# Patient Record
Sex: Female | Born: 1935 | Race: White | Hispanic: No | Smoking: Former smoker
Health system: Southern US, Community
[De-identification: ages and names within clinical notes are randomized; demographics above are authoritative.]

## PROBLEM LIST (undated history)

## (undated) DIAGNOSIS — I10 Essential (primary) hypertension: Secondary | ICD-10-CM

## (undated) DIAGNOSIS — R197 Diarrhea, unspecified: Secondary | ICD-10-CM

## (undated) DIAGNOSIS — M19011 Primary osteoarthritis, right shoulder: Secondary | ICD-10-CM

## (undated) DIAGNOSIS — E669 Obesity, unspecified: Secondary | ICD-10-CM

## (undated) DIAGNOSIS — M25569 Pain in unspecified knee: Secondary | ICD-10-CM

## (undated) DIAGNOSIS — L408 Other psoriasis: Secondary | ICD-10-CM

## (undated) DIAGNOSIS — D649 Anemia, unspecified: Secondary | ICD-10-CM

## (undated) DIAGNOSIS — M4120 Other idiopathic scoliosis, site unspecified: Secondary | ICD-10-CM

## (undated) DIAGNOSIS — IMO0001 Reserved for inherently not codable concepts without codable children: Secondary | ICD-10-CM

## (undated) DIAGNOSIS — M1990 Unspecified osteoarthritis, unspecified site: Secondary | ICD-10-CM

## (undated) DIAGNOSIS — E039 Hypothyroidism, unspecified: Secondary | ICD-10-CM

## (undated) DIAGNOSIS — D126 Benign neoplasm of colon, unspecified: Secondary | ICD-10-CM

## (undated) DIAGNOSIS — E785 Hyperlipidemia, unspecified: Secondary | ICD-10-CM

## (undated) DIAGNOSIS — R609 Edema, unspecified: Secondary | ICD-10-CM

## (undated) DIAGNOSIS — G5600 Carpal tunnel syndrome, unspecified upper limb: Secondary | ICD-10-CM

## (undated) DIAGNOSIS — M542 Cervicalgia: Secondary | ICD-10-CM

## (undated) DIAGNOSIS — L02519 Cutaneous abscess of unspecified hand: Secondary | ICD-10-CM

## (undated) DIAGNOSIS — H6120 Impacted cerumen, unspecified ear: Secondary | ICD-10-CM

## (undated) DIAGNOSIS — K219 Gastro-esophageal reflux disease without esophagitis: Secondary | ICD-10-CM

## (undated) DIAGNOSIS — R112 Nausea with vomiting, unspecified: Secondary | ICD-10-CM

## (undated) DIAGNOSIS — N3941 Urge incontinence: Secondary | ICD-10-CM

## (undated) DIAGNOSIS — E1165 Type 2 diabetes mellitus with hyperglycemia: Secondary | ICD-10-CM

## (undated) DIAGNOSIS — Z79899 Other long term (current) drug therapy: Secondary | ICD-10-CM

## (undated) DIAGNOSIS — I4891 Unspecified atrial fibrillation: Secondary | ICD-10-CM

## (undated) DIAGNOSIS — J4489 Other specified chronic obstructive pulmonary disease: Secondary | ICD-10-CM

## (undated) DIAGNOSIS — K649 Unspecified hemorrhoids: Secondary | ICD-10-CM

## (undated) DIAGNOSIS — L03019 Cellulitis of unspecified finger: Secondary | ICD-10-CM

## (undated) DIAGNOSIS — M545 Low back pain, unspecified: Secondary | ICD-10-CM

## (undated) DIAGNOSIS — T7840XA Allergy, unspecified, initial encounter: Secondary | ICD-10-CM

## (undated) DIAGNOSIS — Z9889 Other specified postprocedural states: Secondary | ICD-10-CM

## (undated) DIAGNOSIS — N6019 Diffuse cystic mastopathy of unspecified breast: Secondary | ICD-10-CM

## (undated) DIAGNOSIS — Z952 Presence of prosthetic heart valve: Secondary | ICD-10-CM

## (undated) DIAGNOSIS — R011 Cardiac murmur, unspecified: Secondary | ICD-10-CM

## (undated) DIAGNOSIS — I509 Heart failure, unspecified: Secondary | ICD-10-CM

## (undated) DIAGNOSIS — J449 Chronic obstructive pulmonary disease, unspecified: Secondary | ICD-10-CM

## (undated) DIAGNOSIS — J45909 Unspecified asthma, uncomplicated: Secondary | ICD-10-CM

## (undated) DIAGNOSIS — Z7901 Long term (current) use of anticoagulants: Secondary | ICD-10-CM

## (undated) HISTORY — DX: Hyperlipidemia, unspecified: E78.5

## (undated) HISTORY — DX: Pain in unspecified knee: M25.569

## (undated) HISTORY — DX: Carpal tunnel syndrome, unspecified upper limb: G56.00

## (undated) HISTORY — DX: Reserved for inherently not codable concepts without codable children: IMO0001

## (undated) HISTORY — DX: Obesity, unspecified: E66.9

## (undated) HISTORY — DX: Diarrhea, unspecified: R19.7

## (undated) HISTORY — PX: BREAST EXCISIONAL BIOPSY: SUR124

## (undated) HISTORY — DX: Hypothyroidism, unspecified: E03.9

## (undated) HISTORY — DX: Chronic obstructive pulmonary disease, unspecified: J44.9

## (undated) HISTORY — DX: Unspecified osteoarthritis, unspecified site: M19.90

## (undated) HISTORY — DX: Essential (primary) hypertension: I10

## (undated) HISTORY — DX: Benign neoplasm of colon, unspecified: D12.6

## (undated) HISTORY — PX: TRIGGER FINGER RELEASE: SHX641

## (undated) HISTORY — DX: Cervicalgia: M54.2

## (undated) HISTORY — PX: BREAST SURGERY: SHX581

## (undated) HISTORY — DX: Other psoriasis: L40.8

## (undated) HISTORY — DX: Type 2 diabetes mellitus with hyperglycemia: E11.65

## (undated) HISTORY — DX: Low back pain, unspecified: M54.50

## (undated) HISTORY — DX: Edema, unspecified: R60.9

## (undated) HISTORY — DX: Unspecified hemorrhoids: K64.9

## (undated) HISTORY — DX: Unspecified asthma, uncomplicated: J45.909

## (undated) HISTORY — DX: Cutaneous abscess of unspecified hand: L02.519

## (undated) HISTORY — DX: Anemia, unspecified: D64.9

## (undated) HISTORY — DX: Cellulitis of unspecified finger: L03.019

## (undated) HISTORY — DX: Unspecified atrial fibrillation: I48.91

## (undated) HISTORY — DX: Urge incontinence: N39.41

## (undated) HISTORY — DX: Low back pain: M54.5

## (undated) HISTORY — DX: Other specified chronic obstructive pulmonary disease: J44.89

## (undated) HISTORY — DX: Diffuse cystic mastopathy of unspecified breast: N60.19

## (undated) HISTORY — DX: Other long term (current) drug therapy: Z79.899

## (undated) HISTORY — PX: OTHER SURGICAL HISTORY: SHX169

## (undated) HISTORY — DX: Allergy, unspecified, initial encounter: T78.40XA

## (undated) HISTORY — PX: COLON SURGERY: SHX602

## (undated) HISTORY — DX: Other idiopathic scoliosis, site unspecified: M41.20

## (undated) HISTORY — DX: Impacted cerumen, unspecified ear: H61.20

## (undated) HISTORY — DX: Long term (current) use of anticoagulants: Z79.01

## (undated) HISTORY — PX: BREAST BIOPSY: SHX20

## (undated) HISTORY — DX: Cardiac murmur, unspecified: R01.1

---

## 1965-10-27 HISTORY — PX: OTHER SURGICAL HISTORY: SHX169

## 1985-10-27 HISTORY — PX: ABDOMINAL HYSTERECTOMY: SHX81

## 1985-11-27 HISTORY — PX: OTHER SURGICAL HISTORY: SHX169

## 1987-10-28 HISTORY — PX: FOOT SURGERY: SHX648

## 1990-10-27 HISTORY — PX: CHOLECYSTECTOMY: SHX55

## 1998-12-28 ENCOUNTER — Ambulatory Visit (HOSPITAL_BASED_OUTPATIENT_CLINIC_OR_DEPARTMENT_OTHER): Admission: RE | Admit: 1998-12-28 | Discharge: 1998-12-28 | Payer: Self-pay | Admitting: Otolaryngology

## 2000-05-01 ENCOUNTER — Other Ambulatory Visit: Admission: RE | Admit: 2000-05-01 | Discharge: 2000-05-01 | Payer: Self-pay | Admitting: Internal Medicine

## 2001-04-30 ENCOUNTER — Encounter: Admission: RE | Admit: 2001-04-30 | Discharge: 2001-04-30 | Payer: Self-pay | Admitting: Orthopedic Surgery

## 2001-04-30 ENCOUNTER — Encounter: Payer: Self-pay | Admitting: Orthopedic Surgery

## 2001-09-22 ENCOUNTER — Encounter: Payer: Self-pay | Admitting: Orthopedic Surgery

## 2001-09-28 ENCOUNTER — Observation Stay (HOSPITAL_COMMUNITY): Admission: RE | Admit: 2001-09-28 | Discharge: 2001-09-29 | Payer: Self-pay | Admitting: Orthopedic Surgery

## 2002-03-01 ENCOUNTER — Encounter: Admission: RE | Admit: 2002-03-01 | Discharge: 2002-03-01 | Payer: Self-pay | Admitting: Internal Medicine

## 2002-03-01 ENCOUNTER — Encounter: Payer: Self-pay | Admitting: Internal Medicine

## 2002-04-26 ENCOUNTER — Encounter: Payer: Self-pay | Admitting: Orthopedic Surgery

## 2002-04-26 ENCOUNTER — Encounter: Admission: RE | Admit: 2002-04-26 | Discharge: 2002-04-26 | Payer: Self-pay | Admitting: Orthopedic Surgery

## 2002-12-05 ENCOUNTER — Ambulatory Visit (HOSPITAL_COMMUNITY): Admission: RE | Admit: 2002-12-05 | Discharge: 2002-12-05 | Payer: Self-pay | Admitting: *Deleted

## 2002-12-05 ENCOUNTER — Encounter (INDEPENDENT_AMBULATORY_CARE_PROVIDER_SITE_OTHER): Payer: Self-pay | Admitting: Specialist

## 2002-12-11 ENCOUNTER — Inpatient Hospital Stay (HOSPITAL_COMMUNITY): Admission: EM | Admit: 2002-12-11 | Discharge: 2002-12-13 | Payer: Self-pay | Admitting: Emergency Medicine

## 2002-12-14 ENCOUNTER — Inpatient Hospital Stay (HOSPITAL_COMMUNITY): Admission: EM | Admit: 2002-12-14 | Discharge: 2002-12-16 | Payer: Self-pay | Admitting: *Deleted

## 2003-03-06 ENCOUNTER — Encounter: Admission: RE | Admit: 2003-03-06 | Discharge: 2003-03-06 | Payer: Self-pay | Admitting: Internal Medicine

## 2003-03-06 ENCOUNTER — Encounter: Payer: Self-pay | Admitting: Internal Medicine

## 2003-05-28 HISTORY — PX: NEUROPLASTY / TRANSPOSITION MEDIAN NERVE AT CARPAL TUNNEL BILATERAL: SUR894

## 2004-03-27 ENCOUNTER — Encounter: Admission: RE | Admit: 2004-03-27 | Discharge: 2004-03-27 | Payer: Self-pay | Admitting: Internal Medicine

## 2005-05-02 ENCOUNTER — Encounter: Admission: RE | Admit: 2005-05-02 | Discharge: 2005-05-02 | Payer: Self-pay | Admitting: Internal Medicine

## 2006-07-14 ENCOUNTER — Encounter: Admission: RE | Admit: 2006-07-14 | Discharge: 2006-07-14 | Payer: Self-pay | Admitting: Internal Medicine

## 2007-07-19 ENCOUNTER — Encounter: Admission: RE | Admit: 2007-07-19 | Discharge: 2007-07-19 | Payer: Self-pay | Admitting: Internal Medicine

## 2008-09-15 ENCOUNTER — Encounter: Admission: RE | Admit: 2008-09-15 | Discharge: 2008-09-15 | Payer: Self-pay | Admitting: Internal Medicine

## 2009-01-18 ENCOUNTER — Encounter: Admission: RE | Admit: 2009-01-18 | Discharge: 2009-01-18 | Payer: Self-pay | Admitting: Orthopedic Surgery

## 2009-10-09 ENCOUNTER — Encounter: Admission: RE | Admit: 2009-10-09 | Discharge: 2009-10-09 | Payer: Self-pay | Admitting: Internal Medicine

## 2009-10-17 ENCOUNTER — Encounter: Admission: RE | Admit: 2009-10-17 | Discharge: 2009-10-17 | Payer: Self-pay | Admitting: Internal Medicine

## 2009-10-27 HISTORY — PX: BREAST EXCISIONAL BIOPSY: SUR124

## 2009-11-12 ENCOUNTER — Encounter: Admission: RE | Admit: 2009-11-12 | Discharge: 2009-11-12 | Payer: Self-pay | Admitting: Surgery

## 2009-11-12 ENCOUNTER — Ambulatory Visit (HOSPITAL_COMMUNITY): Admission: RE | Admit: 2009-11-12 | Discharge: 2009-11-12 | Payer: Self-pay | Admitting: Surgery

## 2010-01-04 ENCOUNTER — Encounter: Admission: RE | Admit: 2010-01-04 | Discharge: 2010-01-04 | Payer: Self-pay | Admitting: Internal Medicine

## 2010-10-23 ENCOUNTER — Encounter
Admission: RE | Admit: 2010-10-23 | Discharge: 2010-10-23 | Payer: Self-pay | Source: Home / Self Care | Attending: Internal Medicine | Admitting: Internal Medicine

## 2010-11-17 ENCOUNTER — Encounter: Payer: Self-pay | Admitting: Internal Medicine

## 2010-11-27 ENCOUNTER — Encounter: Payer: Self-pay | Admitting: Internal Medicine

## 2011-01-12 LAB — CBC
HCT: 37.1 % (ref 36.0–46.0)
Hemoglobin: 12.6 g/dL (ref 12.0–15.0)
MCHC: 34.1 g/dL (ref 30.0–36.0)
MCV: 87.2 fL (ref 78.0–100.0)
Platelets: 300 10*3/uL (ref 150–400)
RBC: 4.25 MIL/uL (ref 3.87–5.11)
RDW: 14.9 % (ref 11.5–15.5)
WBC: 9.7 10*3/uL (ref 4.0–10.5)

## 2011-01-12 LAB — DIFFERENTIAL
Basophils Absolute: 0.1 10*3/uL (ref 0.0–0.1)
Basophils Relative: 1 % (ref 0–1)
Eosinophils Absolute: 0.2 10*3/uL (ref 0.0–0.7)
Eosinophils Relative: 2 % (ref 0–5)
Lymphocytes Relative: 31 % (ref 12–46)
Lymphs Abs: 3 10*3/uL (ref 0.7–4.0)
Monocytes Absolute: 0.8 10*3/uL (ref 0.1–1.0)
Monocytes Relative: 8 % (ref 3–12)
Neutro Abs: 5.7 10*3/uL (ref 1.7–7.7)
Neutrophils Relative %: 59 % (ref 43–77)

## 2011-01-12 LAB — COMPREHENSIVE METABOLIC PANEL
ALT: 49 U/L — ABNORMAL HIGH (ref 0–35)
AST: 44 U/L — ABNORMAL HIGH (ref 0–37)
Albumin: 4.4 g/dL (ref 3.5–5.2)
Alkaline Phosphatase: 66 U/L (ref 39–117)
BUN: 13 mg/dL (ref 6–23)
CO2: 29 mEq/L (ref 19–32)
Calcium: 10 mg/dL (ref 8.4–10.5)
Chloride: 98 mEq/L (ref 96–112)
Creatinine, Ser: 0.73 mg/dL (ref 0.4–1.2)
GFR calc Af Amer: >60 mL/min (ref >60)
GFR calc non Af Amer: >60 mL/min (ref >60)
Glucose, Bld: 129 mg/dL — ABNORMAL HIGH (ref 70–99)
Potassium: 4.3 mEq/L (ref 3.5–5.1)
Sodium: 141 mEq/L (ref 135–145)
Total Bilirubin: 0.5 mg/dL (ref 0.3–1.2)
Total Protein: 7.7 g/dL (ref 6.0–8.3)

## 2011-01-13 LAB — GLUCOSE, CAPILLARY
Glucose-Capillary: 150 mg/dL — ABNORMAL HIGH (ref 70–99)
Glucose-Capillary: 154 mg/dL — ABNORMAL HIGH (ref 70–99)

## 2011-03-14 NOTE — Op Note (Signed)
   NAME:  Patricia [name], Patricia [name]                          ACCOUNT NO.:  [id number]   MEDICAL RECORD NO.:  [id number]                   PATIENT TYPE:  AMB   LOCATION:  ENDO                                 FACILITY:  [facility]   PHYSICIAN:  Waverly Ferrari, M.D.                 DATE OF BIRTH:  [birth year]   DATE OF PROCEDURE:  12/05/2002  DATE OF DISCHARGE:                                 OPERATIVE REPORT   PROCEDURE:  Upper endoscopy.   INDICATION:  GERD.   ANESTHESIA:  Demerol 80 mg, Versed 7 mg.   DESCRIPTION OF PROCEDURE:  With the patient mildly sedated in the left  lateral decubitus position, the Olympus videoscopic endoscope was inserted  into the mouth and passed under direct vision through the esophagus which  appeared normal until it reached the distal esophagus where there were  changes of Barrett's photographed and biopsied.  When entering the stomach,  the fundus showed changes with multiple polyps that were photographed and  biopsied.  The body and antrum appeared normal, as did the duodenal bulb and  second portion of the duodenum.  From this point, the endoscope was slowly  withdrawn, taking circumferential views of the duodenal mucosa until the  endoscope was pulled into the stomach and placed in retroflexion, viewing  the stomach from below and this showed an incomplete wrap at the GE junction  around the endoscope that was photographed.  The endoscope was straightened  and withdrawn, taking circumferential views of the remaining gastric and  esophageal mucosa.  The patient's vital signs and pulse oximetry remained  stable, and the patient tolerated the procedure well without apparent  complications.   FINDINGS:  Changes with what appears to be Barrett's esophagus, distal  esophagus biopsied and multiple gastric fundic polyps, biopsied, awaiting  biopsy report.   PLAN:  The patient will call me for results and follow up with me as an  outpatient and proceed to colonoscopy as  planned.                                               Waverly Ferrari, M.D.    GMO/MEDQ  D:  12/05/2002  T:  12/05/2002  Job:  [id number]

## 2011-03-14 NOTE — Discharge Summary (Signed)
   NAME:  KEONNA, RAETHER                          ACCOUNT NO.:  [id number]   MEDICAL RECORD NO.:  [id number]                   PATIENT TYPE:  INP   LOCATION:  0372                                 FACILITY:  [facility]   PHYSICIAN:  Waverly Ferrari, M.D.                 DATE OF BIRTH:  1936-08-13   DATE OF ADMISSION:  12/14/2002  DATE OF DISCHARGE:  12/16/2002                                 DISCHARGE SUMMARY   ADMISSION DIAGNOSES:  Gastrointestinal bleeding.   HISTORY OF PRESENT ILLNESS:  The patient gave a history of having two bloody  stools in succession.  Came to the emergency room and was admitted.  Vital  signs were stable.  H&H really was unchanged from her previous admission  from discharge one day previously.  During the hospitalization she had a  normal bowel movement, no abdominal pain, and no further passage of blood  per rectum.  Vital signs and her H&H remained stable and therefore it was  elected to discharge her home with outpatient follow-up.  She was given  octreotide drip overnight from admission but that was discontinued the next  day and she remained stable despite being off that.  At the time of  discharge her vital signs, blood pressure most recently was 154/68, pulse  70, respiratory rate 18.  Her CBGs remained in the 125-180 range.  At the  time of discharge her most recent H&H showed a hemoglobin of 11, hematocrit  of 32.  She was discharged in stable condition with outpatient follow-up  suggested with me already scheduled for March 11.                                               Waverly Ferrari, M.D.    GMO/MEDQ  D:  12/16/2002  T:  12/16/2002  Job:  [id number]

## 2011-03-14 NOTE — Op Note (Signed)
   NAME:  Patricia [name], Patricia [name] NO.:  [id number]   MEDICAL RECORD NO.:  [id number]                   PATIENT TYPE:  AMB   LOCATION:  ENDO                                 FACILITY:  [facility]   PHYSICIAN:  Waverly Ferrari, M.D.                 DATE OF BIRTH:  October 25, 1936   DATE OF PROCEDURE:  DATE OF DISCHARGE:                                 OPERATIVE REPORT                                               Waverly Ferrari, M.D.    GMO/MEDQ  D:  12/05/2002  T:  12/05/2002  Job:  [id number]   cc:   Viviann Spare. Nyoka Cowden, M.D.  [address]  [place]   [address]  Fax: [phone]

## 2011-03-14 NOTE — Op Note (Signed)
   NAME:  Patricia [name], Patricia [name]                          ACCOUNT NO.:  [id number]   MEDICAL RECORD NO.:  [id number]                   PATIENT TYPE:  INP   LOCATION:  0372                                 FACILITY:  [facility]   PHYSICIAN:  Waverly Ferrari, M.D.                 DATE OF BIRTH:  Sep 27, 1936   DATE OF PROCEDURE:  01/24/2003  DATE OF DISCHARGE:  12/16/2002                                 OPERATIVE REPORT   PROCEDURE PERFORMED:  Colonoscopy.   ENDOSCOPIST:  Waverly Ferrari, M.D.   INDICATIONS FOR PROCEDURE:  Colon polyps.   ANESTHESIA:  Demerol 40 mg, Versed 4 mg.   DESCRIPTION OF PROCEDURE:  With the patient mildly sedated in the left  lateral decubitus position, the Olympus video colonoscope was inserted in  the rectum and passed under direct vision to the cecum, identified by the  ileocecal valve and appendiceal orifice, both of which were photographed.  From this point the colonoscope was slowly withdrawn taking circumferential  views of the entire colonic mucosa stopping in the ascending colon where a  small polyp was seen and removed using cautery technique, setting of 20/20  blended current.  Tissue was obtained for pathology and then the colonoscope  was slowly withdrawn taking circumferential views of the remaining colonic  mucosa stopping only in the rectum which appeared normal on direct and  retroflex view.  The endoscope was straightened and withdrawn.  The  patient's vital signs and pulse oximeter remained stable.  The patient  tolerated the procedure well without apparent complications.   FINDINGS:  Ascending colon polyp.   PLAN:  Await pathology report.  Patient will call me for results and follow  up with me as an outpatient.                                                  Waverly Ferrari, M.D.    GMO/MEDQ  D:  01/24/2003  T:  01/24/2003  Job:  [id number]

## 2011-03-14 NOTE — Discharge Summary (Signed)
   NAME:  Patricia [name], Patricia [name] NO.:  [id number]   MEDICAL RECORD NO.:  [id number]                   PATIENT TYPE:  INP   LOCATION:  4825                                 FACILITY:  [facility]   PHYSICIAN:  Waverly Ferrari, M.D.                 DATE OF BIRTH:  1936-10-21   DATE OF ADMISSION:  12/11/2002  DATE OF DISCHARGE:  12/13/2002                                 DISCHARGE SUMMARY   HISTORY:  The patient is a 75 year old lady admitted to the hospital on the  evening of Sunday, 12/11/02, and discharged on 12/13/02.  Chief complaint was  gastrointestinal bleeding.  She had undergone an endoscopy and a colonoscopy  which both showed polyps; the polyp in the colon was in the right side of  the colon.  Her bleeding was bright red in nature and required four units of  blood to be transfused.  She had continued on aspirin inadvertently after  her colonoscopy.  She was admitted to the hospital by Dr. Gatha Mayer,  she was stabilized, and she underwent endoscopy which was negative except  for the previously noted polyps in the fundus and colonoscopy by me,  yesterday 12/12/02, and colonoscopy it was noted that she had a ulcer at the  area of the polypectomy site, as to be expected, but a visible vessel was  noted, it was injected with epinephrine, even though it was not actively  bleeding at the time of the procedure.  The remainder of the colonoscopy was  unremarkable.  The patient remained stable subsequently with no further  bleeding.  She was tolerating a regular diet and therefore she was  discharged home to resume her previous medications which included Cozaar,  Lipitor, Cardizem and Advair Diskus, but we have asked her to hold her  aspirin for another two weeks.  Her polypectomies showed adenomatous polyps  and therefore I suggested a repeat examination in probably 2-3 years, and I  told her to notify her family members of this, the brothers and sisters  should  consider colonoscopy, and children should consider colonoscopy either  at age 1 or for screening of themselves.  The patient was discharged in  improved and stable condition for followup as an outpatient.                                               Waverly Ferrari, M.D.    GMO/MEDQ  D:  12/13/2002  T:  12/13/2002  Job:  [id number]   cc:   Viviann Spare. Nyoka Cowden, M.D.  [address]  [place]  [place] [address]  Fax: [phone]

## 2011-03-14 NOTE — Op Note (Signed)
   NAME:  Patricia [name], Patricia [name]                          ACCOUNT NO.:  [id number]   MEDICAL RECORD NO.:  [id number]                   PATIENT TYPE:  INP   LOCATION:  4008                                 FACILITY:  [facility]   PHYSICIAN:  Waverly Ferrari, M.D.                 DATE OF BIRTH:  06/21/1936   DATE OF PROCEDURE:  DATE OF DISCHARGE:                                 OPERATIVE REPORT   PROCEDURE PERFORMED:  Upper endoscopy.   ENDOSCOPIST:  Waverly Ferrari, M.D.   INDICATIONS FOR PROCEDURE:  GI bleeding.   ANESTHESIA:  Demerol 50 and Versed 6 mg.   DESCRIPTION OF PROCEDURE:  With the patient mildly sedated in the left  lateral decubitus position the Olympus videoscopic endoscope was inserted in  the mouth down to the stomach under direct vision through the esophagus,  which appeared normal into the stomach.  Fundus, body, antrum, duodenal  bulb, and second portion of the duodenum were all visualized.  From this  point the endoscope was slowly withdrawn taking circumferential views of the  duodenal mucosa.  The endoscope was then pulled back into the stomach and  placed in retroflex to view the stomach from below.  The endoscope was then  straightened and withdrawn taking circumferential views of the remaining  gastric and esophageal mucosa.   The patient's vital signs and pulse oximeter remained stable.  The patient  tolerated the procedure well without apparent complications.   FINDINGS:  Unremarkable endoscopic examination.   PLAN:  Proceed to colonoscopy.                                                 Waverly Ferrari, M.D.    GMO/MEDQ  D:  12/12/2002  T:  12/12/2002  Job:  [id number]

## 2011-03-14 NOTE — Op Note (Signed)
   NAME:  Patricia [name], Patricia [name]                          ACCOUNT NO.:  [id number]   MEDICAL RECORD NO.:  [id number]                   PATIENT TYPE:  INP   LOCATION:  7342                                 FACILITY:  [facility]   PHYSICIAN:  Waverly Ferrari, M.D.                 DATE OF BIRTH:  1936/07/16   DATE OF PROCEDURE:  12/12/2002  DATE OF DISCHARGE:                                 OPERATIVE REPORT   PROCEDURE:  Colonoscopy.   INDICATIONS FOR PROCEDURE:  GI bleed.   ANESTHESIA:  Demerol 50 mg, Versed 4 mg additionally.   PROCEDURE:  With the patient mildly sedated in the left lateral decubitus  position, the Olympus videoscopic colonoscope was inserted into the rectum  and passed under direct vision to the cecum identified by ileocecal valve  and appendiceal orifice, both of which were photographed.  We entered into  the terminal ileum which also appeared normal and was photographed.  From  this point, the colonoscope was then slowly withdrawn taking circumferential  views of the remaining colonic mucosa stopping only in the right colon where  a previous polypectomy was identified and there was an ulcer associated with  this as to be expected and with it, a visible vessel was noted.  This was  injected with an mL on either side of the vessel and then an mL on the  proximal and distal position of the ulcer with good blanching.  There was no  bleeding seen at this time.  Therefore, the endoscope was withdrawn taking  circumferential views of the remaining colonic mucosa until the endoscope  had been withdrawn.  The patient's vital signs and pulse oximetry remained  stable.  The patient tolerated the procedure well without apparent  complications.   FINDINGS:  Ulceration of right colon, as described.   PLAN:  Will increase patient's diet and follow up clinically.                                               Waverly Ferrari, M.D.    GMO/MEDQ  D:  12/12/2002  T:  12/12/2002  Job:   [id number]

## 2011-03-14 NOTE — Op Note (Signed)
[facility]  Patient:    Patricia [name], Patricia [name] Visit Number: [id number] MRN: [id number]          Service Type: SUR Location: 4W 4920 02 Attending Physician:  Tarri Glenn Page Dictated by:   Laurice Record. Aplington, M.D. Proc. Date: 09/28/01 Admit Date:  09/28/2001                             Operative Report  PREOPERATIVE DIAGNOSES: 1. Painful degenerative arthritis left acromioclavicular joint. 2. Chronic impingement syndrome with suspected partial rotator cuff tear, left    shoulder.  POSTOPERATIVE DIAGNOSES: 1. Glenohumeral arthritis. 2. Chronic impingement syndrome with partial rotator cuff tear. 3. Painful degenerative arthritis left acromioclavicular joint.  OPERATION: 1. Left shoulder arthroscopy with shaving of humeral head and glenoid. 2. Arthroscopic subacromial decompression. 3. Open resection distal clavicle, left shoulder.  SURGEON:  Laurice Record. Aplington, M.D.  ASSISTANT:  Alexzandrew L. Perkins, P.A.-C.  ANESTHESIA:  General.  PATHOLOGY AND JUSTIFICATION OF PROCEDURE:  Chronic pain in the left shoulder with tendinous at the [facility] joint and also the subacromial area with only partial relief from injection.  She, because of claustrophobia problems, did not want to have an MRI done, and we did get an arthrogram which demonstrated no full-thickness rotator cuff tear.  Because of the continued pain, she is here today for the above-mentioned surgery.  See operative description below for additional pathology and details.  DESCRIPTION OF PROCEDURE:  Satisfactory general anesthesia, preceded by intrascalene block by anesthesiologist.  She was placed on the Schlein frame in the beach chair position.  The left shoulder girdle was prepped with DuraPrep and draped in a sterile field.  The anatomy of the shoulder joint was marked out including the [facility] joint, a lateral portal, and the posterior soft spot.  The lateral portal, posterior soft spot, and  subacromial area, we infiltrated with 0.5% Marcaine with adrenalin, and I then, through a small stab wound in the posterior soft spot with a blunt trocar, was able to atraumatically enter the glenohumeral joint and inspect it.  The long head of the biceps tendon and underneath surface of the rotator cuff looked normal. Her major pathology was a good bit of wear on the humeral head with areas of articular cartilage that were partially detached as well as some gouged out areas of the glenoid.  Because of this, I elected to go ahead and debride up the joint and advanced the scope between the long head of the biceps and subscapularis tendons, used a switching stick, and a small anterior stab wound, and then placed a metal cannula over the switching stick and brought it in the joint anteriorly followed by the 4.2 shaver.  I then shaved down slightly the labrum but mainly the humeral head and smoothing it down as much as possible as well as the glenoid which did not require much in the way of shaving.  After this had been completed, I evacuated fluid from the glenohumeral joint and redirected the scope into the subacromial area, anterolateral portal, introduced the 4.2 shaver.  She had a good bit of bursitis present which I resected with the shaver and once I had adequate visualization, introduced the Arthrocare vaporizer and then began removing the soft tissue on the underneath surface of the acromion which was not particularly beat and around the anterior acromion.  Once I had adequate soft tissue resection, I introduced a 4.0 oval bur and then  began burring down the underneath surface of the acromion until we had wide decompression.  Also shaved down the rotator cuff with the 4.2 shaver.  When the decompression had been completed, I took pictures with the arm to the side and the arm abducted. I then evacuated fluid from this joint as well and made an open incision over the distal clavicle,  exposing it in subperiosteal fashion.  I measured out slightly less than 2 cm from the joint and then undermined the clavicle at this point and protected the underlying structures and with a microsaw, resected the bone at this point.  I then grasped the distal clavicle with towel clip and freed it up with cautery dissection.  Several small bleeders were then coagulated.  I also removed some small spicules of bone on the underneath surface of the parent clavicle followed by bone wax.  I then packed the interval of the bone resection with Gelfoam.  The fascia overlying the clavicle and resected area was then reapproximated with interrupted #1 Vicryl, subcutaneous tissue with 2-0 Vicryl, and Steri-Strips on the skin with 4-0 nylon in the three portals.  Dry sterile dressing and shoulder immobilizer were applied.  She tolerated the procedure well and was taken to the recovery room in satisfactory condition with no known complications. Dictated by:   Laurice Record. Aplington, M.D. Attending Physician:  Clare Gandy DD:  09/28/01 TD:  09/28/01 Job: [id number]

## 2011-03-14 NOTE — H&P (Signed)
NAME:  Patricia, [name] NO.:  [id number]   MEDICAL RECORD NO.:  [id number]                   PATIENT TYPE:  INP   LOCATION:  0102                                 FACILITY:  [facility]   PHYSICIAN:  Gatha Mayer, M.D. [facility]           DATE OF BIRTH:  11-12-1935   DATE OF ADMISSION:  DATE OF DISCHARGE:                                HISTORY & PHYSICAL   CHIEF COMPLAINT:  Rectal bleeding.   HISTORY OF PRESENT ILLNESS:  Patricia [name] is a very nice 75 year old white female  known to Dr. Jeanmarie Hubert and Dr. Lajoyce Corners with history of hypertension,  hyperlipidemia, and asthma who underwent upper endoscopy and colonoscopy  with Dr. Lajoyce Corners on December 05, 2002.  I believe this was done for heme-positive  stool.  She was found to have a single polyp in the right colon which was  removed and had multiple biopsies of small gastric polyps done as well as  additional esophageal biopsy for rule out Barrett's.  The patient did fine  with the procedure and had done well until last p.m.  She says she restarted  her baby aspirin one per day a couple of days ago and reports that she was  not told to stay off aspirin and NSAIDs.  She had onset about 4 a.m. with urgency for bowel movement which woke her  from sleep and then filled the commode with red blood and a few clots.  She  quickly had four to five further episodes at home, became lightheaded, no  syncope, fall, chest pain, shortness of breath, etc.  She called EMS and was  transported to the ER.  On arrival to her home, EMS recorded blood pressure  of 180/80.  She was orthostatic here in the ER and with initial hemoglobin  of 10.3.  She has had six to eight episodes since arrival in the ER and says  that the last episode was less blood and more clotted.  Currently she is  hemodynamically stable, still feeling a bit lightheaded.   CURRENT MEDICATIONS:  1. Baby aspirin one p.o. daily.  2. Cozaar daily.  3. Lipitor daily.  4. Cardizem  daily.  5. Advair Diskus b.i.d.  She is uncertain of the dose of this.   ALLERGIES:  No known drug allergies.   PAST MEDICAL HISTORY:  Pertinent for hypertension, hyperlipidemia, asthma,  breast biopsy x 2, hysterectomy, cholecystectomy, left shoulder repair, and  laminectomy.   FAMILY HISTORY:  A sister deceased with stomach cancer and a sister  deceased with liver cancer.  Family history also pertinent for diabetes and  CVA.   SOCIAL HISTORY:  The patient lives alone.  She is employed full time as a  Market researcher.  She does have one daughter who lives in [place].  No  tobacco or no EtOH.   REVIEW OF SYSTEMS:  CARDIOVASCULAR:  Denies any chest pain or anginal  symptoms.  PULMONARY:  Negative for cough, shortness of breath, sputum  production.  GU:  Negative for discharge, dysuria or frequency.  GI:  As  above.   PHYSICAL EXAMINATION:  GENERAL:  Well-developed white female in no acute  distress.  She is pale, alert and oriented x 3.  VITAL SIGNS:  Blood pressure 130/90, pulse 110.  HEENT:  Atraumatic, normocephalic.  EOMI.  PERRLA.  Sclerae anicteric.  Conjunctivae somewhat pale.  NECK:  Supple without nodes.  CARDIOVASCULAR:  Slightly tachy regular rhythm with S1 and S2.  PULMONARY:  Clear to A&P.  ABDOMEN:  Soft.  Bowel sounds active.  She is mildly tender in the right mid  quadrant.  There is no guarding or rebound.  No mass or hepatosplenomegaly.  RECTAL:  Not done at this time.  Done per ER doctor and grossly bloody.  EXTREMITIES:  No clubbing, cyanosis or edema.  NEUROLOGIC:  Grossly nonfocal.   LABORATORY DATA:  WBC 8.2, hemoglobin 10.4, hematocrit 30.4, MCV 85,  platelets 269.  Glucose 205, albumin 3.4.   IMPRESSION:  61. A 75 year old white female with acute post polypectomy bleed about six     days post colonoscopy and upper endoscopy with major bleed at this time,     orthostasis and anemia.  Suspect source is the right colon polyp site.     She did have  multiple gastric polyps biopsied and biopsy of the distal     esophagus also.  2. History of hypertension.  3. Hyperlipidemia.  4. Status post cholecystectomy, hysterectomy, laminectomy, and left shoulder     repair.  5. Asthma.  6. Rule out diabetes.   PLAN:  The patient is admitted to the service of Dr. Silvano Rusk to  stepdown or telemetry.  Volume replaced, transfused to keep hemoglobin 9,  bed rest, serial H&H.  She may require rapid purging colonoscopy later today  if she continues to actively hemorrhage.  Also note elevated glucose which  was fasting and will repeat fasting glucose in the a.m. and check hemoglobin  A1c.  For details, please see the orders.        Nicoletta Ba, P.A.-C. [facility]                Gatha Mayer, M.D. [facility]    AE/MEDQ  D:  12/11/2002  T:  12/11/2002  Job:  [id number]   cc:   Waverly Ferrari, M.D.  [address] [place]  [place] [address]  Fax: [place]. Nyoka Cowden, M.D.  [address]  [place]  [place] [address]  Fax: [phone]

## 2011-06-11 ENCOUNTER — Other Ambulatory Visit: Payer: Self-pay | Admitting: Internal Medicine

## 2011-06-11 DIAGNOSIS — Z780 Asymptomatic menopausal state: Secondary | ICD-10-CM

## 2011-06-20 ENCOUNTER — Ambulatory Visit
Admission: RE | Admit: 2011-06-20 | Discharge: 2011-06-20 | Disposition: A | Discharge status: Home / Self Care | Payer: Medicare Other | Source: Ambulatory Visit | Attending: Internal Medicine | Admitting: Internal Medicine

## 2011-06-20 DIAGNOSIS — Z780 Asymptomatic menopausal state: Secondary | ICD-10-CM

## 2011-10-08 ENCOUNTER — Other Ambulatory Visit: Payer: Self-pay | Admitting: Internal Medicine

## 2011-10-08 DIAGNOSIS — Z1231 Encounter for screening mammogram for malignant neoplasm of breast: Secondary | ICD-10-CM

## 2011-11-05 ENCOUNTER — Ambulatory Visit: Payer: Medicare Other

## 2011-12-01 DIAGNOSIS — E119 Type 2 diabetes mellitus without complications: Secondary | ICD-10-CM | POA: Diagnosis not present

## 2011-12-09 ENCOUNTER — Ambulatory Visit
Admission: RE | Admit: 2011-12-09 | Discharge: 2011-12-09 | Disposition: A | Discharge status: Home / Self Care | Payer: Medicare Other | Source: Ambulatory Visit | Attending: Internal Medicine | Admitting: Internal Medicine

## 2011-12-09 DIAGNOSIS — Z1231 Encounter for screening mammogram for malignant neoplasm of breast: Secondary | ICD-10-CM | POA: Diagnosis not present

## 2012-02-16 DIAGNOSIS — I10 Essential (primary) hypertension: Secondary | ICD-10-CM | POA: Diagnosis not present

## 2012-02-16 DIAGNOSIS — IMO0001 Reserved for inherently not codable concepts without codable children: Secondary | ICD-10-CM | POA: Diagnosis not present

## 2012-02-18 DIAGNOSIS — E669 Obesity, unspecified: Secondary | ICD-10-CM | POA: Diagnosis not present

## 2012-02-18 DIAGNOSIS — I10 Essential (primary) hypertension: Secondary | ICD-10-CM | POA: Diagnosis not present

## 2012-02-18 DIAGNOSIS — E119 Type 2 diabetes mellitus without complications: Secondary | ICD-10-CM | POA: Diagnosis not present

## 2012-02-18 DIAGNOSIS — E039 Hypothyroidism, unspecified: Secondary | ICD-10-CM | POA: Diagnosis not present

## 2012-05-19 DIAGNOSIS — E119 Type 2 diabetes mellitus without complications: Secondary | ICD-10-CM | POA: Diagnosis not present

## 2012-05-19 DIAGNOSIS — Z86010 Personal history of colonic polyps: Secondary | ICD-10-CM | POA: Diagnosis not present

## 2012-06-14 DIAGNOSIS — I359 Nonrheumatic aortic valve disorder, unspecified: Secondary | ICD-10-CM | POA: Diagnosis not present

## 2012-06-14 DIAGNOSIS — Z0389 Encounter for observation for other suspected diseases and conditions ruled out: Secondary | ICD-10-CM | POA: Diagnosis not present

## 2012-06-14 DIAGNOSIS — I10 Essential (primary) hypertension: Secondary | ICD-10-CM | POA: Diagnosis not present

## 2012-06-14 DIAGNOSIS — E785 Hyperlipidemia, unspecified: Secondary | ICD-10-CM | POA: Diagnosis not present

## 2012-06-14 DIAGNOSIS — J45909 Unspecified asthma, uncomplicated: Secondary | ICD-10-CM | POA: Diagnosis not present

## 2012-06-14 DIAGNOSIS — E119 Type 2 diabetes mellitus without complications: Secondary | ICD-10-CM | POA: Diagnosis not present

## 2012-08-23 DIAGNOSIS — D126 Benign neoplasm of colon, unspecified: Secondary | ICD-10-CM | POA: Diagnosis not present

## 2012-08-23 DIAGNOSIS — Z86010 Personal history of colonic polyps: Secondary | ICD-10-CM | POA: Diagnosis not present

## 2012-08-23 DIAGNOSIS — Z1211 Encounter for screening for malignant neoplasm of colon: Secondary | ICD-10-CM | POA: Diagnosis not present

## 2012-08-23 DIAGNOSIS — K5730 Diverticulosis of large intestine without perforation or abscess without bleeding: Secondary | ICD-10-CM | POA: Diagnosis not present

## 2012-08-23 LAB — HM COLONOSCOPY

## 2012-10-07 ENCOUNTER — Ambulatory Visit (INDEPENDENT_AMBULATORY_CARE_PROVIDER_SITE_OTHER): Payer: Medicare Other | Admitting: Family Medicine

## 2012-10-07 VITALS — BP 128/64 | HR 64 | Temp 98.1°F | Resp 16 | Ht 62.0 in | Wt 213.0 lb

## 2012-10-07 DIAGNOSIS — N390 Urinary tract infection, site not specified: Secondary | ICD-10-CM | POA: Diagnosis not present

## 2012-10-07 LAB — POCT URINALYSIS DIPSTICK
Glucose, UA: NEGATIVE
Ketones, UA: 15
Nitrite, UA: NEGATIVE
Protein, UA: >=300
Spec Grav, UA: >=1.03
Urobilinogen, UA: 0.2
pH, UA: 5.5

## 2012-10-07 LAB — POCT UA - MICROSCOPIC ONLY
Casts, Ur, LPF, POC: NEGATIVE
Crystals, Ur, HPF, POC: NEGATIVE
Yeast, UA: NEGATIVE

## 2012-10-07 MED ORDER — CEPHALEXIN 500 MG PO CAPS: 500.0000 mg | ORAL_CAPSULE | Freq: Three times a day (TID) | ORAL | Status: DC

## 2012-10-07 NOTE — Patient Instructions (Addendum)
Take the antibiotic 3x a day.  Drink plenty of fluids. If you are not better in the next 2 days please call or come back in- Sooner if worse.

## 2012-10-07 NOTE — Progress Notes (Signed)
[facility] [address] [place] [address] [phone]  Date:  10/07/2012   Name:  Patricia [name]   DOB:  [birth year]   MRN:  [id number]  PCP:  No primary provider on file.    Chief Complaint: Back Pain and Urinary Frequency   History of Present Illness:  Patricia [name] is a 76 y.o. very pleasant female patient who presents with the following:  History of DM which is controlled.  She has noted right flank tenderness for the last few days- worse yesterday.  She noted urinary frequency.  No dysuria.  Afraid she has a UTI No fever, chills, nausea or vomiting.   She is eating ok.   No hematuria noted.    There is no problem list on file for this patient.   Past Medical History  Diagnosis Date  . Allergy   . Arthritis   . Asthma   . Diabetes mellitus without complication   . Heart murmur   . Thyroid disease     Past Surgical History  Procedure Date  . Breast surgery     right breast 3 surgeries [phone]  . Cholecystectomy 1992  . Colon surgery     2004,2007,2013.  3 times colon surgieres  . Abdominal hysterectomy 1987    complete    History  Substance Use Topics  . Smoking status: Former Smoker    Quit date: 09/07/1989  . Smokeless tobacco: Not on file  . Alcohol Use: Not on file    History reviewed. No pertinent family history.  Allergies  Allergen Reactions  . Codeine Hives    Medication list has been reviewed and updated.  Current Outpatient Prescriptions on File Prior to Visit  Medication Sig Dispense Refill  . amLODipine (NORVASC) 10 MG tablet Take 10 mg by mouth daily.      . cetirizine (ZYRTEC) 10 MG tablet Take 10 mg by mouth daily.      Marland Kitchen diltiazem (TIAZAC) 360 MG 24 hr capsule Take 360 mg by mouth daily.      . enalapril (VASOTEC) 20 MG tablet Take 20 mg by mouth daily.      . fluticasone-salmeterol (ADVAIR HFA) 45-21 MCG/ACT inhaler Inhale 2 puffs into the lungs 2 (two) times daily.      . furosemide (LASIX)  40 MG tablet Take 40 mg by mouth daily.      Marland Kitchen levothyroxine (SYNTHROID, LEVOTHROID) 25 MCG tablet Take 0.5 mcg by mouth daily.      Marland Kitchen lovastatin (MEVACOR) 40 MG tablet Take 40 mg by mouth at bedtime.      . Potassium 95 MG TABS Take by mouth.      . sitaGLIPtin (JANUVIA) 100 MG tablet Take 100 mg by mouth daily.        Review of Systems:   Physical Examination: Filed Vitals:   10/07/12 1512  BP: 128/64  Pulse: 64  Temp: 98.1 F (36.7 C)  Resp: 16   Filed Vitals:   10/07/12 1512  Height: _0  (1.575 m)  Weight: 213 lb (96.616 kg)   Body mass index is 38.96 kg/(m^2). Ideal Body Weight: Weight in (lb) to have BMI = 25: 136.4   GEN: WDWN, NAD, Non-toxic, A & O x 3 HEENT: Atraumatic, Normocephalic. Neck supple. No masses, No LAD. Ears and Nose: No external deformity. CV: RRR, No M/G/R. No JVD. No thrill. No extra heart sounds. PULM: CTA B, no wheezes, crackles, rhonchi. No retractions. No resp. distress.  No accessory muscle use. ABD: S, NT, ND, +BS. No rebound. No HSM. No CVA tenderness.  No lesion or rash on the right flank EXTR: No c/c/e NEURO Normal gait.  PSYCH: Normally interactive. Conversant. Not depressed or anxious appearing.  Calm demeanor.   Results for orders placed in visit on 10/07/12  POCT UA - MICROSCOPIC ONLY      Component Value Range   WBC, Ur, HPF, POC TNTC     RBC, urine, microscopic TNTC     Bacteria, U Microscopic 4+     Mucus, UA trace     Epithelial cells, urine per micros 2-4     Crystals, Ur, HPF, POC negative     Casts, Ur, LPF, POC negative     Yeast, UA negative    POCT URINALYSIS DIPSTICK      Component Value Range   Color, UA dark yellow     Clarity, UA cloudy     Glucose, UA negative     Bilirubin, UA small     Ketones, UA 15     Spec Grav, UA >=1.030     Blood, UA moderate     pH, UA 5.5     Protein, UA >=300     Urobilinogen, UA 0.2     Nitrite, UA negative     Leukocytes, UA small (1+)       Assessment and Plan: 1. UTI  (lower urinary tract infection)  POCT UA - Microscopic Only, POCT urinalysis dipstick, Urine culture, cephALEXin (KEFLEX) 500 MG capsule   Treat with keflex for UTI, await UCX.  See patient instructions for more details.     Lamar Blinks, MD

## 2012-10-09 LAB — URINE CULTURE: Colony Count: >=100000

## 2012-11-04 DIAGNOSIS — Z23 Encounter for immunization: Secondary | ICD-10-CM | POA: Diagnosis not present

## 2012-11-22 ENCOUNTER — Other Ambulatory Visit: Payer: Self-pay | Admitting: Internal Medicine

## 2012-11-22 DIAGNOSIS — Z1231 Encounter for screening mammogram for malignant neoplasm of breast: Secondary | ICD-10-CM

## 2012-11-30 DIAGNOSIS — E119 Type 2 diabetes mellitus without complications: Secondary | ICD-10-CM | POA: Diagnosis not present

## 2012-11-30 DIAGNOSIS — E039 Hypothyroidism, unspecified: Secondary | ICD-10-CM | POA: Diagnosis not present

## 2012-11-30 DIAGNOSIS — I10 Essential (primary) hypertension: Secondary | ICD-10-CM | POA: Diagnosis not present

## 2012-11-30 DIAGNOSIS — IMO0001 Reserved for inherently not codable concepts without codable children: Secondary | ICD-10-CM | POA: Diagnosis not present

## 2012-11-30 DIAGNOSIS — I060 Rheumatic aortic stenosis: Secondary | ICD-10-CM | POA: Diagnosis not present

## 2012-12-02 DIAGNOSIS — E119 Type 2 diabetes mellitus without complications: Secondary | ICD-10-CM | POA: Diagnosis not present

## 2012-12-20 ENCOUNTER — Ambulatory Visit
Admission: RE | Admit: 2012-12-20 | Discharge: 2012-12-20 | Disposition: A | Discharge status: Home / Self Care | Payer: Medicare Other | Source: Ambulatory Visit | Attending: Internal Medicine | Admitting: Internal Medicine

## 2012-12-20 DIAGNOSIS — Z1231 Encounter for screening mammogram for malignant neoplasm of breast: Secondary | ICD-10-CM | POA: Diagnosis not present

## 2013-01-14 ENCOUNTER — Other Ambulatory Visit: Payer: Self-pay | Admitting: *Deleted

## 2013-01-14 DIAGNOSIS — I10 Essential (primary) hypertension: Secondary | ICD-10-CM

## 2013-01-14 DIAGNOSIS — IMO0001 Reserved for inherently not codable concepts without codable children: Secondary | ICD-10-CM

## 2013-01-14 DIAGNOSIS — E039 Hypothyroidism, unspecified: Secondary | ICD-10-CM

## 2013-02-17 ENCOUNTER — Other Ambulatory Visit: Payer: Self-pay

## 2013-02-17 DIAGNOSIS — Z85828 Personal history of other malignant neoplasm of skin: Secondary | ICD-10-CM | POA: Diagnosis not present

## 2013-02-17 DIAGNOSIS — L821 Other seborrheic keratosis: Secondary | ICD-10-CM | POA: Diagnosis not present

## 2013-02-17 DIAGNOSIS — L723 Sebaceous cyst: Secondary | ICD-10-CM | POA: Diagnosis not present

## 2013-02-17 DIAGNOSIS — D1801 Hemangioma of skin and subcutaneous tissue: Secondary | ICD-10-CM | POA: Diagnosis not present

## 2013-02-17 DIAGNOSIS — B079 Viral wart, unspecified: Secondary | ICD-10-CM | POA: Diagnosis not present

## 2013-02-19 ENCOUNTER — Ambulatory Visit: Payer: Medicare Other

## 2013-02-19 ENCOUNTER — Ambulatory Visit (INDEPENDENT_AMBULATORY_CARE_PROVIDER_SITE_OTHER): Payer: Medicare Other | Admitting: Family Medicine

## 2013-02-19 VITALS — BP 153/71 | HR 74 | Temp 98.0°F | Resp 18 | Ht 64.5 in | Wt 213.0 lb

## 2013-02-19 DIAGNOSIS — M25549 Pain in joints of unspecified hand: Secondary | ICD-10-CM

## 2013-02-19 DIAGNOSIS — M79642 Pain in left hand: Secondary | ICD-10-CM

## 2013-02-19 DIAGNOSIS — S6390XA Sprain of unspecified part of unspecified wrist and hand, initial encounter: Secondary | ICD-10-CM | POA: Diagnosis not present

## 2013-02-19 DIAGNOSIS — S63619A Unspecified sprain of unspecified finger, initial encounter: Secondary | ICD-10-CM

## 2013-02-19 MED ORDER — TRAMADOL HCL 50 MG PO TABS: 50.0000 mg | ORAL_TABLET | Freq: Three times a day (TID) | ORAL | Status: DC | PRN

## 2013-02-19 MED ORDER — MELOXICAM 15 MG PO TABS: 15.0000 mg | ORAL_TABLET | Freq: Every day | ORAL | Status: DC

## 2013-02-19 NOTE — Patient Instructions (Addendum)
Finger Sprain A sprain is an injury where a ligament is over-stretched or torn. A ligament holds joints together. Finger sprains are a common injury among athletes. Sprains are classified into 3 categories. Grade 1 sprains cause pain, but the ligament is not lengthened. Grade 2 sprains include a lengthened ligament, due to stretching or partial tearing. With grade 2 sprains, there is still function, although function may be decreased. Grade 3 sprains are marked by a complete tear of the ligament. The joint usually suffers a loss of function. Severe sprains sometimes require surgery.  SYMPTOMS   Severe pain, at the time of injury.  Often, a feeling of popping or tearing inside one or more fingers.  Tenderness, swelling, and later bruising in the finger.  Impaired ability to use the injured finger. CAUSES  Stress, often from an unnatural degree of movement, exceeds the strength of the ligament, and the ligament is either stretched or torn. RISK INCREASES WITH:  Previous finger sprain or injury.  Contact sports and sports involving catching and throwing (i.e. baseball, basketball, football).  Poor hand strength and flexibility.  Inadequate or poorly fitting protective equipment. PREVENTION Taping, protective strapping, bracing, or splints may help prevent injury. PROGNOSIS  For first time injuries, sufficient healing time before resuming activity should prevent recurring injury or permanent impairment. Due to poor blood supply, ligaments do not heal well, and require a longer healing time than other structures, such as bone. Average healing times are as follows:  Grade 1: 2-6 weeks.  Grade 2: 8-12 weeks.  Grade 3: 12-16 weeks. RELATED COMPLICATIONS   Longer healing time, if activity is resumed too soon.  Frequently recurring symptoms and repeated injury, resulting in a chronic problem.  Injury to other structures (bone, cartilage, or tendon).  Arthritis of the affected  joint.  Prolonged impairment (sometimes).  Finger stiffness. TREATMENT Treatment first consists of ice and medicine, to reduce pain and inflammation. Compression bandages and elevation may help reduce inflammation and discomfort. After swelling goes down, the joint should be restrained for a length of time prescribed by your caregiver. After restraint, stretching and strengthening exercises are needed. Exercises may be completed at home or with a therapist. Rarely, surgical treatment is needed. Taping may be advised, when returning to sports.  MEDICATION   If pain medicine is needed, nonsteroidal anti-inflammatory medicines (aspirin and ibuprofen), or other minor pain relievers (acetaminophen), are often advised.  Do not take pain medicine for 7 days before surgery.  Stronger pain relievers may be prescribed by your caregiver. Use only as directed and only as much as you need. HEAT AND COLD  Cold treatment (icing) relieves pain and reduces inflammation. Cold treatment should be applied for 10 to 15 minutes every 2 to 3 hours, and immediately after activity that aggravates your symptoms. Use ice packs or an ice massage.  Heat treatment may be used before performing stretching and strengthening activities prescribed by your caregiver, physical therapist, or athletic trainer. Use a heat pack or a warm water soak. SEEK MEDICAL CARE IF:   Pain, swelling, or bruising gets worse, despite treatment, or you experience persistent pain lasting more than 2 to 4 weeks.  You experience pain, numbness, discoloration, or coldness in the hand or fingers. Blue, gray, or dark color appears in the fingernails.  Any of the following occur after surgery: increased pain, swelling, redness, drainage of fluids, bleeding in the affected area, or signs of infection, including fever.  New, unexplained symptoms develop. (Drugs used in treatment may  produce side effects.) Document Released: 10/13/2005 Document  Revised: 01/05/2012 Document Reviewed: 01/25/2009 [facility] Patient Information 2013 [place].

## 2013-02-19 NOTE — Progress Notes (Signed)
Subjective:    Patient ID: Patricia [name], female    DOB: 1936/09/03, 77 y.o.   MRN: [id number] Chief Complaint  Patient presents with  . Hand Pain    left hand ring finger- 1 month- not getting better. She tripped and thought she jammed the knuckle.    HPI Patricia [name] is a pleasant 77 yo female who tripped a month ago and hit the back of her left hand on the foot board of her bed.  Thought she just jammed her finger as she had done in the past but instead of getting better it is getting worse, just throbs all the time.     Hard to sleep at night due to pain.  Has tried tylenol w/ minimal relief.  When she makes a fist, sometimes finger will get stuck in a bent pattern and she has to physically straighten it.  When she straightens her finger, it feels like the 4th finger PIP joint is catching and popping.  Past Medical History  Diagnosis Date  . Allergy   . Arthritis   . Asthma   . Diabetes mellitus without complication   . Heart murmur   . Thyroid disease    Current Outpatient Prescriptions on File Prior to Visit  Medication Sig Dispense Refill  . amLODipine (NORVASC) 10 MG tablet Take 10 mg by mouth daily.      Marland Kitchen aspirin 81 MG tablet Take 81 mg by mouth daily.      . cetirizine (ZYRTEC) 10 MG tablet Take 10 mg by mouth daily.      Marland Kitchen diltiazem (TIAZAC) 360 MG 24 hr capsule Take 360 mg by mouth daily.      . enalapril (VASOTEC) 20 MG tablet Take 20 mg by mouth daily.      . fluticasone-salmeterol (ADVAIR HFA) 45-21 MCG/ACT inhaler Inhale 2 puffs into the lungs 2 (two) times daily.      . furosemide (LASIX) 40 MG tablet Take 40 mg by mouth daily.      Marland Kitchen levothyroxine (SYNTHROID, LEVOTHROID) 25 MCG tablet Take 0.5 mcg by mouth daily.      Marland Kitchen lovastatin (MEVACOR) 40 MG tablet Take 40 mg by mouth at bedtime.      Marland Kitchen oxybutynin (DITROPAN-XL) 10 MG 24 hr tablet Take 10 mg by mouth daily.      . Potassium 95 MG TABS Take by mouth.      . sitaGLIPtin (JANUVIA) 100 MG tablet Take 100 mg by  mouth daily.      . cephALEXin (KEFLEX) 500 MG capsule Take 1 capsule (500 mg total) by mouth 3 (three) times daily.  30 capsule  0   No current facility-administered medications on file prior to visit.   Allergies  Allergen Reactions  . Codeine Hives     Review of Systems  Constitutional: Positive for activity change. Negative for fever, chills and unexpected weight change.  Musculoskeletal: Positive for arthralgias. Negative for myalgias, back pain, joint swelling and gait problem.  Skin: Negative for color change, pallor, rash and wound.  Neurological: Negative for weakness and numbness.  Psychiatric/Behavioral: Positive for sleep disturbance.      BP 153/71  Pulse 74  Temp(Src) 98 F (36.7 C) (Oral)  Resp 18  Ht 5' 4.5" (1.638 m)  Wt 213 lb (96.616 kg)  BMI 36.01 kg/m2  SpO2 94% Objective:   Physical Exam  Constitutional: She is oriented to person, place, and time. She appears well-developed and well-nourished. No distress.  HENT:  Head: Normocephalic and atraumatic.  Right Ear: External ear normal.  Eyes: Conjunctivae are normal. No scleral icterus.  Pulmonary/Chest: Effort normal.  Musculoskeletal:       Left wrist: Normal.       Left hand: She exhibits normal range of motion, no tenderness, no bony tenderness, normal capillary refill, no deformity and no swelling. Normal sensation noted. Normal strength noted.  Neurological: She is alert and oriented to person, place, and time.  Skin: Skin is warm and dry. She is not diaphoretic. No erythema.  Psychiatric: She has a normal mood and affect. Her behavior is normal.     [facility] reading (PRIMARY) by  Dr. Brigitte Pulse. No acute bony abnormality seen. Assessment & Plan:  Hand joint pain, left - Plan: DG Hand Complete Left, Ambulatory referral to Hand Surgery  Sprain of 4th finger of left hand, initial encounter - No bony abnormality but showing signs of flexor tendon dysfunction and injury.  Rec stretching and strengthening  exercises of the hand. Start meloxicam qam and tramadol qhs to help control pain so she can sleep. Pt is very concerned that it is actively worsening so will refer to hand surgery for further eval.  Meds ordered this encounter  Medications  . traMADol (ULTRAM) 50 MG tablet    Sig: Take 1 tablet (50 mg total) by mouth every 8 (eight) hours as needed for pain.    Dispense:  30 tablet    Refill:  0  . meloxicam (MOBIC) 15 MG tablet    Sig: Take 1 tablet (15 mg total) by mouth daily.    Dispense:  30 tablet    Refill:  0

## 2013-02-28 ENCOUNTER — Other Ambulatory Visit: Payer: Medicare Other

## 2013-02-28 DIAGNOSIS — I10 Essential (primary) hypertension: Secondary | ICD-10-CM | POA: Diagnosis not present

## 2013-02-28 DIAGNOSIS — IMO0001 Reserved for inherently not codable concepts without codable children: Secondary | ICD-10-CM | POA: Diagnosis not present

## 2013-02-28 DIAGNOSIS — E039 Hypothyroidism, unspecified: Secondary | ICD-10-CM | POA: Diagnosis not present

## 2013-03-01 LAB — COMPREHENSIVE METABOLIC PANEL
ALT: 19 IU/L (ref 0–32)
AST: 16 IU/L (ref 0–40)
Albumin/Globulin Ratio: 1.7 (ref 1.1–2.5)
Albumin: 4.5 g/dL (ref 3.5–4.8)
Alkaline Phosphatase: 71 IU/L (ref 39–117)
BUN/Creatinine Ratio: 22 (ref 11–26)
BUN: 19 mg/dL (ref 8–27)
CO2: 25 mmol/L (ref 19–28)
Calcium: 10.1 mg/dL (ref 8.6–10.2)
Chloride: 100 mmol/L (ref 97–108)
Creatinine, Ser: 0.86 mg/dL (ref 0.57–1.00)
GFR calc Af Amer: 77 mL/min/{1.73_m2} (ref >59)
GFR calc non Af Amer: 67 mL/min/{1.73_m2} (ref >59)
Globulin, Total: 2.6 g/dL (ref 1.5–4.5)
Glucose: 165 mg/dL — ABNORMAL HIGH (ref 65–99)
Potassium: 5 mmol/L (ref 3.5–5.2)
Sodium: 139 mmol/L (ref 134–144)
Total Bilirubin: 0.3 mg/dL (ref 0.0–1.2)
Total Protein: 7.1 g/dL (ref 6.0–8.5)

## 2013-03-01 LAB — MICROALBUMIN / CREATININE URINE RATIO
Creatinine, Ur: 72.7 mg/dL (ref 15.0–278.0)
MICROALB/CREAT RATIO: 140.2 mg/g creat — ABNORMAL HIGH (ref 0.0–30.0)
Microalbumin, Urine: 101.9 ug/mL — ABNORMAL HIGH (ref 0.0–17.0)

## 2013-03-01 LAB — LIPID PANEL
Chol/HDL Ratio: 3.2 ratio units (ref 0.0–4.4)
Cholesterol, Total: 183 mg/dL (ref 100–199)
HDL: 58 mg/dL (ref >39)
LDL Calculated: 95 mg/dL (ref 0–99)
Triglycerides: 149 mg/dL (ref 0–149)
VLDL Cholesterol Cal: 30 mg/dL (ref 5–40)

## 2013-03-01 LAB — HEMOGLOBIN A1C
Est. average glucose Bld gHb Est-mCnc: 197 mg/dL
Hgb A1c MFr Bld: 8.5 % — ABNORMAL HIGH (ref 4.8–5.6)

## 2013-03-02 ENCOUNTER — Encounter: Payer: Self-pay | Admitting: *Deleted

## 2013-03-02 ENCOUNTER — Ambulatory Visit (INDEPENDENT_AMBULATORY_CARE_PROVIDER_SITE_OTHER): Payer: Medicare Other | Admitting: Internal Medicine

## 2013-03-02 ENCOUNTER — Encounter: Payer: Self-pay | Admitting: Internal Medicine

## 2013-03-02 VITALS — BP 152/64 | HR 88 | Temp 98.0°F | Resp 18 | Ht 63.0 in | Wt <= 1120 oz

## 2013-03-02 DIAGNOSIS — E669 Obesity, unspecified: Secondary | ICD-10-CM | POA: Diagnosis not present

## 2013-03-02 DIAGNOSIS — E039 Hypothyroidism, unspecified: Secondary | ICD-10-CM

## 2013-03-02 DIAGNOSIS — E1159 Type 2 diabetes mellitus with other circulatory complications: Secondary | ICD-10-CM | POA: Insufficient documentation

## 2013-03-02 DIAGNOSIS — IMO0001 Reserved for inherently not codable concepts without codable children: Secondary | ICD-10-CM

## 2013-03-02 DIAGNOSIS — I10 Essential (primary) hypertension: Secondary | ICD-10-CM

## 2013-03-02 DIAGNOSIS — E119 Type 2 diabetes mellitus without complications: Secondary | ICD-10-CM | POA: Insufficient documentation

## 2013-03-02 DIAGNOSIS — H9193 Unspecified hearing loss, bilateral: Secondary | ICD-10-CM

## 2013-03-02 DIAGNOSIS — H6120 Impacted cerumen, unspecified ear: Secondary | ICD-10-CM

## 2013-03-02 DIAGNOSIS — H6121 Impacted cerumen, right ear: Secondary | ICD-10-CM

## 2013-03-02 DIAGNOSIS — H9190 Unspecified hearing loss, unspecified ear: Secondary | ICD-10-CM

## 2013-03-02 MED ORDER — GLIPIZIDE 10 MG PO TABS: ORAL_TABLET | ORAL | Status: DC

## 2013-03-02 NOTE — Progress Notes (Signed)
Date: 03/02/2013  MRN:  [id number] Name:  Patricia [name] Sex:  female Age:  77 y.o. DOB:1936/08/18   [facility] #: [id number]                      Level Of Care: Independent Provider: A. Darlin Stenseth  Emergency Contacts: Contact Information   Name Relation Home Work [place] Daughter [phone]        Code Status: full  Allergies: Allergies  Allergen Reactions  . Codeine Hives  . Vesicare (Solifenacin)      Chief Complaint  Patient presents with  . Annual Exam    right ear pain      HPI: Hurt left hand and hit the hand on a bedpost about 6 weeks ago, then felt something hurt bad after she opened a jar a few weeks later.[facility] [address] To see Dr. Gladstone Lighter tomorrow. Still in pain.  Right ear in pain. Started months ago. Comes and goes.  Leaves spontaneously. No pain today.   Sometimes loses her balance sometimes. Does not seem related to her ear problem.  Edema is unchanged. She is on amlodipine. Uses furosemide about 32 times weekly.  Past Medical History  Diagnosis Date  . Allergy   . Arthritis   . Asthma   . Diabetes mellitus without complication   . Heart murmur   . Thyroid disease   . History of aortic valve disease   . Diarrhea   . Urge incontinence   . Lumbago   . Edema   . Cervicalgia   . Chronic airway obstruction, not elsewhere classified   . Pain in joint, lower leg   . Routine gynecological examination   . Scoliosis (and kyphoscoliosis), idiopathic   . Type II or unspecified type diabetes mellitus without mention of complication, uncontrolled   . Encounter for long-term (current) use of other medications   . Unspecified essential hypertension   . Other and unspecified hyperlipidemia   . Benign neoplasm of colon   . Unspecified urinary incontinence   . Anemia, unspecified   . Carpal tunnel syndrome   . Unspecified hemorrhoids without mention of complication   . Diffuse cystic mastopathy   . Unspecified  hypothyroidism   . Obesity, unspecified   . Cellulitis and abscess of finger, unspecified   . Other psoriasis     Past Surgical History  Procedure Laterality Date  . Breast surgery      right breast 3 surgeries [phone]  . Cholecystectomy  1992    DR BOWMAN  . Colon surgery      2004,2007,2013.  3 times colon surgieres  . Abdominal hysterectomy  1987    complete  . Vaginal cyst removed  1967  . 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 Foot surgery  1989  . Excise le mandibular lymph node t      DR C. NEWMAN  . Left shoulder arthroscopy    . Neuroplasty / transposition median nerve at carpal tunnel bilateral  05/2003     Procedures: 03/01/1996 Mammogram: Normal All Mammograms are done at [facility] Normal  05/23/1998 X-Ray  Left ankle Normal 07/11/1998 Chest X-Ray  09/09/1998 Stress Test 2000 Bone Density Normal   11/01/1998 Stress Test  06/29/1999 Bone Density : normal 03/19/2001 X-Ray Left Shoulder Normal  12/12/2002 Colonoscopy Dr. Lajoyce Corners  ulcer right colon  12/12/2002 Endoscopy Dr. Lajoyce Corners Barrett's esophagus polyps and gastric fungus  12/14/2003 CT  oh the Chest: Focal pleural parenchymal scar in the peripheral right base  03/39/2006 Chest X-Ray Normal  05/02/2005 Mammogram Normal  07/14/2006 Mammogram Normal  09/15/2008-Mammogram:Normal 01/18/2009-Left Shoulder Arthrogram: Normal 10/09/2009-Mammogram: Possible mass, left breast. Additional evaluation is indicated. The patient will be contacted for additional studies and a supplementary report will follow. No specific mammographic evidence of malignancy right breast. 10/17/2009-Left Breast Biopsy: Ultrasound guided biopsy of left breast abnormality. No apparent complications. Findings consistent with radial scar. Focal sclerosing adenosis. Fibrocystic changes. No malignancy identified. 01/04/2010-Lumbar X-Ray: Moderate lumbar degenerative changes without acute bony findings. 10/23/2010-Left Breast  Ultrasound: Postsurgical changes, Left beast. No mammographic evidence of malignancy. Recommend routine screening mammography in 1 year. 08/21/12: Colonoscopy: Left-sided diverticulosis. Sessile polyp. Internal hemorrhoids.   Consultants: Dr. Claudean Kinds Ophthalmology  Dr. Modena Morrow General Surgery  Dr. Deon Pilling General Surgery  Dr. Dennard Schaumann, Dermatology  Dr. Glynn Octave Allergy  Dr. Linard Millers Cardiology  Dr. Radene Journey ENT  Dr. Shellia Carwin Orthopedics  Dr. Janus Molder  Podiatry  Dr. Lajoyce Corners GI  Dr. Carlean Purl GI    Current Outpatient Prescriptions  Medication Sig Dispense Refill  . amLODipine (NORVASC) 10 MG tablet Take 10 mg by mouth daily.      Marland Kitchen aspirin 81 MG tablet Take 81 mg by mouth daily.      . cetirizine (ZYRTEC) 10 MG tablet Take 10 mg by mouth daily.      Marland Kitchen diltiazem (TIAZAC) 360 MG 24 hr capsule Take 360 mg by mouth daily.      . enalapril (VASOTEC) 20 MG tablet Take 20 mg by mouth daily.      . fluticasone-salmeterol (ADVAIR HFA) 45-21 MCG/ACT inhaler Inhale 2 puffs into the lungs 2 (two) times daily.      . furosemide (LASIX) 40 MG tablet Take 40 mg by mouth daily.      Marland Kitchen levothyroxine (SYNTHROID, LEVOTHROID) 25 MCG tablet Take 0.5 mcg by mouth daily.      Marland Kitchen lovastatin (MEVACOR) 40 MG tablet Take 40 mg by mouth at bedtime.      . metFORMIN (GLUCOPHAGE) 500 MG tablet Take 500 mg by mouth 2 (two) times daily with a meal. Take one tablet before breakfast and one tablet before supper to control blood sugars      . Multiple Vitamin (MULTIVITAMIN) tablet Take 1 tablet by mouth daily. Take one tablet once a day      . oxybutynin (DITROPAN-XL) 10 MG 24 hr tablet Take 10 mg by mouth daily.      . Potassium 95 MG TABS Take by mouth.      . traMADol (ULTRAM) 50 MG tablet Take 1 tablet (50 mg total) by mouth every 8 (eight) hours as needed for pain.  30 tablet  0  . meloxicam (MOBIC) 15 MG tablet Take 1 tablet (15 mg total) by mouth daily.  30 tablet  0   No current  facility-administered medications for this visit.    Immunization History  Administered Date(s) Administered  . DTaP 08/03/2003  . Pneumococcal Conjugate 12/11/1993     Diet: No concentrated sweets  History  Substance Use Topics  . Smoking status: Former Smoker    Quit date: 09/07/1989  . Smokeless tobacco: Not on file  . Alcohol Use: No    Family History  Problem Relation Age of Onset  . Diabetes Father   . Stroke Father   . Heart disease Father   . Cancer Sister   . Heart disease Brother   . Asthma Sister   .  Arthritis Sister     Review of systems Constitutional: positive for Weight gain, negative for anorexia, fatigue and fevers Eyes: positive for contacts/glasses, negative for glaucoma and irritation Ears, nose, mouth, throat, and face: positive for hearing loss, negative for ear drainage, earaches and voice change Respiratory: negative Cardiovascular: negative Gastrointestinal: negative Genitourinary:negative Integument/breast: negative Hematologic/lymphatic: negative Musculoskeletal:positive for arthralgias and myalgias, negative for bone pain and neck pain Neurological: negative Behavioral/Psych: negative Endocrine: negative, History of diabetes Allergic/Immunologic: negative  Vital signs: BP 152/64  Pulse 88  Temp(Src) 98 F (36.7 C)  Resp 18  Ht _0  (1.6 m)  Wt 21 lb (9.526 kg)  BMI 3.72 kg/m2   General Appearance:    Alert, cooperative, no distress, appears stated age. Obese,  Head:    Normocephalic, without obvious abnormality, atraumatic  Eyes:    PERRL, conjunctiva/corneas clear, EOM's intact, fundi    benign, both eyes  Ears:    right external auditory canal occluded by wax. Manipulation was painful. Wax is really quite firm and seems attached to the wall of the ear canal.   Nose:   Nares normal, septum midline, mucosa normal, no drainage    or sinus tenderness  Throat:   Lips, mucosa, and tongue normal; teeth and gums normal  Neck:    Supple, symmetrical, trachea midline, no adenopathy;    thyroid:  no enlargement/tenderness/nodules; no carotid   bruit or JVD  Back:     Symmetric, no curvature, ROM normal, no CVA tenderness  Lungs:     Clear to auscultation bilaterally, respirations unlabored  Chest Wall:    No tenderness or deformity   Heart:    Regular rate and rhythm, S1 and S2 normal, no murmur, rub   or gallop  Breast Exam:    No tenderness, masses, or nipple abnormality  Abdomen:     Soft, non-tender, bowel sounds active all four quadrants,    no masses, no organomegaly  Genitalia:    Normal female without lesion, discharge or tenderness  Rectal:    Normal tone, normal prostate, no masses or tenderness;   guaiac negative stool  Extremities:   Extremities normal, atraumatic, no cyanosis or edema  Pulses:   2+ and symmetric all extremities  Skin:   Skin color, texture, turgor normal, no rashes or lesions  Lymph nodes:   Cervical, supraclavicular, and axillary nodes normal  Neurologic:   CNII-XII intact, normal strength, sensation and reflexes    throughout     Screening Score  MMS    PHQ2 0  PHQ9     Fall Risk    BIMS    Laboratory reports  04/15/2010  BMP: glucose 127, BUN 20, Creatinine 0.93 A1C: 7.3 Lipid: Cholesterol 225, Triglycerides 145, HDL 58, LDL 138 TSH: 3.640 12/16/2010  BMP: glucose 140, BUN 16, Creatinine 0.67 A1C: 7.0 Lipid: Cholesterol 195, Triglycerides 197, HDL 61, LDL 95 TSH: 3.610 06/09/2011 BMP: GLucose 137, BUN 16, Creatinine 0.71 HA1C: 7.1 Lipid: Cholesterol 199, Triglycerides 63, HDL 35, LDL 101 TSH: 4.870 06/11/11 EKG: rate 72. NSR. ST and T abn. Consider anterolateral ischemia or LV strain 02/16/2012  BMP: glucose 141, BUN 16, Creatinine 0.72 A1C: 7.3  02/28/13 CMP: glu 165, otherwise normal  A1c 8.5  Lipids: Tc 183,, trig 149, HDL 58, LDL 95  Urine MA: 101.9 03/02/13 EKG: rate 6. NSR. LVH with repolarization abnormality.   Annual summary: Hospitalizations:  None Infection History: Bronchitis that cleared in January 2014 Functional assessment: Independent in all ADLs Areas of  potential improvement: Obesity Rehabilitation Potential: None likely Prognosis for survival: Good   Plan: Unspecified essential hypertension -  Under good control. Continue medications as currently prescribed. Plan: EKG 12-Lead  Type II or unspecified type diabetes mellitus without mention of complication, uncontrolled - Patient needs to lose weight in order to gain better control of her diabetes.  Plan: Hemoglobin P6U, Basic metabolic panel, glipiZIDE (GLUCOTROL) 10 MG tablet  Unspecified hypothyroidism Under good control  Obesity, unspecified Counseled about weight loss and diet.  Cerumen impaction, right Attempted removal with forceps in the office, but this was too uncomfortable. I recommended softening the wax with olive oil 2-3 drops nightly for the next week and then return for wax removal.  Hearing decreased, bilateral Probably related to degeneration of eighth cranial nerves, but wax in the right ear may contribute to hearing loss.

## 2013-03-02 NOTE — Patient Instructions (Signed)
Staart glipizide to control diabetes. Continue Metformin. Concentrate on weight loss.

## 2013-03-03 ENCOUNTER — Encounter: Payer: Self-pay | Admitting: Internal Medicine

## 2013-03-03 DIAGNOSIS — H6120 Impacted cerumen, unspecified ear: Secondary | ICD-10-CM | POA: Insufficient documentation

## 2013-03-03 DIAGNOSIS — M6530 Trigger finger, unspecified finger: Secondary | ICD-10-CM | POA: Diagnosis not present

## 2013-03-03 DIAGNOSIS — H9190 Unspecified hearing loss, unspecified ear: Secondary | ICD-10-CM | POA: Insufficient documentation

## 2013-03-03 DIAGNOSIS — E039 Hypothyroidism, unspecified: Secondary | ICD-10-CM | POA: Insufficient documentation

## 2013-03-10 ENCOUNTER — Ambulatory Visit (INDEPENDENT_AMBULATORY_CARE_PROVIDER_SITE_OTHER): Payer: Medicare Other

## 2013-03-10 DIAGNOSIS — H938X9 Other specified disorders of ear, unspecified ear: Secondary | ICD-10-CM

## 2013-03-10 DIAGNOSIS — H6120 Impacted cerumen, unspecified ear: Secondary | ICD-10-CM

## 2013-03-10 NOTE — Progress Notes (Signed)
Patient ID: Patricia [name], female   DOB: May 30, 1936, 77 y.o.   MRN: [id number] Removed Wax from Left ear. Right ear Clear. Patient feeling good and very thankful. No redness or bleeding within the ear, all clear. Raphael Gibney, CMA

## 2013-05-23 ENCOUNTER — Other Ambulatory Visit: Payer: Self-pay | Admitting: Internal Medicine

## 2013-05-23 ENCOUNTER — Encounter: Payer: Self-pay | Admitting: Internal Medicine

## 2013-05-23 ENCOUNTER — Ambulatory Visit (INDEPENDENT_AMBULATORY_CARE_PROVIDER_SITE_OTHER): Payer: Medicare Other | Admitting: Internal Medicine

## 2013-05-23 ENCOUNTER — Ambulatory Visit
Admission: RE | Admit: 2013-05-23 | Discharge: 2013-05-23 | Disposition: A | Discharge status: Home / Self Care | Payer: Medicare Other | Source: Ambulatory Visit | Attending: Internal Medicine | Admitting: Internal Medicine

## 2013-05-23 VITALS — BP 144/86 | HR 78 | Temp 97.7°F | Resp 14 | Ht 63.0 in | Wt 215.8 lb

## 2013-05-23 DIAGNOSIS — M5137 Other intervertebral disc degeneration, lumbosacral region: Secondary | ICD-10-CM | POA: Diagnosis not present

## 2013-05-23 DIAGNOSIS — IMO0001 Reserved for inherently not codable concepts without codable children: Secondary | ICD-10-CM | POA: Diagnosis not present

## 2013-05-23 DIAGNOSIS — M5136 Other intervertebral disc degeneration, lumbar region: Secondary | ICD-10-CM

## 2013-05-23 DIAGNOSIS — M76899 Other specified enthesopathies of unspecified lower limb, excluding foot: Secondary | ICD-10-CM

## 2013-05-23 DIAGNOSIS — M7062 Trochanteric bursitis, left hip: Secondary | ICD-10-CM

## 2013-05-23 DIAGNOSIS — M169 Osteoarthritis of hip, unspecified: Secondary | ICD-10-CM | POA: Diagnosis not present

## 2013-05-23 DIAGNOSIS — M25559 Pain in unspecified hip: Secondary | ICD-10-CM | POA: Diagnosis not present

## 2013-05-23 MED ORDER — PREDNISONE 10 MG PO TABS: ORAL_TABLET | ORAL | Status: DC

## 2013-05-23 NOTE — Progress Notes (Signed)
Patient ID: Patricia [name], female   DOB: [birth year], 77 y.o.   MRN: [id number] Location:  [facility]   Allergies  Allergen Reactions  . Codeine Hives  . Vesicare (Solifenacin)     Chief Complaint  Patient presents with  . Leg Pain    Left Leg Pain    HPI: Patient is a 77 y.o. seen in the office today for acute visit with left leg pain.  Is intermittent--will go away for just a short time and come back.  Starts in hip or in groin and radiates all the way down leg to foot.  Sometimes hurts, sometimes burns.  Has thought about what she would have done Thursday to start it, but says she only went shopping.  Does have some chronic pain in both legs with cramps at night, and has had this same type of pain just for a short time and would resolve, but this one stays.  Meloxicam and tramadol are not helping.  Sometimes lying down it hurts and has to find right position.  Can find one, but takes a while.  Does have bad back, but it does not hurt right now.  No falls.    Review of Systems:  Review of Systems  Constitutional: Negative for fever and chills.  Musculoskeletal: Positive for back pain and joint pain. Negative for falls.       Left hip and shooting down left leg  Neurological: Positive for sensory change. Negative for dizziness.  Endo/Heme/Allergies:       Hyperglycemia     Past Medical History  Diagnosis Date  . Allergy   . Arthritis   . Asthma   . Diabetes mellitus without complication   . Heart murmur   . Thyroid disease   . History of aortic valve disease   . Diarrhea   . Urge incontinence   . Lumbago   . Edema   . Cervicalgia   . Chronic airway obstruction, not elsewhere classified   . Pain in joint, lower leg   . Routine gynecological examination   . Scoliosis (and kyphoscoliosis), idiopathic   . Type II or unspecified type diabetes mellitus without mention of complication, uncontrolled   . Encounter for long-term  (current) use of other medications   . Unspecified essential hypertension   . Other and unspecified hyperlipidemia   . Benign neoplasm of colon   . Unspecified urinary incontinence   . Anemia, unspecified   . Carpal tunnel syndrome   . Unspecified hemorrhoids without mention of complication   . Diffuse cystic mastopathy   . Unspecified hypothyroidism   . Obesity, unspecified   . Cellulitis and abscess of finger, unspecified   . Other psoriasis   . Type II or unspecified type diabetes mellitus without mention of complication, uncontrolled   . Cerumen impaction     Past Surgical History  Procedure Laterality Date  . Breast surgery      right breast 3 surgeries [phone]  . Cholecystectomy  1992    DR BOWMAN  . Colon surgery      2004,2007,2013.  3 times colon surgieres  . Abdominal hysterectomy  1987    complete  . Vaginal cyst removed  1967  . 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 Foot surgery  1989  . Excise le mandibular lymph node t      DR C. NEWMAN  . Left shoulder arthroscopy    . Neuroplasty / transposition median nerve at carpal  tunnel bilateral  05/2003    Social History:   reports that she quit smoking about 23 years ago. She does not have any smokeless tobacco history on file. She reports that she does not drink alcohol or use illicit drugs.  Family History  Problem Relation Age of Onset  . Diabetes Father   . Stroke Father   . Heart disease Father   . Cancer Sister   . Heart disease Brother   . Asthma Sister   . Arthritis Sister     Medications: Patient's Medications  New Prescriptions   PREDNISONE (DELTASONE) 10 MG TABLET    6 tabs today 7/28, 5 tabs 7/29, 4 tabs 7/30, 3 tabs 7/31, 2 tabs 8/1, 1 tab 05/28/13  Previous Medications   AMLODIPINE (NORVASC) 10 MG TABLET    Take 10 mg by mouth daily.   ASPIRIN 81 MG TABLET    Take 81 mg by mouth daily.   CETIRIZINE (ZYRTEC) 10 MG TABLET    Take 10 mg by mouth daily.   DILTIAZEM (TIAZAC) 360 MG 24 HR  CAPSULE    Take 360 mg by mouth daily.   ENALAPRIL (VASOTEC) 20 MG TABLET    Take 20 mg by mouth daily.   FLUTICASONE-SALMETEROL (ADVAIR HFA) 45-21 MCG/ACT INHALER    Inhale 2 puffs into the lungs 2 (two) times daily.   FUROSEMIDE (LASIX) 40 MG TABLET    Take 40 mg by mouth daily.   GLIPIZIDE (GLUCOTROL) 10 MG TABLET    One before breakfast and one before supper to control diabetes   LEVOTHYROXINE (SYNTHROID, LEVOTHROID) 25 MCG TABLET    Take 0.5 mcg by mouth daily.   LOVASTATIN (MEVACOR) 40 MG TABLET    Take 40 mg by mouth at bedtime.   MELOXICAM (MOBIC) 15 MG TABLET    Take 1 tablet (15 mg total) by mouth daily.   METFORMIN (GLUCOPHAGE) 500 MG TABLET    Take 500 mg by mouth 2 (two) times daily with a meal. Take one tablet before breakfast and one tablet before supper to control blood sugars   MULTIPLE VITAMIN (MULTIVITAMIN) TABLET    Take 1 tablet by mouth daily. Take one tablet once a day   OXYBUTYNIN (DITROPAN-XL) 10 MG 24 HR TABLET    Take 10 mg by mouth daily.   POTASSIUM 95 MG TABS    Take by mouth.   TRAMADOL (ULTRAM) 50 MG TABLET    Take 1 tablet (50 mg total) by mouth every 8 (eight) hours as needed for pain.  Modified Medications   No medications on file  Discontinued Medications   No medications on file     Physical Exam: Filed Vitals:   05/23/13 1356  BP: 144/86  Pulse: 78  Temp: 97.7 F (36.5 C)  TempSrc: Oral  Resp: 14  Height: _0  (1.6 m)  Weight: 215 lb 12.8 oz (97.886 kg)  Physical Exam  Constitutional: She is oriented to person, place, and time.  Cardiovascular: Normal rate, regular rhythm and normal heart sounds.   Pulmonary/Chest: Effort normal and breath sounds normal. No respiratory distress.  Musculoskeletal: She exhibits tenderness.  Over trochanteric bursa and in left groin  Neurological: She is alert and oriented to person, place, and time.  Skin: Skin is warm and dry.  Psychiatric: She has a normal mood and affect.   Labs reviewed: Basic  Metabolic Panel:  Recent Labs  02/28/13 0850  NA 139  K 5.0  CL 100  CO2 25  GLUCOSE  165*  BUN 19  CREATININE 0.86  CALCIUM 10.1   Liver Function Tests:  Recent Labs  02/28/13 0850  AST 16  ALT 19  ALKPHOS 71  BILITOT 0.3  PROT 7.1  Lipid Panel:  Recent Labs  02/28/13 0850  HDL 58  LDLCALC 95  TRIG 149  CHOLHDL 3.2   Lab Results  Component Value Date   HGBA1C 8.5* 02/28/2013   Assessment/Plan 1. Trochanteric bursitis of left hip - DG Hip Complete Left; Future and pelvis xrays 1-2 views - predniSONE (DELTASONE) 10 MG tablet; 6 tabs today 7/28, 5 tabs 7/29, 4 tabs 7/30, 3 tabs 7/31, 2 tabs 8/1, 1 tab 05/28/13  Dispense: 21 tablet; Refill: 0 -pain not improved with muscle relaxant or tramadol 2. Degenerative disc disease, lumbar - DG Hip Complete Left; Future and pelvis xrays 1-2 views;  Has preivously had lumbar spine films with degenerative disc disease - predniSONE (DELTASONE) 10 MG tablet; 6 tabs today 7/28, 5 tabs 7/29, 4 tabs 7/30, 3 tabs 7/31, 2 tabs 8/1, 1 tab 05/28/13  Dispense: 21 tablet; Refill: 0  3. Type II or unspecified type diabetes mellitus without mention of complication, uncontrolled -warned pt that glucose will be elevated with prednisone taper--already poorly controlled at this point--not helping her neuropathic pain  Labs/tests ordered:  xrays left hip and pelvis Next appt: as scheduled with Dr. Nyoka Cowden

## 2013-06-06 ENCOUNTER — Other Ambulatory Visit: Payer: Medicare Other

## 2013-06-06 DIAGNOSIS — IMO0001 Reserved for inherently not codable concepts without codable children: Secondary | ICD-10-CM | POA: Diagnosis not present

## 2013-06-07 LAB — BASIC METABOLIC PANEL
BUN/Creatinine Ratio: 20 (ref 11–26)
BUN: 18 mg/dL (ref 8–27)
CO2: 23 mmol/L (ref 18–29)
Calcium: 9.5 mg/dL (ref 8.6–10.2)
Chloride: 100 mmol/L (ref 97–108)
Creatinine, Ser: 0.92 mg/dL (ref 0.57–1.00)
GFR calc Af Amer: 71 mL/min/{1.73_m2} (ref >59)
GFR calc non Af Amer: 62 mL/min/{1.73_m2} (ref >59)
Glucose: 154 mg/dL — ABNORMAL HIGH (ref 65–99)
Potassium: 5.2 mmol/L (ref 3.5–5.2)
Sodium: 142 mmol/L (ref 134–144)

## 2013-06-07 LAB — HEMOGLOBIN A1C
Est. average glucose Bld gHb Est-mCnc: 177 mg/dL
Hgb A1c MFr Bld: 7.8 % — ABNORMAL HIGH (ref 4.8–5.6)

## 2013-06-08 ENCOUNTER — Ambulatory Visit (INDEPENDENT_AMBULATORY_CARE_PROVIDER_SITE_OTHER): Payer: Medicare Other | Admitting: Internal Medicine

## 2013-06-08 VITALS — BP 132/72 | HR 91 | Temp 98.1°F | Resp 16 | Ht 63.0 in | Wt 208.2 lb

## 2013-06-08 DIAGNOSIS — E039 Hypothyroidism, unspecified: Secondary | ICD-10-CM

## 2013-06-08 DIAGNOSIS — IMO0001 Reserved for inherently not codable concepts without codable children: Secondary | ICD-10-CM

## 2013-06-08 DIAGNOSIS — M545 Low back pain, unspecified: Secondary | ICD-10-CM

## 2013-06-08 DIAGNOSIS — E669 Obesity, unspecified: Secondary | ICD-10-CM

## 2013-06-08 DIAGNOSIS — I10 Essential (primary) hypertension: Secondary | ICD-10-CM

## 2013-06-08 DIAGNOSIS — E785 Hyperlipidemia, unspecified: Secondary | ICD-10-CM

## 2013-06-08 MED ORDER — MELOXICAM 15 MG PO TABS: ORAL_TABLET | ORAL | Status: DC

## 2013-06-08 MED ORDER — GLYBURIDE 5 MG PO TABS: ORAL_TABLET | ORAL | Status: DC

## 2013-06-08 NOTE — Patient Instructions (Signed)
Due to itching on glipizide, switch to glyburide. Use meloxicam as needed for back or other arthritis pains.

## 2013-06-08 NOTE — Progress Notes (Signed)
Subjective:    Patient ID: Patricia [name], female    DOB: [birth year], 77 y.o.   MRN: [id number]  HPI   Unspecified essential hypertension: controlled  Type II or unspecified type diabetes mellitus without mention of complication, uncontrolled : controlled. Patient says that when she uses glipizide, he frequently begins itching.  Unspecified hypothyroidism: needs recheck   Obesity, unspecified: gained 3# since last visit  Other and unspecified hyperlipidemia: controlled  Lumbago; improved. Back pains are better. She is not using Mobic. Would like exercise directions.   Current Outpatient Prescriptions on File Prior to Visit  Medication Sig Dispense Refill  . amLODipine (NORVASC) 10 MG tablet Take 10 mg by mouth daily.      Marland Kitchen aspirin 81 MG tablet Take 81 mg by mouth daily.      . cetirizine (ZYRTEC) 10 MG tablet Take 10 mg by mouth daily.      Marland Kitchen diltiazem (TIAZAC) 360 MG 24 hr capsule Take 360 mg by mouth daily.      . enalapril (VASOTEC) 20 MG tablet Take 20 mg by mouth daily.      . fluticasone-salmeterol (ADVAIR HFA) 45-21 MCG/ACT inhaler Inhale 2 puffs into the lungs 2 (two) times daily.      . furosemide (LASIX) 40 MG tablet Take 40 mg by mouth daily.      Marland Kitchen glipiZIDE (GLUCOTROL) 10 MG tablet One before breakfast and one before supper to control diabetes  180 tablet  3  . levothyroxine (SYNTHROID, LEVOTHROID) 25 MCG tablet Take 0.5 mcg by mouth daily.      Marland Kitchen lovastatin (MEVACOR) 40 MG tablet Take 40 mg by mouth at bedtime.      . meloxicam (MOBIC) 15 MG tablet Take 1 tablet (15 mg total) by mouth daily.  30 tablet  0  . metFORMIN (GLUCOPHAGE) 500 MG tablet Take 500 mg by mouth 2 (two) times daily with a meal. Take one tablet before breakfast and one tablet before supper to control blood sugars      . oxybutynin (DITROPAN-XL) 10 MG 24 hr tablet Take 10 mg by mouth daily.      . Potassium 95 MG TABS Take by mouth.       No current facility-administered medications on file prior  to visit.    Review of Systems  Constitutional:       Gaining weight  HENT: Negative.   Eyes: Negative.   Respiratory: Negative.   Cardiovascular: Negative.   Gastrointestinal: Negative.   Endocrine:       Diabetic  Genitourinary: Negative.   Musculoskeletal: Positive for back pain.  Skin: Negative.   Allergic/Immunologic: Negative.   Neurological: Negative.   Hematological: Negative.   Psychiatric/Behavioral: Negative.        Objective:BP 132/72  Pulse 91  Temp(Src) 98.1 F (36.7 C) (Oral)  Resp 16  Ht _0  (1.6 m)  Wt 208 lb 3.2 oz (94.439 kg)  BMI 36.89 kg/m2  SpO2 95%    Physical Exam  Constitutional: She is oriented to person, place, and time. No distress.  Obese  HENT:  Head: Normocephalic and atraumatic.  Right Ear: External ear normal.  Left Ear: External ear normal.  Nose: Nose normal.  Mouth/Throat: Oropharynx is clear and moist.  Eyes: Conjunctivae and EOM are normal.  Neck: Normal range of motion. Neck supple. No JVD present. No tracheal deviation present. No thyromegaly present.  Cardiovascular: Normal rate, regular rhythm, normal heart sounds and intact distal pulses.  Exam reveals no gallop  and no friction rub.   No murmur heard. Pulmonary/Chest: No respiratory distress. She has no wheezes. She exhibits no tenderness.  Abdominal: Bowel sounds are normal. She exhibits no distension and no mass. There is no tenderness.  Musculoskeletal: Normal range of motion. She exhibits edema. She exhibits no tenderness.  Lymphadenopathy:    She has no cervical adenopathy.  Neurological: She is alert and oriented to person, place, and time. She has normal reflexes. No cranial nerve deficit. Coordination normal.  Skin: No rash noted. No erythema. No pallor.  Psychiatric: She has a normal mood and affect. Judgment and thought content normal.     Appointment on 06/06/2013  Component Date Value Range Status  . Hemoglobin A1C 06/06/2013 7.8* 4.8 - 5.6 % Final    Comment:          Increased risk for diabetes: 5.7 - 6.4                                   Diabetes: >6.4                                   Glycemic control for adults with diabetes: <7.0  . Estimated average glucose 06/06/2013 177   Final  . Glucose 06/06/2013 154* 65 - 99 mg/dL Final  . BUN 06/06/2013 18  8 - 27 mg/dL Final  . Creatinine, Ser 06/06/2013 0.92  0.57 - 1.00 mg/dL Final  . GFR calc non Af Amer 06/06/2013 62  >59 mL/min/1.73 Final  . GFR calc Af Amer 06/06/2013 71  >59 mL/min/1.73 Final  . BUN/Creatinine Ratio 06/06/2013 20  11 - 26 Final  . Sodium 06/06/2013 142  134 - 144 mmol/L Final  . Potassium 06/06/2013 5.2  3.5 - 5.2 mmol/L Final  . Chloride 06/06/2013 100  97 - 108 mmol/L Final  . CO2 06/06/2013 23  18 - 29 mmol/L Final  . Calcium 06/06/2013 9.5  8.6 - 10.2 mg/dL Final         Assessment & Plan:  Unspecified essential hypertension: Controlled  Type II or unspecified type diabetes mellitus without mention of complication, uncontrolled: Improving. Further improvement will likely benefit from weight loss and dietary adherence. I have chosen not to increase medications this visit. She was changed from glipizide to the glyburide due to her belief that she had itching while taking glipizide.   - Plan: Basic metabolic panel, Microalbumin/Creatinine Ratio, Urine, glyBURIDE (DIABETA) 5 MG tablet  Unspecified hypothyroidism: Controlled   - Plan: TSH  Obesity, unspecified: Dietary compliance encouraged  Other and unspecified hyperlipidemia: Controlled   - Plan: Lipid panel  Lumbago: She would like to try an arthritis medicine on "bad" days when the pain is increased   - Plan: meloxicam (MOBIC) 15 MG tablet

## 2013-07-25 DIAGNOSIS — L570 Actinic keratosis: Secondary | ICD-10-CM | POA: Diagnosis not present

## 2013-07-25 DIAGNOSIS — T148 Other injury of unspecified body region: Secondary | ICD-10-CM | POA: Diagnosis not present

## 2013-07-25 DIAGNOSIS — Q828 Other specified congenital malformations of skin: Secondary | ICD-10-CM | POA: Diagnosis not present

## 2013-07-25 DIAGNOSIS — Z85828 Personal history of other malignant neoplasm of skin: Secondary | ICD-10-CM | POA: Diagnosis not present

## 2013-08-02 ENCOUNTER — Ambulatory Visit (INDEPENDENT_AMBULATORY_CARE_PROVIDER_SITE_OTHER): Payer: Medicare Other | Admitting: Family Medicine

## 2013-08-02 VITALS — BP 152/70 | HR 82 | Temp 97.8°F | Resp 17 | Ht 63.5 in | Wt 214.0 lb

## 2013-08-02 DIAGNOSIS — L02419 Cutaneous abscess of limb, unspecified: Secondary | ICD-10-CM

## 2013-08-02 DIAGNOSIS — M549 Dorsalgia, unspecified: Secondary | ICD-10-CM

## 2013-08-02 DIAGNOSIS — E119 Type 2 diabetes mellitus without complications: Secondary | ICD-10-CM | POA: Diagnosis not present

## 2013-08-02 DIAGNOSIS — L03115 Cellulitis of right lower limb: Secondary | ICD-10-CM

## 2013-08-02 LAB — BASIC METABOLIC PANEL
BUN: 17 mg/dL (ref 6–23)
CO2: 26 mEq/L (ref 19–32)
Calcium: 10 mg/dL (ref 8.4–10.5)
Chloride: 101 mEq/L (ref 96–112)
Creat: 0.88 mg/dL (ref 0.50–1.10)
Glucose, Bld: 133 mg/dL — ABNORMAL HIGH (ref 70–99)
Potassium: 4.7 mEq/L (ref 3.5–5.3)
Sodium: 136 mEq/L (ref 135–145)

## 2013-08-02 LAB — POCT GLYCOSYLATED HEMOGLOBIN (HGB A1C): Hemoglobin A1C: 7

## 2013-08-02 LAB — GLUCOSE, POCT (MANUAL RESULT ENTRY): POC Glucose: 161 mg/dl — AB (ref 70–99)

## 2013-08-02 MED ORDER — PREDNISONE 20 MG PO TABS: ORAL_TABLET | ORAL | Status: DC

## 2013-08-02 MED ORDER — METHOCARBAMOL 500 MG PO TABS: 500.0000 mg | ORAL_TABLET | Freq: Three times a day (TID) | ORAL | Status: DC

## 2013-08-02 MED ORDER — CEPHALEXIN 500 MG PO CAPS: 500.0000 mg | ORAL_CAPSULE | Freq: Two times a day (BID) | ORAL | Status: DC

## 2013-08-02 NOTE — Patient Instructions (Addendum)
Take the prednisone for nerve pain.  Remember it will make your blood sugar go up.  If you have any concern about your blood sugar please give me a call Do not take the meloxicam (for arthritis) while you are on the prednisone.    The robaxin is a muscle relaxer that you can use for pain.  It can make you sleepy so be cautious during use.  If it seems too strong you can try tylenol instead.    The keflex is for the infection in your ankle

## 2013-08-02 NOTE — Progress Notes (Addendum)
[facility] [address] [place]  [address] [phone]  Date:  08/02/2013   Name:  Patricia [name]   DOB:  1936-03-17   MRN:  [id number]  PCP:  Estill Dooms, MD    Chief Complaint: Back Pain and left leg pain   History of Present Illness:  Patricia [name] is a 77 y.o. very pleasant female patient who presents with the following:  She is here today with back and leg trouble.  She has had back pain and pain down her left leg for about one week.  She had a brief episode of this in August but got better after a couple of days This does seem similar to the pain she had then.    She has not tried any medications so far.  She has seen a chiropractor twice but this did not help much.  The left leg feels weak, and hurts with standing up.  She has noted numbness into her left foot.   No groin or genital numbness, no bowel or bladder incontinence.    She did have a couple of areas frozen by her derm a week ago, and is concerned that an area on her right ankle is infected.  She also had an area frozen on her left forearm.   Patient Active Problem List   Diagnosis Date Noted  . Unspecified hypothyroidism   . Obesity, unspecified   . Unspecified essential hypertension 03/02/2013  . Type II or unspecified type diabetes mellitus without mention of complication, uncontrolled 03/02/2013    Past Medical History  Diagnosis Date  . Allergy   . Arthritis   . Asthma   . Diabetes mellitus without complication   . Heart murmur   . Thyroid disease   . History of aortic valve disease   . Diarrhea   . Urge incontinence   . Lumbago   . Edema   . Cervicalgia   . Chronic airway obstruction, not elsewhere classified   . Pain in joint, lower leg   . Routine gynecological examination   . Scoliosis (and kyphoscoliosis), idiopathic   . Type II or unspecified type diabetes mellitus without mention of complication, uncontrolled   . Encounter for long-term (current) use of  other medications   . Unspecified essential hypertension   . Other and unspecified hyperlipidemia   . Benign neoplasm of colon   . Unspecified urinary incontinence   . Anemia, unspecified   . Carpal tunnel syndrome   . Unspecified hemorrhoids without mention of complication   . Diffuse cystic mastopathy   . Unspecified hypothyroidism   . Obesity, unspecified   . Cellulitis and abscess of finger, unspecified   . Other psoriasis   . Type II or unspecified type diabetes mellitus without mention of complication, uncontrolled   . Cerumen impaction     Past Surgical History  Procedure Laterality Date  . Breast surgery      right breast 3 surgeries [phone]  . Cholecystectomy  1992    DR BOWMAN  . Colon surgery      2004,2007,2013.  3 times colon surgieres  . Abdominal hysterectomy  1987    complete  . Vaginal cyst removed  1967  . 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 Foot surgery  1989  . Excise le mandibular lymph node t      DR C. NEWMAN  . Left shoulder arthroscopy    . Neuroplasty / transposition median nerve at carpal tunnel bilateral  05/2003    History  Substance Use Topics  . Smoking status: Former Smoker    Quit date: 09/07/1989  . Smokeless tobacco: Not on file  . Alcohol Use: No    Family History  Problem Relation Age of Onset  . Diabetes Father   . Stroke Father   . Heart disease Father   . Cancer Sister   . Heart disease Brother   . Asthma Sister   . Arthritis Sister     Allergies  Allergen Reactions  . Codeine Hives  . Vesicare [Solifenacin]     Medication list has been reviewed and updated.  Current Outpatient Prescriptions on File Prior to Visit  Medication Sig Dispense Refill  . amLODipine (NORVASC) 10 MG tablet Take 10 mg by mouth daily.      Marland Kitchen aspirin 81 MG tablet Take 81 mg by mouth daily.      . cetirizine (ZYRTEC) 10 MG tablet Take 10 mg by mouth daily.      Marland Kitchen DILTIAZEM HCL CD 360 MG 24 hr capsule       . enalapril (VASOTEC) 20 MG  tablet Take 20 mg by mouth daily.      . fluticasone-salmeterol (ADVAIR HFA) 45-21 MCG/ACT inhaler Inhale 2 puffs into the lungs 2 (two) times daily.      . furosemide (LASIX) 40 MG tablet Take 40 mg by mouth daily.      Marland Kitchen glyBURIDE (DIABETA) 5 MG tablet One before breakfast and one before supper to control diabetes  60 tablet  5  . levothyroxine (SYNTHROID, LEVOTHROID) 25 MCG tablet Take 0.5 mcg by mouth daily.      Marland Kitchen lovastatin (MEVACOR) 40 MG tablet Take 40 mg by mouth at bedtime.      . meloxicam (MOBIC) 15 MG tablet One daily to help arthritis pains  30 tablet  5  . metFORMIN (GLUCOPHAGE) 500 MG tablet Take 500 mg by mouth 2 (two) times daily with a meal. Take one tablet before breakfast and one tablet before supper to control blood sugars      . oxybutynin (DITROPAN-XL) 10 MG 24 hr tablet Take 10 mg by mouth daily.      . Potassium 95 MG TABS Take by mouth.       No current facility-administered medications on file prior to visit.    Review of Systems:  As per HPI- otherwise negative.   Physical Examination: Filed Vitals:   08/02/13 1029  BP: 152/70  Pulse: 82  Temp: 97.8 F (36.6 C)  Resp: 17   Filed Vitals:   08/02/13 1029  Height: 5' 3.5" (1.613 m)  Weight: 214 lb (97.07 kg)   Body mass index is 37.31 kg/(m^2). Ideal Body Weight: Weight in (lb) to have BMI = 25: 143.1  GEN: WDWN, NAD, Non-toxic, A & O x 3, obeses HEENT: Atraumatic, Normocephalic. Neck supple. No masses, No LAD. Ears and Nose: No external deformity. CV: RRR, No M/G/R. No JVD. No thrill. No extra heart sounds. PULM: CTA B, no wheezes, crackles, rhonchi. No retractions. No resp. distress. No accessory muscle use. ABD: S, NT, ND, +BS. No rebound. No HSM. EXTR: No c/c/e NEURO Normal gait.  PSYCH: Normally interactive. Conversant. Not depressed or anxious appearing.  Calm demeanor.  Back: flexion limited by pain.  She has a positive SLR on the left.  Normal LE strength, sensation and DTR bilaterally.    Performed foot exam; normal filament testing, skin and neurovascular integrity.   Right ankle: she has  an area s/p cryotherapy.  It is sore and slightly red around the edges.    Results for orders placed in visit on 08/02/13  POCT GLYCOSYLATED HEMOGLOBIN (HGB A1C)      Result Value Range   Hemoglobin A1C 7    GLUCOSE, POCT (MANUAL RESULT ENTRY)      Result Value Range   POC Glucose 161 (*) 70 - 99 mg/dl    Assessment and Plan: Back pain - Plan: predniSONE (DELTASONE) 20 MG tablet, methocarbamol (ROBAXIN) 500 MG tablet  Type II or unspecified type diabetes mellitus without mention of complication, not stated as uncontrolled - Plan: POCT glycosylated hemoglobin (Hb A1C), POCT glucose (manual entry), Basic metabolic panel, HM DIABETES FOOT EXAM  Cellulitis of leg, right - Plan: cephALEXin (KEFLEX) 500 MG capsule  Back pain with parasthesias.  Discussed use of prednisone in detail.  It will raise her glucose and she is aware of this.  She will keep a close eye on her glucose and let me know if any concerns.   Robaxin as needed, but cautioned regarding sedation with this medication.   Keflex for mild cellulitis of right ankle  Await BMP  Signed Lamar Blinks, MD  08/03/13; called to check on her and go over her labs.  She is feeling better- glucose has been less than 200 today.  Let her know that her BMP is ok

## 2013-08-09 ENCOUNTER — Other Ambulatory Visit: Payer: Self-pay | Admitting: *Deleted

## 2013-08-09 MED ORDER — LEVOTHYROXINE SODIUM 25 MCG PO TABS: 0.5000 ug | ORAL_TABLET | Freq: Every day | ORAL | Status: DC

## 2013-09-01 ENCOUNTER — Other Ambulatory Visit: Payer: Self-pay

## 2013-09-29 ENCOUNTER — Ambulatory Visit (INDEPENDENT_AMBULATORY_CARE_PROVIDER_SITE_OTHER): Payer: Medicare Other

## 2013-09-29 DIAGNOSIS — Z23 Encounter for immunization: Secondary | ICD-10-CM | POA: Diagnosis not present

## 2013-10-10 ENCOUNTER — Other Ambulatory Visit: Payer: Medicare Other

## 2013-10-12 ENCOUNTER — Ambulatory Visit: Payer: Medicare Other | Admitting: Internal Medicine

## 2013-10-31 ENCOUNTER — Encounter: Payer: Self-pay | Admitting: *Deleted

## 2013-10-31 ENCOUNTER — Other Ambulatory Visit: Payer: Medicare Other

## 2013-10-31 ENCOUNTER — Other Ambulatory Visit: Payer: Self-pay | Admitting: *Deleted

## 2013-10-31 DIAGNOSIS — E785 Hyperlipidemia, unspecified: Secondary | ICD-10-CM | POA: Diagnosis not present

## 2013-10-31 DIAGNOSIS — E039 Hypothyroidism, unspecified: Secondary | ICD-10-CM

## 2013-10-31 DIAGNOSIS — I10 Essential (primary) hypertension: Secondary | ICD-10-CM

## 2013-10-31 DIAGNOSIS — E1165 Type 2 diabetes mellitus with hyperglycemia: Secondary | ICD-10-CM

## 2013-10-31 DIAGNOSIS — IMO0001 Reserved for inherently not codable concepts without codable children: Secondary | ICD-10-CM

## 2013-11-01 LAB — LIPID PANEL
Chol/HDL Ratio: 3 ratio units (ref 0.0–4.4)
Cholesterol, Total: 178 mg/dL (ref 100–199)
HDL: 59 mg/dL (ref >39)
LDL Calculated: 84 mg/dL (ref 0–99)
Triglycerides: 174 mg/dL — ABNORMAL HIGH (ref 0–149)
VLDL Cholesterol Cal: 35 mg/dL (ref 5–40)

## 2013-11-01 LAB — TSH: TSH: 5.32 u[IU]/mL — ABNORMAL HIGH (ref 0.450–4.500)

## 2013-11-02 ENCOUNTER — Ambulatory Visit (INDEPENDENT_AMBULATORY_CARE_PROVIDER_SITE_OTHER): Payer: Medicare Other | Admitting: Internal Medicine

## 2013-11-02 ENCOUNTER — Encounter: Payer: Self-pay | Admitting: Internal Medicine

## 2013-11-02 VITALS — BP 144/72 | HR 62 | Temp 98.1°F | Resp 18 | Ht 63.5 in | Wt 210.8 lb

## 2013-11-02 DIAGNOSIS — M545 Low back pain, unspecified: Secondary | ICD-10-CM

## 2013-11-02 DIAGNOSIS — I10 Essential (primary) hypertension: Secondary | ICD-10-CM

## 2013-11-02 DIAGNOSIS — E669 Obesity, unspecified: Secondary | ICD-10-CM

## 2013-11-02 DIAGNOSIS — J4489 Other specified chronic obstructive pulmonary disease: Secondary | ICD-10-CM

## 2013-11-02 DIAGNOSIS — E1165 Type 2 diabetes mellitus with hyperglycemia: Secondary | ICD-10-CM

## 2013-11-02 DIAGNOSIS — IMO0001 Reserved for inherently not codable concepts without codable children: Secondary | ICD-10-CM

## 2013-11-02 DIAGNOSIS — R21 Rash and other nonspecific skin eruption: Secondary | ICD-10-CM

## 2013-11-02 DIAGNOSIS — J449 Chronic obstructive pulmonary disease, unspecified: Secondary | ICD-10-CM | POA: Insufficient documentation

## 2013-11-02 DIAGNOSIS — E785 Hyperlipidemia, unspecified: Secondary | ICD-10-CM

## 2013-11-02 DIAGNOSIS — E039 Hypothyroidism, unspecified: Secondary | ICD-10-CM | POA: Diagnosis not present

## 2013-11-02 DIAGNOSIS — R32 Unspecified urinary incontinence: Secondary | ICD-10-CM | POA: Insufficient documentation

## 2013-11-02 DIAGNOSIS — M6530 Trigger finger, unspecified finger: Secondary | ICD-10-CM

## 2013-11-02 DIAGNOSIS — R609 Edema, unspecified: Secondary | ICD-10-CM

## 2013-11-02 MED ORDER — DILTIAZEM HCL ER COATED BEADS 360 MG PO CP24: ORAL_CAPSULE | ORAL | Status: DC

## 2013-11-02 MED ORDER — FUROSEMIDE 40 MG PO TABS: ORAL_TABLET | ORAL | Status: DC

## 2013-11-02 MED ORDER — GLIMEPIRIDE 2 MG PO TABS: ORAL_TABLET | ORAL | Status: DC

## 2013-11-02 MED ORDER — OXYBUTYNIN CHLORIDE ER 10 MG PO TB24: ORAL_TABLET | ORAL | Status: DC

## 2013-11-02 MED ORDER — LEVOTHYROXINE SODIUM 25 MCG PO TABS: ORAL_TABLET | ORAL | Status: DC

## 2013-11-02 MED ORDER — FLUTICASONE-SALMETEROL 45-21 MCG/ACT IN AERO: INHALATION_SPRAY | RESPIRATORY_TRACT | Status: DC

## 2013-11-02 MED ORDER — MELOXICAM 15 MG PO TABS: ORAL_TABLET | ORAL | Status: DC

## 2013-11-02 MED ORDER — METFORMIN HCL 500 MG PO TABS: 500.0000 mg | ORAL_TABLET | Freq: Two times a day (BID) | ORAL | Status: DC

## 2013-11-02 MED ORDER — LISINOPRIL 20 MG PO TABS: ORAL_TABLET | ORAL | Status: DC

## 2013-11-02 MED ORDER — LOVASTATIN 40 MG PO TABS: ORAL_TABLET | ORAL | Status: DC

## 2013-11-02 MED ORDER — AMLODIPINE BESYLATE 10 MG PO TABS: ORAL_TABLET | ORAL | Status: DC

## 2013-11-02 NOTE — Progress Notes (Signed)
Patient ID: Patricia [name], female   DOB: January 03, 1936, 78 y.o.   MRN: [id number]    Location:     [facility]  Place of Service:   OFFICE    Allergies  Allergen Reactions  . Codeine Hives  . Vesicare [Solifenacin]     Chief Complaint  Patient presents with  . Follow-up    HPI:  Unspecified essential hypertension: controlled  Type II or unspecified type diabetes mellitus without mention of complication, uncontrolled : controlled. Patient says that when she uses glipizide, she frequently begins itching.   Unspecified hypothyroidism: mild elevation in TSH   Obesity, unspecified: gained 2# since last visit   Other and unspecified hyperlipidemia: controlled   Lumbago; improved. Back pains are better. She is not using Mobic.    Medications: Patient's Medications  New Prescriptions   No medications on file  Previous Medications   AMLODIPINE (NORVASC) 10 MG TABLET    Take 10 mg by mouth daily.   ASPIRIN 81 MG TABLET    Take 81 mg by mouth daily.   CEPHALEXIN (KEFLEX) 500 MG CAPSULE    Take 1 capsule (500 mg total) by mouth 2 (two) times daily.   CETIRIZINE (ZYRTEC) 10 MG TABLET    Take 10 mg by mouth daily.   DILTIAZEM HCL CD 360 MG 24 HR CAPSULE       ENALAPRIL (VASOTEC) 20 MG TABLET    Take 20 mg by mouth daily.   FLUTICASONE-SALMETEROL (ADVAIR HFA) 45-21 MCG/ACT INHALER    Inhale 2 puffs into the lungs 2 (two) times daily.   FUROSEMIDE (LASIX) 40 MG TABLET    Take 40 mg by mouth daily.   GLYBURIDE (DIABETA) 5 MG TABLET    One before breakfast and one before supper to control diabetes   LEVOTHYROXINE (SYNTHROID, LEVOTHROID) 25 MCG TABLET    Take 0.5 tablets (12.5 mcg total) by mouth daily.   LOVASTATIN (MEVACOR) 40 MG TABLET    Take 40 mg by mouth at bedtime.   MELOXICAM (MOBIC) 15 MG TABLET    One daily to help arthritis pains   METFORMIN (GLUCOPHAGE) 500 MG TABLET    Take 500 mg by mouth 2 (two) times daily with a meal. Take one tablet before breakfast and one tablet before  supper to control blood sugars   OXYBUTYNIN (DITROPAN-XL) 10 MG 24 HR TABLET    Take 10 mg by mouth daily.   POTASSIUM 95 MG TABS    Take by mouth.  Modified Medications   No medications on file  Discontinued Medications   METHOCARBAMOL (ROBAXIN) 500 MG TABLET    Take 1 tablet (500 mg total) by mouth 3 (three) times daily. Prn back pain   PREDNISONE (DELTASONE) 20 MG TABLET    Take 2 pills a day for 3 days, then 1 pill a day for 3 days     Review of Systems  Constitutional:       Gaining weight  HENT: Negative.   Eyes: Negative.   Respiratory: Negative.   Cardiovascular: Negative.   Gastrointestinal: Negative.   Endocrine:       Diabetic  Genitourinary: Negative.   Musculoskeletal: Positive for back pain.  Skin: Negative.   Allergic/Immunologic: Negative.   Neurological: Negative.   Hematological: Negative.   Psychiatric/Behavioral: Negative.     Filed Vitals:   11/02/13 1608  BP: 144/72  Pulse: 62  Temp: 98.1 F (36.7 C)  TempSrc: Oral  Resp: 18  Height: 5' 3.5" (1.613 m)  Weight:  210 lb 12.8 oz (95.618 kg)  SpO2: 93%   Physical Exam  Constitutional: She is oriented to person, place, and time. No distress.  Obese  HENT:  Head: Normocephalic and atraumatic.  Right Ear: External ear normal.  Left Ear: External ear normal.  Nose: Nose normal.  Mouth/Throat: Oropharynx is clear and moist.  Eyes: Conjunctivae and EOM are normal.  Neck: Normal range of motion. Neck supple. No JVD present. No tracheal deviation present. No thyromegaly present.  Cardiovascular: Normal rate, regular rhythm, normal heart sounds and intact distal pulses.  Exam reveals no gallop and no friction rub.   No murmur heard. Pulmonary/Chest: No respiratory distress. She has no wheezes. She exhibits no tenderness.  Abdominal: Bowel sounds are normal. She exhibits no distension and no mass. There is no tenderness.  Musculoskeletal: Normal range of motion. She exhibits edema. She exhibits no  tenderness.  Lymphadenopathy:    She has no cervical adenopathy.  Neurological: She is alert and oriented to person, place, and time. She has normal reflexes. No cranial nerve deficit. Coordination normal.  Skin: No rash noted. No erythema. No pallor.  Psychiatric: She has a normal mood and affect. Judgment and thought content normal.     Labs reviewed: Abstract on 10/31/2013  Component Date Value Range Status  . HM Colonoscopy 08/23/2012 [facility], poylps, repeat 3-5 yrs   Final  Appointment on 10/31/2013  Component Date Value Range Status  . TSH 10/31/2013 5.320* 0.450 - 4.500 uIU/mL Final  . Cholesterol, Total 10/31/2013 178  100 - 199 mg/dL Final  . Triglycerides 10/31/2013 174* 0 - 149 mg/dL Final  . HDL 10/31/2013 59  >39 mg/dL Final   Comment: According to ATP-III Guidelines, HDL-C >59 mg/dL is considered a                          negative risk factor for CHD.  Marland Kitchen VLDL Cholesterol Cal 10/31/2013 35  5 - 40 mg/dL Final  . LDL Calculated 10/31/2013 84  0 - 99 mg/dL Final  . Chol/HDL Ratio 10/31/2013 3.0  0.0 - 4.4 ratio units Final   Comment:                                   T. Chol/HDL Ratio                                                                      Men  Women                                                        1/2 Avg.Risk  3.4    3.3  Avg.Risk  5.0    4.4                                                         2X Avg.Risk  9.6    7.1                                                         3X Avg.Risk 23.4   11.0      Assessment/Plan 1. Lumbago improved - meloxicam (MOBIC) 15 MG tablet; One daily to help arthritis pains  Dispense: 90 tablet; Refill: 5  2. Type II or unspecified type diabetes mellitus without mention of complication, uncontrolled - metFORMIN (GLUCOPHAGE) 500 MG tablet; Take 1 tablet (500 mg total) by mouth 2 (two) times daily with a meal. Take one tablet before breakfast and one  tablet before supper to control blood sugars  Dispense: 180 tablet; Refill: 3 - Basic metabolic panel; Future - Hemoglobin A1c; Future - Microalbumin/Creatinine Ratio, Urine; Future - discontinued glyburide due to reports of itching and substituted glimepiride (AMARYL) 2 MG tablet; One at breakfast to control glucose  Dispense: 90 tablet; Refill: 4  3. Unspecified essential hypertension controlled - amLODipine (NORVASC) 10 MG tablet; One daily to control BP  Dispense: 90 tablet; Refill: 3 - lisinopril (PRINIVIL,ZESTRIL) 20 MG tablet; One daily to control BP  Dispense: 90 tablet; Refill: 3 - diltiazem (DILTIAZEM HCL CD) 360 MG 24 hr capsule; One daily to control BP  Dispense: 90 capsule; Refill: 4  4. Unspecified hypothyroidism Mild elevation in TSH. Will continue to monitor. No change in medication - levothyroxine (SYNTHROID, LEVOTHROID) 25 MCG tablet; 1 tablet daily for thyroid supplement  Dispense: 90 tablet; Refill: 3 - TSH; Future  5. Obesity, unspecified Encouraged dietary compliance  6. Chronic airway obstruction, not elsewhere classified Controlled on current medication - fluticasone-salmeterol (ADVAIR HFA) 45-21 MCG/ACT inhaler; 2 puffs inhaled twice daily to help breathing  Dispense: 3 Inhaler; Refill: 5  7. Edema controlled - furosemide (LASIX) 40 MG tablet; One daily to control edema  Dispense: 90 tablet; Refill: 3  8. Other and unspecified hyperlipidemia controlled - lovastatin (MEVACOR) 40 MG tablet; One at bedtime to control cholesterol  Dispense: 90 tablet; Refill: 3  9. Unspecified urinary incontinence controlled - oxybutynin (DITROPAN-XL) 10 MG 24 hr tablet; One daily to help bladder control  Dispense: 90 tablet; Refill: 3  10. Trigger finger, acquired - Ambulatory referral to Hand Surgery  11. Rash and nonspecific skin eruption - Ambulatory referral to Dermatology

## 2013-11-02 NOTE — Patient Instructions (Signed)
See dermatologist. Appointment will be scheduled.  See Dr. Sudie Bailey for your trigger finger.  See medication list for changes.

## 2013-11-14 ENCOUNTER — Other Ambulatory Visit: Payer: Self-pay

## 2013-11-14 DIAGNOSIS — R32 Unspecified urinary incontinence: Secondary | ICD-10-CM

## 2013-11-14 MED ORDER — OXYBUTYNIN CHLORIDE ER 10 MG PO TB24: ORAL_TABLET | ORAL | Status: DC

## 2013-11-15 ENCOUNTER — Other Ambulatory Visit: Payer: Self-pay | Admitting: *Deleted

## 2013-11-15 DIAGNOSIS — M545 Low back pain, unspecified: Secondary | ICD-10-CM

## 2013-11-15 DIAGNOSIS — E1165 Type 2 diabetes mellitus with hyperglycemia: Principal | ICD-10-CM

## 2013-11-15 DIAGNOSIS — IMO0001 Reserved for inherently not codable concepts without codable children: Secondary | ICD-10-CM

## 2013-11-15 MED ORDER — MELOXICAM 15 MG PO TABS: ORAL_TABLET | ORAL | Status: DC

## 2013-11-15 MED ORDER — METFORMIN HCL 500 MG PO TABS: ORAL_TABLET | ORAL | Status: DC

## 2013-11-15 MED ORDER — GLIMEPIRIDE 2 MG PO TABS: ORAL_TABLET | ORAL | Status: DC

## 2013-12-05 ENCOUNTER — Other Ambulatory Visit: Payer: Self-pay | Admitting: Dermatology

## 2013-12-05 DIAGNOSIS — L259 Unspecified contact dermatitis, unspecified cause: Secondary | ICD-10-CM | POA: Diagnosis not present

## 2013-12-05 DIAGNOSIS — L905 Scar conditions and fibrosis of skin: Secondary | ICD-10-CM | POA: Diagnosis not present

## 2013-12-06 DIAGNOSIS — E119 Type 2 diabetes mellitus without complications: Secondary | ICD-10-CM | POA: Diagnosis not present

## 2013-12-07 ENCOUNTER — Other Ambulatory Visit: Payer: Self-pay

## 2013-12-07 DIAGNOSIS — Z1231 Encounter for screening mammogram for malignant neoplasm of breast: Secondary | ICD-10-CM

## 2013-12-27 ENCOUNTER — Ambulatory Visit: Payer: Medicare Other

## 2013-12-29 DIAGNOSIS — M6530 Trigger finger, unspecified finger: Secondary | ICD-10-CM | POA: Diagnosis not present

## 2014-01-31 ENCOUNTER — Ambulatory Visit
Admission: RE | Admit: 2014-01-31 | Discharge: 2014-01-31 | Disposition: A | Discharge status: Home / Self Care | Payer: Medicare Other | Source: Ambulatory Visit

## 2014-01-31 DIAGNOSIS — Z1231 Encounter for screening mammogram for malignant neoplasm of breast: Secondary | ICD-10-CM | POA: Diagnosis not present

## 2014-02-05 ENCOUNTER — Encounter: Payer: Self-pay | Admitting: *Deleted

## 2014-02-06 DIAGNOSIS — L821 Other seborrheic keratosis: Secondary | ICD-10-CM | POA: Diagnosis not present

## 2014-02-06 DIAGNOSIS — D235 Other benign neoplasm of skin of trunk: Secondary | ICD-10-CM | POA: Diagnosis not present

## 2014-02-06 DIAGNOSIS — D1801 Hemangioma of skin and subcutaneous tissue: Secondary | ICD-10-CM | POA: Diagnosis not present

## 2014-02-06 DIAGNOSIS — L819 Disorder of pigmentation, unspecified: Secondary | ICD-10-CM | POA: Diagnosis not present

## 2014-03-06 ENCOUNTER — Other Ambulatory Visit: Payer: Medicare Other

## 2014-03-06 ENCOUNTER — Other Ambulatory Visit: Payer: Self-pay | Admitting: Internal Medicine

## 2014-03-06 DIAGNOSIS — E1139 Type 2 diabetes mellitus with other diabetic ophthalmic complication: Secondary | ICD-10-CM | POA: Diagnosis not present

## 2014-03-06 DIAGNOSIS — E039 Hypothyroidism, unspecified: Secondary | ICD-10-CM | POA: Diagnosis not present

## 2014-03-06 DIAGNOSIS — I10 Essential (primary) hypertension: Secondary | ICD-10-CM | POA: Diagnosis not present

## 2014-03-06 DIAGNOSIS — E1165 Type 2 diabetes mellitus with hyperglycemia: Secondary | ICD-10-CM | POA: Diagnosis not present

## 2014-03-08 ENCOUNTER — Ambulatory Visit (INDEPENDENT_AMBULATORY_CARE_PROVIDER_SITE_OTHER): Payer: Medicare Other | Admitting: Internal Medicine

## 2014-03-08 ENCOUNTER — Encounter: Payer: Self-pay | Admitting: Internal Medicine

## 2014-03-08 VITALS — BP 152/78 | HR 67 | Temp 98.3°F | Resp 18 | Ht 63.5 in | Wt 214.0 lb

## 2014-03-08 DIAGNOSIS — R21 Rash and other nonspecific skin eruption: Secondary | ICD-10-CM

## 2014-03-08 DIAGNOSIS — R32 Unspecified urinary incontinence: Secondary | ICD-10-CM

## 2014-03-08 DIAGNOSIS — IMO0001 Reserved for inherently not codable concepts without codable children: Secondary | ICD-10-CM

## 2014-03-08 DIAGNOSIS — R609 Edema, unspecified: Secondary | ICD-10-CM | POA: Diagnosis not present

## 2014-03-08 DIAGNOSIS — E1165 Type 2 diabetes mellitus with hyperglycemia: Secondary | ICD-10-CM

## 2014-03-08 DIAGNOSIS — E669 Obesity, unspecified: Secondary | ICD-10-CM | POA: Diagnosis not present

## 2014-03-08 DIAGNOSIS — M545 Low back pain, unspecified: Secondary | ICD-10-CM | POA: Diagnosis not present

## 2014-03-08 DIAGNOSIS — E039 Hypothyroidism, unspecified: Secondary | ICD-10-CM | POA: Diagnosis not present

## 2014-03-08 DIAGNOSIS — I10 Essential (primary) hypertension: Secondary | ICD-10-CM

## 2014-03-08 DIAGNOSIS — M6530 Trigger finger, unspecified finger: Secondary | ICD-10-CM

## 2014-03-08 DIAGNOSIS — E785 Hyperlipidemia, unspecified: Secondary | ICD-10-CM

## 2014-03-08 MED ORDER — GLUCOSE BLOOD VI STRP: ORAL_STRIP | Status: DC

## 2014-03-08 MED ORDER — OXYBUTYNIN CHLORIDE 5 MG PO TABS: ORAL_TABLET | ORAL | Status: DC

## 2014-03-08 MED ORDER — COMFORT LANCETS MISC: Status: DC

## 2014-03-08 NOTE — Progress Notes (Signed)
Patient ID: Patricia [name], female   DOB: 10-01-1936, 78 y.o.   MRN: [id number]    Location:  [facility]   Place of Service: OFFICE    Allergies  Allergen Reactions  . Codeine Hives  . Vesicare [Solifenacin]     Chief Complaint  Patient presents with  . Follow-up    HPI:  Right shoulder pain into the neck recently. Better today.  Dr. Allyson Sabal gave her a desoximetasone cream for her rash. It is better. He did a biopsy.  Unspecified essential hypertension: mildly elevated SBP. Asymptomatic.  Type II or unspecified type diabetes mellitus without mention of complication, uncontrolled: home glucose usually < 128m%.  Obesity, unspecified: no loss  Edema: has not used diuretic in 3 days. Does not like to use because of urgency and incontinence.  Her mail order pharmacy quit carrying oxybutynin 10 mg XL.    Medications: Patient's Medications  New Prescriptions   No medications on file  Previous Medications   AMLODIPINE (NORVASC) 10 MG TABLET    One daily to control BP   ASPIRIN 81 MG TABLET    Take 81 mg by mouth daily.   CETIRIZINE (ZYRTEC) 10 MG TABLET    Take 10 mg by mouth daily.   DESOXIMETASONE (TOPICORT) 0.25 % CREAM       DILTIAZEM (DILTIAZEM HCL CD) 360 MG 24 HR CAPSULE    One daily to control BP   FLUTICASONE-SALMETEROL (ADVAIR HFA) 45-21 MCG/ACT INHALER    2 puffs inhaled twice daily to help breathing   FUROSEMIDE (LASIX) 40 MG TABLET    One daily to control edema   GLIMEPIRIDE (AMARYL) 2 MG TABLET    One at breakfast to control glucose   LEVOTHYROXINE (SYNTHROID, LEVOTHROID) 25 MCG TABLET    1 tablet daily for thyroid supplement   LISINOPRIL (PRINIVIL,ZESTRIL) 20 MG TABLET    One daily to control BP   LOVASTATIN (MEVACOR) 40 MG TABLET    One at bedtime to control cholesterol   MELOXICAM (MOBIC) 15 MG TABLET    One daily to help arthritis pains   METFORMIN (GLUCOPHAGE) 500 MG TABLET    Take one tablet before breakfast and one tablet before supper to control blood  sugars   OXYBUTYNIN (DITROPAN-XL) 10 MG 24 HR TABLET    One daily to help bladder control   POTASSIUM 95 MG TABS    Take by mouth.  Modified Medications   No medications on file  Discontinued Medications   No medications on file     Review of Systems  Constitutional:       Gaining weight  HENT: Negative.   Eyes: Negative.   Respiratory: Negative.   Cardiovascular: Negative.   Gastrointestinal: Negative.   Endocrine:       Diabetic  Genitourinary: Negative.   Musculoskeletal: Positive for back pain.  Skin: Negative.   Allergic/Immunologic: Negative.   Neurological: Negative.   Hematological: Negative.   Psychiatric/Behavioral: Negative.     Filed Vitals:   03/08/14 1140  BP: 152/78  Pulse: 67  Temp: 98.3 F (36.8 C)  TempSrc: Oral  Resp: 18  Height: 5' 3.5" (1.613 m)  Weight: 214 lb (97.07 kg)  SpO2: 94%   Body mass index is 37.31 kg/(m^2).  Physical Exam  Constitutional: She is oriented to person, place, and time. No distress.  Obese  HENT:  Head: Normocephalic and atraumatic.  Right Ear: External ear normal.  Left Ear: External ear normal.  Nose: Nose normal.  Mouth/Throat: Oropharynx is  clear and moist.  Eyes: Conjunctivae and EOM are normal.  Neck: Normal range of motion. Neck supple. No JVD present. No tracheal deviation present. No thyromegaly present.  Cardiovascular: Normal rate, regular rhythm, normal heart sounds and intact distal pulses.  Exam reveals no gallop and no friction rub.   No murmur heard. Pulmonary/Chest: No respiratory distress. She has no wheezes. She exhibits no tenderness.  Abdominal: Bowel sounds are normal. She exhibits no distension and no mass. There is no tenderness.  Musculoskeletal: Normal range of motion. She exhibits edema. She exhibits no tenderness.  Lymphadenopathy:    She has no cervical adenopathy.  Neurological: She is alert and oriented to person, place, and time. She has normal reflexes. No cranial nerve deficit.  Coordination normal.  Skin: No rash noted. No erythema. No pallor.  Psychiatric: She has a normal mood and affect. Judgment and thought content normal.     Labs reviewed: No visits with results within 3 Month(s) from this visit. Latest known visit with results is:  Abstract on 10/31/2013  Component Date Value Ref Range Status  . HM Colonoscopy 08/23/2012 [facility], poylps, repeat 3-5 yrs   Final      Assessment/Plan  1. Unspecified essential hypertension continue current medicatoin  2. Type II or unspecified type diabetes mellitus without mention of complication, uncontrolled Emphasize weight loss and dietary compliance - COMFORT LANCETS MISC; Use daily to get blood for glucometer  Dispense: 100 each; Refill: 3 - Hemoglobin A1c; Future - CMP; Future - Microalbumin, urine; Future - glucose blood (BAYER CONTOUR TEST) test strip; Use daily too check glucose  Dispense: 100 each; Refill: 4  3. Obesity, unspecified Needs to lose weeight  4. Edema Use diuretic as needed  5. Rash and nonspecific skin eruption improved  6. Trigger finger, acquired Improved with injection by hand surgeon.  7. Unspecified hypothyroidism Mild elevation in the TSH - TSH; Future  8. Unspecified urinary incontinence - oxybutynin (DITROPAN) 5 MG tablet; One up to 3 times daily to help bladder control  Dispense: 90 tablet; Refill: 3  9. Other and unspecified hyperlipidemia - Lipid panel; Future

## 2014-03-08 NOTE — Patient Instructions (Signed)
Continue current medications.

## 2014-04-18 ENCOUNTER — Other Ambulatory Visit: Payer: Self-pay | Admitting: *Deleted

## 2014-04-18 ENCOUNTER — Telehealth: Payer: Self-pay | Admitting: *Deleted

## 2014-04-18 MED ORDER — PRAVASTATIN SODIUM 20 MG PO TABS: 20.0000 mg | ORAL_TABLET | Freq: Every day | ORAL | Status: DC

## 2014-04-18 NOTE — Telephone Encounter (Signed)
Patient returned call regarding her medication changes from the [employer] testing. She stated that she would prefer if medication came from her mail order pharmacy because it would be cheaper for her. Script was sent to [facility] by e-fax.

## 2014-04-18 NOTE — Telephone Encounter (Signed)
Called patient regarding pharmagenetic testing that she received with Dr. Nyoka Cowden.

## 2014-04-20 ENCOUNTER — Ambulatory Visit (INDEPENDENT_AMBULATORY_CARE_PROVIDER_SITE_OTHER): Payer: Medicare Other

## 2014-04-20 ENCOUNTER — Ambulatory Visit (INDEPENDENT_AMBULATORY_CARE_PROVIDER_SITE_OTHER): Payer: Medicare Other | Admitting: Family Medicine

## 2014-04-20 VITALS — BP 142/84 | HR 74 | Temp 98.2°F | Resp 17 | Ht 64.0 in | Wt 216.0 lb

## 2014-04-20 DIAGNOSIS — M25561 Pain in right knee: Secondary | ICD-10-CM

## 2014-04-20 DIAGNOSIS — M1710 Unilateral primary osteoarthritis, unspecified knee: Secondary | ICD-10-CM | POA: Diagnosis not present

## 2014-04-20 DIAGNOSIS — M25569 Pain in unspecified knee: Secondary | ICD-10-CM | POA: Diagnosis not present

## 2014-04-20 DIAGNOSIS — M1711 Unilateral primary osteoarthritis, right knee: Secondary | ICD-10-CM

## 2014-04-20 NOTE — Patient Instructions (Signed)
Pick up a cane to help with your balance, and use the knee brace to support your knee.  Apply ice to the knee a few times a day and continue to use your mobic.  If you are not doing better in the next few days please let me know- Sooner if worse.

## 2014-04-20 NOTE — Progress Notes (Signed)
[facility] [address] [place] [address] [id number]  Date:  04/20/2014   Name:  Patricia [name]   DOB:  1936/09/01   MRN:  [id number]  PCP:  Estill Dooms, MD    Chief Complaint: Knee Pain   History of Present Illness:  Patricia [name] is a 78 y.o. very pleasant female patient who presents with the following:  Most recent A1c that I can view was about one year ago, 7%.  She does still continue to see Dr. Nyoka Cowden for her health maintenance. She is here today with trouble with her right knee.  She has had some trouble with her knees for several years,  A couple of weeks ago she noted more pain in her knee, but this got much worse over the last 2 days.  No particular injury that she can recall. It hurts more along the medial joint line. She is not aware of any increased activity.    No prior history of knee surgery.   The knee is not clicking or popping, but does feel unstable.  It has not given way but feels unsteady.   No locking or getting stuck.   It is quite painful and hard for her to walk.    For otho she has seen Dr. Amedeo Plenty, does not have a non- hand doctor however  Patient Active Problem List   Diagnosis Date Noted  . Lumbago 11/02/2013  . Trigger finger, acquired 11/02/2013  . Edema 11/02/2013  . Other and unspecified hyperlipidemia 11/02/2013  . Unspecified urinary incontinence 11/02/2013  . Rash and nonspecific skin eruption 11/02/2013  . Chronic airway obstruction, not elsewhere classified   . Unspecified hypothyroidism   . Obesity, unspecified   . Unspecified essential hypertension 03/02/2013  . Type II or unspecified type diabetes mellitus without mention of complication, uncontrolled 03/02/2013    Past Medical History  Diagnosis Date  . Allergy   . Arthritis   . Asthma   . Diabetes mellitus without complication   . Heart murmur   . Thyroid disease   . History of aortic valve disease   . Diarrhea   . Urge incontinence   .  Lumbago   . Edema   . Cervicalgia   . Chronic airway obstruction, not elsewhere classified   . Pain in joint, lower leg   . Routine gynecological examination   . Scoliosis (and kyphoscoliosis), idiopathic   . Type II or unspecified type diabetes mellitus without mention of complication, uncontrolled   . Encounter for long-term (current) use of other medications   . Unspecified essential hypertension   . Other and unspecified hyperlipidemia   . Benign neoplasm of colon   . Unspecified urinary incontinence   . Anemia, unspecified   . Carpal tunnel syndrome   . Unspecified hemorrhoids without mention of complication   . Diffuse cystic mastopathy   . Unspecified hypothyroidism   . Obesity, unspecified   . Cellulitis and abscess of finger, unspecified   . Other psoriasis   . Type II or unspecified type diabetes mellitus without mention of complication, uncontrolled   . Cerumen impaction     Past Surgical History  Procedure Laterality Date  . Breast surgery      right breast 3 surgeries [phone]  . Cholecystectomy  1992    DR BOWMAN  . Colon surgery      2004,2007,2013.  3 times colon surgieres  . Abdominal hysterectomy  1987    complete  .  Vaginal cyst removed  1967  . 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 Foot surgery  1989  . Excise le mandibular lymph node t      DR C. NEWMAN  . Left shoulder arthroscopy    . Neuroplasty / transposition median nerve at carpal tunnel bilateral  05/2003    History  Substance Use Topics  . Smoking status: Former Smoker    Quit date: 09/07/1989  . Smokeless tobacco: Not on file  . Alcohol Use: No    Family History  Problem Relation Age of Onset  . Diabetes Father   . Stroke Father   . Heart disease Father   . Cancer Sister   . Heart disease Brother   . Asthma Sister   . Arthritis Sister     Allergies  Allergen Reactions  . Codeine Hives  . Vesicare [Solifenacin]     Medication list has been reviewed and  updated.  Current Outpatient Prescriptions on File Prior to Visit  Medication Sig Dispense Refill  . amLODipine (NORVASC) 10 MG tablet One daily to control BP  90 tablet  3  . aspirin 81 MG tablet Take 81 mg by mouth daily.      . cetirizine (ZYRTEC) 10 MG tablet Take 10 mg by mouth daily.      . COMFORT LANCETS MISC Use daily to get blood for glucometer  100 each  3  . desoximetasone (TOPICORT) 0.25 % cream       . diltiazem (DILTIAZEM HCL CD) 360 MG 24 hr capsule One daily to control BP  90 capsule  4  . fluticasone-salmeterol (ADVAIR HFA) 45-21 MCG/ACT inhaler 2 puffs inhaled twice daily to help breathing  3 Inhaler  5  . furosemide (LASIX) 40 MG tablet One daily to control edema  90 tablet  3  . glimepiride (AMARYL) 2 MG tablet One at breakfast to control glucose  90 tablet  3  . glucose blood (BAYER CONTOUR TEST) test strip Use daily too check glucose  100 each  4  . levothyroxine (SYNTHROID, LEVOTHROID) 25 MCG tablet 1 tablet daily for thyroid supplement  90 tablet  3  . lisinopril (PRINIVIL,ZESTRIL) 20 MG tablet One daily to control BP  90 tablet  3  . lovastatin (MEVACOR) 40 MG tablet One at bedtime to control cholesterol  90 tablet  3  . meloxicam (MOBIC) 15 MG tablet One daily to help arthritis pains  90 tablet  3  . metFORMIN (GLUCOPHAGE) 500 MG tablet Take one tablet before breakfast and one tablet before supper to control blood sugars  180 tablet  3  . oxybutynin (DITROPAN) 5 MG tablet One up to 3 times daily to help bladder control  90 tablet  3  . Potassium 95 MG TABS Take by mouth.      . pravastatin (PRAVACHOL) 20 MG tablet Take 1 tablet (20 mg total) by mouth daily.  90 tablet  3   No current facility-administered medications on file prior to visit.    Review of Systems:  As per HPI- otherwise negative.   Physical Examination: Filed Vitals:   04/20/14 1440  BP: 142/84  Pulse: 74  Temp: 98.2 F (36.8 C)  Resp: 17   Filed Vitals:   04/20/14 1440  Height: 5'  4" (1.626 m)  Weight: 216 lb (97.977 kg)   Body mass index is 37.06 kg/(m^2). Ideal Body Weight: Weight in (lb) to have BMI = 25: 145.3  GEN: WDWN, NAD, Non-toxic, A & O  x 3, obese, looks well HEENT: Atraumatic, Normocephalic. Neck supple. No masses, No LAD. Ears and Nose: No external deformity. CV: RRR, No M/G/R. No JVD. No thrill. No extra heart sounds. PULM: CTA B, no wheezes, crackles, rhonchi. No retractions. No resp. distress. No accessory muscle use. EXTR: No c/c/e NEURO Normal gait.  PSYCH: Normally interactive. Conversant. Not depressed or anxious appearing.  Calm demeanor.  Right knee: crepitus, tenderness more along the medial joint line.  No effusion, heat or redness.  Normal ROM, stable in all directions  [facility] reading (PRIMARY) by  Dr. Lorelei Pont. Right knee: degenerative change, more under the patella.   RIGHT KNEE - COMPLETE 4+ VIEW  COMPARISON: None.  FINDINGS: There is no evidence of fracture, dislocation, or joint effusion. There is no evidence of arthropathy or other focal bone abnormality. Soft tissues are unremarkable. Mild tricompartmental degenerative change noted. Trace suprapatellar fluid. The patella tracks appropriately at sunrise view.  IMPRESSION: No acute abnormality. Mild tricompartmental degenerative change with trace suprapatellar fluid.  Assessment and Plan: Pain in right knee - Plan: DG Knee Complete 4 Views Right  Primary osteoarthritis of right knee  Placed in a hinged knee brace, gave rx for quad cane. Ice and elevated knee.  Offered to have her see ortho- ?injection needed.  At this time she declines but will let me know if her sx do not remit.   Signed Lamar Blinks, MD

## 2014-04-21 ENCOUNTER — Telehealth: Payer: Self-pay

## 2014-04-21 DIAGNOSIS — M25561 Pain in right knee: Secondary | ICD-10-CM

## 2014-04-21 NOTE — Telephone Encounter (Signed)
Dr. Lorelei Pont,  Patient states her knee is no better and would like to get the cortisone injection.   [phone]

## 2014-04-24 NOTE — Telephone Encounter (Signed)
appt made at [facility] for her tomorrow 6/30 at 11am.  Pt informed.

## 2014-04-25 DIAGNOSIS — M1710 Unilateral primary osteoarthritis, unspecified knee: Secondary | ICD-10-CM | POA: Diagnosis not present

## 2014-04-26 ENCOUNTER — Other Ambulatory Visit (HOSPITAL_COMMUNITY): Payer: Self-pay | Admitting: Orthopedic Surgery

## 2014-04-26 DIAGNOSIS — M949 Disorder of cartilage, unspecified: Principal | ICD-10-CM

## 2014-04-26 DIAGNOSIS — M899 Disorder of bone, unspecified: Secondary | ICD-10-CM

## 2014-04-27 LAB — TSH: TSH: 5.44 u[IU]/mL — ABNORMAL HIGH (ref 0.450–4.500)

## 2014-04-27 LAB — BASIC METABOLIC PANEL
BUN/Creatinine Ratio: 27 — ABNORMAL HIGH (ref 11–26)
BUN: 21 mg/dL (ref 8–27)
CO2: 25 mmol/L (ref 18–29)
Calcium: 9.6 mg/dL (ref 8.7–10.3)
Chloride: 96 mmol/L — ABNORMAL LOW (ref 97–108)
Creatinine, Ser: 0.77 mg/dL (ref 0.57–1.00)
GFR calc Af Amer: 87 mL/min/{1.73_m2} (ref >59)
GFR calc non Af Amer: 76 mL/min/{1.73_m2} (ref >59)
Glucose: 139 mg/dL — ABNORMAL HIGH (ref 65–99)
Potassium: 5.2 mmol/L (ref 3.5–5.2)
Sodium: 139 mmol/L (ref 134–144)

## 2014-04-27 LAB — HGB A1C W/O EAG: Hgb A1c MFr Bld: 7.1 % — ABNORMAL HIGH (ref 4.8–5.6)

## 2014-05-04 ENCOUNTER — Ambulatory Visit (HOSPITAL_COMMUNITY): Payer: Medicare Other

## 2014-05-11 DIAGNOSIS — M6530 Trigger finger, unspecified finger: Secondary | ICD-10-CM | POA: Diagnosis not present

## 2014-05-23 DIAGNOSIS — M25649 Stiffness of unspecified hand, not elsewhere classified: Secondary | ICD-10-CM | POA: Diagnosis not present

## 2014-07-10 ENCOUNTER — Other Ambulatory Visit: Payer: Self-pay | Admitting: *Deleted

## 2014-07-10 DIAGNOSIS — IMO0001 Reserved for inherently not codable concepts without codable children: Secondary | ICD-10-CM

## 2014-07-10 DIAGNOSIS — E1165 Type 2 diabetes mellitus with hyperglycemia: Principal | ICD-10-CM

## 2014-07-10 NOTE — Telephone Encounter (Signed)
[facility] sent over a refill request for glipizide 38m One tablet by mouth before breakfast and one by mouth before supper to control diabetes. i do not see this medication in patient's chart. Called patient and she is unsure if taking so she will call back

## 2014-07-14 ENCOUNTER — Other Ambulatory Visit: Payer: Medicare Other

## 2014-07-14 DIAGNOSIS — E1165 Type 2 diabetes mellitus with hyperglycemia: Principal | ICD-10-CM

## 2014-07-14 DIAGNOSIS — E039 Hypothyroidism, unspecified: Secondary | ICD-10-CM

## 2014-07-14 DIAGNOSIS — IMO0001 Reserved for inherently not codable concepts without codable children: Secondary | ICD-10-CM | POA: Diagnosis not present

## 2014-07-14 DIAGNOSIS — E785 Hyperlipidemia, unspecified: Secondary | ICD-10-CM

## 2014-07-15 LAB — COMPREHENSIVE METABOLIC PANEL
ALT: 18 IU/L (ref 0–32)
AST: 17 IU/L (ref 0–40)
Albumin/Globulin Ratio: 1.7 (ref 1.1–2.5)
Albumin: 4.3 g/dL (ref 3.5–4.8)
Alkaline Phosphatase: 67 IU/L (ref 39–117)
BUN/Creatinine Ratio: 18 (ref 11–26)
BUN: 14 mg/dL (ref 8–27)
CO2: 24 mmol/L (ref 18–29)
Calcium: 9.3 mg/dL (ref 8.7–10.3)
Chloride: 99 mmol/L (ref 97–108)
Creatinine, Ser: 0.79 mg/dL (ref 0.57–1.00)
GFR calc Af Amer: 85 mL/min/{1.73_m2} (ref >59)
GFR calc non Af Amer: 73 mL/min/{1.73_m2} (ref >59)
Globulin, Total: 2.6 g/dL (ref 1.5–4.5)
Glucose: 131 mg/dL — ABNORMAL HIGH (ref 65–99)
Potassium: 4.8 mmol/L (ref 3.5–5.2)
Sodium: 139 mmol/L (ref 134–144)
Total Bilirubin: <0.2 mg/dL (ref 0.0–1.2)
Total Protein: 6.9 g/dL (ref 6.0–8.5)

## 2014-07-15 LAB — MICROALBUMIN, URINE: Microalbumin, Urine: 125.1 ug/mL — ABNORMAL HIGH (ref 0.0–17.0)

## 2014-07-15 LAB — HEMOGLOBIN A1C
Est. average glucose Bld gHb Est-mCnc: 154 mg/dL
Hgb A1c MFr Bld: 7 % — ABNORMAL HIGH (ref 4.8–5.6)

## 2014-07-15 LAB — LIPID PANEL
Chol/HDL Ratio: 3.1 ratio units (ref 0.0–4.4)
Cholesterol, Total: 180 mg/dL (ref 100–199)
HDL: 59 mg/dL (ref >39)
LDL Calculated: 90 mg/dL (ref 0–99)
Triglycerides: 156 mg/dL — ABNORMAL HIGH (ref 0–149)
VLDL Cholesterol Cal: 31 mg/dL (ref 5–40)

## 2014-07-15 LAB — TSH: TSH: 5.56 u[IU]/mL — ABNORMAL HIGH (ref 0.450–4.500)

## 2014-07-19 ENCOUNTER — Ambulatory Visit (INDEPENDENT_AMBULATORY_CARE_PROVIDER_SITE_OTHER): Payer: Medicare Other | Admitting: Internal Medicine

## 2014-07-19 ENCOUNTER — Encounter: Payer: Self-pay | Admitting: Internal Medicine

## 2014-07-19 VITALS — BP 150/60 | HR 82 | Temp 97.9°F | Resp 18 | Ht 64.0 in | Wt 215.0 lb

## 2014-07-19 DIAGNOSIS — IMO0001 Reserved for inherently not codable concepts without codable children: Secondary | ICD-10-CM

## 2014-07-19 DIAGNOSIS — Z23 Encounter for immunization: Secondary | ICD-10-CM

## 2014-07-19 DIAGNOSIS — E785 Hyperlipidemia, unspecified: Secondary | ICD-10-CM | POA: Diagnosis not present

## 2014-07-19 DIAGNOSIS — I10 Essential (primary) hypertension: Secondary | ICD-10-CM

## 2014-07-19 DIAGNOSIS — M25519 Pain in unspecified shoulder: Secondary | ICD-10-CM

## 2014-07-19 DIAGNOSIS — R609 Edema, unspecified: Secondary | ICD-10-CM

## 2014-07-19 DIAGNOSIS — M25569 Pain in unspecified knee: Secondary | ICD-10-CM

## 2014-07-19 DIAGNOSIS — E1165 Type 2 diabetes mellitus with hyperglycemia: Secondary | ICD-10-CM

## 2014-07-19 DIAGNOSIS — E039 Hypothyroidism, unspecified: Secondary | ICD-10-CM

## 2014-07-19 DIAGNOSIS — M25561 Pain in right knee: Secondary | ICD-10-CM

## 2014-07-19 DIAGNOSIS — M25511 Pain in right shoulder: Secondary | ICD-10-CM

## 2014-07-19 MED ORDER — FUROSEMIDE 40 MG PO TABS: ORAL_TABLET | ORAL | Status: DC

## 2014-07-19 MED ORDER — LEVOTHYROXINE SODIUM 50 MCG PO TABS: 50.0000 ug | ORAL_TABLET | Freq: Every day | ORAL | Status: DC

## 2014-07-19 NOTE — Progress Notes (Signed)
Patient ID: BENITA BOONSTRA, female   DOB: 05/15/1936, 78 y.o.   MRN: [id number]    Location: [facility]    Place of Service: OFFICE     Allergies  Allergen Reactions  . Codeine Hives  . Vesicare [Solifenacin]     Chief Complaint  Patient presents with  . Medical Management of Chronic Issues    rt leg swells in am, knee hurt, rt shoulder throbs at night,    HPI:  Edema - chronic and distressing to her. Most likely etiology is venous insufficiency  Need for prophylactic vaccination and inoculation against influenza  Type II or unspecified type diabetes mellitus without mention of complication, uncontrolled: continues to be overweight and admits to noncompliance with diet  Other and unspecified hyperlipidemia: controlled  Unspecified essential hypertension: mild elevation in SBP  Unspecified hypothyroidism - TSH is still a little high  Pain in joint, shoulder region, right: chronic discomfort that is getting worse. Painful to abduct and rotate.  Right knee pain: has seen Dr. Eddie Dibbles, but would like a 2nd opinion. He offered to inject with Hyalogen    Medications: Patient's Medications  New Prescriptions   No medications on file  Previous Medications   AMLODIPINE (NORVASC) 10 MG TABLET    One daily to control BP   ASPIRIN 81 MG TABLET    Take 81 mg by mouth daily.   CETIRIZINE (ZYRTEC) 10 MG TABLET    Take 10 mg by mouth daily.   COMFORT LANCETS MISC    Use daily to get blood for glucometer   DESOXIMETASONE (TOPICORT) 0.25 % CREAM       DILTIAZEM (DILTIAZEM HCL CD) 360 MG 24 HR CAPSULE    One daily to control BP   FLUTICASONE-SALMETEROL (ADVAIR HFA) 45-21 MCG/ACT INHALER    2 puffs inhaled twice daily to help breathing   FUROSEMIDE (LASIX) 40 MG TABLET    One daily to control edema   GLIMEPIRIDE (AMARYL) 2 MG TABLET    One at breakfast to control glucose   GLUCOSE BLOOD (BAYER CONTOUR TEST) TEST STRIP    Use daily too check glucose   LEVOTHYROXINE (SYNTHROID, LEVOTHROID) 25  MCG TABLET    1 tablet daily for thyroid supplement   LISINOPRIL (PRINIVIL,ZESTRIL) 20 MG TABLET    One daily to control BP   LOVASTATIN (MEVACOR) 40 MG TABLET    One at bedtime to control cholesterol   MELOXICAM (MOBIC) 15 MG TABLET    One daily to help arthritis pains   METFORMIN (GLUCOPHAGE) 500 MG TABLET    Take one tablet before breakfast and one tablet before supper to control blood sugars   OXYBUTYNIN (DITROPAN) 5 MG TABLET    One up to 3 times daily to help bladder control   POTASSIUM 95 MG TABS    Take by mouth.   PRAVASTATIN (PRAVACHOL) 20 MG TABLET    Take 1 tablet (20 mg total) by mouth daily.  Modified Medications   No medications on file  Discontinued Medications   No medications on file     Review of Systems  Constitutional:       Gaining weight  HENT: Negative.   Eyes: Negative.   Respiratory: Negative.   Cardiovascular: Negative.   Gastrointestinal: Negative.   Endocrine:       Diabetic  Genitourinary: Negative.   Musculoskeletal: Positive for arthralgias and back pain.       Pain in the right shoulder. History of surgery in the past by Dr. Shellia Carwin.  Has seen Dr. Eddie Dibbles about pain in the right knee. Got injection but it did not help. He talked about using a gel injection. Knee gives out sometimes. Using a cane at times.  Skin: Negative.   Allergic/Immunologic: Negative.   Neurological: Negative.   Hematological: Negative.   Psychiatric/Behavioral: Negative.     Filed Vitals:   07/19/14 1600  BP: 150/60  Pulse: 82  Temp: 97.9 F (36.6 C)  TempSrc: Oral  Resp: 18  Height: _0  (1.626 m)  Weight: 215 lb (97.523 kg)  SpO2: 92%   Body mass index is 36.89 kg/(m^2).  Physical Exam  Constitutional: She is oriented to person, place, and time. No distress.  Obese  HENT:  Head: Normocephalic and atraumatic.  Right Ear: External ear normal.  Left Ear: External ear normal.  Nose: Nose normal.  Mouth/Throat: Oropharynx is clear and moist.  Eyes:  Conjunctivae and EOM are normal.  Neck: Normal range of motion. Neck supple. No JVD present. No tracheal deviation present. No thyromegaly present.  Cardiovascular: Normal rate, regular rhythm, normal heart sounds and intact distal pulses.  Exam reveals no gallop and no friction rub.   No murmur heard. Pulmonary/Chest: No respiratory distress. She has no wheezes. She exhibits no tenderness.  Abdominal: Bowel sounds are normal. She exhibits no distension and no mass. There is no tenderness.  Musculoskeletal: Normal range of motion. She exhibits edema. She exhibits no tenderness.  Pain in the right shoulder with rotational movement   Lymphadenopathy:    She has no cervical adenopathy.  Neurological: She is alert and oriented to person, place, and time. She has normal reflexes. No cranial nerve deficit. Coordination normal.  Skin: No rash noted. No erythema. No pallor.  Psychiatric: She has a normal mood and affect. Judgment and thought content normal.     Labs reviewed: Appointment on 07/14/2014  Component Date Value Ref Range Status  . Hemoglobin A1C 07/14/2014 7.0* 4.8 - 5.6 % Final   Comment:          Increased risk for diabetes: 5.7 - 6.4                                   Diabetes: >6.4                                   Glycemic control for adults with diabetes: <7.0  . Estimated average glucose 07/14/2014 154   Final  . Glucose 07/14/2014 131* 65 - 99 mg/dL Final  . BUN 07/14/2014 14  8 - 27 mg/dL Final  . Creatinine, Ser 07/14/2014 0.79  0.57 - 1.00 mg/dL Final  . GFR calc non Af Amer 07/14/2014 73  >59 mL/min/1.73 Final  . GFR calc Af Amer 07/14/2014 85  >59 mL/min/1.73 Final  . BUN/Creatinine Ratio 07/14/2014 18  11 - 26 Final  . Sodium 07/14/2014 139  134 - 144 mmol/L Final  . Potassium 07/14/2014 4.8  3.5 - 5.2 mmol/L Final  . Chloride 07/14/2014 99  97 - 108 mmol/L Final  . CO2 07/14/2014 24  18 - 29 mmol/L Final  . Calcium 07/14/2014 9.3  8.7 - 10.3 mg/dL Final  . Total  Protein 07/14/2014 6.9  6.0 - 8.5 g/dL Final  . Albumin 07/14/2014 4.3  3.5 - 4.8 g/dL Final  . Globulin, Total 07/14/2014 2.6  1.5 -  4.5 g/dL Final  . Albumin/Globulin Ratio 07/14/2014 1.7  1.1 - 2.5 Final  . Total Bilirubin 07/14/2014 <0.2  0.0 - 1.2 mg/dL Final  . Alkaline Phosphatase 07/14/2014 67  39 - 117 IU/L Final  . AST 07/14/2014 17  0 - 40 IU/L Final  . ALT 07/14/2014 18  0 - 32 IU/L Final  . Cholesterol, Total 07/14/2014 180  100 - 199 mg/dL Final  . Triglycerides 07/14/2014 156* 0 - 149 mg/dL Final  . HDL 07/14/2014 59  >39 mg/dL Final   Comment: According to ATP-III Guidelines, HDL-C >59 mg/dL is considered a                          negative risk factor for CHD.  Marland Kitchen VLDL Cholesterol Cal 07/14/2014 31  5 - 40 mg/dL Final  . LDL Calculated 07/14/2014 90  0 - 99 mg/dL Final  . Chol/HDL Ratio 07/14/2014 3.1  0.0 - 4.4 ratio units Final   Comment:                                   T. Chol/HDL Ratio                                                                      Men  Women                                                        1/2 Avg.Risk  3.4    3.3                                                            Avg.Risk  5.0    4.4                                                         2X Avg.Risk  9.6    7.1                                                         3X Avg.Risk 23.4   11.0  . Microalbum.,U,Random 07/14/2014 125.1* 0.0 - 17.0 ug/mL Final  . TSH 07/14/2014 5.560* 0.450 - 4.500 uIU/mL Final     Assessment/Plan  1. Edema Discussed compression stockings, but she says she does not think she can use them due to back pain and difficulty of putting them on and removing them. - trial of furosemide (LASIX) 40  MG tablet; One daily to control edema  Dispense: 90 tablet; Refill: 3  2. Need for prophylactic vaccination and inoculation against influenza  3. Type II or unspecified type diabetes mellitus without mention of complication, uncontrolled Continue to work on  diet - Hemoglobin A1c; Future - Basic metabolic panel; Future  4. Other and unspecified hyperlipidemia controlled  5. Unspecified essential hypertension Adequately controlled  6. Unspecified hypothyroidism increase levothyroxine - levothyroxine (SYNTHROID, LEVOTHROID) 50 MCG tablet; Take 1 tablet (50 mcg total) by mouth daily.  Dispense: 90 tablet; Refill: 3 - TSH; Future  7. Pain in joint, shoulder region, right Offered ortho referral to Dr. Mardelle Matte. She wants to think about it.  8. Knee pain, right Offered ortho referral to Dr. Mardelle Matte. She wants to think about it.

## 2014-07-20 DIAGNOSIS — M25561 Pain in right knee: Secondary | ICD-10-CM | POA: Insufficient documentation

## 2014-07-20 DIAGNOSIS — M25562 Pain in left knee: Secondary | ICD-10-CM

## 2014-10-16 ENCOUNTER — Other Ambulatory Visit: Payer: Medicare Other

## 2014-10-16 DIAGNOSIS — E1165 Type 2 diabetes mellitus with hyperglycemia: Secondary | ICD-10-CM | POA: Diagnosis not present

## 2014-10-16 DIAGNOSIS — E039 Hypothyroidism, unspecified: Secondary | ICD-10-CM

## 2014-10-16 DIAGNOSIS — IMO0002 Reserved for concepts with insufficient information to code with codable children: Secondary | ICD-10-CM

## 2014-10-17 LAB — BASIC METABOLIC PANEL
BUN/Creatinine Ratio: 24 (ref 11–26)
BUN: 20 mg/dL (ref 8–27)
CO2: 23 mmol/L (ref 18–29)
Calcium: 9.5 mg/dL (ref 8.7–10.3)
Chloride: 102 mmol/L (ref 97–108)
Creatinine, Ser: 0.83 mg/dL (ref 0.57–1.00)
GFR calc Af Amer: 79 mL/min/{1.73_m2} (ref >59)
GFR calc non Af Amer: 69 mL/min/{1.73_m2} (ref >59)
Glucose: 117 mg/dL — ABNORMAL HIGH (ref 65–99)
Potassium: 5.2 mmol/L (ref 3.5–5.2)
Sodium: 140 mmol/L (ref 134–144)

## 2014-10-17 LAB — HEMOGLOBIN A1C
Est. average glucose Bld gHb Est-mCnc: 157 mg/dL
Hgb A1c MFr Bld: 7.1 % — ABNORMAL HIGH (ref 4.8–5.6)

## 2014-10-17 LAB — TSH: TSH: 4.51 u[IU]/mL — ABNORMAL HIGH (ref 0.450–4.500)

## 2014-10-18 ENCOUNTER — Encounter: Payer: Self-pay | Admitting: Internal Medicine

## 2014-10-18 ENCOUNTER — Ambulatory Visit (INDEPENDENT_AMBULATORY_CARE_PROVIDER_SITE_OTHER): Payer: Medicare Other | Admitting: Internal Medicine

## 2014-10-18 VITALS — BP 142/62 | HR 86 | Temp 97.5°F | Resp 18 | Ht 64.0 in | Wt 218.2 lb

## 2014-10-18 DIAGNOSIS — R609 Edema, unspecified: Secondary | ICD-10-CM

## 2014-10-18 DIAGNOSIS — M25511 Pain in right shoulder: Secondary | ICD-10-CM | POA: Diagnosis not present

## 2014-10-18 DIAGNOSIS — E039 Hypothyroidism, unspecified: Secondary | ICD-10-CM

## 2014-10-18 DIAGNOSIS — M545 Low back pain, unspecified: Secondary | ICD-10-CM

## 2014-10-18 DIAGNOSIS — M25562 Pain in left knee: Secondary | ICD-10-CM

## 2014-10-18 DIAGNOSIS — E119 Type 2 diabetes mellitus without complications: Secondary | ICD-10-CM

## 2014-10-18 DIAGNOSIS — M25561 Pain in right knee: Secondary | ICD-10-CM

## 2014-10-18 DIAGNOSIS — E785 Hyperlipidemia, unspecified: Secondary | ICD-10-CM

## 2014-10-18 DIAGNOSIS — I10 Essential (primary) hypertension: Secondary | ICD-10-CM

## 2014-10-18 DIAGNOSIS — E669 Obesity, unspecified: Secondary | ICD-10-CM | POA: Diagnosis not present

## 2014-10-18 MED ORDER — MELOXICAM 15 MG PO TABS
ORAL_TABLET | ORAL | Status: DC
Start: 2014-10-18 — End: 2015-09-26

## 2014-10-18 MED ORDER — DOXAZOSIN MESYLATE 4 MG PO TABS: ORAL_TABLET | ORAL | Status: DC

## 2014-10-18 MED ORDER — TRAMADOL HCL 50 MG PO TABS: ORAL_TABLET | ORAL | Status: DC

## 2014-10-18 NOTE — Progress Notes (Signed)
Patient ID: Patricia [name], female   DOB: 1936-08-30, 78 y.o.   MRN: [id number]    Facility  [facility]    Place of Service:   OFFICE   Allergies  Allergen Reactions  . Codeine Hives  . Vesicare [Solifenacin]     Chief Complaint  Patient presents with  . Medical Management of Chronic Issues    HPI:  Pain in the knees and shoulders. Wants ortho appt. Would like to see Dr. Mardelle Matte. Hurts at night. Less mobile. Has used Hydrocodone/ APAP. Got nauseous, dizzy, and groggy. Has used meloxicam.   Type 2 diabetes mellitus without complication: dietary noncompliance.  Essential hypertension: controlled. Diltiazem and amlodipine are likely contributing to edema.  Edema: 2+ bilateral  Midline low back pain without sciatica: unchanged  Obesity: gained 3# in last 3 months  Hypothyroidism, unspecified hypothyroidism type: TSH closer to normal    Medications: Patient's Medications  New Prescriptions   No medications on file  Previous Medications   AMLODIPINE (NORVASC) 10 MG TABLET    One daily to control BP   ASPIRIN 81 MG TABLET    Take 81 mg by mouth daily.   CETIRIZINE (ZYRTEC) 10 MG TABLET    Take 10 mg by mouth daily.   COMFORT LANCETS MISC    Use daily to get blood for glucometer   DESOXIMETASONE (TOPICORT) 0.25 % CREAM       DICLOFENAC (CATAFLAM) 50 MG TABLET    Take 50 mg by mouth 2 (two) times daily.   DILTIAZEM (DILTIAZEM HCL CD) 360 MG 24 HR CAPSULE    One daily to control BP   FLUTICASONE-SALMETEROL (ADVAIR HFA) 45-21 MCG/ACT INHALER    2 puffs inhaled twice daily to help breathing   FUROSEMIDE (LASIX) 40 MG TABLET    One daily to control edema   GLIMEPIRIDE (AMARYL) 2 MG TABLET    One at breakfast to control glucose   GLUCOSE BLOOD (BAYER CONTOUR TEST) TEST STRIP    Use daily too check glucose   LEVOTHYROXINE (SYNTHROID, LEVOTHROID) 50 MCG TABLET    Take 1 tablet (50 mcg total) by mouth daily.   LISINOPRIL (PRINIVIL,ZESTRIL) 20 MG TABLET    One daily to control BP   LOVASTATIN (MEVACOR) 40 MG TABLET    One at bedtime to control cholesterol   MELOXICAM (MOBIC) 15 MG TABLET    One daily to help arthritis pains   METFORMIN (GLUCOPHAGE) 500 MG TABLET    Take one tablet before breakfast and one tablet before supper to control blood sugars   OXYBUTYNIN (DITROPAN) 5 MG TABLET    One up to 3 times daily to help bladder control   POTASSIUM 95 MG TABS    Take by mouth.   PRAVASTATIN (PRAVACHOL) 20 MG TABLET    Take 1 tablet (20 mg total) by mouth daily.  Modified Medications   No medications on file  Discontinued Medications   No medications on file     Review of Systems  Constitutional:       Gaining weight  HENT: Negative.   Eyes: Negative.   Respiratory: Negative.   Cardiovascular: Negative.   Gastrointestinal: Negative.   Endocrine:       Diabetic  Genitourinary: Negative.   Musculoskeletal: Positive for back pain and arthralgias.       Pain in the right shoulder. History of surgery in the past by Dr. Shellia Carwin. Has seen Dr. Eddie Dibbles about pain in the right knee. Got injection but it did not help.  He talked about using a gel injection. Knee gives out sometimes. Using a cane at times.  Skin: Negative.   Allergic/Immunologic: Negative.   Neurological: Negative.   Hematological: Negative.   Psychiatric/Behavioral: Negative.     Filed Vitals:   10/18/14 1146  BP: 142/62  Pulse: 86  Temp: 97.5 F (36.4 C)  TempSrc: Oral  Resp: 18  Height: _0  (1.626 m)  Weight: 218 lb 3.2 oz (98.975 kg)  SpO2: 94%   Body mass index is 37.44 kg/(m^2).  Physical Exam  Constitutional: She is oriented to person, place, and time. No distress.  Obese  HENT:  Head: Normocephalic and atraumatic.  Right Ear: External ear normal.  Left Ear: External ear normal.  Nose: Nose normal.  Mouth/Throat: Oropharynx is clear and moist.  Eyes: Conjunctivae and EOM are normal.  Neck: Normal range of motion. Neck supple. No JVD present. No tracheal deviation present. No  thyromegaly present.  Cardiovascular: Normal rate, regular rhythm, normal heart sounds and intact distal pulses.  Exam reveals no gallop and no friction rub.   No murmur heard. Pulmonary/Chest: No respiratory distress. She has no wheezes. She exhibits no tenderness.  Abdominal: Bowel sounds are normal. She exhibits no distension and no mass. There is no tenderness.  Musculoskeletal: Normal range of motion. She exhibits edema. She exhibits no tenderness.  Pain in the both shoulders (R>L) with rotational movement. Pain in both knees (R>L).  Lymphadenopathy:    She has no cervical adenopathy.  Neurological: She is alert and oriented to person, place, and time. She has normal reflexes. No cranial nerve deficit. Coordination normal.  Skin: No rash noted. No erythema. No pallor.  Psychiatric: She has a normal mood and affect. Judgment and thought content normal.     Labs reviewed: Lab on 10/16/2014  Component Date Value Ref Range Status  . TSH 10/16/2014 4.510* 0.450 - 4.500 uIU/mL Final  . Hgb A1c MFr Bld 10/16/2014 7.1* 4.8 - 5.6 % Final   Comment:          Pre-diabetes: 5.7 - 6.4          Diabetes: >6.4          Glycemic control for adults with diabetes: <7.0   . Est. average glucose Bld gHb Est-m* 10/16/2014 157   Final  . Glucose 10/16/2014 117* 65 - 99 mg/dL Final  . BUN 10/16/2014 20  8 - 27 mg/dL Final  . Creatinine, Ser 10/16/2014 0.83  0.57 - 1.00 mg/dL Final  . GFR calc non Af Amer 10/16/2014 69  >59 mL/min/1.73 Final  . GFR calc Af Amer 10/16/2014 79  >59 mL/min/1.73 Final  . BUN/Creatinine Ratio 10/16/2014 24  11 - 26 Final  . Sodium 10/16/2014 140  134 - 144 mmol/L Final  . Potassium 10/16/2014 5.2  3.5 - 5.2 mmol/L Final  . Chloride 10/16/2014 102  97 - 108 mmol/L Final  . CO2 10/16/2014 23  18 - 29 mmol/L Final  . Calcium 10/16/2014 9.5  8.7 - 10.3 mg/dL Final     Assessment/Plan  1. Type 2 diabetes mellitus without complication Follow diet - Hemoglobin A1c;  Future - Basic metabolic panel; Future - Microalbumin, urine; Future  2. Essential hypertension Stopped amlodipine due to edema. Add Cardura. - Basic metabolic panel; Future - doxazosin (CARDURA) 4 MG tablet; One daily to help control BP  Dispense: 90 tablet; Refill: 3  3. Pain in joint, shoulder region, right - meloxicam (MOBIC) 15 MG tablet; One daily to  help arthritis pains  Dispense: 90 tablet; Refill: 3 - traMADol (ULTRAM) 50 MG tablet; One up to to 4 times daily for pain  Dispense: 120 tablet; Refill: 0 - Ambulatory referral to Orthopedic Surgery  4. Knee pain, bilateral - meloxicam (MOBIC) 15 MG tablet; One daily to help arthritis pains  Dispense: 90 tablet; Refill: 3 - traMADol (ULTRAM) 50 MG tablet; One up to to 4 times daily for pain  Dispense: 120 tablet; Refill: 0 - Ambulatory referral to Orthopedic Surgery  5. Edema Stop amlodipine. The diltiazem may be part of the problem, but will leave it alone for now.  6. Midline low back pain without sciatica Use tramadol and Mobic  7. Obesity Follow diet  8. Hypothyroidism, unspecified hypothyroidism type - TSH; Future  9. Hyperlipidemia - Lipid panel; Future

## 2014-10-25 DIAGNOSIS — M19011 Primary osteoarthritis, right shoulder: Secondary | ICD-10-CM | POA: Diagnosis not present

## 2014-11-27 ENCOUNTER — Other Ambulatory Visit: Payer: Self-pay | Admitting: *Deleted

## 2014-11-27 DIAGNOSIS — I10 Essential (primary) hypertension: Secondary | ICD-10-CM

## 2014-11-27 MED ORDER — DILTIAZEM HCL ER COATED BEADS 360 MG PO CP24: ORAL_CAPSULE | ORAL | Status: DC

## 2014-11-27 NOTE — Telephone Encounter (Signed)
[facility] 

## 2014-11-27 NOTE — Telephone Encounter (Signed)
[facility]

## 2014-12-06 DIAGNOSIS — M170 Bilateral primary osteoarthritis of knee: Secondary | ICD-10-CM | POA: Diagnosis not present

## 2014-12-14 DIAGNOSIS — H2513 Age-related nuclear cataract, bilateral: Secondary | ICD-10-CM | POA: Diagnosis not present

## 2014-12-14 DIAGNOSIS — E119 Type 2 diabetes mellitus without complications: Secondary | ICD-10-CM | POA: Diagnosis not present

## 2014-12-14 LAB — HM DIABETES EYE EXAM

## 2014-12-15 ENCOUNTER — Encounter: Payer: Self-pay | Admitting: *Deleted

## 2015-01-06 ENCOUNTER — Other Ambulatory Visit: Payer: Self-pay | Admitting: Internal Medicine

## 2015-01-08 ENCOUNTER — Other Ambulatory Visit: Payer: Self-pay | Admitting: *Deleted

## 2015-01-08 DIAGNOSIS — I10 Essential (primary) hypertension: Secondary | ICD-10-CM

## 2015-01-08 MED ORDER — LISINOPRIL 20 MG PO TABS
ORAL_TABLET | ORAL | Status: DC
Start: 2015-01-08 — End: 2015-11-25

## 2015-01-08 NOTE — Telephone Encounter (Signed)
[facility] 

## 2015-01-18 ENCOUNTER — Other Ambulatory Visit: Payer: Self-pay | Admitting: *Deleted

## 2015-01-18 DIAGNOSIS — J449 Chronic obstructive pulmonary disease, unspecified: Secondary | ICD-10-CM

## 2015-01-18 MED ORDER — FLUTICASONE-SALMETEROL 100-50 MCG/DOSE IN AEPB: INHALATION_SPRAY | RESPIRATORY_TRACT | Status: DC

## 2015-01-18 MED ORDER — FLUTICASONE-SALMETEROL 45-21 MCG/ACT IN AERO
INHALATION_SPRAY | RESPIRATORY_TRACT | Status: DC
Start: 2015-01-18 — End: 2015-01-18

## 2015-01-18 NOTE — Telephone Encounter (Signed)
Patient called and requested refill to be faxed to pharmacy.

## 2015-01-18 NOTE — Telephone Encounter (Signed)
Patient stated that she is on Advair 100/50 not the 45/21. Patient stated that she has always taken the 100/50. Corrected in chart and faxed to pharmacy.

## 2015-01-22 ENCOUNTER — Other Ambulatory Visit: Payer: Medicare Other

## 2015-01-22 DIAGNOSIS — E785 Hyperlipidemia, unspecified: Secondary | ICD-10-CM | POA: Diagnosis not present

## 2015-01-22 DIAGNOSIS — E039 Hypothyroidism, unspecified: Secondary | ICD-10-CM | POA: Diagnosis not present

## 2015-01-22 DIAGNOSIS — E119 Type 2 diabetes mellitus without complications: Secondary | ICD-10-CM

## 2015-01-22 DIAGNOSIS — I10 Essential (primary) hypertension: Secondary | ICD-10-CM | POA: Diagnosis not present

## 2015-01-23 LAB — BASIC METABOLIC PANEL
BUN/Creatinine Ratio: 23 (ref 11–26)
BUN: 20 mg/dL (ref 8–27)
CO2: 23 mmol/L (ref 18–29)
Calcium: 9.6 mg/dL (ref 8.7–10.3)
Chloride: 95 mmol/L — ABNORMAL LOW (ref 97–108)
Creatinine, Ser: 0.88 mg/dL (ref 0.57–1.00)
GFR calc Af Amer: 74 mL/min/{1.73_m2} (ref >59)
GFR calc non Af Amer: 64 mL/min/{1.73_m2} (ref >59)
Glucose: 132 mg/dL — ABNORMAL HIGH (ref 65–99)
Potassium: 5 mmol/L (ref 3.5–5.2)
Sodium: 137 mmol/L (ref 134–144)

## 2015-01-23 LAB — LIPID PANEL
Chol/HDL Ratio: 3.4 ratio units (ref 0.0–4.4)
Cholesterol, Total: 199 mg/dL (ref 100–199)
HDL: 59 mg/dL (ref >39)
LDL Calculated: 99 mg/dL (ref 0–99)
Triglycerides: 204 mg/dL — ABNORMAL HIGH (ref 0–149)
VLDL Cholesterol Cal: 41 mg/dL — ABNORMAL HIGH (ref 5–40)

## 2015-01-23 LAB — MICROALBUMIN, URINE: Microalbumin, Urine: 57.5 ug/mL — ABNORMAL HIGH (ref 0.0–17.0)

## 2015-01-23 LAB — HEMOGLOBIN A1C
Est. average glucose Bld gHb Est-mCnc: 154 mg/dL
Hgb A1c MFr Bld: 7 % — ABNORMAL HIGH (ref 4.8–5.6)

## 2015-01-23 LAB — TSH: TSH: 4.55 u[IU]/mL — ABNORMAL HIGH (ref 0.450–4.500)

## 2015-01-24 ENCOUNTER — Ambulatory Visit (INDEPENDENT_AMBULATORY_CARE_PROVIDER_SITE_OTHER): Payer: Medicare Other | Admitting: Internal Medicine

## 2015-01-24 ENCOUNTER — Encounter: Payer: Self-pay | Admitting: Internal Medicine

## 2015-01-24 VITALS — BP 138/86 | HR 72 | Temp 97.6°F | Ht 64.0 in | Wt 216.4 lb

## 2015-01-24 DIAGNOSIS — M25511 Pain in right shoulder: Secondary | ICD-10-CM

## 2015-01-24 DIAGNOSIS — E039 Hypothyroidism, unspecified: Secondary | ICD-10-CM

## 2015-01-24 DIAGNOSIS — E119 Type 2 diabetes mellitus without complications: Secondary | ICD-10-CM

## 2015-01-24 DIAGNOSIS — E669 Obesity, unspecified: Secondary | ICD-10-CM | POA: Diagnosis not present

## 2015-01-24 DIAGNOSIS — I10 Essential (primary) hypertension: Secondary | ICD-10-CM

## 2015-01-24 DIAGNOSIS — E785 Hyperlipidemia, unspecified: Secondary | ICD-10-CM

## 2015-01-24 DIAGNOSIS — M545 Low back pain, unspecified: Secondary | ICD-10-CM

## 2015-01-24 DIAGNOSIS — R609 Edema, unspecified: Secondary | ICD-10-CM

## 2015-01-24 MED ORDER — FLUTICASONE-SALMETEROL 100-50 MCG/DOSE IN AEPB: INHALATION_SPRAY | RESPIRATORY_TRACT | Status: DC

## 2015-01-24 MED ORDER — ALBUTEROL SULFATE HFA 108 (90 BASE) MCG/ACT IN AERS: 2.0000 | INHALATION_SPRAY | Freq: Four times a day (QID) | RESPIRATORY_TRACT | Status: DC | PRN

## 2015-01-24 NOTE — Progress Notes (Signed)
Patient ID: Patricia [name], female   DOB: [birth year], 79 y.o.   MRN: [id number]    Facility  [facility]    Place of Service:   OFFICE   Allergies  Allergen Reactions  . Codeine Hives  . Vesicare [Solifenacin]     Chief Complaint  Patient presents with  . Medical Management of Chronic Issues    3 month Follow up. Complains of trouble breathing due to allergies.    HPI:  Allergies are worse. Wheeze, rhinorrhea, junk in eyes, sneeze, cough.  Type 2 diabetes mellitus without complication - controlled   Edema: Resolved after discontinuing amlodipine.  Essential hypertension: Blood pressure remains under control even after discontinuing amlodipine. Remains on doxazosin, diltiazem, and lisinopril.  Hyperlipidemia - controlled on pravastatin  Hypothyroidism, unspecified hypothyroidism type - compensated on levothyroxine  Midline low back pain without sciatica: Chronic and stable  Obesity: Not losing weight. Noncompliant with diet.  Pain in joint, shoulder region, right: Saw Dr. Mardelle Matte. He recommended joint replacement. She also talked to him about her knees and he feels that she will ultimately come to knee replacement as well. She did not mention her chronic back pain.    Medications: Patient's Medications  New Prescriptions   No medications on file  Previous Medications   ASPIRIN 81 MG TABLET    Take 81 mg by mouth daily.   CETIRIZINE (ZYRTEC) 10 MG TABLET    Take 10 mg by mouth daily.   COMFORT LANCETS MISC    Use daily to get blood for glucometer   DESOXIMETASONE (TOPICORT) 0.25 % CREAM       DILTIAZEM (DILTIAZEM HCL CD) 360 MG 24 HR CAPSULE    Take one tablet by mouth once daily to control blood pressure   DOXAZOSIN (CARDURA) 4 MG TABLET    One daily to help control BP   FUROSEMIDE (LASIX) 40 MG TABLET    One daily to control edema   GLIMEPIRIDE (AMARYL) 2 MG TABLET    Take 1 tablet by mouth at  breakfast to control  glucose   GLUCOSE BLOOD (BAYER CONTOUR TEST) TEST STRIP     Use daily too check glucose   LEVOTHYROXINE (SYNTHROID, LEVOTHROID) 50 MCG TABLET    Take 1 tablet (50 mcg total) by mouth daily.   LISINOPRIL (PRINIVIL,ZESTRIL) 20 MG TABLET    Take one tablet by mouth once daily for blood pressure   LOVASTATIN (MEVACOR) 40 MG TABLET    One at bedtime to control cholesterol   MELOXICAM (MOBIC) 15 MG TABLET    One daily to help arthritis pains   METFORMIN (GLUCOPHAGE) 500 MG TABLET    Take one tablet before  breakfast and one tablet  before supper to control  blood sugars   OXYBUTYNIN (DITROPAN) 5 MG TABLET    One up to 3 times daily to help bladder control   POTASSIUM 95 MG TABS    Take by mouth.   TRAMADOL (ULTRAM) 50 MG TABLET    One up to to 4 times daily for pain  Modified Medications   Modified Medication Previous Medication   FLUTICASONE-SALMETEROL (ADVAIR DISKUS) 100-50 MCG/DOSE AEPB Fluticasone-Salmeterol (ADVAIR DISKUS) 100-50 MCG/DOSE AEPB      Inhale one puff twice daily for breathing    Inhale one puff twice daily for breathing  Discontinued Medications   AMLODIPINE (NORVASC) 10 MG TABLET    One daily to control BP   PRAVASTATIN (PRAVACHOL) 20 MG TABLET    Take 1 tablet (20 mg  total) by mouth daily.     Review of Systems  Constitutional:       Gaining weight  HENT: Negative.   Eyes: Negative.   Respiratory: Negative.   Cardiovascular: Negative.   Gastrointestinal: Negative.   Endocrine:       Diabetic  Genitourinary: Negative.   Musculoskeletal: Positive for back pain and arthralgias.       Pain in the right shoulder. History of surgery in the past by Dr. Shellia Carwin. Has seen Dr. Eddie Dibbles about pain in the right knee. Got injection but it did not help. He talked about using a gel injection. Knee gives out sometimes. Using a cane at times.  Skin: Negative.   Allergic/Immunologic: Negative.   Neurological: Negative.   Hematological: Negative.   Psychiatric/Behavioral: Negative.     Filed Vitals:   01/24/15 1208  BP: 138/86  Pulse:  72  Temp: 97.6 F (36.4 C)  TempSrc: Oral  Height: _0  (1.626 m)  Weight: 216 lb 6.4 oz (98.158 kg)  SpO2: 97%   Body mass index is 37.13 kg/(m^2).  Physical Exam  Constitutional: She is oriented to person, place, and time. No distress.  Obese  HENT:  Head: Normocephalic and atraumatic.  Right Ear: External ear normal.  Left Ear: External ear normal.  Nose: Nose normal.  Mouth/Throat: Oropharynx is clear and moist.  Eyes: Conjunctivae and EOM are normal.  Neck: Normal range of motion. Neck supple. No JVD present. No tracheal deviation present. No thyromegaly present.  Cardiovascular: Normal rate, regular rhythm, normal heart sounds and intact distal pulses.  Exam reveals no gallop and no friction rub.   No murmur heard. Pulmonary/Chest: No respiratory distress. She has no wheezes. She exhibits no tenderness.  Abdominal: Bowel sounds are normal. She exhibits no distension and no mass. There is no tenderness.  Musculoskeletal: Normal range of motion. She exhibits edema. She exhibits no tenderness.  Pain in the both shoulders (R>L) with rotational movement. Pain in both knees (R>L).  Lymphadenopathy:    She has no cervical adenopathy.  Neurological: She is alert and oriented to person, place, and time. She has normal reflexes. No cranial nerve deficit. Coordination normal.  Skin: No rash noted. No erythema. No pallor.  Psychiatric: She has a normal mood and affect. Judgment and thought content normal.     Labs reviewed: Appointment on 01/22/2015  Component Date Value Ref Range Status  . Hgb A1c MFr Bld 01/22/2015 7.0* 4.8 - 5.6 % Final   Comment:          Pre-diabetes: 5.7 - 6.4          Diabetes: >6.4          Glycemic control for adults with diabetes: <7.0   . Est. average glucose Bld gHb Est-m* 01/22/2015 154   Final  . Glucose 01/22/2015 132* 65 - 99 mg/dL Final  . BUN 01/22/2015 20  8 - 27 mg/dL Final  . Creatinine, Ser 01/22/2015 0.88  0.57 - 1.00 mg/dL Final  .  GFR calc non Af Amer 01/22/2015 64  >59 mL/min/1.73 Final  . GFR calc Af Amer 01/22/2015 74  >59 mL/min/1.73 Final  . BUN/Creatinine Ratio 01/22/2015 23  11 - 26 Final  . Sodium 01/22/2015 137  134 - 144 mmol/L Final  . Potassium 01/22/2015 5.0  3.5 - 5.2 mmol/L Final  . Chloride 01/22/2015 95* 97 - 108 mmol/L Final  . CO2 01/22/2015 23  18 - 29 mmol/L Final  . Calcium 01/22/2015 9.6  8.7 - 10.3 mg/dL Final  . Microalbum.,U,Random 01/22/2015 57.5* 0.0 - 17.0 ug/mL Final  . Cholesterol, Total 01/22/2015 199  100 - 199 mg/dL Final  . Triglycerides 01/22/2015 204* 0 - 149 mg/dL Final  . HDL 01/22/2015 59  >39 mg/dL Final   Comment: According to ATP-III Guidelines, HDL-C >59 mg/dL is considered a negative risk factor for CHD.   Marland Kitchen VLDL Cholesterol Cal 01/22/2015 41* 5 - 40 mg/dL Final  . LDL Calculated 01/22/2015 99  0 - 99 mg/dL Final  . Chol/HDL Ratio 01/22/2015 3.4  0.0 - 4.4 ratio units Final   Comment:                                   T. Chol/HDL Ratio                                             Men  Women                               1/2 Avg.Risk  3.4    3.3                                   Avg.Risk  5.0    4.4                                2X Avg.Risk  9.6    7.1                                3X Avg.Risk 23.4   11.0   . TSH 01/22/2015 4.550* 0.450 - 4.500 uIU/mL Final  Abstract on 12/15/2014  Component Date Value Ref Range Status  . HM Diabetic Eye Exam 12/14/2014 No Retinopathy  No Retinopathy Final     Assessment/Plan  1. Type 2 diabetes mellitus without complication Controlled - Hemoglobin A1c; Future - Basic metabolic panel; Future - Microalbumin, urine; Future  2. Edema Resolved  3. Essential hypertension Controlled  4. Hyperlipidemia Controlled - Lipid panel; Future  5. Hypothyroidism, unspecified hypothyroidism type Compensated - TSH; Future  6. Midline low back pain without sciatica Unchanged  7. Obesity No weight loss  8. Pain in joint,  shoulder region, right Patient is not eager to have surgery. She will continue to think about this problem.

## 2015-04-13 ENCOUNTER — Other Ambulatory Visit: Payer: Self-pay

## 2015-04-13 DIAGNOSIS — Z1231 Encounter for screening mammogram for malignant neoplasm of breast: Secondary | ICD-10-CM

## 2015-04-23 ENCOUNTER — Other Ambulatory Visit: Payer: Self-pay | Admitting: Internal Medicine

## 2015-05-06 ENCOUNTER — Other Ambulatory Visit: Payer: Self-pay | Admitting: Internal Medicine

## 2015-05-11 ENCOUNTER — Ambulatory Visit
Admission: RE | Admit: 2015-05-11 | Discharge: 2015-05-11 | Disposition: A | Discharge status: Home / Self Care | Payer: Medicare Other | Source: Ambulatory Visit

## 2015-05-11 DIAGNOSIS — Z1231 Encounter for screening mammogram for malignant neoplasm of breast: Secondary | ICD-10-CM

## 2015-05-21 ENCOUNTER — Other Ambulatory Visit: Payer: Medicare Other

## 2015-05-21 DIAGNOSIS — E785 Hyperlipidemia, unspecified: Secondary | ICD-10-CM | POA: Diagnosis not present

## 2015-05-21 DIAGNOSIS — E119 Type 2 diabetes mellitus without complications: Secondary | ICD-10-CM

## 2015-05-21 DIAGNOSIS — E039 Hypothyroidism, unspecified: Secondary | ICD-10-CM | POA: Diagnosis not present

## 2015-05-22 LAB — BASIC METABOLIC PANEL
BUN/Creatinine Ratio: 23 (ref 11–26)
BUN: 23 mg/dL (ref 8–27)
CO2: 23 mmol/L (ref 18–29)
Calcium: 9.6 mg/dL (ref 8.7–10.3)
Chloride: 97 mmol/L (ref 97–108)
Creatinine, Ser: 0.98 mg/dL (ref 0.57–1.00)
GFR calc Af Amer: 65 mL/min/{1.73_m2} (ref >59)
GFR calc non Af Amer: 56 mL/min/{1.73_m2} — ABNORMAL LOW (ref >59)
Glucose: 146 mg/dL — ABNORMAL HIGH (ref 65–99)
Potassium: 5.6 mmol/L — ABNORMAL HIGH (ref 3.5–5.2)
Sodium: 138 mmol/L (ref 134–144)

## 2015-05-22 LAB — TSH: TSH: 2.11 u[IU]/mL (ref 0.450–4.500)

## 2015-05-22 LAB — LIPID PANEL
Chol/HDL Ratio: 3.2 ratio units (ref 0.0–4.4)
Cholesterol, Total: 163 mg/dL (ref 100–199)
HDL: 51 mg/dL (ref >39)
LDL Calculated: 71 mg/dL (ref 0–99)
Triglycerides: 204 mg/dL — ABNORMAL HIGH (ref 0–149)
VLDL Cholesterol Cal: 41 mg/dL — ABNORMAL HIGH (ref 5–40)

## 2015-05-22 LAB — MICROALBUMIN, URINE: Microalbumin, Urine: 22.4 ug/mL

## 2015-05-22 LAB — HEMOGLOBIN A1C
Est. average glucose Bld gHb Est-mCnc: 166 mg/dL
Hgb A1c MFr Bld: 7.4 % — ABNORMAL HIGH (ref 4.8–5.6)

## 2015-05-23 ENCOUNTER — Ambulatory Visit (INDEPENDENT_AMBULATORY_CARE_PROVIDER_SITE_OTHER): Payer: Medicare Other | Admitting: Internal Medicine

## 2015-05-23 ENCOUNTER — Encounter: Payer: Self-pay | Admitting: Internal Medicine

## 2015-05-23 VITALS — BP 132/76 | HR 70 | Temp 98.0°F | Resp 20 | Ht 64.0 in | Wt 216.6 lb

## 2015-05-23 DIAGNOSIS — R609 Edema, unspecified: Secondary | ICD-10-CM | POA: Diagnosis not present

## 2015-05-23 DIAGNOSIS — I10 Essential (primary) hypertension: Secondary | ICD-10-CM

## 2015-05-23 DIAGNOSIS — E119 Type 2 diabetes mellitus without complications: Secondary | ICD-10-CM | POA: Diagnosis not present

## 2015-05-23 DIAGNOSIS — M542 Cervicalgia: Secondary | ICD-10-CM | POA: Diagnosis not present

## 2015-05-23 DIAGNOSIS — M25562 Pain in left knee: Secondary | ICD-10-CM | POA: Diagnosis not present

## 2015-05-23 DIAGNOSIS — M545 Low back pain, unspecified: Secondary | ICD-10-CM

## 2015-05-23 DIAGNOSIS — E039 Hypothyroidism, unspecified: Secondary | ICD-10-CM

## 2015-05-23 DIAGNOSIS — N393 Stress incontinence (female) (male): Secondary | ICD-10-CM | POA: Diagnosis not present

## 2015-05-23 DIAGNOSIS — E669 Obesity, unspecified: Secondary | ICD-10-CM

## 2015-05-23 DIAGNOSIS — M25561 Pain in right knee: Secondary | ICD-10-CM | POA: Diagnosis not present

## 2015-05-23 DIAGNOSIS — E785 Hyperlipidemia, unspecified: Secondary | ICD-10-CM | POA: Diagnosis not present

## 2015-05-23 HISTORY — DX: Cervicalgia: M54.2

## 2015-05-23 MED ORDER — MIRABEGRON ER 25 MG PO TB24: ORAL_TABLET | ORAL | Status: DC

## 2015-05-23 NOTE — Progress Notes (Signed)
Patient ID: Patricia [name], female   DOB: [birth year], 79 y.o.   MRN: [id number]    Facility  [facility]    Place of Service:   OFFICE    Allergies  Allergen Reactions  . Codeine Hives  . Vesicare [Solifenacin]     Chief Complaint  Patient presents with  . Medical Management of Chronic Issues    4 month follow-up    HPI:    Type 2 diabetes mellitus without complication: Rising hemoglobin A1c at 7.4. Fasting glucose 146. Not watching her diet. Gaining weight.  Essential hypertension: Controlled on current medication  Edema: Unchanged  Midline low back pain without sciatica: Chronic condition. Nondisabling. It does interfere with her activity levels Sundays.  Hyperlipidemia: Cholesterol and LDL under control on current medication. Triglycerides slightly high.  Obesity: Poor dietary compliance. Continues to gain weight.  Neck pain - Left sided neck pain for a couple of weeks, but has had it off and on for years.  Stress incontinence: Continues to be an issue. Denies dysuria.  Hypothyroidism, unspecified hypothyroidism type - controlled on current dose of supplement.  Knee pain, bilateral - discomfort with walking. Comfortable at rest. No joint effusion or swelling.    Medications: Patient's Medications  New Prescriptions   No medications on file  Previous Medications   ALBUTEROL (PROVENTIL HFA;VENTOLIN HFA) 108 (90 BASE) MCG/ACT INHALER    Inhale 2 puffs into the lungs every 6 (six) hours as needed for wheezing or shortness of breath.   ASPIRIN 81 MG TABLET    Take 81 mg by mouth daily.   CETIRIZINE (ZYRTEC) 10 MG TABLET    Take 10 mg by mouth daily.   COMFORT LANCETS MISC    Use daily to get blood for glucometer   DESOXIMETASONE (TOPICORT) 0.25 % CREAM       DILTIAZEM (DILTIAZEM HCL CD) 360 MG 24 HR CAPSULE    Take one tablet by mouth once daily to control blood pressure   DOXAZOSIN (CARDURA) 4 MG TABLET    One daily to help control BP   FLUTICASONE-SALMETEROL (ADVAIR  DISKUS) 100-50 MCG/DOSE AEPB    Inhale one puff twice daily for breathing   FUROSEMIDE (LASIX) 40 MG TABLET    One daily to control edema   GLIMEPIRIDE (AMARYL) 2 MG TABLET    Take 1 tablet by mouth at  breakfast to control  glucose   GLUCOSE BLOOD (BAYER CONTOUR TEST) TEST STRIP    Use daily too check glucose   LEVOTHYROXINE (SYNTHROID, LEVOTHROID) 50 MCG TABLET    Take 1 tablet (50 mcg total) by mouth daily.   LISINOPRIL (PRINIVIL,ZESTRIL) 20 MG TABLET    Take one tablet by mouth once daily for blood pressure   LOVASTATIN (MEVACOR) 40 MG TABLET    One at bedtime to control cholesterol   MELOXICAM (MOBIC) 15 MG TABLET    One daily to help arthritis pains   METFORMIN (GLUCOPHAGE) 500 MG TABLET    Take one tablet before  breakfast and one tablet  before supper to control  blood sugars   OXYBUTYNIN (DITROPAN) 5 MG TABLET    Take 1 tablet by mouth up  to 3 times daily to help  bladder control   POTASSIUM 95 MG TABS    Take by mouth.   PRAVASTATIN (PRAVACHOL) 20 MG TABLET    Take 1 tablet by mouth  daily   TRAMADOL (ULTRAM) 50 MG TABLET    One up to to 4 times daily for pain  Modified Medications   No medications on file  Discontinued Medications   No medications on file     Review of Systems  Constitutional:       Gaining weight  HENT: Negative.   Eyes: Negative.   Respiratory: Negative.   Cardiovascular: Negative.   Gastrointestinal: Negative.   Endocrine:       Diabetic. Hypothyroid.  Genitourinary: Negative.   Musculoskeletal: Positive for back pain and arthralgias.       Pain in the right shoulder. History of surgery in the past by Dr. Shellia Carwin. Has seen Dr. Eddie Dibbles about pain in the right knee. Got injection but it did not help. He talked about using a gel injection. Knee gives out sometimes. Using a cane at times. Recurrent problems with mild discomfort.  Skin: Negative.   Allergic/Immunologic: Negative.   Neurological: Negative.   Hematological: Negative.     Psychiatric/Behavioral: Negative.     Filed Vitals:   05/23/15 1243  BP: 132/76  Pulse: 70  Temp: 98 F (36.7 C)  TempSrc: Oral  Resp: 20  Height: _0  (1.626 m)  Weight: 216 lb 9.6 oz (98.249 kg)  SpO2: 94%   Body mass index is 37.16 kg/(m^2).  Physical Exam  Constitutional: She is oriented to person, place, and time. No distress.  Obese  HENT:  Head: Normocephalic and atraumatic.  Right Ear: External ear normal.  Left Ear: External ear normal.  Nose: Nose normal.  Mouth/Throat: Oropharynx is clear and moist.  Eyes: Conjunctivae and EOM are normal.  Neck: Normal range of motion. Neck supple. No JVD present. No tracheal deviation present. No thyromegaly present.  Cardiovascular: Normal rate, regular rhythm, normal heart sounds and intact distal pulses.  Exam reveals no gallop and no friction rub.   No murmur heard. Pulmonary/Chest: No respiratory distress. She has no wheezes. She exhibits no tenderness.  Abdominal: Bowel sounds are normal. She exhibits no distension and no mass. There is no tenderness.  Musculoskeletal: Normal range of motion. She exhibits edema. She exhibits no tenderness.  Pain in the both shoulders (R>L) with rotational movement. Pain in both knees (R>L).  Lymphadenopathy:    She has no cervical adenopathy.  Neurological: She is alert and oriented to person, place, and time. She has normal reflexes. No cranial nerve deficit. Coordination normal.  Skin: No rash noted. No erythema. No pallor.  Psychiatric: She has a normal mood and affect. Judgment and thought content normal.     Labs reviewed: Appointment on 05/21/2015  Component Date Value Ref Range Status  . Hgb A1c MFr Bld 05/21/2015 7.4* 4.8 - 5.6 % Final   Comment:          Pre-diabetes: 5.7 - 6.4          Diabetes: >6.4          Glycemic control for adults with diabetes: <7.0   . Est. average glucose Bld gHb Est-m* 05/21/2015 166   Final  . Glucose 05/21/2015 146* 65 - 99 mg/dL Final  .  BUN 05/21/2015 23  8 - 27 mg/dL Final  . Creatinine, Ser 05/21/2015 0.98  0.57 - 1.00 mg/dL Final  . GFR calc non Af Amer 05/21/2015 56* >59 mL/min/1.73 Final  . GFR calc Af Amer 05/21/2015 65  >59 mL/min/1.73 Final  . BUN/Creatinine Ratio 05/21/2015 23  11 - 26 Final  . Sodium 05/21/2015 138  134 - 144 mmol/L Final  . Potassium 05/21/2015 5.6* 3.5 - 5.2 mmol/L Final  . Chloride 05/21/2015 97  97 - 108 mmol/L Final  . CO2 05/21/2015 23  18 - 29 mmol/L Final  . Calcium 05/21/2015 9.6  8.7 - 10.3 mg/dL Final  . Cholesterol, Total 05/21/2015 163  100 - 199 mg/dL Final  . Triglycerides 05/21/2015 204* 0 - 149 mg/dL Final  . HDL 05/21/2015 51  >39 mg/dL Final   Comment: According to ATP-III Guidelines, HDL-C >59 mg/dL is considered a negative risk factor for CHD.   Marland Kitchen VLDL Cholesterol Cal 05/21/2015 41* 5 - 40 mg/dL Final  . LDL Calculated 05/21/2015 71  0 - 99 mg/dL Final  . Chol/HDL Ratio 05/21/2015 3.2  0.0 - 4.4 ratio units Final   Comment:                                   T. Chol/HDL Ratio                                             Men  Women                               1/2 Avg.Risk  3.4    3.3                                   Avg.Risk  5.0    4.4                                2X Avg.Risk  9.6    7.1                                3X Avg.Risk 23.4   11.0   . TSH 05/21/2015 2.110  0.450 - 4.500 uIU/mL Final  . Microalbum.,U,Random 05/21/2015 22.4  Not Estab. ug/mL Final     Assessment/Plan  1. Type 2 diabetes mellitus without complication Continue current medication. Dietary compliance stressed. - Comprehensive metabolic panel; Future - Hemoglobin A1c; Future  2. Essential hypertension Continue current medication - Comprehensive metabolic panel; Future  3. Edema Unchanged  4. Midline low back pain without sciatica Mild. Intermittent intensity. No change in orders.  5. Hyperlipidemia Continue current medication, pravastatin. - Lipid panel; Future  6.  Obesity Encourage dietary compliance  7. Neck pain Recommended liniments and massage  8. Stress incontinence New medication ordered - mirabegron ER (MYRBETRIQ) 25 MG TB24 tablet; One daily to help bladder control  Dispense: 30 tablet; Refill: 5  9. Hypothyroidism, unspecified hypothyroidism type Continue current levothyroxine dose - TSH; Future  10. Knee pain, bilateral Try Aspercreme or Myoflex. Try Aleve twice daily.

## 2015-06-21 DIAGNOSIS — M25561 Pain in right knee: Secondary | ICD-10-CM | POA: Diagnosis not present

## 2015-06-21 DIAGNOSIS — M25562 Pain in left knee: Secondary | ICD-10-CM | POA: Diagnosis not present

## 2015-06-21 DIAGNOSIS — M170 Bilateral primary osteoarthritis of knee: Secondary | ICD-10-CM | POA: Diagnosis not present

## 2015-06-26 DIAGNOSIS — M25561 Pain in right knee: Secondary | ICD-10-CM | POA: Diagnosis not present

## 2015-06-26 DIAGNOSIS — M1711 Unilateral primary osteoarthritis, right knee: Secondary | ICD-10-CM | POA: Diagnosis not present

## 2015-06-29 DIAGNOSIS — M25562 Pain in left knee: Secondary | ICD-10-CM | POA: Diagnosis not present

## 2015-06-29 DIAGNOSIS — M170 Bilateral primary osteoarthritis of knee: Secondary | ICD-10-CM | POA: Diagnosis not present

## 2015-06-29 DIAGNOSIS — M25561 Pain in right knee: Secondary | ICD-10-CM | POA: Diagnosis not present

## 2015-06-29 DIAGNOSIS — R2689 Other abnormalities of gait and mobility: Secondary | ICD-10-CM | POA: Diagnosis not present

## 2015-06-29 DIAGNOSIS — M1712 Unilateral primary osteoarthritis, left knee: Secondary | ICD-10-CM | POA: Diagnosis not present

## 2015-07-04 ENCOUNTER — Other Ambulatory Visit: Payer: Self-pay | Admitting: Internal Medicine

## 2015-07-04 DIAGNOSIS — R2689 Other abnormalities of gait and mobility: Secondary | ICD-10-CM | POA: Diagnosis not present

## 2015-07-04 DIAGNOSIS — M25561 Pain in right knee: Secondary | ICD-10-CM | POA: Diagnosis not present

## 2015-07-04 DIAGNOSIS — M170 Bilateral primary osteoarthritis of knee: Secondary | ICD-10-CM | POA: Diagnosis not present

## 2015-07-04 DIAGNOSIS — M1711 Unilateral primary osteoarthritis, right knee: Secondary | ICD-10-CM | POA: Diagnosis not present

## 2015-07-04 DIAGNOSIS — M25562 Pain in left knee: Secondary | ICD-10-CM | POA: Diagnosis not present

## 2015-07-06 DIAGNOSIS — M170 Bilateral primary osteoarthritis of knee: Secondary | ICD-10-CM | POA: Diagnosis not present

## 2015-07-06 DIAGNOSIS — M25562 Pain in left knee: Secondary | ICD-10-CM | POA: Diagnosis not present

## 2015-07-06 DIAGNOSIS — M1712 Unilateral primary osteoarthritis, left knee: Secondary | ICD-10-CM | POA: Diagnosis not present

## 2015-07-06 DIAGNOSIS — R2689 Other abnormalities of gait and mobility: Secondary | ICD-10-CM | POA: Diagnosis not present

## 2015-07-06 DIAGNOSIS — M25561 Pain in right knee: Secondary | ICD-10-CM | POA: Diagnosis not present

## 2015-07-10 DIAGNOSIS — L309 Dermatitis, unspecified: Secondary | ICD-10-CM | POA: Diagnosis not present

## 2015-07-10 DIAGNOSIS — D225 Melanocytic nevi of trunk: Secondary | ICD-10-CM | POA: Diagnosis not present

## 2015-07-10 DIAGNOSIS — D1801 Hemangioma of skin and subcutaneous tissue: Secondary | ICD-10-CM | POA: Diagnosis not present

## 2015-07-10 DIAGNOSIS — M25562 Pain in left knee: Secondary | ICD-10-CM | POA: Diagnosis not present

## 2015-07-10 DIAGNOSIS — M1711 Unilateral primary osteoarthritis, right knee: Secondary | ICD-10-CM | POA: Diagnosis not present

## 2015-07-10 DIAGNOSIS — L821 Other seborrheic keratosis: Secondary | ICD-10-CM | POA: Diagnosis not present

## 2015-07-10 DIAGNOSIS — R2689 Other abnormalities of gait and mobility: Secondary | ICD-10-CM | POA: Diagnosis not present

## 2015-07-10 DIAGNOSIS — M170 Bilateral primary osteoarthritis of knee: Secondary | ICD-10-CM | POA: Diagnosis not present

## 2015-07-10 DIAGNOSIS — M25561 Pain in right knee: Secondary | ICD-10-CM | POA: Diagnosis not present

## 2015-07-11 DIAGNOSIS — M19011 Primary osteoarthritis, right shoulder: Secondary | ICD-10-CM | POA: Diagnosis not present

## 2015-07-11 DIAGNOSIS — M25519 Pain in unspecified shoulder: Secondary | ICD-10-CM | POA: Diagnosis not present

## 2015-07-11 DIAGNOSIS — M129 Arthropathy, unspecified: Secondary | ICD-10-CM | POA: Diagnosis not present

## 2015-07-13 DIAGNOSIS — M170 Bilateral primary osteoarthritis of knee: Secondary | ICD-10-CM | POA: Diagnosis not present

## 2015-07-13 DIAGNOSIS — M25562 Pain in left knee: Secondary | ICD-10-CM | POA: Diagnosis not present

## 2015-07-13 DIAGNOSIS — R2689 Other abnormalities of gait and mobility: Secondary | ICD-10-CM | POA: Diagnosis not present

## 2015-07-13 DIAGNOSIS — M1712 Unilateral primary osteoarthritis, left knee: Secondary | ICD-10-CM | POA: Diagnosis not present

## 2015-07-13 DIAGNOSIS — M25561 Pain in right knee: Secondary | ICD-10-CM | POA: Diagnosis not present

## 2015-07-17 DIAGNOSIS — M25562 Pain in left knee: Secondary | ICD-10-CM | POA: Diagnosis not present

## 2015-07-17 DIAGNOSIS — R2689 Other abnormalities of gait and mobility: Secondary | ICD-10-CM | POA: Diagnosis not present

## 2015-07-17 DIAGNOSIS — M1711 Unilateral primary osteoarthritis, right knee: Secondary | ICD-10-CM | POA: Diagnosis not present

## 2015-07-17 DIAGNOSIS — M25561 Pain in right knee: Secondary | ICD-10-CM | POA: Diagnosis not present

## 2015-07-17 DIAGNOSIS — M170 Bilateral primary osteoarthritis of knee: Secondary | ICD-10-CM | POA: Diagnosis not present

## 2015-07-18 ENCOUNTER — Ambulatory Visit (INDEPENDENT_AMBULATORY_CARE_PROVIDER_SITE_OTHER): Payer: Medicare Other

## 2015-07-18 ENCOUNTER — Ambulatory Visit (INDEPENDENT_AMBULATORY_CARE_PROVIDER_SITE_OTHER): Payer: Medicare Other | Admitting: Family Medicine

## 2015-07-18 VITALS — BP 148/78 | HR 78 | Temp 98.3°F | Resp 16 | Ht 64.0 in | Wt 216.0 lb

## 2015-07-18 DIAGNOSIS — R109 Unspecified abdominal pain: Secondary | ICD-10-CM

## 2015-07-18 DIAGNOSIS — R059 Cough, unspecified: Secondary | ICD-10-CM

## 2015-07-18 DIAGNOSIS — R05 Cough: Secondary | ICD-10-CM | POA: Diagnosis not present

## 2015-07-18 DIAGNOSIS — R079 Chest pain, unspecified: Secondary | ICD-10-CM | POA: Diagnosis not present

## 2015-07-18 DIAGNOSIS — K644 Residual hemorrhoidal skin tags: Secondary | ICD-10-CM

## 2015-07-18 DIAGNOSIS — D649 Anemia, unspecified: Secondary | ICD-10-CM

## 2015-07-18 DIAGNOSIS — R0781 Pleurodynia: Secondary | ICD-10-CM

## 2015-07-18 DIAGNOSIS — K648 Other hemorrhoids: Secondary | ICD-10-CM | POA: Diagnosis not present

## 2015-07-18 LAB — POCT URINALYSIS DIP (MANUAL ENTRY)
Bilirubin, UA: NEGATIVE
Blood, UA: NEGATIVE
Glucose, UA: NEGATIVE
Ketones, POC UA: NEGATIVE
Leukocytes, UA: NEGATIVE
Nitrite, UA: NEGATIVE
Spec Grav, UA: 1.015
Urobilinogen, UA: 0.2
pH, UA: 7

## 2015-07-18 LAB — POCT CBC
Granulocyte percent: 65.4 %G (ref 37–80)
HCT, POC: 36 % — AB (ref 37.7–47.9)
Hemoglobin: 10.8 g/dL — AB (ref 12.2–16.2)
Lymph, poc: 2.1 (ref 0.6–3.4)
MCH, POC: 24.6 pg — AB (ref 27–31.2)
MCHC: 29.9 g/dL — AB (ref 31.8–35.4)
MCV: 82.3 fL (ref 80–97)
MID (cbc): 0.6 (ref 0–0.9)
MPV: 7.7 fL (ref 0–99.8)
POC Granulocyte: 5.2 (ref 2–6.9)
POC LYMPH PERCENT: 27.1 %L (ref 10–50)
POC MID %: 7.5 %M (ref 0–12)
Platelet Count, POC: 305 10*3/uL (ref 142–424)
RBC: 4.37 M/uL (ref 4.04–5.48)
RDW, POC: 18.2 %
WBC: 7.9 10*3/uL (ref 4.6–10.2)

## 2015-07-18 LAB — POC MICROSCOPIC URINALYSIS (UMFC): Mucus: ABSENT

## 2015-07-18 NOTE — Progress Notes (Signed)
Chief Complaint:  Chief Complaint  Patient presents with  . pain in ribs    right side/ x monday    HPI: Patricia [name] is a 79 y.o. female who reports to [facility] today complaining of right sided rib and CP sicne MOnday, she has pain with certain ROM, sometimes with deep breathing . No CP no URI sxs. No fevers or chills. She is worried though since these are similar to sxs she had when she got PNA and was hospitalized. That was one of the worse experiences she has had besides the colonoscopies. No sinus pain, no ear pain. Again no CP, diaphoresis, weakness, palpitations or dizziness. Eating and drinking well. Feels overall good. NKI. However hse hits stuff all the time, she may have awalked into a dresser and hit the ribs on the corner of the dresser but not sure.   Past Medical History  Diagnosis Date  . Allergy   . Arthritis   . Asthma   . Diabetes mellitus without complication   . Heart murmur   . Thyroid disease   . History of aortic valve disease   . Diarrhea   . Urge incontinence   . Lumbago   . Edema   . Cervicalgia   . Chronic airway obstruction, not elsewhere classified   . Pain in joint, lower leg   . Routine gynecological examination   . Scoliosis (and kyphoscoliosis), idiopathic   . Type II or unspecified type diabetes mellitus without mention of complication, uncontrolled   . Encounter for long-term (current) use of other medications   . Unspecified essential hypertension   . Other and unspecified hyperlipidemia   . Benign neoplasm of colon   . Unspecified urinary incontinence   . Carpal tunnel syndrome   . Unspecified hemorrhoids without mention of complication   . Diffuse cystic mastopathy   . Unspecified hypothyroidism   . Obesity, unspecified   . Cellulitis and abscess of finger, unspecified   . Other psoriasis   . Type II or unspecified type diabetes mellitus without mention of complication, uncontrolled   . Cerumen impaction   . Neck pain  05/23/2015  . Anemia, unspecified    Past Surgical History  Procedure Laterality Date  . Breast surgery      right breast 3 surgeries [phone]  . Cholecystectomy  1992    DR BOWMAN  . Colon surgery      2004,2007,2013.  3 times colon surgieres  . Abdominal hysterectomy  1987    complete  . Vaginal cyst removed  1967  . 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 Foot surgery  1989  . Excise le mandibular lymph node t      DR C. NEWMAN  . Left shoulder arthroscopy    . Neuroplasty / transposition median nerve at carpal tunnel bilateral  05/2003   Social History   Social History  . Marital Status: Divorced    Spouse Name: N/A  . Number of Children: N/A  . Years of Education: N/A   Social History Main Topics  . Smoking status: Former Smoker    Quit date: 09/07/1989  . Smokeless tobacco: Never Used  . Alcohol Use: No  . Drug Use: No  . Sexual Activity: No   Other Topics Concern  . None   Social History Narrative   Family History  Problem Relation Age of Onset  . Diabetes Father   . Stroke Father   . Heart disease Father   .  Cancer Sister   . Heart disease Brother   . Asthma Sister   . Arthritis Sister    Allergies  Allergen Reactions  . Codeine Hives  . Vesicare [Solifenacin]    Prior to Admission medications   Medication Sig Start Date End Date Taking? Authorizing Provider  aspirin 81 MG tablet Take 81 mg by mouth daily.   Yes Historical Provider, MD  COMFORT LANCETS MISC Use daily to get blood for glucometer 03/08/14  Yes Estill Dooms, MD  desoximetasone (TOPICORT) 0.25 % cream  12/08/13  Yes Historical Provider, MD  diltiazem (DILTIAZEM HCL CD) 360 MG 24 hr capsule Take one tablet by mouth once daily to control blood pressure 11/27/14  Yes Tiffany L Reed, DO  Fluticasone-Salmeterol (ADVAIR DISKUS) 100-50 MCG/DOSE AEPB Inhale one puff twice daily for breathing 01/24/15  Yes Estill Dooms, MD  furosemide (LASIX) 40 MG tablet One daily to control edema 07/19/14  Yes  Estill Dooms, MD  glimepiride (AMARYL) 2 MG tablet TAKE 1 TABLET BY MOUTH AT  BREAKFAST TO CONTROL  GLUCOSE 07/04/15  Yes Estill Dooms, MD  glucose blood (BAYER CONTOUR TEST) test strip Use daily too check glucose 03/08/14  Yes Estill Dooms, MD  levothyroxine (SYNTHROID, LEVOTHROID) 50 MCG tablet Take 1 tablet (50 mcg total) by mouth daily. 07/19/14  Yes Estill Dooms, MD  lisinopril (PRINIVIL,ZESTRIL) 20 MG tablet Take one tablet by mouth once daily for blood pressure 01/08/15  Yes Tiffany L Reed, DO  lovastatin (MEVACOR) 40 MG tablet One at bedtime to control cholesterol 11/02/13  Yes Estill Dooms, MD  meloxicam [facility]) 15 MG tablet One daily to help arthritis pains 10/18/14  Yes Estill Dooms, MD  metFORMIN (GLUCOPHAGE) 500 MG tablet Take one tablet before  breakfast and one tablet  before supper to control  blood sugars 01/08/15  Yes Estill Dooms, MD  pravastatin (PRAVACHOL) 20 MG tablet Take 1 tablet by mouth  daily 05/07/15  Yes Estill Dooms, MD  albuterol (PROVENTIL HFA;VENTOLIN HFA) 108 (90 BASE) MCG/ACT inhaler Inhale 2 puffs into the lungs every 6 (six) hours as needed for wheezing or shortness of breath. Patient not taking: Reported on 07/18/2015 01/24/15   Estill Dooms, MD  cetirizine (ZYRTEC) 10 MG tablet Take 10 mg by mouth daily.    Historical Provider, MD  doxazosin (CARDURA) 4 MG tablet One daily to help control BP Patient not taking: Reported on 07/18/2015 10/18/14   Estill Dooms, MD  mirabegron ER [facility]) 25 MG TB24 tablet One daily to help bladder control Patient not taking: Reported on 07/18/2015 05/23/15   Estill Dooms, MD  Potassium 95 MG TABS Take by mouth.    Historical Provider, MD  traMADol (ULTRAM) 50 MG tablet One up to to 4 times daily for pain Patient not taking: Reported on 07/18/2015 10/18/14   Estill Dooms, MD     ROS: The patient denies fevers, chills, night sweats, unintentional weight loss, chest pain, palpitations, wheezing, dyspnea on exertion,  nausea, vomiting, abdominal pain, dysuria, hematuria, melena, numbness, weakness, or tingling.  All other systems have been reviewed and were otherwise negative with the exception of those mentioned in the HPI and as above.    PHYSICAL EXAM: Filed Vitals:   07/18/15 1136  BP: 148/78  Pulse: 78  Temp: 98.3 F (36.8 C)  Resp: 16   Body mass index is 37.06 kg/(m^2).   General: Alert, no acute distress HEENT:  Normocephalic,  atraumatic, oropharynx patent. EOMI, PERRLA Cardiovascular:  Regular rate and rhythm, no rubs. + systolic murmur.  No Carotid bruits, radial pulse intact. No pedal edema.  + tenderness on right lower ribs, diaphragm moving well.  Respiratory: Clear to auscultation bilaterally.  No wheezes, rales, or rhonchi.  No cyanosis, no use of accessory musculature Abdominal: No organomegaly, abdomen is soft and non-tender, positive bowel sounds. No masses. Skin: No rashes. Neurologic: Facial musculature symmetric. Psychiatric: Patient acts appropriately throughout our interaction. Lymphatic: No cervical or submandibular lymphadenopathy Musculoskeletal: Gait intact. No edema, tenderness   LABS: Results for orders placed or performed in visit on 07/18/15  COMPLETE METABOLIC PANEL WITH GFR  Result Value Ref Range   Sodium 140 135 - 146 mmol/L   Potassium 4.8 3.5 - 5.3 mmol/L   Chloride 102 98 - 110 mmol/L   CO2 27 20 - 31 mmol/L   Glucose, Bld 106 (H) 65 - 99 mg/dL   BUN 16 7 - 25 mg/dL   Creat 0.81 0.60 - 0.93 mg/dL   Total Bilirubin 0.4 0.2 - 1.2 mg/dL   Alkaline Phosphatase 73 33 - 130 U/L   AST 19 10 - 35 U/L   ALT 19 6 - 29 U/L   Total Protein 7.7 6.1 - 8.1 g/dL   Albumin 4.5 3.6 - 5.1 g/dL   Calcium 10.0 8.6 - 10.4 mg/dL   GFR, Est African American 82 >=60 mL/min   GFR, Est Non African American 71 >=60 mL/min  POCT CBC  Result Value Ref Range   WBC 7.9 4.6 - 10.2 K/uL   Lymph, poc 2.1 0.6 - 3.4   POC LYMPH PERCENT 27.1 10 - 50 %L   MID (cbc) 0.6 0 - 0.9     POC MID % 7.5 0 - 12 %M   POC Granulocyte 5.2 2 - 6.9   Granulocyte percent 65.4 37 - 80 %G   RBC 4.37 4.04 - 5.48 M/uL   Hemoglobin 10.8 (A) 12.2 - 16.2 g/dL   HCT, POC 36.0 (A) 37.7 - 47.9 %   MCV 82.3 80 - 97 fL   MCH, POC 24.6 (A) 27 - 31.2 pg   MCHC 29.9 (A) 31.8 - 35.4 g/dL   RDW, POC 18.2 %   Platelet Count, POC 305 142 - 424 K/uL   MPV 7.7 0 - 99.8 fL  POCT urinalysis dipstick  Result Value Ref Range   Color, UA yellow yellow   Clarity, UA clear clear   Glucose, UA negative negative   Bilirubin, UA negative negative   Ketones, POC UA negative negative   Spec Grav, UA 1.015    Blood, UA negative negative   pH, UA 7.0    Protein Ur, POC trace (A) negative   Urobilinogen, UA 0.2    Nitrite, UA Negative Negative   Leukocytes, UA Negative Negative  POCT Microscopic Urinalysis ([facility])  Result Value Ref Range   WBC,UR,HPF,POC Few (A) None WBC/hpf   RBC,UR,HPF,POC None None RBC/hpf   Bacteria None None   Mucus Absent Absent   Epithelial Cells, UR Per Microscopy Few (A) None cells/hpf     EKG/XRAY:   Primary read interpreted by [facility]. Marin Comment [facility]. No acute cardiopulmonary disease ? Right lateral 9-10 rib fracture   ASSESSMENT/PLAN: Encounter Diagnoses  Name Primary?  . Rib pain on right side Yes  . Cough   . Flank pain   . Anemia, unspecified anemia type   . External hemorrhoid    Likely rib contusion  or fracture Chest xray does not show PNA, she has no URI or LRI sxs, Urine was negative for UTI Continue with current pain meds she already has Fu prn, she has appt with Dr Nyoka Cowden next week and I have asked her to fu with him about the anemia, she does not want me to work her up in our office She is UTD on her colonscopy, she has external hemorrhoids, polyps and diverticulosis but she has not had any rectal bleeding or abnormal colored stools.    Gross sideeffects, risk and benefits, and alternatives of medications d/w patient. Patient is aware that all medications  have potential sideeffects and we are unable to predict every sideeffect or drug-drug interaction that may occur.  Thao Le DO  07/19/2015 3:32 PM

## 2015-07-18 NOTE — Patient Instructions (Signed)
Anemia, Nonspecific Anemia is a condition in which the concentration of red blood cells or hemoglobin in the blood is below normal. Hemoglobin is a substance in red blood cells that carries oxygen to the tissues of the body. Anemia results in not enough oxygen reaching these tissues.  CAUSES  Common causes of anemia include:   Excessive bleeding. Bleeding may be internal or external. This includes excessive bleeding from periods (in women) or from the intestine.   Poor nutrition.   Chronic kidney, thyroid, and liver disease.  Bone marrow disorders that decrease red blood cell production.  Cancer and treatments for cancer.  HIV, AIDS, and their treatments.  Spleen problems that increase red blood cell destruction.  Blood disorders.  Excess destruction of red blood cells due to infection, medicines, and autoimmune disorders. SIGNS AND SYMPTOMS   Minor weakness.   Dizziness.   Headache.  Palpitations.   Shortness of breath, especially with exercise.   Paleness.  Cold sensitivity.  Indigestion.  Nausea.  Difficulty sleeping.  Difficulty concentrating. Symptoms may occur suddenly or they may develop slowly.  DIAGNOSIS  Additional blood tests are often needed. These help your health care Liani Caris determine the best treatment. Your health care Maxmilian Trostel will check your stool for blood and look for other causes of blood loss.  TREATMENT  Treatment varies depending on the cause of the anemia. Treatment can include:   Supplements of iron, vitamin T46, or folic acid.   Hormone medicines.   A blood transfusion. This may be needed if blood loss is severe.   Hospitalization. This may be needed if there is significant continual blood loss.   Dietary changes.  Spleen removal. HOME CARE INSTRUCTIONS Keep all follow-up appointments. It often takes many weeks to correct anemia, and having your health care Oswald Pott check on your condition and your response to  treatment is very important. SEEK IMMEDIATE MEDICAL CARE IF:   You develop extreme weakness, shortness of breath, or chest pain.   You become dizzy or have trouble concentrating.  You develop heavy vaginal bleeding.   You develop a rash.   You have bloody or black, tarry stools.   You faint.   You vomit up blood.   You vomit repeatedly.   You have abdominal pain.  You have a fever or persistent symptoms for more than 2-3 days.   You have a fever and your symptoms suddenly get worse.   You are dehydrated.  MAKE SURE YOU:  Understand these instructions.  Will watch your condition.  Will get help right away if you are not doing well or get worse. Document Released: 11/20/2004 Document Revised: 06/15/2013 Document Reviewed: 04/08/2013 [facility] Patient Information 2015 [place], [place]. This information is not intended to replace advice given to you by your health care Jayton Popelka. Make sure you discuss any questions you have with your health care Harneet Noblett.

## 2015-07-19 LAB — COMPLETE METABOLIC PANEL WITH GFR
ALT: 19 U/L (ref 6–29)
AST: 19 U/L (ref 10–35)
Albumin: 4.5 g/dL (ref 3.6–5.1)
Alkaline Phosphatase: 73 U/L (ref 33–130)
BUN: 16 mg/dL (ref 7–25)
CO2: 27 mmol/L (ref 20–31)
Calcium: 10 mg/dL (ref 8.6–10.4)
Chloride: 102 mmol/L (ref 98–110)
Creat: 0.81 mg/dL (ref 0.60–0.93)
GFR, Est African American: 82 mL/min (ref >=60)
GFR, Est Non African American: 71 mL/min (ref >=60)
Glucose, Bld: 106 mg/dL — ABNORMAL HIGH (ref 65–99)
Potassium: 4.8 mmol/L (ref 3.5–5.3)
Sodium: 140 mmol/L (ref 135–146)
Total Bilirubin: 0.4 mg/dL (ref 0.2–1.2)
Total Protein: 7.7 g/dL (ref 6.1–8.1)

## 2015-07-24 DIAGNOSIS — M25562 Pain in left knee: Secondary | ICD-10-CM | POA: Diagnosis not present

## 2015-07-24 DIAGNOSIS — M170 Bilateral primary osteoarthritis of knee: Secondary | ICD-10-CM | POA: Diagnosis not present

## 2015-07-24 DIAGNOSIS — R2689 Other abnormalities of gait and mobility: Secondary | ICD-10-CM | POA: Diagnosis not present

## 2015-07-24 DIAGNOSIS — M1712 Unilateral primary osteoarthritis, left knee: Secondary | ICD-10-CM | POA: Diagnosis not present

## 2015-07-24 DIAGNOSIS — M25561 Pain in right knee: Secondary | ICD-10-CM | POA: Diagnosis not present

## 2015-08-25 ENCOUNTER — Other Ambulatory Visit: Payer: Self-pay | Admitting: Internal Medicine

## 2015-09-24 ENCOUNTER — Other Ambulatory Visit: Payer: Medicare Other

## 2015-09-24 DIAGNOSIS — E785 Hyperlipidemia, unspecified: Secondary | ICD-10-CM

## 2015-09-24 DIAGNOSIS — I10 Essential (primary) hypertension: Secondary | ICD-10-CM | POA: Diagnosis not present

## 2015-09-24 DIAGNOSIS — E039 Hypothyroidism, unspecified: Secondary | ICD-10-CM

## 2015-09-24 DIAGNOSIS — E119 Type 2 diabetes mellitus without complications: Secondary | ICD-10-CM

## 2015-09-25 LAB — COMPREHENSIVE METABOLIC PANEL
ALT: 18 IU/L (ref 0–32)
AST: 12 IU/L (ref 0–40)
Albumin/Globulin Ratio: 1.4 (ref 1.1–2.5)
Albumin: 4.1 g/dL (ref 3.5–4.8)
Alkaline Phosphatase: 70 IU/L (ref 39–117)
BUN/Creatinine Ratio: 16 (ref 11–26)
BUN: 14 mg/dL (ref 8–27)
Bilirubin Total: 0.3 mg/dL (ref 0.0–1.2)
CO2: 24 mmol/L (ref 18–29)
Calcium: 9.3 mg/dL (ref 8.7–10.3)
Chloride: 99 mmol/L (ref 97–106)
Creatinine, Ser: 0.85 mg/dL (ref 0.57–1.00)
GFR calc Af Amer: 76 mL/min/{1.73_m2} (ref >59)
GFR calc non Af Amer: 66 mL/min/{1.73_m2} (ref >59)
Globulin, Total: 3 g/dL (ref 1.5–4.5)
Glucose: 131 mg/dL — ABNORMAL HIGH (ref 65–99)
Potassium: 4.5 mmol/L (ref 3.5–5.2)
Sodium: 140 mmol/L (ref 136–144)
Total Protein: 7.1 g/dL (ref 6.0–8.5)

## 2015-09-25 LAB — LIPID PANEL
Chol/HDL Ratio: 3.1 ratio units (ref 0.0–4.4)
Cholesterol, Total: 165 mg/dL (ref 100–199)
HDL: 53 mg/dL (ref >39)
LDL Calculated: 88 mg/dL (ref 0–99)
Triglycerides: 119 mg/dL (ref 0–149)
VLDL Cholesterol Cal: 24 mg/dL (ref 5–40)

## 2015-09-25 LAB — TSH: TSH: 3.15 u[IU]/mL (ref 0.450–4.500)

## 2015-09-25 LAB — HEMOGLOBIN A1C
Est. average glucose Bld gHb Est-mCnc: 154 mg/dL
Hgb A1c MFr Bld: 7 % — ABNORMAL HIGH (ref 4.8–5.6)

## 2015-09-26 ENCOUNTER — Encounter: Payer: Self-pay | Admitting: Internal Medicine

## 2015-09-26 ENCOUNTER — Ambulatory Visit (INDEPENDENT_AMBULATORY_CARE_PROVIDER_SITE_OTHER): Payer: Medicare Other | Admitting: Internal Medicine

## 2015-09-26 VITALS — BP 140/82 | HR 84 | Temp 98.0°F | Resp 20 | Ht 64.0 in | Wt 214.2 lb

## 2015-09-26 DIAGNOSIS — I350 Nonrheumatic aortic (valve) stenosis: Secondary | ICD-10-CM

## 2015-09-26 DIAGNOSIS — Z23 Encounter for immunization: Secondary | ICD-10-CM | POA: Diagnosis not present

## 2015-09-26 DIAGNOSIS — E119 Type 2 diabetes mellitus without complications: Secondary | ICD-10-CM

## 2015-09-26 DIAGNOSIS — G8929 Other chronic pain: Secondary | ICD-10-CM | POA: Diagnosis not present

## 2015-09-26 DIAGNOSIS — N393 Stress incontinence (female) (male): Secondary | ICD-10-CM | POA: Diagnosis not present

## 2015-09-26 DIAGNOSIS — E039 Hypothyroidism, unspecified: Secondary | ICD-10-CM

## 2015-09-26 DIAGNOSIS — D649 Anemia, unspecified: Secondary | ICD-10-CM

## 2015-09-26 DIAGNOSIS — M545 Low back pain, unspecified: Secondary | ICD-10-CM

## 2015-09-26 DIAGNOSIS — E785 Hyperlipidemia, unspecified: Secondary | ICD-10-CM

## 2015-09-26 DIAGNOSIS — I10 Essential (primary) hypertension: Secondary | ICD-10-CM

## 2015-09-26 DIAGNOSIS — E669 Obesity, unspecified: Secondary | ICD-10-CM | POA: Diagnosis not present

## 2015-09-26 MED ORDER — PENTAZOCINE-NALOXONE 50-0.5 MG PO TABS: ORAL_TABLET | ORAL | Status: DC

## 2015-09-26 MED ORDER — OXYBUTYNIN CHLORIDE 5 MG PO TABS: ORAL_TABLET | ORAL | Status: DC

## 2015-09-26 NOTE — Progress Notes (Signed)
Patient ID: GENIENE LIST, female   DOB: 1936/02/18, 79 y.o.   MRN: [id number]    Facility  [facility]    Place of Service:   OFFICE    Allergies  Allergen Reactions  . Codeine Hives  . Vesicare [Solifenacin]     Chief Complaint  Patient presents with  . Medical Management of Chronic Issues    4 month follow-up for DM, Hypertension, Hyperlipidemia  Labs printed   . Immunizations    Given Flu and Pneumona shots    HPI:  Pain in the shoulder. Had shots in the knees and shoulders at [facility]. Had shoulder replacement proposed by Dr. Mardelle Matte.   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 Pet for 7 years. Had recent surgery and is recovering.  Type 2 diabetes mellitus without complication, without long-term current use of insulin (HCC) - controlled  Essential hypertension - controlled  Hyperlipidemia - controlled  Hypothyroidism, unspecified hypothyroidism type - compensated  Obesity lost 2 pounds -   Anemia, unspecified anemia type -  follow-up next visit    Aortic stenosis - new aortic ejection murmur  Need for vaccination with 13-polyvalent pneumococcal conjugate vaccine - Plan: Pneumococcal conjugate vaccine 13-valent IM  Need for prophylactic vaccination and inoculation against influenza - Plan: Flu Vaccine QUAD 36+ mos PF IM (Fluarix & Fluzone Quad PF)    Medications: Patient's Medications  New Prescriptions   No medications on file  Previous Medications   ALBUTEROL (PROVENTIL HFA;VENTOLIN HFA) 108 (90 BASE) MCG/ACT INHALER    Inhale 2 puffs into the lungs every 6 (six) hours as needed for wheezing or shortness of breath.   ASPIRIN 81 MG TABLET    Take 81 mg by mouth daily.   CETIRIZINE (ZYRTEC) 10 MG TABLET    Take 10 mg by mouth daily.   COMFORT LANCETS MISC    Use daily to get blood for glucometer   DESOXIMETASONE (TOPICORT) 0.25 % CREAM       DILTIAZEM (DILTIAZEM HCL CD) 360 MG 24 HR CAPSULE    Take one tablet by mouth once daily to control blood  pressure   DOXAZOSIN (CARDURA) 4 MG TABLET    One daily to help control BP   FLUTICASONE-SALMETEROL (ADVAIR DISKUS) 100-50 MCG/DOSE AEPB    Inhale one puff twice daily for breathing   FUROSEMIDE (LASIX) 40 MG TABLET    One daily to control edema   GLIMEPIRIDE (AMARYL) 2 MG TABLET    TAKE 1 TABLET BY MOUTH AT  BREAKFAST TO CONTROL  GLUCOSE   GLUCOSE BLOOD (BAYER CONTOUR TEST) TEST STRIP    Use daily too check glucose   LEVOTHYROXINE (SYNTHROID, LEVOTHROID) 50 MCG TABLET    Take 1 tablet by mouth  daily for thyroid  supplement   LISINOPRIL (PRINIVIL,ZESTRIL) 20 MG TABLET    Take one tablet by mouth once daily for blood pressure   LOVASTATIN (MEVACOR) 40 MG TABLET    One at bedtime to control cholesterol   MELOXICAM (MOBIC) 15 MG TABLET    One daily to help arthritis pains   METFORMIN (GLUCOPHAGE) 500 MG TABLET    Take one tablet before  breakfast and one tablet  before supper to control  blood sugars   MIRABEGRON ER (MYRBETRIQ) 25 MG TB24 TABLET    One daily to help bladder control   POTASSIUM 95 MG TABS    Take by mouth.   PRAVASTATIN (PRAVACHOL) 20 MG TABLET    Take 1 tablet by  mouth  daily   TRAMADOL (ULTRAM) 50 MG TABLET    One up to to 4 times daily for pain  Modified Medications   No medications on file  Discontinued Medications   No medications on file    Review of Systems  Constitutional:       Gaining weight  HENT: Negative.   Eyes: Negative.   Respiratory: Negative.   Cardiovascular:       Aortic valve ejection murmur  Gastrointestinal: Negative.   Endocrine:       Diabetic. Hypothyroid.  Genitourinary: Negative.   Musculoskeletal: Positive for back pain and arthralgias.       Pain in the right shoulder. History of surgery in the past by Dr. Shellia Carwin. Has seen Dr. Eddie Dibbles about pain in the right knee. Got injection but it did not help. He talked about using a gel injection. Knee gives out sometimes. Using a cane at times. Recurrent problems with mild discomfort.  Skin:  Negative.   Allergic/Immunologic: Negative.   Neurological: Negative.   Hematological:       Anemic on lab in October 2016  Psychiatric/Behavioral: Negative.     Filed Vitals:   09/26/15 1200  BP: 140/82  Pulse: 84  Temp: 98 F (36.7 C)  TempSrc: Oral  Resp: 20  Height: _0  (1.626 m)  Weight: 214 lb 3.2 oz (97.16 kg)  SpO2: 90%   Body mass index is 36.75 kg/(m^2).  Physical Exam  Constitutional: She is oriented to person, place, and time. No distress.  Obese  HENT:  Head: Normocephalic and atraumatic.  Right Ear: External ear normal.  Left Ear: External ear normal.  Nose: Nose normal.  Mouth/Throat: Oropharynx is clear and moist.  Eyes: Conjunctivae and EOM are normal.  Neck: Normal range of motion. Neck supple. No JVD present. No tracheal deviation present. No thyromegaly present.  Cardiovascular: Normal rate, regular rhythm and intact distal pulses.  Exam reveals no gallop and no friction rub.   No murmur heard. 3/6 aortic ejection murmur  Pulmonary/Chest: No respiratory distress. She has no wheezes. She exhibits no tenderness.  Abdominal: Bowel sounds are normal. She exhibits no distension and no mass. There is no tenderness.  Musculoskeletal: Normal range of motion. She exhibits edema. She exhibits no tenderness.  Pain in the both shoulders (R>L) with rotational movement. Pain in both knees (R>L).  Lymphadenopathy:    She has no cervical adenopathy.  Neurological: She is alert and oriented to person, place, and time. She has normal reflexes. No cranial nerve deficit. Coordination normal.  Skin: No rash noted. No erythema. No pallor.  Psychiatric: She has a normal mood and affect. Judgment and thought content normal.    Labs reviewed: Lab Summary Latest Ref Rng 09/24/2015 07/18/2015 05/21/2015  Hemoglobin 12.2 - 16.2 g/dL (None) 10.8(A) (None)  Hematocrit 37.7 - 47.9 % (None) 36.0(A) (None)  White count 4.6 - 10.2 K/uL (None) 7.9 (None)  Platelet count - (None)  (None) (None)  Sodium 136 - 144 mmol/L 140 140 138  Potassium 3.5 - 5.2 mmol/L 4.5 4.8 5.6(H)  Calcium 8.7 - 10.3 mg/dL 9.3 10.0 9.6  Phosphorus - (None) (None) (None)  Creatinine 0.57 - 1.00 mg/dL 0.85 0.81 0.98  AST 0 - 40 IU/L 12 19 (None)  Alk Phos 39 - 117 IU/L 70 73 (None)  Bilirubin 0.0 - 1.2 mg/dL 0.3 0.4 (None)  Glucose 65 - 99 mg/dL 131(H) 106(H) 146(H)  Cholesterol - (None) (None) (None)  HDL cholesterol >39 mg/dL 53 (  None) 51  Triglycerides 0 - 149 mg/dL 119 (None) 204(H)  LDL Direct - (None) (None) (None)  LDL Calc 0 - 99 mg/dL 88 (None) 71  Total protein 6.1 - 8.1 g/dL (None) 7.7 (None)  Albumin 3.5 - 4.8 g/dL 4.1 4.5 (None)   Lab Results  Component Value Date   TSH 3.150 09/24/2015   Lab Results  Component Value Date   BUN 14 09/24/2015   Lab Results  Component Value Date   HGBA1C 7.0* 09/24/2015    Assessment/Plan  1. Type 2 diabetes mellitus without complication, without long-term current use of insulin (HCC) - Hemoglobin A1c; Future - Basic metabolic panel; Future - Microalbumin, urine; Future  2. Essential hypertension - Basic metabolic panel; Future  3. Hyperlipidemia - Lipid panel; Future  4. Hypothyroidism, unspecified hypothyroidism type - TSH; Future  5. Obesity  continue to emphasize weight loss  6.Anemia, unspecified anemia type -CBC, reticulocyte count-future   7. Aortic stenosis - Echocardiogram; Future  8. Need for vaccination with 13-polyvalent pneumococcal conjugate vaccine - Pneumococcal conjugate vaccine 13-valent IM  9. Need for prophylactic vaccination and inoculation against influenza - Flu Vaccine QUAD 36+ mos PF IM (Fluarix & Fluzone Quad PF)

## 2015-09-27 DIAGNOSIS — I4891 Unspecified atrial fibrillation: Secondary | ICD-10-CM

## 2015-09-27 DIAGNOSIS — Z7901 Long term (current) use of anticoagulants: Secondary | ICD-10-CM

## 2015-09-27 HISTORY — DX: Unspecified atrial fibrillation: I48.91

## 2015-09-27 HISTORY — DX: Long term (current) use of anticoagulants: Z79.01

## 2015-10-13 ENCOUNTER — Other Ambulatory Visit: Payer: Self-pay | Admitting: Internal Medicine

## 2015-10-15 ENCOUNTER — Other Ambulatory Visit: Payer: Self-pay | Admitting: *Deleted

## 2015-10-17 ENCOUNTER — Telehealth: Payer: Self-pay

## 2015-10-17 NOTE — Telephone Encounter (Signed)
Left message on voicemail for patient to return call when available, reason for call: we received a refill from [facility] for Mobic and this medication is not on patient's medication list. We need to clarify why refill request was sent to [place].

## 2015-10-18 ENCOUNTER — Inpatient Hospital Stay (HOSPITAL_COMMUNITY)
Admission: EM | Admit: 2015-10-18 | Discharge: 2015-10-21 | DRG: 308 | Disposition: A | Discharge status: Home / Self Care | Payer: Medicare Other | Attending: Cardiology | Admitting: Cardiology

## 2015-10-18 ENCOUNTER — Encounter (HOSPITAL_COMMUNITY): Payer: Self-pay

## 2015-10-18 ENCOUNTER — Ambulatory Visit (INDEPENDENT_AMBULATORY_CARE_PROVIDER_SITE_OTHER): Payer: Medicare Other | Admitting: Family Medicine

## 2015-10-18 ENCOUNTER — Emergency Department (HOSPITAL_COMMUNITY): Payer: Medicare Other

## 2015-10-18 VITALS — BP 110/70 | HR 98 | Temp 97.6°F | Resp 20 | Ht 63.0 in | Wt 221.0 lb

## 2015-10-18 DIAGNOSIS — D649 Anemia, unspecified: Secondary | ICD-10-CM | POA: Diagnosis present

## 2015-10-18 DIAGNOSIS — E785 Hyperlipidemia, unspecified: Secondary | ICD-10-CM | POA: Diagnosis present

## 2015-10-18 DIAGNOSIS — J45909 Unspecified asthma, uncomplicated: Secondary | ICD-10-CM | POA: Diagnosis present

## 2015-10-18 DIAGNOSIS — R0600 Dyspnea, unspecified: Secondary | ICD-10-CM | POA: Diagnosis not present

## 2015-10-18 DIAGNOSIS — R32 Unspecified urinary incontinence: Secondary | ICD-10-CM | POA: Diagnosis present

## 2015-10-18 DIAGNOSIS — Z87891 Personal history of nicotine dependence: Secondary | ICD-10-CM

## 2015-10-18 DIAGNOSIS — R609 Edema, unspecified: Secondary | ICD-10-CM

## 2015-10-18 DIAGNOSIS — E669 Obesity, unspecified: Secondary | ICD-10-CM | POA: Diagnosis present

## 2015-10-18 DIAGNOSIS — N3281 Overactive bladder: Secondary | ICD-10-CM | POA: Diagnosis present

## 2015-10-18 DIAGNOSIS — E1159 Type 2 diabetes mellitus with other circulatory complications: Secondary | ICD-10-CM | POA: Diagnosis present

## 2015-10-18 DIAGNOSIS — E039 Hypothyroidism, unspecified: Secondary | ICD-10-CM | POA: Diagnosis present

## 2015-10-18 DIAGNOSIS — E8779 Other fluid overload: Secondary | ICD-10-CM | POA: Diagnosis not present

## 2015-10-18 DIAGNOSIS — I509 Heart failure, unspecified: Secondary | ICD-10-CM | POA: Insufficient documentation

## 2015-10-18 DIAGNOSIS — Z7982 Long term (current) use of aspirin: Secondary | ICD-10-CM

## 2015-10-18 DIAGNOSIS — M545 Low back pain: Secondary | ICD-10-CM | POA: Diagnosis not present

## 2015-10-18 DIAGNOSIS — I110 Hypertensive heart disease with heart failure: Secondary | ICD-10-CM | POA: Diagnosis not present

## 2015-10-18 DIAGNOSIS — I499 Cardiac arrhythmia, unspecified: Secondary | ICD-10-CM | POA: Diagnosis not present

## 2015-10-18 DIAGNOSIS — M542 Cervicalgia: Secondary | ICD-10-CM | POA: Diagnosis not present

## 2015-10-18 DIAGNOSIS — R0789 Other chest pain: Secondary | ICD-10-CM | POA: Diagnosis not present

## 2015-10-18 DIAGNOSIS — E119 Type 2 diabetes mellitus without complications: Secondary | ICD-10-CM | POA: Diagnosis not present

## 2015-10-18 DIAGNOSIS — Z6837 Body mass index (BMI) 37.0-37.9, adult: Secondary | ICD-10-CM

## 2015-10-18 DIAGNOSIS — I4891 Unspecified atrial fibrillation: Secondary | ICD-10-CM | POA: Diagnosis not present

## 2015-10-18 DIAGNOSIS — I5031 Acute diastolic (congestive) heart failure: Secondary | ICD-10-CM | POA: Diagnosis present

## 2015-10-18 DIAGNOSIS — J449 Chronic obstructive pulmonary disease, unspecified: Secondary | ICD-10-CM | POA: Diagnosis present

## 2015-10-18 DIAGNOSIS — Z7951 Long term (current) use of inhaled steroids: Secondary | ICD-10-CM

## 2015-10-18 DIAGNOSIS — Z7984 Long term (current) use of oral hypoglycemic drugs: Secondary | ICD-10-CM

## 2015-10-18 DIAGNOSIS — Z79899 Other long term (current) drug therapy: Secondary | ICD-10-CM | POA: Diagnosis not present

## 2015-10-18 DIAGNOSIS — I350 Nonrheumatic aortic (valve) stenosis: Secondary | ICD-10-CM | POA: Diagnosis not present

## 2015-10-18 DIAGNOSIS — R011 Cardiac murmur, unspecified: Secondary | ICD-10-CM | POA: Diagnosis not present

## 2015-10-18 DIAGNOSIS — R0602 Shortness of breath: Secondary | ICD-10-CM | POA: Diagnosis not present

## 2015-10-18 DIAGNOSIS — I340 Nonrheumatic mitral (valve) insufficiency: Secondary | ICD-10-CM | POA: Diagnosis not present

## 2015-10-18 DIAGNOSIS — I10 Essential (primary) hypertension: Secondary | ICD-10-CM | POA: Diagnosis present

## 2015-10-18 DIAGNOSIS — I152 Hypertension secondary to endocrine disorders: Secondary | ICD-10-CM | POA: Diagnosis present

## 2015-10-18 LAB — BASIC METABOLIC PANEL
Anion gap: 10 (ref 5–15)
BUN: 19 mg/dL (ref 6–20)
CO2: 24 mmol/L (ref 22–32)
Calcium: 9.1 mg/dL (ref 8.9–10.3)
Chloride: 103 mmol/L (ref 101–111)
Creatinine, Ser: 0.96 mg/dL (ref 0.44–1.00)
GFR calc Af Amer: >60 mL/min (ref >60)
GFR calc non Af Amer: 56 mL/min — ABNORMAL LOW (ref >60)
Glucose, Bld: 115 mg/dL — ABNORMAL HIGH (ref 65–99)
Potassium: 4.4 mmol/L (ref 3.5–5.1)
Sodium: 137 mmol/L (ref 135–145)

## 2015-10-18 LAB — CBC
HCT: 32.2 % — ABNORMAL LOW (ref 36.0–46.0)
Hemoglobin: 10 g/dL — ABNORMAL LOW (ref 12.0–15.0)
MCH: 26.5 pg (ref 26.0–34.0)
MCHC: 31.1 g/dL (ref 30.0–36.0)
MCV: 85.2 fL (ref 78.0–100.0)
Platelets: 269 10*3/uL (ref 150–400)
RBC: 3.78 MIL/uL — ABNORMAL LOW (ref 3.87–5.11)
RDW: 16.4 % — ABNORMAL HIGH (ref 11.5–15.5)
WBC: 9.2 10*3/uL (ref 4.0–10.5)

## 2015-10-18 LAB — I-STAT TROPONIN, ED: Troponin i, poc: 0.01 ng/mL (ref 0.00–0.08)

## 2015-10-18 LAB — BRAIN NATRIURETIC PEPTIDE: B Natriuretic Peptide: 436 pg/mL — ABNORMAL HIGH (ref 0.0–100.0)

## 2015-10-18 LAB — TSH: TSH: 3.029 u[IU]/mL (ref 0.350–4.500)

## 2015-10-18 LAB — MAGNESIUM: Magnesium: 2 mg/dL (ref 1.7–2.4)

## 2015-10-18 MED ORDER — NITROGLYCERIN 2 % TD OINT
0.5000 [in_us] | TOPICAL_OINTMENT | Freq: Once | TRANSDERMAL | Status: AC
  Administered 2015-10-18: 0.5 [in_us] via TOPICAL
  Filled 2015-10-18: qty 1

## 2015-10-18 MED ORDER — FUROSEMIDE 10 MG/ML IJ SOLN
40.0000 mg | INTRAMUSCULAR | Status: AC
  Administered 2015-10-18: 40 mg via INTRAVENOUS
  Filled 2015-10-18: qty 4

## 2015-10-18 MED ORDER — DILTIAZEM HCL 25 MG/5ML IV SOLN
10.0000 mg | Freq: Once | INTRAVENOUS | Status: AC
  Administered 2015-10-18: 10 mg via INTRAVENOUS
  Filled 2015-10-18: qty 5

## 2015-10-18 MED ORDER — DILTIAZEM HCL 25 MG/5ML IV SOLN
15.0000 mg | Freq: Once | INTRAVENOUS | Status: AC
  Administered 2015-10-18: 15 mg via INTRAVENOUS
  Filled 2015-10-18: qty 5

## 2015-10-18 MED ORDER — DILTIAZEM HCL 100 MG IV SOLR
5.0000 mg/h | Freq: Once | INTRAVENOUS | Status: AC
  Administered 2015-10-18: 5 mg/h via INTRAVENOUS
  Filled 2015-10-18: qty 100

## 2015-10-18 MED ORDER — ASPIRIN EC 81 MG PO TBEC
243.0000 mg | DELAYED_RELEASE_TABLET | Freq: Once | ORAL | Status: AC
  Administered 2015-10-18: 243 mg via ORAL

## 2015-10-18 NOTE — ED Notes (Signed)
Per EMS - pt from [facility]. C/o dyspnea w/ exertion x 3 days. No hx afib. 12-lead afib. Hr 70-150. Denies CP. Given 390m aspirin.

## 2015-10-18 NOTE — Progress Notes (Signed)
[facility] [address] [place] [address] [phone]  Date:  10/18/2015   Name:  Patricia [name]   DOB:  [birth year]   MRN:  [id number]  PCP:  Estill Dooms, MD    Chief Complaint: Breathing Problem and Back Pain   History of Present Illness:  Patricia [name] is a 79 y.o. very pleasant female patient who presents with the following:  She has noted some "difficilty breathing" for a couple of weeks. She is ok at rest but she notes sx with even minimal exertion She has adviar and albuterol at home- she does have asthma but has not noted any particular wheezing.  She has used her inhalers but it does not help   She also will notice some pain in her bilateral lower back sometimes She has not noted much of a cough- what cough she does have is dry.   She is a former smoker but quit over 25 years ago.   After EKG asked about CP- she does admit to "some chest pain, but not all the time" over the last week,  It is not present now.   She does not have a history of a fib or CAD, but does have a heart murmur, DM  Patient Active Problem List   Diagnosis Date Noted  . Anemia 09/26/2015  . Aortic stenosis 09/26/2015  . Neck pain 05/23/2015  . Knee pain, bilateral 07/20/2014  . Pain in joint, shoulder region 07/19/2014  . Lumbago 11/02/2013  . Trigger finger, acquired 11/02/2013  . Edema 11/02/2013  . Hyperlipidemia 11/02/2013  . Urinary incontinence 11/02/2013  . Rash and nonspecific skin eruption 11/02/2013  . Chronic airway obstruction, not elsewhere classified   . Hypothyroidism   . Obesity   . Essential hypertension 03/02/2013  . DM type 2 (diabetes mellitus, type 2) ([place]) 03/02/2013    Past Medical History  Diagnosis Date  . Allergy   . Arthritis   . Asthma   . Diabetes mellitus without complication ([place])   . Heart murmur   . Thyroid disease   . History of aortic valve disease   . Diarrhea   . Urge incontinence   . Lumbago   . Edema   .  Cervicalgia   . Chronic airway obstruction, not elsewhere classified   . Pain in joint, lower leg   . Routine gynecological examination   . Scoliosis (and kyphoscoliosis), idiopathic   . Type II or unspecified type diabetes mellitus without mention of complication, uncontrolled   . Encounter for long-term (current) use of other medications   . Unspecified essential hypertension   . Other and unspecified hyperlipidemia   . Benign neoplasm of colon   . Unspecified urinary incontinence   . Carpal tunnel syndrome   . Unspecified hemorrhoids without mention of complication   . Diffuse cystic mastopathy   . Unspecified hypothyroidism   . Obesity, unspecified   . Cellulitis and abscess of finger, unspecified   . Other psoriasis   . Type II or unspecified type diabetes mellitus without mention of complication, uncontrolled   . Cerumen impaction   . Neck pain 05/23/2015  . Anemia, unspecified     Past Surgical History  Procedure Laterality Date  . Breast surgery      right breast 3 surgeries [phone]  . Cholecystectomy  1992    DR BOWMAN  . Colon surgery      2004,2007,2013.  3 times colon surgieres  . Abdominal  hysterectomy  1987    complete  . Vaginal cyst removed  1967  . 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 Foot surgery  1989  . Excise le mandibular lymph node t      DR C. NEWMAN  . Left shoulder arthroscopy    . Neuroplasty / transposition median nerve at carpal tunnel bilateral  05/2003    Social History  Substance Use Topics  . Smoking status: Former Smoker    Quit date: 09/07/1989  . Smokeless tobacco: Never Used  . Alcohol Use: No    Family History  Problem Relation Age of Onset  . Diabetes Father   . Stroke Father   . Heart disease Father   . Cancer Sister   . Heart disease Brother   . Asthma Sister   . Arthritis Sister     Allergies  Allergen Reactions  . Codeine Hives  . Vesicare [Solifenacin]     Medication list has been reviewed and  updated.  Current Outpatient Prescriptions on File Prior to Visit  Medication Sig Dispense Refill  . albuterol (PROVENTIL HFA;VENTOLIN HFA) 108 (90 BASE) MCG/ACT inhaler Inhale 2 puffs into the lungs every 6 (six) hours as needed for wheezing or shortness of breath. 1 Inhaler 0  . aspirin 81 MG tablet Take 81 mg by mouth daily.    . cetirizine (ZYRTEC) 10 MG tablet Take 10 mg by mouth daily.    . COMFORT LANCETS MISC Use daily to get blood for glucometer 100 each 3  . desoximetasone (TOPICORT) 0.25 % cream     . diltiazem (CARDIZEM CD) 360 MG 24 hr capsule Take 1 capsule by mouth  once a day to control blood pressure 90 capsule 2  . doxazosin (CARDURA) 4 MG tablet One daily to help control BP 90 tablet 3  . Fluticasone-Salmeterol (ADVAIR DISKUS) 100-50 MCG/DOSE AEPB Inhale one puff twice daily for breathing 60 each 3  . furosemide (LASIX) 40 MG tablet One daily to control edema 90 tablet 3  . glimepiride (AMARYL) 2 MG tablet TAKE 1 TABLET BY MOUTH AT  BREAKFAST TO CONTROL  GLUCOSE 90 tablet 1  . glucose blood (BAYER CONTOUR TEST) test strip Use daily too check glucose 100 each 4  . levothyroxine (SYNTHROID, LEVOTHROID) 50 MCG tablet Take 1 tablet by mouth  daily for thyroid  supplement 90 tablet 3  . lisinopril (PRINIVIL,ZESTRIL) 20 MG tablet Take one tablet by mouth once daily for blood pressure 90 tablet 3  . lovastatin (MEVACOR) 40 MG tablet One at bedtime to control cholesterol 90 tablet 3  . metFORMIN (GLUCOPHAGE) 500 MG tablet Take one tablet before  breakfast and one tablet  before supper to control  blood sugars 180 tablet 4  . mirabegron ER (MYRBETRIQ) 25 MG TB24 tablet One daily to help bladder control 30 tablet 5  . oxybutynin (DITROPAN) 5 MG tablet One twice daily to help control bladder 180 tablet 2  . pentazocine-naloxone (TALWIN NX) 50-0.5 MG tablet One every 4 hours if needed for pain 120 tablet 0  . Potassium 95 MG TABS Take by mouth.    . pravastatin (PRAVACHOL) 20 MG tablet  Take 1 tablet by mouth  daily 90 tablet 1  . traMADol (ULTRAM) 50 MG tablet One up to to 4 times daily for pain 120 tablet 0   No current facility-administered medications on file prior to visit.    Review of Systems:  As per HPI- otherwise negative.   Physical Examination: Filed Vitals:  10/18/15 1604  BP: 110/70  Pulse: 98  Temp: 97.6 F (36.4 C)  Resp: 20   Filed Vitals:   10/18/15 1604  Height: _0  (1.6 m)  Weight: 221 lb (100.245 kg)   Body mass index is 39.16 kg/(m^2). Ideal Body Weight: Weight in (lb) to have BMI = 25: 140.8  GEN: WDWN, NAD, Non-toxic, A & O x 3, obese, looks well HEENT: Atraumatic, Normocephalic. Neck supple. No masses, No LAD. Ears and Nose: No external deformity. CV: irregular rapid rhythm- ? A fib. No JVD. No thrill. No extra heart sounds. PULM: CTA B, no wheezes, crackles, rhonchi. No retractions. No resp. distress. No accessory muscle use. ABD: S, NT, ND EXTR: No c/c/e NEURO Normal gait.  PSYCH: Normally interactive. Conversant. Not depressed or anxious appearing.  Calm demeanor.   EKG:  Atrial fib with RVR , ST depression which may be due to strain  Pt took #1 aspirin 81 mg this am and we gave her #3 more in clinic.  Assessment and Plan: SOB (shortness of breath) - Plan: EKG 12-Lead, DG Chest 2 View  Irregular heart beat - Plan: EKG 12-Lead, DG Chest 2 View, aspirin EC tablet 243 mg  Atrial fibrillation, unspecified type (HCC)   Here today with SOB- turned out to be in atrial fibrillation with RVR.  She has also been bothered by some CP recently but not currently.  Called for EMS transport. Placed O2 at 2L, [place].  Attempted IV left AC but not successful.  EMS arrived to transport pt  Signed Lamar Blinks, MD

## 2015-10-18 NOTE — Addendum Note (Signed)
Addended by: Lamar Blinks C on: 10/18/2015 05:40 PM   Modules accepted: Orders

## 2015-10-18 NOTE — ED Notes (Signed)
Pt requesting to ambulate to restroom prior to assessment.

## 2015-10-18 NOTE — Telephone Encounter (Signed)
Left message on voicemail for patient to return call when available

## 2015-10-18 NOTE — ED Notes (Signed)
Nitro paste removed per Dr. Rex Kras

## 2015-10-18 NOTE — Progress Notes (Signed)
Attempted to get report from ed nurse Rod Holler who was busy at that time. Will call again

## 2015-10-18 NOTE — H&P (Addendum)
History & Physical    Patient ID: Patricia [name] MRN: [id number], DOB/AGE: Mar 19, 1936  Admit date: 10/18/2015 Primary Physician: Estill Dooms, MD  History of Present Illness    79 yo F w a h/o Asthma, DM, HTN, HLD, Hypothyroidism, Anemia, & Obesity presented to the ED by EMS with 3 days of exertional dyspnea, though when further questioned this has been presented to a milder degree for the preceding 3 weeks.  She initially attributed this to her asthma +/- a URI, though she denied coughing worse than her baseline or fevers.  She became concerned when her symptoms failed to improved with her rescue Albuterol.  She additionally endorsed mild chest discomfort but clarified that this was more related to her breathing hard than to intrinsic pain.  At baseline, she has occasional lightheadedness & dyspnea on exertion but nothing to the extent she has experienced more recently.  She denied symptoms at rest.  She also denied palpitations, nausea, diaphoresis, lower extremity swelling, abdominal swelling.  orthopnea, or paroxysmal nocturnal dyspnea.    Of note, the pt had seen her PCP Dr. Jeanmarie Hubert 09/26/15 when a new systolic murmur was heard, suspicious for AS.  Dr. Nyoka Cowden had then ordered an echocardiogram to be completed.  She has not yet had this completed as it had been her impression that the echocardiogram was more for the purpose of clearing her for shoulder surgery, which she is delaying as much as possible.  At baseline, she lives alone & cares for herself, including yard work [employer].  Otherwise, she denied cardiac history.  She had undergone stress testing per Dr. Daneen Schick, which was normal to her knowledge.    While she was in the ED, she was treated with Diltiazem 10 mg, 15 mg, & a gtt.  She was also given Furosemide 40 mg IV x 1 & NTG 0.5", which was later removed.    Relevant Data - Vitals:  T 97.5 HR 137, BP 122/71, O2 97% RA,  - EKG:  Afib with RVR, LVH with  repolarization abnormalities slightly more prominent than 2014 - Labs:  BNP 436, Troponin 0.01, Hgb 10.0, TSH 3.15  - CXR:  Cardiomegaly & pulmonary edema new since 07/18/15  Past Medical History   Past Medical History  Diagnosis Date  . Allergy   . Arthritis   . Asthma   . Diabetes mellitus without complication ([place])   . Heart murmur   . Thyroid disease   . History of aortic valve disease   . Diarrhea   . Urge incontinence   . Lumbago   . Edema   . Cervicalgia   . Chronic airway obstruction, not elsewhere classified   . Pain in joint, lower leg   . Routine gynecological examination   . Scoliosis (and kyphoscoliosis), idiopathic   . Type II or unspecified type diabetes mellitus without mention of complication, uncontrolled   . Encounter for long-term (current) use of other medications   . Unspecified essential hypertension   . Other and unspecified hyperlipidemia   . Benign neoplasm of colon   . Unspecified urinary incontinence   . Carpal tunnel syndrome   . Unspecified hemorrhoids without mention of complication   . Diffuse cystic mastopathy   . Unspecified hypothyroidism   . Obesity, unspecified   . Cellulitis and abscess of finger, unspecified   . Other psoriasis   . Type II or unspecified type diabetes mellitus without mention of complication, uncontrolled   . Cerumen  impaction   . Neck pain 05/23/2015  . Anemia, unspecified     Past Surgical History  Procedure Laterality Date  . Breast surgery      right breast 3 surgeries [phone]  . Cholecystectomy  1992    DR BOWMAN  . Colon surgery      2004,2007,2013.  3 times colon surgieres  . Abdominal hysterectomy  1987    complete  . Vaginal cyst removed  1967  . 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 Foot surgery  1989  . Excise le mandibular lymph node t      DR C. NEWMAN  . Left shoulder arthroscopy    . Neuroplasty / transposition median nerve at carpal tunnel bilateral  05/2003   Allergies  Allergies    Allergen Reactions  . Codeine Hives  . Vesicare [Solifenacin]     unknown   Home Medications    Prior to Admission medications   Medication Sig Start Date End Date Taking? Authorizing Provider  albuterol (PROVENTIL HFA;VENTOLIN HFA) 108 (90 BASE) MCG/ACT inhaler Inhale 2 puffs into the lungs every 6 (six) hours as needed for wheezing or shortness of breath. 01/24/15  Yes Estill Dooms, MD  aspirin 81 MG tablet Take 81 mg by mouth daily.   Yes Historical Provider, MD  cetirizine (ZYRTEC) 10 MG tablet Take 10 mg by mouth daily.   Yes Historical Provider, MD  diltiazem (CARDIZEM CD) 360 MG 24 hr capsule Take 1 capsule by mouth  once a day to control blood pressure 10/15/15  Yes Estill Dooms, MD  doxazosin (CARDURA) 4 MG tablet One daily to help control BP Patient taking differently: Take 4 mg by mouth daily. One daily to help control BP 10/18/14  Yes Estill Dooms, MD  Fluticasone-Salmeterol (ADVAIR DISKUS) 100-50 MCG/DOSE AEPB Inhale one puff twice daily for breathing Patient taking differently: Inhale 1 puff into the lungs 2 (two) times daily. Inhale one puff twice daily for breathing 01/24/15  Yes Estill Dooms, MD  furosemide (LASIX) 40 MG tablet One daily to control edema Patient taking differently: Take 40 mg by mouth daily as needed. One daily to control edema 07/19/14  Yes Estill Dooms, MD  glimepiride (AMARYL) 2 MG tablet TAKE 1 TABLET BY MOUTH AT  BREAKFAST TO CONTROL  GLUCOSE 07/04/15  Yes Estill Dooms, MD  levothyroxine (SYNTHROID, LEVOTHROID) 50 MCG tablet Take 1 tablet by mouth  daily for thyroid  supplement 08/27/15  Yes Estill Dooms, MD  lisinopril (PRINIVIL,ZESTRIL) 20 MG tablet Take one tablet by mouth once daily for blood pressure 01/08/15  Yes Tiffany L Reed, DO  metFORMIN (GLUCOPHAGE) 500 MG tablet Take 500 mg by mouth 2 (two) times daily with a meal.   Yes Historical Provider, MD  oxybutynin (DITROPAN) 5 MG tablet One twice daily to help control bladder Patient  taking differently: Take 5 mg by mouth daily. One twice daily to help control bladder 09/26/15  Yes Estill Dooms, MD  Potassium 95 MG TABS Take 1 tablet by mouth daily.    Yes Historical Provider, MD  pravastatin (PRAVACHOL) 20 MG tablet Take 1 tablet by mouth  daily 05/07/15  Yes Estill Dooms, MD  COMFORT LANCETS MISC Use daily to get blood for glucometer 03/08/14   Estill Dooms, MD  glucose blood (BAYER CONTOUR TEST) test strip Use daily too check glucose 03/08/14   Estill Dooms, MD  lovastatin (MEVACOR) 40 MG tablet One at bedtime to control  cholesterol Patient not taking: Reported on 10/18/2015 11/02/13   Estill Dooms, MD  metFORMIN (GLUCOPHAGE) 500 MG tablet Take one tablet before  breakfast and one tablet  before supper to control  blood sugars Patient not taking: Reported on 10/18/2015 01/08/15   Estill Dooms, MD  mirabegron ER [facility]) 25 MG TB24 tablet One daily to help bladder control Patient not taking: Reported on 10/18/2015 05/23/15   Estill Dooms, MD  pentazocine-naloxone (TALWIN NX) 50-0.5 MG tablet One every 4 hours if needed for pain Patient not taking: Reported on 10/18/2015 09/26/15   Estill Dooms, MD  traMADol Veatrice Bourbon) 50 MG tablet One up to to 4 times daily for pain Patient not taking: Reported on 10/18/2015 10/18/14   Estill Dooms, MD   Family History    Family History  Problem Relation Age of Onset  . Diabetes Father   . Stroke Father   . Heart disease Father   . Cancer Sister   . Heart disease Brother   . Asthma Sister   . Arthritis Sister    Social History    Social History   Social History  . Marital Status: Divorced    Spouse Name: N/A  . Number of Children: 1  . Years of Education: N/A   Occupational History  . Retired Market researcher for [employer] History Main Topics  . Smoking status: Former Smoker    Quit date: 09/07/1989  . Smokeless tobacco: Never Used  . Alcohol Use: No  . Drug Use: No  . Sexual Activity:  No   Other Topics Concern  . Not on file   Social History Narrative   Her daughter & 3 grandchildren live in the area.       Review of Systems    General:  No chills, fever, night sweats or weight changes.  Cardiovascular:  No chest pain, edema, orthopnea, palpitations, paroxysmal nocturnal dyspnea. Dermatological: No rash, lesions/masses Respiratory: Positive for dyspnea & baseline cough.   Urologic: No hematuria, dysuria Abdominal:   No nausea, vomiting, diarrhea, bright red blood per rectum, melena, or hematemesis Neurologic:  No visual changes, wkns, changes in mental status. All other systems reviewed and are otherwise negative except as noted above.  Physical Exam    Blood pressure 117/55, pulse 41, temperature 97.9 F (36.6 C), temperature source Oral, resp. rate 24, height _0  (1.6 m), weight 96.7 kg (213 lb 3 oz), SpO2 92 %.  General: Pleasant, NAD Psych: Normal affect. Neuro: Alert and oriented X 3. Moves all extremities spontaneously. HEENT: Normal  Neck:  JVD 3 cm above the clavicle while lying at 30 degrees.   Lungs:  Minimal rales bilateral lower lobes.   Heart:  Irregularly irregular.  3/6 SEM LUSB without radiation.   Abdomen: Soft, non-tender, non-distended, BS + x 4.  Extremities: Trace PE b/l LE. DP/PT/Radials 2+ and equal bilaterally.  Labs    Troponin [facility] Test)  Recent Labs  10/18/15 1815  TROPIPOC 0.01   No results for input(s): CKTOTAL, CKMB, TROPONINI in the last 72 hours. Lab Results  Component Value Date   WBC 9.2 10/18/2015   HGB 10.0* 10/18/2015   HCT 32.2* 10/18/2015   MCV 85.2 10/18/2015   PLT 269 10/18/2015    Recent Labs Lab 10/18/15 1800  NA 137  K 4.4  CL 103  CO2 24  BUN 19  CREATININE 0.96  CALCIUM 9.1  GLUCOSE 115*   Lab Results  Component Value Date   CHOL 165 09/24/2015   HDL 53 09/24/2015   LDLCALC 88 09/24/2015   TRIG 119 09/24/2015    Radiology Studies    Dg Chest Portable 1  View  10/18/2015  CLINICAL DATA:  Shortness of breath for 3 days. Atrial fibrillation and tachycardia. EXAM: PORTABLE CHEST 1 VIEW COMPARISON:  PA and lateral chest 07/18/2015. FINDINGS: There is cardiomegaly and pulmonary vascular congestion. No consolidative process, pneumothorax or effusion is identified. Marked degenerative change about the shoulders appears worse on the right. Postoperative change left shoulder noted IMPRESSION: Cardiomegaly and pulmonary vascular congestion. Electronically Signed   By: Inge Rise M.D.   On: 10/18/2015 18:24    ECG & Cardiac Imaging    - EKG:  Afib with RVR, LVH with repolarization abnormalities slightly more prominent than 2014  Assessment & Plan    79 yo F w a h/o DM, HTN, HLD, Hypothyroidism, Asthma, Anemia, & Obesity p/w dyspnea, found to have pulmonary edema in the setting of new Afib with RVR.    # Afib with RVR - CHA2DS2-VASc = 5.  TSH wnl with her current Levothyroxine.   - Therapeutic Lovenox, Diltiazem gtt (replacing her home dose). - She may need a TEE / DCCV, which was preliminary discussed with her.  She is NPO in case.    # Systolic ejection murmur - Based on her age, this is most likely due to AS. - Have ordered a TTE (in addition to the likely need for a TEE).  # Volume overload - This is more likely related to her acute AF +/- valvular disease.  At home, she stated that she only takes Furosemide every several weeks. - Monitor strict I/O & daily weights. - Will give additional Furosemide for goal I/O of -1 to 2 L.    # h/o DM - Transition from oral medication to SSI while in-house.    # h/o HTN - BP soft with Diltiazem gtt. - Hold home Lisinopril & Doxazosin for now.  - Will hold her home ASA while receiving AC.    # h/o HLD - As of 09/24/15, TC 165, TG 119, LDL 88, HDL 53.   - Continue home Pravastatin.    # h/o Hypothyroidism - Continue home Levothyroxine.    # h/o Overactive bladder  - Continue home Oxybutynin.     # PPX - PPI, Lovenox  # Full code confirmed with the pt  Signed, Alfonso Ramus, MD 10/19/2015, 1:58 AM

## 2015-10-18 NOTE — ED Provider Notes (Signed)
CSN: [id number]     Arrival date & time 10/18/15  1749 History   First MD Initiated Contact with Patient 10/18/15 1801     Chief Complaint  Patient presents with  . Atrial Fibrillation  . Shortness of Breath     (Consider location/radiation/quality/duration/timing/severity/associated sxs/prior Treatment) HPI Comments: 79 year old female with extensive past medical history including hypertension, type 2 diabetes mellitus, asthma, hyperlipidemia who presents with shortness of breath. Patient states that she has a history of asthma and has been short of breath recently but thought that it was related to asthma. 3 days ago, her shortness of breath became progressively worse and now she has severe shortness of breath with minimal exertion. She also reports central, nonradiating chest pain with exertion that is not present at rest. She was sent here from an urgent care after she was found to be in atrial fibrillation. She denies any history of A. fib. She denies any heart racing sensation or shortness of breath laying flat. She has not noticed any recent changes in her weight or lower extremity edema. She denies any cough/cold symptoms, vomiting, diarrhea, abdominal pain, or recent illness.  Patient is a 79 y.o. female presenting with atrial fibrillation and shortness of breath. The history is provided by the patient.  Atrial Fibrillation Associated symptoms include shortness of breath.  Shortness of Breath   Past Medical History  Diagnosis Date  . Allergy   . Arthritis   . Asthma   . Diabetes mellitus without complication ([place])   . Heart murmur   . Thyroid disease   . History of aortic valve disease   . Diarrhea   . Urge incontinence   . Lumbago   . Edema   . Cervicalgia   . Chronic airway obstruction, not elsewhere classified   . Pain in joint, lower leg   . Routine gynecological examination   . Scoliosis (and kyphoscoliosis), idiopathic   . Type II or unspecified type diabetes  mellitus without mention of complication, uncontrolled   . Encounter for long-term (current) use of other medications   . Unspecified essential hypertension   . Other and unspecified hyperlipidemia   . Benign neoplasm of colon   . Unspecified urinary incontinence   . Carpal tunnel syndrome   . Unspecified hemorrhoids without mention of complication   . Diffuse cystic mastopathy   . Unspecified hypothyroidism   . Obesity, unspecified   . Cellulitis and abscess of finger, unspecified   . Other psoriasis   . Type II or unspecified type diabetes mellitus without mention of complication, uncontrolled   . Cerumen impaction   . Neck pain 05/23/2015  . Anemia, unspecified    Past Surgical History  Procedure Laterality Date  . Breast surgery      right breast 3 surgeries [phone]  . Cholecystectomy  1992    DR BOWMAN  . Colon surgery      2004,2007,2013.  3 times colon surgieres  . Abdominal hysterectomy  1987    complete  . Vaginal cyst removed  1967  . 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 Foot surgery  1989  . Excise le mandibular lymph node t      DR C. NEWMAN  . Left shoulder arthroscopy    . Neuroplasty / transposition median nerve at carpal tunnel bilateral  05/2003   Family History  Problem Relation Age of Onset  . Diabetes Father   . Stroke Father   . Heart disease Father   . Cancer Sister   .  Heart disease Brother   . Asthma Sister   . Arthritis Sister    Social History  Substance Use Topics  . Smoking status: Former Smoker    Quit date: 09/07/1989  . Smokeless tobacco: Never Used  . Alcohol Use: No   OB History    No data available     Review of Systems  Respiratory: Positive for shortness of breath.    10 Systems reviewed and are negative for acute change except as noted in the HPI.    Allergies  Codeine and Vesicare  Home Medications   Prior to Admission medications   Medication Sig Start Date End Date Taking? Authorizing Provider  albuterol  (PROVENTIL HFA;VENTOLIN HFA) 108 (90 BASE) MCG/ACT inhaler Inhale 2 puffs into the lungs every 6 (six) hours as needed for wheezing or shortness of breath. 01/24/15  Yes Estill Dooms, MD  aspirin 81 MG tablet Take 81 mg by mouth daily.   Yes Historical Provider, MD  cetirizine (ZYRTEC) 10 MG tablet Take 10 mg by mouth daily.   Yes Historical Provider, MD  diltiazem (CARDIZEM CD) 360 MG 24 hr capsule Take 1 capsule by mouth  once a day to control blood pressure 10/15/15  Yes Estill Dooms, MD  doxazosin (CARDURA) 4 MG tablet One daily to help control BP Patient taking differently: Take 4 mg by mouth daily. One daily to help control BP 10/18/14  Yes Estill Dooms, MD  Fluticasone-Salmeterol (ADVAIR DISKUS) 100-50 MCG/DOSE AEPB Inhale one puff twice daily for breathing Patient taking differently: Inhale 1 puff into the lungs 2 (two) times daily. Inhale one puff twice daily for breathing 01/24/15  Yes Estill Dooms, MD  furosemide (LASIX) 40 MG tablet One daily to control edema Patient taking differently: Take 40 mg by mouth daily as needed. One daily to control edema 07/19/14  Yes Estill Dooms, MD  glimepiride (AMARYL) 2 MG tablet TAKE 1 TABLET BY MOUTH AT  BREAKFAST TO CONTROL  GLUCOSE 07/04/15  Yes Estill Dooms, MD  levothyroxine (SYNTHROID, LEVOTHROID) 50 MCG tablet Take 1 tablet by mouth  daily for thyroid  supplement 08/27/15  Yes Estill Dooms, MD  lisinopril (PRINIVIL,ZESTRIL) 20 MG tablet Take one tablet by mouth once daily for blood pressure 01/08/15  Yes Tiffany L Reed, DO  metFORMIN (GLUCOPHAGE) 500 MG tablet Take 500 mg by mouth 2 (two) times daily with a meal.   Yes Historical Provider, MD  oxybutynin (DITROPAN) 5 MG tablet One twice daily to help control bladder Patient taking differently: Take 5 mg by mouth daily. One twice daily to help control bladder 09/26/15  Yes Estill Dooms, MD  Potassium 95 MG TABS Take 1 tablet by mouth daily.    Yes Historical Provider, MD  pravastatin  (PRAVACHOL) 20 MG tablet Take 1 tablet by mouth  daily 05/07/15  Yes Estill Dooms, MD  COMFORT LANCETS MISC Use daily to get blood for glucometer 03/08/14   Estill Dooms, MD  glucose blood (BAYER CONTOUR TEST) test strip Use daily too check glucose 03/08/14   Estill Dooms, MD  lovastatin (MEVACOR) 40 MG tablet One at bedtime to control cholesterol Patient not taking: Reported on 10/18/2015 11/02/13   Estill Dooms, MD  metFORMIN (GLUCOPHAGE) 500 MG tablet Take one tablet before  breakfast and one tablet  before supper to control  blood sugars Patient not taking: Reported on 10/18/2015 01/08/15   Estill Dooms, MD  mirabegron ER [facility]) 25  MG TB24 tablet One daily to help bladder control Patient not taking: Reported on 10/18/2015 05/23/15   Estill Dooms, MD  pentazocine-naloxone (TALWIN NX) 50-0.5 MG tablet One every 4 hours if needed for pain Patient not taking: Reported on 10/18/2015 09/26/15   Estill Dooms, MD  traMADol Veatrice Bourbon) 50 MG tablet One up to to 4 times daily for pain Patient not taking: Reported on 10/18/2015 10/18/14   Estill Dooms, MD   BP 128/80 mmHg  Pulse 90  Temp(Src) 98.1 F (36.7 C) (Oral)  Resp 20  SpO2 95% Physical Exam  Constitutional: She is oriented to person, place, and time. She appears well-developed and well-nourished. No distress.  Mildly dyspneic  HENT:  Head: Normocephalic and atraumatic.  Moist mucous membranes  Eyes: Conjunctivae are normal. Pupils are equal, round, and reactive to light.  Neck: Neck supple.  Cardiovascular: Normal heart sounds.   No murmur heard. Tachycardic, irregularly irregular rhythm  Pulmonary/Chest: No respiratory distress.  Diminished BS b/l w/ mildly increased WOB, no wheezes  Abdominal: Soft. Bowel sounds are normal. She exhibits no distension. There is no tenderness.  Musculoskeletal:  1+ pitting edema b/l LE  Neurological: She is alert and oriented to person, place, and time.  Fluent speech  Skin: Skin is  warm and dry.  Psychiatric: She has a normal mood and affect. Judgment normal.  Nursing note and vitals reviewed.   ED Course  .Critical Care Performed by: Sharlett Iles Authorized by: Sharlett Iles Total critical care time: 45 minutes Critical care time was exclusive of separately billable procedures and treating other patients. Critical care was necessary to treat or prevent imminent or life-threatening deterioration of the following conditions: cardiac failure. Critical care was time spent personally by me on the following activities: development of treatment plan with patient or surrogate, discussions with consultants, evaluation of patient's response to treatment, examination of patient, obtaining history from patient or surrogate, ordering and performing treatments and interventions, ordering and review of laboratory studies, ordering and review of radiographic studies, re-evaluation of patient's condition and review of old charts.   (including critical care time) Labs Review Labs Reviewed  BASIC METABOLIC PANEL - Abnormal; Notable for the following:    Glucose, Bld 115 (*)    GFR calc non Af Amer 56 (*)    All other components within normal limits  CBC - Abnormal; Notable for the following:    RBC 3.78 (*)    Hemoglobin 10.0 (*)    HCT 32.2 (*)    RDW 16.4 (*)    All other components within normal limits  BRAIN NATRIURETIC PEPTIDE - Abnormal; Notable for the following:    B Natriuretic Peptide 436.0 (*)    All other components within normal limits  MAGNESIUM  TSH  I-STAT TROPOININ, ED    Imaging Review Dg Chest Portable 1 View  10/18/2015  CLINICAL DATA:  Shortness of breath for 3 days. Atrial fibrillation and tachycardia. EXAM: PORTABLE CHEST 1 VIEW COMPARISON:  PA and lateral chest 07/18/2015. FINDINGS: There is cardiomegaly and pulmonary vascular congestion. No consolidative process, pneumothorax or effusion is identified. Marked degenerative change  about the shoulders appears worse on the right. Postoperative change left shoulder noted IMPRESSION: Cardiomegaly and pulmonary vascular congestion. Electronically Signed   By: Inge Rise M.D.   On: 10/18/2015 18:24   I have personally reviewed and evaluated these images and lab results as part of my medical decision-making.   EKG Interpretation   Date/Time:  Thursday October 18 2015 18:06:23 EST Ventricular Rate:  112 PR Interval:    QRS Duration: 91 QT Interval:  296 QTC Calculation: 404 R Axis:   70 Text Interpretation:  Atrial fibrillation Repol abnrm suggests ischemia,  diffuse leads Atrial fibrillation new from previous T wave inversions in  lateral leads Confirmed by Castella Lerner MD, Wiktoria Hemrick [phone]) on 10/18/2015 6:24:29  PM     Medications  diltiazem (CARDIZEM) injection 10 mg (10 mg Intravenous Given 10/18/15 1823)  nitroGLYCERIN (NITROGLYN) 2 % ointment 0.5 inch (0.5 inches Topical Given 10/18/15 1833)  furosemide (LASIX) injection 40 mg (40 mg Intravenous Given 10/18/15 1832)  diltiazem (CARDIZEM) injection 15 mg (15 mg Intravenous Given 10/18/15 1948)  diltiazem (CARDIZEM) 100 mg in dextrose 5 % 100 mL (1 mg/mL) infusion (5 mg/hr Intravenous New Bag/Given 10/18/15 2107)    MDM   Final diagnoses:  Atrial fibrillation with rapid ventricular response (HCC)  Acute congestive heart failure, unspecified congestive heart failure type ([place])    Pt p/w 3d of progressively worsening shortness of breath associated with chest pain on exertion. She was noted to be in atrial fibrillation during transport and given 325 mg aspirin. On exam, she was mildly dyspneic with diminished breath sounds but no respiratory distress. She was tachycardic up to 150 on the monitor. EKG shows atrial fibrillation with T-wave inversions in lateral leads. Gave the patient 10 mg IV diltiazem and obtained above lab work as well as chest x-ray.  CXR shows cardiomegaly and vascular congestion. Pt has no  history of heart failure however CXR suggests CHF and BNP borderline at  436. Troponin negative and creatinine normal. Gave IV lasix and NTG. Later gave the patient 15 mg IV diltiazem which mildly improved her heart rate but patient continues to be in A. fib with RVR. Discontinue nitroglycerin and began diltiazem drip. I discussed with cardiology, Dr. Rosanna Randy, who has accepted the admission. On reassessment, the patient remains comfortable on room air with normal work of breathing. Patient admitted to stepdown unit for further care.   Sharlett Iles, MD 10/18/15 2113

## 2015-10-19 ENCOUNTER — Encounter (HOSPITAL_COMMUNITY): Payer: Self-pay | Admitting: Internal Medicine

## 2015-10-19 ENCOUNTER — Observation Stay (HOSPITAL_COMMUNITY): Payer: Medicare Other | Admitting: Certified Registered Nurse Anesthetist

## 2015-10-19 ENCOUNTER — Observation Stay (HOSPITAL_BASED_OUTPATIENT_CLINIC_OR_DEPARTMENT_OTHER): Payer: Medicare Other

## 2015-10-19 ENCOUNTER — Encounter (HOSPITAL_COMMUNITY)
Admission: EM | Disposition: A | Discharge status: Home / Self Care | Payer: Self-pay | Source: Home / Self Care | Attending: Cardiology

## 2015-10-19 DIAGNOSIS — I4891 Unspecified atrial fibrillation: Secondary | ICD-10-CM | POA: Diagnosis not present

## 2015-10-19 DIAGNOSIS — E785 Hyperlipidemia, unspecified: Secondary | ICD-10-CM

## 2015-10-19 DIAGNOSIS — I340 Nonrheumatic mitral (valve) insufficiency: Secondary | ICD-10-CM | POA: Diagnosis not present

## 2015-10-19 DIAGNOSIS — I509 Heart failure, unspecified: Secondary | ICD-10-CM

## 2015-10-19 DIAGNOSIS — R011 Cardiac murmur, unspecified: Secondary | ICD-10-CM

## 2015-10-19 DIAGNOSIS — E119 Type 2 diabetes mellitus without complications: Secondary | ICD-10-CM

## 2015-10-19 DIAGNOSIS — E8779 Other fluid overload: Secondary | ICD-10-CM

## 2015-10-19 DIAGNOSIS — I10 Essential (primary) hypertension: Secondary | ICD-10-CM

## 2015-10-19 HISTORY — PX: TEE WITHOUT CARDIOVERSION: SHX5443

## 2015-10-19 HISTORY — PX: CARDIOVERSION: SHX1299

## 2015-10-19 LAB — COMPREHENSIVE METABOLIC PANEL
ALT: 17 U/L (ref 14–54)
AST: 16 U/L (ref 15–41)
Albumin: 3.4 g/dL — ABNORMAL LOW (ref 3.5–5.0)
Alkaline Phosphatase: 57 U/L (ref 38–126)
Anion gap: 10 (ref 5–15)
BUN: 20 mg/dL (ref 6–20)
CO2: 28 mmol/L (ref 22–32)
Calcium: 9 mg/dL (ref 8.9–10.3)
Chloride: 102 mmol/L (ref 101–111)
Creatinine, Ser: 1.05 mg/dL — ABNORMAL HIGH (ref 0.44–1.00)
GFR calc Af Amer: 58 mL/min — ABNORMAL LOW (ref >60)
GFR calc non Af Amer: 50 mL/min — ABNORMAL LOW (ref >60)
Glucose, Bld: 149 mg/dL — ABNORMAL HIGH (ref 65–99)
Potassium: 3.9 mmol/L (ref 3.5–5.1)
Sodium: 140 mmol/L (ref 135–145)
Total Bilirubin: 0.7 mg/dL (ref 0.3–1.2)
Total Protein: 6.8 g/dL (ref 6.5–8.1)

## 2015-10-19 LAB — CBC
HCT: 29.6 % — ABNORMAL LOW (ref 36.0–46.0)
Hemoglobin: 9.6 g/dL — ABNORMAL LOW (ref 12.0–15.0)
MCH: 27.7 pg (ref 26.0–34.0)
MCHC: 32.4 g/dL (ref 30.0–36.0)
MCV: 85.3 fL (ref 78.0–100.0)
Platelets: 253 10*3/uL (ref 150–400)
RBC: 3.47 MIL/uL — ABNORMAL LOW (ref 3.87–5.11)
RDW: 16.5 % — ABNORMAL HIGH (ref 11.5–15.5)
WBC: 7.1 10*3/uL (ref 4.0–10.5)

## 2015-10-19 LAB — PROTIME-INR
INR: 1.14 (ref 0.00–1.49)
Prothrombin Time: 14.8 seconds (ref 11.6–15.2)

## 2015-10-19 LAB — GLUCOSE, CAPILLARY
Glucose-Capillary: 141 mg/dL — ABNORMAL HIGH (ref 65–99)
Glucose-Capillary: 124 mg/dL — ABNORMAL HIGH (ref 65–99)
Glucose-Capillary: 127 mg/dL — ABNORMAL HIGH (ref 65–99)
Glucose-Capillary: 128 mg/dL — ABNORMAL HIGH (ref 65–99)
Glucose-Capillary: 90 mg/dL (ref 65–99)

## 2015-10-19 LAB — MRSA PCR SCREENING: MRSA by PCR: NEGATIVE

## 2015-10-19 LAB — MAGNESIUM: Magnesium: 1.8 mg/dL (ref 1.7–2.4)

## 2015-10-19 SURGERY — ECHOCARDIOGRAM, TRANSESOPHAGEAL
Anesthesia: Monitor Anesthesia Care

## 2015-10-19 MED ORDER — DILTIAZEM HCL 100 MG IV SOLR
5.0000 mg/h | INTRAVENOUS | Status: DC
  Administered 2015-10-19 (×2): 15 mg/h via INTRAVENOUS
  Administered 2015-10-20: 5 mg/h via INTRAVENOUS
  Filled 2015-10-19 (×4): qty 100

## 2015-10-19 MED ORDER — MOMETASONE FURO-FORMOTEROL FUM 100-5 MCG/ACT IN AERO
2.0000 | INHALATION_SPRAY | Freq: Two times a day (BID) | RESPIRATORY_TRACT | Status: DC
  Administered 2015-10-19 – 2015-10-21 (×4): 2 via RESPIRATORY_TRACT
  Filled 2015-10-19: qty 8.8

## 2015-10-19 MED ORDER — ACETAMINOPHEN 325 MG PO TABS
650.0000 mg | ORAL_TABLET | ORAL | Status: DC | PRN
  Administered 2015-10-19: 650 mg via ORAL
  Filled 2015-10-19: qty 2

## 2015-10-19 MED ORDER — LACTATED RINGERS IV SOLN
INTRAVENOUS | Status: DC | PRN
  Administered 2015-10-19: 14:00:00 via INTRAVENOUS

## 2015-10-19 MED ORDER — DOXAZOSIN MESYLATE 4 MG PO TABS
4.0000 mg | ORAL_TABLET | Freq: Every day | ORAL | Status: DC
  Administered 2015-10-19 – 2015-10-21 (×3): 4 mg via ORAL
  Filled 2015-10-19 (×3): qty 1

## 2015-10-19 MED ORDER — PROPOFOL 10 MG/ML IV BOLUS
INTRAVENOUS | Status: DC | PRN
  Administered 2015-10-19: 40 mg via INTRAVENOUS
  Administered 2015-10-19 (×2): 20 mg via INTRAVENOUS

## 2015-10-19 MED ORDER — COUMADIN BOOK
Freq: Once | Status: DC
  Filled 2015-10-19: qty 1

## 2015-10-19 MED ORDER — LEVOTHYROXINE SODIUM 50 MCG PO TABS
50.0000 ug | ORAL_TABLET | Freq: Every day | ORAL | Status: DC
  Administered 2015-10-19 – 2015-10-21 (×3): 50 ug via ORAL
  Filled 2015-10-19 (×3): qty 1

## 2015-10-19 MED ORDER — FUROSEMIDE 10 MG/ML IJ SOLN
40.0000 mg | Freq: Two times a day (BID) | INTRAMUSCULAR | Status: DC
  Administered 2015-10-19 – 2015-10-20 (×3): 40 mg via INTRAVENOUS
  Filled 2015-10-19 (×3): qty 4

## 2015-10-19 MED ORDER — OFF THE BEAT BOOK
Freq: Once | Status: AC
  Administered 2015-10-19: 04:00:00
  Filled 2015-10-19: qty 1

## 2015-10-19 MED ORDER — GLIMEPIRIDE 2 MG PO TABS
2.0000 mg | ORAL_TABLET | Freq: Every day | ORAL | Status: DC
  Administered 2015-10-20 – 2015-10-21 (×2): 2 mg via ORAL
  Filled 2015-10-19 (×3): qty 1

## 2015-10-19 MED ORDER — ONDANSETRON HCL 4 MG/2ML IJ SOLN: 4.0000 mg | Freq: Once | INTRAMUSCULAR | Status: DC | PRN

## 2015-10-19 MED ORDER — ONDANSETRON HCL 4 MG/2ML IJ SOLN
INTRAMUSCULAR | Status: DC | PRN
  Administered 2015-10-19: 4 mg via INTRAVENOUS

## 2015-10-19 MED ORDER — PRAVASTATIN SODIUM 40 MG PO TABS
20.0000 mg | ORAL_TABLET | Freq: Every day | ORAL | Status: DC
  Administered 2015-10-19 – 2015-10-20 (×2): 20 mg via ORAL
  Filled 2015-10-19 (×2): qty 1

## 2015-10-19 MED ORDER — OXYBUTYNIN CHLORIDE 5 MG PO TABS
5.0000 mg | ORAL_TABLET | Freq: Every day | ORAL | Status: DC
  Administered 2015-10-19 – 2015-10-21 (×3): 5 mg via ORAL
  Filled 2015-10-19 (×3): qty 1

## 2015-10-19 MED ORDER — INSULIN ASPART 100 UNIT/ML ~~LOC~~ SOLN
0.0000 [IU] | SUBCUTANEOUS | Status: DC
  Administered 2015-10-19 (×4): 2 [IU] via SUBCUTANEOUS

## 2015-10-19 MED ORDER — SODIUM CHLORIDE 0.9 % IV SOLN
INTRAVENOUS | Status: DC
  Administered 2015-10-19: 11:00:00 via INTRAVENOUS

## 2015-10-19 MED ORDER — ENOXAPARIN SODIUM 100 MG/ML ~~LOC~~ SOLN: 1.0000 mg/kg | Freq: Two times a day (BID) | SUBCUTANEOUS | Status: DC

## 2015-10-19 MED ORDER — BUTAMBEN-TETRACAINE-BENZOCAINE 2-2-14 % EX AERO
INHALATION_SPRAY | CUTANEOUS | Status: DC | PRN
  Administered 2015-10-19: 2 via TOPICAL

## 2015-10-19 MED ORDER — ONDANSETRON HCL 4 MG/2ML IJ SOLN: 4.0000 mg | Freq: Four times a day (QID) | INTRAMUSCULAR | Status: DC | PRN

## 2015-10-19 MED ORDER — ENOXAPARIN SODIUM 100 MG/ML ~~LOC~~ SOLN
1.0000 mg/kg | SUBCUTANEOUS | Status: AC
  Administered 2015-10-19: 95 mg via SUBCUTANEOUS
  Filled 2015-10-19: qty 1

## 2015-10-19 MED ORDER — PROPOFOL 500 MG/50ML IV EMUL
INTRAVENOUS | Status: DC | PRN
  Administered 2015-10-19: 75 ug/kg/min via INTRAVENOUS

## 2015-10-19 MED ORDER — FENTANYL CITRATE (PF) 100 MCG/2ML IJ SOLN: 25.0000 ug | INTRAMUSCULAR | Status: DC | PRN

## 2015-10-19 MED ORDER — ENOXAPARIN SODIUM 100 MG/ML ~~LOC~~ SOLN
1.0000 mg/kg | Freq: Two times a day (BID) | SUBCUTANEOUS | Status: AC
  Administered 2015-10-19 – 2015-10-20 (×3): 95 mg via SUBCUTANEOUS
  Filled 2015-10-19 (×3): qty 1

## 2015-10-19 MED ORDER — INSULIN ASPART 100 UNIT/ML ~~LOC~~ SOLN
0.0000 [IU] | Freq: Three times a day (TID) | SUBCUTANEOUS | Status: DC
  Administered 2015-10-20: 2 [IU] via SUBCUTANEOUS

## 2015-10-19 NOTE — Anesthesia Postprocedure Evaluation (Signed)
Anesthesia Post Note  Patient: Patricia [name]  Procedure(s) Performed: Procedure(s) (LRB): Transesophageal Echocardiogram (TEE)  (N/A) CARDIOVERSION (N/A)  Patient location during evaluation: PACU Anesthesia Type: MAC Level of consciousness: awake and alert Pain management: pain level controlled Vital Signs Assessment: post-procedure vital signs reviewed and stable Respiratory status: spontaneous breathing, nonlabored ventilation, respiratory function stable and patient connected to nasal cannula oxygen Cardiovascular status: stable and blood pressure returned to baseline Anesthetic complications: no    Last Vitals:  Filed Vitals:   10/19/15 1447 10/19/15 1500  BP: 84/62 110/45  Pulse: 82 64  Temp: 36.5 C   Resp: 17 21    Last Pain:  Filed Vitals:   10/19/15 1500  PainSc: 0-No pain                 Catalina Gravel

## 2015-10-19 NOTE — Progress Notes (Signed)
Benefits check results: Eliquis copay is $168.79/90 day mail-order supply.  REQUIRES PRIOR AUTHORIZATION: [phone] 30 day free card was provided, but patient is interested in seeing if a less expensive medication is an option.  Note was left on the electronic chart for physician.   Lorne Skeens RN, MSN [phone]

## 2015-10-19 NOTE — Care Management Note (Signed)
Case Management Note  Patient Details  Name: DOMINO HOLTEN MRN: [id number] Date of Birth: [birth year]  Subjective/Objective:                    Action/Plan: Patient was admitted with exertional dyspnea. Lives at home alone. Will follow for discharge needs pending PT/OT evals and physician orders.  CM consult was received to check patient's copay for Eliquis.  Benefits check initiated.  Expected Discharge Date:  10/21/15               Expected Discharge Plan:     In-House Referral:     Discharge planning Services     Post Acute Care Choice:    Choice offered to:     DME Arranged:    DME Agency:     HH Arranged:    HH Agency:     Status of Service:  In process, will continue to follow  Medicare Important Message Given:    Date Medicare IM Given:    Medicare IM give by:    Date Additional Medicare IM Given:    Additional Medicare Important Message give by:     If discussed at [place] of Stay Meetings, dates discussed:    Additional Comments:  Rolm Baptise, RN 10/19/2015, 10:30 AM

## 2015-10-19 NOTE — Progress Notes (Signed)
See full TEE report in [facility]; patient sedated by anesthesia with diprovan 120 mg IV total; normal LV function; biatrial enlargement; no LAA thrombus; patient subsequently had DCCV with 120J and 150J unsuccessfully; third attempt with 200 J resulted in NSR; no immediate complications; continue anticoagulation for 4 weeks post procedure uninterrupted. Kirk Ruths

## 2015-10-19 NOTE — Progress Notes (Signed)
  Echocardiogram Echocardiogram Transesophageal has been performed.  Patricia [name] 10/19/2015, 2:55 PM

## 2015-10-19 NOTE — Progress Notes (Signed)
ANTICOAGULATION CONSULT NOTE - Initial Consult  Pharmacy Consult for Enoxaparin > apixaban  Indication: atrial fibrillation  Allergies  Allergen Reactions  . Codeine Hives  . Vesicare [Solifenacin]     unknown    Patient Measurements: Height: 5' 3" (160 cm) Weight: 213 lb 3 oz (96.7 kg) IBW/kg (Calculated) : 52.4   Vital Signs: Temp: 97.7 F (36.5 C) (12/23 0755) Temp Source: Oral (12/23 0755) BP: 120/71 mmHg (12/23 0755) Pulse Rate: 84 (12/23 0755)  Labs:  Recent Labs  10/18/15 1800 10/19/15 0334  HGB 10.0* 9.6*  HCT 32.2* 29.6*  PLT 269 253  LABPROT  --  14.8  INR  --  1.14  CREATININE 0.96 1.05*    Estimated Creatinine Clearance: 49.7 mL/min (by C-G formula based on Cr of 1.05).   Medical History: Past Medical History  Diagnosis Date  . Allergy   . Arthritis   . Asthma   . Diabetes mellitus without complication ([place])   . Heart murmur   . Thyroid disease   . History of aortic valve disease   . Diarrhea   . Urge incontinence   . Lumbago   . Edema   . Cervicalgia   . Chronic airway obstruction, not elsewhere classified   . Pain in joint, lower leg   . Routine gynecological examination   . Scoliosis (and kyphoscoliosis), idiopathic   . Type II or unspecified type diabetes mellitus without mention of complication, uncontrolled   . Encounter for long-term (current) use of other medications   . Unspecified essential hypertension   . Other and unspecified hyperlipidemia   . Benign neoplasm of colon   . Unspecified urinary incontinence   . Carpal tunnel syndrome   . Unspecified hemorrhoids without mention of complication   . Diffuse cystic mastopathy   . Unspecified hypothyroidism   . Obesity, unspecified   . Cellulitis and abscess of finger, unspecified   . Other psoriasis   . Type II or unspecified type diabetes mellitus without mention of complication, uncontrolled   . Cerumen impaction   . Neck pain 05/23/2015  . Anemia, unspecified       Assessment: 79yof 3 days of exertional dyspnea, though when further questioned this has been presented to a milder degree for the preceding 3 weeks. She initially attributed this to her asthma +/- a URI, though she denied coughing worse than her baseline or fevers. She became concerned when her symptoms failed to improved with her rescue Albuterol. Found to be in new Afib - RVR rate now improved 80s on Diltiazem drip.  Plan TEE/DCCV Enoxaparin 64m/kg = 969m ordered on admit now and q12 - dose ok fro renal fx Cr 1, CrCl 5027min. Recommended to start DOASt. [place] DCCV to decrease stroke risk  but time a factor so will start  Enoxaparin now and x48hr post DCCV (highest risk is 48hr post then diminishes) then change to oral anticoagulation after.    Goal of Therapy:  Anti-Xa level 0.6-1 units/ml 4hrs after LMWH dose given Monitor platelets by anticoagulation protocol: Yes   Plan:  Enoxaparin 1mg49m = 95mg41mnow Then q12 until 48hr post DCCV Then apixaban 5mg B5mMonitor s/s bleeding and renal fx  Roshard Rezabek CBonnita Nasuti.D. CPP, BCPS Clinical Pharmacist [phone] 8:38 AM

## 2015-10-19 NOTE — Anesthesia Procedure Notes (Signed)
Procedure Name: MAC Performed by: [place] Pink Pre-anesthesia Checklist: Patient identified, Emergency Drugs available, Suction available, Patient being monitored and Timeout performed Patient Re-evaluated:Patient Re-evaluated prior to inductionOxygen Delivery Method: Nasal cannula Placement Confirmation: positive ETCO2 Dental Injury: Teeth and Oropharynx as per pre-operative assessment

## 2015-10-19 NOTE — Progress Notes (Signed)
  Echocardiogram 2D Echocardiogram has been performed.  Patricia [name] M 10/19/2015, 11:47 AM

## 2015-10-19 NOTE — Transfer of Care (Signed)
Immediate Anesthesia Transfer of Care Note  Patient: Patricia [name]  Procedure(s) Performed: Procedure(s): Transesophageal Echocardiogram (TEE)  (N/A) CARDIOVERSION (N/A)  Patient Location: Endoscopy Unit  Anesthesia Type:MAC  Level of Consciousness: awake, alert  and oriented  Airway & Oxygen Therapy: Patient Spontanous Breathing  Post-op Assessment: Report given to RN and Post -op Vital signs reviewed and stable  Post vital signs: Reviewed and stable  Last Vitals:  Filed Vitals:   10/19/15 1447 10/19/15 1500  BP: 84/62 110/45  Pulse: 82 64  Temp: 36.5 C   Resp: 17 21    Complications: No apparent anesthesia complications

## 2015-10-19 NOTE — Anesthesia Preprocedure Evaluation (Addendum)
Anesthesia Evaluation  Patient identified by MRN, date of birth, ID band Patient awake    Reviewed: Allergy & Precautions, NPO status , Patient's Chart, lab work & pertinent test results  History of Anesthesia Complications Negative for: history of anesthetic complications  Airway Mallampati: III  TM Distance: <3 FB Neck ROM: Full    Dental  (+) Dental Advisory Given, Upper Dentures, Lower Dentures   Pulmonary asthma , former smoker,    Pulmonary exam normal breath sounds clear to auscultation       Cardiovascular hypertension, +CHF  Normal cardiovascular exam+ dysrhythmias Atrial Fibrillation + Valvular Problems/Murmurs AS  Rhythm:Regular Rate:Normal  TTE 10/19/2015: Study Conclusions  - Left ventricle: The cavity size was normal. There was mildconcentric hypertrophy. Systolic function was normal. Theestimated ejection fraction was in the range of 55% to 60%. Wallmotion was normal; there were no regional wall motionabnormalities. Doppler parameters are consistent with elevatedmean left atrial filling pressure. - Aortic valve: There was moderate stenosis. Valve area (VTI): 1.21cm^2. Valve area (Vmax): 1.18 cm^2. Valve area (Vmean): 1.19cm^2. - Mitral valve: Calcified annulus. Mildly thickened leaflets . There was mild to moderate regurgitation directed centrally. Valve area by continuity equation (using LVOT flow): 1.85 cm^2. - Left atrium: The atrium was severely dilated. - Right atrium: The atrium was mildly dilated. - Pulmonary arteries: Systolic pressure was mildly increased. PApeak pressure: 42 mm Hg (S).    Neuro/Psych negative neurological ROS  negative psych ROS   GI/Hepatic negative GI ROS, Neg liver ROS,   Endo/Other  diabetes, Type 2, Oral Hypoglycemic AgentsHypothyroidism Obesity   Renal/GU negative Renal ROS Bladder dysfunction      Musculoskeletal  (+) Arthritis ,   Abdominal   Peds  Hematology  (+) Blood dyscrasia, anemia ,   Anesthesia Other Findings Day of surgery medications reviewed with the patient.  Reproductive/Obstetrics                           Anesthesia Physical Anesthesia Plan  ASA: III  Anesthesia Plan: MAC   Post-op Pain Management:    Induction: Intravenous  Airway Management Planned: Nasal Cannula  Additional Equipment:   Intra-op Plan:   Post-operative Plan:   Informed Consent: I have reviewed the patients History and Physical, chart, labs and discussed the procedure including the risks, benefits and alternatives for the proposed anesthesia with the patient or authorized representative who has indicated his/her understanding and acceptance.   Dental advisory given  Plan Discussed with: CRNA and Anesthesiologist  Anesthesia Plan Comments: (Discussed risks/benefits/alternatives to MAC sedation including need for ventilatory support, hypotension, need for conversion to general anesthesia.  All patient questions answered.  Patient wished to proceed.)        Anesthesia Quick Evaluation

## 2015-10-19 NOTE — Progress Notes (Addendum)
TELEMETRY: Reviewed telemetry pt in Atrial fibrillation with controlled rate on IV diltiazem 15 mg/hr: Filed Vitals:   10/19/15 0341 10/19/15 0400 10/19/15 0500 10/19/15 0755  BP: 104/53 111/59 105/64 120/71  Pulse: 48 127 154 84  Temp: 97.8 F (36.6 C)   97.7 F (36.5 C)  TempSrc: Oral   Oral  Resp: _0 Height:      Weight:      SpO2: 93% 92% 93% 94%    Intake/Output Summary (Last 24 hours) at 10/19/15 0756 Last data filed at 10/19/15 0700  Gross per 24 hour  Intake 123.83 ml  Output   1640 ml  Net -1516.17 ml   Filed Weights   10/18/15 2350  Weight: 96.7 kg (213 lb 3 oz)    Subjective Feels better this am. Less SOB. No chest pain. No palpitations or dizziness. Thought she was just having bad asthma. Upset about being in hospital.   . doxazosin  4 mg Oral Daily  . enoxaparin (LOVENOX) injection  1 mg/kg Subcutaneous Q12H  . furosemide  40 mg Intravenous Q12H  . insulin aspart  0-24 Units Subcutaneous 6 times per day  . levothyroxine  50 mcg Oral QAC breakfast  . mometasone-formoterol  2 puff Inhalation BID  . oxybutynin  5 mg Oral Daily  . pravastatin  20 mg Oral q1800   . diltiazem (CARDIZEM) infusion 15 mg/hr (10/19/15 0403)    LABS: Basic Metabolic Panel:  Recent Labs  10/18/15 1800 10/19/15 0334  NA 137 140  K 4.4 3.9  CL 103 102  CO2 24 28  GLUCOSE 115* 149*  BUN 19 20  CREATININE 0.96 1.05*  CALCIUM 9.1 9.0  MG 2.0 1.8   Liver Function Tests:  Recent Labs  10/19/15 0334  AST 16  ALT 17  ALKPHOS 57  BILITOT 0.7  PROT 6.8  ALBUMIN 3.4*   No results for input(s): LIPASE, AMYLASE in the last 72 hours. CBC:  Recent Labs  10/18/15 1800 10/19/15 0334  WBC 9.2 7.1  HGB 10.0* 9.6*  HCT 32.2* 29.6*  MCV 85.2 85.3  PLT 269 253   Cardiac Enzymes: No results for input(s): CKTOTAL, CKMB, CKMBINDEX, TROPONINI in the last 72 hours. BNP: No results for input(s): PROBNP in the last 72 hours. D-Dimer: No results for input(s):  DDIMER in the last 72 hours. Hemoglobin A1C: No results for input(s): HGBA1C in the last 72 hours. Fasting Lipid Panel: No results for input(s): CHOL, HDL, LDLCALC, TRIG, CHOLHDL, LDLDIRECT in the last 72 hours. Thyroid Function Tests:  Recent Labs  10/18/15 2142  TSH 3.029     Radiology/Studies:  Dg Chest Portable 1 View  10/18/2015  CLINICAL DATA:  Shortness of breath for 3 days. Atrial fibrillation and tachycardia. EXAM: PORTABLE CHEST 1 VIEW COMPARISON:  PA and lateral chest 07/18/2015. FINDINGS: There is cardiomegaly and pulmonary vascular congestion. No consolidative process, pneumothorax or effusion is identified. Marked degenerative change about the shoulders appears worse on the right. Postoperative change left shoulder noted IMPRESSION: Cardiomegaly and pulmonary vascular congestion. Electronically Signed   By: Inge Rise M.D.   On: 10/18/2015 18:24   Ecg: atrial fibrillation. ST-T changes c/w inferolateral ischemia. ST-T changes were present in 2014. I have personally reviewed and interpreted this study.   PHYSICAL EXAM General: Well developed, obese, in no acute distress. Head: Normocephalic, atraumatic, sclera non-icteric, oropharynx is clear Neck: Negative for carotid bruits. JVD not elevated. No adenopathy Lungs: Minimal rales. No wheezing. Breathing  is unlabored. Heart: IRRR S1 S2 with gr 2/6 systolic murmur across precordium. Loudest RUSB. Abdomen: Soft, non-tender, non-distended with normoactive bowel sounds. No hepatomegaly. No rebound/guarding. No obvious abdominal masses. Msk:  Strength and tone appears normal for age. Extremities: No clubbing, cyanosis or edema.  Distal pedal pulses are 2+ and equal bilaterally. Neuro: Alert and oriented X 3. Moves all extremities spontaneously. Psych:  Responds to questions appropriately with a normal affect.  ASSESSMENT AND PLAN: 1. Atrial fibrillation with RVR. New onset. Duration is unclear. Associated with acute  CHF. Now rate controlled on IV cardizem. Anticoagulated with SQ Lovenox. [place] vasc score of 5. Will arrange TEE DCCV today. Will ask pharmacy to  see to consider oral anticoagulation. ? Transition to oral NOAC versus Coumadin with bridging lovenox.  Post cardioversion can switch Cardizem to oral dose- was on 360 mg CD at home.  2. Acute CHF. LV function is unknown. I/O negative 1.5 L since admission. Will continue IV lasix. Follow up Echo today. 3. Murmur. ? AS murmur. Echo pending.  4. DM type 2. Metformin on hold. SSI. 5. HTN controlled. Will resume lisinopril post CV. 6. Hyperlipidemia on statin.  7. Asthma 8. Obesity. 9. Hypothyroidism. On replacement.  Present on Admission:  . Atrial fibrillation with RVR ([place]) . Essential hypertension . Hypothyroidism . Hyperlipidemia . Murmur . Volume overload . Urinary incontinence . Atrial fibrillation ([place])  Signed, Elnathan Fulford [place], [place] 10/19/2015 7:56 AM

## 2015-10-20 DIAGNOSIS — I5031 Acute diastolic (congestive) heart failure: Secondary | ICD-10-CM

## 2015-10-20 DIAGNOSIS — I350 Nonrheumatic aortic (valve) stenosis: Secondary | ICD-10-CM

## 2015-10-20 LAB — BASIC METABOLIC PANEL
Anion gap: 10 (ref 5–15)
BUN: 23 mg/dL — ABNORMAL HIGH (ref 6–20)
CO2: 28 mmol/L (ref 22–32)
Calcium: 8.8 mg/dL — ABNORMAL LOW (ref 8.9–10.3)
Chloride: 99 mmol/L — ABNORMAL LOW (ref 101–111)
Creatinine, Ser: 1.23 mg/dL — ABNORMAL HIGH (ref 0.44–1.00)
GFR calc Af Amer: 48 mL/min — ABNORMAL LOW (ref >60)
GFR calc non Af Amer: 41 mL/min — ABNORMAL LOW (ref >60)
Glucose, Bld: 127 mg/dL — ABNORMAL HIGH (ref 65–99)
Potassium: 4.2 mmol/L (ref 3.5–5.1)
Sodium: 137 mmol/L (ref 135–145)

## 2015-10-20 LAB — GLUCOSE, CAPILLARY
Glucose-Capillary: 139 mg/dL — ABNORMAL HIGH (ref 65–99)
Glucose-Capillary: 127 mg/dL — ABNORMAL HIGH (ref 65–99)
Glucose-Capillary: 159 mg/dL — ABNORMAL HIGH (ref 65–99)
Glucose-Capillary: 119 mg/dL — ABNORMAL HIGH (ref 65–99)
Glucose-Capillary: 126 mg/dL — ABNORMAL HIGH (ref 65–99)

## 2015-10-20 MED ORDER — FUROSEMIDE 40 MG PO TABS
40.0000 mg | ORAL_TABLET | Freq: Every day | ORAL | Status: DC
  Administered 2015-10-21: 40 mg via ORAL
  Filled 2015-10-20: qty 1

## 2015-10-20 MED ORDER — POTASSIUM CHLORIDE CRYS ER 20 MEQ PO TBCR
40.0000 meq | EXTENDED_RELEASE_TABLET | Freq: Once | ORAL | Status: AC
  Administered 2015-10-20: 40 meq via ORAL
  Filled 2015-10-20: qty 2

## 2015-10-20 MED ORDER — RIVAROXABAN 20 MG PO TABS
20.0000 mg | ORAL_TABLET | Freq: Every day | ORAL | Status: DC
  Administered 2015-10-21: 20 mg via ORAL
  Filled 2015-10-20: qty 1

## 2015-10-20 MED ORDER — AMIODARONE HCL 200 MG PO TABS
400.0000 mg | ORAL_TABLET | Freq: Two times a day (BID) | ORAL | Status: DC
  Administered 2015-10-20 – 2015-10-21 (×3): 400 mg via ORAL
  Filled 2015-10-20 (×3): qty 2

## 2015-10-20 MED ORDER — MAGNESIUM SULFATE 2 GM/50ML IV SOLN
2.0000 g | Freq: Once | INTRAVENOUS | Status: AC
  Administered 2015-10-20: 2 g via INTRAVENOUS
  Filled 2015-10-20: qty 50

## 2015-10-20 NOTE — Progress Notes (Signed)
CARDIOLOGY PROGRESS NOTE  Subjective  S/p DC-CV yesterday. Required 3 shocks. Maintaining SR. TEE with moderate to severe AS. Denies dyspnea. Wants to go home.   Filed Vitals:   10/20/15 0419 10/20/15 0600 10/20/15 0637 10/20/15 0800  BP:  109/49  118/51  Pulse:  65  67  Temp: 97.8 F (36.6 C)   97.6 F (36.4 C)  TempSrc: Oral   Oral  Resp:  17  16  Height:      Weight:   96.208 kg (212 lb 1.6 oz)   SpO2:  95%  96%    Intake/Output Summary (Last 24 hours) at 10/20/15 1054 Last data filed at 10/20/15 0800  Gross per 24 hour  Intake 1006.25 ml  Output   1975 ml  Net -968.75 ml   Filed Weights   10/18/15 2350 10/20/15 0637  Weight: 96.7 kg (213 lb 3 oz) 96.208 kg (212 lb 1.6 oz)      . doxazosin  4 mg Oral Daily  . enoxaparin (LOVENOX) injection  1 mg/kg Subcutaneous Q12H  . furosemide  40 mg Intravenous Q12H  . glimepiride  2 mg Oral Q breakfast  . insulin aspart  0-24 Units Subcutaneous TID WC  . levothyroxine  50 mcg Oral QAC breakfast  . mometasone-formoterol  2 puff Inhalation BID  . oxybutynin  5 mg Oral Daily  . pravastatin  20 mg Oral q1800   . diltiazem (CARDIZEM) infusion 5 mg/hr (10/20/15 0800)    LABS: Basic Metabolic Panel:  Recent Labs  10/18/15 1800 10/19/15 0334  NA 137 140  K 4.4 3.9  CL 103 102  CO2 24 28  GLUCOSE 115* 149*  BUN 19 20  CREATININE 0.96 1.05*  CALCIUM 9.1 9.0  MG 2.0 1.8   Liver Function Tests:  Recent Labs  10/19/15 0334  AST 16  ALT 17  ALKPHOS 57  BILITOT 0.7  PROT 6.8  ALBUMIN 3.4*   No results for input(s): LIPASE, AMYLASE in the last 72 hours. CBC:  Recent Labs  10/18/15 1800 10/19/15 0334  WBC 9.2 7.1  HGB 10.0* 9.6*  HCT 32.2* 29.6*  MCV 85.2 85.3  PLT 269 253   Cardiac Enzymes: No results for input(s): CKTOTAL, CKMB, CKMBINDEX, TROPONINI in the last 72 hours. BNP: No results for input(s): PROBNP in the last 72 hours. D-Dimer: No results for input(s): DDIMER in the last 72  hours. Hemoglobin A1C: No results for input(s): HGBA1C in the last 72 hours. Fasting Lipid Panel: No results for input(s): CHOL, HDL, LDLCALC, TRIG, CHOLHDL, LDLDIRECT in the last 72 hours. Thyroid Function Tests:  Recent Labs  10/18/15 2142  TSH 3.029     Radiology/Studies:  Dg Chest Portable 1 View  10/18/2015  CLINICAL DATA:  Shortness of breath for 3 days. Atrial fibrillation and tachycardia. EXAM: PORTABLE CHEST 1 VIEW COMPARISON:  PA and lateral chest 07/18/2015. FINDINGS: There is cardiomegaly and pulmonary vascular congestion. No consolidative process, pneumothorax or effusion is identified. Marked degenerative change about the shoulders appears worse on the right. Postoperative change left shoulder noted IMPRESSION: Cardiomegaly and pulmonary vascular congestion. Electronically Signed   By: Inge Rise M.D.   On: 10/18/2015 18:24   TELEMETRY: Reviewed telemetry pt in NSR 70s  PHYSICAL EXAM General: Well developed, obese, in no acute distress. Head: Normocephalic, atraumatic, sclera non-icteric, oropharynx is clear Neck: + carotid bruits. JVD not elevated. No adenopathy Lungs: Clear No wheezing. Breathing is unlabored. Heart: RRR S1 S2  with 2/6 systolic murmur across precordium. Loudest RUSB. Abdomen: Obese. Soft, non-tender, non-distended with normoactive bowel sounds. No hepatomegaly. No rebound/guarding. No obvious abdominal masses. Msk:  Strength and tone appears normal for age. Extremities: No clubbing, cyanosis or edema.  Distal pedal pulses are 2+ and equal bilaterally. Neuro: Alert and oriented X 3. Moves all extremities spontaneously. Psych:  Responds to questions appropriately with a normal affect.  ASSESSMENT AND PLAN: 1. Atrial fibrillation with RVR.     -s/p DCCV on 12/23    -TEE reviewed personally. She has severe LAE with moderate to severe AS. I suspect likeli  2. Acute diastolic CHF. EF normal.  3. Moderate to severe AS 4. DM type 2. Metformin on  hold. SSI. 5. HTN controlled. Will resume lisinopril post CV. 6. Hyperlipidemia on statin.  7. Asthma 8. Obesity. 9. Hypothyroidism. On replacement.  Present on Admission:  . Atrial fibrillation with RVR ([place]) . Essential hypertension . Hypothyroidism . Hyperlipidemia . Murmur . Volume overload . Urinary incontinence . Atrial fibrillation ([place])  Hypomagnesemia  I reviewed TEE personally and discussed with Dr. Lovena Le (EP). She has moderate to severe AS with marked LAE. She required 3 shocks to cardiovert. I suspect whe will have a very high likelihood of having recurrent AF. We discussed options of starting Tikosyn or amiodarone versus a more watchful waiting approach. We have decided to try amiodarone. Will start 400 po bid. Continue Lovenox today. Switch to Apixaban 5 bid tomorrow am. Supp mag and K. Consider outpatient sleep study.   Can switch lasix to po. Was on lisinopril at home. Can resume as BP requires would prefer this over doxazosin.   Discussed need for possible AVR in future.   Hopefully can go home tomorrow.   Treasa School, MD 10/20/2015 10:54 AM

## 2015-10-20 NOTE — Progress Notes (Signed)
ANTICOAGULATION CONSULT NOTE - Follow Up Consult  Pharmacy Consult for Enoxaparin > rivaroxaban  Indication: atrial fibrillation  Allergies  Allergen Reactions  . Codeine Hives  . Vesicare [Solifenacin]     unknown    Patient Measurements: Height: 5' 3" (160 cm) Weight: 212 lb 1.6 oz (96.208 kg) IBW/kg (Calculated) : 52.4   Vital Signs: Temp: 97.6 F (36.4 C) (12/24 1204) Temp Source: Oral (12/24 1204) BP: 108/66 mmHg (12/24 1100) Pulse Rate: 67 (12/24 0800)  Labs:  Recent Labs  10/18/15 1800 10/19/15 0334  HGB 10.0* 9.6*  HCT 32.2* 29.6*  PLT 269 253  LABPROT  --  14.8  INR  --  1.14  CREATININE 0.96 1.05*    Estimated Creatinine Clearance: 49.5 mL/min (by C-G formula based on Cr of 1.05).   Medical History: Past Medical History  Diagnosis Date  . Allergy   . Arthritis   . Asthma   . Diabetes mellitus without complication ([place])   . Heart murmur   . Thyroid disease   . History of aortic valve disease   . Diarrhea   . Urge incontinence   . Lumbago   . Edema   . Cervicalgia   . Chronic airway obstruction, not elsewhere classified   . Pain in joint, lower leg   . Routine gynecological examination   . Scoliosis (and kyphoscoliosis), idiopathic   . Type II or unspecified type diabetes mellitus without mention of complication, uncontrolled   . Encounter for long-term (current) use of other medications   . Unspecified essential hypertension   . Other and unspecified hyperlipidemia   . Benign neoplasm of colon   . Unspecified urinary incontinence   . Carpal tunnel syndrome   . Unspecified hemorrhoids without mention of complication   . Diffuse cystic mastopathy   . Unspecified hypothyroidism   . Obesity, unspecified   . Cellulitis and abscess of finger, unspecified   . Other psoriasis   . Type II or unspecified type diabetes mellitus without mention of complication, uncontrolled   . Cerumen impaction   . Neck pain 05/23/2015  . Anemia, unspecified       Assessment: 79yof 3 days of exertional dyspnea, though when further questioned this has been presented to a milder degree for the preceding 3 weeks. She initially attributed this to her asthma +/- a URI, though she denied coughing worse than her baseline or fevers. She became concerned when her symptoms failed to improved with her rescue Albuterol. Found to be in new Afib - RVR rate now improved 80s on Diltiazem drip.  Plan TEE/DCCV Enoxaparin 65m/kg = 941m ordered on admit and q12 - dose ok for renal fx Cr 1, CrCl 5032min. Plan to continue enoxaparin  x48hr post DCCV (highest risk is 48hr post then diminishes) then change to oral anticoagulation after.   Rivaroxaban less expensive copay for insurance - will start 12/25 evening  Goal of Therapy:  Anti-Xa level 0.6-1 units/ml 4hrs after LMWH dose given Monitor platelets by anticoagulation protocol: Yes   Plan:  Enoxaparin 1mg78m = 95mg37mnow Then q12 until 48hr post DCCV Then rivaroxaban 20mg 29mening - start 12/25 pm Monitor s/s bleeding and renal fx  Marivel Mcclarty CBonnita Nasuti.D. CPP, BCPS Clinical Pharmacist [phone] 1:46 PM

## 2015-10-21 ENCOUNTER — Other Ambulatory Visit: Payer: Self-pay | Admitting: Physician Assistant

## 2015-10-21 DIAGNOSIS — I5031 Acute diastolic (congestive) heart failure: Secondary | ICD-10-CM

## 2015-10-21 LAB — GLUCOSE, CAPILLARY
Glucose-Capillary: 117 mg/dL — ABNORMAL HIGH (ref 65–99)
Glucose-Capillary: 98 mg/dL (ref 65–99)

## 2015-10-21 MED ORDER — RIVAROXABAN 20 MG PO TABS: 20.0000 mg | ORAL_TABLET | Freq: Every day | ORAL | Status: DC

## 2015-10-21 MED ORDER — FUROSEMIDE 40 MG PO TABS: 40.0000 mg | ORAL_TABLET | ORAL | Status: DC

## 2015-10-21 MED ORDER — DILTIAZEM HCL ER COATED BEADS 180 MG PO CP24
180.0000 mg | ORAL_CAPSULE | Freq: Every day | ORAL | Status: DC
  Administered 2015-10-21: 180 mg via ORAL
  Filled 2015-10-21: qty 1

## 2015-10-21 MED ORDER — AMIODARONE HCL 200 MG PO TABS: ORAL_TABLET | ORAL | Status: DC

## 2015-10-21 MED ORDER — DILTIAZEM HCL ER COATED BEADS 180 MG PO CP24: ORAL_CAPSULE | ORAL | Status: DC

## 2015-10-21 MED ORDER — DILTIAZEM HCL ER COATED BEADS 360 MG PO CP24: ORAL_CAPSULE | ORAL | Status: DC

## 2015-10-21 NOTE — Discharge Summary (Signed)
Discharge Summary   Patient ID: Patricia BARNER,  MRN: [id number], DOB/AGE: 02-Dec-1935 79 y.o.  Admit date: 10/18/2015 Discharge date: 10/21/2015  Primary Care Provider: Estill Dooms Primary Cardiologist: Dr. Martinique  Discharge Diagnoses Principal Problem:   Atrial fibrillation with rapid ventricular response ([place]) Active Problems:   Essential hypertension   DM type 2 (diabetes mellitus, type 2) (HCC)   Hypothyroidism   Hyperlipidemia   Urinary incontinence   Atrial fibrillation with RVR (HCC)   Murmur   Volume overload   Atrial fibrillation (HCC)   Acute congestive heart failure (HCC)   Allergies Allergies  Allergen Reactions  . Codeine Hives  . Vesicare [Solifenacin]     unknown    Procedures  Echocardiogram 10/19/2015 LV EF: 55% -  60%  ------------------------------------------------------------------- Indications:   Atrial fibrillation - 427.31.  ------------------------------------------------------------------- History:  PMH: Asthma. Dyspnea and murmur. Aortic valve disease. Risk factors: Hypertension. Diabetes mellitus. Dyslipidemia.  ------------------------------------------------------------------- Study Conclusions  - Left ventricle: The cavity size was normal. There was mild concentric hypertrophy. Systolic function was normal. The estimated ejection fraction was in the range of 55% to 60%. Wall motion was normal; there were no regional wall motion abnormalities. Doppler parameters are consistent with elevated mean left atrial filling pressure. - Aortic valve: There was moderate stenosis. Valve area (VTI): 1.21 cm^2. Valve area (Vmax): 1.18 cm^2. Valve area (Vmean): 1.19 cm^2. - Mitral valve: Calcified annulus. Mildly thickened leaflets . There was mild to moderate regurgitation directed centrally. Valve area by continuity equation (using LVOT flow): 1.85 cm^2. - Left atrium: The atrium was severely dilated. -  Right atrium: The atrium was mildly dilated. - Pulmonary arteries: Systolic pressure was mildly increased. PA peak pressure: 42 mm Hg (S).     TEE DCCV 10/19/2015 LV EF: 55% -  60%  ------------------------------------------------------------------- Indications:   Atrial fibrillation - 427.31.  ------------------------------------------------------------------- Study Conclusions  - Left ventricle: Systolic function was normal. The estimated ejection fraction was in the range of 55% to 60%. Wall motion was normal; there were no regional wall motion abnormalities. - Aortic valve: Cusp separation was reduced. - Descending aorta: The descending aorta had severe diffuse disease. - Mitral valve: No evidence of vegetation. There was mild regurgitation. - Left atrium: The atrium was severely dilated. There was mild spontaneous echo contrast (&quot;smoke&quot;) in the cavity. - Right atrium: The atrium was mildly dilated. - Atrial septum: There was a patent foramen ovale. - Tricuspid valve: No evidence of vegetation. - Pulmonic valve: No evidence of vegetation.  Impressions:  - Normal LV function; biatrial enlargement; no LAA thrombus; calcified aortic valve with reduced cusp excursion; mild MR and TR; patent foramen ovale noted.  See full TEE report in [employer]; patient sedated by anesthesia with diprovan 120 mg IV total; normal LV function; biatrial enlargement; no LAA thrombus; patient subsequently had DCCV with 120J and 150J unsuccessfully; third attempt with 200 J resulted in NSR; no immediate complications; continue anticoagulation for 4 weeks post procedure uninterrupted.     Hospital Course  79 yo female with PMH of HTN, HLD, DM, hypothyroidism, anemia and obesity came to [facility] on 10/18/2015 with 3 day onset of exertional dyspnea. On further questioning, this has been present to a moderate degree for the preceding 3 weeks. She initially  attributed to asthma with possible URI, however her symptoms did not improve with albuterol. She also has some mild chest discomfort. She was seen by her PCP in November who noted a new systolic murmur suspicious  for AS. Outpatient echo was ordered however was not completed. On arrival to [facility], she was noted to be in atrial fibrillation with RVR. Chest x-ray showed cardiomegaly and pulmonary edema which is new when compared to the previous chest x-ray. BNP was elevated at 436. TSH 3.15. Hemoglobin 10.0. She was given a dose of IV Lasix. She was admitted to cardiology service and started on IV diltiazem. 2-D echo was obtained on 10/19/2015 which showed EF 55-60%, moderate AS, mild-to-moderate MR, PA peak pressure 42. She underwent TEE cardioversion on 12/23 no left atrial thrombus was seen. She subsequently was shocked at 120 J and 150 J unsuccessfully, the third attempt with 200 J resulted in normal sinus rhythm. TEE showed moderate to severe AS with marked left atrial enlargement. Given the very high likelihood of having recurrent atrial fibrillation, it was decided to start on anti-rhythmic therapy with amiodarone at 400 mg twice a day. The co-pay for eliquis was $110 which was too expensive, therefore was switched to Xarelto. She may need AVR in the future.  She will continue 400 mg twice a day for 2 weeks, then switched to 200 mg twice a day for 4 weeks then 200 mg daily afterward. She will be on PO Lasix 40 mg on M/W/F and as needed. She will need a basic metabolic panel in one week to check renal function. After discussing with Dr. Haroldine Laws, since she is being loaded with amio, we will discharge her on 168m Cardizem CD to prevent bradycardia.  At some point and will need to consider whether or not to obtain outpatient sleep study.   Discharge Vitals Blood pressure 143/65, pulse 76, temperature 97.9 F (36.6 C), temperature source Oral, resp. rate 16, height _0  (1.6 m), weight 210 lb  11.2 oz (95.573 kg), SpO2 99 %.  Filed Weights   10/18/15 2350 10/20/15 0637 10/21/15 0628  Weight: 213 lb 3 oz (96.7 kg) 212 lb 1.6 oz (96.208 kg) 210 lb 11.2 oz (95.573 kg)    Labs  CBC  Recent Labs  10/18/15 1800 10/19/15 0334  WBC 9.2 7.1  HGB 10.0* 9.6*  HCT 32.2* 29.6*  MCV 85.2 85.3  PLT 269 2884  Basic Metabolic Panel  Recent Labs  10/18/15 1800 10/19/15 0334 10/20/15 1539  NA 137 140 137  K 4.4 3.9 4.2  CL 103 102 99*  CO2 _1 GLUCOSE 115* 149* 127*  BUN 19 20 23*  CREATININE 0.96 1.05* 1.23*  CALCIUM 9.1 9.0 8.8*  MG 2.0 1.8  --    Liver Function Tests  Recent Labs  10/19/15 0334  AST 16  ALT 17  ALKPHOS 57  BILITOT 0.7  PROT 6.8  ALBUMIN 3.4*   Thyroid Function Tests  Recent Labs  10/18/15 2142  TSH 3.029    Disposition  Pt is being discharged home today in good condition.  Follow-up Plans & Appointments      Follow-up Information    Follow up with [name] JMartinique MD.   Specialty:  Cardiology   Why:  Office will contact you to arrange followup, please give uKoreaa call if you do not hear from uKoreain 2 business days.    Contact information:   [phone]      [facility].   Specialty:  Cardiology   Why:  Obtain BMET on 10/26/2015 to check kidney function.  Contact information:   [address] [place] [phone]      Schedule an appointment as soon as possible for a visit with GREEN, Viviann Spare, MD.   Specialty:  Internal Medicine   Why:  Followup with your primary care physician as soon as possible.   Contact information:   [place] [address] [phone]       Discharge Medications    Medication List    STOP taking these medications        aspirin 81 MG tablet      TAKE these medications        albuterol 108 (90 BASE) MCG/ACT inhaler  Commonly known  as:  PROVENTIL HFA;VENTOLIN HFA  Inhale 2 puffs into the lungs every 6 (six) hours as needed for wheezing or shortness of breath.     amiodarone 200 MG tablet  Commonly known as:  PACERONE  Take 442m (2 tablets) twice a day for 2 weeks, then 2032m(1 tablet) twice a day for 4 weeks then 20079maily afterard     cetirizine 10 MG tablet  Commonly known as:  ZYRTEC  Take 10 mg by mouth daily.     COMFORT LANCETS Misc  Use daily to get blood for glucometer     diltiazem 180 MG 24 hr capsule  Commonly known as:  CARDIZEM CD  Take 1 capsule by mouth  once a day to control blood pressure     doxazosin 4 MG tablet  Commonly known as:  CARDURA  One daily to help control BP     Fluticasone-Salmeterol 100-50 MCG/DOSE Aepb  Commonly known as:  ADVAIR DISKUS  Inhale one puff twice daily for breathing     furosemide 40 MG tablet  Commonly known as:  LASIX  Take 1 tablet (40 mg total) by mouth See admin instructions. Take 1 tablet on Monday/Wednesday and Friday. May take additional dose as needed for increased swelling or weight gain >3 lbs in 24 hr     glimepiride 2 MG tablet  Commonly known as:  AMARYL  TAKE 1 TABLET BY MOUTH AT  BREAKFAST TO CONTROL  GLUCOSE     glucose blood test strip  Commonly known as:  BAYER CONTOUR TEST  Use daily too check glucose     levothyroxine 50 MCG tablet  Commonly known as:  SYNTHROID, LEVOTHROID  Take 1 tablet by mouth  daily for thyroid  supplement     lisinopril 20 MG tablet  Commonly known as:  PRINIVIL,ZESTRIL  Take one tablet by mouth once daily for blood pressure     lovastatin 40 MG tablet  Commonly known as:  MEVACOR  Take 40 mg by mouth at bedtime.     metFORMIN 500 MG tablet  Commonly known as:  GLUCOPHAGE  Take 500 mg by mouth 2 (two) times daily with a meal.     oxybutynin 5 MG tablet  Commonly known as:  DITROPAN  One twice daily to help control bladder     Potassium 95 MG Tabs  Take 1 tablet by mouth daily.      rivaroxaban 20 MG Tabs tablet  Commonly known as:  XARELTO  Take 1 tablet (20 mg total) by mouth daily with supper.        Outstanding Labs/Studies  BMET on 10/26/2015  Duration of Discharge Encounter   Greater than 30 minutes including physician time.  SigHilbert Corrigan-C Pager: [phone]/25/2016, 3:14 PM  Patient seen and examined with Almyra Deforest, PA-C. We discussed all aspects of the encounter. I agree with the assessment and plan as stated above.  She is maintaining NSR after DC-CV. Continue amio and Xarelto. F/u with Dr. Radford Pax.   Desmen Schoffstall,MD 4:14 PM

## 2015-10-21 NOTE — Progress Notes (Signed)
Discussed discharge instructions with the patient, daughter at bedside. Patient educated on follow up appointments, new medications, and care after discharge. Patient verbalized understanding of discharge instructions. Dose of Cardizem and Xarelto given prior to discharge. Patient given paper prescriptions. IV discontinued, patient taken off cardiac monitor. Patient discharged to home.    Elmarie Shiley R

## 2015-10-21 NOTE — Progress Notes (Signed)
CARDIOLOGY PROGRESS NOTE  Subjective  S/p DC-CV on 12/23. Required 3 shocks. Amio started yesterday to help maintain NSR given large LA.  Maintaining SR.   TEE with moderate to severe AS.   Switched from lovenox to Xarelto today (Dr. Martinique ordered Eliquis but copay was $110/month so switched to Xarelto).   Feels good. Wants to go home.   Filed Vitals:   10/21/15 0406 10/21/15 0628 10/21/15 0730 10/21/15 1142  BP:   149/71 143/65  Pulse:   81 76  Temp: 98.2 F (36.8 C)  97.9 F (36.6 C) 97.9 F (36.6 C)  TempSrc: Oral  Oral Oral  Resp:   16 16  Height:      Weight:  95.573 kg (210 lb 11.2 oz)    SpO2:   93% 99%    Intake/Output Summary (Last 24 hours) at 10/21/15 1200 Last data filed at 10/21/15 1100  Gross per 24 hour  Intake    240 ml  Output   2100 ml  Net  -1860 ml   Filed Weights   10/18/15 2350 10/20/15 0637 10/21/15 0628  Weight: 96.7 kg (213 lb 3 oz) 96.208 kg (212 lb 1.6 oz) 95.573 kg (210 lb 11.2 oz)      . amiodarone  400 mg Oral BID  . doxazosin  4 mg Oral Daily  . furosemide  40 mg Oral Daily  . glimepiride  2 mg Oral Q breakfast  . insulin aspart  0-24 Units Subcutaneous TID WC  . levothyroxine  50 mcg Oral QAC breakfast  . mometasone-formoterol  2 puff Inhalation BID  . oxybutynin  5 mg Oral Daily  . pravastatin  20 mg Oral q1800  . rivaroxaban  20 mg Oral Q supper      LABS: Basic Metabolic Panel:  Recent Labs  10/18/15 1800 10/19/15 0334 10/20/15 1539  NA 137 140 137  K 4.4 3.9 4.2  CL 103 102 99*  CO2 _0 GLUCOSE 115* 149* 127*  BUN 19 20 23*  CREATININE 0.96 1.05* 1.23*  CALCIUM 9.1 9.0 8.8*  MG 2.0 1.8  --    Liver Function Tests:  Recent Labs  10/19/15 0334  AST 16  ALT 17  ALKPHOS 57  BILITOT 0.7  PROT 6.8  ALBUMIN 3.4*   No results for input(s): LIPASE, AMYLASE in the last 72 hours. CBC:  Recent Labs  10/18/15 1800 10/19/15 0334  WBC 9.2 7.1  HGB 10.0* 9.6*  HCT 32.2* 29.6*  MCV 85.2  85.3  PLT 269 253   Cardiac Enzymes: No results for input(s): CKTOTAL, CKMB, CKMBINDEX, TROPONINI in the last 72 hours. BNP: No results for input(s): PROBNP in the last 72 hours. D-Dimer: No results for input(s): DDIMER in the last 72 hours. Hemoglobin A1C: No results for input(s): HGBA1C in the last 72 hours. Fasting Lipid Panel: No results for input(s): CHOL, HDL, LDLCALC, TRIG, CHOLHDL, LDLDIRECT in the last 72 hours. Thyroid Function Tests:  Recent Labs  10/18/15 2142  TSH 3.029     Radiology/Studies:  No results found. TELEMETRY: Reviewed telemetry pt in NSR 70s  PHYSICAL EXAM General: Well developed, obese, in no acute distress. Head: Normocephalic, atraumatic, sclera non-icteric, oropharynx is clear Neck: + carotid bruits. JVD not elevated. No adenopathy Lungs: Clear No wheezing. Breathing is unlabored. Heart: RRR S1 S2 with 2/6 systolic murmur across precordium. Loudest RUSB. Abdomen: Obese. Soft, non-tender, non-distended with normoactive bowel sounds. No hepatomegaly. No rebound/guarding.  No obvious abdominal masses. Msk:  Strength and tone appears normal for age. Extremities: No clubbing, cyanosis or edema.  Distal pedal pulses are 2+ and equal bilaterally. Neuro: Alert and oriented X 3. Moves all extremities spontaneously. Psych:  Responds to questions appropriately with a normal affect.  ASSESSMENT AND PLAN: 1. Atrial fibrillation with RVR.     -s/p DCCV on 12/23    -TEE reviewed personally. She has severe LAE with moderate to severe AS. I suspect likeli  2. Acute diastolic CHF. EF normal.  3. Moderate to severe AS 4. DM type 2. Metformin on hold. SSI. 5. HTN controlled. Will resume lisinopril post CV. 6. Hyperlipidemia on statin.  7. Asthma 8. Obesity. 9. Hypothyroidism. On replacement.  Present on Admission:  . Atrial fibrillation with RVR ([place]) . Essential hypertension . Hypothyroidism . Hyperlipidemia . Murmur . Volume overload . Urinary  incontinence . Atrial fibrillation ([place])  Hypomagnesemia  I reviewed TEE personally and discussed with Dr. Lovena Le (EP) yesterday. She has moderate to severe AS with marked LAE. She required 3 shocks to cardiovert. I suspect whe will have a very high likelihood of having recurrent AF. We discussed options of starting Tikosyn or amiodarone versus a more watchful waiting approach. We have decided to start amiodarone. Will start 400 po bid x 2 weeks then switch to 200 bid for 4 weeks. Then 200 daily. Now on Xarelto. Volume status stable on po lasix (stressed need to take it at home because she was often skipping it). Would use lasix 13m on M/W/F and as needed. Will need bmet 1 week. Consider outpatient sleep study.   Was on lisinopril at home. Would resume.   Discussed need for possible AVR in future.   Home today  Signed, BGlori Bickers MD 10/21/2015 12:00 PM

## 2015-10-21 NOTE — Discharge Instructions (Signed)
Information on my medicine - XARELTO (Rivaroxaban)  This medication education was reviewed with me or my healthcare representative as part of my discharge preparation.  Why was Xarelto prescribed for you? Xarelto was prescribed for you to reduce the risk of a blood clot forming that can cause a stroke if you have a medical condition called atrial fibrillation (a type of irregular heartbeat).  What do you need to know about xarelto ? Take your Xarelto ONCE DAILY at the same time every day with your evening meal. If you have difficulty swallowing the tablet whole, you may crush it and mix in applesauce just prior to taking your dose.  Take Xarelto exactly as prescribed by your doctor and DO NOT stop taking Xarelto without talking to the doctor who prescribed the medication.  Stopping without other stroke prevention medication to take the place of Xarelto may increase your risk of developing a clot that causes a stroke.  Refill your prescription before you run out.  After discharge, you should have regular check-up appointments with your healthcare provider that is prescribing your Xarelto.  In the future your dose may need to be changed if your kidney function or weight changes by a significant amount.  What do you do if you miss a dose? If you are taking Xarelto ONCE DAILY and you miss a dose, take it as soon as you remember on the same day then continue your regularly scheduled once daily regimen the next day. Do not take two doses of Xarelto at the same time or on the same day.   Important Safety Information A possible side effect of Xarelto is bleeding. You should call your healthcare provider right away if you experience any of the following: ? Bleeding from an injury or your nose that does not stop. ? Unusual colored urine (red or dark brown) or unusual colored stools (red or black). ? Unusual bruising for unknown reasons. ? A serious fall or if you hit your head (even if there  is no bleeding).  Some medicines may interact with Xarelto and might increase your risk of bleeding while on Xarelto. To help avoid this, consult your healthcare provider or pharmacist prior to using any new prescription or non-prescription medications, including herbals, vitamins, non-steroidal anti-inflammatory drugs (NSAIDs) and supplements.  This website has more information on Xarelto: [email or web address]-benson.[email or web address]   Atrial Fibrillation Atrial fibrillation is a type of heartbeat that is irregular or fast (rapid). If you have this condition, your heart keeps quivering in a weird (chaotic) way. This condition can make it so your heart cannot pump blood normally. Having this condition gives a person more risk for stroke, heart failure, and other heart problems. There are different types of atrial fibrillation. Talk with your doctor to learn about the type that you have. HOME CARE  Take over-the-counter and prescription medicines only as told by your doctor.  If your doctor prescribed a blood-thinning medicine, take it exactly as told. Taking too much of it can cause bleeding. If you do not take enough of it, you will not have the protection that you need against stroke and other problems.  Do not use any tobacco products. These include cigarettes, chewing tobacco, and e-cigarettes. If you need help quitting, ask your doctor.  If you have apnea (obstructive sleep apnea), manage it as told by your doctor.  Do not drink alcohol.  Do not drink beverages that have caffeine. These include coffee, soda, and tea.  Maintain a healthy weight. Do not use  diet pills unless your doctor says they are safe for you. Diet pills may make heart problems worse.  Follow diet instructions as told by your doctor.  Exercise regularly as told by your doctor.  Keep all follow-up visits as told by your doctor. This is important. GET HELP IF:  You notice a change in the speed, rhythm, or strength of your  heartbeat.  You are taking a blood-thinning medicine and you notice more bruising.  You get tired more easily when you move or exercise. GET HELP RIGHT AWAY IF:  You have pain in your chest or your belly (abdomen).  You have sweating or weakness.  You feel sick to your stomach (nauseous).  You notice blood in your throw up (vomit), poop (stool), or pee (urine).  You are short of breath.  You suddenly have swollen feet and ankles.  You feel dizzy.  Your suddenly get weak or numb in your face, arms, or legs, especially if it happens on one side of your body.  You have trouble talking, trouble understanding, or both.  Your face or your eyelid droops on one side. These symptoms may be an emergency. Do not wait to see if the symptoms will go away. Get medical help right away. Call your local emergency services (911 in the [place] Do not drive yourself to the hospital.   This information is not intended to replace advice given to you by your health care provider. Make sure you discuss any questions you have with your health care provider.   Document Released: 07/22/2008 Document Revised: 07/04/2015 Document Reviewed: 02/07/2015 Elsevier Interactive Patient Education [employer].

## 2015-10-22 ENCOUNTER — Encounter (HOSPITAL_COMMUNITY): Payer: Self-pay | Admitting: Cardiology

## 2015-10-26 ENCOUNTER — Other Ambulatory Visit: Payer: Self-pay | Admitting: Cardiology

## 2015-10-26 ENCOUNTER — Telehealth: Payer: Self-pay | Admitting: Cardiology

## 2015-10-26 DIAGNOSIS — I5031 Acute diastolic (congestive) heart failure: Secondary | ICD-10-CM

## 2015-10-26 LAB — BASIC METABOLIC PANEL
BUN: 23 mg/dL (ref 7–25)
CO2: 27 mmol/L (ref 20–31)
Calcium: 10.3 mg/dL (ref 8.6–10.4)
Chloride: 101 mmol/L (ref 98–110)
Creat: 1.06 mg/dL — ABNORMAL HIGH (ref 0.60–0.93)
Glucose, Bld: 119 mg/dL — ABNORMAL HIGH (ref 65–99)
Potassium: 4.4 mmol/L (ref 3.5–5.3)
Sodium: 134 mmol/L — ABNORMAL LOW (ref 135–146)

## 2015-10-26 NOTE — Telephone Encounter (Signed)
Pt in lab, told to come in and get blood work. Pt d/c from hosptial on 12/25 and needed BMET orders released.  Lab req. Faxed to lab as they unable to see No additional questions at this time.

## 2015-10-30 ENCOUNTER — Telehealth: Payer: Self-pay | Admitting: Cardiology

## 2015-10-30 NOTE — Telephone Encounter (Signed)
Returned call to patient.She stated she has a headache and wanted to know what pain med she can take.Advised ok to take tylenol as needed.

## 2015-10-30 NOTE — Telephone Encounter (Signed)
Pt wants to know what can she take for pain?

## 2015-11-04 ENCOUNTER — Other Ambulatory Visit: Payer: Self-pay | Admitting: Internal Medicine

## 2015-11-06 ENCOUNTER — Other Ambulatory Visit: Payer: Self-pay | Admitting: *Deleted

## 2015-11-06 ENCOUNTER — Telehealth: Payer: Self-pay | Admitting: *Deleted

## 2015-11-06 DIAGNOSIS — I10 Essential (primary) hypertension: Secondary | ICD-10-CM

## 2015-11-06 MED ORDER — DOXAZOSIN MESYLATE 4 MG PO TABS: ORAL_TABLET | ORAL | Status: DC

## 2015-11-06 NOTE — Telephone Encounter (Signed)
Cindy with [facility] called and left message on voicemail stating there was concerns with Cardura 44m and to call back [phone] speak with pharmacist.  SDamaris Schoonerwith POlin Hauserconfirmed refill for Cardura 474m

## 2015-11-06 NOTE — Telephone Encounter (Signed)
Patient requested to be sent to [facility].

## 2015-11-16 ENCOUNTER — Other Ambulatory Visit: Payer: Self-pay | Admitting: Internal Medicine

## 2015-11-19 ENCOUNTER — Other Ambulatory Visit: Payer: Self-pay

## 2015-11-19 ENCOUNTER — Telehealth: Payer: Self-pay | Admitting: Cardiology

## 2015-11-19 ENCOUNTER — Telehealth: Payer: Self-pay

## 2015-11-19 MED ORDER — RIVAROXABAN 20 MG PO TABS: 20.0000 mg | ORAL_TABLET | Freq: Every day | ORAL | Status: DC

## 2015-11-19 NOTE — Telephone Encounter (Signed)
Requested medication is not on med list. I called patient, patient stated that she is pravastatin (confirmed by checking pill bottles). Please advise if ok to add pravastatin back on medication list and ok to remove lovastatin.

## 2015-11-19 NOTE — Telephone Encounter (Signed)
Pt called in needing a prior authorization for her  Xarelto medication. This needs to be called in to the [facility] on [address] and [address].  Thanks

## 2015-11-19 NOTE — Telephone Encounter (Signed)
[facility] called obtained prior authorization for xarelto.Prescription sent pharmacy.

## 2015-11-19 NOTE — Telephone Encounter (Signed)
Prior auth for Xarelto 20mg sent to [facility]. 

## 2015-11-20 ENCOUNTER — Encounter: Payer: Self-pay | Admitting: Physician Assistant

## 2015-11-20 ENCOUNTER — Ambulatory Visit (INDEPENDENT_AMBULATORY_CARE_PROVIDER_SITE_OTHER): Payer: Medicare Other | Admitting: Physician Assistant

## 2015-11-20 ENCOUNTER — Telehealth: Payer: Self-pay

## 2015-11-20 VITALS — BP 124/52 | HR 70 | Ht 63.0 in | Wt 206.0 lb

## 2015-11-20 DIAGNOSIS — I350 Nonrheumatic aortic (valve) stenosis: Secondary | ICD-10-CM | POA: Diagnosis not present

## 2015-11-20 DIAGNOSIS — Z79899 Other long term (current) drug therapy: Secondary | ICD-10-CM | POA: Diagnosis not present

## 2015-11-20 DIAGNOSIS — Z7901 Long term (current) use of anticoagulants: Secondary | ICD-10-CM

## 2015-11-20 DIAGNOSIS — I10 Essential (primary) hypertension: Secondary | ICD-10-CM

## 2015-11-20 DIAGNOSIS — I4891 Unspecified atrial fibrillation: Secondary | ICD-10-CM | POA: Diagnosis not present

## 2015-11-20 LAB — BASIC METABOLIC PANEL
BUN: 45 mg/dL — ABNORMAL HIGH (ref 7–25)
CO2: 28 mmol/L (ref 20–31)
Calcium: 9.8 mg/dL (ref 8.6–10.4)
Chloride: 97 mmol/L — ABNORMAL LOW (ref 98–110)
Creat: 1.47 mg/dL — ABNORMAL HIGH (ref 0.60–0.93)
Glucose, Bld: 103 mg/dL — ABNORMAL HIGH (ref 65–99)
Potassium: 4.9 mmol/L (ref 3.5–5.3)
Sodium: 136 mmol/L (ref 135–146)

## 2015-11-20 MED ORDER — DILTIAZEM HCL ER COATED BEADS 180 MG PO CP24: ORAL_CAPSULE | ORAL | Status: DC

## 2015-11-20 MED ORDER — AMIODARONE HCL 200 MG PO TABS: ORAL_TABLET | ORAL | Status: DC

## 2015-11-20 NOTE — Patient Instructions (Signed)
Your physician recommends that you return for lab work in: today at [facility] on the first floor  Your physician recommends that you schedule a follow-up appointment in: 2 months with Dr. Martinique

## 2015-11-20 NOTE — Telephone Encounter (Signed)
It is okay to stop lovastatin and start pravastatin 20 mg daily as she is currently taking it.

## 2015-11-20 NOTE — Progress Notes (Signed)
Cardiology Office Note   Date:  11/20/2015   ID:  GENIYAH [name], DOB 1936-07-19, MRN [id number]  PCP:  Estill Dooms, MD  Cardiologist:  Dr Martinique  Sherrel Shafer, PA-C   Chief Complaint  Patient presents with  . 2-4 weeks post hospital    Patient has no complaints.    History of Present Illness: Patricia [name] is a 80 y.o. female with a history of HTN, HL, anemia, hypothyroid and obesity.  Admitted 12/22-12/25 with Atrial fib, RVR s/p TEE/DCCV on Xarelto and amiodarone. D-CHF and mod-severe AS by echo.   Patricia [name] presents for post hospital follow-up.  Since discharge from the hospital, she feels like she has been doing well. She had one episode right after she got out of the shower a few days ago where she was tired. This reminded her of her preadmission symptoms. She laid down and rested and the symptoms resolved. She has no palpitations and never had any awareness that she was in an abnormal rhythm.  Her dyspnea on exertion has improved, she's had no lower extremity edema, and no orthopnea or PND. She is walking up the steps to her house without any difficulty. She is a little short of breath at the top, but not very bad. These are 7-8 steps, not quite a full floor. She has been increasing her activity as tolerated. The major limitation to her exertion level has always been shortness of breath because of a history of asthma. She has not had any wheezing. She has been weighing herself daily and has not had to take extra Lasix until today.  The major concern today is her right shoulder. She has had problems with it for a while and at one point was told she would need surgery. She never did schedule the surgery but now the pain is worse and she is only able to sleep 2 hours at a time. She will wake up because of the pain and have to sit up until it eases so she can go back to sleep. She is thinking about contacting the surgeon and wants to know if she would be able to  schedule the surgery.   Past Medical History  Diagnosis Date  . Allergy   . Arthritis   . Asthma   . Diabetes mellitus without complication ([place])   . Severe aortic stenosis   . Diarrhea   . Urge incontinence   . Lumbago   . Edema   . Cervicalgia   . Chronic airway obstruction, not elsewhere classified   . Pain in joint, lower leg   . Routine gynecological examination   . Scoliosis (and kyphoscoliosis), idiopathic   . Encounter for long-term (current) use of other medications   . Unspecified essential hypertension   . Other and unspecified hyperlipidemia   . Benign neoplasm of colon   . Carpal tunnel syndrome   . Unspecified hemorrhoids without mention of complication   . Diffuse cystic mastopathy   . Unspecified hypothyroidism   . Obesity, unspecified   . Cellulitis and abscess of finger, unspecified   . Other psoriasis   . Type II or unspecified type diabetes mellitus without mention of complication, uncontrolled   . Cerumen impaction   . Neck pain 05/23/2015  . Anemia, unspecified   . Atrial fibrillation status post cardioversion [facility]) 09/2015    s/p TEE/DCCV>>SR on amio  . Chronic anticoagulation 09/2015    Xarelto for afib, CHADS2VASC=5    Past Surgical  History  Procedure Laterality Date  . Breast surgery      right breast 3 surgeries [phone]  . Cholecystectomy  1992    DR BOWMAN  . Colon surgery      2004,2007,2013.  3 times colon surgieres  . Abdominal hysterectomy  1987    complete  . Vaginal cyst removed  1967  . 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 Foot surgery  1989  . Excise le mandibular lymph node t      DR C. NEWMAN  . Left shoulder arthroscopy    . Neuroplasty / transposition median nerve at carpal tunnel bilateral  05/2003  . Tee without cardioversion N/A 10/19/2015    Procedure: Transesophageal Echocardiogram (TEE) ;  Surgeon: Lelon Perla, MD;  Location: [facility] ENDOSCOPY;  Service: Cardiovascular;  Laterality: N/A;  . Cardioversion N/A  10/19/2015    Procedure: CARDIOVERSION;  Surgeon: Lelon Perla, MD;  Location: [facility] ENDOSCOPY;  Service: Cardiovascular;  Laterality: N/A;    Current Outpatient Prescriptions  Medication Sig Dispense Refill  . ADVAIR DISKUS 100-50 MCG/DOSE AEPB Inhale 1 puff two times  daily for breathing 60 each 2  . albuterol (PROVENTIL HFA;VENTOLIN HFA) 108 (90 BASE) MCG/ACT inhaler Inhale 2 puffs into the lungs every 6 (six) hours as needed for wheezing or shortness of breath. 1 Inhaler 0  . amiodarone (PACERONE) 200 MG tablet Take 471m (2 tablets) twice a day for 2 weeks, then 2061m(1 tablet) twice a day for 4 weeks then 20073maily afterard 60 tablet 5  . cetirizine (ZYRTEC) 10 MG tablet Take 10 mg by mouth daily.    . COMFORT LANCETS MISC Use daily to get blood for glucometer 100 each 3  . diltiazem (CARDIZEM CD) 180 MG 24 hr capsule Take 1 capsule by mouth  once a day to control blood pressure 90 capsule 3  . doxazosin (CARDURA) 4 MG tablet Take one tablet by mouth once daily to help control blood pressure 90 tablet 3  . furosemide (LASIX) 40 MG tablet Take 1 tablet (40 mg total) by mouth See admin instructions. Take 1 tablet on Monday/Wednesday and Friday. May take additional dose as needed for increased swelling or weight gain >3 lbs in 24 hr 90 tablet 3  . glimepiride (AMARYL) 2 MG tablet TAKE 1 TABLET BY MOUTH AT  BREAKFAST TO CONTROL  GLUCOSE 90 tablet 1  . glucose blood (BAYER CONTOUR TEST) test strip Use daily too check glucose 100 each 4  . levothyroxine (SYNTHROID, LEVOTHROID) 50 MCG tablet Take 1 tablet by mouth  daily for thyroid  supplement 90 tablet 3  . lisinopril (PRINIVIL,ZESTRIL) 20 MG tablet Take one tablet by mouth once daily for blood pressure 90 tablet 3  . metFORMIN (GLUCOPHAGE) 500 MG tablet Take 500 mg by mouth 2 (two) times daily with a meal.    . oxybutynin (DITROPAN) 5 MG tablet One twice daily to help control bladder (Patient taking differently: Take 5 mg by mouth daily. One  twice daily to help control bladder) 180 tablet 2  . Potassium 95 MG TABS Take 1 tablet by mouth daily.     . pravastatin (PRAVACHOL) 20 MG tablet Take 1 tablet by mouth  daily 90 tablet 3  . rivaroxaban (XARELTO) 20 MG TABS tablet Take 1 tablet (20 mg total) by mouth daily with supper. 30 tablet 6   No current facility-administered medications for this visit.    Allergies:   Codeine and Vesicare    Social History:  The patient  reports that she quit smoking about 26 years ago. She has never used smokeless tobacco. She reports that she does not drink alcohol or use illicit drugs.   Family History:  The patient's family history includes Arthritis in her sister; Asthma in her sister; Cancer in her sister; Diabetes in her father; Heart disease in her brother and father; Stroke in her father.    ROS:  Please see the history of present illness. All other systems are reviewed and negative.    PHYSICAL EXAM: VS:  BP 124/52 mmHg  Pulse 70  Ht _0  (1.6 m)  Wt 206 lb (93.441 kg)  BMI 36.50 kg/m2 , BMI Body mass index is 36.5 kg/(m^2). GEN: Well nourished, well developed, female in no acute distress HEENT: normal for age  Neck: no JVD, radiation of murmur to both carotids, no masses Cardiac: RRR; 3/6 classic aortic stenosis murmur, no rubs, or gallops Respiratory:  clear to auscultation bilaterally, normal work of breathing GI: soft, nontender, nondistended, + BS MS: no deformity or atrophy; no edema; distal pulses are 2+ in all 4 extremities  Skin: warm and dry, no rash Neuro:  Strength and sensation are intact Psych: euthymic mood, full affect   EKG:  EKG is ordered today. The ekg ordered today demonstrates sinus rhythm, rate 70, no acute ischemic changes   ECHO: 10/19/2015 - Left ventricle: The cavity size was normal. There was mild concentric hypertrophy. Systolic function was normal. The estimated ejection fraction was in the range of 55% to 60%. Wall motion was normal;  there were no regional wall motion abnormalities. Doppler parameters are consistent with elevated mean left atrial filling pressure. - Aortic valve: There was moderate stenosis. Valve area (VTI): 1.21 cm^2. Valve area (Vmax): 1.18 cm^2. Valve area (Vmean): 1.19 cm^2. Mean gradient (S): 31 mm Hg. Peak gradient (S): 56 mm Hg. - Mitral valve: Calcified annulus. Mildly thickened leaflets . There was mild to moderate regurgitation directed centrally. Valve area by continuity equation (using LVOT flow): 1.85 cm^2. - Left atrium: The atrium was severely dilated. - Right atrium: The atrium was mildly dilated. - Pulmonary arteries: Systolic pressure was mildly increased. PA peak pressure: 42 mm Hg (S).  Recent Labs: 10/18/2015: B Natriuretic Peptide 436.0*; TSH 3.029 10/19/2015: ALT 17; Hemoglobin 9.6*; Magnesium 1.8; Platelets 253 10/26/2015: BUN 23; Creat 1.06*; Potassium 4.4; Sodium 134*    Lipid Panel    Component Value Date/Time   CHOL 165 09/24/2015 0916   TRIG 119 09/24/2015 0916   HDL 53 09/24/2015 0916   CHOLHDL 3.1 09/24/2015 0916   LDLCALC 88 09/24/2015 0916     Wt Readings from Last 3 Encounters:  11/20/15 206 lb (93.441 kg)  10/21/15 210 lb 11.2 oz (95.573 kg)  10/18/15 221 lb (100.245 kg)     Other studies Reviewed: Additional studies/ records that were reviewed today include: Hospital records and testing.  ASSESSMENT AND PLAN:  1.  Severe aortic stenosis: Gradients are severe but heart muscle function is normal. She is not having any symptoms attributable to the atrial fibrillation. No chest pain or shortness of breath, no syncope. This does make her at higher risk for the shoulder surgery and Dr. Martinique will need to weigh in on the possibility of further testing. Will ask Dr. Martinique if it is time for referral to the TAVR clinic.  2. Chronic anticoagulation: She is compliant with her Xarelto when not having any bleeding issues.  3. Chronic diastolic  CHF: Her weight is  good today and she is tolerating Lasix 3 times a week with extra when necessary. No changes today. We will check a BMET to make sure her electrolytes and renal function are stable.  4. Hypertension: Her blood pressure is currently well controlled, no changes  Current medicines are reviewed at length with the patient today.  The patient does not have concerns regarding medicines.  The following changes have been made:  no change  Labs/ tests ordered today include:   Orders Placed This Encounter  Procedures  . Basic metabolic panel  . EKG 12-Lead     Disposition:   FU with Dr. Martinique  Signed, Lenoard Aden  11/20/2015 4:21 PM    [facility] [address], [place], [place]  [address] Phone: [phone]; Fax: [phone]

## 2015-11-20 NOTE — Telephone Encounter (Signed)
Xarelto approved through 10/26/2016. PA [id number].

## 2015-11-20 NOTE — Telephone Encounter (Signed)
Medication list updated and Rx faxed to [facility]. Patient notified.

## 2015-11-25 ENCOUNTER — Other Ambulatory Visit: Payer: Self-pay | Admitting: Internal Medicine

## 2015-11-26 ENCOUNTER — Other Ambulatory Visit: Payer: Self-pay | Admitting: Orthopedic Surgery

## 2015-11-26 DIAGNOSIS — M25511 Pain in right shoulder: Secondary | ICD-10-CM

## 2015-11-26 DIAGNOSIS — M19011 Primary osteoarthritis, right shoulder: Secondary | ICD-10-CM | POA: Diagnosis not present

## 2015-11-27 ENCOUNTER — Ambulatory Visit (INDEPENDENT_AMBULATORY_CARE_PROVIDER_SITE_OTHER): Payer: Medicare Other | Admitting: Internal Medicine

## 2015-11-27 ENCOUNTER — Encounter: Payer: Self-pay | Admitting: Internal Medicine

## 2015-11-27 VITALS — BP 136/74 | HR 72 | Temp 97.6°F | Resp 20 | Ht 63.0 in | Wt 211.2 lb

## 2015-11-27 DIAGNOSIS — E119 Type 2 diabetes mellitus without complications: Secondary | ICD-10-CM | POA: Diagnosis not present

## 2015-11-27 DIAGNOSIS — I10 Essential (primary) hypertension: Secondary | ICD-10-CM | POA: Diagnosis not present

## 2015-11-27 DIAGNOSIS — I4892 Unspecified atrial flutter: Secondary | ICD-10-CM | POA: Insufficient documentation

## 2015-11-27 DIAGNOSIS — E039 Hypothyroidism, unspecified: Secondary | ICD-10-CM

## 2015-11-27 DIAGNOSIS — I350 Nonrheumatic aortic (valve) stenosis: Secondary | ICD-10-CM

## 2015-11-27 DIAGNOSIS — J329 Chronic sinusitis, unspecified: Secondary | ICD-10-CM | POA: Diagnosis not present

## 2015-11-27 DIAGNOSIS — R0982 Postnasal drip: Secondary | ICD-10-CM

## 2015-11-27 DIAGNOSIS — I483 Typical atrial flutter: Secondary | ICD-10-CM

## 2015-11-27 DIAGNOSIS — E785 Hyperlipidemia, unspecified: Secondary | ICD-10-CM

## 2015-11-27 DIAGNOSIS — M25511 Pain in right shoulder: Secondary | ICD-10-CM | POA: Diagnosis not present

## 2015-11-27 DIAGNOSIS — R609 Edema, unspecified: Secondary | ICD-10-CM

## 2015-11-27 MED ORDER — TRIAMCINOLONE ACETONIDE 55 MCG/ACT NA AERO: 2.0000 | INHALATION_SPRAY | Freq: Every day | NASAL | Status: DC

## 2015-11-27 MED ORDER — VITAMIN D 1000 UNITS PO TABS: ORAL_TABLET | ORAL | Status: DC

## 2015-11-27 NOTE — Progress Notes (Signed)
Patient ID: Patricia [name], female   DOB: [birth year], 80 y.o.   MRN: [id number]    Facility  [facility]    Place of Service:   OFFICE    Allergies  Allergen Reactions  . Codeine Hives  . Vesicare [Solifenacin]     unknown    Chief Complaint  Patient presents with  . Hospitalization Follow-up    HPI:  Hospitalized 10/18/2015 through 10/21/2015. Had atrial fibrillation with rapid ventricular response. 2-D echocardiogram on 10/19/2015 showed LVEF 55-60%. There was moderate stenosis aortic valve. She was started on amiodarone 400 mg twice daily for 4 weeks and then switch to 200 mg twice daily  and Xarelto. Cardizem CD 180 mg daily was also added.  Suggestion was made at the time of discharge she might need a sleep study.  Patient seen by cardiologist, Dr. Martinique on 11/20/2015. She stated that she had been doing well. Weight had fallen from 221 pounds on 10/18/2015 down to 206 pounds on 11/20/2015. She remains on amiodarone 200 mg daily.  Patient is now contemplating right total shoulder replacement. She has been seen by Dr. [name]. I believe her cardiologist should be involved in her surgical clearance.  Medications: Patient's Medications  New Prescriptions   No medications on file  Previous Medications   ADVAIR DISKUS 100-50 MCG/DOSE AEPB    Inhale 1 puff two times  daily for breathing   ALBUTEROL (PROVENTIL HFA;VENTOLIN HFA) 108 (90 BASE) MCG/ACT INHALER    Inhale 2 puffs into the lungs every 6 (six) hours as needed for wheezing or shortness of breath.   AMIODARONE (PACERONE) 200 MG TABLET    Take one tablet daily by mouth   CETIRIZINE (ZYRTEC) 10 MG TABLET    Take 10 mg by mouth daily.   COMFORT LANCETS MISC    Use daily to get blood for glucometer   DILTIAZEM (CARDIZEM CD) 180 MG 24 HR CAPSULE    Take 1 capsule by mouth  once a day to control blood pressure   DOXAZOSIN (CARDURA) 4 MG TABLET    Take one tablet by mouth once daily to help control blood pressure   FUROSEMIDE  (LASIX) 40 MG TABLET    Take 1 tablet (40 mg total) by mouth See admin instructions. Take 1 tablet on Monday/Wednesday and Friday. May take additional dose as needed for increased swelling or weight gain >3 lbs in 24 hr   GLIMEPIRIDE (AMARYL) 2 MG TABLET    Take 1 tablet by mouth at  breakfast to control  Glucose   GLUCOSE BLOOD (BAYER CONTOUR TEST) TEST STRIP    Use daily too check glucose   LEVOTHYROXINE (SYNTHROID, LEVOTHROID) 50 MCG TABLET    Take 1 tablet by mouth  daily for thyroid  supplement   LISINOPRIL (PRINIVIL,ZESTRIL) 20 MG TABLET    Take 1 tablet by mouth once daily for blood pressure   METFORMIN (GLUCOPHAGE) 500 MG TABLET    Take 500 mg by mouth 2 (two) times daily with a meal.   OXYBUTYNIN (DITROPAN) 5 MG TABLET    One twice daily to help control bladder   POTASSIUM 95 MG TABS    Take 1 tablet by mouth daily.    PRAVASTATIN (PRAVACHOL) 20 MG TABLET    Take 1 tablet by mouth  daily   RIVAROXABAN (XARELTO) 20 MG TABS TABLET    Take 1 tablet (20 mg total) by mouth daily with supper.  Modified Medications   No medications on file  Discontinued Medications  No medications on file    Review of Systems  Constitutional: Negative for fever, chills, diaphoresis, activity change, appetite change, fatigue and unexpected weight change.       Gaining weight  HENT: Positive for postnasal drip. Negative for congestion, ear discharge, ear pain, hearing loss, rhinorrhea, sore throat, tinnitus, trouble swallowing and voice change.   Eyes: Negative for pain, redness, itching and visual disturbance.  Respiratory: Negative for cough, choking, shortness of breath and wheezing.   Cardiovascular: Negative for chest pain, palpitations and leg swelling.       Aortic valve ejection murmur  Gastrointestinal: Negative for nausea, abdominal pain, diarrhea, constipation and abdominal distention.  Endocrine: Negative for cold intolerance, heat intolerance, polydipsia, polyphagia and polyuria.        Diabetic. Hypothyroid.  Genitourinary: Negative for dysuria, urgency, frequency, hematuria, flank pain, vaginal discharge, difficulty urinating and pelvic pain.  Musculoskeletal: Positive for back pain and arthralgias. Negative for myalgias, gait problem, neck pain and neck stiffness.       Pain in the right shoulder. History of surgery in the past by Dr. Shellia Carwin. Has seen Dr. Eddie Dibbles about pain in the right knee. Got injection but it did not help. He talked about using a gel injection. Knee gives out sometimes. Using a cane at times. Recurrent problems with mild discomfort.  Skin: Negative for color change, pallor and rash.  Allergic/Immunologic: Negative.   Neurological: Negative for dizziness, tremors, seizures, syncope, weakness, numbness and headaches.  Hematological: Negative for adenopathy. Does not bruise/bleed easily.       Anemic on lab in October 2016  Psychiatric/Behavioral: Negative.  Negative for suicidal ideas, hallucinations, behavioral problems, confusion, sleep disturbance, dysphoric mood and agitation. The patient is not nervous/anxious and is not hyperactive.     Filed Vitals:   11/27/15 1517  BP: 136/74  Pulse: 72  Temp: 97.6 F (36.4 C)  TempSrc: Oral  Resp: 20  Height: _0  (1.6 m)  Weight: 211 lb 3.2 oz (95.8 kg)  SpO2: 95%   Body mass index is 37.42 kg/(m^2). Filed Weights   11/27/15 1517  Weight: 211 lb 3.2 oz (95.8 kg)     Physical Exam  Constitutional: She is oriented to person, place, and time. No distress.  Obese  HENT:  Head: Normocephalic and atraumatic.  Right Ear: External ear normal.  Left Ear: External ear normal.  Nose: Nose normal.  Mouth/Throat: Oropharynx is clear and moist.  Eyes: Conjunctivae and EOM are normal.  Neck: Normal range of motion. Neck supple. No JVD present. No tracheal deviation present. No thyromegaly present.  Cardiovascular: Normal rate, regular rhythm and intact distal pulses.  Exam reveals no gallop and no  friction rub.   Murmur (2/6 SEM at the aortic area and LSB) heard. 3/6 aortic ejection murmur  Pulmonary/Chest: No respiratory distress. She has no wheezes. She exhibits no tenderness.  Abdominal: Bowel sounds are normal. She exhibits no distension and no mass. There is no tenderness.  Musculoskeletal: Normal range of motion. She exhibits edema. She exhibits no tenderness.  Pain in the both shoulders (R>L) with rotational movement. Pain in both knees (R>L).  Lymphadenopathy:    She has no cervical adenopathy.  Neurological: She is alert and oriented to person, place, and time. She has normal reflexes. No cranial nerve deficit. Coordination normal.  Skin: No rash noted. No erythema. No pallor.  Psychiatric: She has a normal mood and affect. Judgment and thought content normal.    Labs reviewed: Lab Summary Latest Ref  Rng 11/20/2015 10/26/2015 10/20/2015 10/19/2015 10/18/2015  Hemoglobin 12.0 - 15.0 g/dL (None) (None) (None) 9.6(L) 10.0(L)  Hematocrit 36.0 - 46.0 % (None) (None) (None) 29.6(L) 32.2(L)  White count 4.0 - 10.5 K/uL (None) (None) (None) 7.1 9.2  Platelet count 150 - 400 K/uL (None) (None) (None) 253 269  Sodium 135 - 146 mmol/L 136 134(L) 137 140 137  Potassium 3.5 - 5.3 mmol/L 4.9 4.4 4.2 3.9 4.4  Calcium 8.6 - 10.4 mg/dL 9.8 10.3 8.8(L) 9.0 9.1  Phosphorus - (None) (None) (None) (None) (None)  Creatinine 0.60 - 0.93 mg/dL 1.47(H) 1.06(H) 1.23(H) 1.05(H) 0.96  AST 15 - 41 U/L (None) (None) (None) 16 (None)  Alk Phos 38 - 126 U/L (None) (None) (None) 57 (None)  Bilirubin 0.3 - 1.2 mg/dL (None) (None) (None) 0.7 (None)  Glucose 65 - 99 mg/dL 103(H) 119(H) 127(H) 149(H) 115(H)  Cholesterol - (None) (None) (None) (None) (None)  HDL cholesterol - (None) (None) (None) (None) (None)  Triglycerides - (None) (None) (None) (None) (None)  LDL Direct - (None) (None) (None) (None) (None)  LDL Calc - (None) (None) (None) (None) (None)  Total protein 6.5 - 8.1 g/dL (None) (None) (None)  6.8 (None)  Albumin 3.5 - 5.0 g/dL (None) (None) (None) 3.4(L) (None)   Lab Results  Component Value Date   TSH 3.029 10/18/2015   TSH 3.150 09/24/2015   TSH 2.110 05/21/2015   Lab Results  Component Value Date   BUN 45* 11/20/2015   BUN 23 10/26/2015   BUN 23* 10/20/2015   Lab Results  Component Value Date   HGBA1C 7.0* 09/24/2015   HGBA1C 7.4* 05/21/2015   HGBA1C 7.0* 01/22/2015   Dg Chest Portable 1 View  10/18/2015  CLINICAL DATA:  Shortness of breath for 3 days. Atrial fibrillation and tachycardia. EXAM: PORTABLE CHEST 1 VIEW COMPARISON:  PA and lateral chest 07/18/2015. FINDINGS: There is cardiomegaly and pulmonary vascular congestion. No consolidative process, pneumothorax or effusion is identified. Marked degenerative change about the shoulders appears worse on the right. Postoperative change left shoulder noted IMPRESSION: Cardiomegaly and pulmonary vascular congestion. Electronically Signed   By: Inge Rise M.D.   On: 10/18/2015 18:24     Assessment/Plan  1. Typical atrial flutter (HCC) Resolved and stabilized on current mediccations  2. Type 2 diabetes mellitus without complication, without long-term current use of insulin (HCC) controlled  3. Essential hypertension controlled  4. Hyperlipidemia controlled  5. Hypothyroidism, unspecified hypothyroidism type Recheck at next visit  6. Edema, unspecified type resolved  7. Aortic stenosis Moderate to seveere on recent echo  8. Post-nasal drainage - triamcinolone (NASACORT AQ) 55 MCG/ACT AERO nasal inhaler; Place 2 sprays into the nose daily.  Dispense: 1 Inhaler; Refill: 12  9. Pain in joint of right shoulder Disabling. She is cleared medically for surgery, but her cardiologist will need to have input on final clearance.

## 2015-11-29 ENCOUNTER — Other Ambulatory Visit: Payer: Self-pay | Admitting: Physician Assistant

## 2015-11-29 ENCOUNTER — Ambulatory Visit
Admission: RE | Admit: 2015-11-29 | Discharge: 2015-11-29 | Disposition: A | Discharge status: Home / Self Care | Payer: Medicare Other | Source: Ambulatory Visit | Attending: Orthopedic Surgery | Admitting: Orthopedic Surgery

## 2015-11-29 DIAGNOSIS — M19011 Primary osteoarthritis, right shoulder: Secondary | ICD-10-CM | POA: Diagnosis not present

## 2015-11-29 DIAGNOSIS — M25511 Pain in right shoulder: Secondary | ICD-10-CM

## 2015-11-29 DIAGNOSIS — I350 Nonrheumatic aortic (valve) stenosis: Secondary | ICD-10-CM

## 2015-11-30 ENCOUNTER — Telehealth: Payer: Self-pay

## 2015-11-30 NOTE — Telephone Encounter (Signed)
Received surgical clearance from Dr.Landau's office.Dr.Jordan cleared patient for surgery.Advised to hold Xarelto 2 days prior to surgery.Note faxed to fax # [phone].Spoke to patient advised to hold Xarelto 2 days prior to surgery.

## 2015-12-03 NOTE — Telephone Encounter (Signed)
Patient never returned call

## 2015-12-04 ENCOUNTER — Other Ambulatory Visit: Payer: Self-pay | Admitting: Orthopedic Surgery

## 2015-12-07 ENCOUNTER — Encounter (HOSPITAL_COMMUNITY): Payer: Self-pay

## 2015-12-07 ENCOUNTER — Encounter (HOSPITAL_COMMUNITY)
Admission: RE | Admit: 2015-12-07 | Discharge: 2015-12-07 | Disposition: A | Discharge status: Home / Self Care | Payer: Medicare Other | Source: Ambulatory Visit | Attending: Orthopedic Surgery | Admitting: Orthopedic Surgery

## 2015-12-07 DIAGNOSIS — Z7902 Long term (current) use of antithrombotics/antiplatelets: Secondary | ICD-10-CM | POA: Diagnosis not present

## 2015-12-07 DIAGNOSIS — Z01818 Encounter for other preprocedural examination: Secondary | ICD-10-CM | POA: Insufficient documentation

## 2015-12-07 DIAGNOSIS — L409 Psoriasis, unspecified: Secondary | ICD-10-CM | POA: Insufficient documentation

## 2015-12-07 DIAGNOSIS — J45909 Unspecified asthma, uncomplicated: Secondary | ICD-10-CM | POA: Diagnosis not present

## 2015-12-07 DIAGNOSIS — I5032 Chronic diastolic (congestive) heart failure: Secondary | ICD-10-CM | POA: Diagnosis not present

## 2015-12-07 DIAGNOSIS — Z7984 Long term (current) use of oral hypoglycemic drugs: Secondary | ICD-10-CM | POA: Insufficient documentation

## 2015-12-07 DIAGNOSIS — E785 Hyperlipidemia, unspecified: Secondary | ICD-10-CM | POA: Diagnosis not present

## 2015-12-07 DIAGNOSIS — Z79899 Other long term (current) drug therapy: Secondary | ICD-10-CM | POA: Insufficient documentation

## 2015-12-07 DIAGNOSIS — E119 Type 2 diabetes mellitus without complications: Secondary | ICD-10-CM | POA: Diagnosis not present

## 2015-12-07 DIAGNOSIS — Z87891 Personal history of nicotine dependence: Secondary | ICD-10-CM | POA: Insufficient documentation

## 2015-12-07 DIAGNOSIS — M19011 Primary osteoarthritis, right shoulder: Secondary | ICD-10-CM | POA: Diagnosis not present

## 2015-12-07 DIAGNOSIS — Z01812 Encounter for preprocedural laboratory examination: Secondary | ICD-10-CM | POA: Insufficient documentation

## 2015-12-07 DIAGNOSIS — E039 Hypothyroidism, unspecified: Secondary | ICD-10-CM | POA: Diagnosis not present

## 2015-12-07 HISTORY — DX: Reserved for inherently not codable concepts without codable children: IMO0001

## 2015-12-07 HISTORY — DX: Other specified postprocedural states: Z98.890

## 2015-12-07 HISTORY — DX: Other specified postprocedural states: R11.2

## 2015-12-07 LAB — BASIC METABOLIC PANEL
Anion gap: 9 (ref 5–15)
BUN: 23 mg/dL — ABNORMAL HIGH (ref 6–20)
CO2: 29 mmol/L (ref 22–32)
Calcium: 9.8 mg/dL (ref 8.9–10.3)
Chloride: 101 mmol/L (ref 101–111)
Creatinine, Ser: 1.09 mg/dL — ABNORMAL HIGH (ref 0.44–1.00)
GFR calc Af Amer: 55 mL/min — ABNORMAL LOW (ref >60)
GFR calc non Af Amer: 48 mL/min — ABNORMAL LOW (ref >60)
Glucose, Bld: 105 mg/dL — ABNORMAL HIGH (ref 65–99)
Potassium: 4.8 mmol/L (ref 3.5–5.1)
Sodium: 139 mmol/L (ref 135–145)

## 2015-12-07 LAB — CBC
HCT: 34.6 % — ABNORMAL LOW (ref 36.0–46.0)
Hemoglobin: 10.9 g/dL — ABNORMAL LOW (ref 12.0–15.0)
MCH: 26.6 pg (ref 26.0–34.0)
MCHC: 31.5 g/dL (ref 30.0–36.0)
MCV: 84.4 fL (ref 78.0–100.0)
Platelets: 241 10*3/uL (ref 150–400)
RBC: 4.1 MIL/uL (ref 3.87–5.11)
RDW: 16.3 % — ABNORMAL HIGH (ref 11.5–15.5)
WBC: 5.7 10*3/uL (ref 4.0–10.5)

## 2015-12-07 LAB — SURGICAL PCR SCREEN
MRSA, PCR: NEGATIVE
Staphylococcus aureus: POSITIVE — AB

## 2015-12-07 LAB — GLUCOSE, CAPILLARY: Glucose-Capillary: 101 mg/dL — ABNORMAL HIGH (ref 65–99)

## 2015-12-07 NOTE — Progress Notes (Signed)
PCP:Dr.Arthur Nyoka Cowden Cardiologist: Dr.[name] Martinique  Fasting blood sugars 100   Pt. Stated she is to stop xarelto 2 days prior to surgery.

## 2015-12-07 NOTE — Progress Notes (Signed)
I called a prescription for Mupirocin ointment to [facility] , [address] and [place].

## 2015-12-07 NOTE — Pre-Procedure Instructions (Signed)
LAVAEH BAU  12/07/2015      [facility] [address] - [place], [place] [address] Phone: [phone] Fax: [phone]  [place] [address] [place] [address] [place] [address] Phone: [phone] Fax: [phone]    Your procedure is scheduled on Tues. Feb. 21  Report to [facility] Admitting at 8:45 A.M.  Call this number if you have problems the morning of surgery:  [phone]              Any questions prior to surgery call [phone] Monday-Friday 8am-4pm   Remember:  Do not eat food or drink liquids after midnight on Mon. Feb. 20  Take these medicines the morning of surgery with A SIP OF WATER : advair-bring to hospital, albuterol inhaler if needed-bring to hospital, amiodarone(pacerone),diltiazem(cardizem), doxazosis (cardura), levothyroxine(synthroid), oxybutynin(ditropan), nasacort              Stop NSAIDS: aleve, ibuprofen, motrin, advil, BC"S,Goody's. Stop vitamins/herbal medicines            Stop xarelto 2 days prior to surgery per Dr. Mardelle Matte and Dr. Martinique   How to Manage Your Diabetes Before Surgery   Why is it important to control my blood sugar before and after surgery?   Improving blood sugar levels before and after surgery helps healing and can limit problems.  A way of improving blood sugar control is eating a healthy diet by:  - Eating less sugar and carbohydrates  - Increasing activity/exercise  - Talk with your doctor about reaching your blood sugar goals  High blood sugars (greater than 180 mg/dL) can raise your risk of infections and slow down your recovery so you will need to focus on controlling your diabetes during the weeks before surgery.  Make sure that the doctor who takes care of your diabetes knows about your planned surgery including the date and location.  How  do I manage my blood sugars before surgery?   Check your blood sugar at least 4 times a day, 2 days before surgery to make sure that they are not too high or low.   Check your blood sugar the morning of your surgery when you wake up and every 2               hours until you get to the Short-Stay unit.  If your blood sugar is less than 70 mg/dL, you will need to treat for low blood sugar by:  Treat a low blood sugar (less than 70 mg/dL) with 1/2 cup of clear juice (cranberry or apple), 4 glucose tablets, OR glucose gel.  Recheck blood sugar in 15 minutes after treatment (to make sure it is greater than 70 mg/dL).  If blood sugar is not greater than 70 mg/dL on re-check, call [phone] for further instructions.   Report your blood sugar to the Short-Stay nurse when you get to Short-Stay.  References:  [facility], 2007 "How to Manage your Diabetes Before and After Surgery".  What do I do about my diabetes medications?   Do not take oral diabetes medicines (pills) the morning of surgery.     Do not wear jewelry, make-up or nail polish.  Do not wear lotions, powders, or perfumes.  You may not wear deodorant.  Do not shave 48 hours prior  to surgery.  Men may shave face and neck.  Do not bring valuables to the hospital.  [facility] is not responsible for any belongings or valuables.  Contacts, dentures or bridgework may not be worn into surgery.  Leave your suitcase in the car.  After surgery it may be brought to your room.  For patients admitted to the hospital, discharge time will be determined by your treatment team.  Patients discharged the day of surgery will not be allowed to drive home.   Name and phone number of your driver:   Special instructions: review handouts  Please read over the following fact sheets that you were given. Pain Booklet, Coughing and Deep Breathing, MRSA Information and Surgical Site Infection Prevention

## 2015-12-08 LAB — HEMOGLOBIN A1C
Hgb A1c MFr Bld: 7.2 % — ABNORMAL HIGH (ref 4.8–5.6)
Mean Plasma Glucose: 160 mg/dL

## 2015-12-11 NOTE — Progress Notes (Addendum)
Anesthesia Chart Review: Patient is a 80 year old female scheduled for right total shoulder arthroplasty on 12/18/15 by Dr. Mardelle Matte.  History includes former smoker, post-operative N/V, asthma, DM2, afib s/p cardioversion 09/2015 (admission for new afib with RVR 10/18/15), moderate-severe AS 09/2015 echo, chronic diastolic CHF, hypothyroidism, HLD, psoriasis, kyphoscoliosis, exertional dyspnea, cholecystectomy, hysterectomy.    Meds include Advair, albuterol, amiodarone, Zyrtec, diltiazem, doxazosin, Lasix, glimepiride, levothyroxine, lisinopril, metformin, oxybutynin, potassium, pravastatin, Xarelto, Nasacort. Dr. Martinique gave permission to hold Xarelto 2 days prior to surgery.  PCP is Dr. Jeanmarie Hubert who medically cleared patient for surgery. Cardiologist is Dr. [name] Martinique, last visit with Rosaria Ferries 11/20/15. Her assessment includes:  1. Severe aortic stenosis: Gradients are severe but heart muscle function is normal. She is not having any symptoms attributable to the atrial fibrillation. No chest pain or shortness of breath, no syncope. This does make her at higher risk for the shoulder surgery and Dr. Martinique will need to weigh in on the possibility of further testing. Will ask Dr. Martinique if it is time for referral to the TAVR clinic. 11/30/15 Pugh, LPN telephone note indicated Dr. Martinique cleared patient for surgery with permission to hold Xarelto 2 days prior to surgery.    11/20/15 EKG: SR, first degree AVB, non-specific ST abnormality.  10/19/15 Echo: Study Conclusions - Left ventricle: The cavity size was normal. There was mild concentric hypertrophy. Systolic function was normal. The estimated ejection fraction was in the range of 55% to 60%. Wall motion was normal; there were no regional wall motion abnormalities. Doppler parameters are consistent with elevated mean left atrial filling pressure. - Aortic valve: There was moderate stenosis. Valve area (VTI): 1.21 cm^2.  Valve area (Vmax): 1.18 cm^2. Valve area (Vmean): 1.19 cm^2. - Mitral valve: Calcified annulus. Mildly thickened leaflets . There was mild to moderate regurgitation directed centrally. Valve area by continuity equation (using LVOT flow): 1.85 cm^2. - Left atrium: The atrium was severely dilated. - Right atrium: The atrium was mildly dilated. - Pulmonary arteries: Systolic pressure was mildly increased. PA peak pressure: 42 mm Hg (S).  10/19/15 TEE: Impressions: - Normal LV function; biatrial enlargement; no LAA thrombus; calcified aortic valve with reduced cusp excursion; mild MR and TR; patent foramen ovale noted.  Reportedly, she had a normal stress test by Dr. Tamala Julian years ago.  10/18/15 1V CXR: IMPRESSION: Cardiomegaly and pulmonary vascular congestion.  Preoperative labs noted. H/H 10.9/34.6. Glucose 105. A1c 7.2. Cr 1.09.   Reviewed above with anesthesiologist Dr. Conrad Alberton. If peripheral nerve block desired, Xarelto will need to be held for 3 days. I'll follow-up with Dr. Doug Sou office to ensure no contraindication.   George Hugh [facility] Short Stay Center/Anesthesiology Phone [phone] 12/11/2015 5:10 PM  Addendum: I received permission for Dr. Martinique to have patient hold Xarelto for 3 days before surgery (last dose 12/14/15). Patient is aware. I also left a voice message for Sherri at Dr. Luanna Cole office. Patient did report issues with post-operative N/V in the past, but did well with "IV medication and patch" with her breast lumpectomy on 11/12/09. I'll request her [place] anesthesia record to have available for anesthesiologist review as needed.  George Hugh [facility] Short Stay Center/Anesthesiology Phone [phone] 12/12/2015 4:35 PM

## 2015-12-12 ENCOUNTER — Telehealth: Payer: Self-pay | Admitting: Cardiology

## 2015-12-12 NOTE — Telephone Encounter (Signed)
Received a call from Patricia Gianotti PA with [facility] calling to ask Dr.Jordan if ok for patient to hold xarelto 3 days instead of 2 days for a nerve block.Spoke to [place] he advised ok to hold xarelto for 3 days.

## 2015-12-17 MED ORDER — CEFAZOLIN SODIUM-DEXTROSE 2-3 GM-% IV SOLR
2.0000 g | INTRAVENOUS | Status: AC
  Administered 2015-12-18: 2 g via INTRAVENOUS
  Filled 2015-12-17: qty 50

## 2015-12-18 ENCOUNTER — Encounter (HOSPITAL_COMMUNITY): Payer: Self-pay | Admitting: Certified Registered"

## 2015-12-18 ENCOUNTER — Inpatient Hospital Stay (HOSPITAL_COMMUNITY): Payer: Medicare Other | Admitting: Certified Registered"

## 2015-12-18 ENCOUNTER — Inpatient Hospital Stay (HOSPITAL_COMMUNITY): Payer: Medicare Other | Admitting: Vascular Surgery

## 2015-12-18 ENCOUNTER — Encounter (HOSPITAL_COMMUNITY)
Admission: RE | Disposition: A | Discharge status: Home Health | Payer: Self-pay | Source: Ambulatory Visit | Attending: Orthopedic Surgery

## 2015-12-18 ENCOUNTER — Inpatient Hospital Stay (HOSPITAL_COMMUNITY): Payer: Medicare Other

## 2015-12-18 ENCOUNTER — Inpatient Hospital Stay (HOSPITAL_COMMUNITY)
Admission: RE | Admit: 2015-12-18 | Discharge: 2015-12-20 | DRG: 483 | Disposition: A | Discharge status: Home Health | Payer: Medicare Other | Source: Ambulatory Visit | Attending: Orthopedic Surgery | Admitting: Orthopedic Surgery

## 2015-12-18 DIAGNOSIS — I350 Nonrheumatic aortic (valve) stenosis: Secondary | ICD-10-CM

## 2015-12-18 DIAGNOSIS — Z7901 Long term (current) use of anticoagulants: Secondary | ICD-10-CM | POA: Diagnosis not present

## 2015-12-18 DIAGNOSIS — Z96611 Presence of right artificial shoulder joint: Secondary | ICD-10-CM | POA: Diagnosis not present

## 2015-12-18 DIAGNOSIS — Z87891 Personal history of nicotine dependence: Secondary | ICD-10-CM | POA: Diagnosis not present

## 2015-12-18 DIAGNOSIS — I4891 Unspecified atrial fibrillation: Secondary | ICD-10-CM | POA: Diagnosis present

## 2015-12-18 DIAGNOSIS — Z471 Aftercare following joint replacement surgery: Secondary | ICD-10-CM | POA: Diagnosis not present

## 2015-12-18 DIAGNOSIS — E119 Type 2 diabetes mellitus without complications: Secondary | ICD-10-CM | POA: Diagnosis present

## 2015-12-18 DIAGNOSIS — M19011 Primary osteoarthritis, right shoulder: Secondary | ICD-10-CM | POA: Diagnosis not present

## 2015-12-18 DIAGNOSIS — D649 Anemia, unspecified: Secondary | ICD-10-CM | POA: Diagnosis present

## 2015-12-18 DIAGNOSIS — J449 Chronic obstructive pulmonary disease, unspecified: Secondary | ICD-10-CM | POA: Diagnosis present

## 2015-12-18 DIAGNOSIS — M25511 Pain in right shoulder: Secondary | ICD-10-CM | POA: Diagnosis not present

## 2015-12-18 DIAGNOSIS — G8918 Other acute postprocedural pain: Secondary | ICD-10-CM | POA: Diagnosis not present

## 2015-12-18 HISTORY — PX: TOTAL SHOULDER ARTHROPLASTY: SHX126

## 2015-12-18 HISTORY — DX: Primary osteoarthritis, right shoulder: M19.011

## 2015-12-18 LAB — GLUCOSE, CAPILLARY
Glucose-Capillary: 170 mg/dL — ABNORMAL HIGH (ref 65–99)
Glucose-Capillary: 146 mg/dL — ABNORMAL HIGH (ref 65–99)
Glucose-Capillary: 119 mg/dL — ABNORMAL HIGH (ref 65–99)
Glucose-Capillary: 115 mg/dL — ABNORMAL HIGH (ref 65–99)

## 2015-12-18 SURGERY — ARTHROPLASTY, SHOULDER, TOTAL
Anesthesia: Regional | Site: Shoulder | Laterality: Right

## 2015-12-18 MED ORDER — GLYCOPYRROLATE 0.2 MG/ML IJ SOLN
INTRAMUSCULAR | Status: DC | PRN
  Administered 2015-12-18: 0.2 mg via INTRAVENOUS

## 2015-12-18 MED ORDER — LIDOCAINE HCL (CARDIAC) 20 MG/ML IV SOLN
INTRAVENOUS | Status: DC | PRN
  Administered 2015-12-18: 100 mg via INTRAVENOUS

## 2015-12-18 MED ORDER — ALUM & MAG HYDROXIDE-SIMETH 200-200-20 MG/5ML PO SUSP: 30.0000 mL | ORAL | Status: DC | PRN

## 2015-12-18 MED ORDER — DILTIAZEM HCL ER COATED BEADS 180 MG PO CP24
180.0000 mg | ORAL_CAPSULE | Freq: Every day | ORAL | Status: DC
  Administered 2015-12-19 – 2015-12-20 (×2): 180 mg via ORAL
  Filled 2015-12-18 (×2): qty 1

## 2015-12-18 MED ORDER — SUGAMMADEX SODIUM 200 MG/2ML IV SOLN
INTRAVENOUS | Status: DC | PRN
  Administered 2015-12-18: 200 mg via INTRAVENOUS

## 2015-12-18 MED ORDER — PHENYLEPHRINE HCL 10 MG/ML IJ SOLN
10.0000 mg | INTRAVENOUS | Status: DC | PRN
  Administered 2015-12-18: 30 ug/min via INTRAVENOUS

## 2015-12-18 MED ORDER — ROCURONIUM BROMIDE 100 MG/10ML IV SOLN: INTRAVENOUS | Status: DC | PRN

## 2015-12-18 MED ORDER — SUCCINYLCHOLINE CHLORIDE 20 MG/ML IJ SOLN
INTRAMUSCULAR | Status: DC | PRN
  Administered 2015-12-18: 120 mg via INTRAVENOUS

## 2015-12-18 MED ORDER — DOXAZOSIN MESYLATE 4 MG PO TABS
4.0000 mg | ORAL_TABLET | Freq: Every day | ORAL | Status: DC
  Administered 2015-12-19 – 2015-12-20 (×2): 4 mg via ORAL
  Filled 2015-12-18 (×2): qty 1

## 2015-12-18 MED ORDER — FENTANYL CITRATE (PF) 250 MCG/5ML IJ SOLN
INTRAMUSCULAR | Status: AC
  Filled 2015-12-18: qty 5

## 2015-12-18 MED ORDER — SODIUM CHLORIDE 0.9 % IR SOLN
Status: DC | PRN
  Administered 2015-12-18: 1000 mL

## 2015-12-18 MED ORDER — MIDAZOLAM HCL 2 MG/2ML IJ SOLN
INTRAMUSCULAR | Status: AC
  Administered 2015-12-18: 2 mg
  Administered 2015-12-18 (×2): 1 mg via INTRAVENOUS
  Filled 2015-12-18: qty 2

## 2015-12-18 MED ORDER — ONDANSETRON HCL 4 MG/2ML IJ SOLN
INTRAMUSCULAR | Status: AC
  Filled 2015-12-18: qty 2

## 2015-12-18 MED ORDER — MAGNESIUM CITRATE PO SOLN: 1.0000 | Freq: Once | ORAL | Status: DC | PRN

## 2015-12-18 MED ORDER — 0.9 % SODIUM CHLORIDE (POUR BTL) OPTIME
TOPICAL | Status: DC | PRN
  Administered 2015-12-18: 1000 mL

## 2015-12-18 MED ORDER — FUROSEMIDE 40 MG PO TABS
40.0000 mg | ORAL_TABLET | ORAL | Status: DC
  Administered 2015-12-19: 40 mg via ORAL
  Filled 2015-12-18: qty 1

## 2015-12-18 MED ORDER — RIVAROXABAN 20 MG PO TABS
20.0000 mg | ORAL_TABLET | Freq: Every day | ORAL | Status: DC
  Administered 2015-12-18 – 2015-12-19 (×2): 20 mg via ORAL
  Filled 2015-12-18 (×2): qty 1

## 2015-12-18 MED ORDER — LISINOPRIL 20 MG PO TABS
20.0000 mg | ORAL_TABLET | Freq: Every day | ORAL | Status: DC
  Administered 2015-12-18 – 2015-12-20 (×3): 20 mg via ORAL
  Filled 2015-12-18 (×3): qty 1

## 2015-12-18 MED ORDER — DIPHENHYDRAMINE HCL 12.5 MG/5ML PO ELIX: 12.5000 mg | ORAL_SOLUTION | ORAL | Status: DC | PRN

## 2015-12-18 MED ORDER — FENTANYL CITRATE (PF) 100 MCG/2ML IJ SOLN: 25.0000 ug | INTRAMUSCULAR | Status: DC | PRN

## 2015-12-18 MED ORDER — EPHEDRINE SULFATE 50 MG/ML IJ SOLN
INTRAMUSCULAR | Status: AC
  Filled 2015-12-18: qty 1

## 2015-12-18 MED ORDER — ONDANSETRON HCL 4 MG/2ML IJ SOLN
4.0000 mg | Freq: Four times a day (QID) | INTRAMUSCULAR | Status: DC | PRN
  Administered 2015-12-19 (×2): 4 mg via INTRAVENOUS
  Filled 2015-12-18 (×2): qty 2

## 2015-12-18 MED ORDER — GLIMEPIRIDE 2 MG PO TABS
2.0000 mg | ORAL_TABLET | Freq: Every day | ORAL | Status: DC
  Administered 2015-12-19 – 2015-12-20 (×2): 2 mg via ORAL
  Filled 2015-12-18 (×2): qty 1

## 2015-12-18 MED ORDER — METHOCARBAMOL 1000 MG/10ML IJ SOLN: 500.0000 mg | Freq: Four times a day (QID) | INTRAVENOUS | Status: DC | PRN

## 2015-12-18 MED ORDER — ROCURONIUM BROMIDE 100 MG/10ML IV SOLN
INTRAVENOUS | Status: DC | PRN
  Administered 2015-12-18: 10 mg via INTRAVENOUS
  Administered 2015-12-18 (×2): 5 mg via INTRAVENOUS
  Administered 2015-12-18: 40 mg via INTRAVENOUS
  Administered 2015-12-18: 10 mg via INTRAVENOUS

## 2015-12-18 MED ORDER — EPHEDRINE SULFATE 50 MG/ML IJ SOLN
INTRAMUSCULAR | Status: DC | PRN
  Administered 2015-12-18: 5 mg via INTRAVENOUS
  Administered 2015-12-18 (×3): 10 mg via INTRAVENOUS

## 2015-12-18 MED ORDER — SUCCINYLCHOLINE CHLORIDE 20 MG/ML IJ SOLN
INTRAMUSCULAR | Status: AC
  Filled 2015-12-18: qty 1

## 2015-12-18 MED ORDER — PROPOFOL 10 MG/ML IV BOLUS
INTRAVENOUS | Status: AC
  Filled 2015-12-18: qty 20

## 2015-12-18 MED ORDER — METFORMIN HCL 500 MG PO TABS
500.0000 mg | ORAL_TABLET | Freq: Two times a day (BID) | ORAL | Status: DC
  Administered 2015-12-18 – 2015-12-20 (×4): 500 mg via ORAL
  Filled 2015-12-18 (×4): qty 1

## 2015-12-18 MED ORDER — PROPOFOL 10 MG/ML IV BOLUS: INTRAVENOUS | Status: DC | PRN

## 2015-12-18 MED ORDER — AMIODARONE HCL 200 MG PO TABS
200.0000 mg | ORAL_TABLET | Freq: Every day | ORAL | Status: DC
  Administered 2015-12-19 – 2015-12-20 (×2): 200 mg via ORAL
  Filled 2015-12-18 (×2): qty 1

## 2015-12-18 MED ORDER — ONDANSETRON HCL 4 MG PO TABS: 4.0000 mg | ORAL_TABLET | Freq: Four times a day (QID) | ORAL | Status: DC | PRN

## 2015-12-18 MED ORDER — BUPIVACAINE HCL (PF) 0.25 % IJ SOLN
INTRAMUSCULAR | Status: AC
  Filled 2015-12-18: qty 30

## 2015-12-18 MED ORDER — FENTANYL CITRATE (PF) 100 MCG/2ML IJ SOLN
INTRAMUSCULAR | Status: AC
  Administered 2015-12-18: 50 ug
  Filled 2015-12-18: qty 2

## 2015-12-18 MED ORDER — ACETAMINOPHEN 325 MG PO TABS
650.0000 mg | ORAL_TABLET | Freq: Four times a day (QID) | ORAL | Status: DC | PRN
Start: 2015-12-18 — End: 2015-12-20

## 2015-12-18 MED ORDER — NEOSTIGMINE METHYLSULFATE 10 MG/10ML IV SOLN
INTRAVENOUS | Status: AC
  Filled 2015-12-18: qty 1

## 2015-12-18 MED ORDER — MIDAZOLAM HCL 2 MG/2ML IJ SOLN
INTRAMUSCULAR | Status: AC
  Filled 2015-12-18: qty 2

## 2015-12-18 MED ORDER — METOCLOPRAMIDE HCL 5 MG/ML IJ SOLN
5.0000 mg | Freq: Three times a day (TID) | INTRAMUSCULAR | Status: DC | PRN
  Administered 2015-12-19: 10 mg via INTRAVENOUS
  Filled 2015-12-18: qty 2

## 2015-12-18 MED ORDER — POLYETHYLENE GLYCOL 3350 17 G PO PACK: 17.0000 g | PACK | Freq: Every day | ORAL | Status: DC | PRN

## 2015-12-18 MED ORDER — DOCUSATE SODIUM 100 MG PO CAPS
100.0000 mg | ORAL_CAPSULE | Freq: Two times a day (BID) | ORAL | Status: DC
  Administered 2015-12-18 – 2015-12-20 (×5): 100 mg via ORAL
  Filled 2015-12-18 (×5): qty 1

## 2015-12-18 MED ORDER — BACLOFEN 10 MG PO TABS: 10.0000 mg | ORAL_TABLET | Freq: Three times a day (TID) | ORAL | Status: DC

## 2015-12-18 MED ORDER — CEFAZOLIN SODIUM 1-5 GM-% IV SOLN
1.0000 g | Freq: Four times a day (QID) | INTRAVENOUS | Status: AC
Start: 2015-12-18 — End: 2015-12-19
  Administered 2015-12-18 – 2015-12-19 (×3): 1 g via INTRAVENOUS
  Filled 2015-12-18 (×3): qty 50

## 2015-12-18 MED ORDER — ROCURONIUM BROMIDE 50 MG/5ML IV SOLN
INTRAVENOUS | Status: AC
  Filled 2015-12-18: qty 2

## 2015-12-18 MED ORDER — STERILE WATER FOR INJECTION IJ SOLN
INTRAMUSCULAR | Status: AC
  Filled 2015-12-18: qty 10

## 2015-12-18 MED ORDER — HYDROCODONE-ACETAMINOPHEN 10-325 MG PO TABS: 1.0000 | ORAL_TABLET | Freq: Four times a day (QID) | ORAL | Status: DC | PRN

## 2015-12-18 MED ORDER — HYDROCODONE-ACETAMINOPHEN 10-325 MG PO TABS
1.0000 | ORAL_TABLET | ORAL | Status: DC | PRN
  Administered 2015-12-18: 2 via ORAL
  Administered 2015-12-18: 1 via ORAL
  Administered 2015-12-19 – 2015-12-20 (×2): 2 via ORAL
  Filled 2015-12-18: qty 2
  Filled 2015-12-18: qty 1
  Filled 2015-12-18 (×2): qty 2

## 2015-12-18 MED ORDER — VITAMIN D3 25 MCG (1000 UNIT) PO TABS
1000.0000 [IU] | ORAL_TABLET | Freq: Every day | ORAL | Status: DC
  Administered 2015-12-18 – 2015-12-20 (×3): 1000 [IU] via ORAL
  Filled 2015-12-18 (×6): qty 1

## 2015-12-18 MED ORDER — SUGAMMADEX SODIUM 200 MG/2ML IV SOLN
INTRAVENOUS | Status: AC
  Filled 2015-12-18: qty 2

## 2015-12-18 MED ORDER — HYDROMORPHONE HCL 1 MG/ML IJ SOLN
0.5000 mg | INTRAMUSCULAR | Status: DC | PRN
  Administered 2015-12-19 (×3): 0.5 mg via INTRAVENOUS
  Filled 2015-12-18 (×3): qty 1

## 2015-12-18 MED ORDER — TRIAMCINOLONE ACETONIDE 55 MCG/ACT NA AERO: 2.0000 | INHALATION_SPRAY | Freq: Every day | NASAL | Status: DC | PRN

## 2015-12-18 MED ORDER — METOCLOPRAMIDE HCL 5 MG PO TABS: 5.0000 mg | ORAL_TABLET | Freq: Three times a day (TID) | ORAL | Status: DC | PRN

## 2015-12-18 MED ORDER — POTASSIUM CHLORIDE IN NACL 20-0.45 MEQ/L-% IV SOLN
INTRAVENOUS | Status: DC
  Administered 2015-12-18 – 2015-12-19 (×2): via INTRAVENOUS
  Filled 2015-12-18 (×6): qty 1000

## 2015-12-18 MED ORDER — LORATADINE 10 MG PO TABS
10.0000 mg | ORAL_TABLET | Freq: Every day | ORAL | Status: DC
  Administered 2015-12-18 – 2015-12-20 (×3): 10 mg via ORAL
  Filled 2015-12-18 (×3): qty 1

## 2015-12-18 MED ORDER — OXYBUTYNIN CHLORIDE 5 MG PO TABS
5.0000 mg | ORAL_TABLET | Freq: Two times a day (BID) | ORAL | Status: DC
  Administered 2015-12-18 – 2015-12-20 (×4): 5 mg via ORAL
  Filled 2015-12-18 (×5): qty 1

## 2015-12-18 MED ORDER — LACTATED RINGERS IV SOLN
INTRAVENOUS | Status: DC
  Administered 2015-12-18 (×2): via INTRAVENOUS

## 2015-12-18 MED ORDER — BISACODYL 10 MG RE SUPP: 10.0000 mg | Freq: Every day | RECTAL | Status: DC | PRN

## 2015-12-18 MED ORDER — LEVOTHYROXINE SODIUM 50 MCG PO TABS
50.0000 ug | ORAL_TABLET | Freq: Every day | ORAL | Status: DC
  Administered 2015-12-19 – 2015-12-20 (×2): 50 ug via ORAL
  Filled 2015-12-18 (×3): qty 1

## 2015-12-18 MED ORDER — MENTHOL 3 MG MT LOZG
1.0000 | LOZENGE | OROMUCOSAL | Status: DC | PRN
Start: 2015-12-18 — End: 2015-12-20

## 2015-12-18 MED ORDER — GLYCOPYRROLATE 0.2 MG/ML IJ SOLN
INTRAMUSCULAR | Status: AC
  Filled 2015-12-18: qty 4

## 2015-12-18 MED ORDER — ALBUTEROL SULFATE (2.5 MG/3ML) 0.083% IN NEBU: 3.0000 mL | INHALATION_SOLUTION | Freq: Four times a day (QID) | RESPIRATORY_TRACT | Status: DC | PRN

## 2015-12-18 MED ORDER — SENNA 8.6 MG PO TABS
1.0000 | ORAL_TABLET | Freq: Two times a day (BID) | ORAL | Status: DC
  Administered 2015-12-18 – 2015-12-20 (×5): 8.6 mg via ORAL
  Filled 2015-12-18 (×5): qty 1

## 2015-12-18 MED ORDER — SENNA-DOCUSATE SODIUM 8.6-50 MG PO TABS: 2.0000 | ORAL_TABLET | Freq: Every day | ORAL | Status: DC

## 2015-12-18 MED ORDER — MOMETASONE FURO-FORMOTEROL FUM 100-5 MCG/ACT IN AERO
2.0000 | INHALATION_SPRAY | Freq: Two times a day (BID) | RESPIRATORY_TRACT | Status: DC
  Administered 2015-12-19 – 2015-12-20 (×2): 2 via RESPIRATORY_TRACT
  Filled 2015-12-18: qty 8.8

## 2015-12-18 MED ORDER — INSULIN ASPART 100 UNIT/ML ~~LOC~~ SOLN
0.0000 [IU] | Freq: Three times a day (TID) | SUBCUTANEOUS | Status: DC
  Administered 2015-12-19 (×3): 3 [IU] via SUBCUTANEOUS
  Administered 2015-12-20 (×2): 2 [IU] via SUBCUTANEOUS

## 2015-12-18 MED ORDER — PROPOFOL 10 MG/ML IV BOLUS
INTRAVENOUS | Status: DC | PRN
  Administered 2015-12-18: 100 mg via INTRAVENOUS

## 2015-12-18 MED ORDER — POTASSIUM 95 MG PO TABS: 1.0000 | ORAL_TABLET | ORAL | Status: DC

## 2015-12-18 MED ORDER — ACETAMINOPHEN 650 MG RE SUPP: 650.0000 mg | Freq: Four times a day (QID) | RECTAL | Status: DC | PRN

## 2015-12-18 MED ORDER — METHOCARBAMOL 500 MG PO TABS
500.0000 mg | ORAL_TABLET | Freq: Four times a day (QID) | ORAL | Status: DC | PRN
  Administered 2015-12-18 – 2015-12-19 (×2): 500 mg via ORAL
  Filled 2015-12-18 (×2): qty 1

## 2015-12-18 MED ORDER — PHENOL 1.4 % MT LIQD: 1.0000 | OROMUCOSAL | Status: DC | PRN

## 2015-12-18 MED ORDER — PRAVASTATIN SODIUM 20 MG PO TABS
20.0000 mg | ORAL_TABLET | Freq: Every day | ORAL | Status: DC
  Administered 2015-12-18 – 2015-12-20 (×3): 20 mg via ORAL
  Filled 2015-12-18 (×3): qty 1

## 2015-12-18 MED ORDER — ONDANSETRON HCL 4 MG/2ML IJ SOLN
INTRAMUSCULAR | Status: DC | PRN
  Administered 2015-12-18: 4 mg via INTRAVENOUS

## 2015-12-18 MED ORDER — PHENYLEPHRINE 40 MCG/ML (10ML) SYRINGE FOR IV PUSH (FOR BLOOD PRESSURE SUPPORT)
PREFILLED_SYRINGE | INTRAVENOUS | Status: AC
  Filled 2015-12-18: qty 10

## 2015-12-18 SURGICAL SUPPLY — 63 items
BIT DRILL 5/64X5 DISP (BIT) ×2 IMPLANT
BLADE SAW SGTL MED 73X18.5 STR (BLADE) ×2 IMPLANT
BRUSH FEMORAL CANAL (MISCELLANEOUS) IMPLANT
CAPT SHLDR TOTAL 2 ×2 IMPLANT
CEMENT BONE DEPUY (Cement) ×2 IMPLANT
CLSR STERI-STRIP ANTIMIC 1/2X4 (GAUZE/BANDAGES/DRESSINGS) ×2 IMPLANT
COVER SURGICAL LIGHT HANDLE (MISCELLANEOUS) ×2 IMPLANT
COVER TABLE BACK 60X90 (DRAPES) IMPLANT
DRAPE ORTHO SPLIT 77X108 STRL (DRAPES) ×2
DRAPE PROXIMA HALF (DRAPES) ×2 IMPLANT
DRAPE SURG ORHT 6 SPLT 77X108 (DRAPES) ×2 IMPLANT
DRAPE U-SHAPE 47X51 STRL (DRAPES) ×2 IMPLANT
DRSG MEPILEX BORDER 4X8 (GAUZE/BANDAGES/DRESSINGS) ×2 IMPLANT
DURAPREP 26ML APPLICATOR (WOUND CARE) ×2 IMPLANT
ELECT REM PT RETURN 9FT ADLT (ELECTROSURGICAL) ×2
ELECTRODE REM PT RTRN 9FT ADLT (ELECTROSURGICAL) ×1 IMPLANT
EVACUATOR 1/8 PVC DRAIN (DRAIN) IMPLANT
FACESHIELD WRAPAROUND (MASK) ×2 IMPLANT
GLOVE BIOGEL PI IND STRL 8 (GLOVE) ×1 IMPLANT
GLOVE BIOGEL PI INDICATOR 8 (GLOVE) ×1
GLOVE BIOGEL PI ORTHO PRO SZ8 (GLOVE) ×1
GLOVE ORTHO TXT STRL SZ7.5 (GLOVE) ×2 IMPLANT
GLOVE PI ORTHO PRO STRL SZ8 (GLOVE) ×1 IMPLANT
GLOVE SURG ORTHO 8.0 STRL STRW (GLOVE) ×4 IMPLANT
GOWN STRL REUS W/ TWL LRG LVL3 (GOWN DISPOSABLE) ×1 IMPLANT
GOWN STRL REUS W/ TWL XL LVL3 (GOWN DISPOSABLE) ×1 IMPLANT
GOWN STRL REUS W/TWL 2XL LVL3 (GOWN DISPOSABLE) ×2 IMPLANT
GOWN STRL REUS W/TWL LRG LVL3 (GOWN DISPOSABLE) ×1
GOWN STRL REUS W/TWL XL LVL3 (GOWN DISPOSABLE) ×1
HANDPIECE INTERPULSE COAX TIP (DISPOSABLE) ×1
HOOD PEEL AWAY FACE SHEILD DIS (HOOD) ×2 IMPLANT
KIT BASIN OR (CUSTOM PROCEDURE TRAY) ×2 IMPLANT
KIT ROOM TURNOVER OR (KITS) ×2 IMPLANT
MANIFOLD NEPTUNE II (INSTRUMENTS) ×2 IMPLANT
NEEDLE 1/2 CIR CATGUT .05X1.09 (NEEDLE) IMPLANT
NEEDLE HYPO 25GX1X1/2 BEV (NEEDLE) IMPLANT
NS IRRIG 1000ML POUR BTL (IV SOLUTION) ×2 IMPLANT
PACK SHOULDER (CUSTOM PROCEDURE TRAY) ×2 IMPLANT
PAD ABD 8X10 STRL (GAUZE/BANDAGES/DRESSINGS) ×2 IMPLANT
PAD ARMBOARD 7.5X6 YLW CONV (MISCELLANEOUS) ×4 IMPLANT
SET HNDPC FAN SPRY TIP SCT (DISPOSABLE) ×1 IMPLANT
SLING ARM IMMOBILIZER LRG (SOFTGOODS) IMPLANT
SLING ARM IMMOBILIZER MED (SOFTGOODS) IMPLANT
SMARTMIX MINI TOWER (MISCELLANEOUS) ×2
SPONGE GAUZE 4X4 12PLY STER LF (GAUZE/BANDAGES/DRESSINGS) ×2 IMPLANT
SPONGE LAP 18X18 X RAY DECT (DISPOSABLE) ×2 IMPLANT
SUCTION FRAZIER HANDLE 10FR (MISCELLANEOUS) ×1
SUCTION TUBE FRAZIER 10FR DISP (MISCELLANEOUS) ×1 IMPLANT
SUPPORT WRAP ARM LG (MISCELLANEOUS) ×2 IMPLANT
SUT FIBERWIRE #2 38 REV NDL BL (SUTURE) ×6
SUT MAXBRAID (SUTURE) ×4 IMPLANT
SUT MNCRL AB 4-0 PS2 18 (SUTURE) IMPLANT
SUT VIC AB 0 CT1 27 (SUTURE) ×2
SUT VIC AB 0 CT1 27XBRD ANBCTR (SUTURE) ×1 IMPLANT
SUT VIC AB 2-0 CT1 27 (SUTURE) ×1
SUT VIC AB 2-0 CT1 TAPERPNT 27 (SUTURE) ×1 IMPLANT
SUT VIC AB 3-0 SH 8-18 (SUTURE) ×2 IMPLANT
SUTURE FIBERWR#2 38 REV NDL BL (SUTURE) ×3 IMPLANT
SYR CONTROL 10ML LL (SYRINGE) IMPLANT
TAPE CLOTH SURG 6X10 WHT LF (GAUZE/BANDAGES/DRESSINGS) ×2 IMPLANT
TOWEL OR 17X24 6PK STRL BLUE (TOWEL DISPOSABLE) ×2 IMPLANT
TOWEL OR 17X26 10 PK STRL BLUE (TOWEL DISPOSABLE) ×2 IMPLANT
TOWER SMARTMIX MINI (MISCELLANEOUS) ×1 IMPLANT

## 2015-12-18 NOTE — Anesthesia Procedure Notes (Signed)
Anesthesia Regional Block:  Supraclavicular block  Pre-Anesthetic Checklist: ,,, Correct Patient,, Correct Laterality,, Correct Position,,, risks and benefits discussed,,, pre-op evaluation, post-op pain management  Laterality: Right  Prep: chloraprep       Needles:   Needle Type: Stimulator Needle - 40     Needle Length: 4cm 4 cm Needle Gauge: 24 and 24 G    Additional Needles:  Procedures: ultrasound guided (picture in chart) and nerve stimulator Supraclavicular block  Nerve Stimulator or Paresthesia:  Response: 48 mA,   Additional Responses:   Narrative:  Start time: 12/18/2015 10:01 AM  Performed by: Personally  Anesthesiologist: Lyndle Herrlich

## 2015-12-18 NOTE — Anesthesia Preprocedure Evaluation (Addendum)
Anesthesia Evaluation  Patient identified by MRN, date of birth, ID band Patient awake    Reviewed: Allergy & Precautions, H&P , Patient's Chart, lab work & pertinent test results, reviewed documented beta blocker date and time   Airway Mallampati: II  TM Distance: >3 FB Neck ROM: full    Dental no notable dental hx.    Pulmonary former smoker,    Pulmonary exam normal breath sounds clear to auscultation       Cardiovascular hypertension, + Cardiac Defibrillator  Rhythm:regular Rate:Normal     Neuro/Psych    GI/Hepatic   Endo/Other  diabetes  Renal/GU      Musculoskeletal   Abdominal   Peds  Hematology   Anesthesia Other Findings Diabetes mellitus without complication (HCC)  Severe aortic stenosis 1.2cm2          Chronic airway obstruction, not elsewhere classified     Pain in joint, lower leg       Scoliosis (and kyphoscoliosis), idiopathic     essential hypertension          Unspecified hypothyroidism    Obesity, unspecified       Type II or uspecified type diabetes mellitus without mention of complication, uncontrolled         Anemia, unspecified     Atrial fibrillation    Chronic anticoagulation 09/2015  Xarelto for afib   Dysrhythmia      Shortness of breath dyspnea        Heart murmur    Reproductive/Obstetrics                            Anesthesia Physical Anesthesia Plan  ASA: II  Anesthesia Plan: General and Regional   Post-op Pain Management:    Induction: Intravenous  Airway Management Planned: Oral ETT  Additional Equipment:   Intra-op Plan:   Post-operative Plan: Extubation in OR  Informed Consent: I have reviewed the patients History and Physical, chart, labs and discussed the procedure including the risks, benefits and alternatives for the proposed anesthesia with the patient or authorized representative who has indicated his/her  understanding and acceptance.   Dental Advisory Given and Dental advisory given  Plan Discussed with: CRNA and Surgeon  Anesthesia Plan Comments: (  Discussed general anesthesia, including possible nausea, instrumentation of airway, sore throat,pulmonary aspiration, etc. I asked if the were any outstanding questions, or  concerns before we proceeded. )        Anesthesia Quick Evaluation

## 2015-12-18 NOTE — H&P (Signed)
PREOPERATIVE H&P  Chief Complaint: djd right shoulder  HPI: Patricia [name] is a 80 y.o. female who presents for preoperative history and physical with a diagnosis of djd right shoulder. Symptoms are rated as moderate to severe, and have been worsening.  This is significantly impairing activities of daily living.  She has elected for surgical management.   She has failed injections, activity modification, anti-inflammatories, and assistive devices.  Preoperative X-rays demonstrate end stage degenerative changes with osteophyte formation, loss of joint space, subchondral sclerosis.  She's had previous left shoulder replacement as well.  Past Medical History  Diagnosis Date  . Allergy   . Arthritis   . Asthma   . Diabetes mellitus without complication ([place])   . Severe aortic stenosis   . Diarrhea   . Urge incontinence   . Lumbago   . Edema   . Cervicalgia   . Chronic airway obstruction, not elsewhere classified   . Pain in joint, lower leg   . Routine gynecological examination   . Scoliosis (and kyphoscoliosis), idiopathic   . Encounter for long-term (current) use of other medications   . Unspecified essential hypertension   . Other and unspecified hyperlipidemia   . Benign neoplasm of colon   . Carpal tunnel syndrome   . Unspecified hemorrhoids without mention of complication   . Diffuse cystic mastopathy   . Unspecified hypothyroidism   . Obesity, unspecified   . Cellulitis and abscess of finger, unspecified   . Other psoriasis   . Type II or unspecified type diabetes mellitus without mention of complication, uncontrolled   . Cerumen impaction   . Neck pain 05/23/2015  . Anemia, unspecified   . Atrial fibrillation status post cardioversion [facility]) 09/2015    s/p TEE/DCCV>>SR on amio  . Chronic anticoagulation 09/2015    Xarelto for afib, CHADS2VASC=5  . PONV (postoperative nausea and vomiting)   . Dysrhythmia   . Heart murmur   . Shortness of breath dyspnea     with  exertion   Past Surgical History  Procedure Laterality Date  . Breast surgery      right breast 3 surgeries [id number]  . Cholecystectomy  1992    DR BOWMAN  . Colon surgery      2004,2007,2013.  3 times colon surgieres  . Abdominal hysterectomy  1987    complete  . Vaginal cyst removed  1967  . 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 Foot surgery  1989  . Excise le mandibular lymph node t      DR C. NEWMAN  . Left shoulder arthroscopy    . Neuroplasty / transposition median nerve at carpal tunnel bilateral  05/2003  . Tee without cardioversion N/A 10/19/2015    Procedure: Transesophageal Echocardiogram (TEE) ;  Surgeon: Lelon Perla, MD;  Location: [facility] ENDOSCOPY;  Service: Cardiovascular;  Laterality: N/A;  . Cardioversion N/A 10/19/2015    Procedure: CARDIOVERSION;  Surgeon: Lelon Perla, MD;  Location[facility] ENDOSCOPY;  Service: Cardiovascular;  Laterality: N/A;  . Trigger finger release Left    Social History   Social History  . Marital Status: Divorced    Spouse Name: N/A  . Number of Children: 1  . Years of Education: N/A   Occupational History  . Retired Market researcher for [employer] History Main Topics  . Smoking status: Former Smoker    Quit date: 09/07/1989  . Smokeless tobacco: Never Used  . Alcohol Use: No  . Drug Use: No  .  Sexual Activity: No   Other Topics Concern  . None   Social History Narrative   Her daughter & 3 grandchildren live in the area.     Family History  Problem Relation Age of Onset  . Diabetes Father   . Stroke Father   . Heart disease Father   . Cancer Sister   . Heart disease Brother   . Asthma Sister   . Arthritis Sister    Allergies  Allergen Reactions  . Codeine Other (See Comments)    "spaces her out, dizziness, can't walk well"  . Vesicare [Solifenacin] Other (See Comments)    unknown   Prior to Admission medications   Medication Sig Start Date End Date Taking? Authorizing Provider  ADVAIR  DISKUS 100-50 MCG/DOSE AEPB Inhale 1 puff two times  daily for breathing 11/05/15  Yes Estill Dooms, MD  albuterol (PROVENTIL HFA;VENTOLIN HFA) 108 (90 BASE) MCG/ACT inhaler Inhale 2 puffs into the lungs every 6 (six) hours as needed for wheezing or shortness of breath. 01/24/15  Yes Estill Dooms, MD  amiodarone (PACERONE) 200 MG tablet Take one tablet daily by mouth 11/20/15  Yes Rhonda G Barrett, PA-C  cholecalciferol (VITAMIN D) 1000 units tablet One daily for vitamin D supplement 11/27/15  Yes Estill Dooms, MD  COMFORT LANCETS MISC Use daily to get blood for glucometer 03/08/14  Yes Estill Dooms, MD  diltiazem (CARDIZEM CD) 180 MG 24 hr capsule Take 1 capsule by mouth  once a day to control blood pressure 11/20/15  Yes Rhonda G Barrett, PA-C  doxazosin (CARDURA) 4 MG tablet Take one tablet by mouth once daily to help control blood pressure 11/06/15  Yes Estill Dooms, MD  furosemide (LASIX) 40 MG tablet Take 1 tablet (40 mg total) by mouth See admin instructions. Take 1 tablet on Monday/Wednesday and Friday. May take additional dose as needed for increased swelling or weight gain >3 lbs in 24 hr 10/21/15  Yes Almyra Deforest, PA  glimepiride (AMARYL) 2 MG tablet Take 1 tablet by mouth at  breakfast to control  Glucose 11/26/15  Yes Estill Dooms, MD  glucose blood (BAYER CONTOUR TEST) test strip Use daily too check glucose 03/08/14  Yes Estill Dooms, MD  levothyroxine (SYNTHROID, LEVOTHROID) 50 MCG tablet Take 1 tablet by mouth  daily for thyroid  supplement 08/27/15  Yes Estill Dooms, MD  lisinopril (PRINIVIL,ZESTRIL) 20 MG tablet Take 1 tablet by mouth once daily for blood pressure 11/26/15  Yes Estill Dooms, MD  metFORMIN (GLUCOPHAGE) 500 MG tablet Take 500 mg by mouth 2 (two) times daily with a meal.   Yes Historical Provider, MD  oxybutynin (DITROPAN) 5 MG tablet One twice daily to help control bladder Patient taking differently: Take 5 mg by mouth 2 (two) times daily. One twice daily to help  control bladder 09/26/15  Yes Estill Dooms, MD  Potassium 95 MG TABS Take 1 tablet by mouth 3 (three) times a week. Pt only takes when taking LASIX   Yes Historical Provider, MD  pravastatin (PRAVACHOL) 20 MG tablet Take 1 tablet by mouth  daily 11/20/15  Yes Estill Dooms, MD  rivaroxaban (XARELTO) 20 MG TABS tablet Take 1 tablet (20 mg total) by mouth daily with supper. 11/19/15  Yes [name] M Martinique, MD  triamcinolone (NASACORT AQ) 55 MCG/ACT AERO nasal inhaler Place 2 sprays into the nose daily. Patient taking differently: Place 2 sprays into the nose daily as needed.  11/27/15  Yes Estill Dooms, MD  cetirizine (ZYRTEC) 10 MG tablet Take 10 mg by mouth daily as needed for allergies.     Historical Provider, MD     Positive ROS: All other systems have been reviewed and were otherwise negative with the exception of those mentioned in the HPI and as above.  Physical Exam: General: Alert, no acute distress Cardiovascular: No pedal edema Respiratory: No cyanosis, no use of accessory musculature GI: No organomegaly, abdomen is soft and non-tender Skin: No lesions in the area of chief complaint Neurologic: Sensation intact distally Psychiatric: Patient is competent for consent with normal mood and affect Lymphatic: No axillary or cervical lymphadenopathy  MUSCULOSKELETAL: Right shoulder motion is 0-70. This is limited by pain with positive crepitance. No pain over the acromioclavicular joint.  Assessment: djd right shoulder   Plan: Plan for Procedure(s): TOTAL SHOULDER ARTHROPLASTY  The risks benefits and alternatives were discussed with the patient including but not limited to the risks of nonoperative treatment, versus surgical intervention including infection, bleeding, nerve injury,  blood clots, cardiopulmonary complications, morbidity, mortality, among others, and they were willing to proceed.   Johnny Bridge, MD Cell [phone]   12/18/2015 10:16 AM

## 2015-12-18 NOTE — Progress Notes (Signed)
Orthopedic Tech Progress Note Patient Details:  Patricia [name] [birth year] [id number]  Ortho Devices Type of Ortho Device: Sling immobilizer Ortho Device/Splint Interventions: Application   [place] Pink 12/18/2015, 5:08 PM

## 2015-12-18 NOTE — Op Note (Signed)
12/18/2015  1:20 PM  PATIENT:  Patricia [name]    PRE-OPERATIVE DIAGNOSIS:  Right shoulder primary localized osteoarthritis  POST-OPERATIVE DIAGNOSIS:  Same  PROCEDURE:  Right TOTAL SHOULDER ARTHROPLASTY  SURGEON:  Johnny Bridge, MD  PHYSICIAN ASSISTANT: Joya Gaskins, OPA-C, present and scrubbed throughout the case, critical for completion in a timely fashion, and for retraction, instrumentation, and closure.  ANESTHESIA:   General  PREOPERATIVE INDICATIONS:  Patricia [name] is a  80 y.o. female with a diagnosis of djd right shoulder who failed conservative measures and elected for surgical management.    The risks benefits and alternatives were discussed with the patient preoperatively including but not limited to the risks of infection, bleeding, nerve injury, cardiopulmonary complications, the need for revision surgery, dislocation, loosening, incomplete relief of pain, among others, and the patient was willing to proceed.   OPERATIVE IMPLANTS: Biomet size 9 mini press-fit humeral stem, size 42+18 Versa-dial humeral head, set in the E position with increased coverage posteriorly, with a medium cemented glenoid polyethylene 3 peg implant with a central regenerex noncemented post.   OPERATIVE FINDINGS: Advanced glenohumeral osteoarthritis involving the glenoid and the humeral head with substantial osteophyte formation inferiorly. There was fairly extreme tightness of the shoulder, external rotation to neutral at best during examination under anesthesia. The subscapularis was somewhat contracted.   OPERATIVE PROCEDURE: The patient was brought to the operating room and placed in the supine position. General anesthesia was administered. IV antibiotics were given.  The upper extremity was prepped and draped in usual sterile fashion. The patient was in a beachchair position with all bony prominences padded.   Time out was performed and a deltopectoral approach was carried out. The biceps  tendon was tenodesed to the pectoralis tendon. The subscapularis was released, tagging it with a #2 MaxBraid, leaving a cuff of tendon for repair.   The inferior osteophyte was removed, and release of the capsule off of the humeral side was completed. The head was dislocated, and I reamed sequentially. I placed the humeral cutting guide at 30 of retroversion, and then pinned this into place, and made my humeral neck cut. This was at the appropriate level. I actually made the cut twice because my first cut was somewhat to conservative and access to the glenoid was going to be challenging.  I then placed deep retractors and exposed the glenoid. I excised the labrum circumferentially, taking care to protect the axillary nerve inferiorly.   I then placed a guidewire into the center position, controlling appropriate version and inclination. I then reamed over the guidewire with the small reamer, and was satisfied with the preparation. I preserved the subchondral bone in order to maximize the strength and minimize the risk for subsequent subsidence.   I then drilled the central hole for the regenerex peg, and then placed the guide, and then drilled the 3 peripheral peg holes. I had excellent bony circumferential contact. The anterior peg was bicortical, and the remaining pegs including the regenerates post was contained within the bolt. I aimed the initial guidewire slightly posterior, in order to remove more anterior bone and optimize version.  I then cleaned the glenoid, irrigated it copiously, and then dried it and cemented the prosthesis into place. Excellent seating was achieved. I had full exposure. The cement cured while I turned my attention to the humeral side.   I sequentially broached, up to the selected size, with the broach set at 30 of retroversion. I then placed the real stem. I  trialed with multiple heads, and the above-named component was selected. Increased posterior coverage improved the  coverage. The soft tissue tension was appropriate.   I then impacted the real humeral head into place, reduced the head, and irrigated copiously. Excellent stability and range of motion was achieved. I repaired the subscapularis with 4 #2 MaxBraid, as well as the rotator interval, and irrigated copiously once more. The inferior aspect of the subscapularis was not great quality, and the repair was fairly tight, but I did achieve excellent tendon to bone repair. The subcutaneous tissue was closed with Vicryl including the deltopectoral fascia.   The skin was closed with Steri-Strips and sterile gauze was applied. She had a preoperative nerve block. She tolerated the procedure well and there were no complications.

## 2015-12-18 NOTE — Progress Notes (Signed)
Utilization review completed.  

## 2015-12-18 NOTE — Transfer of Care (Signed)
Immediate Anesthesia Transfer of Care Note  Patient: Patricia [name]  Procedure(s) Performed: Procedure(s): TOTAL SHOULDER ARTHROPLASTY (Right)  Patient Location: PACU  Anesthesia Type:General  Level of Consciousness: awake, alert , oriented and patient cooperative  Airway & Oxygen Therapy: Patient Spontanous Breathing and Patient connected to nasal cannula oxygen  Post-op Assessment: Report given to RN and Post -op Vital signs reviewed and stable  Post vital signs: Reviewed and stable  Last Vitals:  Filed Vitals:   12/18/15 1050 12/18/15 1100  BP:    Pulse: 73 72  Temp:    Resp: 23 20    Complications: No apparent anesthesia complications

## 2015-12-19 ENCOUNTER — Encounter (HOSPITAL_COMMUNITY): Payer: Self-pay | Admitting: Orthopedic Surgery

## 2015-12-19 LAB — BASIC METABOLIC PANEL
Anion gap: 12 (ref 5–15)
BUN: 15 mg/dL (ref 6–20)
CO2: 28 mmol/L (ref 22–32)
Calcium: 9 mg/dL (ref 8.9–10.3)
Chloride: 97 mmol/L — ABNORMAL LOW (ref 101–111)
Creatinine, Ser: 0.96 mg/dL (ref 0.44–1.00)
GFR calc Af Amer: >60 mL/min (ref >60)
GFR calc non Af Amer: 56 mL/min — ABNORMAL LOW (ref >60)
Glucose, Bld: 189 mg/dL — ABNORMAL HIGH (ref 65–99)
Potassium: 4.7 mmol/L (ref 3.5–5.1)
Sodium: 137 mmol/L (ref 135–145)

## 2015-12-19 LAB — CBC
HCT: 30.6 % — ABNORMAL LOW (ref 36.0–46.0)
Hemoglobin: 9.6 g/dL — ABNORMAL LOW (ref 12.0–15.0)
MCH: 27.4 pg (ref 26.0–34.0)
MCHC: 31.4 g/dL (ref 30.0–36.0)
MCV: 87.4 fL (ref 78.0–100.0)
Platelets: 206 10*3/uL (ref 150–400)
RBC: 3.5 MIL/uL — ABNORMAL LOW (ref 3.87–5.11)
RDW: 17.3 % — ABNORMAL HIGH (ref 11.5–15.5)
WBC: 7.1 10*3/uL (ref 4.0–10.5)

## 2015-12-19 LAB — GLUCOSE, CAPILLARY
Glucose-Capillary: 165 mg/dL — ABNORMAL HIGH (ref 65–99)
Glucose-Capillary: 173 mg/dL — ABNORMAL HIGH (ref 65–99)
Glucose-Capillary: 189 mg/dL — ABNORMAL HIGH (ref 65–99)

## 2015-12-19 MED ORDER — HYDROCODONE-ACETAMINOPHEN 5-325 MG PO TABS
1.0000 | ORAL_TABLET | ORAL | Status: DC | PRN
  Administered 2015-12-19 – 2015-12-20 (×4): 1 via ORAL
  Filled 2015-12-19 (×4): qty 1

## 2015-12-19 MED ORDER — TRAMADOL HCL 50 MG PO TABS
50.0000 mg | ORAL_TABLET | Freq: Four times a day (QID) | ORAL | Status: DC | PRN
  Administered 2015-12-19 – 2015-12-20 (×3): 50 mg via ORAL
  Filled 2015-12-19 (×3): qty 1

## 2015-12-19 NOTE — Discharge Instructions (Signed)
Diet: As you were doing prior to hospitalization   Shower:  May shower but keep the wounds dry, use an occlusive plastic wrap, NO SOAKING IN TUB.  If the bandage gets wet, change with a clean dry gauze.  If you have a splint on, leave the splint in place and keep the splint dry with a plastic bag.  Dressing:  You may change your dressing 3-5 days after surgery, unless you have a splint.  If you have a splint, then just leave the splint in place and we will change your bandages during your first follow-up appointment.    If you had hand or foot surgery, we will plan to remove your stitches in about 2 weeks in the office.  For all other surgeries, there are sticky tapes (steri-strips) on your wounds and all the stitches are absorbable.  Leave the steri-strips in place when changing your dressings, they will peel off with time, usually 2-3 weeks.  Activity:  Increase activity slowly as tolerated, but follow the weight bearing instructions below.  The rules on driving is that you can not be taking narcotics while you drive, and you must feel in control of the vehicle.    Weight Bearing:   Sling at all times.    To prevent constipation: you may use a stool softener such as -  Colace (over the counter) 100 mg by mouth twice a day  Drink plenty of fluids (prune juice may be helpful) and high fiber foods Miralax (over the counter) for constipation as needed.    Itching:  If you experience itching with your medications, try taking only a single pain pill, or even half a pain pill at a time.  You may take up to 10 pain pills per day, and you can also use benadryl over the counter for itching or also to help with sleep.   Precautions:  If you experience chest pain or shortness of breath - call 911 immediately for transfer to the hospital emergency department!!  If you develop a fever greater that 101 F, purulent drainage from wound, increased redness or drainage from wound, or calf pain -- Call the office  at [phone]                                                Follow- Up Appointment:  Please call for an appointment to be seen in 2 weeks [place] - [phone]     Information on my medicine - XARELTO (Rivaroxaban)  This medication education was reviewed with me or my healthcare representative as part of my discharge preparation.  The pharmacist that spoke with me during my hospital stay was:  Duayne Cal, [facility]  Why was Xarelto prescribed for you? Xarelto was prescribed for you to reduce the risk of a blood clot forming that can cause a stroke if you have a medical condition called atrial fibrillation (a type of irregular heartbeat).  What do you need to know about xarelto ? Take your Xarelto ONCE DAILY at the same time every day with your evening meal. If you have difficulty swallowing the tablet whole, you may crush it and mix in applesauce just prior to taking your dose.  Take Xarelto exactly as prescribed by your doctor and DO NOT stop taking Xarelto without talking to the doctor who prescribed the medication.  Stopping without other  stroke prevention medication to take the place of Xarelto may increase your risk of developing a clot that causes a stroke.  Refill your prescription before you run out.  After discharge, you should have regular check-up appointments with your healthcare provider that is prescribing your Xarelto.  In the future your dose may need to be changed if your kidney function or weight changes by a significant amount.  What do you do if you miss a dose? If you are taking Xarelto ONCE DAILY and you miss a dose, take it as soon as you remember on the same day then continue your regularly scheduled once daily regimen the next day. Do not take two doses of Xarelto at the same time or on the same day.   Important Safety Information A possible side effect of Xarelto is bleeding. You should call your healthcare provider right away if you experience  any of the following: ? Bleeding from an injury or your nose that does not stop. ? Unusual colored urine (red or dark brown) or unusual colored stools (red or black). ? Unusual bruising for unknown reasons. ? A serious fall or if you hit your head (even if there is no bleeding).  Some medicines may interact with Xarelto and might increase your risk of bleeding while on Xarelto. To help avoid this, consult your healthcare provider or pharmacist prior to using any new prescription or non-prescription medications, including herbals, vitamins, non-steroidal anti-inflammatory drugs (NSAIDs) and supplements.  This website has more information on Xarelto: [email or web address]

## 2015-12-19 NOTE — Evaluation (Addendum)
Physical Therapy Evaluation Patient Details Name: Patricia [name] MRN: [id number] DOB: 1936-07-01 Today's Date: 12/19/2015   History of Present Illness  80 y.o. female s/p R total shoulder arthroplasty. PMH significant for HTN, arthritis, DM type II, anemia, severe aortic stenosis, atrial fibrillation, dysrhythmia, heart murmur, dyspnea, urge incontinence, LE joint pain, and L shoulder arthoscopy.  Clinical Impression  Pt is limited by pain and nausea this AM. She was able to get up and move around the room with min hand held assist.  She has a cane to use at home, so PT plans to bring cane to practice gait and curb step next session.  In pt's current condition, I would advise that she have someone with her for the first 2-3 days to make sure everything is ok at discharge.  Pt will report that she has no one, however, daughter in room stating family can take off work to be with her if needed.  PT to follow acutely for deficits listed below.       Follow Up Recommendations Supervision/Assistance - 24 hour;No PT follow up (for the first 2-3 days at home)    Equipment Recommendations  None recommended by PT    Recommendations for Other Services   NA    Precautions / Restrictions Precautions Precautions: Shoulder Type of Shoulder Precautions: Conservative protocol Shoulder Interventions: Shoulder sling/immobilizer;Off for dressing/bathing/exercises Precaution Booklet Issued: Yes (comment) Precaution Comments: NO shoulder P/AROM; AROM at elbow, wrist and hand allowed Required Braces or Orthoses: Sling Restrictions Weight Bearing Restrictions: Yes RUE Weight Bearing: Non weight bearing      Mobility  Bed Mobility Overal bed mobility: Needs Assistance Bed Mobility: Rolling;Sidelying to Sit;Sit to Sidelying Rolling: Min assist Sidelying to sit: Mod assist     Sit to sidelying: Mod assist General bed mobility comments: Pt needed assist at trunk to roll and to come up to sitting and  lift legs back into bed from sitting to side lying.  Practiced with HOB flat and no rails simulating her bed at home  Transfers Overall transfer level: Needs assistance Equipment used: 1 person hand held assist Transfers: Sit to/from Stand Sit to Stand: Min assist         General transfer comment: Min assist to support trunk for balance.  Pt a bit wobbly in her feet  Ambulation/Gait Ambulation/Gait assistance: Min assist Ambulation Distance (Feet): 25 Feet Assistive device: 1 person hand held assist Gait Pattern/deviations: Step-through pattern;Staggering left;Staggering right Gait velocity: decreased   General Gait Details: mildly staggering gait pattern with need for free hand to stabilize on therapist or furniture while walking around the room.          Balance Overall balance assessment: Needs assistance Sitting-balance support: Feet supported;Single extremity supported Sitting balance-Leahy Scale: Fair     Standing balance support: Single extremity supported Standing balance-Leahy Scale: Poor                               Pertinent Vitals/Pain Pain Assessment: 0-10 Pain Score: 7  Pain Location: right shoulder and arm Pain Descriptors / Indicators: Grimacing;Guarding;Aching;Burning Pain Intervention(s): Limited activity within patient's tolerance;Monitored during session;Repositioned;Ice applied;Premedicated before session    [place] expects to be discharged to:: Private residence Living Arrangements: Alone Available Help at Discharge: Family;Available PRN/intermittently Type of Home: House Home Access: Level entry (in back)     Home Layout: One level Home Equipment: Cane - single point;Grab bars - tub/shower;Hand held  shower head;Shower seat - built in Additional Comments: Daughter and grandson will be "in and out" daily    Prior Function Level of Independence: Independent         Comments: still drives     Hand  Dominance   Dominant Hand: Right    Extremity/Trunk Assessment   Upper Extremity Assessment: Defer to OT evaluation           Lower Extremity Assessment: Generalized weakness      Cervical / Trunk Assessment: Normal  Communication   Communication: No difficulties  Cognition Arousal/Alertness: Lethargic;Suspect due to medications Behavior During Therapy: Flat affect Overall Cognitive Status: Within Functional Limits for tasks assessed                               Assessment/Plan    PT Assessment Patient needs continued PT services  PT Diagnosis Difficulty walking;Abnormality of gait;Generalized weakness;Acute pain   PT Problem List Decreased strength;Decreased range of motion;Decreased activity tolerance;Decreased balance;Decreased mobility;Decreased knowledge of use of DME;Pain  PT Treatment Interventions DME instruction;Gait training;Stair training;Functional mobility training;Therapeutic activities;Therapeutic exercise;Balance training;Neuromuscular re-education;Patient/family education;Manual techniques;Modalities   PT Goals (Current goals can be found in the Care Plan section) Acute Rehab PT Goals Patient Stated Goal: to increase right shoulder function/ROM PT Goal Formulation: With patient Time For Goal Achievement: 12/26/15 Potential to Achieve Goals: Good    Frequency Min 5X/week           End of Session Equipment Utilized During Treatment: Other (comment) (right arm sling) Activity Tolerance: Patient limited by pain;Other (comment) (limited by nausea) Patient left: in bed;with call bell/phone within reach;with family/visitor present Nurse Communication: Mobility status         Time: 7048-8891 PT Time Calculation (min) (ACUTE ONLY): 20 min   Charges:   PT Evaluation $PT Eval Moderate Complexity: 1 Procedure          Bo Teicher B. [place], [place], DPT [phone]   12/19/2015, 4:12 PM

## 2015-12-19 NOTE — Progress Notes (Signed)
Occupational Therapy Evaluation Patient Details Name: Patricia [name] MRN: [id number] DOB: Nov 29, 1935 Today's Date: 12/19/2015    History of Present Illness 79 y.o. female s/p R total shoulder arthroplasty. PMH significant for HTN, arthritis, DM type II, anemia, severe aortic stenosis, atrial fibrillation, dysrhythmia, heart murmur, dyspnea, urge incontinence, LE joint pain, and L shoulder arthoscopy.   Clinical Impression   Occupational Therapy evaluation limited by pt's nausea/emesis, decreased level of arousal (suspect due to pain medication) and increased pain. PTA, pt was independent with ADLs and mobility. Pt currently requires mod-max assist for ADLs and min assist for transers and ambulation. Educated pt on sling wear protocol, positioning for rest and sleep, and began education on compensatory strategies for dressing. Pt plans to d/c home with intermittent assistance from family members - will need to confirm this. Pt will benefit from continued acute OT to increase independence and safety with ADLs and mobility while adhering to shoulder precautions. Recommending HHOT upon discharge.    Follow Up Recommendations  Home health OT;Supervision/Assistance - 24 hour    Equipment Recommendations  None recommended by OT    Recommendations for Other Services       Precautions / Restrictions Precautions Precautions: Shoulder Type of Shoulder Precautions: Conservative protocol Shoulder Interventions: Shoulder sling/immobilizer;Off for dressing/bathing/exercises Precaution Booklet Issued: Yes (comment) Precaution Comments: NO shoulder P/AROM; AROM at elbow, wrist and hand allowed Required Braces or Orthoses: Sling Restrictions Weight Bearing Restrictions: Yes RUE Weight Bearing: Non weight bearing      Mobility Bed Mobility               General bed mobility comments: Pt up in chair on OT arrival  Transfers Overall transfer level: Needs assistance Equipment used: 1  person hand held assist Transfers: Sit to/from Stand Sit to Stand: Min assist         General transfer comment: Min assist/hand held assist to stand and ambulate. Verbal cues for safe hand placement on seated surfaces.     Balance Overall balance assessment: Needs assistance Sitting-balance support: No upper extremity supported;Feet supported Sitting balance-Leahy Scale: Fair     Standing balance support: Single extremity supported;During functional activity Standing balance-Leahy Scale: Poor Standing balance comment: Pt with some staggering steps and required hand held assist for ambulation to/from bathroom.                            ADL Overall ADL's : Needs assistance/impaired     Grooming: Wash/dry hands;Minimal assistance;Standing Grooming Details (indicate cue type and reason): Hand held assist to maintain balance         Upper Body Dressing : Maximal assistance;Cueing for compensatory techniques;Sitting Upper Body Dressing Details (indicate cue type and reason): To don gown and sling     Toilet Transfer: Minimal assistance;Cueing for safety;Ambulation;Comfort height toilet Toilet Transfer Details (indicate cue type and reason): Hand held asssist to maintain balance. Cues to for safe hand placement  Toileting- Clothing Manipulation and Hygiene: Minimal assistance;Sit to/from stand       Functional mobility during ADLs: Minimal assistance General ADL Comments: Reviewed sling wear protocol, proper positioning of RUE for sleep or rest, and pillow placement. Practiced donning/doffing sling and gown with mix assist due to pt's level of arousal and pain.     Vision Vision Assessment?: No apparent visual deficits   Perception     Praxis      Pertinent Vitals/Pain Pain Assessment: 0-10 Pain Score: 7  Pain Location:  R shoulder Pain Descriptors / Indicators: Grimacing;Guarding;Sore Pain Intervention(s): Limited activity within patient's  tolerance;Monitored during session;Repositioned;Ice applied;Premedicated before session     Hand Dominance Right   Extremity/Trunk Assessment Upper Extremity Assessment Upper Extremity Assessment: RUE deficits/detail RUE Deficits / Details: decreased ROM and strength as expected post op   Lower Extremity Assessment Lower Extremity Assessment: Generalized weakness   Cervical / Trunk Assessment Cervical / Trunk Assessment: Normal   Communication Communication Communication: No difficulties   Cognition Arousal/Alertness: Lethargic;Suspect due to medications Behavior During Therapy: Flat affect Overall Cognitive Status: Within Functional Limits for tasks assessed                     General Comments       Exercises       Shoulder Instructions      Home Living Family/patient expects to be discharged to:: Private residence Living Arrangements: Alone Available Help at Discharge: Family;Available PRN/intermittently Type of Home: House Home Access: Level entry (in back)     Home Layout: One level     Bathroom Shower/Tub: Walk-in shower;Door   [employer] Toilet: Handicapped height Bathroom Accessibility: Yes How Accessible: Accessible via walker Home Equipment: Cane - single point;Grab bars - tub/shower;Hand held shower head;Shower seat - built in   Additional Comments: Daughter and grandson will be "in and out" daily      Prior Functioning/Environment Level of Independence: Independent             OT Diagnosis: Generalized weakness;Acute pain   OT Problem List: Decreased strength;Decreased range of motion;Decreased activity tolerance;Impaired balance (sitting and/or standing);Decreased coordination;Decreased safety awareness;Decreased knowledge of use of DME or AE;Decreased knowledge of precautions;Obesity;Impaired UE functional use;Pain   OT Treatment/Interventions: Self-care/ADL training;Therapeutic exercise;Energy conservation;DME and/or AE  instruction;Therapeutic activities;Patient/family education;Balance training    OT Goals(Current goals can be found in the care plan section) Acute Rehab OT Goals Patient Stated Goal: to decrease pain and stop throwing up OT Goal Formulation: With patient Time For Goal Achievement: 01/02/16 Potential to Achieve Goals: Good ADL Goals Pt Will Perform Upper Body Bathing: with min assist;with caregiver independent in assisting;sitting (while adhering to shoulder precautions) Pt Will Perform Upper Body Dressing: with min assist;with caregiver independent in assisting;sitting (while adhering to shoulder precautions) Pt Will Transfer to Toilet: with modified independence;ambulating;regular height toilet Pt Will Perform Toileting - Clothing Manipulation and hygiene: with modified independence;sitting/lateral leans;sit to/from stand Pt/caregiver will Perform Home Exercise Program: Increased ROM;Right Upper extremity;Independently;With written HEP provided  OT Frequency: Min 2X/week   Barriers to D/C: Decreased caregiver support  Only intermittent assistance from family       Co-evaluation              End of Session Equipment Utilized During Treatment: Gait belt;Other (comment) (sling) Nurse Communication: Mobility status;Weight bearing status;Precautions  Activity Tolerance: Patient limited by pain Patient left: in chair;with call bell/phone within reach;with SCD's reapplied   Time: 1005-1035 OT Time Calculation (min): 30 min Charges:  OT General Charges $OT Visit: 1 Procedure OT Evaluation $OT Eval Moderate Complexity: 1 Procedure OT Treatments $Self Care/Home Management : 8-22 mins G-Codes:    Redmond Baseman, OTR/L Pager: [phone] 12/19/2015, 11:50 AM

## 2015-12-19 NOTE — Progress Notes (Signed)
Patient ID: Patricia [name], female   DOB: 12/20/1935, 80 y.o.   MRN: [id number]     Subjective:  Patient reports pain as mild to moderate.  Patient having pain an nausea sitting up in the chair.  Pain regimen not working   Objective:   VITALS:   Filed Vitals:   12/18/15 1522 12/18/15 2102 12/19/15 0014 12/19/15 0416  BP: 132/50 121/46 129/48 131/39  Pulse: 69 72 71 65  Temp: 97.5 F (36.4 C) 97.8 F (36.6 C) 98.2 F (36.8 C) 97.5 F (36.4 C)  TempSrc: Oral Oral Oral Oral  Resp: _0 Height:      Weight:      SpO2: 96% 96% 95% 97%    ABD soft Sensation intact distally Dorsiflexion/Plantar flexion intact Incision: dressing C/D/I and no drainage Good wrist and hand motion  Lab Results  Component Value Date   WBC 5.7 12/07/2015   HGB 10.9* 12/07/2015   HCT 34.6* 12/07/2015   MCV 84.4 12/07/2015   PLT 241 12/07/2015   BMET    Component Value Date/Time   NA 139 12/07/2015 1246   NA 140 09/24/2015 0916   K 4.8 12/07/2015 1246   CL 101 12/07/2015 1246   CO2 29 12/07/2015 1246   GLUCOSE 105* 12/07/2015 1246   GLUCOSE 131* 09/24/2015 0916   BUN 23* 12/07/2015 1246   BUN 14 09/24/2015 0916   CREATININE 1.09* 12/07/2015 1246   CREATININE 1.47* 11/20/2015 1421   CALCIUM 9.8 12/07/2015 1246   GFRNONAA 48* 12/07/2015 1246   GFRNONAA 71 07/18/2015 1303   GFRAA 55* 12/07/2015 1246   GFRAA 82 07/18/2015 1303     Assessment/Plan: 1 Day Post-Op   Principal Problem:   Primary localized osteoarthrosis of right shoulder Active Problems:   DM type 2 (diabetes mellitus, type 2) (HCC)   Anemia   Aortic stenosis   Atrial fibrillation with RVR (HCC)   Osteoarthritis of right shoulder   Advance diet Up with therapy Plan for discharge tomorrow Will adjust pain meds to see if we can find a regimen that works Sling at all times nwb right upper ext   Remonia Richter 12/19/2015, 8:12 AM  Seen and agree with above. Will try tramadol with the  hydrocodone.  [name], MD Cell [phone]

## 2015-12-19 NOTE — Progress Notes (Signed)
Occupational Therapy Treatment Patient Details Name: Patricia [name] MRN: [id number] DOB: 1936/02/04 Today's Date: 12/19/2015    History of present illness 80 y.o. female s/p R total shoulder arthroplasty. PMH significant for HTN, arthritis, DM type II, anemia, severe aortic stenosis, atrial fibrillation, dysrhythmia, heart murmur, dyspnea, urge incontinence, LE joint pain, and L shoulder arthoscopy.   OT comments  Pt was more alert and willing to participate with therapy. Pt completed UB dressing and and UE exercises with min guard-mod assist and pt's daughter present and instructed how to assist pt with these tasks. Reviewed shoulder discharge protocol handout. Will continue to follow acutely to address OT needs and goals.   Follow Up Recommendations  Home health OT;Supervision/Assistance - 24 hour    Equipment Recommendations  None recommended by OT    Recommendations for Other Services      Precautions / Restrictions Precautions Precautions: Shoulder Type of Shoulder Precautions: Conservative protocol Shoulder Interventions: Shoulder sling/immobilizer;Off for dressing/bathing/exercises Precaution Booklet Issued: Yes (comment) Precaution Comments: NO shoulder P/AROM; AROM at elbow, wrist and hand allowed Required Braces or Orthoses: Sling Restrictions Weight Bearing Restrictions: Yes RUE Weight Bearing: Non weight bearing       Mobility Bed Mobility               General bed mobility comments: Pt up in chair on OT arrival  Transfers                 General transfer comment: No transfers attempted this session. Focus of session on compensatory strategies for UB ADLs and UE exercises.    Balance Overall balance assessment: Needs assistance Sitting-balance support: No upper extremity supported;Feet supported Sitting balance-Leahy Scale: Fair                             ADL Overall ADL's : Needs assistance/impaired         Upper Body  Bathing: Minimal assitance;Cueing for compensatory techniques;Sitting       Upper Body Dressing : Moderate assistance;Sitting;Cueing for compensatory techniques Upper Body Dressing Details (indicate cue type and reason): To don/doff sling                   General ADL Comments: Practiced donning/doffing sling, compensatory strategies for dressing and bathing, and completed UE exercises at hand, wrist, and elbow.      Vision                     Perception     Praxis      Cognition   Behavior During Therapy: Flat affect Overall Cognitive Status: Within Functional Limits for tasks assessed                       Extremity/Trunk Assessment               Exercises     Shoulder Instructions       General Comments      Pertinent Vitals/ Pain       Pain Assessment: 0-10 Pain Score: 6  Pain Location: R shoulder Pain Descriptors / Indicators: Sore Pain Intervention(s): Limited activity within patient's tolerance;Monitored during session;Repositioned;Ice applied  Home Living                                          Prior  Functioning/Environment              Frequency Min 2X/week     Progress Toward Goals  OT Goals(current goals can now be found in the care plan section)  Progress towards OT goals: Progressing toward goals  Acute Rehab OT Goals Patient Stated Goal: to increase right shoulder function/ROM OT Goal Formulation: With patient Time For Goal Achievement: 01/02/16 Potential to Achieve Goals: Good ADL Goals Pt Will Perform Upper Body Bathing: with min assist;with caregiver independent in assisting;sitting Pt Will Perform Upper Body Dressing: with min assist;with caregiver independent in assisting;sitting Pt Will Transfer to Toilet: with modified independence;ambulating;regular height toilet Pt Will Perform Toileting - Clothing Manipulation and hygiene: with modified independence;sitting/lateral leans;sit  to/from stand Pt/caregiver will Perform Home Exercise Program: Increased ROM;Right Upper extremity;Independently;With written HEP provided  Plan Discharge plan remains appropriate    Co-evaluation                 End of Session Equipment Utilized During Treatment: Gait belt;Other (comment) (sling)   Activity Tolerance Patient tolerated treatment well   Patient Left in chair;with call bell/phone within reach;with family/visitor present   Nurse Communication Mobility status;Weight bearing status;Precautions        Time: 8719-5974 OT Time Calculation (min): 23 min  Charges: OT General Charges $OT Visit: 1 Procedure OT Treatments $Self Care/Home Management : 23-37 mins  Redmond Baseman, OTR/L Pager: [phone] 12/19/2015, 4:04 PM

## 2015-12-19 NOTE — Anesthesia Postprocedure Evaluation (Signed)
Anesthesia Post Note  Patient: Patricia [name]  Procedure(s) Performed: Procedure(s) (LRB): TOTAL SHOULDER ARTHROPLASTY (Right)  Patient location during evaluation: PACU Anesthesia Type: General Level of consciousness: sedated Pain management: satisfactory to patient Vital Signs Assessment: post-procedure vital signs reviewed and stable Respiratory status: spontaneous breathing Cardiovascular status: stable Anesthetic complications: no    Last Vitals:  Filed Vitals:   12/19/15 1022 12/19/15 1608  BP: 144/56 121/40  Pulse:  72  Temp:  36.7 C  Resp:  16    Last Pain:  Filed Vitals:   12/19/15 1608  PainSc: 7                  Cinsere Mizrahi EDWARD

## 2015-12-20 LAB — GLUCOSE, CAPILLARY
Glucose-Capillary: 133 mg/dL — ABNORMAL HIGH (ref 65–99)
Glucose-Capillary: 148 mg/dL — ABNORMAL HIGH (ref 65–99)
Glucose-Capillary: 145 mg/dL — ABNORMAL HIGH (ref 65–99)

## 2015-12-20 NOTE — Care Management Note (Signed)
Case Management Note  Patient Details  Name: Patricia [name] MRN: [id number] Date of Birth: July 18, 1936  Subjective/Objective:  80 yr old female s/p right total shoulder arthroplasty.                Action/Plan: Case manager spoke with patient concerning discharge plan. Choice was offered for home health. Referral was called to Estrella Myrtle, [place] [facility].  Patient has no DME needs. Will have family support at discharge.  Expected Discharge Date:   12/20/15               Expected Discharge Plan:  [place]  In-House Referral:     Discharge planning Services  CM Consult  Post Acute Care Choice:  Home Health Choice offered to:  Patient  DME Arranged:  N/A DME Agency:  [facility] Arranged:  OT, PT [facility] Agency:  [facility]  Status of Service:  Completed, signed off  Medicare Important Message Given:    Date Medicare IM Given:    Medicare IM give by:    Date Additional Medicare IM Given:    Additional Medicare Important Message give by:     If discussed at [place] of Stay Meetings, dates discussed:    Additional Comments:  Ninfa Meeker, RN 12/20/2015, 2:14 PM

## 2015-12-20 NOTE — Progress Notes (Signed)
Occupational Therapy Treatment Patient Details Name: LAJOYA DOMBEK MRN: [id number] DOB: 01-Sep-1936 Today's Date: 12/20/2015    History of present illness 80 y.o. female s/p R total shoulder arthroplasty. PMH significant for HTN, arthritis, DM type II, anemia, severe aortic stenosis, atrial fibrillation, dysrhythmia, heart murmur, dyspnea, urge incontinence, LE joint pain, and L shoulder arthoscopy.   OT comments  Educated pt on compensatory techniques for ADL and functional mobility. Pt is unable to complete ADL tasks at this time and requires 24/7 assistance initially. Pt is very high fall risk. Educated pt on need to have someone walk with her at all times due to poor balance. Pt verbalized understanding. Pt given written information on shoulder protocol and ADL management. If family needs further education, please page me @ [phone]. Pt will need to follow up with HHOT.   Follow Up Recommendations  Home health OT;Supervision/Assistance - 24 hour;Other (comment) (Assist with all mobility)    Equipment Recommendations  Other (comment);3 in 1 bedside comode (straight cane)    Recommendations for Other Services      Precautions / Restrictions Precautions Precautions: Shoulder Type of Shoulder Precautions: Conservative protocol Shoulder Interventions: Shoulder sling/immobilizer;Off for dressing/bathing/exercises Precaution Booklet Issued: Yes (comment) Precaution Comments: NO shoulder P/AROM; AROM at elbow, wrist and hand allowed Required Braces or Orthoses: Sling Restrictions RUE Weight Bearing: Non weight bearing       Mobility Bed Mobility               General bed mobility comments: Pt OOB in recliner chair already this AM  Transfers Overall transfer level: Needs assistance Equipment used: Straight cane Transfers: Stand Pivot Transfers;Sit to/from Stand Sit to Stand: Min guard Stand pivot transfers: Min assist       General transfer comment: Easily loses  balance    Balance     Sitting balance-Leahy Scale: Fair       Standing balance-Leahy Scale: Poor                     ADL                           Toilet Transfer: Minimal Teacher, English as a foreign language;Ambulation   Toileting- Clothing Manipulation and Hygiene: Minimal assistance       Functional mobility during ADLs: Minimal assistance General ADL Comments: Pt did not recall information from yesterday's OT session. Completed educaiton regarding compensatory technqiues for ADL and mobility within shoulder portocol parameters. Pt unable to donn "shirt" or sling independently and will need assistance from family. Requried mod A at times to regain balance during mobility with [place]. discussed fall concern with pt and need to have someone with her at all times intitially after D/C. Discussed fall risk and the importance of reducing risk of falls. Pt stated she "would not fall". Again, educated pt on concerns and recommended pt have someone with her at all times when ambulating. also recommended pt use 3 in 1 at night to reduce risk of falls. Pt verbalized understanding.       Vision                     Perception     Praxis      Cognition   Behavior During Therapy: [facility] for tasks assessed/performed Overall Cognitive Status: Within Functional Limits for tasks assessed  Extremity/Trunk Assessment               Exercises     Shoulder Instructions       General Comments      Pertinent Vitals/ Pain       Pain Assessment: 0-10 Pain Score: 5  Pain Location: right shoulder Pain Descriptors / Indicators: Aching;Grimacing Pain Intervention(s): Limited activity within patient's tolerance;Repositioned  Home Living                                          Prior Functioning/Environment              Frequency       Progress Toward Goals  OT Goals(current goals can now be found in the care plan  section)  Progress towards OT goals: Progressing toward goals  Acute Rehab OT Goals Patient Stated Goal: to increase right shoulder function/ROM OT Goal Formulation: With patient Time For Goal Achievement: 01/02/16 Potential to Achieve Goals: Good ADL Goals Pt Will Perform Upper Body Bathing: with min assist;with caregiver independent in assisting;sitting Pt Will Perform Upper Body Dressing: with min assist;with caregiver independent in assisting;sitting Pt Will Transfer to Toilet: with modified independence;ambulating;regular height toilet Pt Will Perform Toileting - Clothing Manipulation and hygiene: with modified independence;sitting/lateral leans;sit to/from stand Pt/caregiver will Perform Home Exercise Program: Increased ROM;Right Upper extremity;Independently;With written HEP provided  Plan Discharge plan remains appropriate    Co-evaluation                 End of Session Equipment Utilized During Treatment: Gait belt   Activity Tolerance Patient tolerated treatment well   Patient Left in chair;with call bell/phone within reach   Nurse Communication Mobility status;Precautions;Weight bearing status        Time: 0156-1537 OT Time Calculation (min): 31 min  Charges: OT General Charges $OT Visit: 1 Procedure OT Treatments $Self Care/Home Management : 23-37 mins  Lou Loewe,HILLARY 12/20/2015, 1:36 PM   [facility], OTR/L  [phone] 12/20/2015

## 2015-12-20 NOTE — Progress Notes (Signed)
Physical Therapy Treatment Patient Details Name: Patricia [name] MRN: [id number] DOB: September 15, 1936 Today's Date: 12/20/2015    History of Present Illness 80 y.o. female s/p R total shoulder arthroplasty. PMH significant for HTN, arthritis, DM type II, anemia, severe aortic stenosis, atrial fibrillation, dysrhythmia, heart murmur, dyspnea, urge incontinence, LE joint pain, and L shoulder arthoscopy.    PT Comments    Pt is feeling better with less nausea, however, she is very unsteady on her feet even with use of a cane for gait.  She required up to mod assist for one LOB in the hallway during gait.  I think the more she gets up and moves around the better she will be, but the only way I feel safe sending her home is if we get verbal confirmation from a family member that they will stay with her for the first 2-3 days to assist her with all mobility at discharge.   Follow Up Recommendations  Home health PT;Supervision/Assistance - 24 hour (for her to be safe at home 24/7 assist first 2-3 days)     Equipment Recommendations  None recommended by PT    Recommendations for Other Services   NA     Precautions / Restrictions Precautions Precautions: Shoulder Type of Shoulder Precautions: Conservative protocol Shoulder Interventions: Shoulder sling/immobilizer;Off for dressing/bathing/exercises Precaution Booklet Issued: Yes (comment) Precaution Comments: NO shoulder P/AROM; AROM at elbow, wrist and hand allowed Required Braces or Orthoses: Sling Restrictions RUE Weight Bearing: Non weight bearing    Mobility  Bed Mobility               General bed mobility comments: Pt OOB in recliner chair already this AM  Transfers Overall transfer level: Needs assistance Equipment used: Straight cane Transfers: Sit to/from Stand Sit to Stand: Min guard         General transfer comment: Min guard assist for safety during transitions.    Ambulation/Gait Ambulation/Gait assistance: Mod  assist Ambulation Distance (Feet): 120 Feet Assistive device: Straight cane Gait Pattern/deviations: Step-through pattern;Staggering left;Staggering right Gait velocity: decreased Gait velocity interpretation: Below normal speed for age/gender General Gait Details: up to mod assist for one LOB during gait.  Pt reports poor balance at baseline.  Mostly min guard assist, but very unsteady on her feet even with cane use.    Stairs Stairs: Yes Stairs assistance: Min assist Stair Management: No rails;Forwards;With cane Number of Stairs: 1 (1 curb step x2 trials) General stair comments: Pt needs heavy min assist to keep her balance when going up and down curb step x 2.  Assist for balance, verbal cues for safe cane use.         Balance Overall balance assessment: Needs assistance Sitting-balance support: Feet supported;Single extremity supported Sitting balance-Leahy Scale: Fair     Standing balance support: Single extremity supported Standing balance-Leahy Scale: Poor                      Cognition Arousal/Alertness: Awake/alert Behavior During Therapy: Flat affect Overall Cognitive Status: Within Functional Limits for tasks assessed                             Pertinent Vitals/Pain Pain Assessment: 0-10 Pain Score: 5  Pain Location: right shoulder, bil upper thighs (pt reports h/o low back issues) Pain Descriptors / Indicators: Aching;Burning           PT Goals (current goals can now be found in  the care plan section) Acute Rehab PT Goals Patient Stated Goal: to increase right shoulder function/ROM Progress towards PT goals: Progressing toward goals    Frequency  Min 5X/week    PT Plan Discharge plan needs to be updated       End of Session Equipment Utilized During Treatment: Other (comment) (right arm sling) Activity Tolerance: Patient limited by pain;Other (comment) (and stiffness) Patient left: in chair;with call bell/phone within  reach     Time: 0852-0905 PT Time Calculation (min) (ACUTE ONLY): 13 min  Charges:  $Gait Training: 8-22 mins                     Patricia [name], [place], DPT [phone]   12/20/2015, 9:12 AM

## 2015-12-20 NOTE — Discharge Summary (Signed)
Physician Discharge Summary  Patient ID: Patricia [name] MRN: [id number] DOB/AGE: 1936/09/28 80 y.o.  Admit date: 12/18/2015 Discharge date: 12/20/2015  Admission Diagnoses:  Primary localized osteoarthrosis of right shoulder  Discharge Diagnoses:  Principal Problem:   Primary localized osteoarthrosis of right shoulder Active Problems:   DM type 2 (diabetes mellitus, type 2) (HCC)   Anemia   Aortic stenosis   Atrial fibrillation with RVR (HCC)   Osteoarthritis of right shoulder   Past Medical History  Diagnosis Date  . Allergy   . Arthritis   . Asthma   . Diabetes mellitus without complication ([place])   . Severe aortic stenosis   . Diarrhea   . Urge incontinence   . Lumbago   . Edema   . Cervicalgia   . Chronic airway obstruction, not elsewhere classified   . Pain in joint, lower leg   . Routine gynecological examination   . Scoliosis (and kyphoscoliosis), idiopathic   . Encounter for long-term (current) use of other medications   . Unspecified essential hypertension   . Other and unspecified hyperlipidemia   . Benign neoplasm of colon   . Carpal tunnel syndrome   . Unspecified hemorrhoids without mention of complication   . Diffuse cystic mastopathy   . Unspecified hypothyroidism   . Obesity, unspecified   . Cellulitis and abscess of finger, unspecified   . Other psoriasis   . Type II or unspecified type diabetes mellitus without mention of complication, uncontrolled   . Cerumen impaction   . Neck pain 05/23/2015  . Anemia, unspecified   . Atrial fibrillation status post cardioversion [facility]) 09/2015    s/p TEE/DCCV>>SR on amio  . Chronic anticoagulation 09/2015    Xarelto for afib, CHADS2VASC=5  . PONV (postoperative nausea and vomiting)   . Dysrhythmia   . Heart murmur   . Shortness of breath dyspnea     with exertion  . Primary localized osteoarthrosis of right shoulder 12/18/2015    Surgeries: Procedure(s): TOTAL SHOULDER ARTHROPLASTY on 12/18/2015    Consultants (if any):    Discharged Condition: Improved  Hospital Course: Patricia [name] is an 80 y.o. female who was admitted 12/18/2015 with a diagnosis of Primary localized osteoarthrosis of right shoulder and went to the operating room on 12/18/2015 and underwent the above named procedures.    She was given perioperative antibiotics:  Anti-infectives    Start     Dose/Rate Route Frequency Ordered Stop   12/18/15 1800  ceFAZolin (ANCEF) IVPB 1 g/50 mL premix     1 g 100 mL/hr over 30 Minutes Intravenous Every 6 hours 12/18/15 1511 12/19/15 0657   12/18/15 1030  ceFAZolin (ANCEF) IVPB 2 g/50 mL premix     2 g 100 mL/hr over 30 Minutes Intravenous To [facility] 12/17/15 1411 12/18/15 1130    .  She was given sequential compression devices, early ambulation for DVT prophylaxis.  She benefited maximally from the hospital stay and there were no complications.    Recent vital signs:  Filed Vitals:   12/20/15 1039 12/20/15 1055  BP: 122/90 122/90  Pulse:    Temp:    Resp:      Recent laboratory studies:  Lab Results  Component Value Date   HGB 9.6* 12/19/2015   HGB 10.9* 12/07/2015   HGB 9.6* 10/19/2015   Lab Results  Component Value Date   WBC 7.1 12/19/2015   PLT 206 12/19/2015   Lab Results  Component Value Date   INR 1.14 10/19/2015  Lab Results  Component Value Date   NA 137 12/19/2015   K 4.7 12/19/2015   CL 97* 12/19/2015   CO2 28 12/19/2015   BUN 15 12/19/2015   CREATININE 0.96 12/19/2015   GLUCOSE 189* 12/19/2015    Discharge Medications:     Medication List    TAKE these medications        ADVAIR DISKUS 100-50 MCG/DOSE Aepb  Generic drug:  Fluticasone-Salmeterol  Inhale 1 puff two times  daily for breathing     albuterol 108 (90 Base) MCG/ACT inhaler  Commonly known as:  PROVENTIL HFA;VENTOLIN HFA  Inhale 2 puffs into the lungs every 6 (six) hours as needed for wheezing or shortness of breath.     amiodarone 200 MG tablet   Commonly known as:  PACERONE  Take one tablet daily by mouth     baclofen 10 MG tablet  Commonly known as:  LIORESAL  Take 1 tablet (10 mg total) by mouth 3 (three) times daily. As needed for muscle spasm     cetirizine 10 MG tablet  Commonly known as:  ZYRTEC  Take 10 mg by mouth daily as needed for allergies.     cholecalciferol 1000 units tablet  Commonly known as:  VITAMIN D  One daily for vitamin D supplement     COMFORT LANCETS Misc  Use daily to get blood for glucometer     diltiazem 180 MG 24 hr capsule  Commonly known as:  CARDIZEM CD  Take 1 capsule by mouth  once a day to control blood pressure     doxazosin 4 MG tablet  Commonly known as:  CARDURA  Take one tablet by mouth once daily to help control blood pressure     furosemide 40 MG tablet  Commonly known as:  LASIX  Take 1 tablet (40 mg total) by mouth See admin instructions. Take 1 tablet on Monday/Wednesday and Friday. May take additional dose as needed for increased swelling or weight gain >3 lbs in 24 hr     glimepiride 2 MG tablet  Commonly known as:  AMARYL  Take 1 tablet by mouth at  breakfast to control  Glucose     glucose blood test strip  Commonly known as:  BAYER CONTOUR TEST  Use daily too check glucose     HYDROcodone-acetaminophen 10-325 MG tablet  Commonly known as:  NORCO  Take 1-2 tablets by mouth every 6 (six) hours as needed.     levothyroxine 50 MCG tablet  Commonly known as:  SYNTHROID, LEVOTHROID  Take 1 tablet by mouth  daily for thyroid  supplement     lisinopril 20 MG tablet  Commonly known as:  PRINIVIL,ZESTRIL  Take 1 tablet by mouth once daily for blood pressure     metFORMIN 500 MG tablet  Commonly known as:  GLUCOPHAGE  Take 500 mg by mouth 2 (two) times daily with a meal.     oxybutynin 5 MG tablet  Commonly known as:  DITROPAN  One twice daily to help control bladder     Potassium 95 MG Tabs  Take 1 tablet by mouth 3 (three) times a week. Pt only takes when  taking LASIX     pravastatin 20 MG tablet  Commonly known as:  PRAVACHOL  Take 1 tablet by mouth  daily     rivaroxaban 20 MG Tabs tablet  Commonly known as:  XARELTO  Take 1 tablet (20 mg total) by mouth daily with supper.  sennosides-docusate sodium 8.6-50 MG tablet  Commonly known as:  SENOKOT-S  Take 2 tablets by mouth daily.     triamcinolone 55 MCG/ACT Aero nasal inhaler  Commonly known as:  NASACORT AQ  Place 2 sprays into the nose daily.        Diagnostic Studies: Ct Shoulder Right Wo Contrast  11/29/2015  CLINICAL DATA:  Right shoulder pain for years. Worse over the last 6 months. Limited range of motion. EXAM: CT OF THE RIGHT SHOULDER WITHOUT CONTRAST TECHNIQUE: Multidetector CT imaging was performed according to the standard protocol. Multiplanar CT image reconstructions were also generated. COMPARISON:  None. FINDINGS: There is no acute fracture or dislocation. There is severe osteoarthritis of the right glenohumeral joint with severe joint space narrowing, marginal osteophytosis and subchondral cystic changes. There is mild remodeling of the glenoid. There is no lytic or sclerotic osseous lesion. There are mild degenerative changes of the acromioclavicular joint. There is a type II acromion. There is no muscle atrophy. There is no focal fluid collection or hematoma. IMPRESSION: Severe osteoarthritis of the right glenohumeral joint. Electronically Signed   By: Kathreen Devoid   On: 11/29/2015 13:22   Dg Shoulder Right Port  12/18/2015  CLINICAL DATA:  Postop from right shoulder replacement. EXAM: PORTABLE RIGHT SHOULDER - 1 VIEW COMPARISON:  None. FINDINGS: Right shoulder prosthesis seen in expected position. No evidence of fracture or dislocation. IMPRESSION: Expected postoperative appearance of right shoulder prosthesis. Electronically Signed   By: Earle Gell M.D.   On: 12/18/2015 14:09    Disposition: 01-Home or Self Care        Follow-up Information    Follow up  with Johnny Bridge, MD. Schedule an appointment as soon as possible for a visit in 2 weeks.   Specialty:  Orthopedic Surgery   Contact information:   [place] [address] [phone]        Signed: Johnny Bridge 12/20/2015, 1:59 PM

## 2015-12-20 NOTE — Progress Notes (Signed)
Blood sugar tonight  133. Machine was docked, but result did not show up on EPIC

## 2015-12-21 DIAGNOSIS — E669 Obesity, unspecified: Secondary | ICD-10-CM | POA: Diagnosis not present

## 2015-12-21 DIAGNOSIS — J45909 Unspecified asthma, uncomplicated: Secondary | ICD-10-CM | POA: Diagnosis not present

## 2015-12-21 DIAGNOSIS — Z7982 Long term (current) use of aspirin: Secondary | ICD-10-CM | POA: Diagnosis not present

## 2015-12-21 DIAGNOSIS — Z96612 Presence of left artificial shoulder joint: Secondary | ICD-10-CM | POA: Diagnosis not present

## 2015-12-21 DIAGNOSIS — E119 Type 2 diabetes mellitus without complications: Secondary | ICD-10-CM | POA: Diagnosis not present

## 2015-12-21 DIAGNOSIS — M150 Primary generalized (osteo)arthritis: Secondary | ICD-10-CM | POA: Diagnosis not present

## 2015-12-21 DIAGNOSIS — Z471 Aftercare following joint replacement surgery: Secondary | ICD-10-CM | POA: Diagnosis not present

## 2015-12-21 DIAGNOSIS — D649 Anemia, unspecified: Secondary | ICD-10-CM | POA: Diagnosis not present

## 2015-12-21 DIAGNOSIS — Z96611 Presence of right artificial shoulder joint: Secondary | ICD-10-CM | POA: Diagnosis not present

## 2015-12-21 DIAGNOSIS — I4891 Unspecified atrial fibrillation: Secondary | ICD-10-CM | POA: Diagnosis not present

## 2015-12-24 DIAGNOSIS — D649 Anemia, unspecified: Secondary | ICD-10-CM | POA: Diagnosis not present

## 2015-12-24 DIAGNOSIS — E119 Type 2 diabetes mellitus without complications: Secondary | ICD-10-CM | POA: Diagnosis not present

## 2015-12-24 DIAGNOSIS — Z471 Aftercare following joint replacement surgery: Secondary | ICD-10-CM | POA: Diagnosis not present

## 2015-12-24 DIAGNOSIS — J45909 Unspecified asthma, uncomplicated: Secondary | ICD-10-CM | POA: Diagnosis not present

## 2015-12-24 DIAGNOSIS — Z96611 Presence of right artificial shoulder joint: Secondary | ICD-10-CM | POA: Diagnosis not present

## 2015-12-24 DIAGNOSIS — M150 Primary generalized (osteo)arthritis: Secondary | ICD-10-CM | POA: Diagnosis not present

## 2015-12-26 DIAGNOSIS — J45909 Unspecified asthma, uncomplicated: Secondary | ICD-10-CM | POA: Diagnosis not present

## 2015-12-26 DIAGNOSIS — D649 Anemia, unspecified: Secondary | ICD-10-CM | POA: Diagnosis not present

## 2015-12-26 DIAGNOSIS — Z96611 Presence of right artificial shoulder joint: Secondary | ICD-10-CM | POA: Diagnosis not present

## 2015-12-26 DIAGNOSIS — E119 Type 2 diabetes mellitus without complications: Secondary | ICD-10-CM | POA: Diagnosis not present

## 2015-12-26 DIAGNOSIS — M150 Primary generalized (osteo)arthritis: Secondary | ICD-10-CM | POA: Diagnosis not present

## 2015-12-26 DIAGNOSIS — Z471 Aftercare following joint replacement surgery: Secondary | ICD-10-CM | POA: Diagnosis not present

## 2015-12-31 DIAGNOSIS — J45909 Unspecified asthma, uncomplicated: Secondary | ICD-10-CM | POA: Diagnosis not present

## 2015-12-31 DIAGNOSIS — E119 Type 2 diabetes mellitus without complications: Secondary | ICD-10-CM | POA: Diagnosis not present

## 2015-12-31 DIAGNOSIS — M150 Primary generalized (osteo)arthritis: Secondary | ICD-10-CM | POA: Diagnosis not present

## 2015-12-31 DIAGNOSIS — D649 Anemia, unspecified: Secondary | ICD-10-CM | POA: Diagnosis not present

## 2015-12-31 DIAGNOSIS — Z471 Aftercare following joint replacement surgery: Secondary | ICD-10-CM | POA: Diagnosis not present

## 2015-12-31 DIAGNOSIS — Z96611 Presence of right artificial shoulder joint: Secondary | ICD-10-CM | POA: Diagnosis not present

## 2016-01-01 DIAGNOSIS — M19011 Primary osteoarthritis, right shoulder: Secondary | ICD-10-CM | POA: Diagnosis not present

## 2016-01-02 DIAGNOSIS — M150 Primary generalized (osteo)arthritis: Secondary | ICD-10-CM | POA: Diagnosis not present

## 2016-01-02 DIAGNOSIS — Z96611 Presence of right artificial shoulder joint: Secondary | ICD-10-CM | POA: Diagnosis not present

## 2016-01-02 DIAGNOSIS — J45909 Unspecified asthma, uncomplicated: Secondary | ICD-10-CM | POA: Diagnosis not present

## 2016-01-02 DIAGNOSIS — D649 Anemia, unspecified: Secondary | ICD-10-CM | POA: Diagnosis not present

## 2016-01-02 DIAGNOSIS — E119 Type 2 diabetes mellitus without complications: Secondary | ICD-10-CM | POA: Diagnosis not present

## 2016-01-02 DIAGNOSIS — Z471 Aftercare following joint replacement surgery: Secondary | ICD-10-CM | POA: Diagnosis not present

## 2016-01-03 DIAGNOSIS — Z96611 Presence of right artificial shoulder joint: Secondary | ICD-10-CM | POA: Diagnosis not present

## 2016-01-03 DIAGNOSIS — E119 Type 2 diabetes mellitus without complications: Secondary | ICD-10-CM | POA: Diagnosis not present

## 2016-01-03 DIAGNOSIS — M150 Primary generalized (osteo)arthritis: Secondary | ICD-10-CM | POA: Diagnosis not present

## 2016-01-03 DIAGNOSIS — D649 Anemia, unspecified: Secondary | ICD-10-CM | POA: Diagnosis not present

## 2016-01-03 DIAGNOSIS — J45909 Unspecified asthma, uncomplicated: Secondary | ICD-10-CM | POA: Diagnosis not present

## 2016-01-03 DIAGNOSIS — Z471 Aftercare following joint replacement surgery: Secondary | ICD-10-CM | POA: Diagnosis not present

## 2016-01-04 ENCOUNTER — Other Ambulatory Visit: Payer: Self-pay | Admitting: Internal Medicine

## 2016-01-21 ENCOUNTER — Other Ambulatory Visit: Payer: Medicare Other

## 2016-01-23 ENCOUNTER — Encounter: Payer: Self-pay | Admitting: Internal Medicine

## 2016-01-23 ENCOUNTER — Ambulatory Visit (INDEPENDENT_AMBULATORY_CARE_PROVIDER_SITE_OTHER): Payer: Medicare Other | Admitting: Internal Medicine

## 2016-01-23 VITALS — BP 128/62 | HR 64 | Temp 97.7°F | Ht 63.0 in | Wt 211.0 lb

## 2016-01-23 DIAGNOSIS — I10 Essential (primary) hypertension: Secondary | ICD-10-CM

## 2016-01-23 DIAGNOSIS — E669 Obesity, unspecified: Secondary | ICD-10-CM

## 2016-01-23 DIAGNOSIS — R0982 Postnasal drip: Secondary | ICD-10-CM

## 2016-01-23 DIAGNOSIS — R609 Edema, unspecified: Secondary | ICD-10-CM | POA: Diagnosis not present

## 2016-01-23 DIAGNOSIS — E785 Hyperlipidemia, unspecified: Secondary | ICD-10-CM

## 2016-01-23 DIAGNOSIS — J329 Chronic sinusitis, unspecified: Secondary | ICD-10-CM

## 2016-01-23 DIAGNOSIS — E119 Type 2 diabetes mellitus without complications: Secondary | ICD-10-CM | POA: Diagnosis not present

## 2016-01-23 DIAGNOSIS — E039 Hypothyroidism, unspecified: Secondary | ICD-10-CM

## 2016-01-23 DIAGNOSIS — G8929 Other chronic pain: Secondary | ICD-10-CM

## 2016-01-23 DIAGNOSIS — D649 Anemia, unspecified: Secondary | ICD-10-CM | POA: Diagnosis not present

## 2016-01-23 DIAGNOSIS — M545 Other chronic pain: Secondary | ICD-10-CM

## 2016-01-23 MED ORDER — LEVOTHYROXINE SODIUM 50 MCG PO TABS: ORAL_TABLET | ORAL | Status: DC

## 2016-01-23 MED ORDER — IPRATROPIUM BROMIDE 0.06 % NA SOLN: 2.0000 | Freq: Four times a day (QID) | NASAL | Status: DC

## 2016-01-23 MED ORDER — FUROSEMIDE 40 MG PO TABS: ORAL_TABLET | ORAL | Status: DC

## 2016-01-23 MED ORDER — GLIMEPIRIDE 2 MG PO TABS: ORAL_TABLET | ORAL | Status: DC

## 2016-01-23 MED ORDER — METFORMIN HCL 500 MG PO TABS: ORAL_TABLET | ORAL | Status: DC

## 2016-01-23 MED ORDER — LISINOPRIL 20 MG PO TABS: ORAL_TABLET | ORAL | Status: DC

## 2016-01-23 NOTE — Progress Notes (Signed)
Patient ID: Patricia [name], female   DOB: Mar 16, 1936, 80 y.o.   MRN: [id number]   Location:  [facility]  Provider: Jeanmarie Hubert, M.D.  Code Status: full Goals of Care:  Advanced Directives 12/07/2015  Does patient have an advance directive? No  Would patient like information on creating an advanced directive? No - patient declined information     Chief Complaint  Patient presents with  . Medical Management of Chronic Issues    Medical Management of Chronic issues. 4 month follow up    HPI: Patient is a 80 y.o. female seen today for medical management of chronic diseases.    Patient last seen by me 11/27/2015. Problems covered included atrial flutter, diabetes mellitus, hypertension, hyperlipidemia, hypothyroidism, edema, aortic stenosis, pain in her right shoulder, and postnasal drainage.  Following her last visit with me, she had preadmission testing and then right shoulder surgery by Dr. [name] on 12/18/2015. Total shoulder arthroplasty of the right shoulder due to osteoarthritis. She remains in a sling. Had some home therapy, but she does not think that did anything to help. She will see Dr. Mardelle Matte on 02/05/16. Pain control is better.   Has lost 8#.   Home glucose is under good control.  Nasal drainage persists. Nasacort did not help.Nasal swab was positive for Staph aureus pre surgery.     Past Medical History  Diagnosis Date  . Allergy   . Arthritis   . Asthma   . Diabetes mellitus without complication ([place])   . Severe aortic stenosis   . Diarrhea   . Urge incontinence   . Lumbago   . Edema   . Cervicalgia   . Chronic airway obstruction, not elsewhere classified   . Pain in joint, lower leg   . Routine gynecological examination   . Scoliosis (and kyphoscoliosis), idiopathic   . Encounter for long-term (current) use of other medications   . Unspecified essential hypertension   . Other and unspecified hyperlipidemia   . Benign neoplasm of colon   .  Carpal tunnel syndrome   . Unspecified hemorrhoids without mention of complication   . Diffuse cystic mastopathy   . Unspecified hypothyroidism   . Obesity, unspecified   . Cellulitis and abscess of finger, unspecified   . Other psoriasis   . Type II or unspecified type diabetes mellitus without mention of complication, uncontrolled   . Cerumen impaction   . Neck pain 05/23/2015  . Anemia, unspecified   . Atrial fibrillation status post cardioversion [facility]) 09/2015    s/p TEE/DCCV>>SR on amio  . Chronic anticoagulation 09/2015    Xarelto for afib, CHADS2VASC=5  . PONV (postoperative nausea and vomiting)   . Dysrhythmia   . Heart murmur   . Shortness of breath dyspnea     with exertion  . Primary localized osteoarthrosis of right shoulder 12/18/2015    Past Surgical History  Procedure Laterality Date  . Breast surgery      right breast 3 surgeries [phone]  . Cholecystectomy  1992    DR BOWMAN  . Colon surgery      2004,2007,2013.  3 times colon surgieres  . Abdominal hysterectomy  1987    complete  . Vaginal cyst removed  1967  . 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 Foot surgery  1989  . Excise le mandibular lymph node t      DR C. NEWMAN  . Left shoulder arthroscopy    . Neuroplasty / transposition median nerve at carpal tunnel  bilateral  05/2003  . Tee without cardioversion N/A 10/19/2015    Procedure: Transesophageal Echocardiogram (TEE) ;  Surgeon: Lelon Perla, MD;  Location: [facility] ENDOSCOPY;  Service: Cardiovascular;  Laterality: N/A;  . Cardioversion N/A 10/19/2015    Procedure: CARDIOVERSION;  Surgeon: Lelon Perla, MD;  Location: [facility] ENDOSCOPY;  Service: Cardiovascular;  Laterality: N/A;  . Trigger finger release Left   . Total shoulder arthroplasty Right 12/18/2015    Procedure: TOTAL SHOULDER ARTHROPLASTY;  Surgeon: [name], MD;  Location: [place];  Service: Orthopedics;  Laterality: Right;    Allergies  Allergen Reactions  . Codeine Other (See  Comments)    "spaces her out, dizziness, can't walk well"  . Vesicare [Solifenacin] Other (See Comments)    unknown      Medication List       This list is accurate as of: 01/23/16 12:38 PM.  Always use your most recent med list.               ADVAIR DISKUS 100-50 MCG/DOSE Aepb  Generic drug:  Fluticasone-Salmeterol  Inhale 1 puff two times  daily for breathing     albuterol 108 (90 Base) MCG/ACT inhaler  Commonly known as:  PROVENTIL HFA;VENTOLIN HFA  Inhale 2 puffs into the lungs every 6 (six) hours as needed for wheezing or shortness of breath.     amiodarone 200 MG tablet  Commonly known as:  PACERONE  Take one tablet daily by mouth     cetirizine 10 MG tablet  Commonly known as:  ZYRTEC  Take 10 mg by mouth daily as needed for allergies.     cholecalciferol 1000 units tablet  Commonly known as:  VITAMIN D  One daily for vitamin D supplement     COMFORT LANCETS Misc  Use daily to get blood for glucometer     diltiazem 180 MG 24 hr capsule  Commonly known as:  CARDIZEM CD  Take 1 capsule by mouth  once a day to control blood pressure     doxazosin 4 MG tablet  Commonly known as:  CARDURA  Take one tablet by mouth once daily to help control blood pressure     furosemide 40 MG tablet  Commonly known as:  LASIX  Take 1 tablet (40 mg total) by mouth See admin instructions. Take 1 tablet on Monday/Wednesday and Friday. May take additional dose as needed for increased swelling or weight gain >3 lbs in 24 hr     glimepiride 2 MG tablet  Commonly known as:  AMARYL  Take 1 tablet by mouth at  breakfast to control  Glucose     glucose blood test strip  Commonly known as:  BAYER CONTOUR TEST  Use daily too check glucose     HYDROcodone-acetaminophen 10-325 MG tablet  Commonly known as:  NORCO  Take 1-2 tablets by mouth every 6 (six) hours as needed.     levothyroxine 50 MCG tablet  Commonly known as:  SYNTHROID, LEVOTHROID  Take 1 tablet by mouth  daily for  thyroid  supplement     lisinopril 20 MG tablet  Commonly known as:  PRINIVIL,ZESTRIL  Take 1 tablet by mouth once daily for blood pressure     metFORMIN 500 MG tablet  Commonly known as:  GLUCOPHAGE  TAKE ONE TABLET BEFORE  BREAKFAST AND ONE TABLET  BEFORE SUPPER TO CONTROL  BLOOD SUGARS     oxybutynin 5 MG tablet  Commonly known as:  DITROPAN  One twice  daily to help control bladder     Potassium 95 MG Tabs  Take 1 tablet by mouth 3 (three) times a week. Pt only takes when taking LASIX     pravastatin 20 MG tablet  Commonly known as:  PRAVACHOL  Take 1 tablet by mouth  daily     rivaroxaban 20 MG Tabs tablet  Commonly known as:  XARELTO  Take 1 tablet (20 mg total) by mouth daily with supper.     triamcinolone 55 MCG/ACT Aero nasal inhaler  Commonly known as:  NASACORT AQ  Place 2 sprays into the nose daily.        Review of Systems:  Review of Systems  Constitutional: Negative for fever, chills, diaphoresis, activity change, appetite change, fatigue and unexpected weight change.       Gaining weight  HENT: Positive for postnasal drip. Negative for congestion, ear discharge, ear pain, hearing loss, rhinorrhea, sore throat, tinnitus, trouble swallowing and voice change.   Eyes: Negative for pain, redness, itching and visual disturbance.  Respiratory: Negative for cough, choking, shortness of breath and wheezing.   Cardiovascular: Negative for chest pain, palpitations and leg swelling.       Aortic valve ejection murmur  Gastrointestinal: Negative for nausea, abdominal pain, diarrhea, constipation and abdominal distention.  Endocrine: Negative for cold intolerance, heat intolerance, polydipsia, polyphagia and polyuria.       Diabetic. Hypothyroid.  Genitourinary: Negative for dysuria, urgency, frequency, hematuria, flank pain, vaginal discharge, difficulty urinating and pelvic pain.  Musculoskeletal: Positive for back pain and arthralgias. Negative for myalgias, gait  problem, neck pain and neck stiffness.       Post right shoulder arthroplasty in Feb. 2017. Has seen Dr. Eddie Dibbles about pain in the right knee. Got injection but it did not help. He talked about using a gel injection. Knee gives out sometimes. Using a cane at times. Recurrent problems with mild discomfort.  Skin: Negative for color change, pallor and rash.  Allergic/Immunologic: Negative.   Neurological: Negative for dizziness, tremors, seizures, syncope, weakness, numbness and headaches.  Hematological: Negative for adenopathy. Does not bruise/bleed easily.       Anemic on lab in October 2016  Psychiatric/Behavioral: Negative.  Negative for suicidal ideas, hallucinations, behavioral problems, confusion, sleep disturbance, dysphoric mood and agitation. The patient is not nervous/anxious and is not hyperactive.     Health Maintenance  Topic Date Due  . TETANUS/TDAP  08/12/1957  . ZOSTAVAX  08/12/1998  . OPHTHALMOLOGY EXAM  12/15/2015  . FOOT EXAM  01/24/2016  . INFLUENZA VACCINE  05/27/2016  . HEMOGLOBIN A1C  06/05/2016  . PNA vac Low Risk Adult (2 of 2 - PPSV23) 09/25/2016  . COLONOSCOPY  10/27/2017  . DEXA SCAN  Completed    Physical Exam: Filed Vitals:   01/23/16 1210  BP: 128/62  Pulse: 64  Temp: 97.7 F (36.5 C)  TempSrc: Oral  Height: _0  (1.6 m)  Weight: 211 lb (95.709 kg)   Body mass index is 37.39 kg/(m^2). Physical Exam  Constitutional: She is oriented to person, place, and time. No distress.  Obese  HENT:  Head: Normocephalic and atraumatic.  Right Ear: External ear normal.  Left Ear: External ear normal.  Nose: Nose normal.  Mouth/Throat: Oropharynx is clear and moist.  Eyes: Conjunctivae and EOM are normal.  Neck: Normal range of motion. Neck supple. No JVD present. No tracheal deviation present. No thyromegaly present.  Cardiovascular: Normal rate, regular rhythm and intact distal pulses.  Exam reveals no  gallop and no friction rub.   Murmur (2/6 SEM at the  aortic area and LSB) heard. 3/6 aortic ejection murmur  Pulmonary/Chest: No respiratory distress. She has no wheezes. She exhibits no tenderness.  Abdominal: Bowel sounds are normal. She exhibits no distension and no mass. There is no tenderness.  Musculoskeletal: Normal range of motion. She exhibits edema. She exhibits no tenderness.  Pain in the both shoulders (R>L) with rotational movement. Pain in both knees (R>L).  Lymphadenopathy:    She has no cervical adenopathy.  Neurological: She is alert and oriented to person, place, and time. She has normal reflexes. No cranial nerve deficit. Coordination normal.  Skin: No rash noted. No erythema. No pallor.  Psychiatric: She has a normal mood and affect. Judgment and thought content normal.    Labs reviewed: Basic Metabolic Panel:  Recent Labs  05/21/15 0943  09/24/15 0916 10/18/15 1800 10/18/15 2142 10/19/15 0334  11/20/15 1421 12/07/15 1246 12/19/15 0844  NA 138  < > 140 137  --  140  < > 136 139 137  K 5.6*  < > 4.5 4.4  --  3.9  < > 4.9 4.8 4.7  CL 97  < > 99 103  --  102  < > 97* 101 97*  CO2 23  < > 24 24  --  28  < > _0 GLUCOSE 146*  < > 131* 115*  --  149*  < > 103* 105* 189*  BUN 23  < > 14 19  --  20  < > 45* 23* 15  CREATININE 0.98  < > 0.85 0.96  --  1.05*  < > 1.47* 1.09* 0.96  CALCIUM 9.6  < > 9.3 9.1  --  9.0  < > 9.8 9.8 9.0  MG  --   --   --  2.0  --  1.8  --   --   --   --   TSH 2.110  --  3.150  --  3.029  --   --   --   --   --   < > = values in this interval not displayed. Liver Function Tests:  Recent Labs  07/18/15 1303 09/24/15 0916 10/19/15 0334  AST _1 ALT _2 ALKPHOS 73 70 57  BILITOT 0.4 0.3 0.7  PROT 7.7 7.1 6.8  ALBUMIN 4.5 4.1 3.4*   No results for input(s): LIPASE, AMYLASE in the last 8760 hours. No results for input(s): AMMONIA in the last 8760 hours. CBC:  Recent Labs  10/19/15 0334 12/07/15 1246 12/19/15 0844  WBC 7.1 5.7 7.1  HGB 9.6* 10.9* 9.6*  HCT  29.6* 34.6* 30.6*  MCV 85.3 84.4 87.4  PLT 253 241 206   Lipid Panel:  Recent Labs  05/21/15 0943 09/24/15 0916  CHOL 163 165  HDL 51 53  LDLCALC 71 88  TRIG 204* 119  CHOLHDL 3.2 3.1   Lab Results  Component Value Date   HGBA1C 7.2* 12/07/2015   Assessment/Plan 1. Anemia, unspecified anemia type followup in future  2. Type 2 diabetes mellitus without complication, without long-term current use of insulin (HCC) - glimepiride (AMARYL) 2 MG tablet; Take 1 tablet by mouth at  breakfast to control  Glucose  Dispense: 90 tablet; Refill: 3 - metFORMIN (GLUCOPHAGE) 500 MG tablet; TAKE ONE TABLET BEFORE  BREAKFAST AND ONE TABLET  BEFORE SUPPER TO CONTROL  BLOOD SUGARS  Dispense: 180 tablet; Refill: 3 -  Hemoglobin A1c; Future - Comprehensive metabolic panel; Future - Microalbumin, urine; Future  3. Essential hypertension - lisinopril (PRINIVIL,ZESTRIL) 20 MG tablet; Take 1 tablet by mouth once daily for blood pressure  Dispense: 90 tablet; Refill: 3 - Comprehensive metabolic panel; Future  4. Hyperlipidemia - Lipid panel; Future  5. Hypothyroidism, unspecified hypothyroidism type - levothyroxine (SYNTHROID, LEVOTHROID) 50 MCG tablet; Take 1 tablet by mouth  daily for thyroid  supplement  Dispense: 90 tablet; Refill: 3 - TSH; Future  6. Obesity Losing weight  7. Chronic midline low back pain without sciatica Continue pain medications  8. Post-nasal drainage - ipratropium (ATROVENT) 0.06 % nasal spray; Place 2 sprays into both nostrils 4 (four) times daily.  Dispense: 15 mL; Refill: 12  9. Edema, unspecified type - furosemide (LASIX) 40 MG tablet; Take 1 tablet on Mon, Wed, and Frid. May take additional dose as needed for increased swelling or weight gain >3 lbs in 24 hr  Dispense: 90 tablet; Refill: 3

## 2016-02-01 ENCOUNTER — Encounter: Payer: Self-pay | Admitting: *Deleted

## 2016-02-05 ENCOUNTER — Ambulatory Visit (INDEPENDENT_AMBULATORY_CARE_PROVIDER_SITE_OTHER): Payer: Medicare Other | Admitting: Cardiology

## 2016-02-05 ENCOUNTER — Encounter: Payer: Self-pay | Admitting: Cardiology

## 2016-02-05 VITALS — BP 132/52 | HR 77 | Ht 63.0 in | Wt 213.8 lb

## 2016-02-05 DIAGNOSIS — M19011 Primary osteoarthritis, right shoulder: Secondary | ICD-10-CM | POA: Diagnosis not present

## 2016-02-05 DIAGNOSIS — I5032 Chronic diastolic (congestive) heart failure: Secondary | ICD-10-CM

## 2016-02-05 DIAGNOSIS — I10 Essential (primary) hypertension: Secondary | ICD-10-CM | POA: Diagnosis not present

## 2016-02-05 DIAGNOSIS — I350 Nonrheumatic aortic (valve) stenosis: Secondary | ICD-10-CM | POA: Diagnosis not present

## 2016-02-05 DIAGNOSIS — D62 Acute posthemorrhagic anemia: Secondary | ICD-10-CM

## 2016-02-05 DIAGNOSIS — I480 Paroxysmal atrial fibrillation: Secondary | ICD-10-CM

## 2016-02-05 DIAGNOSIS — E119 Type 2 diabetes mellitus without complications: Secondary | ICD-10-CM | POA: Diagnosis not present

## 2016-02-05 DIAGNOSIS — I359 Nonrheumatic aortic valve disorder, unspecified: Secondary | ICD-10-CM | POA: Diagnosis not present

## 2016-02-05 DIAGNOSIS — I4891 Unspecified atrial fibrillation: Secondary | ICD-10-CM | POA: Diagnosis not present

## 2016-02-05 LAB — BASIC METABOLIC PANEL
BUN: 39 mg/dL — ABNORMAL HIGH (ref 7–25)
CO2: 28 mmol/L (ref 20–31)
Calcium: 9.2 mg/dL (ref 8.6–10.4)
Chloride: 97 mmol/L — ABNORMAL LOW (ref 98–110)
Creat: 1.39 mg/dL — ABNORMAL HIGH (ref 0.60–0.93)
Glucose, Bld: 115 mg/dL — ABNORMAL HIGH (ref 65–99)
Potassium: 4.5 mmol/L (ref 3.5–5.3)
Sodium: 137 mmol/L (ref 135–146)

## 2016-02-05 LAB — CBC WITH DIFFERENTIAL/PLATELET
Basophils Absolute: 0 cells/uL (ref 0–200)
Basophils Relative: 0 %
Eosinophils Absolute: 160 cells/uL (ref 15–500)
Eosinophils Relative: 2 %
HCT: 22.8 % — ABNORMAL LOW (ref 35.0–45.0)
Hemoglobin: 7.1 g/dL — ABNORMAL LOW (ref 11.7–15.5)
Lymphocytes Relative: 25 %
Lymphs Abs: 2000 cells/uL (ref 850–3900)
MCH: 27.2 pg (ref 27.0–33.0)
MCHC: 31.1 g/dL — ABNORMAL LOW (ref 32.0–36.0)
MCV: 87.4 fL (ref 80.0–100.0)
MPV: 9.8 fL (ref 7.5–12.5)
Monocytes Absolute: 560 cells/uL (ref 200–950)
Monocytes Relative: 7 %
Neutro Abs: 5280 cells/uL (ref 1500–7800)
Neutrophils Relative %: 66 %
Platelets: 323 10*3/uL (ref 140–400)
RBC: 2.61 MIL/uL — ABNORMAL LOW (ref 3.80–5.10)
RDW: 18 % — ABNORMAL HIGH (ref 11.0–15.0)
WBC: 8 10*3/uL (ref 3.8–10.8)

## 2016-02-05 LAB — TSH: TSH: 5.73 mIU/L — ABNORMAL HIGH

## 2016-02-05 MED ORDER — RIVAROXABAN 20 MG PO TABS: 20.0000 mg | ORAL_TABLET | Freq: Every day | ORAL | Status: DC

## 2016-02-05 NOTE — Patient Instructions (Signed)
Continue your current therapy  I will see you in 2 months  Try taking Prilosec OTC to see if it helps your chest burning.  Let me know if your shortness of breath or chest pain gets worse.

## 2016-02-06 ENCOUNTER — Ambulatory Visit (HOSPITAL_COMMUNITY)
Admission: RE | Admit: 2016-02-06 | Discharge: 2016-02-06 | Disposition: A | Discharge status: Home / Self Care | Payer: Medicare Other | Source: Ambulatory Visit | Attending: Internal Medicine | Admitting: Internal Medicine

## 2016-02-06 DIAGNOSIS — D62 Acute posthemorrhagic anemia: Secondary | ICD-10-CM | POA: Insufficient documentation

## 2016-02-06 DIAGNOSIS — D649 Anemia, unspecified: Secondary | ICD-10-CM | POA: Insufficient documentation

## 2016-02-06 DIAGNOSIS — I5033 Acute on chronic diastolic (congestive) heart failure: Secondary | ICD-10-CM | POA: Insufficient documentation

## 2016-02-06 LAB — ABO/RH: ABO/RH(D): A POS

## 2016-02-06 LAB — PREPARE RBC (CROSSMATCH)

## 2016-02-06 MED ORDER — SODIUM CHLORIDE 0.9 % IV SOLN: Freq: Once | INTRAVENOUS | Status: DC

## 2016-02-06 NOTE — Progress Notes (Signed)
PCP: Dr. Maurine Minister MD  Associated Diagnosis: Hgb; 7.1; Severe Aortic Stenosis  Procedure: Transfusion of 2 units PRBC completed.  Condition during transfusion: Education provided. Patient tolerated transfusion well. 2 units in. No adverse reactions. No shortness of breath. Alert, oriented and ambulatory at time of discharge. Walked to the bathroom several times. Went over discharge instructions and patient voiced understanding and a copy given to patient. Discharged to home, daughter picking up.

## 2016-02-06 NOTE — Discharge Instructions (Signed)

## 2016-02-06 NOTE — Progress Notes (Signed)
Cardiology Office Note   Date:  02/06/2016   ID:  Patricia [name], DOB Jun 05, 1936, MRN [id number]  PCP:  Estill Dooms, MD  Cardiologist:   Shadie Sweatman Martinique, MD   Chief Complaint  Patient presents with  . Follow-up    2 month  pt c/o SOB and burning pain in chest and between shoulder blades on exertion--goes away when she sits and calms down; swelling in both feet occasionally--LASIX helps; occasional random dizziness.      History of Present Illness: Patricia [name] is a 80 y.o. female who presents for follow up atrial fibrillation and severe aortic stenosis. She has a history of HTN, obesity, DM, HL, and anemia. She was admitted to the hospital in December with increased dyspnea. She was found to be in atrial fibrillation with RVR. She had pulmonary edema. Hgb was 10. Echo showed EF 55-60% with moderate to severe AS. She was anticoagulated with Xarelto and started on amiodarone. She had TEE guided DCCV with return to NSR. She was seen back in the office on 11/20/15 and was still in NSR. On 2/21.17 she underwent right shoulder arthroplasty. Post op Hgb was 9.6.  On follow up today she is still in an arm sling. She is seeing ortho today to possibly begin PT. She denies any palpitations. She does complain of increased SOB on exertion and exertional chest burning. This is relieved with rest. It is worse over the past 2 weeks. She has mild swelling in ankles and is using lasix. No dizziness.     Past Medical History  Diagnosis Date  . Allergy   . Arthritis   . Asthma   . Diabetes mellitus without complication ([place])   . Severe aortic stenosis   . Diarrhea   . Urge incontinence   . Lumbago   . Edema   . Cervicalgia   . Chronic airway obstruction, not elsewhere classified   . Pain in joint, lower leg   . Routine gynecological examination   . Scoliosis (and kyphoscoliosis), idiopathic   . Encounter for long-term (current) use of other medications   . Unspecified essential  hypertension   . Other and unspecified hyperlipidemia   . Benign neoplasm of colon   . Carpal tunnel syndrome   . Unspecified hemorrhoids without mention of complication   . Diffuse cystic mastopathy   . Unspecified hypothyroidism   . Obesity, unspecified   . Cellulitis and abscess of finger, unspecified   . Other psoriasis   . Type II or unspecified type diabetes mellitus without mention of complication, uncontrolled   . Cerumen impaction   . Neck pain 05/23/2015  . Anemia, unspecified   . Atrial fibrillation status post cardioversion [facility]) 09/2015    s/p TEE/DCCV>>SR on amio  . Chronic anticoagulation 09/2015    Xarelto for afib, CHADS2VASC=5  . PONV (postoperative nausea and vomiting)   . Dysrhythmia   . Heart murmur   . Shortness of breath dyspnea     with exertion  . Primary localized osteoarthrosis of right shoulder 12/18/2015    Past Surgical History  Procedure Laterality Date  . Breast surgery      right breast 3 surgeries [phone]  . Cholecystectomy  1992    DR BOWMAN  . Colon surgery      2004,2007,2013.  3 times colon surgieres  . Abdominal hysterectomy  1987    complete  . Vaginal cyst removed  1967  . 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 Foot surgery  1989  . Excise le mandibular lymph node t      DR C. NEWMAN  . Left shoulder arthroscopy    . Neuroplasty / transposition median nerve at carpal tunnel bilateral  05/2003  . Tee without cardioversion N/A 10/19/2015    Procedure: Transesophageal Echocardiogram (TEE) ;  Surgeon: Lelon Perla, MD;  Location: [facility] ENDOSCOPY;  Service: Cardiovascular;  Laterality: N/A;  . Cardioversion N/A 10/19/2015    Procedure: CARDIOVERSION;  Surgeon: Lelon Perla, MD;  Location: [facility] ENDOSCOPY;  Service: Cardiovascular;  Laterality: N/A;  . Trigger finger release Left   . Total shoulder arthroplasty Right 12/18/2015    Procedure: TOTAL SHOULDER ARTHROPLASTY;  Surgeon: [name], MD;  Location: [place];  Service:  Orthopedics;  Laterality: Right;     Current Outpatient Prescriptions  Medication Sig Dispense Refill  . ADVAIR DISKUS 100-50 MCG/DOSE AEPB Inhale 1 puff two times  daily for breathing 60 each 2  . albuterol (PROVENTIL HFA;VENTOLIN HFA) 108 (90 BASE) MCG/ACT inhaler Inhale 2 puffs into the lungs every 6 (six) hours as needed for wheezing or shortness of breath. 1 Inhaler 0  . amiodarone (PACERONE) 200 MG tablet Take one tablet daily by mouth 90 tablet 3  . cetirizine (ZYRTEC) 10 MG tablet Take 10 mg by mouth daily as needed for allergies.     . cholecalciferol (VITAMIN D) 1000 units tablet One daily for vitamin D supplement 100 tablet 4  . COMFORT LANCETS MISC Use daily to get blood for glucometer 100 each 3  . diltiazem (CARDIZEM CD) 180 MG 24 hr capsule Take 1 capsule by mouth  once a day to control blood pressure 90 capsule 3  . doxazosin (CARDURA) 4 MG tablet Take one tablet by mouth once daily to help control blood pressure 90 tablet 3  . furosemide (LASIX) 40 MG tablet Take 1 tablet on Mon, Wed, and Frid. May take additional dose as needed for increased swelling or weight gain >3 lbs in 24 hr 90 tablet 3  . glimepiride (AMARYL) 2 MG tablet Take 1 tablet by mouth at  breakfast to control  Glucose 90 tablet 3  . glucose blood (BAYER CONTOUR TEST) test strip Use daily too check glucose 100 each 4  . HYDROcodone-acetaminophen (NORCO) 10-325 MG tablet Take 1-2 tablets by mouth every 6 (six) hours as needed. 50 tablet 0  . ipratropium (ATROVENT) 0.06 % nasal spray Place 2 sprays into both nostrils 4 (four) times daily. 15 mL 12  . levothyroxine (SYNTHROID, LEVOTHROID) 50 MCG tablet Take 1 tablet by mouth  daily for thyroid  supplement 90 tablet 3  . lisinopril (PRINIVIL,ZESTRIL) 20 MG tablet Take 1 tablet by mouth once daily for blood pressure 90 tablet 3  . metFORMIN (GLUCOPHAGE) 500 MG tablet TAKE ONE TABLET BEFORE  BREAKFAST AND ONE TABLET  BEFORE SUPPER TO CONTROL  BLOOD SUGARS 180 tablet 3    . oxybutynin (DITROPAN) 5 MG tablet Take 5 mg by mouth 2 (two) times daily. To help control bladder    . Potassium 95 MG TABS Take 1 tablet by mouth 3 (three) times a week. Pt only takes when taking LASIX    . pravastatin (PRAVACHOL) 20 MG tablet Take 1 tablet by mouth  daily 90 tablet 3  . rivaroxaban (XARELTO) 20 MG TABS tablet Take 1 tablet (20 mg total) by mouth daily with supper. 90 tablet 6  . triamcinolone (NASACORT AQ) 55 MCG/ACT AERO nasal inhaler Place 2 sprays into the nose daily. (Patient  taking differently: Place 2 sprays into the nose daily as needed. ) 1 Inhaler 12   No current facility-administered medications for this visit.    Allergies:   Codeine and Vesicare    Social History:  The patient  reports that she quit smoking about 26 years ago. She has never used smokeless tobacco. She reports that she does not drink alcohol or use illicit drugs.   Family History:  The patient's family history includes Arthritis in her sister; Asthma in her sister; Diabetes in her father; Heart disease in her brother and father; Liver cancer in her sister; Stroke in her father.    ROS:  Please see the history of present illness.   Otherwise, review of systems are positive for none.   All other systems are reviewed and negative.    PHYSICAL EXAM: VS:  BP 132/52 mmHg  Pulse 77  Ht 5' 3" (1.6 m)  Wt 96.979 kg (213 lb 12.8 oz)  BMI 37.88 kg/m2 , BMI Body mass index is 37.88 kg/(m^2). GEN: Well nourished, well developed, in no acute distress HEENT: normal Neck: no JVD, carotid bruits, or masses Cardiac: RRR; harsh gr 3/6 systolic murmur RUSB. No gallop  Respiratory:  clear to auscultation bilaterally, normal work of breathing GI: soft, nontender, nondistended, + BS MS: no deformity or atrophy Skin: warm and dry, no rash. Trace LE edema.  Neuro:  Strength and sensation are intact Psych: euthymic mood, full affect   EKG:  EKG is ordered today. The ekg ordered today demonstrates NSR  with first degree AV block. LVH with repolarization abnormality. QTc 423 msec. I have personally reviewed and interpreted this study.    Recent Labs: Lab Results  Component Value Date   WBC 8.0 02/05/2016   HGB 7.1* 02/05/2016   HCT 22.8* 02/05/2016   PLT 323 02/05/2016   GLUCOSE 115* 02/05/2016   CHOL 165 09/24/2015   TRIG 119 09/24/2015   HDL 53 09/24/2015   LDLCALC 88 09/24/2015   ALT 17 10/19/2015   AST 16 10/19/2015   NA 137 02/05/2016   K 4.5 02/05/2016   CL 97* 02/05/2016   CREATININE 1.39* 02/05/2016   BUN 39* 02/05/2016   CO2 28 02/05/2016   TSH 5.73* 02/05/2016   INR 1.14 10/19/2015   HGBA1C 7.2* 12/07/2015      Lipid Panel    Component Value Date/Time   CHOL 165 09/24/2015 0916   TRIG 119 09/24/2015 0916   HDL 53 09/24/2015 0916   CHOLHDL 3.1 09/24/2015 0916   LDLCALC 88 09/24/2015 0916      Wt Readings from Last 3 Encounters:  02/05/16 96.979 kg (213 lb 12.8 oz)  01/23/16 95.709 kg (211 lb)  12/20/15 99.383 kg (219 lb 1.6 oz)      Other studies Reviewed: Additional studies/ records that were reviewed today include: none Review of the above records demonstrates: N/A   ASSESSMENT AND PLAN:  1.  Atrial fibrillation with RVR. S/p DCCV in December. Maintaining NSR on amiodarone. Chemistries are ok. TSH is mildly elevated. Will need to repeat in 3 months with free T4. Continue current amiodarone dose. Will need to hold Xarelto due to severe anemia. Heme check stools.   2. Severe Aortic stenosis. Discussed that she may need to be considered for AV surgery in the near future. She is clearly symptomatic but this may be related to her severe anemia. She will also need to recover from her recent shoulder surgery. Will follow up in 2 months and  discuss further.  3. Severe anemia. Post op Hgb 9.6 in Feb. Now 7.1. Will hold Xarelto. Heme check stools. Arrange transfusion of 2 units PRBCs since she is symptomatic and has severe AS. If stools are heme + will  need GI evaluation. Repeat CBC in one week post transfusion. Recommend prilosec 20 mg daily.  4. Acute on chronic renal failure. Probably exacerbated by blood loss and volume contraction. Will repeat after transfusion.  5. S/p right shoulder arthroplasty. Per ortho.   6. Chronic diastolic CHF. Not currently volume overloaded.  7. HTN  8. DM type 2.   9. Elevated TSH as noted above.    Current medicines are reviewed at length with the patient today.  The patient does not have concerns regarding medicines.  The following changes have been made:  See above  Labs/ tests ordered today include:  Orders Placed This Encounter  Procedures  . Basic metabolic panel  . CBC w/Diff/Platelet  . TSH  . EKG 12-Lead     Disposition:   FU with me in 2 months. Repeat CBC and BMET next week post 2 unit transfusion.   Signed, Jazzelle Zhang Martinique, MD  02/06/2016 7:22 AM    [facility] [address], [place], [place], [address] Phone [phone], Fax [phone]

## 2016-02-07 ENCOUNTER — Telehealth: Payer: Self-pay | Admitting: Cardiology

## 2016-02-07 DIAGNOSIS — E039 Hypothyroidism, unspecified: Secondary | ICD-10-CM

## 2016-02-07 DIAGNOSIS — I10 Essential (primary) hypertension: Secondary | ICD-10-CM

## 2016-02-07 DIAGNOSIS — D649 Anemia, unspecified: Secondary | ICD-10-CM

## 2016-02-07 LAB — TYPE AND SCREEN
ABO/RH(D): A POS
Antibody Screen: NEGATIVE
Unit division: 0
Unit division: 0

## 2016-02-07 NOTE — Telephone Encounter (Signed)
Returned call to patient.Stated she received 2 units of blood yesterday as ordered and that went well,no problems.Advised she can pick up hemosure stool collection envelope today.Advised to come to [facility] office front desk and ask for me.Advised I will give her orders to have repeat cbc and bmet in 1 week.Also I will give her orders to have repeat tsh,free t4 in 3 months.

## 2016-02-07 NOTE — Telephone Encounter (Signed)
Pt is calling in stating that she is suppose to suppose to receive some instructions on a stool sample and would like to speak to you about this. Please f/u with the pt.  Thanks

## 2016-02-13 ENCOUNTER — Telehealth: Payer: Self-pay | Admitting: Cardiology

## 2016-02-13 DIAGNOSIS — M6281 Muscle weakness (generalized): Secondary | ICD-10-CM | POA: Diagnosis not present

## 2016-02-13 DIAGNOSIS — M19011 Primary osteoarthritis, right shoulder: Secondary | ICD-10-CM | POA: Diagnosis not present

## 2016-02-13 DIAGNOSIS — M25511 Pain in right shoulder: Secondary | ICD-10-CM | POA: Diagnosis not present

## 2016-02-13 DIAGNOSIS — M25611 Stiffness of right shoulder, not elsewhere classified: Secondary | ICD-10-CM | POA: Diagnosis not present

## 2016-02-13 DIAGNOSIS — I10 Essential (primary) hypertension: Secondary | ICD-10-CM | POA: Diagnosis not present

## 2016-02-13 DIAGNOSIS — D649 Anemia, unspecified: Secondary | ICD-10-CM | POA: Diagnosis not present

## 2016-02-13 NOTE — Telephone Encounter (Signed)
Patient only needs cbc, bmp at present time- today

## 2016-02-13 NOTE — Telephone Encounter (Signed)
New message     [facility] calling - does additional lab needs to be drawn from what they see in system'   CBC/ BNP - patient paperwork

## 2016-02-14 DIAGNOSIS — I10 Essential (primary) hypertension: Secondary | ICD-10-CM | POA: Diagnosis not present

## 2016-02-14 DIAGNOSIS — D649 Anemia, unspecified: Secondary | ICD-10-CM | POA: Diagnosis not present

## 2016-02-14 LAB — CBC WITH DIFFERENTIAL/PLATELET
Basophils Absolute: 0 cells/uL (ref 0–200)
Basophils Relative: 0 %
Eosinophils Absolute: 146 cells/uL (ref 15–500)
Eosinophils Relative: 2 %
HCT: 28.7 % — ABNORMAL LOW (ref 35.0–45.0)
Hemoglobin: 9.2 g/dL — ABNORMAL LOW (ref 11.7–15.5)
Lymphocytes Relative: 19 %
Lymphs Abs: 1387 cells/uL (ref 850–3900)
MCH: 28 pg (ref 27.0–33.0)
MCHC: 32.1 g/dL (ref 32.0–36.0)
MCV: 87.2 fL (ref 80.0–100.0)
MPV: 9.9 fL (ref 7.5–12.5)
Monocytes Absolute: 511 cells/uL (ref 200–950)
Monocytes Relative: 7 %
Neutro Abs: 5256 cells/uL (ref 1500–7800)
Neutrophils Relative %: 72 %
Platelets: 322 10*3/uL (ref 140–400)
RBC: 3.29 MIL/uL — ABNORMAL LOW (ref 3.80–5.10)
RDW: 16.2 % — ABNORMAL HIGH (ref 11.0–15.0)
WBC: 7.3 10*3/uL (ref 3.8–10.8)

## 2016-02-14 LAB — BASIC METABOLIC PANEL
BUN: 42 mg/dL — ABNORMAL HIGH (ref 7–25)
CO2: 28 mmol/L (ref 20–31)
Calcium: 9 mg/dL (ref 8.6–10.4)
Chloride: 100 mmol/L (ref 98–110)
Creat: 1.57 mg/dL — ABNORMAL HIGH (ref 0.60–0.93)
Glucose, Bld: 128 mg/dL — ABNORMAL HIGH (ref 65–99)
Potassium: 4.7 mmol/L (ref 3.5–5.3)
Sodium: 139 mmol/L (ref 135–146)

## 2016-02-15 ENCOUNTER — Other Ambulatory Visit: Payer: Self-pay

## 2016-02-15 DIAGNOSIS — R195 Other fecal abnormalities: Secondary | ICD-10-CM

## 2016-02-15 DIAGNOSIS — D649 Anemia, unspecified: Secondary | ICD-10-CM

## 2016-02-15 DIAGNOSIS — I10 Essential (primary) hypertension: Secondary | ICD-10-CM

## 2016-02-15 LAB — FECAL OCCULT BLOOD, IMMUNOCHEMICAL: Fecal Occult Blood: POSITIVE — AB

## 2016-02-15 MED ORDER — FERROUS SULFATE 325 (65 FE) MG PO TBEC
325.0000 mg | DELAYED_RELEASE_TABLET | Freq: Every day | ORAL | Status: DC
Start: 2016-02-15 — End: 2016-07-01

## 2016-02-18 ENCOUNTER — Encounter: Payer: Self-pay | Admitting: Gastroenterology

## 2016-02-18 ENCOUNTER — Telehealth: Payer: Self-pay | Admitting: Cardiology

## 2016-02-18 NOTE — Telephone Encounter (Signed)
Spoke to patient, aware Dr. Martinique recommended GI f/u asap for heme pos occult result.  She is aware I have dispatched a message to scheduling for high priority placement.

## 2016-02-18 NOTE — Telephone Encounter (Signed)
New message      Calling to check on the status on the referral to a stomach doctor.  She has not heard from anyone regarding an appt.  Please call

## 2016-02-19 ENCOUNTER — Telehealth: Payer: Self-pay | Admitting: Cardiology

## 2016-02-19 NOTE — Telephone Encounter (Signed)
Returned pt call - she received call from [facility] yesterday to set up for appt - 2 months out.   Informed her I would call them to communicate need for more urgent evaluation.  I called [facility], operator informed me of cancellation this Fri and that pt could be added in to see Dr. Loletha Carrow. I took appt. Confirmed appt details w patient. Gave her contact and address info for GI office. Advised to call for further needs. Pt voiced thanks and acknowledged information given.

## 2016-02-19 NOTE — Telephone Encounter (Signed)
New message      Talk to the nurse regarding an outside referral that was made

## 2016-02-20 DIAGNOSIS — M25611 Stiffness of right shoulder, not elsewhere classified: Secondary | ICD-10-CM | POA: Diagnosis not present

## 2016-02-20 DIAGNOSIS — M19011 Primary osteoarthritis, right shoulder: Secondary | ICD-10-CM | POA: Diagnosis not present

## 2016-02-20 DIAGNOSIS — M25511 Pain in right shoulder: Secondary | ICD-10-CM | POA: Diagnosis not present

## 2016-02-20 DIAGNOSIS — M6281 Muscle weakness (generalized): Secondary | ICD-10-CM | POA: Diagnosis not present

## 2016-02-22 ENCOUNTER — Encounter: Payer: Self-pay | Admitting: Gastroenterology

## 2016-02-22 ENCOUNTER — Ambulatory Visit (INDEPENDENT_AMBULATORY_CARE_PROVIDER_SITE_OTHER): Payer: Medicare Other | Admitting: Gastroenterology

## 2016-02-22 ENCOUNTER — Telehealth: Payer: Self-pay | Admitting: Physician Assistant

## 2016-02-22 VITALS — BP 130/68 | HR 72 | Ht 63.0 in | Wt 214.0 lb

## 2016-02-22 DIAGNOSIS — Z7901 Long term (current) use of anticoagulants: Secondary | ICD-10-CM | POA: Insufficient documentation

## 2016-02-22 DIAGNOSIS — M6281 Muscle weakness (generalized): Secondary | ICD-10-CM | POA: Diagnosis not present

## 2016-02-22 DIAGNOSIS — I350 Nonrheumatic aortic (valve) stenosis: Secondary | ICD-10-CM | POA: Diagnosis not present

## 2016-02-22 DIAGNOSIS — D500 Iron deficiency anemia secondary to blood loss (chronic): Secondary | ICD-10-CM

## 2016-02-22 DIAGNOSIS — M25611 Stiffness of right shoulder, not elsewhere classified: Secondary | ICD-10-CM | POA: Diagnosis not present

## 2016-02-22 DIAGNOSIS — R195 Other fecal abnormalities: Secondary | ICD-10-CM

## 2016-02-22 DIAGNOSIS — M25511 Pain in right shoulder: Secondary | ICD-10-CM | POA: Diagnosis not present

## 2016-02-22 DIAGNOSIS — M19011 Primary osteoarthritis, right shoulder: Secondary | ICD-10-CM | POA: Diagnosis not present

## 2016-02-22 NOTE — Progress Notes (Signed)
[facility] Consult Note:  History: Patricia [name] 02/22/2016  Referring physician: Estill Dooms, MD  Reason for consult/chief complaint: Blood In Stools   Subjective HPI:  Referred for anemia and heme positive stool.  This patient was recently found to have a hemoglobin of 7.1 with a normal MCV. She was started on iron tablets, Xarelto was stopped, and stool was positive for occult blood. She has not seen any frank rectal bleeding, denies abdominal pain or change in bowel habits. She denies dysphagia, odynophagia, nausea, vomiting, early satiety or weight loss. She is getting more dyspneic with exertion over the last several months, and apparently has severe aortic stenosis according to her recent cardiology note.   ROS:  Review of Systems  Constitutional: Negative for appetite change and unexpected weight change.  HENT: Negative for mouth sores and voice change.   Eyes: Negative for pain and redness.  Respiratory: Positive for shortness of breath. Negative for cough.   Cardiovascular: Negative for chest pain and palpitations.  Genitourinary: Negative for dysuria and hematuria.  Musculoskeletal: Negative for myalgias and arthralgias.  Skin: Negative for pallor and rash.  Neurological: Negative for weakness and headaches.  Hematological: Negative for adenopathy.     Past Medical History: Past Medical History  Diagnosis Date  . Allergy   . Arthritis   . Asthma   . Diabetes mellitus without complication ([place])   . Severe aortic stenosis   . Diarrhea   . Urge incontinence   . Lumbago   . Edema   . Cervicalgia   . Chronic airway obstruction, not elsewhere classified   . Pain in joint, lower leg   . Routine gynecological examination   . Scoliosis (and kyphoscoliosis), idiopathic   . Encounter for long-term (current) use of other medications   . Unspecified essential hypertension   . Other and unspecified hyperlipidemia   . Benign neoplasm of colon   .  Carpal tunnel syndrome   . Unspecified hemorrhoids without mention of complication   . Diffuse cystic mastopathy   . Unspecified hypothyroidism   . Obesity, unspecified   . Cellulitis and abscess of finger, unspecified   . Other psoriasis   . Type II or unspecified type diabetes mellitus without mention of complication, uncontrolled   . Cerumen impaction   . Neck pain 05/23/2015  . Anemia, unspecified   . Atrial fibrillation status post cardioversion [facility]) 09/2015    s/p TEE/DCCV>>SR on amio  . Chronic anticoagulation 09/2015    Xarelto for afib, CHADS2VASC=5  . PONV (postoperative nausea and vomiting)   . Dysrhythmia   . Heart murmur   . Shortness of breath dyspnea     with exertion  . Primary localized osteoarthrosis of right shoulder 12/18/2015    Her most recent cardiology no with Dr. Martinique was reviewed. Past Surgical History: Past Surgical History  Procedure Laterality Date  . Breast surgery      right breast 3 surgeries [phone]  . Cholecystectomy  1992    DR BOWMAN  . Colon surgery      2004,2007,2013.  3 times colon surgieres  . Abdominal hysterectomy  1987    complete  . Vaginal cyst removed  1967  . 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 Foot surgery  1989  . Excise le mandibular lymph node t      DR C. NEWMAN  . Left shoulder arthroscopy    . Neuroplasty / transposition median nerve at carpal tunnel bilateral  05/2003  . Tee without  cardioversion N/A 10/19/2015    Procedure: Transesophageal Echocardiogram (TEE) ;  Surgeon: Lelon Perla, MD;  Location: [facility] ENDOSCOPY;  Service: Cardiovascular;  Laterality: N/A;  . Cardioversion N/A 10/19/2015    Procedure: CARDIOVERSION;  Surgeon: Lelon Perla, MD;  Location: [facility] ENDOSCOPY;  Service: Cardiovascular;  Laterality: N/A;  . Trigger finger release Left   . Total shoulder arthroplasty Right 12/18/2015    Procedure: TOTAL SHOULDER ARTHROPLASTY;  Surgeon: [name], MD;  Location: [place];  Service: Orthopedics;   Laterality: Right;     Family History: Family History  Problem Relation Age of Onset  . Diabetes Father   . Stroke Father   . Heart disease Father   . Liver cancer Sister   . Heart disease Brother   . Asthma Sister   . Arthritis Sister     Social History: Social History   Social History  . Marital Status: Divorced    Spouse Name: N/A  . Number of Children: 1  . Years of Education: N/A   Occupational History  . Retired Market researcher for [employer] History Main Topics  . Smoking status: Former Smoker    Quit date: 09/07/1989  . Smokeless tobacco: Never Used  . Alcohol Use: No  . Drug Use: No  . Sexual Activity: No   Other Topics Concern  . None   Social History Narrative   Her daughter & 3 grandchildren live in the area.      Allergies: Allergies  Allergen Reactions  . Codeine Other (See Comments)    "spaces her out, dizziness, can't walk well"  . Vesicare [Solifenacin] Other (See Comments)    unknown    Outpatient Meds: Current Outpatient Prescriptions  Medication Sig Dispense Refill  . ADVAIR DISKUS 100-50 MCG/DOSE AEPB Inhale 1 puff two times  daily for breathing 60 each 2  . albuterol (PROVENTIL HFA;VENTOLIN HFA) 108 (90 BASE) MCG/ACT inhaler Inhale 2 puffs into the lungs every 6 (six) hours as needed for wheezing or shortness of breath. 1 Inhaler 0  . amiodarone (PACERONE) 200 MG tablet Take one tablet daily by mouth 90 tablet 3  . cetirizine (ZYRTEC) 10 MG tablet Take 10 mg by mouth daily as needed for allergies.     . cholecalciferol (VITAMIN D) 1000 units tablet One daily for vitamin D supplement 100 tablet 4  . COMFORT LANCETS MISC Use daily to get blood for glucometer 100 each 3  . diltiazem (CARDIZEM CD) 180 MG 24 hr capsule Take 1 capsule by mouth  once a day to control blood pressure 90 capsule 3  . doxazosin (CARDURA) 4 MG tablet Take one tablet by mouth once daily to help control blood pressure 90 tablet 3  . ferrous  sulfate 325 (65 FE) MG EC tablet Take 1 tablet (325 mg total) by mouth daily with breakfast. 30 tablet 3  . furosemide (LASIX) 40 MG tablet Take 1 tablet on Mon, Wed, and Frid. May take additional dose as needed for increased swelling or weight gain >3 lbs in 24 hr 90 tablet 3  . glimepiride (AMARYL) 2 MG tablet Take 1 tablet by mouth at  breakfast to control  Glucose 90 tablet 3  . glucose blood (BAYER CONTOUR TEST) test strip Use daily too check glucose 100 each 4  . HYDROcodone-acetaminophen (NORCO) 10-325 MG tablet Take 1-2 tablets by mouth every 6 (six) hours as needed. 50 tablet 0  . ipratropium (ATROVENT) 0.06 % nasal spray Place 2 sprays  into both nostrils 4 (four) times daily. 15 mL 12  . levothyroxine (SYNTHROID, LEVOTHROID) 50 MCG tablet Take 1 tablet by mouth  daily for thyroid  supplement 90 tablet 3  . metFORMIN (GLUCOPHAGE) 500 MG tablet TAKE ONE TABLET BEFORE  BREAKFAST AND ONE TABLET  BEFORE SUPPER TO CONTROL  BLOOD SUGARS 180 tablet 3  . oxybutynin (DITROPAN) 5 MG tablet Take 5 mg by mouth 2 (two) times daily. To help control bladder    . Potassium 95 MG TABS Take 1 tablet by mouth 3 (three) times a week. Pt only takes when taking LASIX    . pravastatin (PRAVACHOL) 20 MG tablet Take 1 tablet by mouth  daily 90 tablet 3  . triamcinolone (NASACORT AQ) 55 MCG/ACT AERO nasal inhaler Place 2 sprays into the nose daily. (Patient taking differently: Place 2 sprays into the nose daily as needed. ) 1 Inhaler 12  . rivaroxaban (XARELTO) 20 MG TABS tablet Take 1 tablet (20 mg total) by mouth daily with supper. (Patient not taking: Reported on 02/22/2016) 90 tablet 6   No current facility-administered medications for this visit.      ___________________________________________________________________ Objective  Exam:  BP 130/68 mmHg  Pulse 72  Ht 5' 3" (1.6 m)  Wt 214 lb (97.07 kg)  BMI 37.92 kg/m2   General: this is a(n) Well-appearing elderly woman, good muscle mass   Eyes:  sclera anicteric, no redness  ENT: oral mucosa moist without lesions, no cervical or supraclavicular lymphadenopathy, good dentition  CV: RRR with a harsh systolic murmur,, no JVD, no peripheral edema  Resp: clear to auscultation bilaterally, normal RR and effort noted  GI: soft, obese, soft tenderness, with active bowel sounds. No guarding or palpable organomegaly noted, limited by abdominal girth.  Skin; warm and dry, no rash or jaundice noted  Neuro: awake, alert and oriented x 3. Normal gross motor function and fluent speech  Labs:  Lab Results  Component Value Date   WBC 7.3 02/13/2016   HGB 9.2* 02/13/2016   HCT 28.7* 02/13/2016   MCV 87.2 02/13/2016   PLT 322 02/13/2016  Hemoglobin 7.1 prior to transfusion  Radiologic Studies:  Colon with Acquanetta Sit in [facility] Oct 2013, one small polyp, severe sigmoid diverticulosis with tortuosity was noted in the report.  Assessment: Encounter Diagnoses  Name Primary?  Marland Kitchen Anemia due to chronic blood loss Yes  . Heme positive stool   . Aortic stenosis   . Chronic anticoagulation     This patient needs EGD and colonoscopy to workup the source of chronic occult GI blood loss. However, she is adamant that she see her regular GI doctor in [facility] to have this done. She has had her last 2 colonoscopies with Dr. Shana Chute and feels very comfortable with her. It sounds like she came to this office visit at the urging of her cardiologist, because we are in the same health system.  Plan:  Ms. Zuelke says she she is already called Dr. Wilber Oliphant office, and at the PA can see her next week. I have encouraged her to do so, and for our part, with we will fax this office note to her as well as Dr. Doug Sou recent cardiology note.    Thank you for the courtesy of this consult.  Please call me with any questions or concerns.  Nelida Meuse III

## 2016-02-22 NOTE — Patient Instructions (Signed)
We will send records to your GI doctor in [facility].   Thank you for choosing [facility]  Dr Wilfrid Lund III

## 2016-02-27 DIAGNOSIS — M25611 Stiffness of right shoulder, not elsewhere classified: Secondary | ICD-10-CM | POA: Diagnosis not present

## 2016-02-27 DIAGNOSIS — M6281 Muscle weakness (generalized): Secondary | ICD-10-CM | POA: Diagnosis not present

## 2016-02-27 DIAGNOSIS — M19011 Primary osteoarthritis, right shoulder: Secondary | ICD-10-CM | POA: Diagnosis not present

## 2016-02-27 DIAGNOSIS — M25511 Pain in right shoulder: Secondary | ICD-10-CM | POA: Diagnosis not present

## 2016-02-28 DIAGNOSIS — R195 Other fecal abnormalities: Secondary | ICD-10-CM | POA: Diagnosis not present

## 2016-02-28 DIAGNOSIS — D500 Iron deficiency anemia secondary to blood loss (chronic): Secondary | ICD-10-CM | POA: Diagnosis not present

## 2016-02-28 DIAGNOSIS — Z86010 Personal history of colonic polyps: Secondary | ICD-10-CM | POA: Diagnosis not present

## 2016-02-29 DIAGNOSIS — D649 Anemia, unspecified: Secondary | ICD-10-CM | POA: Diagnosis not present

## 2016-02-29 DIAGNOSIS — I10 Essential (primary) hypertension: Secondary | ICD-10-CM | POA: Diagnosis not present

## 2016-02-29 NOTE — Telephone Encounter (Signed)
Closed encounter

## 2016-03-01 LAB — CBC WITH DIFFERENTIAL/PLATELET
Basophils Absolute: 70 cells/uL (ref 0–200)
Basophils Relative: 1 %
Eosinophils Absolute: 140 cells/uL (ref 15–500)
Eosinophils Relative: 2 %
HCT: 30.1 % — ABNORMAL LOW (ref 35.0–45.0)
Hemoglobin: 9.6 g/dL — ABNORMAL LOW (ref 11.7–15.5)
Lymphocytes Relative: 35 %
Lymphs Abs: 2450 cells/uL (ref 850–3900)
MCH: 27.1 pg (ref 27.0–33.0)
MCHC: 31.9 g/dL — ABNORMAL LOW (ref 32.0–36.0)
MCV: 85 fL (ref 80.0–100.0)
MPV: 11.8 fL (ref 7.5–12.5)
Monocytes Absolute: 630 cells/uL (ref 200–950)
Monocytes Relative: 9 %
Neutro Abs: 3710 cells/uL (ref 1500–7800)
Neutrophils Relative %: 53 %
Platelets: 350 10*3/uL (ref 140–400)
RBC: 3.54 MIL/uL — ABNORMAL LOW (ref 3.80–5.10)
RDW: 15.8 % — ABNORMAL HIGH (ref 11.0–15.0)
WBC: 7 10*3/uL (ref 3.8–10.8)

## 2016-03-01 LAB — BASIC METABOLIC PANEL
BUN: 30 mg/dL — ABNORMAL HIGH (ref 7–25)
CO2: 29 mmol/L (ref 20–31)
Calcium: 9.4 mg/dL (ref 8.6–10.4)
Chloride: 102 mmol/L (ref 98–110)
Creat: 1 mg/dL — ABNORMAL HIGH (ref 0.60–0.93)
Glucose, Bld: 57 mg/dL — ABNORMAL LOW (ref 65–99)
Potassium: 4.5 mmol/L (ref 3.5–5.3)
Sodium: 140 mmol/L (ref 135–146)

## 2016-03-03 DIAGNOSIS — M6281 Muscle weakness (generalized): Secondary | ICD-10-CM | POA: Diagnosis not present

## 2016-03-03 DIAGNOSIS — M19011 Primary osteoarthritis, right shoulder: Secondary | ICD-10-CM | POA: Diagnosis not present

## 2016-03-03 DIAGNOSIS — M25511 Pain in right shoulder: Secondary | ICD-10-CM | POA: Diagnosis not present

## 2016-03-03 DIAGNOSIS — M25611 Stiffness of right shoulder, not elsewhere classified: Secondary | ICD-10-CM | POA: Diagnosis not present

## 2016-03-04 ENCOUNTER — Ambulatory Visit: Payer: Medicare Other | Admitting: Physician Assistant

## 2016-03-04 DIAGNOSIS — R208 Other disturbances of skin sensation: Secondary | ICD-10-CM | POA: Diagnosis not present

## 2016-03-04 DIAGNOSIS — G5622 Lesion of ulnar nerve, left upper limb: Secondary | ICD-10-CM | POA: Diagnosis not present

## 2016-03-05 ENCOUNTER — Ambulatory Visit (INDEPENDENT_AMBULATORY_CARE_PROVIDER_SITE_OTHER): Payer: Medicare Other | Admitting: Physician Assistant

## 2016-03-05 ENCOUNTER — Encounter: Payer: Self-pay | Admitting: Physician Assistant

## 2016-03-05 VITALS — BP 142/61 | HR 74 | Ht 63.0 in | Wt 217.0 lb

## 2016-03-05 DIAGNOSIS — I10 Essential (primary) hypertension: Secondary | ICD-10-CM

## 2016-03-05 DIAGNOSIS — I480 Paroxysmal atrial fibrillation: Secondary | ICD-10-CM | POA: Diagnosis not present

## 2016-03-05 DIAGNOSIS — E039 Hypothyroidism, unspecified: Secondary | ICD-10-CM | POA: Diagnosis not present

## 2016-03-05 DIAGNOSIS — M19011 Primary osteoarthritis, right shoulder: Secondary | ICD-10-CM | POA: Diagnosis not present

## 2016-03-05 NOTE — Patient Instructions (Signed)
Your physician recommends that you return for lab work 2-3 days prior to your June appointment with Dr Martinique.  Your physician recommends that you keep your  follow-up appointment in June with Dr Martinique.

## 2016-03-05 NOTE — Progress Notes (Signed)
Cardiology Office Note   Date:  03/05/2016   ID:  Patricia [name], DOB May 26, 1936, MRN [id number]  PCP:  Estill Dooms, MD  Cardiologist:  Dr Martinique  Barrett, Rhonda, PA-C   Chief Complaint  Patient presents with  . Follow-up    No longer coughing. Occ shortness of breath, occ lightheadedness    History of Present Illness: Patricia [name] is a 80 y.o. female with a history of afib (s/p TEE/DCCV 09/2015 and started on amio), anticoag w/ Xarelto, AS, HTN, DM, HLD, obesity, anemia, D-CHF. EF 55-60% w/ mod-severe AS by echo 09/2015.   Seen in office 04/12 and Xarelto held w/ severe anemia, s/p transfusion, AoC RI w/ improvement to 30/1.00 by 05/05. TSH slightly elevated, repeat in 3 months. Heme +, seen by GI and EGD/Colon recommended.  Patricia [name] presents for Follow-up.   Since the transfusion, she feels better. She has dyspnea on exertion that may be a little worse than usual. She denies PND. She has some chronic orthopnea. She has not had lower extremity edema. She takes her Lasix daily. She does not have salt to foods but eats enough. Foods she may be getting some hidden sodium. She is not over drinking.  She is taking iron daily and her stools are a bit dark from that. She has an EGD and colonoscopy scheduled for 05/24 with her GI doctor in [facility].   She was having some chest discomfort that she felt was reflux. She started an over-the-counter medication for reflux, and her symptoms resolved.  She has not had chest pain. She has not had palpitations and does not think she has had any atrial fibrillation. She is aware that her TSH was mildly elevated and has follow-up labs scheduled. She has not had presyncope or syncope. She has not had exertional chest pain.   Past Medical History  Diagnosis Date  . Allergy   . Arthritis   . Asthma   . Diabetes mellitus without complication ([place])   . Severe aortic stenosis   . Diarrhea   . Urge incontinence   . Lumbago   .  Edema   . Cervicalgia   . Chronic airway obstruction, not elsewhere classified   . Pain in joint, lower leg   . Routine gynecological examination   . Scoliosis (and kyphoscoliosis), idiopathic   . Encounter for long-term (current) use of other medications   . Unspecified essential hypertension   . Other and unspecified hyperlipidemia   . Benign neoplasm of colon   . Carpal tunnel syndrome   . Unspecified hemorrhoids without mention of complication   . Diffuse cystic mastopathy   . Unspecified hypothyroidism   . Obesity, unspecified   . Cellulitis and abscess of finger, unspecified   . Other psoriasis   . Type II or unspecified type diabetes mellitus without mention of complication, uncontrolled   . Cerumen impaction   . Neck pain 05/23/2015  . Anemia, unspecified   . Atrial fibrillation status post cardioversion [facility]) 09/2015    s/p TEE/DCCV>>SR on amio  . Chronic anticoagulation 09/2015    Xarelto for afib, CHADS2VASC=5  . PONV (postoperative nausea and vomiting)   . Dysrhythmia   . Heart murmur   . Shortness of breath dyspnea     with exertion  . Primary localized osteoarthrosis of right shoulder 12/18/2015    Past Surgical History  Procedure Laterality Date  . Breast surgery      right breast 3 surgeries [phone]  .  Cholecystectomy  1992    DR BOWMAN  . Colon surgery      2004,2007,2013.  3 times colon surgieres  . Abdominal hysterectomy  1987    complete  . Vaginal cyst removed  1967  . 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 Foot surgery  1989  . Excise le mandibular lymph node t      DR C. NEWMAN  . Left shoulder arthroscopy    . Neuroplasty / transposition median nerve at carpal tunnel bilateral  05/2003  . Tee without cardioversion N/A 10/19/2015    Procedure: Transesophageal Echocardiogram (TEE) ;  Surgeon: Lelon Perla, MD;  Location: [facility] ENDOSCOPY;  Service: Cardiovascular;  Laterality: N/A;  . Cardioversion N/A 10/19/2015    Procedure: CARDIOVERSION;   Surgeon: Lelon Perla, MD;  Location: [facility] ENDOSCOPY;  Service: Cardiovascular;  Laterality: N/A;  . Trigger finger release Left   . Total shoulder arthroplasty Right 12/18/2015    Procedure: TOTAL SHOULDER ARTHROPLASTY;  Surgeon: [name], MD;  Location: [place];  Service: Orthopedics;  Laterality: Right;    Current Outpatient Prescriptions  Medication Sig Dispense Refill  . ADVAIR DISKUS 100-50 MCG/DOSE AEPB Inhale 1 puff two times  daily for breathing 60 each 2  . albuterol (PROVENTIL HFA;VENTOLIN HFA) 108 (90 BASE) MCG/ACT inhaler Inhale 2 puffs into the lungs every 6 (six) hours as needed for wheezing or shortness of breath. 1 Inhaler 0  . amiodarone (PACERONE) 200 MG tablet Take one tablet daily by mouth 90 tablet 3  . cetirizine (ZYRTEC) 10 MG tablet Take 10 mg by mouth daily as needed for allergies.     . cholecalciferol (VITAMIN D) 1000 units tablet One daily for vitamin D supplement 100 tablet 4  . COMFORT LANCETS MISC Use daily to get blood for glucometer 100 each 3  . diltiazem (CARDIZEM CD) 180 MG 24 hr capsule Take 1 capsule by mouth  once a day to control blood pressure 90 capsule 3  . doxazosin (CARDURA) 4 MG tablet Take one tablet by mouth once daily to help control blood pressure 90 tablet 3  . ferrous sulfate 325 (65 FE) MG EC tablet Take 1 tablet (325 mg total) by mouth daily with breakfast. 30 tablet 3  . furosemide (LASIX) 40 MG tablet Take 1 tablet DAILY 90 tablet 3  . glimepiride (AMARYL) 2 MG tablet Take 1 tablet by mouth at  breakfast to control  Glucose 90 tablet 3  . glucose blood (BAYER CONTOUR TEST) test strip Use daily too check glucose 100 each 4  . levothyroxine (SYNTHROID, LEVOTHROID) 50 MCG tablet Take 1 tablet by mouth  daily for thyroid  supplement 90 tablet 3  . metFORMIN (GLUCOPHAGE) 500 MG tablet TAKE ONE TABLET BEFORE  BREAKFAST AND ONE TABLET  BEFORE SUPPER TO CONTROL  BLOOD SUGARS 180 tablet 3  . oxybutynin (DITROPAN) 5 MG tablet Take 5 mg by mouth  2 (two) times daily. To help control bladder    . Potassium 95 MG TABS Take 1 tablet by mouth 3 (three) times a week. Pt only takes when taking LASIX    . pravastatin (PRAVACHOL) 20 MG tablet Take 1 tablet by mouth  daily 90 tablet 3   No current facility-administered medications for this visit.    Allergies:   Codeine and Vesicare    Social History:  The patient  reports that she quit smoking about 26 years ago. She has never used smokeless tobacco. She reports that she does not drink alcohol  or use illicit drugs.   Family History:  The patient's family history includes Arthritis in her sister; Asthma in her sister; Diabetes in her father; Heart disease in her brother and father; Liver cancer in her sister; Stroke in her father.    ROS:  Please see the history of present illness. All other systems are reviewed and negative.    PHYSICAL EXAM: VS:  BP 142/61 mmHg  Pulse 74  Ht _0  (1.6 m)  Wt 217 lb (98.431 kg)  BMI 38.45 kg/m2 , BMI Body mass index is 38.45 kg/(m^2). GEN: Well nourished, well developed, female in no acute distress HEENT: normal for age  Neck:  Minimal JVD, no carotid bruit but murmur radiates to both carotids, no masses Cardiac: RRR; 3/6 murmur, no rubs, or gallops Respiratory: Few rales bases bilaterally, normal work of breathing GI: soft, nontender, nondistended, + BS MS: no deformity or atrophy; no edema; distal pulses are 2+ in all 4 extremities  Skin: warm and dry, no rash Neuro:  Strength and sensation are intact Psych: euthymic mood, full affect   EKG:  EKG is not ordered today.  Recent Labs: 10/18/2015: B Natriuretic Peptide 436.0* 10/19/2015: ALT 17; Magnesium 1.8 02/05/2016: TSH 5.73* 02/29/2016: BUN 30*; Creat 1.00*; Hemoglobin 9.6*; Platelets 350; Potassium 4.5; Sodium 140    Lipid Panel    Component Value Date/Time   CHOL 165 09/24/2015 0916   TRIG 119 09/24/2015 0916   HDL 53 09/24/2015 0916   CHOLHDL 3.1 09/24/2015 0916   LDLCALC 88  09/24/2015 0916     Wt Readings from Last 3 Encounters:  03/05/16 217 lb (98.431 kg)  02/22/16 214 lb (97.07 kg)  02/05/16 213 lb 12.8 oz (96.979 kg)     Other studies Reviewed: Additional studies/ records that were reviewed today include: Office notes and testing  ASSESSMENT AND PLAN:  1.  Severe aortic stenosis: She is not having any symptoms clearly attributable to the AS except for dyspnea on exertion. She understands the need to contact us if she gets presyncope, syncope or chest pain. We will need to follow her echo. If she develops additional symptoms, surgical/TAVR referral may be appropriate.  2. Chronic diastolic CHF: Her weight is up slightly. She admits that she may be getting hidden salt to foods. She has a mild cough and slightly increased dyspnea on exertion. She is currently taking Lasix at 40 mg a day. Recent labs showed a normal potassium level and renal function minimally changed from baseline. Continue current therapy and daily weights. She is encouraged to read labels to make sure she does not get hidden salt.   She has not taken her Lasix today and so does not feel that she has gained a significant amount of weight. She is compliant with her medications, but takes it later in the day when she has an appointment.  3. Paroxysmal atrial fibrillation: She is not having any symptoms reminiscent of her A. fib. She is continuing the amiodarone at 200 mg daily and Cardizem CD 180 mg daily  4.Chronic anticoagulation: CHADS2VASC=5. She is currently off Xarelto because of the anemia.   Current medicines are reviewed at length with the patient today.  The patient does not have concerns regarding medicines.  The following changes have been made:  no change  Labs/ tests ordered today include:   Orders Placed This Encounter  Procedures  . CBC  . Comprehensive metabolic panel  . TSH     Disposition:   FU with Dr.  Martinique as scheduled  Augusto Garbe    03/05/2016 9:56 AM    [facility] Phone: [phone]; Fax: [phone]  This note was written with the assistance of speech recognition software. Please excuse any transcriptional errors.

## 2016-03-06 DIAGNOSIS — M25511 Pain in right shoulder: Secondary | ICD-10-CM | POA: Diagnosis not present

## 2016-03-06 DIAGNOSIS — M19011 Primary osteoarthritis, right shoulder: Secondary | ICD-10-CM | POA: Diagnosis not present

## 2016-03-06 DIAGNOSIS — M6281 Muscle weakness (generalized): Secondary | ICD-10-CM | POA: Diagnosis not present

## 2016-03-06 DIAGNOSIS — M25611 Stiffness of right shoulder, not elsewhere classified: Secondary | ICD-10-CM | POA: Diagnosis not present

## 2016-03-10 DIAGNOSIS — M25611 Stiffness of right shoulder, not elsewhere classified: Secondary | ICD-10-CM | POA: Diagnosis not present

## 2016-03-10 DIAGNOSIS — M25511 Pain in right shoulder: Secondary | ICD-10-CM | POA: Diagnosis not present

## 2016-03-10 DIAGNOSIS — M19011 Primary osteoarthritis, right shoulder: Secondary | ICD-10-CM | POA: Diagnosis not present

## 2016-03-10 DIAGNOSIS — M6281 Muscle weakness (generalized): Secondary | ICD-10-CM | POA: Diagnosis not present

## 2016-03-12 DIAGNOSIS — E119 Type 2 diabetes mellitus without complications: Secondary | ICD-10-CM | POA: Diagnosis not present

## 2016-03-12 DIAGNOSIS — Z0100 Encounter for examination of eyes and vision without abnormal findings: Secondary | ICD-10-CM | POA: Diagnosis not present

## 2016-03-12 LAB — HM DIABETES EYE EXAM

## 2016-03-13 DIAGNOSIS — M6281 Muscle weakness (generalized): Secondary | ICD-10-CM | POA: Diagnosis not present

## 2016-03-13 DIAGNOSIS — M25611 Stiffness of right shoulder, not elsewhere classified: Secondary | ICD-10-CM | POA: Diagnosis not present

## 2016-03-13 DIAGNOSIS — M25511 Pain in right shoulder: Secondary | ICD-10-CM | POA: Diagnosis not present

## 2016-03-13 DIAGNOSIS — M19011 Primary osteoarthritis, right shoulder: Secondary | ICD-10-CM | POA: Diagnosis not present

## 2016-03-14 ENCOUNTER — Encounter: Payer: Self-pay | Admitting: *Deleted

## 2016-03-17 DIAGNOSIS — M19011 Primary osteoarthritis, right shoulder: Secondary | ICD-10-CM | POA: Diagnosis not present

## 2016-03-17 DIAGNOSIS — M25611 Stiffness of right shoulder, not elsewhere classified: Secondary | ICD-10-CM | POA: Diagnosis not present

## 2016-03-17 DIAGNOSIS — M25511 Pain in right shoulder: Secondary | ICD-10-CM | POA: Diagnosis not present

## 2016-03-17 DIAGNOSIS — M6281 Muscle weakness (generalized): Secondary | ICD-10-CM | POA: Diagnosis not present

## 2016-03-19 DIAGNOSIS — K317 Polyp of stomach and duodenum: Secondary | ICD-10-CM | POA: Diagnosis not present

## 2016-03-19 DIAGNOSIS — D122 Benign neoplasm of ascending colon: Secondary | ICD-10-CM | POA: Diagnosis not present

## 2016-03-19 DIAGNOSIS — Z86010 Personal history of colonic polyps: Secondary | ICD-10-CM | POA: Diagnosis not present

## 2016-03-19 DIAGNOSIS — K2950 Unspecified chronic gastritis without bleeding: Secondary | ICD-10-CM | POA: Diagnosis not present

## 2016-03-19 DIAGNOSIS — K210 Gastro-esophageal reflux disease with esophagitis: Secondary | ICD-10-CM | POA: Diagnosis not present

## 2016-03-19 DIAGNOSIS — K5730 Diverticulosis of large intestine without perforation or abscess without bleeding: Secondary | ICD-10-CM | POA: Diagnosis not present

## 2016-03-19 DIAGNOSIS — K649 Unspecified hemorrhoids: Secondary | ICD-10-CM | POA: Diagnosis not present

## 2016-03-20 DIAGNOSIS — D126 Benign neoplasm of colon, unspecified: Secondary | ICD-10-CM | POA: Diagnosis not present

## 2016-03-20 DIAGNOSIS — K2950 Unspecified chronic gastritis without bleeding: Secondary | ICD-10-CM | POA: Diagnosis not present

## 2016-03-20 DIAGNOSIS — K317 Polyp of stomach and duodenum: Secondary | ICD-10-CM | POA: Diagnosis not present

## 2016-03-21 DIAGNOSIS — M25511 Pain in right shoulder: Secondary | ICD-10-CM | POA: Diagnosis not present

## 2016-03-21 DIAGNOSIS — M6281 Muscle weakness (generalized): Secondary | ICD-10-CM | POA: Diagnosis not present

## 2016-03-21 DIAGNOSIS — M25611 Stiffness of right shoulder, not elsewhere classified: Secondary | ICD-10-CM | POA: Diagnosis not present

## 2016-03-21 DIAGNOSIS — M19011 Primary osteoarthritis, right shoulder: Secondary | ICD-10-CM | POA: Diagnosis not present

## 2016-03-26 DIAGNOSIS — M6281 Muscle weakness (generalized): Secondary | ICD-10-CM | POA: Diagnosis not present

## 2016-03-26 DIAGNOSIS — M25511 Pain in right shoulder: Secondary | ICD-10-CM | POA: Diagnosis not present

## 2016-03-26 DIAGNOSIS — M25611 Stiffness of right shoulder, not elsewhere classified: Secondary | ICD-10-CM | POA: Diagnosis not present

## 2016-03-26 DIAGNOSIS — M19011 Primary osteoarthritis, right shoulder: Secondary | ICD-10-CM | POA: Diagnosis not present

## 2016-04-01 DIAGNOSIS — M6281 Muscle weakness (generalized): Secondary | ICD-10-CM | POA: Diagnosis not present

## 2016-04-01 DIAGNOSIS — M19011 Primary osteoarthritis, right shoulder: Secondary | ICD-10-CM | POA: Diagnosis not present

## 2016-04-01 DIAGNOSIS — M25511 Pain in right shoulder: Secondary | ICD-10-CM | POA: Diagnosis not present

## 2016-04-01 DIAGNOSIS — M25611 Stiffness of right shoulder, not elsewhere classified: Secondary | ICD-10-CM | POA: Diagnosis not present

## 2016-04-07 DIAGNOSIS — I480 Paroxysmal atrial fibrillation: Secondary | ICD-10-CM | POA: Diagnosis not present

## 2016-04-07 DIAGNOSIS — M25511 Pain in right shoulder: Secondary | ICD-10-CM | POA: Diagnosis not present

## 2016-04-07 DIAGNOSIS — M25611 Stiffness of right shoulder, not elsewhere classified: Secondary | ICD-10-CM | POA: Diagnosis not present

## 2016-04-07 DIAGNOSIS — I10 Essential (primary) hypertension: Secondary | ICD-10-CM | POA: Diagnosis not present

## 2016-04-07 DIAGNOSIS — M6281 Muscle weakness (generalized): Secondary | ICD-10-CM | POA: Diagnosis not present

## 2016-04-07 DIAGNOSIS — I4891 Unspecified atrial fibrillation: Secondary | ICD-10-CM | POA: Diagnosis not present

## 2016-04-07 DIAGNOSIS — E039 Hypothyroidism, unspecified: Secondary | ICD-10-CM | POA: Diagnosis not present

## 2016-04-07 DIAGNOSIS — M19011 Primary osteoarthritis, right shoulder: Secondary | ICD-10-CM | POA: Diagnosis not present

## 2016-04-08 LAB — COMPREHENSIVE METABOLIC PANEL
ALT: 18 U/L (ref 6–29)
AST: 16 U/L (ref 10–35)
Albumin: 3.8 g/dL (ref 3.6–5.1)
Alkaline Phosphatase: 65 U/L (ref 33–130)
BUN: 32 mg/dL — ABNORMAL HIGH (ref 7–25)
CO2: 27 mmol/L (ref 20–31)
Calcium: 8.9 mg/dL (ref 8.6–10.4)
Chloride: 99 mmol/L (ref 98–110)
Creat: 1.27 mg/dL — ABNORMAL HIGH (ref 0.60–0.93)
Glucose, Bld: 98 mg/dL (ref 65–99)
Potassium: 4.3 mmol/L (ref 3.5–5.3)
Sodium: 137 mmol/L (ref 135–146)
Total Bilirubin: 0.3 mg/dL (ref 0.2–1.2)
Total Protein: 6.9 g/dL (ref 6.1–8.1)

## 2016-04-08 LAB — TSH: TSH: 4.09 mIU/L

## 2016-04-08 LAB — CBC
HCT: 32.1 % — ABNORMAL LOW (ref 35.0–45.0)
Hemoglobin: 10 g/dL — ABNORMAL LOW (ref 11.7–15.5)
MCH: 26.1 pg — ABNORMAL LOW (ref 27.0–33.0)
MCHC: 31.2 g/dL — ABNORMAL LOW (ref 32.0–36.0)
MCV: 83.8 fL (ref 80.0–100.0)
MPV: 10.7 fL (ref 7.5–12.5)
Platelets: 298 10*3/uL (ref 140–400)
RBC: 3.83 MIL/uL (ref 3.80–5.10)
RDW: 15.6 % — ABNORMAL HIGH (ref 11.0–15.0)
WBC: 6.2 10*3/uL (ref 3.8–10.8)

## 2016-04-09 ENCOUNTER — Ambulatory Visit (INDEPENDENT_AMBULATORY_CARE_PROVIDER_SITE_OTHER): Payer: Medicare Other | Admitting: Cardiology

## 2016-04-09 ENCOUNTER — Encounter: Payer: Self-pay | Admitting: Cardiology

## 2016-04-09 VITALS — BP 148/58 | HR 88 | Ht 63.0 in | Wt 213.0 lb

## 2016-04-09 DIAGNOSIS — I350 Nonrheumatic aortic (valve) stenosis: Secondary | ICD-10-CM | POA: Diagnosis not present

## 2016-04-09 DIAGNOSIS — I10 Essential (primary) hypertension: Secondary | ICD-10-CM

## 2016-04-09 DIAGNOSIS — E119 Type 2 diabetes mellitus without complications: Secondary | ICD-10-CM

## 2016-04-09 DIAGNOSIS — I480 Paroxysmal atrial fibrillation: Secondary | ICD-10-CM

## 2016-04-09 NOTE — Patient Instructions (Signed)
Continue your current therapy  I will see you in 4 months  We will schedule you for an Echocardiogram

## 2016-04-09 NOTE — Progress Notes (Signed)
Cardiology Office Note   Date:  04/09/2016   ID:  Patricia [name], DOB [birth year], MRN [id number]  PCP:  Estill Dooms, MD  Cardiologist:   Bodin Gorka Martinique, MD   Chief Complaint  Patient presents with  . 2 MONTH  . Shortness of Breath  . Edema      History of Present Illness: Patricia [name] is a 80 y.o. female who presents for follow up atrial fibrillation and  aortic stenosis. She has a history of HTN, obesity, DM, HL, and anemia. She was admitted to the hospital in December 2016 with increased dyspnea. She was found to be in atrial fibrillation with RVR. She had pulmonary edema. Hgb was 10. Echo showed EF 55-60% with moderate to severe AS. She was anticoagulated with Xarelto and started on amiodarone. She had TEE guided DCCV with return to NSR. She was seen back in the office on 11/20/15 and was still in NSR. On 2/21.17 she underwent right shoulder arthroplasty. Post op Hgb was 9.6. It subsequently dropped to 7.1. She was transfused. She has been on iron. Hgb improved to 10. She did have EGD and colonoscopy with both gastric and colonic polyps. No bleeding seen.   On follow up today she is doing well.  She denies any palpitations. She does note SOB when she gets upset.  This is relieved with rest. No edema.  No dizziness.     Past Medical History  Diagnosis Date  . Allergy   . Arthritis   . Asthma   . Diabetes mellitus without complication ([place])   . Severe aortic stenosis   . Diarrhea   . Urge incontinence   . Lumbago   . Edema   . Cervicalgia   . Chronic airway obstruction, not elsewhere classified   . Pain in joint, lower leg   . Routine gynecological examination   . Scoliosis (and kyphoscoliosis), idiopathic   . Encounter for long-term (current) use of other medications   . Unspecified essential hypertension   . Other and unspecified hyperlipidemia   . Benign neoplasm of colon   . Carpal tunnel syndrome   . Unspecified hemorrhoids without mention of complication     . Diffuse cystic mastopathy   . Unspecified hypothyroidism   . Obesity, unspecified   . Cellulitis and abscess of finger, unspecified   . Other psoriasis   . Type II or unspecified type diabetes mellitus without mention of complication, uncontrolled   . Cerumen impaction   . Neck pain 05/23/2015  . Anemia, unspecified   . Atrial fibrillation status post cardioversion [facility]) 09/2015    s/p TEE/DCCV>>SR on amio  . Chronic anticoagulation 09/2015    Xarelto for afib, CHADS2VASC=5  . PONV (postoperative nausea and vomiting)   . Dysrhythmia   . Heart murmur   . Shortness of breath dyspnea     with exertion  . Primary localized osteoarthrosis of right shoulder 12/18/2015    Past Surgical History  Procedure Laterality Date  . Breast surgery      right breast 3 surgeries [phone]  . Cholecystectomy  1992    DR BOWMAN  . Colon surgery      2004,2007,2013.  3 times colon surgieres  . Abdominal hysterectomy  1987    complete  . Vaginal cyst removed  1967  . 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 Foot surgery  1989  . Excise le mandibular lymph node t      DR C. NEWMAN  . Left  shoulder arthroscopy    . Neuroplasty / transposition median nerve at carpal tunnel bilateral  05/2003  . Tee without cardioversion N/A 10/19/2015    Procedure: Transesophageal Echocardiogram (TEE) ;  Surgeon: Lelon Perla, MD;  Location: [facility] ENDOSCOPY;  Service: Cardiovascular;  Laterality: N/A;  . Cardioversion N/A 10/19/2015    Procedure: CARDIOVERSION;  Surgeon: Lelon Perla, MD;  Location: [facility] ENDOSCOPY;  Service: Cardiovascular;  Laterality: N/A;  . Trigger finger release Left   . Total shoulder arthroplasty Right 12/18/2015    Procedure: TOTAL SHOULDER ARTHROPLASTY;  Surgeon: [name], MD;  Location: [place];  Service: Orthopedics;  Laterality: Right;     Current Outpatient Prescriptions  Medication Sig Dispense Refill  . ADVAIR DISKUS 100-50 MCG/DOSE AEPB Inhale 1 puff two times  daily for  breathing 60 each 2  . albuterol (PROVENTIL HFA;VENTOLIN HFA) 108 (90 BASE) MCG/ACT inhaler Inhale 2 puffs into the lungs every 6 (six) hours as needed for wheezing or shortness of breath. 1 Inhaler 0  . amiodarone (PACERONE) 200 MG tablet Take one tablet daily by mouth 90 tablet 3  . cetirizine (ZYRTEC) 10 MG tablet Take 10 mg by mouth daily as needed for allergies.     . cholecalciferol (VITAMIN D) 1000 units tablet One daily for vitamin D supplement 100 tablet 4  . COMFORT LANCETS MISC Use daily to get blood for glucometer 100 each 3  . diltiazem (CARDIZEM CD) 180 MG 24 hr capsule Take 1 capsule by mouth  once a day to control blood pressure 90 capsule 3  . doxazosin (CARDURA) 4 MG tablet Take one tablet by mouth once daily to help control blood pressure 90 tablet 3  . ferrous sulfate 325 (65 FE) MG EC tablet Take 1 tablet (325 mg total) by mouth daily with breakfast. 30 tablet 3  . furosemide (LASIX) 40 MG tablet Take 40 mg by mouth daily.    Marland Kitchen glimepiride (AMARYL) 2 MG tablet Take 1 tablet by mouth at  breakfast to control  Glucose 90 tablet 3  . glucose blood (BAYER CONTOUR TEST) test strip Use daily too check glucose 100 each 4  . levothyroxine (SYNTHROID, LEVOTHROID) 50 MCG tablet Take 1 tablet by mouth  daily for thyroid  supplement 90 tablet 3  . metFORMIN (GLUCOPHAGE) 500 MG tablet TAKE ONE TABLET BEFORE  BREAKFAST AND ONE TABLET  BEFORE SUPPER TO CONTROL  BLOOD SUGARS 180 tablet 3  . oxybutynin (DITROPAN) 5 MG tablet Take 5 mg by mouth 2 (two) times daily. To help control bladder    . pantoprazole (PROTONIX) 40 MG tablet Take 40 mg by mouth.    . Potassium 95 MG TABS Take 1 tablet by mouth 3 (three) times a week. Pt only takes when taking LASIX     No current facility-administered medications for this visit.    Allergies:   Codeine and Vesicare    Social History:  The patient  reports that she quit smoking about 26 years ago. She has never used smokeless tobacco. She reports that  she does not drink alcohol or use illicit drugs.   Family History:  The patient's family history includes Arthritis in her sister; Asthma in her sister; Diabetes in her father; Heart disease in her brother and father; Liver cancer in her sister; Stroke in her father.    ROS:  Please see the history of present illness.   Otherwise, review of systems are positive for none.   All other systems are reviewed and  negative.    PHYSICAL EXAM: VS:  BP 148/58 mmHg  Pulse 88  Ht _0  (1.6 m)  Wt 213 lb (96.616 kg)  BMI 37.74 kg/m2 , BMI Body mass index is 37.74 kg/(m^2). GEN: Well nourished, well developed, in no acute distress HEENT: normal Neck: no JVD, carotid bruits, or masses Cardiac: RRR; harsh gr 3/6 systolic murmur RUSB. No gallop  Respiratory:  clear to auscultation bilaterally, normal work of breathing GI: soft, nontender, nondistended, + BS MS: no deformity or atrophy Skin: warm and dry, no rash. Trace LE edema.  Neuro:  Strength and sensation are intact Psych: euthymic mood, full affect   EKG:  EKG is not ordered today.     Recent Labs: Lab Results  Component Value Date   WBC 6.2 04/07/2016   HGB 10.0* 04/07/2016   HCT 32.1* 04/07/2016   PLT 298 04/07/2016   GLUCOSE 98 04/07/2016   CHOL 165 09/24/2015   TRIG 119 09/24/2015   HDL 53 09/24/2015   LDLCALC 88 09/24/2015   ALT 18 04/07/2016   AST 16 04/07/2016   NA 137 04/07/2016   K 4.3 04/07/2016   CL 99 04/07/2016   CREATININE 1.27* 04/07/2016   BUN 32* 04/07/2016   CO2 27 04/07/2016   TSH 4.09 04/07/2016   INR 1.14 10/19/2015   HGBA1C 7.2* 12/07/2015      Lipid Panel    Component Value Date/Time   CHOL 165 09/24/2015 0916   TRIG 119 09/24/2015 0916   HDL 53 09/24/2015 0916   CHOLHDL 3.1 09/24/2015 0916   LDLCALC 88 09/24/2015 0916      Wt Readings from Last 3 Encounters:  04/09/16 213 lb (96.616 kg)  03/05/16 217 lb (98.431 kg)  02/22/16 214 lb (97.07 kg)      Other studies  Reviewed: Additional studies/ records that were reviewed today include: none Review of the above records demonstrates: N/A   ASSESSMENT AND PLAN:  1.  Atrial fibrillation with RVR. S/p DCCV in December. Maintaining NSR on amiodarone. Recent TSH is normal.  Continue current amiodarone dose. We discussed anticoagulation. I am concerned about her risk of bleeding with prior heme + stool and significant anemia. No clear cut source on GI evaluation but polyps noted. I would favor holding off on anticoagulation for now. Will follow Hgb. If she should have recurrent Afib we would need to reconsider.   2. Moderate to Severe Aortic stenosis. Discussed that she may need to be considered for AV surgery in the near future. Will Repeat Echo now to assess progression. Last Echo done when more anemic which may have influenced gradient.   3. Anemia Fe deficient. Hgb 10. Will hold Xarelto for now.. Monitor Hgb.   4. Acute on chronic renal failure. Probably exacerbated by blood loss and volume contraction.  5. S/p right shoulder arthroplasty. Per ortho.   6. Chronic diastolic CHF. Not currently volume overloaded.  7. HTN  8. DM type 2.     Current medicines are reviewed at length with the patient today.  The patient does not have concerns regarding medicines.  The following changes have been made:  See above  Labs/ tests ordered today include:   Orders Placed This Encounter  Procedures  . ECHOCARDIOGRAM COMPLETE     Disposition:   FU with me in 4  months.    Signed, Titus Drone Martinique, MD  04/09/2016 6:06 PM    [facility] [address] [place], [place], [address] Phone [phone], Fax  [phone]

## 2016-04-10 DIAGNOSIS — M25611 Stiffness of right shoulder, not elsewhere classified: Secondary | ICD-10-CM | POA: Diagnosis not present

## 2016-04-10 DIAGNOSIS — M19011 Primary osteoarthritis, right shoulder: Secondary | ICD-10-CM | POA: Diagnosis not present

## 2016-04-10 DIAGNOSIS — M25511 Pain in right shoulder: Secondary | ICD-10-CM | POA: Diagnosis not present

## 2016-04-10 DIAGNOSIS — M6281 Muscle weakness (generalized): Secondary | ICD-10-CM | POA: Diagnosis not present

## 2016-04-14 DIAGNOSIS — M25611 Stiffness of right shoulder, not elsewhere classified: Secondary | ICD-10-CM | POA: Diagnosis not present

## 2016-04-14 DIAGNOSIS — M19011 Primary osteoarthritis, right shoulder: Secondary | ICD-10-CM | POA: Diagnosis not present

## 2016-04-14 DIAGNOSIS — M25511 Pain in right shoulder: Secondary | ICD-10-CM | POA: Diagnosis not present

## 2016-04-14 DIAGNOSIS — M6281 Muscle weakness (generalized): Secondary | ICD-10-CM | POA: Diagnosis not present

## 2016-04-17 ENCOUNTER — Ambulatory Visit: Payer: Medicare Other | Admitting: Gastroenterology

## 2016-04-17 DIAGNOSIS — M25511 Pain in right shoulder: Secondary | ICD-10-CM | POA: Diagnosis not present

## 2016-04-17 DIAGNOSIS — M6281 Muscle weakness (generalized): Secondary | ICD-10-CM | POA: Diagnosis not present

## 2016-04-17 DIAGNOSIS — M25611 Stiffness of right shoulder, not elsewhere classified: Secondary | ICD-10-CM | POA: Diagnosis not present

## 2016-04-17 DIAGNOSIS — M19011 Primary osteoarthritis, right shoulder: Secondary | ICD-10-CM | POA: Diagnosis not present

## 2016-04-21 DIAGNOSIS — M19011 Primary osteoarthritis, right shoulder: Secondary | ICD-10-CM | POA: Diagnosis not present

## 2016-04-23 DIAGNOSIS — M25511 Pain in right shoulder: Secondary | ICD-10-CM | POA: Diagnosis not present

## 2016-04-23 DIAGNOSIS — M19011 Primary osteoarthritis, right shoulder: Secondary | ICD-10-CM | POA: Diagnosis not present

## 2016-04-23 DIAGNOSIS — M25611 Stiffness of right shoulder, not elsewhere classified: Secondary | ICD-10-CM | POA: Diagnosis not present

## 2016-04-23 DIAGNOSIS — M6281 Muscle weakness (generalized): Secondary | ICD-10-CM | POA: Diagnosis not present

## 2016-04-24 ENCOUNTER — Other Ambulatory Visit: Payer: Medicare Other

## 2016-04-28 ENCOUNTER — Other Ambulatory Visit: Payer: Medicare Other

## 2016-04-28 DIAGNOSIS — E785 Hyperlipidemia, unspecified: Secondary | ICD-10-CM | POA: Diagnosis not present

## 2016-04-28 DIAGNOSIS — I10 Essential (primary) hypertension: Secondary | ICD-10-CM | POA: Diagnosis not present

## 2016-04-28 DIAGNOSIS — D649 Anemia, unspecified: Secondary | ICD-10-CM | POA: Diagnosis not present

## 2016-04-28 DIAGNOSIS — E039 Hypothyroidism, unspecified: Secondary | ICD-10-CM | POA: Diagnosis not present

## 2016-04-28 DIAGNOSIS — E119 Type 2 diabetes mellitus without complications: Secondary | ICD-10-CM

## 2016-04-29 LAB — CBC WITH DIFFERENTIAL/PLATELET
Basophils Absolute: 0 10*3/uL (ref 0.0–0.2)
Basos: 1 %
EOS (ABSOLUTE): 0.2 10*3/uL (ref 0.0–0.4)
Eos: 3 %
Hematocrit: 33.1 % — ABNORMAL LOW (ref 34.0–46.6)
Hemoglobin: 10.5 g/dL — ABNORMAL LOW (ref 11.1–15.9)
Immature Grans (Abs): 0 10*3/uL (ref 0.0–0.1)
Immature Granulocytes: 0 %
Lymphocytes Absolute: 2 10*3/uL (ref 0.7–3.1)
Lymphs: 30 %
MCH: 26.3 pg — ABNORMAL LOW (ref 26.6–33.0)
MCHC: 31.7 g/dL (ref 31.5–35.7)
MCV: 83 fL (ref 79–97)
Monocytes Absolute: 0.6 10*3/uL (ref 0.1–0.9)
Monocytes: 9 %
Neutrophils Absolute: 3.8 10*3/uL (ref 1.4–7.0)
Neutrophils: 57 %
Platelets: 278 10*3/uL (ref 150–379)
RBC: 4 x10E6/uL (ref 3.77–5.28)
RDW: 16 % — ABNORMAL HIGH (ref 12.3–15.4)
WBC: 6.5 10*3/uL (ref 3.4–10.8)

## 2016-04-29 LAB — LIPID PANEL
Chol/HDL Ratio: 3 ratio units (ref 0.0–4.4)
Cholesterol, Total: 182 mg/dL (ref 100–199)
HDL: 61 mg/dL (ref >39)
LDL Calculated: 86 mg/dL (ref 0–99)
Triglycerides: 174 mg/dL — ABNORMAL HIGH (ref 0–149)
VLDL Cholesterol Cal: 35 mg/dL (ref 5–40)

## 2016-04-29 LAB — COMPREHENSIVE METABOLIC PANEL
ALT: 20 IU/L (ref 0–32)
AST: 19 IU/L (ref 0–40)
Albumin/Globulin Ratio: 1.3 (ref 1.2–2.2)
Albumin: 4.2 g/dL (ref 3.5–4.8)
Alkaline Phosphatase: 70 IU/L (ref 39–117)
BUN/Creatinine Ratio: 21 (ref 12–28)
BUN: 27 mg/dL (ref 8–27)
Bilirubin Total: 0.2 mg/dL (ref 0.0–1.2)
CO2: 27 mmol/L (ref 18–29)
Calcium: 8.8 mg/dL (ref 8.7–10.3)
Chloride: 95 mmol/L — ABNORMAL LOW (ref 96–106)
Creatinine, Ser: 1.27 mg/dL — ABNORMAL HIGH (ref 0.57–1.00)
GFR calc Af Amer: 47 mL/min/{1.73_m2} — ABNORMAL LOW (ref >59)
GFR calc non Af Amer: 41 mL/min/{1.73_m2} — ABNORMAL LOW (ref >59)
Globulin, Total: 3.3 g/dL (ref 1.5–4.5)
Glucose: 119 mg/dL — ABNORMAL HIGH (ref 65–99)
Potassium: 4.2 mmol/L (ref 3.5–5.2)
Sodium: 139 mmol/L (ref 134–144)
Total Protein: 7.5 g/dL (ref 6.0–8.5)

## 2016-04-29 LAB — HEMOGLOBIN A1C
Est. average glucose Bld gHb Est-mCnc: 148 mg/dL
Hgb A1c MFr Bld: 6.8 % — ABNORMAL HIGH (ref 4.8–5.6)

## 2016-04-29 LAB — TSH: TSH: 6.03 u[IU]/mL — ABNORMAL HIGH (ref 0.450–4.500)

## 2016-04-29 LAB — MICROALBUMIN, URINE: Microalbumin, Urine: 38.3 ug/mL

## 2016-04-30 ENCOUNTER — Ambulatory Visit (HOSPITAL_COMMUNITY): Payer: Medicare Other | Attending: Cardiovascular Disease

## 2016-04-30 ENCOUNTER — Ambulatory Visit (INDEPENDENT_AMBULATORY_CARE_PROVIDER_SITE_OTHER): Payer: Medicare Other | Admitting: Internal Medicine

## 2016-04-30 ENCOUNTER — Other Ambulatory Visit: Payer: Self-pay

## 2016-04-30 ENCOUNTER — Encounter: Payer: Self-pay | Admitting: Internal Medicine

## 2016-04-30 VITALS — BP 180/80 | HR 66 | Temp 97.9°F | Ht 63.0 in | Wt 220.0 lb

## 2016-04-30 DIAGNOSIS — I119 Hypertensive heart disease without heart failure: Secondary | ICD-10-CM | POA: Insufficient documentation

## 2016-04-30 DIAGNOSIS — M25562 Pain in left knee: Secondary | ICD-10-CM

## 2016-04-30 DIAGNOSIS — I350 Nonrheumatic aortic (valve) stenosis: Secondary | ICD-10-CM | POA: Diagnosis not present

## 2016-04-30 DIAGNOSIS — M25561 Pain in right knee: Secondary | ICD-10-CM | POA: Diagnosis not present

## 2016-04-30 DIAGNOSIS — E039 Hypothyroidism, unspecified: Secondary | ICD-10-CM

## 2016-04-30 DIAGNOSIS — I5032 Chronic diastolic (congestive) heart failure: Secondary | ICD-10-CM | POA: Diagnosis not present

## 2016-04-30 DIAGNOSIS — I340 Nonrheumatic mitral (valve) insufficiency: Secondary | ICD-10-CM | POA: Diagnosis not present

## 2016-04-30 DIAGNOSIS — I10 Essential (primary) hypertension: Secondary | ICD-10-CM | POA: Diagnosis not present

## 2016-04-30 DIAGNOSIS — E669 Obesity, unspecified: Secondary | ICD-10-CM

## 2016-04-30 DIAGNOSIS — D649 Anemia, unspecified: Secondary | ICD-10-CM | POA: Diagnosis not present

## 2016-04-30 DIAGNOSIS — E119 Type 2 diabetes mellitus without complications: Secondary | ICD-10-CM | POA: Diagnosis not present

## 2016-04-30 DIAGNOSIS — Z7901 Long term (current) use of anticoagulants: Secondary | ICD-10-CM

## 2016-04-30 DIAGNOSIS — I480 Paroxysmal atrial fibrillation: Secondary | ICD-10-CM | POA: Diagnosis not present

## 2016-04-30 DIAGNOSIS — E785 Hyperlipidemia, unspecified: Secondary | ICD-10-CM

## 2016-04-30 LAB — ECHOCARDIOGRAM COMPLETE
AO mean calculated velocity dopler: 287 cm/s
AV Area VTI index: 0.37 cm2/m2
AV Area VTI: 0.72 cm2
AV Area mean vel: 0.77 cm2
AV Mean grad: 37 mmHg
AV Peak grad: 61 mmHg
AV VEL mean LVOT/AV: 0.3
AV area mean vel ind: 0.39 cm2/m2
AV peak Index: 0.36
AV pk vel: 389 cm/s
AV vel: 0.73
Ao pk vel: 0.29 m/s
Area-P 1/2: 3.28 cm2
E decel time: 225 msec
E/e' ratio: 25.91
FS: 37 % (ref 28–44)
IVS/LV PW RATIO, ED: 1.07
LA ID, A-P, ES: 49 mm
LA diam end sys: 49 mm
LA diam index: 2.46 cm/m2
LA vol A4C: 75 ml
LA vol index: 48.2 mL/m2
LA vol: 96 mL
LV E/e' medial: 25.91
LV E/e'average: 25.91
LV PW d: 15.8 mm — AB (ref 0.6–1.1)
LV e' LATERAL: 5.48 cm/s
LVOT MV VTI INDEX: 0.94 cm2/m2
LVOT MV VTI: 1.88
LVOT SV: 77 mL
LVOT VTI: 30.3 cm
LVOT area: 2.54 cm2
LVOT diameter: 18 mm
LVOT peak VTI: 0.29 cm
LVOT peak vel: 111 cm/s
MV Annulus VTI: 40.9 cm
MV Dec: 225
MV M vel: 91.4
MV Peak grad: 8 mmHg
MV pk A vel: 117 m/s
MV pk E vel: 142 m/s
Mean grad: 4 mmHg
P 1/2 time: 147 ms
P 1/2 time: 49 ms
Reg peak vel: 302 cm/s
TDI e' lateral: 5.48
TDI e' medial: 7.35
TR max vel: 302 cm/s
VTI: 105 cm
Valve area index: 0.37
Valve area: 0.73 cm2

## 2016-04-30 NOTE — Patient Instructions (Signed)
Check BP daily.

## 2016-04-30 NOTE — Progress Notes (Signed)
Patient ID: Patricia [name], female   DOB: 1936-04-02, 80 y.o.   MRN: [id number]    Facility  [facility]    Place of Service:   OFFICE    Allergies  Allergen Reactions  . Codeine Other (See Comments)    "spaces her out, dizziness, can't walk well"  . Vesicare [Solifenacin] Other (See Comments)    unknown    Chief Complaint  Patient presents with  . Medical Management of Chronic Issues    3 month medication management blood sugar, blood pressure, thyroid, anemia, cholesterol.  . elevated blood pressure    today while having 2-D Echo it was 213/----, 195/79    HPI:   SBP high: Taking medications regularly as directed. Denies headache, chest pain, palpitations.  Aortic stenosis: had 2 D Ech this morning. Results pending:   Anemia; slightl improvement in Hgb. Remains on iron. Colonoscopy and EGD done by Acquanetta Sit, MD in [facility] 03/19/16 in Outpatient Area. Report is scanned in Media.  Right earache. History of infections several years ago. States that she got water in her ear from the shower and worries that this may agree to some problems. Denies loss of hearing in the right ear.  DM; controlled. A1c dropped to 6.8  Lipids under control  No significant weight loss  Last TSH high at 6. Patient says she continues to take levothyroxine as ordered.  Medications: Patient's Medications  New Prescriptions   No medications on file  Previous Medications   ADVAIR DISKUS 100-50 MCG/DOSE AEPB    Inhale 1 puff two times  daily for breathing   ALBUTEROL (PROVENTIL HFA;VENTOLIN HFA) 108 (90 BASE) MCG/ACT INHALER    Inhale 2 puffs into the lungs every 6 (six) hours as needed for wheezing or shortness of breath.   AMIODARONE (PACERONE) 200 MG TABLET    Take one tablet daily by mouth   CETIRIZINE (ZYRTEC) 10 MG TABLET    Take 10 mg by mouth daily as needed for allergies.    CHOLECALCIFEROL (VITAMIN D) 1000 UNITS TABLET    One daily for vitamin D supplement   COMFORT LANCETS MISC    Use  daily to get blood for glucometer   DILTIAZEM (CARDIZEM CD) 180 MG 24 HR CAPSULE    Take 1 capsule by mouth  once a day to control blood pressure   DOXAZOSIN (CARDURA) 4 MG TABLET    Take one tablet by mouth once daily to help control blood pressure   FERROUS SULFATE 325 (65 FE) MG EC TABLET    Take 1 tablet (325 mg total) by mouth daily with breakfast.   FUROSEMIDE (LASIX) 40 MG TABLET    Take 40 mg by mouth daily.   GLIMEPIRIDE (AMARYL) 2 MG TABLET    Take 1 tablet by mouth at  breakfast to control  Glucose   GLUCOSE BLOOD (BAYER CONTOUR TEST) TEST STRIP    Use daily too check glucose   LEVOTHYROXINE (SYNTHROID, LEVOTHROID) 50 MCG TABLET    Take 1 tablet by mouth  daily for thyroid  supplement   METFORMIN (GLUCOPHAGE) 500 MG TABLET    TAKE ONE TABLET BEFORE  BREAKFAST AND ONE TABLET  BEFORE SUPPER TO CONTROL  BLOOD SUGARS   OXYBUTYNIN (DITROPAN) 5 MG TABLET    Take 5 mg by mouth 2 (two) times daily. To help control bladder   PANTOPRAZOLE (PROTONIX) 40 MG TABLET    Take 40 mg by mouth.   POTASSIUM 95 MG TABS    Take  1 tablet by mouth 3 (three) times a week. Pt only takes when taking LASIX  Modified Medications   No medications on file  Discontinued Medications   No medications on file    Review of Systems  Constitutional: Negative for fever, chills, diaphoresis, activity change, appetite change, fatigue and unexpected weight change.       Gaining weight  HENT: Positive for postnasal drip. Negative for congestion, ear discharge, ear pain, hearing loss, rhinorrhea, sore throat, tinnitus, trouble swallowing and voice change.   Eyes: Negative for pain, redness, itching and visual disturbance.  Respiratory: Negative for cough, choking, shortness of breath and wheezing.   Cardiovascular: Negative for chest pain, palpitations and leg swelling.       Aortic valve ejection murmur  Gastrointestinal: Negative for nausea, abdominal pain, diarrhea, constipation and abdominal distention.  Endocrine:  Negative for cold intolerance, heat intolerance, polydipsia, polyphagia and polyuria.       Diabetic. Hypothyroid.  Genitourinary: Negative for dysuria, urgency, frequency, hematuria, flank pain, vaginal discharge, difficulty urinating and pelvic pain.  Musculoskeletal: Positive for back pain and arthralgias. Negative for myalgias, gait problem, neck pain and neck stiffness.       Post right shoulder arthroplasty in Feb. 2017. Has seen Dr. Eddie Dibbles about pain in the right knee. Got injection but it did not help. He talked about using a gel injection. Knee gives out sometimes. Using a cane at times. Recurrent problems with mild discomfort.  Skin: Negative for color change, pallor and rash.  Allergic/Immunologic: Negative.   Neurological: Negative for dizziness, tremors, seizures, syncope, weakness, numbness and headaches.  Hematological: Negative for adenopathy. Does not bruise/bleed easily.       Anemic on lab in October 2016  Psychiatric/Behavioral: Negative.  Negative for suicidal ideas, hallucinations, behavioral problems, confusion, sleep disturbance, dysphoric mood and agitation. The patient is not nervous/anxious and is not hyperactive.     Filed Vitals:   04/30/16 1121  BP: 212/84  Pulse: 65  Temp: 97.9 F (36.6 C)  TempSrc: Oral  Height: _0  (1.6 m)  Weight: 220 lb (99.791 kg)  SpO2: 96%   Body mass index is 38.98 kg/(m^2). Filed Weights   04/30/16 1121  Weight: 220 lb (99.791 kg)     Physical Exam  Constitutional: She is oriented to person, place, and time. No distress.  Obese  HENT:  Head: Normocephalic and atraumatic.  Right Ear: External ear normal.  Left Ear: External ear normal.  Nose: Nose normal.  Mouth/Throat: Oropharynx is clear and moist.  Right EAC partially occluded with cerumen. Following removal right TM retraction noted.  Eyes: Conjunctivae and EOM are normal.  Neck: Normal range of motion. Neck supple. No JVD present. No tracheal deviation present.  No thyromegaly present.  Cardiovascular: Normal rate, regular rhythm and intact distal pulses.  Exam reveals no gallop and no friction rub.   Murmur (2/6 SEM at the aortic area and LSB) heard. 3/6 aortic ejection murmur  Pulmonary/Chest: No respiratory distress. She has no wheezes. She exhibits no tenderness.  Abdominal: Bowel sounds are normal. She exhibits no distension and no mass. There is no tenderness.  Musculoskeletal: Normal range of motion. She exhibits edema. She exhibits no tenderness.  Pain in the both shoulders (R>L) with rotational movement. Pain in both knees (R>L).  Lymphadenopathy:    She has no cervical adenopathy.  Neurological: She is alert and oriented to person, place, and time. She has normal reflexes. No cranial nerve deficit. Coordination normal.  Skin: No rash  noted. No erythema. No pallor.  Psychiatric: She has a normal mood and affect. Judgment and thought content normal.    Labs reviewed: Lab Summary Latest Ref Rng 04/28/2016 04/07/2016  Hemoglobin 11.1 - 15.9 g/dL 10.5(L) 10.0(L)  Hematocrit 34.0 - 46.6 % 33.1(L) 32.1(L)  White count 3.4 - 10.8 x10E3/uL 6.5 6.2  Platelet count 150 - 379 x10E3/uL 278 298  Sodium 134 - 144 mmol/L 139 137  Potassium 3.5 - 5.2 mmol/L 4.2 4.3  Calcium 8.7 - 10.3 mg/dL 8.8 8.9  Phosphorus - (None) (None)  Creatinine 0.57 - 1.00 mg/dL 1.27(H) 1.27(H)  AST 0 - 40 IU/L 19 16  Alk Phos 39 - 117 IU/L 70 65  Bilirubin 0.0 - 1.2 mg/dL 0.2 0.3  Glucose 65 - 99 mg/dL 119(H) 98  Cholesterol - (None) (None)  HDL cholesterol >39 mg/dL 61 (None)  Triglycerides 0 - 149 mg/dL 174(H) (None)  LDL Direct - (None) (None)  LDL Calc 0 - 99 mg/dL 86 (None)  Total protein 6.1 - 8.1 g/dL (None) 6.9  Albumin 3.5 - 4.8 g/dL 4.2 3.8   Lab Results  Component Value Date   TSH 6.030* 04/28/2016   TSH 4.09 04/07/2016   TSH 5.73* 02/05/2016   Lab Results  Component Value Date   BUN 27 04/28/2016   BUN 32* 04/07/2016   BUN 30* 02/29/2016   Lab  Results  Component Value Date   HGBA1C 6.8* 04/28/2016   HGBA1C 7.2* 12/07/2015   HGBA1C 7.0* 09/24/2015    Assessment/Plan  1. Essential hypertension According the patient, her home blood pressures are ranging about 140/80. Today's blood pressure seemed to be out of line with that. I have asked her to check on her blood pressure daily at home and returned about 2 weeks for follow-up visit. If blood pressures remain high overall, I would anticipate putting her on another antihypertensive like losartan.  2. Type 2 diabetes mellitus without complication, without long-term current use of insulin (HCC) Continue current medications  3. Hypothyroidism, unspecified hypothyroidism type Continue current medication and follow-up in the future.  4. Hyperlipidemia Controlled  5. Anemia, unspecified anemia type Continue monitor periodically  6. Obesity Patient's knee pain and shoulder pains inhibitor activity. She has not been successful in losing weight.  7. Paroxysmal atrial fibrillation (HCC) Currently in normal sinus rhythm  8. Chronic anticoagulation Ideally, patient will be anticoagulated, but her anemia problems have caused [place] not to anticoagulate at this time.  9. Chronic diastolic CHF (congestive heart failure) ([place]) Compensated  10. Knee pain, bilateral Continue see Dr. Mardelle Matte

## 2016-05-08 ENCOUNTER — Other Ambulatory Visit: Payer: Self-pay | Admitting: Internal Medicine

## 2016-05-08 DIAGNOSIS — Z1231 Encounter for screening mammogram for malignant neoplasm of breast: Secondary | ICD-10-CM

## 2016-05-13 ENCOUNTER — Encounter: Payer: Self-pay | Admitting: Internal Medicine

## 2016-05-13 ENCOUNTER — Ambulatory Visit (INDEPENDENT_AMBULATORY_CARE_PROVIDER_SITE_OTHER): Payer: Medicare Other | Admitting: Internal Medicine

## 2016-05-13 VITALS — BP 158/68 | HR 63 | Temp 97.7°F | Ht 63.0 in | Wt 218.0 lb

## 2016-05-13 DIAGNOSIS — I10 Essential (primary) hypertension: Secondary | ICD-10-CM

## 2016-05-13 DIAGNOSIS — E785 Hyperlipidemia, unspecified: Secondary | ICD-10-CM

## 2016-05-13 DIAGNOSIS — E119 Type 2 diabetes mellitus without complications: Secondary | ICD-10-CM | POA: Diagnosis not present

## 2016-05-13 DIAGNOSIS — E039 Hypothyroidism, unspecified: Secondary | ICD-10-CM | POA: Diagnosis not present

## 2016-05-13 DIAGNOSIS — E669 Obesity, unspecified: Secondary | ICD-10-CM | POA: Diagnosis not present

## 2016-05-13 DIAGNOSIS — I350 Nonrheumatic aortic (valve) stenosis: Secondary | ICD-10-CM | POA: Diagnosis not present

## 2016-05-13 DIAGNOSIS — D649 Anemia, unspecified: Secondary | ICD-10-CM | POA: Diagnosis not present

## 2016-05-13 NOTE — Progress Notes (Signed)
Patient ID: Patricia [name], female   DOB: 05-Sep-1936, 80 y.o.   MRN: [id number]    Facility  [facility]    Place of Service:   OFFICE    Allergies  Allergen Reactions  . Codeine Other (See Comments)    "spaces her out, dizziness, can't walk well"  . Vesicare [Solifenacin] Other (See Comments)    unknown    No chief complaint on file.   HPI:  Last seen 04/30/2016. Blood pressure 212/84 was out of line with reported blood pressures and previous blood pressures. She returns today for follow-up.  Discussed her diet and weight loss  Medications: Patient's Medications  New Prescriptions   No medications on file  Previous Medications   ADVAIR DISKUS 100-50 MCG/DOSE AEPB    Inhale 1 puff two times  daily for breathing   ALBUTEROL (PROVENTIL HFA;VENTOLIN HFA) 108 (90 BASE) MCG/ACT INHALER    Inhale 2 puffs into the lungs every 6 (six) hours as needed for wheezing or shortness of breath.   AMIODARONE (PACERONE) 200 MG TABLET    Take one tablet daily by mouth   CETIRIZINE (ZYRTEC) 10 MG TABLET    Take 10 mg by mouth daily as needed for allergies.    CHOLECALCIFEROL (VITAMIN D) 1000 UNITS TABLET    One daily for vitamin D supplement   COMFORT LANCETS MISC    Use daily to get blood for glucometer   DILTIAZEM (CARDIZEM CD) 180 MG 24 HR CAPSULE    Take 1 capsule by mouth  once a day to control blood pressure   DOXAZOSIN (CARDURA) 4 MG TABLET    Take one tablet by mouth once daily to help control blood pressure   FERROUS SULFATE 325 (65 FE) MG EC TABLET    Take 1 tablet (325 mg total) by mouth daily with breakfast.   FUROSEMIDE (LASIX) 40 MG TABLET    Take 40 mg by mouth daily.   GLIMEPIRIDE (AMARYL) 2 MG TABLET    Take 1 tablet by mouth at  breakfast to control  Glucose   GLUCOSE BLOOD (BAYER CONTOUR TEST) TEST STRIP    Use daily too check glucose   LEVOTHYROXINE (SYNTHROID, LEVOTHROID) 50 MCG TABLET    Take 1 tablet by mouth  daily for thyroid  supplement   METFORMIN (GLUCOPHAGE) 500 MG TABLET     TAKE ONE TABLET BEFORE  BREAKFAST AND ONE TABLET  BEFORE SUPPER TO CONTROL  BLOOD SUGARS   OXYBUTYNIN (DITROPAN) 5 MG TABLET    Take 5 mg by mouth 2 (two) times daily. To help control bladder   PANTOPRAZOLE (PROTONIX) 40 MG TABLET    Take 40 mg by mouth.   POTASSIUM 95 MG TABS    Take 1 tablet by mouth 3 (three) times a week. Pt only takes when taking LASIX  Modified Medications   No medications on file  Discontinued Medications   No medications on file    Review of Systems  Constitutional: Negative for fever, chills, diaphoresis, activity change, appetite change, fatigue and unexpected weight change.       Gaining weight  HENT: Positive for postnasal drip. Negative for congestion, ear discharge, ear pain, hearing loss, rhinorrhea, sore throat, tinnitus, trouble swallowing and voice change.   Eyes: Negative for pain, redness, itching and visual disturbance.  Respiratory: Negative for cough, choking, shortness of breath and wheezing.   Cardiovascular: Negative for chest pain, palpitations and leg swelling.       Aortic valve ejection murmur  Gastrointestinal:  Negative for nausea, abdominal pain, diarrhea, constipation and abdominal distention.  Endocrine: Negative for cold intolerance, heat intolerance, polydipsia, polyphagia and polyuria.       Diabetic. Hypothyroid.  Genitourinary: Negative for dysuria, urgency, frequency, hematuria, flank pain, vaginal discharge, difficulty urinating and pelvic pain.  Musculoskeletal: Positive for back pain and arthralgias. Negative for myalgias, gait problem, neck pain and neck stiffness.       Post right shoulder arthroplasty in Feb. 2017. Has seen Dr. Eddie Dibbles about pain in the right knee. Got injection but it did not help. He talked about using a gel injection. Knee gives out sometimes. Using a cane at times. Recurrent problems with mild discomfort.  Skin: Negative for color change, pallor and rash.  Allergic/Immunologic: Negative.   Neurological:  Negative for dizziness, tremors, seizures, syncope, weakness, numbness and headaches.  Hematological: Negative for adenopathy. Does not bruise/bleed easily.       Anemic on lab in October 2016  Psychiatric/Behavioral: Negative.  Negative for suicidal ideas, hallucinations, behavioral problems, confusion, sleep disturbance, dysphoric mood and agitation. The patient is not nervous/anxious and is not hyperactive.     Filed Vitals:   05/13/16 1443  BP: 158/68  Pulse: 63  Temp: 97.7 F (36.5 C)  TempSrc: Oral  Height: _0  (1.6 m)  Weight: 218 lb (98.884 kg)  SpO2: 94%   Body mass index is 38.63 kg/(m^2). Wt Readings from Last 3 Encounters:  05/13/16 218 lb (98.884 kg)  04/30/16 220 lb (99.791 kg)  04/09/16 213 lb (96.616 kg)      Physical Exam  Constitutional: She is oriented to person, place, and time. No distress.  Obese  HENT:  Head: Normocephalic and atraumatic.  Right Ear: External ear normal.  Left Ear: External ear normal.  Nose: Nose normal.  Mouth/Throat: Oropharynx is clear and moist.  Right EAC partially occluded with cerumen. Following removal right TM retraction noted.  Eyes: Conjunctivae and EOM are normal.  Neck: Normal range of motion. Neck supple. No JVD present. No tracheal deviation present. No thyromegaly present.  Cardiovascular: Normal rate, regular rhythm and intact distal pulses.  Exam reveals no gallop and no friction rub.   Murmur (2/6 SEM at the aortic area and LSB) heard. 3/6 aortic ejection murmur  Pulmonary/Chest: No respiratory distress. She has no wheezes. She exhibits no tenderness.  Abdominal: Bowel sounds are normal. She exhibits no distension and no mass. There is no tenderness.  Musculoskeletal: Normal range of motion. She exhibits edema. She exhibits no tenderness.  Pain in the both shoulders (R>L) with rotational movement. Pain in both knees (R>L).  Lymphadenopathy:    She has no cervical adenopathy.  Neurological: She is alert and  oriented to person, place, and time. She has normal reflexes. No cranial nerve deficit. Coordination normal.  Skin: No rash noted. No erythema. No pallor.  Psychiatric: She has a normal mood and affect. Judgment and thought content normal.    Labs reviewed: Lab Summary Latest Ref Rng 04/28/2016 04/07/2016  Hemoglobin 11.1 - 15.9 g/dL 10.5(L) 10.0(L)  Hematocrit 34.0 - 46.6 % 33.1(L) 32.1(L)  White count 3.4 - 10.8 x10E3/uL 6.5 6.2  Platelet count 150 - 379 x10E3/uL 278 298  Sodium 134 - 144 mmol/L 139 137  Potassium 3.5 - 5.2 mmol/L 4.2 4.3  Calcium 8.7 - 10.3 mg/dL 8.8 8.9  Phosphorus - (None) (None)  Creatinine 0.57 - 1.00 mg/dL 1.27(H) 1.27(H)  AST 0 - 40 IU/L 19 16  Alk Phos 39 - 117 IU/L 70 65  Bilirubin 0.0 - 1.2 mg/dL 0.2 0.3  Glucose 65 - 99 mg/dL 119(H) 98  Cholesterol - (None) (None)  HDL cholesterol >39 mg/dL 61 (None)  Triglycerides 0 - 149 mg/dL 174(H) (None)  LDL Direct - (None) (None)  LDL Calc 0 - 99 mg/dL 86 (None)  Total protein 6.1 - 8.1 g/dL (None) 6.9  Albumin 3.5 - 4.8 g/dL 4.2 3.8   Lab Results  Component Value Date   TSH 6.030* 04/28/2016   TSH 4.09 04/07/2016   TSH 5.73* 02/05/2016   Lab Results  Component Value Date   BUN 27 04/28/2016   BUN 32* 04/07/2016   BUN 30* 02/29/2016   Lab Results  Component Value Date   HGBA1C 6.8* 04/28/2016   HGBA1C 7.2* 12/07/2015   HGBA1C 7.0* 09/24/2015    Assessment/Plan  1. Essential hypertension continue current meds. Lose weight.  2. Obesity Educated in ways to lose weight.

## 2016-05-14 ENCOUNTER — Telehealth: Payer: Self-pay | Admitting: Cardiology

## 2016-05-14 NOTE — Telephone Encounter (Signed)
New message    Pt verbalized that she is returning call for rn

## 2016-05-14 NOTE — Telephone Encounter (Signed)
Returned call to patient no answer.Left message on personal voice mail lab work just done 04/2016.Advised to keep appointment with Dr.Jordan 08/07/16 at 9:45 am.

## 2016-05-14 NOTE — Telephone Encounter (Signed)
Returned call to patient. She was returning a call to [facility] about an appointment. Scheduled patient for 10/12 with Dr. [place]. She wanted to know if she needed labs - routed to [facility] to assist

## 2016-05-15 ENCOUNTER — Ambulatory Visit
Admission: RE | Admit: 2016-05-15 | Discharge: 2016-05-15 | Disposition: A | Discharge status: Home / Self Care | Payer: Medicare Other | Source: Ambulatory Visit | Attending: Internal Medicine | Admitting: Internal Medicine

## 2016-05-15 DIAGNOSIS — Z1231 Encounter for screening mammogram for malignant neoplasm of breast: Secondary | ICD-10-CM | POA: Diagnosis not present

## 2016-05-22 ENCOUNTER — Other Ambulatory Visit: Payer: Self-pay | Admitting: Physician Assistant

## 2016-05-25 ENCOUNTER — Other Ambulatory Visit: Payer: Self-pay | Admitting: Internal Medicine

## 2016-05-25 DIAGNOSIS — N393 Stress incontinence (female) (male): Secondary | ICD-10-CM

## 2016-06-10 DIAGNOSIS — D1801 Hemangioma of skin and subcutaneous tissue: Secondary | ICD-10-CM | POA: Diagnosis not present

## 2016-06-10 DIAGNOSIS — L821 Other seborrheic keratosis: Secondary | ICD-10-CM | POA: Diagnosis not present

## 2016-06-10 DIAGNOSIS — L820 Inflamed seborrheic keratosis: Secondary | ICD-10-CM | POA: Diagnosis not present

## 2016-06-10 DIAGNOSIS — D225 Melanocytic nevi of trunk: Secondary | ICD-10-CM | POA: Diagnosis not present

## 2016-06-10 DIAGNOSIS — L309 Dermatitis, unspecified: Secondary | ICD-10-CM | POA: Diagnosis not present

## 2016-06-27 ENCOUNTER — Other Ambulatory Visit: Payer: Self-pay

## 2016-06-27 DIAGNOSIS — D649 Anemia, unspecified: Secondary | ICD-10-CM

## 2016-06-27 DIAGNOSIS — E119 Type 2 diabetes mellitus without complications: Secondary | ICD-10-CM

## 2016-07-01 ENCOUNTER — Telehealth: Payer: Self-pay

## 2016-07-01 MED ORDER — FERROUS SULFATE 325 (65 FE) MG PO TBEC: 325.0000 mg | DELAYED_RELEASE_TABLET | Freq: Every day | ORAL | 6 refills | Status: DC

## 2016-07-01 NOTE — Telephone Encounter (Signed)
Received fax from pharmacy. Sending request electronically.

## 2016-07-02 ENCOUNTER — Other Ambulatory Visit: Payer: Self-pay | Admitting: Cardiology

## 2016-07-02 MED ORDER — FERROUS SULFATE 325 (65 FE) MG PO TBEC: 325.0000 mg | DELAYED_RELEASE_TABLET | Freq: Every day | ORAL | 6 refills | Status: DC

## 2016-07-05 ENCOUNTER — Other Ambulatory Visit: Payer: Self-pay | Admitting: Internal Medicine

## 2016-07-06 ENCOUNTER — Other Ambulatory Visit: Payer: Self-pay | Admitting: Internal Medicine

## 2016-07-06 DIAGNOSIS — E039 Hypothyroidism, unspecified: Secondary | ICD-10-CM

## 2016-07-16 ENCOUNTER — Other Ambulatory Visit: Payer: Self-pay | Admitting: Internal Medicine

## 2016-07-16 DIAGNOSIS — E119 Type 2 diabetes mellitus without complications: Secondary | ICD-10-CM

## 2016-07-18 ENCOUNTER — Telehealth: Payer: Self-pay | Admitting: *Deleted

## 2016-07-18 NOTE — Telephone Encounter (Signed)
Patient notified and scheduled appointment.

## 2016-07-18 NOTE — Telephone Encounter (Signed)
Patient stated that she has asthma bad and her daughter which is a respiratory therapist brought her a nebulizer and albuterol vials. Patient stated that this helped. Patient is on Albuterol inhaler and states she can tolerate. Patient wants the Nebulizer vials called in because the Nebulizer helps. Please Advise.

## 2016-07-18 NOTE — Telephone Encounter (Signed)
Recommend she be seen to determine whether neb is appropriate for her

## 2016-07-21 ENCOUNTER — Telehealth: Payer: Self-pay | Admitting: Nurse Practitioner

## 2016-07-21 ENCOUNTER — Ambulatory Visit (INDEPENDENT_AMBULATORY_CARE_PROVIDER_SITE_OTHER): Payer: Medicare Other | Admitting: Nurse Practitioner

## 2016-07-21 ENCOUNTER — Encounter: Payer: Self-pay | Admitting: Nurse Practitioner

## 2016-07-21 VITALS — BP 160/68 | HR 72 | Temp 98.1°F | Resp 17 | Ht 63.0 in | Wt 219.2 lb

## 2016-07-21 DIAGNOSIS — J449 Chronic obstructive pulmonary disease, unspecified: Secondary | ICD-10-CM

## 2016-07-21 DIAGNOSIS — N3281 Overactive bladder: Secondary | ICD-10-CM

## 2016-07-21 DIAGNOSIS — I480 Paroxysmal atrial fibrillation: Secondary | ICD-10-CM | POA: Diagnosis not present

## 2016-07-21 MED ORDER — PANTOPRAZOLE SODIUM 40 MG PO TBEC: 40.0000 mg | DELAYED_RELEASE_TABLET | Freq: Every day | ORAL | 1 refills | Status: DC

## 2016-07-21 MED ORDER — MIRABEGRON ER 25 MG PO TB24: 25.0000 mg | ORAL_TABLET | Freq: Every day | ORAL | Status: DC

## 2016-07-21 NOTE — Patient Instructions (Signed)
Stop oxybutynin  Start Myrbetriq 25 mg daily for over active bladder

## 2016-07-21 NOTE — Telephone Encounter (Signed)
FYI, Called patient to discuss the referral for Pulmonology, patient stated she was not going to do it right now, thanked me and hung up.

## 2016-07-21 NOTE — Progress Notes (Signed)
Careteam: Patient Care Team: Estill Dooms, MD as PCP - General (Internal Medicine) [name], MD as Consulting Physician (Orthopedic Surgery) Druscilla Brownie, MD as Consulting Physician (Dermatology) Ralene Bathe, MD as Consulting Physician (Ophthalmology) Ralene Bathe, MD as Consulting Physician (Ophthalmology)  Advanced Directive information Does patient have an advance directive?: Yes, Type of Advance Directive: Living will  Allergies  Allergen Reactions  . Codeine Other (See Comments)    "spaces her out, dizziness, can't walk well"  . Vesicare [Solifenacin] Other (See Comments)    unknown    Chief Complaint  Patient presents with  . Acute Visit    Wants to discuss getting nebulizer and albuterol prescripiton for asthma. Pt has been using daughter's neb and meds.   . Other    Does not want flu shot.      HPI: Patient is a 80 y.o. female seen in the office today requesting nebulizer for asthma.  Pt with hx of afib (s/p TEE/DCCV 09/2015 and started on amio), anticoag w/ Xarelto, AS, HTN, DM, HLD, obesity, anemia, D-CHF. EF 55-60% w/ mod-severe AS by echo 09/2015. Reports she has had asthma for many years. Unknown when her last PFT. A few weeks ago had an attack and called EMS, they gave her oxygen. Afterwards her daughter came and gave her nebulizer treatment and she has been using her daughters nebulizer since but needing to give it back. Has not seen a pulmonologist in many years.  Advair Diskus 100-50 1 puff twice daily Albuterol inhaler using as needed which ends up being several times- on avg using 3 times a week, also using nebulizer about 3 times a week but feels like the nebulizer is more effective.  Taking protonix which has helped symptoms No increase in cough or congestion Cough every now and then  Mouth is very dry, asking for water during the visit. Taking oxybutynin 5 mg BID, does not feel like this is even helping her OAB. Stopped taking  medication for 2 weeks and did not notice a difference in symptoms, starting taking it back just because.   Review of Systems:  Review of Systems  Constitutional: Negative for activity change, appetite change, chills, diaphoresis, fatigue, fever and unexpected weight change.       Gaining weight  HENT: Positive for postnasal drip. Negative for congestion, ear discharge, ear pain, hearing loss, rhinorrhea, sore throat, tinnitus, trouble swallowing and voice change.   Eyes: Negative for pain, redness, itching and visual disturbance.  Respiratory: Negative for cough, choking, shortness of breath and wheezing.   Cardiovascular: Negative for chest pain, palpitations and leg swelling.       Aortic valve ejection murmur  Gastrointestinal: Negative for abdominal distention, abdominal pain, constipation, diarrhea and nausea.  Endocrine: Negative for cold intolerance, heat intolerance, polydipsia, polyphagia and polyuria.       Diabetic. Hypothyroid.  Genitourinary: Positive for frequency. Negative for difficulty urinating, dysuria, flank pain, hematuria, pelvic pain, urgency and vaginal discharge.  Musculoskeletal: Positive for arthralgias and back pain. Negative for gait problem, myalgias, neck pain and neck stiffness.       Post right shoulder arthroplasty in Feb. 2017.   Skin: Negative for color change, pallor and rash.  Allergic/Immunologic: Negative.   Neurological: Negative for dizziness, tremors, seizures, syncope, weakness, numbness and headaches.  Hematological: Negative for adenopathy. Does not bruise/bleed easily.       Anemic   Psychiatric/Behavioral: Negative.  Negative for agitation, behavioral problems, confusion, dysphoric mood, hallucinations, sleep disturbance and suicidal  ideas. The patient is not nervous/anxious and is not hyperactive.     Past Medical History:  Diagnosis Date  . Allergy   . Anemia, unspecified   . Arthritis   . Asthma   . Atrial fibrillation status post  cardioversion [facility]) 09/2015   s/p TEE/DCCV>>SR on amio  . Benign neoplasm of colon   . Carpal tunnel syndrome   . Cellulitis and abscess of finger, unspecified   . Cerumen impaction   . Cervicalgia   . Chronic airway obstruction, not elsewhere classified   . Chronic anticoagulation 09/2015   Xarelto for afib, CHADS2VASC=5  . Diabetes mellitus without complication ([place])   . Diarrhea   . Diffuse cystic mastopathy   . Dysrhythmia   . Edema   . Encounter for long-term (current) use of other medications   . Heart murmur   . Lumbago   . Neck pain 05/23/2015  . Obesity, unspecified   . Other and unspecified hyperlipidemia   . Other psoriasis   . Pain in joint, lower leg   . PONV (postoperative nausea and vomiting)   . Primary localized osteoarthrosis of right shoulder 12/18/2015  . Routine gynecological examination   . Scoliosis (and kyphoscoliosis), idiopathic   . Severe aortic stenosis   . Shortness of breath dyspnea    with exertion  . Type II or unspecified type diabetes mellitus without mention of complication, uncontrolled   . Unspecified essential hypertension   . Unspecified hemorrhoids without mention of complication   . Unspecified hypothyroidism   . Urge incontinence    Past Surgical History:  Procedure Laterality Date  . ABDOMINAL HYSTERECTOMY  1987   complete  . BREAST SURGERY     right breast 3 surgeries [phone]  . CARDIOVERSION N/A 10/19/2015   Procedure: CARDIOVERSION;  Surgeon: Lelon Perla, MD;  Location: [place];  Service: Cardiovascular;  Laterality: N/A;  . [place]  . COLON SURGERY     2004,2007,2013.  3 times colon surgieres  . EXCISE LE MANDIBULAR LYMPH NODE T     DR C. NEWMAN  . FOOT SURGERY  1989  . LEFT SHOULDER ARTHROSCOPY    . 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 NEUROPLASTY / TRANSPOSITION MEDIAN NERVE AT CARPAL TUNNEL BILATERAL  05/2003  . TEE WITHOUT CARDIOVERSION N/A 10/19/2015   Procedure:  Transesophageal Echocardiogram (TEE) ;  Surgeon: Lelon Perla, MD;  Location: [facility] ENDOSCOPY;  Service: Cardiovascular;  Laterality: N/A;  . TOTAL SHOULDER ARTHROPLASTY Right 12/18/2015   Procedure: TOTAL SHOULDER ARTHROPLASTY;  Surgeon: [name], MD;  Location: [place];  Service: Orthopedics;  Laterality: Right;  . TRIGGER FINGER RELEASE Left   . VAGINAL CYST REMOVED  1967   Social History:   reports that she quit smoking about 26 years ago. She has never used smokeless tobacco. She reports that she does not drink alcohol or use drugs.  Family History  Problem Relation Age of Onset  . Diabetes Father   . Stroke Father   . Heart disease Father   . Liver cancer Sister   . Heart disease Brother   . Asthma Sister   . Arthritis Sister     Medications: Patient's Medications  New Prescriptions   No medications on file  Previous Medications   ADVAIR DISKUS 100-50 MCG/DOSE AEPB    Inhale 1 inhalation two  times daily for breathing   ALBUTEROL (PROVENTIL HFA;VENTOLIN HFA) 108 (90 BASE) MCG/ACT INHALER    Inhale 2 puffs into  the lungs every 6 (six) hours as needed for wheezing or shortness of breath.   AMIODARONE (PACERONE) 200 MG TABLET    Take one tablet daily by mouth   CETIRIZINE (ZYRTEC) 10 MG TABLET    Take 10 mg by mouth daily as needed for allergies.    CHOLECALCIFEROL (VITAMIN D) 1000 UNITS TABLET    One daily for vitamin D supplement   COMFORT LANCETS MISC    Use daily to get blood for glucometer   DILTIAZEM (CARDIZEM CD) 180 MG 24 HR CAPSULE    Take 1 capsule by mouth  once a day to control blood pressure   DOXAZOSIN (CARDURA) 4 MG TABLET    Take one tablet by mouth once daily to help control blood pressure   FERROUS SULFATE 325 (65 FE) MG EC TABLET    Take 1 tablet (325 mg total) by mouth daily with breakfast.   FUROSEMIDE (LASIX) 40 MG TABLET    Take 40 mg by mouth daily.   GLIMEPIRIDE (AMARYL) 2 MG TABLET    Take 1 tablet by mouth at  breakfast to control  Glucose    GLUCOSE BLOOD (BAYER CONTOUR TEST) TEST STRIP    Use daily too check glucose   LEVOTHYROXINE (SYNTHROID, LEVOTHROID) 50 MCG TABLET    Take 1 tablet by mouth  daily for thyroid  supplement   METFORMIN (GLUCOPHAGE) 500 MG TABLET    TAKE ONE TABLET BEFORE  BREAKFAST AND ONE TABLET  BEFORE SUPPER TO CONTROL  BLOOD SUGARS   OXYBUTYNIN (DITROPAN) 5 MG TABLET    Take 1 tablet by mouth  twice daily to help control bladder   PANTOPRAZOLE (PROTONIX) 40 MG TABLET    Take 1 tablet by mouth daily.   POTASSIUM 95 MG TABS    Take 1 tablet by mouth 3 (three) times a week. Pt only takes when taking LASIX   PRAVASTATIN (PRAVACHOL) 20 MG TABLET    Take 1 tablet by mouth daily.   TRIAMCINOLONE (KENALOG) 0.025 % CREAM    Take 55 application by mouth 2 times daily.  Modified Medications   No medications on file  Discontinued Medications   OXYBUTYNIN (DITROPAN) 5 MG TABLET    Take 5 mg by mouth 2 (two) times daily. To help control bladder   PANTOPRAZOLE (PROTONIX) 40 MG TABLET    Take 40 mg by mouth.     Physical Exam:  Vitals:   07/21/16 1346  BP: (!) 160/68  Pulse: 72  Resp: 17  Temp: 98.1 F (36.7 C)  TempSrc: Oral  SpO2: 91%  Weight: 219 lb 3.2 oz (99.4 kg)  Height: _0  (1.6 m)   Body mass index is 38.83 kg/m.  Physical Exam  Constitutional: She is oriented to person, place, and time. No distress.  Obese  HENT:  Head: Normocephalic and atraumatic.  Eyes: Conjunctivae and EOM are normal.  Neck: Normal range of motion. Neck supple.  Cardiovascular: Normal rate, regular rhythm and intact distal pulses.  Exam reveals no gallop and no friction rub.   Murmur (2/6 SEM at the aortic area and LSB) heard. 3/6 aortic ejection murmur  Pulmonary/Chest: No respiratory distress. She has no wheezes. She exhibits no tenderness.  Abdominal: Bowel sounds are normal.  Musculoskeletal: Normal range of motion. She exhibits edema. She exhibits no tenderness.  Pain in the both shoulders (R>L) with rotational  movement. Pain in both knees (R>L).  Neurological: She is alert and oriented to person, place, and time. She has normal  reflexes. No cranial nerve deficit. Coordination normal.  Skin: No rash noted. No erythema. No pallor.  Psychiatric: She has a normal mood and affect. Judgment and thought content normal.    Labs reviewed: Basic Metabolic Panel:  Recent Labs  10/18/15 1800  10/19/15 0334  02/05/16 1041  02/29/16 1522 04/07/16 1037 04/28/16 0940  NA 137  --  140  < > 137  < > 140 137 139  K 4.4  --  3.9  < > 4.5  < > 4.5 4.3 4.2  CL 103  --  102  < > 97*  < > 102 99 95*  CO2 24  --  28  < > 28  < > _0 GLUCOSE 115*  --  149*  < > 115*  < > 57* 98 119*  BUN 19  --  20  < > 39*  < > 30* 32* 27  CREATININE 0.96  --  1.05*  < > 1.39*  < > 1.00* 1.27* 1.27*  CALCIUM 9.1  --  9.0  < > 9.2  < > 9.4 8.9 8.8  MG 2.0  --  1.8  --   --   --   --   --   --   TSH  --   < >  --   --  5.73*  --   --  4.09 6.030*  < > = values in this interval not displayed. Liver Function Tests:  Recent Labs  10/19/15 0334 04/07/16 1037 04/28/16 0940  AST _1 ALT _2 ALKPHOS 57 65 70  BILITOT 0.7 0.3 0.2  PROT 6.8 6.9 7.5  ALBUMIN 3.4* 3.8 4.2   No results for input(s): LIPASE, AMYLASE in the last 8760 hours. No results for input(s): AMMONIA in the last 8760 hours. CBC:  Recent Labs  02/13/16 1119 02/29/16 1528 04/07/16 1037 04/28/16 0940  WBC 7.3 7.0 6.2 6.5  NEUTROABS 5,256 3,710  --  3.8  HGB 9.2* 9.6* 10.0*  --   HCT 28.7* 30.1* 32.1* 33.1*  MCV 87.2 85.0 83.8 83  PLT 322 350 298 278   Lipid Panel:  Recent Labs  09/24/15 0916 04/28/16 0940  CHOL 165 182  HDL 53 61  LDLCALC 88 86  TRIG 119 174*  CHOLHDL 3.1 3.0   TSH:  Recent Labs  02/05/16 1041 04/07/16 1037 04/28/16 0940  TSH 5.73* 4.09 6.030*   A1C: Lab Results  Component Value Date   HGBA1C 6.8 (H) 04/28/2016     Assessment/Plan 1. Chronic obstructive pulmonary disease, unspecified COPD  type ([place]) -pt taking albuterol multiple times throughout the week, reports she has asthma, possible asthma vs COPD, no PFTs documented in epic, due to risk vs benefit of albuterol will get referral to pulmonary before ordering nebulizer to see if possible maintenance medication can be adjusted for better control of respiratory symptoms  - Ambulatory referral to Pulmonology  2. OAB (overactive bladder) -to stop oxybutynin - mirabegron ER (MYRBETRIQ) 25 MG TB24 tablet; Take 1 tablet (25 mg total) by mouth daily.  Dispense: 30 tablet  3. Paroxysmal atrial fibrillation (HCC) Rate controlled at this time, conts on amiodarone and Cardizem, not currently on anticoagulation due anemia and risk of bleeding   Jamila Slatten K. Harle Battiest  [facility] [phone] 8 am - 5 pm) [phone] (after hours)

## 2016-07-28 DIAGNOSIS — M19011 Primary osteoarthritis, right shoulder: Secondary | ICD-10-CM | POA: Diagnosis not present

## 2016-08-01 ENCOUNTER — Other Ambulatory Visit: Payer: Self-pay

## 2016-08-01 DIAGNOSIS — I350 Nonrheumatic aortic (valve) stenosis: Secondary | ICD-10-CM

## 2016-08-01 DIAGNOSIS — I5032 Chronic diastolic (congestive) heart failure: Secondary | ICD-10-CM

## 2016-08-04 ENCOUNTER — Other Ambulatory Visit: Payer: Self-pay

## 2016-08-04 DIAGNOSIS — I350 Nonrheumatic aortic (valve) stenosis: Secondary | ICD-10-CM

## 2016-08-07 ENCOUNTER — Ambulatory Visit (INDEPENDENT_AMBULATORY_CARE_PROVIDER_SITE_OTHER): Payer: Medicare Other | Admitting: Cardiology

## 2016-08-07 ENCOUNTER — Encounter: Payer: Self-pay | Admitting: Cardiology

## 2016-08-07 VITALS — BP 136/62 | HR 66 | Ht 63.0 in | Wt 221.4 lb

## 2016-08-07 DIAGNOSIS — I350 Nonrheumatic aortic (valve) stenosis: Secondary | ICD-10-CM | POA: Diagnosis not present

## 2016-08-07 DIAGNOSIS — I10 Essential (primary) hypertension: Secondary | ICD-10-CM

## 2016-08-07 DIAGNOSIS — I5032 Chronic diastolic (congestive) heart failure: Secondary | ICD-10-CM

## 2016-08-07 DIAGNOSIS — I480 Paroxysmal atrial fibrillation: Secondary | ICD-10-CM

## 2016-08-07 NOTE — Progress Notes (Addendum)
Cardiology Office Note   Date:  08/07/2016   ID:  Patricia [name], DOB 1936-08-06, MRN [id number]  PCP:  Jeanmarie Hubert, MD  Cardiologist:   [name] Martinique, MD   Chief Complaint  Patient presents with  . Follow-up  . Edema    left leg more than right leg       History of Present Illness: Patricia [name] is a 80 y.o. female who presents for follow up atrial fibrillation and  aortic stenosis. She has a history of HTN, obesity, DM, HL, and anemia. She was admitted to the hospital in December 2016 with increased dyspnea. She was found to be in atrial fibrillation with RVR. She had pulmonary edema. Hgb was 10. Echo showed EF 55-60% with moderate to severe AS. She was anticoagulated with Xarelto and started on amiodarone. She had TEE guided DCCV with return to NSR. She was seen back in the office on 11/20/15 and was still in NSR.  On 12/18/15 she underwent right shoulder arthroplasty. Post op Hgb was 9.6. It subsequently dropped to 7.1. She was transfused. She has been on iron. Hgb improved to 10. She did have EGD and colonoscopy with both gastric and colonic polyps. No bleeding seen.   Most recent Echo in July as noted below.  On follow up today she states she is doing fair. 6 weeks ago she noted an episode of significant SOB and wheezing. She called the paramedics. She was in NSR. She was given a breathing treatment with relief. She does note occasional SOB with exertion. She has some left foot swelling. No chest pain or dizziness.     Past Medical History:  Diagnosis Date  . Allergy   . Anemia, unspecified   . Arthritis   . Asthma   . Atrial fibrillation status post cardioversion [facility]) 09/2015   s/p TEE/DCCV>>SR on amio  . Benign neoplasm of colon   . Carpal tunnel syndrome   . Cellulitis and abscess of finger, unspecified   . Cerumen impaction   . Cervicalgia   . Chronic airway obstruction, not elsewhere classified   . Chronic anticoagulation 09/2015   Xarelto for afib,  CHADS2VASC=5  . Diabetes mellitus without complication ([place])   . Diarrhea   . Diffuse cystic mastopathy   . Dysrhythmia   . Edema   . Encounter for long-term (current) use of other medications   . Heart murmur   . Lumbago   . Neck pain 05/23/2015  . Obesity, unspecified   . Other and unspecified hyperlipidemia   . Other psoriasis   . Pain in joint, lower leg   . PONV (postoperative nausea and vomiting)   . Primary localized osteoarthrosis of right shoulder 12/18/2015  . Routine gynecological examination   . Scoliosis (and kyphoscoliosis), idiopathic   . Severe aortic stenosis   . Shortness of breath dyspnea    with exertion  . Type II or unspecified type diabetes mellitus without mention of complication, uncontrolled   . Unspecified essential hypertension   . Unspecified hemorrhoids without mention of complication   . Unspecified hypothyroidism   . Urge incontinence     Past Surgical History:  Procedure Laterality Date  . ABDOMINAL HYSTERECTOMY  1987   complete  . BREAST SURGERY     right breast 3 surgeries [phone]  . CARDIOVERSION N/A 10/19/2015   Procedure: CARDIOVERSION;  Surgeon: Lelon Perla, MD;  Location: [place];  Service: Cardiovascular;  Laterality: N/A;  . CHOLECYSTECTOMY  1992  DR BOWMAN  . COLON SURGERY     2004,2007,2013.  3 times colon surgieres  . EXCISE LE MANDIBULAR LYMPH NODE T     DR C. NEWMAN  . FOOT SURGERY  1989  . LEFT SHOULDER ARTHROSCOPY    . 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 NEUROPLASTY / TRANSPOSITION MEDIAN NERVE AT CARPAL TUNNEL BILATERAL  05/2003  . TEE WITHOUT CARDIOVERSION N/A 10/19/2015   Procedure: Transesophageal Echocardiogram (TEE) ;  Surgeon: Brian S Crenshaw, MD;  Location: [facility] ENDOSCOPY;  Service: Cardiovascular;  Laterality: N/A;  . TOTAL SHOULDER ARTHROPLASTY Right 12/18/2015   Procedure: TOTAL SHOULDER ARTHROPLASTY;  Surgeon: Joshua Landau, MD;  Location: [facility] OR;  Service: Orthopedics;  Laterality: Right;  .  TRIGGER FINGER RELEASE Left   . VAGINAL CYST REMOVED  1967     Current Outpatient Prescriptions  Medication Sig Dispense Refill  . ADVAIR DISKUS 100-50 MCG/DOSE AEPB Inhale 1 inhalation two  times daily for breathing 180 each 3  . albuterol (PROVENTIL HFA;VENTOLIN HFA) 108 (90 BASE) MCG/ACT inhaler Inhale 2 puffs into the lungs every 6 (six) hours as needed for wheezing or shortness of breath. 1 Inhaler 0  . amiodarone (PACERONE) 200 MG tablet Take one tablet daily by mouth 90 tablet 3  . cetirizine (ZYRTEC) 10 MG tablet Take 10 mg by mouth daily as needed for allergies.     . cholecalciferol (VITAMIN D) 1000 units tablet One daily for vitamin D supplement 100 tablet 4  . COMFORT LANCETS MISC Use daily to get blood for glucometer 100 each 3  . diltiazem (CARDIZEM CD) 180 MG 24 hr capsule Take 1 capsule by mouth  once a day to control blood pressure 90 capsule 3  . doxazosin (CARDURA) 4 MG tablet Take one tablet by mouth once daily to help control blood pressure 90 tablet 3  . ferrous sulfate 325 (65 FE) MG EC tablet Take 1 tablet (325 mg total) by mouth daily with breakfast. 30 tablet 6  . furosemide (LASIX) 40 MG tablet Take 40 mg by mouth daily.    . glimepiride (AMARYL) 2 MG tablet Take 1 tablet by mouth at  breakfast to control  Glucose 90 tablet 0  . glucose blood (BAYER CONTOUR TEST) test strip Use daily too check glucose 100 each 4  . levothyroxine (SYNTHROID, LEVOTHROID) 50 MCG tablet Take 1 tablet by mouth  daily for thyroid  supplement 90 tablet 1  . metFORMIN (GLUCOPHAGE) 500 MG tablet TAKE ONE TABLET BEFORE  BREAKFAST AND ONE TABLET  BEFORE SUPPER TO CONTROL  BLOOD SUGARS 180 tablet 3  . mirabegron ER (MYRBETRIQ) 25 MG TB24 tablet Take 1 tablet (25 mg total) by mouth daily. 30 tablet   . pantoprazole (PROTONIX) 40 MG tablet Take 1 tablet (40 mg total) by mouth daily. 90 tablet 1  . Potassium 95 MG TABS Take 1 tablet by mouth 3 (three) times a week. Pt only takes when taking LASIX     . pravastatin (PRAVACHOL) 20 MG tablet Take 1 tablet by mouth daily.    . triamcinolone (KENALOG) 0.025 % cream Apply 1 application topically as directed.     No current facility-administered medications for this visit.     Allergies:   Codeine and Vesicare [solifenacin]    Social History:  The patient  reports that she quit smoking about 26 years ago. She has never used smokeless tobacco. She reports that she does not drink alcohol or use drugs.   Family History:  The patient's family   history includes Arthritis in her sister; Asthma in her sister; Diabetes in her father; Heart disease in her brother and father; Liver cancer in her sister; Stroke in her father.    ROS:  Please see the history of present illness.   Otherwise, review of systems are positive for none.   All other systems are reviewed and negative.    PHYSICAL EXAM: VS:  BP 136/62   Pulse 66   Ht _0  (1.6 m)   Wt 221 lb 6.4 oz (100.4 kg)   BMI 39.22 kg/m  , BMI Body mass index is 39.22 kg/m. GEN: Well nourished, obese, in no acute distress  HEENT: normal  Neck: no JVD, carotid bruits, or masses Cardiac: RRR; harsh gr 3/6 systolic murmur RUSB>> apex and back. No gallop  Respiratory:  clear to auscultation bilaterally, normal work of breathing GI: soft, nontender, nondistended, + BS MS: no deformity or atrophy  Skin: warm and dry, no rash. Trace LE edema.  Neuro:  Strength and sensation are intact Psych: euthymic mood, full affect   EKG:  EKG is not ordered today.  Recent Labs: Lab Results  Component Value Date   WBC 6.5 04/28/2016   HGB 10.0 (L) 04/07/2016   HCT 33.1 (L) 04/28/2016   PLT 278 04/28/2016   GLUCOSE 119 (H) 04/28/2016   CHOL 182 04/28/2016   TRIG 174 (H) 04/28/2016   HDL 61 04/28/2016   LDLCALC 86 04/28/2016   ALT 20 04/28/2016   AST 19 04/28/2016   NA 139 04/28/2016   K 4.2 04/28/2016   CL 95 (L) 04/28/2016   CREATININE 1.27 (H) 04/28/2016   BUN 27 04/28/2016   CO2 27 04/28/2016     TSH 6.030 (H) 04/28/2016   INR 1.14 10/19/2015   HGBA1C 6.8 (H) 04/28/2016      Lipid Panel    Component Value Date/Time   CHOL 182 04/28/2016 0940   TRIG 174 (H) 04/28/2016 0940   HDL 61 04/28/2016 0940   CHOLHDL 3.0 04/28/2016 0940   LDLCALC 86 04/28/2016 0940      Wt Readings from Last 3 Encounters:  08/07/16 221 lb 6.4 oz (100.4 kg)  07/21/16 219 lb 3.2 oz (99.4 kg)  05/13/16 218 lb (98.9 kg)      Other studies Reviewed: Additional studies/ records that were reviewed today include: Echo 04/30/16:Study Conclusions  - Left ventricle: The cavity size was normal. There was moderate   concentric hypertrophy. Systolic function was normal. The   estimated ejection fraction was in the range of 55% to 60%. Wall   motion was normal; there were no regional wall motion   abnormalities. Features are consistent with a pseudonormal left   ventricular filling pattern, with concomitant abnormal relaxation   and increased filling pressure (grade 2 diastolic dysfunction). - Aortic valve: There was moderate to severe stenosis. Mean   gradient (S): 40 mm Hg. Peak gradient (S): 65 mm Hg. Valve area   (VTI): 1.07 cm^2. Valve area (Vmax): 1 cm^2. Valve area (Vmean):   0.94 cm^2. - Mitral valve: Calcified annulus. Mildly thickened leaflets .   There was mild regurgitation. Valve area by continuity equation   (using LVOT flow): 1.88 cm^2. - Left atrium: The atrium was moderately to severely dilated. - Pulmonary arteries: Systolic pressure was mildly increased. PA   peak pressure: 39 mm Hg (S).    ASSESSMENT AND PLAN:  1.  Atrial fibrillation. S/p DCCV in December. Maintaining NSR on amiodarone.  We again discussed anticoagulation.  I am concerned about her risk of bleeding with prior heme + stool and significant anemia. No clear cut source on GI evaluation but polyps noted. I would favor holding off on anticoagulation for now.  If she should have recurrent Afib we would need to  reconsider.   2.  Severe Aortic stenosis. Echo in July showed progression with velocity > 4 cm/sec and mean gradient of 40 mm Hg. Discussed that she may need to be considered for AV surgery in the near future. I think we need to start planning for valve replacement in the next 6 months. The next step would be to perform a right and left heart cath. She is going to discuss with her family and we will coordinate a convenient time to schedule this with me. The procedure and risks were reviewed including but not limited to death, myocardial infarction, stroke, arrythmias, bleeding, transfusion, emergency surgery, dye allergy, or renal dysfunction. The patient voices understanding and is agreeable to proceed..  We also discussed options for AVR including open heart surgery vs TAVR. If open heart surgery planned would consider concomitant MAZE procedure and LA ligation. Once cardiac cath performed will refer to CT surgery/valve clinic for recommendations.   3. Anemia Fe deficient. Last Hgb 10. Will hold Xarelto for now. Will repeat lab work prior to cardiac cath.   4. Chronic kidney disease stage 3.   5. S/p right shoulder arthroplasty.   6. Chronic diastolic CHF. Not currently volume overloaded.  7. HTN  8. DM type 2.     Current medicines are reviewed at length with the patient today.  The patient does not have concerns regarding medicines.  The following changes have been made:  See above  Labs/ tests ordered today include:   No orders of the defined types were placed in this encounter.    Disposition:   Will arrange [facility] when convenient for patient.   Signed, [name] Martinique, MD  08/07/2016 10:55 AM    [facility] [address] [place], [place], [address] Phone [phone], Fax [phone]

## 2016-08-13 ENCOUNTER — Telehealth: Payer: Self-pay | Admitting: Cardiology

## 2016-08-13 DIAGNOSIS — I10 Essential (primary) hypertension: Secondary | ICD-10-CM

## 2016-08-13 DIAGNOSIS — I480 Paroxysmal atrial fibrillation: Secondary | ICD-10-CM

## 2016-08-13 DIAGNOSIS — I5032 Chronic diastolic (congestive) heart failure: Secondary | ICD-10-CM

## 2016-08-13 NOTE — Telephone Encounter (Signed)
New message      Returning a call to the nurse

## 2016-08-14 ENCOUNTER — Telehealth: Payer: Self-pay | Admitting: Internal Medicine

## 2016-08-14 NOTE — Telephone Encounter (Signed)
Rt and lf cardiac cath scheduled 09/02/16 with Dr.Jordan.Pt to pick up instructions and have pre cath lab 08/27/16.

## 2016-08-14 NOTE — Telephone Encounter (Signed)
Returned call to patient 08/13/16.She stated she is ready to schedule rt and lf cardiac cath.Cath scheduled 09/02/16 with Dr.McAlhany.Patient to pick up instructions and have pre cath lab 08/27/16.

## 2016-08-14 NOTE — Telephone Encounter (Signed)
left msg asking if pt can come at 3:00 on 11/21 to see nurse for AWV before visit with Dr. Nyoka Cowden. VDM (dee-dee)

## 2016-08-15 ENCOUNTER — Other Ambulatory Visit: Payer: Self-pay | Admitting: Cardiology

## 2016-08-15 DIAGNOSIS — I350 Nonrheumatic aortic (valve) stenosis: Secondary | ICD-10-CM

## 2016-08-20 DIAGNOSIS — H9201 Otalgia, right ear: Secondary | ICD-10-CM | POA: Diagnosis not present

## 2016-08-20 DIAGNOSIS — H6121 Impacted cerumen, right ear: Secondary | ICD-10-CM | POA: Diagnosis not present

## 2016-08-26 ENCOUNTER — Other Ambulatory Visit: Payer: Self-pay | Admitting: Internal Medicine

## 2016-08-27 ENCOUNTER — Other Ambulatory Visit: Payer: Self-pay | Admitting: Internal Medicine

## 2016-08-27 DIAGNOSIS — I10 Essential (primary) hypertension: Secondary | ICD-10-CM | POA: Diagnosis not present

## 2016-08-27 DIAGNOSIS — I480 Paroxysmal atrial fibrillation: Secondary | ICD-10-CM | POA: Diagnosis not present

## 2016-08-27 DIAGNOSIS — I5032 Chronic diastolic (congestive) heart failure: Secondary | ICD-10-CM | POA: Diagnosis not present

## 2016-08-27 LAB — CBC WITH DIFFERENTIAL/PLATELET
Basophils Absolute: 64 cells/uL (ref 0–200)
Basophils Relative: 1 %
Eosinophils Absolute: 64 cells/uL (ref 15–500)
Eosinophils Relative: 1 %
HCT: 33.3 % — ABNORMAL LOW (ref 35.0–45.0)
Hemoglobin: 10.4 g/dL — ABNORMAL LOW (ref 11.7–15.5)
Lymphocytes Relative: 23 %
Lymphs Abs: 1472 cells/uL (ref 850–3900)
MCH: 27 pg (ref 27.0–33.0)
MCHC: 31.2 g/dL — ABNORMAL LOW (ref 32.0–36.0)
MCV: 86.5 fL (ref 80.0–100.0)
MPV: 10.4 fL (ref 7.5–12.5)
Monocytes Absolute: 512 cells/uL (ref 200–950)
Monocytes Relative: 8 %
Neutro Abs: 4288 cells/uL (ref 1500–7800)
Neutrophils Relative %: 67 %
Platelets: 253 10*3/uL (ref 140–400)
RBC: 3.85 MIL/uL (ref 3.80–5.10)
RDW: 16.4 % — ABNORMAL HIGH (ref 11.0–15.0)
WBC: 6.4 10*3/uL (ref 3.8–10.8)

## 2016-08-27 LAB — PROTIME-INR
INR: 1
Prothrombin Time: 10.6 s (ref 9.0–11.5)

## 2016-08-28 LAB — BASIC METABOLIC PANEL
BUN: 29 mg/dL — ABNORMAL HIGH (ref 7–25)
CO2: 30 mmol/L (ref 20–31)
Calcium: 9.3 mg/dL (ref 8.6–10.4)
Chloride: 99 mmol/L (ref 98–110)
Creat: 1.18 mg/dL — ABNORMAL HIGH (ref 0.60–0.93)
Glucose, Bld: 146 mg/dL — ABNORMAL HIGH (ref 65–99)
Potassium: 4.3 mmol/L (ref 3.5–5.3)
Sodium: 138 mmol/L (ref 135–146)

## 2016-09-02 ENCOUNTER — Ambulatory Visit (HOSPITAL_COMMUNITY)
Admission: RE | Admit: 2016-09-02 | Discharge: 2016-09-02 | Disposition: A | Discharge status: Home / Self Care | Payer: Medicare Other | Source: Ambulatory Visit | Attending: Cardiology | Admitting: Cardiology

## 2016-09-02 ENCOUNTER — Encounter (HOSPITAL_COMMUNITY)
Admission: RE | Disposition: A | Discharge status: Home / Self Care | Payer: Self-pay | Source: Ambulatory Visit | Attending: Cardiology

## 2016-09-02 DIAGNOSIS — Z96611 Presence of right artificial shoulder joint: Secondary | ICD-10-CM | POA: Diagnosis not present

## 2016-09-02 DIAGNOSIS — Z7984 Long term (current) use of oral hypoglycemic drugs: Secondary | ICD-10-CM | POA: Insufficient documentation

## 2016-09-02 DIAGNOSIS — E1159 Type 2 diabetes mellitus with other circulatory complications: Secondary | ICD-10-CM | POA: Diagnosis present

## 2016-09-02 DIAGNOSIS — E1122 Type 2 diabetes mellitus with diabetic chronic kidney disease: Secondary | ICD-10-CM | POA: Diagnosis not present

## 2016-09-02 DIAGNOSIS — M1990 Unspecified osteoarthritis, unspecified site: Secondary | ICD-10-CM | POA: Diagnosis not present

## 2016-09-02 DIAGNOSIS — E119 Type 2 diabetes mellitus without complications: Secondary | ICD-10-CM

## 2016-09-02 DIAGNOSIS — E039 Hypothyroidism, unspecified: Secondary | ICD-10-CM | POA: Diagnosis not present

## 2016-09-02 DIAGNOSIS — E669 Obesity, unspecified: Secondary | ICD-10-CM | POA: Insufficient documentation

## 2016-09-02 DIAGNOSIS — Z7951 Long term (current) use of inhaled steroids: Secondary | ICD-10-CM | POA: Insufficient documentation

## 2016-09-02 DIAGNOSIS — D509 Iron deficiency anemia, unspecified: Secondary | ICD-10-CM | POA: Diagnosis not present

## 2016-09-02 DIAGNOSIS — Z87891 Personal history of nicotine dependence: Secondary | ICD-10-CM | POA: Insufficient documentation

## 2016-09-02 DIAGNOSIS — I2510 Atherosclerotic heart disease of native coronary artery without angina pectoris: Secondary | ICD-10-CM | POA: Diagnosis not present

## 2016-09-02 DIAGNOSIS — Z8249 Family history of ischemic heart disease and other diseases of the circulatory system: Secondary | ICD-10-CM | POA: Diagnosis not present

## 2016-09-02 DIAGNOSIS — N183 Chronic kidney disease, stage 3 (moderate): Secondary | ICD-10-CM | POA: Diagnosis not present

## 2016-09-02 DIAGNOSIS — I272 Pulmonary hypertension, unspecified: Secondary | ICD-10-CM | POA: Insufficient documentation

## 2016-09-02 DIAGNOSIS — E785 Hyperlipidemia, unspecified: Secondary | ICD-10-CM | POA: Diagnosis present

## 2016-09-02 DIAGNOSIS — I350 Nonrheumatic aortic (valve) stenosis: Secondary | ICD-10-CM

## 2016-09-02 DIAGNOSIS — I5032 Chronic diastolic (congestive) heart failure: Secondary | ICD-10-CM | POA: Insufficient documentation

## 2016-09-02 DIAGNOSIS — I152 Hypertension secondary to endocrine disorders: Secondary | ICD-10-CM | POA: Diagnosis present

## 2016-09-02 DIAGNOSIS — Z7901 Long term (current) use of anticoagulants: Secondary | ICD-10-CM | POA: Insufficient documentation

## 2016-09-02 DIAGNOSIS — I5033 Acute on chronic diastolic (congestive) heart failure: Secondary | ICD-10-CM | POA: Diagnosis present

## 2016-09-02 DIAGNOSIS — I4891 Unspecified atrial fibrillation: Secondary | ICD-10-CM | POA: Insufficient documentation

## 2016-09-02 DIAGNOSIS — Z79899 Other long term (current) drug therapy: Secondary | ICD-10-CM | POA: Insufficient documentation

## 2016-09-02 DIAGNOSIS — J449 Chronic obstructive pulmonary disease, unspecified: Secondary | ICD-10-CM | POA: Insufficient documentation

## 2016-09-02 DIAGNOSIS — I10 Essential (primary) hypertension: Secondary | ICD-10-CM | POA: Diagnosis present

## 2016-09-02 DIAGNOSIS — I130 Hypertensive heart and chronic kidney disease with heart failure and stage 1 through stage 4 chronic kidney disease, or unspecified chronic kidney disease: Secondary | ICD-10-CM | POA: Insufficient documentation

## 2016-09-02 HISTORY — PX: CARDIAC CATHETERIZATION: SHX172

## 2016-09-02 LAB — POCT I-STAT 3, ART BLOOD GAS (G3+)
Bicarbonate: 25.8 mmol/L (ref 20.0–28.0)
O2 Saturation: 93 %
TCO2: 27 mmol/L (ref 0–100)
pCO2 arterial: 43.7 mmHg (ref 32.0–48.0)
pH, Arterial: 7.38 (ref 7.350–7.450)
pO2, Arterial: 69 mmHg — ABNORMAL LOW (ref 83.0–108.0)

## 2016-09-02 LAB — POCT I-STAT 3, VENOUS BLOOD GAS (G3P V)
Acid-Base Excess: 3 mmol/L — ABNORMAL HIGH (ref 0.0–2.0)
Bicarbonate: 28.3 mmol/L — ABNORMAL HIGH (ref 20.0–28.0)
O2 Saturation: 69 %
TCO2: 30 mmol/L (ref 0–100)
pCO2, Ven: 46.5 mmHg (ref 44.0–60.0)
pH, Ven: 7.392 (ref 7.250–7.430)
pO2, Ven: 37 mmHg (ref 32.0–45.0)

## 2016-09-02 LAB — GLUCOSE, CAPILLARY
Glucose-Capillary: 146 mg/dL — ABNORMAL HIGH (ref 65–99)
Glucose-Capillary: 146 mg/dL — ABNORMAL HIGH (ref 65–99)

## 2016-09-02 SURGERY — RIGHT/LEFT HEART CATH AND CORONARY ANGIOGRAPHY

## 2016-09-02 MED ORDER — MIDAZOLAM HCL 2 MG/2ML IJ SOLN
INTRAMUSCULAR | Status: DC | PRN
  Administered 2016-09-02: 2 mg via INTRAVENOUS

## 2016-09-02 MED ORDER — METFORMIN HCL 500 MG PO TABS
500.0000 mg | ORAL_TABLET | Freq: Two times a day (BID) | ORAL | Status: DC
  Filled 2016-09-02: qty 1

## 2016-09-02 MED ORDER — SODIUM CHLORIDE 0.9% FLUSH: 3.0000 mL | Freq: Two times a day (BID) | INTRAVENOUS | Status: DC

## 2016-09-02 MED ORDER — FENTANYL CITRATE (PF) 100 MCG/2ML IJ SOLN
INTRAMUSCULAR | Status: AC
  Filled 2016-09-02: qty 2

## 2016-09-02 MED ORDER — SODIUM CHLORIDE 0.9% FLUSH: 3.0000 mL | INTRAVENOUS | Status: DC | PRN

## 2016-09-02 MED ORDER — HEPARIN (PORCINE) IN NACL 2-0.9 UNIT/ML-% IJ SOLN
INTRAMUSCULAR | Status: AC
  Filled 2016-09-02: qty 1000

## 2016-09-02 MED ORDER — SODIUM CHLORIDE 0.9 % IV SOLN: 250.0000 mL | INTRAVENOUS | Status: DC | PRN

## 2016-09-02 MED ORDER — ASPIRIN 81 MG PO CHEW
81.0000 mg | CHEWABLE_TABLET | ORAL | Status: AC
  Administered 2016-09-02: 81 mg via ORAL

## 2016-09-02 MED ORDER — ASPIRIN 81 MG PO CHEW
CHEWABLE_TABLET | ORAL | Status: AC
  Administered 2016-09-02: 81 mg via ORAL
  Filled 2016-09-02: qty 1

## 2016-09-02 MED ORDER — ACETAMINOPHEN 325 MG PO TABS
650.0000 mg | ORAL_TABLET | Freq: Once | ORAL | Status: AC
Start: 2016-09-02 — End: 2016-09-02
  Administered 2016-09-02: 650 mg via ORAL

## 2016-09-02 MED ORDER — SODIUM CHLORIDE 0.9 % WEIGHT BASED INFUSION: 1.0000 mL/kg/h | INTRAVENOUS | Status: AC

## 2016-09-02 MED ORDER — LIDOCAINE HCL (PF) 1 % IJ SOLN
INTRAMUSCULAR | Status: AC
  Filled 2016-09-02: qty 30

## 2016-09-02 MED ORDER — IOPAMIDOL (ISOVUE-370) INJECTION 76%
INTRAVENOUS | Status: AC
  Filled 2016-09-02: qty 100

## 2016-09-02 MED ORDER — LIDOCAINE HCL (PF) 1 % IJ SOLN
INTRAMUSCULAR | Status: DC | PRN
  Administered 2016-09-02: 17 mL

## 2016-09-02 MED ORDER — ACETAMINOPHEN 325 MG PO TABS
ORAL_TABLET | ORAL | Status: AC
  Filled 2016-09-02: qty 2

## 2016-09-02 MED ORDER — HEPARIN (PORCINE) IN NACL 2-0.9 UNIT/ML-% IJ SOLN
INTRAMUSCULAR | Status: DC | PRN
Start: 2016-09-02 — End: 2016-09-02
  Administered 2016-09-02: 1000 mL

## 2016-09-02 MED ORDER — SODIUM CHLORIDE 0.9 % IV SOLN
INTRAVENOUS | Status: DC
  Administered 2016-09-02: 08:00:00 via INTRAVENOUS

## 2016-09-02 MED ORDER — IOPAMIDOL (ISOVUE-370) INJECTION 76%
INTRAVENOUS | Status: DC | PRN
  Administered 2016-09-02: 50 mL via INTRA_ARTERIAL

## 2016-09-02 MED ORDER — MIDAZOLAM HCL 2 MG/2ML IJ SOLN
INTRAMUSCULAR | Status: AC
Start: 2016-09-02 — End: 2016-09-02
  Filled 2016-09-02: qty 2

## 2016-09-02 MED ORDER — FENTANYL CITRATE (PF) 100 MCG/2ML IJ SOLN
INTRAMUSCULAR | Status: DC | PRN
  Administered 2016-09-02: 25 ug via INTRAVENOUS

## 2016-09-02 SURGICAL SUPPLY — 11 items
CATH INFINITI 5FR MULTPACK ANG (CATHETERS) ×2 IMPLANT
CATH SWAN GANZ 7F STRAIGHT (CATHETERS) ×2 IMPLANT
GUIDEWIRE 3MM J TIP .035 145 (WIRE) ×2 IMPLANT
HOVERMATT SINGLE USE (MISCELLANEOUS) ×2 IMPLANT
KIT HEART LEFT (KITS) ×2 IMPLANT
PACK CARDIAC CATHETERIZATION (CUSTOM PROCEDURE TRAY) ×2 IMPLANT
SHEATH PINNACLE 5F 10CM (SHEATH) ×2 IMPLANT
SHEATH PINNACLE 7F 10CM (SHEATH) ×2 IMPLANT
TRANSDUCER W/STOPCOCK (MISCELLANEOUS) ×4 IMPLANT
WIRE EMERALD 3MM-J .025X260CM (WIRE) ×2 IMPLANT
WIRE EMERALD ST .035X150CM (WIRE) ×2 IMPLANT

## 2016-09-02 NOTE — Progress Notes (Signed)
8f venous sheath aspirated and removed from rfv. Manual pressure applied for 10 minutes, then 5 fr arterial sheath aspirated and removed from rfa, manual pressure applied to both sites for 20 additional minutes. Groin level 0 Distal pulses present.  Bilateral DP and Pt pulses present and easily palpable.  Bedrest instructions given.  Bedrest begins at 11:25:00

## 2016-09-02 NOTE — H&P (View-Only) (Signed)
Cardiology Office Note   Date:  08/07/2016   ID:  Patricia [name], DOB 08/11/1936, MRN [id number]  PCP:  Jeanmarie Hubert, MD  Cardiologist:   Kripa Foskey Martinique, MD   Chief Complaint  Patient presents with  . Follow-up  . Edema    left leg more than right leg       History of Present Illness: Patricia [name] is a 80 y.o. female who presents for follow up atrial fibrillation and  aortic stenosis. She has a history of HTN, obesity, DM, HL, and anemia. She was admitted to the hospital in December 2016 with increased dyspnea. She was found to be in atrial fibrillation with RVR. She had pulmonary edema. Hgb was 10. Echo showed EF 55-60% with moderate to severe AS. She was anticoagulated with Xarelto and started on amiodarone. She had TEE guided DCCV with return to NSR. She was seen back in the office on 11/20/15 and was still in NSR.  On 12/18/15 she underwent right shoulder arthroplasty. Post op Hgb was 9.6. It subsequently dropped to 7.1. She was transfused. She has been on iron. Hgb improved to 10. She did have EGD and colonoscopy with both gastric and colonic polyps. No bleeding seen.   Most recent Echo in July as noted below.  On follow up today she states she is doing fair. 6 weeks ago she noted an episode of significant SOB and wheezing. She called the paramedics. She was in NSR. She was given a breathing treatment with relief. She does note occasional SOB with exertion. She has some left foot swelling. No chest pain or dizziness.     Past Medical History:  Diagnosis Date  . Allergy   . Anemia, unspecified   . Arthritis   . Asthma   . Atrial fibrillation status post cardioversion [facility]) 09/2015   s/p TEE/DCCV>>SR on amio  . Benign neoplasm of colon   . Carpal tunnel syndrome   . Cellulitis and abscess of finger, unspecified   . Cerumen impaction   . Cervicalgia   . Chronic airway obstruction, not elsewhere classified   . Chronic anticoagulation 09/2015   Xarelto for afib,  CHADS2VASC=5  . Diabetes mellitus without complication ([place])   . Diarrhea   . Diffuse cystic mastopathy   . Dysrhythmia   . Edema   . Encounter for long-term (current) use of other medications   . Heart murmur   . Lumbago   . Neck pain 05/23/2015  . Obesity, unspecified   . Other and unspecified hyperlipidemia   . Other psoriasis   . Pain in joint, lower leg   . PONV (postoperative nausea and vomiting)   . Primary localized osteoarthrosis of right shoulder 12/18/2015  . Routine gynecological examination   . Scoliosis (and kyphoscoliosis), idiopathic   . Severe aortic stenosis   . Shortness of breath dyspnea    with exertion  . Type II or unspecified type diabetes mellitus without mention of complication, uncontrolled   . Unspecified essential hypertension   . Unspecified hemorrhoids without mention of complication   . Unspecified hypothyroidism   . Urge incontinence     Past Surgical History:  Procedure Laterality Date  . ABDOMINAL HYSTERECTOMY  1987   complete  . BREAST SURGERY     right breast 3 surgeries [phone]  . CARDIOVERSION N/A 10/19/2015   Procedure: CARDIOVERSION;  Surgeon: Lelon Perla, MD;  Location: [place];  Service: Cardiovascular;  Laterality: N/A;  . CHOLECYSTECTOMY  1992  DR Deon Pilling  . COLON SURGERY     2004,2007,2013.  3 times colon surgieres  . EXCISE LE MANDIBULAR LYMPH NODE T     DR C. NEWMAN  . FOOT SURGERY  1989  . LEFT SHOULDER ARTHROSCOPY    . 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 NEUROPLASTY / TRANSPOSITION MEDIAN NERVE AT CARPAL TUNNEL BILATERAL  05/2003  . TEE WITHOUT CARDIOVERSION N/A 10/19/2015   Procedure: Transesophageal Echocardiogram (TEE) ;  Surgeon: Lelon Perla, MD;  Location: [facility] ENDOSCOPY;  Service: Cardiovascular;  Laterality: N/A;  . TOTAL SHOULDER ARTHROPLASTY Right 12/18/2015   Procedure: TOTAL SHOULDER ARTHROPLASTY;  Surgeon: [name], MD;  Location: [employer];  Service: Orthopedics;  Laterality: Right;  .  TRIGGER FINGER RELEASE Left   . VAGINAL CYST REMOVED  1967     Current Outpatient Prescriptions  Medication Sig Dispense Refill  . ADVAIR DISKUS 100-50 MCG/DOSE AEPB Inhale 1 inhalation two  times daily for breathing 180 each 3  . albuterol (PROVENTIL HFA;VENTOLIN HFA) 108 (90 BASE) MCG/ACT inhaler Inhale 2 puffs into the lungs every 6 (six) hours as needed for wheezing or shortness of breath. 1 Inhaler 0  . amiodarone (PACERONE) 200 MG tablet Take one tablet daily by mouth 90 tablet 3  . cetirizine (ZYRTEC) 10 MG tablet Take 10 mg by mouth daily as needed for allergies.     . cholecalciferol (VITAMIN D) 1000 units tablet One daily for vitamin D supplement 100 tablet 4  . COMFORT LANCETS MISC Use daily to get blood for glucometer 100 each 3  . diltiazem (CARDIZEM CD) 180 MG 24 hr capsule Take 1 capsule by mouth  once a day to control blood pressure 90 capsule 3  . doxazosin (CARDURA) 4 MG tablet Take one tablet by mouth once daily to help control blood pressure 90 tablet 3  . ferrous sulfate 325 (65 FE) MG EC tablet Take 1 tablet (325 mg total) by mouth daily with breakfast. 30 tablet 6  . furosemide (LASIX) 40 MG tablet Take 40 mg by mouth daily.    Marland Kitchen glimepiride (AMARYL) 2 MG tablet Take 1 tablet by mouth at  breakfast to control  Glucose 90 tablet 0  . glucose blood (BAYER CONTOUR TEST) test strip Use daily too check glucose 100 each 4  . levothyroxine (SYNTHROID, LEVOTHROID) 50 MCG tablet Take 1 tablet by mouth  daily for thyroid  supplement 90 tablet 1  . metFORMIN (GLUCOPHAGE) 500 MG tablet TAKE ONE TABLET BEFORE  BREAKFAST AND ONE TABLET  BEFORE SUPPER TO CONTROL  BLOOD SUGARS 180 tablet 3  . mirabegron ER (MYRBETRIQ) 25 MG TB24 tablet Take 1 tablet (25 mg total) by mouth daily. 30 tablet   . pantoprazole (PROTONIX) 40 MG tablet Take 1 tablet (40 mg total) by mouth daily. 90 tablet 1  . Potassium 95 MG TABS Take 1 tablet by mouth 3 (three) times a week. Pt only takes when taking LASIX     . pravastatin (PRAVACHOL) 20 MG tablet Take 1 tablet by mouth daily.    Marland Kitchen triamcinolone (KENALOG) 0.025 % cream Apply 1 application topically as directed.     No current facility-administered medications for this visit.     Allergies:   Codeine and Vesicare [solifenacin]    Social History:  The patient  reports that she quit smoking about 26 years ago. She has never used smokeless tobacco. She reports that she does not drink alcohol or use drugs.   Family History:  The patient's family  history includes Arthritis in her sister; Asthma in her sister; Diabetes in her father; Heart disease in her brother and father; Liver cancer in her sister; Stroke in her father.    ROS:  Please see the history of present illness.   Otherwise, review of systems are positive for none.   All other systems are reviewed and negative.    PHYSICAL EXAM: VS:  BP 136/62   Pulse 66   Ht _0  (1.6 m)   Wt 221 lb 6.4 oz (100.4 kg)   BMI 39.22 kg/m  , BMI Body mass index is 39.22 kg/m. GEN: Well nourished, obese, in no acute distress  HEENT: normal  Neck: no JVD, carotid bruits, or masses Cardiac: RRR; harsh gr 3/6 systolic murmur RUSB>> apex and back. No gallop  Respiratory:  clear to auscultation bilaterally, normal work of breathing GI: soft, nontender, nondistended, + BS MS: no deformity or atrophy  Skin: warm and dry, no rash. Trace LE edema.  Neuro:  Strength and sensation are intact Psych: euthymic mood, full affect   EKG:  EKG is not ordered today.  Recent Labs: Lab Results  Component Value Date   WBC 6.5 04/28/2016   HGB 10.0 (L) 04/07/2016   HCT 33.1 (L) 04/28/2016   PLT 278 04/28/2016   GLUCOSE 119 (H) 04/28/2016   CHOL 182 04/28/2016   TRIG 174 (H) 04/28/2016   HDL 61 04/28/2016   LDLCALC 86 04/28/2016   ALT 20 04/28/2016   AST 19 04/28/2016   NA 139 04/28/2016   K 4.2 04/28/2016   CL 95 (L) 04/28/2016   CREATININE 1.27 (H) 04/28/2016   BUN 27 04/28/2016   CO2 27 04/28/2016     TSH 6.030 (H) 04/28/2016   INR 1.14 10/19/2015   HGBA1C 6.8 (H) 04/28/2016      Lipid Panel    Component Value Date/Time   CHOL 182 04/28/2016 0940   TRIG 174 (H) 04/28/2016 0940   HDL 61 04/28/2016 0940   CHOLHDL 3.0 04/28/2016 0940   LDLCALC 86 04/28/2016 0940      Wt Readings from Last 3 Encounters:  08/07/16 221 lb 6.4 oz (100.4 kg)  07/21/16 219 lb 3.2 oz (99.4 kg)  05/13/16 218 lb (98.9 kg)      Other studies Reviewed: Additional studies/ records that were reviewed today include: Echo 04/30/16:Study Conclusions  - Left ventricle: The cavity size was normal. There was moderate   concentric hypertrophy. Systolic function was normal. The   estimated ejection fraction was in the range of 55% to 60%. Wall   motion was normal; there were no regional wall motion   abnormalities. Features are consistent with a pseudonormal left   ventricular filling pattern, with concomitant abnormal relaxation   and increased filling pressure (grade 2 diastolic dysfunction). - Aortic valve: There was moderate to severe stenosis. Mean   gradient (S): 40 mm Hg. Peak gradient (S): 65 mm Hg. Valve area   (VTI): 1.07 cm^2. Valve area (Vmax): 1 cm^2. Valve area (Vmean):   0.94 cm^2. - Mitral valve: Calcified annulus. Mildly thickened leaflets .   There was mild regurgitation. Valve area by continuity equation   (using LVOT flow): 1.88 cm^2. - Left atrium: The atrium was moderately to severely dilated. - Pulmonary arteries: Systolic pressure was mildly increased. PA   peak pressure: 39 mm Hg (S).    ASSESSMENT AND PLAN:  1.  Atrial fibrillation. S/p DCCV in December. Maintaining NSR on amiodarone.  We again discussed anticoagulation.  I am concerned about her risk of bleeding with prior heme + stool and significant anemia. No clear cut source on GI evaluation but polyps noted. I would favor holding off on anticoagulation for now.  If she should have recurrent Afib we would need to  reconsider.   2.  Severe Aortic stenosis. Echo in July showed progression with velocity > 4 cm/sec and mean gradient of 40 mm Hg. Discussed that she may need to be considered for AV surgery in the near future. I think we need to start planning for valve replacement in the next 6 months. The next step would be to perform a right and left heart cath. She is going to discuss with her family and we will coordinate a convenient time to schedule this with me. The procedure and risks were reviewed including but not limited to death, myocardial infarction, stroke, arrythmias, bleeding, transfusion, emergency surgery, dye allergy, or renal dysfunction. The patient voices understanding and is agreeable to proceed..  We also discussed options for AVR including open heart surgery vs TAVR. If open heart surgery planned would consider concomitant MAZE procedure and LA ligation. Once cardiac cath performed will refer to CT surgery/valve clinic for recommendations.   3. Anemia Fe deficient. Last Hgb 10. Will hold Xarelto for now. Will repeat lab work prior to cardiac cath.   4. Chronic kidney disease stage 3.   5. S/p right shoulder arthroplasty.   6. Chronic diastolic CHF. Not currently volume overloaded.  7. HTN  8. DM type 2.     Current medicines are reviewed at length with the patient today.  The patient does not have concerns regarding medicines.  The following changes have been made:  See above  Labs/ tests ordered today include:   No orders of the defined types were placed in this encounter.    Disposition:   Will arrange [facility] when convenient for patient.   Signed, Searcy Miyoshi Martinique, MD  08/07/2016 10:55 AM    [facility] [address] [place], [place], [address] Phone [phone], Fax [phone]

## 2016-09-02 NOTE — Discharge Instructions (Signed)
HOLD METFORMIN FOR 48 HOURS. MAY RESUME ON 09/05/16 FRI.  Groin Surgical Site Care Refer to this sheet in the next few weeks. These instructions provide you with information about caring for yourself after your procedure. Your health care provider may also give you more specific instructions. Your treatment has been planned according to current medical practices, but problems sometimes occur. Call your health care provider if you have any problems or questions after your procedure. WHAT TO EXPECT AFTER THE PROCEDURE After your procedure, it is typical to have the following:  Bruising at the groin site that usually fades within 1-2 weeks.  Blood collecting in the tissue (hematoma) that may be painful to the touch. It should usually decrease in size and tenderness within 1-2 weeks. HOME CARE INSTRUCTIONS  Take medicines only as directed by your health care provider.  You may shower 24-48 hours after the procedure or as directed by your health care provider. Remove the bandage (dressing) and gently wash the site with plain soap and water. Pat the area dry with a clean towel. Do not rub the site, because this may cause bleeding.  Do not take baths, swim, or use a hot tub until your health care provider approves.  Check your insertion site every day for redness, swelling, or drainage.  Do not apply powder or lotion to the site.  Limit use of stairs to twice a day for the first 2-3 days or as directed by your health care provider.  Do not squat for the first 2-3 days or as directed by your health care provider.  Do not lift over 10 lb (4.5 kg) for 5 days after your procedure or as directed by your health care provider.  Ask your health care provider when it is okay to:  Return to work or school.  Resume usual physical activities or sports.  Resume sexual activity.  Do not drive home if you are discharged the same day as the procedure. Have someone else drive you.  You may drive 24  hours after the procedure unless otherwise instructed by your health care provider.  Do not operate machinery or power tools for 24 hours after the procedure or as directed by your health care provider.  If your procedure was done as an outpatient procedure, which means that you went home the same day as your procedure, a responsible adult should be with you for the first 24 hours after you arrive home.  Keep all follow-up visits as directed by your health care provider. This is important. SEEK MEDICAL CARE IF:  You have a fever.  You have chills.  You have increased bleeding from the groin site. Hold pressure on the site. SEEK IMMEDIATE MEDICAL CARE IF:  You have unusual pain at the groin site.  You have redness, warmth, or swelling at the groin site.  You have drainage (other than a small amount of blood on the dressing) from the groin site.  The groin site is bleeding, and the bleeding does not stop after 30 minutes of holding steady pressure on the site.  Your leg or foot becomes pale, cool, tingly, or numb.   This information is not intended to replace advice given to you by your health care provider. Make sure you discuss any questions you have with your health care provider.   Document Released: 06/16/2014 Document Reviewed: 06/16/2014 Elsevier Interactive Patient Education [employer].

## 2016-09-02 NOTE — Progress Notes (Signed)
Difficult IV insertion, 3 unsuccessful IV attempts by 2 RNs. Anderson Malta, RN aware that we were unable to obtain antecubital access. Okay to proceed with 1 IV per Anderson Malta.

## 2016-09-02 NOTE — Interval H&P Note (Signed)
History and Physical Interval Note:  09/02/2016 9:33 AM  Patricia [name]  has presented today for surgery, with the diagnosis of pre op clearance  The various methods of treatment have been discussed with the patient and family. After consideration of risks, benefits and other options for treatment, the patient has consented to  Procedure(s): Right/Left Heart Cath and Coronary Angiography (N/A) as a surgical intervention .  The patient's history has been reviewed, patient examined, no change in status, stable for surgery.  I have reviewed the patient's chart and labs.  Questions were answered to the patient's satisfaction.     Collier [facility] 09/02/2016 9:33 AM

## 2016-09-03 ENCOUNTER — Encounter (HOSPITAL_COMMUNITY): Payer: Self-pay | Admitting: Cardiology

## 2016-09-04 ENCOUNTER — Telehealth: Payer: Self-pay | Admitting: Cardiology

## 2016-09-04 NOTE — Telephone Encounter (Signed)
Returned call to patient.She stated she had 2 different directions for metformin.Advised restart metformin 2 days after cath, 09/05/16.

## 2016-09-04 NOTE — Telephone Encounter (Signed)
Pt said she had procedure on Tuesday,she has some questions about the information she received when she was discharged.

## 2016-09-09 ENCOUNTER — Institutional Professional Consult (permissible substitution) (INDEPENDENT_AMBULATORY_CARE_PROVIDER_SITE_OTHER): Payer: Medicare Other | Admitting: Surgery

## 2016-09-09 ENCOUNTER — Other Ambulatory Visit: Payer: Self-pay | Admitting: *Deleted

## 2016-09-09 ENCOUNTER — Encounter: Payer: Self-pay | Admitting: Surgery

## 2016-09-09 VITALS — BP 158/64 | HR 67 | Resp 18 | Ht 63.0 in | Wt 222.0 lb

## 2016-09-09 DIAGNOSIS — I350 Nonrheumatic aortic (valve) stenosis: Secondary | ICD-10-CM

## 2016-09-09 DIAGNOSIS — I2510 Atherosclerotic heart disease of native coronary artery without angina pectoris: Secondary | ICD-10-CM | POA: Diagnosis not present

## 2016-09-09 DIAGNOSIS — I480 Paroxysmal atrial fibrillation: Secondary | ICD-10-CM

## 2016-09-09 NOTE — Progress Notes (Signed)
Patient ID: Patricia [name], female   DOB: Jul 22, 1936, 80 y.o.   MRN: [id number]  [place] SURGERY CONSULTATION REPORT  Referring Provider is Martinique, [name] M, MD PCP is Jeanmarie Hubert, MD  Chief Complaint  Patient presents with  . Coronary Artery Disease    eval for CABG/MAZE.Marland KitchenCATH 09/02/16, ECHO 04/30/16  . Atrial Fibrillation    HPI:  The patient is a 80 year old woman with a history of diabetes, hypertension, hyperlipidemia, anemia, atrial fibrillation and aortic stenosis who was referred for evaluation of surgical treatment of her aortic stenosis. She has a long history of a heart murmur but presented in 09/2015 with increased shortness of breath and was found to be in atrial fibrillation with RVR and pulmonary edema. She was anemic at the time with a hgb of 10. An echo showed an EF of 55-60% with moderate to severe AS with a mean AV gradient of 31 mm Hg with a peak velocity ratio of 0.36 and an indexed valve area of 0.56 cm2/m2. She was started on Xarelto and amiodarone and underwent a TEE directed cardioversion with return to NSR. She was followed up on 11/20/2015 and was still in sinus rhythm and the Xarelto was stopped due to concerns about the potential for GI bleeding with her anemia and pending right shoulder surgery. She underwent right shoulder replacement by Dr. Mardelle Matte on 12/18/2015. Her Hbg dropped to 7.1 postop requiring transfusion. She has been on iron since and her hgb has settled out at about 10. She subsequently had an EGD and colonoscopy showing gastric and colonic polyps but no sign of bleeding. She has been followed by Dr. Martinique and underwent a repeat echo in July 2017 showing an increase in the mean gradient to 40 mm Hg with a peak velocity ratio of 0.33 and an indexed valve area of 0.44 cm2/m2. Her LVEF remains 55-60% with grade 2 diastolic dysfunction. She was seen back for follow up by Dr. Martinique last month  and had continued to have shortness of breath and fatigue with exertion. She has had some dizziness. She reports some edema in her ankles and feet which has been more chronic. She underwent cath on 09/02/2016 which showed a 75% stenosis at the origin of a large OM3 but no there significant stenoses. LVEDP was elevated at 27 mm Hg. There was moderate pulmonary hypertension at 52/23. The mean AV gradient was 41.4 mm Hg.   Past Medical History:  Diagnosis Date  . Allergy   . Anemia, unspecified   . Arthritis   . Asthma   . Atrial fibrillation status post cardioversion [facility]) 09/2015   s/p TEE/DCCV>>SR on amio  . Benign neoplasm of colon   . Carpal tunnel syndrome   . Cellulitis and abscess of finger, unspecified   . Cerumen impaction   . Cervicalgia   . Chronic airway obstruction, not elsewhere classified   . Chronic anticoagulation 09/2015   Xarelto for afib, CHADS2VASC=5  . Diabetes mellitus without complication ([place])   . Diarrhea   . Diffuse cystic mastopathy   . Dysrhythmia   . Edema   . Encounter for long-term (current) use of other medications   . Heart murmur   . Lumbago   . Neck pain 05/23/2015  . Obesity, unspecified   . Other and unspecified hyperlipidemia   . Other psoriasis   . Pain in joint, lower leg   . PONV (postoperative nausea and vomiting)   .  Primary localized osteoarthrosis of right shoulder 12/18/2015  . Routine gynecological examination   . Scoliosis (and kyphoscoliosis), idiopathic   . Severe aortic stenosis   . Shortness of breath dyspnea    with exertion  . Type II or unspecified type diabetes mellitus without mention of complication, uncontrolled   . Unspecified essential hypertension   . Unspecified hemorrhoids without mention of complication   . Unspecified hypothyroidism   . Urge incontinence     Past Surgical History:  Procedure Laterality Date  . ABDOMINAL HYSTERECTOMY  1987   complete  . BREAST SURGERY     right breast 3 surgeries  [id number]  . CARDIAC CATHETERIZATION N/A 09/02/2016   Procedure: Right/Left Heart Cath and Coronary Angiography;  Surgeon: [name] M Martinique, MD;  Location: [place] [facility];  Service: Cardiovascular;  Laterality: N/A;  . CARDIOVERSION N/A 10/19/2015   Procedure: CARDIOVERSION;  Surgeon: Lelon Perla, MD;  Location: [place];  Service: Cardiovascular;  Laterality: N/A;  . [place]  . COLON SURGERY     2004,2007,2013.  3 times colon surgieres  . EXCISE LE MANDIBULAR LYMPH NODE T     DR C. NEWMAN  . FOOT SURGERY  1989  . LEFT SHOULDER ARTHROSCOPY    . 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 NEUROPLASTY / TRANSPOSITION MEDIAN NERVE AT CARPAL TUNNEL BILATERAL  05/2003  . TEE WITHOUT CARDIOVERSION N/A 10/19/2015   Procedure: Transesophageal Echocardiogram (TEE) ;  Surgeon: Lelon Perla, MD;  Location: [facility];  Service: Cardiovascular;  Laterality: N/A;  . TOTAL SHOULDER ARTHROPLASTY Right 12/18/2015   Procedure: TOTAL SHOULDER ARTHROPLASTY;  Surgeon: [name], MD;  Location: [place];  Service: Orthopedics;  Laterality: Right;  . TRIGGER FINGER RELEASE Left   . VAGINAL CYST REMOVED  1967    Family History  Problem Relation Age of Onset  . Diabetes Father   . Stroke Father   . Heart disease Father   . Liver cancer Sister   . Heart disease Brother   . Asthma Sister   . Arthritis Sister     Social History   Social History  . Marital status: Divorced    Spouse name: N/A  . Number of children: 1  . Years of education: N/A   Occupational History  . Retired Market researcher for [employer] History Main Topics  . Smoking status: Former Smoker    Quit date: 09/07/1989  . Smokeless tobacco: Never Used  . Alcohol use No  . Drug use: No  . Sexual activity: No   Other Topics Concern  . Not on file   Social History Narrative   Her daughter & 3 grandchildren live in the area.      Current Outpatient Prescriptions    Medication Sig Dispense Refill  . acetaminophen (TYLENOL) 500 MG tablet Take 500 mg by mouth every 6 (six) hours as needed for mild pain.    Marland Kitchen ADVAIR DISKUS 100-50 MCG/DOSE AEPB Inhale 1 inhalation two  times daily for breathing 180 each 3  . albuterol (PROVENTIL HFA;VENTOLIN HFA) 108 (90 BASE) MCG/ACT inhaler Inhale 2 puffs into the lungs every 6 (six) hours as needed for wheezing or shortness of breath. 1 Inhaler 0  . amiodarone (PACERONE) 200 MG tablet Take one tablet daily by mouth (Patient taking differently: Take 200 mg by mouth daily. Take one tablet daily by mouth) 90 tablet 3  . cetirizine (ZYRTEC) 10 MG tablet Take 10 mg by  mouth daily as needed for allergies.     . cholecalciferol (VITAMIN D) 1000 units tablet One daily for vitamin D supplement (Patient taking differently: Take 1,000 Units by mouth at bedtime. One daily for vitamin D supplement) 100 tablet 4  . COMFORT LANCETS MISC Use daily to get blood for glucometer 100 each 3  . diltiazem (CARDIZEM CD) 180 MG 24 hr capsule Take 1 capsule by mouth  once a day to control blood pressure (Patient taking differently: Take 180 mg by mouth daily. To control blood pressure) 90 capsule 3  . doxazosin (CARDURA) 4 MG tablet TAKE 1 TABLET BY MOUTH ONCE DAILY TO HELP CONTROL BLOOD PRESSURE (Patient taking differently: TAKE 1 TABLET BY MOUTH ONCE AT BEDTIME TO HELP CONTROL BLOOD PRESSURE) 90 tablet 0  . ferrous sulfate 325 (65 FE) MG EC tablet Take 1 tablet (325 mg total) by mouth daily with breakfast. 30 tablet 6  . furosemide (LASIX) 40 MG tablet Take 40 mg by mouth every other day.     Marland Kitchen glimepiride (AMARYL) 2 MG tablet Take 1 tablet by mouth at  breakfast to control  Glucose (Patient taking differently: Take 1 tablet by mouth at supper to control glucose) 90 tablet 0  . glucose blood (BAYER CONTOUR TEST) test strip Use daily too check glucose 100 each 4  . levothyroxine (SYNTHROID, LEVOTHROID) 50 MCG tablet Take 1 tablet by mouth  daily for  thyroid  supplement (Patient taking differently: Take 1 tablet by mouth at supper for thyroid supplement) 90 tablet 1  . metFORMIN (GLUCOPHAGE) 500 MG tablet Take 500 mg by mouth 2 (two) times daily with a meal.    . mirabegron ER (MYRBETRIQ) 25 MG TB24 tablet Take 1 tablet (25 mg total) by mouth daily. 30 tablet   . pantoprazole (PROTONIX) 40 MG tablet Take 1 tablet (40 mg total) by mouth daily. 90 tablet 1  . potassium gluconate 595 (99 K) MG TABS tablet Take 595 mg by mouth every other day. Takes with furosemide    . pravastatin (PRAVACHOL) 20 MG tablet TAKE 1 TABLET BY MOUTH  DAILY (Patient taking differently: Take 20 mg by mouth daily with supper. ) 90 tablet 1  . triamcinolone (KENALOG) 0.025 % cream Apply 1 application topically daily as needed (psoriasis).      No current facility-administered medications for this visit.     Allergies  Allergen Reactions  . Codeine Other (See Comments)    "spaces her out, dizziness, can't walk well"  . Vesicare [Solifenacin] Itching      Review of Systems:   General:  normal appetite, decreased energy, no weight gain, no weight loss, no fever  Cardiac:  no chest pain with exertion, no chest pain at rest, moderate SOB with mild exertion, no resting SOB, no PND, no orthopnea, no palpitations, no arrhythmia, history of  atrial fibrillation, has LE edema, has dizzy spells, no syncope  Respiratory:  exertional shortness of breath, no home oxygen, no productive cough, no dry cough, no bronchitis, has wheezing, no hemoptysis, has asthma, no pain with inspiration or cough, no sleep apnea, no CPAP at night  GI:   no difficulty swallowing, no reflux, no frequent heartburn, no hiatal hernia, no abdominal pain, no constipation, no diarrhea, no hematochezia, no hematemesis, no melena  GU:   no dysuria,  no frequency, no urinary tract infection, no hematuria,  no kidney stones, no kidney disease  Vascular:  no pain suggestive of claudication, no pain in feet, no  leg cramps, no varicose veins, no DVT, no non-healing foot ulcer  Neuro:   no stroke, no TIA's, no seizures, no headaches, no temporary blindness one eye,  no slurred speech, no peripheral neuropathy, no chronic pain, no instability of gait, no memory/cognitive dysfunction  Musculoskeletal:  severe bilateral knee arthritis, knee joint swelling, no myalgias, some difficulty walking, reduced mobility   Skin:   no rash, no itching, no skin infections, no pressure sores or ulcerations  Psych:   no anxiety, no depression, no nervousness, no unusual recent stress  Eyes:   no blurry vision, no floaters, no recent vision changes,  wears glasses or contacts  ENT:   no hearing loss, no loose or painful teeth, partial dentures, last saw dentist years ago, some broken teeth  Hematologic:  has easy bruising, no abnormal bleeding, no clotting disorder, no frequent epistaxis  Endocrine:  has diabetes, does check CBG's at home           Physical Exam:   BP (!) 158/64 (BP Location: Right Arm, Patient Position: Sitting, Cuff Size: Large)   Pulse 67   Resp 18   Ht _0  (1.6 m)   Wt 222 lb (100.7 kg)   SpO2 91% Comment: ON RA  BMI 39.33 kg/m   General:  Elderly, obese but  well-appearing  HEENT:  Unremarkable , NCAT, PERLA, EOMI, oropharynx clear, teeth in poor condition  Neck:   no JVD, no bruits, no adenopathy or thyromegaly  Chest:   clear to auscultation, symmetrical breath sounds, no wheezes, no rhonchi   CV:   RRR, grade III/VI crescendo/decrescendo murmur heard best at RSB,  no diastolic murmur  Abdomen:  soft, non-tender, no masses or organomegaly  Extremities:  warm, well-perfused, pulses diminished in feet, mild bilateral LE edema  Rectal/GU  Deferred  Neuro:   Grossly non-focal and symmetrical throughout  Skin:   Clean and dry, no rashes, no breakdown   Diagnostic Tests:    [facility]                        [address], [place] [address]                             [phone]  ------------------------------------------------------------------- Transthoracic Echocardiography  Patient:    Patricia [name], Patricia [name] MR #:       [id number] Study Date: 04/30/2016 Gender:     F Age:        58 Height:     160 cm Weight:     96.6 kg BSA:        2.12 m^2 Pt. Status: Room:   ORDERING     [name] Martinique, M.D.  REFERRING    [name] Martinique, M.D.  ATTENDING    Sanda Klein, MD  SONOGRAPHER  Marygrace Drought, RCS  PERFORMING   [facility], Outpatient  cc:  ------------------------------------------------------------------- LV EF: 55% -   60%  ------------------------------------------------------------------- Indications:      Aortic Stenosis (I35.0).  ------------------------------------------------------------------- History:   PMH:   Atrial fibrillation.  Risk factors: Hypertension. Diabetes mellitus.  ------------------------------------------------------------------- Study Conclusions  - Left ventricle: The cavity size was normal. There was moderate   concentric hypertrophy. Systolic function was normal. The   estimated ejection fraction was in the range  of 55% to 60%. Wall   motion was normal; there were no regional wall motion   abnormalities. Features are consistent with a pseudonormal left   ventricular filling pattern, with concomitant abnormal relaxation   and increased filling pressure (grade 2 diastolic dysfunction). - Aortic valve: There was moderate to severe stenosis. Mean   gradient (S): 40 mm Hg. Peak gradient (S): 65 mm Hg. Valve area   (VTI): 1.07 cm^2. Valve area (Vmax): 1 cm^2. Valve area (Vmean):   0.94 cm^2. - Mitral valve: Calcified annulus. Mildly thickened leaflets .   There was mild regurgitation. Valve area by continuity equation   (using LVOT flow): 1.88 cm^2. - Left atrium: The atrium was moderately to severely dilated. - Pulmonary arteries: Systolic pressure was mildly increased. PA   peak  pressure: 39 mm Hg (S).  Transthoracic echocardiography.  M-mode, complete 2D, spectral Doppler, and color Doppler.  Birthdate:  Patient birthdate: [birth year].  Age:  Patient is 80 yr old.  Sex:  Gender: female. BMI: 37.7 kg/m^2.  Blood pressure:     196/79  Patient status: Outpatient.  Study date:  Study date: 04/30/2016. Study time: 10:05 AM.  Location:  [facility]  -------------------------------------------------------------------  ------------------------------------------------------------------- Left ventricle:  The cavity size was normal. There was moderate concentric hypertrophy. Systolic function was normal. The estimated ejection fraction was in the range of 55% to 60%. Wall motion was normal; there were no regional wall motion abnormalities. Features are consistent with a pseudonormal left ventricular filling pattern, with concomitant abnormal relaxation and increased filling pressure (grade 2 diastolic dysfunction).  ------------------------------------------------------------------- Aortic valve:   Moderately thickened, moderately calcified leaflets.  Doppler:   There was moderate to severe stenosis. VTI ratio of LVOT to aortic valve: 0.33. Valve area (VTI): 1.07 cm^2. Indexed valve area (VTI): 0.5 cm^2/m^2. Peak velocity ratio of LVOT to aortic valve: 0.31. Valve area (Vmax): 1 cm^2. Indexed valve area (Vmax): 0.47 cm^2/m^2. Mean velocity ratio of LVOT to aortic valve: 0.29. Valve area (Vmean): 0.94 cm^2. Indexed valve area (Vmean): 0.44 cm^2/m^2.    Mean gradient (S): 40 mm Hg. Peak gradient (S): 65 mm Hg.  ------------------------------------------------------------------- Aorta:  Aortic root: The aortic root was normal in size. Ascending aorta: The ascending aorta was normal in size.  ------------------------------------------------------------------- Mitral valve:   Calcified annulus. Mildly thickened leaflets . Leaflet separation was normal.   Doppler:  Transvalvular velocity was within the normal range. There was no evidence for stenosis. There was mild regurgitation.    Valve area by pressure half-time: 3.28 cm^2. Indexed valve area by pressure half-time: 1.55 cm^2/m^2. Valve area by continuity equation (using LVOT flow): 1.88 cm^2. Indexed valve area by continuity equation (using LVOT flow): 0.89 cm^2/m^2.    Mean gradient (D): 4 mm Hg. Peak gradient (D): 8 mm Hg.  ------------------------------------------------------------------- Left atrium:  The atrium was moderately to severely dilated.   ------------------------------------------------------------------- Right ventricle:  The cavity size was normal. Systolic function was normal.  ------------------------------------------------------------------- Pulmonic valve:   Poorly visualized.  The valve appears to be grossly normal.  ------------------------------------------------------------------- Pulmonary artery:   Systolic pressure was mildly increased.  ------------------------------------------------------------------- Right atrium:  The atrium was normal in size.  ------------------------------------------------------------------- Pericardium:  There was no pericardial effusion.  ------------------------------------------------------------------- Systemic veins: Inferior vena cava: The vessel was normal in size. The respirophasic diameter changes were in the normal range (= 50%), consistent with normal central venous pressure. Diameter: 20 mm.  ------------------------------------------------------------------- Measurements   IVC  Value           Reference  ID                                       20     mm       ---------    Left ventricle                           Value           Reference  LV ID, ED, PLAX chordal                  47.8   mm       43 - 52  LV ID, ES, PLAX chordal                  30.2   mm        23 - 38  LV fx shortening, PLAX chordal           37     %        >=29  LV PW thickness, ED                      15.8   mm       ---------  IVS/LV PW ratio, ED                      1.07            <=1.3  Stroke volume, 2D                        77     ml       ---------  Stroke volume/bsa, 2D                    36     ml/m^2   ---------  LV ejection fraction, 1-p A4C            54     %        ---------  LV e&', lateral                           5.48   cm/s     ---------  LV E/e&', lateral                         25.91           ---------  LV e&', medial                            7.35   cm/s     ---------  LV E/e&', medial                          19.32           ---------  LV e&', average                           6.42   cm/s     ---------  LV E/e&', average  22.14           ---------    Ventricular septum                       Value           Reference  IVS thickness, ED                        16.9   mm       ---------    LVOT                                     Value           Reference  LVOT ID, S                               20.3   mm       ---------  LVOT area                                3.24   cm^2     ---------  LVOT peak velocity, S                    119.94 cm/s     ---------  LVOT mean velocity, S                    87.2   cm/s     ---------  LVOT VTI, S                              34.2   cm       ---------  LVOT peak gradient, S                    6      mm Hg    ---------  Stroke volume (SV), LVOT DP              110.7  ml       ---------  Stroke index (SV/bsa), LVOT DP           52.2   ml/m^2   ---------    Aortic valve                             Value           Reference  Aortic valve peak velocity, S            404    cm/s     ---------  Aortic valve mean velocity, S            301    cm/s     ---------  Aortic valve VTI, S                      106    cm       ---------  Aortic mean gradient, S                  40     mm Hg    ---------   Aortic peak gradient,  S                  65     mm Hg    ---------  VTI ratio, LVOT/AV                       0.33            ---------  Aortic valve area, VTI                   1.07   cm^2     ---------  Aortic valve area/bsa, VTI               0.5    cm^2/m^2 ---------  Velocity ratio, peak, LVOT/AV            0.31            ---------  Aortic valve area, peak velocity         1      cm^2     ---------  Aortic valve area/bsa, peak              0.47   cm^2/m^2 ---------  velocity  Velocity ratio, mean, LVOT/AV            0.29            ---------  Aortic valve area, mean velocity         0.94   cm^2     ---------  Aortic valve area/bsa, mean              0.44   cm^2/m^2 ---------  velocity  Aortic regurg pressure half-time         147    ms       ---------    Aorta                                    Value           Reference  Aortic root ID, ED                       24     mm       ---------    Left atrium                              Value           Reference  LA ID, A-P, ES                           49     mm       ---------  LA ID/bsa, A-P                   (H)     2.31   cm/m^2   <=2.2  LA volume, S                             96     ml       ---------  LA volume/bsa, S                         45.3   ml/m^2   ---------  LA volume, ES, 1-p A4C                   75     ml       ---------  LA volume/bsa, ES, 1-p A4C               35.4   ml/m^2   ---------  LA volume, ES, 1-p A2C                   113    ml       ---------  LA volume/bsa, ES, 1-p A2C               53.3   ml/m^2   ---------    Mitral valve                             Value           Reference  Mitral E-wave peak velocity              142    cm/s     ---------  Mitral A-wave peak velocity              117    cm/s     ---------  Mitral mean velocity, D                  91.4   cm/s     ---------  Mitral deceleration time                 225    ms       150 - 230  Mitral pressure half-time                49     ms        ---------  Mitral mean gradient, D                  4      mm Hg    ---------  Mitral peak gradient, D                  8      mm Hg    ---------  Mitral E/A ratio, peak                   1.2             ---------  Mitral valve area, PHT, DP               3.28   cm^2     ---------  Mitral valve area/bsa, PHT, DP           1.55   cm^2/m^2 ---------  Mitral valve area, LVOT                  1.88   cm^2     ---------  continuity  Mitral valve area/bsa, LVOT              0.89   cm^2/m^2 ---------  continuity  Mitral annulus VTI, D                    40.9   cm       ---------    Pulmonary arteries  Value           Reference  PA pressure, S, DP               (H)     39     mm Hg    <=30    Tricuspid valve                          Value           Reference  Tricuspid regurg peak velocity           302    cm/s     ---------  Tricuspid peak RV-RA gradient            36     mm Hg    ---------  Tricuspid maximal regurg                 302    cm/s     ---------  velocity, PISA    Systemic veins                           Value           Reference  Estimated CVP                            3      mm Hg    ---------    Right ventricle                          Value           Reference  RV pressure, S, DP               (H)     39     mm Hg    <=30  Legend: (L)  and  (H)  mark values outside specified reference range.  ------------------------------------------------------------------- Prepared and Electronically Authenticated by  Sanda Klein, MD 2017-07-05T13:43:25  Physicians   Panel Physicians Referring Physician Case Authorizing Physician  [name] M Martinique, MD (Primary)    Procedures   Right/Left Heart Cath and Coronary Angiography  Conclusion     LV end diastolic pressure is moderately elevated.  There is severe aortic valve stenosis.  There is no mitral valve stenosis.  Prox LAD to Mid LAD lesion, 20 %stenosed.  Ost 3rd Mrg to 3rd Mrg lesion, 75  %stenosed.  Ost 2nd Mrg to 2nd Mrg lesion, 30 %stenosed.  Ost RCA to Dist RCA lesion, 10 %stenosed.  Hemodynamic findings consistent with mild pulmonary hypertension.   1. Single vessel obstructive CAD involving OM3 2. Moderate to severe aortic stenosis. Mean gradient 41 mm Hg, valve index 0.7 3. Mild pulmonary HTN 4. Elevated LV filling pressures. Prominent V waves on PCWP tracings c/w decreased LV compliance.  5. Normal cardiac output.   Plan: recommend consideration for AVR/ single vessel CABG.    Indications   Nonrheumatic aortic valve stenosis [I35.0 (ICD-10-CM)]  Procedural Details/Technique   Technical Details Indication: 80 yo WF with mod-severe aortic stenosis, progressive. Now with symptoms of CHF.  Procedural Details: The right groin was prepped, draped, and anesthetized with 1% lidocaine. Using the modified Seldinger technique a 5 Fr sheath was placed in the right femoral artery and a 7French sheath was placed in the right femoral vein. A Swan-Ganz catheter was  used for the right heart catheterization. Standard protocol was followed for recording of right heart pressures and sampling of oxygen saturations. Fick cardiac output was calculated. Both right and left pulmonary wedge pressures were recorded and were equal. Standard Judkins catheters were used for selective coronary angiography and left ventricular pressures. There were no immediate procedural complications. The patient was transferred to the post catheterization recovery area for further monitoring.  Contrast: 50 cc   Estimated blood loss <50 mL.  During this procedure the patient was administered the following to achieve and maintain moderate conscious sedation: Versed 2 mg, Fentanyl 25 mcg, while the patient's heart rate, blood pressure, and oxygen saturation were continuously monitored. The period of conscious sedation was 43 minutes, of which I was present face-to-face 100% of this time.    Complications    Complications documented before study signed (09/02/2016 10:47 AM EST)    No complications were associated with this study.  Documented by [name] M Martinique, MD - 09/02/2016 10:47 AM EST    Coronary Findings   Dominance: Right  Left Anterior Descending  Prox LAD to Mid LAD lesion, 20% stenosed. The lesion is moderately calcified.  First Diagonal Branch  Vessel is large in size.  Second Diagonal Branch  Vessel is small in size.  Left Circumflex  Second Obtuse Marginal Branch  Ost 2nd Mrg to 2nd Mrg lesion, 30% stenosed.  Third Obtuse Marginal Branch  Ost 3rd Mrg to 3rd Mrg lesion, 75% stenosed.  Right Coronary Artery  Ost RCA to Dist RCA lesion, 10% stenosed.  Right Heart   Right Heart Pressures Hemodynamic findings consistent with mild pulmonary hypertension. Elevated LV EDP consistent with volume overload. Prominent V waves    Left Heart   Left Ventricle LV end diastolic pressure is moderately elevated.    Mitral Valve There is no mitral valve stenosis.    Aortic Valve There is severe aortic valve stenosis. The aortic valve is calcified.    Coronary Diagrams   Diagnostic Diagram     Implants     No implant documentation for this case.  PACS Images   Show images for Cardiac catheterization   Link to Procedure Log   Procedure Log    Hemo Data   Flowsheet Row Most Recent Value  Fick Cardiac Output 7.76 L/min  Fick Cardiac Output Index 3.92 (L/min)/BSA  Aortic Mean Gradient 41.4 mmHg  Aortic Peak Gradient 45 mmHg  Aortic Valve Area 1.46  Aortic Value Area Index 0.74 cm2/BSA  RA A Wave 18 mmHg  RA V Wave 14 mmHg  RA Mean 14 mmHg  RV Systolic Pressure 65 mmHg  RV Diastolic Pressure 11 mmHg  RV EDP 22 mmHg  PA Systolic Pressure 52 mmHg  PA Diastolic Pressure 23 mmHg  PA Mean 37 mmHg  PW A Wave 26 mmHg  PW V Wave 40 mmHg  PW Mean 26 mmHg  AO Systolic Pressure 721 mmHg  AO Diastolic Pressure 64 mmHg  AO Mean 587 mmHg  LV Systolic Pressure 276 mmHg  LV  Diastolic Pressure 15 mmHg  LV EDP 27 mmHg  Arterial Occlusion Pressure Extended Systolic Pressure 184 mmHg  Arterial Occlusion Pressure Extended Diastolic Pressure 66 mmHg  Arterial Occlusion Pressure Extended Mean Pressure 110 mmHg  Left Ventricular Apex Extended Systolic Pressure 859 mmHg  Left Ventricular Apex Extended Diastolic Pressure 19 mmHg  Left Ventricular Apex Extended EDP Pressure 33 mmHg  QP/QS 1  TPVR Index 9.45 HRUI  TSVR Index 27.06 HRUI  PVR SVR  Ratio 0.12  TPVR/TSVR Ratio 0.35    Impression:  This 79 year old obese diabetic woman has stage D severe symptomatic aortic stenosis with progressive exertional fatigue and shortness of breath, NYHA class II. She has a history of atrial fibrillation but has been maintaining sinus rhythm on amiodarone since DCCV last December. Cardiac cath shows a 75% stenosis in a large OM3 that is probably not significant at this time with a normal LV and she is not having any chest pain. I have personally reviewed and interpreted her echo and cath. Her aortic valve leaflets are moderately thickened and calcified with restricted leaflet motion and a mean AV gradient of 40 mm Hg consistent with severe AS.  She has a relatively small aortic annulus and root. I think AVR is indicated in this patient. Her coronary disease is probably not significant at this time. 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 In addition she has a small aortic annulus and root which may require an annular enlargement procedure or root replacement. I think TAVR may be a reasonable alternative since her coronary disease is limited and probably not significant at this time and she has been maintaining sinus rhythm since DCCV last year. Her operative risk would be much lower with TAVR and  her recovery time much shorter.   The patient was counseled at length regarding treatment alternatives for management of severe symptomatic aortic stenosis. Alternative approaches such as conventional aortic valve replacement, transcatheter aortic valve replacement, and palliative medical therapy were compared and contrasted at length. The risks associated with conventional surgical aortic valve replacement were been discussed in detail, as were expectations for post-operative convalescence. Long-term prognosis with medical therapy was discussed.  She is really not interested in open surgery and would like to pursue TAVR evaluation. We will initiate the CT scans for workup and she will require dental evaluation.    Plan:  1. Schedule gated cardiac CT and CTA of the chest, abdomen and pelvis  2. Dental evaluation  3. Second surgical evaluation by Dr. Roxy Manns after the CT scans have been completed.  4. She will require PT evaluation and PFT's.   I spent 60 minutes performing this consultation and > 50% of this time was spent face to face counseling and coordinating the care of this patient's severe aortic stenosis.  Gaye Pollack, MD 09/09/2016

## 2016-09-11 ENCOUNTER — Encounter (HOSPITAL_COMMUNITY): Payer: Self-pay | Admitting: Emergency Medicine

## 2016-09-11 ENCOUNTER — Emergency Department (HOSPITAL_COMMUNITY): Payer: Medicare Other

## 2016-09-11 ENCOUNTER — Inpatient Hospital Stay (HOSPITAL_COMMUNITY)
Admission: EM | Admit: 2016-09-11 | Discharge: 2016-09-14 | DRG: 291 | Disposition: A | Discharge status: Home / Self Care | Payer: Medicare Other | Attending: Nephrology | Admitting: Nephrology

## 2016-09-11 ENCOUNTER — Telehealth (HOSPITAL_COMMUNITY): Payer: Self-pay | Admitting: Dentistry

## 2016-09-11 DIAGNOSIS — Z833 Family history of diabetes mellitus: Secondary | ICD-10-CM | POA: Diagnosis not present

## 2016-09-11 DIAGNOSIS — R0602 Shortness of breath: Secondary | ICD-10-CM | POA: Diagnosis not present

## 2016-09-11 DIAGNOSIS — I248 Other forms of acute ischemic heart disease: Secondary | ICD-10-CM | POA: Diagnosis present

## 2016-09-11 DIAGNOSIS — I5032 Chronic diastolic (congestive) heart failure: Secondary | ICD-10-CM

## 2016-09-11 DIAGNOSIS — E1159 Type 2 diabetes mellitus with other circulatory complications: Secondary | ICD-10-CM | POA: Diagnosis present

## 2016-09-11 DIAGNOSIS — I481 Persistent atrial fibrillation: Secondary | ICD-10-CM | POA: Diagnosis present

## 2016-09-11 DIAGNOSIS — I350 Nonrheumatic aortic (valve) stenosis: Secondary | ICD-10-CM | POA: Diagnosis present

## 2016-09-11 DIAGNOSIS — I2510 Atherosclerotic heart disease of native coronary artery without angina pectoris: Secondary | ICD-10-CM | POA: Diagnosis present

## 2016-09-11 DIAGNOSIS — I130 Hypertensive heart and chronic kidney disease with heart failure and stage 1 through stage 4 chronic kidney disease, or unspecified chronic kidney disease: Principal | ICD-10-CM | POA: Diagnosis present

## 2016-09-11 DIAGNOSIS — I160 Hypertensive urgency: Secondary | ICD-10-CM | POA: Diagnosis present

## 2016-09-11 DIAGNOSIS — D649 Anemia, unspecified: Secondary | ICD-10-CM | POA: Diagnosis not present

## 2016-09-11 DIAGNOSIS — I5033 Acute on chronic diastolic (congestive) heart failure: Secondary | ICD-10-CM | POA: Diagnosis not present

## 2016-09-11 DIAGNOSIS — I272 Pulmonary hypertension, unspecified: Secondary | ICD-10-CM | POA: Diagnosis present

## 2016-09-11 DIAGNOSIS — Z888 Allergy status to other drugs, medicaments and biological substances status: Secondary | ICD-10-CM

## 2016-09-11 DIAGNOSIS — E039 Hypothyroidism, unspecified: Secondary | ICD-10-CM | POA: Diagnosis present

## 2016-09-11 DIAGNOSIS — K219 Gastro-esophageal reflux disease without esophagitis: Secondary | ICD-10-CM | POA: Diagnosis present

## 2016-09-11 DIAGNOSIS — N183 Chronic kidney disease, stage 3 (moderate): Secondary | ICD-10-CM | POA: Diagnosis present

## 2016-09-11 DIAGNOSIS — J9621 Acute and chronic respiratory failure with hypoxia: Secondary | ICD-10-CM | POA: Diagnosis present

## 2016-09-11 DIAGNOSIS — F0390 Unspecified dementia without behavioral disturbance: Secondary | ICD-10-CM | POA: Diagnosis present

## 2016-09-11 DIAGNOSIS — I10 Essential (primary) hypertension: Secondary | ICD-10-CM | POA: Diagnosis not present

## 2016-09-11 DIAGNOSIS — Z79899 Other long term (current) drug therapy: Secondary | ICD-10-CM

## 2016-09-11 DIAGNOSIS — E119 Type 2 diabetes mellitus without complications: Secondary | ICD-10-CM

## 2016-09-11 DIAGNOSIS — Z01810 Encounter for preprocedural cardiovascular examination: Secondary | ICD-10-CM | POA: Diagnosis not present

## 2016-09-11 DIAGNOSIS — Z885 Allergy status to narcotic agent status: Secondary | ICD-10-CM

## 2016-09-11 DIAGNOSIS — Z96611 Presence of right artificial shoulder joint: Secondary | ICD-10-CM | POA: Diagnosis present

## 2016-09-11 DIAGNOSIS — J9601 Acute respiratory failure with hypoxia: Secondary | ICD-10-CM | POA: Diagnosis not present

## 2016-09-11 DIAGNOSIS — Z23 Encounter for immunization: Secondary | ICD-10-CM

## 2016-09-11 DIAGNOSIS — R079 Chest pain, unspecified: Secondary | ICD-10-CM | POA: Diagnosis not present

## 2016-09-11 DIAGNOSIS — J449 Chronic obstructive pulmonary disease, unspecified: Secondary | ICD-10-CM | POA: Diagnosis not present

## 2016-09-11 DIAGNOSIS — E785 Hyperlipidemia, unspecified: Secondary | ICD-10-CM | POA: Diagnosis present

## 2016-09-11 DIAGNOSIS — Z87891 Personal history of nicotine dependence: Secondary | ICD-10-CM

## 2016-09-11 DIAGNOSIS — R069 Unspecified abnormalities of breathing: Secondary | ICD-10-CM | POA: Diagnosis not present

## 2016-09-11 DIAGNOSIS — Z8249 Family history of ischemic heart disease and other diseases of the circulatory system: Secondary | ICD-10-CM | POA: Diagnosis not present

## 2016-09-11 DIAGNOSIS — J9600 Acute respiratory failure, unspecified whether with hypoxia or hypercapnia: Secondary | ICD-10-CM | POA: Diagnosis present

## 2016-09-11 DIAGNOSIS — E1122 Type 2 diabetes mellitus with diabetic chronic kidney disease: Secondary | ICD-10-CM | POA: Diagnosis present

## 2016-09-11 DIAGNOSIS — R0902 Hypoxemia: Secondary | ICD-10-CM | POA: Insufficient documentation

## 2016-09-11 DIAGNOSIS — I152 Hypertension secondary to endocrine disorders: Secondary | ICD-10-CM | POA: Diagnosis present

## 2016-09-11 DIAGNOSIS — E7800 Pure hypercholesterolemia, unspecified: Secondary | ICD-10-CM | POA: Diagnosis not present

## 2016-09-11 HISTORY — DX: Gastro-esophageal reflux disease without esophagitis: K21.9

## 2016-09-11 LAB — BRAIN NATRIURETIC PEPTIDE: B Natriuretic Peptide: 396.9 pg/mL — ABNORMAL HIGH (ref 0.0–100.0)

## 2016-09-11 LAB — BASIC METABOLIC PANEL
Anion gap: 10 (ref 5–15)
BUN: 18 mg/dL (ref 6–20)
CO2: 26 mmol/L (ref 22–32)
Calcium: 9.5 mg/dL (ref 8.9–10.3)
Chloride: 105 mmol/L (ref 101–111)
Creatinine, Ser: 1.13 mg/dL — ABNORMAL HIGH (ref 0.44–1.00)
GFR calc Af Amer: 53 mL/min — ABNORMAL LOW (ref >60)
GFR calc non Af Amer: 45 mL/min — ABNORMAL LOW (ref >60)
Glucose, Bld: 181 mg/dL — ABNORMAL HIGH (ref 65–99)
Potassium: 4.8 mmol/L (ref 3.5–5.1)
Sodium: 141 mmol/L (ref 135–145)

## 2016-09-11 LAB — I-STAT TROPONIN, ED
Troponin i, poc: 0.07 ng/mL (ref 0.00–0.08)
Troponin i, poc: 0.04 ng/mL (ref 0.00–0.08)

## 2016-09-11 LAB — CBC
HCT: 31.7 % — ABNORMAL LOW (ref 36.0–46.0)
Hemoglobin: 10 g/dL — ABNORMAL LOW (ref 12.0–15.0)
MCH: 27.3 pg (ref 26.0–34.0)
MCHC: 31.5 g/dL (ref 30.0–36.0)
MCV: 86.6 fL (ref 78.0–100.0)
Platelets: 234 10*3/uL (ref 150–400)
RBC: 3.66 MIL/uL — ABNORMAL LOW (ref 3.87–5.11)
RDW: 15.9 % — ABNORMAL HIGH (ref 11.5–15.5)
WBC: 8.6 10*3/uL (ref 4.0–10.5)

## 2016-09-11 LAB — GLUCOSE, CAPILLARY: Glucose-Capillary: 216 mg/dL — ABNORMAL HIGH (ref 65–99)

## 2016-09-11 MED ORDER — INSULIN ASPART 100 UNIT/ML ~~LOC~~ SOLN
0.0000 [IU] | Freq: Three times a day (TID) | SUBCUTANEOUS | Status: DC
  Administered 2016-09-12 (×2): 5 [IU] via SUBCUTANEOUS
  Administered 2016-09-13 (×3): 2 [IU] via SUBCUTANEOUS
  Administered 2016-09-14: 3 [IU] via SUBCUTANEOUS
  Administered 2016-09-14: 2 [IU] via SUBCUTANEOUS

## 2016-09-11 MED ORDER — AMIODARONE HCL 200 MG PO TABS
200.0000 mg | ORAL_TABLET | Freq: Every day | ORAL | Status: DC
  Administered 2016-09-12 – 2016-09-14 (×3): 200 mg via ORAL
  Filled 2016-09-11 (×3): qty 1

## 2016-09-11 MED ORDER — FUROSEMIDE 10 MG/ML IJ SOLN
40.0000 mg | Freq: Once | INTRAMUSCULAR | Status: AC
  Administered 2016-09-11: 40 mg via INTRAVENOUS
  Filled 2016-09-11: qty 4

## 2016-09-11 MED ORDER — ONDANSETRON HCL 4 MG/2ML IJ SOLN: 4.0000 mg | Freq: Four times a day (QID) | INTRAMUSCULAR | Status: DC | PRN

## 2016-09-11 MED ORDER — ASPIRIN 81 MG PO CHEW
324.0000 mg | CHEWABLE_TABLET | Freq: Once | ORAL | Status: AC
Start: 2016-09-11 — End: 2016-09-11
  Administered 2016-09-11: 324 mg via ORAL
  Filled 2016-09-11: qty 4

## 2016-09-11 MED ORDER — ASPIRIN EC 81 MG PO TBEC
81.0000 mg | DELAYED_RELEASE_TABLET | Freq: Every day | ORAL | Status: DC
  Administered 2016-09-12 – 2016-09-14 (×3): 81 mg via ORAL
  Filled 2016-09-11 (×3): qty 1

## 2016-09-11 MED ORDER — ACETAMINOPHEN 325 MG PO TABS: 650.0000 mg | ORAL_TABLET | ORAL | Status: DC | PRN

## 2016-09-11 MED ORDER — ALBUTEROL SULFATE (2.5 MG/3ML) 0.083% IN NEBU
2.5000 mg | INHALATION_SOLUTION | RESPIRATORY_TRACT | Status: DC | PRN
Start: 2016-09-11 — End: 2016-09-14

## 2016-09-11 MED ORDER — SODIUM CHLORIDE 0.9 % IV SOLN: 250.0000 mL | INTRAVENOUS | Status: DC | PRN

## 2016-09-11 MED ORDER — ENOXAPARIN SODIUM 40 MG/0.4ML ~~LOC~~ SOLN
40.0000 mg | SUBCUTANEOUS | Status: DC
  Administered 2016-09-11 – 2016-09-13 (×3): 40 mg via SUBCUTANEOUS
  Filled 2016-09-11 (×3): qty 0.4

## 2016-09-11 MED ORDER — SODIUM CHLORIDE 0.9% FLUSH
3.0000 mL | Freq: Two times a day (BID) | INTRAVENOUS | Status: DC
  Administered 2016-09-11 – 2016-09-14 (×6): 3 mL via INTRAVENOUS

## 2016-09-11 MED ORDER — MOMETASONE FURO-FORMOTEROL FUM 100-5 MCG/ACT IN AERO
2.0000 | INHALATION_SPRAY | Freq: Two times a day (BID) | RESPIRATORY_TRACT | Status: DC
  Administered 2016-09-11 – 2016-09-14 (×6): 2 via RESPIRATORY_TRACT
  Filled 2016-09-11: qty 8.8

## 2016-09-11 MED ORDER — LEVOTHYROXINE SODIUM 50 MCG PO TABS
50.0000 ug | ORAL_TABLET | Freq: Every day | ORAL | Status: DC
  Administered 2016-09-12 – 2016-09-14 (×3): 50 ug via ORAL
  Filled 2016-09-11 (×3): qty 1

## 2016-09-11 MED ORDER — IPRATROPIUM-ALBUTEROL 0.5-2.5 (3) MG/3ML IN SOLN: 3.0000 mL | Freq: Four times a day (QID) | RESPIRATORY_TRACT | Status: DC

## 2016-09-11 MED ORDER — FERROUS SULFATE 325 (65 FE) MG PO TABS
325.0000 mg | ORAL_TABLET | Freq: Every day | ORAL | Status: DC
  Administered 2016-09-12 – 2016-09-14 (×3): 325 mg via ORAL
  Filled 2016-09-11 (×3): qty 1

## 2016-09-11 MED ORDER — DILTIAZEM HCL ER COATED BEADS 180 MG PO CP24
180.0000 mg | ORAL_CAPSULE | Freq: Every day | ORAL | Status: DC
  Administered 2016-09-12 – 2016-09-14 (×3): 180 mg via ORAL
  Filled 2016-09-11 (×3): qty 1

## 2016-09-11 MED ORDER — SODIUM CHLORIDE 0.9% FLUSH: 3.0000 mL | INTRAVENOUS | Status: DC | PRN

## 2016-09-11 MED ORDER — NITROGLYCERIN 0.4 MG SL SUBL: 0.4000 mg | SUBLINGUAL_TABLET | SUBLINGUAL | Status: DC | PRN

## 2016-09-11 MED ORDER — FUROSEMIDE 10 MG/ML IJ SOLN
40.0000 mg | Freq: Two times a day (BID) | INTRAMUSCULAR | Status: DC
  Administered 2016-09-12: 40 mg via INTRAVENOUS
  Filled 2016-09-11: qty 4

## 2016-09-11 MED ORDER — PANTOPRAZOLE SODIUM 40 MG PO TBEC
40.0000 mg | DELAYED_RELEASE_TABLET | Freq: Every day | ORAL | Status: DC
  Administered 2016-09-12 – 2016-09-14 (×3): 40 mg via ORAL
  Filled 2016-09-11 (×3): qty 1

## 2016-09-11 MED ORDER — DOXAZOSIN MESYLATE 2 MG PO TABS
4.0000 mg | ORAL_TABLET | Freq: Every day | ORAL | Status: DC
  Administered 2016-09-11 – 2016-09-13 (×3): 4 mg via ORAL
  Filled 2016-09-11 (×3): qty 2

## 2016-09-11 MED ORDER — PRAVASTATIN SODIUM 20 MG PO TABS
20.0000 mg | ORAL_TABLET | Freq: Every day | ORAL | Status: DC
  Administered 2016-09-12 – 2016-09-13 (×2): 20 mg via ORAL
  Filled 2016-09-11 (×2): qty 1

## 2016-09-11 NOTE — Consult Note (Signed)
Cardiology Consult    Patient ID: Patricia [name] MRN: [id number], DOB/AGE: September 07, 1936   Admit date: 09/11/2016 Date of Consult: 09/11/2016  Primary Physician: Jeanmarie Hubert, MD Reason for Consult: Karmen Bongo, MD Primary Cardiologist: [name] Martinique Requesting Provider: Karmen Bongo, MD   History of Present Illness    Patricia [name] is a 80 year old female with a significant past medical history of severe aortic stenosis undergoing evaluation for transcatheter aortic implantation, paroxysmal atrial fibrillation, single vessel coronary artery disease without prior interventions, pulmonary hypertension, hypertension, type II diabetes, and anemia who is here for shortness of breath.  Patricia [name] states that around 4 am yesterday morning she woke up feeling short of breath.  She took her nebulizer and did get some relief of shortness of breath initially.  Eventually, her shortness of breath did get worse to the point she decided to come to the hospital.  She denied any chest pain with her shortness of breath but does report she sometimes have an achiness in her chest if she takes a deep breath.  She did notice that her weight increased by 3 pounds on Tuesday for which she took Lasix but did not recheck her weight or take Lasix since then.  At baseline she becomes short of breath with more than normal activity levels, but feels her shortness of breath has gotten worse in the last couple of days.  She denies any worsening of her feet swelling that she has normally.  Patient denies palpitations, lightheadedness, dizziness, fainting, or feeling as if she is going to faint.    Past Medical History   Past Medical History:  Diagnosis Date  . Allergy   . Anemia, unspecified   . Arthritis   . Asthma   . Atrial fibrillation status post cardioversion [facility]) 09/2015   s/p TEE/DCCV>>SR on amio  . Benign neoplasm of colon   . Carpal tunnel syndrome   . Cellulitis and abscess of finger,  unspecified   . Cerumen impaction   . Cervicalgia   . Chronic airway obstruction, not elsewhere classified   . Chronic anticoagulation 09/2015   Xarelto for afib, CHADS2VASC=5  . Diarrhea   . Diffuse cystic mastopathy   . Dysrhythmia   . Edema   . Encounter for long-term (current) use of other medications   . GERD (gastroesophageal reflux disease)   . Heart murmur   . Lumbago   . Neck pain 05/23/2015  . Obesity, unspecified   . Other and unspecified hyperlipidemia   . Other psoriasis   . Pain in joint, lower leg   . PONV (postoperative nausea and vomiting)   . Primary localized osteoarthrosis of right shoulder 12/18/2015  . Routine gynecological examination   . Scoliosis (and kyphoscoliosis), idiopathic   . Severe aortic stenosis   . Shortness of breath dyspnea    with exertion  . Type II or unspecified type diabetes mellitus without mention of complication, uncontrolled   . Unspecified essential hypertension   . Unspecified hemorrhoids without mention of complication   . Unspecified hypothyroidism   . Urge incontinence     Past Surgical History:  Procedure Laterality Date  . ABDOMINAL HYSTERECTOMY  1987   complete  . BREAST SURGERY     right breast 3 surgeries [phone]  . CARDIAC CATHETERIZATION N/A 09/02/2016   Procedure: Right/Left Heart Cath and Coronary Angiography;  Surgeon: [name] M Martinique, MD;  Location: [place] [facility];  Service: Cardiovascular;  Laterality: N/A;  .  CARDIOVERSION N/A 10/19/2015   Procedure: CARDIOVERSION;  Surgeon: Lelon Perla, MD;  Location: [place];  Service: Cardiovascular;  Laterality: N/A;  . [place]  . COLON SURGERY     2004,2007,2013.  3 times colon surgieres  . EXCISE LE MANDIBULAR LYMPH NODE T     DR C. NEWMAN  . FOOT SURGERY  1989  . LEFT SHOULDER ARTHROSCOPY    . 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 NEUROPLASTY / TRANSPOSITION MEDIAN NERVE AT CARPAL TUNNEL BILATERAL  05/2003  . TEE WITHOUT  CARDIOVERSION N/A 10/19/2015   Procedure: Transesophageal Echocardiogram (TEE) ;  Surgeon: Lelon Perla, MD;  Location: [facility];  Service: Cardiovascular;  Laterality: N/A;  . TOTAL SHOULDER ARTHROPLASTY Right 12/18/2015   Procedure: TOTAL SHOULDER ARTHROPLASTY;  Surgeon: [name], MD;  Location: [facility];  Service: Orthopedics;  Laterality: Right;  . TRIGGER FINGER RELEASE Left   . VAGINAL CYST REMOVED  1967     Allergies  Allergies  Allergen Reactions  . Codeine Other (See Comments)    "spaces her out, dizziness, can't walk well"  . Vesicare [Solifenacin] Itching    Inpatient Medications    Meds ordered this encounter  Medications  . aspirin chewable tablet 324 mg  . DISCONTD: nitroGLYCERIN (NITROSTAT) SL tablet 0.4 mg  . albuterol (PROVENTIL) (2.5 MG/3ML) 0.083% nebulizer solution    Sig: Take 2.5 mg by nebulization every 6 (six) hours as needed for wheezing or shortness of breath.  . furosemide (LASIX) injection 40 mg  . albuterol (PROVENTIL) (2.5 MG/3ML) 0.083% nebulizer solution 2.5 mg  . doxazosin (CARDURA) tablet 4 mg  . pravastatin (PRAVACHOL) tablet 20 mg  . pantoprazole (PROTONIX) EC tablet 40 mg  . levothyroxine (SYNTHROID, LEVOTHROID) tablet 50 mcg    Take 1 tablet by mouth  daily for thyroid  supplement    . mometasone-formoterol (DULERA) 100-5 MCG/ACT inhaler 2 puff  . ferrous sulfate tablet 325 mg  . amiodarone (PACERONE) tablet 200 mg    Take one tablet daily by mouth    . diltiazem (CARDIZEM CD) 24 hr capsule 180 mg    OP MCE:YEMV 1 capsule by mouth  once a day to control blood pressure Patient taking differently: To control blood pressure    . sodium chloride flush (NS) 0.9 % injection 3 mL  . sodium chloride flush (NS) 0.9 % injection 3 mL  . 0.9 %  sodium chloride infusion  . aspirin EC tablet 81 mg  . acetaminophen (TYLENOL) tablet 650 mg  . ondansetron (ZOFRAN) injection 4 mg  . enoxaparin (LOVENOX) injection 40 mg  . furosemide (LASIX)  injection 40 mg  . insulin aspart (novoLOG) injection 0-15 Units    Order Specific Question:   Correction coverage:    Answer:   Moderate (average weight, post-op)    Order Specific Question:   CBG < 70:    Answer:   implement hypoglycemia protocol    Order Specific Question:   CBG 70 - 120:    Answer:   0 units    Order Specific Question:   CBG 121 - 150:    Answer:   2 units    Order Specific Question:   CBG 151 - 200:    Answer:   3 units    Order Specific Question:   CBG 201 - 250:    Answer:   5 units    Order Specific Question:   CBG 251 - 300:  Answer:   8 units    Order Specific Question:   CBG 301 - 350:    Answer:   11 units    Order Specific Question:   CBG 351 - 400:    Answer:   15 units    Order Specific Question:   CBG > 400    Answer:   call MD and obtain STAT lab verification  . ipratropium-albuterol (DUONEB) 0.5-2.5 (3) MG/3ML nebulizer solution 3 mL     Family History    Family History  Problem Relation Age of Onset  . Diabetes Father   . Stroke Father   . Heart disease Father   . Liver cancer Sister   . Heart disease Brother   . Asthma Sister   . Arthritis Sister     Social History    Social History   Social History  . Marital status: Divorced    Spouse name: N/A  . Number of children: 1  . Years of education: N/A   Occupational History  . Retired Market researcher for [employer] History Main Topics  . Smoking status: Former Smoker    Years: 40.00    Quit date: 09/07/1989  . Smokeless tobacco: Never Used  . Alcohol use No  . Drug use: No  . Sexual activity: No   Other Topics Concern  . Not on file   Social History Narrative   Her daughter & 3 grandchildren live in the area.       Review of Systems    All other systems reviewed and are otherwise negative except as noted above.  Physical Exam    Blood pressure (!) 133/48, pulse 87, temperature 98.2 F (36.8 C), resp. rate 21, SpO2 (!) 89 %.  General:  Pleasant, NAD Psych: Normal affect. Neuro: Alert and oriented X 3. Moves all extremities spontaneously. HEENT: Normal  Neck: Supple without bruits or JVD. Lungs:  Bilateral lower lung crackles, decreased breath sounds diffusely. Heart: RRR, no S1, soft S2. no s3 or s4.  4/6 late peaking systolic murmur that radiates to her neck.  No gallops or rubs.   Abdomen: Soft, non-tender, non-distended, BS + x 4.  Extremities: No clubbing, cyanosis or edema. DP/PT/Radials 2+ and equal bilaterally.  Labs    Troponin [facility] Test)  Recent Labs  09/11/16 1612  TROPIPOC 0.07   No results for input(s): CKTOTAL, CKMB, TROPONINI in the last 72 hours. Lab Results  Component Value Date   WBC 8.6 09/11/2016   HGB 10.0 (L) 09/11/2016   HCT 31.7 (L) 09/11/2016   MCV 86.6 09/11/2016   PLT 234 09/11/2016    Recent Labs Lab 09/11/16 1602  NA 141  K 4.8  CL 105  CO2 26  BUN 18  CREATININE 1.13*  CALCIUM 9.5  GLUCOSE 181*   Lab Results  Component Value Date   CHOL 182 04/28/2016   HDL 61 04/28/2016   LDLCALC 86 04/28/2016   TRIG 174 (H) 04/28/2016   No results found for: [facility]   Radiology Studies    Dg Chest 2 View  Result Date: 09/11/2016 CLINICAL DATA:  Chest pain EXAM: CHEST  2 VIEW COMPARISON:  10/18/2015 FINDINGS: Cardiac enlargement. Congestive heart failure with edema and small pleural effusions. Edema has progressed since prior study. Bibasilar atelectasis. IMPRESSION: Congestive heart failure with pulmonary edema and small pleural effusions. Electronically Signed   By: Franchot Gallo M.D.   On: 09/11/2016 15:14    EKG &  Cardiac Imaging    EKG: Sinus rhythm, PR prolongation, ST-depression with t-wave inversions in the lateral inferior leads (new from prior)  Echocardiogram: 04/30/16 - Left ventricle: The cavity size was normal. There was moderate   concentric hypertrophy. Systolic function was normal. The   estimated ejection fraction was in the range of 55% to 60%.  Wall   motion was normal; there were no regional wall motion   abnormalities. Features are consistent with a pseudonormal left   ventricular filling pattern, with concomitant abnormal relaxation   and increased filling pressure (grade 2 diastolic dysfunction). - Aortic valve: There was moderate to severe stenosis. Mean   gradient (S): 40 mm Hg. Peak gradient (S): 65 mm Hg. Valve area   (VTI): 1.07 cm^2. Valve area (Vmax): 1 cm^2. Valve area (Vmean):   0.94 cm^2. - Mitral valve: Calcified annulus. Mildly thickened leaflets .   There was mild regurgitation. Valve area by continuity equation   (using LVOT flow): 1.88 cm^2. - Left atrium: The atrium was moderately to severely dilated. - Pulmonary arteries: Systolic pressure was mildly increased. PA   peak pressure: 39 mm Hg (S).  Assessment & Plan    # Dyspnea: Based on examination and history, patient is likely suffering from acute heart failure syndrome in the setting of aortic stenosis.  Although troponin is elevated, likely secondary to type II myocardial infarction (demand ischemia) and not due to acute plaque rupture/embolis.  She has hemodynamic congestion on examination and chest x-ray shows pulmonary edema and effusion.  She states that her breathing has improved.   - Continue IV Lasix 40 mg/bid for now with a goal net negative 1-2 L daily while we monitor for euvolemia. - Daily weights - Hypertension control  # Troponin elevation: Likely type II myocardial infarction due to multifactorial etiology (hypertensive urgency, volume overload, aortic stenosis, with single vessel coronary artery disease).  Patient is without chest pain and shortness of breath is not likely anginal equivalent.  Patient feels better at this time. - Continue to trend troponin level. - Continue aspirin at this time. - Would not start full dose anticoagulation unless patient has symptoms concerning for myocardial infarction.    # Aortic stenosis: Patient with  known severe aortic stenosis currently undergoing work up for TAVR.  Dyspnea likely related to aortic stenosis.  At this time, blood pressure is substantially elevated.  She appears to be hemodynamically stable.  Unclear why she is on diltiazem in the setting of her aortic stenosis, but can keep at this time. - Continue diuresis for now. - Avoid vasodilator blood pressure medications in this patient - Can consider IV captopril or clonidine in this patient for better blood pressure control.    # Coronary artery disease: Patient with known single vessel disease involving her 3rd obtuse marginal.  Undergoing TAVR work up at this time.  Low suspicion that her current symptoms are related to coronary artery disease.  She is not on a beta-blocker due to aortic stenosis, but is on other guideline directed medical therapy. - Continue home aspirin and pravastatin.    # Hypertension Blood pressure is substantially elevated with potential hypertensive urgency in the setting of aortic stenosis.  She does have stage II chronic kidney disease.   - Not many options for blood pressure control based on her aortic stenosis.  Restart home blood pressure medications first. - Can consider clonidine or IV captopril as an option in this patient.    Signed, Jon Billings, MD 09/11/2016,  8:40 PM

## 2016-09-11 NOTE — ED Triage Notes (Signed)
Pt woke up sob this am, used her home ned and at 1 pm used it aagain, then called ems she has had 10 albuteral, .5 atrovent, 2 mg mag, 5 atroven,125 solumedrol, pt has 24 in rt wrist

## 2016-09-11 NOTE — H&P (Signed)
History and Physical    [name]:[id number] DOB: 1936-06-25 DOA: 09/11/2016  PCP: Patricia Hubert, MD Consultants:  Patricia [name] - cardiology; Patricia [name] - CT surgery; Patricia [name] - ortho Patient coming from: home - lives alone; [place]: daughter, [phone]  Chief Complaint: SOB  HPI: Patricia [name] is a 80 y.o. female with medical history significant of severe AS (undergoing evaluation for likely TAVR), afib s/p cardioversion in 12/16, HTN, DM, HLD presenting with acute on chronic respiratory failure.  Patient awoke this AM at about 0400 unable to breathe.  Has asthma, was wheezing, unable to get her breath.  She was asleep and just couldn't catch her breath.  Doesn't feel great every day, thought she might be getting a cold but didn't feel really bad prior to this episode.  Sleeps on 1 pillow.  Does not usually wake up SOB.  Used nebulizer (albuterol), some improvement the first time for about an hour.  Then started vomiting, continued with diarrhea and vomiting.  No improvement with albuterol after that.  Remains a little nauseated intermittently but her other symptoms resolved completely.  No sick contacts with GI bug (just URI).  Gasping for breath, called 911.  No cough.  Feeling better now since arriving in ER and being started on [place] O2.  Mild LLE edema.  Had a heart cath on 11/7 showed: single vessel obstructive CAD involving OM3; moderate to severe AS (mean gradient 41 mm Hg, valve index 0.7); mild pulm HTN; elevated LV filling pressures with decreased LV compliance; and normal cardiac output.  Recommendation for this study was for AVR and single vessel CABG.  Still requires a number of tests yet before approval for TAVR.  May be weeks to months yet.  Upcoming tests include  PFT, carotid US, Coronary CTA, and CTA of chest/abd/pelvis.   ED Course: Per Dr. Zenia [name]:  80 year old female with acute onset of stress of breath this morning. She's had dyspnea on exertion. Denies any anginal type chest pain.  Check x-ray consistent with CHF. Given IV Lasix here. Will be admitted to medicine   Review of Systems: As per HPI; otherwise 10 point review of systems reviewed and negative.   Ambulatory Status:  Ambulates independently.  Past Medical History:  Diagnosis Date  . Allergy   . Anemia, unspecified   . Arthritis   . Asthma   . Atrial fibrillation status post cardioversion [facility]) 09/2015   s/p TEE/DCCV>>SR on amio  . Benign neoplasm of colon   . Carpal tunnel syndrome   . Cellulitis and abscess of finger, unspecified   . Cerumen impaction   . Cervicalgia   . Chronic airway obstruction, not elsewhere classified   . Chronic anticoagulation 09/2015   Xarelto for afib, CHADS2VASC=5  . Diarrhea   . Diffuse cystic mastopathy   . Dysrhythmia   . Edema   . Encounter for long-term (current) use of other medications   . GERD (gastroesophageal reflux disease)   . Heart murmur   . Lumbago   . Neck pain 05/23/2015  . Obesity, unspecified   . Other and unspecified hyperlipidemia   . Other psoriasis   . Pain in joint, lower leg   . PONV (postoperative nausea and vomiting)   . Primary localized osteoarthrosis of right shoulder 12/18/2015  . Routine gynecological examination   . Scoliosis (and kyphoscoliosis), idiopathic   . Severe aortic stenosis   . Shortness of breath dyspnea    with exertion  . Type II or unspecified type diabetes mellitus  without mention of complication, uncontrolled   . Unspecified essential hypertension   . Unspecified hemorrhoids without mention of complication   . Unspecified hypothyroidism   . Urge incontinence     Past Surgical History:  Procedure Laterality Date  . ABDOMINAL HYSTERECTOMY  1987   complete  . BREAST SURGERY     right breast 3 surgeries [phone]  . CARDIAC CATHETERIZATION N/A 09/02/2016   Procedure: Right/Left Heart Cath and Coronary Angiography;  Surgeon: Patricia M Martinique, MD;  Location: [place] [facility];  Service: Cardiovascular;   Laterality: N/A;  . CARDIOVERSION N/A 10/19/2015   Procedure: CARDIOVERSION;  Surgeon: Patricia Perla, MD;  Location: [place];  Service: Cardiovascular;  Laterality: N/A;  . [place]  . COLON SURGERY     2004,2007,2013.  3 times colon surgieres  . EXCISE LE MANDIBULAR LYMPH NODE T     DR Patricia [name]  . FOOT SURGERY  1989  . LEFT SHOULDER ARTHROSCOPY    . 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 NEUROPLASTY / TRANSPOSITION MEDIAN NERVE AT CARPAL TUNNEL BILATERAL  05/2003  . TEE WITHOUT CARDIOVERSION N/A 10/19/2015   Procedure: Transesophageal Echocardiogram (TEE) ;  Surgeon: Patricia Perla, MD;  Location[facility];  Service: Cardiovascular;  Laterality: N/A;  . TOTAL SHOULDER ARTHROPLASTY Right 12/18/2015   Procedure: TOTAL SHOULDER ARTHROPLASTY;  Surgeon: Patricia Bond, MD;  Location: [facility];  Service: Orthopedics;  Laterality: Right;  . TRIGGER FINGER RELEASE Left   . VAGINAL CYST REMOVED  1967    Social History   Social History  . Marital status: Divorced    Spouse name: N/A  . Number of children: 1  . Years of education: N/A   Occupational History  . Retired Market researcher for [employer] History Main Topics  . Smoking status: Former Smoker    Years: 40.00    Quit date: 09/07/1989  . Smokeless tobacco: Never Used  . Alcohol use No  . Drug use: No  . Sexual activity: No   Other Topics Concern  . Not on file   Social History Narrative   Her daughter & 3 grandchildren live in the area.      Allergies  Allergen Reactions  . Codeine Other (See Comments)    "spaces her out, dizziness, can't walk well"  . Vesicare [Solifenacin] Itching    Family History  Problem Relation Age of Onset  . Diabetes Father   . Stroke Father   . Heart disease Father   . Liver cancer Sister   . Heart disease Brother   . Asthma Sister   . Arthritis Sister     Prior to Admission medications   Medication Sig Start Date End Date Taking?  Authorizing Provider  acetaminophen (TYLENOL) 500 MG tablet Take 500 mg by mouth every 6 (six) hours as needed for mild pain.   Yes Historical Provider, MD  ADVAIR DISKUS 100-50 MCG/DOSE AEPB Inhale 1 inhalation two  times daily for breathing Patient taking differently: Inhale  puff into the lungs two times a day 07/07/16  Yes Estill Dooms, MD  albuterol (PROVENTIL HFA;VENTOLIN HFA) 108 (90 BASE) MCG/ACT inhaler Inhale 2 puffs into the lungs every 6 (six) hours as needed for wheezing or shortness of breath. 01/24/15  Yes Estill Dooms, MD  albuterol (PROVENTIL) (2.5 MG/3ML) 0.083% nebulizer solution Take 2.5 mg by nebulization every 6 (six) hours as needed for wheezing or shortness of breath.  Yes Historical Provider, MD  amiodarone (PACERONE) 200 MG tablet Take one tablet daily by mouth Patient taking differently: Take 200 mg by mouth daily.  11/20/15  Yes Rhonda G Barrett, PA-C  cetirizine (ZYRTEC) 10 MG tablet Take 10 mg by mouth daily as needed for allergies.    Yes Historical Provider, MD  cholecalciferol (VITAMIN D) 1000 units tablet One daily for vitamin D supplement Patient taking differently: Take 1,000 Units by mouth at bedtime. One daily for vitamin D supplement 11/27/15  Yes Estill Dooms, MD  COMFORT LANCETS MISC Use daily to get blood for glucometer 03/08/14  Yes Estill Dooms, MD  diltiazem (CARDIZEM CD) 180 MG 24 hr capsule Take 1 capsule by mouth  once a day to control blood pressure Patient taking differently: Take 180 mg by mouth daily. To control blood pressure 11/20/15  Yes Rhonda G Barrett, PA-C  doxazosin (CARDURA) 4 MG tablet TAKE 1 TABLET BY MOUTH ONCE DAILY TO HELP CONTROL BLOOD PRESSURE Patient taking differently: Take 4 mg by mouth at bedtime 08/27/16  Yes Estill Dooms, MD  ferrous sulfate 325 (65 FE) MG EC tablet Take 1 tablet (325 mg total) by mouth daily with breakfast. 07/02/16  Yes Patricia M Martinique, MD  furosemide (LASIX) 40 MG tablet Take 40 mg by mouth every other  day.    Yes Historical Provider, MD  glimepiride (AMARYL) 2 MG tablet Take 1 tablet by mouth at  breakfast to control  Glucose Patient taking differently: Take 2 mg by mouth at supper to control glucose 07/16/16  Yes Estill Dooms, MD  glucose blood (BAYER CONTOUR TEST) test strip Use daily too check glucose 03/08/14  Yes Estill Dooms, MD  levothyroxine (SYNTHROID, LEVOTHROID) 50 MCG tablet Take 1 tablet by mouth  daily for thyroid  supplement Patient taking differently: Take 50 mcg by mouth once a day (THYROID) 07/07/16  Yes Estill Dooms, MD  metFORMIN (GLUCOPHAGE) 500 MG tablet Take 500 mg by mouth 2 (two) times daily with a meal.   Yes Historical Provider, MD  pantoprazole (PROTONIX) 40 MG tablet Take 1 tablet (40 mg total) by mouth daily. 07/21/16  Yes [name], NP  potassium gluconate 595 (99 K) MG TABS tablet Take 595 mg by mouth every other day. TAKES ONLY WHEN USING FUROSEMIDE   Yes Historical Provider, MD  pravastatin (PRAVACHOL) 20 MG tablet TAKE 1 TABLET BY MOUTH  DAILY Patient taking differently: Take 20 mg by mouth daily with supper.  08/26/16  Yes Estill Dooms, MD  triamcinolone (KENALOG) 0.025 % cream Apply 1 application topically daily as needed (psoriasis).    Yes Historical Provider, MD  mirabegron ER (MYRBETRIQ) 25 MG TB24 tablet Take 1 tablet (25 mg total) by mouth daily. Patient not taking: Reported on 09/11/2016 07/21/16   [name], NP    Physical Exam: Vitals:   09/11/16 1436 09/11/16 1442 09/11/16 2109 09/11/16 2235  BP:  (!) 133/48 152/55 (!) 185/55  Pulse:  87 81 83  Resp:  _0 Temp:  98.2 F (36.8 C)  98.2 F (36.8 C)  TempSrc:    Oral  SpO2: 98% (!) 89% 92% 97%  Weight:    100.7 kg (222 lb 1.6 oz)  Height:    _1  (1.6 m)     General: Appears calm and comfortable and is NAD Eyes:  PERRL, EOMI, normal lids, iris ENT:  grossly normal hearing, lips & tongue, mmm Neck:  no  LAD, masses or thyromegaly Cardiovascular:  RRR, no m/r/g.  No LE edema.  Respiratory:  CTA bilaterally, no w/r/r. Mildly increased respiratory effort despite [place] O2 which worsens during minimal exertion or conversation. Abdomen:  soft, ntnd, NABS Skin:  no rash or induration seen on limited exam Musculoskeletal:  grossly normal tone BUE/BLE, good ROM, no bony abnormality Psychiatric:  grossly normal mood and affect, speech fluent and appropriate, AOx3 Neurologic:  CN 2-12 grossly intact, moves all extremities in coordinated fashion, sensation intact  Labs on Admission: I have personally reviewed following labs and imaging studies  CBC:  Recent Labs Lab 09/11/16 1602  WBC 8.6  HGB 10.0*  HCT 31.7*  MCV 86.6  PLT 295   Basic Metabolic Panel:  Recent Labs Lab 09/11/16 1602  NA 141  K 4.8  CL 105  CO2 26  GLUCOSE 181*  BUN 18  CREATININE 1.13*  CALCIUM 9.5   GFR: Estimated Creatinine Clearance: 46.4 mL/min (by C-G formula based on SCr of 1.13 mg/dL (H)). Liver Function Tests: No results for input(s): AST, ALT, ALKPHOS, BILITOT, PROT, ALBUMIN in the last 168 hours. No results for input(s): LIPASE, AMYLASE in the last 168 hours. No results for input(s): AMMONIA in the last 168 hours. Coagulation Profile: No results for input(s): INR, PROTIME in the last 168 hours. Cardiac Enzymes: No results for input(s): CKTOTAL, CKMB, CKMBINDEX, TROPONINI in the last 168 hours. BNP (last 3 results) No results for input(s): PROBNP in the last 8760 hours. HbA1C: No results for input(s): HGBA1C in the last 72 hours. CBG:  Recent Labs Lab 09/11/16 2245  GLUCAP 216*   Lipid Profile: No results for input(s): CHOL, HDL, LDLCALC, TRIG, CHOLHDL, LDLDIRECT in the last 72 hours. Thyroid Function Tests: No results for input(s): TSH, T4TOTAL, FREET4, T3FREE, THYROIDAB in the last 72 hours. Anemia Panel: No results for input(s): VITAMINB12, FOLATE, FERRITIN, TIBC, IRON, RETICCTPCT in the last 72 hours. Urine analysis:    Component Value Date/Time    BILIRUBINUR negative 07/18/2015 1337   BILIRUBINUR small 10/07/2012 1544   KETONESUR negative 07/18/2015 1337   PROTEINUR trace (A) 07/18/2015 1337   PROTEINUR >=300 10/07/2012 1544   UROBILINOGEN 0.2 07/18/2015 1337   NITRITE Negative 07/18/2015 1337   NITRITE negative 10/07/2012 1544   LEUKOCYTESUR Negative 07/18/2015 1337    Creatinine Clearance: Estimated Creatinine Clearance: 46.4 mL/min (by C-G formula based on SCr of 1.13 mg/dL (H)).  Sepsis Labs: _0 (procalcitonin:4,lacticidven:4) )No results found for this or any previous visit (from the past 240 hour(s)).   Radiological Exams on Admission: Dg Chest 2 View  Result Date: 09/11/2016 CLINICAL DATA:  Chest pain EXAM: CHEST  2 VIEW COMPARISON:  10/18/2015 FINDINGS: Cardiac enlargement. Congestive heart failure with edema and small pleural effusions. Edema has progressed since prior study. Bibasilar atelectasis. IMPRESSION: Congestive heart failure with pulmonary edema and small pleural effusions. Electronically Signed   By: Franchot Gallo M.D.   On: 09/11/2016 15:14    EKG: Independently reviewed.  NSR with rate 86; ST depression in inferolateral leads  Assessment/Plan Principal Problem:   Acute respiratory failure (HCC) Active Problems:   Essential hypertension   DM type 2 (diabetes mellitus, type 2) (HCC)   Hypothyroidism   COPD (chronic obstructive pulmonary disease) (HCC)   Hyperlipidemia   Anemia   Chronic diastolic CHF (congestive heart failure) (HCC)   Severe aortic stenosis   Acute on chronic respiratory failure with severe AS -Awoke from sleep with acute onset of SOB that markedly improved in the  ER with O2 and rest (prior to Lasix) -No LE edema, BNP minimally elevated (and lower than in 12/16), lung exam relatively unremarkable, but pulmonary edema was seen on CXR -Echo in 7/17 showed the AS as well as preserved EF and grade 2 diastolic dysfunction -Suspect that this is multifactorial - mild CHF  exacerbation and moderate-severe AS with 1 vessel CAD -Cardiology consult is pending -Will admit on telemetry -CHF order set utilized, although there is likely more to her issue than simply CHF -Is undergoing evaluation for TAVR - based on recent cath, she may actually need an open procedure with CABG too but will defer to cards/CT surgery -She is due for a number of pre-op evaluations including PFTs, carotid US, coronary CTA, and CTA of C/A/P -Would consider doing at least the coronary CTA and possibly also the CTA C/A/P as an inpatient during this admission, as it may be relevant to her current presentation -Continue Lasix 40 mg IV BID for now -Trend troponins and repeat EKG in AM -[place] O2 -For COPD, continue Advair (Dulera substituted due to formulary), add Duonebs standing and Albuterol q2h prn  HTN, h/o afib s/p cardioversion in 12/16 -Continue Amiodarone for rhythm control -Continue Cardizem for rate control -Continue Cardura for BP -It is not entirely clear at this time why she is not on ACE/BB therapy; consider addition of these medications in the future - although if expectation is for surgical intervention soon (TAVR vs. Open AVR/CABG) then she may either need to start beta-blocker and wait for surgery or hold beta-blocker until post-operative period due to increased risk for morbidity/mortality in patients started on beta blockers perioperatively; will defer to cardiology  HLD -Continue Pravachol -FLP in 7/17: TC 182, TG 174, HDL 61, LDL 86  DM -A1c 6.8 in 7/17, which is reasonable given her age -Hold Amaryl, glucophage as inpatient -Cover with SSI  Hypothyroidism -Mildly increased TSH (6.030) in 7/17 without obvious f/u -Will continue home dose of Synthroid while checking TSH and free T4 now  Anemia -At approximate baseline -Will follow  DVT prophylaxis:  Lovenox  Code Status:  Full - confirmed with patient Family Communication: None present Disposition Plan: Home once  clinically improved Consults called: Cardiology Admission status: Admit - It is my clinical opinion that admission to [facility] is reasonable and necessary because this patient will require at least 2 midnights in the hospital to treat this condition based on the medical complexity of the problems presented.  Given the aforementioned information, the predictability of an adverse outcome is felt to be significant.     Patricia Bongo MD [facility]  If 7PM-7AM, please contact night-coverage [email or web address] Password [email or web address]  09/11/2016, 11:25 PM

## 2016-09-11 NOTE — ED Provider Notes (Signed)
I saw and evaluated the patient, reviewed the resident's note and I agree with the findings and plan.   EKG Interpretation  Date/Time:  Thursday September 11 2016 14:42:42 EST Ventricular Rate:  86 PR Interval:    QRS Duration: 103 QT Interval:  357 QTC Calculation: 427 R Axis:   72 Text Interpretation:  Sinus rhythm Prolonged PR interval Nonspecific repol abnormality, diffuse leads downsloping st changes in the inferior and lateral leads, seen prior but more pronounced on this ecg Otherwise no significant change Confirmed by Tyrone Nine MD, Quillian Quince ([id number]) on 09/11/2016 2:47:59 PM Also confirmed by Tyrone Nine MD, DANIEL [phone]), editor [place] CT, Leda Gauze [phone])  on 09/11/2016 3:27:45 PM     80 year old female with acute onset of stress of breath this morning. She's had dyspnea on exertion. Denies any anginal type chest pain. Check x-ray consistent with CHF. Given IV Lasix here. Will be admitted to medicine   Lacretia Leigh, MD 09/11/16 [phone]

## 2016-09-11 NOTE — Telephone Encounter (Signed)
09/11/16  Called and left msg. on home & mobile # for patient to call Dental Medicine to schedule Dental Consult w/Dr. Tommie Raymond.  LRI

## 2016-09-11 NOTE — ED Notes (Signed)
Pt up to bedside commode and tolerated well.

## 2016-09-11 NOTE — ED Provider Notes (Signed)
[facility] DEPT Provider Note   CSN: [id number] Arrival date & time: 09/11/16  1435     History   Chief Complaint Chief Complaint  Patient presents with  . Shortness of Breath  . Chest Pain    HPI Patricia [name] is a 80 y.o. female history of dementia fibrillation status post cardioversion, COPD (on no home O2), severe aortic stenosis who on 11/7 had a right and left heart cath which showed severe AS. Mild pulm HTN at that time. With evidence of decreased LV compliance. Normal CO a that time. She presents to the emergency department noting significant worsening of her exertional shortness of breath and chest tightness upon awakening this morning. She tried her home albuterol inhalers to no avail of her symptoms throughout the morning and given the severity of her dyspnea on exertion called EMS. On arrival by EMS the patient was found to be hypoxic on room air and was placed on 2 L by nasal cannula returning her to normal oxygen saturation. She was given albuterol, Atrovent, magnesium, Solu-Medrol in the field and was brought directly to [facility] ED for further evaluation. On arrival the patient is GCS 15 and in no acute distress sitting comfortably. She states that she has no significant symptoms until she tries to exert herself.   HPI  Past Medical History:  Diagnosis Date  . Allergy   . Anemia, unspecified   . Arthritis   . Asthma   . Atrial fibrillation status post cardioversion [facility]) 09/2015   s/p TEE/DCCV>>SR on amio  . Benign neoplasm of colon   . Carpal tunnel syndrome   . Cellulitis and abscess of finger, unspecified   . Cerumen impaction   . Cervicalgia   . Chronic airway obstruction, not elsewhere classified   . Chronic anticoagulation 09/2015   Xarelto for afib, CHADS2VASC=5  . Diarrhea   . Diffuse cystic mastopathy   . Dysrhythmia   . Edema   . Encounter for long-term (current) use of other medications   . GERD (gastroesophageal reflux disease)   . Heart murmur     . Lumbago   . Neck pain 05/23/2015  . Obesity, unspecified   . Other and unspecified hyperlipidemia   . Other psoriasis   . Pain in joint, lower leg   . PONV (postoperative nausea and vomiting)   . Primary localized osteoarthrosis of right shoulder 12/18/2015  . Routine gynecological examination   . Scoliosis (and kyphoscoliosis), idiopathic   . Severe aortic stenosis   . Shortness of breath dyspnea    with exertion  . Type II or unspecified type diabetes mellitus without mention of complication, uncontrolled   . Unspecified essential hypertension   . Unspecified hemorrhoids without mention of complication   . Unspecified hypothyroidism   . Urge incontinence     Patient Active Problem List   Diagnosis Date Noted  . Severe aortic stenosis 09/11/2016  . Acute respiratory failure ([place]) 09/11/2016  . Acute blood loss anemia 02/06/2016  . Chronic diastolic CHF (congestive heart failure) ([place]) 02/06/2016  . Atrial fibrillation ([place]) 02/05/2016  . Primary localized osteoarthrosis of right shoulder 12/18/2015  . Osteoarthritis of right shoulder 12/18/2015  . Post-nasal drainage 11/27/2015  . Acute congestive heart failure ([place])   . Anemia 09/26/2015  . Neck pain 05/23/2015  . Knee pain, bilateral 07/20/2014  . Lumbago 11/02/2013  . Trigger finger, acquired 11/02/2013  . Hyperlipidemia 11/02/2013  . Urinary incontinence 11/02/2013  . Rash and nonspecific skin eruption 11/02/2013  .  COPD (chronic obstructive pulmonary disease) ([place])   . Hypothyroidism   . Obesity   . Essential hypertension 03/02/2013  . DM type 2 (diabetes mellitus, type 2) ([place]) 03/02/2013    Past Surgical History:  Procedure Laterality Date  . ABDOMINAL HYSTERECTOMY  1987   complete  . BREAST SURGERY     right breast 3 surgeries [phone]  . CARDIAC CATHETERIZATION N/A 09/02/2016   Procedure: Right/Left Heart Cath and Coronary Angiography;  Surgeon: [name] M Martinique, MD;  Location: [facility];   Service: Cardiovascular;  Laterality: N/A;  . CARDIOVERSION N/A 10/19/2015   Procedure: CARDIOVERSION;  Surgeon: Lelon Perla, MD;  Location: [facility];  Service: Cardiovascular;  Laterality: N/A;  . [place]  . COLON SURGERY     2004,2007,2013.  3 times colon surgieres  . EXCISE LE MANDIBULAR LYMPH NODE T     DR C. NEWMAN  . FOOT SURGERY  1989  . LEFT SHOULDER ARTHROSCOPY    . 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 NEUROPLASTY / TRANSPOSITION MEDIAN NERVE AT CARPAL TUNNEL BILATERAL  05/2003  . TEE WITHOUT CARDIOVERSION N/A 10/19/2015   Procedure: Transesophageal Echocardiogram (TEE) ;  Surgeon: Lelon Perla, MD;  Location: [facility];  Service: Cardiovascular;  Laterality: N/A;  . TOTAL SHOULDER ARTHROPLASTY Right 12/18/2015   Procedure: TOTAL SHOULDER ARTHROPLASTY;  Surgeon: [name], MD;  Location: [facility];  Service: Orthopedics;  Laterality: Right;  . TRIGGER FINGER RELEASE Left   . VAGINAL CYST REMOVED  1967    OB History    No data available       Home Medications    Prior to Admission medications   Medication Sig Start Date End Date Taking? Authorizing Provider  acetaminophen (TYLENOL) 500 MG tablet Take 500 mg by mouth every 6 (six) hours as needed for mild pain.   Yes Historical Provider, MD  ADVAIR DISKUS 100-50 MCG/DOSE AEPB Inhale 1 inhalation two  times daily for breathing Patient taking differently: Inhale  puff into the lungs two times a day 07/07/16  Yes Estill Dooms, MD  albuterol (PROVENTIL HFA;VENTOLIN HFA) 108 (90 BASE) MCG/ACT inhaler Inhale 2 puffs into the lungs every 6 (six) hours as needed for wheezing or shortness of breath. 01/24/15  Yes Estill Dooms, MD  albuterol (PROVENTIL) (2.5 MG/3ML) 0.083% nebulizer solution Take 2.5 mg by nebulization every 6 (six) hours as needed for wheezing or shortness of breath.   Yes Historical Provider, MD  amiodarone (PACERONE) 200 MG tablet Take one tablet daily by mouth Patient  taking differently: Take 200 mg by mouth daily.  11/20/15  Yes Rhonda G Barrett, PA-C  cetirizine (ZYRTEC) 10 MG tablet Take 10 mg by mouth daily as needed for allergies.    Yes Historical Provider, MD  cholecalciferol (VITAMIN D) 1000 units tablet One daily for vitamin D supplement Patient taking differently: Take 1,000 Units by mouth at bedtime. One daily for vitamin D supplement 11/27/15  Yes Estill Dooms, MD  COMFORT LANCETS MISC Use daily to get blood for glucometer 03/08/14  Yes Estill Dooms, MD  diltiazem (CARDIZEM CD) 180 MG 24 hr capsule Take 1 capsule by mouth  once a day to control blood pressure Patient taking differently: Take 180 mg by mouth daily. To control blood pressure 11/20/15  Yes Rhonda G Barrett, PA-C  doxazosin (CARDURA) 4 MG tablet TAKE 1 TABLET BY MOUTH ONCE DAILY TO HELP CONTROL BLOOD PRESSURE Patient taking differently: Take 4  mg by mouth at bedtime 08/27/16  Yes Estill Dooms, MD  ferrous sulfate 325 (65 FE) MG EC tablet Take 1 tablet (325 mg total) by mouth daily with breakfast. 07/02/16  Yes [name] M Martinique, MD  furosemide (LASIX) 40 MG tablet Take 40 mg by mouth every other day.    Yes Historical Provider, MD  glimepiride (AMARYL) 2 MG tablet Take 1 tablet by mouth at  breakfast to control  Glucose Patient taking differently: Take 2 mg by mouth at supper to control glucose 07/16/16  Yes Estill Dooms, MD  glucose blood (BAYER CONTOUR TEST) test strip Use daily too check glucose 03/08/14  Yes Estill Dooms, MD  levothyroxine (SYNTHROID, LEVOTHROID) 50 MCG tablet Take 1 tablet by mouth  daily for thyroid  supplement Patient taking differently: Take 50 mcg by mouth once a day (THYROID) 07/07/16  Yes Estill Dooms, MD  metFORMIN (GLUCOPHAGE) 500 MG tablet Take 500 mg by mouth 2 (two) times daily with a meal.   Yes Historical Provider, MD  pantoprazole (PROTONIX) 40 MG tablet Take 1 tablet (40 mg total) by mouth daily. 07/21/16  Yes [name], NP  potassium gluconate  595 (99 K) MG TABS tablet Take 595 mg by mouth every other day. TAKES ONLY WHEN USING FUROSEMIDE   Yes Historical Provider, MD  pravastatin (PRAVACHOL) 20 MG tablet TAKE 1 TABLET BY MOUTH  DAILY Patient taking differently: Take 20 mg by mouth daily with supper.  08/26/16  Yes Estill Dooms, MD  triamcinolone (KENALOG) 0.025 % cream Apply 1 application topically daily as needed (psoriasis).    Yes Historical Provider, MD    Family History Family History  Problem Relation Age of Onset  . Diabetes Father   . Stroke Father   . Heart disease Father   . Liver cancer Sister   . Heart disease Brother   . Asthma Sister   . Arthritis Sister     Social History Social History  Substance Use Topics  . Smoking status: Former Smoker    Years: 40.00    Quit date: 09/07/1989  . Smokeless tobacco: Never Used  . Alcohol use No     Allergies   Codeine and Vesicare [solifenacin]   Review of Systems Review of Systems  Constitutional: Positive for activity change and fatigue. Negative for chills and fever.  HENT: Negative for congestion, rhinorrhea, sinus pressure and sneezing.   Respiratory: Positive for chest tightness and shortness of breath.   Cardiovascular: Negative for chest pain, palpitations and leg swelling.  Gastrointestinal: Negative for abdominal pain, nausea and vomiting.  Skin: Negative for rash.  Neurological: Positive for light-headedness. Negative for syncope, weakness, numbness and headaches.  All other systems reviewed and are negative.    Physical Exam Updated Vital Signs BP (!) 145/125 (BP Location: Left Arm)   Pulse 81   Temp 97.4 F (36.3 C) (Oral)   Resp 18   Ht _0  (1.6 m)   Wt 99.9 kg   SpO2 95%   BMI 39.02 kg/m   Physical Exam  Constitutional: She is oriented to person, place, and time. She appears well-developed and well-nourished. No distress.  HENT:  Head: Normocephalic and atraumatic.  Nose: Nose normal.  Mouth/Throat: Oropharynx is clear  and moist.  Eyes: Conjunctivae and EOM are normal. Pupils are equal, round, and reactive to light.  Neck: Normal range of motion. Neck supple. JVD present.  Cardiovascular: Normal rate, regular rhythm and intact distal pulses.  Murmur heard. Pulmonary/Chest: Effort normal. No respiratory distress. She has rales. She exhibits no tenderness.  Abdominal: Soft. She exhibits no distension. There is no tenderness.  Musculoskeletal: She exhibits edema (mild 1+ b/l). She exhibits no tenderness.  Neurological: She is alert and oriented to person, place, and time. No cranial nerve deficit. Coordination normal.  Skin: Skin is warm and dry. No rash noted. She is not diaphoretic.  Nursing note and vitals reviewed.    ED Treatments / Results  Labs (all labs ordered are listed, but only abnormal results are displayed) Labs Reviewed  CBC - Abnormal; Notable for the following:       Result Value   RBC 3.66 (*)    Hemoglobin 10.0 (*)    HCT 31.7 (*)    RDW 15.9 (*)    All other components within normal limits  BASIC METABOLIC PANEL - Abnormal; Notable for the following:    Glucose, Bld 181 (*)    Creatinine, Ser 1.13 (*)    GFR calc non Af Amer 45 (*)    GFR calc Af Amer 53 (*)    All other components within normal limits  BRAIN NATRIURETIC PEPTIDE - Abnormal; Notable for the following:    B Natriuretic Peptide 396.9 (*)    All other components within normal limits  GLUCOSE, CAPILLARY - Abnormal; Notable for the following:    Glucose-Capillary 216 (*)    All other components within normal limits  BASIC METABOLIC PANEL - Abnormal; Notable for the following:    Chloride 99 (*)    Glucose, Bld 224 (*)    Creatinine, Ser 1.31 (*)    GFR calc non Af Amer 38 (*)    GFR calc Af Amer 44 (*)    All other components within normal limits  CBC WITH DIFFERENTIAL/PLATELET - Abnormal; Notable for the following:    RBC 3.53 (*)    Hemoglobin 9.5 (*)    HCT 30.3 (*)    RDW 16.0 (*)    Lymphs Abs 0.5  (*)    All other components within normal limits  TROPONIN I - Abnormal; Notable for the following:    Troponin I 0.05 (*)    All other components within normal limits  TROPONIN I - Abnormal; Notable for the following:    Troponin I 0.03 (*)    All other components within normal limits  GLUCOSE, CAPILLARY - Abnormal; Notable for the following:    Glucose-Capillary 226 (*)    All other components within normal limits  GLUCOSE, CAPILLARY - Abnormal; Notable for the following:    Glucose-Capillary 137 (*)    All other components within normal limits  TSH  T4, FREE  CBC WITH DIFFERENTIAL/PLATELET  TROPONIN I  I-STAT TROPOININ, ED  I-STAT TROPOININ, ED    EKG  EKG Interpretation  Date/Time:  Thursday September 11 2016 14:42:42 EST Ventricular Rate:  86 PR Interval:    QRS Duration: 103 QT Interval:  357 QTC Calculation: 427 R Axis:   72 Text Interpretation:  Sinus rhythm Prolonged PR interval Nonspecific repol abnormality, diffuse leads downsloping st changes in the inferior and lateral leads, seen prior but more pronounced on this ecg Otherwise no significant change Confirmed by FLOYD MD, Quillian Quince ([id number]) on 09/11/2016 2:47:59 PM Also confirmed by Tyrone Nine MD, DANIEL [phone]), editor [place] CT, Leda Gauze [phone])  on 09/11/2016 3:27:11 PM       Radiology Dg Chest 2 View  Result Date: 09/11/2016 CLINICAL DATA:  Chest pain EXAM: CHEST  2 VIEW COMPARISON:  10/18/2015 FINDINGS: Cardiac enlargement. Congestive heart failure with edema and small pleural effusions. Edema has progressed since prior study. Bibasilar atelectasis. IMPRESSION: Congestive heart failure with pulmonary edema and small pleural effusions. Electronically Signed   By: Franchot Gallo M.D.   On: 09/11/2016 15:14    Procedures Procedures (including critical care time)  Medications Ordered in ED Medications  albuterol (PROVENTIL) (2.5 MG/3ML) 0.083% nebulizer solution 2.5 mg (not administered)  doxazosin (CARDURA) tablet  4 mg (4 mg Oral Given 09/11/16 2308)  pravastatin (PRAVACHOL) tablet 20 mg (not administered)  pantoprazole (PROTONIX) EC tablet 40 mg (40 mg Oral Given 09/12/16 1034)  levothyroxine (SYNTHROID, LEVOTHROID) tablet 50 mcg (50 mcg Oral Given 09/12/16 0622)  mometasone-formoterol (DULERA) 100-5 MCG/ACT inhaler 2 puff (2 puffs Inhalation Given 09/12/16 1016)  ferrous sulfate tablet 325 mg (325 mg Oral Given 09/12/16 0801)  amiodarone (PACERONE) tablet 200 mg (200 mg Oral Given 09/12/16 1034)  diltiazem (CARDIZEM CD) 24 hr capsule 180 mg (180 mg Oral Given 09/12/16 1034)  sodium chloride flush (NS) 0.9 % injection 3 mL (3 mLs Intravenous Given 09/12/16 1040)  sodium chloride flush (NS) 0.9 % injection 3 mL (not administered)  0.9 %  sodium chloride infusion (not administered)  aspirin EC tablet 81 mg (81 mg Oral Given 09/12/16 1034)  acetaminophen (TYLENOL) tablet 650 mg (not administered)  ondansetron (ZOFRAN) injection 4 mg (not administered)  enoxaparin (LOVENOX) injection 40 mg (40 mg Subcutaneous Given 09/11/16 2308)  furosemide (LASIX) injection 40 mg (40 mg Intravenous Given 09/12/16 0756)  insulin aspart (novoLOG) injection 0-15 Units (5 Units Subcutaneous Given 09/12/16 0649)  ipratropium-albuterol (DUONEB) 0.5-2.5 (3) MG/3ML nebulizer solution 3 mL (3 mLs Nebulization Given 09/12/16 1011)  Influenza vac split quadrivalent PF (FLUARIX) injection 0.5 mL (not administered)  aspirin chewable tablet 324 mg (324 mg Oral Given 09/11/16 1909)  furosemide (LASIX) injection 40 mg (40 mg Intravenous Given 09/11/16 1855)     Initial Impression / Assessment and Plan / ED Course  I have reviewed the triage vital signs and the nursing notes.  Pertinent labs & imaging results that were available during my care of the patient were reviewed by me and considered in my medical decision making (see chart for details).  Clinical Course    80 y.o. female with severe AS presents to the ED noting  worsening DOE and found to have a new O2 requirement. Exam as above concerning for severe AS precipitating CHF. She denies any URI like sx suggestive of COPD exacerbation and she states that this feels different from prior SOB she has had. CXR shows evidence of CHF. Labs were drawn and returned showing neg initial trop and BNP of 396.9. Otherwise no significant anemia, or leukocytosis. Given her new Oxygen requirement and worsened SOB she was given lasix IV and was admitted to the Hospitalist for further care and assessment. Cardiology was consulted and agreed to see the patient at the bedside.   Final Clinical Impressions(s) / ED Diagnoses   Final diagnoses:  Severe aortic stenosis    New Prescriptions Current Discharge Medication List       Zenovia Jarred, DO 09/12/16 1234

## 2016-09-12 ENCOUNTER — Inpatient Hospital Stay (HOSPITAL_COMMUNITY): Payer: Medicare Other

## 2016-09-12 ENCOUNTER — Other Ambulatory Visit: Payer: Medicare Other

## 2016-09-12 ENCOUNTER — Other Ambulatory Visit: Payer: Self-pay | Admitting: *Deleted

## 2016-09-12 DIAGNOSIS — I5033 Acute on chronic diastolic (congestive) heart failure: Secondary | ICD-10-CM

## 2016-09-12 DIAGNOSIS — I160 Hypertensive urgency: Secondary | ICD-10-CM

## 2016-09-12 DIAGNOSIS — I350 Nonrheumatic aortic (valve) stenosis: Secondary | ICD-10-CM

## 2016-09-12 LAB — CBC WITH DIFFERENTIAL/PLATELET
Basophils Absolute: 0 10*3/uL (ref 0.0–0.1)
Basophils Relative: 0 %
Eosinophils Absolute: 0 10*3/uL (ref 0.0–0.7)
Eosinophils Relative: 0 %
HCT: 30.3 % — ABNORMAL LOW (ref 36.0–46.0)
Hemoglobin: 9.5 g/dL — ABNORMAL LOW (ref 12.0–15.0)
Lymphocytes Relative: 7 %
Lymphs Abs: 0.5 10*3/uL — ABNORMAL LOW (ref 0.7–4.0)
MCH: 26.9 pg (ref 26.0–34.0)
MCHC: 31.4 g/dL (ref 30.0–36.0)
MCV: 85.8 fL (ref 78.0–100.0)
Monocytes Absolute: 0.2 10*3/uL (ref 0.1–1.0)
Monocytes Relative: 3 %
Neutro Abs: 7 10*3/uL (ref 1.7–7.7)
Neutrophils Relative %: 90 %
Platelets: 231 10*3/uL (ref 150–400)
RBC: 3.53 MIL/uL — ABNORMAL LOW (ref 3.87–5.11)
RDW: 16 % — ABNORMAL HIGH (ref 11.5–15.5)
WBC: 7.7 10*3/uL (ref 4.0–10.5)

## 2016-09-12 LAB — TROPONIN I
Troponin I: 0.05 ng/mL (ref <0.03)
Troponin I: 0.03 ng/mL (ref <0.03)
Troponin I: 0.05 ng/mL (ref <0.03)

## 2016-09-12 LAB — TSH: TSH: 1.297 u[IU]/mL (ref 0.350–4.500)

## 2016-09-12 LAB — BASIC METABOLIC PANEL
Anion gap: 11 (ref 5–15)
BUN: 20 mg/dL (ref 6–20)
CO2: 28 mmol/L (ref 22–32)
Calcium: 9.2 mg/dL (ref 8.9–10.3)
Chloride: 99 mmol/L — ABNORMAL LOW (ref 101–111)
Creatinine, Ser: 1.31 mg/dL — ABNORMAL HIGH (ref 0.44–1.00)
GFR calc Af Amer: 44 mL/min — ABNORMAL LOW (ref >60)
GFR calc non Af Amer: 38 mL/min — ABNORMAL LOW (ref >60)
Glucose, Bld: 224 mg/dL — ABNORMAL HIGH (ref 65–99)
Potassium: 4.5 mmol/L (ref 3.5–5.1)
Sodium: 138 mmol/L (ref 135–145)

## 2016-09-12 LAB — GLUCOSE, CAPILLARY
Glucose-Capillary: 226 mg/dL — ABNORMAL HIGH (ref 65–99)
Glucose-Capillary: 137 mg/dL — ABNORMAL HIGH (ref 65–99)
Glucose-Capillary: 231 mg/dL — ABNORMAL HIGH (ref 65–99)
Glucose-Capillary: 144 mg/dL — ABNORMAL HIGH (ref 65–99)

## 2016-09-12 LAB — T4, FREE: Free T4: 0.83 ng/dL (ref 0.61–1.12)

## 2016-09-12 MED ORDER — METOPROLOL TARTRATE 5 MG/5ML IV SOLN
5.0000 mg | INTRAVENOUS | Status: DC | PRN
  Administered 2016-09-12 (×2): 5 mg via INTRAVENOUS

## 2016-09-12 MED ORDER — IOPAMIDOL (ISOVUE-370) INJECTION 76%
INTRAVENOUS | Status: AC
  Administered 2016-09-12: 80 mL
  Filled 2016-09-12: qty 100

## 2016-09-12 MED ORDER — INFLUENZA VAC SPLIT QUAD 0.5 ML IM SUSY
0.5000 mL | PREFILLED_SYRINGE | INTRAMUSCULAR | Status: AC
  Administered 2016-09-13: 0.5 mL via INTRAMUSCULAR
  Filled 2016-09-12: qty 0.5

## 2016-09-12 MED ORDER — METOPROLOL TARTRATE 5 MG/5ML IV SOLN
INTRAVENOUS | Status: AC
  Filled 2016-09-12: qty 10

## 2016-09-12 MED ORDER — IPRATROPIUM-ALBUTEROL 0.5-2.5 (3) MG/3ML IN SOLN
3.0000 mL | Freq: Three times a day (TID) | RESPIRATORY_TRACT | Status: DC
  Administered 2016-09-12 – 2016-09-14 (×7): 3 mL via RESPIRATORY_TRACT
  Filled 2016-09-12 (×8): qty 3

## 2016-09-12 MED ORDER — FUROSEMIDE 10 MG/ML IJ SOLN: 40.0000 mg | Freq: Every day | INTRAMUSCULAR | Status: DC

## 2016-09-12 NOTE — Consult Note (Signed)
[facility] CM Primary Care Navigator  09/12/2016  Patricia [name] 1936-10-06 [id number]   Met with patient at the bedside to identify possible discharge needs.  Patient reports increased difficulty of breathing and wheezing that led to this admission. Patient states she does not usually wake up with shortness of breathand does not require use of oxygen at home.  Patient reports that Dr[facility] Jeanmarie Hubert with [facility] is herprimary care provider.   [facility] and [facility]([place]to obtain medications withoutproblem affording at present even if currently on "donut hole" as stated. Patient reports managingher medications at home using "pill box" system.   Patient lives alone and fairly independent with self care. She also drives prior to admission.  Her daughter (Patricia L.) will be able to providetransportation to herdoctors'appointments.  Patient's daughter (lives close by) will be the primary caregiver as well. Son in-law and grandchildren will be able to assist with care needs if needed per patient. Plan for discharge is home once clinically improved per MD note.    Patient voiced understanding to call primary care provider's office when she gets home, for a post discharge follow-up appointment within a week or sooner if needed.  Patient letter provided as a reminder.  She mentioned having a previously scheduled appointment with PCP on 11/21 and encouraged her to maintain it.   Patient denies other needs or concerns and declined care coordination services and disease management/ education at this time. [facility] care management contact information provided to patient if future needs arise.   For additional questions please contact:  Patricia [name], BSN, RN-BC [facility] PRIMARY CARE Navigator Cell: [phone]

## 2016-09-12 NOTE — Progress Notes (Signed)
K.Kirby N.P. Paged and made aware of positive Trop. 0.05

## 2016-09-12 NOTE — Progress Notes (Signed)
Inpatient Diabetes Program Recommendations  AACE/ADA: New Consensus Statement on Inpatient Glycemic Control (2015)  Target Ranges:  Prepandial:   less than 140 mg/dL      Peak postprandial:   less than 180 mg/dL (1-2 hours)      Critically ill patients:  140 - 180 mg/dL   Lab Results  Component Value Date   GLUCAP 226 (H) 09/12/2016   HGBA1C 6.8 (H) 04/28/2016    Review of Glycemic Control Results for Patricia [name], Patricia [name] (MRN [id number]) as of 09/12/2016 11:05  Ref. Range 09/11/2016 22:45 09/12/2016 06:00  Glucose-Capillary Latest Ref Range: 65 - 99 mg/dL 216 (H) 226 (H)   Diabetes history: DM2 Outpatient Diabetes medications: Amaryl 2 mg qd+Metformin 500 mg bid Current orders for Inpatient glycemic control: Novolog correction 0-15 units tid  Inpatient Diabetes Program Recommendations:    While oral DM medications on hold, please consider: -Lantus 10 units qd  Thank you, Nani Gasser. Neomi Laidler, RN, MSN, CDE Inpatient Glycemic Control Team Team Pager [phone] (8am-5pm) 09/12/2016 11:06 AM

## 2016-09-12 NOTE — Progress Notes (Signed)
PROGRESS NOTE    Patricia [name]  TKW:[id number] DOB: 04/06/1936 DOA: 09/11/2016 PCP: Patricia Hubert, MD   Brief Narrative: 80 y.o. female with medical history significant of severe AS (undergoing evaluation for likely TAVR), afib s/p cardioversion in 12/16, HTN, DM, HLD presenting with SOB, acute on chronic respiratory failure.  Assessment & Plan:   #Likely acute diastolic congestive heart failure with pulmonary edema:  -Continue Lasix 40 mg daily. -Further evaluation as per cardiologist. -On metoprolol.  #History of coronary artery disease, elevated troponin level likely in the setting of CHF. Patient has no chest pain. -Continue aspirin, metoprolol, pravastatin  #Severe aortic stenosis undergoing evaluation for possible surgical intervention.  #  Acute respiratory failure with hypoxia: due to acute pulmonary edema seen on chest x-ray. Also has aortic stenosis. Currently on 2 L of oxygen. Try to wean off oxygen gradually.   # Essential hypertension: Monitor blood pressure. Continue current blood pressure medications.   # DM type 2 (diabetes mellitus, type 2) ([place]): Monitor blood sugar level. Continue sliding scale.   # Hypothyroidism: On Synthroid.    #Paroxysmal atrial fibrillation: Status post cardioversion in 2016 currently maintaining sinus rhythm. Not on anticoagulation because of history of anemia and heme positive stool with no clear GI source as per cardiology note.  #Chronic kidney disease stage III: Monitor serum creatinine level. On IV Lasix. Avoid nephrotoxins.  DVT prophylaxis: Lovenox subcutaneous Code Status: Full code Family Communication: No family present at bedside Disposition Plan: Likely discharge home in 1-2 days.    Consultants:   Cardiology  Procedures: None Antimicrobials: None  Subjective: Patient was seen and examined at bedside. Patient was sitting on chair comfortably. Denied chest pain, shortness of breath however has dry cough. No nausea  vomiting or abdominal pain.   Objective: Vitals:   09/11/16 2109 09/11/16 2235 09/11/16 2349 09/12/16 0452  BP: 152/55 (!) 185/55  (!) 145/125  Pulse: 81 83  81  Resp: _0 Temp:  98.2 F (36.8 C)  97.4 F (36.3 C)  TempSrc:  Oral  Oral  SpO2: 92% 97% 98% 95%  Weight:  100.7 kg (222 lb 1.6 oz)  99.9 kg (220 lb 4.8 oz)  Height:  _1  (1.6 m)      Intake/Output Summary (Last 24 hours) at 09/12/16 1334 Last data filed at 09/12/16 1300  Gross per 24 hour  Intake             1400 ml  Output             3175 ml  Net            -1775 ml   Filed Weights   09/11/16 2235 09/12/16 0452  Weight: 100.7 kg (222 lb 1.6 oz) 99.9 kg (220 lb 4.8 oz)    Examination:  General exam: Appears calm and comfortable  Respiratory system: Bibasal crackle, respiratory effort normal. Cardiovascular system: S1 & S2 heard, RRR, systolic murmur heard. Trace pedal edema. Gastrointestinal system: Abdomen is nondistended, soft and nontender. Normal bowel sounds heard. Central nervous system: Alert and oriented. No focal neurological deficits. Extremities: Symmetric 5 x 5 power. Skin: No rashes, lesions or ulcers Psychiatry: Judgement and insight appear normal. Mood & affect appropriate.     Data Reviewed: I have personally reviewed following labs and imaging studies  CBC:  Recent Labs Lab 09/11/16 1602 09/12/16 0643  WBC 8.6 7.7  NEUTROABS  --  7.0  HGB 10.0* 9.5*  HCT 31.7* 30.3*  MCV 86.6 85.8  PLT 234 171   Basic Metabolic Panel:  Recent Labs Lab 09/11/16 1602 09/12/16 0643  NA 141 138  K 4.8 4.5  CL 105 99*  CO2 26 28  GLUCOSE 181* 224*  BUN 18 20  CREATININE 1.13* 1.31*  CALCIUM 9.5 9.2   GFR: Estimated Creatinine Clearance: 39.9 mL/min (by C-G formula based on SCr of 1.31 mg/dL (H)). Liver Function Tests: No results for input(s): AST, ALT, ALKPHOS, BILITOT, PROT, ALBUMIN in the last 168 hours. No results for input(s): LIPASE, AMYLASE in the last 168 hours. No  results for input(s): AMMONIA in the last 168 hours. Coagulation Profile: No results for input(s): INR, PROTIME in the last 168 hours. Cardiac Enzymes:  Recent Labs Lab 09/12/16 0023 09/12/16 0643 09/12/16 1148  TROPONINI 0.05* 0.03* 0.05*   BNP (last 3 results) No results for input(s): PROBNP in the last 8760 hours. HbA1C: No results for input(s): HGBA1C in the last 72 hours. CBG:  Recent Labs Lab 09/11/16 2245 09/12/16 0600 09/12/16 1120  GLUCAP 216* 226* 137*   Lipid Profile: No results for input(s): CHOL, HDL, LDLCALC, TRIG, CHOLHDL, LDLDIRECT in the last 72 hours. Thyroid Function Tests:  Recent Labs  09/12/16 0023  TSH 1.297  FREET4 0.83   Anemia Panel: No results for input(s): VITAMINB12, FOLATE, FERRITIN, TIBC, IRON, RETICCTPCT in the last 72 hours. Sepsis Labs: No results for input(s): PROCALCITON, LATICACIDVEN in the last 168 hours.  No results found for this or any previous visit (from the past 240 hour(s)).       Radiology Studies: Dg Chest 2 View  Result Date: 09/11/2016 CLINICAL DATA:  Chest pain EXAM: CHEST  2 VIEW COMPARISON:  10/18/2015 FINDINGS: Cardiac enlargement. Congestive heart failure with edema and small pleural effusions. Edema has progressed since prior study. Bibasilar atelectasis. IMPRESSION: Congestive heart failure with pulmonary edema and small pleural effusions. Electronically Signed   By: Franchot Gallo M.D.   On: 09/11/2016 15:14        Scheduled Meds: . amiodarone  200 mg Oral Daily  . aspirin EC  81 mg Oral Daily  . diltiazem  180 mg Oral Daily  . doxazosin  4 mg Oral QHS  . enoxaparin (LOVENOX) injection  40 mg Subcutaneous Q24H  . ferrous sulfate  325 mg Oral Q breakfast  . furosemide  40 mg Intravenous BID  . [START ON 09/13/2016] Influenza vac split quadrivalent PF  0.5 mL Intramuscular Tomorrow-1000  . insulin aspart  0-15 Units Subcutaneous TID WC  . ipratropium-albuterol  3 mL Nebulization TID  .  levothyroxine  50 mcg Oral QAC breakfast  . metoprolol      . mometasone-formoterol  2 puff Inhalation BID  . pantoprazole  40 mg Oral Daily  . pravastatin  20 mg Oral Q supper  . sodium chloride flush  3 mL Intravenous Q12H   Continuous Infusions:   LOS: 1 day    Time spent: 28 minutes    Dron Tanna Furry, MD [facility] Pager [phone]  If 7PM-7AM, please contact night-coverage [email or web address] Password [email or web address] 09/12/2016, 1:34 PM

## 2016-09-12 NOTE — Progress Notes (Addendum)
Patient Name: Patricia [name] Date of Encounter: 09/12/2016  Primary Cardiologist: Dr Martinique  Hospital Problem List     Principal Problem:   Acute respiratory failure [facility]) Active Problems:   Essential hypertension   DM type 2 (diabetes mellitus, type 2) ([facility])   Hypothyroidism   COPD (chronic obstructive pulmonary disease) ([facility])   Hyperlipidemia   Anemia   Chronic diastolic CHF (congestive heart failure) ([facility])   Severe aortic stenosis     Subjective   Feels much better today.  Less SOB.    Inpatient Medications    Scheduled Meds: . amiodarone  200 mg Oral Daily  . aspirin EC  81 mg Oral Daily  . diltiazem  180 mg Oral Daily  . doxazosin  4 mg Oral QHS  . enoxaparin (LOVENOX) injection  40 mg Subcutaneous Q24H  . ferrous sulfate  325 mg Oral Q breakfast  . furosemide  40 mg Intravenous BID  . [START ON 09/13/2016] Influenza vac split quadrivalent PF  0.5 mL Intramuscular Tomorrow-1000  . insulin aspart  0-15 Units Subcutaneous TID WC  . ipratropium-albuterol  3 mL Nebulization TID  . levothyroxine  50 mcg Oral QAC breakfast  . mometasone-formoterol  2 puff Inhalation BID  . pantoprazole  40 mg Oral Daily  . pravastatin  20 mg Oral Q supper  . sodium chloride flush  3 mL Intravenous Q12H   Continuous Infusions:  PRN Meds: sodium chloride, acetaminophen, albuterol, ondansetron (ZOFRAN) IV, sodium chloride flush   Vital Signs    Vitals:   09/11/16 2109 09/11/16 2235 09/11/16 2349 09/12/16 0452  BP: 152/55 (!) 185/55  (!) 145/125  Pulse: 81 83  81  Resp: _0 Temp:  98.2 F (36.8 C)  97.4 F (36.3 C)  TempSrc:  Oral  Oral  SpO2: 92% 97% 98% 95%  Weight:  222 lb 1.6 oz (100.7 kg)  220 lb 4.8 oz (99.9 kg)  Height:  _1  (1.6 m)      Intake/Output Summary (Last 24 hours) at 09/12/16 1128 Last data filed at 09/12/16 0900  Gross per 24 hour  Intake             1400 ml  Output             2575 ml  Net            -1175 ml   Filed Weights   09/11/16 2235 09/12/16 0452  Weight: 222 lb 1.6 oz (100.7 kg) 220 lb 4.8 oz (99.9 kg)    Physical Exam    GEN: Well nourished, well developed, in no acute distress.  HEENT: Grossly normal.  Neck: Supple, no JVD, carotid bruits, or masses. Cardiac: RRR, no murmurs, rubs, or gallops. No clubbing, cyanosis, edema.  Radials/DP/PT 2+ and equal bilaterally.  Respiratory:  Respirations regular and unlabored, few crackles at bases GI: Soft, nontender, nondistended, BS + x 4. MS: no deformity or atrophy. Skin: warm and dry, no rash. Neuro:  Strength and sensation are intact. Psych: AAOx3.  Normal affect.  Labs    CBC  Recent Labs  09/11/16 1602 09/12/16 0643  WBC 8.6 7.7  NEUTROABS  --  7.0  HGB 10.0* 9.5*  HCT 31.7* 30.3*  MCV 86.6 85.8  PLT 234 144   Basic Metabolic Panel  Recent Labs  09/11/16 1602 09/12/16 0643  NA 141 138  K 4.8 4.5  CL 105 99*  CO2 26 28  GLUCOSE 181* 224*  BUN  18 20  CREATININE 1.13* 1.31*  CALCIUM 9.5 9.2   Liver Function Tests No results for input(s): AST, ALT, ALKPHOS, BILITOT, PROT, ALBUMIN in the last 72 hours. No results for input(s): LIPASE, AMYLASE in the last 72 hours. Cardiac Enzymes  Recent Labs  09/12/16 0023 09/12/16 0643  TROPONINI 0.05* 0.03*   BNP Invalid input(s): POCBNP D-Dimer No results for input(s): DDIMER in the last 72 hours. Hemoglobin A1C No results for input(s): HGBA1C in the last 72 hours. Fasting Lipid Panel No results for input(s): CHOL, HDL, LDLCALC, TRIG, CHOLHDL, LDLDIRECT in the last 72 hours. Thyroid Function Tests  Recent Labs  09/12/16 0023  TSH 1.297    Telemetry    NSR - Personally Reviewed  ECG    NSR - Personally Reviewed  Radiology    Dg Chest 2 View Result Date: 09/11/2016 CLINICAL DATA:  Chest pain EXAM: CHEST  2 VIEW COMPARISON:  10/18/2015 FINDINGS: Cardiac enlargement. Congestive heart failure with edema and small pleural effusions. Edema has progressed since prior study.  Bibasilar atelectasis. IMPRESSION: Congestive heart failure with pulmonary edema and small pleural effusions. Electronically Signed   By: Franchot Gallo M.D.   On: 09/11/2016 15:14    Cardiac Studies   Cardiac CT ordered  Patient Profile     80 year old female with a significant PMH of severe AS undergoing evaluation for transcatheter aortic implantation, PAF, OM3 75% at cath 11/07, pulmonary hypertension, HTN, DM, and anemia admitted 11/16 for shortness of breath.  Assessment & Plan    # Troponin elevation: - Likely demand ischemia due to hypertensive urgency, volume overload, aortic stenosis, with single vessel coronary artery disease.  - Patient is without chest pain.  Patient feels better at this time. - Continue to trend troponin level. - Continue aspirin at this time. - Would not start full dose anticoagulation unless patient has symptoms concerning for myocardial infarction.    # Aortic stenosis: - Patient with known severe aortic stenosis currently undergoing work up for TAVR.  Dyspnea likely related to aortic stenosis.  She would like to get her workup for TAVR done while in hospital. Will order Cardiac CT. - Continue diuresis for now. - Avoid vasodilator blood pressure medications in this patient  # Coronary artery disease: - Patient with known single vessel disease involving her 3rd obtuse marginal.  Undergoing TAVR work up at this time.  Low suspicion that her current symptoms are related to coronary artery disease.   # Hypertension - Blood pressure was substantially elevated with potential hypertensive urgency in the setting of aortic stenosis on admission.  She does have stage II chronic kidney disease.  She says that she had very bad white coat HTN. - Not many options for blood pressure control based on her aortic stenosis.  Restart home blood pressure medications first.  She just got her meds and will follow BP - Diuresis should help BP   # Persistent atrial  fibrillation s/p DCCV 09/2015 maintaining NSR.  She is not on anticoagulation due to history of anemia and heme + stools with no clear cut GI source.   Signed, Fransico Him, MD [facility] 09/12/2016

## 2016-09-13 ENCOUNTER — Inpatient Hospital Stay (HOSPITAL_COMMUNITY): Payer: Medicare Other

## 2016-09-13 DIAGNOSIS — R0602 Shortness of breath: Secondary | ICD-10-CM

## 2016-09-13 LAB — BASIC METABOLIC PANEL
Anion gap: 9 (ref 5–15)
BUN: 36 mg/dL — ABNORMAL HIGH (ref 6–20)
CO2: 30 mmol/L (ref 22–32)
Calcium: 8.9 mg/dL (ref 8.9–10.3)
Chloride: 99 mmol/L — ABNORMAL LOW (ref 101–111)
Creatinine, Ser: 1.44 mg/dL — ABNORMAL HIGH (ref 0.44–1.00)
GFR calc Af Amer: 39 mL/min — ABNORMAL LOW (ref >60)
GFR calc non Af Amer: 34 mL/min — ABNORMAL LOW (ref >60)
Glucose, Bld: 127 mg/dL — ABNORMAL HIGH (ref 65–99)
Potassium: 4.5 mmol/L (ref 3.5–5.1)
Sodium: 138 mmol/L (ref 135–145)

## 2016-09-13 LAB — BRAIN NATRIURETIC PEPTIDE: B Natriuretic Peptide: 263.7 pg/mL — ABNORMAL HIGH (ref 0.0–100.0)

## 2016-09-13 LAB — GLUCOSE, CAPILLARY
Glucose-Capillary: 123 mg/dL — ABNORMAL HIGH (ref 65–99)
Glucose-Capillary: 135 mg/dL — ABNORMAL HIGH (ref 65–99)
Glucose-Capillary: 137 mg/dL — ABNORMAL HIGH (ref 65–99)
Glucose-Capillary: 130 mg/dL — ABNORMAL HIGH (ref 65–99)

## 2016-09-13 MED ORDER — FUROSEMIDE 40 MG PO TABS
40.0000 mg | ORAL_TABLET | Freq: Every day | ORAL | Status: DC
  Administered 2016-09-14: 40 mg via ORAL
  Filled 2016-09-13: qty 1

## 2016-09-13 NOTE — Progress Notes (Signed)
SATURATION QUALIFICATIONS: (This note is used to comply with regulatory documentation for home oxygen)  Patient Saturations on Room Air at Rest = 94%  Patient Saturations on Room Air while Ambulating = 81%  Patient Saturations on 2 Liters of oxygen while Ambulating = 91%  Please briefly explain why patient needs home oxygen: Pt with desaturation to 81% on RA with activity and requires supplemental oxygen with mobility [employer], [place]

## 2016-09-13 NOTE — Progress Notes (Addendum)
PROGRESS NOTE    Patricia [name]  HQI:[id number] DOB: [birth year] DOA: 09/11/2016 PCP: Jeanmarie Hubert, MD   Brief Narrative: 80 y.o. female with medical history significant of severe AS (undergoing evaluation for likely TAVR), afib s/p cardioversion in 12/16, HTN, DM, HLD presenting with SOB, acute on chronic respiratory failure.  Assessment & Plan:   #Likely acute diastolic congestive heart failure with pulmonary edema:  -change IV lasix to oral because of mild elevation in creatinine level. Follow-up chest x-ray. Patient takes oral Lasix 40 mg every other day at home. Check BNP. -Further evaluation as per cardiologist. Undergoing evaluation for TAVR. Cardiac CT scan was done -On metoprolol.  #History of coronary artery disease, elevated troponin level likely in the setting of CHF. Patient has no chest pain. -Continue aspirin, metoprolol, pravastatin  #Severe aortic stenosis undergoing evaluation for possible surgical intervention. Follow-up cardiology team.  #  Acute respiratory failure with hypoxia: due to acute pulmonary edema seen on chest x-ray on admission. Also has aortic stenosis. Overnight events noted, patient was hypoxic to 85 in room air. Getting chest x-ray today. Currently on 2 L of oxygen. Try to wean off oxygen gradually.   # Essential hypertension: Monitor blood pressure. Continue current blood pressure medications.   # DM type 2 (diabetes mellitus, type 2) ([place]): Monitor blood sugar level. Continue sliding scale.   # Hypothyroidism: On Synthroid.    #Paroxysmal atrial fibrillation: Status post cardioversion in 2016 currently maintaining sinus rhythm. Not on anticoagulation because of history of anemia and heme positive stool with no clear GI source as per cardiology note.  #Acute on Chronic kidney disease stage III likely hemodynamically mediated: Monitor serum creatinine level. Change IV to oral lasix as above. Avoid nephrotoxins. Patient had urine output of 2500 mL.  Repeat BMP in the morning.  PT evaluation.  DVT prophylaxis: Lovenox subcutaneous Code Status: Full code Family Communication: No family present at bedside Disposition Plan: Likely discharge home in 1-2 days.    Consultants:   Cardiology  Procedures: None Antimicrobials: None  Subjective: Patient was seen and examined at bedside. Overnight events noted. Patient was sitting on bed comfortably. She was using 2 L of oxygen. Denied chest pain, shortness of breath, nausea or vomiting. Objective: Vitals:   09/13/16 0400 09/13/16 0722 09/13/16 1010 09/13/16 1145  BP:   (!) 144/46 (!) 145/50  Pulse:    80  Resp:      Temp:      TempSrc:      SpO2: 98% 98%  94%  Weight:      Height:        Intake/Output Summary (Last 24 hours) at 09/13/16 1146 Last data filed at 09/13/16 0832  Gross per 24 hour  Intake              960 ml  Output             1700 ml  Net             -740 ml   Filed Weights   09/11/16 2235 09/12/16 0452 09/13/16 0317  Weight: 100.7 kg (222 lb 1.6 oz) 99.9 kg (220 lb 4.8 oz) 99.7 kg (219 lb 12.8 oz)    Examination:  General exam: Not in distress. Respiratory system: Respiratory effort normal, no wheezing, clear bilateral. Cardiovascular system: S1 & S2 heard, RRR, systolic murmur heard. No pedal edema. Gastrointestinal system: Abdomen soft, nontender, non-distended. Bowel sound positive Central nervous system: Alert and oriented. No focal neurological deficits. Extremities:  Symmetric 5 x 5 power. Skin: No rashes, lesions or ulcers Psychiatry: Judgement and insight appear normal. Mood & affect appropriate.     Data Reviewed: I have personally reviewed following labs and imaging studies  CBC:  Recent Labs Lab 09/11/16 1602 09/12/16 0643  WBC 8.6 7.7  NEUTROABS  --  7.0  HGB 10.0* 9.5*  HCT 31.7* 30.3*  MCV 86.6 85.8  PLT 234 888   Basic Metabolic Panel:  Recent Labs Lab 09/11/16 1602 09/12/16 0643 09/13/16 0247  NA 141 138 138  K 4.8  4.5 4.5  CL 105 99* 99*  CO2 _0 GLUCOSE 181* 224* 127*  BUN 18 20 36*  CREATININE 1.13* 1.31* 1.44*  CALCIUM 9.5 9.2 8.9   GFR: Estimated Creatinine Clearance: 36.2 mL/min (by C-G formula based on SCr of 1.44 mg/dL (H)). Liver Function Tests: No results for input(s): AST, ALT, ALKPHOS, BILITOT, PROT, ALBUMIN in the last 168 hours. No results for input(s): LIPASE, AMYLASE in the last 168 hours. No results for input(s): AMMONIA in the last 168 hours. Coagulation Profile: No results for input(s): INR, PROTIME in the last 168 hours. Cardiac Enzymes:  Recent Labs Lab 09/12/16 0023 09/12/16 0643 09/12/16 1148  TROPONINI 0.05* 0.03* 0.05*   BNP (last 3 results) No results for input(s): PROBNP in the last 8760 hours. HbA1C: No results for input(s): HGBA1C in the last 72 hours. CBG:  Recent Labs Lab 09/12/16 1120 09/12/16 1611 09/12/16 2137 09/13/16 0609 09/13/16 1138  GLUCAP 137* 231* 144* 123* 135*   Lipid Profile: No results for input(s): CHOL, HDL, LDLCALC, TRIG, CHOLHDL, LDLDIRECT in the last 72 hours. Thyroid Function Tests:  Recent Labs  09/12/16 0023  TSH 1.297  FREET4 0.83   Anemia Panel: No results for input(s): VITAMINB12, FOLATE, FERRITIN, TIBC, IRON, RETICCTPCT in the last 72 hours. Sepsis Labs: No results for input(s): PROCALCITON, LATICACIDVEN in the last 168 hours.  No results found for this or any previous visit (from the past 240 hour(s)).       Radiology Studies: Dg Chest 2 View  Result Date: 09/11/2016 CLINICAL DATA:  Chest pain EXAM: CHEST  2 VIEW COMPARISON:  10/18/2015 FINDINGS: Cardiac enlargement. Congestive heart failure with edema and small pleural effusions. Edema has progressed since prior study. Bibasilar atelectasis. IMPRESSION: Congestive heart failure with pulmonary edema and small pleural effusions. Electronically Signed   By: Franchot Gallo M.D.   On: 09/11/2016 15:14   Ct Coronary Morph W/cta Cor W/score W/ca W/cm  &/or Wo/cm  Addendum Date: 09/12/2016   ADDENDUM REPORT: 09/12/2016 17:17 CLINICAL DATA:  80 year old female with severe aortic stenosis. EXAM: Cardiac TAVR CT TECHNIQUE: The patient was scanned on a Philips 256 scanner. A 120 kV retrospective scan was triggered in the descending thoracic aorta at 111 HU's. Gantry rotation speed was 270 msecs and collimation was .9 mm. 10 mg of iv metoprolol and no nitro were given. The 3D data set was reconstructed in 5% intervals of the R-R cycle. Systolic and diastolic phases were analyzed on a dedicated work station using MPR, MIP and VRT modes. The patient received 80 cc of contrast. FINDINGS: Aortic Valve: Trileaflet, moderately thickened, moderately calcified aortic valve with severely restricted leaflet opening. There are only mild calcifications extending into the LVOT. Aorta: Normal caliber. Mild diffuse calcifications in the sinotubular junction, aortic arch and descending aorta. No dissection. Sinotubular Junction:  25 x 23 mm Ascending Thoracic Aorta:  27 x 26 mm Aortic Arch:  21 x 21  mm Descending Thoracic Aorta:  24 x 24 mm Sinus of Valsalva Measurements: Non-coronary:  30 mm Right -coronary:  26 mm Left -coronary:  28 mm Coronary Artery Height above Annulus: Left Main:  9.5 mm Right Coronary:  15 mm Virtual Basal Annulus Measurements: Maximum/Minimum Diameter:  26 x 18 mm Perimeter:  86 mm Area:  356 mm2 Coronary Arteries: Normal origin, the study is insufficient for the coronary evaluation. Optimum Fluoroscopic Angle for Delivery:  RAO 3 CRA 3 Normal pulmonary vein drainage into the left atrium. No thrombus in the large left atrial appendage. IMPRESSION: 1. Trileaflet, moderately thickened, moderately calcified aortic valve with severely restricted leaflet opening and only mild calcifications extending into the LVOT. The annular measurements suitable for delivery of a 23 mm Edwards-SAPIEN TAVR 3 valve. 2.  Sufficient annular to coronary distance. 3. Optimum  Fluoroscopic Angle for Delivery:  RAO 3 CRA 3 Ena Dawley Electronically Signed   By: Ena Dawley   On: 09/12/2016 17:17   Result Date: 09/12/2016 EXAM: OVER-READ INTERPRETATION  CT CHEST The following report is an over-read performed by radiologist Dr. Collene Leyden [facility], [place] on 09/12/2016. This over-read does not include interpretation of cardiac or coronary anatomy or pathology. The coronary CTA interpretation by the cardiologist is attached. COMPARISON:  None. FINDINGS: No adenopathy in the visualized mediastinum or hila. Small bilateral pleural effusions noted. Dependent atelectasis in the right lower lobe. No acute findings in the visualized upper abdomen. Small low-density area in the right hepatic lobe likely reflects small cyst. Visualized chest wall soft tissues and bony structures unremarkable IMPRESSION: Small bilateral pleural effusions with dependent atelectasis. Electronically Signed: By: Rolm Baptise M.D. On: 09/12/2016 14:12        Scheduled Meds: . amiodarone  200 mg Oral Daily  . aspirin EC  81 mg Oral Daily  . diltiazem  180 mg Oral Daily  . doxazosin  4 mg Oral QHS  . enoxaparin (LOVENOX) injection  40 mg Subcutaneous Q24H  . ferrous sulfate  325 mg Oral Q breakfast  . Influenza vac split quadrivalent PF  0.5 mL Intramuscular Tomorrow-1000  . insulin aspart  0-15 Units Subcutaneous TID WC  . ipratropium-albuterol  3 mL Nebulization TID  . levothyroxine  50 mcg Oral QAC breakfast  . mometasone-formoterol  2 puff Inhalation BID  . pantoprazole  40 mg Oral Daily  . pravastatin  20 mg Oral Q supper  . sodium chloride flush  3 mL Intravenous Q12H   Continuous Infusions:   LOS: 2 days    Time spent: 26 minutes    Reagen Goates Tanna Furry, MD [facility] Pager [phone]  If 7PM-7AM, please contact night-coverage [email or web address] Password [email or web address] 09/13/2016, 11:46 AM

## 2016-09-13 NOTE — Progress Notes (Signed)
After patient went to bathroom she was DOE 02 sats  85% on both hands and different fingers. 02 applied at 2 l/m n.c. And 02 sats back up mid. 90% resp. Now even and unlab.

## 2016-09-13 NOTE — Progress Notes (Signed)
Patient Name: Patricia [name] Date of Encounter: 09/13/2016  Primary Cardiologist: Dr Martinique  Hospital Problem List     Principal Problem:   Acute respiratory failure [facility]) Active Problems:   Essential hypertension   DM type 2 (diabetes mellitus, type 2) (HCC)   Hypothyroidism   COPD (chronic obstructive pulmonary disease) (HCC)   Hyperlipidemia   Anemia   Chronic diastolic CHF (congestive heart failure) (HCC)   Severe aortic stenosis     Subjective   Feels much better today.  Less SOB.    Inpatient Medications    Scheduled Meds: . amiodarone  200 mg Oral Daily  . aspirin EC  81 mg Oral Daily  . diltiazem  180 mg Oral Daily  . doxazosin  4 mg Oral QHS  . enoxaparin (LOVENOX) injection  40 mg Subcutaneous Q24H  . ferrous sulfate  325 mg Oral Q breakfast  . [START ON 09/14/2016] furosemide  40 mg Oral Daily  . insulin aspart  0-15 Units Subcutaneous TID WC  . ipratropium-albuterol  3 mL Nebulization TID  . levothyroxine  50 mcg Oral QAC breakfast  . mometasone-formoterol  2 puff Inhalation BID  . pantoprazole  40 mg Oral Daily  . pravastatin  20 mg Oral Q supper  . sodium chloride flush  3 mL Intravenous Q12H   Continuous Infusions:  PRN Meds: sodium chloride, acetaminophen, albuterol, metoprolol, ondansetron (ZOFRAN) IV, sodium chloride flush   Vital Signs    Vitals:   09/13/16 1010 09/13/16 1145 09/13/16 1313 09/13/16 1322  BP: (!) 144/46 (!) 145/50  116/66  Pulse:  80  61  Resp:      Temp:    97.9 F (36.6 C)  TempSrc:    Oral  SpO2:  94% 96% 99%  Weight:      Height:        Intake/Output Summary (Last 24 hours) at 09/13/16 1341 Last data filed at 09/13/16 1300  Gross per 24 hour  Intake             1200 ml  Output             1100 ml  Net              100 ml   Filed Weights   09/11/16 2235 09/12/16 0452 09/13/16 0317  Weight: 222 lb 1.6 oz (100.7 kg) 220 lb 4.8 oz (99.9 kg) 219 lb 12.8 oz (99.7 kg)    Physical Exam    GEN: Well  nourished, well developed, in no acute distress.  HEENT: Grossly normal.  Neck: Supple, no JVD, carotid bruits, or masses. Cardiac: RRR, no murmurs, rubs, or gallops. No clubbing, cyanosis, edema.  Radials/DP/PT 2+ and equal bilaterally.  Respiratory:  Respirations regular and unlabored, few crackles at bases GI: Soft, nontender, nondistended, BS + x 4. MS: no deformity or atrophy. Skin: warm and dry, no rash. Neuro:  Strength and sensation are intact. Psych: AAOx3.  Normal affect.  Labs    CBC  Recent Labs  09/11/16 1602 09/12/16 0643  WBC 8.6 7.7  NEUTROABS  --  7.0  HGB 10.0* 9.5*  HCT 31.7* 30.3*  MCV 86.6 85.8  PLT 234 295   Basic Metabolic Panel  Recent Labs  09/12/16 0643 09/13/16 0247  NA 138 138  K 4.5 4.5  CL 99* 99*  CO2 28 30  GLUCOSE 224* 127*  BUN 20 36*  CREATININE 1.31* 1.44*  CALCIUM 9.2 8.9   Liver Function Tests No  results for input(s): AST, ALT, ALKPHOS, BILITOT, PROT, ALBUMIN in the last 72 hours. No results for input(s): LIPASE, AMYLASE in the last 72 hours. Cardiac Enzymes  Recent Labs  09/12/16 0023 09/12/16 0643 09/12/16 1148  TROPONINI 0.05* 0.03* 0.05*   BNP Invalid input(s): POCBNP D-Dimer No results for input(s): DDIMER in the last 72 hours. Hemoglobin A1C No results for input(s): HGBA1C in the last 72 hours. Fasting Lipid Panel No results for input(s): CHOL, HDL, LDLCALC, TRIG, CHOLHDL, LDLDIRECT in the last 72 hours. Thyroid Function Tests  Recent Labs  09/12/16 0023  TSH 1.297    Telemetry    NSR - Personally Reviewed  ECG    NSR - Personally Reviewed  Radiology    Dg Chest 2 View Result Date: 09/11/2016 CLINICAL DATA:  Chest pain EXAM: CHEST  2 VIEW COMPARISON:  10/18/2015 FINDINGS: Cardiac enlargement. Congestive heart failure with edema and small pleural effusions. Edema has progressed since prior study. Bibasilar atelectasis. IMPRESSION: Congestive heart failure with pulmonary edema and small pleural  effusions. Electronically Signed   By: Franchot Gallo M.D.   On: 09/11/2016 15:14    Cardiac Studies   FINDINGS: Aortic Valve: Trileaflet, moderately thickened, moderately calcified aortic valve with severely restricted leaflet opening. There are only mild calcifications extending into the LVOT.  Aorta: Normal caliber. Mild diffuse calcifications in the sinotubular junction, aortic arch and descending aorta. No dissection.  Sinotubular Junction:  25 x 23 mm  Ascending Thoracic Aorta:  27 x 26 mm  Aortic Arch:  21 x 21 mm  Descending Thoracic Aorta:  24 x 24 mm  Sinus of Valsalva Measurements:  Non-coronary:  30 mm  Right -coronary:  26 mm  Left -coronary:  28 mm  Coronary Artery Height above Annulus:  Left Main:  9.5 mm  Right Coronary:  15 mm  Virtual Basal Annulus Measurements:  Maximum/Minimum Diameter:  26 x 18 mm  Perimeter:  86 mm  Area:  356 mm2  Coronary Arteries: Normal origin, the study is insufficient for the coronary evaluation.  Optimum Fluoroscopic Angle for Delivery:  RAO 3 CRA 3  Normal pulmonary vein drainage into the left atrium.  No thrombus in the large left atrial appendage.  IMPRESSION: 1. Trileaflet, moderately thickened, moderately calcified aortic valve with severely restricted leaflet opening and only mild calcifications extending into the LVOT. The annular measurements suitable for delivery of a 23 mm Edwards-SAPIEN TAVR 3 valve.  2.  Sufficient annular to coronary distance.  3. Optimum Fluoroscopic Angle for Delivery:  RAO 3 CRA 3  Patient Profile     80 year old female with a significant PMH of severe AS undergoing evaluation for transcatheter aortic implantation, PAF, OM3 75% at cath 11/07, pulmonary hypertension, HTN, DM, and anemia admitted 11/16 for shortness of breath.  Assessment & Plan    # Troponin elevation: - Likely demand ischemia due to hypertensive urgency, volume overload, aortic  stenosis, with single vessel coronary artery disease.  - Patient is without chest pain.  Patient feels better at this time. - Minimal elevation and flat trend in troponin c/w demand ischemia - Continue aspirin at this time.  # Aortic stenosis: - Patient with known severe aortic stenosis currently undergoing work up for TAVR.  Dyspnea likely related to aortic stenosis.  She would like to get her workup for TAVR done while in hospital. Cardiac CT done and recommended 3m Edwards-Sapien TAVR 3 valve - creatinine bumped so will hold on further diuresis today.  Cxray shows  improved edema from prior cxray. Restart home dose of Lasix tomorrow.  - Avoid vasodilator blood pressure medications in this patient  # Coronary artery disease: - Patient with known single vessel disease involving her 3rd obtuse marginal.  Undergoing TAVR work up at this time.  Low suspicion that her current symptoms are related to coronary artery disease.   # Hypertension - Blood pressure was substantially elevated with potential hypertensive urgency in the setting of aortic stenosis on admission.  She does have stage II chronic kidney disease.  She says that she had very bad white coat HTN. - Not many options for blood pressure control based on her aortic stenosis.  BP improved after restarting home BP meds.   # Persistent atrial fibrillation s/p DCCV 09/2015 maintaining NSR.  She is not on anticoagulation due to history of anemia and heme + stools with no clear cut GI source.   # Acute on CKD stage 3 - creatinine bumped with IV contrast dye.  Hold diuretics today.  Repeat BMET in am.    Signed, Fransico Him, MD [facility] 09/12/2016

## 2016-09-13 NOTE — Evaluation (Signed)
Physical Therapy Evaluation/ Discharge/ Pre-TAVR assessment Patient Details Name: Patricia [name] MRN: [id number] DOB: 26-Apr-1936 Today's Date: 09/13/2016   History of Present Illness  80 yo admitted with respiratory failure due to volume overload and CHF. PMHx: severe aortic stenosis, afib, HTN, DM, asthma, bil knee arthritis, right TSA  Clinical Impression  Pt very pleasant and moving well however does fatigue with gait and noted to have supplemental oxygen demand with mobility. Pt able to perform all transfers and gait without assist, has cane at home and recommended use for stability due to arthritic and painful knees. Pt educated for energy conservation with bathing, dressing and sitting for these activities as well as staggering activities with rest breaks throughout the day. Pt at baseline functional level limited by AS and fatigue with all education completed. Recommend daily hall ambulation with nursing supervision acutely.  Pt walked from chair to bathroom on RA with drop in sats from 94% to 81%, with seated rest only able to recover to 88% and with supplemental O2 at 2L able to regain 95%  Pre-TAVR assessment Clinical frailty = 4 Pre vitals: 152/53, HR 63, sats 94% RA Post vitals: 145/50, HR 80, sats 91% on 2L Modified Borg Dyspnea with gait 4 on 2L Borg RPE 13  5 meter walk 7.66, 8.10, 9.6= 8.45 6 min walk test pt able to complete 450' in just over 3.5 min but could not continue walking for the entire 6 min    Follow Up Recommendations No PT follow up    Equipment Recommendations  Other (comment) (shower seat)    Recommendations for Other Services       Precautions / Restrictions Precautions Precautions: Fall Precaution Comments: watch sats Restrictions Weight Bearing Restrictions: No      Mobility  Bed Mobility               General bed mobility comments: pt in chair on arrival  Transfers Overall transfer level: Modified independent                   Ambulation/Gait Ambulation/Gait assistance: Modified independent (Device/Increase time) Ambulation Distance (Feet): 450 Feet Assistive device: None Gait Pattern/deviations: WFL(Within Functional Limits)   Gait velocity interpretation: at or above normal speed for age/gender General Gait Details: pt with initial reaching for rail with gait stating she runs into doorways at home at times but then able to complete long hall ambulation without LOB or deficit. However, fatigued by end of gait and unable to complete full 6 min walk   Stairs            Wheelchair Mobility    Modified Rankin (Stroke Patients Only)       Balance Overall balance assessment: Needs assistance   Sitting balance-Leahy Scale: Normal       Standing balance-Leahy Scale: Good                               Pertinent Vitals/Pain Pain Assessment: No/denies pain    Home Living Family/patient expects to be discharged to:: Private residence Living Arrangements: Alone Available Help at Discharge: Available PRN/intermittently;Family Type of Home: House Home Access: Stairs to enter   [employer] of Steps: 1 Home Layout: One level Home Equipment: Cane - single point;Grab bars - tub/shower;Hand held shower head Additional Comments: daughter can help when needed    Prior Function Level of Independence: Independent         Comments: still  drives     Hand Dominance        Extremity/Trunk Assessment   Upper Extremity Assessment: Overall WFL for tasks assessed           Lower Extremity Assessment: Generalized weakness      Cervical / Trunk Assessment: Kyphotic  Communication   Communication: No difficulties  Cognition Arousal/Alertness: Awake/alert Behavior During Therapy: WFL for tasks assessed/performed Overall Cognitive Status: Within Functional Limits for tasks assessed                      General Comments      Exercises      Assessment/Plan    PT Assessment Patent does not need any further PT services  PT Problem List            PT Treatment Interventions      PT Goals (Current goals can be found in the Care Plan section)  Acute Rehab PT Goals PT Goal Formulation: All assessment and education complete, DC therapy    Frequency     Barriers to discharge        Co-evaluation               End of Session Equipment Utilized During Treatment: Gait belt Activity Tolerance: Patient tolerated treatment well Patient left: in chair;with call bell/phone within reach Nurse Communication: Mobility status         Time: 1155-2080 PT Time Calculation (min) (ACUTE ONLY): 28 min   Charges:   PT Evaluation $PT Eval Moderate Complexity: 1 Procedure PT Treatments $Self Care/Home Management: 8-22   PT G Codes:        Marializ Ferrebee B Zakyria Metzinger 2016/09/17, 11:51 AM  Elwyn Reach, [place]

## 2016-09-14 ENCOUNTER — Inpatient Hospital Stay (HOSPITAL_COMMUNITY): Payer: Medicare Other

## 2016-09-14 DIAGNOSIS — J9601 Acute respiratory failure with hypoxia: Secondary | ICD-10-CM

## 2016-09-14 DIAGNOSIS — Z01810 Encounter for preprocedural cardiovascular examination: Secondary | ICD-10-CM

## 2016-09-14 DIAGNOSIS — E7800 Pure hypercholesterolemia, unspecified: Secondary | ICD-10-CM

## 2016-09-14 DIAGNOSIS — J9621 Acute and chronic respiratory failure with hypoxia: Secondary | ICD-10-CM

## 2016-09-14 LAB — GLUCOSE, CAPILLARY
Glucose-Capillary: 128 mg/dL — ABNORMAL HIGH (ref 65–99)
Glucose-Capillary: 174 mg/dL — ABNORMAL HIGH (ref 65–99)

## 2016-09-14 LAB — BASIC METABOLIC PANEL
Anion gap: 11 (ref 5–15)
BUN: 31 mg/dL — ABNORMAL HIGH (ref 6–20)
CO2: 27 mmol/L (ref 22–32)
Calcium: 9.2 mg/dL (ref 8.9–10.3)
Chloride: 101 mmol/L (ref 101–111)
Creatinine, Ser: 1.39 mg/dL — ABNORMAL HIGH (ref 0.44–1.00)
GFR calc Af Amer: 41 mL/min — ABNORMAL LOW (ref >60)
GFR calc non Af Amer: 35 mL/min — ABNORMAL LOW (ref >60)
Glucose, Bld: 129 mg/dL — ABNORMAL HIGH (ref 65–99)
Potassium: 4.5 mmol/L (ref 3.5–5.1)
Sodium: 139 mmol/L (ref 135–145)

## 2016-09-14 MED ORDER — FUROSEMIDE 40 MG PO TABS: 40.0000 mg | ORAL_TABLET | Freq: Every day | ORAL | 0 refills | Status: DC

## 2016-09-14 MED ORDER — ASPIRIN 81 MG PO TBEC: 81.0000 mg | DELAYED_RELEASE_TABLET | Freq: Every day | ORAL | 0 refills | Status: DC

## 2016-09-14 NOTE — Discharge Summary (Addendum)
Physician Discharge Summary  Patricia [name] VFI:[id number] DOB: 11/18/1935 DOA: 09/11/2016  PCP: Jeanmarie Hubert, MD  Admit date: 09/11/2016 Discharge date: 09/14/2016  Admitted From:home Disposition:home  Recommendations for Outpatient Follow-up:  1. Follow up with PCP in 1-2 weeks 2. Please obtain BMP/CBC in one week   Home Health:no Equipment/Devices:oxygen 2 liters Discharge Condition:stable CODE STATUS:full Diet recommendation:heart healthy  Brief/Interim Summary:80 y.o.femalewith medical history significant of severe AS (undergoing evaluation for likely TAVR), afib s/p cardioversion in 12/16, HTN, DM, HLD presenting with SOB, acute on chronic respiratory failure.  Please see below for detail:  #Likely acute diastolic congestive heart failure with pulmonary edema: Treated with IV Lasix with clinical improvement. Resume oral Lasix. I advised patient to follow-up with her cardiologist as an outpatient. Educated on low salt diet. - Undergoing evaluation for TAVR. Cardiac CT scan and carotid ultrasound was done in the hospital.  -On metoprolol.  #History of coronary artery disease, elevated troponin level likely in the setting of CHF. Patient has no chest pain. -Continue aspirin, metoprolol, pravastatin  #Severe aortic stenosis undergoing evaluation for possible surgical intervention.  patient is undergoing. Testing for the surgery. Advised to keep follow-up appointment.  #  Acute respiratory failure with hypoxia: due to acute pulmonary edema seen on chest x-ray on admission. Also has aortic stenosis. Unable to wean off oxygen. Plan to discharge with 2 L of oxygen with outpatient follow-up.  # Essential hypertension: Monitor blood pressure. Continue current blood pressure medications.   # DM type 2 (diabetes mellitus, type 2) ([place]): Monitor blood sugar level. Continue home medicine and advised outpatient follow-up.   # Hypothyroidism: On Synthroid.    #Paroxysmal atrial  fibrillation: Status post cardioversion in 2016 currently maintaining sinus rhythm. Not on anticoagulation because of history of anemia and heme positive stool with no clear GI source as per cardiology note.  #Acute on Chronic kidney disease stage III likely hemodynamically mediated: Serum creatinine level is stable today. On oral lasix. Patient verbalized understanding. Also advised to check BMP in one week.   Patient is medically stable on discharge. Denied chest pain, shortness of breath, nausea or vomiting. She is eager to go home today.  Discharge Diagnoses:  Principal Problem:   Acute respiratory failure ([place]) Active Problems:   Essential hypertension   DM type 2 (diabetes mellitus, type 2) (HCC)   Hypothyroidism   COPD (chronic obstructive pulmonary disease) (HCC)   Hyperlipidemia   Anemia   Chronic diastolic CHF (congestive heart failure) (HCC)   Severe aortic stenosis   SOB (shortness of breath)    Discharge Instructions  Discharge Instructions    (HEART FAILURE PATIENTS) Call MD:  Anytime you have any of the following symptoms: 1) 3 pound weight gain in 24 hours or 5 pounds in 1 week 2) shortness of breath, with or without a dry hacking cough 3) swelling in the hands, feet or stomach 4) if you have to sleep on extra pillows at night in order to breathe.    Complete by:  As directed    Call MD for:  difficulty breathing, headache or visual disturbances    Complete by:  As directed    Call MD for:  extreme fatigue    Complete by:  As directed    Call MD for:  hives    Complete by:  As directed    Call MD for:  persistant dizziness or light-headedness    Complete by:  As directed    Call MD for:  persistant  nausea and vomiting    Complete by:  As directed    Call MD for:  severe uncontrolled pain    Complete by:  As directed    Call MD for:  temperature >100.4    Complete by:  As directed    Diet - low sodium heart healthy    Complete by:  As directed    For home  use only DME oxygen    Complete by:  As directed    Liters per Minute:  2   Frequency:  Continuous (stationary and portable oxygen unit needed)   Oxygen conserving device:  Yes   Oxygen delivery system:  Gas   Increase activity slowly    Complete by:  As directed        Medication List    TAKE these medications   acetaminophen 500 MG tablet Commonly known as:  TYLENOL Take 500 mg by mouth every 6 (six) hours as needed for mild pain.   ADVAIR DISKUS 100-50 MCG/DOSE Aepb Generic drug:  Fluticasone-Salmeterol Inhale 1 inhalation two  times daily for breathing What changed:  See the new instructions.   albuterol (2.5 MG/3ML) 0.083% nebulizer solution Commonly known as:  PROVENTIL Take 2.5 mg by nebulization every 6 (six) hours as needed for wheezing or shortness of breath.   albuterol 108 (90 Base) MCG/ACT inhaler Commonly known as:  PROVENTIL HFA;VENTOLIN HFA Inhale 2 puffs into the lungs every 6 (six) hours as needed for wheezing or shortness of breath.   amiodarone 200 MG tablet Commonly known as:  PACERONE Take one tablet daily by mouth What changed:  how much to take  how to take this  when to take this  additional instructions   aspirin 81 MG EC tablet Take 1 tablet (81 mg total) by mouth daily.   cetirizine 10 MG tablet Commonly known as:  ZYRTEC Take 10 mg by mouth daily as needed for allergies.   cholecalciferol 1000 units tablet Commonly known as:  VITAMIN D One daily for vitamin D supplement What changed:  how much to take  how to take this  when to take this  additional instructions   COMFORT LANCETS Misc Use daily to get blood for glucometer   diltiazem 180 MG 24 hr capsule Commonly known as:  CARDIZEM CD Take 1 capsule by mouth  once a day to control blood pressure What changed:  how much to take  how to take this  when to take this  additional instructions   doxazosin 4 MG tablet Commonly known as:  CARDURA TAKE 1 TABLET BY  MOUTH ONCE DAILY TO HELP CONTROL BLOOD PRESSURE What changed:  See the new instructions.   ferrous sulfate 325 (65 FE) MG EC tablet Take 1 tablet (325 mg total) by mouth daily with breakfast.   furosemide 40 MG tablet Commonly known as:  LASIX Take 1 tablet (40 mg total) by mouth daily. Start taking on:  09/16/2016 What changed:  when to take this   glimepiride 2 MG tablet Commonly known as:  AMARYL Take 1 tablet by mouth at  breakfast to control  Glucose What changed:  See the new instructions.   glucose blood test strip Commonly known as:  BAYER CONTOUR TEST Use daily too check glucose   levothyroxine 50 MCG tablet Commonly known as:  SYNTHROID, LEVOTHROID Take 1 tablet by mouth  daily for thyroid  supplement What changed:  See the new instructions.   metFORMIN 500 MG tablet Commonly known as:  GLUCOPHAGE Take 500 mg by mouth 2 (two) times daily with a meal.   pantoprazole 40 MG tablet Commonly known as:  PROTONIX Take 1 tablet (40 mg total) by mouth daily.   potassium gluconate 595 (99 K) MG Tabs tablet Take 595 mg by mouth every other day. TAKES ONLY WHEN USING FUROSEMIDE   pravastatin 20 MG tablet Commonly known as:  PRAVACHOL TAKE 1 TABLET BY MOUTH  DAILY What changed:  how much to take  when to take this   triamcinolone 0.025 % cream Commonly known as:  KENALOG Apply 1 application topically daily as needed (psoriasis).            Durable Medical Equipment        Start     Ordered   09/14/16 0000  For home use only DME oxygen    Question Answer Comment  Liters per Minute 2   Frequency Continuous (stationary and portable oxygen unit needed)   Oxygen conserving device Yes   Oxygen delivery system Gas      09/14/16 1059     Follow-up Information    Jeanmarie Hubert, MD. Schedule an appointment as soon as possible for a visit in 1 week(s).   Specialty:  Internal Medicine Contact information: [place]  [address] [phone]        [name] Martinique, MD. Schedule an appointment as soon as possible for a visit in 2 week(s).   Specialty:  Cardiology Contact information: [address] [place] [address] [phone]          Allergies  Allergen Reactions  . Codeine Other (See Comments)    "spaces her out, dizziness, can't walk well"  . Vesicare [Solifenacin] Itching    Consultations: Cardiologist  Procedures/Studies: CT scan heart, carotid ultrasound.   Subjective: Patient was seen and examined at bedside. Patient is eager to go home today and requesting to discharge today. She reported feeling much better. Denied headache, dizziness, nausea, vomiting, chest pain or shortness of breath.   Discharge Exam: Vitals:   09/13/16 2019 09/14/16 0407  BP: (!) 161/56 (!) 152/46  Pulse: 61 (!) 58  Resp: 20 20  Temp: 98.2 F (36.8 C) 97.8 F (36.6 C)   Vitals:   09/13/16 2046 09/14/16 0407 09/14/16 0829 09/14/16 0830  BP:  (!) 152/46    Pulse:  (!) 58    Resp:  20    Temp:  97.8 F (36.6 C)    TempSrc:  Oral    SpO2: 95% 98% 95% 95%  Weight:  100.2 kg (221 lb)    Height:        General: Pt is alert, awake, not in acute distress Cardiovascular: RRR, Z5/G3 +, Has systolic murmur, no rubs, no gallops Respiratory: CTA bilaterally, no wheezing, no rhonchi Abdominal: Soft, NT, ND, bowel sounds + Extremities: no edema, no cyanosis    The results of significant diagnostics from this hospitalization (including imaging, microbiology, ancillary and laboratory) are listed below for reference.     Microbiology: No results found for this or any previous visit (from the past 240 hour(s)).   Labs: BNP (last 3 results)  Recent Labs  10/18/15 1800 09/11/16 1602 09/13/16 0247  BNP 436.0* 396.9* 875.6*   Basic Metabolic Panel:  Recent Labs Lab 09/11/16 1602 09/12/16 0643 09/13/16 0247 09/14/16 0338  NA 141 138 138 139  K 4.8 4.5 4.5 4.5  CL 105 99* 99*  101  CO2 _0 27  GLUCOSE 181* 224* 127* 129*  BUN 18 20 36* 31*  CREATININE 1.13* 1.31* 1.44* 1.39*  CALCIUM 9.5 9.2 8.9 9.2   Liver Function Tests: No results for input(s): AST, ALT, ALKPHOS, BILITOT, PROT, ALBUMIN in the last 168 hours. No results for input(s): LIPASE, AMYLASE in the last 168 hours. No results for input(s): AMMONIA in the last 168 hours. CBC:  Recent Labs Lab 09/11/16 1602 09/12/16 0643  WBC 8.6 7.7  NEUTROABS  --  7.0  HGB 10.0* 9.5*  HCT 31.7* 30.3*  MCV 86.6 85.8  PLT 234 231   Cardiac Enzymes:  Recent Labs Lab 09/12/16 0023 09/12/16 0643 09/12/16 1148  TROPONINI 0.05* 0.03* 0.05*   BNP: Invalid input(s): POCBNP CBG:  Recent Labs Lab 09/13/16 0609 09/13/16 1138 09/13/16 1639 09/13/16 2051 09/14/16 0618  GLUCAP 123* 135* 137* 130* 128*   D-Dimer No results for input(s): DDIMER in the last 72 hours. Hgb A1c No results for input(s): HGBA1C in the last 72 hours. Lipid Profile No results for input(s): CHOL, HDL, LDLCALC, TRIG, CHOLHDL, LDLDIRECT in the last 72 hours. Thyroid function studies  Recent Labs  09/12/16 0023  TSH 1.297   Anemia work up No results for input(s): VITAMINB12, FOLATE, FERRITIN, TIBC, IRON, RETICCTPCT in the last 72 hours. Urinalysis    Component Value Date/Time   BILIRUBINUR negative 07/18/2015 1337   BILIRUBINUR small 10/07/2012 1544   KETONESUR negative 07/18/2015 1337   PROTEINUR trace (A) 07/18/2015 1337   PROTEINUR >=300 10/07/2012 1544   UROBILINOGEN 0.2 07/18/2015 1337   NITRITE Negative 07/18/2015 1337   NITRITE negative 10/07/2012 1544   LEUKOCYTESUR Negative 07/18/2015 1337   Sepsis Labs Invalid input(s): PROCALCITONIN,  WBC,  LACTICIDVEN Microbiology No results found for this or any previous visit (from the past 240 hour(s)).   Time coordinating discharge: Over 30 minutes  SIGNED:   Rosita Fire, MD  [facility] 09/14/2016, 10:59 AM  If 7PM-7AM, please  contact night-coverage [email or web address] Password [email or web address]

## 2016-09-14 NOTE — Progress Notes (Signed)
Patient discharged teaching given including activity, diet, follow-up appointments and medication. Patient verbalized understanding of all discharge instructions. IV access was dc'd. Vitals are stable. Skin is intact. Pt to be escorted out by RN, to be driven home by grandson.

## 2016-09-14 NOTE — Progress Notes (Signed)
VASCULAR LAB PRELIMINARY  PRELIMINARY  PRELIMINARY  PRELIMINARY  Carotid duplex completed.    Preliminary report:  1-39% ICA plaquing.  Vertebral artery flow is antegrade.   Kimani Hovis, RVT 09/14/2016, 10:23 AM

## 2016-09-14 NOTE — Progress Notes (Addendum)
Patient Name: Patricia [name] Date of Encounter: 09/14/2016  Primary Cardiologist: Dr Martinique  Hospital Problem List     Principal Problem:   Acute respiratory failure [facility]) Active Problems:   Essential hypertension   DM type 2 (diabetes mellitus, type 2) (HCC)   Hypothyroidism   COPD (chronic obstructive pulmonary disease) (HCC)   Hyperlipidemia   Anemia   Chronic diastolic CHF (congestive heart failure) (HCC)   Severe aortic stenosis   SOB (shortness of breath)     Subjective   Feels much better today.  Less SOB.    Inpatient Medications    Scheduled Meds: . amiodarone  200 mg Oral Daily  . aspirin EC  81 mg Oral Daily  . diltiazem  180 mg Oral Daily  . doxazosin  4 mg Oral QHS  . enoxaparin (LOVENOX) injection  40 mg Subcutaneous Q24H  . ferrous sulfate  325 mg Oral Q breakfast  . furosemide  40 mg Oral Daily  . insulin aspart  0-15 Units Subcutaneous TID WC  . ipratropium-albuterol  3 mL Nebulization TID  . levothyroxine  50 mcg Oral QAC breakfast  . mometasone-formoterol  2 puff Inhalation BID  . pantoprazole  40 mg Oral Daily  . pravastatin  20 mg Oral Q supper  . sodium chloride flush  3 mL Intravenous Q12H   Continuous Infusions:  PRN Meds: sodium chloride, acetaminophen, albuterol, metoprolol, ondansetron (ZOFRAN) IV, sodium chloride flush   Vital Signs    Vitals:   09/13/16 2046 09/14/16 0407 09/14/16 0829 09/14/16 0830  BP:  (!) 152/46    Pulse:  (!) 58    Resp:  20    Temp:  97.8 F (36.6 C)    TempSrc:  Oral    SpO2: 95% 98% 95% 95%  Weight:  221 lb (100.2 kg)    Height:        Intake/Output Summary (Last 24 hours) at 09/14/16 1046 Last data filed at 09/13/16 1300  Gross per 24 hour  Intake              240 ml  Output                0 ml  Net              240 ml   Filed Weights   09/12/16 0452 09/13/16 0317 09/14/16 0407  Weight: 220 lb 4.8 oz (99.9 kg) 219 lb 12.8 oz (99.7 kg) 221 lb (100.2 kg)    Physical Exam    GEN: Well  nourished, well developed, in no acute distress.  HEENT: Grossly normal.  Neck: Supple, no JVD, carotid bruits, or masses. Cardiac: RRR, no murmurs, rubs, or gallops. No clubbing, cyanosis, edema.  Radials/DP/PT 2+ and equal bilaterally.  Respiratory:  Few crackles at bases that clear with cough GI: Soft, nontender, nondistended, BS + x 4. MS: no deformity or atrophy. Skin: warm and dry, no rash. Neuro:  Strength and sensation are intact. Psych: AAOx3.  Normal affect.  Labs    CBC  Recent Labs  09/11/16 1602 09/12/16 0643  WBC 8.6 7.7  NEUTROABS  --  7.0  HGB 10.0* 9.5*  HCT 31.7* 30.3*  MCV 86.6 85.8  PLT 234 594   Basic Metabolic Panel  Recent Labs  09/13/16 0247 09/14/16 0338  NA 138 139  K 4.5 4.5  CL 99* 101  CO2 30 27  GLUCOSE 127* 129*  BUN 36* 31*  CREATININE 1.44* 1.39*  CALCIUM  8.9 9.2   Liver Function Tests No results for input(s): AST, ALT, ALKPHOS, BILITOT, PROT, ALBUMIN in the last 72 hours. No results for input(s): LIPASE, AMYLASE in the last 72 hours. Cardiac Enzymes  Recent Labs  09/12/16 0023 09/12/16 0643 09/12/16 1148  TROPONINI 0.05* 0.03* 0.05*   BNP Invalid input(s): POCBNP D-Dimer No results for input(s): DDIMER in the last 72 hours. Hemoglobin A1C No results for input(s): HGBA1C in the last 72 hours. Fasting Lipid Panel No results for input(s): CHOL, HDL, LDLCALC, TRIG, CHOLHDL, LDLDIRECT in the last 72 hours. Thyroid Function Tests  Recent Labs  09/12/16 0023  TSH 1.297    Telemetry    NSR - Personally Reviewed  ECG    NSR - Personally Reviewed  Radiology    Dg Chest 2 View Result Date: 09/11/2016 CLINICAL DATA:  Chest pain EXAM: CHEST  2 VIEW COMPARISON:  10/18/2015 FINDINGS: Cardiac enlargement. Congestive heart failure with edema and small pleural effusions. Edema has progressed since prior study. Bibasilar atelectasis. IMPRESSION: Congestive heart failure with pulmonary edema and small pleural effusions.  Electronically Signed   By: Franchot Gallo M.D.   On: 09/11/2016 15:14    Cardiac Studies   FINDINGS: Aortic Valve: Trileaflet, moderately thickened, moderately calcified aortic valve with severely restricted leaflet opening. There are only mild calcifications extending into the LVOT.  Aorta: Normal caliber. Mild diffuse calcifications in the sinotubular junction, aortic arch and descending aorta. No dissection.  Sinotubular Junction:  25 x 23 mm  Ascending Thoracic Aorta:  27 x 26 mm  Aortic Arch:  21 x 21 mm  Descending Thoracic Aorta:  24 x 24 mm  Sinus of Valsalva Measurements:  Non-coronary:  30 mm  Right -coronary:  26 mm  Left -coronary:  28 mm  Coronary Artery Height above Annulus:  Left Main:  9.5 mm  Right Coronary:  15 mm  Virtual Basal Annulus Measurements:  Maximum/Minimum Diameter:  26 x 18 mm  Perimeter:  86 mm  Area:  356 mm2  Coronary Arteries: Normal origin, the study is insufficient for the coronary evaluation.  Optimum Fluoroscopic Angle for Delivery:  RAO 3 CRA 3  Normal pulmonary vein drainage into the left atrium.  No thrombus in the large left atrial appendage.  IMPRESSION: 1. Trileaflet, moderately thickened, moderately calcified aortic valve with severely restricted leaflet opening and only mild calcifications extending into the LVOT. The annular measurements suitable for delivery of a 23 mm Edwards-SAPIEN TAVR 3 valve.  2.  Sufficient annular to coronary distance.  3. Optimum Fluoroscopic Angle for Delivery:  RAO 3 CRA 3  Patient Profile     80 year old female with a significant PMH of severe AS undergoing evaluation for transcatheter aortic implantation, PAF, OM3 75% at cath 11/07, pulmonary hypertension, HTN, DM, and anemia admitted 11/16 for shortness of breath.  Assessment & Plan    # Troponin elevation: - Likely demand ischemia due to hypertensive urgency, volume overload, aortic stenosis,  with single vessel coronary artery disease.  - Patient is without chest pain.  Patient feels better at this time. - Minimal elevation and flat trend in troponin c/w demand ischemia - Continue aspirin at this time.  # Aortic stenosis: - Patient with known severe aortic stenosis currently undergoing work up for TAVR.  Dyspnea likely related to aortic stenosis.  She would like to get her workup for TAVR done while in hospital. Cardiac CT done and recommended 66m Edwards-Sapien TAVR 3 valve - creatinine bumped so diuretics  on hold and creatinine improved today.  Cxray showed improved edema from prior cxray. Restarting home dose of Lasix today. - Avoid vasodilator blood pressure medications in this patient  # Coronary artery disease: - Patient with known single vessel disease involving her 3rd obtuse marginal.  Undergoing TAVR work up at this time.  Low suspicion that her current symptoms are related to coronary artery disease. Continue ASA  # Hypertension - Blood pressure was substantially elevated with potential hypertensive urgency in the setting of aortic stenosis on admission.  She does have stage II chronic kidney disease.  She says that she had very bad white coat HTN. - Not many options for blood pressure control based on her aortic stenosis.  BP improved after restarting home BP meds.   # Persistent atrial fibrillation s/p DCCV 09/2015 maintaining NSR on Amio.  She is not on anticoagulation due to history of anemia and heme + stools with no clear cut GI source.   # Acute on CKD stage 3 - creatinine bumped with IV contrast dye.  Held diuretics yesterday and improved creatinine.  Will restart PO home dose of Lasix today.    She has followup with extender in the office tomorrow.   Signed, Fransico Him, MD [facility] 09/12/2016

## 2016-09-14 NOTE — Care Management Note (Addendum)
Case Management Note  Patient Details  Name: Patricia [name] MRN: [id number] Date of Birth: Sep 24, 1936  Subjective/Objective:                  SOB, acute on chronic respiratory failure Action/Plan: Discharge planning Expected Discharge Date:  09/14/16               Expected Discharge Plan:  Home/Self Care  In-House Referral:     Discharge planning Services  CM Consult  Post Acute Care Choice:  Durable Medical Equipment Choice offered to:  Patient  DME Arranged:  Oxygen DME Agency:  [place]:  NA [place] Agency:  NA  Status of Service:  Completed, signed off  If discussed at [place] of Stay Meetings, dates discussed:    Additional Comments: CM received call from RN requesting home O2 to be arranged.  Choice of agency to supply O2 made and pt chooses [facility].  CM notified [place] DME rep, Reggie to please arrange home O2 and deliver the tank for transportation home to room prior to discharge.  NO other CM needs were communicated. Dellie Catholic, RN 09/14/2016, 1:26 PM

## 2016-09-15 ENCOUNTER — Ambulatory Visit: Payer: Medicare Other | Admitting: Physician Assistant

## 2016-09-15 ENCOUNTER — Telehealth: Payer: Self-pay

## 2016-09-15 ENCOUNTER — Other Ambulatory Visit: Payer: Self-pay | Admitting: *Deleted

## 2016-09-15 DIAGNOSIS — N289 Disorder of kidney and ureter, unspecified: Secondary | ICD-10-CM

## 2016-09-15 DIAGNOSIS — I350 Nonrheumatic aortic (valve) stenosis: Secondary | ICD-10-CM

## 2016-09-15 LAB — VAS US CAROTID
LEFT ECA DIAS: -21 cm/s
LEFT VERTEBRAL DIAS: -10 cm/s
Left CCA dist dias: 17 cm/s
Left CCA dist sys: 101 cm/s
Left CCA prox dias: 15 cm/s
Left CCA prox sys: 131 cm/s
Left ICA dist dias: -28 cm/s
Left ICA dist sys: -150 cm/s
Left ICA prox dias: -21 cm/s
Left ICA prox sys: -137 cm/s
RIGHT ECA DIAS: -2 cm/s
RIGHT VERTEBRAL DIAS: -10 cm/s
Right CCA prox dias: 9 cm/s
Right CCA prox sys: 57 cm/s

## 2016-09-15 NOTE — Telephone Encounter (Signed)
Transition Care Management Follow-Up Telephone Call   Date discharged and where: Patricia [name]; 09/14/16  How have you been since you were released from the hospital?    Doing well; been sitting in her recliner since she got home. She was placed on 2 L of O2.   Any patient concerns? Pt had an issue w/ getting her O2 at home but it has been resolved.   Items Reviewed:   Meds: Y Allergies: Y  Dietary Changes Reviewed: Y  Functional Questionnaire:  Independent-I Dependent-D  ADLs:   Dressing- I    Eating-I   Maintaining continence-I   Transferring-I   Transportation-I   Meal Prep-I   Managing Meds- I  Confirmed importance and Date/Time of follow-up visits scheduled:  TOC F/U appt. Is 09/16/16 w/ Dr. Nyoka Cowden   Confirmed with patient if condition worsens to call PCP or go to the Emergency Dept. Patient was given office number and encouraged to call back with questions or concerns: Darreld Mclean

## 2016-09-16 ENCOUNTER — Ambulatory Visit (HOSPITAL_COMMUNITY): Payer: Medicare Other

## 2016-09-16 ENCOUNTER — Ambulatory Visit (HOSPITAL_COMMUNITY)
Admission: RE | Admit: 2016-09-16 | Discharge: 2016-09-16 | Disposition: A | Discharge status: Home / Self Care | Payer: Medicare Other | Source: Ambulatory Visit | Attending: Surgery | Admitting: Surgery

## 2016-09-16 ENCOUNTER — Encounter: Payer: Self-pay | Admitting: Internal Medicine

## 2016-09-16 ENCOUNTER — Ambulatory Visit (INDEPENDENT_AMBULATORY_CARE_PROVIDER_SITE_OTHER): Payer: Medicare Other

## 2016-09-16 ENCOUNTER — Ambulatory Visit (INDEPENDENT_AMBULATORY_CARE_PROVIDER_SITE_OTHER): Payer: Medicare Other | Admitting: Internal Medicine

## 2016-09-16 ENCOUNTER — Ambulatory Visit: Payer: Medicare Other

## 2016-09-16 VITALS — BP 142/50 | HR 71 | Temp 98.1°F | Ht 63.0 in | Wt 218.2 lb

## 2016-09-16 VITALS — BP 142/50 | HR 71 | Temp 98.1°F | Ht 63.0 in | Wt 218.0 lb

## 2016-09-16 DIAGNOSIS — E119 Type 2 diabetes mellitus without complications: Secondary | ICD-10-CM | POA: Diagnosis not present

## 2016-09-16 DIAGNOSIS — E039 Hypothyroidism, unspecified: Secondary | ICD-10-CM

## 2016-09-16 DIAGNOSIS — Z0000 Encounter for general adult medical examination without abnormal findings: Secondary | ICD-10-CM

## 2016-09-16 DIAGNOSIS — R942 Abnormal results of pulmonary function studies: Secondary | ICD-10-CM | POA: Insufficient documentation

## 2016-09-16 DIAGNOSIS — J988 Other specified respiratory disorders: Secondary | ICD-10-CM | POA: Diagnosis not present

## 2016-09-16 DIAGNOSIS — Z87891 Personal history of nicotine dependence: Secondary | ICD-10-CM | POA: Diagnosis not present

## 2016-09-16 DIAGNOSIS — E7800 Pure hypercholesterolemia, unspecified: Secondary | ICD-10-CM | POA: Diagnosis not present

## 2016-09-16 DIAGNOSIS — I10 Essential (primary) hypertension: Secondary | ICD-10-CM | POA: Diagnosis not present

## 2016-09-16 DIAGNOSIS — I5032 Chronic diastolic (congestive) heart failure: Secondary | ICD-10-CM | POA: Diagnosis not present

## 2016-09-16 DIAGNOSIS — I350 Nonrheumatic aortic (valve) stenosis: Secondary | ICD-10-CM

## 2016-09-16 DIAGNOSIS — D649 Anemia, unspecified: Secondary | ICD-10-CM | POA: Diagnosis not present

## 2016-09-16 DIAGNOSIS — IMO0001 Reserved for inherently not codable concepts without codable children: Secondary | ICD-10-CM

## 2016-09-16 DIAGNOSIS — Z6838 Body mass index (BMI) 38.0-38.9, adult: Secondary | ICD-10-CM | POA: Diagnosis not present

## 2016-09-16 DIAGNOSIS — E6609 Other obesity due to excess calories: Secondary | ICD-10-CM | POA: Diagnosis not present

## 2016-09-16 LAB — PULMONARY FUNCTION TEST
DL/VA % pred: 74 %
DL/VA: 3.38 ml/min/mmHg/L
DLCO unc % pred: 59 %
DLCO unc: 12.73 ml/min/mmHg
FEF 25-75 Post: 1.39 L/sec
FEF 25-75 Pre: 0.92 L/sec
FEF2575-%Change-Post: 51 %
FEF2575-%Pred-Post: 99 %
FEF2575-%Pred-Pre: 65 %
FEV1-%Change-Post: 12 %
FEV1-%Pred-Post: 83 %
FEV1-%Pred-Pre: 74 %
FEV1-Post: 1.53 L
FEV1-Pre: 1.36 L
FEV1FVC-%Change-Post: 0 %
FEV1FVC-%Pred-Pre: 95 %
FEV6-%Change-Post: 13 %
FEV6-%Pred-Post: 93 %
FEV6-%Pred-Pre: 82 %
FEV6-Post: 2.16 L
FEV6-Pre: 1.9 L
FEV6FVC-%Change-Post: 0 %
FEV6FVC-%Pred-Post: 105 %
FEV6FVC-%Pred-Pre: 105 %
FVC-%Change-Post: 12 %
FVC-%Pred-Post: 88 %
FVC-%Pred-Pre: 78 %
FVC-Post: 2.17 L
FVC-Pre: 1.92 L
Post FEV1/FVC ratio: 70 %
Post FEV6/FVC ratio: 100 %
Pre FEV1/FVC ratio: 71 %
Pre FEV6/FVC Ratio: 99 %
RV % pred: 73 %
RV: 1.65 L
TLC % pred: 75 %
TLC: 3.59 L

## 2016-09-16 MED ORDER — ALBUTEROL SULFATE (2.5 MG/3ML) 0.083% IN NEBU
2.5000 mg | INHALATION_SOLUTION | Freq: Once | RESPIRATORY_TRACT | Status: AC
  Administered 2016-09-16: 2.5 mg via RESPIRATORY_TRACT

## 2016-09-16 NOTE — Progress Notes (Signed)
Subjective:   Patricia [name] is a 80 y.o. female who presents for an Initial Medicare Annual Wellness Visit. Patient recently in the hospital on 11/16-11/19 due to shortness of breath/chronic asthma.   Review of Systems   Cardiac Risk Factors include: advanced age (>46mn, >>67women);hypertension;obesity (BMI >30kg/m2);sedentary lifestyle;diabetes mellitus;family history of premature cardiovascular disease     Objective:    Today's Vitals   09/16/16 1519  BP: (!) 142/50  Pulse: 71  Temp: 98.1 F (36.7 C)  TempSrc: Oral  SpO2: 95%  Weight: 218 lb 3.2 oz (99 kg)  Height: _0  (1.6 m)  PainSc: 0-No pain   Body mass index is 38.65 kg/m.   Current Medications (verified) Outpatient Encounter Prescriptions as of 09/16/2016  Medication Sig  . acetaminophen (TYLENOL) 500 MG tablet Take 500 mg by mouth every 6 (six) hours as needed for mild pain.  .Marland KitchenADVAIR DISKUS 100-50 MCG/DOSE AEPB Inhale 1 inhalation two  times daily for breathing  . albuterol (PROVENTIL HFA;VENTOLIN HFA) 108 (90 Base) MCG/ACT inhaler Inhale 2 puffs into the lungs every 6 (six) hours as needed for wheezing or shortness of breath.  .Marland Kitchenamiodarone (PACERONE) 200 MG tablet Take one tablet daily by mouth  . aspirin EC 81 MG EC tablet Take 1 tablet (81 mg total) by mouth daily.  . cetirizine (ZYRTEC) 10 MG tablet Take 10 mg by mouth daily as needed for allergies.   . cholecalciferol (VITAMIN D) 1000 units tablet One daily for vitamin D supplement  . COMFORT LANCETS MISC Use daily to get blood for glucometer  . diltiazem (CARDIZEM CD) 180 MG 24 hr capsule Take 1 capsule by mouth  once a day to control blood pressure  . doxazosin (CARDURA) 4 MG tablet TAKE 1 TABLET BY MOUTH ONCE DAILY TO HELP CONTROL BLOOD PRESSURE  . ferrous sulfate 325 (65 FE) MG EC tablet Take 1 tablet (325 mg total) by mouth daily with breakfast.  . furosemide (LASIX) 40 MG tablet Take 1 tablet (40 mg total) by mouth daily.  .Marland Kitchenglimepiride (AMARYL) 2  MG tablet Take 1 tablet by mouth at  breakfast to control  Glucose  . glucose blood (BAYER CONTOUR TEST) test strip Use daily too check glucose  . levothyroxine (SYNTHROID, LEVOTHROID) 50 MCG tablet Take 1 tablet by mouth  daily for thyroid  supplement  . metFORMIN (GLUCOPHAGE) 500 MG tablet Take 500 mg by mouth 2 (two) times daily with a meal.  . pantoprazole (PROTONIX) 40 MG tablet Take 1 tablet (40 mg total) by mouth daily.  . potassium gluconate 595 (99 K) MG TABS tablet Take 595 mg by mouth every other day. TAKES ONLY WHEN USING FUROSEMIDE  . pravastatin (PRAVACHOL) 20 MG tablet TAKE 1 TABLET BY MOUTH  DAILY  . triamcinolone (KENALOG) 0.025 % cream Apply 1 application topically daily as needed (psoriasis).   . [DISCONTINUED] albuterol (PROVENTIL HFA;VENTOLIN HFA) 108 (90 BASE) MCG/ACT inhaler Inhale 2 puffs into the lungs every 6 (six) hours as needed for wheezing or shortness of breath.  . [DISCONTINUED] albuterol (PROVENTIL) (2.5 MG/3ML) 0.083% nebulizer solution Take 2.5 mg by nebulization every 6 (six) hours as needed for wheezing or shortness of breath.   No facility-administered encounter medications on file as of 09/16/2016.     Allergies (verified) Codeine and Vesicare [solifenacin]   History: Past Medical History:  Diagnosis Date  . Allergy   . Anemia, unspecified   . Arthritis   . Asthma   . Atrial fibrillation  status post cardioversion [facility]) 09/2015   s/p TEE/DCCV>>SR on amio  . Benign neoplasm of colon   . Carpal tunnel syndrome   . Cellulitis and abscess of finger, unspecified   . Cerumen impaction   . Cervicalgia   . Chronic airway obstruction, not elsewhere classified   . Chronic anticoagulation 09/2015   Xarelto for afib, CHADS2VASC=5  . Diarrhea   . Diffuse cystic mastopathy   . Dysrhythmia   . Edema   . Encounter for long-term (current) use of other medications   . GERD (gastroesophageal reflux disease)   . Heart murmur   . Lumbago   . Neck pain  05/23/2015  . Obesity, unspecified   . Other and unspecified hyperlipidemia   . Other psoriasis   . Pain in joint, lower leg   . PONV (postoperative nausea and vomiting)   . Primary localized osteoarthrosis of right shoulder 12/18/2015  . Routine gynecological examination   . Scoliosis (and kyphoscoliosis), idiopathic   . Severe aortic stenosis   . Shortness of breath dyspnea    with exertion  . Type II or unspecified type diabetes mellitus without mention of complication, uncontrolled   . Unspecified essential hypertension   . Unspecified hemorrhoids without mention of complication   . Unspecified hypothyroidism   . Urge incontinence    Past Surgical History:  Procedure Laterality Date  . ABDOMINAL HYSTERECTOMY  1987   complete  . BREAST SURGERY     right breast 3 surgeries [phone]  . CARDIAC CATHETERIZATION N/A 09/02/2016   Procedure: Right/Left Heart Cath and Coronary Angiography;  Surgeon: [name] M Martinique, MD;  Location: [place] [facility];  Service: Cardiovascular;  Laterality: N/A;  . CARDIOVERSION N/A 10/19/2015   Procedure: CARDIOVERSION;  Surgeon: Lelon Perla, MD;  Location: [place];  Service: Cardiovascular;  Laterality: N/A;  . [place]  . COLON SURGERY     2004,2007,2013.  3 times colon surgieres  . EXCISE LE MANDIBULAR LYMPH NODE T     DR C. NEWMAN  . FOOT SURGERY  1989  . LEFT SHOULDER ARTHROSCOPY    . 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 NEUROPLASTY / TRANSPOSITION MEDIAN NERVE AT CARPAL TUNNEL BILATERAL  05/2003  . TEE WITHOUT CARDIOVERSION N/A 10/19/2015   Procedure: Transesophageal Echocardiogram (TEE) ;  Surgeon: Lelon Perla, MD;  Location: [facility];  Service: Cardiovascular;  Laterality: N/A;  . TOTAL SHOULDER ARTHROPLASTY Right 12/18/2015   Procedure: TOTAL SHOULDER ARTHROPLASTY;  Surgeon: [name], MD;  Location: [place];  Service: Orthopedics;  Laterality: Right;  . TRIGGER FINGER RELEASE Left   . VAGINAL  CYST REMOVED  1967   Family History  Problem Relation Age of Onset  . Diabetes Father   . Stroke Father   . Heart disease Father   . Liver cancer Sister   . Heart disease Brother   . Asthma Sister   . Arthritis Sister    Social History   Occupational History  . Retired Market researcher for [employer] History Main Topics  . Smoking status: Former Smoker    Years: 40.00    Quit date: 09/07/1989  . Smokeless tobacco: Never Used  . Alcohol use No  . Drug use: No  . Sexual activity: No    Tobacco Counseling Counseling given: No   Activities of Daily Living In your present state of health, do you have any difficulty performing the following activities: 09/16/2016 09/12/2016  Hearing? N -  Vision? N -  Difficulty concentrating or making decisions? N -  Walking or climbing stairs? Y -  Dressing or bathing? N -  Doing errands, shopping? N N  Preparing Food and eating ? N -  Using the Toilet? N -  In the past six months, have you accidently leaked urine? Y -  Do you have problems with loss of bowel control? N -  Managing your Medications? N -  Managing your Finances? N -  Housekeeping or managing your Housekeeping? N -  Some recent data might be hidden    Immunizations and Health Maintenance Immunization History  Administered Date(s) Administered  . DTaP 08/03/2003  . Influenza,inj,Quad PF,36+ Mos 09/29/2013, 07/19/2014, 09/26/2015, 09/13/2016  . Influenza-Unspecified 11/04/2012  . Pneumococcal Conjugate-13 09/26/2015  . Pneumococcal Polysaccharide-23 12/01/1993   Health Maintenance Due  Topic Date Due  . FOOT EXAM  01/24/2016    Patient Care Team: Estill Dooms, MD as PCP - General (Internal Medicine) [name], MD as Consulting Physician (Orthopedic Surgery) Druscilla Brownie, MD as Consulting Physician (Dermatology) Ralene Bathe, MD as Consulting Physician (Ophthalmology) Ralene Bathe, MD as Consulting Physician  (Ophthalmology)  Indicate any recent Medical Services you may have received from other than [facility] providers in the past year (date may be approximate).     Assessment:   This is a routine wellness examination for [facility].  Hearing/Vision screen Hearing Screening Comments: Pt has never had a hearing screen. Pt has not had any complaints. Vision Screening Comments: Pt last eye exam was in 02/2016 w/ Dr. Claudean Kinds, results in chart.   Dietary issues and exercise activities discussed: Current Exercise Habits: The patient does not participate in regular exercise at present, Exercise limited by: respiratory conditions(s)  Goals    . <enter goal here>          Starting 09/16/16, I will attempt to eat a heart healthy diet, for the sake of my heart conditions.       Depression Screen PHQ 2/9 Scores 09/16/2016 01/23/2016 10/18/2015 07/18/2015 01/24/2015 10/18/2014 07/19/2014  PHQ - 2 Score 0 0 0 0 0 0 0    Fall Risk Fall Risk  09/16/2016 07/21/2016 01/23/2016 11/27/2015 09/26/2015  Falls in the past year? _0     Cognitive Function: MMSE - Mini Mental State Exam 09/16/2016  Orientation to time 5  Orientation to Place 5  Registration 3  Attention/ Calculation 3  Recall 3  Language- name 2 objects 2  Language- repeat 1  Language- follow 3 step command 3  Language- read & follow direction 1  Write a sentence 1  Copy design 1  Total score 28        Screening Tests Health Maintenance  Topic Date Due  . FOOT EXAM  01/24/2016  . ZOSTAVAX  10/27/2019 (Originally 08/12/1998)  . TETANUS/TDAP  10/27/2019 (Originally 08/12/1955)  . PNA vac Low Risk Adult (2 of 2 - PPSV23) 09/25/2016  . HEMOGLOBIN A1C  10/29/2016  . OPHTHALMOLOGY EXAM  03/12/2017  . URINE MICROALBUMIN  04/28/2017  . COLONOSCOPY  03/19/2021  . INFLUENZA VACCINE  Completed  . DEXA SCAN  Completed      Plan:    I have personally reviewed and addressed the Medicare Annual Wellness questionnaire and have noted the  following in the patient's chart:  A. Medical and social history B. Use of alcohol, tobacco or illicit drugs  C. Current medications and supplements D. Functional ability and status E.  Nutritional status F.  Physical  activity G. Advance directives H. List of other physicians I.  Hospitalizations, surgeries, and ER visits in previous 12 months J.  [place] to include hearing, vision, cognitive, depression L. Referrals and appointments - none  In addition, I have reviewed and discussed with patient certain preventive protocols, quality metrics, and best practice recommendations. A written personalized care plan for preventive services as well as general preventive health recommendations were provided to patient.  See attached scanned questionnaire for additional information.   Signed,   Allyn Kenner, LPN Health Advisor    I have reviewed the information entered by the Health Advisor. I was present in the office during the time of patient interaction and was available for consultation. I agree with the documentation and advice.  Viviann Spare Nyoka Cowden, MD

## 2016-09-16 NOTE — Patient Instructions (Addendum)
Patricia [name] , Thank you for taking time to come for your Medicare Wellness Visit. I appreciate your ongoing commitment to your health goals. Please review the following plan we discussed and let me know if I can assist you in the future.   These are the goals we discussed: Goals    . <enter goal here>          Starting 09/16/16, I will attempt to eat a heart healthy diet, for the sake of my heart conditions.        This is a list of the screening recommended for you and due dates:  Health Maintenance  Topic Date Due  . Complete foot exam   01/24/2016  . Shingles Vaccine  10/27/2019*  . Tetanus Vaccine  10/27/2019*  . Pneumonia vaccines (2 of 2 - PPSV23) 09/25/2016  . Hemoglobin A1C  10/29/2016  . Eye exam for diabetics  03/12/2017  . Urine Protein Check  04/28/2017  . Colon Cancer Screening  03/19/2021  . Flu Shot  Completed  . DEXA scan (bone density measurement)  Completed  *Topic was postponed. The date shown is not the original due date.  Preventive Care for Adults  A healthy lifestyle and preventive care can promote health and wellness. Preventive health guidelines for adults include the following key practices.  . A routine yearly physical is a good way to check with your health care provider about your health and preventive screening. It is a chance to share any concerns and updates on your health and to receive a thorough exam.  . Visit your dentist for a routine exam and preventive care every 6 months. Brush your teeth twice a day and floss once a day. Good oral hygiene prevents tooth decay and gum disease.  . The frequency of eye exams is based on your age, health, family medical history, use  of contact lenses, and other factors. Follow your health care provider's ecommendations for frequency of eye exams.  . Eat a healthy diet. Foods like vegetables, fruits, whole grains, low-fat dairy products, and lean protein foods contain the nutrients you need without too many  calories. Decrease your intake of foods high in solid fats, added sugars, and salt. Eat the right amount of calories for you. Get information about a proper diet from your health care provider, if necessary.  . Regular physical exercise is one of the most important things you can do for your health. Most adults should get at least 150 minutes of moderate-intensity exercise (any activity that increases your heart rate and causes you to sweat) each week. In addition, most adults need muscle-strengthening exercises on 2 or more days a week.  Silver Sneakers may be a benefit available to you. To determine eligibility, you may visit the website: www.silversneakers.com or contact program at [phone] Mon-Fri between 8AM-8PM.   . Maintain a healthy weight. The body mass index (BMI) is a screening tool to identify possible weight problems. It provides an estimate of body fat based on height and weight. Your health care provider can find your BMI and can help you achieve or maintain a healthy weight.   For adults 20 years and older: ? A BMI below 18.5 is considered underweight. ? A BMI of 18.5 to 24.9 is normal. ? A BMI of 25 to 29.9 is considered overweight. ? A BMI of 30 and above is considered obese.   . Maintain normal blood lipids and cholesterol levels by exercising and minimizing your intake of saturated fat.  Eat a balanced diet with plenty of fruit and vegetables. Blood tests for lipids and cholesterol should begin at age 90 and be repeated every 5 years. If your lipid or cholesterol levels are high, you are over 50, or you are at high risk for heart disease, you may need your cholesterol levels checked more frequently. Ongoing high lipid and cholesterol levels should be treated with medicines if diet and exercise are not working.  . If you smoke, find out from your health care provider how to quit. If you do not use tobacco, please do not start.  . If you choose to drink alcohol, please do  not consume more than 2 drinks per day. One drink is considered to be 12 ounces (355 mL) of beer, 5 ounces (148 mL) of wine, or 1.5 ounces (44 mL) of liquor.  . If you are 25-70 years old, ask your health care provider if you should take aspirin to prevent strokes.  . Use sunscreen. Apply sunscreen liberally and repeatedly throughout the day. You should seek shade when your shadow is shorter than you. Protect yourself by wearing long sleeves, pants, a wide-brimmed hat, and sunglasses year round, whenever you are outdoors.  . Once a month, do a whole body skin exam, using a mirror to look at the skin on your back. Tell your health care provider of new moles, moles that have irregular borders, moles that are larger than a pencil eraser, or moles that have changed in shape or color.

## 2016-09-16 NOTE — Progress Notes (Signed)
Quick Notes  Pt being seen in office today for TOC F/U from being in the hospital for SOB/Chronic Asthma. Pt is now on O2 (2Liters).  Health Maintenance:   Patient is due for foot exam.   Abnormal Screen:  None, MMSE-30/30, Passed Clock Test   Patient Concerns:   None   Nurse Concerns:   None  I have reviewed the information entered by the Health Advisor. I was present in the office during the time of patient interaction and was available for consultation. I agree with the documentation and advice.  Viviann Spare Nyoka Cowden, MD

## 2016-09-16 NOTE — Progress Notes (Signed)
Facility  [place]    Place of Service:   OFFICE    Allergies  Allergen Reactions  . Codeine Other (See Comments)    "spaces her out, dizziness, can't walk well"  . Vesicare [Solifenacin] Itching    Chief Complaint  Patient presents with  . Hospitalization Follow-up    [facility] hospital follow-up 09/10/16 to 09/14/16 acute respiratory failure with hypoxia    HPI:   Hospitalized 09/10/16 to 09/14/16 with acute respiratory failure and pulmonary edema from CHF. Found to have severe aortic stenosis. She is now undergoing screening tests before scheduling for TAVR. She is now on home O2. She is comfortable with her breathing after being diuresed in hospital.  Essential hypertension - controlled  Pure hypercholesterolemia - follow up lab next visit  Hypothyroidism, unspecified type - compensated  Chronic diastolic CHF (congestive heart failure) ([place]) - improved during hospitallization  Aortic valve stenosis, etiology of cardiac valve disease unspecified - undergoing screnninto be sure she is a safe candidate for planned TAVR  Anemia, unspecified type - last hgb was 9.5 gm%. She remains on iron.  Type 2 diabetes mellitus without complication, without long-term current use of insulin (HCC) - controlled  Class 2 obesity due to excess calories with serious comorbidity and body mass index (BMI) of 38.0 to 38.9 in adult - morbid obesity. She has not lost any weight in the last 3 years.    Medications: Patient's Medications  New Prescriptions   No medications on file  Previous Medications   ACETAMINOPHEN (TYLENOL) 500 MG TABLET    Take 500 mg by mouth every 6 (six) hours as needed for mild pain.   ADVAIR DISKUS 100-50 MCG/DOSE AEPB    Inhale 1 inhalation two  times daily for breathing   ALBUTEROL (PROVENTIL HFA;VENTOLIN HFA) 108 (90 BASE) MCG/ACT INHALER    Inhale 2 puffs into the lungs every 6 (six) hours as needed for wheezing or shortness of breath.   AMIODARONE (PACERONE) 200 MG  TABLET    Take one tablet daily by mouth   ASPIRIN EC 81 MG EC TABLET    Take 1 tablet (81 mg total) by mouth daily.   CETIRIZINE (ZYRTEC) 10 MG TABLET    Take 10 mg by mouth daily as needed for allergies.    CHOLECALCIFEROL (VITAMIN D) 1000 UNITS TABLET    One daily for vitamin D supplement   COMFORT LANCETS MISC    Use daily to get blood for glucometer   DILTIAZEM (CARDIZEM CD) 180 MG 24 HR CAPSULE    Take 1 capsule by mouth  once a day to control blood pressure   DOXAZOSIN (CARDURA) 4 MG TABLET    TAKE 1 TABLET BY MOUTH ONCE DAILY TO HELP CONTROL BLOOD PRESSURE   FERROUS SULFATE 325 (65 FE) MG EC TABLET    Take 1 tablet (325 mg total) by mouth daily with breakfast.   FUROSEMIDE (LASIX) 40 MG TABLET    Take 1 tablet (40 mg total) by mouth daily.   GLIMEPIRIDE (AMARYL) 2 MG TABLET    Take 1 tablet by mouth at  breakfast to control  Glucose   GLUCOSE BLOOD (BAYER CONTOUR TEST) TEST STRIP    Use daily too check glucose   LEVOTHYROXINE (SYNTHROID, LEVOTHROID) 50 MCG TABLET    Take 1 tablet by mouth  daily for thyroid  supplement   METFORMIN (GLUCOPHAGE) 500 MG TABLET    Take 500 mg by mouth 2 (two) times daily with a meal.  PANTOPRAZOLE (PROTONIX) 40 MG TABLET    Take 1 tablet (40 mg total) by mouth daily.   POTASSIUM GLUCONATE 595 (99 K) MG TABS TABLET    Take 595 mg by mouth every other day. TAKES ONLY WHEN USING FUROSEMIDE   PRAVASTATIN (PRAVACHOL) 20 MG TABLET    TAKE 1 TABLET BY MOUTH  DAILY   TRIAMCINOLONE (KENALOG) 0.025 % CREAM    Apply 1 application topically daily as needed (psoriasis).   Modified Medications   No medications on file  Discontinued Medications   No medications on file    Review of Systems  Constitutional: Negative for activity change, appetite change, chills, diaphoresis, fatigue, fever and unexpected weight change.  HENT: Positive for postnasal drip. Negative for congestion, ear discharge, ear pain, hearing loss, rhinorrhea, sore throat, tinnitus, trouble swallowing  and voice change.   Eyes: Negative for pain, redness, itching and visual disturbance.  Respiratory: Negative for cough, choking, shortness of breath and wheezing.   Cardiovascular: Negative for chest pain, palpitations and leg swelling.       Sever Aortic valve stenosis  Gastrointestinal: Negative for abdominal distention, abdominal pain, constipation, diarrhea and nausea.  Endocrine: Negative for cold intolerance, heat intolerance, polydipsia, polyphagia and polyuria.       Diabetic. Hypothyroid.  Genitourinary: Negative for difficulty urinating, dysuria, flank pain, frequency, hematuria, pelvic pain, urgency and vaginal discharge.  Musculoskeletal: Positive for arthralgias and back pain. Negative for gait problem, myalgias, neck pain and neck stiffness.       Post right shoulder arthroplasty in Feb. 2017. Has seen Dr. Eddie Dibbles about pain in the right knee. Got injection but it did not help. He talked about using a gel injection. Knee gives out sometimes. Using a cane at times. Recurrent problems with mild discomfort.  Skin: Negative for color change, pallor and rash.  Allergic/Immunologic: Negative.   Neurological: Negative for dizziness, tremors, seizures, syncope, weakness, numbness and headaches.  Hematological: Negative for adenopathy. Does not bruise/bleed easily.       Anemic on lab in October 2016. Had EGD and colonoscopy without discovery of etiology.  Psychiatric/Behavioral: Negative.  Negative for agitation, behavioral problems, confusion, dysphoric mood, hallucinations, sleep disturbance and suicidal ideas. The patient is not nervous/anxious and is not hyperactive.     Vitals:   09/16/16 1610  BP: (!) 142/50  Pulse: 71  Temp: 98.1 F (36.7 C)  TempSrc: Oral  SpO2: 95%  Weight: 218 lb (98.9 kg)  Height: _0  (1.6 m)   Body mass index is 38.62 kg/m. Wt Readings from Last 3 Encounters:  09/16/16 218 lb (98.9 kg)  09/16/16 218 lb 3.2 oz (99 kg)  09/14/16 221 lb (100.2 kg)        Physical Exam  Constitutional: She is oriented to person, place, and time. No distress.  Morbid obesity  HENT:  Head: Normocephalic and atraumatic.  Right Ear: External ear normal.  Left Ear: External ear normal.  Nose: Nose normal.  Mouth/Throat: Oropharynx is clear and moist.  Right EAC partially occluded with cerumen. Following removal right TM retraction noted.  Eyes: Conjunctivae and EOM are normal.  Neck: Normal range of motion. Neck supple. No JVD present. No tracheal deviation present. No thyromegaly present.  Cardiovascular: Normal rate, regular rhythm and intact distal pulses.  Exam reveals no gallop and no friction rub.   Murmur (2/6 SEM at the aortic area and LSB) heard. 3/6 aortic ejection murmur  Pulmonary/Chest: No respiratory distress. She has no wheezes. She exhibits no tenderness.  Abdominal: Bowel sounds are normal. She exhibits no distension and no mass. There is no tenderness.  Musculoskeletal: Normal range of motion. She exhibits edema. She exhibits no tenderness.  Pain in the both shoulders (R>L) with rotational movement. Pain in both knees (R>L).  Lymphadenopathy:    She has no cervical adenopathy.  Neurological: She is alert and oriented to person, place, and time. She has normal reflexes. No cranial nerve deficit. Coordination normal.  Skin: No rash noted. No erythema. No pallor.  Psychiatric: She has a normal mood and affect. Judgment and thought content normal.    Labs reviewed: Lab Summary Latest Ref Rng & Units 09/14/2016 09/13/2016 09/12/2016 09/11/2016 08/27/2016  Hemoglobin 12.0 - 15.0 g/dL (None) (None) 9.5(L) 10.0(L) 10.4(L)  Hematocrit 36.0 - 46.0 % (None) (None) 30.3(L) 31.7(L) 33.3(L)  White count 4.0 - 10.5 K/uL (None) (None) 7.7 8.6 6.4  Platelet count 150 - 400 K/uL (None) (None) 231 234 253  Sodium 135 - 145 mmol/L 139 138 138 141 138  Potassium 3.5 - 5.1 mmol/L 4.5 4.5 4.5 4.8 4.3  Calcium 8.9 - 10.3 mg/dL 9.2 8.9 9.2 9.5 9.3   Phosphorus - (None) (None) (None) (None) (None)  Creatinine 0.44 - 1.00 mg/dL 1.39(H) 1.44(H) 1.31(H) 1.13(H) 1.18(H)  AST - (None) (None) (None) (None) (None)  Alk Phos - (None) (None) (None) (None) (None)  Bilirubin - (None) (None) (None) (None) (None)  Glucose 65 - 99 mg/dL 129(H) 127(H) 224(H) 181(H) 146(H)  Cholesterol - (None) (None) (None) (None) (None)  HDL cholesterol - (None) (None) (None) (None) (None)  Triglycerides - (None) (None) (None) (None) (None)  LDL Direct - (None) (None) (None) (None) (None)  LDL Calc - (None) (None) (None) (None) (None)  Total protein - (None) (None) (None) (None) (None)  Albumin - (None) (None) (None) (None) (None)  Some recent data might be hidden   Lab Results  Component Value Date   TSH 1.297 09/12/2016   TSH 6.030 (H) 04/28/2016   TSH 4.09 04/07/2016   Lab Results  Component Value Date   BUN 31 (H) 09/14/2016   BUN 36 (H) 09/13/2016   BUN 20 09/12/2016   Lab Results  Component Value Date   HGBA1C 6.8 (H) 04/28/2016   HGBA1C 7.2 (H) 12/07/2015   HGBA1C 7.0 (H) 09/24/2015    Assessment/Plan  1. Essential hypertension - Comprehensive metabolic panel; Future  2. Pure hypercholesterolemia -lipid, future  3. Hypothyroidism, unspecified type - TSH; Future  4. Chronic diastolic CHF (congestive heart failure) (HCC) improved  5. Aortic valve stenosis, etiology of cardiac valve disease unspecified -appt with Dr. Ricard Dillon for pre surgical evaluation. Has already seen Dr. Cyndia Bent  6. Anemia, unspecified type - CBC with Differential/Platelet  7. Type 2 diabetes mellitus without complication, without long-term current use of insulin (HCC) - Comprehensive metabolic panel; Future - Hemoglobin A1c; Future  8. Class 2 obesity due to excess calories with serious comorbidity and body mass index (BMI) of 38.0 to 38.9 in adult -1000 calorie diet

## 2016-09-22 ENCOUNTER — Ambulatory Visit (HOSPITAL_COMMUNITY): Payer: Self-pay | Admitting: Dentistry

## 2016-09-22 ENCOUNTER — Encounter (HOSPITAL_COMMUNITY): Payer: Self-pay | Admitting: Dentistry

## 2016-09-22 VITALS — BP 155/45 | HR 63 | Temp 97.7°F

## 2016-09-22 DIAGNOSIS — K031 Abrasion of teeth: Secondary | ICD-10-CM

## 2016-09-22 DIAGNOSIS — K029 Dental caries, unspecified: Secondary | ICD-10-CM

## 2016-09-22 DIAGNOSIS — K0530 Chronic periodontitis, unspecified: Secondary | ICD-10-CM

## 2016-09-22 DIAGNOSIS — I350 Nonrheumatic aortic (valve) stenosis: Secondary | ICD-10-CM

## 2016-09-22 DIAGNOSIS — M264 Malocclusion, unspecified: Secondary | ICD-10-CM

## 2016-09-22 DIAGNOSIS — Z972 Presence of dental prosthetic device (complete) (partial): Secondary | ICD-10-CM

## 2016-09-22 DIAGNOSIS — K08409 Partial loss of teeth, unspecified cause, unspecified class: Secondary | ICD-10-CM

## 2016-09-22 DIAGNOSIS — Z01818 Encounter for other preprocedural examination: Secondary | ICD-10-CM

## 2016-09-22 DIAGNOSIS — K030 Excessive attrition of teeth: Secondary | ICD-10-CM

## 2016-09-22 DIAGNOSIS — J392 Other diseases of pharynx: Secondary | ICD-10-CM

## 2016-09-22 DIAGNOSIS — K0889 Other specified disorders of teeth and supporting structures: Secondary | ICD-10-CM

## 2016-09-22 DIAGNOSIS — K06020 Generalized gingival recession, unspecified: Secondary | ICD-10-CM

## 2016-09-22 DIAGNOSIS — K036 Deposits [accretions] on teeth: Secondary | ICD-10-CM

## 2016-09-22 MED ORDER — CHLORHEXIDINE GLUCONATE 0.12 % MT SOLN: OROMUCOSAL | 99 refills | Status: DC

## 2016-09-22 NOTE — Progress Notes (Signed)
DENTAL CONSULTATION  Date of Consultation:  09/22/2016 Patient Name:   Patricia [name] Date of Birth:   1936/02/21 Medical Record Number: [id number]  VITALS: BP (!) 155/45 (BP Location: Right Arm)   Pulse 63   Temp 97.7 F (36.5 C) (Oral)   CHIEF COMPLAINT: Patient was referred by Dr. Cyndia Bent for a dental consultation.  HPI: Patricia [name] is a 80 year old female recently diagnosed with severe aortic stenosis. Patient with anticipated aortic valve replacement with Dr. Cyndia Bent. Patient is now seen as part of a pre-heart valve surgery dental protocol examination to rule out dental infection that may affect the patient's systemic health and anticipated heart valve surgery.  The patient currently denies acute toothaches, swellings, or abscesses. Patient was last seen by a Dentist "a long time ago".  The last treatment she can remember was for partial denture fabrication in 2002 with Dr. Levora Dredge. The patient had an upper and lower partial denture fabricated at that time. Patient currently indicates that the partial dentures are " loose."  Patient denies having dental phobia. Patient, however, does not like dentists. Patient indicates that she does have a severe gag reflex.  PROBLEM LIST: Patient Active Problem List   Diagnosis Date Noted  . Aortic valve stenosis 09/11/2016    Priority: High  . Acute respiratory failure with hypoxia ([place])   . SOB (shortness of breath)   . Acute respiratory failure ([place]) 09/11/2016  . Acute blood loss anemia 02/06/2016  . Chronic diastolic CHF (congestive heart failure) ([place]) 02/06/2016  . Atrial fibrillation ([place]) 02/05/2016  . Primary localized osteoarthrosis of right shoulder 12/18/2015  . Osteoarthritis of right shoulder 12/18/2015  . Post-nasal drainage 11/27/2015  . Acute congestive heart failure ([place])   . Anemia 09/26/2015  . Neck pain 05/23/2015  . Knee pain, bilateral 07/20/2014  . Lumbago 11/02/2013  . Trigger finger, acquired 11/02/2013  .  Hyperlipidemia 11/02/2013  . Urinary incontinence 11/02/2013  . Rash and nonspecific skin eruption 11/02/2013  . COPD (chronic obstructive pulmonary disease) ([place])   . Hypothyroidism   . Obesity   . Essential hypertension 03/02/2013  . DM type 2 (diabetes mellitus, type 2) ([place]) 03/02/2013    PMH: Past Medical History:  Diagnosis Date  . Allergy   . Anemia, unspecified   . Arthritis   . Asthma   . Atrial fibrillation status post cardioversion [facility]) 09/2015   s/p TEE/DCCV>>SR on amio  . Benign neoplasm of colon   . Carpal tunnel syndrome   . Cellulitis and abscess of finger, unspecified   . Cerumen impaction   . Cervicalgia   . Chronic airway obstruction, not elsewhere classified   . Chronic anticoagulation 09/2015   Xarelto for afib, CHADS2VASC=5  . Diarrhea   . Diffuse cystic mastopathy   . Dysrhythmia   . Edema   . Encounter for long-term (current) use of other medications   . GERD (gastroesophageal reflux disease)   . Heart murmur   . Lumbago   . Neck pain 05/23/2015  . Obesity, unspecified   . Other and unspecified hyperlipidemia   . Other psoriasis   . Pain in joint, lower leg   . PONV (postoperative nausea and vomiting)   . Primary localized osteoarthrosis of right shoulder 12/18/2015  . Routine gynecological examination   . Scoliosis (and kyphoscoliosis), idiopathic   . Severe aortic stenosis   . Shortness of breath dyspnea    with exertion  . Type II or unspecified type diabetes mellitus without mention of  complication, uncontrolled   . Unspecified essential hypertension   . Unspecified hemorrhoids without mention of complication   . Unspecified hypothyroidism   . Urge incontinence     PSH: Past Surgical History:  Procedure Laterality Date  . ABDOMINAL HYSTERECTOMY  1987   complete  . BREAST SURGERY     right breast 3 surgeries [phone]  . CARDIAC CATHETERIZATION N/A 09/02/2016   Procedure: Right/Left Heart Cath and Coronary Angiography;   Surgeon: [name] M Martinique, MD;  Location: [facility];  Service: Cardiovascular;  Laterality: N/A;  . CARDIOVERSION N/A 10/19/2015   Procedure: CARDIOVERSION;  Surgeon: Lelon Perla, MD;  Location: [place];  Service: Cardiovascular;  Laterality: N/A;  . [place]  . COLON SURGERY     2004,2007,2013.  3 times colon surgieres  . EXCISE LE MANDIBULAR LYMPH NODE T     DR C. NEWMAN  . FOOT SURGERY  1989  . LEFT SHOULDER ARTHROSCOPY    . 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 NEUROPLASTY / TRANSPOSITION MEDIAN NERVE AT CARPAL TUNNEL BILATERAL  05/2003  . TEE WITHOUT CARDIOVERSION N/A 10/19/2015   Procedure: Transesophageal Echocardiogram (TEE) ;  Surgeon: Lelon Perla, MD;  Location: [facility];  Service: Cardiovascular;  Laterality: N/A;  . TOTAL SHOULDER ARTHROPLASTY Right 12/18/2015   Procedure: TOTAL SHOULDER ARTHROPLASTY;  Surgeon: [name], MD;  Location: [facility];  Service: Orthopedics;  Laterality: Right;  . TRIGGER FINGER RELEASE Left   . VAGINAL CYST REMOVED  1967    ALLERGIES: Allergies  Allergen Reactions  . Codeine Other (See Comments)    "spaces her out, dizziness, can't walk well"  . Vesicare [Solifenacin] Itching    MEDICATIONS: Current Outpatient Prescriptions  Medication Sig Dispense Refill  . acetaminophen (TYLENOL) 500 MG tablet Take 500 mg by mouth every 6 (six) hours as needed for mild pain.    Marland Kitchen ADVAIR DISKUS 100-50 MCG/DOSE AEPB Inhale 1 inhalation two  times daily for breathing 180 each 3  . albuterol (PROVENTIL HFA;VENTOLIN HFA) 108 (90 Base) MCG/ACT inhaler Inhale 2 puffs into the lungs every 6 (six) hours as needed for wheezing or shortness of breath.    Marland Kitchen amiodarone (PACERONE) 200 MG tablet Take one tablet daily by mouth 90 tablet 3  . aspirin EC 81 MG EC tablet Take 1 tablet (81 mg total) by mouth daily. 30 tablet 0  . cetirizine (ZYRTEC) 10 MG tablet Take 10 mg by mouth daily as needed for allergies.     .  cholecalciferol (VITAMIN D) 1000 units tablet One daily for vitamin D supplement 100 tablet 4  . COMFORT LANCETS MISC Use daily to get blood for glucometer 100 each 3  . diltiazem (CARDIZEM CD) 180 MG 24 hr capsule Take 1 capsule by mouth  once a day to control blood pressure 90 capsule 3  . doxazosin (CARDURA) 4 MG tablet TAKE 1 TABLET BY MOUTH ONCE DAILY TO HELP CONTROL BLOOD PRESSURE 90 tablet 0  . ferrous sulfate 325 (65 FE) MG EC tablet Take 1 tablet (325 mg total) by mouth daily with breakfast. 30 tablet 6  . furosemide (LASIX) 40 MG tablet Take 1 tablet (40 mg total) by mouth daily. 30 tablet 0  . glimepiride (AMARYL) 2 MG tablet Take 1 tablet by mouth at  breakfast to control  Glucose 90 tablet 0  . glucose blood (BAYER CONTOUR TEST) test strip Use daily too check glucose 100 each 4  . levothyroxine (SYNTHROID, LEVOTHROID)  50 MCG tablet Take 1 tablet by mouth  daily for thyroid  supplement 90 tablet 1  . metFORMIN (GLUCOPHAGE) 500 MG tablet Take 500 mg by mouth 2 (two) times daily with a meal.    . pantoprazole (PROTONIX) 40 MG tablet Take 1 tablet (40 mg total) by mouth daily. 90 tablet 1  . potassium gluconate 595 (99 K) MG TABS tablet Take 595 mg by mouth every other day. TAKES ONLY WHEN USING FUROSEMIDE    . pravastatin (PRAVACHOL) 20 MG tablet TAKE 1 TABLET BY MOUTH  DAILY 90 tablet 1  . triamcinolone (KENALOG) 0.025 % cream Apply 1 application topically daily as needed (psoriasis).     . chlorhexidine (PERIDEX) 0.12 % solution Rinse with 15 mls twice daily for 30 seconds. Use after breakfast and at bedtime. Spit out excess. Do not swallow. 480 mL prn   No current facility-administered medications for this visit.     LABS: Lab Results  Component Value Date   WBC 7.7 09/12/2016   HGB 9.5 (L) 09/12/2016   HCT 30.3 (L) 09/12/2016   MCV 85.8 09/12/2016   PLT 231 09/12/2016      Component Value Date/Time   NA 139 09/14/2016 0338   NA 139 04/28/2016 0940   K 4.5 09/14/2016 0338    CL 101 09/14/2016 0338   CO2 27 09/14/2016 0338   GLUCOSE 129 (H) 09/14/2016 0338   BUN 31 (H) 09/14/2016 0338   BUN 27 04/28/2016 0940   CREATININE 1.39 (H) 09/14/2016 0338   CREATININE 1.18 (H) 08/27/2016 1139   CALCIUM 9.2 09/14/2016 0338   GFRNONAA 35 (L) 09/14/2016 0338   GFRNONAA 71 07/18/2015 1303   GFRAA 41 (L) 09/14/2016 0338   GFRAA 82 07/18/2015 1303   Lab Results  Component Value Date   INR 1.0 08/27/2016   INR 1.14 10/19/2015   No results found for: PTT  SOCIAL HISTORY: Social History   Social History  . Marital status: Divorced    Spouse name: N/A  . Number of children: 1  . Years of education: N/A   Occupational History  . Retired Market researcher for [employer] History Main Topics  . Smoking status: Former Smoker    Packs/day: 1.00    Years: 40.00    Quit date: 09/07/1989  . Smokeless tobacco: Never Used  . Alcohol use No  . Drug use: No  . Sexual activity: No   Other Topics Concern  . Not on file   Social History Narrative   Her daughter & 3 grandchildren live in the area.      FAMILY HISTORY: Family History  Problem Relation Age of Onset  . Diabetes Father   . Stroke Father   . Heart disease Father   . Liver cancer Sister   . Heart disease Brother   . Asthma Sister   . Arthritis Sister     REVIEW OF SYSTEMS: Reviewed With patient as per history of present illness.  Psych: Patient denies having dental phobia. Patient, however, does not like the dentist. Skeletal: Patient denies having osteoporosis. Patient denies ever having taken any medication such as Fosamax for osteoporosis. Respiratory: Patient has significant shortness of breath.  DENTAL HISTORY: CHIEF COMPLAINT: Patient was referred by Dr. Cyndia Bent for a dental consultation.  HPI: Patricia [name] is a 80 year old female recently diagnosed with severe aortic stenosis. Patient with anticipated aortic valve replacement with Dr. Cyndia Bent. Patient is now seen as  part of a pre-heart  valve surgery dental protocol examination to rule out dental infection that may affect the patient's systemic health and anticipated heart valve surgery.  The patient currently denies acute toothaches, swellings, or abscesses. Patient was last seen"a long time ago".  The last treatment she can remember was for partial denture fabrication in 2002 with Dr. Levora Dredge. The patient had an upper and lower partial denture fabricated at that time. Patient currently indicates that the partial dentures are " loose."  Patient denies having dental phobia. Patient, however, does not like dentists. Patient indicates that she does have a severe gag reflex.  DENTAL EXAMINATION: GENERAL:Patient is a well-developed, well-nourished female in no acute distress. Patient presents with oxygen via nasal cannula at 2L/minute. HEAD AND NECK: There is no palpable neck lymphadenopathy. The patient denies acute TMJ symptoms. Patient has a maximum interincisal opening of 45 mm measured from edentulous ridge to the lower teeth.  INTRAORAL EXAM: The patient has normal saliva. Patient has a deep palatal vault. There is no evidence of oral abscess formation. There is atrophy of the edentulous alveolar ridges.  DENTITION: Patient is missing tooth numbers 1, 5, 7-10, 12, 13, 16, 17-21, and 27 - 32.  PERIODONTAL: The patient has chronic periodontitis with plaque accumulations, generalized gingival recession, and tooth mobility as per dental charting form. There is incipient to moderate bone loss noted.  DENTAL CARIES/SUBOPTIMAL RESTORATIONS:Patient has evidence of incipient dental caries on tooth #25, and multiple areas of attrition and several flexure lesions are noted.  PROSTHODONTIC:  Patient has upper and lower cast partial dentures that are ill fitting. There is a broken clasp on the lower left of the partial denture. The denture teeth are worn.  CROWN AND BRIDGE:  Patient has a bridge from tooth numbers 11 through  14. The bridge has plus mobility. The margin of the mesial of #11 is less than ideal . ENDODONTIC:  The patient denies acute pulpitis symptoms. I do not see any evidence of periapical pathology or radiolucency. OCCLUSION: Patient has a poor occlusal scheme secondary to multiple missing teeth, supra-eruption and drifting of the unopposed teeth into the edentulous areas, and ill-fitting partial dentures.  RADIOGRAPHIC INTERPRETATION: An orthopantogram was taken. Patient would only allow one additional lower periapical radiograph due to severe gag reflex. There are multiple missing teeth. There is supra-eruption and drifting of the unopposed teeth into the edentulous areas. Multiple diastemas are noted. Dental caries are noted. There is incipient to moderate bone loss noted. There is atrophy of the edentulous alveolar ridges. There is decreased density of the axilla and mandible consistent with probable osteoporosis.  There are no obvious periapical radiolucencies. Tthere is evidence of mandibular interincisal attrition.   ASSESSMENTS: 1. Severe aortic stenosis 2. Pre-heart valve surgery dental protocol examination 3. Chronic periodontitis with bone loss 4. Gingival recession 5. Accretions 6. Tooth mobility 7. Dental caries 8. Excessive attrition 9. Multiple flexure lesions 10. Multiple missing teeth 11. Supra-eruption and drifting of the unopposed teeth into the edentulous areas 12. Multiple diastemas 13. Ill-fitting maxillary and mandibular partial dentures 14. Poor occlusal scheme and malocclusion 15. Patient with questionable need for antibiotic premedication prior to invasive dental procedures due to previous shoulder surgery.  16. Significant risk for complications with invasive dental procedures due to cardiovascular and respiratory compromise.  17. Severe gag reflex   PLAN/RECOMMENDATIONS: 1. I discussed the risks, benefits, and complications of various treatment options with the  patient in relationship to her medical and dental conditions, anticipated heart valve surgery, and  risk for endocarditis. We discussed various treatment options to include no treatment, multiple extractions with alveoloplasty, pre-prosthetic surgery as indicated, periodontal therapy, dental restorations, root canal therapy, crown and bridge therapy, implant therapy, and replacement of missing teeth as indicated. The patient currently wishes to defer any dental treatment at this time and will follow-up with a general dentist of her choice for exam, cleaning, and additional dental treatment as needed once medically stable from the anticipated heart valve surgery. The patient did agree to use chlorhexidine gluconate rinses on a twice a day basis to assist in disinfection of the oral cavity. This prescription was sent to the [facility] of her choice with PRN refills. The patient is aware that she will need antibiotic premedication prior to invasive dental procedures as per American Heart Association guidelines after the anticipated aortic valve replacement surgery.   2. Discussion of findings with medical team and coordination of future medical and dental care as needed.Patient is currently cleared for heart valve surgery.   I spent in excess of  120 minutes during the conduct of this consultation and >50% of this time involved direct face-to-face encounter for counseling and/or coordination of the patient's care.    Lenn Cal, DDS

## 2016-09-22 NOTE — Patient Instructions (Signed)
Patricia [name]    Department of Dental Medicine     DR. Josimar Corning      HEART VALVES AND MOUTH CARE:  FACTS:   If you have any infection in your mouth, it can infect your heart valve.  If you heart valve is infected, you will be seriously ill.  Infections in the mouth can be SILENT and do not always cause pain.  Examples of infections in the mouth are gum disease, dental cavities, and abscesses.  Some possible signs of infection are: Bad breath, bleeding gums, or teeth that are sensitive to sweets, hot, and/or cold. There are many other signs as well.  WHAT YOU HAVE TO DO:   Brush your teeth after meals and at bedtime. Spend at least 2 minutes brushing well, especially behind your back teeth and all around your teeth that stand alone. Brush at the gumline also.  Do not go to bed without brushing your teeth and flossing.  If you gums bleed when you brush or floss, do NOT stop brushing or flossing. It usually means that your gums need more attention and better cleaning.   If your Dentist or Dr. [name] gave you a prescription mouthwash to use, make sure to use it as directed. If you run out of the medication, get a refill at the pharmacy.   If you were given any other medications or directions by your Dentist, please follow them. If you did not understand the directions or forget what you were told, please call. We will be happy to refresh her memory.  If you need antibiotics before dental procedures, make sure you take them one hour prior to every dental visit as directed.   Get a dental checkup every 4-6 months in order to keep your mouth healthy, or to find and treat any new infection. You will most likely need your teeth cleaned or gums treated at the same time.  If you are not able to come in for your scheduled appointment, call your Dentist as soon as possible to reschedule.  If you have a problem in between dental visits, call your Dentist.

## 2016-09-23 LAB — COMPLETE METABOLIC PANEL WITH GFR
ALT: 29 U/L (ref 6–29)
AST: 22 U/L (ref 10–35)
Albumin: 3.9 g/dL (ref 3.6–5.1)
Alkaline Phosphatase: 69 U/L (ref 33–130)
BUN: 31 mg/dL — ABNORMAL HIGH (ref 7–25)
CO2: 32 mmol/L — ABNORMAL HIGH (ref 20–31)
Calcium: 9.1 mg/dL (ref 8.6–10.4)
Chloride: 98 mmol/L (ref 98–110)
Creat: 1.39 mg/dL — ABNORMAL HIGH (ref 0.60–0.93)
GFR, Est African American: 42 mL/min — ABNORMAL LOW (ref >=60)
GFR, Est Non African American: 36 mL/min — ABNORMAL LOW (ref >=60)
Glucose, Bld: 120 mg/dL — ABNORMAL HIGH (ref 65–99)
Potassium: 4 mmol/L (ref 3.5–5.3)
Sodium: 140 mmol/L (ref 135–146)
Total Bilirubin: 0.3 mg/dL (ref 0.2–1.2)
Total Protein: 6.9 g/dL (ref 6.1–8.1)

## 2016-09-24 ENCOUNTER — Ambulatory Visit (HOSPITAL_COMMUNITY): Payer: Medicare Other

## 2016-09-24 ENCOUNTER — Ambulatory Visit (HOSPITAL_COMMUNITY)
Admission: RE | Admit: 2016-09-24 | Discharge: 2016-09-24 | Disposition: A | Discharge status: Home / Self Care | Payer: Medicare Other | Source: Ambulatory Visit | Attending: Surgery | Admitting: Surgery

## 2016-09-24 ENCOUNTER — Ambulatory Visit: Payer: Medicare Other | Admitting: Physical Therapy

## 2016-09-24 DIAGNOSIS — I350 Nonrheumatic aortic (valve) stenosis: Secondary | ICD-10-CM | POA: Diagnosis not present

## 2016-09-24 DIAGNOSIS — Z952 Presence of prosthetic heart valve: Secondary | ICD-10-CM | POA: Diagnosis not present

## 2016-09-24 DIAGNOSIS — I700 Atherosclerosis of aorta: Secondary | ICD-10-CM | POA: Diagnosis not present

## 2016-09-24 DIAGNOSIS — Z01818 Encounter for other preprocedural examination: Secondary | ICD-10-CM | POA: Diagnosis not present

## 2016-09-24 DIAGNOSIS — R911 Solitary pulmonary nodule: Secondary | ICD-10-CM | POA: Insufficient documentation

## 2016-09-24 DIAGNOSIS — K5730 Diverticulosis of large intestine without perforation or abscess without bleeding: Secondary | ICD-10-CM | POA: Insufficient documentation

## 2016-09-24 DIAGNOSIS — I701 Atherosclerosis of renal artery: Secondary | ICD-10-CM | POA: Insufficient documentation

## 2016-09-24 DIAGNOSIS — N289 Disorder of kidney and ureter, unspecified: Secondary | ICD-10-CM

## 2016-09-24 DIAGNOSIS — I2510 Atherosclerotic heart disease of native coronary artery without angina pectoris: Secondary | ICD-10-CM | POA: Diagnosis not present

## 2016-09-24 MED ORDER — SODIUM BICARBONATE 8.4 % IV SOLN
INTRAVENOUS | Status: AC
  Administered 2016-09-24: 15:00:00 via INTRAVENOUS
  Filled 2016-09-24: qty 500

## 2016-09-24 MED ORDER — SODIUM BICARBONATE BOLUS VIA INFUSION
INTRAVENOUS | Status: AC
  Administered 2016-09-24: 75 meq via INTRAVENOUS
  Filled 2016-09-24: qty 1

## 2016-09-24 MED ORDER — IOPAMIDOL (ISOVUE-370) INJECTION 76%
INTRAVENOUS | Status: AC
  Administered 2016-09-24: 100 mL
  Filled 2016-09-24: qty 100

## 2016-09-30 ENCOUNTER — Encounter: Payer: Self-pay | Admitting: Thoracic Surgery (Cardiothoracic Vascular Surgery)

## 2016-09-30 ENCOUNTER — Institutional Professional Consult (permissible substitution) (INDEPENDENT_AMBULATORY_CARE_PROVIDER_SITE_OTHER): Payer: Medicare Other | Admitting: Thoracic Surgery (Cardiothoracic Vascular Surgery)

## 2016-09-30 VITALS — BP 109/46 | Resp 16 | Ht 63.0 in | Wt 222.0 lb

## 2016-09-30 DIAGNOSIS — I480 Paroxysmal atrial fibrillation: Secondary | ICD-10-CM | POA: Diagnosis not present

## 2016-09-30 DIAGNOSIS — I350 Nonrheumatic aortic (valve) stenosis: Secondary | ICD-10-CM

## 2016-09-30 DIAGNOSIS — I2510 Atherosclerotic heart disease of native coronary artery without angina pectoris: Secondary | ICD-10-CM | POA: Diagnosis not present

## 2016-09-30 NOTE — Progress Notes (Signed)
HEART AND [place] SURGERY CONSULTATION REPORT  Referring Provider is Martinique, [name] M, MD PCP is Jeanmarie Hubert, MD  Chief Complaint  Patient presents with  . Aortic Stenosis    severe..eval for AVR/CABG vs TAVR  . Coronary Artery Disease    single vessel obstructing per CATH    HPI:  Patient is a 80 year old obese white female with history of aortic stenosis, hypertension, atrial fibrillation, coronary artery disease, chronic diastolic congestive heart failure, acute on chronic hypoxic respiratory failure, hyperlipidemia, and GI bleeding with anemia who has been referred for a second surgical consultation to discuss treatment options for management of severe symptomatic aortic stenosis and coronary artery disease.  The patient states that she has been told that she had a heart murmur for many years. She never had it formally evaluated until December 2016 when she presented with acute on chronic shortness of breath with hypoxemia. She was found to be in atrial fibrillation with rapid ventricular response and pulmonary edema consistent with class IV congestive heart failure. Echocardiogram performed at that time revealed normal left ventricular systolic function with ejection fraction estimated 55-60%. There was "moderate to severe" aortic stenosis. She was started on amiodarone and anticoagulated using Xarelto and later underwent TEE guided cardioversion. She has been maintaining sinus rhythm ever since. Xarelto was later stopped because of concerns regarding GI bleeding and chronic anemia. She underwent right shoulder replacement by Dr. Mardelle Matte on 12/18/2015. She required transfusion postoperatively but did not have problems with acute exacerbation of congestive heart failure. She later underwent EGD and colonoscopy revealing gastric and colonic polyps without signs of active bleeding. She has been followed carefully by Dr. Martinique and  underwent follow-up echocardiogram 04/30/2016. Left ventricular systolic function remained normal with ejection fraction estimated 55-60%. Peak velocity across the aortic valve was reported 4.0 m/s corresponding to mean transvalvular gradient estimated 40 mmHg. The DVI was reported 0.31.  She was treated medically but continued to complain of progressive symptoms of exertional shortness of breath and fatigue, despite the fact that she has maintained sinus rhythm. Diagnostic cardiac catheterization was performed 09/02/2016 confirming the presence of severe aortic stenosis with mean transvalvular gradient measured 41 mmHg at catheterization. There was moderate pulmonary hypertension and moderate coronary artery disease with 75% stenosis of the large third obtuse marginal branch of the left circumflex but otherwise mild nonobstructive disease. The patient was referred for surgical consultation and has been evaluated previously by Dr. Cyndia Bent. The patient was felt to be moderate to high risk for conventional surgical aortic valve replacement and transcatheter aortic valve replacement has been discussed as an alternative. The patient has subsequently undergone CT angiography and has been referred for second surgical opinion. Of note, approximately 2 weeks ago the patient was hospitalized briefly for acute exacerbation of chronic diastolic congestive heart failure with hypoxemia. She was diuresed aggressively in the hospital and her home Lasix dose was increased.  She was discharged from the hospital with home oxygen therapy.  The patient is divorced and lives alone in [place] where she remains functionally independent. 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 She has 1 grown daughter who accompanies her to the office for her consultation today. The patient has remained functionally independent all of her adult life and throughout retirement. She admits  to decreased physical activity over the last couple of years primarily because of progressive exertional shortness of breath. The patient  also has problems with chronic pain in her lower back and knees related to degenerative arthritis. She describes a long history of progressive exertional shortness of breath that has gotten considerably worse over the past year. She has been stable since her most recent hospital discharge 2 weeks ago although she still gets short of breath with moderate low level activity. Oxygen therapy seems to help somewhat. She denies any history of PND, orthopnea, palpitations, or syncope. She reports occasional dizzy spells. She has had some lower extremity edema that has improved with increased Lasix. She denies any history of exertional chest pain or chest tightness. Her mobility is also somewhat limited because of arthritis pain in her back and knees. She reports some mild difficulty with balance but she does not use a cane or a walker.   Past Medical History:  Diagnosis Date  . Allergy   . Anemia, unspecified   . Arthritis   . Asthma   . Atrial fibrillation status post cardioversion [facility]) 09/2015   s/p TEE/DCCV>>SR on amio  . Benign neoplasm of colon   . Carpal tunnel syndrome   . Cellulitis and abscess of finger, unspecified   . Cerumen impaction   . Cervicalgia   . Chronic airway obstruction, not elsewhere classified   . Chronic anticoagulation 09/2015   Xarelto for afib, CHADS2VASC=5  . Diarrhea   . Diffuse cystic mastopathy   . Dysrhythmia   . Edema   . Encounter for long-term (current) use of other medications   . GERD (gastroesophageal reflux disease)   . Heart murmur   . Lumbago   . Neck pain 05/23/2015  . Obesity, unspecified   . Other and unspecified hyperlipidemia   . Other psoriasis   . Pain in joint, lower leg   . PONV (postoperative nausea and vomiting)   . Primary localized osteoarthrosis of right shoulder 12/18/2015  . Routine gynecological  examination   . Scoliosis (and kyphoscoliosis), idiopathic   . Severe aortic stenosis   . Shortness of breath dyspnea    with exertion  . Type II or unspecified type diabetes mellitus without mention of complication, uncontrolled   . Unspecified essential hypertension   . Unspecified hemorrhoids without mention of complication   . Unspecified hypothyroidism   . Urge incontinence     Past Surgical History:  Procedure Laterality Date  . ABDOMINAL HYSTERECTOMY  1987   complete  . BREAST SURGERY     right breast 3 surgeries [phone]  . CARDIAC CATHETERIZATION N/A 09/02/2016   Procedure: Right/Left Heart Cath and Coronary Angiography;  Surgeon: [name] M Martinique, MD;  Location: [facility];  Service: Cardiovascular;  Laterality: N/A;  . CARDIOVERSION N/A 10/19/2015   Procedure: CARDIOVERSION;  Surgeon: Lelon Perla, MD;  Location: [facility];  Service: Cardiovascular;  Laterality: N/A;  . [place]  . COLON SURGERY     2004,2007,2013.  3 times colon surgieres  . EXCISE LE MANDIBULAR LYMPH NODE T     DR C. NEWMAN  . FOOT SURGERY  1989  . LEFT SHOULDER ARTHROSCOPY    . 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 NEUROPLASTY / TRANSPOSITION MEDIAN NERVE AT CARPAL TUNNEL BILATERAL  05/2003  . TEE WITHOUT CARDIOVERSION N/A 10/19/2015   Procedure: Transesophageal Echocardiogram (TEE) ;  Surgeon: Lelon Perla, MD;  Location: [facility];  Service: Cardiovascular;  Laterality: N/A;  . TOTAL SHOULDER ARTHROPLASTY Right 12/18/2015   Procedure: TOTAL SHOULDER ARTHROPLASTY;  Surgeon: [name], MD;  Location: [facility];  Service: Orthopedics;  Laterality: Right;  . TRIGGER FINGER RELEASE Left   . VAGINAL CYST REMOVED  1967    Family History  Problem Relation Age of Onset  . Diabetes Father   . Stroke Father   . Heart disease Father   . Liver cancer Sister   . Heart disease Brother   . Asthma Sister   . Arthritis Sister     Social History   Social  History  . Marital status: Divorced    Spouse name: N/A  . Number of children: 1  . Years of education: N/A   Occupational History  . Retired Market researcher for [employer] History Main Topics  . Smoking status: Former Smoker    Packs/day: 1.00    Years: 40.00    Quit date: 09/07/1989  . Smokeless tobacco: Never Used  . Alcohol use No  . Drug use: No  . Sexual activity: No   Other Topics Concern  . Not on file   Social History Narrative   Her daughter & 3 grandchildren live in the area.      Current Outpatient Prescriptions  Medication Sig Dispense Refill  . acetaminophen (TYLENOL) 500 MG tablet Take 500 mg by mouth every 6 (six) hours as needed for mild pain.    Marland Kitchen ADVAIR DISKUS 100-50 MCG/DOSE AEPB Inhale 1 inhalation two  times daily for breathing 180 each 3  . albuterol (PROVENTIL HFA;VENTOLIN HFA) 108 (90 Base) MCG/ACT inhaler Inhale 2 puffs into the lungs every 6 (six) hours as needed for wheezing or shortness of breath.    Marland Kitchen amiodarone (PACERONE) 200 MG tablet Take one tablet daily by mouth 90 tablet 3  . aspirin EC 81 MG EC tablet Take 1 tablet (81 mg total) by mouth daily. 30 tablet 0  . cetirizine (ZYRTEC) 10 MG tablet Take 10 mg by mouth daily as needed for allergies.     . chlorhexidine (PERIDEX) 0.12 % solution Rinse with 15 mls twice daily for 30 seconds. Use after breakfast and at bedtime. Spit out excess. Do not swallow. 480 mL prn  . cholecalciferol (VITAMIN D) 1000 units tablet One daily for vitamin D supplement 100 tablet 4  . COMFORT LANCETS MISC Use daily to get blood for glucometer 100 each 3  . diltiazem (CARDIZEM CD) 180 MG 24 hr capsule Take 1 capsule by mouth  once a day to control blood pressure 90 capsule 3  . doxazosin (CARDURA) 4 MG tablet TAKE 1 TABLET BY MOUTH ONCE DAILY TO HELP CONTROL BLOOD PRESSURE 90 tablet 0  . ferrous sulfate 325 (65 FE) MG EC tablet Take 1 tablet (325 mg total) by mouth daily with breakfast. 30 tablet 6  .  furosemide (LASIX) 40 MG tablet Take 1 tablet (40 mg total) by mouth daily. 30 tablet 0  . glimepiride (AMARYL) 2 MG tablet Take 1 tablet by mouth at  breakfast to control  Glucose 90 tablet 0  . glucose blood (BAYER CONTOUR TEST) test strip Use daily too check glucose 100 each 4  . levothyroxine (SYNTHROID, LEVOTHROID) 50 MCG tablet Take 1 tablet by mouth  daily for thyroid  supplement 90 tablet 1  . metFORMIN (GLUCOPHAGE) 500 MG tablet Take 500 mg by mouth 2 (two) times daily with a meal.    . pantoprazole (PROTONIX) 40 MG tablet Take 1 tablet (40 mg total) by mouth daily. 90 tablet 1  . potassium gluconate 595 (99 K) MG TABS  tablet Take 595 mg by mouth every other day. TAKES ONLY WHEN USING FUROSEMIDE    . pravastatin (PRAVACHOL) 20 MG tablet TAKE 1 TABLET BY MOUTH  DAILY 90 tablet 1  . triamcinolone (KENALOG) 0.025 % cream Apply 1 application topically daily as needed (psoriasis).      No current facility-administered medications for this visit.     Allergies  Allergen Reactions  . Codeine Other (See Comments)    "spaces her out, dizziness, can't walk well"  . Vesicare [Solifenacin] Itching      Review of Systems:   General:  normal appetite, normal energy, no weight gain, no weight loss, no fever  Cardiac:  no chest pain with exertion, no chest pain at rest, + SOB with exertion, no resting SOB, no PND, no orthopnea, no palpitations, no arrhythmia, + atrial fibrillation, + LE edema, + dizzy spells, no syncope  Respiratory:  + shortness of breath, + home oxygen, no productive cough, no dry cough, no bronchitis, + wheezing, no hemoptysis, + asthma, no pain with inspiration or cough, no sleep apnea, no CPAP at night  GI:   no difficulty swallowing, no reflux, no frequent heartburn, no hiatal hernia, no abdominal pain, no constipation, no diarrhea, no hematochezia, no hematemesis, no melena  GU:   no dysuria,  no frequency, no urinary tract infection, no hematuria, no kidney stones, no  kidney disease  Vascular:  no pain suggestive of claudication, no pain in feet, no leg cramps, no varicose veins, no DVT, no non-healing foot ulcer  Neuro:   no stroke, no TIA's, no seizures, no headaches, no temporary blindness one eye,  no slurred speech, no peripheral neuropathy, no chronic pain, mild instability of gait, no memory/cognitive dysfunction  Musculoskeletal: + arthritis, + joint swelling, no myalgias, mild difficulty walking, somewhat decreased mobility   Skin:   no rash, no itching, no skin infections, no pressure sores or ulcerations  Psych:   no anxiety, no depression, no nervousness, no unusual recent stress  Eyes:   no blurry vision, no floaters, no recent vision changes, + wears glasses or contacts  ENT:   no hearing loss, no loose or painful teeth, + dentures  Hematologic:  + easy bruising, no abnormal bleeding, no clotting disorder, no frequent epistaxis  Endocrine:  + diabetes, does check CBG's at home           Physical Exam:   BP (!) 109/46 (BP Location: Left Arm, Patient Position: Sitting, Cuff Size: Large)   Resp 16   Ht 5' 3" (1.6 m)   Wt 222 lb (100.7 kg)   SpO2 93% Comment: ON 2L O2  BMI 39.33 kg/m   General:  Moderately obese,  well-appearing  HEENT:  Unremarkable   Neck:   no JVD, no bruits, no adenopathy   Chest:   clear to auscultation, symmetrical breath sounds, no wheezes, no rhonchi   CV:   RRR, grade IV/VI crescendo/decrescendo murmur heard best at RSB,  no diastolic murmur  Abdomen:  soft, non-tender, no masses   Extremities:  warm, well-perfused, pulses diminished, trace LE edema  Rectal/GU  Deferred  Neuro:   Grossly non-focal and symmetrical throughout  Skin:   Clean and dry, no rashes, no breakdown   Diagnostic Tests:  Transthoracic Echocardiography  Patient:    Patricia [name], Patricia [name] MR #:       [id number] Study Date: 04/30/2016 Gender:     F Age:        76 Height:  160 cm Weight:     96.6 kg BSA:        2.12 m^2 Pt.  Status: Room:   ORDERING     [name] Martinique, M.D.  REFERRING    [name] Martinique, M.D.  ATTENDING    Sanda Klein, MD  SONOGRAPHER  Marygrace Drought, RCS  PERFORMING   [facility], Outpatient  cc:  ------------------------------------------------------------------- LV EF: 55% -   60%  ------------------------------------------------------------------- Indications:      Aortic Stenosis (I35.0).  ------------------------------------------------------------------- History:   PMH:   Atrial fibrillation.  Risk factors: Hypertension. Diabetes mellitus.  ------------------------------------------------------------------- Study Conclusions  - Left ventricle: The cavity size was normal. There was moderate   concentric hypertrophy. Systolic function was normal. The   estimated ejection fraction was in the range of 55% to 60%. Wall   motion was normal; there were no regional wall motion   abnormalities. Features are consistent with a pseudonormal left   ventricular filling pattern, with concomitant abnormal relaxation   and increased filling pressure (grade 2 diastolic dysfunction). - Aortic valve: There was moderate to severe stenosis. Mean   gradient (S): 40 mm Hg. Peak gradient (S): 65 mm Hg. Valve area   (VTI): 1.07 cm^2. Valve area (Vmax): 1 cm^2. Valve area (Vmean):   0.94 cm^2. - Mitral valve: Calcified annulus. Mildly thickened leaflets .   There was mild regurgitation. Valve area by continuity equation   (using LVOT flow): 1.88 cm^2. - Left atrium: The atrium was moderately to severely dilated. - Pulmonary arteries: Systolic pressure was mildly increased. PA   peak pressure: 39 mm Hg (S).  Transthoracic echocardiography.  M-mode, complete 2D, spectral Doppler, and color Doppler.  Birthdate:  Patient birthdate: Mar 18, 1936.  Age:  Patient is 80 yr old.  Sex:  Gender: female. BMI: 37.7 kg/m^2.  Blood pressure:     196/79  Patient status: Outpatient.  Study date:  Study date:  04/30/2016. Study time: 10:05 AM.  Location:   [facility]  -------------------------------------------------------------------  ------------------------------------------------------------------- Left ventricle:  The cavity size was normal. There was moderate concentric hypertrophy. Systolic function was normal. The estimated ejection fraction was in the range of 55% to 60%. Wall motion was normal; there were no regional wall motion abnormalities. Features are consistent with a pseudonormal left ventricular filling pattern, with concomitant abnormal relaxation and increased filling pressure (grade 2 diastolic dysfunction).  ------------------------------------------------------------------- Aortic valve:   Moderately thickened, moderately calcified leaflets.  Doppler:   There was moderate to severe stenosis. VTI ratio of LVOT to aortic valve: 0.33. Valve area (VTI): 1.07 cm^2. Indexed valve area (VTI): 0.5 cm^2/m^2. Peak velocity ratio of LVOT to aortic valve: 0.31. Valve area (Vmax): 1 cm^2. Indexed valve area (Vmax): 0.47 cm^2/m^2. Mean velocity ratio of LVOT to aortic valve: 0.29. Valve area (Vmean): 0.94 cm^2. Indexed valve area (Vmean): 0.44 cm^2/m^2.    Mean gradient (S): 40 mm Hg. Peak gradient (S): 65 mm Hg.  ------------------------------------------------------------------- Aorta:  Aortic root: The aortic root was normal in size. Ascending aorta: The ascending aorta was normal in size.  ------------------------------------------------------------------- Mitral valve:   Calcified annulus. Mildly thickened leaflets . Leaflet separation was normal.  Doppler:  Transvalvular velocity was within the normal range. There was no evidence for stenosis. There was mild regurgitation.    Valve area by pressure half-time: 3.28 cm^2. Indexed valve area by pressure half-time: 1.55 cm^2/m^2. Valve area by continuity equation (using LVOT flow): 1.88 cm^2. Indexed valve area by  continuity equation (using LVOT flow): 0.89 cm^2/m^2.  Mean gradient (D): 4 mm Hg. Peak gradient (D): 8 mm Hg.  ------------------------------------------------------------------- Left atrium:  The atrium was moderately to severely dilated.   ------------------------------------------------------------------- Right ventricle:  The cavity size was normal. Systolic function was normal.  ------------------------------------------------------------------- Pulmonic valve:   Poorly visualized.  The valve appears to be grossly normal.  ------------------------------------------------------------------- Pulmonary artery:   Systolic pressure was mildly increased.  ------------------------------------------------------------------- Right atrium:  The atrium was normal in size.  ------------------------------------------------------------------- Pericardium:  There was no pericardial effusion.  ------------------------------------------------------------------- Systemic veins: Inferior vena cava: The vessel was normal in size. The respirophasic diameter changes were in the normal range (= 50%), consistent with normal central venous pressure. Diameter: 20 mm.  ------------------------------------------------------------------- Measurements   IVC                                      Value           Reference  ID                                       20     mm       ---------    Left ventricle                           Value           Reference  LV ID, ED, PLAX chordal                  47.8   mm       43 - 52  LV ID, ES, PLAX chordal                  30.2   mm       23 - 38  LV fx shortening, PLAX chordal           37     %        >=29  LV PW thickness, ED                      15.8   mm       ---------  IVS/LV PW ratio, ED                      1.07            <=1.3  Stroke volume, 2D                        77     ml       ---------  Stroke volume/bsa, 2D                    36      ml/m^2   ---------  LV ejection fraction, 1-p A4C            54     %        ---------  LV e&', lateral                           5.48   cm/s     ---------  LV E/e&', lateral  25.91           ---------  LV e&', medial                            7.35   cm/s     ---------  LV E/e&', medial                          19.32           ---------  LV e&', average                           6.42   cm/s     ---------  LV E/e&', average                         22.14           ---------    Ventricular septum                       Value           Reference  IVS thickness, ED                        16.9   mm       ---------    LVOT                                     Value           Reference  LVOT ID, S                               20.3   mm       ---------  LVOT area                                3.24   cm^2     ---------  LVOT peak velocity, S                    119.94 cm/s     ---------  LVOT mean velocity, S                    87.2   cm/s     ---------  LVOT VTI, S                              34.2   cm       ---------  LVOT peak gradient, S                    6      mm Hg    ---------  Stroke volume (SV), LVOT DP              110.7  ml       ---------  Stroke index (SV/bsa), LVOT DP           52.2   ml/m^2   ---------    Aortic valve  Value           Reference  Aortic valve peak velocity, S            404    cm/s     ---------  Aortic valve mean velocity, S            301    cm/s     ---------  Aortic valve VTI, S                      106    cm       ---------  Aortic mean gradient, S                  40     mm Hg    ---------  Aortic peak gradient, S                  65     mm Hg    ---------  VTI ratio, LVOT/AV                       0.33            ---------  Aortic valve area, VTI                   1.07   cm^2     ---------  Aortic valve area/bsa, VTI               0.5    cm^2/m^2 ---------  Velocity ratio, peak, LVOT/AV            0.31             ---------  Aortic valve area, peak velocity         1      cm^2     ---------  Aortic valve area/bsa, peak              0.47   cm^2/m^2 ---------  velocity  Velocity ratio, mean, LVOT/AV            0.29            ---------  Aortic valve area, mean velocity         0.94   cm^2     ---------  Aortic valve area/bsa, mean              0.44   cm^2/m^2 ---------  velocity  Aortic regurg pressure half-time         147    ms       ---------    Aorta                                    Value           Reference  Aortic root ID, ED                       24     mm       ---------    Left atrium                              Value           Reference  LA ID, A-P, ES  49     mm       ---------  LA ID/bsa, A-P                   (H)     2.31   cm/m^2   <=2.2  LA volume, S                             96     ml       ---------  LA volume/bsa, S                         45.3   ml/m^2   ---------  LA volume, ES, 1-p A4C                   75     ml       ---------  LA volume/bsa, ES, 1-p A4C               35.4   ml/m^2   ---------  LA volume, ES, 1-p A2C                   113    ml       ---------  LA volume/bsa, ES, 1-p A2C               53.3   ml/m^2   ---------    Mitral valve                             Value           Reference  Mitral E-wave peak velocity              142    cm/s     ---------  Mitral A-wave peak velocity              117    cm/s     ---------  Mitral mean velocity, D                  91.4   cm/s     ---------  Mitral deceleration time                 225    ms       150 - 230  Mitral pressure half-time                49     ms       ---------  Mitral mean gradient, D                  4      mm Hg    ---------  Mitral peak gradient, D                  8      mm Hg    ---------  Mitral E/A ratio, peak                   1.2             ---------  Mitral valve area, PHT, DP               3.28   cm^2     ---------  Mitral valve area/bsa, PHT, DP  1.55   cm^2/m^2 ---------  Mitral valve area, LVOT                  1.88   cm^2     ---------  continuity  Mitral valve area/bsa, LVOT              0.89   cm^2/m^2 ---------  continuity  Mitral annulus VTI, D                    40.9   cm       ---------    Pulmonary arteries                       Value           Reference  PA pressure, S, DP               (H)     39     mm Hg    <=30    Tricuspid valve                          Value           Reference  Tricuspid regurg peak velocity           302    cm/s     ---------  Tricuspid peak RV-RA gradient            36     mm Hg    ---------  Tricuspid maximal regurg                 302    cm/s     ---------  velocity, PISA    Systemic veins                           Value           Reference  Estimated CVP                            3      mm Hg    ---------    Right ventricle                          Value           Reference  RV pressure, S, DP               (H)     39     mm Hg    <=30  Legend: (L)  and  (H)  mark values outside specified reference range.  ------------------------------------------------------------------- Prepared and Electronically Authenticated by  Sanda Klein, MD 2017-07-05T13:43:25   Right/Left Heart Cath and Coronary Angiography  Conclusion     LV end diastolic pressure is moderately elevated.  There is severe aortic valve stenosis.  There is no mitral valve stenosis.  Prox LAD to Mid LAD lesion, 20 %stenosed.  Ost 3rd Mrg to 3rd Mrg lesion, 75 %stenosed.  Ost 2nd Mrg to 2nd Mrg lesion, 30 %stenosed.  Ost RCA to Dist RCA lesion, 10 %stenosed.  Hemodynamic findings consistent with mild pulmonary hypertension.   1. Single vessel obstructive CAD involving OM3 2. Moderate to severe aortic stenosis. Mean gradient 41 mm Hg, valve index 0.7 3. Mild pulmonary HTN 4. Elevated LV filling pressures.  Prominent V waves on PCWP tracings c/w decreased LV compliance.  5. Normal cardiac output.    Plan: recommend consideration for AVR/ single vessel CABG.    Indications   Nonrheumatic aortic valve stenosis [I35.0 (ICD-10-CM)]  Procedural Details/Technique   Technical Details Indication: 80 yo WF with mod-severe aortic stenosis, progressive. Now with symptoms of CHF.  Procedural Details: The right groin was prepped, draped, and anesthetized with 1% lidocaine. Using the modified Seldinger technique a 5 Fr sheath was placed in the right femoral artery and a 7French sheath was placed in the right femoral vein. A Swan-Ganz catheter was used for the right heart catheterization. Standard protocol was followed for recording of right heart pressures and sampling of oxygen saturations. Fick cardiac output was calculated. Both right and left pulmonary wedge pressures were recorded and were equal. Standard Judkins catheters were used for selective coronary angiography and left ventricular pressures. There were no immediate procedural complications. The patient was transferred to the post catheterization recovery area for further monitoring.  Contrast: 50 cc   Estimated blood loss <50 mL.  During this procedure the patient was administered the following to achieve and maintain moderate conscious sedation: Versed 2 mg, Fentanyl 25 mcg, while the patient's heart rate, blood pressure, and oxygen saturation were continuously monitored. The period of conscious sedation was 43 minutes, of which I was present face-to-face 100% of this time.    Complications   Complications documented before study signed (09/02/2016 10:47 AM EST)    No complications were associated with this study.  Documented by [name] M Martinique, MD - 09/02/2016 10:47 AM EST    Coronary Findings   Dominance: Right  Left Anterior Descending  Prox LAD to Mid LAD lesion, 20% stenosed. The lesion is moderately calcified.  First Diagonal Branch  Vessel is large in size.  Second Diagonal Branch  Vessel is small in size.  Left  Circumflex  Second Obtuse Marginal Branch  Ost 2nd Mrg to 2nd Mrg lesion, 30% stenosed.  Third Obtuse Marginal Branch  Ost 3rd Mrg to 3rd Mrg lesion, 75% stenosed.  Right Coronary Artery  Ost RCA to Dist RCA lesion, 10% stenosed.  Right Heart   Right Heart Pressures Hemodynamic findings consistent with mild pulmonary hypertension. Elevated LV EDP consistent with volume overload. Prominent V waves    Left Heart   Left Ventricle LV end diastolic pressure is moderately elevated.    Mitral Valve There is no mitral valve stenosis.    Aortic Valve There is severe aortic valve stenosis. The aortic valve is calcified.    Coronary Diagrams   Diagnostic Diagram     Implants     No implant documentation for this case.  PACS Images   Show images for Cardiac catheterization   Link to Procedure Log   Procedure Log    Hemo Data   Flowsheet Row Most Recent Value  Fick Cardiac Output 7.76 L/min  Fick Cardiac Output Index 3.92 (L/min)/BSA  Aortic Mean Gradient 41.4 mmHg  Aortic Peak Gradient 45 mmHg  Aortic Valve Area 1.46  Aortic Value Area Index 0.74 cm2/BSA  RA A Wave 18 mmHg  RA V Wave 14 mmHg  RA Mean 14 mmHg  RV Systolic Pressure 65 mmHg  RV Diastolic Pressure 11 mmHg  RV EDP 22 mmHg  PA Systolic Pressure 52 mmHg  PA Diastolic Pressure 23 mmHg  PA Mean 37 mmHg  PW A Wave 26 mmHg  PW V Wave 40 mmHg  PW Mean 26 mmHg  AO Systolic Pressure 099 mmHg  AO Diastolic Pressure 64 mmHg  AO Mean 833 mmHg  LV Systolic Pressure 825 mmHg  LV Diastolic Pressure 15 mmHg  LV EDP 27 mmHg  Arterial Occlusion Pressure Extended Systolic Pressure 053 mmHg  Arterial Occlusion Pressure Extended Diastolic Pressure 66 mmHg  Arterial Occlusion Pressure Extended Mean Pressure 110 mmHg  Left Ventricular Apex Extended Systolic Pressure 976 mmHg  Left Ventricular Apex Extended Diastolic Pressure 19 mmHg  Left Ventricular Apex Extended EDP Pressure 33 mmHg  QP/QS 1  TPVR Index 9.45 HRUI   TSVR Index 27.06 HRUI  PVR SVR Ratio 0.12  TPVR/TSVR Ratio 0.35    Cardiac TAVR CT  TECHNIQUE: The patient was scanned on a Philips 256 scanner. A 120 kV retrospective scan was triggered in the descending thoracic aorta at 111 HU's. Gantry rotation speed was 270 msecs and collimation was .9 mm. 10 mg of iv metoprolol and no nitro were given. The 3D data set was reconstructed in 5% intervals of the R-R cycle. Systolic and diastolic phases were analyzed on a dedicated work station using MPR, MIP and VRT modes. The patient received 80 cc of contrast.  FINDINGS: Aortic Valve: Trileaflet, moderately thickened, moderately calcified aortic valve with severely restricted leaflet opening. There are only mild calcifications extending into the LVOT.  Aorta: Normal caliber. Mild diffuse calcifications in the sinotubular junction, aortic arch and descending aorta. No dissection.  Sinotubular Junction:  25 x 23 mm  Ascending Thoracic Aorta:  27 x 26 mm  Aortic Arch:  21 x 21 mm  Descending Thoracic Aorta:  24 x 24 mm  Sinus of Valsalva Measurements:  Non-coronary:  30 mm  Right -coronary:  26 mm  Left -coronary:  28 mm  Coronary Artery Height above Annulus:  Left Main:  9.5 mm  Right Coronary:  15 mm  Virtual Basal Annulus Measurements:  Maximum/Minimum Diameter:  26 x 18 mm  Perimeter:  86 mm  Area:  356 mm2  Coronary Arteries: Normal origin, the study is insufficient for the coronary evaluation.  Optimum Fluoroscopic Angle for Delivery:  RAO 3 CRA 3  Normal pulmonary vein drainage into the left atrium.  No thrombus in the large left atrial appendage.  IMPRESSION: 1. Trileaflet, moderately thickened, moderately calcified aortic valve with severely restricted leaflet opening and only mild calcifications extending into the LVOT. The annular measurements suitable for delivery of a 23 mm Edwards-SAPIEN TAVR 3 valve.  2.  Sufficient  annular to coronary distance.  3. Optimum Fluoroscopic Angle for Delivery:  RAO 3 CRA 3  Ena Dawley   Electronically Signed   By: Ena Dawley   On: 09/12/2016 17:17        CT ANGIOGRAPHY CHEST, ABDOMEN AND PELVIS  TECHNIQUE: Multidetector CT imaging through the chest, abdomen and pelvis was performed using the standard protocol during bolus administration of intravenous contrast. Multiplanar reconstructed images and MIPs were obtained and reviewed to evaluate the vascular anatomy.  CONTRAST:  100 mL of Isovue 370.  COMPARISON:  Cardiac CTA 09/12/2016.  FINDINGS: CTA CHEST FINDINGS  Cardiovascular: Heart size is enlarged with moderate left atrial dilatation. There is no significant pericardial fluid, thickening or pericardial calcification. There is aortic atherosclerosis, as well as atherosclerosis of the great vessels of the mediastinum and the coronary arteries, including calcified atherosclerotic plaque in the left main, left anterior descending, left circumflex and right coronary arteries. Thickening calcification of the aortic valve. Calcification of the mitral annulus, particularly inferiorly. Bovine type thoracic  aortic arch (normal anatomical variant) incidentally noted.  Mediastinum/Lymph Nodes: No pathologically enlarged mediastinal or hilar lymph nodes. Esophagus is unremarkable in appearance. No axillary lymphadenopathy.  Lungs/Pleura: 5 mm pulmonary nodule in the inferior segment of the lingula (image 80 of series 7). No other larger more suspicious appearing pulmonary nodules or masses are noted. Scattered areas of subsegmental atelectasis and/or scarring are noted throughout the periphery of the lungs bilaterally. No acute consolidative airspace disease. No pleural effusions.  Musculoskeletal/Soft Tissues: There are no aggressive appearing lytic or blastic lesions noted in the visualized portions of the skeleton. Status post  right shoulder arthroplasty.  CTA ABDOMEN AND PELVIS FINDINGS  Hepatobiliary: 1.6 cm well-defined low-attenuation lesion in the periphery of segment 8 of the liver is compatible with a simple cysts. No other suspicious hepatic lesions. No intra or extrahepatic biliary ductal dilatation. Status post cholecystectomy.  Pancreas: No pancreatic mass. No pancreatic ductal dilatation. No pancreatic or peripancreatic fluid or inflammatory changes.  Spleen: Unremarkable.  Adrenals/Urinary Tract: Bilateral adrenal glands and bilateral kidneys are normal in appearance. No hydroureteronephrosis. Urinary bladder is normal in appearance.  Stomach/Bowel: Normal appearance of the stomach. There is no pathologic dilatation of small bowel or colon. A few scattered colonic diverticulae are noted, without surrounding inflammatory changes to suggest an acute diverticulitis at this time. Normal appendix.  Vascular/Lymphatic: Aortic atherosclerosis with vascular findings and measurements pertinent to potential TAVR procedure, as detailed below. No aneurysm or dissection in the abdominal or pelvic vasculature. Celiac axis, superior mesenteric artery and inferior mesenteric artery are all widely patent. Single renal arteries bilaterally are patent, although there appears to be mild moderate stenosis at the ostium of both renal arteries. No lymphadenopathy noted in the abdomen or pelvis.  Reproductive: Status post hysterectomy. Ovaries are not confidently identified may be surgically absent or atrophic.  Other: Tiny umbilical hernia containing only omental fat incidentally noted. No significant volume of ascites. No pneumoperitoneum.  Musculoskeletal: There are no aggressive appearing lytic or blastic lesions noted in the visualized portions of the skeleton.  VASCULAR MEASUREMENTS PERTINENT TO TAVR:  AORTA:  Minimal Aortic Diameter -  12 x 9 mm  Severity of Aortic Calcification -   moderate to severe  RIGHT PELVIS:  Right Common Iliac Artery -  Minimal Diameter - 5.0 x 3.8 mm  Tortuosity - mild  Calcification - moderate  Right External Iliac Artery -  Minimal Diameter - 6.8 x 7.0 mm  Tortuosity - mild  Calcification - minimal  Right Common Femoral Artery -  Minimal Diameter - 6.9 x 6.2 mm  Tortuosity - mild  Calcification - minimal  LEFT PELVIS:  Left Common Iliac Artery -  Minimal Diameter - 6.4 x 6.6 mm  Tortuosity - mild  Calcification - moderate  Left External Iliac Artery -  Minimal Diameter - 6.5 x 6.6 mm  Tortuosity - mild to moderate  Calcification - none  Left Common Femoral Artery -  Minimal Diameter - 7.4 x 5.0 mm  Tortuosity - mild  Calcification - mild  Review of the MIP images confirms the above findings.  IMPRESSION: 1. This patient does have suitable pelvic arterial access on the left side, as detailed above. 2. Thickening and calcification of the aortic valve, compatible with the reported clinical history of severe aortic stenosis. 3. Cardiomegaly with moderate left atrial dilatation. 4. Aortic atherosclerosis, in addition to left main and 3 vessel coronary artery disease. Assessment for potential risk factor modification, dietary therapy or pharmacologic therapy may be warranted,  if clinically indicated. 5. In addition, there appears to be mild to moderate stenosis at the ostium of the renal arteries bilaterally. 6. 5 mm pulmonary nodule in the inferior segment of the lingula is nonspecific. No follow-up needed if patient is low-risk. Non-contrast chest CT can be considered in 12 months if patient is high-risk. This recommendation follows the consensus statement: Guidelines for Management of Incidental Pulmonary Nodules Detected on CT Images: From the Fleischner Society 2017; Radiology 2017; 284:228-243. 7. Mild colonic diverticulosis without evidence to suggest an  acute diverticulitis at this time. 8. Additional incidental findings, as above.   Electronically Signed   By: Vinnie Langton M.D.   On: 09/25/2016 08:47   STS Risk Calculator  Procedure    AVR + CABG  Risk of Mortality   5.3% Morbidity or Mortality  30.0% Prolonged LOS   20.3% Short LOS    13.5% Permanent Stroke   2.1% Prolonged Vent Support  23.5% DSW Infection    0.9% Renal Failure    11.2% Reoperation    9.5%    Impression:  Patient has stage D severe symptomatic aortic stenosis. She presents with progressive symptoms of exertional shortness of breath consistent with chronic diastolic congestive heart failure, [place] Heart Association functional class III. She was recently hospitalized for an acute exacerbation of class IV symptoms which improved with aggressive diuretic therapy. The patient ultimately was discharged from the hospital on home oxygen therapy for acute on chronic hypoxic respiratory failure. I have personally reviewed the patient's most recent transthoracic echocardiogram, diagnostic cardiac catheterization, and CT angiograms. Transthoracic echocardiogram confirms the presence of severe aortic stenosis. Aortic valve is trileaflet with significant calcification, thickening, and restricted leaflet mobility involving all 3 leaflets.  One of the leaflets moves better than the other 2, but all 3 are affected.  Peak velocity across the aortic valve measures 4 m/s corresponding to mean transvalvular gradient estimated 40 mmHg. The patient also has a relatively small aortic root which further exacerbates the functional significance of the impaired leaflet mobility. Diagnostic cardiac catheterization confirmed the presence of severe aortic stenosis. There was 75% stenosis involving a large OM 3 branch of the left circumflex coronary artery but otherwise mild nonobstructive disease. The patient does not have symptoms suggestive of ongoing angina pectoris. Cardiac gated CT  angiogram of the heart confirms the presence of anatomical characteristics consistent with severe aortic stenosis in the setting of a small aortic root. The patient would likely need aortic root enlargement or root replacement to avoid the development of patient prosthesis mismatch with conventional surgical aortic valve replacement. Risks associated with conventional surgery would unquestionably be relatively high in this obese patient with multiple comorbid medical problems and somewhat limited physical mobility. Cardiac gated CT angiogram of the heart reveals anatomical characteristics suitable for transcatheter aortic valve replacement without any complicating features, and CT angiography of the aorta and iliac vessels demonstrates what appears to be adequate pelvic access for a transfemoral approach. I feel that transcatheter aortic valve replacement would be far less risky and less invasive alternative to high risk conventional surgery.   Plan:  The patient and her daughter were counseled at length regarding treatment alternatives for management of severe symptomatic aortic stenosis. Alternative approaches such as conventional aortic valve replacement, transcatheter aortic valve replacement, and palliative medical therapy were compared and contrasted at length.  The risks associated with conventional surgical aortic valve replacement were been discussed in detail, as were expectations for post-operative convalescence. Long-term prognosis with medical  therapy was discussed. This discussion was placed in the context of the patient's own specific clinical presentation and past medical history.  All of their questions been addressed.  The patient is interested in proceeding with transcatheter aortic valve replacement as an alternative to high risk conventional surgery as soon as possible.  The patient and her daughter specifically requests that the procedure be performed the week immediately prior to  Christmas, as the patient's daughter will be out of work at that time.  We tentatively plan to proceed with surgery on 10/14/2016 via percutaneous transfemoral approach.    Following the decision to proceed with transcatheter aortic valve replacement, a discussion has been held regarding what types of management strategies would be attempted intraoperatively in the event of life-threatening complications, including whether or not the patient would be considered a candidate for the use of cardiopulmonary bypass and/or conversion to open sternotomy for attempted surgical intervention.  The patient has been advised of a variety of complications that might develop including but not limited to risks of death, stroke, paravalvular leak, aortic dissection or other major vascular complications, aortic annulus rupture, device embolization, cardiac rupture or perforation, mitral regurgitation, acute myocardial infarction, arrhythmia, heart block or bradycardia requiring permanent pacemaker placement, congestive heart failure, respiratory failure, renal failure, pneumonia, infection, other late complications related to structural valve deterioration or migration, or other complications that might ultimately cause a temporary or permanent loss of functional independence or other long term morbidity.  The patient provides full informed consent for the procedure as described and all questions were answered.   I spent in excess of 90 minutes during the conduct of this office consultation and >50% of this time involved direct face-to-face encounter with the patient for counseling and/or coordination of their care.     Valentina Gu. Roxy Manns, MD 09/30/2016 4:05 PM

## 2016-09-30 NOTE — Patient Instructions (Signed)
Continue taking all current medications without change through the day before surgery.  Have nothing to eat or drink after midnight the night before surgery.  On the morning of surgery take only Synthroid, Protonix and Cardizem CD with a sip of water.

## 2016-10-02 ENCOUNTER — Other Ambulatory Visit: Payer: Self-pay | Admitting: *Deleted

## 2016-10-02 DIAGNOSIS — I350 Nonrheumatic aortic (valve) stenosis: Secondary | ICD-10-CM

## 2016-10-10 ENCOUNTER — Encounter (HOSPITAL_COMMUNITY): Payer: Self-pay

## 2016-10-10 ENCOUNTER — Encounter (HOSPITAL_COMMUNITY)
Admission: RE | Admit: 2016-10-10 | Discharge: 2016-10-10 | Disposition: A | Discharge status: Home / Self Care | Payer: Medicare Other | Source: Ambulatory Visit | Attending: Thoracic Surgery (Cardiothoracic Vascular Surgery) | Admitting: Thoracic Surgery (Cardiothoracic Vascular Surgery)

## 2016-10-10 ENCOUNTER — Ambulatory Visit (HOSPITAL_COMMUNITY)
Admission: RE | Admit: 2016-10-10 | Discharge: 2016-10-10 | Disposition: A | Discharge status: Home / Self Care | Payer: Medicare Other | Source: Ambulatory Visit | Attending: Cardiovascular Disease | Admitting: Cardiovascular Disease

## 2016-10-10 DIAGNOSIS — J90 Pleural effusion, not elsewhere classified: Secondary | ICD-10-CM | POA: Insufficient documentation

## 2016-10-10 DIAGNOSIS — Z01810 Encounter for preprocedural cardiovascular examination: Secondary | ICD-10-CM | POA: Diagnosis not present

## 2016-10-10 DIAGNOSIS — I350 Nonrheumatic aortic (valve) stenosis: Secondary | ICD-10-CM | POA: Diagnosis not present

## 2016-10-10 DIAGNOSIS — Z01812 Encounter for preprocedural laboratory examination: Secondary | ICD-10-CM | POA: Diagnosis not present

## 2016-10-10 DIAGNOSIS — J811 Chronic pulmonary edema: Secondary | ICD-10-CM | POA: Diagnosis not present

## 2016-10-10 HISTORY — DX: Heart failure, unspecified: I50.9

## 2016-10-10 LAB — BLOOD GAS, ARTERIAL
Acid-Base Excess: 4.5 mmol/L — ABNORMAL HIGH (ref 0.0–2.0)
Bicarbonate: 28.9 mmol/L — ABNORMAL HIGH (ref 20.0–28.0)
Drawn by: 421801
O2 Saturation: 97.7 %
Patient temperature: 98.6
pCO2 arterial: 46.2 mmHg (ref 32.0–48.0)
pH, Arterial: 7.413 (ref 7.350–7.450)
pO2, Arterial: 111 mmHg — ABNORMAL HIGH (ref 83.0–108.0)

## 2016-10-10 LAB — COMPREHENSIVE METABOLIC PANEL
ALT: 35 U/L (ref 14–54)
AST: 26 U/L (ref 15–41)
Albumin: 3.8 g/dL (ref 3.5–5.0)
Alkaline Phosphatase: 62 U/L (ref 38–126)
Anion gap: 10 (ref 5–15)
BUN: 24 mg/dL — ABNORMAL HIGH (ref 6–20)
CO2: 27 mmol/L (ref 22–32)
Calcium: 9.5 mg/dL (ref 8.9–10.3)
Chloride: 103 mmol/L (ref 101–111)
Creatinine, Ser: 1.22 mg/dL — ABNORMAL HIGH (ref 0.44–1.00)
GFR calc Af Amer: 48 mL/min — ABNORMAL LOW (ref >60)
GFR calc non Af Amer: 41 mL/min — ABNORMAL LOW (ref >60)
Glucose, Bld: 89 mg/dL (ref 65–99)
Potassium: 4.4 mmol/L (ref 3.5–5.1)
Sodium: 140 mmol/L (ref 135–145)
Total Bilirubin: 0.4 mg/dL (ref 0.3–1.2)
Total Protein: 7 g/dL (ref 6.5–8.1)

## 2016-10-10 LAB — URINALYSIS, ROUTINE W REFLEX MICROSCOPIC
Bacteria, UA: NONE SEEN
Bilirubin Urine: NEGATIVE
Glucose, UA: NEGATIVE mg/dL
Hgb urine dipstick: NEGATIVE
Ketones, ur: NEGATIVE mg/dL
Leukocytes, UA: NEGATIVE
Nitrite: NEGATIVE
Protein, ur: 30 mg/dL — AB
Specific Gravity, Urine: 1.02 (ref 1.005–1.030)
pH: 5 (ref 5.0–8.0)

## 2016-10-10 LAB — CBC
HCT: 27.8 % — ABNORMAL LOW (ref 36.0–46.0)
Hemoglobin: 8.9 g/dL — ABNORMAL LOW (ref 12.0–15.0)
MCH: 27.4 pg (ref 26.0–34.0)
MCHC: 32 g/dL (ref 30.0–36.0)
MCV: 85.5 fL (ref 78.0–100.0)
Platelets: 232 10*3/uL (ref 150–400)
RBC: 3.25 MIL/uL — ABNORMAL LOW (ref 3.87–5.11)
RDW: 15.8 % — ABNORMAL HIGH (ref 11.5–15.5)
WBC: 6.5 10*3/uL (ref 4.0–10.5)

## 2016-10-10 LAB — PROTIME-INR
INR: 0.95
Prothrombin Time: 12.6 seconds (ref 11.4–15.2)

## 2016-10-10 LAB — SURGICAL PCR SCREEN
MRSA, PCR: NEGATIVE
Staphylococcus aureus: POSITIVE — AB

## 2016-10-10 LAB — GLUCOSE, CAPILLARY: Glucose-Capillary: 115 mg/dL — ABNORMAL HIGH (ref 65–99)

## 2016-10-10 LAB — ABO/RH: ABO/RH(D): A POS

## 2016-10-10 LAB — APTT: aPTT: 30 seconds (ref 24–36)

## 2016-10-10 NOTE — Progress Notes (Signed)
PCR was positive for staph called and informed Patricia [name] of results states she will start using the Mupirocin tonight.

## 2016-10-10 NOTE — Progress Notes (Signed)
PCP Dr. Jeanmarie Hubert Cardiologist Dr. [name] Martinique. Patient had an echo and cath in 2017; last stress test greater than ten years ago per patient. Denies any chest pain at PAT appt. Patient is on home oxygen and was recently hospitalized in November for breathing problems. Levada Dy, [place] notified of patient while in PAT. Called back and stated that patient did not need to be seen by her at this time.  Fasting blood sugar is 100-120.

## 2016-10-10 NOTE — Pre-Procedure Instructions (Signed)
RYLEI MASELLA  10/10/2016      [facility] - [address], [address] [place] [address] Phone: [phone] Fax: [phone]  [place], [address] [place] [address] Phone: [phone] Fax: [phone]    Your procedure is scheduled on Dec 19  Report to [facility] at [id number].  Call this number if you have problems the morning of surgery:  [phone]   Remember:  Do not eat food or drink liquids after midnight.  Take these medicines the morning of surgery with A SIP OF WATER tylenol if needed, advair inhaler, albuterol inhaler, amiodarone (Pacerone), Zyrtec if needed, Diltiazem (Cardizem), Doxazosin (Cardura), Levothyroxine (synthroid),pantoprazole (Protonix)  7 days prior to surgery STOP taking any Aspirin, Aleve, Naproxen, Ibuprofen, Motrin, Advil, Goody's, BC's, all herbal medications, fish oil, and all vitamins  Bring inhalers with you on the day of surgery Stop taking aspirin, BC's, Goody's, Herbal medications, Fish Oil, Aleve, Ibuprofen, advil, Motrin, Vitamins    How to Manage Your Diabetes Before and After Surgery  Why is it important to control my blood sugar before and after surgery? . Improving blood sugar levels before and after surgery helps healing and can limit problems. . A way of improving blood sugar control is eating a healthy diet by: o  Eating less sugar and carbohydrates o  Increasing activity/exercise o  Talking with your doctor about reaching your blood sugar goals . High blood sugars (greater than 180 mg/dL) can raise your risk of infections and slow your recovery, so you will need to focus on controlling your diabetes during the weeks before surgery. . Make sure that the doctor who takes care of your diabetes knows about your planned surgery including the  date and location.  How do I manage my blood sugar before surgery? . Check your blood sugar at least 4 times a day, starting 2 days before surgery, to make sure that the level is not too high or low. o Check your blood sugar the morning of your surgery when you wake up and every 2 hours until you get to the Short Stay unit. . If your blood sugar is less than 70 mg/dL, you will need to treat for low blood sugar: o Do not take insulin. o Treat a low blood sugar (less than 70 mg/dL) with  cup of clear juice (cranberry or apple), 4 glucose tablets, OR glucose gel. o Recheck blood sugar in 15 minutes after treatment (to make sure it is greater than 70 mg/dL). If your blood sugar is not greater than 70 mg/dL on recheck, call [phone] for further instructions. . Report your blood sugar to the short stay nurse when you get to Short Stay.  . If you are admitted to the hospital after surgery: o Your blood sugar will be checked by the staff and you will probably be given insulin after surgery (instead of oral diabetes medicines) to make sure you have good blood sugar levels. o The goal for blood sugar control after surgery is 80-180 mg/dL.      WHAT DO I DO ABOUT MY DIABETES MEDICATION?   Marland Kitchen Do not take oral diabetes medicines (pills) the morning of surgery. Metformin (Glucophage), glimepiride (Amaryl)  5  Do not wear jewelry, make-up or nail polish.  Do not wear lotions, powders,  or perfumes, or deoderant.  Do not shave 48 hours prior to surgery.  Men may shave face and neck.  Do not bring valuables to the hospital.  [facility] is not responsible for any belongings or valuables.  Contacts, dentures or bridgework may not be worn into surgery.  Leave your suitcase in the car.  After surgery it may be brought to your room.  For patients admitted to the hospital, discharge time will be determined by your treatment team.  Patients discharged the day of surgery will not be allowed to drive  home.    Special instructions:   - Preparing for Surgery  Before surgery, you can play an important role.  Because skin is not sterile, your skin needs to be as free of germs as possible.  You can reduce the number of germs on you skin by washing with CHG (chlorahexidine gluconate) soap before surgery.  CHG is an antiseptic cleaner which kills germs and bonds with the skin to continue killing germs even after washing.  Please DO NOT use if you have an allergy to CHG or antibacterial soaps.  If your skin becomes reddened/irritated stop using the CHG and inform your nurse when you arrive at Short Stay.  Do not shave (including legs and underarms) for at least 48 hours prior to the first CHG shower.  You may shave your face.  Please follow these instructions carefully:   1.  Shower with CHG Soap the night before surgery and the  morning of Surgery.  2.  If you choose to wash your hair, wash your hair first as usual with your  normal shampoo.  3.  After you shampoo, rinse your hair and body thoroughly to remove the  Shampoo.  4.  Use CHG as you would any other liquid soap.  You can apply chg directly       to the skin and wash gently with scrungie or a clean washcloth.  5.  Apply the CHG Soap to your body ONLY FROM THE NECK DOWN.        Do not use on open wounds or open sores.  Avoid contact with your eyes,       ears, mouth and genitals (private parts).  Wash genitals (private parts)   with your normal soap.  6.  Wash thoroughly, paying special attention to the area where your surgery will be performed.  7.  Thoroughly rinse your body with warm water from the neck down.  8.  DO NOT shower/wash with your normal soap after using and rinsing off  the CHG Soap.  9.  Pat yourself dry with a clean towel.            10.  Wear clean pajamas.            11.  Place clean sheets on your bed the night of your first shower and do not sleep with pets.  Day of Surgery  Do not apply any  lotions/deoderants the morning of surgery.  Please wear clean clothes to the hospital/surgery center.     Please read over the following fact sheets that you were given. Pain Booklet, Coughing and Deep Breathing, Blood Transfusion Information, MRSA Information and Surgical Site Infection Prevention

## 2016-10-10 NOTE — Progress Notes (Signed)
Message left with Thurmond Butts at Dr Guy Sandifer office informed her of lab results.

## 2016-10-11 LAB — HEMOGLOBIN A1C
Hgb A1c MFr Bld: 6.8 % — ABNORMAL HIGH (ref 4.8–5.6)
Mean Plasma Glucose: 148 mg/dL

## 2016-10-13 ENCOUNTER — Other Ambulatory Visit: Payer: Self-pay | Admitting: *Deleted

## 2016-10-13 DIAGNOSIS — I350 Nonrheumatic aortic (valve) stenosis: Secondary | ICD-10-CM

## 2016-10-13 MED ORDER — VANCOMYCIN HCL 10 G IV SOLR
1500.0000 mg | INTRAVENOUS | Status: AC
  Administered 2016-10-14: 1500 mg via INTRAVENOUS
  Filled 2016-10-13 (×2): qty 1500

## 2016-10-13 MED ORDER — NITROGLYCERIN IN D5W 200-5 MCG/ML-% IV SOLN
2.0000 ug/min | INTRAVENOUS | Status: DC
  Administered 2016-10-14: 5 ug/min via INTRAVENOUS
  Filled 2016-10-13: qty 250

## 2016-10-13 MED ORDER — DEXMEDETOMIDINE HCL IN NACL 400 MCG/100ML IV SOLN
0.1000 ug/kg/h | INTRAVENOUS | Status: AC
  Administered 2016-10-14: .3 ug/kg/h via INTRAVENOUS
  Filled 2016-10-13: qty 100

## 2016-10-13 MED ORDER — SODIUM CHLORIDE 0.9 % IV SOLN: INTRAVENOUS | Status: DC

## 2016-10-13 MED ORDER — PHENYLEPHRINE HCL 10 MG/ML IJ SOLN
30.0000 ug/min | INTRAVENOUS | Status: DC
  Filled 2016-10-13: qty 2

## 2016-10-13 MED ORDER — MAGNESIUM SULFATE 50 % IJ SOLN
40.0000 meq | INTRAMUSCULAR | Status: DC
  Filled 2016-10-13: qty 10

## 2016-10-13 MED ORDER — DEXTROSE 5 % IV SOLN
1.5000 g | INTRAVENOUS | Status: AC
  Administered 2016-10-14: 1.5 g via INTRAVENOUS
  Filled 2016-10-13: qty 1.5

## 2016-10-13 MED ORDER — SODIUM CHLORIDE 0.9 % IV SOLN
INTRAVENOUS | Status: AC
  Administered 2016-10-14: 2.7 [IU]/h via INTRAVENOUS
  Filled 2016-10-13: qty 2.5

## 2016-10-13 MED ORDER — EPINEPHRINE PF 1 MG/ML IJ SOLN
0.0000 ug/min | INTRAVENOUS | Status: DC
  Filled 2016-10-13: qty 4

## 2016-10-13 MED ORDER — NOREPINEPHRINE BITARTRATE 1 MG/ML IV SOLN
0.0000 ug/min | INTRAVENOUS | Status: AC
  Administered 2016-10-14: 1 ug/min via INTRAVENOUS
  Filled 2016-10-13: qty 4

## 2016-10-13 MED ORDER — HEPARIN SODIUM (PORCINE) 1000 UNIT/ML IJ SOLN
INTRAMUSCULAR | Status: DC
  Filled 2016-10-13: qty 30

## 2016-10-13 MED ORDER — POTASSIUM CHLORIDE 2 MEQ/ML IV SOLN
80.0000 meq | INTRAVENOUS | Status: DC
  Filled 2016-10-13: qty 40

## 2016-10-13 MED ORDER — DOPAMINE-DEXTROSE 3.2-5 MG/ML-% IV SOLN
0.0000 ug/kg/min | INTRAVENOUS | Status: DC
  Filled 2016-10-13: qty 250

## 2016-10-13 NOTE — Progress Notes (Signed)
Pt. A1c noted.

## 2016-10-14 ENCOUNTER — Inpatient Hospital Stay (HOSPITAL_COMMUNITY)
Admission: RE | Admit: 2016-10-14 | Discharge: 2016-10-16 | DRG: 267 | Disposition: A | Discharge status: Home / Self Care | Payer: Medicare Other | Source: Ambulatory Visit | Attending: Thoracic Surgery (Cardiothoracic Vascular Surgery) | Admitting: Thoracic Surgery (Cardiothoracic Vascular Surgery)

## 2016-10-14 ENCOUNTER — Encounter (HOSPITAL_COMMUNITY): Payer: Self-pay | Admitting: *Deleted

## 2016-10-14 ENCOUNTER — Inpatient Hospital Stay (HOSPITAL_COMMUNITY): Payer: Medicare Other | Admitting: Certified Registered"

## 2016-10-14 ENCOUNTER — Inpatient Hospital Stay (HOSPITAL_COMMUNITY): Payer: Medicare Other

## 2016-10-14 ENCOUNTER — Encounter (HOSPITAL_COMMUNITY)
Admission: RE | Disposition: A | Discharge status: Home / Self Care | Payer: Self-pay | Source: Ambulatory Visit | Attending: Thoracic Surgery (Cardiothoracic Vascular Surgery)

## 2016-10-14 ENCOUNTER — Inpatient Hospital Stay (HOSPITAL_COMMUNITY): Payer: Medicare Other | Admitting: Emergency Medicine

## 2016-10-14 DIAGNOSIS — I350 Nonrheumatic aortic (valve) stenosis: Secondary | ICD-10-CM | POA: Diagnosis not present

## 2016-10-14 DIAGNOSIS — I5032 Chronic diastolic (congestive) heart failure: Secondary | ICD-10-CM | POA: Diagnosis not present

## 2016-10-14 DIAGNOSIS — Z833 Family history of diabetes mellitus: Secondary | ICD-10-CM | POA: Diagnosis not present

## 2016-10-14 DIAGNOSIS — D649 Anemia, unspecified: Secondary | ICD-10-CM | POA: Diagnosis present

## 2016-10-14 DIAGNOSIS — Z9981 Dependence on supplemental oxygen: Secondary | ICD-10-CM

## 2016-10-14 DIAGNOSIS — I447 Left bundle-branch block, unspecified: Secondary | ICD-10-CM | POA: Diagnosis present

## 2016-10-14 DIAGNOSIS — R911 Solitary pulmonary nodule: Secondary | ICD-10-CM | POA: Diagnosis not present

## 2016-10-14 DIAGNOSIS — E1159 Type 2 diabetes mellitus with other circulatory complications: Secondary | ICD-10-CM | POA: Diagnosis present

## 2016-10-14 DIAGNOSIS — K219 Gastro-esophageal reflux disease without esophagitis: Secondary | ICD-10-CM | POA: Diagnosis present

## 2016-10-14 DIAGNOSIS — I110 Hypertensive heart disease with heart failure: Secondary | ICD-10-CM | POA: Diagnosis present

## 2016-10-14 DIAGNOSIS — Z885 Allergy status to narcotic agent status: Secondary | ICD-10-CM

## 2016-10-14 DIAGNOSIS — Z96611 Presence of right artificial shoulder joint: Secondary | ICD-10-CM | POA: Diagnosis present

## 2016-10-14 DIAGNOSIS — Z8249 Family history of ischemic heart disease and other diseases of the circulatory system: Secondary | ICD-10-CM

## 2016-10-14 DIAGNOSIS — I700 Atherosclerosis of aorta: Secondary | ICD-10-CM | POA: Diagnosis present

## 2016-10-14 DIAGNOSIS — E785 Hyperlipidemia, unspecified: Secondary | ICD-10-CM | POA: Diagnosis present

## 2016-10-14 DIAGNOSIS — E119 Type 2 diabetes mellitus without complications: Secondary | ICD-10-CM | POA: Diagnosis present

## 2016-10-14 DIAGNOSIS — Z952 Presence of prosthetic heart valve: Secondary | ICD-10-CM

## 2016-10-14 DIAGNOSIS — J90 Pleural effusion, not elsewhere classified: Secondary | ICD-10-CM | POA: Diagnosis not present

## 2016-10-14 DIAGNOSIS — I2510 Atherosclerotic heart disease of native coronary artery without angina pectoris: Secondary | ICD-10-CM | POA: Diagnosis not present

## 2016-10-14 DIAGNOSIS — J449 Chronic obstructive pulmonary disease, unspecified: Secondary | ICD-10-CM | POA: Diagnosis not present

## 2016-10-14 DIAGNOSIS — Z954 Presence of other heart-valve replacement: Secondary | ICD-10-CM | POA: Diagnosis not present

## 2016-10-14 DIAGNOSIS — Z9049 Acquired absence of other specified parts of digestive tract: Secondary | ICD-10-CM | POA: Diagnosis not present

## 2016-10-14 DIAGNOSIS — D509 Iron deficiency anemia, unspecified: Secondary | ICD-10-CM | POA: Diagnosis present

## 2016-10-14 DIAGNOSIS — Z7982 Long term (current) use of aspirin: Secondary | ICD-10-CM

## 2016-10-14 DIAGNOSIS — E039 Hypothyroidism, unspecified: Secondary | ICD-10-CM | POA: Diagnosis present

## 2016-10-14 DIAGNOSIS — Z6839 Body mass index (BMI) 39.0-39.9, adult: Secondary | ICD-10-CM

## 2016-10-14 DIAGNOSIS — Z87891 Personal history of nicotine dependence: Secondary | ICD-10-CM

## 2016-10-14 DIAGNOSIS — I480 Paroxysmal atrial fibrillation: Secondary | ICD-10-CM | POA: Diagnosis present

## 2016-10-14 DIAGNOSIS — Z888 Allergy status to other drugs, medicaments and biological substances status: Secondary | ICD-10-CM

## 2016-10-14 DIAGNOSIS — I10 Essential (primary) hypertension: Secondary | ICD-10-CM | POA: Diagnosis present

## 2016-10-14 DIAGNOSIS — Z006 Encounter for examination for normal comparison and control in clinical research program: Secondary | ICD-10-CM

## 2016-10-14 DIAGNOSIS — D62 Acute posthemorrhagic anemia: Secondary | ICD-10-CM | POA: Diagnosis not present

## 2016-10-14 DIAGNOSIS — I272 Pulmonary hypertension, unspecified: Secondary | ICD-10-CM | POA: Diagnosis not present

## 2016-10-14 DIAGNOSIS — R0602 Shortness of breath: Secondary | ICD-10-CM | POA: Diagnosis present

## 2016-10-14 DIAGNOSIS — J811 Chronic pulmonary edema: Secondary | ICD-10-CM | POA: Diagnosis not present

## 2016-10-14 DIAGNOSIS — I5033 Acute on chronic diastolic (congestive) heart failure: Secondary | ICD-10-CM | POA: Diagnosis not present

## 2016-10-14 DIAGNOSIS — K5730 Diverticulosis of large intestine without perforation or abscess without bleeding: Secondary | ICD-10-CM | POA: Diagnosis present

## 2016-10-14 DIAGNOSIS — J9811 Atelectasis: Secondary | ICD-10-CM

## 2016-10-14 DIAGNOSIS — Z79899 Other long term (current) drug therapy: Secondary | ICD-10-CM

## 2016-10-14 DIAGNOSIS — I369 Nonrheumatic tricuspid valve disorder, unspecified: Secondary | ICD-10-CM | POA: Diagnosis not present

## 2016-10-14 HISTORY — DX: Presence of prosthetic heart valve: Z95.2

## 2016-10-14 HISTORY — PX: TRANSCATHETER AORTIC VALVE REPLACEMENT, TRANSFEMORAL: SHX6400

## 2016-10-14 HISTORY — PX: TEE WITHOUT CARDIOVERSION: SHX5443

## 2016-10-14 LAB — POCT I-STAT, CHEM 8
BUN: 34 mg/dL — ABNORMAL HIGH (ref 6–20)
BUN: 32 mg/dL — ABNORMAL HIGH (ref 6–20)
BUN: 32 mg/dL — ABNORMAL HIGH (ref 6–20)
Calcium, Ion: 1.21 mmol/L (ref 1.15–1.40)
Calcium, Ion: 1.17 mmol/L (ref 1.15–1.40)
Calcium, Ion: 1.17 mmol/L (ref 1.15–1.40)
Chloride: 96 mmol/L — ABNORMAL LOW (ref 101–111)
Chloride: 96 mmol/L — ABNORMAL LOW (ref 101–111)
Chloride: 96 mmol/L — ABNORMAL LOW (ref 101–111)
Creatinine, Ser: 1.2 mg/dL — ABNORMAL HIGH (ref 0.44–1.00)
Creatinine, Ser: 1.1 mg/dL — ABNORMAL HIGH (ref 0.44–1.00)
Creatinine, Ser: 1.3 mg/dL — ABNORMAL HIGH (ref 0.44–1.00)
Glucose, Bld: 201 mg/dL — ABNORMAL HIGH (ref 65–99)
Glucose, Bld: 194 mg/dL — ABNORMAL HIGH (ref 65–99)
Glucose, Bld: 196 mg/dL — ABNORMAL HIGH (ref 65–99)
HCT: 24 % — ABNORMAL LOW (ref 36.0–46.0)
HCT: 22 % — ABNORMAL LOW (ref 36.0–46.0)
HCT: 24 % — ABNORMAL LOW (ref 36.0–46.0)
Hemoglobin: 8.2 g/dL — ABNORMAL LOW (ref 12.0–15.0)
Hemoglobin: 7.5 g/dL — ABNORMAL LOW (ref 12.0–15.0)
Hemoglobin: 8.2 g/dL — ABNORMAL LOW (ref 12.0–15.0)
Potassium: 4 mmol/L (ref 3.5–5.1)
Potassium: 3.9 mmol/L (ref 3.5–5.1)
Potassium: 4.1 mmol/L (ref 3.5–5.1)
Sodium: 138 mmol/L (ref 135–145)
Sodium: 138 mmol/L (ref 135–145)
Sodium: 137 mmol/L (ref 135–145)
TCO2: 33 mmol/L (ref 0–100)
TCO2: 31 mmol/L (ref 0–100)
TCO2: 29 mmol/L (ref 0–100)

## 2016-10-14 LAB — CBC
HCT: 28.9 % — ABNORMAL LOW (ref 36.0–46.0)
HCT: 25.9 % — ABNORMAL LOW (ref 36.0–46.0)
Hemoglobin: 9 g/dL — ABNORMAL LOW (ref 12.0–15.0)
Hemoglobin: 8.3 g/dL — ABNORMAL LOW (ref 12.0–15.0)
MCH: 26.7 pg (ref 26.0–34.0)
MCH: 27.4 pg (ref 26.0–34.0)
MCHC: 31.1 g/dL (ref 30.0–36.0)
MCHC: 32 g/dL (ref 30.0–36.0)
MCV: 85.8 fL (ref 78.0–100.0)
MCV: 85.5 fL (ref 78.0–100.0)
Platelets: 246 10*3/uL (ref 150–400)
Platelets: 185 10*3/uL (ref 150–400)
RBC: 3.37 MIL/uL — ABNORMAL LOW (ref 3.87–5.11)
RBC: 3.03 MIL/uL — ABNORMAL LOW (ref 3.87–5.11)
RDW: 16.2 % — ABNORMAL HIGH (ref 11.5–15.5)
RDW: 16.2 % — ABNORMAL HIGH (ref 11.5–15.5)
WBC: 7 10*3/uL (ref 4.0–10.5)
WBC: 5.9 10*3/uL (ref 4.0–10.5)

## 2016-10-14 LAB — POCT I-STAT 3, ART BLOOD GAS (G3+)
Acid-Base Excess: 8 mmol/L — ABNORMAL HIGH (ref 0.0–2.0)
Acid-Base Excess: 5 mmol/L — ABNORMAL HIGH (ref 0.0–2.0)
Acid-Base Excess: 6 mmol/L — ABNORMAL HIGH (ref 0.0–2.0)
Acid-Base Excess: 5 mmol/L — ABNORMAL HIGH (ref 0.0–2.0)
Acid-Base Excess: 5 mmol/L — ABNORMAL HIGH (ref 0.0–2.0)
Bicarbonate: 34.2 mmol/L — ABNORMAL HIGH (ref 20.0–28.0)
Bicarbonate: 30.8 mmol/L — ABNORMAL HIGH (ref 20.0–28.0)
Bicarbonate: 31.6 mmol/L — ABNORMAL HIGH (ref 20.0–28.0)
Bicarbonate: 31.8 mmol/L — ABNORMAL HIGH (ref 20.0–28.0)
Bicarbonate: 31.4 mmol/L — ABNORMAL HIGH (ref 20.0–28.0)
O2 Saturation: 100 %
O2 Saturation: 99 %
O2 Saturation: 98 %
O2 Saturation: 97 %
O2 Saturation: 96 %
Patient temperature: 35.6
Patient temperature: 35.8
Patient temperature: 35.9
TCO2: 36 mmol/L (ref 0–100)
TCO2: 32 mmol/L (ref 0–100)
TCO2: 33 mmol/L (ref 0–100)
TCO2: 34 mmol/L (ref 0–100)
TCO2: 33 mmol/L (ref 0–100)
pCO2 arterial: 54.3 mmHg — ABNORMAL HIGH (ref 32.0–48.0)
pCO2 arterial: 49.7 mmHg — ABNORMAL HIGH (ref 32.0–48.0)
pCO2 arterial: 50 mmHg — ABNORMAL HIGH (ref 32.0–48.0)
pCO2 arterial: 55 mmHg — ABNORMAL HIGH (ref 32.0–48.0)
pCO2 arterial: 51.9 mmHg — ABNORMAL HIGH (ref 32.0–48.0)
pH, Arterial: 7.407 (ref 7.350–7.450)
pH, Arterial: 7.401 (ref 7.350–7.450)
pH, Arterial: 7.403 (ref 7.350–7.450)
pH, Arterial: 7.365 (ref 7.350–7.450)
pH, Arterial: 7.385 (ref 7.350–7.450)
pO2, Arterial: 317 mmHg — ABNORMAL HIGH (ref 83.0–108.0)
pO2, Arterial: 124 mmHg — ABNORMAL HIGH (ref 83.0–108.0)
pO2, Arterial: 96 mmHg (ref 83.0–108.0)
pO2, Arterial: 89 mmHg (ref 83.0–108.0)
pO2, Arterial: 79 mmHg — ABNORMAL LOW (ref 83.0–108.0)

## 2016-10-14 LAB — GLUCOSE, CAPILLARY
Glucose-Capillary: 100 mg/dL — ABNORMAL HIGH (ref 65–99)
Glucose-Capillary: 108 mg/dL — ABNORMAL HIGH (ref 65–99)
Glucose-Capillary: 116 mg/dL — ABNORMAL HIGH (ref 65–99)
Glucose-Capillary: 120 mg/dL — ABNORMAL HIGH (ref 65–99)
Glucose-Capillary: 126 mg/dL — ABNORMAL HIGH (ref 65–99)
Glucose-Capillary: 128 mg/dL — ABNORMAL HIGH (ref 65–99)
Glucose-Capillary: 141 mg/dL — ABNORMAL HIGH (ref 65–99)
Glucose-Capillary: 148 mg/dL — ABNORMAL HIGH (ref 65–99)
Glucose-Capillary: 159 mg/dL — ABNORMAL HIGH (ref 65–99)
Glucose-Capillary: 167 mg/dL — ABNORMAL HIGH (ref 65–99)
Glucose-Capillary: 184 mg/dL — ABNORMAL HIGH (ref 65–99)
Glucose-Capillary: 186 mg/dL — ABNORMAL HIGH (ref 65–99)
Glucose-Capillary: 203 mg/dL — ABNORMAL HIGH (ref 65–99)

## 2016-10-14 LAB — PREPARE RBC (CROSSMATCH)

## 2016-10-14 LAB — POCT I-STAT 4, (NA,K, GLUC, HGB,HCT)
Glucose, Bld: 191 mg/dL — ABNORMAL HIGH (ref 65–99)
HCT: 24 % — ABNORMAL LOW (ref 36.0–46.0)
Hemoglobin: 8.2 g/dL — ABNORMAL LOW (ref 12.0–15.0)
Potassium: 4.1 mmol/L (ref 3.5–5.1)
Sodium: 138 mmol/L (ref 135–145)

## 2016-10-14 LAB — PROTIME-INR
INR: 1.07
Prothrombin Time: 14 seconds (ref 11.4–15.2)

## 2016-10-14 LAB — APTT: aPTT: 31 seconds (ref 24–36)

## 2016-10-14 SURGERY — IMPLANTATION, AORTIC VALVE, TRANSCATHETER, FEMORAL APPROACH
Anesthesia: General | Site: Chest

## 2016-10-14 MED ORDER — FENTANYL CITRATE (PF) 100 MCG/2ML IJ SOLN
INTRAMUSCULAR | Status: DC | PRN
  Administered 2016-10-14: 100 ug via INTRAVENOUS
  Administered 2016-10-14: 50 ug via INTRAVENOUS
  Administered 2016-10-14: 100 ug via INTRAVENOUS

## 2016-10-14 MED ORDER — DEXMEDETOMIDINE HCL IN NACL 200 MCG/50ML IV SOLN: 0.4000 ug/kg/h | INTRAVENOUS | Status: DC

## 2016-10-14 MED ORDER — DIPHENHYDRAMINE HCL 50 MG/ML IJ SOLN
INTRAMUSCULAR | Status: AC
  Filled 2016-10-14: qty 1

## 2016-10-14 MED ORDER — METOPROLOL TARTRATE 25 MG/10 ML ORAL SUSPENSION: 12.5000 mg | Freq: Two times a day (BID) | ORAL | Status: DC

## 2016-10-14 MED ORDER — PANTOPRAZOLE SODIUM 40 MG PO TBEC: 40.0000 mg | DELAYED_RELEASE_TABLET | Freq: Every day | ORAL | Status: DC

## 2016-10-14 MED ORDER — CHLORHEXIDINE GLUCONATE 4 % EX LIQD: 60.0000 mL | Freq: Once | CUTANEOUS | Status: DC

## 2016-10-14 MED ORDER — ORAL CARE MOUTH RINSE
15.0000 mL | Freq: Two times a day (BID) | OROMUCOSAL | Status: DC
  Administered 2016-10-14 – 2016-10-15 (×2): 15 mL via OROMUCOSAL

## 2016-10-14 MED ORDER — NITROGLYCERIN IN D5W 200-5 MCG/ML-% IV SOLN
0.0000 ug/min | INTRAVENOUS | Status: DC
Start: 2016-10-14 — End: 2016-10-15

## 2016-10-14 MED ORDER — ROCURONIUM BROMIDE 50 MG/5ML IV SOSY
PREFILLED_SYRINGE | INTRAVENOUS | Status: AC
  Filled 2016-10-14: qty 5

## 2016-10-14 MED ORDER — ARTIFICIAL TEARS OP OINT
TOPICAL_OINTMENT | OPHTHALMIC | Status: AC
  Filled 2016-10-14: qty 3.5

## 2016-10-14 MED ORDER — PHENYLEPHRINE 40 MCG/ML (10ML) SYRINGE FOR IV PUSH (FOR BLOOD PRESSURE SUPPORT)
PREFILLED_SYRINGE | INTRAVENOUS | Status: AC
  Filled 2016-10-14: qty 10

## 2016-10-14 MED ORDER — CHLORHEXIDINE GLUCONATE 0.12 % MT SOLN
15.0000 mL | Freq: Once | OROMUCOSAL | Status: AC
  Administered 2016-10-14: 15 mL via OROMUCOSAL

## 2016-10-14 MED ORDER — ASPIRIN 81 MG PO TBEC: 81.0000 mg | DELAYED_RELEASE_TABLET | Freq: Every day | ORAL | Status: DC

## 2016-10-14 MED ORDER — SCOPOLAMINE 1 MG/3DAYS TD PT72
MEDICATED_PATCH | TRANSDERMAL | Status: AC
  Filled 2016-10-14: qty 1

## 2016-10-14 MED ORDER — LACTATED RINGERS IV SOLN
INTRAVENOUS | Status: DC | PRN
Start: 2016-10-14 — End: 2016-10-14
  Administered 2016-10-14: 07:00:00 via INTRAVENOUS

## 2016-10-14 MED ORDER — CHLORHEXIDINE GLUCONATE 4 % EX LIQD: 30.0000 mL | CUTANEOUS | Status: DC

## 2016-10-14 MED ORDER — TRAMADOL HCL 50 MG PO TABS: 50.0000 mg | ORAL_TABLET | ORAL | Status: DC | PRN

## 2016-10-14 MED ORDER — ROCURONIUM BROMIDE 100 MG/10ML IV SOLN
INTRAVENOUS | Status: DC | PRN
  Administered 2016-10-14 (×2): 50 mg via INTRAVENOUS

## 2016-10-14 MED ORDER — LACTATED RINGERS IV SOLN: 500.0000 mL | Freq: Once | INTRAVENOUS | Status: DC | PRN

## 2016-10-14 MED ORDER — SODIUM CHLORIDE 0.9 % IV SOLN: 10.0000 mL/h | Freq: Once | INTRAVENOUS | Status: DC

## 2016-10-14 MED ORDER — DEXAMETHASONE SODIUM PHOSPHATE 10 MG/ML IJ SOLN
INTRAMUSCULAR | Status: AC
  Filled 2016-10-14: qty 1

## 2016-10-14 MED ORDER — VANCOMYCIN HCL IN DEXTROSE 1-5 GM/200ML-% IV SOLN
1000.0000 mg | Freq: Once | INTRAVENOUS | Status: AC
  Administered 2016-10-14: 1000 mg via INTRAVENOUS
  Filled 2016-10-14: qty 200

## 2016-10-14 MED ORDER — LACTATED RINGERS IV SOLN
INTRAVENOUS | Status: DC | PRN
  Administered 2016-10-14: 07:00:00 via INTRAVENOUS

## 2016-10-14 MED ORDER — PROTAMINE SULFATE 10 MG/ML IV SOLN
INTRAVENOUS | Status: AC
  Filled 2016-10-14: qty 15

## 2016-10-14 MED ORDER — ONDANSETRON HCL 4 MG/2ML IJ SOLN
INTRAMUSCULAR | Status: DC | PRN
  Administered 2016-10-14: 4 mg via INTRAVENOUS

## 2016-10-14 MED ORDER — SODIUM CHLORIDE 0.9 % IV SOLN
1.0000 mL/kg/h | INTRAVENOUS | Status: AC
  Administered 2016-10-14: 1 mL/kg/h via INTRAVENOUS

## 2016-10-14 MED ORDER — DEXAMETHASONE SODIUM PHOSPHATE 10 MG/ML IJ SOLN
INTRAMUSCULAR | Status: DC | PRN
  Administered 2016-10-14: 5 mg via INTRAVENOUS

## 2016-10-14 MED ORDER — ALBUMIN HUMAN 5 % IV SOLN: 250.0000 mL | INTRAVENOUS | Status: DC | PRN

## 2016-10-14 MED ORDER — CHLORHEXIDINE GLUCONATE 0.12 % MT SOLN
OROMUCOSAL | Status: AC
  Administered 2016-10-14: 15 mL via OROMUCOSAL
  Filled 2016-10-14: qty 15

## 2016-10-14 MED ORDER — SODIUM CHLORIDE 0.9% FLUSH
10.0000 mL | INTRAVENOUS | Status: DC | PRN
  Administered 2016-10-16: 10 mL
  Filled 2016-10-14: qty 40

## 2016-10-14 MED ORDER — EPHEDRINE 5 MG/ML INJ
INTRAVENOUS | Status: AC
  Filled 2016-10-14: qty 10

## 2016-10-14 MED ORDER — LIDOCAINE HCL (CARDIAC) 20 MG/ML IV SOLN
INTRAVENOUS | Status: DC | PRN
  Administered 2016-10-14: 100 mg via INTRAVENOUS

## 2016-10-14 MED ORDER — CHLORHEXIDINE GLUCONATE 0.12% ORAL RINSE (MEDLINE KIT)
15.0000 mL | Freq: Two times a day (BID) | OROMUCOSAL | Status: DC
  Administered 2016-10-14: 15 mL via OROMUCOSAL

## 2016-10-14 MED ORDER — ASPIRIN EC 325 MG PO TBEC
325.0000 mg | DELAYED_RELEASE_TABLET | Freq: Every day | ORAL | Status: DC
  Administered 2016-10-15: 325 mg via ORAL
  Filled 2016-10-14: qty 1

## 2016-10-14 MED ORDER — SODIUM CHLORIDE 0.9 % IV SOLN
30.0000 meq | Freq: Once | INTRAVENOUS | Status: DC
  Filled 2016-10-14: qty 15

## 2016-10-14 MED ORDER — INSULIN REGULAR BOLUS VIA INFUSION
1.0000 [IU] | Freq: Once | INTRAVENOUS | Status: DC
  Filled 2016-10-14: qty 1

## 2016-10-14 MED ORDER — METOPROLOL TARTRATE 12.5 MG HALF TABLET
12.5000 mg | ORAL_TABLET | Freq: Two times a day (BID) | ORAL | Status: DC
  Administered 2016-10-14: 12.5 mg via ORAL
  Filled 2016-10-14: qty 1

## 2016-10-14 MED ORDER — FENTANYL CITRATE (PF) 250 MCG/5ML IJ SOLN
INTRAMUSCULAR | Status: AC
  Filled 2016-10-14: qty 5

## 2016-10-14 MED ORDER — HEPARIN SODIUM (PORCINE) 1000 UNIT/ML IJ SOLN
INTRAMUSCULAR | Status: DC | PRN
  Administered 2016-10-14: 1100 [IU] via INTRAVENOUS

## 2016-10-14 MED ORDER — PROPOFOL 10 MG/ML IV BOLUS
INTRAVENOUS | Status: DC | PRN
  Administered 2016-10-14: 30 mg via INTRAVENOUS
  Administered 2016-10-14: 100 mg via INTRAVENOUS

## 2016-10-14 MED ORDER — SCOPOLAMINE 1 MG/3DAYS TD PT72
MEDICATED_PATCH | TRANSDERMAL | Status: DC | PRN
  Administered 2016-10-14: 1 via TRANSDERMAL

## 2016-10-14 MED ORDER — VANCOMYCIN HCL IN DEXTROSE 1-5 GM/200ML-% IV SOLN
1000.0000 mg | Freq: Once | INTRAVENOUS | Status: DC
  Filled 2016-10-14: qty 200

## 2016-10-14 MED ORDER — FAMOTIDINE IN NACL 20-0.9 MG/50ML-% IV SOLN
20.0000 mg | Freq: Two times a day (BID) | INTRAVENOUS | Status: DC
  Administered 2016-10-14: 20 mg via INTRAVENOUS
  Filled 2016-10-14: qty 50

## 2016-10-14 MED ORDER — SODIUM CHLORIDE 0.9% FLUSH
10.0000 mL | Freq: Two times a day (BID) | INTRAVENOUS | Status: DC
  Administered 2016-10-14: 10 mL

## 2016-10-14 MED ORDER — ONDANSETRON HCL 4 MG/2ML IJ SOLN: 4.0000 mg | Freq: Four times a day (QID) | INTRAMUSCULAR | Status: DC | PRN

## 2016-10-14 MED ORDER — MIDAZOLAM HCL 2 MG/2ML IJ SOLN
INTRAMUSCULAR | Status: AC
  Filled 2016-10-14: qty 2

## 2016-10-14 MED ORDER — DEXTROSE 5 % IV SOLN
1.5000 g | Freq: Two times a day (BID) | INTRAVENOUS | Status: AC
  Administered 2016-10-14 – 2016-10-16 (×4): 1.5 g via INTRAVENOUS
  Filled 2016-10-14 (×5): qty 1.5

## 2016-10-14 MED ORDER — SODIUM CHLORIDE 0.9 % IV SOLN: 1.0000 mL/kg/h | INTRAVENOUS | Status: AC

## 2016-10-14 MED ORDER — ONDANSETRON HCL 4 MG/2ML IJ SOLN
INTRAMUSCULAR | Status: AC
  Filled 2016-10-14: qty 2

## 2016-10-14 MED ORDER — DIPHENHYDRAMINE HCL 50 MG/ML IJ SOLN
INTRAMUSCULAR | Status: DC | PRN
  Administered 2016-10-14: 12.5 mg via INTRAVENOUS

## 2016-10-14 MED ORDER — MORPHINE SULFATE (PF) 2 MG/ML IV SOLN: 1.0000 mg | INTRAVENOUS | Status: DC | PRN

## 2016-10-14 MED ORDER — ORAL CARE MOUTH RINSE
15.0000 mL | Freq: Four times a day (QID) | OROMUCOSAL | Status: DC
  Administered 2016-10-14: 15 mL via OROMUCOSAL

## 2016-10-14 MED ORDER — 0.9 % SODIUM CHLORIDE (POUR BTL) OPTIME
TOPICAL | Status: DC | PRN
  Administered 2016-10-14: 1000 mL

## 2016-10-14 MED ORDER — PROTAMINE SULFATE 10 MG/ML IV SOLN
INTRAVENOUS | Status: DC | PRN
  Administered 2016-10-14 (×2): 25 mg via INTRAVENOUS
  Administered 2016-10-14: 35 mg via INTRAVENOUS
  Administered 2016-10-14: 25 mg via INTRAVENOUS

## 2016-10-14 MED ORDER — LIDOCAINE 2% (20 MG/ML) 5 ML SYRINGE
INTRAMUSCULAR | Status: AC
  Filled 2016-10-14: qty 5

## 2016-10-14 MED ORDER — METOPROLOL TARTRATE 5 MG/5ML IV SOLN: 2.5000 mg | INTRAVENOUS | Status: DC | PRN

## 2016-10-14 MED ORDER — HEPARIN SODIUM (PORCINE) 1000 UNIT/ML IJ SOLN
INTRAMUSCULAR | Status: AC
  Filled 2016-10-14: qty 1

## 2016-10-14 MED ORDER — ASPIRIN 81 MG PO CHEW: 324.0000 mg | CHEWABLE_TABLET | Freq: Every day | ORAL | Status: DC

## 2016-10-14 MED ORDER — MIDAZOLAM HCL 2 MG/2ML IJ SOLN
INTRAMUSCULAR | Status: DC | PRN
  Administered 2016-10-14 (×2): 1 mg via INTRAVENOUS

## 2016-10-14 MED ORDER — SODIUM CHLORIDE 0.9 % IV SOLN
INTRAVENOUS | Status: DC
  Administered 2016-10-14: 19:00:00 via INTRAVENOUS
  Filled 2016-10-14: qty 2.5

## 2016-10-14 MED ORDER — SODIUM CHLORIDE 0.9 % IV SOLN: Freq: Once | INTRAVENOUS | Status: DC

## 2016-10-14 MED ORDER — CHLORHEXIDINE GLUCONATE 0.12 % MT SOLN
15.0000 mL | OROMUCOSAL | Status: AC
  Administered 2016-10-14: 15 mL via OROMUCOSAL

## 2016-10-14 MED ORDER — OXYCODONE HCL 5 MG PO TABS: 5.0000 mg | ORAL_TABLET | ORAL | Status: DC | PRN

## 2016-10-14 MED ORDER — PHENYLEPHRINE HCL 10 MG/ML IJ SOLN
0.0000 ug/min | INTRAVENOUS | Status: DC
  Filled 2016-10-14: qty 2

## 2016-10-14 MED FILL — Potassium Chloride Inj 2 mEq/ML: INTRAVENOUS | Qty: 40 | Status: AC

## 2016-10-14 MED FILL — Magnesium Sulfate Inj 50%: INTRAMUSCULAR | Qty: 10 | Status: AC

## 2016-10-14 MED FILL — Heparin Sodium (Porcine) Inj 1000 Unit/ML: INTRAMUSCULAR | Qty: 30 | Status: AC

## 2016-10-14 SURGICAL SUPPLY — 100 items
ADAPTER UNIV SWAN GANZ BIP (ADAPTER) ×2 IMPLANT
ADAPTER UNV SWAN GANZ BIP (ADAPTER) ×1
ADPR CATH UNV NS SG CATH (ADAPTER) ×1
ATTRACTOMAT 16X20 MAGNETIC DRP (DRAPES) IMPLANT
BAG BANDED W/RUBBER/TAPE 36X54 (MISCELLANEOUS) ×3 IMPLANT
BAG DECANTER FOR FLEXI CONT (MISCELLANEOUS) IMPLANT
BAG SNAP BAND KOVER 36X36 (MISCELLANEOUS) ×6 IMPLANT
BLADE 10 SAFETY STRL DISP (BLADE) ×3 IMPLANT
BLADE STERNUM SYSTEM 6 (BLADE) ×3 IMPLANT
BLADE SURG ROTATE 9660 (MISCELLANEOUS) IMPLANT
CABLE PACING FASLOC BIEGE (MISCELLANEOUS) ×3 IMPLANT
CABLE PACING FASLOC BLUE (MISCELLANEOUS) ×3 IMPLANT
CANISTER SUCTION 2500CC (MISCELLANEOUS) IMPLANT
CANNULA FEM VENOUS REMOTE 22FR (CANNULA) IMPLANT
CANNULA OPTISITE PERFUSION 16F (CANNULA) IMPLANT
CANNULA OPTISITE PERFUSION 18F (CANNULA) IMPLANT
CATH DIAG EXPO 6F VENT PIG 145 (CATHETERS) ×6 IMPLANT
CATH EXPO 5FR AL1 (CATHETERS) ×3 IMPLANT
CATH S G BIP PACING (SET/KITS/TRAYS/PACK) ×6 IMPLANT
CLIP TI MEDIUM 24 (CLIP) ×3 IMPLANT
CLIP TI WIDE RED SMALL 24 (CLIP) ×3 IMPLANT
CONT SPEC 4OZ CLIKSEAL STRL BL (MISCELLANEOUS) ×6 IMPLANT
COVER DOME SNAP 22 D (MISCELLANEOUS) ×3 IMPLANT
COVER MAYO STAND STRL (DRAPES) ×3 IMPLANT
COVER TABLE BACK 60X90 (DRAPES) ×3 IMPLANT
CRADLE DONUT ADULT HEAD (MISCELLANEOUS) ×3 IMPLANT
DERMABOND ADVANCED (GAUZE/BANDAGES/DRESSINGS) ×1
DERMABOND ADVANCED .7 DNX12 (GAUZE/BANDAGES/DRESSINGS) ×2 IMPLANT
DEVICE CLOSURE PERCLS PRGLD 6F (VASCULAR PRODUCTS) ×4 IMPLANT
DRAPE INCISE IOBAN 66X45 STRL (DRAPES) IMPLANT
DRAPE SLUSH MACHINE 52X66 (DRAPES) ×3 IMPLANT
DRAPE TABLE COVER HEAVY DUTY (DRAPES) ×3 IMPLANT
DRSG COVADERM 4X6 (GAUZE/BANDAGES/DRESSINGS) ×3 IMPLANT
DRSG TEGADERM 4X4.75 (GAUZE/BANDAGES/DRESSINGS) ×3 IMPLANT
ELECT REM PT RETURN 9FT ADLT (ELECTROSURGICAL) ×6
ELECTRODE REM PT RTRN 9FT ADLT (ELECTROSURGICAL) ×4 IMPLANT
FELT TEFLON 6X6 (MISCELLANEOUS) ×3 IMPLANT
FEMORAL VENOUS CANN RAP (CANNULA) IMPLANT
GAUZE SPONGE 4X4 12PLY STRL (GAUZE/BANDAGES/DRESSINGS) ×3 IMPLANT
GLOVE BIO SURGEON STRL SZ7.5 (GLOVE) ×3 IMPLANT
GLOVE BIO SURGEON STRL SZ8 (GLOVE) ×6 IMPLANT
GLOVE EUDERMIC 7 POWDERFREE (GLOVE) ×3 IMPLANT
GLOVE ORTHO TXT STRL SZ7.5 (GLOVE) ×3 IMPLANT
GOWN STRL REUS W/ TWL LRG LVL3 (GOWN DISPOSABLE) ×6 IMPLANT
GOWN STRL REUS W/ TWL XL LVL3 (GOWN DISPOSABLE) ×12 IMPLANT
GOWN STRL REUS W/TWL LRG LVL3 (GOWN DISPOSABLE) ×6
GOWN STRL REUS W/TWL XL LVL3 (GOWN DISPOSABLE) ×6
GUIDEWIRE SAF TJ AMPL .035X180 (WIRE) ×3 IMPLANT
GUIDEWIRE SAFE TJ AMPLATZ EXST (WIRE) ×3 IMPLANT
GUIDEWIRE STRAIGHT .035 260CM (WIRE) ×3 IMPLANT
INSERT FOGARTY 61MM (MISCELLANEOUS) ×3 IMPLANT
INSERT FOGARTY SM (MISCELLANEOUS) IMPLANT
INSERT FOGARTY XLG (MISCELLANEOUS) IMPLANT
KIT BASIN OR (CUSTOM PROCEDURE TRAY) ×3 IMPLANT
KIT DILATOR VASC 18G NDL (KITS) IMPLANT
KIT HEART LEFT (KITS) ×3 IMPLANT
KIT ROOM TURNOVER OR (KITS) ×3 IMPLANT
KIT SUCTION CATH 14FR (SUCTIONS) ×6 IMPLANT
NEEDLE PERC 18GX7CM (NEEDLE) ×3 IMPLANT
NS IRRIG 1000ML POUR BTL (IV SOLUTION) ×9 IMPLANT
PACK AORTA (CUSTOM PROCEDURE TRAY) ×3 IMPLANT
PAD ARMBOARD 7.5X6 YLW CONV (MISCELLANEOUS) ×6 IMPLANT
PAD ELECT DEFIB RADIOL ZOLL (MISCELLANEOUS) ×3 IMPLANT
PATCH TACHOSII LRG 9.5X4.8 (VASCULAR PRODUCTS) IMPLANT
PERCLOSE PROGLIDE 6F (VASCULAR PRODUCTS) ×6
SET MICROPUNCTURE 5F STIFF (MISCELLANEOUS) ×3 IMPLANT
SHEATH AVANTI 11CM 8FR (MISCELLANEOUS) ×3 IMPLANT
SHEATH PINNACLE 6F 10CM (SHEATH) ×6 IMPLANT
SLEEVE REPOSITIONING LENGTH 30 (MISCELLANEOUS) ×3 IMPLANT
SPONGE GAUZE 4X4 12PLY STER LF (GAUZE/BANDAGES/DRESSINGS) ×3 IMPLANT
SPONGE LAP 4X18 X RAY DECT (DISPOSABLE) ×3 IMPLANT
STOPCOCK MORSE 400PSI 3WAY (MISCELLANEOUS) ×18 IMPLANT
SUT ETHIBOND X763 2 0 SH 1 (SUTURE) IMPLANT
SUT GORETEX CV 4 TH 22 36 (SUTURE) IMPLANT
SUT GORETEX CV4 TH-18 (SUTURE) IMPLANT
SUT GORETEX TH-18 36 INCH (SUTURE) IMPLANT
SUT MNCRL AB 3-0 PS2 18 (SUTURE) IMPLANT
SUT PROLENE 3 0 SH1 36 (SUTURE) IMPLANT
SUT PROLENE 4 0 RB 1 (SUTURE)
SUT PROLENE 4-0 RB1 .5 CRCL 36 (SUTURE) IMPLANT
SUT PROLENE 5 0 C 1 36 (SUTURE) IMPLANT
SUT PROLENE 6 0 C 1 30 (SUTURE) IMPLANT
SUT SILK  1 MH (SUTURE) ×1
SUT SILK 1 MH (SUTURE) ×2 IMPLANT
SUT SILK 2 0 SH CR/8 (SUTURE) IMPLANT
SUT VIC AB 2-0 CT1 27 (SUTURE)
SUT VIC AB 2-0 CT1 TAPERPNT 27 (SUTURE) IMPLANT
SUT VIC AB 2-0 CTX 36 (SUTURE) IMPLANT
SUT VIC AB 3-0 SH 8-18 (SUTURE) IMPLANT
SYR 30ML LL (SYRINGE) ×6 IMPLANT
SYR 50ML LL SCALE MARK (SYRINGE) ×3 IMPLANT
SYRINGE 10CC LL (SYRINGE) ×9 IMPLANT
TOWEL OR 17X26 10 PK STRL BLUE (TOWEL DISPOSABLE) ×6 IMPLANT
TRANSDUCER W/STOPCOCK (MISCELLANEOUS) ×6 IMPLANT
TRAY FOLEY IC TEMP SENS 14FR (CATHETERS) ×3 IMPLANT
TUBE SUCT INTRACARD DLP 20F (MISCELLANEOUS) IMPLANT
TUBING HIGH PRESSURE 120CM (CONNECTOR) ×3 IMPLANT
VALVE HEART TRANSCATH SZ3 23MM (Prosthesis & Implant Heart) ×3 IMPLANT
WIRE AMPLATZ SS-J .035X180CM (WIRE) ×3 IMPLANT
WIRE BENTSON .035X145CM (WIRE) ×3 IMPLANT

## 2016-10-14 NOTE — Transfer of Care (Signed)
Immediate Anesthesia Transfer of Care Note  Patient: Patricia [name]  Procedure(s) Performed: Procedure(s): TRANSCATHETER AORTIC VALVE REPLACEMENT, TRANSFEMORAL (N/A) TRANSESOPHAGEAL ECHOCARDIOGRAM (TEE) (N/A)  Patient Location: ICU  Anesthesia Type:General  Level of Consciousness: Patient remains intubated per anesthesia plan  Airway & Oxygen Therapy: Patient remains intubated per anesthesia plan and Patient placed on Ventilator (see vital sign flow sheet for setting)  Post-op Assessment: Report given to RN and Post -op Vital signs reviewed and stable  Post vital signs: Reviewed and stable  Last Vitals:  Vitals:   10/14/16 1025 10/14/16 1030  BP: 101/69   Pulse: (!) 49 (!) 47  Resp: (!) 24 12  Temp:      Last Pain:  Vitals:   10/14/16 0610  TempSrc:   PainSc: 0-No pain         Complications: No apparent anesthesia complications

## 2016-10-14 NOTE — Anesthesia Preprocedure Evaluation (Addendum)
Anesthesia Evaluation  Patient identified by MRN, date of birth, ID band Patient awake    Reviewed: Allergy & Precautions, H&P , NPO status , Patient's Chart, lab work & pertinent test results, reviewed documented beta blocker date and time , Unable to perform ROS - Chart review only  History of Anesthesia Complications (+) PONV and history of anesthetic complications  Airway Mallampati: II  TM Distance: >3 FB Neck ROM: full    Dental  (+) Poor Dentition, Dental Advisory Given   Pulmonary asthma , COPD, former smoker,    Pulmonary exam normal breath sounds clear to auscultation       Cardiovascular hypertension, + CAD and +CHF  + Cardiac Defibrillator  Rhythm:regular Rate:Normal + Systolic murmurs 1. Single vessel obstructive CAD involving OM3 2. Moderate to severe aortic stenosis. Mean gradient 41 mm Hg, valve index 0.7 3. Mild pulmonary HTN 4. Elevated LV filling pressures. Prominent V waves on PCWP tracings c/w decreased LV compliance.  5. Normal cardiac output.   Plan: recommend consideration for AVR/ single vessel CABG.     Neuro/Psych negative neurological ROS     GI/Hepatic Neg liver ROS, GERD  ,  Endo/Other  diabetesHypothyroidism Morbid obesity  Renal/GU Renal InsufficiencyRenal disease     Musculoskeletal   Abdominal   Peds  Hematology   Anesthesia Other Findings   Reproductive/Obstetrics                            Anesthesia Physical  Anesthesia Plan  ASA: IV  Anesthesia Plan: General   Post-op Pain Management:    Induction: Intravenous  Airway Management Planned: Oral ETT  Additional Equipment: Arterial line and CVP  Intra-op Plan:   Post-operative Plan: Possible Post-op intubation/ventilation  Informed Consent: I have reviewed the patients History and Physical, chart, labs and discussed the procedure including the risks, benefits and alternatives for the  proposed anesthesia with the patient or authorized representative who has indicated his/her understanding and acceptance.   Dental Advisory Given and Dental advisory given  Plan Discussed with: CRNA, Surgeon and Anesthesiologist  Anesthesia Plan Comments: (  )       Anesthesia Quick Evaluation

## 2016-10-14 NOTE — H&P (View-Only) (Signed)
[facility] [place] SURGERY CONSULTATION REPORT  Referring Provider is Martinique, [name] M, MD PCP is Jeanmarie Hubert, MD  Chief Complaint  Patient presents with  . Aortic Stenosis    severe..eval for AVR/CABG vs TAVR  . Coronary Artery Disease    single vessel obstructing per CATH    HPI:  Patient is a 80 year old obese white female with history of aortic stenosis, hypertension, atrial fibrillation, coronary artery disease, chronic diastolic congestive heart failure, acute on chronic hypoxic respiratory failure, hyperlipidemia, and GI bleeding with anemia who has been referred for a second surgical consultation to discuss treatment options for management of severe symptomatic aortic stenosis and coronary artery disease.  The patient states that she has been told that she had a heart murmur for many years. She never had it formally evaluated until December 2016 when she presented with acute on chronic shortness of breath with hypoxemia. She was found to be in atrial fibrillation with rapid ventricular response and pulmonary edema consistent with class IV congestive heart failure. Echocardiogram performed at that time revealed normal left ventricular systolic function with ejection fraction estimated 55-60%. There was "moderate to severe" aortic stenosis. She was started on amiodarone and anticoagulated using Xarelto and later underwent TEE guided cardioversion. She has been maintaining sinus rhythm ever since. Xarelto was later stopped because of concerns regarding GI bleeding and chronic anemia. She underwent right shoulder replacement by Dr. Mardelle Matte on 12/18/2015. She required transfusion postoperatively but did not have problems with acute exacerbation of congestive heart failure. She later underwent EGD and colonoscopy revealing gastric and colonic polyps without signs of active bleeding. She has been followed carefully by Dr. Martinique and  underwent follow-up echocardiogram 04/30/2016. Left ventricular systolic function remained normal with ejection fraction estimated 55-60%. Peak velocity across the aortic valve was reported 4.0 m/s corresponding to mean transvalvular gradient estimated 40 mmHg. The DVI was reported 0.31.  She was treated medically but continued to complain of progressive symptoms of exertional shortness of breath and fatigue, despite the fact that she has maintained sinus rhythm. Diagnostic cardiac catheterization was performed 09/02/2016 confirming the presence of severe aortic stenosis with mean transvalvular gradient measured 41 mmHg at catheterization. There was moderate pulmonary hypertension and moderate coronary artery disease with 75% stenosis of the large third obtuse marginal branch of the left circumflex but otherwise mild nonobstructive disease. The patient was referred for surgical consultation and has been evaluated previously by Dr. Cyndia Bent. The patient was felt to be moderate to high risk for conventional surgical aortic valve replacement and transcatheter aortic valve replacement has been discussed as an alternative. The patient has subsequently undergone CT angiography and has been referred for second surgical opinion. Of note, approximately 2 weeks ago the patient was hospitalized briefly for acute exacerbation of chronic diastolic congestive heart failure with hypoxemia. She was diuresed aggressively in the hospital and her home Lasix dose was increased.  She was discharged from the hospital with home oxygen therapy.  The patient is divorced and lives alone in [place] where she remains functionally independent. 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 She has 1 grown daughter who accompanies her to the office for her consultation today. The patient has remained functionally independent all of her adult life and throughout retirement. She admits  to decreased physical activity over the last couple of years primarily because of progressive exertional shortness of breath. The patient  also has problems with chronic pain in her lower back and knees related to degenerative arthritis. She describes a long history of progressive exertional shortness of breath that has gotten considerably worse over the past year. She has been stable since her most recent hospital discharge 2 weeks ago although she still gets short of breath with moderate low level activity. Oxygen therapy seems to help somewhat. She denies any history of PND, orthopnea, palpitations, or syncope. She reports occasional dizzy spells. She has had some lower extremity edema that has improved with increased Lasix. She denies any history of exertional chest pain or chest tightness. Her mobility is also somewhat limited because of arthritis pain in her back and knees. She reports some mild difficulty with balance but she does not use a cane or a walker.   Past Medical History:  Diagnosis Date  . Allergy   . Anemia, unspecified   . Arthritis   . Asthma   . Atrial fibrillation status post cardioversion [facility]) 09/2015   s/p TEE/DCCV>>SR on amio  . Benign neoplasm of colon   . Carpal tunnel syndrome   . Cellulitis and abscess of finger, unspecified   . Cerumen impaction   . Cervicalgia   . Chronic airway obstruction, not elsewhere classified   . Chronic anticoagulation 09/2015   Xarelto for afib, CHADS2VASC=5  . Diarrhea   . Diffuse cystic mastopathy   . Dysrhythmia   . Edema   . Encounter for long-term (current) use of other medications   . GERD (gastroesophageal reflux disease)   . Heart murmur   . Lumbago   . Neck pain 05/23/2015  . Obesity, unspecified   . Other and unspecified hyperlipidemia   . Other psoriasis   . Pain in joint, lower leg   . PONV (postoperative nausea and vomiting)   . Primary localized osteoarthrosis of right shoulder 12/18/2015  . Routine gynecological  examination   . Scoliosis (and kyphoscoliosis), idiopathic   . Severe aortic stenosis   . Shortness of breath dyspnea    with exertion  . Type II or unspecified type diabetes mellitus without mention of complication, uncontrolled   . Unspecified essential hypertension   . Unspecified hemorrhoids without mention of complication   . Unspecified hypothyroidism   . Urge incontinence     Past Surgical History:  Procedure Laterality Date  . ABDOMINAL HYSTERECTOMY  1987   complete  . BREAST SURGERY     right breast 3 surgeries [phone]  . CARDIAC CATHETERIZATION N/A 09/02/2016   Procedure: Right/Left Heart Cath and Coronary Angiography;  Surgeon: [name] M Martinique, MD;  Location: [facility];  Service: Cardiovascular;  Laterality: N/A;  . CARDIOVERSION N/A 10/19/2015   Procedure: CARDIOVERSION;  Surgeon: Lelon Perla, MD;  Location: [facility];  Service: Cardiovascular;  Laterality: N/A;  . [place]  . COLON SURGERY     2004,2007,2013.  3 times colon surgieres  . EXCISE LE MANDIBULAR LYMPH NODE T     DR C. NEWMAN  . FOOT SURGERY  1989  . LEFT SHOULDER ARTHROSCOPY    . 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 NEUROPLASTY / TRANSPOSITION MEDIAN NERVE AT CARPAL TUNNEL BILATERAL  05/2003  . TEE WITHOUT CARDIOVERSION N/A 10/19/2015   Procedure: Transesophageal Echocardiogram (TEE) ;  Surgeon: Lelon Perla, MD;  Location: [facility]OS[facility];  Service: Cardiovascular;  Laterality: N/A;  . TOTAL SHOULDER ARTHROPLASTY Right 12/18/2015   Procedure: TOTAL SHOULDER ARTHROPLASTY;  Surgeon: [name], MD;  Location: [facility];  Service: Orthopedics;  Laterality: Right;  . TRIGGER FINGER RELEASE Left   . VAGINAL CYST REMOVED  1967    Family History  Problem Relation Age of Onset  . Diabetes Father   . Stroke Father   . Heart disease Father   . Liver cancer Sister   . Heart disease Brother   . Asthma Sister   . Arthritis Sister     Social History   Social  History  . Marital status: Divorced    Spouse name: N/A  . Number of children: 1  . Years of education: N/A   Occupational History  . Retired Market researcher for [employer] History Main Topics  . Smoking status: Former Smoker    Packs/day: 1.00    Years: 40.00    Quit date: 09/07/1989  . Smokeless tobacco: Never Used  . Alcohol use No  . Drug use: No  . Sexual activity: No   Other Topics Concern  . Not on file   Social History Narrative   Her daughter & 3 grandchildren live in the area.      Current Outpatient Prescriptions  Medication Sig Dispense Refill  . acetaminophen (TYLENOL) 500 MG tablet Take 500 mg by mouth every 6 (six) hours as needed for mild pain.    Marland Kitchen ADVAIR DISKUS 100-50 MCG/DOSE AEPB Inhale 1 inhalation two  times daily for breathing 180 each 3  . albuterol (PROVENTIL HFA;VENTOLIN HFA) 108 (90 Base) MCG/ACT inhaler Inhale 2 puffs into the lungs every 6 (six) hours as needed for wheezing or shortness of breath.    Marland Kitchen amiodarone (PACERONE) 200 MG tablet Take one tablet daily by mouth 90 tablet 3  . aspirin EC 81 MG EC tablet Take 1 tablet (81 mg total) by mouth daily. 30 tablet 0  . cetirizine (ZYRTEC) 10 MG tablet Take 10 mg by mouth daily as needed for allergies.     . chlorhexidine (PERIDEX) 0.12 % solution Rinse with 15 mls twice daily for 30 seconds. Use after breakfast and at bedtime. Spit out excess. Do not swallow. 480 mL prn  . cholecalciferol (VITAMIN D) 1000 units tablet One daily for vitamin D supplement 100 tablet 4  . COMFORT LANCETS MISC Use daily to get blood for glucometer 100 each 3  . diltiazem (CARDIZEM CD) 180 MG 24 hr capsule Take 1 capsule by mouth  once a day to control blood pressure 90 capsule 3  . doxazosin (CARDURA) 4 MG tablet TAKE 1 TABLET BY MOUTH ONCE DAILY TO HELP CONTROL BLOOD PRESSURE 90 tablet 0  . ferrous sulfate 325 (65 FE) MG EC tablet Take 1 tablet (325 mg total) by mouth daily with breakfast. 30 tablet 6  .  furosemide (LASIX) 40 MG tablet Take 1 tablet (40 mg total) by mouth daily. 30 tablet 0  . glimepiride (AMARYL) 2 MG tablet Take 1 tablet by mouth at  breakfast to control  Glucose 90 tablet 0  . glucose blood (BAYER CONTOUR TEST) test strip Use daily too check glucose 100 each 4  . levothyroxine (SYNTHROID, LEVOTHROID) 50 MCG tablet Take 1 tablet by mouth  daily for thyroid  supplement 90 tablet 1  . metFORMIN (GLUCOPHAGE) 500 MG tablet Take 500 mg by mouth 2 (two) times daily with a meal.    . pantoprazole (PROTONIX) 40 MG tablet Take 1 tablet (40 mg total) by mouth daily. 90 tablet 1  . potassium gluconate 595 (99 K) MG TABS  tablet Take 595 mg by mouth every other day. TAKES ONLY WHEN USING FUROSEMIDE    . pravastatin (PRAVACHOL) 20 MG tablet TAKE 1 TABLET BY MOUTH  DAILY 90 tablet 1  . triamcinolone (KENALOG) 0.025 % cream Apply 1 application topically daily as needed (psoriasis).      No current facility-administered medications for this visit.     Allergies  Allergen Reactions  . Codeine Other (See Comments)    "spaces her out, dizziness, can't walk well"  . Vesicare [Solifenacin] Itching      Review of Systems:   General:  normal appetite, normal energy, no weight gain, no weight loss, no fever  Cardiac:  no chest pain with exertion, no chest pain at rest, + SOB with exertion, no resting SOB, no PND, no orthopnea, no palpitations, no arrhythmia, + atrial fibrillation, + LE edema, + dizzy spells, no syncope  Respiratory:  + shortness of breath, + home oxygen, no productive cough, no dry cough, no bronchitis, + wheezing, no hemoptysis, + asthma, no pain with inspiration or cough, no sleep apnea, no CPAP at night  GI:   no difficulty swallowing, no reflux, no frequent heartburn, no hiatal hernia, no abdominal pain, no constipation, no diarrhea, no hematochezia, no hematemesis, no melena  GU:   no dysuria,  no frequency, no urinary tract infection, no hematuria, no kidney stones, no  kidney disease  Vascular:  no pain suggestive of claudication, no pain in feet, no leg cramps, no varicose veins, no DVT, no non-healing foot ulcer  Neuro:   no stroke, no TIA's, no seizures, no headaches, no temporary blindness one eye,  no slurred speech, no peripheral neuropathy, no chronic pain, mild instability of gait, no memory/cognitive dysfunction  Musculoskeletal: + arthritis, + joint swelling, no myalgias, mild difficulty walking, somewhat decreased mobility   Skin:   no rash, no itching, no skin infections, no pressure sores or ulcerations  Psych:   no anxiety, no depression, no nervousness, no unusual recent stress  Eyes:   no blurry vision, no floaters, no recent vision changes, + wears glasses or contacts  ENT:   no hearing loss, no loose or painful teeth, + dentures  Hematologic:  + easy bruising, no abnormal bleeding, no clotting disorder, no frequent epistaxis  Endocrine:  + diabetes, does check CBG's at home           Physical Exam:   BP (!) 109/46 (BP Location: Left Arm, Patient Position: Sitting, Cuff Size: Large)   Resp 16   Ht 5' 3" (1.6 m)   Wt 222 lb (100.7 kg)   SpO2 93% Comment: ON 2L O2  BMI 39.33 kg/m   General:  Moderately obese,  well-appearing  HEENT:  Unremarkable   Neck:   no JVD, no bruits, no adenopathy   Chest:   clear to auscultation, symmetrical breath sounds, no wheezes, no rhonchi   CV:   RRR, grade IV/VI crescendo/decrescendo murmur heard best at RSB,  no diastolic murmur  Abdomen:  soft, non-tender, no masses   Extremities:  warm, well-perfused, pulses diminished, trace LE edema  Rectal/GU  Deferred  Neuro:   Grossly non-focal and symmetrical throughout  Skin:   Clean and dry, no rashes, no breakdown   Diagnostic Tests:  Transthoracic Echocardiography  Patient:    Patricia [name], Patricia [name] MR #:       [id number] Study Date: 04/30/2016 Gender:     F Age:        76 Height:  160 cm Weight:     96.6 kg BSA:        2.12 m^2 Pt.  Status: Room:   ORDERING     [name] Martinique, M.D.  REFERRING    [name] Martinique, M.D.  ATTENDING    Sanda Klein, MD  SONOGRAPHER  Marygrace Drought, RCS  PERFORMING   [facility], Outpatient  cc:  ------------------------------------------------------------------- LV EF: 55% -   60%  ------------------------------------------------------------------- Indications:      Aortic Stenosis (I35.0).  ------------------------------------------------------------------- History:   PMH:   Atrial fibrillation.  Risk factors: Hypertension. Diabetes mellitus.  ------------------------------------------------------------------- Study Conclusions  - Left ventricle: The cavity size was normal. There was moderate   concentric hypertrophy. Systolic function was normal. The   estimated ejection fraction was in the range of 55% to 60%. Wall   motion was normal; there were no regional wall motion   abnormalities. Features are consistent with a pseudonormal left   ventricular filling pattern, with concomitant abnormal relaxation   and increased filling pressure (grade 2 diastolic dysfunction). - Aortic valve: There was moderate to severe stenosis. Mean   gradient (S): 40 mm Hg. Peak gradient (S): 65 mm Hg. Valve area   (VTI): 1.07 cm^2. Valve area (Vmax): 1 cm^2. Valve area (Vmean):   0.94 cm^2. - Mitral valve: Calcified annulus. Mildly thickened leaflets .   There was mild regurgitation. Valve area by continuity equation   (using LVOT flow): 1.88 cm^2. - Left atrium: The atrium was moderately to severely dilated. - Pulmonary arteries: Systolic pressure was mildly increased. PA   peak pressure: 39 mm Hg (S).  Transthoracic echocardiography.  M-mode, complete 2D, spectral Doppler, and color Doppler.  Birthdate:  Patient birthdate: Mar 18, 1936.  Age:  Patient is 80 yr old.  Sex:  Gender: female. BMI: 37.7 kg/m^2.  Blood pressure:     196/79  Patient status: Outpatient.  Study date:  Study date:  04/30/2016. Study time: 10:05 AM.  Location:  [facility]  -------------------------------------------------------------------  ------------------------------------------------------------------- Left ventricle:  The cavity size was normal. There was moderate concentric hypertrophy. Systolic function was normal. The estimated ejection fraction was in the range of 55% to 60%. Wall motion was normal; there were no regional wall motion abnormalities. Features are consistent with a pseudonormal left ventricular filling pattern, with concomitant abnormal relaxation and increased filling pressure (grade 2 diastolic dysfunction).  ------------------------------------------------------------------- Aortic valve:   Moderately thickened, moderately calcified leaflets.  Doppler:   There was moderate to severe stenosis. VTI ratio of LVOT to aortic valve: 0.33. Valve area (VTI): 1.07 cm^2. Indexed valve area (VTI): 0.5 cm^2/m^2. Peak velocity ratio of LVOT to aortic valve: 0.31. Valve area (Vmax): 1 cm^2. Indexed valve area (Vmax): 0.47 cm^2/m^2. Mean velocity ratio of LVOT to aortic valve: 0.29. Valve area (Vmean): 0.94 cm^2. Indexed valve area (Vmean): 0.44 cm^2/m^2.    Mean gradient (S): 40 mm Hg. Peak gradient (S): 65 mm Hg.  ------------------------------------------------------------------- Aorta:  Aortic root: The aortic root was normal in size. Ascending aorta: The ascending aorta was normal in size.  ------------------------------------------------------------------- Mitral valve:   Calcified annulus. Mildly thickened leaflets . Leaflet separation was normal.  Doppler:  Transvalvular velocity was within the normal range. There was no evidence for stenosis. There was mild regurgitation.    Valve area by pressure half-time: 3.28 cm^2. Indexed valve area by pressure half-time: 1.55 cm^2/m^2. Valve area by continuity equation (using LVOT flow): 1.88 cm^2. Indexed valve area by  continuity equation (using LVOT flow): 0.89 cm^2/m^2.  Mean gradient (D): 4 mm Hg. Peak gradient (D): 8 mm Hg.  ------------------------------------------------------------------- Left atrium:  The atrium was moderately to severely dilated.   ------------------------------------------------------------------- Right ventricle:  The cavity size was normal. Systolic function was normal.  ------------------------------------------------------------------- Pulmonic valve:   Poorly visualized.  The valve appears to be grossly normal.  ------------------------------------------------------------------- Pulmonary artery:   Systolic pressure was mildly increased.  ------------------------------------------------------------------- Right atrium:  The atrium was normal in size.  ------------------------------------------------------------------- Pericardium:  There was no pericardial effusion.  ------------------------------------------------------------------- Systemic veins: Inferior vena cava: The vessel was normal in size. The respirophasic diameter changes were in the normal range (= 50%), consistent with normal central venous pressure. Diameter: 20 mm.  ------------------------------------------------------------------- Measurements   IVC                                      Value           Reference  ID                                       20     mm       ---------    Left ventricle                           Value           Reference  LV ID, ED, PLAX chordal                  47.8   mm       43 - 52  LV ID, ES, PLAX chordal                  30.2   mm       23 - 38  LV fx shortening, PLAX chordal           37     %        >=29  LV PW thickness, ED                      15.8   mm       ---------  IVS/LV PW ratio, ED                      1.07            <=1.3  Stroke volume, 2D                        77     ml       ---------  Stroke volume/bsa, 2D                    36      ml/m^2   ---------  LV ejection fraction, 1-p A4C            54     %        ---------  LV e&', lateral                           5.48   cm/s     ---------  LV E/e&', lateral  25.91           ---------  LV e&', medial                            7.35   cm/s     ---------  LV E/e&', medial                          19.32           ---------  LV e&', average                           6.42   cm/s     ---------  LV E/e&', average                         22.14           ---------    Ventricular septum                       Value           Reference  IVS thickness, ED                        16.9   mm       ---------    LVOT                                     Value           Reference  LVOT ID, S                               20.3   mm       ---------  LVOT area                                3.24   cm^2     ---------  LVOT peak velocity, S                    119.94 cm/s     ---------  LVOT mean velocity, S                    87.2   cm/s     ---------  LVOT VTI, S                              34.2   cm       ---------  LVOT peak gradient, S                    6      mm Hg    ---------  Stroke volume (SV), LVOT DP              110.7  ml       ---------  Stroke index (SV/bsa), LVOT DP           52.2   ml/m^2   ---------    Aortic valve  Value           Reference  Aortic valve peak velocity, S            404    cm/s     ---------  Aortic valve mean velocity, S            301    cm/s     ---------  Aortic valve VTI, S                      106    cm       ---------  Aortic mean gradient, S                  40     mm Hg    ---------  Aortic peak gradient, S                  65     mm Hg    ---------  VTI ratio, LVOT/AV                       0.33            ---------  Aortic valve area, VTI                   1.07   cm^2     ---------  Aortic valve area/bsa, VTI               0.5    cm^2/m^2 ---------  Velocity ratio, peak, LVOT/AV            0.31             ---------  Aortic valve area, peak velocity         1      cm^2     ---------  Aortic valve area/bsa, peak              0.47   cm^2/m^2 ---------  velocity  Velocity ratio, mean, LVOT/AV            0.29            ---------  Aortic valve area, mean velocity         0.94   cm^2     ---------  Aortic valve area/bsa, mean              0.44   cm^2/m^2 ---------  velocity  Aortic regurg pressure half-time         147    ms       ---------    Aorta                                    Value           Reference  Aortic root ID, ED                       24     mm       ---------    Left atrium                              Value           Reference  LA ID, A-P, ES  49     mm       ---------  LA ID/bsa, A-P                   (H)     2.31   cm/m^2   <=2.2  LA volume, S                             96     ml       ---------  LA volume/bsa, S                         45.3   ml/m^2   ---------  LA volume, ES, 1-p A4C                   75     ml       ---------  LA volume/bsa, ES, 1-p A4C               35.4   ml/m^2   ---------  LA volume, ES, 1-p A2C                   113    ml       ---------  LA volume/bsa, ES, 1-p A2C               53.3   ml/m^2   ---------    Mitral valve                             Value           Reference  Mitral E-wave peak velocity              142    cm/s     ---------  Mitral A-wave peak velocity              117    cm/s     ---------  Mitral mean velocity, D                  91.4   cm/s     ---------  Mitral deceleration time                 225    ms       150 - 230  Mitral pressure half-time                49     ms       ---------  Mitral mean gradient, D                  4      mm Hg    ---------  Mitral peak gradient, D                  8      mm Hg    ---------  Mitral E/A ratio, peak                   1.2             ---------  Mitral valve area, PHT, DP               3.28   cm^2     ---------  Mitral valve area/bsa, PHT, DP  1.55   cm^2/m^2 ---------  Mitral valve area, LVOT                  1.88   cm^2     ---------  continuity  Mitral valve area/bsa, LVOT              0.89   cm^2/m^2 ---------  continuity  Mitral annulus VTI, D                    40.9   cm       ---------    Pulmonary arteries                       Value           Reference  PA pressure, S, DP               (H)     39     mm Hg    <=30    Tricuspid valve                          Value           Reference  Tricuspid regurg peak velocity           302    cm/s     ---------  Tricuspid peak RV-RA gradient            36     mm Hg    ---------  Tricuspid maximal regurg                 302    cm/s     ---------  velocity, PISA    Systemic veins                           Value           Reference  Estimated CVP                            3      mm Hg    ---------    Right ventricle                          Value           Reference  RV pressure, S, DP               (H)     39     mm Hg    <=30  Legend: (L)  and  (H)  mark values outside specified reference range.  ------------------------------------------------------------------- Prepared and Electronically Authenticated by  Sanda Klein, MD 2017-07-05T13:43:25   Right/Left Heart Cath and Coronary Angiography  Conclusion     LV end diastolic pressure is moderately elevated.  There is severe aortic valve stenosis.  There is no mitral valve stenosis.  Prox LAD to Mid LAD lesion, 20 %stenosed.  Ost 3rd Mrg to 3rd Mrg lesion, 75 %stenosed.  Ost 2nd Mrg to 2nd Mrg lesion, 30 %stenosed.  Ost RCA to Dist RCA lesion, 10 %stenosed.  Hemodynamic findings consistent with mild pulmonary hypertension.   1. Single vessel obstructive CAD involving OM3 2. Moderate to severe aortic stenosis. Mean gradient 41 mm Hg, valve index 0.7 3. Mild pulmonary HTN 4. Elevated LV filling pressures.  Prominent V waves on PCWP tracings c/w decreased LV compliance.  5. Normal cardiac output.    Plan: recommend consideration for AVR/ single vessel CABG.    Indications   Nonrheumatic aortic valve stenosis [I35.0 (ICD-10-CM)]  Procedural Details/Technique   Technical Details Indication: 80 yo WF with mod-severe aortic stenosis, progressive. Now with symptoms of CHF.  Procedural Details: The right groin was prepped, draped, and anesthetized with 1% lidocaine. Using the modified Seldinger technique a 5 Fr sheath was placed in the right femoral artery and a 7French sheath was placed in the right femoral vein. A Swan-Ganz catheter was used for the right heart catheterization. Standard protocol was followed for recording of right heart pressures and sampling of oxygen saturations. Fick cardiac output was calculated. Both right and left pulmonary wedge pressures were recorded and were equal. Standard Judkins catheters were used for selective coronary angiography and left ventricular pressures. There were no immediate procedural complications. The patient was transferred to the post catheterization recovery area for further monitoring.  Contrast: 50 cc   Estimated blood loss <50 mL.  During this procedure the patient was administered the following to achieve and maintain moderate conscious sedation: Versed 2 mg, Fentanyl 25 mcg, while the patient's heart rate, blood pressure, and oxygen saturation were continuously monitored. The period of conscious sedation was 43 minutes, of which I was present face-to-face 100% of this time.    Complications   Complications documented before study signed (09/02/2016 10:47 AM EST)    No complications were associated with this study.  Documented by [name] M Martinique, MD - 09/02/2016 10:47 AM EST    Coronary Findings   Dominance: Right  Left Anterior Descending  Prox LAD to Mid LAD lesion, 20% stenosed. The lesion is moderately calcified.  First Diagonal Branch  Vessel is large in size.  Second Diagonal Branch  Vessel is small in size.  Left  Circumflex  Second Obtuse Marginal Branch  Ost 2nd Mrg to 2nd Mrg lesion, 30% stenosed.  Third Obtuse Marginal Branch  Ost 3rd Mrg to 3rd Mrg lesion, 75% stenosed.  Right Coronary Artery  Ost RCA to Dist RCA lesion, 10% stenosed.  Right Heart   Right Heart Pressures Hemodynamic findings consistent with mild pulmonary hypertension. Elevated LV EDP consistent with volume overload. Prominent V waves    Left Heart   Left Ventricle LV end diastolic pressure is moderately elevated.    Mitral Valve There is no mitral valve stenosis.    Aortic Valve There is severe aortic valve stenosis. The aortic valve is calcified.    Coronary Diagrams   Diagnostic Diagram     Implants     No implant documentation for this case.  PACS Images   Show images for Cardiac catheterization   Link to Procedure Log   Procedure Log    Hemo Data   Flowsheet Row Most Recent Value  Fick Cardiac Output 7.76 L/min  Fick Cardiac Output Index 3.92 (L/min)/BSA  Aortic Mean Gradient 41.4 mmHg  Aortic Peak Gradient 45 mmHg  Aortic Valve Area 1.46  Aortic Value Area Index 0.74 cm2/BSA  RA A Wave 18 mmHg  RA V Wave 14 mmHg  RA Mean 14 mmHg  RV Systolic Pressure 65 mmHg  RV Diastolic Pressure 11 mmHg  RV EDP 22 mmHg  PA Systolic Pressure 52 mmHg  PA Diastolic Pressure 23 mmHg  PA Mean 37 mmHg  PW A Wave 26 mmHg  PW V Wave 40 mmHg  PW Mean 26 mmHg  AO Systolic Pressure 099 mmHg  AO Diastolic Pressure 64 mmHg  AO Mean 833 mmHg  LV Systolic Pressure 825 mmHg  LV Diastolic Pressure 15 mmHg  LV EDP 27 mmHg  Arterial Occlusion Pressure Extended Systolic Pressure 053 mmHg  Arterial Occlusion Pressure Extended Diastolic Pressure 66 mmHg  Arterial Occlusion Pressure Extended Mean Pressure 110 mmHg  Left Ventricular Apex Extended Systolic Pressure 976 mmHg  Left Ventricular Apex Extended Diastolic Pressure 19 mmHg  Left Ventricular Apex Extended EDP Pressure 33 mmHg  QP/QS 1  TPVR Index 9.45 HRUI   TSVR Index 27.06 HRUI  PVR SVR Ratio 0.12  TPVR/TSVR Ratio 0.35    Cardiac TAVR CT  TECHNIQUE: The patient was scanned on a Philips 256 scanner. A 120 kV retrospective scan was triggered in the descending thoracic aorta at 111 HU's. Gantry rotation speed was 270 msecs and collimation was .9 mm. 10 mg of iv metoprolol and no nitro were given. The 3D data set was reconstructed in 5% intervals of the R-R cycle. Systolic and diastolic phases were analyzed on a dedicated work station using MPR, MIP and VRT modes. The patient received 80 cc of contrast.  FINDINGS: Aortic Valve: Trileaflet, moderately thickened, moderately calcified aortic valve with severely restricted leaflet opening. There are only mild calcifications extending into the LVOT.  Aorta: Normal caliber. Mild diffuse calcifications in the sinotubular junction, aortic arch and descending aorta. No dissection.  Sinotubular Junction:  25 x 23 mm  Ascending Thoracic Aorta:  27 x 26 mm  Aortic Arch:  21 x 21 mm  Descending Thoracic Aorta:  24 x 24 mm  Sinus of Valsalva Measurements:  Non-coronary:  30 mm  Right -coronary:  26 mm  Left -coronary:  28 mm  Coronary Artery Height above Annulus:  Left Main:  9.5 mm  Right Coronary:  15 mm  Virtual Basal Annulus Measurements:  Maximum/Minimum Diameter:  26 x 18 mm  Perimeter:  86 mm  Area:  356 mm2  Coronary Arteries: Normal origin, the study is insufficient for the coronary evaluation.  Optimum Fluoroscopic Angle for Delivery:  RAO 3 CRA 3  Normal pulmonary vein drainage into the left atrium.  No thrombus in the large left atrial appendage.  IMPRESSION: 1. Trileaflet, moderately thickened, moderately calcified aortic valve with severely restricted leaflet opening and only mild calcifications extending into the LVOT. The annular measurements suitable for delivery of a 23 mm Edwards-SAPIEN TAVR 3 valve.  2.  Sufficient  annular to coronary distance.  3. Optimum Fluoroscopic Angle for Delivery:  RAO 3 CRA 3  Ena Dawley   Electronically Signed   By: Ena Dawley   On: 09/12/2016 17:17        CT ANGIOGRAPHY CHEST, ABDOMEN AND PELVIS  TECHNIQUE: Multidetector CT imaging through the chest, abdomen and pelvis was performed using the standard protocol during bolus administration of intravenous contrast. Multiplanar reconstructed images and MIPs were obtained and reviewed to evaluate the vascular anatomy.  CONTRAST:  100 mL of Isovue 370.  COMPARISON:  Cardiac CTA 09/12/2016.  FINDINGS: CTA CHEST FINDINGS  Cardiovascular: Heart size is enlarged with moderate left atrial dilatation. There is no significant pericardial fluid, thickening or pericardial calcification. There is aortic atherosclerosis, as well as atherosclerosis of the great vessels of the mediastinum and the coronary arteries, including calcified atherosclerotic plaque in the left main, left anterior descending, left circumflex and right coronary arteries. Thickening calcification of the aortic valve. Calcification of the mitral annulus, particularly inferiorly. Bovine type thoracic  aortic arch (normal anatomical variant) incidentally noted.  Mediastinum/Lymph Nodes: No pathologically enlarged mediastinal or hilar lymph nodes. Esophagus is unremarkable in appearance. No axillary lymphadenopathy.  Lungs/Pleura: 5 mm pulmonary nodule in the inferior segment of the lingula (image 80 of series 7). No other larger more suspicious appearing pulmonary nodules or masses are noted. Scattered areas of subsegmental atelectasis and/or scarring are noted throughout the periphery of the lungs bilaterally. No acute consolidative airspace disease. No pleural effusions.  Musculoskeletal/Soft Tissues: There are no aggressive appearing lytic or blastic lesions noted in the visualized portions of the skeleton. Status post  right shoulder arthroplasty.  CTA ABDOMEN AND PELVIS FINDINGS  Hepatobiliary: 1.6 cm well-defined low-attenuation lesion in the periphery of segment 8 of the liver is compatible with a simple cysts. No other suspicious hepatic lesions. No intra or extrahepatic biliary ductal dilatation. Status post cholecystectomy.  Pancreas: No pancreatic mass. No pancreatic ductal dilatation. No pancreatic or peripancreatic fluid or inflammatory changes.  Spleen: Unremarkable.  Adrenals/Urinary Tract: Bilateral adrenal glands and bilateral kidneys are normal in appearance. No hydroureteronephrosis. Urinary bladder is normal in appearance.  Stomach/Bowel: Normal appearance of the stomach. There is no pathologic dilatation of small bowel or colon. A few scattered colonic diverticulae are noted, without surrounding inflammatory changes to suggest an acute diverticulitis at this time. Normal appendix.  Vascular/Lymphatic: Aortic atherosclerosis with vascular findings and measurements pertinent to potential TAVR procedure, as detailed below. No aneurysm or dissection in the abdominal or pelvic vasculature. Celiac axis, superior mesenteric artery and inferior mesenteric artery are all widely patent. Single renal arteries bilaterally are patent, although there appears to be mild moderate stenosis at the ostium of both renal arteries. No lymphadenopathy noted in the abdomen or pelvis.  Reproductive: Status post hysterectomy. Ovaries are not confidently identified may be surgically absent or atrophic.  Other: Tiny umbilical hernia containing only omental fat incidentally noted. No significant volume of ascites. No pneumoperitoneum.  Musculoskeletal: There are no aggressive appearing lytic or blastic lesions noted in the visualized portions of the skeleton.  VASCULAR MEASUREMENTS PERTINENT TO TAVR:  AORTA:  Minimal Aortic Diameter -  12 x 9 mm  Severity of Aortic Calcification -   moderate to severe  RIGHT PELVIS:  Right Common Iliac Artery -  Minimal Diameter - 5.0 x 3.8 mm  Tortuosity - mild  Calcification - moderate  Right External Iliac Artery -  Minimal Diameter - 6.8 x 7.0 mm  Tortuosity - mild  Calcification - minimal  Right Common Femoral Artery -  Minimal Diameter - 6.9 x 6.2 mm  Tortuosity - mild  Calcification - minimal  LEFT PELVIS:  Left Common Iliac Artery -  Minimal Diameter - 6.4 x 6.6 mm  Tortuosity - mild  Calcification - moderate  Left External Iliac Artery -  Minimal Diameter - 6.5 x 6.6 mm  Tortuosity - mild to moderate  Calcification - none  Left Common Femoral Artery -  Minimal Diameter - 7.4 x 5.0 mm  Tortuosity - mild  Calcification - mild  Review of the MIP images confirms the above findings.  IMPRESSION: 1. This patient does have suitable pelvic arterial access on the left side, as detailed above. 2. Thickening and calcification of the aortic valve, compatible with the reported clinical history of severe aortic stenosis. 3. Cardiomegaly with moderate left atrial dilatation. 4. Aortic atherosclerosis, in addition to left main and 3 vessel coronary artery disease. Assessment for potential risk factor modification, dietary therapy or pharmacologic therapy may be warranted,  if clinically indicated. 5. In addition, there appears to be mild to moderate stenosis at the ostium of the renal arteries bilaterally. 6. 5 mm pulmonary nodule in the inferior segment of the lingula is nonspecific. No follow-up needed if patient is low-risk. Non-contrast chest CT can be considered in 12 months if patient is high-risk. This recommendation follows the consensus statement: Guidelines for Management of Incidental Pulmonary Nodules Detected on CT Images: From the Fleischner Society 2017; Radiology 2017; 284:228-243. 7. Mild colonic diverticulosis without evidence to suggest an  acute diverticulitis at this time. 8. Additional incidental findings, as above.   Electronically Signed   By: Vinnie Langton M.D.   On: 09/25/2016 08:47   STS Risk Calculator  Procedure    AVR + CABG  Risk of Mortality   5.3% Morbidity or Mortality  30.0% Prolonged LOS   20.3% Short LOS    13.5% Permanent Stroke   2.1% Prolonged Vent Support  23.5% DSW Infection    0.9% Renal Failure    11.2% Reoperation    9.5%    Impression:  Patient has stage D severe symptomatic aortic stenosis. She presents with progressive symptoms of exertional shortness of breath consistent with chronic diastolic congestive heart failure, [place] Heart Association functional class III. She was recently hospitalized for an acute exacerbation of class IV symptoms which improved with aggressive diuretic therapy. The patient ultimately was discharged from the hospital on home oxygen therapy for acute on chronic hypoxic respiratory failure. I have personally reviewed the patient's most recent transthoracic echocardiogram, diagnostic cardiac catheterization, and CT angiograms. Transthoracic echocardiogram confirms the presence of severe aortic stenosis. Aortic valve is trileaflet with significant calcification, thickening, and restricted leaflet mobility involving all 3 leaflets.  One of the leaflets moves better than the other 2, but all 3 are affected.  Peak velocity across the aortic valve measures 4 m/s corresponding to mean transvalvular gradient estimated 40 mmHg. The patient also has a relatively small aortic root which further exacerbates the functional significance of the impaired leaflet mobility. Diagnostic cardiac catheterization confirmed the presence of severe aortic stenosis. There was 75% stenosis involving a large OM 3 branch of the left circumflex coronary artery but otherwise mild nonobstructive disease. The patient does not have symptoms suggestive of ongoing angina pectoris. Cardiac gated CT  angiogram of the heart confirms the presence of anatomical characteristics consistent with severe aortic stenosis in the setting of a small aortic root. The patient would likely need aortic root enlargement or root replacement to avoid the development of patient prosthesis mismatch with conventional surgical aortic valve replacement. Risks associated with conventional surgery would unquestionably be relatively high in this obese patient with multiple comorbid medical problems and somewhat limited physical mobility. Cardiac gated CT angiogram of the heart reveals anatomical characteristics suitable for transcatheter aortic valve replacement without any complicating features, and CT angiography of the aorta and iliac vessels demonstrates what appears to be adequate pelvic access for a transfemoral approach. I feel that transcatheter aortic valve replacement would be far less risky and less invasive alternative to high risk conventional surgery.   Plan:  The patient and her daughter were counseled at length regarding treatment alternatives for management of severe symptomatic aortic stenosis. Alternative approaches such as conventional aortic valve replacement, transcatheter aortic valve replacement, and palliative medical therapy were compared and contrasted at length.  The risks associated with conventional surgical aortic valve replacement were been discussed in detail, as were expectations for post-operative convalescence. Long-term prognosis with medical  therapy was discussed. This discussion was placed in the context of the patient's own specific clinical presentation and past medical history.  All of their questions been addressed.  The patient is interested in proceeding with transcatheter aortic valve replacement as an alternative to high risk conventional surgery as soon as possible.  The patient and her daughter specifically requests that the procedure be performed the week immediately prior to  Christmas, as the patient's daughter will be out of work at that time.  We tentatively plan to proceed with surgery on 10/14/2016 via percutaneous transfemoral approach.    Following the decision to proceed with transcatheter aortic valve replacement, a discussion has been held regarding what types of management strategies would be attempted intraoperatively in the event of life-threatening complications, including whether or not the patient would be considered a candidate for the use of cardiopulmonary bypass and/or conversion to open sternotomy for attempted surgical intervention.  The patient has been advised of a variety of complications that might develop including but not limited to risks of death, stroke, paravalvular leak, aortic dissection or other major vascular complications, aortic annulus rupture, device embolization, cardiac rupture or perforation, mitral regurgitation, acute myocardial infarction, arrhythmia, heart block or bradycardia requiring permanent pacemaker placement, congestive heart failure, respiratory failure, renal failure, pneumonia, infection, other late complications related to structural valve deterioration or migration, or other complications that might ultimately cause a temporary or permanent loss of functional independence or other long term morbidity.  The patient provides full informed consent for the procedure as described and all questions were answered.   I spent in excess of 90 minutes during the conduct of this office consultation and >50% of this time involved direct face-to-face encounter with the patient for counseling and/or coordination of their care.     Valentina Gu. Roxy Manns, MD 09/30/2016 4:05 PM

## 2016-10-14 NOTE — Progress Notes (Signed)
Patient ID: Patricia [name], female   DOB: [birth year], 80 y.o.   MRN: [id number] EVENING ROUNDS NOTE :     [address]       [place] [address]             [phone]                 Day of Surgery Procedure(s) (LRB): TRANSCATHETER AORTIC VALVE REPLACEMENT, TRANSFEMORAL (N/A) TRANSESOPHAGEAL ECHOCARDIOGRAM (TEE) (N/A)  Total Length of Stay:  LOS: 0 days  BP (!) 151/61   Pulse 61   Temp 97.9 F (36.6 C) (Core (Comment))   Resp (!) 21   Ht _0  (1.6 m)   Wt 220 lb (99.8 kg)   SpO2 98%   BMI 38.97 kg/m   .Intake/Output      12/18 0701 - 12/19 0700 12/19 0701 - 12/20 0700   P.O.  240   I.V. (mL/kg)  2172.9 (21.8)   IV Piggyback  50   Total Intake(mL/kg)  2462.9 (24.7)   Urine (mL/kg/hr)  780 (0.7)   Blood  10 (0)   Total Output   790   Net   +1672.9          . sodium chloride    . sodium chloride 1 mL/kg/hr (10/14/16 1700)  . dexmedetomidine Stopped (10/14/16 1232)  . insulin (NOVOLIN-R) infusion 3 Units/hr (10/14/16 1715)  . nitroGLYCERIN 5 mcg/min (10/14/16 1700)  . phenylephrine (NEO-SYNEPHRINE) Adult infusion       Lab Results  Component Value Date   WBC 5.9 10/14/2016   HGB 8.2 (L) 10/14/2016   HCT 24.0 (L) 10/14/2016   PLT 185 10/14/2016   GLUCOSE 191 (H) 10/14/2016   CHOL 182 04/28/2016   TRIG 174 (H) 04/28/2016   HDL 61 04/28/2016   LDLCALC 86 04/28/2016   ALT 35 10/10/2016   AST 26 10/10/2016   NA 138 10/14/2016   K 4.1 10/14/2016   CL 96 (L) 10/14/2016   CREATININE 1.30 (H) 10/14/2016   BUN 32 (H) 10/14/2016   CO2 27 10/10/2016   TSH 1.297 09/12/2016   INR 1.07 10/14/2016   HGBA1C 6.8 (H) 10/10/2016   Stable post op , groin site ok   Grace Isaac MD  Beeper [phone] Office [phone] 10/14/2016 6:01 PM

## 2016-10-14 NOTE — Anesthesia Postprocedure Evaluation (Signed)
Anesthesia Post Note  Patient: Patricia [name]  Procedure(s) Performed: Procedure(s) (LRB): TRANSCATHETER AORTIC VALVE REPLACEMENT, TRANSFEMORAL (N/A) TRANSESOPHAGEAL ECHOCARDIOGRAM (TEE) (N/A)  Patient location during evaluation: SICU Anesthesia Type: General Level of consciousness: sedated Pain management: pain level controlled Vital Signs Assessment: post-procedure vital signs reviewed and stable Respiratory status: patient remains intubated per anesthesia plan Cardiovascular status: stable Anesthetic complications: no       Last Vitals:  Vitals:   10/14/16 1025 10/14/16 1030  BP: 101/69   Pulse: (!) 49 (!) 47  Resp: (!) 24 12  Temp:      Last Pain:  Vitals:   10/14/16 0610  TempSrc:   PainSc: 0-No pain                 Saron Tweed DANIEL

## 2016-10-14 NOTE — Op Note (Addendum)
[facility]   MULTIDISCIPLINARY HEART VALVE TEAM   TAVR OPERATIVE NOTE   Date of Procedure:  10/14/2016  Preoperative Diagnosis: Severe Aortic Stenosis   Postoperative Diagnosis: Same   Procedure:    Transcatheter Aortic Valve Replacement - Percutaneous Left Transfemoral Approach  Edwards Sapien 3 THV (size 23 mm, model # 9600TFX, serial # [id number])   Co-Surgeons:  Valentina Gu. Roxy Manns, MD and Sherren Mocha, MD  Echocardiographer:  Ena Dawley, MD  Anesthesiologist:  Duane Boston, MD  Pre-operative Echo Findings:  Severe aortic stenosis  Normal left ventricular systolic function  Post-operative Echo Findings:  No paravalvular leak  Normal left ventricular systolic function    BRIEF CLINICAL NOTE AND INDICATIONS FOR SURGERY  Patient is a 80 year old obese white female with history of aortic stenosis, hypertension, atrial fibrillation, coronary artery disease, chronic diastolic congestive heart failure, acute on chronic hypoxic respiratory failure, hyperlipidemia, and GI bleeding with anemia who has been referred for a second surgical consultation to discuss treatment options for management of severe symptomatic aortic stenosis and coronary artery disease.  The patient states that she has been told that she had a heart murmur for many years. She never had it formally evaluated until December 2016 when she presented with acute on chronic shortness of breath with hypoxemia. She was found to be in atrial fibrillation with rapid ventricular response and pulmonary edema consistent with class IV congestive heart failure. Echocardiogram performed at that time revealed normal left ventricular systolic function with ejection fraction estimated 55-60%. There was "moderate to severe" aortic stenosis. She was started on amiodarone and anticoagulated using Xarelto and later underwent TEE guided cardioversion. She has been maintaining sinus rhythm ever since. Xarelto was  later stopped because of concerns regarding GI bleeding and chronic anemia. She underwent right shoulder replacement by Dr. Mardelle Matte on 12/18/2015. She required transfusion postoperatively but did not have problems with acute exacerbation of congestive heart failure. She later underwent EGD and colonoscopy revealing gastric and colonic polyps without signs of active bleeding. She has been followed carefully by Dr. Martinique and underwent follow-up echocardiogram 04/30/2016. Left ventricular systolic function remained normal with ejection fraction estimated 55-60%. Peak velocity across the aortic valve was reported 4.0 m/s corresponding to mean transvalvular gradient estimated 40 mmHg. The DVI was reported 0.31.  She was treated medically but continued to complain of progressive symptoms of exertional shortness of breath and fatigue, despite the fact that she has maintained sinus rhythm. Diagnostic cardiac catheterization was performed 09/02/2016 confirming the presence of severe aortic stenosis with mean transvalvular gradient measured 41 mmHg at catheterization. There was moderate pulmonary hypertension and moderate coronary artery disease with 75% stenosis of the large third obtuse marginal branch of the left circumflex but otherwise mild nonobstructive disease. The patient was referred for surgical consultation and has been evaluated previously by Dr. Cyndia Bent. The patient was felt to be moderate to high risk for conventional surgical aortic valve replacement and transcatheter aortic valve replacement has been discussed as an alternative. The patient has subsequently undergone CT angiography and has been referred for second surgical opinion.  The patient has been seen in consultation and counseled at length regarding the indications, risks and potential benefits of surgery.  All questions have been answered, and the patient provides full informed consent for the operation as described.    DETAILS OF THE OPERATIVE  PROCEDURE  PREPARATION:    The patient is brought to the operating room on the above mentioned date and central monitoring was  established by the anesthesia team including placement of Swan-Ganz catheter and radial arterial line. The patient is placed in the supine position on the operating table.  Intravenous antibiotics are administered.  General endotracheal anesthesia is induced uneventfully.  A Foley catheter is placed.  Baseline transesophageal echocardiogram was performed. The patient's chest, abdomen, both groins, and both lower extremities are prepared and draped in a sterile manner. A time out procedure is performed.   PERIPHERAL ACCESS:    Using the modified Seldinger technique, femoral arterial and venous access was obtained with placement of 6 Fr sheaths on the right side.  A pigtail diagnostic catheter was passed through the right arterial sheath under fluoroscopic guidance into the aortic root.  A temporary transvenous pacemaker catheter was passed through the right femoral venous sheath under fluoroscopic guidance into the right ventricle.  The pacemaker was tested to ensure stable lead placement and pacemaker capture. Aortic root angiography was performed in order to determine the optimal angiographic angle for valve deployment.   TRANSFEMORAL ACCESS:   Percutaneous transfemoral access and sheath placement was performed by Dr Burt Knack. Please see his separate operative note for details. The patient was heparinized systemically and ACT verified > 250 seconds.    A 14 Fr transfemoral E-sheath was introduced into the left common femoral artery after progressively dilating over an Amplatz superstiff wire. An AL-1 catheter was used to direct a straight-tip exchange length wire across the native aortic valve into the left ventricle. This was exchanged out for a pigtail catheter and position was confirmed in the LV apex. Simultaneous LV and Ao pressures were recorded.  The pigtail catheter  was exchanged for an Amplatz Extra-stiff wire in the LV apex.  Echocardiography was utilized to confirm appropriate wire position and no sign of entanglement in the mitral subvalvular apparatus.   TRANSCATHETER HEART VALVE DEPLOYMENT:   An Edwards Sapien 3 transcatheter heart valve (size 23 mm, model #9600TFX, serial #[id number]) was prepared and crimped per manufacturer's guidelines, and the proper orientation of the valve is confirmed on the [employer] delivery system. The valve was advanced through the introducer sheath using normal technique until in an appropriate position in the abdominal aorta beyond the sheath tip. The balloon was then retracted and using the fine-tuning wheel was centered on the valve. The valve was then advanced across the aortic arch using appropriate flexion of the catheter. The valve was carefully positioned across the aortic valve annulus. The Commander catheter was retracted using normal technique. Once final position of the valve has been confirmed by angiographic assessment, the valve is deployed while temporarily holding ventilation and during rapid ventricular pacing to maintain systolic blood pressure < 50 mmHg and pulse pressure < 10 mmHg. The balloon inflation is held for >3 seconds after reaching full deployment volume. Once the balloon has fully deflated the balloon is retracted into the ascending aorta and valve function is assessed using echocardiography. There is felt to be no paravalvular leak and no central aortic insufficiency.  The patient's hemodynamic recovery following valve deployment is good.  The deployment balloon and guidewire are both removed. Echo demostrated acceptable post-procedural gradients, stable mitral valve function, and no aortic insufficiency.    PROCEDURE COMPLETION:   The sheath was removed and femoral artery closure performed by Dr Burt Knack. Please see his separate report for details.   Protamine was administered once femoral  arterial repair was complete. The temporary pacemaker, pigtail catheters and femoral sheaths were removed with manual pressure used for hemostasis.  The patient tolerated the procedure well and is transported to the surgical intensive care in stable condition. There were no immediate intraoperative complications. All sponge instrument and needle counts are verified correct at completion of the operation.   No blood products were administered during the operation.  The patient received a total of 96.5 mL of intravenous contrast during the procedure.   Rexene Alberts, MD 10/14/2016 9:36 AM

## 2016-10-14 NOTE — Anesthesia Procedure Notes (Signed)
Procedures  

## 2016-10-14 NOTE — Procedures (Signed)
Extubation Procedure Note  Patient Details:   Name: Patricia [name] DOB: 24-Jan-1936 MRN: [id number]   Airway Documentation:     Evaluation  O2 sats: stable throughout Complications: No apparent complications Patient did tolerate procedure well. Bilateral Breath Sounds: Clear, Diminished   Yes   Patient extubated to 2L nasal cannula.  Positive cuff leak noted.  No evidence of stridor.  Patient able to speak post extubation.  Sats currently 94%.  Vitals are stable.  Incentive spirometry performed x3 with achieved goal of 500.  NIF of -20, VC of 800.  No apparent complications.  Philomena Doheny 10/14/2016, 12:57 PM

## 2016-10-14 NOTE — Op Note (Signed)
  [facility]   MULTIDISCIPLINARY HEART VALVE TEAM   TAVR OPERATIVE NOTE   Date of Procedure:  10/14/2016  Preoperative Diagnosis: Severe Aortic Stenosis   Postoperative Diagnosis: Same   Procedure:   Transcatheter Aortic Valve Replacement - Percutaneous  Transfemoral Approach  Edwards Sapien 3 THV (size 23 mm, model # 9600TFX, serial # [id number])   Co-Surgeons:  Valentina Gu. Roxy Manns, MD and Sherren Mocha, MD  Anesthesiologist:  Duane Boston, MD  Echocardiographer:  Ena Dawley, MD  Pre-operative Echo Findings:  Severe aortic stenosis  Normal left ventricular systolic function  Post-operative Echo Findings:  No paravalvular leak  Unchanged left ventricular systolic function   DETAILS OF THE OPERATIVE PROCEDURE Please see the full operative note of Dr Roxy Manns for details of the entire surgical procedure and it's indications.   PERIPHERAL ACCESS:   Using the modified Seldinger technique, femoral arterial and venous access was obtained with placement of 6 Fr sheaths on the right side.  A pigtail diagnostic catheter was passed through the right femoral arterial sheath under fluoroscopic guidance into the aortic root.  A temporary transvenous pacemaker catheter was passed through the right femoral venous sheath under fluoroscopic guidance into the right ventricle.  The pacemaker was tested to ensure stable lead placement and pacemaker capture. Aortic root angiography was performed in order to determine the optimal angiographic angle for valve deployment.  TRANSFEMORAL ACCESS:  A micropuncture technique is used to access the left femoral artery under fluoroscopic guidance.  Femoral angiography is performed to verify access in the common femoral artery. 2 Perclose devices are deployed at 10' and 2' positions to 'PreClose' the femoral artery. An 8 French sheath is placed and then an Amplatz Superstiff wire is advanced through the sheath. This is changed out for a 14  French transfemoral E-Sheath after progressively dilating over the Superstiff wire.   PROCEDURE COMPLETION:  The sheath was removed and femoral artery closure is performed using the 2 previously deployed Perclose devices. Protamine was administered once femoral arterial repair was complete. The temporary pacemaker, pigtail catheters and femoral sheaths were removed with manual pressure used for hemostasis.   The patient tolerated the procedure well and is transported to the surgical intensive care in stable condition. There were no immediate intraoperative complications. All sponge instrument and needle counts are verified correct at completion of the operation.   The patient received a total of 97 mL of intravenous contrast during the procedure.  Sherren Mocha, MD 10/14/2016 4:39 PM

## 2016-10-14 NOTE — Anesthesia Procedure Notes (Signed)
Central Venous Catheter Insertion Performed by: Rica Koyanagi, anesthesiologist Start/End12/19/2017 7:05 AM, 10/14/2016 7:20 AM Preanesthetic checklist: patient identified, IV checked, site marked, risks and benefits discussed, surgical consent, monitors and equipment checked, pre-op evaluation and timeout performed Position: Trendelenburg Lidocaine 1% used for infiltration and patient sedated Hand hygiene performed , maximum sterile barriers used  and Seldinger technique used Catheter size: 8 Fr Central line was placed.Double lumen Procedure performed using ultrasound guided technique. Ultrasound Notes:anatomy identified, needle tip was noted to be adjacent to the nerve/plexus identified, no ultrasound evidence of intravascular and/or intraneural injection and image(s) printed for medical record Attempts: 2 Following insertion, line sutured, dressing applied and Biopatch. Post procedure assessment: blood return through all ports, free fluid flow and no air  Patient tolerated the procedure well with no immediate complications.

## 2016-10-14 NOTE — Care Management Note (Signed)
Case Management Note  Patient Details  Name: Patricia [name] MRN: [id number] Date of Birth: [birth year]  Subjective/Objective:    S/p TAVR                  Action/Plan:  PTA from home independent.  CM will continue to follow for discharge needs   Expected Discharge Date:                  Expected Discharge Plan:     In-House Referral:     Discharge planning Services  CM Consult  Post Acute Care Choice:    Choice offered to:     DME Arranged:    DME Agency:     HH Arranged:    HH Agency:     Status of Service:  In process, will continue to follow  If discussed at Long Length of Stay Meetings, dates discussed:    Additional Comments:  Maryclare Labrador, RN 10/14/2016, 4:10 PM

## 2016-10-14 NOTE — Progress Notes (Signed)
Rapid wean protocol initiated.

## 2016-10-14 NOTE — Anesthesia Procedure Notes (Addendum)
Procedure Name: Intubation Date/Time: 10/14/2016 7:48 AM Performed by: Duane Boston Pre-anesthesia Checklist: Patient identified, Emergency Drugs available, Suction available and Patient being monitored Patient Re-evaluated:Patient Re-evaluated prior to inductionOxygen Delivery Method: Circle system utilized Preoxygenation: Pre-oxygenation with 100% oxygen Intubation Type: IV induction Ventilation: Mask ventilation without difficulty and Oral airway inserted - appropriate to patient size Laryngoscope Size: Mac and 3 Grade View: Grade I Tube type: Oral Tube size: 8.0 mm Number of attempts: 1 Airway Equipment and Method: Stylet Placement Confirmation: ETT inserted through vocal cords under direct vision,  positive ETCO2 and breath sounds checked- equal and bilateral Secured at: 21 cm Tube secured with: Tape Dental Injury: Teeth and Oropharynx as per pre-operative assessment  Comments: Performed by Albertha Ghee, SRNA with direct supervision

## 2016-10-14 NOTE — Interval H&P Note (Signed)
History and Physical Interval Note:  10/14/2016 7:24 AM  Patricia [name]  has presented today for surgery, with the diagnosis of SEVERE Aortic Stenosis  The various methods of treatment have been discussed with the patient and family. After consideration of risks, benefits and other options for treatment, the patient has consented to  Procedure(s): TRANSCATHETER AORTIC VALVE REPLACEMENT, TRANSFEMORAL (N/A) TRANSESOPHAGEAL ECHOCARDIOGRAM (TEE) (N/A) as a surgical intervention .  The patient's history has been reviewed, patient examined, no change in status, stable for surgery.  I have reviewed the patient's chart and labs.  Questions were answered to the patient's satisfaction.     Rexene Alberts

## 2016-10-15 ENCOUNTER — Inpatient Hospital Stay (HOSPITAL_COMMUNITY): Payer: Medicare Other

## 2016-10-15 ENCOUNTER — Other Ambulatory Visit: Payer: Self-pay

## 2016-10-15 ENCOUNTER — Encounter (HOSPITAL_COMMUNITY): Payer: Self-pay | Admitting: Cardiovascular Disease

## 2016-10-15 DIAGNOSIS — I350 Nonrheumatic aortic (valve) stenosis: Secondary | ICD-10-CM

## 2016-10-15 DIAGNOSIS — Z952 Presence of prosthetic heart valve: Secondary | ICD-10-CM

## 2016-10-15 DIAGNOSIS — Z954 Presence of other heart-valve replacement: Secondary | ICD-10-CM

## 2016-10-15 DIAGNOSIS — I5033 Acute on chronic diastolic (congestive) heart failure: Secondary | ICD-10-CM

## 2016-10-15 DIAGNOSIS — I369 Nonrheumatic tricuspid valve disorder, unspecified: Secondary | ICD-10-CM

## 2016-10-15 LAB — GLUCOSE, CAPILLARY
Glucose-Capillary: 80 mg/dL (ref 65–99)
Glucose-Capillary: 113 mg/dL — ABNORMAL HIGH (ref 65–99)
Glucose-Capillary: 121 mg/dL — ABNORMAL HIGH (ref 65–99)
Glucose-Capillary: 124 mg/dL — ABNORMAL HIGH (ref 65–99)
Glucose-Capillary: 112 mg/dL — ABNORMAL HIGH (ref 65–99)
Glucose-Capillary: 88 mg/dL (ref 65–99)
Glucose-Capillary: 95 mg/dL (ref 65–99)
Glucose-Capillary: 110 mg/dL — ABNORMAL HIGH (ref 65–99)
Glucose-Capillary: 207 mg/dL — ABNORMAL HIGH (ref 65–99)
Glucose-Capillary: 129 mg/dL — ABNORMAL HIGH (ref 65–99)
Glucose-Capillary: 216 mg/dL — ABNORMAL HIGH (ref 65–99)
Glucose-Capillary: 126 mg/dL — ABNORMAL HIGH (ref 65–99)
Glucose-Capillary: 122 mg/dL — ABNORMAL HIGH (ref 65–99)

## 2016-10-15 LAB — ECHOCARDIOGRAM COMPLETE
Height: 63 in
Weight: 3569.69 oz

## 2016-10-15 LAB — MAGNESIUM: Magnesium: 1.9 mg/dL (ref 1.7–2.4)

## 2016-10-15 LAB — CBC
HCT: 26.6 % — ABNORMAL LOW (ref 36.0–46.0)
Hemoglobin: 8.4 g/dL — ABNORMAL LOW (ref 12.0–15.0)
MCH: 27.2 pg (ref 26.0–34.0)
MCHC: 31.6 g/dL (ref 30.0–36.0)
MCV: 86.1 fL (ref 78.0–100.0)
Platelets: 201 10*3/uL (ref 150–400)
RBC: 3.09 MIL/uL — ABNORMAL LOW (ref 3.87–5.11)
RDW: 16.2 % — ABNORMAL HIGH (ref 11.5–15.5)
WBC: 7.9 10*3/uL (ref 4.0–10.5)

## 2016-10-15 LAB — BASIC METABOLIC PANEL
Anion gap: 7 (ref 5–15)
BUN: 23 mg/dL — ABNORMAL HIGH (ref 6–20)
CO2: 30 mmol/L (ref 22–32)
Calcium: 8.8 mg/dL — ABNORMAL LOW (ref 8.9–10.3)
Chloride: 101 mmol/L (ref 101–111)
Creatinine, Ser: 1.24 mg/dL — ABNORMAL HIGH (ref 0.44–1.00)
GFR calc Af Amer: 47 mL/min — ABNORMAL LOW (ref >60)
GFR calc non Af Amer: 41 mL/min — ABNORMAL LOW (ref >60)
Glucose, Bld: 118 mg/dL — ABNORMAL HIGH (ref 65–99)
Potassium: 4.6 mmol/L (ref 3.5–5.1)
Sodium: 138 mmol/L (ref 135–145)

## 2016-10-15 MED ORDER — PANTOPRAZOLE SODIUM 40 MG PO TBEC: 40.0000 mg | DELAYED_RELEASE_TABLET | Freq: Every day | ORAL | Status: DC

## 2016-10-15 MED ORDER — ACETAMINOPHEN 500 MG PO TABS
500.0000 mg | ORAL_TABLET | Freq: Four times a day (QID) | ORAL | Status: DC | PRN
  Administered 2016-10-15 – 2016-10-16 (×2): 500 mg via ORAL
  Filled 2016-10-15 (×2): qty 1

## 2016-10-15 MED ORDER — DOXAZOSIN MESYLATE 2 MG PO TABS
4.0000 mg | ORAL_TABLET | Freq: Every day | ORAL | Status: DC
  Administered 2016-10-15 – 2016-10-16 (×2): 4 mg via ORAL
  Filled 2016-10-15: qty 1
  Filled 2016-10-15: qty 2

## 2016-10-15 MED ORDER — POTASSIUM GLUCONATE 595 (99 K) MG PO TABS: 595.0000 mg | ORAL_TABLET | Freq: Every day | ORAL | Status: DC

## 2016-10-15 MED ORDER — FUROSEMIDE 40 MG PO TABS
40.0000 mg | ORAL_TABLET | Freq: Every day | ORAL | Status: DC
  Filled 2016-10-15: qty 1

## 2016-10-15 MED ORDER — LEVALBUTEROL HCL 0.63 MG/3ML IN NEBU
0.6300 mg | INHALATION_SOLUTION | Freq: Four times a day (QID) | RESPIRATORY_TRACT | Status: DC | PRN
  Administered 2016-10-15: 0.63 mg via RESPIRATORY_TRACT
  Filled 2016-10-15: qty 3

## 2016-10-15 MED ORDER — AMIODARONE HCL 200 MG PO TABS
200.0000 mg | ORAL_TABLET | Freq: Every day | ORAL | Status: DC
  Administered 2016-10-15 – 2016-10-16 (×2): 200 mg via ORAL
  Filled 2016-10-15 (×2): qty 1

## 2016-10-15 MED ORDER — DILTIAZEM HCL ER COATED BEADS 180 MG PO CP24
180.0000 mg | ORAL_CAPSULE | Freq: Every day | ORAL | Status: DC
  Administered 2016-10-15: 180 mg via ORAL
  Filled 2016-10-15: qty 1

## 2016-10-15 MED ORDER — MOMETASONE FURO-FORMOTEROL FUM 100-5 MCG/ACT IN AERO
2.0000 | INHALATION_SPRAY | Freq: Two times a day (BID) | RESPIRATORY_TRACT | Status: DC
  Administered 2016-10-16: 2 via RESPIRATORY_TRACT
  Filled 2016-10-15: qty 8.8

## 2016-10-15 MED ORDER — ASPIRIN 81 MG PO TBEC: 81.0000 mg | DELAYED_RELEASE_TABLET | Freq: Every day | ORAL | Status: DC

## 2016-10-15 MED ORDER — METFORMIN HCL 500 MG PO TABS
500.0000 mg | ORAL_TABLET | Freq: Two times a day (BID) | ORAL | Status: DC
  Filled 2016-10-15: qty 1

## 2016-10-15 MED ORDER — APIXABAN 5 MG PO TABS
5.0000 mg | ORAL_TABLET | Freq: Two times a day (BID) | ORAL | Status: DC
  Administered 2016-10-16: 5 mg via ORAL
  Filled 2016-10-15: qty 1

## 2016-10-15 MED ORDER — CHLORHEXIDINE GLUCONATE 0.12 % MT SOLN: 15.0000 mL | Freq: Two times a day (BID) | OROMUCOSAL | Status: DC

## 2016-10-15 MED ORDER — ENOXAPARIN SODIUM 30 MG/0.3ML ~~LOC~~ SOLN: 30.0000 mg | Freq: Every day | SUBCUTANEOUS | Status: DC

## 2016-10-15 MED ORDER — GLIMEPIRIDE 4 MG PO TABS
2.0000 mg | ORAL_TABLET | Freq: Every day | ORAL | Status: DC
  Administered 2016-10-16: 2 mg via ORAL
  Filled 2016-10-15: qty 1

## 2016-10-15 MED ORDER — INSULIN ASPART 100 UNIT/ML ~~LOC~~ SOLN: 0.0000 [IU] | SUBCUTANEOUS | Status: DC

## 2016-10-15 MED ORDER — FUROSEMIDE 10 MG/ML IJ SOLN
40.0000 mg | Freq: Once | INTRAMUSCULAR | Status: AC
  Administered 2016-10-15: 40 mg via INTRAVENOUS
  Filled 2016-10-15: qty 4

## 2016-10-15 MED ORDER — LISINOPRIL 10 MG PO TABS
10.0000 mg | ORAL_TABLET | Freq: Every day | ORAL | Status: DC
  Administered 2016-10-16: 10 mg via ORAL
  Filled 2016-10-15 (×2): qty 1

## 2016-10-15 MED ORDER — INSULIN ASPART 100 UNIT/ML ~~LOC~~ SOLN
0.0000 [IU] | SUBCUTANEOUS | Status: DC
  Administered 2016-10-15 (×2): 2 [IU] via SUBCUTANEOUS
  Administered 2016-10-15: 8 [IU] via SUBCUTANEOUS
  Administered 2016-10-15: 2 [IU] via SUBCUTANEOUS

## 2016-10-15 MED ORDER — PRAVASTATIN SODIUM 20 MG PO TABS: 20.0000 mg | ORAL_TABLET | Freq: Every evening | ORAL | Status: DC

## 2016-10-15 MED ORDER — ASPIRIN 81 MG PO CHEW: 81.0000 mg | CHEWABLE_TABLET | Freq: Every day | ORAL | Status: DC

## 2016-10-15 MED ORDER — FERROUS SULFATE 325 (65 FE) MG PO TABS
325.0000 mg | ORAL_TABLET | Freq: Every day | ORAL | Status: DC
  Administered 2016-10-15 – 2016-10-16 (×2): 325 mg via ORAL
  Filled 2016-10-15 (×2): qty 1

## 2016-10-15 MED ORDER — LEVOTHYROXINE SODIUM 50 MCG PO TABS
50.0000 ug | ORAL_TABLET | Freq: Every day | ORAL | Status: DC
  Administered 2016-10-15 – 2016-10-16 (×2): 50 ug via ORAL
  Filled 2016-10-15 (×2): qty 1

## 2016-10-15 MED ORDER — INSULIN DETEMIR 100 UNIT/ML ~~LOC~~ SOLN
20.0000 [IU] | Freq: Two times a day (BID) | SUBCUTANEOUS | Status: DC
  Administered 2016-10-15: 20 [IU] via SUBCUTANEOUS
  Filled 2016-10-15 (×2): qty 0.2

## 2016-10-15 MED ORDER — LEVALBUTEROL HCL 0.63 MG/3ML IN NEBU: 0.6300 mg | INHALATION_SOLUTION | Freq: Three times a day (TID) | RESPIRATORY_TRACT | Status: DC

## 2016-10-15 MED FILL — Dexmedetomidine HCl in NaCl 0.9% IV Soln 400 MCG/100ML: INTRAVENOUS | Qty: 100 | Status: AC

## 2016-10-15 NOTE — Progress Notes (Addendum)
    Subjective:  Had an 'asthma attack' this am. Feels better after albuterol tx. No chest pain. Didn't sleep last night. No other c/o.   Objective:  Vital Signs in the last 24 hours: Temp:  [95.9 F (35.5 C)-98.6 F (37 C)] 98.6 F (37 C) (12/20 0400) Pulse Rate:  [45-69] 64 (12/20 0700) Resp:  [12-24] 23 (12/20 0700) BP: (101-157)/(46-88) 121/88 (12/20 0700) SpO2:  [94 %-100 %] 95 % (12/20 0700) Arterial Line BP: (139-173)/(39-58) 173/54 (12/19 1615) FiO2 (%):  [40 %-50 %] 40 % (12/19 1208) Weight:  [223 lb 1.7 oz (101.2 kg)] 223 lb 1.7 oz (101.2 kg) (12/20 0500)  Intake/Output from previous day: 12/19 0701 - 12/20 0700 In: 3281.8 [P.O.:240; I.V.:2741.8; IV Piggyback:300] Out: 2280 [Urine:2270; Blood:10]  Physical Exam: Pt is alert and oriented, NAD HEENT: normal Neck: JVP - difficult to visualize Lungs: CTA bilaterally CV: RRR with 1/6 SEM at the LLSB Abd: soft, NT, Positive BS, no hepatomegaly Ext: no C/C/E, distal pulses intact and equal, bilateral groin sites clear Skin: warm/dry no rash   Lab Results:  Recent Labs  10/14/16 1035 10/14/16 1036 10/15/16 0400  WBC 5.9  --  7.9  HGB 8.3* 8.2* 8.4*  PLT 185  --  201    Recent Labs  10/14/16 0943 10/14/16 1036 10/15/16 0400  NA 137 138 138  K 4.1 4.1 4.6  CL 96*  --  101  CO2  --   --  30  GLUCOSE 196* 191* 118*  BUN 32*  --  23*  CREATININE 1.30*  --  1.24*   No results for input(s): TROPONINI in the last 72 hours.  Invalid input(s): CK, MB  Tele: Sinus rhythm, HR 60 bpm, personally reviewed  Assessment/Plan:  1. Acute on chronic diastolic CHF, NYHA III 2. Severe aortic stenosis s/p TAVR POD #1 3. Anemia, chronic - stable HgB 8.4 this am 4. Type II DM 5. HTN 6. Paroxysmal atrial fibrillation 7. LBBB - new  Weight up a few pounds and CXR with vascular congestion by my review. Will give lasix 40 mg IV x 1 this am and start her back on oral furosemide tomorrow. Discussed anticoagulation  strategy with Dr Martinique - pt with PAF and high CHADS-Vasc but also with hx anemia and heme positive stool. Will start Eliquis tomorrow, stop ASA once Eliquis is started. Continue Eliquis as long as H/H remains stable. Probably ok to tx to tele today. Transition from insulin gtt to an insulin regimen.    Sherren Mocha, M.D. 10/15/2016, 8:09 AM

## 2016-10-15 NOTE — Progress Notes (Signed)
10/15/2016 1445 Received pt to room 2W11 from 2S.  Pt is A&O, no c/o voiced.  Tele monitor applied and CCMD notified.  Oriented to room, call light and bed.  Call bell in reach.  Echo tech here to perform echo. [place]

## 2016-10-15 NOTE — Progress Notes (Signed)
  Echocardiogram 2D Echocardiogram has been performed.  Patricia [name] 10/15/2016, 3:43 PM

## 2016-10-15 NOTE — Discharge Instructions (Signed)
What to do after you leave the hospital:  ACTIVITY AND EXERCISE  Daily activity and exercise are an important part of your recovery. People recover at different rates depending on their general health and type of valve procedure.  Most people require six to 10 weeks to feel recovered.  No lifting, pushing, pulling more than 10 pounds (examples to avoid: groceries, vacuuming, gardening, golfing): - For one week with a procedure through the groin. - For six weeks for procedures through the chest wall. - For three months for procedures through the breast-bone.  After the initial healing process of the access site, we recommend cardiac rehabilitation for all TAVR patients. Cardiac rehabilitation will help you: - Rebuild stamina, strength and balance. - Learn how to participate in activities safely, as well as help you regain confidence to do so. - Return to activities of daily living and leisure.  Discuss attending cardiac rehabilitation at your follow-up appointment   DRIVING  Do not drive for four weeks after the date of your procedure.  If you have been told by your doctor in the past that you may not drive, you must talk with him/her before you begin driving again.  When you resume driving, you must have someone with you.  HYGIENE If you had a femoral (leg) procedure, you may take a shower when you return home. After the shower, pat the site dry. Do NOT use powder, oils or lotions in your groin area until the site has completely healed.  If you had a chest procedure, you may shower when you return home unless specifically instructed not to by your discharging practitioner. - DO NOT scrub incision; pat dry with a towel - DO NOT apply any lotions, oils, powders to the incision - No tub baths / swimming for at least six weeks.  SITE CARE  You likely will have small openings in both groins from catheters used during the procedure. If you had a transfemoral  procedure, one groin will have a larger opening and may be bruised or tender.  If you had a chest procedure, you will have either a small incision in your upper sternum (breast-bone) or between your ribs on your left side. - Chest wall site: The surgical incision should be kept dry (no lotions / oils / powders) and open to air. If you experience irritation from clothing rubbing on the incision, a light gauze dressing may be applied. - Inspect your incision daily; notify your physician if there is increased redness, swelling or drainage from the incision. - If the incision is located on your breast-bone you must avoid lifting objects heavier than a gallon of milk (eight pounds) and stretching / twisting / pulling with your arms for at least three months to ensure strong bone healing.   CONTACT [phone]-  Check your sites daily. Contact our office if you have any of the following problems: - Redness and warmth that does not go away - Yellow or green drainage from the wound - Fever and chills - Increasing numbness in your legs - Worsening pain at the site  If you had a leg/groin procedure, it is normal to have bruising or a soft lump at the site. It is not normal if the lump suddenly becomes larger or more firm. This may mean you are bleeding. If this happens: - Lie down - Have someone press down hard, just above the hole in your skin where the procedure was performed for 15 minutes. If after holding on the site,  the lump does not become larger or harder, they are performing this correctly. - If the bleeding has stopped after 15 minutes, rest and stay laying down for at least two hours. - If the bleeding continues, call 911 for an ambulance. Do NOT drive yourself or have someone else drive you.

## 2016-10-15 NOTE — Discharge Summary (Signed)
Physician Discharge Summary  Patient ID: Patricia [name] MRN: [id number] DOB/AGE: 04/24/1936 80 y.o.  Admit date: 10/14/2016 Discharge date: 10/16/2016  Admission Diagnoses:Severe aortic stenosis  Discharge Diagnoses:  Principal Problem:   S/P TAVR (transcatheter aortic valve replacement) Active Problems:   Essential hypertension   DM type 2 (diabetes mellitus, type 2) (HCC)   Hypothyroidism   COPD (chronic obstructive pulmonary disease) (HCC)   Anemia   Atrial fibrillation (HCC)   Chronic diastolic CHF (congestive heart failure) (HCC)   Aortic valve stenosis   SOB (shortness of breath)   Patient Active Problem List   Diagnosis Date Noted  . 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 Acute respiratory failure with hypoxia ([place])   . SOB (shortness of breath)   . Aortic valve stenosis 09/11/2016  . Acute respiratory failure ([place]) 09/11/2016  . Acute blood loss anemia 02/06/2016  . Chronic diastolic CHF (congestive heart failure) ([place]) 02/06/2016  . Atrial fibrillation ([place]) 02/05/2016  . Primary localized osteoarthrosis of right shoulder 12/18/2015  . Osteoarthritis of right shoulder 12/18/2015  . Post-nasal drainage 11/27/2015  . Acute congestive heart failure ([place])   . Anemia 09/26/2015  . Neck pain 05/23/2015  . Knee pain, bilateral 07/20/2014  . Lumbago 11/02/2013  . Trigger finger, acquired 11/02/2013  . Hyperlipidemia 11/02/2013  . Urinary incontinence 11/02/2013  . Rash and nonspecific skin eruption 11/02/2013  . COPD (chronic obstructive pulmonary disease) ([place])   . Hypothyroidism   . Obesity   . Essential hypertension 03/02/2013  . DM type 2 (diabetes mellitus, type 2) ([place]) 03/02/2013   HPI:  Patient is a 80 year old obese white female with history of aortic stenosis, hypertension, atrial fibrillation, coronary artery disease, chronic diastolic congestive heart failure, acute on chronic hypoxic respiratory failure, hyperlipidemia,  and GI bleeding with anemia who has been referred for a second surgical consultation to discuss treatment options for management of severe symptomatic aortic stenosis and coronary artery disease.  The patient states that she has been told that she had a heart murmur for many years. She never had it formally evaluated until December 2016 when she presented with acute on chronic shortness of breath with hypoxemia. She was found to be in atrial fibrillation with rapid ventricular response and pulmonary edema consistent with class IV congestive heart failure. Echocardiogram performed at that time revealed normal left ventricular systolic function with ejection fraction estimated 55-60%. There was "moderate to severe" aortic stenosis. She was started on amiodarone and anticoagulated using Xarelto and later underwent TEE guided cardioversion. She has been maintaining sinus rhythm ever since. Xarelto was later stopped because of concerns regarding GI bleeding and chronic anemia. She underwent right shoulder replacement by Dr. Mardelle Matte on 12/18/2015. She required transfusion postoperatively but did not have problems with acute exacerbation of congestive heart failure. She later underwent EGD and colonoscopy revealing gastric and colonic polyps without signs of active bleeding. She has been followed carefully by Dr. Martinique and underwent follow-up echocardiogram 04/30/2016. Left ventricular systolic function remained normal with ejection fraction estimated 55-60%. Peak velocity across the aortic valve was reported 4.0 m/s corresponding to mean transvalvular gradient estimated 40 mmHg. The DVI was reported 0.31.  She was treated medically but continued to complain of progressive symptoms of exertional shortness of breath and fatigue, despite the fact that she has maintained sinus rhythm. Diagnostic cardiac catheterization was performed 09/02/2016 confirming the presence of severe aortic stenosis with mean transvalvular gradient  measured 41 mmHg at catheterization. There was moderate pulmonary  hypertension and moderate coronary artery disease with 75% stenosis of the large third obtuse marginal branch of the left circumflex but otherwise mild nonobstructive disease. The patient was referred for surgical consultation and has been evaluated previously by Dr. Cyndia Bent. The patient was felt to be moderate to high risk for conventional surgical aortic valve replacement and transcatheter aortic valve replacement has been discussed as an alternative. The patient has subsequently undergone CT angiography and has been referred for second surgical opinion. Of note, approximately 2 weeks ago the patient was hospitalized briefly for acute exacerbation of chronic diastolic congestive heart failure with hypoxemia. She was diuresed aggressively in the hospital and her home Lasix dose was increased.  She was discharged from the hospital with home oxygen therapy.  The patient is divorced and lives alone in [place] where she remains functionally independent. 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 She has 1 grown daughter who accompanies her to the office for her consultation today. The patient has remained functionally independent all of her adult life and throughout retirement. She admits to decreased physical activity over the last couple of years primarily because of progressive exertional shortness of breath. The patient also has problems with chronic pain in her lower back and knees related to degenerative arthritis. She describes a long history of progressive exertional shortness of breath that has gotten considerably worse over the past year. She has been stable since her most recent hospital discharge 2 weeks ago although she still gets short of breath with moderate low level activity. Oxygen therapy seems to help somewhat. She denies any history of PND, orthopnea, palpitations, or  syncope. She reports occasional dizzy spells. She has had some lower extremity edema that has improved with increased Lasix. She denies any history of exertional chest pain or chest tightness. Her mobility is also somewhat limited because of arthritis pain in her back and knees. She reports some mild difficulty with balance but she does not use a cane or a walker.  The patient was admitted for the TAVR  procedure electively.  Discharged Condition: good  Hospital Course: The patient was admitted electively and on 10/14/2016 was taken to the operating room where she underwent the below described procedure. She tolerated it well and was taken to the surgical intensive care unit in stable condition.  Postoperative hospital course:  The patient has done quite well. She is hemodynamically stable in sinus rhythm with a new first-degree AV block and left bundle branch block. Oxygen is being weaned. She has some mild postoperative volume overload and is getting diuretics. Preoperative medications for hypertension has been restarted. She will be transitioned to her diabetes home medications prior to discharge as well. She will also have a echocardiogram. She will be restarted on Eliquis at time of  discharge and aspirin will be stopped. She is tolerating gradually increasing activities using standard protocols. Cannulation sites are healing without evidence of bleeding or infection. She has a stable chronic anemia and we are watching her hemoglobin closely. At time of discharge she is felt to be quite stable.   Significant Diagnostic Studies: cardiac graphics:   Echocardiogram:   - Left ventricle: The cavity size was normal. There was moderate   concentric hypertrophy. Systolic function was vigorous. The   estimated ejection fraction was in the range of 65% to 70%. Wall   motion was normal; there were no regional wall motion   abnormalities. Features are consistent with  a pseudonormal left    ventricular filling pattern, with concomitant abnormal relaxation   and increased filling pressure (grade 2 diastolic dysfunction).   Doppler parameters are consistent with elevated ventricular   end-diastolic filling pressure. - Aortic valve: Peak gradient (S): 33 mm Hg. Valve area (VTI): 1.18   cm^2. Valve area (Vmax): 1.14 cm^2. Valve area (Vmean): 1.19   cm^2. - Aortic root: The aortic root was normal in size. - Mitral valve: Structurally normal valve. There was mild   regurgitation directed centrally. - Left atrium: The atrium was moderately dilated. - Right ventricle: Systolic function was normal. - Right atrium: The atrium was normal in size. - Tricuspid valve: There was mild regurgitation. - Pulmonary arteries: Systolic pressure was mildly increased. PA   peak pressure: 37 mm Hg (S). - Inferior vena cava: The vessel was normal in size. - Pericardium, extracardiac: There was no pericardial effusion.  Impressions:  - S/P TAVR with a 23 mm Edwards-SAPIEN valve. Transaortic gradients   are on the higher end of normal (mean 18 mmHg, Vmax 2.8 m/sec),   however this is most probably sec to known small annular size.   There is no central aortic regurgitation or paravalvular leak.  Treatments: surgery:  TAVR OPERATIVE NOTE   Date of Procedure:                10/14/2016  Preoperative Diagnosis:      Severe Aortic Stenosis   Postoperative Diagnosis:    Same   Procedure:        Transcatheter Aortic Valve Replacement - Percutaneous Left Transfemoral Approach             Edwards Sapien 3 THV (size 23 mm, model # 9600TFX, serial # [id number])              Co-Surgeons:                        Valentina Gu. Roxy Manns, MD and Sherren Mocha, MD  Echocardiographer:              Ena Dawley, MD  Anesthesiologist:                  Duane Boston, MD  Pre-operative Echo Findings: ? Severe aortic stenosis ? Normal left ventricular systolic function  Post-operative Echo  Findings: ? No paravalvular leak ? Normal left ventricular systolic function ?   Disposition: 01-Home or Self Care   The patient has been discharged on:   1.Beta Blocker:  Yes [   ]                              No   [x  ]                              If No, reason: new LBBB with AV Block  2.Ace Inhibitor/ARB: Yes [ x  ]                                     No  [    ]                                     If No,  reason:  3.Statin:   Yes [   ]                  No  [ x  ]                  If No, reason: no CAD  4.Shela CommonsVelta Addison  [   ]                  No   [x  ]                  If No, reason: On Eliquis, H/O Gi Bleeding      Allergies as of 10/16/2016      Reactions   Codeine Other (See Comments)   "spaces her out, dizziness, can't walk well"   Vesicare [solifenacin] Itching      Medication List    STOP taking these medications   aspirin 81 MG EC tablet   diltiazem 180 MG 24 hr capsule Commonly known as:  CARDIZEM CD     TAKE these medications   acetaminophen 500 MG tablet Commonly known as:  TYLENOL Take 500 mg by mouth every 6 (six) hours as needed for mild pain.   ADVAIR DISKUS 100-50 MCG/DOSE Aepb Generic drug:  Fluticasone-Salmeterol Inhale 1 inhalation two  times daily for breathing   albuterol 108 (90 Base) MCG/ACT inhaler Commonly known as:  PROVENTIL HFA;VENTOLIN HFA Inhale 2 puffs into the lungs every 6 (six) hours as needed for wheezing or shortness of breath.   ALLERGY EYE DROPS OP Apply 1 drop to eye daily as needed (allergies).   amiodarone 200 MG tablet Commonly known as:  PACERONE Take one tablet daily by mouth   apixaban 5 MG Tabs tablet Commonly known as:  ELIQUIS Take 1 tablet (5 mg total) by mouth 2 (two) times daily.   cetirizine 10 MG tablet Commonly known as:  ZYRTEC Take 10 mg by mouth daily as needed for allergies.   chlorhexidine 0.12 % solution Commonly known as:  PERIDEX Rinse with 15 mls twice daily for 30 seconds. Use  after breakfast and at bedtime. Spit out excess. Do not swallow.   cholecalciferol 1000 units tablet Commonly known as:  VITAMIN D One daily for vitamin D supplement What changed:  how much to take  how to take this  when to take this  additional instructions   COMFORT LANCETS Misc Use daily to get blood for glucometer   doxazosin 4 MG tablet Commonly known as:  CARDURA TAKE 1 TABLET BY MOUTH ONCE DAILY TO HELP CONTROL BLOOD PRESSURE   ferrous sulfate 325 (65 FE) MG EC tablet Take 1 tablet (325 mg total) by mouth daily with breakfast.   furosemide 40 MG tablet Commonly known as:  LASIX Take 1 tablet (40 mg total) by mouth daily.   glimepiride 2 MG tablet Commonly known as:  AMARYL Take 1 tablet by mouth at  breakfast to control  Glucose   glucose blood test strip Commonly known as:  BAYER CONTOUR TEST Use daily too check glucose   levothyroxine 50 MCG tablet Commonly known as:  SYNTHROID, LEVOTHROID Take 1 tablet by mouth  daily for thyroid  supplement What changed:  See the new instructions.   lisinopril 10 MG tablet Commonly known as:  PRINIVIL,ZESTRIL Take 1 tablet (10 mg total) by mouth daily.   metFORMIN 500 MG tablet Commonly known as:  GLUCOPHAGE Take 500 mg by mouth 2 (two)  times daily with a meal.   pantoprazole 40 MG tablet Commonly known as:  PROTONIX Take 1 tablet (40 mg total) by mouth daily.   potassium gluconate 595 (99 K) MG Tabs tablet Take 595 mg by mouth daily. TAKES ONLY WHEN USING FUROSEMIDE   pravastatin 20 MG tablet Commonly known as:  PRAVACHOL TAKE 1 TABLET BY MOUTH  DAILY What changed:  how much to take  when to take this   triamcinolone 0.025 % cream Commonly known as:  KENALOG Apply 1 application topically daily as needed (psoriasis).      Follow-up Information    Sherren Mocha, MD Follow up.   Specialty:  Cardiology Why:  Please see your discharge instruction packet  appointments to see Dr. Burt Knack as well as to  get your follow-up echocardiogram. Contact information: [address] [place] [address] [phone]        Sherren Mocha, MD .   Specialty:  Cardiology Contact information: [place] [address] [phone]         The patient has been discharged on:   1.Beta Blocker:  Yes [   ]                              No   [ n  ]                              If No, reason:new first degree block, LBBB  2.Ace Inhibitor/ARB: Yes [   ]                                     No  [ n   ]                                     If No, reason:on other BP meds  3.Statin:   Yes [   ]                  No  [n ]                  If No, reason:no clinical reason without CAD or hypercholesterolemia  4.Shela CommonsVelta Addison  [   ]                  No   [ n  ]                  If No, reason:on Eliquis   Signed: [place] Sunday 10/16/2016, 8:44 AM

## 2016-10-15 NOTE — Progress Notes (Addendum)
[address]       [place] [address]             [phone]        CARDIOTHORACIC SURGERY PROGRESS NOTE   R1 Day Post-Op Procedure(s) (LRB): TRANSCATHETER AORTIC VALVE REPLACEMENT, TRANSFEMORAL (N/A) TRANSESOPHAGEAL ECHOCARDIOGRAM (TEE) (N/A)  Subjective: Feels well.  Only complaint is that she couldn't sleep.  Reports that breathing already seems better than PTA  Objective: Vital signs: BP Readings from Last 1 Encounters:  10/15/16 121/88   Pulse Readings from Last 1 Encounters:  10/15/16 64   Resp Readings from Last 1 Encounters:  10/15/16 (!) 23   Temp Readings from Last 1 Encounters:  10/15/16 97.8 F (36.6 C) (Oral)    Hemodynamics:    Physical Exam:  Rhythm:   sinus  Breath sounds: clear  Heart sounds:  RRR  Incisions:  Both groins okay  Abdomen:  Soft, non-distended, non-tender  Extremities:  Warm, well-perfused    Intake/Output from previous day: 12/19 0701 - 12/20 0700 In: 3281.8 [P.O.:240; I.V.:2741.8; IV Piggyback:300] Out: 2280 [Urine:2270; Blood:10] Intake/Output this shift: Total I/O In: 51.6 [I.V.:1.6; IV Piggyback:50] Out: 60 [Urine:60]  Lab Results:  CBC: Recent Labs  10/14/16 1035 10/14/16 1036 10/15/16 0400  WBC 5.9  --  7.9  HGB 8.3* 8.2* 8.4*  HCT 25.9* 24.0* 26.6*  PLT 185  --  201    BMET:  Recent Labs  10/14/16 0943 10/14/16 1036 10/15/16 0400  NA 137 138 138  K 4.1 4.1 4.6  CL 96*  --  101  CO2  --   --  30  GLUCOSE 196* 191* 118*  BUN 32*  --  23*  CREATININE 1.30*  --  1.24*  CALCIUM  --   --  8.8*     PT/INR:   Recent Labs  10/14/16 1035  LABPROT 14.0  INR 1.07    CBG (last 3)   Recent Labs  10/15/16 0353 10/15/16 0457 10/15/16 0651  GLUCAP 124* 112* 88    ABG    Component Value Date/Time   PHART 7.385 10/14/2016 1358   PCO2ART 51.9 (H) 10/14/2016 1358   PO2ART 79.0 (L) 10/14/2016 1358   HCO3 31.4 (H) 10/14/2016 1358   TCO2 33 10/14/2016 1358   O2SAT 96.0  10/14/2016 1358    CXR: PORTABLE CHEST 1 VIEW  COMPARISON:  October 14, 2016  FINDINGS: Endotracheal tube has been removed. Central catheter tip is in superior vena cava. No pneumothorax.  There is a right pleural effusion with atelectatic change in the right base. The lungs elsewhere are clear. Heart is enlarged with pulmonary vascularity within normal limits. There is an aortic valve replacement. There is atherosclerotic calcification aorta. There is a total shoulder replacement on the right. There is degenerative change in the left shoulder.  IMPRESSION: Small right pleural effusion with right base atelectasis. Lungs elsewhere clear. Stable cardiomegaly. There is aortic atherosclerosis. No pneumothorax.   Electronically Signed   By: Lowella Grip III M.D.   On: 10/15/2016 08:47   EKG: NSR w/out acute ischemic changes but new LBBB and 1st degree AV block    Assessment/Plan: S/P Procedure(s) (LRB): TRANSCATHETER AORTIC VALVE REPLACEMENT, TRANSFEMORAL (N/A) TRANSESOPHAGEAL ECHOCARDIOGRAM (TEE) (N/A)  Overall stable POD1 TAVR Maintaining NSR w/ new 1st degree AV block and LBBB Breathing comfortably w/ O2 sats 95-97% on 2 L/min via [place] Hypertension Type II diabetes mellitus, excellent glycemic control on insulin drip Paroxysmal atrial fibrillation maintaining  NSR on amiodarone  Iron-deficient anemia w/ history of GI bleeding, Hgb stable postop Obesity Chronic respiratory failure   Mobilize  Gentle diuresis  Resume pre-op meds for hypertension  Low dose ASA and Eliquis  Watch Hgb  Add levemir insulin and transition off drip  Resume home meds for DMII once eating  Routine f/u ECHO  Transfer floor  Anticipate likely d/c home tomorrow    I spent in excess of 15 minutes during the conduct of this hospital encounter and >50% of this time involved direct face-to-face encounter with the patient for counseling and/or coordination of their  care.   Rexene Alberts, MD 10/15/2016 9:00 AM

## 2016-10-16 DIAGNOSIS — I480 Paroxysmal atrial fibrillation: Secondary | ICD-10-CM

## 2016-10-16 LAB — CBC
HCT: 26.4 % — ABNORMAL LOW (ref 36.0–46.0)
Hemoglobin: 8.4 g/dL — ABNORMAL LOW (ref 12.0–15.0)
MCH: 27.5 pg (ref 26.0–34.0)
MCHC: 31.8 g/dL (ref 30.0–36.0)
MCV: 86.6 fL (ref 78.0–100.0)
Platelets: 187 10*3/uL (ref 150–400)
RBC: 3.05 MIL/uL — ABNORMAL LOW (ref 3.87–5.11)
RDW: 16.5 % — ABNORMAL HIGH (ref 11.5–15.5)
WBC: 6.8 10*3/uL (ref 4.0–10.5)

## 2016-10-16 LAB — BASIC METABOLIC PANEL
Anion gap: 7 (ref 5–15)
BUN: 29 mg/dL — ABNORMAL HIGH (ref 6–20)
CO2: 32 mmol/L (ref 22–32)
Calcium: 8.7 mg/dL — ABNORMAL LOW (ref 8.9–10.3)
Chloride: 101 mmol/L (ref 101–111)
Creatinine, Ser: 1.36 mg/dL — ABNORMAL HIGH (ref 0.44–1.00)
GFR calc Af Amer: 42 mL/min — ABNORMAL LOW (ref >60)
GFR calc non Af Amer: 36 mL/min — ABNORMAL LOW (ref >60)
Glucose, Bld: 99 mg/dL (ref 65–99)
Potassium: 4.5 mmol/L (ref 3.5–5.1)
Sodium: 140 mmol/L (ref 135–145)

## 2016-10-16 LAB — GLUCOSE, CAPILLARY
Glucose-Capillary: 111 mg/dL — ABNORMAL HIGH (ref 65–99)
Glucose-Capillary: 115 mg/dL — ABNORMAL HIGH (ref 65–99)
Glucose-Capillary: 143 mg/dL — ABNORMAL HIGH (ref 65–99)
Glucose-Capillary: 88 mg/dL (ref 65–99)

## 2016-10-16 MED ORDER — APIXABAN 5 MG PO TABS: 5.0000 mg | ORAL_TABLET | Freq: Two times a day (BID) | ORAL | 3 refills | Status: DC

## 2016-10-16 MED ORDER — LISINOPRIL 10 MG PO TABS: 10.0000 mg | ORAL_TABLET | Freq: Every day | ORAL | 3 refills | Status: DC

## 2016-10-16 NOTE — Progress Notes (Signed)
Discharge instructions given. Pt verbalized understanding and all questions were answered.

## 2016-10-16 NOTE — Progress Notes (Signed)
CARDIAC REHAB PHASE I   PRE:  Rate/Rhythm: 59 SB    BP: sitting 147/41    SaO2: 97 2L, 84 RA after bathroom  MODE:  Ambulation: 350 ft   POST:  Rate/Rhythm: 79 SR    BP: sitting 97/72     SaO2: 94 2L  Pt walked on 2L O2, supervision assist. No AD needed however her knees were really hurting toward the end and she needed to rest x2. Discussed rollator but pt declined. Still needs O2, has set up at home. Encouraged her to use IS, walk as tolerated, watch incisions, eat low sodium diet and DM. Will refer to CRPII G'sO. She is looking forward to program. Jen[place], ACSM 10/16/2016 11:28 AM

## 2016-10-16 NOTE — Progress Notes (Signed)
    Subjective:  Feels well. Wants to go home. No CP or dyspnea. Going to her daughter's at discharge.   Objective:  Vital Signs in the last 24 hours: Temp:  [97.7 F (36.5 C)-98.5 F (36.9 C)] 97.8 F (36.6 C) (12/21 0430) Pulse Rate:  [56-66] 65 (12/21 0430) Resp:  [14-22] 20 (12/21 0430) BP: (127-169)/(45-78) 131/45 (12/21 0430) SpO2:  [97 %-100 %] 97 % (12/21 0430) Weight:  [224 lb (101.6 kg)] 224 lb (101.6 kg) (12/21 0430)  Intake/Output from previous day: 12/20 0701 - 12/21 0700 In: 372.1 [P.O.:240; I.V.:32.1; IV Piggyback:100] Out: 75 [Urine:970]  Physical Exam: Pt is alert and oriented, NAD HEENT: normal Neck: JVP - normal Lungs: CTA bilaterally CV: RRR without murmur or gallop Abd: soft, NT, obese Ext: no C/C/E Skin: warm/dry no rash   Lab Results:  Recent Labs  10/15/16 0400 10/16/16 0428  WBC 7.9 6.8  HGB 8.4* 8.4*  PLT 201 187    Recent Labs  10/15/16 0400 10/16/16 0428  NA 138 140  K 4.6 4.5  CL 101 101  CO2 30 32  GLUCOSE 118* 99  BUN 23* 29*  CREATININE 1.24* 1.36*   No results for input(s): TROPONINI in the last 72 hours.  Invalid input(s): CK, MB  Cardiac Studies: 2D Echo pending  Tele: Sinus rhythm  Assessment/Plan:  1. Acute on chronic diastolic CHF, NYHA III - appears euvolemic 2. Severe aortic stenosis s/p TAVR POD #2 3. Anemia, chronic - stable HgB 8.4 mg/dL 4. Type II DM - controlled - anticipate resume oral hypoglycemics at discharge 5. HTN - diltiazem changed to lisinopril to avoid further AV nodal blockade in setting new LBBB 6. Paroxysmal atrial fibrillation - maintaining sinus rhythm on amiodarone. Eliquis started this am.  7. LBBB - new - stable rhythm on telemetry  Dispo: pt appears clinically stable and ready for discharge. Discussed medication changes with her:  Diltiazem stopped  Start lisinopril 10 mg (new med)  Stop ASA  Start Eliquis 5 mg BID (new med)  Await 2D echo and possible discharge later  today pending review of Dr Rich Number, M.D. 10/16/2016, 7:56 AM

## 2016-10-16 NOTE — Care Management Note (Signed)
Case Management Note Marvetta Gibbons RN, BSN Unit 2W-Case Manager [phone]  Patient Details  Name: Patricia [name] MRN: [id number] Date of Birth: Mar 18, 1936  Subjective/Objective:  Pt s/p TAVR                  Action/Plan: PTA pt lived at home-, has home 02 with [facility]- plan to return home- no CM needs noted  Expected Discharge Date:    10/16/16              Expected Discharge Plan:  Home/Self Care  In-House Referral:     Discharge planning Services  CM Consult  Post Acute Care Choice:  NA Choice offered to:     DME Arranged:    DME Agency:     HH Arranged:  NA HH Agency:     Status of Service:  Completed, signed off  If discussed at [place] of Stay Meetings, dates discussed:    Additional Comments:  Dawayne Patricia, RN 10/16/2016, 11:19 AM

## 2016-10-16 NOTE — Progress Notes (Signed)
[address]       [name] [id number]             [phone]        2 Days Post-Op Procedure(s) (LRB): TRANSCATHETER AORTIC VALVE REPLACEMENT, TRANSFEMORAL (N/A) TRANSESOPHAGEAL ECHOCARDIOGRAM (TEE) (N/A)  Subjective: Patient sitting in chair. She no specific complaints. She hopes to go home.  Objective: Vital signs in last 24 hours: Temp:  [97.7 F (36.5 C)-98.5 F (36.9 C)] 97.8 F (36.6 C) (12/21 0430) Pulse Rate:  [56-66] 65 (12/21 0430) Cardiac Rhythm: Normal sinus rhythm (12/20 1950) Resp:  [14-22] 20 (12/21 0430) BP: (127-169)/(45-78) 131/45 (12/21 0430) SpO2:  [97 %-100 %] 97 % (12/21 0430) Weight:  [224 lb (101.6 kg)] 224 lb (101.6 kg) (12/21 0430)   Current Weight  10/16/16 224 lb (101.6 kg)      Intake/Output from previous day: 12/20 0701 - 12/21 0700 In: 372.1 [P.O.:240; I.V.:32.1; IV Piggyback:100] Out: 16 [Urine:970]   Physical Exam:  Cardiovascular: RRR, no murmur Pulmonary: Clear to auscultation bilaterally Extremities: No lower extremity edema. Wounds: Clean and dry.  No erythema or signs of infection.  Lab Results: CBC: Recent Labs  10/15/16 0400 10/16/16 0428  WBC 7.9 6.8  HGB 8.4* 8.4*  HCT 26.6* 26.4*  PLT 201 187   BMET:  Recent Labs  10/15/16 0400 10/16/16 0428  NA 138 140  K 4.6 4.5  CL 101 101  CO2 30 32  GLUCOSE 118* 99  BUN 23* 29*  CREATININE 1.24* 1.36*  CALCIUM 8.8* 8.7*    PT/INR:  Lab Results  Component Value Date   INR 1.07 10/14/2016   INR 0.95 10/10/2016   INR 1.0 08/27/2016   ABG:  INR: Will add last result for INR, ABG once components are confirmed Will add last 4 CBG results once components are confirmed  Assessment/Plan:  1. CV - SR in the 60's. On Amiodarone 200 mg daily, Lisinopril 10 mg daily, and Eliquis 5 mg bid. 2.  Pulmonary - On 2 liters of oxygen via [place] this am. She was on oxygen prior to admission. 3.DM-CBGs 126/122/111. On Metformin 500 mg bid.  4.   Acute blood loss anemia - H and H stable at 8.4 and 26.4. Continue Ferrous sulfated.  5. Creatinine slightly increased from 1.24 to 1.36 creatinine upon admission 1.22). She is on low dose Lisinopril as well as Metformin for DM 6. Will  discharge later today-after echo done  Patricia [name] MPA-C 10/16/2016,8:05 AM

## 2016-10-17 ENCOUNTER — Telehealth: Payer: Self-pay | Admitting: *Deleted

## 2016-10-17 NOTE — Telephone Encounter (Signed)
Per call from patient, the eliquis requires a PA. Patient can be reached at [phone]. Thanks, MI

## 2016-10-17 NOTE — Telephone Encounter (Signed)
Spoke with patient about this PA. Advised it could take a while, so we have provided samples. Eliquis 24m 2 boxes given.

## 2016-10-21 ENCOUNTER — Telehealth: Payer: Self-pay

## 2016-10-21 ENCOUNTER — Other Ambulatory Visit: Payer: Self-pay | Admitting: *Deleted

## 2016-10-21 LAB — TYPE AND SCREEN
Blood Product Expiration Date: 201712282359
Blood Product Expiration Date: 201801032359
ISSUE DATE / TIME: 201712190742
ISSUE DATE / TIME: 201712242316
Unit Type and Rh: 6200
Unit Type and Rh: 6200

## 2016-10-21 NOTE — Telephone Encounter (Signed)
Prior auth for Eliquis 59m submitted to [facility].

## 2016-10-22 ENCOUNTER — Telehealth: Payer: Self-pay

## 2016-10-22 NOTE — Telephone Encounter (Signed)
Eliquis approved by [facility]. [id number] and is valid through 10/26/2017.

## 2016-10-24 ENCOUNTER — Encounter: Payer: Self-pay | Admitting: Pulmonary Disease

## 2016-10-24 ENCOUNTER — Ambulatory Visit (INDEPENDENT_AMBULATORY_CARE_PROVIDER_SITE_OTHER): Payer: Medicare Other | Admitting: Pulmonary Disease

## 2016-10-24 DIAGNOSIS — G4710 Hypersomnia, unspecified: Secondary | ICD-10-CM | POA: Diagnosis not present

## 2016-10-24 DIAGNOSIS — J45909 Unspecified asthma, uncomplicated: Secondary | ICD-10-CM | POA: Diagnosis not present

## 2016-10-24 DIAGNOSIS — R0902 Hypoxemia: Secondary | ICD-10-CM

## 2016-10-24 DIAGNOSIS — E662 Morbid (severe) obesity with alveolar hypoventilation: Secondary | ICD-10-CM | POA: Diagnosis not present

## 2016-10-24 DIAGNOSIS — E6609 Other obesity due to excess calories: Secondary | ICD-10-CM

## 2016-10-24 DIAGNOSIS — Z6838 Body mass index (BMI) 38.0-38.9, adult: Secondary | ICD-10-CM

## 2016-10-24 DIAGNOSIS — IMO0001 Reserved for inherently not codable concepts without codable children: Secondary | ICD-10-CM

## 2016-10-24 MED ORDER — ALBUTEROL SULFATE (2.5 MG/3ML) 0.083% IN NEBU: 2.5000 mg | INHALATION_SOLUTION | RESPIRATORY_TRACT | 3 refills | Status: DC | PRN

## 2016-10-24 NOTE — Assessment & Plan Note (Signed)
Pt with snoring, witnessed apneas, hypersomnia, fatigue.  She wanted to hold off on a sleep study. Pt with [facility].

## 2016-10-24 NOTE — Addendum Note (Signed)
Addended by: [name] on: 10/24/2016 03:07 PM   Modules accepted: Orders

## 2016-10-24 NOTE — Assessment & Plan Note (Signed)
ABG Co2 50s (09/2016) Needs a sleep study but she wanted to hold off.

## 2016-10-24 NOTE — Progress Notes (Signed)
Subjective:    Patient ID: Patricia [name], female    DOB: 1936-08-10, 80 y.o.   MRN: [id number]  HPI    This is the case of Patricia [name], 80 y.o. Female, who Is being seen today for shortness of breath.  As you very well know, patient  is a 80 year old obese white female with history of aortic stenosis, hypertension, atrial fibrillation, coronary artery disease, chronic diastolic congestive heart failure, acute on chronic hypoxic respiratory failure, hyperlipidemia, and GI bleeding with anemia.  She recently had a TAVR a week ago.   Pt had a 39 PY smoking history, quit when she was 80 yrs old. Pt was diagnosed with adult asthma. Triggers: strong odors, pollen, change in weather. Has been on advair 100/50 1P BID for a while. Also on alb as needed.   She was started on O2 2L 24/7 during Thanksgiving admission for SOB.   Pt's SOB is some better after having TAVR. She continues to have episodic SOB.   Pt has snoring, occasional gasping or choking. Has witnessed apneas.  Has hypersomnia, mild. Naps daily.  Hypersomnia affects her fxnality.     Review of Systems  Constitutional: Positive for unexpected weight change.  HENT: Negative.   Eyes: Positive for redness and itching.  Respiratory: Positive for chest tightness, shortness of breath and wheezing.   Cardiovascular: Positive for leg swelling.  Gastrointestinal: Negative.   Endocrine: Negative.   Genitourinary: Negative.   Musculoskeletal: Negative.   Skin: Negative.   Allergic/Immunologic: Positive for environmental allergies.  Neurological: Positive for dizziness.  Hematological: Bruises/bleeds easily.  Psychiatric/Behavioral: Negative.     Past Medical History:  Diagnosis Date  . Allergy   . Anemia, unspecified   . Arthritis   . Asthma   . Atrial fibrillation status post cardioversion [facility]) 09/2015   s/p TEE/DCCV>>SR on amio  . Benign neoplasm of colon   . Carpal tunnel syndrome   . Cellulitis and abscess of  finger, unspecified   . Cerumen impaction   . Cervicalgia   . CHF (congestive heart failure) ([place])   . Chronic airway obstruction, not elsewhere classified   . Chronic anticoagulation 09/2015   Xarelto for afib, CHADS2VASC=5  . Diarrhea   . Diffuse cystic mastopathy   . Dysrhythmia   . Edema   . Encounter for long-term (current) use of other medications   . GERD (gastroesophageal reflux disease)   . Heart murmur   . Lumbago   . Neck pain 05/23/2015  . Obesity, unspecified   . Other and unspecified hyperlipidemia   . Other psoriasis   . Pain in joint, lower leg   . PONV (postoperative nausea and vomiting)   . Primary localized osteoarthrosis of right shoulder 12/18/2015  . Routine gynecological examination   . 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 Scoliosis (and kyphoscoliosis), idiopathic   . Severe aortic stenosis   . Shortness of breath dyspnea    with exertion  . Type II or unspecified type diabetes mellitus without mention of complication, uncontrolled   . Unspecified essential hypertension   . Unspecified hemorrhoids without mention of complication   . Unspecified hypothyroidism   . Urge incontinence    (-) CA, DVT  Family History  Problem Relation Age of Onset  . Diabetes Father   . Stroke Father   . Heart disease Father   . Liver cancer Sister   .  Heart disease Brother   . Asthma Sister   . Arthritis Sister      Past Surgical History:  Procedure Laterality Date  . ABDOMINAL HYSTERECTOMY  1987   complete  . BREAST SURGERY     right breast 3 surgeries [phone]  . CARDIAC CATHETERIZATION N/A 09/02/2016   Procedure: Right/Left Heart Cath and Coronary Angiography;  Surgeon: [name] M Martinique, MD;  Location: [place] [facility];  Service: Cardiovascular;  Laterality: N/A;  . CARDIOVERSION N/A 10/19/2015   Procedure: CARDIOVERSION;   Surgeon: Lelon Perla, MD;  Location: [place];  Service: Cardiovascular;  Laterality: N/A;  . [place]  . COLON SURGERY     2004,2007,2013.  3 times colon surgieres  . EXCISE LE MANDIBULAR LYMPH NODE T     DR C. NEWMAN  . FOOT SURGERY  1989  . LEFT SHOULDER ARTHROSCOPY    . 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 NEUROPLASTY / TRANSPOSITION MEDIAN NERVE AT CARPAL TUNNEL BILATERAL  05/2003  . TEE WITHOUT CARDIOVERSION N/A 10/19/2015   Procedure: Transesophageal Echocardiogram (TEE) ;  Surgeon: Lelon Perla, MD;  Location[facility];  Service: Cardiovascular;  Laterality: N/A;  . TEE WITHOUT CARDIOVERSION N/A 10/14/2016   Procedure: TRANSESOPHAGEAL ECHOCARDIOGRAM (TEE);  Surgeon: Sherren Mocha, MD;  Location: [place];  Service: Open Heart Surgery;  Laterality: N/A;  . TOTAL SHOULDER ARTHROPLASTY Right 12/18/2015   Procedure: TOTAL SHOULDER ARTHROPLASTY;  Surgeon: [name], MD;  Location: [place];  Service: Orthopedics;  Laterality: Right;  . TRANSCATHETER AORTIC VALVE REPLACEMENT, TRANSFEMORAL N/A 10/14/2016   Procedure: TRANSCATHETER AORTIC VALVE REPLACEMENT, TRANSFEMORAL;  Surgeon: Sherren Mocha, MD;  Location: [facility];  Service: Open Heart Surgery;  Laterality: N/A;  . TRIGGER FINGER RELEASE Left   . VAGINAL CYST REMOVED  1967    Social History   Social History  . Marital status: Divorced    Spouse name: N/A  . Number of children: 1  . Years of education: N/A   Occupational History  . Retired Market researcher for [employer] History Main Topics  . Smoking status: Former Smoker    Packs/day: 1.00    Years: 40.00    Quit date: 09/07/1989  . Smokeless tobacco: Never Used  . Alcohol use No  . Drug use: No  . Sexual activity: No   Other Topics Concern  . Not on file   Social History Narrative   Her daughter & 3 grandchildren live in the area.     Lives in [place]. Worked as a Freight forwarder.   Allergies  Allergen Reactions  .  Codeine Other (See Comments)    "spaces her out, dizziness, can't walk well"  . Vesicare [Solifenacin] Itching     Outpatient Medications Prior to Visit  Medication Sig Dispense Refill  . acetaminophen (TYLENOL) 500 MG tablet Take 500 mg by mouth every 6 (six) hours as needed for mild pain.    Marland Kitchen ADVAIR DISKUS 100-50 MCG/DOSE AEPB Inhale 1 inhalation two  times daily for breathing 180 each 3  . albuterol (PROVENTIL HFA;VENTOLIN HFA) 108 (90 Base) MCG/ACT inhaler Inhale 2 puffs into the lungs every 6 (six) hours as needed for wheezing or shortness of breath.    Marland Kitchen amiodarone (PACERONE) 200 MG tablet Take one tablet daily by mouth 90 tablet 3  . apixaban (ELIQUIS) 5 MG TABS tablet Take 1 tablet (5 mg total) by mouth 2 (two) times daily. 60 tablet 3  . cetirizine (  ZYRTEC) 10 MG tablet Take 10 mg by mouth daily as needed for allergies.     . chlorhexidine (PERIDEX) 0.12 % solution Rinse with 15 mls twice daily for 30 seconds. Use after breakfast and at bedtime. Spit out excess. Do not swallow. 480 mL prn  . cholecalciferol (VITAMIN D) 1000 units tablet One daily for vitamin D supplement (Patient taking differently: Take 1,000 Units by mouth at bedtime. ) 100 tablet 4  . COMFORT LANCETS MISC Use daily to get blood for glucometer 100 each 3  . doxazosin (CARDURA) 4 MG tablet TAKE 1 TABLET BY MOUTH ONCE DAILY TO HELP CONTROL BLOOD PRESSURE 90 tablet 0  . ferrous sulfate 325 (65 FE) MG EC tablet Take 1 tablet (325 mg total) by mouth daily with breakfast. 30 tablet 6  . furosemide (LASIX) 40 MG tablet Take 1 tablet (40 mg total) by mouth daily. 30 tablet 0  . glimepiride (AMARYL) 2 MG tablet Take 1 tablet by mouth at  breakfast to control  Glucose 90 tablet 0  . glucose blood (BAYER CONTOUR TEST) test strip Use daily too check glucose 100 each 4  . Ketotifen Fumarate (ALLERGY EYE DROPS OP) Apply 1 drop to eye daily as needed (allergies).    Marland Kitchen levothyroxine (SYNTHROID, LEVOTHROID) 50 MCG tablet Take 1  tablet by mouth  daily for thyroid  supplement (Patient taking differently: Take 1 tablet by mouth at night for thyroid  supplement) 90 tablet 1  . lisinopril (PRINIVIL,ZESTRIL) 10 MG tablet Take 1 tablet (10 mg total) by mouth daily. 30 tablet 3  . metFORMIN (GLUCOPHAGE) 500 MG tablet Take 500 mg by mouth 2 (two) times daily with a meal.    . pantoprazole (PROTONIX) 40 MG tablet Take 1 tablet (40 mg total) by mouth daily. 90 tablet 1  . potassium gluconate 595 (99 K) MG TABS tablet Take 595 mg by mouth daily. TAKES ONLY WHEN USING FUROSEMIDE    . pravastatin (PRAVACHOL) 20 MG tablet TAKE 1 TABLET BY MOUTH  DAILY (Patient taking differently: Take 20 mg by mouth every evening. ) 90 tablet 1  . triamcinolone (KENALOG) 0.025 % cream Apply 1 application topically daily as needed (psoriasis).      No facility-administered medications prior to visit.    No orders of the defined types were placed in this encounter.        Objective:   Physical Exam Vitals:  Vitals:   10/24/16 1355  BP: 140/70  Pulse: 72  SpO2: 93%  Weight: 213 lb 6.4 oz (96.8 kg)  Height: 5' 2.5" (1.588 m)    Constitutional/General:  Pleasant, well-nourished, well-developed, not in any distress,  Comfortably seating.  Well kempt  Body mass index is 38.41 kg/m. Wt Readings from Last 3 Encounters:  10/24/16 213 lb 6.4 oz (96.8 kg)  10/16/16 224 lb (101.6 kg)  10/10/16 220 lb 12.8 oz (100.2 kg)     HEENT: Pupils equal and reactive to light and accommodation. Anicteric sclerae. Normal nasal mucosa.   No oral  lesions,  mouth clear,  oropharynx clear, no postnasal drip. (-) Oral thrush. No dental caries.  Airway - Mallampati class IV  Neck: No masses. Midline trachea. No JVD, (-) LAD. (-) bruits appreciated.  Respiratory/Chest: Grossly normal chest. (-) deformity. (-) Accessory muscle use.  Symmetric expansion. (-) Tenderness on palpation.  Resonant on percussion.  Diminished BS on both lower lung zones. (-)  wheezing, crackles, rhonchi (-) egophony  Cardiovascular: Regular rate and  rhythm, heart sounds  normal, no murmur or gallops, no peripheral edema  Gastrointestinal:  Normal bowel sounds. Soft, non-tender. No hepatosplenomegaly.  (-) masses.   Musculoskeletal:  Normal muscle tone. Normal gait.   Extremities: Grossly normal. (-) clubbing, cyanosis.  (-) edema  Skin: (-) rash,lesions seen.   Neurological/Psychiatric : alert, oriented to time, place, person. Normal mood and affect           Assessment & Plan:  Hypoxemia Pt is here because of hypoxemia. She has significant comorbidities. She recently had a TAVR on Oct 14, 2016. She was started and oxygen, 2 L 24/7 when she was admitted during Thanksgiving for acute diastolic heart failure and pulmonary edema related to severe aortic stenosis.  Patient wants to find out whether she can be off oxygen as she does not want to be on it indefinitely.  Since having her TAVR last dec 19, her dyspnea is some better. She has a pulse oximeter at home and she checks her oxygen saturation on room air and is around 90%. Today, I took off the oxygen, her O2 sats ration hovered around 87-90 percent sitting and talking.  Her hypoxemia is multifactorial. 1. Related to her cardiac issues. She had severe aortic stenosis for which she had TAVR in 09/2016.  She has diastolic heart failure. 2. Obesity hypoventilation syndrome. She had an ABG prior to TAVR and her CO2 was in the 49-50 range.  3. Likely with obstructive sleep apnea. 4. She had PFT in 09/2016 and her  FEV1 was  1.36 or 74% predicted. Significant bronchodilator response. She has asthma which is probably contributing  into it but it's just mild and is controlled.  Plan: 1. I told her most likely she'll be on oxygen for a while. During my interview with her, as mentioned, I took off the oxygen and her O2 saturation hovered around 86-90 percent on room air. I suggest she needs to be in  oxygen 2 L at rest and with exertion to keep her O2 sats ration more than 88%. She can check it as well. She did not sound too happy knowing the fact that she will most likely need to be on oxygen indefinitely. 2. Mentioned about obesity hypoventilation syndrome and my concern about obstructive sleep apnea. She wanted to hold off on sleep study. 3. Rx asthma.  Asthma PFT (09/2016)  FEV1  1.36  74%, DLCO 59. Significant BD response. Seems stable. Advised Advair 100/50, one puff twice a day. Advised her to rinse her mouth each use. Pt recently used her daughters neb machine. We will order her nebulizer with albuterol every 4 hours via nebulizer when necessary for dyspnea.  Hypersomnia Pt with snoring, witnessed apneas, hypersomnia, fatigue.  She wanted to hold off on a sleep study. Pt with [facility].   Obesity hypoventilation syndrome ([facility]) ABG Co2 50s (09/2016) Needs a sleep study but she wanted to hold off.  Obesity Weight reduction      Patient will follow up with me in 3 mos.     Monica Becton, MD 10/24/2016   2:52 PM [facility] [place] Pager: [phone] Office: [phone], Fax: [phone]

## 2016-10-24 NOTE — Assessment & Plan Note (Signed)
Pt is here because of hypoxemia. She has significant comorbidities. She recently had a TAVR on Oct 14, 2016. She was started and oxygen, 2 L 24/7 when she was admitted during Thanksgiving for acute diastolic heart failure and pulmonary edema related to severe aortic stenosis.  Patient wants to find out whether she can be off oxygen as she does not want to be on it indefinitely.  Since having her TAVR last dec 19, her dyspnea is some better. She has a pulse oximeter at home and she checks her oxygen saturation on room air and is around 90%. Today, I took off the oxygen, her O2 sats ration hovered around 87-90 percent sitting and talking.  Her hypoxemia is multifactorial. 1. Related to her cardiac issues. She had severe aortic stenosis for which she had TAVR in 09/2016.  She has diastolic heart failure. 2. Obesity hypoventilation syndrome. She had an ABG prior to TAVR and her CO2 was in the 49-50 range.  3. Likely with obstructive sleep apnea. 4. She had PFT in 09/2016 and her  FEV1 was  1.36 or 74% predicted. Significant bronchodilator response. She has asthma which is probably contributing  into it but it's just mild and is controlled.  Plan: 1. I told her most likely she'll be on oxygen for a while. During my interview with her, as mentioned, I took off the oxygen and her O2 saturation hovered around 86-90 percent on room air. I suggest she needs to be in oxygen 2 L at rest and with exertion to keep her O2 sats ration more than 88%. She can check it as well. She did not sound too happy knowing the fact that she will most likely need to be on oxygen indefinitely. 2. Mentioned about obesity hypoventilation syndrome and my concern about obstructive sleep apnea. She wanted to hold off on sleep study. 3. Rx asthma.

## 2016-10-24 NOTE — Patient Instructions (Signed)
It was a pleasure taking care of you today!  You are diagnosed with Asthma.   Asthma is a chronic disease that affects the airways of your lungs.When you have asthma, your airways become swollen. The swelling causes the airways to make thick, sticky secretions called mucus.   Asthma also causes the muscles in and around your airways to get very tight.  This swelling, mucus, and tight muscles can make your airways narrower than normal and it becomes very hard for you to get air into and out of your lungs.   Sometimes, when you have a lung infection, this can make your breathing worse, and will cause you to  have an asthma flare-up. Please call your primary care doctor or the office if you are having an asthma flare-up.   Smoking makes asthma worse.   Make sure you use your medications for asthma -- Maintenance medications : Advair 100/50 1 puff 2x/day.   Rescue medications: Albuterol 2 puffs every 4 hours as needed for shortness of breath.  We will prescribe you albuterol nebulizer as well every 4 hours as needed.  Please rinse your mouth each time you use your maintenance medication.   Please call the office if you are having issues with your medications  Return to clinic in 3 months with Dr. Corrie Dandy

## 2016-10-24 NOTE — Assessment & Plan Note (Signed)
Weight reduction 

## 2016-10-24 NOTE — Assessment & Plan Note (Signed)
PFT (09/2016)  FEV1  1.36  74%, DLCO 59. Significant BD response. Seems stable. Advised Advair 100/50, one puff twice a day. Advised her to rinse her mouth each use. Pt recently used her daughters neb machine. We will order her nebulizer with albuterol every 4 hours via nebulizer when necessary for dyspnea.

## 2016-10-28 ENCOUNTER — Telehealth: Payer: Self-pay | Admitting: Pulmonary Disease

## 2016-10-28 ENCOUNTER — Telehealth: Payer: Self-pay | Admitting: Internal Medicine

## 2016-10-28 ENCOUNTER — Other Ambulatory Visit: Payer: Self-pay | Admitting: Pulmonary Disease

## 2016-10-28 MED ORDER — ALBUTEROL SULFATE (2.5 MG/3ML) 0.083% IN NEBU
2.5000 mg | INHALATION_SOLUTION | RESPIRATORY_TRACT | 3 refills | Status: DC
Start: 2016-10-28 — End: 2016-10-28

## 2016-10-28 MED ORDER — ALBUTEROL SULFATE (2.5 MG/3ML) 0.083% IN NEBU: 2.5000 mg | INHALATION_SOLUTION | RESPIRATORY_TRACT | 3 refills | Status: DC

## 2016-10-28 MED ORDER — ALBUTEROL SULFATE HFA 108 (90 BASE) MCG/ACT IN AERS: 2.0000 | INHALATION_SPRAY | Freq: Four times a day (QID) | RESPIRATORY_TRACT | 11 refills | Status: DC | PRN

## 2016-10-28 NOTE — Telephone Encounter (Signed)
Error.Stanley A Dalton ° °

## 2016-10-28 NOTE — Telephone Encounter (Signed)
Called and spoke with pt and she is aware that the refill of the albuterol hfa has been sent to her pharmacy. Nothing further is needed.

## 2016-10-28 NOTE — Telephone Encounter (Signed)
Called and spoke with pharmacy and they stated that that since prn is in the rx for the albuterol that medicare will not cover this.  Medication rx has been fixed and nothing further is needed.

## 2016-11-19 ENCOUNTER — Ambulatory Visit: Payer: Medicare Other | Admitting: Internal Medicine

## 2016-11-24 ENCOUNTER — Other Ambulatory Visit: Payer: Self-pay | Admitting: Internal Medicine

## 2016-11-24 ENCOUNTER — Ambulatory Visit (INDEPENDENT_AMBULATORY_CARE_PROVIDER_SITE_OTHER): Payer: Medicare Other | Admitting: Cardiovascular Disease

## 2016-11-24 ENCOUNTER — Encounter: Payer: Self-pay | Admitting: Cardiovascular Disease

## 2016-11-24 ENCOUNTER — Other Ambulatory Visit: Payer: Self-pay

## 2016-11-24 ENCOUNTER — Ambulatory Visit (HOSPITAL_COMMUNITY): Payer: Medicare Other | Attending: Cardiology

## 2016-11-24 VITALS — BP 158/56 | HR 76 | Ht 63.0 in | Wt 213.1 lb

## 2016-11-24 DIAGNOSIS — I350 Nonrheumatic aortic (valve) stenosis: Secondary | ICD-10-CM | POA: Diagnosis not present

## 2016-11-24 DIAGNOSIS — I340 Nonrheumatic mitral (valve) insufficiency: Secondary | ICD-10-CM | POA: Insufficient documentation

## 2016-11-24 DIAGNOSIS — Z952 Presence of prosthetic heart valve: Secondary | ICD-10-CM | POA: Diagnosis not present

## 2016-11-24 DIAGNOSIS — E119 Type 2 diabetes mellitus without complications: Secondary | ICD-10-CM

## 2016-11-24 DIAGNOSIS — R9439 Abnormal result of other cardiovascular function study: Secondary | ICD-10-CM | POA: Diagnosis not present

## 2016-11-24 DIAGNOSIS — I501 Left ventricular failure: Secondary | ICD-10-CM | POA: Diagnosis not present

## 2016-11-24 MED ORDER — ASPIRIN EC 81 MG PO TBEC: 81.0000 mg | DELAYED_RELEASE_TABLET | Freq: Every day | ORAL | Status: DC

## 2016-11-24 NOTE — Patient Instructions (Signed)
Medication Instructions:  Your physician has recommended you make the following change in your medication:  1. START Aspirin 24m take one tablet by mouth daily  Labwork: No new orders.   Testing/Procedures: Your physician has requested that you have an echocardiogram in 1 YEAR. Echocardiography is a painless test that uses sound waves to create images of your heart. It provides your doctor with information about the size and shape of your heart and how well your heart's chambers and valves are working. This procedure takes approximately one hour. There are no restrictions for this procedure.  Follow-Up: Your physician recommends that you schedule a follow-up appointment in: 3 MONTHS with Dr JMartinique Your physician wants you to follow-up in: 1 YEAR with Dr CBurt Knack  You will receive a reminder letter in the mail two months in advance. If you don't receive a letter, please call our office to schedule the follow-up appointment.   Any Other Special Instructions Will Be Listed Below (If Applicable).     If you need a refill on your cardiac medications before your next appointment, please call your pharmacy.

## 2016-11-24 NOTE — Progress Notes (Signed)
Cardiology Office Note Date:  11/24/2016   ID:  [name], Patricia 06-06-1936, MRN [id number]  PCP:  Jeanmarie Hubert, MD  Cardiologist:  Sherren Mocha, MD    Chief Complaint  Patient presents with  . Aortic Stenosis    s/p TAVR     History of Present Illness: Patricia [name] is a 81 y.o. female who presents for follow-up after undergoing TAVR via a percutaneous left transfemoral approach 10-14-2016. Her immediate post-operative course was uncomplicated and she was discharged home on POD #2.   The patient is here alone today. She is doing fairly well. States that she's had a few "asthma attacks" since discharge from the hospital. These have responded well to nebulizer treatments. She denies edema, orthopnea, or PND. She's had no syncope or presyncope. She remains short of breath with physical activity but states she hasn't had to wear her oxygen as much.   Past Medical History:  Diagnosis Date  . Allergy   . Anemia, unspecified   . Arthritis   . Asthma   . Atrial fibrillation status post cardioversion [facility]) 09/2015   s/p TEE/DCCV>>SR on amio  . Benign neoplasm of colon   . Carpal tunnel syndrome   . Cellulitis and abscess of finger, unspecified   . Cerumen impaction   . Cervicalgia   . CHF (congestive heart failure) ([place])   . Chronic airway obstruction, not elsewhere classified   . Chronic anticoagulation 09/2015   Xarelto for afib, CHADS2VASC=5  . Diarrhea   . Diffuse cystic mastopathy   . Dysrhythmia   . Edema   . Encounter for long-term (current) use of other medications   . GERD (gastroesophageal reflux disease)   . Heart murmur   . Lumbago   . Neck pain 05/23/2015  . Obesity, unspecified   . Other and unspecified hyperlipidemia   . Other psoriasis   . Pain in joint, lower leg   . PONV (postoperative nausea and vomiting)   . Primary localized osteoarthrosis of right shoulder 12/18/2015  . Routine gynecological examination   . S/P TAVR (transcatheter aortic  valve replacement) 10/14/2016   23 mm Edwards Sapien 3 transcatheter heart valve placed via percutaneous left transfemoral approach  . Scoliosis (and kyphoscoliosis), idiopathic   . Severe aortic stenosis   . Shortness of breath dyspnea    with exertion  . Type II or unspecified type diabetes mellitus without mention of complication, uncontrolled   . Unspecified essential hypertension   . Unspecified hemorrhoids without mention of complication   . Unspecified hypothyroidism   . Urge incontinence     Past Surgical History:  Procedure Laterality Date  . ABDOMINAL HYSTERECTOMY  1987   complete  . BREAST SURGERY     right breast 3 surgeries [phone]  . CARDIAC CATHETERIZATION N/A 09/02/2016   Procedure: Right/Left Heart Cath and Coronary Angiography;  Surgeon: [name] M Martinique, MD;  Location: [place] [facility];  Service: Cardiovascular;  Laterality: N/A;  . CARDIOVERSION N/A 10/19/2015   Procedure: CARDIOVERSION;  Surgeon: Lelon Perla, MD;  Location: [place];  Service: Cardiovascular;  Laterality: N/A;  . [place]  . COLON SURGERY     2004,2007,2013.  3 times colon surgieres  . EXCISE LE MANDIBULAR LYMPH NODE T     DR C. NEWMAN  . FOOT SURGERY  1989  . LEFT SHOULDER ARTHROSCOPY    . 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 NEUROPLASTY / TRANSPOSITION MEDIAN NERVE AT CARPAL TUNNEL  BILATERAL  05/2003  . TEE WITHOUT CARDIOVERSION N/A 10/19/2015   Procedure: Transesophageal Echocardiogram (TEE) ;  Surgeon: Lelon Perla, MD;  Location: [facility];  Service: Cardiovascular;  Laterality: N/A;  . TEE WITHOUT CARDIOVERSION N/A 10/14/2016   Procedure: TRANSESOPHAGEAL ECHOCARDIOGRAM (TEE);  Surgeon: Sherren Mocha, MD;  Location: [facility];  Service: Open Heart Surgery;  Laterality: N/A;  . TOTAL SHOULDER ARTHROPLASTY Right 12/18/2015   Procedure: TOTAL SHOULDER ARTHROPLASTY;  Surgeon: [name], MD;  Location: [facility];  Service: Orthopedics;  Laterality:  Right;  . TRANSCATHETER AORTIC VALVE REPLACEMENT, TRANSFEMORAL N/A 10/14/2016   Procedure: TRANSCATHETER AORTIC VALVE REPLACEMENT, TRANSFEMORAL;  Surgeon: Sherren Mocha, MD;  Location: [place];  Service: Open Heart Surgery;  Laterality: N/A;  . TRIGGER FINGER RELEASE Left   . VAGINAL CYST REMOVED  1967    Current Outpatient Prescriptions  Medication Sig Dispense Refill  . acetaminophen (TYLENOL) 500 MG tablet Take 500 mg by mouth every 6 (six) hours as needed for mild pain.    Marland Kitchen ADVAIR DISKUS 100-50 MCG/DOSE AEPB Inhale 1 inhalation two  times daily for breathing 180 each 3  . albuterol (PROVENTIL HFA;VENTOLIN HFA) 108 (90 Base) MCG/ACT inhaler Inhale 2 puffs into the lungs every 6 (six) hours as needed for wheezing or shortness of breath. 1 Inhaler 11  . albuterol (PROVENTIL) (2.5 MG/3ML) 0.083% nebulizer solution Take 3 mLs (2.5 mg total) by nebulization every 4 (four) hours. And as needed 240 mL 3  . amiodarone (PACERONE) 200 MG tablet Take one tablet daily by mouth 90 tablet 3  . apixaban (ELIQUIS) 5 MG TABS tablet Take 1 tablet (5 mg total) by mouth 2 (two) times daily. 60 tablet 3  . cetirizine (ZYRTEC) 10 MG tablet Take 10 mg by mouth daily as needed for allergies.     . chlorhexidine (PERIDEX) 0.12 % solution Rinse with 15 mls twice daily for 30 seconds. Use after breakfast and at bedtime. Spit out excess. Do not swallow. 480 mL prn  . cholecalciferol (VITAMIN D) 1000 units tablet Take 1,000 Units by mouth daily.    . COMFORT LANCETS MISC Use daily to get blood for glucometer 100 each 3  . doxazosin (CARDURA) 4 MG tablet TAKE 1 TABLET BY MOUTH ONCE DAILY TO HELP CONTROL BLOOD PRESSURE 90 tablet 0  . ferrous sulfate 325 (65 FE) MG EC tablet Take 1 tablet (325 mg total) by mouth daily with breakfast. 30 tablet 6  . furosemide (LASIX) 40 MG tablet Take 1 tablet (40 mg total) by mouth daily. 30 tablet 0  . glimepiride (AMARYL) 2 MG tablet TAKE 1 TABLET BY MOUTH AT  BREAKFAST TO CONTROL   GLUCOSE 90 tablet 1  . glucose blood (BAYER CONTOUR TEST) test strip Use daily too check glucose 100 each 4  . Ketotifen Fumarate (ALLERGY EYE DROPS OP) Apply 1 drop to eye daily as needed (allergies).    Marland Kitchen levothyroxine (SYNTHROID, LEVOTHROID) 50 MCG tablet Take 1 tablet by mouth  daily for thyroid  supplement (Patient taking differently: Take 1 tablet by mouth at night for thyroid  supplement) 90 tablet 1  . lisinopril (PRINIVIL,ZESTRIL) 10 MG tablet Take 1 tablet (10 mg total) by mouth daily. 30 tablet 3  . metFORMIN (GLUCOPHAGE) 500 MG tablet Take 500 mg by mouth 2 (two) times daily with a meal.    . pantoprazole (PROTONIX) 40 MG tablet Take 1 tablet (40 mg total) by mouth daily. 90 tablet 1  . potassium gluconate 595 (99 K) MG  TABS tablet Take 595 mg by mouth daily. TAKES ONLY WHEN USING FUROSEMIDE    . pravastatin (PRAVACHOL) 20 MG tablet TAKE 1 TABLET BY MOUTH  DAILY (Patient taking differently: Take 20 mg by mouth every evening. ) 90 tablet 1  . triamcinolone (KENALOG) 0.025 % cream Apply 1 application topically daily as needed (psoriasis).     Marland Kitchen aspirin EC 81 MG tablet Take 1 tablet (81 mg total) by mouth daily.     No current facility-administered medications for this visit.     Allergies:   Codeine and Vesicare [solifenacin]   Social History:  The patient  reports that she quit smoking about 27 years ago. She has a 40.00 pack-year smoking history. She has never used smokeless tobacco. She reports that she does not drink alcohol or use drugs.   Family History:  The patient's  family history includes Arthritis in her sister; Asthma in her sister; Diabetes in her father; Heart disease in her brother and father; Liver cancer in her sister; Stroke in her father.    ROS:  Please see the history of present illness.  Otherwise, review of systems is positive for Abdominal pain, diarrhea, back pain, wheezing, balance problems, headaches.  All other systems are reviewed and negative.     PHYSICAL EXAM: VS:  BP (!) 158/56   Pulse 76   Ht _0  (1.6 m)   Wt 213 lb 1.9 oz (96.7 kg)   BMI 37.75 kg/m  , BMI Body mass index is 37.75 kg/m. GEN: Well nourished, well developed, in no acute distress  HEENT: normal  Neck: no JVD, no masses.  Cardiac: RRR with 2/6 systolic murmur at the RUSB          Respiratory:  clear to auscultation bilaterally, normal work of breathing GI: soft, nontender, nondistended, + BS MS: no deformity or atrophy  Ext: no pretibial edema, pedal pulses 2+= bilaterally Skin: warm and dry, no rash Neuro:  Strength and sensation are intact Psych: euthymic mood, full affect  EKG:  EKG is not ordered today.  Recent Labs: 09/12/2016: TSH 1.297 09/13/2016: B Natriuretic Peptide 263.7 10/10/2016: ALT 35 10/15/2016: Magnesium 1.9 10/16/2016: BUN 29; Creatinine, Ser 1.36; Hemoglobin 8.4; Platelets 187; Potassium 4.5; Sodium 140   Lipid Panel     Component Value Date/Time   CHOL 182 04/28/2016 0940   TRIG 174 (H) 04/28/2016 0940   HDL 61 04/28/2016 0940   CHOLHDL 3.0 04/28/2016 0940   LDLCALC 86 04/28/2016 0940      Wt Readings from Last 3 Encounters:  11/24/16 213 lb 1.9 oz (96.7 kg)  10/24/16 213 lb 6.4 oz (96.8 kg)  10/16/16 224 lb (101.6 kg)     Cardiac Studies Reviewed: 2-D echocardiogram: Left ventricle:  The cavity size was normal. Wall thickness was increased in a pattern of mild LVH. There was mild focal basal hypertrophy of the septum. Systolic function was vigorous. The estimated ejection fraction was in the range of 65% to 70%. Wall motion was normal; there were no regional wall motion abnormalities. Features are consistent with a pseudonormal left ventricular filling pattern, with concomitant abnormal relaxation and increased filling pressure (grade 2 diastolic dysfunction). Doppler parameters are consistent with high ventricular  filling pressure.  ------------------------------------------------------------------- Aortic valve:  A bioprosthesis was present.  Doppler:  There was no significant regurgitation.    VTI ratio of LVOT to aortic valve: 0.51. Valve area (VTI): 1.46 cm^2. Indexed valve area (VTI): 0.69 cm^2/m^2. Peak velocity ratio of LVOT to  aortic valve: 0.5. Valve area (Vmax): 1.42 cm^2. Indexed valve area (Vmax): 0.67 cm^2/m^2. Mean velocity ratio of LVOT to aortic valve: 0.5. Valve area (Vmean): 1.43 cm^2. Indexed valve area (Vmean): 0.68 cm^2/m^2. Mean gradient (S): 29 mm Hg. Peak gradient (S): 53 mm Hg.  ------------------------------------------------------------------- Aorta:  Aortic root: The aortic root was normal in size.  ------------------------------------------------------------------- Mitral valve:   Severely calcified annulus. Mildly thickened leaflets . Mobility was not restricted.  Doppler:  Transvalvular velocity was within the normal range. There was no evidence for stenosis. There was mild regurgitation.    Peak gradient (D): 9 mm Hg.  ------------------------------------------------------------------- Left atrium:  The atrium was severely dilated.  ------------------------------------------------------------------- Right ventricle:  The cavity size was normal. Systolic function was normal.  ------------------------------------------------------------------- Pulmonic valve:    Doppler:  Transvalvular velocity was within the normal range. There was no evidence for stenosis.  ------------------------------------------------------------------- Tricuspid valve:   Structurally normal valve.    Doppler: Transvalvular velocity was within the normal range. There was mild regurgitation.  ------------------------------------------------------------------- Pulmonary artery:   Systolic pressure was moderately  increased.  ------------------------------------------------------------------- Right atrium:  The atrium was mildly dilated.  ------------------------------------------------------------------- Pericardium:  There was no pericardial effusion.  ASSESSMENT AND PLAN: 81 year old woman with aortic valve disease presenting for 30 day TAVR follow-up evaluation. She has New York Heart Association functional class II symptoms of chronic diastolic heart failure with significant baseline lung disease. Symptomatically she has had modest improvement since undergoing TAVR. She seems to be tolerating Everlene Balls for anticoagulation in the setting of paroxysmal atrial fibrillation. I asked her to add aspirin 81 mg to her regimen.  The patient's echo study and images are reviewed. She has higher than expected transvalvular gradients. There is no paravalvular aortic valve insufficiency. The patient has vigorous LV function. Her peak and mean gradients are 53 and 28 mmHg. These are increased from her postoperative day #1 echo study when they were 33 and 18 mmHg. Her ejection murmur on physical exam has benign characteristics.  I suspect the issue is a combination of vigorous LV function and patient prosthesis mismatch in this patient with a small aortic valve annulus. As above, will add aspirin 81 mg and follow-up with a repeat echo when she is out to one year.I've recommended that she see Dr. Martinique in about 3 months.  Current medicines are reviewed with the patient today.  The patient does not have concerns regarding medicines. Add ASA 81 mg.   Labs/ tests ordered today include:   Orders Placed This Encounter  Procedures  . ECHOCARDIOGRAM COMPLETE    Disposition:   FU 3 months with Dr Martinique, one year with repeat echo and Valve Clinic visit.   Deatra James, MD  11/24/2016 1:50 PM    [facility] [address], [place], [place]  [address] Phone: [phone]; Fax:  [phone]

## 2016-11-27 ENCOUNTER — Encounter (HOSPITAL_COMMUNITY)
Admission: RE | Admit: 2016-11-27 | Discharge: 2016-11-27 | Disposition: A | Discharge status: Home / Self Care | Payer: Medicare Other | Source: Ambulatory Visit | Attending: Cardiovascular Disease | Admitting: Cardiovascular Disease

## 2016-11-27 ENCOUNTER — Encounter (HOSPITAL_COMMUNITY): Payer: Self-pay

## 2016-11-27 VITALS — BP 150/60 | HR 63 | Ht 63.0 in | Wt 217.4 lb

## 2016-11-27 DIAGNOSIS — Z952 Presence of prosthetic heart valve: Secondary | ICD-10-CM | POA: Diagnosis not present

## 2016-11-27 NOTE — Progress Notes (Signed)
Cardiac Rehab Medication Review by a Pharmacist  Does the patient  feel that his/her medications are working for him/her?  yes  Has the patient been experiencing any side effects to the medications prescribed?  yes  Does the patient measure his/her own blood pressure or blood glucose at home?  yes   Does the patient have any problems obtaining medications due to transportation or finances?   no  Understanding of regimen: good Understanding of indications: good Potential of compliance: good    Pharmacist comments: KB is a 53 [place] presenting today for cardiac rehab orientation in good spirits. She had good understanding of her medication regimen and endorses adherence. Of note, she does take her levothyroxine at night, which is unusual. I counseled her that it is normally taken in the morning on an empty stomach, but told her to discuss with her doctor first before changing in case there was a reason for her to take it at night. At this visit, she also endorses that she will have random bouts of dizziness that requires her to sit down. She checks her BP and blood sugar during these episodes and those do not appear to be low. There is also no pattern as far as a time of day that these occur. It is unclear if a medication may be related since BP and blood sugars are normal. Otherwise, no medication concerns.   Myer Peer Grayland Ormond), PharmD  PGY1 Pharmacy Resident Pager: [phone] 11/27/2016 9:00 AM

## 2016-11-27 NOTE — Progress Notes (Signed)
Cardiac Individual Treatment Plan  Patient Details  Name: Patricia [name] MRN: [id number] Date of Birth: 1936-09-16 Referring Provider:   Flowsheet Row CARDIAC REHAB PHASE II ORIENTATION from 11/27/2016 in [facility]  Referring Provider  Sherren Mocha MD      Initial Encounter Date:  [place] PHASE II ORIENTATION from 11/27/2016 in [facility]  Date  11/27/16  Referring Provider  Sherren Mocha MD      Visit Diagnosis: 10/14/16 S/P TAVR (transcatheter aortic valve replacement)  Patient's Home Medications on Admission:  Current Outpatient Prescriptions:  .  acetaminophen (TYLENOL) 500 MG tablet, Take 500 mg by mouth every 6 (six) hours as needed for mild pain., Disp: , Rfl:  .  ADVAIR DISKUS 100-50 MCG/DOSE AEPB, Inhale 1 inhalation two  times daily for breathing, Disp: 180 each, Rfl: 3 .  albuterol (PROVENTIL HFA;VENTOLIN HFA) 108 (90 Base) MCG/ACT inhaler, Inhale 2 puffs into the lungs every 6 (six) hours as needed for wheezing or shortness of breath., Disp: 1 Inhaler, Rfl: 11 .  albuterol (PROVENTIL) (2.5 MG/3ML) 0.083% nebulizer solution, Take 3 mLs (2.5 mg total) by nebulization every 4 (four) hours. And as needed, Disp: 240 mL, Rfl: 3 .  amiodarone (PACERONE) 200 MG tablet, Take one tablet daily by mouth, Disp: 90 tablet, Rfl: 3 .  apixaban (ELIQUIS) 5 MG TABS tablet, Take 1 tablet (5 mg total) by mouth 2 (two) times daily., Disp: 60 tablet, Rfl: 3 .  aspirin EC 81 MG tablet, Take 1 tablet (81 mg total) by mouth daily., Disp: , Rfl:  .  cetirizine (ZYRTEC) 10 MG tablet, Take 10 mg by mouth daily as needed for allergies. , Disp: , Rfl:  .  chlorhexidine (PERIDEX) 0.12 % solution, Rinse with 15 mls twice daily for 30 seconds. Use after breakfast and at bedtime. Spit out excess. Do not swallow., Disp: 480 mL, Rfl: prn .  cholecalciferol (VITAMIN D) 1000 units tablet, Take 1,000 Units by mouth daily.,  Disp: , Rfl:  .  COMFORT LANCETS MISC, Use daily to get blood for glucometer, Disp: 100 each, Rfl: 3 .  doxazosin (CARDURA) 4 MG tablet, TAKE 1 TABLET BY MOUTH ONCE DAILY TO HELP CONTROL BLOOD PRESSURE, Disp: 90 tablet, Rfl: 0 .  ferrous sulfate 325 (65 FE) MG EC tablet, Take 1 tablet (325 mg total) by mouth daily with breakfast., Disp: 30 tablet, Rfl: 6 .  furosemide (LASIX) 40 MG tablet, Take 1 tablet (40 mg total) by mouth daily., Disp: 30 tablet, Rfl: 0 .  glimepiride (AMARYL) 2 MG tablet, TAKE 1 TABLET BY MOUTH AT  BREAKFAST TO CONTROL  GLUCOSE, Disp: 90 tablet, Rfl: 1 .  glucose blood (BAYER CONTOUR TEST) test strip, Use daily too check glucose, Disp: 100 each, Rfl: 4 .  Ketotifen Fumarate (ALLERGY EYE DROPS OP), Apply 1 drop to eye daily as needed (allergies)., Disp: , Rfl:  .  levothyroxine (SYNTHROID, LEVOTHROID) 50 MCG tablet, Take 1 tablet by mouth  daily for thyroid  supplement (Patient taking differently: Take 1 tablet by mouth at night for thyroid  supplement), Disp: 90 tablet, Rfl: 1 .  lisinopril (PRINIVIL,ZESTRIL) 10 MG tablet, Take 1 tablet (10 mg total) by mouth daily., Disp: 30 tablet, Rfl: 3 .  metFORMIN (GLUCOPHAGE) 500 MG tablet, Take 500 mg by mouth 2 (two) times daily with a meal., Disp: , Rfl:  .  pantoprazole (PROTONIX) 40 MG tablet, Take 1 tablet (40 mg total) by  mouth daily., Disp: 90 tablet, Rfl: 1 .  potassium gluconate 595 (99 K) MG TABS tablet, Take 595 mg by mouth daily. TAKES ONLY WHEN USING FUROSEMIDE, Disp: , Rfl:  .  pravastatin (PRAVACHOL) 20 MG tablet, TAKE 1 TABLET BY MOUTH  DAILY (Patient taking differently: Take 20 mg by mouth every evening. ), Disp: 90 tablet, Rfl: 1 .  triamcinolone (KENALOG) 0.025 % cream, Apply 1 application topically daily as needed (psoriasis). , Disp: , Rfl:   Past Medical History: Past Medical History:  Diagnosis Date  . Allergy   . Anemia, unspecified   . Arthritis   . Asthma   . Atrial fibrillation status post cardioversion  [facility]) 09/2015   s/p TEE/DCCV>>SR on amio  . Benign neoplasm of colon   . Carpal tunnel syndrome   . Cellulitis and abscess of finger, unspecified   . Cerumen impaction   . Cervicalgia   . CHF (congestive heart failure) ([place])   . Chronic airway obstruction, not elsewhere classified   . Chronic anticoagulation 09/2015   Xarelto for afib, CHADS2VASC=5  . Diarrhea   . Diffuse cystic mastopathy   . Dysrhythmia   . Edema   . Encounter for long-term (current) use of other medications   . GERD (gastroesophageal reflux disease)   . Heart murmur   . Lumbago   . Neck pain 05/23/2015  . Obesity, unspecified   . Other and unspecified hyperlipidemia   . Other psoriasis   . Pain in joint, lower leg   . PONV (postoperative nausea and vomiting)   . Primary localized osteoarthrosis of right shoulder 12/18/2015  . Routine gynecological examination   . 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 Scoliosis (and kyphoscoliosis), idiopathic   . Severe aortic stenosis   . Shortness of breath dyspnea    with exertion  . Type II or unspecified type diabetes mellitus without mention of complication, uncontrolled   . Unspecified essential hypertension   . Unspecified hemorrhoids without mention of complication   . Unspecified hypothyroidism   . Urge incontinence     Tobacco Use: History  Smoking Status  . Former Smoker  . Packs/day: 1.00  . Years: 40.00  . Quit date: 09/07/1989  Smokeless Tobacco  . Never Used    Labs: Recent Review Flowsheet Data    Labs for ITP Cardiac and Pulmonary Rehab Latest Ref Rng & Units 10/14/2016 10/14/2016 10/14/2016 10/14/2016 10/14/2016   Cholestrol 100 - 199 mg/dL - - - - -   LDLCALC 0 - 99 mg/dL - - - - -   HDL >39 mg/dL - - - - -   Trlycerides 0 - 149 mg/dL - - - - -   Hemoglobin A1c 4.8 - 5.6 % - - - - -   PHART 7.350 - 7.450 - 7.401  7.403 7.365 7.385   PCO2ART 32.0 - 48.0 mmHg - 49.7(H) 50.0(H) 55.0(H) 51.9(H)   HCO3 20.0 - 28.0 mmol/L - 30.8(H) 31.6(H) 31.8(H) 31.4(H)   TCO2 0 - 100 mmol/L 29 32 33 34 33   O2SAT % - 99.0 98.0 97.0 96.0      Capillary Blood Glucose: Lab Results  Component Value Date   GLUCAP 88 10/16/2016   GLUCAP 143 (H) 10/16/2016   GLUCAP 115 (H) 10/16/2016   GLUCAP 111 (H) 10/16/2016   GLUCAP 122 (H) 10/15/2016     Exercise Target Goals: Date: 11/27/16  Exercise Program Goal: Individual exercise prescription set with THRR, safety & activity barriers. Participant demonstrates ability to understand and report RPE using BORG scale, to self-measure pulse accurately, and to acknowledge the importance of the exercise prescription.  Exercise Prescription Goal: Starting with aerobic activity 30 plus minutes a day, 3 days per week for initial exercise prescription. Provide home exercise prescription and guidelines that participant acknowledges understanding prior to discharge.  Activity Barriers & Risk Stratification:     Activity Barriers & Cardiac Risk Stratification - 11/27/16 0937      Activity Barriers & Cardiac Risk Stratification   Activity Barriers Joint Problems;Deconditioning;Muscular Weakness;Shortness of Breath;Arthritis;Other (comment)   Comments R shoulder replacement, B knee pain and R-sided hip/sciatica pain      6 Minute Walk:     6 Minute Walk    Row Name 11/27/16 0938 11/27/16 1031 11/27/16 1228     6 Minute Walk   Phase Initial  -  -   Distance 400 feet  -  -   Walk Time 2.16 minutes  -  -   # of Rest Breaks 1  pt stopped _0 .16 and did not want to continue  -  -   MPH  -  - 2.1   METS  -  - 0.35   RPE 13  -  -   VO2 Peak  -  - 1.23   Symptoms Yes (comment)  -  -   Comments 9/10 R knee pain , 3/10 R hip pain 9/10 R knee pain , 3/10 R hip pain- pt did not continue with walk test due to hip/knee pain  -   Resting HR 63 bpm  -  -   Resting BP 150/60  -  -    Max Ex. HR 89 bpm  -  -   Max Ex. BP 162/64  -  -   2 Minute Post BP 152/66  -  -      Initial Exercise Prescription:     Initial Exercise Prescription - 11/27/16 1200      Date of Initial Exercise RX and Referring Provider   Date 11/27/16   Referring Provider Sherren Mocha MD     Recumbant Bike   Level --   Minutes --   METs --     NuStep   Level 1   Minutes 20   METs 1.3     Arm Ergometer   Level 1   Minutes 10   METs 1     Prescription Details   Frequency (times per week) 3   Duration Progress to 30 minutes of continuous aerobic without signs/symptoms of physical distress     Intensity   THRR 40-80% of Max Heartrate 57-114   Ratings of Perceived Exertion 11-13   Perceived Dyspnea 0-4     Progression   Progression Continue progressive overload as per policy without signs/symptoms or physical distress.     Resistance Training   Training Prescription Yes   Weight 1lb   Reps 10-12      Perform Capillary Blood Glucose checks as needed.  Exercise Prescription Changes:   Exercise Comments:   Discharge Exercise Prescription (Final Exercise Prescription Changes):   Nutrition:  Target Goals: Understanding of nutrition guidelines, daily intake of sodium <1555m, cholesterol <2069m calories 30% from fat and 7% or less from saturated fats, daily to have 5 or more servings of fruits and vegetables.  Biometrics:     Pre Biometrics - 11/27/16 1230  Pre Biometrics   % Body Fat 52.9 %       Nutrition Therapy Plan and Nutrition Goals:   Nutrition Discharge: Nutrition Scores:     Nutrition Assessments - 11/27/16 1048      MEDFICTS Scores   Pre Score 54      Nutrition Goals Re-Evaluation:   Psychosocial: Target Goals: Acknowledge presence or absence of depression, maximize coping skills, provide positive support system. Participant is able to verbalize types and ability to use techniques and skills needed for reducing stress and  depression.  Initial Review & Psychosocial Screening:     Initial Psych Review & Screening - 11/27/16 1645      Initial Review   Current issues with History of Depression     Family Dynamics   Good Support System? Yes     Barriers   Psychosocial barriers to participate in program The patient should benefit from training in stress management and relaxation.      Quality of Life Scores:     Quality of Life - 11/27/16 1257      Quality of Life Scores   Health/Function Pre 19.27 %   Socioeconomic Pre 23.42 %   Psych/Spiritual Pre 22.5 %   Family Pre 30 %   GLOBAL Pre 22.05 %      PHQ-9: Recent Review Flowsheet Data    Depression screen [facility] 2/9 09/16/2016 01/23/2016 10/18/2015 07/18/2015 01/24/2015   Decreased Interest 0 0 0 0 0   Down, Depressed, Hopeless 0 0 0 0 0   PHQ - 2 Score 0 0 0 0 0      Psychosocial Evaluation and Intervention:   Psychosocial Re-Evaluation:   Vocational Rehabilitation: Provide vocational rehab assistance to qualifying candidates.   Vocational Rehab Evaluation & Intervention:   Education: Education Goals: Education classes will be provided on a weekly basis, covering required topics. Participant will state understanding/return demonstration of topics presented.  Learning Barriers/Preferences:     Learning Barriers/Preferences - 11/27/16 2585      Learning Barriers/Preferences   Learning Barriers Sight   Learning Preferences Skilled Demonstration;Verbal Instruction;Video;Written Material      Education Topics: Count Your Pulse:  -Group instruction provided by verbal instruction, demonstration, patient participation and written materials to support subject.  Instructors address importance of being able to find your pulse and how to count your pulse when at home without a heart monitor.  Patients get hands on experience counting their pulse with staff help and individually.   Heart Attack, Angina, and Risk Factor Modification:   -Group instruction provided by verbal instruction, video, and written materials to support subject.  Instructors address signs and symptoms of angina and heart attacks.    Also discuss risk factors for heart disease and how to make changes to improve heart health risk factors.   Functional Fitness:  -Group instruction provided by verbal instruction, demonstration, patient participation, and written materials to support subject.  Instructors address safety measures for doing things around the house.  Discuss how to get up and down off the floor, how to pick things up properly, how to safely get out of a chair without assistance, and balance training.   Meditation and Mindfulness:  -Group instruction provided by verbal instruction, patient participation, and written materials to support subject.  Instructor addresses importance of mindfulness and meditation practice to help reduce stress and improve awareness.  Instructor also leads participants through a meditation exercise.    Stretching for Flexibility and Mobility:  -Group instruction provided by  verbal instruction, patient participation, and written materials to support subject.  Instructors lead participants through series of stretches that are designed to increase flexibility thus improving mobility.  These stretches are additional exercise for major muscle groups that are typically performed during regular warm up and cool down.   Hands Only CPR Anytime:  -Group instruction provided by verbal instruction, video, patient participation and written materials to support subject.  Instructors co-teach with AHA video for hands only CPR.  Participants get hands on experience with mannequins.   Nutrition I class: Heart Healthy Eating:  -Group instruction provided by PowerPoint slides, verbal discussion, and written materials to support subject matter. The instructor gives an explanation and review of the Therapeutic Lifestyle Changes diet  recommendations, which includes a discussion on lipid goals, dietary fat, sodium, fiber, plant stanol/sterol esters, sugar, and the components of a well-balanced, healthy diet.   Nutrition II class: Lifestyle Skills:  -Group instruction provided by PowerPoint slides, verbal discussion, and written materials to support subject matter. The instructor gives an explanation and review of label reading, grocery shopping for heart health, heart healthy recipe modifications, and ways to make healthier choices when eating out.   Diabetes Question & Answer:  -Group instruction provided by PowerPoint slides, verbal discussion, and written materials to support subject matter. The instructor gives an explanation and review of diabetes co-morbidities, pre- and post-prandial blood glucose goals, pre-exercise blood glucose goals, signs, symptoms, and treatment of hypoglycemia and hyperglycemia, and foot care basics.   Diabetes Blitz:  -Group instruction provided by PowerPoint slides, verbal discussion, and written materials to support subject matter. The instructor gives an explanation and review of the physiology behind type 1 and type 2 diabetes, diabetes medications and rational behind using different medications, pre- and post-prandial blood glucose recommendations and Hemoglobin A1c goals, diabetes diet, and exercise including blood glucose guidelines for exercising safely.    Portion Distortion:  -Group instruction provided by PowerPoint slides, verbal discussion, written materials, and food models to support subject matter. The instructor gives an explanation of serving size versus portion size, changes in portions sizes over the last 20 years, and what consists of a serving from each food group.   Stress Management:  -Group instruction provided by verbal instruction, video, and written materials to support subject matter.  Instructors review role of stress in heart disease and how to cope with stress  positively.     Exercising on Your Own:  -Group instruction provided by verbal instruction, power point, and written materials to support subject.  Instructors discuss benefits of exercise, components of exercise, frequency and intensity of exercise, and end points for exercise.  Also discuss use of nitroglycerin and activating EMS.  Review options of places to exercise outside of rehab.  Review guidelines for sex with heart disease.   Cardiac Drugs I:  -Group instruction provided by verbal instruction and written materials to support subject.  Instructor reviews cardiac drug classes: antiplatelets, anticoagulants, beta blockers, and statins.  Instructor discusses reasons, side effects, and lifestyle considerations for each drug class.   Cardiac Drugs II:  -Group instruction provided by verbal instruction and written materials to support subject.  Instructor reviews cardiac drug classes: angiotensin converting enzyme inhibitors (ACE-I), angiotensin II receptor blockers (ARBs), nitrates, and calcium channel blockers.  Instructor discusses reasons, side effects, and lifestyle considerations for each drug class.   Anatomy and Physiology of the Circulatory System:  -Group instruction provided by verbal instruction, video, and written materials to support subject.  Reviews functional  anatomy of heart, how it relates to various diagnoses, and what role the heart plays in the overall system.   Knowledge Questionnaire Score:     Knowledge Questionnaire Score - 11/27/16 1228      Knowledge Questionnaire Score   Pre Score 19/24      Core Components/Risk Factors/Patient Goals at Admission:     Personal Goals and Risk Factors at Admission - 11/27/16 1249      Core Components/Risk Factors/Patient Goals on Admission    Weight Management Yes;Obesity;Weight Loss   Intervention Weight Management: Develop a combined nutrition and exercise program designed to reach desired caloric intake, while  maintaining appropriate intake of nutrient and fiber, sodium and fats, and appropriate energy expenditure required for the weight goal.;Weight Management: Provide education and appropriate resources to help participant work on and attain dietary goals.;Obesity: Provide education and appropriate resources to help participant work on and attain dietary goals.;Weight Management/Obesity: Establish reasonable short term and long term weight goals.   Expected Outcomes Short Term: Continue to assess and modify interventions until short term weight is achieved;Weight Maintenance: Understanding of the daily nutrition guidelines, which includes 25-35% calories from fat, 7% or less cal from saturated fats, less than 244m cholesterol, less than 1.5gm of sodium, & 5 or more servings of fruits and vegetables daily;Long Term: Adherence to nutrition and physical activity/exercise program aimed toward attainment of established weight goal;Weight Loss: Understanding of general recommendations for a balanced deficit meal plan, which promotes 1-2 lb weight loss per week and includes a negative energy balance of [phone] kcal/d;Understanding recommendations for meals to include 15-35% energy as protein, 25-35% energy from fat, 35-60% energy from carbohydrates, less than 2062mof dietary cholesterol, 20-35 gm of total fiber daily;Understanding of distribution of calorie intake throughout the day with the consumption of 4-5 meals/snacks   Sedentary Yes   Intervention Provide advice, education, support and counseling about physical activity/exercise needs.;Develop an individualized exercise prescription for aerobic and resistive training based on initial evaluation findings, risk stratification, comorbidities and participant's personal goals.   Expected Outcomes Achievement of increased cardiorespiratory fitness and enhanced flexibility, muscular endurance and strength shown through measurements of functional capacity and personal  statement of participant.   Increase Strength and Stamina Yes   Intervention Provide advice, education, support and counseling about physical activity/exercise needs.;Develop an individualized exercise prescription for aerobic and resistive training based on initial evaluation findings, risk stratification, comorbidities and participant's personal goals.   Expected Outcomes Achievement of increased cardiorespiratory fitness and enhanced flexibility, muscular endurance and strength shown through measurements of functional capacity and personal statement of participant.   Improve shortness of breath with ADL's Yes   Intervention Provide education, individualized exercise plan and daily activity instruction to help decrease symptoms of SOB with activities of daily living.   Expected Outcomes Short Term: Achieves a reduction of symptoms when performing activities of daily living.   Diabetes Yes   Intervention Provide education about signs/symptoms and action to take for hypo/hyperglycemia.;Provide education about proper nutrition, including hydration, and aerobic/resistive exercise prescription along with prescribed medications to achieve blood glucose in normal ranges: Fasting glucose 65-99 mg/dL   Expected Outcomes Short Term: Participant verbalizes understanding of the signs/symptoms and immediate care of hyper/hypoglycemia, proper foot care and importance of medication, aerobic/resistive exercise and nutrition plan for blood glucose control.;Long Term: Attainment of HbA1C < 7%.   Hypertension Yes   Intervention Provide education on lifestyle modifcations including regular physical activity/exercise, weight management, moderate sodium restriction and increased consumption of  fresh fruit, vegetables, and low fat dairy, alcohol moderation, and smoking cessation.;Monitor prescription use compliance.   Expected Outcomes Short Term: Continued assessment and intervention until BP is < 140/77m HG in  hypertensive participants. < 130/890mHG in hypertensive participants with diabetes, heart failure or chronic kidney disease.;Long Term: Maintenance of blood pressure at goal levels.   Lipids Yes   Intervention Provide education and support for participant on nutrition & aerobic/resistive exercise along with prescribed medications to achieve LDL <7055mHDL >33m49m Expected Outcomes Short Term: Participant states understanding of desired cholesterol values and is compliant with medications prescribed. Participant is following exercise prescription and nutrition guidelines.;Long Term: Cholesterol controlled with medications as prescribed, with individualized exercise RX and with personalized nutrition plan. Value goals: LDL < 70mg62mL > 40 mg.   Stress Yes   Intervention Offer individual and/or small group education and counseling on adjustment to heart disease, stress management and health-related lifestyle change. Teach and support self-help strategies.;Refer participants experiencing significant psychosocial distress to appropriate mental health specialists for further evaluation and treatment. When possible, include family members and significant others in education/counseling sessions.   Expected Outcomes Short Term: Participant demonstrates changes in health-related behavior, relaxation and other stress management skills, ability to obtain effective social support, and compliance with psychotropic medications if prescribed.;Long Term: Emotional wellbeing is indicated by absence of clinically significant psychosocial distress or social isolation.      Core Components/Risk Factors/Patient Goals Review:    Core Components/Risk Factors/Patient Goals at Discharge (Final Review):    ITP Comments:     ITP Comments    Row Name 11/27/16 0920           ITP Comments Dr. TraciFransico Himical Director          Comments:  Pt in today for cardiac rehab orientation phase II from 0800 - 1030.  As  a part of the appt pt completed warm up stretches and 6 minute walk test.  Pt able to ambulate  400 feet before having to stop due to chronic knee pain, hip pain and deconditioning.  Pt used rolling wheel chair for support and used 2L oxygen therapy.  Monitor showed SR with BBB. This is present on most recent 12 lead ekg  Brief psychosocial assessment reveals supportive family, daughter is RT at [facility] with some health related concerns due to advanced age and large number of medical issues. Pt desires to improve functional capacity and lose 50 pounds.  Pt is looking forward to participating in cardiac rehab. CarleCherre Huger

## 2016-12-01 ENCOUNTER — Encounter (HOSPITAL_COMMUNITY)
Admission: RE | Admit: 2016-12-01 | Discharge: 2016-12-01 | Disposition: A | Discharge status: Home / Self Care | Payer: Medicare Other | Source: Ambulatory Visit | Attending: Cardiovascular Disease | Admitting: Cardiovascular Disease

## 2016-12-01 DIAGNOSIS — Z952 Presence of prosthetic heart valve: Secondary | ICD-10-CM | POA: Diagnosis not present

## 2016-12-01 LAB — GLUCOSE, CAPILLARY
Glucose-Capillary: 122 mg/dL — ABNORMAL HIGH (ref 65–99)
Glucose-Capillary: 97 mg/dL (ref 65–99)

## 2016-12-01 NOTE — Progress Notes (Signed)
Daily Session Note  Patient Details  Name: Patricia [name] MRN: [id number] Date of Birth: May 19, 1936 Referring Provider:   Flowsheet Row CARDIAC REHAB PHASE II ORIENTATION from 11/27/2016 in [place]  Referring Provider  Sherren Mocha MD      Encounter Date: 12/01/2016  Check In:     Session Check In - 12/01/16 1142      Check-In   Location [facility]   Staff Present Maurice Small, RN, BSN;Amber Fair, MS, ACSM RCEP, Exercise Physiologist;Joann Rion, RN, Marga Melnick, RN, [facility] di[facility]incenzo, MS, ACSM RCEP, Exercise Physiologist   Supervising physician immediately available to respond to emergencies [facility] immediately available   Physician(s) Dr. Cathlean Sauer   Medication changes reported     No   Fall or balance concerns reported    No   Warm-up and Cool-down Performed as group-led Location manager Performed Yes   VAD Patient? No     Pain Assessment   Currently in Pain? No/denies      Capillary Blood Glucose: Results for orders placed or performed during the hospital encounter of 12/01/16 (from the past 24 hour(s))  Glucose, capillary     Status: Abnormal   Collection Time: 12/01/16 11:29 AM  Result Value Ref Range   Glucose-Capillary 122 (H) 65 - 99 mg/dL     Goals Met:  Exercise tolerated well Personal goals reviewed No report of cardiac concerns or symptoms  Goals Unmet:  Not Applicable  Comments:  Pt started full exercise in phase II cardiac rehab today.  Pt tolerated light exercise with some chronic musculoskeletal symptoms.  Equipment switched to accommodate pt knee and shoulder issues. VSS, telemetry-  BBB with ST depression , asymptomatic.  Medication list reconciled. Pt denies barriers to medication compliance.  PSYCHOSOCIAL ASSESSMENT:  PHQ-0. Pt exhibits positive coping skills, hopeful outlook with supportive family Pt daughter is a RT at [employer] and is heavily involved in her  medical care. Pt completed pre assessment quality of life survey.  Pt scored the following     Quality of Life - 11/27/16 1257      Quality of Life Scores   Health/Function Pre 19.27 %   Socioeconomic Pre 23.42 %   Psych/Spiritual Pre 22.5 %   Family Pre 30 %   GLOBAL Pre 22.05 %    Pt with limitations due to the chronic and ongoing medical issues she is dealing with.  No psychosocial needs identified at this time, no psychosocial interventions necessary.    Pt enjoys watching TV.  Pt desires to improve her functional mobility to perform every day activates with ease. Pt would like to be able to wean off o2 therapy. Will do trials of exercise without o2 support to see if pt can maintain her o2 sat. Pt would like to lose weight with a long term goal of 50 pounds. Talked with pt about 1-2 pounds per week for successful maintenance of weight loss.  Pt oriented to exercise equipment and routine.  Understanding verbalized.Maurice Small RN, BSN    Dr. Fransico Him is Medical Director for Cardiac Rehab at [facility].

## 2016-12-03 ENCOUNTER — Encounter (HOSPITAL_COMMUNITY)
Admission: RE | Admit: 2016-12-03 | Discharge: 2016-12-03 | Disposition: A | Discharge status: Home / Self Care | Payer: Medicare Other | Source: Ambulatory Visit | Attending: Cardiovascular Disease | Admitting: Cardiovascular Disease

## 2016-12-03 DIAGNOSIS — Z952 Presence of prosthetic heart valve: Secondary | ICD-10-CM

## 2016-12-03 DIAGNOSIS — M19011 Primary osteoarthritis, right shoulder: Secondary | ICD-10-CM | POA: Diagnosis not present

## 2016-12-03 LAB — GLUCOSE, CAPILLARY
Glucose-Capillary: 102 mg/dL — ABNORMAL HIGH (ref 65–99)
Glucose-Capillary: 123 mg/dL — ABNORMAL HIGH (ref 65–99)

## 2016-12-05 ENCOUNTER — Encounter (HOSPITAL_COMMUNITY)
Admission: RE | Admit: 2016-12-05 | Discharge: 2016-12-05 | Disposition: A | Discharge status: Home / Self Care | Payer: Medicare Other | Source: Ambulatory Visit | Attending: Cardiovascular Disease | Admitting: Cardiovascular Disease

## 2016-12-05 DIAGNOSIS — Z952 Presence of prosthetic heart valve: Secondary | ICD-10-CM | POA: Diagnosis not present

## 2016-12-05 LAB — GLUCOSE, CAPILLARY
Glucose-Capillary: 110 mg/dL — ABNORMAL HIGH (ref 65–99)
Glucose-Capillary: 117 mg/dL — ABNORMAL HIGH (ref 65–99)

## 2016-12-08 ENCOUNTER — Ambulatory Visit (HOSPITAL_COMMUNITY)
Admission: EM | Admit: 2016-12-08 | Discharge: 2016-12-08 | Discharge status: Against Medical Advice | Payer: Medicare Other

## 2016-12-08 ENCOUNTER — Encounter (HOSPITAL_COMMUNITY)
Admission: RE | Admit: 2016-12-08 | Discharge: 2016-12-08 | Disposition: A | Discharge status: Home / Self Care | Payer: Medicare Other | Source: Ambulatory Visit | Attending: Cardiovascular Disease | Admitting: Cardiovascular Disease

## 2016-12-08 DIAGNOSIS — Z952 Presence of prosthetic heart valve: Secondary | ICD-10-CM | POA: Diagnosis not present

## 2016-12-08 LAB — GLUCOSE, CAPILLARY: Glucose-Capillary: 145 mg/dL — ABNORMAL HIGH (ref 65–99)

## 2016-12-10 ENCOUNTER — Encounter (HOSPITAL_COMMUNITY)
Admission: RE | Admit: 2016-12-10 | Discharge: 2016-12-10 | Disposition: A | Discharge status: Home / Self Care | Payer: Medicare Other | Source: Ambulatory Visit | Attending: Cardiovascular Disease | Admitting: Cardiovascular Disease

## 2016-12-10 DIAGNOSIS — Z952 Presence of prosthetic heart valve: Secondary | ICD-10-CM

## 2016-12-10 NOTE — Progress Notes (Signed)
Reviewed home exercise with pt today.  Pt plans to walk in home for exercise for a total time of 30 minutes, 2x/week in addition to coming to cardiac rehab.  Reviewed THR, pulse, RPE, sign and symptoms, and when to call 911 or MD.  Also discussed weather considerations and indoor options.  Pt voiced understanding.     Natthew Marlatt [employer]

## 2016-12-12 ENCOUNTER — Encounter (HOSPITAL_COMMUNITY): Payer: Medicare Other

## 2016-12-12 ENCOUNTER — Telehealth (HOSPITAL_COMMUNITY): Payer: Self-pay | Admitting: Internal Medicine

## 2016-12-15 ENCOUNTER — Encounter (HOSPITAL_COMMUNITY)
Admission: RE | Admit: 2016-12-15 | Discharge: 2016-12-15 | Disposition: A | Discharge status: Home / Self Care | Payer: Medicare Other | Source: Ambulatory Visit | Attending: Cardiovascular Disease | Admitting: Cardiovascular Disease

## 2016-12-15 DIAGNOSIS — Z952 Presence of prosthetic heart valve: Secondary | ICD-10-CM

## 2016-12-15 NOTE — Progress Notes (Signed)
Patricia [name] 81 y.o. female Nutrition Note Spoke with pt. Nutrition Plan and Nutrition Survey goals reviewed with pt. Pt is following Step 1 of the Therapeutic Lifestyle Changes diet. Pt wants to lose wt. Pt has been not been actively trying to lose wt at this time. Pt states she does not have a wt loss plan at this time "but I'm thinking about it." Wt loss tips reviewed. Pt is diabetic. Last A1c indicates blood glucose well-controlled. This Probation officer went over Diabetes Education test results. Pt typically checks CBG's 1-2 times a week. Since starting rehab, pt checks her CBG's 3 times/week. Pt with dx of CHF. Per discussion, pt uses mostly fresh/frozen vegetables and tries to limit canned/convenience foods. Pt rarely adds salt to food. Pt expressed understanding of the information reviewed. Pt aware of nutrition education classes offered and is not interested in attending nutrition classes at this time.  Lab Results  Component Value Date   HGBA1C 6.8 (H) 10/10/2016   Wt Readings from Last 3 Encounters:  11/27/16 217 lb 6 oz (98.6 kg)  11/24/16 213 lb 1.9 oz (96.7 kg)  10/24/16 213 lb 6.4 oz (96.8 kg)   Nutrition Diagnosis ? Food-and nutrition-related knowledge deficit related to lack of exposure to information as related to diagnosis of: ? CVD ? DM ? Obesity related to excessive energy intake as evidenced by a BMI of 38.6  Nutrition Intervention ? Pt's individual nutrition plan reviewed with pt. ? Benefits of adopting Therapeutic Lifestyle Changes discussed when Medficts reviewed. ? Pt to attend the Portion Distortion class ? Pt to attend the Diabetes Q & A class  ? Pt given handouts for: ? Nutrition I class ? Nutrition II class ? Diabetes Blitz Class ? Continue client-centered nutrition education by RD, as part of interdisciplinary care. Goal(s) ? Pt to identify and limit food sources of saturated fat, trans fat, and sodium ? Pt to identify food quantities necessary to achieve weight loss  of 6-24 lb (2.7-10.9 kg) at graduation from cardiac rehab.  ? CBG concentrations in the normal range or as close to normal as is safely possible. Monitor and Evaluate progress toward nutrition goal with team. Derek Mound, M.Ed, RD, LDN, CDE 12/15/2016 1:33 PM

## 2016-12-17 ENCOUNTER — Encounter (HOSPITAL_COMMUNITY)
Admission: RE | Admit: 2016-12-17 | Discharge: 2016-12-17 | Disposition: A | Discharge status: Home / Self Care | Payer: Medicare Other | Source: Ambulatory Visit | Attending: Cardiovascular Disease | Admitting: Cardiovascular Disease

## 2016-12-17 DIAGNOSIS — Z952 Presence of prosthetic heart valve: Secondary | ICD-10-CM | POA: Diagnosis not present

## 2016-12-17 LAB — GLUCOSE, CAPILLARY: Glucose-Capillary: 114 mg/dL — ABNORMAL HIGH (ref 65–99)

## 2016-12-19 ENCOUNTER — Encounter (HOSPITAL_COMMUNITY)
Admission: RE | Admit: 2016-12-19 | Discharge: 2016-12-19 | Disposition: A | Discharge status: Home / Self Care | Payer: Medicare Other | Source: Ambulatory Visit | Attending: Cardiovascular Disease | Admitting: Cardiovascular Disease

## 2016-12-19 DIAGNOSIS — Z952 Presence of prosthetic heart valve: Secondary | ICD-10-CM | POA: Diagnosis not present

## 2016-12-22 ENCOUNTER — Encounter (HOSPITAL_COMMUNITY)
Admission: RE | Admit: 2016-12-22 | Discharge: 2016-12-22 | Disposition: A | Discharge status: Home / Self Care | Payer: Medicare Other | Source: Ambulatory Visit | Attending: Cardiovascular Disease | Admitting: Cardiovascular Disease

## 2016-12-22 DIAGNOSIS — Z952 Presence of prosthetic heart valve: Secondary | ICD-10-CM | POA: Diagnosis not present

## 2016-12-23 ENCOUNTER — Ambulatory Visit (INDEPENDENT_AMBULATORY_CARE_PROVIDER_SITE_OTHER): Payer: Medicare Other | Admitting: Nurse Practitioner

## 2016-12-23 ENCOUNTER — Encounter: Payer: Self-pay | Admitting: Nurse Practitioner

## 2016-12-23 VITALS — BP 132/68 | HR 72 | Temp 97.7°F | Resp 18 | Ht 63.0 in | Wt 215.0 lb

## 2016-12-23 DIAGNOSIS — R195 Other fecal abnormalities: Secondary | ICD-10-CM

## 2016-12-23 DIAGNOSIS — R1013 Epigastric pain: Secondary | ICD-10-CM | POA: Diagnosis not present

## 2016-12-23 LAB — COMPLETE METABOLIC PANEL WITH GFR
ALT: 21 U/L (ref 6–29)
AST: 20 U/L (ref 10–35)
Albumin: 3.6 g/dL (ref 3.6–5.1)
Alkaline Phosphatase: 58 U/L (ref 33–130)
BUN: 26 mg/dL — ABNORMAL HIGH (ref 7–25)
CO2: 30 mmol/L (ref 20–31)
Calcium: 9 mg/dL (ref 8.6–10.4)
Chloride: 99 mmol/L (ref 98–110)
Creat: 1.44 mg/dL — ABNORMAL HIGH (ref 0.60–0.93)
GFR, Est African American: 40 mL/min — ABNORMAL LOW (ref >=60)
GFR, Est Non African American: 35 mL/min — ABNORMAL LOW (ref >=60)
Glucose, Bld: 143 mg/dL — ABNORMAL HIGH (ref 65–99)
Potassium: 4.9 mmol/L (ref 3.5–5.3)
Sodium: 137 mmol/L (ref 135–146)
Total Bilirubin: 0.4 mg/dL (ref 0.2–1.2)
Total Protein: 6.9 g/dL (ref 6.1–8.1)

## 2016-12-23 LAB — CBC WITH DIFFERENTIAL/PLATELET
Basophils Absolute: 57 cells/uL (ref 0–200)
Basophils Relative: 1 %
Eosinophils Absolute: 171 cells/uL (ref 15–500)
Eosinophils Relative: 3 %
HCT: 24 % — ABNORMAL LOW (ref 35.0–45.0)
Hemoglobin: 7.4 g/dL — ABNORMAL LOW (ref 11.7–15.5)
Lymphocytes Relative: 19 %
Lymphs Abs: 1083 cells/uL (ref 850–3900)
MCH: 26.7 pg — ABNORMAL LOW (ref 27.0–33.0)
MCHC: 30.8 g/dL — ABNORMAL LOW (ref 32.0–36.0)
MCV: 86.6 fL (ref 80.0–100.0)
MPV: 9.4 fL (ref 7.5–12.5)
Monocytes Absolute: 513 cells/uL (ref 200–950)
Monocytes Relative: 9 %
Neutro Abs: 3876 cells/uL (ref 1500–7800)
Neutrophils Relative %: 68 %
Platelets: 261 10*3/uL (ref 140–400)
RBC: 2.77 MIL/uL — ABNORMAL LOW (ref 3.80–5.10)
RDW: 16.9 % — ABNORMAL HIGH (ref 11.0–15.0)
WBC: 5.7 10*3/uL (ref 3.8–10.8)

## 2016-12-23 LAB — LIPASE: Lipase: 34 U/L (ref 7–60)

## 2016-12-23 LAB — AMYLASE: Amylase: 38 U/L (ref 0–105)

## 2016-12-23 MED ORDER — FUROSEMIDE 40 MG PO TABS: 40.0000 mg | ORAL_TABLET | Freq: Every day | ORAL | 1 refills | Status: DC

## 2016-12-23 MED ORDER — PANTOPRAZOLE SODIUM 40 MG PO TBEC: 40.0000 mg | DELAYED_RELEASE_TABLET | Freq: Two times a day (BID) | ORAL | 0 refills | Status: DC

## 2016-12-23 MED ORDER — LEVOTHYROXINE SODIUM 50 MCG PO TABS: ORAL_TABLET | ORAL | 1 refills | Status: DC

## 2016-12-23 NOTE — Patient Instructions (Signed)
Stop Aspirin at this time Increase Protonix to twice daily Will get blood work today and refer to GI due to blood in stool

## 2016-12-23 NOTE — Progress Notes (Signed)
Careteam: Patient Care Team: Estill Dooms, MD as PCP - General (Internal Medicine) [name], MD as Consulting Physician (Orthopedic Surgery) Druscilla Brownie, MD as Consulting Physician (Dermatology) Ralene Bathe, MD as Consulting Physician (Ophthalmology) Ralene Bathe, MD as Consulting Physician (Ophthalmology)  Advanced Directive information Does Patient Have a Medical Advance Directive?: Yes, Type of Advance Directive: Healthcare Power of Attorney  Allergies  Allergen Reactions  . Codeine Other (See Comments)    "spaces her out, dizziness, can't walk well"  . Vesicare [Solifenacin] Itching    Chief Complaint  Patient presents with  . Acute Visit    Pt is being seen today for Stoamch issues, pain occurs across stomach X several months ,multiple bowel movements per day.     HPI: Patient is a 81 y.o. female seen in the office today for stomach issue. Has had stomach issues for years if she eats something wrong.  In the last couple of months has gotten much worse. Thought it was due to milk, stopped this and it was better but now when she wakes up in the morning it hits her. Increased cramping and multiple regular stools (not diarrhea).  No recent antibiotic use. No fevers or chills.  Having abdominal pain with episodes.  Cut out all diary which has helped other than the morning episodes.  Hx of GERD but this is different Good appetite.  Stomach pain gets better before she eats in the morning. No new medications.  HX of EGD and colonoscopy due to anemia without findings. Reports no bloody stools but stools with be dark at times. Sometimes black others just dark.  Review of Systems:  Review of Systems  Constitutional: Positive for fatigue. Negative for activity change, appetite change, chills, diaphoresis, fever and unexpected weight change.  HENT: Negative for congestion, ear discharge, ear pain, hearing loss and voice change.   Eyes: Negative for pain,  redness, itching and visual disturbance.  Respiratory: Negative for cough, choking, shortness of breath and wheezing.   Cardiovascular: Negative for chest pain, palpitations and leg swelling.       Sever Aortic valve stenosis  Gastrointestinal: Positive for abdominal pain. Negative for abdominal distention, constipation, diarrhea, nausea and vomiting.  Endocrine: Negative for cold intolerance, heat intolerance, polydipsia, polyphagia and polyuria.       Diabetic. Hypothyroid.  Genitourinary: Negative for difficulty urinating, dysuria, flank pain, frequency, hematuria, pelvic pain, urgency and vaginal discharge.  Musculoskeletal: Positive for arthralgias and back pain. Negative for gait problem, myalgias, neck pain and neck stiffness.       Post right shoulder arthroplasty in Feb. 2017. Has seen Dr. Eddie Dibbles about pain in the right knee. Got injection but it did not help. He talked about using a gel injection. Knee gives out sometimes. Using a cane at times. Recurrent problems with mild discomfort.  Skin: Negative for rash.  Allergic/Immunologic: Negative.   Neurological: Negative for dizziness, tremors, seizures, syncope, weakness, numbness and headaches.  Hematological: Negative for adenopathy. Does not bruise/bleed easily.       Anemic on lab in October 2016. Had EGD and colonoscopy without discovery of etiology.  Psychiatric/Behavioral: Negative.     Past Medical History:  Diagnosis Date  . Allergy   . Anemia, unspecified   . Arthritis   . Asthma   . Atrial fibrillation status post cardioversion [facility]) 09/2015   s/p TEE/DCCV>>SR on amio  . Benign neoplasm of colon   . Carpal tunnel syndrome   . Cellulitis and abscess of finger, unspecified   .  Cerumen impaction   . Cervicalgia   . CHF (congestive heart failure) ([place])   . Chronic airway obstruction, not elsewhere classified   . Chronic anticoagulation 09/2015   Xarelto for afib, CHADS2VASC=5  . Diarrhea   . Diffuse cystic  mastopathy   . Dysrhythmia   . Edema   . Encounter for long-term (current) use of other medications   . GERD (gastroesophageal reflux disease)   . Heart murmur   . Lumbago   . Neck pain 05/23/2015  . Obesity, unspecified   . Other and unspecified hyperlipidemia   . Other psoriasis   . Pain in joint, lower leg   . PONV (postoperative nausea and vomiting)   . Primary localized osteoarthrosis of right shoulder 12/18/2015  . Routine gynecological examination   . 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 Scoliosis (and kyphoscoliosis), idiopathic   . Severe aortic stenosis   . Shortness of breath dyspnea    with exertion  . Type II or unspecified type diabetes mellitus without mention of complication, uncontrolled   . Unspecified essential hypertension   . Unspecified hemorrhoids without mention of complication   . Unspecified hypothyroidism   . Urge incontinence    Past Surgical History:  Procedure Laterality Date  . ABDOMINAL HYSTERECTOMY  1987   complete  . BREAST SURGERY     right breast 3 surgeries [phone]  . CARDIAC CATHETERIZATION N/A 09/02/2016   Procedure: Right/Left Heart Cath and Coronary Angiography;  Surgeon: [name] M Martinique, MD;  Location: [place] CV LAB;  Service: Cardiovascular;  Laterality: N/A;  . CARDIOVERSION N/A 10/19/2015   Procedure: CARDIOVERSION;  Surgeon: Lelon Perla, MD;  Location: [place];  Service: Cardiovascular;  Laterality: N/A;  . [place]  . COLON SURGERY     2004,2007,2013.  3 times colon surgieres  . EXCISE LE MANDIBULAR LYMPH NODE T     DR C. NEWMAN  . FOOT SURGERY  1989  . LEFT SHOULDER ARTHROSCOPY    . 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 NEUROPLASTY / TRANSPOSITION MEDIAN NERVE AT CARPAL TUNNEL BILATERAL  05/2003  . TEE WITHOUT CARDIOVERSION N/A 10/19/2015   Procedure:  Transesophageal Echocardiogram (TEE) ;  Surgeon: Lelon Perla, MD;  Location: [facility] ENDOSCOPY;  Service: Cardiovascular;  Laterality: N/A;  . TEE WITHOUT CARDIOVERSION N/A 10/14/2016   Procedure: TRANSESOPHAGEAL ECHOCARDIOGRAM (TEE);  Surgeon: Sherren Mocha, MD;  Location: [place];  Service: Open Heart Surgery;  Laterality: N/A;  . TOTAL SHOULDER ARTHROPLASTY Right 12/18/2015   Procedure: TOTAL SHOULDER ARTHROPLASTY;  Surgeon: [name], MD;  Location: [place];  Service: Orthopedics;  Laterality: Right;  . TRANSCATHETER AORTIC VALVE REPLACEMENT, TRANSFEMORAL N/A 10/14/2016   Procedure: TRANSCATHETER AORTIC VALVE REPLACEMENT, TRANSFEMORAL;  Surgeon: Sherren Mocha, MD;  Location: [place];  Service: Open Heart Surgery;  Laterality: N/A;  . TRIGGER FINGER RELEASE Left   . VAGINAL CYST REMOVED  1967   Social History:   reports that she quit smoking about 27 years ago. She has a 40.00 pack-year smoking history. She has never used smokeless tobacco. She reports that she does not drink alcohol or use drugs.  Family History  Problem Relation Age of Onset  . Diabetes Father   . Stroke Father   . Heart disease Father   . Liver cancer Sister   . Heart disease Brother   . Asthma Sister   .  Arthritis Sister     Medications: Patient's Medications  New Prescriptions   No medications on file  Previous Medications   ACETAMINOPHEN (TYLENOL) 500 MG TABLET    Take 500 mg by mouth every 6 (six) hours as needed for mild pain.   ADVAIR DISKUS 100-50 MCG/DOSE AEPB    Inhale 1 inhalation two  times daily for breathing   ALBUTEROL (PROVENTIL HFA;VENTOLIN HFA) 108 (90 BASE) MCG/ACT INHALER    Inhale 2 puffs into the lungs every 6 (six) hours as needed for wheezing or shortness of breath.   ALBUTEROL (PROVENTIL) (2.5 MG/3ML) 0.083% NEBULIZER SOLUTION    Take 3 mLs (2.5 mg total) by nebulization every 4 (four) hours. And as needed   AMIODARONE (PACERONE) 200 MG TABLET    Take one tablet daily by mouth   APIXABAN  (ELIQUIS) 5 MG TABS TABLET    Take 1 tablet (5 mg total) by mouth 2 (two) times daily.   ASPIRIN EC 81 MG TABLET    Take 1 tablet (81 mg total) by mouth daily.   CETIRIZINE (ZYRTEC) 10 MG TABLET    Take 10 mg by mouth daily as needed for allergies.    CHLORHEXIDINE (PERIDEX) 0.12 % SOLUTION    Rinse with 15 mls twice daily for 30 seconds. Use after breakfast and at bedtime. Spit out excess. Do not swallow.   CHOLECALCIFEROL (VITAMIN D) 1000 UNITS TABLET    Take 1,000 Units by mouth daily.   COMFORT LANCETS MISC    Use daily to get blood for glucometer   DOXAZOSIN (CARDURA) 4 MG TABLET    TAKE 1 TABLET BY MOUTH ONCE DAILY TO HELP CONTROL BLOOD PRESSURE   FERROUS SULFATE 325 (65 FE) MG EC TABLET    Take 1 tablet (325 mg total) by mouth daily with breakfast.   FUROSEMIDE (LASIX) 40 MG TABLET    Take 1 tablet (40 mg total) by mouth daily.   GLIMEPIRIDE (AMARYL) 2 MG TABLET    TAKE 1 TABLET BY MOUTH AT  BREAKFAST TO CONTROL  GLUCOSE   GLUCOSE BLOOD (BAYER CONTOUR TEST) TEST STRIP    Use daily too check glucose   KETOTIFEN FUMARATE (ALLERGY EYE DROPS OP)    Apply 1 drop to eye daily as needed (allergies).   LEVOTHYROXINE (SYNTHROID, LEVOTHROID) 50 MCG TABLET    Take 1 tablet by mouth  daily for thyroid  supplement   LISINOPRIL (PRINIVIL,ZESTRIL) 10 MG TABLET    Take 1 tablet (10 mg total) by mouth daily.   METFORMIN (GLUCOPHAGE) 500 MG TABLET    Take 500 mg by mouth 2 (two) times daily with a meal.   PANTOPRAZOLE (PROTONIX) 40 MG TABLET    Take 1 tablet (40 mg total) by mouth daily.   POTASSIUM GLUCONATE 595 (99 K) MG TABS TABLET    Take 595 mg by mouth daily. TAKES ONLY WHEN USING FUROSEMIDE   PRAVASTATIN (PRAVACHOL) 20 MG TABLET    TAKE 1 TABLET BY MOUTH  DAILY   TRIAMCINOLONE (KENALOG) 0.025 % CREAM    Apply 1 application topically daily as needed (psoriasis).   Modified Medications   No medications on file  Discontinued Medications   No medications on file     Physical Exam:  Vitals:    12/23/16 0846  BP: 132/68  Pulse: 72  Resp: 18  Temp: 97.7 F (36.5 C)  TempSrc: Oral  SpO2: 93%  Weight: 215 lb (97.5 kg)  Height: 5' 3" (1.6 m)   Body mass  index is 38.09 kg/m.  Physical Exam  Constitutional: She is oriented to person, place, and time. No distress.  Morbid obesity  HENT:  Head: Normocephalic and atraumatic.  Eyes: Conjunctivae and EOM are normal.  Neck: Normal range of motion. Neck supple.  Cardiovascular: Normal rate and regular rhythm.  Exam reveals no gallop and no friction rub.   Murmur (2/6 SEM at the aortic area and LSB) heard. 3/6 aortic ejection murmur  Pulmonary/Chest: Effort normal and breath sounds normal.  Abdominal: Bowel sounds are normal. She exhibits no distension and no mass. There is tenderness (epigastric).  Musculoskeletal: Normal range of motion. She exhibits edema. She exhibits no tenderness.  Neurological: She is alert and oriented to person, place, and time. She has normal reflexes.  Skin: No rash noted. No erythema. No pallor.  Psychiatric: She has a normal mood and affect. Judgment and thought content normal.    Labs reviewed: Basic Metabolic Panel:  Recent Labs  04/07/16 1037 04/28/16 0940  09/12/16 0023  10/10/16 1408  10/14/16 0943 10/14/16 1036 10/15/16 0400 10/16/16 0428  NA 137 139  < >  --   < > 140  < > 137 138 138 140  K 4.3 4.2  < >  --   < > 4.4  < > 4.1 4.1 4.6 4.5  CL 99 95*  < >  --   < > 103  < > 96*  --  101 101  CO2 27 27  < >  --   < > 27  --   --   --  30 32  GLUCOSE 98 119*  < >  --   < > 89  < > 196* 191* 118* 99  BUN 32* 27  < >  --   < > 24*  < > 32*  --  23* 29*  CREATININE 1.27* 1.27*  < >  --   < > 1.22*  < > 1.30*  --  1.24* 1.36*  CALCIUM 8.9 8.8  < >  --   < > 9.5  --   --   --  8.8* 8.7*  MG  --   --   --   --   --   --   --   --   --  1.9  --   TSH 4.09 6.030*  --  1.297  --   --   --   --   --   --   --   < > = values in this interval not displayed. Liver Function Tests:  Recent  Labs  04/28/16 0940 09/22/16 1049 10/10/16 1408  AST _0 ALT 20 29 35  ALKPHOS 70 69 62  BILITOT 0.2 0.3 0.4  PROT 7.5 6.9 7.0  ALBUMIN 4.2 3.9 3.8   No results for input(s): LIPASE, AMYLASE in the last 8760 hours. No results for input(s): AMMONIA in the last 8760 hours. CBC:  Recent Labs  04/28/16 0940 08/27/16 1139  09/12/16 0643  10/14/16 1035 10/14/16 1036 10/15/16 0400 10/16/16 0428  WBC 6.5 6.4  < > 7.7  < > 5.9  --  7.9 6.8  NEUTROABS 3.8 4,288  --  7.0  --   --   --   --   --   HGB  --  10.4*  < > 9.5*  < > 8.3* 8.2* 8.4* 8.4*  HCT 33.1* 33.3*  < > 30.3*  < > 25.9* 24.0* 26.6* 26.4*  MCV  83 86.5  < > 85.8  < > 85.5  --  86.1 86.6  PLT 278 253  < > 231  < > 185  --  201 187  < > = values in this interval not displayed. Lipid Panel:  Recent Labs  04/28/16 0940  CHOL 182  HDL 61  LDLCALC 86  TRIG 174*  CHOLHDL 3.0   TSH:  Recent Labs  04/07/16 1037 04/28/16 0940 09/12/16 0023  TSH 4.09 6.030* 1.297   A1C: Lab Results  Component Value Date   HGBA1C 6.8 (H) 10/10/2016     Assessment/Plan Epigastric pain with Heme positive stool -pt on PPI, will increase to BID To stop ASA - CBC with Differential/Platelets - CMP with eGFR - Amylase - Lipase - Ambulatory referral to Gastroenterology for further evaluation of pain and blood in stools.  - pantoprazole (PROTONIX) 40 MG tablet; Take 1 tablet (40 mg total) by mouth 2 (two) times daily.  Dispense: 180 tablet; Refill: 0   Jessica K. Harle Battiest  [facility] [phone] 8 am - 5 pm) [phone] (after hours)

## 2016-12-24 ENCOUNTER — Encounter (HOSPITAL_COMMUNITY)
Admission: RE | Admit: 2016-12-24 | Discharge: 2016-12-24 | Disposition: A | Discharge status: Home / Self Care | Payer: Medicare Other | Source: Ambulatory Visit | Attending: Cardiovascular Disease | Admitting: Cardiovascular Disease

## 2016-12-24 ENCOUNTER — Encounter (HOSPITAL_COMMUNITY): Payer: Self-pay

## 2016-12-24 ENCOUNTER — Telehealth: Payer: Self-pay

## 2016-12-24 DIAGNOSIS — Z952 Presence of prosthetic heart valve: Secondary | ICD-10-CM | POA: Diagnosis not present

## 2016-12-24 NOTE — Telephone Encounter (Signed)
Discussed results with patient, patient verbalized understanding of results  Updated medication list, patient will await appointment date/time for GI referral

## 2016-12-24 NOTE — Telephone Encounter (Signed)
-----  Message from [name], NP sent at 12/24/2016 10:27 AM EST ----- hgb is down to 7.4 from 8.4 last month. Increase iron to twice daily due to blood in stools, also GI referral has been ordered.

## 2016-12-24 NOTE — Progress Notes (Signed)
Cardiac Individual Treatment Plan  Patient Details  Name: Patricia [name] MRN: [id number] Date of Birth: 1936/07/16 Referring Provider:   Flowsheet Row CARDIAC REHAB PHASE II ORIENTATION from 11/27/2016 in [place]  Referring Provider  Sherren Mocha MD      Initial Encounter Date:  [place] PHASE II ORIENTATION from 11/27/2016 in [place]  Date  11/27/16  Referring Provider  Sherren Mocha MD      Visit Diagnosis: 10/14/16 S/P TAVR (transcatheter aortic valve replacement)  Patient's Home Medications on Admission:  Current Outpatient Prescriptions:  .  acetaminophen (TYLENOL) 500 MG tablet, Take 500 mg by mouth every 6 (six) hours as needed for mild pain., Disp: , Rfl:  .  ADVAIR DISKUS 100-50 MCG/DOSE AEPB, Inhale 1 inhalation two  times daily for breathing, Disp: 180 each, Rfl: 3 .  albuterol (PROVENTIL HFA;VENTOLIN HFA) 108 (90 Base) MCG/ACT inhaler, Inhale 2 puffs into the lungs every 6 (six) hours as needed for wheezing or shortness of breath., Disp: 1 Inhaler, Rfl: 11 .  albuterol (PROVENTIL) (2.5 MG/3ML) 0.083% nebulizer solution, Take 3 mLs (2.5 mg total) by nebulization every 4 (four) hours. And as needed, Disp: 240 mL, Rfl: 3 .  amiodarone (PACERONE) 200 MG tablet, Take one tablet daily by mouth, Disp: 90 tablet, Rfl: 3 .  apixaban (ELIQUIS) 5 MG TABS tablet, Take 1 tablet (5 mg total) by mouth 2 (two) times daily., Disp: 60 tablet, Rfl: 3 .  cetirizine (ZYRTEC) 10 MG tablet, Take 10 mg by mouth daily as needed for allergies. , Disp: , Rfl:  .  chlorhexidine (PERIDEX) 0.12 % solution, Rinse with 15 mls twice daily for 30 seconds. Use after breakfast and at bedtime. Spit out excess. Do not swallow., Disp: 480 mL, Rfl: prn .  cholecalciferol (VITAMIN D) 1000 units tablet, Take 1,000 Units by mouth daily., Disp: , Rfl:  .  COMFORT LANCETS MISC, Use daily to get blood for glucometer, Disp: 100  each, Rfl: 3 .  doxazosin (CARDURA) 4 MG tablet, TAKE 1 TABLET BY MOUTH ONCE DAILY TO HELP CONTROL BLOOD PRESSURE, Disp: 90 tablet, Rfl: 0 .  ferrous sulfate 325 (65 FE) MG EC tablet, Take 325 mg by mouth 2 (two) times daily., Disp: , Rfl:  .  furosemide (LASIX) 40 MG tablet, Take 1 tablet (40 mg total) by mouth daily., Disp: 90 tablet, Rfl: 1 .  glimepiride (AMARYL) 2 MG tablet, TAKE 1 TABLET BY MOUTH AT  BREAKFAST TO CONTROL  GLUCOSE, Disp: 90 tablet, Rfl: 1 .  glucose blood (BAYER CONTOUR TEST) test strip, Use daily too check glucose, Disp: 100 each, Rfl: 4 .  Ketotifen Fumarate (ALLERGY EYE DROPS OP), Apply 1 drop to eye daily as needed (allergies)., Disp: , Rfl:  .  levothyroxine (SYNTHROID, LEVOTHROID) 50 MCG tablet, Take 1 tablet by mouth  daily for thyroid  supplement, Disp: 90 tablet, Rfl: 1 .  lisinopril (PRINIVIL,ZESTRIL) 10 MG tablet, Take 1 tablet (10 mg total) by mouth daily., Disp: 30 tablet, Rfl: 3 .  metFORMIN (GLUCOPHAGE) 500 MG tablet, Take 500 mg by mouth 2 (two) times daily with a meal., Disp: , Rfl:  .  pantoprazole (PROTONIX) 40 MG tablet, Take 1 tablet (40 mg total) by mouth 2 (two) times daily., Disp: 180 tablet, Rfl: 0 .  potassium gluconate 595 (99 K) MG TABS tablet, Take 595 mg by mouth daily. TAKES ONLY WHEN USING FUROSEMIDE, Disp: , Rfl:  .  pravastatin (PRAVACHOL) 20 MG tablet, TAKE 1 TABLET BY MOUTH  DAILY, Disp: 90 tablet, Rfl: 1 .  triamcinolone (KENALOG) 0.025 % cream, Apply 1 application topically daily as needed (psoriasis). , Disp: , Rfl:   Past Medical History: Past Medical History:  Diagnosis Date  . Allergy   . Anemia, unspecified   . Arthritis   . Asthma   . Atrial fibrillation status post cardioversion [facility]) 09/2015   s/p TEE/DCCV>>SR on amio  . Benign neoplasm of colon   . Carpal tunnel syndrome   . Cellulitis and abscess of finger, unspecified   . Cerumen impaction   . Cervicalgia   . CHF (congestive heart failure) ([place])   . Chronic airway  obstruction, not elsewhere classified   . Chronic anticoagulation 09/2015   Xarelto for afib, CHADS2VASC=5  . Diarrhea   . Diffuse cystic mastopathy   . Dysrhythmia   . Edema   . Encounter for long-term (current) use of other medications   . GERD (gastroesophageal reflux disease)   . Heart murmur   . Lumbago   . Neck pain 05/23/2015  . Obesity, unspecified   . Other and unspecified hyperlipidemia   . Other psoriasis   . Pain in joint, lower leg   . PONV (postoperative nausea and vomiting)   . Primary localized osteoarthrosis of right shoulder 12/18/2015  . Routine gynecological examination   . 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 Scoliosis (and kyphoscoliosis), idiopathic   . Severe aortic stenosis   . Shortness of breath dyspnea    with exertion  . Type II or unspecified type diabetes mellitus without mention of complication, uncontrolled   . Unspecified essential hypertension   . Unspecified hemorrhoids without mention of complication   . Unspecified hypothyroidism   . Urge incontinence     Tobacco Use: History  Smoking Status  . Former Smoker  . Packs/day: 1.00  . Years: 40.00  . Quit date: 09/07/1989  Smokeless Tobacco  . Never Used    Labs: Recent Review Flowsheet Data    Labs for ITP Cardiac and Pulmonary Rehab Latest Ref Rng & Units 10/14/2016 10/14/2016 10/14/2016 10/14/2016 10/14/2016   Cholestrol 100 - 199 mg/dL - - - - -   LDLCALC 0 - 99 mg/dL - - - - -   HDL >39 mg/dL - - - - -   Trlycerides 0 - 149 mg/dL - - - - -   Hemoglobin A1c 4.8 - 5.6 % - - - - -   PHART 7.350 - 7.450 - 7.401 7.403 7.365 7.385   PCO2ART 32.0 - 48.0 mmHg - 49.7(H) 50.0(H) 55.0(H) 51.9(H)   HCO3 20.0 - 28.0 mmol/L - 30.8(H) 31.6(H) 31.8(H) 31.4(H)   TCO2 0 - 100 mmol/L 29 32 33 34 33   O2SAT % - 99.0 98.0 97.0 96.0      Capillary Blood Glucose: Lab  Results  Component Value Date   GLUCAP 114 (H) 12/17/2016   GLUCAP 145 (H) 12/08/2016   GLUCAP 117 (H) 12/05/2016   GLUCAP 110 (H) 12/05/2016   GLUCAP 102 (H) 12/03/2016     Exercise Target Goals:    Exercise Program Goal: Individual exercise prescription set with THRR, safety & activity barriers. Participant demonstrates ability to understand and report RPE using BORG scale, to self-measure pulse accurately, and to acknowledge the importance of the exercise prescription.  Exercise Prescription Goal: Starting with  aerobic activity 30 plus minutes a day, 3 days per week for initial exercise prescription. Provide home exercise prescription and guidelines that participant acknowledges understanding prior to discharge.  Activity Barriers & Risk Stratification:     Activity Barriers & Cardiac Risk Stratification - 11/27/16 0937      Activity Barriers & Cardiac Risk Stratification   Activity Barriers Joint Problems;Deconditioning;Muscular Weakness;Shortness of Breath;Arthritis;Other (comment)   Comments R shoulder replacement, B knee pain and R-sided hip/sciatica pain      6 Minute Walk:     6 Minute Walk    Row Name 11/27/16 0938 11/27/16 1031 11/27/16 1228     6 Minute Walk   Phase Initial  -  -   Distance 400 feet  -  -   Walk Time 2.16 minutes  -  -   # of Rest Breaks 1  pt stopped _0 .16 and did not want to continue  -  -   MPH  -  - 2.1   METS  -  - 0.35   RPE 13  -  -   VO2 Peak  -  - 1.23   Symptoms Yes (comment)  -  -   Comments 9/10 R knee pain , 3/10 R hip pain 9/10 R knee pain , 3/10 R hip pain- pt did not continue with walk test due to hip/knee pain  -   Resting HR 63 bpm  -  -   Resting BP 150/60  -  -   Max Ex. HR 89 bpm  -  -   Max Ex. BP 162/64  -  -   2 Minute Post BP 152/66  -  -      Oxygen Initial Assessment:   Oxygen Re-Evaluation:   Oxygen Discharge (Final Oxygen Re-Evaluation):   Initial Exercise Prescription:     Initial Exercise  Prescription - 11/27/16 1200      Date of Initial Exercise RX and Referring Provider   Date 11/27/16   Referring Provider Sherren Mocha MD     Recumbant Bike   Level --   Minutes --   METs --     NuStep   Level 1   Minutes 20   METs 1.3     Arm Ergometer   Level 1   Minutes 10   METs 1     Prescription Details   Frequency (times per week) 3   Duration Progress to 30 minutes of continuous aerobic without signs/symptoms of physical distress     Intensity   THRR 40-80% of Max Heartrate 57-114   Ratings of Perceived Exertion 11-13   Perceived Dyspnea 0-4     Progression   Progression Continue progressive overload as per policy without signs/symptoms or physical distress.     Resistance Training   Training Prescription Yes   Weight 1lb   Reps 10-12      Perform Capillary Blood Glucose checks as needed.  Exercise Prescription Changes:     Exercise Prescription Changes    Row Name 12/23/16 1200             Response to Exercise   Blood Pressure (Admit) 124/62       Blood Pressure (Exercise) 124/70       Blood Pressure (Exit) 110/62       Heart Rate (Admit) 75 bpm       Heart Rate (Exercise) 85 bpm       Heart Rate (Exit) 75 bpm  Rating of Perceived Exertion (Exercise) 13       Symptoms none       Comments pt has d/c O2 and SpO2 has remained over 90% with exercise       Duration Continue with 30 min of aerobic exercise without signs/symptoms of physical distress.       Intensity THRR unchanged         Progression   Progression Continue to progress workloads to maintain intensity without signs/symptoms of physical distress.       Average METs 2.1         Resistance Training   Training Prescription Yes       Weight 2lbs       Reps 10-15       Time 10 Minutes         NuStep   Level 3       Minutes 20       METs 2         Arm Ergometer   Level 1       Minutes 10       METs 2.3         Home Exercise Plan   Plans to continue exercise at  Home (comment)       Frequency Add 2 additional days to program exercise sessions.       Initial Home Exercises Provided 12/10/16          Exercise Comments:     Exercise Comments    Row Name 12/23/16 1454           Exercise Comments Reviewed METs and goals. Pt is tolerating exercise well; will continue to monitor exercise progression.           Exercise Goals and Review:     Exercise Goals    Row Name 12/23/16 1451             Exercise Goals   Increase Physical Activity Yes       Intervention Provide advice, education, support and counseling about physical activity/exercise needs.;Develop an individualized exercise prescription for aerobic and resistive training based on initial evaluation findings, risk stratification, comorbidities and participant's personal goals.       Expected Outcomes Achievement of increased cardiorespiratory fitness and enhanced flexibility, muscular endurance and strength shown through measurements of functional capacity and personal statement of participant.       Increase Strength and Stamina Yes       Intervention Provide advice, education, support and counseling about physical activity/exercise needs.;Develop an individualized exercise prescription for aerobic and resistive training based on initial evaluation findings, risk stratification, comorbidities and participant's personal goals.       Expected Outcomes Achievement of increased cardiorespiratory fitness and enhanced flexibility, muscular endurance and strength shown through measurements of functional capacity and personal statement of participant.          Exercise Goals Re-Evaluation :     Exercise Goals Re-Evaluation    Row Name 12/23/16 1451             Exercise Goal Re-Evaluation   Exercise Goals Review Increase Physical Activity       Comments Pt stated "functional mobility is gradually improving"       Expected Outcomes Pt will continue to progress in exercise tolerance  and functional mobility           Discharge Exercise Prescription (Final Exercise Prescription Changes):     Exercise Prescription Changes - 12/23/16 1200  Response to Exercise   Blood Pressure (Admit) 124/62   Blood Pressure (Exercise) 124/70   Blood Pressure (Exit) 110/62   Heart Rate (Admit) 75 bpm   Heart Rate (Exercise) 85 bpm   Heart Rate (Exit) 75 bpm   Rating of Perceived Exertion (Exercise) 13   Symptoms none   Comments pt has d/c O2 and SpO2 has remained over 90% with exercise   Duration Continue with 30 min of aerobic exercise without signs/symptoms of physical distress.   Intensity THRR unchanged     Progression   Progression Continue to progress workloads to maintain intensity without signs/symptoms of physical distress.   Average METs 2.1     Resistance Training   Training Prescription Yes   Weight 2lbs   Reps 10-15   Time 10 Minutes     NuStep   Level 3   Minutes 20   METs 2     Arm Ergometer   Level 1   Minutes 10   METs 2.3     Home Exercise Plan   Plans to continue exercise at Home (comment)   Frequency Add 2 additional days to program exercise sessions.   Initial Home Exercises Provided 12/10/16      Nutrition:  Target Goals: Understanding of nutrition guidelines, daily intake of sodium <1543m, cholesterol <2060m calories 30% from fat and 7% or less from saturated fats, daily to have 5 or more servings of fruits and vegetables.  Biometrics:     Pre Biometrics - 11/27/16 1230      Pre Biometrics   % Body Fat 52.9 %       Nutrition Therapy Plan and Nutrition Goals:     Nutrition Therapy & Goals - 12/15/16 1330      Nutrition Therapy   Diet Carb Modified, Therapeutic Lifestyle Changes     Personal Nutrition Goals   Nutrition Goal Wt loss of 1-2 lb/week to a wt loss goal of 6-24 lb at graduation from [facility]    Intervention Plan   Intervention Prescribe, educate and counsel regarding individualized specific  dietary modifications aiming towards targeted core components such as weight, hypertension, lipid management, diabetes, heart failure and other comorbidities.   Expected Outcomes Short Term Goal: Understand basic principles of dietary content, such as calories, fat, sodium, cholesterol and nutrients.;Long Term Goal: Adherence to prescribed nutrition plan.      Nutrition Discharge: Nutrition Scores:     Nutrition Assessments - 12/15/16 1330      MEDFICTS Scores   Pre Score 70      Nutrition Goals Re-Evaluation:   Nutrition Goals Re-Evaluation:   Nutrition Goals Discharge (Final Nutrition Goals Re-Evaluation):   Psychosocial: Target Goals: Acknowledge presence or absence of significant depression and/or stress, maximize coping skills, provide positive support system. Participant is able to verbalize types and ability to use techniques and skills needed for reducing stress and depression.  Initial Review & Psychosocial Screening:     Initial Psych Review & Screening - 12/01/16 1220      Family Dynamics   Good Support System? Yes     Barriers   Psychosocial barriers to participate in program There are no identifiable barriers or psychosocial needs.      Quality of Life Scores:     Quality of Life - 11/27/16 1257      Quality of Life Scores   Health/Function Pre 19.27 %   Socioeconomic Pre 23.42 %   Psych/Spiritual Pre 22.5 %   Family Pre 30 %  GLOBAL Pre 22.05 %      PHQ-9: Recent Review Flowsheet Data    Depression screen [facility] 2/9 12/01/2016 09/16/2016 01/23/2016 10/18/2015 07/18/2015   Decreased Interest 0 0 0 0 0   Down, Depressed, Hopeless 0 0 0 0 0   PHQ - 2 Score 0 0 0 0 0     Interpretation of Total Score  Total Score Depression Severity:  1-4 = Minimal depression, 5-9 = Mild depression, 10-14 = Moderate depression, 15-19 = Moderately severe depression, 20-27 = Severe depression   Psychosocial Evaluation and Intervention:     Psychosocial Evaluation  - 12/24/16 1756      Psychosocial Evaluation & Interventions   Interventions Stress management education;Relaxation education;Encouraged to exercise with the program and follow exercise prescription   Comments good support at home, no needs or interventions neeeded   Continue Psychosocial Services  No Follow up required      Psychosocial Re-Evaluation:     Psychosocial Re-Evaluation    [place] Name 12/24/16 1756             Psychosocial Re-Evaluation   Current issues with None Identified       Interventions Encouraged to attend Cardiac Rehabilitation for the exercise       Continue Psychosocial Services  No Follow up required          Psychosocial Discharge (Final Psychosocial Re-Evaluation):     Psychosocial Re-Evaluation - 12/24/16 1756      Psychosocial Re-Evaluation   Current issues with None Identified   Interventions Encouraged to attend Cardiac Rehabilitation for the exercise   Continue Psychosocial Services  No Follow up required      Vocational Rehabilitation: Provide vocational rehab assistance to qualifying candidates.   Vocational Rehab Evaluation & Intervention:   Education: Education Goals: Education classes will be provided on a weekly basis, covering required topics. Participant will state understanding/return demonstration of topics presented.  Learning Barriers/Preferences:     Learning Barriers/Preferences - 11/27/16 6010      Learning Barriers/Preferences   Learning Barriers Sight   Learning Preferences Skilled Demonstration;Verbal Instruction;Video;Written Material      Education Topics: Count Your Pulse:  -Group instruction provided by verbal instruction, demonstration, patient participation and written materials to support subject.  Instructors address importance of being able to find your pulse and how to count your pulse when at home without a heart monitor.  Patients get hands on experience counting their pulse with staff help and  individually.   Heart Attack, Angina, and Risk Factor Modification:  -Group instruction provided by verbal instruction, video, and written materials to support subject.  Instructors address signs and symptoms of angina and heart attacks.    Also discuss risk factors for heart disease and how to make changes to improve heart health risk factors.   Functional Fitness:  -Group instruction provided by verbal instruction, demonstration, patient participation, and written materials to support subject.  Instructors address safety measures for doing things around the house.  Discuss how to get up and down off the floor, how to pick things up properly, how to safely get out of a chair without assistance, and balance training.   Meditation and Mindfulness:  -Group instruction provided by verbal instruction, patient participation, and written materials to support subject.  Instructor addresses importance of mindfulness and meditation practice to help reduce stress and improve awareness.  Instructor also leads participants through a meditation exercise.  Flowsheet Row CARDIAC REHAB PHASE II EXERCISE from 12/24/2016 in [place]  [place]  Date  12/17/16  Educator  Jeanella Craze  Instruction Review Code  2- meets goals/outcomes      Stretching for Flexibility and Mobility:  -Group instruction provided by verbal instruction, patient participation, and written materials to support subject.  Instructors lead participants through series of stretches that are designed to increase flexibility thus improving mobility.  These stretches are additional exercise for major muscle groups that are typically performed during regular warm up and cool down. Flowsheet Row CARDIAC REHAB PHASE II EXERCISE from 12/24/2016 in [place]  Date  12/19/16  Instruction Review Code  2- meets goals/outcomes      Hands Only CPR Anytime:  -Group instruction provided by verbal  instruction, video, patient participation and written materials to support subject.  Instructors co-teach with [facility] video for hands only CPR.  Participants get hands on experience with mannequins.   Nutrition I class: Heart Healthy Eating:  -Group instruction provided by PowerPoint slides, verbal discussion, and written materials to support subject matter. The instructor gives an explanation and review of the Therapeutic Lifestyle Changes diet recommendations, which includes a discussion on lipid goals, dietary fat, sodium, fiber, plant stanol/sterol esters, sugar, and the components of a well-balanced, healthy diet. Flowsheet Row CARDIAC REHAB PHASE II EXERCISE from 12/24/2016 in [place]  Date  12/15/16  Educator  RD  Instruction Review Code  Not applicable [class handouts given]      Nutrition II class: Lifestyle Skills:  -Group instruction provided by PowerPoint slides, verbal discussion, and written materials to support subject matter. The instructor gives an explanation and review of label reading, grocery shopping for heart health, heart healthy recipe modifications, and ways to make healthier choices when eating out. Flowsheet Row CARDIAC REHAB PHASE II EXERCISE from 12/24/2016 in [place]  Date  12/15/16  Educator  RD  Instruction Review Code  Not applicable [class handouts given]      Diabetes Question & Answer:  -Group instruction provided by PowerPoint slides, verbal discussion, and written materials to support subject matter. The instructor gives an explanation and review of diabetes co-morbidities, pre- and post-prandial blood glucose goals, pre-exercise blood glucose goals, signs, symptoms, and treatment of hypoglycemia and hyperglycemia, and foot care basics. Flowsheet Row CARDIAC REHAB PHASE II EXERCISE from 12/24/2016 in [place]  Date  12/05/16  Educator  RD  Instruction  Review Code  2- meets goals/outcomes      Diabetes Blitz:  -Group instruction provided by PowerPoint slides, verbal discussion, and written materials to support subject matter. The instructor gives an explanation and review of the physiology behind type 1 and type 2 diabetes, diabetes medications and rational behind using different medications, pre- and post-prandial blood glucose recommendations and Hemoglobin A1c goals, diabetes diet, and exercise including blood glucose guidelines for exercising safely.  Flowsheet Row CARDIAC REHAB PHASE II EXERCISE from 12/24/2016 in [place]  Date  12/15/16  Educator  RD  Instruction Review Code  Not applicable [class handouts given]      Portion Distortion:  -Group instruction provided by PowerPoint slides, verbal discussion, written materials, and food models to support subject matter. The instructor gives an explanation of serving size versus portion size, changes in portions sizes over the last 20 years, and what consists of a serving from each food group. Flowsheet Row CARDIAC REHAB PHASE II EXERCISE from 12/24/2016 in [place]  [place]  Date  12/24/16  Educator  RD  Instruction Review Code  2- meets goals/outcomes      Stress Management:  -Group instruction provided by verbal instruction, video, and written materials to support subject matter.  Instructors review role of stress in heart disease and how to cope with stress positively.     Exercising on Your Own:  -Group instruction provided by verbal instruction, power point, and written materials to support subject.  Instructors discuss benefits of exercise, components of exercise, frequency and intensity of exercise, and end points for exercise.  Also discuss use of nitroglycerin and activating EMS.  Review options of places to exercise outside of rehab.  Review guidelines for sex with heart disease.   Cardiac Drugs I:  -Group  instruction provided by verbal instruction and written materials to support subject.  Instructor reviews cardiac drug classes: antiplatelets, anticoagulants, beta blockers, and statins.  Instructor discusses reasons, side effects, and lifestyle considerations for each drug class. Flowsheet Row CARDIAC REHAB PHASE II EXERCISE from 12/24/2016 in [place]  Date  12/10/16  Instruction Review Code  2- meets goals/outcomes      Cardiac Drugs II:  -Group instruction provided by verbal instruction and written materials to support subject.  Instructor reviews cardiac drug classes: angiotensin converting enzyme inhibitors (ACE-I), angiotensin II receptor blockers (ARBs), nitrates, and calcium channel blockers.  Instructor discusses reasons, side effects, and lifestyle considerations for each drug class.   Anatomy and Physiology of the Circulatory System:  -Group instruction provided by verbal instruction, video, and written materials to support subject.  Reviews functional anatomy of heart, how it relates to various diagnoses, and what role the heart plays in the overall system. Flowsheet Row CARDIAC REHAB PHASE II EXERCISE from 12/24/2016 in [place]  Date  12/03/16  Instruction Review Code  2- meets goals/outcomes      Knowledge Questionnaire Score:     Knowledge Questionnaire Score - 11/27/16 1228      Knowledge Questionnaire Score   Pre Score 19/24      Core Components/Risk Factors/Patient Goals at Admission:     Personal Goals and Risk Factors at Admission - 11/27/16 1249      Core Components/Risk Factors/Patient Goals on Admission    Weight Management Yes;Obesity;Weight Loss   Intervention Weight Management: Develop a combined nutrition and exercise program designed to reach desired caloric intake, while maintaining appropriate intake of nutrient and fiber, sodium and fats, and appropriate energy expenditure required  for the weight goal.;Weight Management: Provide education and appropriate resources to help participant work on and attain dietary goals.;Obesity: Provide education and appropriate resources to help participant work on and attain dietary goals.;Weight Management/Obesity: Establish reasonable short term and long term weight goals.   Expected Outcomes Short Term: Continue to assess and modify interventions until short term weight is achieved;Weight Maintenance: Understanding of the daily nutrition guidelines, which includes 25-35% calories from fat, 7% or less cal from saturated fats, less than 275m cholesterol, less than 1.5gm of sodium, & 5 or more servings of fruits and vegetables daily;Long Term: Adherence to nutrition and physical activity/exercise program aimed toward attainment of established weight goal;Weight Loss: Understanding of general recommendations for a balanced deficit meal plan, which promotes 1-2 lb weight loss per week and includes a negative energy balance of [phone] kcal/d;Understanding recommendations for meals to include 15-35% energy as protein, 25-35% energy from fat, 35-60% energy from carbohydrates, less than 2027mof dietary  cholesterol, 20-35 gm of total fiber daily;Understanding of distribution of calorie intake throughout the day with the consumption of 4-5 meals/snacks   Sedentary Yes   Intervention Provide advice, education, support and counseling about physical activity/exercise needs.;Develop an individualized exercise prescription for aerobic and resistive training based on initial evaluation findings, risk stratification, comorbidities and participant's personal goals.   Expected Outcomes Achievement of increased cardiorespiratory fitness and enhanced flexibility, muscular endurance and strength shown through measurements of functional capacity and personal statement of participant.   Increase Strength and Stamina Yes   Intervention Provide advice, education, support and  counseling about physical activity/exercise needs.;Develop an individualized exercise prescription for aerobic and resistive training based on initial evaluation findings, risk stratification, comorbidities and participant's personal goals.   Expected Outcomes Achievement of increased cardiorespiratory fitness and enhanced flexibility, muscular endurance and strength shown through measurements of functional capacity and personal statement of participant.   Improve shortness of breath with ADL's Yes   Intervention Provide education, individualized exercise plan and daily activity instruction to help decrease symptoms of SOB with activities of daily living.   Expected Outcomes Short Term: Achieves a reduction of symptoms when performing activities of daily living.   Diabetes Yes   Intervention Provide education about signs/symptoms and action to take for hypo/hyperglycemia.;Provide education about proper nutrition, including hydration, and aerobic/resistive exercise prescription along with prescribed medications to achieve blood glucose in normal ranges: Fasting glucose 65-99 mg/dL   Expected Outcomes Short Term: Participant verbalizes understanding of the signs/symptoms and immediate care of hyper/hypoglycemia, proper foot care and importance of medication, aerobic/resistive exercise and nutrition plan for blood glucose control.;Long Term: Attainment of HbA1C < 7%.   Hypertension Yes   Intervention Provide education on lifestyle modifcations including regular physical activity/exercise, weight management, moderate sodium restriction and increased consumption of fresh fruit, vegetables, and low fat dairy, alcohol moderation, and smoking cessation.;Monitor prescription use compliance.   Expected Outcomes Short Term: Continued assessment and intervention until BP is < 140/23m HG in hypertensive participants. < 130/826mHG in hypertensive participants with diabetes, heart failure or chronic kidney  disease.;Long Term: Maintenance of blood pressure at goal levels.   Lipids Yes   Intervention Provide education and support for participant on nutrition & aerobic/resistive exercise along with prescribed medications to achieve LDL <7029mHDL >63m43m Expected Outcomes Short Term: Participant states understanding of desired cholesterol values and is compliant with medications prescribed. Participant is following exercise prescription and nutrition guidelines.;Long Term: Cholesterol controlled with medications as prescribed, with individualized exercise RX and with personalized nutrition plan. Value goals: LDL < 70mg77mL > 40 mg.   Stress Yes   Intervention Offer individual and/or small group education and counseling on adjustment to heart disease, stress management and health-related lifestyle change. Teach and support self-help strategies.;Refer participants experiencing significant psychosocial distress to appropriate mental health specialists for further evaluation and treatment. When possible, include family members and significant others in education/counseling sessions.   Expected Outcomes Short Term: Participant demonstrates changes in health-related behavior, relaxation and other stress management skills, ability to obtain effective social support, and compliance with psychotropic medications if prescribed.;Long Term: Emotional wellbeing is indicated by absence of clinically significant psychosocial distress or social isolation.      Core Components/Risk Factors/Patient Goals Review:    Core Components/Risk Factors/Patient Goals at Discharge (Final Review):    ITP Comments:     ITP Comments    Row Name 11/27/16 0920           ITP  Comments Dr. Fransico Him, Medical Director          Comments:  Pt is making expected progress toward personal goals after completing  10 sessions.Pt is doing very well with exercise. Pt has been able to tolerate exercise without the use of O2 and is  able to maintain o2 sats 97-98 with no shortness of breath. Psychosocial Assessment supportive family, daughter is RT at [facility].  Pt with some health related concerns due to advanced age and large number of medical issues.  Pt feels hopeful and is excited she has been able to get off the O2. Recommend continued exercise and life style modification education including  stress management and relaxation techniques to decrease cardiac risk profile. Cherre Huger, BSN

## 2016-12-26 ENCOUNTER — Encounter (HOSPITAL_COMMUNITY)
Admission: RE | Admit: 2016-12-26 | Discharge: 2016-12-26 | Disposition: A | Discharge status: Home / Self Care | Payer: Medicare Other | Source: Ambulatory Visit | Attending: Cardiovascular Disease | Admitting: Cardiovascular Disease

## 2016-12-26 VITALS — Wt 213.0 lb

## 2016-12-26 DIAGNOSIS — Z952 Presence of prosthetic heart valve: Secondary | ICD-10-CM

## 2016-12-26 LAB — GLUCOSE, CAPILLARY: Glucose-Capillary: 181 mg/dL — ABNORMAL HIGH (ref 65–99)

## 2016-12-29 ENCOUNTER — Other Ambulatory Visit: Payer: Self-pay | Admitting: Cardiovascular Disease

## 2016-12-29 ENCOUNTER — Telehealth (HOSPITAL_COMMUNITY): Payer: Self-pay | Admitting: *Deleted

## 2016-12-29 ENCOUNTER — Telehealth: Payer: Self-pay | Admitting: *Deleted

## 2016-12-29 ENCOUNTER — Encounter (HOSPITAL_COMMUNITY)
Admission: RE | Admit: 2016-12-29 | Discharge: 2016-12-29 | Disposition: A | Discharge status: Home / Self Care | Payer: Medicare Other | Source: Ambulatory Visit | Attending: Cardiovascular Disease | Admitting: Cardiovascular Disease

## 2016-12-29 ENCOUNTER — Other Ambulatory Visit: Payer: Self-pay | Admitting: *Deleted

## 2016-12-29 DIAGNOSIS — I10 Essential (primary) hypertension: Secondary | ICD-10-CM

## 2016-12-29 MED ORDER — DOXAZOSIN MESYLATE 4 MG PO TABS: ORAL_TABLET | ORAL | 3 refills | Status: DC

## 2016-12-29 MED ORDER — AMIODARONE HCL 200 MG PO TABS: ORAL_TABLET | ORAL | 3 refills | Status: DC

## 2016-12-29 NOTE — Telephone Encounter (Signed)
[facility] Mail order

## 2016-12-29 NOTE — Telephone Encounter (Signed)
Spoke with receptionist with Cardiac Rehab. She will give message to [facility].

## 2016-12-29 NOTE — Progress Notes (Signed)
Pt presents to cardiac rehab today for exercise. Pt feels tired and weak.  Pt stated that her HGB was 7.4.  Reviewed lab work in Dentist. Per epic lab work completed on 2/27 with HGB 7.4. Pt has gi consult on Wednesday.  Pt advised that she will not be exercising today.  Dr,. [place] office called.  Able to leave a message for the nurse on answering machine.  Will keep pt here in rehab until I hear back from the office for further instructions. BP 150/60, o2 sat 97 on RA, hr 73, cbg 133 at home. Cherre Huger, BSN

## 2016-12-29 NOTE — Telephone Encounter (Signed)
Received return message from [place] "nothing more needs to be done about HG 7.4 per MD."  Maurice Small RN, BSN

## 2016-12-29 NOTE — Telephone Encounter (Signed)
Nothing more needs to be done.

## 2016-12-29 NOTE — Progress Notes (Signed)
Did not receive call back from office.  Recalled and spoke to his nurse.  Dr. Nyoka Cowden is not in the office today he is rounding at a facility.  Dr. Nyoka Cowden nurse plans to reach out to MD and let him know about the labwork from 2/27.  Pt ok to go home and await further instructions.  Asked pt about continuance of black stools. Pt reports she always has them because of the iron.Pt previously instructed on 2/27 to increase iron intake to twice a day which pt did.  Pt has not noticed any acute bleeding and feels well enough to drive herself home. Pt advised to hold on exercise until the source of the bleeding is determined. Cherre Huger, BSN

## 2016-12-29 NOTE — Telephone Encounter (Signed)
Karlette with [facility] called and stated that patient was there for exercising but stated that she did not feel well and that her Hemoglobin was low at 7.4 on 2/27. Was instructed to take Iron twice daily and GI appointment was scheduled. She is going this Wednesday to the GI. They did not let her exercise today due to her not feeling well.  The nurse at [place] wants to know is there anything else that needs to be done more urgently leading up to the GI consult on Wednesday. Please Advise.

## 2016-12-31 ENCOUNTER — Encounter (HOSPITAL_COMMUNITY)
Admission: RE | Admit: 2016-12-31 | Discharge: 2016-12-31 | Disposition: A | Discharge status: Home / Self Care | Payer: Medicare Other | Source: Ambulatory Visit | Attending: Cardiovascular Disease | Admitting: Cardiovascular Disease

## 2016-12-31 DIAGNOSIS — Z7901 Long term (current) use of anticoagulants: Secondary | ICD-10-CM | POA: Diagnosis not present

## 2016-12-31 DIAGNOSIS — D500 Iron deficiency anemia secondary to blood loss (chronic): Secondary | ICD-10-CM | POA: Diagnosis not present

## 2016-12-31 DIAGNOSIS — R195 Other fecal abnormalities: Secondary | ICD-10-CM | POA: Diagnosis not present

## 2016-12-31 DIAGNOSIS — R1013 Epigastric pain: Secondary | ICD-10-CM | POA: Diagnosis not present

## 2017-01-01 ENCOUNTER — Telehealth (HOSPITAL_COMMUNITY): Payer: Self-pay | Admitting: *Deleted

## 2017-01-01 DIAGNOSIS — D649 Anemia, unspecified: Secondary | ICD-10-CM | POA: Diagnosis not present

## 2017-01-01 NOTE — Telephone Encounter (Signed)
Called to inquire about how gi consult went.  Left message on home phone.  Called and spoke to pt on cell phone.  Pt presently in [facility] receiving two units of PRBC.  Pt will need endoscopy and at this time unsure of when this will take place. No plans for repeat CBC as of yet.  Advised pt to continue to hold for exercise until the plan is determined. Pt verbalized understanding. Cherre Huger, BSN

## 2017-01-02 ENCOUNTER — Encounter (HOSPITAL_COMMUNITY): Payer: Medicare Other

## 2017-01-05 ENCOUNTER — Encounter (HOSPITAL_COMMUNITY): Payer: Medicare Other

## 2017-01-07 ENCOUNTER — Encounter (HOSPITAL_COMMUNITY): Payer: Medicare Other

## 2017-01-09 ENCOUNTER — Encounter (HOSPITAL_COMMUNITY): Admission: RE | Admit: 2017-01-09 | Payer: Medicare Other | Source: Ambulatory Visit

## 2017-01-12 ENCOUNTER — Encounter (HOSPITAL_COMMUNITY): Payer: Medicare Other

## 2017-01-14 ENCOUNTER — Encounter (HOSPITAL_COMMUNITY): Payer: Medicare Other

## 2017-01-14 ENCOUNTER — Telehealth: Payer: Self-pay

## 2017-01-14 NOTE — Telephone Encounter (Signed)
Received a request from [place] advised ok to hold Eliquis 3 days prior to endo.Request faxed back to fax # [phone].

## 2017-01-16 ENCOUNTER — Encounter (HOSPITAL_COMMUNITY): Payer: Medicare Other

## 2017-01-16 ENCOUNTER — Telehealth (HOSPITAL_COMMUNITY): Payer: Self-pay | Admitting: *Deleted

## 2017-01-16 ENCOUNTER — Telehealth: Payer: Self-pay | Admitting: Cardiology

## 2017-01-16 NOTE — Telephone Encounter (Signed)
Called to see if pt had gi procedure.  Pt hadn't due to waiting for clearance from [place].  This was faxed to Dr. Benay Spice office today by dr. [place] nurse.  Pt has upcoming appt with dr. Nyoka Cowden on 4/27.  Pt plans to ask for repeat cbc post transfusion.  Asked pt to please call with results.  Depending on the lab results pt may be able to resume exercise.  Pt verbalized understanding and is in agreement of this. Cherre Huger, BSN Cardiac and Training and development officer

## 2017-01-16 NOTE — Telephone Encounter (Signed)
Spoke to patient I refaxed clearance note to [facility] at fax # [phone]..Dr.Jordan's advised ok to hold Eliquis 3 days prior to endo.

## 2017-01-16 NOTE — Telephone Encounter (Signed)
Spoke to patient, who wanted to make sure a clearance to her GI office had been sent. Reviewed and informed patient per note this was sent on Wednesday. She states she called GI office yesterday and thinks they still have not received it. Informed her I'd forward to [place] to make sure this can be re-sent today. Patient also has been made aware of med hold instructions. She voiced thanks for call.

## 2017-01-16 NOTE — Telephone Encounter (Signed)
Patricia [name] is calling because she is to have a procedure and wants to know if you received her clearance. Please call

## 2017-01-19 ENCOUNTER — Encounter (HOSPITAL_COMMUNITY): Admission: RE | Admit: 2017-01-19 | Payer: Medicare Other | Source: Ambulatory Visit

## 2017-01-20 ENCOUNTER — Encounter: Payer: Self-pay | Admitting: Internal Medicine

## 2017-01-20 ENCOUNTER — Ambulatory Visit (INDEPENDENT_AMBULATORY_CARE_PROVIDER_SITE_OTHER): Payer: Medicare Other | Admitting: Internal Medicine

## 2017-01-20 VITALS — BP 160/68 | HR 69 | Temp 97.9°F | Ht 63.0 in | Wt 213.0 lb

## 2017-01-20 DIAGNOSIS — J449 Chronic obstructive pulmonary disease, unspecified: Secondary | ICD-10-CM

## 2017-01-20 DIAGNOSIS — I5032 Chronic diastolic (congestive) heart failure: Secondary | ICD-10-CM | POA: Diagnosis not present

## 2017-01-20 DIAGNOSIS — E119 Type 2 diabetes mellitus without complications: Secondary | ICD-10-CM

## 2017-01-20 DIAGNOSIS — I10 Essential (primary) hypertension: Secondary | ICD-10-CM

## 2017-01-20 DIAGNOSIS — D7589 Other specified diseases of blood and blood-forming organs: Secondary | ICD-10-CM | POA: Diagnosis not present

## 2017-01-20 DIAGNOSIS — D649 Anemia, unspecified: Secondary | ICD-10-CM

## 2017-01-20 DIAGNOSIS — N393 Stress incontinence (female) (male): Secondary | ICD-10-CM | POA: Diagnosis not present

## 2017-01-20 DIAGNOSIS — R7989 Other specified abnormal findings of blood chemistry: Secondary | ICD-10-CM | POA: Diagnosis not present

## 2017-01-20 LAB — CBC WITH DIFFERENTIAL/PLATELET
Basophils Absolute: 60 cells/uL (ref 0–200)
Basophils Relative: 1 %
Eosinophils Absolute: 120 cells/uL (ref 15–500)
Eosinophils Relative: 2 %
HCT: 30.3 % — ABNORMAL LOW (ref 35.0–45.0)
Hemoglobin: 9.5 g/dL — ABNORMAL LOW (ref 11.7–15.5)
Lymphocytes Relative: 22 %
Lymphs Abs: 1320 cells/uL (ref 850–3900)
MCH: 27.6 pg (ref 27.0–33.0)
MCHC: 31.4 g/dL — ABNORMAL LOW (ref 32.0–36.0)
MCV: 88.1 fL (ref 80.0–100.0)
MPV: 9.9 fL (ref 7.5–12.5)
Monocytes Absolute: 540 cells/uL (ref 200–950)
Monocytes Relative: 9 %
Neutro Abs: 3960 cells/uL (ref 1500–7800)
Neutrophils Relative %: 66 %
Platelets: 284 10*3/uL (ref 140–400)
RBC: 3.44 MIL/uL — ABNORMAL LOW (ref 3.80–5.10)
RDW: 16.3 % — ABNORMAL HIGH (ref 11.0–15.0)
WBC: 6 10*3/uL (ref 3.8–10.8)

## 2017-01-20 LAB — IRON AND TIBC
%SAT: 9 % — ABNORMAL LOW (ref 11–50)
Iron: 34 ug/dL — ABNORMAL LOW (ref 45–160)
TIBC: 369 ug/dL (ref 250–450)
UIBC: 335 ug/dL (ref 125–400)

## 2017-01-20 LAB — VITAMIN B12: Vitamin B-12: 389 pg/mL (ref 200–1100)

## 2017-01-20 LAB — FOLATE: Folate: 11.2 ng/mL (ref >5.4)

## 2017-01-20 LAB — RETICULOCYTES
ABS Retic: 44720 cells/uL (ref 20000–80000)
RBC.: 3.44 MIL/uL — ABNORMAL LOW (ref 3.80–5.10)
Retic Ct Pct: 1.3 %

## 2017-01-20 MED ORDER — DOXAZOSIN MESYLATE 4 MG PO TABS: ORAL_TABLET | ORAL | 3 refills | Status: DC

## 2017-01-20 MED ORDER — GLIMEPIRIDE 2 MG PO TABS: ORAL_TABLET | ORAL | 3 refills | Status: DC

## 2017-01-20 MED ORDER — METFORMIN HCL 500 MG PO TABS: 500.0000 mg | ORAL_TABLET | Freq: Two times a day (BID) | ORAL | 3 refills | Status: DC

## 2017-01-20 MED ORDER — ALBUTEROL SULFATE HFA 108 (90 BASE) MCG/ACT IN AERS: 2.0000 | INHALATION_SPRAY | Freq: Four times a day (QID) | RESPIRATORY_TRACT | 11 refills | Status: DC | PRN

## 2017-01-20 MED ORDER — AMIODARONE HCL 200 MG PO TABS: ORAL_TABLET | ORAL | 3 refills | Status: DC

## 2017-01-20 MED ORDER — FLUTICASONE FUROATE-VILANTEROL 100-25 MCG/INH IN AEPB: INHALATION_SPRAY | RESPIRATORY_TRACT | 4 refills | Status: DC

## 2017-01-20 MED ORDER — MIRABEGRON ER 25 MG PO TB24: ORAL_TABLET | ORAL | 5 refills | Status: DC

## 2017-01-20 NOTE — Progress Notes (Signed)
Facility  [facility]    Place of Service:   OFFICE    Allergies  Allergen Reactions  . Codeine Other (See Comments)    "spaces her out, dizziness, can't walk well"  . Vesicare [Solifenacin] Itching    Chief Complaint  Patient presents with  . Medical Management of Chronic Issues    2 month medication management of blood sugar, blood pressure, CHF, anemia.     HPI:  Macrocytosis - Anemic. needs Vitamin B12 and Folate levels  Essential hypertension - continue current meds. Has a mild elevation of the SBP  Type 2 diabetes mellitus without complication, without long-term current use of insulin (HCC) - controlled  Chronic diastolic CHF (congestive heart failure) ([place]) - compensated  Anemia, unspecified type - persistent. Seems like iron deficiency. Not responding to iron supplement.  Chronic obstructive pulmonary disease, unspecified COPD type ([place]) - needs to changed to : fluticasone furoate-vilanterol (BREO ELLIPTA) 100-25 MCG/INH AEPB, albuterol (PROVENTIL HFA;VENTOLIN HFA) 108 (90 Base) MCG/ACT inhaler due to insurance coverage  Stress incontinence of urine - failed to imrpove on oxybutynin or Vesicare    Medications: Patient's Medications  New Prescriptions   No medications on file  Previous Medications   ACETAMINOPHEN (TYLENOL) 500 MG TABLET    Take 500 mg by mouth every 6 (six) hours as needed for mild pain.   ADVAIR DISKUS 100-50 MCG/DOSE AEPB    Inhale 1 inhalation two  times daily for breathing   ALBUTEROL (PROVENTIL HFA;VENTOLIN HFA) 108 (90 BASE) MCG/ACT INHALER    Inhale 2 puffs into the lungs every 6 (six) hours as needed for wheezing or shortness of breath.   ALBUTEROL (PROVENTIL) (2.5 MG/3ML) 0.083% NEBULIZER SOLUTION    Take 3 mLs (2.5 mg total) by nebulization every 4 (four) hours. And as needed   AMIODARONE (PACERONE) 200 MG TABLET    Take one tablet daily by mouth   APIXABAN (ELIQUIS) 5 MG TABS TABLET    Take 1 tablet (5 mg total) by mouth 2 (two) times  daily.   CETIRIZINE (ZYRTEC) 10 MG TABLET    Take 10 mg by mouth daily as needed for allergies.    CHLORHEXIDINE (PERIDEX) 0.12 % SOLUTION    Rinse with 15 mls twice daily for 30 seconds. Use after breakfast and at bedtime. Spit out excess. Do not swallow.   CHOLECALCIFEROL (VITAMIN D) 1000 UNITS TABLET    Take 1,000 Units by mouth daily.   COMFORT LANCETS MISC    Use daily to get blood for glucometer   DOXAZOSIN (CARDURA) 4 MG TABLET    Take one tablet by mouth once daily to help control blood pressure   FERROUS SULFATE 325 (65 FE) MG EC TABLET    Take 325 mg by mouth 2 (two) times daily.   FUROSEMIDE (LASIX) 40 MG TABLET    Take 1 tablet (40 mg total) by mouth daily.   GLIMEPIRIDE (AMARYL) 2 MG TABLET    TAKE 1 TABLET BY MOUTH AT  BREAKFAST TO CONTROL  GLUCOSE   GLUCOSE BLOOD (BAYER CONTOUR TEST) TEST STRIP    Use daily too check glucose   KETOTIFEN FUMARATE (ALLERGY EYE DROPS OP)    Apply 1 drop to eye daily as needed (allergies).   LEVOTHYROXINE (SYNTHROID, LEVOTHROID) 50 MCG TABLET    Take 1 tablet by mouth  daily for thyroid  supplement   LISINOPRIL (PRINIVIL,ZESTRIL) 10 MG TABLET    Take 1 tablet (10 mg total) by mouth daily.   METFORMIN (  GLUCOPHAGE) 500 MG TABLET    Take 500 mg by mouth 2 (two) times daily with a meal.   PANTOPRAZOLE (PROTONIX) 40 MG TABLET    Take 1 tablet (40 mg total) by mouth 2 (two) times daily.   POTASSIUM GLUCONATE 595 (99 K) MG TABS TABLET    Take 595 mg by mouth daily. TAKES ONLY WHEN USING FUROSEMIDE   PRAVASTATIN (PRAVACHOL) 20 MG TABLET    TAKE 1 TABLET BY MOUTH  DAILY   TRIAMCINOLONE (KENALOG) 0.025 % CREAM    Apply 1 application topically daily as needed (psoriasis).   Modified Medications   No medications on file  Discontinued Medications   No medications on file    Review of Systems  Constitutional: Negative for activity change, appetite change, chills, diaphoresis, fatigue, fever and unexpected weight change.  HENT: Positive for postnasal drip.  Negative for congestion, ear discharge, ear pain, hearing loss, rhinorrhea, sore throat, tinnitus, trouble swallowing and voice change.   Eyes: Negative for pain, redness, itching and visual disturbance.  Respiratory: Negative for cough, choking, shortness of breath and wheezing.   Cardiovascular: Negative for chest pain, palpitations and leg swelling.       Sever Aortic valve stenosis  Gastrointestinal: Negative for abdominal distention, abdominal pain, constipation, diarrhea and nausea.  Endocrine: Negative for cold intolerance, heat intolerance, polydipsia, polyphagia and polyuria.       Diabetic. Hypothyroid.  Genitourinary: Negative for difficulty urinating, dysuria, flank pain, frequency, hematuria, pelvic pain, urgency and vaginal discharge.  Musculoskeletal: Positive for arthralgias and back pain. Negative for gait problem, myalgias, neck pain and neck stiffness.       Post right shoulder arthroplasty in Feb. 2017. Has seen Dr. Eddie Dibbles about pain in the right knee. Got injection but it did not help. He talked about using a gel injection. Knee gives out sometimes. Using a cane at times. Recurrent problems with mild discomfort.  Skin: Negative for color change, pallor and rash.  Allergic/Immunologic: Negative.   Neurological: Negative for dizziness, tremors, seizures, syncope, weakness, numbness and headaches.  Hematological: Negative for adenopathy. Does not bruise/bleed easily.       Anemic on lab in October 2016. Had EGD and colonoscopy without discovery of etiology.  Psychiatric/Behavioral: Negative.  Negative for agitation, behavioral problems, confusion, dysphoric mood, hallucinations, sleep disturbance and suicidal ideas. The patient is not nervous/anxious and is not hyperactive.     Vitals:   01/20/17 1409  BP: (!) 160/68  Pulse: 69  Temp: 97.9 F (36.6 C)  TempSrc: Oral  SpO2: 96%  Weight: 213 lb (96.6 kg)  Height: _0  (1.6 m)   Body mass index is 37.73 kg/m. Wt  Readings from Last 3 Encounters:  01/20/17 213 lb (96.6 kg)  12/23/16 215 lb (97.5 kg)  11/27/16 217 lb 6 oz (98.6 kg)      Physical Exam  Constitutional: She is oriented to person, place, and time. No distress.  Morbid obesity  HENT:  Head: Normocephalic and atraumatic.  Right Ear: External ear normal.  Left Ear: External ear normal.  Nose: Nose normal.  Mouth/Throat: Oropharynx is clear and moist.  Right EAC partially occluded with cerumen. Following removal right TM retraction noted.  Eyes: Conjunctivae and EOM are normal.  Neck: Normal range of motion. Neck supple. No JVD present. No tracheal deviation present. No thyromegaly present.  Cardiovascular: Normal rate, regular rhythm and intact distal pulses.  Exam reveals no gallop and no friction rub.   Murmur (2/6 SEM at the aortic  area and LSB) heard. 3/6 aortic ejection murmur  Pulmonary/Chest: No respiratory distress. She has no wheezes. She exhibits no tenderness.  Abdominal: Bowel sounds are normal. She exhibits no distension and no mass. There is no tenderness.  Musculoskeletal: Normal range of motion. She exhibits edema. She exhibits no tenderness.  Pain in the both shoulders (R>L) with rotational movement. Pain in both knees (R>L).  Lymphadenopathy:    She has no cervical adenopathy.  Neurological: She is alert and oriented to person, place, and time. She has normal reflexes. No cranial nerve deficit. Coordination normal.  Skin: No rash noted. No erythema. No pallor.  Psychiatric: She has a normal mood and affect. Judgment and thought content normal.    Labs reviewed: Lab Summary Latest Ref Rng & Units 12/23/2016  Hemoglobin 11.7 - 15.5 g/dL 7.4(L)  Hematocrit 35.0 - 45.0 % 24.0(L)  White count 3.8 - 10.8 K/uL 5.7  Platelet count 140 - 400 K/uL 261  Sodium 135 - 146 mmol/L 137  Potassium 3.5 - 5.3 mmol/L 4.9  Calcium 8.6 - 10.4 mg/dL 9.0  Phosphorus - (None)  Creatinine 0.60 - 0.93 mg/dL 1.44(H)  AST 10 - 35 U/L  20  Alk Phos 33 - 130 U/L 58  Bilirubin 0.2 - 1.2 mg/dL 0.4  Glucose 65 - 99 mg/dL 143(H)  Cholesterol - (None)  HDL cholesterol - (None)  Triglycerides - (None)  LDL Direct - (None)  LDL Calc - (None)  Total protein 6.1 - 8.1 g/dL 6.9  Albumin 3.6 - 5.1 g/dL 3.6  Some recent data might be hidden   Lab Results  Component Value Date   TSH 1.297 09/12/2016   TSH 6.030 (H) 04/28/2016   TSH 4.09 04/07/2016   Lab Results  Component Value Date   BUN 26 (H) 12/23/2016   BUN 29 (H) 10/16/2016   BUN 23 (H) 10/15/2016   Lab Results  Component Value Date   HGBA1C 6.8 (H) 10/10/2016   HGBA1C 6.8 (H) 04/28/2016   HGBA1C 7.2 (H) 12/07/2015    Assessment/Plan  1. Essential hypertension -Continue lisinopril - doxazosin (CARDURA) 4 MG tablet; Take one tablet by mouth once daily to help control blood pressure  Dispense: 90 tablet; Refill: 3  2. Type 2 diabetes mellitus without complication, without long-term current use of insulin (HCC) The current medical regimen is effective;  continue present plan and medications. - glimepiride (AMARYL) 2 MG tablet; TAKE 1 TABLET BY MOUTH AT  BREAKFAST TO CONTROL  GLUCOSE  Dispense: 90 tablet; Refill: 3  3. Chronic diastolic CHF (congestive heart failure) (HCC) compensated  4. Anemia, unspecified type - CBC with Differential/Platelets - Reticulocytes - Iron and TIBC - Vitamin B12 - Folate  5. Macrocytosis - Vitamin B12 - Folate  6. Chronic obstructive pulmonary disease, unspecified COPD type (HCC) - fluticasone furoate-vilanterol (BREO ELLIPTA) 100-25 MCG/INH AEPB; Inhale into the lungs once daily  Dispense: 1 each; Refill: 4 - albuterol (PROVENTIL HFA;VENTOLIN HFA) 108 (90 Base) MCG/ACT inhaler; Inhale 2 puffs into the lungs every 6 (six) hours as needed for wheezing or shortness of breath.  Dispense: 1 Inhaler; Refill: 11  7. Stress incontinence of urine - mirabegron ER (MYRBETRIQ) 25 MG TB24 tablet; One daily to help bladder control   Dispense: 90 tablet; Refill: 5

## 2017-01-21 ENCOUNTER — Encounter (HOSPITAL_COMMUNITY): Payer: Self-pay | Admitting: *Deleted

## 2017-01-21 ENCOUNTER — Encounter (HOSPITAL_COMMUNITY): Payer: Medicare Other

## 2017-01-21 NOTE — Progress Notes (Signed)
Cardiac Individual Treatment Plan  Patient Details  Name: Patricia [name] MRN: [id number] Date of Birth: 01-23-1936 Referring Provider:     CARDIAC REHAB PHASE II ORIENTATION from 11/27/2016 in [place]  Referring Provider  Sherren Mocha MD      Initial Encounter Date:    CARDIAC REHAB PHASE II ORIENTATION from 11/27/2016 in [place]  Date  11/27/16  Referring Provider  Sherren Mocha MD      Visit Diagnosis: No diagnosis found.  Patient's Home Medications on Admission:  Current Outpatient Prescriptions:  .  acetaminophen (TYLENOL) 500 MG tablet, Take 500 mg by mouth every 6 (six) hours as needed for mild pain., Disp: , Rfl:  .  ADVAIR DISKUS 100-50 MCG/DOSE AEPB, Inhale 1 inhalation two  times daily for breathing, Disp: 180 each, Rfl: 3 .  albuterol (PROVENTIL HFA;VENTOLIN HFA) 108 (90 Base) MCG/ACT inhaler, Inhale 2 puffs into the lungs every 6 (six) hours as needed for wheezing or shortness of breath., Disp: 1 Inhaler, Rfl: 11 .  albuterol (PROVENTIL) (2.5 MG/3ML) 0.083% nebulizer solution, Take 3 mLs (2.5 mg total) by nebulization every 4 (four) hours. And as needed, Disp: 240 mL, Rfl: 3 .  amiodarone (PACERONE) 200 MG tablet, Take one tablet daily by mouth, Disp: 90 tablet, Rfl: 3 .  apixaban (ELIQUIS) 5 MG TABS tablet, Take 1 tablet (5 mg total) by mouth 2 (two) times daily., Disp: 60 tablet, Rfl: 3 .  cetirizine (ZYRTEC) 10 MG tablet, Take 10 mg by mouth daily as needed for allergies. , Disp: , Rfl:  .  chlorhexidine (PERIDEX) 0.12 % solution, Rinse with 15 mls twice daily for 30 seconds. Use after breakfast and at bedtime. Spit out excess. Do not swallow., Disp: 480 mL, Rfl: prn .  cholecalciferol (VITAMIN D) 1000 units tablet, Take 1,000 Units by mouth daily., Disp: , Rfl:  .  COMFORT LANCETS MISC, Use daily to get blood for glucometer, Disp: 100 each, Rfl: 3 .  doxazosin (CARDURA) 4 MG tablet, Take one tablet  by mouth once daily to help control blood pressure, Disp: 90 tablet, Rfl: 3 .  ferrous sulfate 325 (65 FE) MG EC tablet, Take 325 mg by mouth 2 (two) times daily., Disp: , Rfl:  .  fluticasone furoate-vilanterol (BREO ELLIPTA) 100-25 MCG/INH AEPB, Inhale into the lungs once daily, Disp: 1 each, Rfl: 4 .  furosemide (LASIX) 40 MG tablet, Take 1 tablet (40 mg total) by mouth daily., Disp: 90 tablet, Rfl: 1 .  glimepiride (AMARYL) 2 MG tablet, TAKE 1 TABLET BY MOUTH AT  BREAKFAST TO CONTROL  GLUCOSE, Disp: 90 tablet, Rfl: 3 .  glucose blood (BAYER CONTOUR TEST) test strip, Use daily too check glucose, Disp: 100 each, Rfl: 4 .  Ketotifen Fumarate (ALLERGY EYE DROPS OP), Apply 1 drop to eye daily as needed (allergies)., Disp: , Rfl:  .  levothyroxine (SYNTHROID, LEVOTHROID) 50 MCG tablet, Take 1 tablet by mouth  daily for thyroid  supplement, Disp: 90 tablet, Rfl: 1 .  lisinopril (PRINIVIL,ZESTRIL) 10 MG tablet, Take 1 tablet (10 mg total) by mouth daily., Disp: 30 tablet, Rfl: 3 .  metFORMIN (GLUCOPHAGE) 500 MG tablet, Take 1 tablet (500 mg total) by mouth 2 (two) times daily with a meal., Disp: 180 tablet, Rfl: 3 .  mirabegron ER (MYRBETRIQ) 25 MG TB24 tablet, One daily to help bladder control, Disp: 90 tablet, Rfl: 5 .  pantoprazole (PROTONIX) 40 MG tablet, Take 1 tablet (40  mg total) by mouth 2 (two) times daily., Disp: 180 tablet, Rfl: 0 .  potassium gluconate 595 (99 K) MG TABS tablet, Take 595 mg by mouth daily. TAKES ONLY WHEN USING FUROSEMIDE, Disp: , Rfl:  .  pravastatin (PRAVACHOL) 20 MG tablet, TAKE 1 TABLET BY MOUTH  DAILY, Disp: 90 tablet, Rfl: 1 .  triamcinolone (KENALOG) 0.025 % cream, Apply 1 application topically daily as needed (psoriasis). , Disp: , Rfl:   Past Medical History: Past Medical History:  Diagnosis Date  . Allergy   . Anemia, unspecified   . Arthritis   . Asthma   . Atrial fibrillation status post cardioversion [facility]) 09/2015   s/p TEE/DCCV>>SR on amio  . Benign  neoplasm of colon   . Carpal tunnel syndrome   . Cellulitis and abscess of finger, unspecified   . Cerumen impaction   . Cervicalgia   . CHF (congestive heart failure) ([place])   . Chronic airway obstruction, not elsewhere classified   . Chronic anticoagulation 09/2015   Xarelto for afib, CHADS2VASC=5  . Diarrhea   . Diffuse cystic mastopathy   . Dysrhythmia   . Edema   . Encounter for long-term (current) use of other medications   . GERD (gastroesophageal reflux disease)   . Heart murmur   . Lumbago   . Neck pain 05/23/2015  . Obesity, unspecified   . Other and unspecified hyperlipidemia   . Other psoriasis   . Pain in joint, lower leg   . PONV (postoperative nausea and vomiting)   . Primary localized osteoarthrosis of right shoulder 12/18/2015  . Routine gynecological examination   . 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 Scoliosis (and kyphoscoliosis), idiopathic   . Severe aortic stenosis   . Shortness of breath dyspnea    with exertion  . Type II or unspecified type diabetes mellitus without mention of complication, uncontrolled   . Unspecified essential hypertension   . Unspecified hemorrhoids without mention of complication   . Unspecified hypothyroidism   . Urge incontinence     Tobacco Use: History  Smoking Status  . Former Smoker  . Packs/day: 1.00  . Years: 40.00  . Quit date: 09/07/1989  Smokeless Tobacco  . Never Used    Labs: Recent Review Flowsheet Data    Labs for ITP Cardiac and Pulmonary Rehab Latest Ref Rng & Units 10/14/2016 10/14/2016 10/14/2016 10/14/2016 10/14/2016   Cholestrol 100 - 199 mg/dL - - - - -   LDLCALC 0 - 99 mg/dL - - - - -   HDL >39 mg/dL - - - - -   Trlycerides 0 - 149 mg/dL - - - - -   Hemoglobin A1c 4.8 - 5.6 % - - - - -   PHART 7.350 - 7.450 - 7.401 7.403 7.365 7.385   PCO2ART 32.0 - 48.0 mmHg -  49.7(H) 50.0(H) 55.0(H) 51.9(H)   HCO3 20.0 - 28.0 mmol/L - 30.8(H) 31.6(H) 31.8(H) 31.4(H)   TCO2 0 - 100 mmol/L 29 32 33 34 33   O2SAT % - 99.0 98.0 97.0 96.0      Capillary Blood Glucose: Lab Results  Component Value Date   GLUCAP 181 (H) 12/26/2016   GLUCAP 114 (H) 12/17/2016   GLUCAP 145 (H) 12/08/2016   GLUCAP 117 (H) 12/05/2016   GLUCAP 110 (H) 12/05/2016     Exercise Target Goals:    Exercise Program Goal:  Individual exercise prescription set with THRR, safety & activity barriers. Participant demonstrates ability to understand and report RPE using BORG scale, to self-measure pulse accurately, and to acknowledge the importance of the exercise prescription.  Exercise Prescription Goal: Starting with aerobic activity 30 plus minutes a day, 3 days per week for initial exercise prescription. Provide home exercise prescription and guidelines that participant acknowledges understanding prior to discharge.  Activity Barriers & Risk Stratification:     Activity Barriers & Cardiac Risk Stratification - 11/27/16 0937      Activity Barriers & Cardiac Risk Stratification   Activity Barriers Joint Problems;Deconditioning;Muscular Weakness;Shortness of Breath;Arthritis;Other (comment)   Comments R shoulder replacement, B knee pain and R-sided hip/sciatica pain      6 Minute Walk:     6 Minute Walk    Row Name 11/27/16 0938 11/27/16 1031 11/27/16 1228     6 Minute Walk   Phase Initial  -  -   Distance 400 feet  -  -   Walk Time 2.16 minutes  -  -   # of Rest Breaks 1  pt stopped _0 .16 and did not want to continue  -  -   MPH  -  - 2.1   METS  -  - 0.35   RPE 13  -  -   VO2 Peak  -  - 1.23   Symptoms Yes (comment)  -  -   Comments 9/10 R knee pain , 3/10 R hip pain 9/10 R knee pain , 3/10 R hip pain- pt did not continue with walk test due to hip/knee pain  -   Resting HR 63 bpm  -  -   Resting BP 150/60  -  -   Max Ex. HR 89 bpm  -  -   Max Ex. BP 162/64  -  -   2  Minute Post BP 152/66  -  -      Oxygen Initial Assessment:   Oxygen Re-Evaluation:   Oxygen Discharge (Final Oxygen Re-Evaluation):   Initial Exercise Prescription:     Initial Exercise Prescription - 11/27/16 1200      Date of Initial Exercise RX and Referring Provider   Date 11/27/16   Referring Provider Sherren Mocha MD     Recumbant Bike   Level --   Minutes --   METs --     NuStep   Level 1   Minutes 20   METs 1.3     Arm Ergometer   Level 1   Minutes 10   METs 1     Prescription Details   Frequency (times per week) 3   Duration Progress to 30 minutes of continuous aerobic without signs/symptoms of physical distress     Intensity   THRR 40-80% of Max Heartrate 57-114   Ratings of Perceived Exertion 11-13   Perceived Dyspnea 0-4     Progression   Progression Continue progressive overload as per policy without signs/symptoms or physical distress.     Resistance Training   Training Prescription Yes   Weight 1lb   Reps 10-12      Perform Capillary Blood Glucose checks as needed.  Exercise Prescription Changes:     Exercise Prescription Changes    Row Name 12/23/16 1200 12/26/16 1542           Response to Exercise   Blood Pressure (Admit) 124/62 130/56      Blood Pressure (Exercise) 124/70 144/58      Blood  Pressure (Exit) 110/62 122/58      Heart Rate (Admit) 75 bpm 78 bpm      Heart Rate (Exercise) 85 bpm 88 bpm      Heart Rate (Exit) 75 bpm 77 bpm      Rating of Perceived Exertion (Exercise) 13 13      Symptoms none none      Comments pt has d/c O2 and SpO2 has remained over 90% with exercise  -      Duration Continue with 30 min of aerobic exercise without signs/symptoms of physical distress. Continue with 30 min of aerobic exercise without signs/symptoms of physical distress.      Intensity THRR unchanged THRR unchanged        Progression   Progression Continue to progress workloads to maintain intensity without signs/symptoms  of physical distress. Continue to progress workloads to maintain intensity without signs/symptoms of physical distress.      Average METs 2.1 2.1        Resistance Training   Training Prescription Yes Yes      Weight 2lbs 2kbs      Reps 10-15 10-15      Time 10 Minutes 10 Minutes        NuStep   Level 3 3      Minutes 20 20      METs 2 1.9        Arm Ergometer   Level 1 1      Minutes 10 10      METs 2.3 2.35        Home Exercise Plan   Plans to continue exercise at Home (comment) Home (comment)      Frequency Add 2 additional days to program exercise sessions. Add 3 additional days to program exercise sessions.      Initial Home Exercises Provided 12/10/16 12/10/16         Exercise Comments:     Exercise Comments    Row Name 12/23/16 1454           Exercise Comments Reviewed METs and goals. Pt is tolerating exercise well; will continue to monitor exercise progression.           Exercise Goals and Review:     Exercise Goals    Row Name 12/23/16 1451             Exercise Goals   Increase Physical Activity Yes       Intervention Provide advice, education, support and counseling about physical activity/exercise needs.;Develop an individualized exercise prescription for aerobic and resistive training based on initial evaluation findings, risk stratification, comorbidities and participant's personal goals.       Expected Outcomes Achievement of increased cardiorespiratory fitness and enhanced flexibility, muscular endurance and strength shown through measurements of functional capacity and personal statement of participant.       Increase Strength and Stamina Yes       Intervention Provide advice, education, support and counseling about physical activity/exercise needs.;Develop an individualized exercise prescription for aerobic and resistive training based on initial evaluation findings, risk stratification, comorbidities and participant's personal goals.        Expected Outcomes Achievement of increased cardiorespiratory fitness and enhanced flexibility, muscular endurance and strength shown through measurements of functional capacity and personal statement of participant.          Exercise Goals Re-Evaluation :     Exercise Goals Re-Evaluation    Row Name 12/23/16 1451  Exercise Goal Re-Evaluation   Exercise Goals Review Increase Physical Activity       Comments Pt stated "functional mobility is gradually improving"       Expected Outcomes Pt will continue to progress in exercise tolerance and functional mobility           Discharge Exercise Prescription (Final Exercise Prescription Changes):     Exercise Prescription Changes - 12/26/16 1542      Response to Exercise   Blood Pressure (Admit) 130/56   Blood Pressure (Exercise) 144/58   Blood Pressure (Exit) 122/58   Heart Rate (Admit) 78 bpm   Heart Rate (Exercise) 88 bpm   Heart Rate (Exit) 77 bpm   Rating of Perceived Exertion (Exercise) 13   Symptoms none   Duration Continue with 30 min of aerobic exercise without signs/symptoms of physical distress.   Intensity THRR unchanged     Progression   Progression Continue to progress workloads to maintain intensity without signs/symptoms of physical distress.   Average METs 2.1     Resistance Training   Training Prescription Yes   Weight 2kbs   Reps 10-15   Time 10 Minutes     NuStep   Level 3   Minutes 20   METs 1.9     Arm Ergometer   Level 1   Minutes 10   METs 2.35     Home Exercise Plan   Plans to continue exercise at Home (comment)   Frequency Add 3 additional days to program exercise sessions.   Initial Home Exercises Provided 12/10/16      Nutrition:  Target Goals: Understanding of nutrition guidelines, daily intake of sodium <1558m, cholesterol <2039m calories 30% from fat and 7% or less from saturated fats, daily to have 5 or more servings of fruits and vegetables.  Biometrics:      Pre Biometrics - 11/27/16 1230      Pre Biometrics   % Body Fat 52.9 %       Nutrition Therapy Plan and Nutrition Goals:     Nutrition Therapy & Goals - 12/15/16 1330      Nutrition Therapy   Diet Carb Modified, Therapeutic Lifestyle Changes     Personal Nutrition Goals   Nutrition Goal Wt loss of 1-2 lb/week to a wt loss goal of 6-24 lb [place]    Intervention Plan   Intervention Prescribe, educate and counsel regarding individualized specific dietary modifications aiming towards targeted core components such as weight, hypertension, lipid management, diabetes, heart failure and other comorbidities.   Expected Outcomes Short Term Goal: Understand basic principles of dietary content, such as calories, fat, sodium, cholesterol and nutrients.;Long Term Goal: Adherence to prescribed nutrition plan.      Nutrition Discharge: Nutrition Scores:     Nutrition Assessments - 12/15/16 1330      MEDFICTS Scores   Pre Score 70      Nutrition Goals Re-Evaluation:   Nutrition Goals Re-Evaluation:   Nutrition Goals Discharge (Final Nutrition Goals Re-Evaluation):   Psychosocial: Target Goals: Acknowledge presence or absence of significant depression and/or stress, maximize coping skills, provide positive support system. Participant is able to verbalize types and ability to use techniques and skills needed for reducing stress and depression.  Initial Review & Psychosocial Screening:     Initial Psych Review & Screening - 12/01/16 1220      Family Dynamics   Good Support System? Yes     Barriers   Psychosocial barriers to participate  in program There are no identifiable barriers or psychosocial needs.      Quality of Life Scores:     Quality of Life - 11/27/16 1257      Quality of Life Scores   Health/Function Pre 19.27 %   Socioeconomic Pre 23.42 %   Psych/Spiritual Pre 22.5 %   Family Pre 30 %   GLOBAL Pre 22.05 %       PHQ-9: Recent Review Flowsheet Data    Depression screen [name] 2/9 12/01/2016 09/16/2016 01/23/2016 10/18/2015 07/18/2015   Decreased Interest 0 0 0 0 0   Down, Depressed, Hopeless 0 0 0 0 0   PHQ - 2 Score 0 0 0 0 0     Interpretation of Total Score  Total Score Depression Severity:  1-4 = Minimal depression, 5-9 = Mild depression, 10-14 = Moderate depression, 15-19 = Moderately severe depression, 20-27 = Severe depression   Psychosocial Evaluation and Intervention:     Psychosocial Evaluation - 12/24/16 1756      Psychosocial Evaluation & Interventions   Interventions Stress management education;Relaxation education;Encouraged to exercise with the program and follow exercise prescription   Comments good support at home, no needs or interventions neeeded   Continue Psychosocial Services  No Follow up required      Psychosocial Re-Evaluation:     Psychosocial Re-Evaluation    [place] Name 12/24/16 1756             Psychosocial Re-Evaluation   Current issues with None Identified       Interventions Encouraged to attend Cardiac Rehabilitation for the exercise       Continue Psychosocial Services  No Follow up required          Psychosocial Discharge (Final Psychosocial Re-Evaluation):     Psychosocial Re-Evaluation - 12/24/16 1756      Psychosocial Re-Evaluation   Current issues with None Identified   Interventions Encouraged to attend Cardiac Rehabilitation for the exercise   Continue Psychosocial Services  No Follow up required      Vocational Rehabilitation: Provide vocational rehab assistance to qualifying candidates.   Vocational Rehab Evaluation & Intervention:   Education: Education Goals: Education classes will be provided on a weekly basis, covering required topics. Participant will state understanding/return demonstration of topics presented.  Learning Barriers/Preferences:     Learning Barriers/Preferences - 11/27/16 5885      Learning  Barriers/Preferences   Learning Barriers Sight   Learning Preferences Skilled Demonstration;Verbal Instruction;Video;Written Material      Education Topics: Count Your Pulse:  -Group instruction provided by verbal instruction, demonstration, patient participation and written materials to support subject.  Instructors address importance of being able to find your pulse and how to count your pulse when at home without a heart monitor.  Patients get hands on experience counting their pulse with staff help and individually.   CARDIAC REHAB PHASE II EXERCISE from 12/26/2016 in [place]  Date  12/26/16  Educator  Maurice Small RN  Instruction Review Code  2- meets goals/outcomes      Heart Attack, Angina, and Risk Factor Modification:  -Group instruction provided by verbal instruction, video, and written materials to support subject.  Instructors address signs and symptoms of angina and heart attacks.    Also discuss risk factors for heart disease and how to make changes to improve heart health risk factors.   Functional Fitness:  -Group instruction provided by verbal instruction, demonstration, patient participation, and written materials to support  subject.  Instructors address safety measures for doing things around the house.  Discuss how to get up and down off the floor, how to pick things up properly, how to safely get out of a chair without assistance, and balance training.   Meditation and Mindfulness:  -Group instruction provided by verbal instruction, patient participation, and written materials to support subject.  Instructor addresses importance of mindfulness and meditation practice to help reduce stress and improve awareness.  Instructor also leads participants through a meditation exercise.    CARDIAC REHAB PHASE II EXERCISE from 12/26/2016 in [place]  Date  12/17/16  Educator  Jeanella Craze  Instruction Review  Code  2- meets goals/outcomes      Stretching for Flexibility and Mobility:  -Group instruction provided by verbal instruction, patient participation, and written materials to support subject.  Instructors lead participants through series of stretches that are designed to increase flexibility thus improving mobility.  These stretches are additional exercise for major muscle groups that are typically performed during regular warm up and cool down.   CARDIAC REHAB PHASE II EXERCISE from 12/26/2016 in [place]  Date  12/19/16  Instruction Review Code  2- meets goals/outcomes      Hands Only CPR Anytime:  -Group instruction provided by verbal instruction, video, patient participation and written materials to support subject.  Instructors co-teach with [facility] video for hands only CPR.  Participants get hands on experience with mannequins.   Nutrition I class: Heart Healthy Eating:  -Group instruction provided by PowerPoint slides, verbal discussion, and written materials to support subject matter. The instructor gives an explanation and review of the Therapeutic Lifestyle Changes diet recommendations, which includes a discussion on lipid goals, dietary fat, sodium, fiber, plant stanol/sterol esters, sugar, and the components of a well-balanced, healthy diet.   CARDIAC REHAB PHASE II EXERCISE from 12/26/2016 in [place]  Date  12/15/16  Educator  RD  Instruction Review Code  Not applicable [class handouts given]      Nutrition II class: Lifestyle Skills:  -Group instruction provided by PowerPoint slides, verbal discussion, and written materials to support subject matter. The instructor gives an explanation and review of label reading, grocery shopping for heart health, heart healthy recipe modifications, and ways to make healthier choices when eating out.   CARDIAC REHAB PHASE II EXERCISE from 12/26/2016 in [place]  Date  12/15/16  Educator  RD  Instruction Review Code  Not applicable [class handouts given]      Diabetes Question & Answer:  -Group instruction provided by PowerPoint slides, verbal discussion, and written materials to support subject matter. The instructor gives an explanation and review of diabetes co-morbidities, pre- and post-prandial blood glucose goals, pre-exercise blood glucose goals, signs, symptoms, and treatment of hypoglycemia and hyperglycemia, and foot care basics.   CARDIAC REHAB PHASE II EXERCISE from 12/26/2016 in [place]  Date  12/05/16  Educator  RD  Instruction Review Code  2- meets goals/outcomes      Diabetes Blitz:  -Group instruction provided by PowerPoint slides, verbal discussion, and written materials to support subject matter. The instructor gives an explanation and review of the physiology behind type 1 and type 2 diabetes, diabetes medications and rational behind using different medications, pre- and post-prandial blood glucose recommendations and Hemoglobin A1c goals, diabetes diet, and exercise including blood glucose guidelines for exercising safely.  CARDIAC REHAB PHASE II EXERCISE from 12/26/2016 in [place]  Date  12/15/16  Educator  RD  Instruction Review Code  Not applicable [class handouts given]      Portion Distortion:  -Group instruction provided by PowerPoint slides, verbal discussion, written materials, and food models to support subject matter. The instructor gives an explanation of serving size versus portion size, changes in portions sizes over the last 20 years, and what consists of a serving from each food group.   CARDIAC REHAB PHASE II EXERCISE from 12/26/2016 in [place]  Date  12/24/16  Educator  RD  Instruction Review Code  2- meets goals/outcomes      Stress Management:  -Group instruction provided by verbal  instruction, video, and written materials to support subject matter.  Instructors review role of stress in heart disease and how to cope with stress positively.     Exercising on Your Own:  -Group instruction provided by verbal instruction, power point, and written materials to support subject.  Instructors discuss benefits of exercise, components of exercise, frequency and intensity of exercise, and end points for exercise.  Also discuss use of nitroglycerin and activating EMS.  Review options of places to exercise outside of rehab.  Review guidelines for sex with heart disease.   Cardiac Drugs I:  -Group instruction provided by verbal instruction and written materials to support subject.  Instructor reviews cardiac drug classes: antiplatelets, anticoagulants, beta blockers, and statins.  Instructor discusses reasons, side effects, and lifestyle considerations for each drug class.   CARDIAC REHAB PHASE II EXERCISE from 12/26/2016 in [place]  Date  12/10/16  Instruction Review Code  2- meets goals/outcomes      Cardiac Drugs II:  -Group instruction provided by verbal instruction and written materials to support subject.  Instructor reviews cardiac drug classes: angiotensin converting enzyme inhibitors (ACE-I), angiotensin II receptor blockers (ARBs), nitrates, and calcium channel blockers.  Instructor discusses reasons, side effects, and lifestyle considerations for each drug class.   Anatomy and Physiology of the Circulatory System:  -Group instruction provided by verbal instruction, video, and written materials to support subject.  Reviews functional anatomy of heart, how it relates to various diagnoses, and what role the heart plays in the overall system.   CARDIAC REHAB PHASE II EXERCISE from 12/26/2016 in [facility]  Date  12/03/16  Instruction Review Code  2- meets goals/outcomes      Knowledge Questionnaire Score:      Knowledge Questionnaire Score - 11/27/16 1228      Knowledge Questionnaire Score   Pre Score 19/24      Core Components/Risk Factors/Patient Goals at Admission:     Personal Goals and Risk Factors at Admission - 11/27/16 1249      Core Components/Risk Factors/Patient Goals on Admission    Weight Management Yes;Obesity;Weight Loss   Intervention Weight Management: Develop a combined nutrition and exercise program designed to reach desired caloric intake, while maintaining appropriate intake of nutrient and fiber, sodium and fats, and appropriate energy expenditure required for the weight goal.;Weight Management: Provide education and appropriate resources to help participant work on and attain dietary goals.;Obesity: Provide education and appropriate resources to help participant work on and attain dietary goals.;Weight Management/Obesity: Establish reasonable short term and long term weight goals.   Expected Outcomes Short Term: Continue to assess and modify interventions until short term weight is achieved;Weight Maintenance: Understanding of the  daily nutrition guidelines, which includes 25-35% calories from fat, 7% or less cal from saturated fats, less than 242m cholesterol, less than 1.5gm of sodium, & 5 or more servings of fruits and vegetables daily;Long Term: Adherence to nutrition and physical activity/exercise program aimed toward attainment of established weight goal;Weight Loss: Understanding of general recommendations for a balanced deficit meal plan, which promotes 1-2 lb weight loss per week and includes a negative energy balance of [phone] kcal/d;Understanding recommendations for meals to include 15-35% energy as protein, 25-35% energy from fat, 35-60% energy from carbohydrates, less than 2053mof dietary cholesterol, 20-35 gm of total fiber daily;Understanding of distribution of calorie intake throughout the day with the consumption of 4-5 meals/snacks   Sedentary Yes    Intervention Provide advice, education, support and counseling about physical activity/exercise needs.;Develop an individualized exercise prescription for aerobic and resistive training based on initial evaluation findings, risk stratification, comorbidities and participant's personal goals.   Expected Outcomes Achievement of increased cardiorespiratory fitness and enhanced flexibility, muscular endurance and strength shown through measurements of functional capacity and personal statement of participant.   Increase Strength and Stamina Yes   Intervention Provide advice, education, support and counseling about physical activity/exercise needs.;Develop an individualized exercise prescription for aerobic and resistive training based on initial evaluation findings, risk stratification, comorbidities and participant's personal goals.   Expected Outcomes Achievement of increased cardiorespiratory fitness and enhanced flexibility, muscular endurance and strength shown through measurements of functional capacity and personal statement of participant.   Improve shortness of breath with ADL's Yes   Intervention Provide education, individualized exercise plan and daily activity instruction to help decrease symptoms of SOB with activities of daily living.   Expected Outcomes Short Term: Achieves a reduction of symptoms when performing activities of daily living.   Diabetes Yes   Intervention Provide education about signs/symptoms and action to take for hypo/hyperglycemia.;Provide education about proper nutrition, including hydration, and aerobic/resistive exercise prescription along with prescribed medications to achieve blood glucose in normal ranges: Fasting glucose 65-99 mg/dL   Expected Outcomes Short Term: Participant verbalizes understanding of the signs/symptoms and immediate care of hyper/hypoglycemia, proper foot care and importance of medication, aerobic/resistive exercise and nutrition plan for blood  glucose control.;Long Term: Attainment of HbA1C < 7%.   Hypertension Yes   Intervention Provide education on lifestyle modifcations including regular physical activity/exercise, weight management, moderate sodium restriction and increased consumption of fresh fruit, vegetables, and low fat dairy, alcohol moderation, and smoking cessation.;Monitor prescription use compliance.   Expected Outcomes Short Term: Continued assessment and intervention until BP is < 140/9031mG in hypertensive participants. < 130/59m18m in hypertensive participants with diabetes, heart failure or chronic kidney disease.;Long Term: Maintenance of blood pressure at goal levels.   Lipids Yes   Intervention Provide education and support for participant on nutrition & aerobic/resistive exercise along with prescribed medications to achieve LDL <70mg59mL >40mg.64mxpected Outcomes Short Term: Participant states understanding of desired cholesterol values and is compliant with medications prescribed. Participant is following exercise prescription and nutrition guidelines.;Long Term: Cholesterol controlled with medications as prescribed, with individualized exercise RX and with personalized nutrition plan. Value goals: LDL < 70mg, 79m> 40 mg.   Stress Yes   Intervention Offer individual and/or small group education and counseling on adjustment to heart disease, stress management and health-related lifestyle change. Teach and support self-help strategies.;Refer participants experiencing significant psychosocial distress to appropriate mental health specialists for further evaluation and treatment. When possible, include family members and significant  others in education/counseling sessions.   Expected Outcomes Short Term: Participant demonstrates changes in health-related behavior, relaxation and other stress management skills, ability to obtain effective social support, and compliance with psychotropic medications if prescribed.;Long  Term: Emotional wellbeing is indicated by absence of clinically significant psychosocial distress or social isolation.      Core Components/Risk Factors/Patient Goals Review:    Core Components/Risk Factors/Patient Goals at Discharge (Final Review):    ITP Comments:     ITP Comments    Row Name 11/27/16 0920           ITP Comments Dr. Fransico Him, Medical Director          Comments:  Pt is making some progress toward personal goals after completing 12 sessions. Pt on hold for exercise until cleared to return.  Pt with low HG 7.4 and received 2 units of PRBC.  Pt had repeat cbc on yesterday which showed increase in Hg to 9.4.  Pt last exercised on 12/26/16. Unable to assess psychosocial assessment.  Recommend continued exercise and life style modification education including  stress management and relaxation techniques to decrease cardiac risk profile. Cherre Huger, BSN Cardiac and Training and development officer

## 2017-01-23 ENCOUNTER — Encounter (HOSPITAL_COMMUNITY): Payer: Medicare Other

## 2017-01-26 ENCOUNTER — Encounter (HOSPITAL_COMMUNITY): Admission: RE | Admit: 2017-01-26 | Payer: Medicare Other | Source: Ambulatory Visit

## 2017-01-26 ENCOUNTER — Encounter: Payer: Self-pay | Admitting: Pulmonary Disease

## 2017-01-26 ENCOUNTER — Telehealth: Payer: Self-pay | Admitting: Cardiology

## 2017-01-26 ENCOUNTER — Ambulatory Visit (INDEPENDENT_AMBULATORY_CARE_PROVIDER_SITE_OTHER): Payer: Medicare Other | Admitting: Pulmonary Disease

## 2017-01-26 DIAGNOSIS — J45909 Unspecified asthma, uncomplicated: Secondary | ICD-10-CM

## 2017-01-26 DIAGNOSIS — E6609 Other obesity due to excess calories: Secondary | ICD-10-CM | POA: Diagnosis not present

## 2017-01-26 DIAGNOSIS — IMO0001 Reserved for inherently not codable concepts without codable children: Secondary | ICD-10-CM

## 2017-01-26 DIAGNOSIS — R0902 Hypoxemia: Secondary | ICD-10-CM | POA: Diagnosis not present

## 2017-01-26 DIAGNOSIS — E662 Morbid (severe) obesity with alveolar hypoventilation: Secondary | ICD-10-CM | POA: Diagnosis not present

## 2017-01-26 DIAGNOSIS — G4710 Hypersomnia, unspecified: Secondary | ICD-10-CM

## 2017-01-26 DIAGNOSIS — R911 Solitary pulmonary nodule: Secondary | ICD-10-CM | POA: Diagnosis not present

## 2017-01-26 DIAGNOSIS — Z6838 Body mass index (BMI) 38.0-38.9, adult: Secondary | ICD-10-CM

## 2017-01-26 NOTE — Assessment & Plan Note (Signed)
ABG Co2 50s (09/2016) Needs a sleep study but she wanted to hold off.

## 2017-01-26 NOTE — Telephone Encounter (Signed)
Returning your call. °

## 2017-01-26 NOTE — Telephone Encounter (Signed)
Dr.Jordan cleared patient for endo anesthesia.Form faxed back to [facility] at fax # [phone].

## 2017-01-26 NOTE — Assessment & Plan Note (Addendum)
Pt with snoring, witnessed apneas, hypersomnia, fatigue.  She wanted to hold off on a sleep study. Pt with OHS.  Cont o2 2L at HS.  Will need an ONO on 2L on f/u.

## 2017-01-26 NOTE — Patient Instructions (Signed)
It was a pleasure taking care of you today!  Continue using your Advair. Once your  done with your Advair, switch to [place]  daily.  You will need a repeat chest CT scan end  of November 2018.  Follow-up after the chest CT scan.

## 2017-01-26 NOTE — Telephone Encounter (Signed)
New message     Fax [phone]  Fax anesthesia clearance to this fax number please.

## 2017-01-26 NOTE — Assessment & Plan Note (Signed)
Weight reduction 

## 2017-01-26 NOTE — Assessment & Plan Note (Signed)
Incidental 5 mm nodule in the lingula. Scan was done in November 2017. Quit smoking 30 years ago. Not a big smoker.  No personal history of CA. Rpt chest ct scan in 08/2017.

## 2017-01-26 NOTE — Telephone Encounter (Signed)
Form refaxed to [facility] [phone].

## 2017-01-26 NOTE — Progress Notes (Signed)
Subjective:    Patient ID: Patricia [name], female    DOB: [birth year], 81 y.o.   MRN: [id number]  HPI    This is the case of Patricia [name], 81 y.o. Female, who Is being seen today for shortness of breath.  As you very well know, patient  is a 81 year old obese white female with history of aortic stenosis, hypertension, atrial fibrillation, coronary artery disease, chronic diastolic congestive heart failure, acute on chronic hypoxic respiratory failure, hyperlipidemia, and GI bleeding with anemia.  She recently had a TAVR a week ago.   Pt had a 21 PY smoking history, quit when she was 81 yrs old. Pt was diagnosed with adult asthma. Triggers: strong odors, pollen, change in weather. Has been on advair 100/50 1P BID for a while. Also on alb as needed.   She was started on O2 2L 24/7 during Thanksgiving admission for SOB.   Pt's SOB is some better after having TAVR. She continues to have episodic SOB.   Pt has snoring, occasional gasping or choking. Has witnessed apneas.  Has hypersomnia, mild. Naps daily.  Hypersomnia affects her fxnality.   ROV 01/26/17 Patient returns to the office as follow-up on her dyspnea. Since last seen, she is overall improved. She barely uses her oxygen now during the daytime. She uses oxygen at bedtime. She checks her O2 saturation and it usually runs in the mid 90s on room air with exertion. Her asthma has also been stable. She is finishing her Advair and will switch to [employer].  because of insurance coverage.  Review of Systems  Constitutional: Positive for unexpected weight change.  HENT: Negative.   Eyes: Positive for redness and itching.  Respiratory: Positive for chest tightness, shortness of breath and wheezing.   Cardiovascular: Positive for leg swelling.  Gastrointestinal: Negative.   Endocrine: Negative.   Genitourinary: Negative.   Musculoskeletal: Negative.   Skin: Negative.   Allergic/Immunologic: Positive for environmental allergies.    Neurological: Positive for dizziness.  Hematological: Bruises/bleeds easily.  Psychiatric/Behavioral: Negative.         Objective:   Physical Exam Vitals:  Vitals:   01/26/17 0913  BP: 132/72  Pulse: 67  SpO2: 92%  Weight: 211 lb 12.8 oz (96.1 kg)  Height: _0  (1.6 m)    Constitutional/General:  Pleasant, well-nourished, well-developed, not in any distress,  Comfortably seating.  Well kempt  Body mass index is 37.52 kg/m. Wt Readings from Last 3 Encounters:  01/26/17 211 lb 12.8 oz (96.1 kg)  01/20/17 213 lb (96.6 kg)  12/23/16 215 lb (97.5 kg)     HEENT: Pupils equal and reactive to light and accommodation. Anicteric sclerae. Normal nasal mucosa.   No oral  lesions,  mouth clear,  oropharynx clear, no postnasal drip. (-) Oral thrush. No dental caries.  Airway - Mallampati class IV  Neck: No masses. Midline trachea. No JVD, (-) LAD. (-) bruits appreciated.  Respiratory/Chest: Grossly normal chest. (-) deformity. (-) Accessory muscle use.  Symmetric expansion. (-) Tenderness on palpation.  Resonant on percussion.  Diminished BS on both lower lung zones. (-) wheezing, crackles, rhonchi (-) egophony  Cardiovascular: Regular rate and  rhythm, heart sounds normal, no murmur or gallops, no peripheral edema  Gastrointestinal:  Normal bowel sounds. Soft, non-tender. No hepatosplenomegaly.  (-) masses.   Musculoskeletal:  Normal muscle tone. Normal gait.   Extremities: Grossly normal. (-) clubbing, cyanosis.  (-) edema  Skin: (-) rash,lesions seen.   Neurological/Psychiatric : alert, oriented to time,  place, person. Normal mood and affect          Assessment & Plan:  Asthma PFT (09/2016)  FEV1  1.36  74%, DLCO 59. Significant BD response. Seems stable. Cont  Advair 100/50, one puff twice a day. Advised her to rinse her mouth each use. Advair will be switched to [place] daily because of insurance coverage.  Cont alb MDI and nebs prn.   Obesity  hypoventilation syndrome (HCC) ABG Co2 50s (09/2016) Needs a sleep study but she wanted to hold off.  Hypersomnia Pt with snoring, witnessed apneas, hypersomnia, fatigue.  She wanted to hold off on a sleep study. Pt with OHS.  Cont o2 2L at HS.  Will need an ONO on 2L on f/u.   Obesity Weight reduction  Hypoxemia Pt was seen initially for hypoxemia. She has significant comorbidities. She  had a TAVR on Oct 14, 2016. She was started and oxygen, 2 L 24/7 when she was admitted during Thanksgiving for acute diastolic heart failure and pulmonary edema related to severe aortic stenosis.  She is significantly improved. Cardiac issues have been stable. She has a pulse oximeter and keeps her O2 saturation more than 88%.  Usually during the daytime, with exertion on room air, her O2 saturation runs 90% at least.  Her hypoxemia is multifactorial. 1. Related to her cardiac issues. She had severe aortic stenosis for which she had TAVR in 09/2016.  She has diastolic heart failure. 2. Obesity hypoventilation syndrome. She had an ABG prior to TAVR and her CO2 was in the 49-50 range.  3. Likely with obstructive sleep apnea. 4. She had PFT in 09/2016 and her  FEV1 was  1.36 or 74% predicted. Significant bronchodilator response. She has asthma which is probably contributing  into it but it's just mild and is controlled.  Plan: 1. Observe O2 saturation on room air with exertion. Keep more than 88%. Currently, she stays in the low 90s range. 2. Continue oxygen, 2 L at bedtime. She will need an ONO on 2L on f/u.  2. Mentioned about obesity hypoventilation syndrome and my concern about obstructive sleep apnea. She wanted to hold off on sleep study. 3. Rx asthma. Asthma has been stable.   Lung nodule Incidental 5 mm nodule in the lingula. Scan was done in November 2017. Quit smoking 30 years ago. Not a big smoker.  No personal history of CA. Rpt chest ct scan in 08/2017.      Patient will follow up  with me in 6 mos.     Monica Becton, MD 01/26/2017   1:18 PM Pulmonary and [place] Pager: [phone] Office: [phone], Fax: [phone]

## 2017-01-26 NOTE — Assessment & Plan Note (Addendum)
Pt was seen initially for hypoxemia. She has significant comorbidities. She  had a TAVR on Oct 14, 2016. She was started and oxygen, 2 L 24/7 when she was admitted during Thanksgiving for acute diastolic heart failure and pulmonary edema related to severe aortic stenosis.  She is significantly improved. Cardiac issues have been stable. She has a pulse oximeter and keeps her O2 saturation more than 88%.  Usually during the daytime, with exertion on room air, her O2 saturation runs 90% at least.  Her hypoxemia is multifactorial. 1. Related to her cardiac issues. She had severe aortic stenosis for which she had TAVR in 09/2016.  She has diastolic heart failure. 2. Obesity hypoventilation syndrome. She had an ABG prior to TAVR and her CO2 was in the 49-50 range.  3. Likely with obstructive sleep apnea. 4. She had PFT in 09/2016 and her  FEV1 was  1.36 or 74% predicted. Significant bronchodilator response. She has asthma which is probably contributing  into it but it's just mild and is controlled.  Plan: 1. Observe O2 saturation on room air with exertion. Keep more than 88%. Currently, she stays in the low 90s range. 2. Continue oxygen, 2 L at bedtime. She will need an ONO on 2L on f/u.  2. Mentioned about obesity hypoventilation syndrome and my concern about obstructive sleep apnea. She wanted to hold off on sleep study. 3. Rx asthma. Asthma has been stable.

## 2017-01-26 NOTE — Assessment & Plan Note (Addendum)
PFT (09/2016)  FEV1  1.36  74%, DLCO 59. Significant BD response. Seems stable. Cont  Advair 100/50, one puff twice a day. Advised her to rinse her mouth each use. Advair will be switched to [place] daily because of insurance coverage.  Cont alb MDI and nebs prn.

## 2017-01-27 ENCOUNTER — Telehealth: Payer: Self-pay | Admitting: Cardiology

## 2017-01-27 NOTE — Telephone Encounter (Signed)
New message      Please refax clearance for GI anesthesia to [phone].  They did not get it.  See previous message from [facility]

## 2017-01-27 NOTE — Telephone Encounter (Signed)
Refaxed clearance form to [facility] at [phone]

## 2017-01-28 ENCOUNTER — Encounter (HOSPITAL_COMMUNITY): Payer: Medicare Other

## 2017-01-30 ENCOUNTER — Encounter (HOSPITAL_COMMUNITY): Admission: RE | Admit: 2017-01-30 | Payer: Medicare Other | Source: Ambulatory Visit

## 2017-02-02 ENCOUNTER — Encounter (HOSPITAL_COMMUNITY): Payer: Medicare Other

## 2017-02-04 ENCOUNTER — Encounter (HOSPITAL_COMMUNITY): Payer: Medicare Other

## 2017-02-06 ENCOUNTER — Encounter (HOSPITAL_COMMUNITY): Payer: Medicare Other

## 2017-02-06 ENCOUNTER — Encounter: Payer: Self-pay | Admitting: Cardiology

## 2017-02-08 DIAGNOSIS — R05 Cough: Secondary | ICD-10-CM | POA: Diagnosis not present

## 2017-02-09 ENCOUNTER — Ambulatory Visit (INDEPENDENT_AMBULATORY_CARE_PROVIDER_SITE_OTHER): Payer: Medicare Other | Admitting: Physician Assistant

## 2017-02-09 ENCOUNTER — Encounter (HOSPITAL_COMMUNITY): Admission: RE | Admit: 2017-02-09 | Payer: Medicare Other | Source: Ambulatory Visit

## 2017-02-09 VITALS — BP 104/61 | HR 72 | Temp 98.2°F | Resp 18 | Wt 208.6 lb

## 2017-02-09 DIAGNOSIS — J209 Acute bronchitis, unspecified: Secondary | ICD-10-CM

## 2017-02-09 DIAGNOSIS — R059 Cough, unspecified: Secondary | ICD-10-CM

## 2017-02-09 DIAGNOSIS — R05 Cough: Secondary | ICD-10-CM | POA: Diagnosis not present

## 2017-02-09 MED ORDER — BENZONATATE 100 MG PO CAPS: 100.0000 mg | ORAL_CAPSULE | Freq: Three times a day (TID) | ORAL | 0 refills | Status: DC | PRN

## 2017-02-09 MED ORDER — DOXYCYCLINE HYCLATE 100 MG PO TABS: 100.0000 mg | ORAL_TABLET | Freq: Two times a day (BID) | ORAL | 0 refills | Status: DC

## 2017-02-09 NOTE — Progress Notes (Signed)
Patricia [name]  MRN: [id number] DOB: 27-Feb-1936  PCP: Jeanmarie Hubert, MD  Subjective:  Pt is a 81 year old female PMH  HTN, CAD, asthma, COPD, atrial fibrillation, DM, obesity, HLD, anemia, aortic valve stenosis presents to clinic for cough.  She had a cold about 9 days ago. Her cough has continued and worsened over the past few days. Non-productive. She has felt hot, however has not taken her temperature.  Pain lower right side when she coughs. Has to sleep in recliner.  She endorses mild wheezing and shob. She has used nebulizer and Albuterol recently - nebulizer 3 times in the past week, Albuterol is maybe 2x/day recently.  Denies chest tightness, chills, headache, syncope, sore throat, muscle aches, n/v, abdominal pain.  She has tried OTC cough and cold medications. Not helping.  She is on O2 at night. 90-95% at home during the day.   08/2016 and 09/2016 - hospital admissions: acute respiratory failure and aortic valve replacement.   F/u with cardiology regularly.   Review of Systems  Constitutional: Positive for fever. Negative for chills, diaphoresis and fatigue.  HENT: Positive for postnasal drip. Negative for congestion, rhinorrhea, sinus pressure, sneezing and sore throat.   Respiratory: Positive for cough. Negative for chest tightness, shortness of breath and wheezing.   Cardiovascular: Negative for chest pain and palpitations.  Gastrointestinal: Negative for abdominal pain, diarrhea, nausea and vomiting.  Neurological: Negative for weakness, light-headedness and headaches.    Patient Active Problem List   Diagnosis Date Noted  . Lung nodule 01/26/2017  . Macrocytosis 01/20/2017  . Hypoxemia 10/24/2016  . Asthma 10/24/2016  . Hypersomnia 10/24/2016  . Obesity hypoventilation syndrome ([facility]) 10/24/2016  . 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 SOB (shortness of breath)   . Aortic valve stenosis 09/11/2016  . Acute blood loss anemia 02/06/2016    . Chronic diastolic CHF (congestive heart failure) ([place]) 02/06/2016  . Atrial fibrillation ([place]) 02/05/2016  . Primary localized osteoarthrosis of right shoulder 12/18/2015  . Osteoarthritis of right shoulder 12/18/2015  . Post-nasal drainage 11/27/2015  . Anemia 09/26/2015  . Neck pain 05/23/2015  . Knee pain, bilateral 07/20/2014  . Lumbago 11/02/2013  . Trigger finger, acquired 11/02/2013  . Hyperlipidemia 11/02/2013  . Urinary incontinence 11/02/2013  . Rash and nonspecific skin eruption 11/02/2013  . COPD (chronic obstructive pulmonary disease) ([place])   . Hypothyroidism   . Obesity   . Essential hypertension 03/02/2013  . DM type 2 (diabetes mellitus, type 2) ([place]) 03/02/2013    Current Outpatient Prescriptions on File Prior to Visit  Medication Sig Dispense Refill  . acetaminophen (TYLENOL) 500 MG tablet Take 500 mg by mouth every 6 (six) hours as needed for mild pain.    Marland Kitchen albuterol (PROVENTIL HFA;VENTOLIN HFA) 108 (90 Base) MCG/ACT inhaler Inhale 2 puffs into the lungs every 6 (six) hours as needed for wheezing or shortness of breath. 1 Inhaler 11  . albuterol (PROVENTIL) (2.5 MG/3ML) 0.083% nebulizer solution Take 3 mLs (2.5 mg total) by nebulization every 4 (four) hours. And as needed 240 mL 3  . amiodarone (PACERONE) 200 MG tablet Take one tablet daily by mouth 90 tablet 3  . apixaban (ELIQUIS) 5 MG TABS tablet Take 1 tablet (5 mg total) by mouth 2 (two) times daily. 60 tablet 3  . cetirizine (ZYRTEC) 10 MG tablet Take 10 mg by mouth daily as needed for allergies.     . chlorhexidine (PERIDEX) 0.12 % solution Rinse with 15 mls twice  daily for 30 seconds. Use after breakfast and at bedtime. Spit out excess. Do not swallow. 480 mL prn  . cholecalciferol (VITAMIN D) 1000 units tablet Take 1,000 Units by mouth daily.    . COMFORT LANCETS MISC Use daily to get blood for glucometer 100 each 3  . doxazosin (CARDURA) 4 MG tablet Take one tablet by mouth once daily to help control  blood pressure 90 tablet 3  . ferrous sulfate 325 (65 FE) MG EC tablet Take 325 mg by mouth 2 (two) times daily.    . fluticasone furoate-vilanterol (BREO ELLIPTA) 100-25 MCG/INH AEPB Inhale into the lungs once daily 1 each 4  . furosemide (LASIX) 40 MG tablet Take 1 tablet (40 mg total) by mouth daily. 90 tablet 1  . glimepiride (AMARYL) 2 MG tablet TAKE 1 TABLET BY MOUTH AT  BREAKFAST TO CONTROL  GLUCOSE 90 tablet 3  . glucose blood (BAYER CONTOUR TEST) test strip Use daily too check glucose 100 each 4  . Ketotifen Fumarate (ALLERGY EYE DROPS OP) Apply 1 drop to eye daily as needed (allergies).    Marland Kitchen levothyroxine (SYNTHROID, LEVOTHROID) 50 MCG tablet Take 1 tablet by mouth  daily for thyroid  supplement 90 tablet 1  . lisinopril (PRINIVIL,ZESTRIL) 10 MG tablet Take 1 tablet (10 mg total) by mouth daily. 30 tablet 3  . metFORMIN (GLUCOPHAGE) 500 MG tablet Take 1 tablet (500 mg total) by mouth 2 (two) times daily with a meal. 180 tablet 3  . mirabegron ER (MYRBETRIQ) 25 MG TB24 tablet One daily to help bladder control 90 tablet 5  . pantoprazole (PROTONIX) 40 MG tablet Take 1 tablet (40 mg total) by mouth 2 (two) times daily. 180 tablet 0  . potassium gluconate 595 (99 K) MG TABS tablet Take 595 mg by mouth daily. TAKES ONLY WHEN USING FUROSEMIDE    . pravastatin (PRAVACHOL) 20 MG tablet TAKE 1 TABLET BY MOUTH  DAILY 90 tablet 1  . triamcinolone (KENALOG) 0.025 % cream Apply 1 application topically daily as needed (psoriasis).     . ADVAIR DISKUS 100-50 MCG/DOSE AEPB Inhale 1 inhalation two  times daily for breathing 180 each 3   No current facility-administered medications on file prior to visit.     Allergies  Allergen Reactions  . Codeine Other (See Comments)    "spaces her out, dizziness, can't walk well"  . Vesicare [Solifenacin] Itching     Objective:  BP 104/61 (Cuff Size: Large)   Pulse 72   Temp 98.2 F (36.8 C) (Oral)   Resp 18   Wt 208 lb 9.6 oz (94.6 kg)   SpO2 92%    BMI 36.95 kg/m   Physical Exam  Constitutional: She is oriented to person, place, and time and well-developed, well-nourished, and in no distress. No distress.  Cardiovascular: Normal rate, regular rhythm and normal heart sounds.   Pulmonary/Chest: Effort normal. She has no decreased breath sounds. She has no wheezes. She has rhonchi in the right lower field and the left lower field.  Neurological: She is alert and oriented to person, place, and time. GCS score is 15.  Skin: Skin is warm and dry.  Psychiatric: Mood, memory, affect and judgment normal.  Vitals reviewed.   Assessment and Plan :  1. Acute bronchitis, unspecified organism 2. Cough - doxycycline (VIBRA-TABS) 100 MG tablet; Take 1 tablet (100 mg total) by mouth 2 (two) times daily.  Dispense: 20 tablet; Refill: 0 - benzonatate (TESSALON) 100 MG capsule; Take 1-2 capsules (  100-200 mg total) by mouth 3 (three) times daily as needed for cough.  Dispense: 40 capsule; Refill: 0 - Possible drug interaction between Azithromycin and Amiodarone, will treat with Doxy.  She is on O2 at night. 90-95% at home during the day. Encouraged pt to con't monitor her O2%, stay well hydrated. RTC in 3-5 days if no improvement. She understands and agrees with plan.   Mercer Pod, PA-C  [facility] 02/09/2017 4:02 PM

## 2017-02-09 NOTE — Patient Instructions (Addendum)
Please stay well hydrated. Come back if you are not better in 5-7 days.   Thank you for coming in today. I hope you feel we met your needs.  Feel free to call [facility] if you have any questions or further requests.  Please consider signing up for MyChart if you do not already have it, as this is a great way to communicate with me.  Best,  Whitney McVey, PA-C   IF you received an x-ray today, you will receive an invoice from [facility]. Please contact [facility] at [phone] with questions or concerns regarding your invoice.   IF you received labwork today, you will receive an invoice from [place]. Please contact [facility] at [phone] with questions or concerns regarding your invoice.   Our billing staff will not be able to assist you with questions regarding bills from these companies.  You will be contacted with the lab results as soon as they are available. The fastest way to get your results is to activate your My Chart account. Instructions are located on the last page of this paperwork. If you have not heard from [place] regarding the results in 2 weeks, please contact this office.

## 2017-02-11 ENCOUNTER — Encounter (HOSPITAL_COMMUNITY): Payer: Medicare Other

## 2017-02-13 ENCOUNTER — Encounter (HOSPITAL_COMMUNITY): Payer: Medicare Other

## 2017-02-16 ENCOUNTER — Telehealth (HOSPITAL_COMMUNITY): Payer: Self-pay | Admitting: *Deleted

## 2017-02-16 ENCOUNTER — Encounter (HOSPITAL_COMMUNITY): Admission: RE | Admit: 2017-02-16 | Payer: Medicare Other | Source: Ambulatory Visit

## 2017-02-16 ENCOUNTER — Telehealth (HOSPITAL_COMMUNITY): Payer: Self-pay | Admitting: Internal Medicine

## 2017-02-16 NOTE — Telephone Encounter (Signed)
  Return call to pt from message left earlier.  Pt is unsure of when she may return to exercise. Pt went to doctor last week and diagnosis of respiratory infection. Pt does not feel better and plans to go back to the doctor.  Pt has upcoming GI appt for stomach pain.  Pt last exercised on 2/9.  Mutually decided to discharge pt from cardiac rehab. Pt advised to please call when she feels better and would be able to participate on a consistent basis. Pt verbalized understanding and is in agreement of this plan. Cherre Huger, BSN Cardiac and Training and development officer

## 2017-02-16 NOTE — Progress Notes (Signed)
Discharge Summary  Patient Details  Name: Patricia [name] MRN: [id number] Date of Birth: 07/09/1936 Referring Provider:     CARDIAC REHAB PHASE II ORIENTATION from 11/27/2016 in [place]  Referring Provider  Sherren Mocha MD       Number of Visits: 12  Reason for Discharge:  Early Exit:  Personal pt with additional medical issues that are preventing pt from returning on a consistent basis.  Pt opted to discharge self.  Pt last exercised on 12/26/16  Smoking History:  History  Smoking Status  . Former Smoker  . Packs/day: 1.00  . Years: 40.00  . Quit date: 09/07/1989  Smokeless Tobacco  . Never Used    Diagnosis:  No diagnosis found.  ADL UCSD:   Initial Exercise Prescription:     Initial Exercise Prescription - 11/27/16 1200      Date of Initial Exercise RX and Referring Provider   Date 11/27/16   Referring Provider Sherren Mocha MD     Recumbant Bike   Level --   Minutes --   METs --     NuStep   Level 1   Minutes 20   METs 1.3     Arm Ergometer   Level 1   Minutes 10   METs 1     Prescription Details   Frequency (times per week) 3   Duration Progress to 30 minutes of continuous aerobic without signs/symptoms of physical distress     Intensity   THRR 40-80% of Max Heartrate 57-114   Ratings of Perceived Exertion 11-13   Perceived Dyspnea 0-4     Progression   Progression Continue progressive overload as per policy without signs/symptoms or physical distress.     Resistance Training   Training Prescription Yes   Weight 1lb   Reps 10-12      Discharge Exercise Prescription (Final Exercise Prescription Changes):     Exercise Prescription Changes - 12/26/16 1542      Response to Exercise   Blood Pressure (Admit) 130/56   Blood Pressure (Exercise) 144/58   Blood Pressure (Exit) 122/58   Heart Rate (Admit) 78 bpm   Heart Rate (Exercise) 88 bpm   Heart Rate (Exit) 77 bpm   Rating of Perceived Exertion  (Exercise) 13   Symptoms none   Duration Continue with 30 min of aerobic exercise without signs/symptoms of physical distress.   Intensity THRR unchanged     Progression   Progression Continue to progress workloads to maintain intensity without signs/symptoms of physical distress.   Average METs 2.1     Resistance Training   Training Prescription Yes   Weight 2kbs   Reps 10-15   Time 10 Minutes     NuStep   Level 3   Minutes 20   METs 1.9     Arm Ergometer   Level 1   Minutes 10   METs 2.35     Home Exercise Plan   Plans to continue exercise at Home (comment)   Frequency Add 3 additional days to program exercise sessions.   Initial Home Exercises Provided 12/10/16      Functional Capacity:     6 Minute Walk    Row Name 11/27/16 0938 11/27/16 1031 11/27/16 1228     6 Minute Walk   Phase Initial  -  -   Distance 400 feet  -  -   Walk Time 2.16 minutes  -  -   # of Rest Breaks  1  pt stopped _0 .16 and did not want to continue  -  -   MPH  -  - 2.1   METS  -  - 0.35   RPE 13  -  -   VO2 Peak  -  - 1.23   Symptoms Yes (comment)  -  -   Comments 9/10 R knee pain , 3/10 R hip pain 9/10 R knee pain , 3/10 R hip pain- pt did not continue with walk test due to hip/knee pain  -   Resting HR 63 bpm  -  -   Resting BP 150/60  -  -   Max Ex. HR 89 bpm  -  -   Max Ex. BP 162/64  -  -   2 Minute Post BP 152/66  -  -      Psychological, QOL, Others - Outcomes: PHQ 2/9: Depression screen [facility] 2/9 02/18/2017 02/09/2017 12/01/2016 09/16/2016 01/23/2016  Decreased Interest 0 - 0 0 0  Down, Depressed, Hopeless 0 0 0 0 0  PHQ - 2 Score 0 0 0 0 0    Quality of Life:     Quality of Life - 11/27/16 1257      Quality of Life Scores   Health/Function Pre 19.27 %   Socioeconomic Pre 23.42 %   Psych/Spiritual Pre 22.5 %   Family Pre 30 %   GLOBAL Pre 22.05 %      Personal Goals: Goals established at orientation with interventions provided to work toward goal.      Personal Goals and Risk Factors at Admission - 11/27/16 1249      Core Components/Risk Factors/Patient Goals on Admission    Weight Management Yes;Obesity;Weight Loss   Intervention Weight Management: Develop a combined nutrition and exercise program designed to reach desired caloric intake, while maintaining appropriate intake of nutrient and fiber, sodium and fats, and appropriate energy expenditure required for the weight goal.;Weight Management: Provide education and appropriate resources to help participant work on and attain dietary goals.;Obesity: Provide education and appropriate resources to help participant work on and attain dietary goals.;Weight Management/Obesity: Establish reasonable short term and long term weight goals.   Expected Outcomes Short Term: Continue to assess and modify interventions until short term weight is achieved;Weight Maintenance: Understanding of the daily nutrition guidelines, which includes 25-35% calories from fat, 7% or less cal from saturated fats, less than 279m cholesterol, less than 1.5gm of sodium, & 5 or more servings of fruits and vegetables daily;Long Term: Adherence to nutrition and physical activity/exercise program aimed toward attainment of established weight goal;Weight Loss: Understanding of general recommendations for a balanced deficit meal plan, which promotes 1-2 lb weight loss per week and includes a negative energy balance of [phone] kcal/d;Understanding recommendations for meals to include 15-35% energy as protein, 25-35% energy from fat, 35-60% energy from carbohydrates, less than 2032mof dietary cholesterol, 20-35 gm of total fiber daily;Understanding of distribution of calorie intake throughout the day with the consumption of 4-5 meals/snacks   Sedentary Yes   Intervention Provide advice, education, support and counseling about physical activity/exercise needs.;Develop an individualized exercise prescription for aerobic and resistive  training based on initial evaluation findings, risk stratification, comorbidities and participant's personal goals.   Expected Outcomes Achievement of increased cardiorespiratory fitness and enhanced flexibility, muscular endurance and strength shown through measurements of functional capacity and personal statement of participant.   Increase Strength and Stamina Yes   Intervention Provide advice, education, support and counseling about physical  activity/exercise needs.;Develop an individualized exercise prescription for aerobic and resistive training based on initial evaluation findings, risk stratification, comorbidities and participant's personal goals.   Expected Outcomes Achievement of increased cardiorespiratory fitness and enhanced flexibility, muscular endurance and strength shown through measurements of functional capacity and personal statement of participant.   Improve shortness of breath with ADL's Yes   Intervention Provide education, individualized exercise plan and daily activity instruction to help decrease symptoms of SOB with activities of daily living.   Expected Outcomes Short Term: Achieves a reduction of symptoms when performing activities of daily living.   Diabetes Yes   Intervention Provide education about signs/symptoms and action to take for hypo/hyperglycemia.;Provide education about proper nutrition, including hydration, and aerobic/resistive exercise prescription along with prescribed medications to achieve blood glucose in normal ranges: Fasting glucose 65-99 mg/dL   Expected Outcomes Short Term: Participant verbalizes understanding of the signs/symptoms and immediate care of hyper/hypoglycemia, proper foot care and importance of medication, aerobic/resistive exercise and nutrition plan for blood glucose control.;Long Term: Attainment of HbA1C < 7%.   Hypertension Yes   Intervention Provide education on lifestyle modifcations including regular physical activity/exercise,  weight management, moderate sodium restriction and increased consumption of fresh fruit, vegetables, and low fat dairy, alcohol moderation, and smoking cessation.;Monitor prescription use compliance.   Expected Outcomes Short Term: Continued assessment and intervention until BP is < 140/64m HG in hypertensive participants. < 130/875mHG in hypertensive participants with diabetes, heart failure or chronic kidney disease.;Long Term: Maintenance of blood pressure at goal levels.   Lipids Yes   Intervention Provide education and support for participant on nutrition & aerobic/resistive exercise along with prescribed medications to achieve LDL <7050mHDL >76m73m Expected Outcomes Short Term: Participant states understanding of desired cholesterol values and is compliant with medications prescribed. Participant is following exercise prescription and nutrition guidelines.;Long Term: Cholesterol controlled with medications as prescribed, with individualized exercise RX and with personalized nutrition plan. Value goals: LDL < 70mg87mL > 40 mg.   Stress Yes   Intervention Offer individual and/or small group education and counseling on adjustment to heart disease, stress management and health-related lifestyle change. Teach and support self-help strategies.;Refer participants experiencing significant psychosocial distress to appropriate mental health specialists for further evaluation and treatment. When possible, include family members and significant others in education/counseling sessions.   Expected Outcomes Short Term: Participant demonstrates changes in health-related behavior, relaxation and other stress management skills, ability to obtain effective social support, and compliance with psychotropic medications if prescribed.;Long Term: Emotional wellbeing is indicated by absence of clinically significant psychosocial distress or social isolation.       Personal Goals Discharge:   Nutrition & Weight -  Outcomes:     Pre Biometrics - 11/27/16 1230      Pre Biometrics   % Body Fat 52.9 %         Post Biometrics - 02/27/17 1043       Post  Biometrics   Weight 212 lb 15.4 oz (96.6 kg)      Nutrition:     Nutrition Therapy & Goals - 12/15/16 1330      Nutrition Therapy   Diet Carb Modified, Therapeutic Lifestyle Changes     Personal Nutrition Goals   Nutrition Goal Wt loss of 1-2 lb/week to a wt loss goal of 6-24 lb at graduation from [facility] Intervention Plan   Intervention Prescribe, educate and counsel regarding individualized specific dietary modifications aiming towards targeted core components  such as weight, hypertension, lipid management, diabetes, heart failure and other comorbidities.   Expected Outcomes Short Term Goal: Understand basic principles of dietary content, such as calories, fat, sodium, cholesterol and nutrients.;Long Term Goal: Adherence to prescribed nutrition plan.      Nutrition Discharge:     Nutrition Assessments - 12/15/16 1330      MEDFICTS Scores   Pre Score 70      Education Questionnaire Score:     Knowledge Questionnaire Score - 11/27/16 1228      Knowledge Questionnaire Score   Pre Score 19/24      Goals reviewed with patient. Pt discharged from cardiac rehab program today with completion of 12 exercise sessions in Phase II. Pt maintained fair attendance up into March 2nd.  Pt did not return back to exercise due to competing medical issues.  Unable to complete post assessments.  During pt brief participation, pt made progress with improving her shortness of breath and was able to exercise without using oxygen therapy. Pt was very excited that she did not have to wear oxygen and could maintain her o2 saturations. Pt hopes to return at some point when she is medically stable to do so. Cherre Huger, BSN Cardiac and Training and development officer

## 2017-02-18 ENCOUNTER — Ambulatory Visit (INDEPENDENT_AMBULATORY_CARE_PROVIDER_SITE_OTHER): Payer: Medicare Other | Admitting: Family Medicine

## 2017-02-18 ENCOUNTER — Ambulatory Visit (INDEPENDENT_AMBULATORY_CARE_PROVIDER_SITE_OTHER): Payer: Medicare Other

## 2017-02-18 ENCOUNTER — Encounter (HOSPITAL_COMMUNITY)
Admission: RE | Admit: 2017-02-18 | Discharge: 2017-02-18 | Disposition: A | Discharge status: Home / Self Care | Payer: Medicare Other | Source: Ambulatory Visit | Attending: Cardiovascular Disease | Admitting: Cardiovascular Disease

## 2017-02-18 VITALS — BP 119/74 | HR 74 | Temp 98.1°F | Resp 18 | Ht 63.0 in | Wt 208.0 lb

## 2017-02-18 DIAGNOSIS — J441 Chronic obstructive pulmonary disease with (acute) exacerbation: Secondary | ICD-10-CM | POA: Diagnosis not present

## 2017-02-18 DIAGNOSIS — R05 Cough: Secondary | ICD-10-CM

## 2017-02-18 DIAGNOSIS — R059 Cough, unspecified: Secondary | ICD-10-CM

## 2017-02-18 DIAGNOSIS — D649 Anemia, unspecified: Secondary | ICD-10-CM | POA: Diagnosis not present

## 2017-02-18 DIAGNOSIS — Z952 Presence of prosthetic heart valve: Secondary | ICD-10-CM | POA: Insufficient documentation

## 2017-02-18 LAB — POCT CBC
Granulocyte percent: 74 %G (ref 37–80)
HCT, POC: 26.1 % — AB (ref 37.7–47.9)
Hemoglobin: 8.8 g/dL — AB (ref 12.2–16.2)
Lymph, poc: 1.4 (ref 0.6–3.4)
MCH, POC: 28.4 pg (ref 27–31.2)
MCHC: 33.7 g/dL (ref 31.8–35.4)
MCV: 84.2 fL (ref 80–97)
MID (cbc): 0.5 (ref 0–0.9)
MPV: 7.3 fL (ref 0–99.8)
POC Granulocyte: 5.3 (ref 2–6.9)
POC LYMPH PERCENT: 19.2 %L (ref 10–50)
POC MID %: 6.8 %M (ref 0–12)
Platelet Count, POC: 302 10*3/uL (ref 142–424)
RBC: 3.1 M/uL — AB (ref 4.04–5.48)
RDW, POC: 16.6 %
WBC: 7.1 10*3/uL (ref 4.6–10.2)

## 2017-02-18 LAB — COMPREHENSIVE METABOLIC PANEL
ALT: 42 IU/L — ABNORMAL HIGH (ref 0–32)
AST: 32 IU/L (ref 0–40)
Albumin/Globulin Ratio: 1.3 (ref 1.2–2.2)
Albumin: 4 g/dL (ref 3.5–4.8)
Alkaline Phosphatase: 65 IU/L (ref 39–117)
BUN/Creatinine Ratio: 23 (ref 12–28)
BUN: 36 mg/dL — ABNORMAL HIGH (ref 8–27)
Bilirubin Total: 0.2 mg/dL (ref 0.0–1.2)
CO2: 22 mmol/L (ref 18–29)
Calcium: 9.4 mg/dL (ref 8.7–10.3)
Chloride: 98 mmol/L (ref 96–106)
Creatinine, Ser: 1.6 mg/dL — ABNORMAL HIGH (ref 0.57–1.00)
GFR calc Af Amer: 35 mL/min/{1.73_m2} — ABNORMAL LOW (ref >59)
GFR calc non Af Amer: 31 mL/min/{1.73_m2} — ABNORMAL LOW (ref >59)
Globulin, Total: 3 g/dL (ref 1.5–4.5)
Glucose: 133 mg/dL — ABNORMAL HIGH (ref 65–99)
Potassium: 4.7 mmol/L (ref 3.5–5.2)
Sodium: 136 mmol/L (ref 134–144)
Total Protein: 7 g/dL (ref 6.0–8.5)

## 2017-02-18 MED ORDER — ALBUTEROL SULFATE (2.5 MG/3ML) 0.083% IN NEBU
2.5000 mg | INHALATION_SOLUTION | Freq: Once | RESPIRATORY_TRACT | Status: AC
  Administered 2017-02-18: 2.5 mg via RESPIRATORY_TRACT

## 2017-02-18 MED ORDER — IPRATROPIUM BROMIDE 0.02 % IN SOLN
0.5000 mg | Freq: Once | RESPIRATORY_TRACT | Status: AC
  Administered 2017-02-18: 0.5 mg via RESPIRATORY_TRACT

## 2017-02-18 MED ORDER — PREDNISONE 10 MG PO TABS: ORAL_TABLET | ORAL | 0 refills | Status: DC

## 2017-02-18 NOTE — Patient Instructions (Addendum)
It was good to see you today.  Take the prednisone as follow:  Take 6 pills today, 5 pills tomorrow, 4 pills today after, 3 pills after, 2 pills after, 1 pill last day.   Continue using your nebulizer every 4 - 6 hours.    Come back and see me on Saturday so we can check how you're doing.   IF you received an x-ray today, you will receive an invoice from [facility]. Please contact [facility] at [phone] with questions or concerns regarding your invoice.   IF you received labwork today, you will receive an invoice from [place]. Please contact [facility] at [phone] with questions or concerns regarding your invoice.   Our billing staff will not be able to assist you with questions regarding bills from these companies.  You will be contacted with the lab results as soon as they are available. The fastest way to get your results is to activate your My Chart account. Instructions are located on the last page of this paperwork. If you have not heard from [place] regarding the results in 2 weeks, please contact this office.

## 2017-02-18 NOTE — Progress Notes (Signed)
Subjective:    Patricia [name] is a 80 y.o. female who presents to PCP today for cough:  1.  Cough:  With wheezing.  Patient seen here 4/16 a little over a week ago For persistent cough. At that time she had had cough for about 2 weeks but it was worse for 5 days prior to coming in. She was diagnosed with bronchitis and treated with an antibiotic and Tessalon.  Since that visit she has had a persistent cough. She states is actually worsening. She has notable wheezing she herself can hear. She is using either her nebulizer or inhaler 5-6 times a day. She has minimal improvement with nebulizer and no improvement with her inhaler. Describes a dry hacking cough. On occasion productive of any sputum. She did have an episode of post tussive emesis today around 4 AM. She's been taking NyQuil to help with sleep at night. However she has been out of this and has had trouble sleeping since being out of this secondary to her cough.  She is status post cardiac valve replacement December 2016. She has history of A. fib and is on amiodarone. She's had no palpitations or chest pain. No lower extremity edema. No current URI symptoms. She has Zyrtec which she has been taking. She has been prescribed nasal corticosteroid in the past but stopped taking this due to nasal dryness and nosebleeds.  No fevers or chills.    ROS as above per HPI.    The following portions of the patient's history were reviewed and updated as appropriate: allergies, current medications, past medical history, family and social history, and problem list. Patient is a nonsmoker.    PMH reviewed.  Past Medical History:  Diagnosis Date  . Allergy   . Anemia, unspecified   . Arthritis   . Asthma   . Atrial fibrillation status post cardioversion [facility]) 09/2015   s/p TEE/DCCV>>SR on amio  . Benign neoplasm of colon   . Carpal tunnel syndrome   . Cellulitis and abscess of finger, unspecified   . Cerumen impaction   . Cervicalgia   . CHF  (congestive heart failure) ([place])   . Chronic airway obstruction, not elsewhere classified   . Chronic anticoagulation 09/2015   Xarelto for afib, CHADS2VASC=5  . Diarrhea   . Diffuse cystic mastopathy   . Dysrhythmia   . Edema   . Encounter for long-term (current) use of other medications   . GERD (gastroesophageal reflux disease)   . Heart murmur   . Lumbago   . Neck pain 05/23/2015  . Obesity, unspecified   . Other and unspecified hyperlipidemia   . Other psoriasis   . Pain in joint, lower leg   . PONV (postoperative nausea and vomiting)   . Primary localized osteoarthrosis of right shoulder 12/18/2015  . Routine gynecological examination   . 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 Scoliosis (and kyphoscoliosis), idiopathic   . Severe aortic stenosis   . Shortness of breath dyspnea    with exertion  . Type II or unspecified type diabetes mellitus without mention of complication, uncontrolled   . Unspecified essential hypertension   . Unspecified hemorrhoids without mention of complication   . Unspecified hypothyroidism   . Urge incontinence    Past Surgical History:  Procedure Laterality Date  . ABDOMINAL HYSTERECTOMY  1987   complete  . BREAST SURGERY  right breast 3 surgeries [phone]  . CARDIAC CATHETERIZATION N/A 09/02/2016   Procedure: Right/Left Heart Cath and Coronary Angiography;  Surgeon: [name] M Martinique, MD;  Location: [facility];  Service: Cardiovascular;  Laterality: N/A;  . CARDIOVERSION N/A 10/19/2015   Procedure: CARDIOVERSION;  Surgeon: Lelon Perla, MD;  Location: [facility];  Service: Cardiovascular;  Laterality: N/A;  . [place]  . COLON SURGERY     2004,2007,2013.  3 times colon surgieres  . EXCISE LE MANDIBULAR LYMPH NODE T     DR C. NEWMAN  . FOOT SURGERY  1989  . LEFT SHOULDER  ARTHROSCOPY    . 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 NEUROPLASTY / TRANSPOSITION MEDIAN NERVE AT CARPAL TUNNEL BILATERAL  05/2003  . TEE WITHOUT CARDIOVERSION N/A 10/19/2015   Procedure: Transesophageal Echocardiogram (TEE) ;  Surgeon: Lelon Perla, MD;  Location: [facility];  Service: Cardiovascular;  Laterality: N/A;  . TEE WITHOUT CARDIOVERSION N/A 10/14/2016   Procedure: TRANSESOPHAGEAL ECHOCARDIOGRAM (TEE);  Surgeon: Sherren Mocha, MD;  Location: [place];  Service: Open Heart Surgery;  Laterality: N/A;  . TOTAL SHOULDER ARTHROPLASTY Right 12/18/2015   Procedure: TOTAL SHOULDER ARTHROPLASTY;  Surgeon: [name], MD;  Location: [facility];  Service: Orthopedics;  Laterality: Right;  . TRANSCATHETER AORTIC VALVE REPLACEMENT, TRANSFEMORAL N/A 10/14/2016   Procedure: TRANSCATHETER AORTIC VALVE REPLACEMENT, TRANSFEMORAL;  Surgeon: Sherren Mocha, MD;  Location: [facility] [place];  Service: Open Heart Surgery;  Laterality: N/A;  . TRIGGER FINGER RELEASE Left   . VAGINAL CYST REMOVED  1967    Medications reviewed.    Objective:   Physical Exam BP 119/74   Pulse 74   Temp 98.1 F (36.7 C) (Oral)   Resp 18   Ht _0  (1.6 m)   Wt 208 lb (94.3 kg)   SpO2 93%   BMI 36.85 kg/m  Gen:  Alert, cooperative patient who appears stated age in no acute distress.  Vital signs reviewed. HEENT: EOMI.  PERRL.  Some mild clear exudates noted BL nasal turbinates.  MMM Cardiac:  Regular rate and rhythm  Pulm:  Wheezing present in all lung fields.    Abd:  Soft/nondistended/nontender.   Exts: Non edematous BL  LE, warm and well perfused.   Results for orders placed or performed in visit on 02/18/17  POCT CBC  Result Value Ref Range   WBC 7.1 4.6 - 10.2 K/uL   Lymph, poc 1.4 0.6 - 3.4   POC LYMPH PERCENT 19.2 10 - 50 %L   MID (cbc) 0.5 0 - 0.9   POC MID % 6.8 0 - 12 %M   POC Granulocyte 5.3 2 - 6.9   Granulocyte percent 74.0 37 - 80 %G   RBC 3.10 (A) 4.04 - 5.48 M/uL   Hemoglobin 8.8 (A) 12.2 - 16.2 g/dL    HCT, POC 26.1 (A) 37.7 - 47.9 %   MCV 84.2 80 - 97 fL   MCH, POC 28.4 27 - 31.2 pg   MCHC 33.7 31.8 - 35.4 g/dL   RDW, POC 16.6 %   Platelet Count, POC 302 142 - 424 K/uL   MPV 7.3 0 - 99.8 fL    Imp/Plan: 1.  Bronchitis: - s/p breathing treatment x 2 here in clinic:  Lung exam after the second nebulizer treatment is much improved. She still had a few scattered wheezes but much better air movement. - Treating with prednisone and continued nebulizer at home. - WBC normal  here.  CXR without any evidence of PNA.  - FU in 3 days to assess for improvement.  I will be here and can examine her then.   2.  Anemia: - chronic. - likely not helping feelings of dyspnea.  - FU with PCP for this.

## 2017-02-20 ENCOUNTER — Encounter (HOSPITAL_COMMUNITY): Payer: Medicare Other

## 2017-02-21 ENCOUNTER — Ambulatory Visit (INDEPENDENT_AMBULATORY_CARE_PROVIDER_SITE_OTHER): Payer: Medicare Other | Admitting: Family Medicine

## 2017-02-21 ENCOUNTER — Encounter: Payer: Self-pay | Admitting: Family Medicine

## 2017-02-21 VITALS — BP 132/72 | HR 81 | Temp 97.9°F | Resp 17 | Ht 62.5 in | Wt 204.0 lb

## 2017-02-21 DIAGNOSIS — J441 Chronic obstructive pulmonary disease with (acute) exacerbation: Secondary | ICD-10-CM | POA: Diagnosis not present

## 2017-02-21 MED ORDER — PANTOPRAZOLE SODIUM 40 MG PO TBEC: 40.0000 mg | DELAYED_RELEASE_TABLET | Freq: Two times a day (BID) | ORAL | 3 refills | Status: DC

## 2017-02-21 NOTE — Patient Instructions (Addendum)
  It was very good to see you again today.  I'm so glad that you're doing much better!  As we discussed you can move down to as needed for your nebulizer treatments.  If you start having worsening cough or trouble breathing be sure to come back and see [place] but I think you should be good to go from here.   IF you received an x-ray today, you will receive an invoice from [facility]. Please contact [facility] at [phone] with questions or concerns regarding your invoice.   IF you received labwork today, you will receive an invoice from [place]. Please contact [facility] at [phone] with questions or concerns regarding your invoice.   Our billing staff will not be able to assist you with questions regarding bills from these companies.  You will be contacted with the lab results as soon as they are available. The fastest way to get your results is to activate your My Chart account. Instructions are located on the last page of this paperwork. If you have not heard from [place] regarding the results in 2 weeks, please contact this office.

## 2017-02-21 NOTE — Progress Notes (Signed)
Patricia [name] is a 81 y.o. female who presents to Primary Care at [facility] today for FU for COPD exacerbation:  1.  COPD exac:  Patient seen here on Wednesday 3 days ago for COPD exacerbation. She was given 2 nebulizer treatments and prescription for prednisone. She left and continued to use her albuterol nebulizer roughly 3 times a day since then. She states she started feeling much better Thursday afternoon. She felt great yesterday and today.  She is sleeping well at nighttime. She never has a cough at night. She does have some dry cough during the day but this is sporadic. She is continuing to use her nebulizer 3 times a day but wants to cut back because this makes her jittery. She is not sure if it is significant to do this. She has been out shopping. She's had no shortness of breath when going for walks. No fevers or chills.  No production of phlegm with cough. She is eating and drinking well.  Feels "great."  ROS as above.    PMH reviewed. Patient is a nonsmoker.   Past Medical History:  Diagnosis Date  . Allergy   . Anemia, unspecified   . Arthritis   . Asthma   . Atrial fibrillation status post cardioversion [facility]) 09/2015   s/p TEE/DCCV>>SR on amio  . Benign neoplasm of colon   . Carpal tunnel syndrome   . Cellulitis and abscess of finger, unspecified   . Cerumen impaction   . Cervicalgia   . CHF (congestive heart failure) ([place])   . Chronic airway obstruction, not elsewhere classified   . Chronic anticoagulation 09/2015   Xarelto for afib, CHADS2VASC=5  . Diarrhea   . Diffuse cystic mastopathy   . Dysrhythmia   . Edema   . Encounter for long-term (current) use of other medications   . GERD (gastroesophageal reflux disease)   . Heart murmur   . Lumbago   . Neck pain 05/23/2015  . Obesity, unspecified   . Other and unspecified hyperlipidemia   . Other psoriasis   . Pain in joint, lower leg   . PONV (postoperative nausea and vomiting)   . Primary localized  osteoarthrosis of right shoulder 12/18/2015  . Routine gynecological examination   . 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 Scoliosis (and kyphoscoliosis), idiopathic   . Severe aortic stenosis   . Shortness of breath dyspnea    with exertion  . Type II or unspecified type diabetes mellitus without mention of complication, uncontrolled   . Unspecified essential hypertension   . Unspecified hemorrhoids without mention of complication   . Unspecified hypothyroidism   . Urge incontinence    Past Surgical History:  Procedure Laterality Date  . ABDOMINAL HYSTERECTOMY  1987   complete  . BREAST SURGERY     right breast 3 surgeries [phone]  . CARDIAC CATHETERIZATION N/A 09/02/2016   Procedure: Right/Left Heart Cath and Coronary Angiography;  Surgeon: [name] M Martinique, MD;  Location: [place] [facility];  Service: Cardiovascular;  Laterality: N/A;  . CARDIOVERSION N/A 10/19/2015   Procedure: CARDIOVERSION;  Surgeon: Lelon Perla, MD;  Location: [place];  Service: Cardiovascular;  Laterality: N/A;  . [place]  . COLON SURGERY     2004,2007,2013.  3 times colon surgieres  . EXCISE LE MANDIBULAR LYMPH NODE T     DR C.  NEWMAN  . FOOT SURGERY  1989  . LEFT SHOULDER ARTHROSCOPY    . 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 NEUROPLASTY / TRANSPOSITION MEDIAN NERVE AT CARPAL TUNNEL BILATERAL  05/2003  . TEE WITHOUT CARDIOVERSION N/A 10/19/2015   Procedure: Transesophageal Echocardiogram (TEE) ;  Surgeon: Lelon Perla, MD;  Location: [facility] ENDOSCOPY;  Service: Cardiovascular;  Laterality: N/A;  . TEE WITHOUT CARDIOVERSION N/A 10/14/2016   Procedure: TRANSESOPHAGEAL ECHOCARDIOGRAM (TEE);  Surgeon: Sherren Mocha, MD;  Location: [place];  Service: Open Heart Surgery;  Laterality: N/A;  . TOTAL SHOULDER ARTHROPLASTY Right 12/18/2015   Procedure:  TOTAL SHOULDER ARTHROPLASTY;  Surgeon: [name], MD;  Location: [place];  Service: Orthopedics;  Laterality: Right;  . TRANSCATHETER AORTIC VALVE REPLACEMENT, TRANSFEMORAL N/A 10/14/2016   Procedure: TRANSCATHETER AORTIC VALVE REPLACEMENT, TRANSFEMORAL;  Surgeon: Sherren Mocha, MD;  Location: [place];  Service: Open Heart Surgery;  Laterality: N/A;  . TRIGGER FINGER RELEASE Left   . VAGINAL CYST REMOVED  1967    Medications reviewed. Current Outpatient Prescriptions  Medication Sig Dispense Refill  . acetaminophen (TYLENOL) 500 MG tablet Take 500 mg by mouth every 6 (six) hours as needed for mild pain.    Marland Kitchen ADVAIR DISKUS 100-50 MCG/DOSE AEPB Inhale 1 inhalation two  times daily for breathing 180 each 3  . albuterol (PROVENTIL HFA;VENTOLIN HFA) 108 (90 Base) MCG/ACT inhaler Inhale 2 puffs into the lungs every 6 (six) hours as needed for wheezing or shortness of breath. 1 Inhaler 11  . albuterol (PROVENTIL) (2.5 MG/3ML) 0.083% nebulizer solution Take 3 mLs (2.5 mg total) by nebulization every 4 (four) hours. And as needed 240 mL 3  . amiodarone (PACERONE) 200 MG tablet Take one tablet daily by mouth 90 tablet 3  . apixaban (ELIQUIS) 5 MG TABS tablet Take 1 tablet (5 mg total) by mouth 2 (two) times daily. 60 tablet 3  . benzonatate (TESSALON) 100 MG capsule Take 1-2 capsules (100-200 mg total) by mouth 3 (three) times daily as needed for cough. 40 capsule 0  . cetirizine (ZYRTEC) 10 MG tablet Take 10 mg by mouth daily as needed for allergies.     . chlorhexidine (PERIDEX) 0.12 % solution Rinse with 15 mls twice daily for 30 seconds. Use after breakfast and at bedtime. Spit out excess. Do not swallow. 480 mL prn  . cholecalciferol (VITAMIN D) 1000 units tablet Take 1,000 Units by mouth daily.    . COMFORT LANCETS MISC Use daily to get blood for glucometer 100 each 3  . doxazosin (CARDURA) 4 MG tablet Take one tablet by mouth once daily to help control blood pressure 90 tablet 3  . ferrous  sulfate 325 (65 FE) MG EC tablet Take 325 mg by mouth 2 (two) times daily.    . fluticasone furoate-vilanterol (BREO ELLIPTA) 100-25 MCG/INH AEPB Inhale into the lungs once daily 1 each 4  . furosemide (LASIX) 40 MG tablet Take 1 tablet (40 mg total) by mouth daily. 90 tablet 1  . glimepiride (AMARYL) 2 MG tablet TAKE 1 TABLET BY MOUTH AT  BREAKFAST TO CONTROL  GLUCOSE 90 tablet 3  . glucose blood (BAYER CONTOUR TEST) test strip Use daily too check glucose 100 each 4  . Ketotifen Fumarate (ALLERGY EYE DROPS OP) Apply 1 drop to eye daily as needed (allergies).    Marland Kitchen levothyroxine (SYNTHROID, LEVOTHROID) 50 MCG tablet Take 1 tablet by mouth  daily for thyroid  supplement 90 tablet 1  . lisinopril (PRINIVIL,ZESTRIL) 10 MG tablet Take  1 tablet (10 mg total) by mouth daily. 30 tablet 3  . metFORMIN (GLUCOPHAGE) 500 MG tablet Take 1 tablet (500 mg total) by mouth 2 (two) times daily with a meal. 180 tablet 3  . mirabegron ER (MYRBETRIQ) 25 MG TB24 tablet One daily to help bladder control 90 tablet 5  . potassium gluconate 595 (99 K) MG TABS tablet Take 595 mg by mouth daily. TAKES ONLY WHEN USING FUROSEMIDE    . pravastatin (PRAVACHOL) 20 MG tablet TAKE 1 TABLET BY MOUTH  DAILY 90 tablet 1  . predniSONE (DELTASONE) 10 MG tablet Take 6 pills today, 5 pills tomorrow, 4 pills today after, 3 pills after, 2 pills after, 1 pill last day 21 tablet 0  . triamcinolone (KENALOG) 0.025 % cream Apply 1 application topically daily as needed (psoriasis).      No current facility-administered medications for this visit.      Physical Exam:  BP 132/72   Pulse 81   Temp 97.9 F (36.6 C) (Oral)   Resp 17   Ht 5' 2.5" (1.588 m)   Wt 204 lb (92.5 kg)   SpO2 97%   BMI 36.72 kg/m  Gen:  Alert, cooperative patient who appears stated age in no acute distress.  Vital signs reviewed. HEENT: EOMI,  MMM Pulm:  Completely clear lungs today.  Good air movement.  No wheezes..   Cardiac:  Regular rate and rhythm with  Grade III Murmur throughout Ext:  No LE edema  Assessment and Plan:  1.  COPD exac: - much improved.   She is doing very well. - has 2 days of prednisone left - she can cut back to as needed nebulizer treatments because she is doing so well. - return precautions provided.

## 2017-02-22 NOTE — Progress Notes (Signed)
Cardiology Office Note   Date:  02/25/2017   ID:  Patricia [name], Patricia [name] Aug 05, 1936, MRN [id number]  PCP:  Jeanmarie Hubert, MD  Cardiologist:   Liborio Saccente Martinique, MD   Chief Complaint  Patient presents with  . Aortic Stenosis      History of Present Illness: Patricia [name] is a 81 y.o. female who presents for follow up atrial fibrillation and  aortic stenosis. She has a history of HTN, obesity, DM, HL, and anemia. She was admitted to the hospital in December 2016 with increased dyspnea. She was found to be in atrial fibrillation with RVR. She had pulmonary edema. Hgb was 10. Echo showed EF 55-60% with moderate to severe AS. She was anticoagulated with Xarelto and started on amiodarone. She had TEE guided DCCV with return to NSR. She was seen back in the office on 11/20/15 and was still in NSR.  On 12/18/15 she underwent right shoulder arthroplasty. Post op Hgb was 9.6. It subsequently dropped to 7.1. She was transfused. She has been on iron. Hgb improved to 10. She did have EGD and colonoscopy with both gastric and colonic polyps. No bleeding seen.   On follow up in October she noted symptoms of increased dyspnea.  She  Underwent cardiac cath showing severe AS with single vessel obstructive CAD with 75% OM3 lesion. Mild pulmonary HTN and elevated filling pressures. She was admitted in November with acute CHF and was diuresed. She was evaluated in valve clinic and underwent transfemoral TAVR on 10/14/16. Post op course was uncomplicated. Repeat Echo in January  Showed higher than expected AV prosthesis gradient felt to be related to vigorous LV systolic function and Valve/ patient mismatch. She has been on  Eliquis. She has noted moderate improvement in her breathing.   She continues to have issues with COPD and bronchitis- followed by primary care and pulmonary. Recently had a course of antibiotics and steroids.  In March she was noted to have progressive anemia with dark stools. Hgb down to 7.4. She  was transfused and plans to have endoscopy on May 24 with Dr. Shana Chute.    She complains of feeling dizzy and states her balance is real bad. Has to hold on to something when walking.     Past Medical History:  Diagnosis Date  . Allergy   . Anemia, unspecified   . Arthritis   . Asthma   . Atrial fibrillation status post cardioversion [facility]) 09/2015   s/p TEE/DCCV>>SR on amio  . Benign neoplasm of colon   . Carpal tunnel syndrome   . Cellulitis and abscess of finger, unspecified   . Cerumen impaction   . Cervicalgia   . CHF (congestive heart failure) ([place])   . Chronic airway obstruction, not elsewhere classified   . Chronic anticoagulation 09/2015   Xarelto for afib, CHADS2VASC=5  . Diarrhea   . Diffuse cystic mastopathy   . Dysrhythmia   . Edema   . Encounter for long-term (current) use of other medications   . GERD (gastroesophageal reflux disease)   . Heart murmur   . Lumbago   . Neck pain 05/23/2015  . Obesity, unspecified   . Other and unspecified hyperlipidemia   . Other psoriasis   . Pain in joint, lower leg   . PONV (postoperative nausea and vomiting)   . Primary localized osteoarthrosis of right shoulder 12/18/2015  . Routine gynecological examination   . 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 Scoliosis (and kyphoscoliosis), idiopathic   . Severe aortic stenosis   . Shortness of breath dyspnea    with exertion  . Type II or unspecified type diabetes mellitus without mention of complication, uncontrolled   . Unspecified essential hypertension   . Unspecified hemorrhoids without mention of complication   . Unspecified hypothyroidism   . Urge incontinence     Past Surgical History:  Procedure Laterality Date  . ABDOMINAL HYSTERECTOMY  1987   complete  . BREAST SURGERY     right breast 3 surgeries [phone]  . CARDIAC  CATHETERIZATION N/A 09/02/2016   Procedure: Right/Left Heart Cath and Coronary Angiography;  Surgeon: Atira Borello M Martinique, MD;  Location: [place] [facility];  Service: Cardiovascular;  Laterality: N/A;  . CARDIOVERSION N/A 10/19/2015   Procedure: CARDIOVERSION;  Surgeon: Lelon Perla, MD;  Location: [place];  Service: Cardiovascular;  Laterality: N/A;  . [place]  . COLON SURGERY     2004,2007,2013.  3 times colon surgieres  . EXCISE LE MANDIBULAR LYMPH NODE T     DR C. NEWMAN  . FOOT SURGERY  1989  . LEFT SHOULDER ARTHROSCOPY    . 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 NEUROPLASTY / TRANSPOSITION MEDIAN NERVE AT CARPAL TUNNEL BILATERAL  05/2003  . TEE WITHOUT CARDIOVERSION N/A 10/19/2015   Procedure: Transesophageal Echocardiogram (TEE) ;  Surgeon: Lelon Perla, MD;  Location: [facility] ENDOSCOPY;  Service: Cardiovascular;  Laterality: N/A;  . TEE WITHOUT CARDIOVERSION N/A 10/14/2016   Procedure: TRANSESOPHAGEAL ECHOCARDIOGRAM (TEE);  Surgeon: Sherren Mocha, MD;  Location: [place];  Service: Open Heart Surgery;  Laterality: N/A;  . TOTAL SHOULDER ARTHROPLASTY Right 12/18/2015   Procedure: TOTAL SHOULDER ARTHROPLASTY;  Surgeon: [name], MD;  Location: [place];  Service: Orthopedics;  Laterality: Right;  . TRANSCATHETER AORTIC VALVE REPLACEMENT, TRANSFEMORAL N/A 10/14/2016   Procedure: TRANSCATHETER AORTIC VALVE REPLACEMENT, TRANSFEMORAL;  Surgeon: Sherren Mocha, MD;  Location: [place];  Service: Open Heart Surgery;  Laterality: N/A;  . TRIGGER FINGER RELEASE Left   . VAGINAL CYST REMOVED  1967     Current Outpatient Prescriptions  Medication Sig Dispense Refill  . acetaminophen (TYLENOL) 500 MG tablet Take 500 mg by mouth every 6 (six) hours as needed for mild pain.    Marland Kitchen ADVAIR DISKUS 100-50 MCG/DOSE AEPB Inhale 1 inhalation two  times daily for breathing 180 each 3  . albuterol (PROVENTIL HFA;VENTOLIN HFA) 108 (90 Base) MCG/ACT inhaler Inhale 2 puffs into the lungs  every 6 (six) hours as needed for wheezing or shortness of breath. 1 Inhaler 11  . albuterol (PROVENTIL) (2.5 MG/3ML) 0.083% nebulizer solution Take 3 mLs (2.5 mg total) by nebulization every 4 (four) hours. And as needed 240 mL 3  . amiodarone (PACERONE) 200 MG tablet Take one tablet daily by mouth 90 tablet 3  . apixaban (ELIQUIS) 5 MG TABS tablet Take 1 tablet (5 mg total) by mouth 2 (two) times daily. 60 tablet 3  . cetirizine (ZYRTEC) 10 MG tablet Take 10 mg by mouth daily as needed for allergies.     . chlorhexidine (PERIDEX) 0.12 % solution Rinse with 15 mls twice daily for 30 seconds. Use after breakfast and at bedtime. Spit out excess. Do not swallow. 480 mL prn  . cholecalciferol (VITAMIN D) 1000 units tablet Take 1,000 Units by mouth daily.    . COMFORT LANCETS MISC Use daily to get blood  for glucometer 100 each 3  . doxazosin (CARDURA) 4 MG tablet Take one tablet by mouth once daily to help control blood pressure 90 tablet 3  . ferrous sulfate 325 (65 FE) MG EC tablet Take 325 mg by mouth 2 (two) times daily.    . fluticasone furoate-vilanterol (BREO ELLIPTA) 100-25 MCG/INH AEPB Inhale into the lungs once daily 1 each 4  . furosemide (LASIX) 40 MG tablet Take 1 tablet (40 mg total) by mouth daily. 90 tablet 1  . glimepiride (AMARYL) 2 MG tablet TAKE 1 TABLET BY MOUTH AT  BREAKFAST TO CONTROL  GLUCOSE 90 tablet 3  . glucose blood (BAYER CONTOUR TEST) test strip Use daily too check glucose 100 each 4  . Ketotifen Fumarate (ALLERGY EYE DROPS OP) Apply 1 drop to eye daily as needed (allergies).    Marland Kitchen levothyroxine (SYNTHROID, LEVOTHROID) 50 MCG tablet Take 1 tablet by mouth  daily for thyroid  supplement 90 tablet 1  . lisinopril (PRINIVIL,ZESTRIL) 10 MG tablet Take 1 tablet (10 mg total) by mouth daily. 30 tablet 3  . metFORMIN (GLUCOPHAGE) 500 MG tablet Take 1 tablet (500 mg total) by mouth 2 (two) times daily with a meal. 180 tablet 3  . mirabegron ER (MYRBETRIQ) 25 MG TB24 tablet One  daily to help bladder control 90 tablet 5  . pantoprazole (PROTONIX) 40 MG tablet Take 1 tablet (40 mg total) by mouth 2 (two) times daily. 30 tablet 3  . potassium gluconate 595 (99 K) MG TABS tablet Take 595 mg by mouth daily. TAKES ONLY WHEN USING FUROSEMIDE    . pravastatin (PRAVACHOL) 20 MG tablet TAKE 1 TABLET BY MOUTH  DAILY 90 tablet 1  . predniSONE (DELTASONE) 10 MG tablet Take 6 pills today, 5 pills tomorrow, 4 pills today after, 3 pills after, 2 pills after, 1 pill last day 21 tablet 0  . triamcinolone (KENALOG) 0.025 % cream Apply 1 application topically daily as needed (psoriasis).      No current facility-administered medications for this visit.     Allergies:   Codeine and Vesicare [solifenacin]    Social History:  The patient  reports that she quit smoking about 27 years ago. She has a 40.00 pack-year smoking history. She has never used smokeless tobacco. She reports that she does not drink alcohol or use drugs.   Family History:  The patient's family history includes Arthritis in her sister; Asthma in her sister; Diabetes in her father; Heart disease in her brother and father; Liver cancer in her sister; Stroke in her father.    ROS:  Please see the history of present illness.   Otherwise, review of systems are positive for none.   All other systems are reviewed and negative.    PHYSICAL EXAM: VS:  BP (!) 112/54   Pulse 64   Ht 5' 2.5" (1.588 m)   Wt 208 lb 8 oz (94.6 kg)   BMI 37.53 kg/m  , BMI Body mass index is 37.53 kg/m. GEN: Well nourished, obese, in no acute distress  HEENT: normal  Neck: no JVD, carotid bruits, or masses Cardiac: RRR; harsh gr 2/6 systolic murmur RUSB.  No gallop  Respiratory:  clear to auscultation bilaterally, scant wheeze GI: soft, nontender, nondistended, + BS MS: no deformity or atrophy  Skin: warm and dry, no rash. no LE edema.  Neuro:  Strength and sensation are intact Psych: euthymic mood, full affect   EKG:  EKG is not  ordered today.  Recent Labs:  Lab Results  Component Value Date   WBC 7.1 02/18/2017   HGB 8.8 (A) 02/18/2017   HCT 26.1 (A) 02/18/2017   PLT 284 01/20/2017   GLUCOSE 133 (H) 02/18/2017   CHOL 182 04/28/2016   TRIG 174 (H) 04/28/2016   HDL 61 04/28/2016   LDLCALC 86 04/28/2016   ALT 42 (H) 02/18/2017   AST 32 02/18/2017   NA 136 02/18/2017   K 4.7 02/18/2017   CL 98 02/18/2017   CREATININE 1.60 (H) 02/18/2017   BUN 36 (H) 02/18/2017   CO2 22 02/18/2017   TSH 1.297 09/12/2016   INR 1.07 10/14/2016   HGBA1C 6.8 (H) 10/10/2016      Lipid Panel    Component Value Date/Time   CHOL 182 04/28/2016 0940   TRIG 174 (H) 04/28/2016 0940   HDL 61 04/28/2016 0940   CHOLHDL 3.0 04/28/2016 0940   LDLCALC 86 04/28/2016 0940      Wt Readings from Last 3 Encounters:  02/25/17 208 lb 8 oz (94.6 kg)  02/21/17 204 lb (92.5 kg)  02/18/17 208 lb (94.3 kg)      Other studies Reviewed: Additional studies/ records that were reviewed today include: Echo 04/30/16:Study Conclusions  - Left ventricle: The cavity size was normal. There was moderate   concentric hypertrophy. Systolic function was normal. The   estimated ejection fraction was in the range of 55% to 60%. Wall   motion was normal; there were no regional wall motion   abnormalities. Features are consistent with a pseudonormal left   ventricular filling pattern, with concomitant abnormal relaxation   and increased filling pressure (grade 2 diastolic dysfunction). - Aortic valve: There was moderate to severe stenosis. Mean   gradient (S): 40 mm Hg. Peak gradient (S): 65 mm Hg. Valve area   (VTI): 1.07 cm^2. Valve area (Vmax): 1 cm^2. Valve area (Vmean):   0.94 cm^2. - Mitral valve: Calcified annulus. Mildly thickened leaflets .   There was mild regurgitation. Valve area by continuity equation   (using LVOT flow): 1.88 cm^2. - Left atrium: The atrium was moderately to severely dilated. - Pulmonary arteries: Systolic pressure  was mildly increased. PA   peak pressure: 39 mm Hg (S).  Procedures   Right/Left Heart Cath and Coronary Angiography  Conclusion     LV end diastolic pressure is moderately elevated.  There is severe aortic valve stenosis.  There is no mitral valve stenosis.  Prox LAD to Mid LAD lesion, 20 %stenosed.  Ost 3rd Mrg to 3rd Mrg lesion, 75 %stenosed.  Ost 2nd Mrg to 2nd Mrg lesion, 30 %stenosed.  Ost RCA to Dist RCA lesion, 10 %stenosed.  Hemodynamic findings consistent with mild pulmonary hypertension.   1. Single vessel obstructive CAD involving OM3 2. Moderate to severe aortic stenosis. Mean gradient 41 mm Hg, valve index 0.7 3. Mild pulmonary HTN 4. Elevated LV filling pressures. Prominent V waves on PCWP tracings c/w decreased LV compliance.  5. Normal cardiac output.   Plan: recommend consideration for AVR/ single vessel CABG.     Echo 11/24/16: Study Conclusions  - Left ventricle: The cavity size was normal. Wall thickness was   increased in a pattern of mild LVH. There was mild focal basal   hypertrophy of the septum. Systolic function was vigorous. The   estimated ejection fraction was in the range of 65% to 70%. Wall   motion was normal; there were no regional wall motion   abnormalities. Features are consistent with a pseudonormal left  ventricular filling pattern, with concomitant abnormal relaxation   and increased filling pressure (grade 2 diastolic dysfunction).   Doppler parameters are consistent with high ventricular filling   pressure. - Aortic valve: A bioprosthesis was present. - Mitral valve: Severely calcified annulus. Mildly thickened   leaflets . There was mild regurgitation. - Left atrium: The atrium was severely dilated. - Right atrium: The atrium was mildly dilated. - Pulmonary arteries: Systolic pressure was moderately increased.   PA peak pressure: 46 mm Hg (S).  Impressions:  - Vigorous LV systolic function; grade 2 diastolic  dysfunction;   elevated LV filling pressure; mild LVH; s/p TAVR with elevated   mean gradient (29 mmHg); no AI; mild MR; severe LAE; mild RAE;   mild TR with moderately elevated pulmonary pressure.   ASSESSMENT AND PLAN:  1.  Atrial fibrillation. S/p DCCV in December 2016. Maintaining NSR on amiodarone.  Currently on Eliquis. Thyroid studies monitored by primary care.  2.  Severe Aortic stenosis. Echo in July showed progression with velocity > 4 cm/sec and mean gradient of 40 mm Hg. Now s/p TAVR in December 2017. Follow up Echo with higher than expected gradient (mean gradient of 29 mm Hg) due to patient/ prosthetic mismatch with small annuls size and vigorous LV function. Repeat Echo at one year.    3. Anemia Fe deficient with apparent GI blood loss. Last Hgb 8.8. On Eliquis. With AS at higher risk of AVMs. Await EGD in May.   4. Chronic kidney disease stage 3.   5. S/p right shoulder arthroplasty.   6. Chronic diastolic CHF. Not currently volume overloaded.  7. HTN- BP is low now. Given dizziness and imbalance will reduce doxazocin to 2 mg daily. May be able to reduced further if BP OK.   8. DM type 2.   9. COPD.     Current medicines are reviewed at length with the patient today.  The patient does not have concerns regarding medicines.  The following changes have been made:  See above  Labs/ tests ordered today include:   No orders of the defined types were placed in this encounter.    Disposition:  Follow up in 3 months.   Signed, Olsen Mccutchan Martinique, MD  02/25/2017 3:00 PM    [facility] [address], [place], [place], [address] Phone [phone], Fax [phone]

## 2017-02-23 ENCOUNTER — Encounter (HOSPITAL_COMMUNITY): Payer: Medicare Other

## 2017-02-25 ENCOUNTER — Encounter (HOSPITAL_COMMUNITY): Payer: Medicare Other

## 2017-02-25 ENCOUNTER — Ambulatory Visit (INDEPENDENT_AMBULATORY_CARE_PROVIDER_SITE_OTHER): Payer: Medicare Other | Admitting: Cardiology

## 2017-02-25 ENCOUNTER — Encounter: Payer: Self-pay | Admitting: Cardiology

## 2017-02-25 DIAGNOSIS — I10 Essential (primary) hypertension: Secondary | ICD-10-CM | POA: Diagnosis not present

## 2017-02-25 MED ORDER — DOXAZOSIN MESYLATE 2 MG PO TABS: 2.0000 mg | ORAL_TABLET | Freq: Every day | ORAL | 3 refills | Status: DC

## 2017-02-25 NOTE — Patient Instructions (Addendum)
Reduce doxazocin to 2 mg daily  Continue your other therapy  I will see you in 3 months.

## 2017-02-27 ENCOUNTER — Encounter (HOSPITAL_COMMUNITY): Payer: Medicare Other

## 2017-02-27 NOTE — Addendum Note (Signed)
Encounter addended by: Laquisha Northcraft D Maxamillion Banas on: 02/27/2017 11:08 AM<BR>    Actions taken: Visit Navigator Flowsheet section accepted

## 2017-03-02 ENCOUNTER — Encounter (HOSPITAL_COMMUNITY): Payer: Medicare Other

## 2017-03-03 NOTE — Addendum Note (Signed)
Encounter addended by: Jewel Baize, RD on: 03/03/2017  2:55 PM<BR>    Actions taken: Flowsheet data copied forward, Visit Navigator Flowsheet section accepted

## 2017-03-04 ENCOUNTER — Encounter (HOSPITAL_COMMUNITY): Payer: Medicare Other

## 2017-03-06 ENCOUNTER — Encounter (HOSPITAL_COMMUNITY): Payer: Medicare Other

## 2017-03-08 ENCOUNTER — Other Ambulatory Visit: Payer: Self-pay | Admitting: Physician Assistant

## 2017-03-09 ENCOUNTER — Encounter (HOSPITAL_COMMUNITY): Payer: Medicare Other

## 2017-03-09 DIAGNOSIS — R42 Dizziness and giddiness: Secondary | ICD-10-CM | POA: Diagnosis not present

## 2017-03-09 DIAGNOSIS — H6121 Impacted cerumen, right ear: Secondary | ICD-10-CM | POA: Diagnosis not present

## 2017-03-09 DIAGNOSIS — H9201 Otalgia, right ear: Secondary | ICD-10-CM | POA: Diagnosis not present

## 2017-03-11 ENCOUNTER — Encounter (HOSPITAL_COMMUNITY): Payer: Self-pay | Admitting: *Deleted

## 2017-03-11 ENCOUNTER — Encounter (HOSPITAL_COMMUNITY): Payer: Medicare Other

## 2017-03-11 ENCOUNTER — Inpatient Hospital Stay (HOSPITAL_COMMUNITY)
Admission: EM | Admit: 2017-03-11 | Discharge: 2017-03-13 | DRG: 378 | Disposition: A | Discharge status: Home / Self Care | Payer: Medicare Other | Attending: Internal Medicine | Admitting: Internal Medicine

## 2017-03-11 DIAGNOSIS — N183 Chronic kidney disease, stage 3 (moderate): Secondary | ICD-10-CM | POA: Diagnosis present

## 2017-03-11 DIAGNOSIS — K922 Gastrointestinal hemorrhage, unspecified: Secondary | ICD-10-CM | POA: Diagnosis present

## 2017-03-11 DIAGNOSIS — I5032 Chronic diastolic (congestive) heart failure: Secondary | ICD-10-CM | POA: Diagnosis present

## 2017-03-11 DIAGNOSIS — Z7984 Long term (current) use of oral hypoglycemic drugs: Secondary | ICD-10-CM | POA: Diagnosis not present

## 2017-03-11 DIAGNOSIS — Z952 Presence of prosthetic heart valve: Secondary | ICD-10-CM

## 2017-03-11 DIAGNOSIS — E039 Hypothyroidism, unspecified: Secondary | ICD-10-CM | POA: Diagnosis present

## 2017-03-11 DIAGNOSIS — Z7901 Long term (current) use of anticoagulants: Secondary | ICD-10-CM | POA: Diagnosis not present

## 2017-03-11 DIAGNOSIS — E1122 Type 2 diabetes mellitus with diabetic chronic kidney disease: Secondary | ICD-10-CM | POA: Diagnosis present

## 2017-03-11 DIAGNOSIS — Z90710 Acquired absence of both cervix and uterus: Secondary | ICD-10-CM

## 2017-03-11 DIAGNOSIS — E119 Type 2 diabetes mellitus without complications: Secondary | ICD-10-CM

## 2017-03-11 DIAGNOSIS — J449 Chronic obstructive pulmonary disease, unspecified: Secondary | ICD-10-CM | POA: Diagnosis present

## 2017-03-11 DIAGNOSIS — I10 Essential (primary) hypertension: Secondary | ICD-10-CM | POA: Diagnosis not present

## 2017-03-11 DIAGNOSIS — K317 Polyp of stomach and duodenum: Secondary | ICD-10-CM | POA: Diagnosis not present

## 2017-03-11 DIAGNOSIS — Z79899 Other long term (current) drug therapy: Secondary | ICD-10-CM

## 2017-03-11 DIAGNOSIS — D62 Acute posthemorrhagic anemia: Secondary | ICD-10-CM | POA: Diagnosis present

## 2017-03-11 DIAGNOSIS — Z96611 Presence of right artificial shoulder joint: Secondary | ICD-10-CM | POA: Diagnosis present

## 2017-03-11 DIAGNOSIS — E785 Hyperlipidemia, unspecified: Secondary | ICD-10-CM | POA: Diagnosis present

## 2017-03-11 DIAGNOSIS — I480 Paroxysmal atrial fibrillation: Secondary | ICD-10-CM | POA: Diagnosis not present

## 2017-03-11 DIAGNOSIS — R911 Solitary pulmonary nodule: Secondary | ICD-10-CM | POA: Diagnosis present

## 2017-03-11 DIAGNOSIS — K920 Hematemesis: Secondary | ICD-10-CM | POA: Diagnosis not present

## 2017-03-11 DIAGNOSIS — Z885 Allergy status to narcotic agent status: Secondary | ICD-10-CM

## 2017-03-11 DIAGNOSIS — I130 Hypertensive heart and chronic kidney disease with heart failure and stage 1 through stage 4 chronic kidney disease, or unspecified chronic kidney disease: Secondary | ICD-10-CM | POA: Diagnosis present

## 2017-03-11 DIAGNOSIS — D649 Anemia, unspecified: Secondary | ICD-10-CM | POA: Diagnosis not present

## 2017-03-11 DIAGNOSIS — K921 Melena: Secondary | ICD-10-CM | POA: Diagnosis not present

## 2017-03-11 DIAGNOSIS — K210 Gastro-esophageal reflux disease with esophagitis: Secondary | ICD-10-CM | POA: Diagnosis present

## 2017-03-11 DIAGNOSIS — K31811 Angiodysplasia of stomach and duodenum with bleeding: Secondary | ICD-10-CM | POA: Diagnosis present

## 2017-03-11 DIAGNOSIS — I959 Hypotension, unspecified: Secondary | ICD-10-CM | POA: Diagnosis present

## 2017-03-11 DIAGNOSIS — N179 Acute kidney failure, unspecified: Secondary | ICD-10-CM | POA: Diagnosis present

## 2017-03-11 DIAGNOSIS — K3189 Other diseases of stomach and duodenum: Secondary | ICD-10-CM | POA: Diagnosis not present

## 2017-03-11 DIAGNOSIS — Z825 Family history of asthma and other chronic lower respiratory diseases: Secondary | ICD-10-CM

## 2017-03-11 DIAGNOSIS — D500 Iron deficiency anemia secondary to blood loss (chronic): Secondary | ICD-10-CM | POA: Diagnosis not present

## 2017-03-11 DIAGNOSIS — Z87891 Personal history of nicotine dependence: Secondary | ICD-10-CM

## 2017-03-11 DIAGNOSIS — Z833 Family history of diabetes mellitus: Secondary | ICD-10-CM | POA: Diagnosis not present

## 2017-03-11 DIAGNOSIS — I152 Hypertension secondary to endocrine disorders: Secondary | ICD-10-CM | POA: Diagnosis present

## 2017-03-11 DIAGNOSIS — Z8249 Family history of ischemic heart disease and other diseases of the circulatory system: Secondary | ICD-10-CM

## 2017-03-11 DIAGNOSIS — Z888 Allergy status to other drugs, medicaments and biological substances status: Secondary | ICD-10-CM

## 2017-03-11 DIAGNOSIS — Z9049 Acquired absence of other specified parts of digestive tract: Secondary | ICD-10-CM

## 2017-03-11 DIAGNOSIS — E1159 Type 2 diabetes mellitus with other circulatory complications: Secondary | ICD-10-CM | POA: Diagnosis present

## 2017-03-11 LAB — COMPREHENSIVE METABOLIC PANEL
ALT: 34 U/L (ref 14–54)
AST: 30 U/L (ref 15–41)
Albumin: 3.5 g/dL (ref 3.5–5.0)
Alkaline Phosphatase: 52 U/L (ref 38–126)
Anion gap: 12 (ref 5–15)
BUN: 32 mg/dL — ABNORMAL HIGH (ref 6–20)
CO2: 23 mmol/L (ref 22–32)
Calcium: 8.6 mg/dL — ABNORMAL LOW (ref 8.9–10.3)
Chloride: 101 mmol/L (ref 101–111)
Creatinine, Ser: 2.22 mg/dL — ABNORMAL HIGH (ref 0.44–1.00)
GFR calc Af Amer: 23 mL/min — ABNORMAL LOW (ref >60)
GFR calc non Af Amer: 20 mL/min — ABNORMAL LOW (ref >60)
Glucose, Bld: 217 mg/dL — ABNORMAL HIGH (ref 65–99)
Potassium: 4.1 mmol/L (ref 3.5–5.1)
Sodium: 136 mmol/L (ref 135–145)
Total Bilirubin: 0.4 mg/dL (ref 0.3–1.2)
Total Protein: 6.6 g/dL (ref 6.5–8.1)

## 2017-03-11 LAB — CBC
HCT: 24 % — ABNORMAL LOW (ref 36.0–46.0)
Hemoglobin: 7.2 g/dL — ABNORMAL LOW (ref 12.0–15.0)
MCH: 26.7 pg (ref 26.0–34.0)
MCHC: 30 g/dL (ref 30.0–36.0)
MCV: 88.9 fL (ref 78.0–100.0)
Platelets: 238 10*3/uL (ref 150–400)
RBC: 2.7 MIL/uL — ABNORMAL LOW (ref 3.87–5.11)
RDW: 18.4 % — ABNORMAL HIGH (ref 11.5–15.5)
WBC: 5.5 10*3/uL (ref 4.0–10.5)

## 2017-03-11 LAB — I-STAT CG4 LACTIC ACID, ED: Lactic Acid, Venous: 1.38 mmol/L (ref 0.5–1.9)

## 2017-03-11 LAB — CBG MONITORING, ED: Glucose-Capillary: 67 mg/dL (ref 65–99)

## 2017-03-11 LAB — PROTIME-INR
INR: 1.29
Prothrombin Time: 16.2 seconds — ABNORMAL HIGH (ref 11.4–15.2)

## 2017-03-11 LAB — POC OCCULT BLOOD, ED: Fecal Occult Bld: POSITIVE — AB

## 2017-03-11 LAB — APTT: aPTT: 32 seconds (ref 24–36)

## 2017-03-11 LAB — PREPARE RBC (CROSSMATCH)

## 2017-03-11 MED ORDER — FLUTICASONE FUROATE-VILANTEROL 100-25 MCG/INH IN AEPB
1.0000 | INHALATION_SPRAY | Freq: Every day | RESPIRATORY_TRACT | Status: DC
  Administered 2017-03-12 – 2017-03-13 (×2): 1 via RESPIRATORY_TRACT
  Filled 2017-03-11: qty 28

## 2017-03-11 MED ORDER — ACETAMINOPHEN 650 MG RE SUPP: 650.0000 mg | Freq: Four times a day (QID) | RECTAL | Status: DC | PRN

## 2017-03-11 MED ORDER — INSULIN ASPART 100 UNIT/ML ~~LOC~~ SOLN
0.0000 [IU] | SUBCUTANEOUS | Status: DC
  Administered 2017-03-12: 2 [IU] via SUBCUTANEOUS

## 2017-03-11 MED ORDER — ONDANSETRON HCL 4 MG PO TABS: 4.0000 mg | ORAL_TABLET | Freq: Four times a day (QID) | ORAL | Status: DC | PRN

## 2017-03-11 MED ORDER — SODIUM CHLORIDE 0.9 % IV SOLN
8.0000 mg/h | INTRAVENOUS | Status: DC
  Administered 2017-03-12 (×3): 8 mg/h via INTRAVENOUS
  Filled 2017-03-11 (×8): qty 80

## 2017-03-11 MED ORDER — LORATADINE 10 MG PO TABS
10.0000 mg | ORAL_TABLET | Freq: Every day | ORAL | Status: DC
  Administered 2017-03-12 – 2017-03-13 (×2): 10 mg via ORAL
  Filled 2017-03-11 (×2): qty 1

## 2017-03-11 MED ORDER — SODIUM CHLORIDE 0.9 % IV SOLN
80.0000 mg | Freq: Once | INTRAVENOUS | Status: AC
  Administered 2017-03-11: 22:00:00 80 mg via INTRAVENOUS
  Filled 2017-03-11: qty 80

## 2017-03-11 MED ORDER — AMIODARONE HCL 200 MG PO TABS
200.0000 mg | ORAL_TABLET | Freq: Every day | ORAL | Status: DC
  Administered 2017-03-12 – 2017-03-13 (×2): 200 mg via ORAL
  Filled 2017-03-11 (×2): qty 1

## 2017-03-11 MED ORDER — MIRABEGRON ER 25 MG PO TB24
25.0000 mg | ORAL_TABLET | Freq: Every day | ORAL | Status: DC
  Administered 2017-03-12 – 2017-03-13 (×2): 25 mg via ORAL
  Filled 2017-03-11 (×2): qty 1

## 2017-03-11 MED ORDER — ACETAMINOPHEN 500 MG PO TABS: 500.0000 mg | ORAL_TABLET | Freq: Four times a day (QID) | ORAL | Status: DC | PRN

## 2017-03-11 MED ORDER — LEVOTHYROXINE SODIUM 50 MCG PO TABS
50.0000 ug | ORAL_TABLET | Freq: Every day | ORAL | Status: DC
  Administered 2017-03-12 – 2017-03-13 (×2): 50 ug via ORAL
  Filled 2017-03-11 (×2): qty 1

## 2017-03-11 MED ORDER — ONDANSETRON HCL 4 MG/2ML IJ SOLN: 4.0000 mg | Freq: Four times a day (QID) | INTRAMUSCULAR | Status: DC | PRN

## 2017-03-11 MED ORDER — SODIUM CHLORIDE 0.9 % IV BOLUS (SEPSIS)
1000.0000 mL | Freq: Once | INTRAVENOUS | Status: AC
  Administered 2017-03-11: 1000 mL via INTRAVENOUS

## 2017-03-11 MED ORDER — ACETAMINOPHEN 325 MG PO TABS: 650.0000 mg | ORAL_TABLET | Freq: Four times a day (QID) | ORAL | Status: DC | PRN

## 2017-03-11 MED ORDER — SODIUM CHLORIDE 0.9 % IV SOLN: Freq: Once | INTRAVENOUS | Status: DC

## 2017-03-11 MED ORDER — ALBUTEROL SULFATE (2.5 MG/3ML) 0.083% IN NEBU: 2.5000 mg | INHALATION_SOLUTION | RESPIRATORY_TRACT | Status: DC

## 2017-03-11 MED ORDER — PANTOPRAZOLE SODIUM 40 MG IV SOLR: 40.0000 mg | Freq: Two times a day (BID) | INTRAVENOUS | Status: DC

## 2017-03-11 MED ORDER — FERROUS SULFATE 325 (65 FE) MG PO TABS
325.0000 mg | ORAL_TABLET | Freq: Two times a day (BID) | ORAL | Status: DC
  Administered 2017-03-12 – 2017-03-13 (×3): 325 mg via ORAL
  Filled 2017-03-11 (×3): qty 1

## 2017-03-11 MED ORDER — ALBUTEROL SULFATE (2.5 MG/3ML) 0.083% IN NEBU
2.5000 mg | INHALATION_SOLUTION | Freq: Four times a day (QID) | RESPIRATORY_TRACT | Status: DC | PRN
  Administered 2017-03-12 – 2017-03-13 (×2): 2.5 mg via RESPIRATORY_TRACT
  Filled 2017-03-11 (×2): qty 3

## 2017-03-11 MED ORDER — SODIUM CHLORIDE 0.9% FLUSH
3.0000 mL | Freq: Two times a day (BID) | INTRAVENOUS | Status: DC
  Administered 2017-03-11 – 2017-03-13 (×3): 3 mL via INTRAVENOUS

## 2017-03-11 NOTE — ED Triage Notes (Signed)
Pt in c/o hypotension, pt has scheduled endoscopy next week, reports vomiting blood on Monday, pt vomited blood once last week, hx of ulcers, pt takes iron and reports dark stool, required blood transfusion 3/18, last hgb 8.6 last week, A&O x4

## 2017-03-11 NOTE — ED Notes (Signed)
Hospitalist at the bedside 

## 2017-03-11 NOTE — ED Provider Notes (Signed)
[name] DEPT Provider Note   CSN: [id number] Arrival date & time: 03/11/17  1434     History   Chief Complaint Chief Complaint  Patient presents with  . Weakness    HPI Patricia [name] is a 81 y.o. female.  The history is provided by the patient.  Dizziness  Quality:  Lightheadedness (feeling faint and "feels like I am going to pass out") Severity:  Moderate Onset quality:  Gradual Duration:  3 weeks Timing:  Constant Progression:  Worsening Chronicity:  New Context comment:  Standing up and ambulating.  Relieved by:  Being still Worsened by:  Standing up and movement Ineffective treatments:  None tried Associated symptoms: blood in stool, nausea and vomiting (bloody)   Associated symptoms: no shortness of breath   Risk factors: anemia     Past Medical History:  Diagnosis Date  . Allergy   . Anemia, unspecified   . Arthritis   . Asthma   . Atrial fibrillation status post cardioversion [facility]) 09/2015   s/p TEE/DCCV>>SR on amio  . Benign neoplasm of colon   . Carpal tunnel syndrome   . Cellulitis and abscess of finger, unspecified   . Cerumen impaction   . Cervicalgia   . CHF (congestive heart failure) ([place])   . Chronic airway obstruction, not elsewhere classified   . Chronic anticoagulation 09/2015   Xarelto for afib, CHADS2VASC=5  . Diarrhea   . Diffuse cystic mastopathy   . Dysrhythmia   . Edema   . Encounter for long-term (current) use of other medications   . GERD (gastroesophageal reflux disease)   . Heart murmur   . Lumbago   . Neck pain 05/23/2015  . Obesity, unspecified   . Other and unspecified hyperlipidemia   . Other psoriasis   . Pain in joint, lower leg   . PONV (postoperative nausea and vomiting)   . Primary localized osteoarthrosis of right shoulder 12/18/2015  . Routine gynecological examination   . 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 Scoliosis (and kyphoscoliosis), idiopathic   . Severe aortic stenosis   . Shortness of breath dyspnea    with exertion  . Type II or unspecified type diabetes mellitus without mention of complication, uncontrolled   . Unspecified essential hypertension   . Unspecified hemorrhoids without mention of complication   . Unspecified hypothyroidism   . Urge incontinence     Patient Active Problem List   Diagnosis Date Noted  . UGIB (upper gastrointestinal bleed) 03/11/2017  . AKI (acute kidney injury) ([place]) 03/11/2017  . Lung nodule 01/26/2017  . Macrocytosis 01/20/2017  . Hypoxemia 10/24/2016  . Asthma 10/24/2016  . Hypersomnia 10/24/2016  . Obesity hypoventilation syndrome ([place]) 10/24/2016  . 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 SOB (shortness of breath)   . Aortic valve stenosis 09/11/2016  . Acute blood loss anemia 02/06/2016  . Chronic diastolic CHF (congestive heart failure) ([place]) 02/06/2016  . Atrial fibrillation ([place]) 02/05/2016  . Primary localized osteoarthrosis of right shoulder 12/18/2015  . Osteoarthritis of right shoulder 12/18/2015  . Post-nasal drainage 11/27/2015  . Anemia 09/26/2015  . Neck pain 05/23/2015  . Knee pain, bilateral 07/20/2014  . Lumbago 11/02/2013  . Trigger finger, acquired 11/02/2013  . Hyperlipidemia 11/02/2013  . Urinary incontinence 11/02/2013  . Rash and nonspecific skin eruption 11/02/2013  . COPD (chronic obstructive pulmonary disease) ([place])   .  Hypothyroidism   . Obesity   . Essential hypertension 03/02/2013  . DM type 2 (diabetes mellitus, type 2) ([place]) 03/02/2013    Past Surgical History:  Procedure Laterality Date  . ABDOMINAL HYSTERECTOMY  1987   complete  . BREAST SURGERY     right breast 3 surgeries [phone]  . CARDIAC CATHETERIZATION N/A 09/02/2016   Procedure: Right/Left Heart Cath and Coronary Angiography;  Surgeon: [name] M Martinique, MD;  Location:  [place] [facility];  Service: Cardiovascular;  Laterality: N/A;  . CARDIOVERSION N/A 10/19/2015   Procedure: CARDIOVERSION;  Surgeon: Lelon Perla, MD;  Location: [place];  Service: Cardiovascular;  Laterality: N/A;  . [place]  . COLON SURGERY     2004,2007,2013.  3 times colon surgieres  . EXCISE LE MANDIBULAR LYMPH NODE T     DR C. NEWMAN  . FOOT SURGERY  1989  . LEFT SHOULDER ARTHROSCOPY    . 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 NEUROPLASTY / TRANSPOSITION MEDIAN NERVE AT CARPAL TUNNEL BILATERAL  05/2003  . TEE WITHOUT CARDIOVERSION N/A 10/19/2015   Procedure: Transesophageal Echocardiogram (TEE) ;  Surgeon: Lelon Perla, MD;  Location[facility] ENDOSCOPY;  Service: Cardiovascular;  Laterality: N/A;  . TEE WITHOUT CARDIOVERSION N/A 10/14/2016   Procedure: TRANSESOPHAGEAL ECHOCARDIOGRAM (TEE);  Surgeon: Sherren Mocha, MD;  Location: [place];  Service: Open Heart Surgery;  Laterality: N/A;  . TOTAL SHOULDER ARTHROPLASTY Right 12/18/2015   Procedure: TOTAL SHOULDER ARTHROPLASTY;  Surgeon: [name], MD;  Location: [place];  Service: Orthopedics;  Laterality: Right;  . TRANSCATHETER AORTIC VALVE REPLACEMENT, TRANSFEMORAL N/A 10/14/2016   Procedure: TRANSCATHETER AORTIC VALVE REPLACEMENT, TRANSFEMORAL;  Surgeon: Sherren Mocha, MD;  Location: [place];  Service: Open Heart Surgery;  Laterality: N/A;  . TRIGGER FINGER RELEASE Left   . VAGINAL CYST REMOVED  1967    OB History    No data available       Home Medications    Prior to Admission medications   Medication Sig Start Date End Date Taking? Authorizing Provider  acetaminophen (TYLENOL) 500 MG tablet Take 500 mg by mouth every 6 (six) hours as needed for mild pain.   Yes [provider]  ADVAIR DISKUS 100-50 MCG/DOSE AEPB Inhale 1 inhalation two  times daily for breathing 07/07/16  Yes Estill Dooms, MD  albuterol (PROVENTIL HFA;VENTOLIN HFA) 108 (90 Base) MCG/ACT inhaler Inhale 2 puffs into  the lungs every 6 (six) hours as needed for wheezing or shortness of breath. 01/20/17  Yes Estill Dooms, MD  albuterol (PROVENTIL) (2.5 MG/3ML) 0.083% nebulizer solution Take 3 mLs (2.5 mg total) by nebulization every 4 (four) hours. And as needed Patient taking differently: Take 2.5 mg by nebulization every 4 (four) hours as needed for wheezing or shortness of breath.  10/28/16  Yes de Finklea, [place], MD  amiodarone (PACERONE) 200 MG tablet Take one tablet daily by mouth Patient taking differently: Take 200 mg by mouth daily.  01/20/17  Yes Estill Dooms, MD  apixaban (ELIQUIS) 5 MG TABS tablet Take 1 tablet (5 mg total) by mouth 2 (two) times daily. 10/16/16  Yes Barrett, Erin R, PA-C  cetirizine (ZYRTEC) 10 MG tablet Take 10 mg by mouth daily as needed for allergies.    Yes [provider]  cholecalciferol (VITAMIN D) 1000 units tablet Take 1,000 Units by mouth daily.   Yes [provider]  COMFORT LANCETS MISC Use daily to get blood for  glucometer 03/08/14  Yes Estill Dooms, MD  doxazosin (CARDURA) 2 MG tablet Take 1 tablet (2 mg total) by mouth daily. 02/25/17  Yes Martinique, [name] M, MD  ferrous sulfate 325 (65 FE) MG EC tablet Take 325 mg by mouth 2 (two) times daily.   Yes [provider]  furosemide (LASIX) 40 MG tablet Take 1 tablet (40 mg total) by mouth daily. 12/23/16  Yes [name], NP  glimepiride (AMARYL) 2 MG tablet TAKE 1 TABLET BY MOUTH AT  BREAKFAST TO CONTROL  GLUCOSE 01/20/17  Yes Estill Dooms, MD  glucose blood (BAYER CONTOUR TEST) test strip Use daily too check glucose 03/08/14  Yes Estill Dooms, MD  Ketotifen Fumarate (ALLERGY EYE DROPS OP) Place 1 drop into both eyes 3 (three) times daily as needed (allergies).    Yes [provider]  levothyroxine (SYNTHROID, LEVOTHROID) 50 MCG tablet Take 1 tablet by mouth  daily for thyroid  supplement 12/23/16  Yes [name], NP  lisinopril (PRINIVIL,ZESTRIL) 10 MG tablet Take 1  tablet (10 mg total) by mouth daily. 10/16/16  Yes Barrett, Erin R, PA-C  metFORMIN (GLUCOPHAGE) 500 MG tablet Take 1 tablet (500 mg total) by mouth 2 (two) times daily with a meal. 01/20/17  Yes Estill Dooms, MD  mirabegron ER (MYRBETRIQ) 25 MG TB24 tablet One daily to help bladder control Patient taking differently: Take 25 mg by mouth daily.  01/20/17  Yes Estill Dooms, MD  pantoprazole (PROTONIX) 40 MG tablet Take 1 tablet (40 mg total) by mouth 2 (two) times daily. 02/21/17  Yes Alveda Reasons, MD  potassium gluconate 595 (99 K) MG TABS tablet Take 595 mg by mouth daily. TAKES ONLY WHEN USING FUROSEMIDE   Yes [provider]  pravastatin (PRAVACHOL) 20 MG tablet TAKE 1 TABLET BY MOUTH  DAILY 08/26/16  Yes Estill Dooms, MD  triamcinolone (KENALOG) 0.025 % cream Apply 1 application topically daily as needed (psoriasis).    Yes [provider]  chlorhexidine (PERIDEX) 0.12 % solution Rinse with 15 mls twice daily for 30 seconds. Use after breakfast and at bedtime. Spit out excess. Do not swallow. Patient not taking: Reported on 03/11/2017 09/22/16   Lenn Cal, DDS  fluticasone furoate-vilanterol (BREO ELLIPTA) 100-25 MCG/INH AEPB Inhale into the lungs once daily 01/20/17   Estill Dooms, MD    Family History Family History  Problem Relation Age of Onset  . Diabetes Father   . Stroke Father   . Heart disease Father   . Liver cancer Sister   . Heart disease Brother   . Asthma Sister   . Arthritis Sister     Social History Social History  Substance Use Topics  . Smoking status: Former Smoker    Packs/day: 1.00    Years: 40.00    Quit date: 09/07/1989  . Smokeless tobacco: Never Used  . Alcohol use No     Allergies   Codeine and Vesicare [solifenacin]   Review of Systems Review of Systems  Constitutional: Negative for fever.  HENT: Negative.   Respiratory: Negative for cough and shortness of breath.   Cardiovascular: Negative.     Gastrointestinal: Positive for blood in stool, nausea and vomiting (bloody).  Genitourinary: Negative.   Neurological: Positive for dizziness.  All other systems reviewed and are negative.    Physical Exam Updated Vital Signs BP (!) 148/49   Pulse 63   Temp 98.4 F (36.9 C) (Oral)  Resp 19   SpO2 (!) 89%   Physical Exam  Constitutional: She is oriented to person, place, and time. She appears well-developed and well-nourished. No distress.  HENT:  Head: Normocephalic and atraumatic.  Mouth/Throat: Oropharynx is clear and moist.  Eyes: Conjunctivae are normal. Pupils are equal, round, and reactive to light.  Neck: Normal range of motion. Neck supple.  Cardiovascular: Normal rate, regular rhythm and normal heart sounds.   No murmur heard. Pulmonary/Chest: Effort normal and breath sounds normal. No respiratory distress.  Abdominal: Soft. There is no tenderness. There is no guarding.  Genitourinary: Rectal exam shows guaiac positive stool (with dark stools).  Musculoskeletal: She exhibits no edema or tenderness.  Neurological: She is alert and oriented to person, place, and time. No cranial nerve deficit. She exhibits normal muscle tone. Coordination normal.  Skin: Skin is warm and dry.  Psychiatric: She has a normal mood and affect.  Nursing note and vitals reviewed.    ED Treatments / Results  Labs (all labs ordered are listed, but only abnormal results are displayed)   EKG  EKG Interpretation None       Radiology No results found.  Procedures Procedures (including critical care time)  Medications Ordered in ED    Initial Impression / Assessment and Plan / ED Course  I have reviewed the triage vital signs and the nursing notes.  Pertinent labs & imaging results that were available during my care of the patient were reviewed by me and considered in my medical decision making (see chart for details).     Patient is a 81 year old female with above past  medical history as well as undergoing a workup for possible stomach ulcer causing anemia with a planned endoscopy in a week who presents with signs and symptoms consistent with symptomatic anemia with worsening presyncopal symptoms with ambulation. Further history and exam as above with reassuring vital signs and dark stools with positive guaiac. Blood work obtained with a hemoglobin of 7.2 as well as aKI with a creatinine at 2.22. She does take eliquis for AV replacement in 2017. At this time concern for symptomatic anemia with AKI, likely from UGIB at this time, thus  patient will be admitted to medicine for further management and evaluation.  Final Clinical Impressions(s) / ED Diagnoses   Final diagnoses:  Symptomatic anemia  AKI (acute kidney injury) [facility])    New Prescriptions Current Discharge Medication List       Heriberto Antigua, MD 03/12/17 [id number]    Gareth Morgan, MD 03/13/17 1300

## 2017-03-11 NOTE — H&P (Signed)
History and Physical    Patricia [name]:[id number] DOB: 05-04-1936 DOA: 03/11/2017  PCP: Estill Dooms, MD  Patient coming from: Home  I have personally briefly reviewed patient's old medical records in [place]  Chief Complaint: Hematemesis  HPI: Patricia [name] is a 81 y.o. female with medical history significant of TAVR in Dec, A.Fib on eliquis.  Patient presents to the ED with c/o hematemesis on Monday and once last week.  She reports having hypotension at home this week which finally prompted her to come to ED.  She hasnt taken BP meds this morning she reports.  She saw PCP in March for abdominal pain.  Work up at that time was hemoccult positive, she was transfused 1 unit PRBC for GI bleed from a presumed stomach ulcer; however, when patient called to make appointment for upper EGD, they apparently couldn't / didn't schedule her for 2 months (upper EGD is scheduled for next week).  HGB was 8.6 last week.  ED Course: HGB 7.2 today in ED.  Creat 2.2 with BUN 36.  BP 157/51.  Hemoccult positive.   Review of Systems: As per HPI otherwise 10 point review of systems negative.   Past Medical History:  Diagnosis Date  . Allergy   . Anemia, unspecified   . Arthritis   . Asthma   . Atrial fibrillation status post cardioversion [facility]) 09/2015   s/p TEE/DCCV>>SR on amio  . Benign neoplasm of colon   . Carpal tunnel syndrome   . Cellulitis and abscess of finger, unspecified   . Cerumen impaction   . Cervicalgia   . CHF (congestive heart failure) ([place])   . Chronic airway obstruction, not elsewhere classified   . Chronic anticoagulation 09/2015   Xarelto for afib, CHADS2VASC=5  . Diarrhea   . Diffuse cystic mastopathy   . Dysrhythmia   . Edema   . Encounter for long-term (current) use of other medications   . GERD (gastroesophageal reflux disease)   . Heart murmur   . Lumbago   . Neck pain 05/23/2015  . Obesity, unspecified   . Other and unspecified hyperlipidemia    . Other psoriasis   . Pain in joint, lower leg   . PONV (postoperative nausea and vomiting)   . Primary localized osteoarthrosis of right shoulder 12/18/2015  . Routine gynecological examination   . 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 Scoliosis (and kyphoscoliosis), idiopathic   . Severe aortic stenosis   . Shortness of breath dyspnea    with exertion  . Type II or unspecified type diabetes mellitus without mention of complication, uncontrolled   . Unspecified essential hypertension   . Unspecified hemorrhoids without mention of complication   . Unspecified hypothyroidism   . Urge incontinence     Past Surgical History:  Procedure Laterality Date  . ABDOMINAL HYSTERECTOMY  1987   complete  . BREAST SURGERY     right breast 3 surgeries [phone]  . CARDIAC CATHETERIZATION N/A 09/02/2016   Procedure: Right/Left Heart Cath and Coronary Angiography;  Surgeon: [name] M Martinique, MD;  Location: [place] [facility];  Service: Cardiovascular;  Laterality: N/A;  . CARDIOVERSION N/A 10/19/2015   Procedure: CARDIOVERSION;  Surgeon: Lelon Perla, MD;  Location: [place];  Service: Cardiovascular;  Laterality: N/A;  . [place]  . COLON SURGERY  [phone].  3 times colon surgieres  . EXCISE LE MANDIBULAR LYMPH NODE T     DR C. NEWMAN  . FOOT SURGERY  1989  . LEFT SHOULDER ARTHROSCOPY    . 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 NEUROPLASTY / TRANSPOSITION MEDIAN NERVE AT CARPAL TUNNEL BILATERAL  05/2003  . TEE WITHOUT CARDIOVERSION N/A 10/19/2015   Procedure: Transesophageal Echocardiogram (TEE) ;  Surgeon: Lelon Perla, MD;  Location: [facility];  Service: Cardiovascular;  Laterality: N/A;  . TEE WITHOUT CARDIOVERSION N/A 10/14/2016   Procedure: TRANSESOPHAGEAL ECHOCARDIOGRAM (TEE);  Surgeon: Sherren Mocha, MD;   Location: [place];  Service: Open Heart Surgery;  Laterality: N/A;  . TOTAL SHOULDER ARTHROPLASTY Right 12/18/2015   Procedure: TOTAL SHOULDER ARTHROPLASTY;  Surgeon: [name], MD;  Location: [place];  Service: Orthopedics;  Laterality: Right;  . TRANSCATHETER AORTIC VALVE REPLACEMENT, TRANSFEMORAL N/A 10/14/2016   Procedure: TRANSCATHETER AORTIC VALVE REPLACEMENT, TRANSFEMORAL;  Surgeon: Sherren Mocha, MD;  Location: [place];  Service: Open Heart Surgery;  Laterality: N/A;  . TRIGGER FINGER RELEASE Left   . VAGINAL CYST REMOVED  1967     reports that she quit smoking about 27 years ago. She has a 40.00 pack-year smoking history. She has never used smokeless tobacco. She reports that she does not drink alcohol or use drugs.  Allergies  Allergen Reactions  . Codeine Other (See Comments)    "spaces her out, dizziness, can't walk well"  . Vesicare [Solifenacin] Itching    Family History  Problem Relation Age of Onset  . Diabetes Father   . Stroke Father   . Heart disease Father   . Liver cancer Sister   . Heart disease Brother   . Asthma Sister   . Arthritis Sister      Prior to Admission medications   Medication Sig Start Date End Date Taking? Authorizing Provider  acetaminophen (TYLENOL) 500 MG tablet Take 500 mg by mouth every 6 (six) hours as needed for mild pain.    [provider]  ADVAIR DISKUS 100-50 MCG/DOSE AEPB Inhale 1 inhalation two  times daily for breathing 07/07/16   Estill Dooms, MD  albuterol (PROVENTIL HFA;VENTOLIN HFA) 108 (90 Base) MCG/ACT inhaler Inhale 2 puffs into the lungs every 6 (six) hours as needed for wheezing or shortness of breath. 01/20/17   Estill Dooms, MD  albuterol (PROVENTIL) (2.5 MG/3ML) 0.083% nebulizer solution Take 3 mLs (2.5 mg total) by nebulization every 4 (four) hours. And as needed 10/28/16   [place], [place], MD  amiodarone (PACERONE) 200 MG tablet Take one tablet daily by mouth 01/20/17   Estill Dooms, MD  apixaban  (ELIQUIS) 5 MG TABS tablet Take 1 tablet (5 mg total) by mouth 2 (two) times daily. 10/16/16   Barrett, Erin R, PA-C  cetirizine (ZYRTEC) 10 MG tablet Take 10 mg by mouth daily as needed for allergies.     [provider]  chlorhexidine (PERIDEX) 0.12 % solution Rinse with 15 mls twice daily for 30 seconds. Use after breakfast and at bedtime. Spit out excess. Do not swallow. 09/22/16   Lenn Cal, DDS  cholecalciferol (VITAMIN D) 1000 units tablet Take 1,000 Units by mouth daily.    [provider]  COMFORT LANCETS MISC Use daily to get blood for glucometer 03/08/14   Estill Dooms, MD  doxazosin (CARDURA) 2 MG tablet Take 1 tablet (2 mg total) by mouth daily. 02/25/17   Martinique, [name] M, MD  ferrous  sulfate 325 (65 FE) MG EC tablet Take 325 mg by mouth 2 (two) times daily.    [provider]  fluticasone furoate-vilanterol (BREO ELLIPTA) 100-25 MCG/INH AEPB Inhale into the lungs once daily 01/20/17   Estill Dooms, MD  furosemide (LASIX) 40 MG tablet Take 1 tablet (40 mg total) by mouth daily. 12/23/16   [name], NP  glimepiride (AMARYL) 2 MG tablet TAKE 1 TABLET BY MOUTH AT  BREAKFAST TO CONTROL  GLUCOSE 01/20/17   Estill Dooms, MD  glucose blood (BAYER CONTOUR TEST) test strip Use daily too check glucose 03/08/14   Estill Dooms, MD  Ketotifen Fumarate (ALLERGY EYE DROPS OP) Apply 1 drop to eye daily as needed (allergies).    [provider]  levothyroxine (SYNTHROID, LEVOTHROID) 50 MCG tablet Take 1 tablet by mouth  daily for thyroid  supplement 12/23/16   [name], NP  lisinopril (PRINIVIL,ZESTRIL) 10 MG tablet Take 1 tablet (10 mg total) by mouth daily. 10/16/16   Barrett, Erin R, PA-C  metFORMIN (GLUCOPHAGE) 500 MG tablet Take 1 tablet (500 mg total) by mouth 2 (two) times daily with a meal. 01/20/17   Estill Dooms, MD  mirabegron ER [facility]) 25 MG TB24 tablet One daily to help bladder control 01/20/17   Estill Dooms, MD    pantoprazole (PROTONIX) 40 MG tablet Take 1 tablet (40 mg total) by mouth 2 (two) times daily. 02/21/17   Alveda Reasons, MD  potassium gluconate 595 (99 K) MG TABS tablet Take 595 mg by mouth daily. TAKES ONLY WHEN USING FUROSEMIDE    [provider]  pravastatin (PRAVACHOL) 20 MG tablet TAKE 1 TABLET BY MOUTH  DAILY 08/26/16   Estill Dooms, MD  triamcinolone (KENALOG) 0.025 % cream Apply 1 application topically daily as needed (psoriasis).     [provider]    Physical Exam: Vitals:   03/11/17 2000 03/11/17 2015 03/11/17 2030 03/11/17 2045  BP: (!) 156/92 (!) 143/43 (!) 154/49 (!) 157/51  Pulse: 70 76 65 65  Resp:      Temp:      TempSrc:      SpO2: 100% 99% 99% 93%    Constitutional: NAD, calm, comfortable Eyes: PERRL, lids and conjunctivae normal ENMT: Mucous membranes are moist. Posterior pharynx clear of any exudate or lesions.Normal dentition.  Neck: normal, supple, no masses, no thyromegaly Respiratory: clear to auscultation bilaterally, no wheezing, no crackles. Normal respiratory effort. No accessory muscle use.  Cardiovascular: Regular rate and rhythm, no murmurs / rubs / gallops. No extremity edema. 2+ pedal pulses. No carotid bruits.  Abdomen: no tenderness, no masses palpated. No hepatosplenomegaly. Bowel sounds positive.  Musculoskeletal: no clubbing / cyanosis. No joint deformity upper and lower extremities. Good ROM, no contractures. Normal muscle tone.  Skin: no rashes, lesions, ulcers. No induration Neurologic: CN 2-12 grossly intact. Sensation intact, DTR normal. Strength 5/5 in all 4.  Psychiatric: Normal judgment and insight. Alert and oriented x 3. Normal mood.    Labs on Admission: I have personally reviewed following labs and imaging studies  CBC:  Recent Labs Lab 03/11/17 1448  WBC 5.5  HGB 7.2*  HCT 24.0*  MCV 88.9  PLT 416   Basic Metabolic Panel:  Recent Labs Lab 03/11/17 1448  NA 136  K 4.1  CL 101  CO2 23   GLUCOSE 217*  BUN 32*  CREATININE 2.22*  CALCIUM 8.6*   GFR: Estimated Creatinine Clearance: 22.9 mL/min (A) (  by C-G formula based on SCr of 2.22 mg/dL (H)). Liver Function Tests:  Recent Labs Lab 03/11/17 1448  AST 30  ALT 34  ALKPHOS 52  BILITOT 0.4  PROT 6.6  ALBUMIN 3.5   No results for input(s): LIPASE, AMYLASE in the last 168 hours. No results for input(s): AMMONIA in the last 168 hours. Coagulation Profile:  Recent Labs Lab 03/11/17 1953  INR 1.29   Cardiac Enzymes: No results for input(s): CKTOTAL, CKMB, CKMBINDEX, TROPONINI in the last 168 hours. BNP (last 3 results) No results for input(s): PROBNP in the last 8760 hours. HbA1C: No results for input(s): HGBA1C in the last 72 hours. CBG:  Recent Labs Lab 03/11/17 2047  GLUCAP 67   Lipid Profile: No results for input(s): CHOL, HDL, LDLCALC, TRIG, CHOLHDL, LDLDIRECT in the last 72 hours. Thyroid Function Tests: No results for input(s): TSH, T4TOTAL, FREET4, T3FREE, THYROIDAB in the last 72 hours. Anemia Panel: No results for input(s): VITAMINB12, FOLATE, FERRITIN, TIBC, IRON, RETICCTPCT in the last 72 hours. Urine analysis:    Component Value Date/Time   COLORURINE YELLOW 10/10/2016 1409   APPEARANCEUR HAZY (A) 10/10/2016 1409   LABSPEC 1.020 10/10/2016 1409   PHURINE 5.0 10/10/2016 1409   GLUCOSEU NEGATIVE 10/10/2016 1409   HGBUR NEGATIVE 10/10/2016 1409   BILIRUBINUR NEGATIVE 10/10/2016 1409   BILIRUBINUR negative 07/18/2015 1337   BILIRUBINUR small 10/07/2012 1544   KETONESUR NEGATIVE 10/10/2016 1409   PROTEINUR 30 (A) 10/10/2016 1409   UROBILINOGEN 0.2 07/18/2015 1337   NITRITE NEGATIVE 10/10/2016 1409   LEUKOCYTESUR NEGATIVE 10/10/2016 1409    Radiological Exams on Admission: No results found.  EKG: Independently reviewed.  Assessment/Plan Principal Problem:   UGIB (upper gastrointestinal bleed) Active Problems:   Essential hypertension   DM type 2 (diabetes mellitus, type 2)  (HCC)   Atrial fibrillation (HCC)   Acute blood loss anemia   S/P TAVR (transcatheter aortic valve replacement)   AKI (acute kidney injury) ([place])    1. UGIB - 1. PPI GTT 2. Call GI in AM 3. Clear liquid diet until MN 4. NPO after MN 2. Acute blood loss anemia - 1. Transfuse 1 unit PRBC 3. A.Fib - 1. Hold Eliquis in face of UGIB, last dose was this morning 4. AKI - 1. Suspect pre-renal / ATN from UGIB 2. Holding lasix, holding lisinopril 3. Transfusing 1 unit PRBC 4. Repeat BMP in AM 5. DM2 - 1. Sensitive scale SSI Q4H 6. HTN - holding BP meds as above given AKI, report of hypotension.  DVT prophylaxis: SCDs Code Status: Full Family Communication: No family in room Disposition Plan: home after admit Consults called: None Admission status: Admit to inpatient   Etta Quill DO [facility] Pager [phone]  If 7AM-7PM, please contact day team taking care of patient [email or web address] Password [facility]  03/11/2017, 9:42 PM

## 2017-03-12 DIAGNOSIS — D62 Acute posthemorrhagic anemia: Secondary | ICD-10-CM

## 2017-03-12 DIAGNOSIS — I480 Paroxysmal atrial fibrillation: Secondary | ICD-10-CM

## 2017-03-12 DIAGNOSIS — N179 Acute kidney failure, unspecified: Secondary | ICD-10-CM

## 2017-03-12 DIAGNOSIS — Z952 Presence of prosthetic heart valve: Secondary | ICD-10-CM

## 2017-03-12 DIAGNOSIS — D649 Anemia, unspecified: Secondary | ICD-10-CM

## 2017-03-12 DIAGNOSIS — I10 Essential (primary) hypertension: Secondary | ICD-10-CM

## 2017-03-12 DIAGNOSIS — K922 Gastrointestinal hemorrhage, unspecified: Secondary | ICD-10-CM

## 2017-03-12 LAB — CBC
HCT: 24.3 % — ABNORMAL LOW (ref 36.0–46.0)
HCT: 28.8 % — ABNORMAL LOW (ref 36.0–46.0)
Hemoglobin: 8 g/dL — ABNORMAL LOW (ref 12.0–15.0)
Hemoglobin: 9.1 g/dL — ABNORMAL LOW (ref 12.0–15.0)
MCH: 28.8 pg (ref 26.0–34.0)
MCH: 27.7 pg (ref 26.0–34.0)
MCHC: 32.9 g/dL (ref 30.0–36.0)
MCHC: 31.6 g/dL (ref 30.0–36.0)
MCV: 87.4 fL (ref 78.0–100.0)
MCV: 87.8 fL (ref 78.0–100.0)
Platelets: 183 K/uL (ref 150–400)
Platelets: 216 10*3/uL (ref 150–400)
RBC: 2.78 MIL/uL — ABNORMAL LOW (ref 3.87–5.11)
RBC: 3.28 MIL/uL — ABNORMAL LOW (ref 3.87–5.11)
RDW: 17.6 % — ABNORMAL HIGH (ref 11.5–15.5)
RDW: 17.7 % — ABNORMAL HIGH (ref 11.5–15.5)
WBC: 6 K/uL (ref 4.0–10.5)
WBC: 6.7 10*3/uL (ref 4.0–10.5)

## 2017-03-12 LAB — BASIC METABOLIC PANEL WITH GFR
Anion gap: 8 (ref 5–15)
BUN: 33 mg/dL — ABNORMAL HIGH (ref 6–20)
CO2: 23 mmol/L (ref 22–32)
Calcium: 8.2 mg/dL — ABNORMAL LOW (ref 8.9–10.3)
Chloride: 106 mmol/L (ref 101–111)
Creatinine, Ser: 1.94 mg/dL — ABNORMAL HIGH (ref 0.44–1.00)
GFR calc Af Amer: 27 mL/min — ABNORMAL LOW
GFR calc non Af Amer: 24 mL/min — ABNORMAL LOW
Glucose, Bld: 90 mg/dL (ref 65–99)
Potassium: 4.1 mmol/L (ref 3.5–5.1)
Sodium: 137 mmol/L (ref 135–145)

## 2017-03-12 LAB — GLUCOSE, CAPILLARY
Glucose-Capillary: 156 mg/dL — ABNORMAL HIGH (ref 65–99)
Glucose-Capillary: 169 mg/dL — ABNORMAL HIGH (ref 65–99)
Glucose-Capillary: 78 mg/dL (ref 65–99)
Glucose-Capillary: 88 mg/dL (ref 65–99)
Glucose-Capillary: 94 mg/dL (ref 65–99)
Glucose-Capillary: 95 mg/dL (ref 65–99)

## 2017-03-12 LAB — TYPE AND SCREEN
ABO/RH(D): A POS
Antibody Screen: NEGATIVE
Unit division: 0

## 2017-03-12 LAB — BPAM RBC
Blood Product Expiration Date: 201805202359
ISSUE DATE / TIME: 201805162127
Unit Type and Rh: 600

## 2017-03-12 MED ORDER — ALBUTEROL SULFATE (2.5 MG/3ML) 0.083% IN NEBU
2.5000 mg | INHALATION_SOLUTION | Freq: Four times a day (QID) | RESPIRATORY_TRACT | Status: DC
  Filled 2017-03-12: qty 3

## 2017-03-12 MED ORDER — INSULIN ASPART 100 UNIT/ML ~~LOC~~ SOLN
0.0000 [IU] | Freq: Three times a day (TID) | SUBCUTANEOUS | Status: DC
  Administered 2017-03-13: 1 [IU] via SUBCUTANEOUS

## 2017-03-12 MED ORDER — SODIUM CHLORIDE 0.9 % IV SOLN
INTRAVENOUS | Status: AC
  Administered 2017-03-12: 17:00:00 via INTRAVENOUS

## 2017-03-12 MED ORDER — PRAVASTATIN SODIUM 20 MG PO TABS
20.0000 mg | ORAL_TABLET | Freq: Every day | ORAL | Status: DC
  Administered 2017-03-12 – 2017-03-13 (×2): 20 mg via ORAL
  Filled 2017-03-12 (×2): qty 1

## 2017-03-12 MED ORDER — DOXAZOSIN MESYLATE 2 MG PO TABS
2.0000 mg | ORAL_TABLET | Freq: Every day | ORAL | Status: DC
  Administered 2017-03-12 – 2017-03-13 (×2): 2 mg via ORAL
  Filled 2017-03-12 (×2): qty 1

## 2017-03-12 NOTE — Progress Notes (Addendum)
PROGRESS NOTE   DELISA FINCK  WVP:[id number]    DOB: [birth year]    DOA: 03/11/2017  PCP: Estill Dooms, MD   I have briefly reviewed patients previous medical records in [facility].  Brief Narrative:  81 year old female with PMH of PAF, s/p TEE/DCCV> on Amiodarone & Eliquis, TAVR (Dr. Martinique), GERD, HLD, DM 2, HTN, hypothyroid who presented with 2 episodes of coffee-ground emesis a week prior to admission then on 03/10/15, dizzy and lightheaded without syncope. She has chronically black stools while on PO Iron. Did not report blood in emesis or stools. She was supposed to see her GI in [facility] for an EGD today but due to ongoing symptoms, her gastroenterologist recommended hospital admission. [facility] consulted. Plan EGD on 5/18. Anticoagulation on hold.   Assessment & Plan:   Principal Problem:   UGIB (upper gastrointestinal bleed) Active Problems:   Essential hypertension   DM type 2 (diabetes mellitus, type 2) (HCC)   Atrial fibrillation (HCC)   Acute blood loss anemia   S/P TAVR (transcatheter aortic valve replacement)   AKI (acute kidney injury) ([place])   1. Acute upper GI bleed: In the context of Eliquis, last dose taken on morning of 5/16, currently on hold. DD: Gastritis, esophagitis, PUD versus other etiologies. GI input appreciated and discussed with Dr. Therisa Doyne. Continue IV Protonix infusion, full liquid diet for today and EGD planned for 5/18. Does not seem to be overtly bleeding at this time. 2. Acute blood loss anemia, complicating chronic anemia: Presented with hemoglobin of 7.2. Status post 1 unit PRBC transfusion overnight of admission. Hemoglobin improved to 8 > 9.1. Follow CBC in a.m. Baseline hemoglobin 8-9. Etiology of chronic anemia is unknown, some of it could be due to chronic kidney disease. Eventually needs screening colonoscopy too. 3. Acute on stage III chronic kidney disease: Baseline creatinine may be in the 1.4-1.6 range. Presented with creatinine of  2.2. Brief gentle IV fluids and follow BMP. Holding Lasix and lisinopril. 4. Paroxysmal A. fib: Continue amiodarone. Anticoagulation on hold. 5. GERD: Continue Protonix as above. 6. Essential hypertension: Mildly uncontrolled. Lisinopril on hold. Resume Doxazosin 2 MG daily. 7. Type II DM: Change NovoLog SSI to before meals and at bedtime. Hold by mouth meds. 8. Hypothyroid: Continue Synthroid. Stable. 9. Chronic diastolic CHF: 2-D echo 5/46/27: LVEF 65-70 percent and grade 2 diastolic dysfunction. Clinically compensated. Lasix temporarily on hold.   DVT prophylaxis: SCDs Code Status: Full Family Communication: None at bedside Disposition: DC home possibly post procedure on 5/18   Consultants:  [facility]   Procedures:  None  Antimicrobials:  None    Subjective: No further coffee ground emesis since 5/15. Ongoing "dark stools" that are unchanged for several months. No abdominal pain. Dizziness and lightheadedness has resolved.   ROS: No chest pain, dyspnea or palpitations. No blood in stools.  Objective:  Vitals:   03/12/17 0007 03/12/17 0009 03/12/17 0054 03/12/17 0437  BP: (!) 177/50 (!) 165/50  (!) 149/48  Pulse: 71 71  67  Resp:    18  Temp:    98.3 F (36.8 C)  TempSrc:    Oral  SpO2: 97% 98%  95%  Weight:   93.9 kg (207 lb)   Height:   5' 3.5" (1.613 m)     Examination:  General exam: Pleasant elderly female sitting up comfortably at edge of bed this morning. Respiratory system: Clear to auscultation. Respiratory effort normal. Cardiovascular system: S1 & S2 heard, RRR. No  JVD, murmurs, rubs, gallops or clicks. No pedal edema. Telemetry: Sinus rhythm with first-degree AV block and BBB morphology. Gastrointestinal system: Abdomen is nondistended, soft and nontender. No organomegaly or masses felt. Normal bowel sounds heard. Central nervous system: Alert and oriented. No focal neurological deficits. Extremities: Symmetric 5 x 5 power. Skin: No rashes, lesions or  ulcers Psychiatry: Judgement and insight appear normal. Mood & affect appropriate.     Data Reviewed: I have personally reviewed following labs and imaging studies  CBC:  Recent Labs Lab 03/11/17 1448 03/12/17 0143 03/12/17 0749  WBC 5.5 6.0 6.7  HGB 7.2* 8.0* 9.1*  HCT 24.0* 24.3* 28.8*  MCV 88.9 87.4 87.8  PLT 238 183 982   Basic Metabolic Panel:  Recent Labs Lab 03/11/17 1448 03/12/17 0143  NA 136 137  K 4.1 4.1  CL 101 106  CO2 23 23  GLUCOSE 217* 90  BUN 32* 33*  CREATININE 2.22* 1.94*  CALCIUM 8.6* 8.2*   Liver Function Tests:  Recent Labs Lab 03/11/17 1448  AST 30  ALT 34  ALKPHOS 52  BILITOT 0.4  PROT 6.6  ALBUMIN 3.5   Coagulation Profile:  Recent Labs Lab 03/11/17 1953  INR 1.29   Cardiac Enzymes: No results for input(s): CKTOTAL, CKMB, CKMBINDEX, TROPONINI in the last 168 hours. HbA1C: No results for input(s): HGBA1C in the last 72 hours. CBG:  Recent Labs Lab 03/11/17 2047 03/12/17 0002 03/12/17 0440 03/12/17 0804 03/12/17 1200  GLUCAP 67 94 78 88 169*    No results found for this or any previous visit (from the past 240 hour(s)).       Radiology Studies: No results found.      Scheduled Meds: . amiodarone  200 mg Oral Daily  . ferrous sulfate  325 mg Oral BID  . fluticasone furoate-vilanterol  1 puff Inhalation Daily  . insulin aspart  0-9 Units Subcutaneous Q4H  . levothyroxine  50 mcg Oral QAC breakfast  . loratadine  10 mg Oral Daily  . mirabegron ER  25 mg Oral Daily  . [START ON 03/15/2017] pantoprazole  40 mg Intravenous Q12H  . sodium chloride flush  3 mL Intravenous Q12H   Continuous Infusions: . sodium chloride    . pantoprozole (PROTONIX) infusion 8 mg/hr (03/12/17 1113)     LOS: 1 day     Shanette Tamargo, MD, FACP, FHM. [facility] Pager [phone]  If 7PM-7AM, please contact night-coverage [email or web address] Password [email or web address] 03/12/2017, 1:41 PM

## 2017-03-12 NOTE — Care Management Note (Addendum)
Case Management Note  Patient Details  Name: Patricia [name] MRN: [id number] Date of Birth:  Subjective/Objective:           Admitted with hematemesis/ GI BLEED, hx of TAVR in Dec, A.Fib on eliquis.   [name] (Daughter)     [phone]      PCP: Jeanmarie Hubert  Action/Plan:  EGD in a.m, 518/2018. GI following. Discharge planning in process...Marland KitchenMarland KitchenCM to f/u with disposition needs.  Expected Discharge Date:                  Expected Discharge Plan:  Home/Self Care  In-House Referral:     Discharge planning Services  CM Consult  Post Acute Care Choice:    Choice offered to:     DME Arranged:    DME Agency:     HH Arranged:    HH Agency:     Status of Service:  In process, will continue to follow  If discussed at Long Length of Stay Meetings, dates discussed:    Additional Comments:  Sharin Mons, RN 03/12/2017, 12:55 PM

## 2017-03-12 NOTE — Consult Note (Signed)
[facility] Consult  Referring Provider: Rockney Ghee Primary Care Physician:  Estill Dooms, MD Primary Gastroenterologist: Dr.Shearin([place], [place])  Reason for Consultation: Coffee ground emesis, anemia  HPI: Patricia [name] is a 81 y.o. Caucasian female is admitted on 03/11/2017 with complaints of coffee-ground emesis. Patient states that she was in the usual state of health until last week when she developed dizziness and nausea and had several episodes of vomiting. This occurred once last week, and again once 4 days ago.She noticed that the color of the vomitus waas dark brown. She has noticed that her stools have always been black as she has been on iron for the last several months. She denies bloody bowel movements. Because of ongoing dizziness and nausea she was evaluated by an ENT physician who did not deem that the symptoms were because of inner ear dysfunction. She was supposed to get an EGD as an outpatient today, however, because she had ongoing dizziness and episodes of coffee-ground emesis her gastroenterologist suggested that she be admitted for further investigation. Patient reports having an EGD and colonoscopy performed in March 2017. She takes Protonix on a regular basis twice a day. In the past she has had post polypectomy bleeding requiring admission as well as multiple blood transfusions. Recently she denies loss of appetite or unintentional weight loss. She denies difficulty swallowing or pain on swallowing. She also denies early satiety, bloating but complains of mild acid reflux and heartburn intermittently. Her last dose of Eliquis was yesterday morning.   Past Medical History:  Diagnosis Date  . Allergy   . Anemia, unspecified   . Arthritis   . Asthma   . Atrial fibrillation status post cardioversion [facility]) 09/2015   s/p TEE/DCCV>>SR on amio  . Benign neoplasm of colon   . Carpal tunnel syndrome   . Cellulitis and abscess of finger,  unspecified   . Cerumen impaction   . Cervicalgia   . CHF (congestive heart failure) ([place])   . Chronic airway obstruction, not elsewhere classified   . Chronic anticoagulation 09/2015   Xarelto for afib, CHADS2VASC=5  . Diarrhea   . Diffuse cystic mastopathy   . Dysrhythmia   . Edema   . Encounter for long-term (current) use of other medications   . GERD (gastroesophageal reflux disease)   . Heart murmur   . Lumbago   . Neck pain 05/23/2015  . Obesity, unspecified   . Other and unspecified hyperlipidemia   . Other psoriasis   . Pain in joint, lower leg   . PONV (postoperative nausea and vomiting)   . Primary localized osteoarthrosis of right shoulder 12/18/2015  . Routine gynecological examination   . 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 Scoliosis (and kyphoscoliosis), idiopathic   . Severe aortic stenosis   . Shortness of breath dyspnea    with exertion  . Type II or unspecified type diabetes mellitus without mention of complication, uncontrolled   . Unspecified essential hypertension   . Unspecified hemorrhoids without mention of complication   . Unspecified hypothyroidism   . Urge incontinence     Past Surgical History:  Procedure Laterality Date  . ABDOMINAL HYSTERECTOMY  1987   complete  . BREAST SURGERY     right breast 3 surgeries [phone]  . CARDIAC CATHETERIZATION N/A 09/02/2016   Procedure: Right/Left Heart Cath and Coronary Angiography;  Surgeon: [name] M Martinique, MD;  Location: [facility];  Service: Cardiovascular;  Laterality: N/A;  . CARDIOVERSION N/A 10/19/2015   Procedure: CARDIOVERSION;  Surgeon: Lelon Perla, MD;  Location: [place];  Service: Cardiovascular;  Laterality: N/A;  . [place]  . COLON SURGERY     2004,2007,2013.  3 times colon surgieres  . EXCISE LE MANDIBULAR LYMPH  NODE T     DR C. NEWMAN  . FOOT SURGERY  1989  . LEFT SHOULDER ARTHROSCOPY    . 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 NEUROPLASTY / TRANSPOSITION MEDIAN NERVE AT CARPAL TUNNEL BILATERAL  05/2003  . TEE WITHOUT CARDIOVERSION N/A 10/19/2015   Procedure: Transesophageal Echocardiogram (TEE) ;  Surgeon: Lelon Perla, MD;  Location: [facility] ENDOSCOPY;  Service: Cardiovascular;  Laterality: N/A;  . TEE WITHOUT CARDIOVERSION N/A 10/14/2016   Procedure: TRANSESOPHAGEAL ECHOCARDIOGRAM (TEE);  Surgeon: Sherren Mocha, MD;  Location: [place];  Service: Open Heart Surgery;  Laterality: N/A;  . TOTAL SHOULDER ARTHROPLASTY Right 12/18/2015   Procedure: TOTAL SHOULDER ARTHROPLASTY;  Surgeon: [name], MD;  Location: [place];  Service: Orthopedics;  Laterality: Right;  . TRANSCATHETER AORTIC VALVE REPLACEMENT, TRANSFEMORAL N/A 10/14/2016   Procedure: TRANSCATHETER AORTIC VALVE REPLACEMENT, TRANSFEMORAL;  Surgeon: Sherren Mocha, MD;  Location: [place];  Service: Open Heart Surgery;  Laterality: N/A;  . TRIGGER FINGER RELEASE Left   . VAGINAL CYST REMOVED  1967    Prior to Admission medications   Medication Sig Start Date End Date Taking? Authorizing Provider  acetaminophen (TYLENOL) 500 MG tablet Take 500 mg by mouth every 6 (six) hours as needed for mild pain.   Yes [provider]  ADVAIR DISKUS 100-50 MCG/DOSE AEPB Inhale 1 inhalation two  times daily for breathing 07/07/16  Yes Estill Dooms, MD  albuterol (PROVENTIL HFA;VENTOLIN HFA) 108 (90 Base) MCG/ACT inhaler Inhale 2 puffs into the lungs every 6 (six) hours as needed for wheezing or shortness of breath. 01/20/17  Yes Estill Dooms, MD  albuterol (PROVENTIL) (2.5 MG/3ML) 0.083% nebulizer solution Take 3 mLs (2.5 mg total) by nebulization every 4 (four) hours. And as needed Patient taking differently: Take 2.5 mg by nebulization every 4 (four) hours as needed for wheezing or shortness of breath.  10/28/16  Yes [place], [place], MD  amiodarone  (PACERONE) 200 MG tablet Take one tablet daily by mouth Patient taking differently: Take 200 mg by mouth daily.  01/20/17  Yes Estill Dooms, MD  apixaban (ELIQUIS) 5 MG TABS tablet Take 1 tablet (5 mg total) by mouth 2 (two) times daily. 10/16/16  Yes Barrett, Erin R, PA-C  cetirizine (ZYRTEC) 10 MG tablet Take 10 mg by mouth daily as needed for allergies.    Yes [provider]  cholecalciferol (VITAMIN D) 1000 units tablet Take 1,000 Units by mouth daily.   Yes [provider]  COMFORT LANCETS MISC Use daily to get blood for glucometer 03/08/14  Yes Estill Dooms, MD  doxazosin (CARDURA) 2 MG tablet Take 1 tablet (2 mg total) by mouth daily. 02/25/17  Yes Martinique, [name] M, MD  ferrous sulfate 325 (65 FE) MG EC tablet Take 325 mg by mouth 2 (two) times daily.   Yes [provider]  furosemide (LASIX) 40 MG tablet Take 1 tablet (40 mg total) by mouth daily. 12/23/16  Yes [name], NP  glimepiride (AMARYL) 2 MG tablet TAKE 1 TABLET BY MOUTH AT  BREAKFAST TO CONTROL  GLUCOSE 01/20/17  Yes Estill Dooms, MD  glucose blood (BAYER CONTOUR TEST) test strip Use daily too check glucose 03/08/14  Yes Estill Dooms, MD  Ketotifen Fumarate (ALLERGY EYE DROPS OP) Place 1 drop into both eyes 3 (three) times daily as needed (allergies).    Yes [provider]  levothyroxine (SYNTHROID, LEVOTHROID) 50 MCG tablet Take 1 tablet by mouth  daily for thyroid  supplement 12/23/16  Yes [name], NP  lisinopril (PRINIVIL,ZESTRIL) 10 MG tablet Take 1 tablet (10 mg total) by mouth daily. 10/16/16  Yes Barrett, Erin R, PA-C  metFORMIN (GLUCOPHAGE) 500 MG tablet Take 1 tablet (500 mg total) by mouth 2 (two) times daily with a meal. 01/20/17  Yes Estill Dooms, MD  mirabegron ER (MYRBETRIQ) 25 MG TB24 tablet One daily to help bladder control Patient taking differently: Take 25 mg by mouth daily.  01/20/17  Yes Estill Dooms, MD  pantoprazole (PROTONIX) 40 MG tablet Take  1 tablet (40 mg total) by mouth 2 (two) times daily. 02/21/17  Yes Alveda Reasons, MD  potassium gluconate 595 (99 K) MG TABS tablet Take 595 mg by mouth daily. TAKES ONLY WHEN USING FUROSEMIDE   Yes [provider]  pravastatin (PRAVACHOL) 20 MG tablet TAKE 1 TABLET BY MOUTH  DAILY 08/26/16  Yes Estill Dooms, MD  triamcinolone (KENALOG) 0.025 % cream Apply 1 application topically daily as needed (psoriasis).    Yes [provider]  chlorhexidine (PERIDEX) 0.12 % solution Rinse with 15 mls twice daily for 30 seconds. Use after breakfast and at bedtime. Spit out excess. Do not swallow. Patient not taking: Reported on 03/11/2017 09/22/16   Lenn Cal, DDS  fluticasone furoate-vilanterol (BREO ELLIPTA) 100-25 MCG/INH AEPB Inhale into the lungs once daily 01/20/17   Estill Dooms, MD    Current Facility-Administered Medications  Medication Dose Route Frequency Provider Last Rate Last Dose  . 0.9 %  sodium chloride infusion   Intravenous Once Etta Quill, DO      . acetaminophen (TYLENOL) tablet 650 mg  650 mg Oral Q6H PRN Etta Quill, DO       Or  . acetaminophen (TYLENOL) suppository 650 mg  650 mg Rectal Q6H PRN Etta Quill, DO      . albuterol (PROVENTIL) (2.5 MG/3ML) 0.083% nebulizer solution 2.5 mg  2.5 mg Inhalation Q6H PRN Etta Quill, DO      . albuterol (PROVENTIL) (2.5 MG/3ML) 0.083% nebulizer solution 2.5 mg  2.5 mg Nebulization QID Heriberto Antigua, MD      . amiodarone (PACERONE) tablet 200 mg  200 mg Oral Daily Alcario Drought, Jared M, DO      . ferrous sulfate tablet 325 mg  325 mg Oral BID Alcario Drought, Jared M, DO      . fluticasone furoate-vilanterol (BREO ELLIPTA) 100-25 MCG/INH 1 puff  1 puff Inhalation Daily Alcario Drought, Jared M, DO      . insulin aspart (novoLOG) injection 0-9 Units  0-9 Units Subcutaneous Q4H Alcario Drought, Jared M, DO      . levothyroxine (SYNTHROID, LEVOTHROID) tablet 50 mcg  50 mcg Oral QAC breakfast Jennette Kettle M, DO      .  loratadine (CLARITIN) tablet 10 mg  10 mg Oral Daily Jennette Kettle M, DO      . mirabegron ER [facility]) tablet 25 mg  25 mg Oral Daily Alcario Drought, Jared M, DO      . ondansetron [facility]) tablet 4 mg  4 mg Oral  Q6H PRN Etta Quill, DO       Or  . ondansetron [facility]) injection 4 mg  4 mg Intravenous Q6H PRN Etta Quill, DO      . pantoprazole (PROTONIX) 80 mg in sodium chloride 0.9 % 250 mL (0.32 mg/mL) infusion  8 mg/hr Intravenous Continuous Etta Quill, DO 25 mL/hr at 03/12/17 0002 8 mg/hr at 03/12/17 0002  . [START ON 03/15/2017] pantoprazole (PROTONIX) injection 40 mg  40 mg Intravenous Q12H Jennette Kettle M, DO      . sodium chloride flush (NS) 0.9 % injection 3 mL  3 mL Intravenous Q12H Jennette Kettle M, DO   3 mL at 03/11/17 2159    Allergies as of 03/11/2017 - Review Complete 03/11/2017  Allergen Reaction Noted  . Codeine Other (See Comments) 10/07/2012  . Vesicare [solifenacin] Itching 03/02/2013    Family History  Problem Relation Age of Onset  . Diabetes Father   . Stroke Father   . Heart disease Father   . Liver cancer Sister   . Heart disease Brother   . Asthma Sister   . Arthritis Sister     Social History   Social History  . Marital status: Divorced    Spouse name: N/A  . Number of children: 1  . Years of education: N/A   Occupational History  . Retired Market researcher for [employer] History Main Topics  . Smoking status: Former Smoker    Packs/day: 1.00    Years: 40.00    Quit date: 09/07/1989  . Smokeless tobacco: Never Used  . Alcohol use No  . Drug use: No  . Sexual activity: No   Other Topics Concern  . Not on file   Social History Narrative   Her daughter & 3 grandchildren live in the area.      Review of Systems: Positive EXN:[id number], dizziness, coffee-ground emesis, black stools GI: Described in detail in HPI.    Gen: Denies any fever, chills, rigors, night sweats, anorexia, fatigue, malaise, involuntary  weight loss, and sleep disorder CV: Denies chest pain, angina, palpitations, syncope, orthopnea, PND, peripheral edema, and claudication. Resp: Denies dyspnea, cough, sputum, wheezing, coughing up blood. GU : Denies urinary burning, blood in urine, urinary frequency, urinary hesitancy, nocturnal urination, and urinary incontinence. MS: Denies joint pain or swelling.  Denies muscle weakness, cramps, atrophy.  Derm: Denies rash, itching, oral ulcerations, hives, unhealing ulcers.  Psych: Denies depression, anxiety, memory loss, suicidal ideation, hallucinations,  and confusion. Heme: Denies bruising, bleeding, and enlarged lymph nodes. Neuro:  Denies any headaches, paresthesias. Endo:  She has DM and hypothyroidism, adrenal function.  Physical Exam: Vital signs in last 24 hours: Temp:  [98 F (36.7 C)-98.4 F (36.9 C)] 98.3 F (36.8 C) (05/17 0437) Pulse Rate:  [63-76] 67 (05/17 0437) Resp:  [14-19] 18 (05/17 0437) BP: (110-177)/(40-92) 149/48 (05/17 0437) SpO2:  [89 %-100 %] 95 % (05/17 0437) Weight:  [93.9 kg (207 lb)] 93.9 kg (207 lb) (05/17 0054) Last BM Date: 03/11/17  General:   Alert,  Well-developed, well-nourished, pleasant and cooperative in NAD Head:  Normocephalic and atraumatic. Eyes:  Sclera clear, no icterus.   Conjunctiva pale. Ears:  Normal auditory acuity. Nose:  No deformity, discharge,  or lesions. Mouth:  No deformity or lesions.  Oropharynx pink & moist. Neck:  Supple; no masses or thyromegaly. Lungs:  Clear throughout to auscultation.   No wheezes, crackles, or rhonchi. No acute distress. Heart:  irregular  rate and rhythm; no murmurs, clicks, rubs,  or gallops. Extremities:  Without clubbing or edema. Neurologic:  Alert and  oriented x4;  grossly normal neurologically. Skin:  Intact without significant lesions or rashes. Psych:  Alert and cooperative. Normal mood and affect. Abdomen:  Soft, nontender and nondistended. No masses, hepatosplenomegaly or hernias  noted. Normal bowel sounds, without guarding, and without rebound.         Lab Results:  Recent Labs  03/11/17 1448 03/12/17 0143 03/12/17 0749  WBC 5.5 6.0 6.7  HGB 7.2* 8.0* 9.1*  HCT 24.0* 24.3* 28.8*  PLT 238 183 216   BMET  Recent Labs  03/11/17 1448 03/12/17 0143  NA 136 137  K 4.1 4.1  CL 101 106  CO2 23 23  GLUCOSE 217* 90  BUN 32* 33*  CREATININE 2.22* 1.94*  CALCIUM 8.6* 8.2*   LFT  Recent Labs  03/11/17 1448  PROT 6.6  ALBUMIN 3.5  AST 30  ALT 34  ALKPHOS 52  BILITOT 0.4   PT/INR  Recent Labs  03/11/17 1953  LABPROT 16.2*  INR 1.29    Studies/Results: No results found.  Colonoscopy from October 2013 showed a  Left sided diverticulosis and 1 sessile polyp each in ascending and sigmoid colon.  Impression: Coffee-ground emesis, dark stools, normocytic anemia Hb 7.2 on admission, subsequently 8-9.1 after 1 unit PRBC transfusion, elevated BUN/creatinine ratio(33/1.94), FOBT positive stool Differential diagnosis includes Mallory-Weiss tear, peptic ulcer disease and less likely malignancy  Plan: Plan diagnostic EGD in a.m., okay to put patient on full liquid diet and keep nothing by mouth post midnight. Continue to hold Eliquis until EGD is performed. Currently on IV Protonix drip at 8 mg per hour and remains hemodynamically stable   LOS: 1 day   Gari Crown   03/12/2017, 8:51 AM  Pager [phone] If no answer or after 5 PM call [phone]

## 2017-03-12 NOTE — Progress Notes (Signed)
NURSING PROGRESS NOTE  Patricia [name] [id number] Admission Data: 03/12/2017 1:16 AM Attending Provider: Etta Quill, DO YJW:[id number], Viviann Spare, MD Code Status: Full  Patricia [name] is a 81 y.o. female patient admitted from ED:  -No acute distress noted.  -No complaints of shortness of breath.  -No complaints of chest pain.   Cardiac Monitoring: Box # 15 in place. Cardiac monitor yields:normal sinus rhythm.  Blood pressure (!) 165/50, pulse 71, temperature 98.1 F (36.7 C), temperature source Oral, resp. rate 18, height 5' 3.5" (1.613 m), weight 93.9 kg (207 lb), SpO2 98 %.   IV Fluids:  IV in place, occlusive dsg intact without redness, IV cath wrist right, condition patent and no redness and antecubital left, condition patent and no redness   Fluids: Protonix 70m/hr  Allergies:  Codeine and Vesicare [solifenacin]  Past Medical History:   has a past medical history of Allergy; Anemia, unspecified; Arthritis; Asthma; Atrial fibrillation status post cardioversion ([facility] (09/2015); Benign neoplasm of colon; Carpal tunnel syndrome; Cellulitis and abscess of finger, unspecified; Cerumen impaction; Cervicalgia; CHF (congestive heart failure) ([place]; Chronic airway obstruction, not elsewhere classified; Chronic anticoagulation (09/2015); Diarrhea; Diffuse cystic mastopathy; Dysrhythmia; Edema; Encounter for long-term (current) use of other medications; GERD (gastroesophageal reflux disease); Heart murmur; Lumbago; Neck pain (05/23/2015); Obesity, unspecified; Other and unspecified hyperlipidemia; Other psoriasis; Pain in joint, lower leg; PONV (postoperative nausea and vomiting); Primary localized osteoarthrosis of right shoulder (12/18/2015); Routine gynecological examination; S/P TAVR (transcatheter aortic valve replacement) (10/14/2016); Scoliosis (and kyphoscoliosis), idiopathic; Severe aortic stenosis; Shortness of breath dyspnea; Type II or unspecified type diabetes mellitus without mention of  complication, uncontrolled; Unspecified essential hypertension; Unspecified hemorrhoids without mention of complication; Unspecified hypothyroidism; and Urge incontinence.  Past Surgical History:   has a past surgical history that includes Breast surgery; Cholecystectomy (1992); Colon surgery; Abdominal hysterectomy (1987); VAGINAL CYST REMOVED (1967); MUCINOUS CYSTADENOMA (11/1985); Foot surgery (1989); EXCISE LE MANDIBULAR LYMPH NODE T; LEFT SHOULDER ARTHROSCOPY; Neuroplasty / transposition median nerve at carpal tunnel bilateral (05/2003); TEE without cardioversion (N/A, 10/19/2015); Cardioversion (N/A, 10/19/2015); Trigger finger release (Left); Total shoulder arthroplasty (Right, 12/18/2015); Cardiac catheterization (N/A, 09/02/2016); Transcatheter aortic valve replacement, transfemoral (N/A, 10/14/2016); and TEE without cardioversion (N/A, 10/14/2016).  Social History:   reports that she quit smoking about 27 years ago. She has a 40.00 pack-year smoking history. She has never used smokeless tobacco. She reports that she does not drink alcohol or use drugs.  Skin: intact, pale  Patient/Family orientated to room. Information packet given to patient/family. Admission inpatient armband information verified with patient/family to include name and date of birth and placed on patient arm. Side rails up x 2, fall assessment and education completed with patient/family. Patient/family able to verbalize understanding of risk associated with falls and verbalized understanding to call for assistance before getting out of bed. Call light within reach. Patient/family able to voice and demonstrate understanding of unit orientation instructions.

## 2017-03-12 NOTE — Consult Note (Signed)
[facility] CM Inpatient Consult   03/12/2017  Patricia [name] 1936/05/05 [id number]   Patient assessed for admissions in the [facility].  Patient admitted for Symptomatic anemia with a HX of TVAR and scheduled for an EGD tomorrow.  Spoke with inpatient RNCM, Levada Dy.  No current [facility] Care Management needs noted at this time. Primary Care Provider: Jeanmarie Hubert, [facility] who provides the patient's transition of care follow up calls.  For questions, please contact:  Natividad Brood, RN BSN [facility] Liaison  [phone] business mobile phone Toll free office [phone]

## 2017-03-13 ENCOUNTER — Encounter (HOSPITAL_COMMUNITY): Payer: Self-pay | Admitting: Emergency Medicine

## 2017-03-13 ENCOUNTER — Encounter (HOSPITAL_COMMUNITY)
Admission: EM | Disposition: A | Discharge status: Home / Self Care | Payer: Self-pay | Source: Home / Self Care | Attending: Internal Medicine

## 2017-03-13 ENCOUNTER — Inpatient Hospital Stay (HOSPITAL_COMMUNITY): Payer: Medicare Other | Admitting: Anesthesiology

## 2017-03-13 ENCOUNTER — Encounter (HOSPITAL_COMMUNITY): Payer: Medicare Other

## 2017-03-13 HISTORY — PX: ESOPHAGOGASTRODUODENOSCOPY (EGD) WITH PROPOFOL: SHX5813

## 2017-03-13 LAB — BASIC METABOLIC PANEL
Anion gap: 8 (ref 5–15)
BUN: 19 mg/dL (ref 6–20)
CO2: 22 mmol/L (ref 22–32)
Calcium: 8.5 mg/dL — ABNORMAL LOW (ref 8.9–10.3)
Chloride: 110 mmol/L (ref 101–111)
Creatinine, Ser: 1.45 mg/dL — ABNORMAL HIGH (ref 0.44–1.00)
GFR calc Af Amer: 39 mL/min — ABNORMAL LOW (ref >60)
GFR calc non Af Amer: 34 mL/min — ABNORMAL LOW (ref >60)
Glucose, Bld: 112 mg/dL — ABNORMAL HIGH (ref 65–99)
Potassium: 4.7 mmol/L (ref 3.5–5.1)
Sodium: 140 mmol/L (ref 135–145)

## 2017-03-13 LAB — CBC
HCT: 23.9 % — ABNORMAL LOW (ref 36.0–46.0)
HCT: 24.7 % — ABNORMAL LOW (ref 36.0–46.0)
Hemoglobin: 7.7 g/dL — ABNORMAL LOW (ref 12.0–15.0)
Hemoglobin: 7.6 g/dL — ABNORMAL LOW (ref 12.0–15.0)
MCH: 28.1 pg (ref 26.0–34.0)
MCH: 27.4 pg (ref 26.0–34.0)
MCHC: 32.2 g/dL (ref 30.0–36.0)
MCHC: 30.8 g/dL (ref 30.0–36.0)
MCV: 87.2 fL (ref 78.0–100.0)
MCV: 89.2 fL (ref 78.0–100.0)
Platelets: 194 10*3/uL (ref 150–400)
Platelets: 201 10*3/uL (ref 150–400)
RBC: 2.74 MIL/uL — ABNORMAL LOW (ref 3.87–5.11)
RBC: 2.77 MIL/uL — ABNORMAL LOW (ref 3.87–5.11)
RDW: 18.3 % — ABNORMAL HIGH (ref 11.5–15.5)
RDW: 18.2 % — ABNORMAL HIGH (ref 11.5–15.5)
WBC: 5.3 10*3/uL (ref 4.0–10.5)
WBC: 5.1 10*3/uL (ref 4.0–10.5)

## 2017-03-13 LAB — GLUCOSE, CAPILLARY
Glucose-Capillary: 123 mg/dL — ABNORMAL HIGH (ref 65–99)
Glucose-Capillary: 133 mg/dL — ABNORMAL HIGH (ref 65–99)

## 2017-03-13 SURGERY — ESOPHAGOGASTRODUODENOSCOPY (EGD) WITH PROPOFOL
Anesthesia: Monitor Anesthesia Care | Laterality: Left

## 2017-03-13 MED ORDER — DEXAMETHASONE SODIUM PHOSPHATE 4 MG/ML IJ SOLN: 8.0000 mg | Freq: Once | INTRAMUSCULAR | Status: DC | PRN

## 2017-03-13 MED ORDER — PANTOPRAZOLE SODIUM 40 MG PO TBEC
40.0000 mg | DELAYED_RELEASE_TABLET | Freq: Every day | ORAL | Status: DC
  Administered 2017-03-13: 40 mg via ORAL
  Filled 2017-03-13: qty 1

## 2017-03-13 MED ORDER — LACTATED RINGERS IV SOLN
INTRAVENOUS | Status: DC
  Administered 2017-03-13: 09:00:00 via INTRAVENOUS

## 2017-03-13 MED ORDER — ONDANSETRON HCL 4 MG/2ML IJ SOLN
INTRAMUSCULAR | Status: DC | PRN
  Administered 2017-03-13: 4 mg via INTRAVENOUS

## 2017-03-13 MED ORDER — PROPOFOL 10 MG/ML IV BOLUS
INTRAVENOUS | Status: DC | PRN
Start: 2017-03-13 — End: 2017-03-13
  Administered 2017-03-13: 50 mg via INTRAVENOUS
  Administered 2017-03-13: 70 mg via INTRAVENOUS
  Administered 2017-03-13: 50 mg via INTRAVENOUS

## 2017-03-13 MED ORDER — ALBUTEROL SULFATE (2.5 MG/3ML) 0.083% IN NEBU: 2.5000 mg | INHALATION_SOLUTION | RESPIRATORY_TRACT | Status: DC | PRN

## 2017-03-13 MED ORDER — SODIUM CHLORIDE 0.9 % IV SOLN: INTRAVENOUS | Status: DC

## 2017-03-13 MED ORDER — LACTATED RINGERS IV SOLN
INTRAVENOUS | Status: DC | PRN
  Administered 2017-03-13: 09:00:00 via INTRAVENOUS

## 2017-03-13 MED ORDER — APIXABAN 5 MG PO TABS: 5.0000 mg | ORAL_TABLET | Freq: Two times a day (BID) | ORAL | Status: DC

## 2017-03-13 NOTE — H&P (Signed)
Patricia [name] is an 81 y.o. female.   Chief Complaint: Coffee-ground emesis, melena HPI: 81 year old female with symptomatic anemia, coffee-ground emesis and black stools.  Past Medical History:  Diagnosis Date  . Allergy   . Anemia, unspecified   . Arthritis   . Asthma   . Atrial fibrillation status post cardioversion [facility]) 09/2015   s/p TEE/DCCV>>SR on amio  . Benign neoplasm of colon   . Carpal tunnel syndrome   . Cellulitis and abscess of finger, unspecified   . Cerumen impaction   . Cervicalgia   . CHF (congestive heart failure) ([place])   . Chronic airway obstruction, not elsewhere classified   . Chronic anticoagulation 09/2015   Xarelto for afib, CHADS2VASC=5  . Diarrhea   . Diffuse cystic mastopathy   . Dysrhythmia   . Edema   . Encounter for long-term (current) use of other medications   . GERD (gastroesophageal reflux disease)   . Heart murmur   . Lumbago   . Neck pain 05/23/2015  . Obesity, unspecified   . Other and unspecified hyperlipidemia   . Other psoriasis   . Pain in joint, lower leg   . PONV (postoperative nausea and vomiting)   . Primary localized osteoarthrosis of right shoulder 12/18/2015  . Routine gynecological examination   . 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 Scoliosis (and kyphoscoliosis), idiopathic   . Severe aortic stenosis   . Shortness of breath dyspnea    with exertion  . Type II or unspecified type diabetes mellitus without mention of complication, uncontrolled   . Unspecified essential hypertension   . Unspecified hemorrhoids without mention of complication   . Unspecified hypothyroidism   . Urge incontinence     Past Surgical History:  Procedure Laterality Date  . ABDOMINAL HYSTERECTOMY  1987   complete  . BREAST SURGERY     right breast 3 surgeries [phone]  . CARDIAC CATHETERIZATION N/A  09/02/2016   Procedure: Right/Left Heart Cath and Coronary Angiography;  Surgeon: [name] M Martinique, MD;  Location: [facility];  Service: Cardiovascular;  Laterality: N/A;  . CARDIOVERSION N/A 10/19/2015   Procedure: CARDIOVERSION;  Surgeon: Lelon Perla, MD;  Location: [facility];  Service: Cardiovascular;  Laterality: N/A;  . [place]  . COLON SURGERY     2004,2007,2013.  3 times colon surgieres  . EXCISE LE MANDIBULAR LYMPH NODE T     DR C. NEWMAN  . FOOT SURGERY  1989  . LEFT SHOULDER ARTHROSCOPY    . 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 NEUROPLASTY / TRANSPOSITION MEDIAN NERVE AT CARPAL TUNNEL BILATERAL  05/2003  . TEE WITHOUT CARDIOVERSION N/A 10/19/2015   Procedure: Transesophageal Echocardiogram (TEE) ;  Surgeon: Lelon Perla, MD;  Location: [facility];  Service: Cardiovascular;  Laterality: N/A;  . TEE WITHOUT CARDIOVERSION N/A 10/14/2016   Procedure: TRANSESOPHAGEAL ECHOCARDIOGRAM (TEE);  Surgeon: Sherren Mocha, MD;  Location: [facility];  Service: Open Heart Surgery;  Laterality: N/A;  . TOTAL SHOULDER ARTHROPLASTY Right 12/18/2015   Procedure: TOTAL SHOULDER ARTHROPLASTY;  Surgeon: [name], MD;  Location: [facility];  Service: Orthopedics;  Laterality: Right;  . TRANSCATHETER AORTIC VALVE REPLACEMENT, TRANSFEMORAL N/A 10/14/2016   Procedure: TRANSCATHETER AORTIC VALVE REPLACEMENT, TRANSFEMORAL;  Surgeon: Sherren Mocha, MD;  Location: [facility];  Service: Open Heart Surgery;  Laterality: N/A;  . TRIGGER FINGER RELEASE Left   .  VAGINAL CYST REMOVED  1967    Family History  Problem Relation Age of Onset  . Diabetes Father   . Stroke Father   . Heart disease Father   . Liver cancer Sister   . Heart disease Brother   . Asthma Sister   . Arthritis Sister    Social History:  reports that she quit smoking about 27 years ago. She has a 40.00 pack-year smoking history. She has never used smokeless tobacco. She reports that she does not drink alcohol  or use drugs.  Allergies:  Allergies  Allergen Reactions  . Codeine Other (See Comments)    "Spaces out" the patient and she cannot walk well  . Vesicare [Solifenacin] Itching    Medications Prior to Admission  Medication Sig Dispense Refill  . acetaminophen (TYLENOL) 500 MG tablet Take 500 mg by mouth every 6 (six) hours as needed for mild pain.    Marland Kitchen ADVAIR DISKUS 100-50 MCG/DOSE AEPB Inhale 1 inhalation two  times daily for breathing 180 each 3  . albuterol (PROVENTIL HFA;VENTOLIN HFA) 108 (90 Base) MCG/ACT inhaler Inhale 2 puffs into the lungs every 6 (six) hours as needed for wheezing or shortness of breath. 1 Inhaler 11  . albuterol (PROVENTIL) (2.5 MG/3ML) 0.083% nebulizer solution Take 3 mLs (2.5 mg total) by nebulization every 4 (four) hours. And as needed (Patient taking differently: Take 2.5 mg by nebulization every 4 (four) hours as needed for wheezing or shortness of breath. ) 240 mL 3  . amiodarone (PACERONE) 200 MG tablet Take one tablet daily by mouth (Patient taking differently: Take 200 mg by mouth daily. ) 90 tablet 3  . apixaban (ELIQUIS) 5 MG TABS tablet Take 1 tablet (5 mg total) by mouth 2 (two) times daily. 60 tablet 3  . cetirizine (ZYRTEC) 10 MG tablet Take 10 mg by mouth daily as needed for allergies.     . cholecalciferol (VITAMIN D) 1000 units tablet Take 1,000 Units by mouth daily.    . COMFORT LANCETS MISC Use daily to get blood for glucometer 100 each 3  . doxazosin (CARDURA) 2 MG tablet Take 1 tablet (2 mg total) by mouth daily. 90 tablet 3  . ferrous sulfate 325 (65 FE) MG EC tablet Take 325 mg by mouth 2 (two) times daily.    . furosemide (LASIX) 40 MG tablet Take 1 tablet (40 mg total) by mouth daily. 90 tablet 1  . glimepiride (AMARYL) 2 MG tablet TAKE 1 TABLET BY MOUTH AT  BREAKFAST TO CONTROL  GLUCOSE 90 tablet 3  . glucose blood (BAYER CONTOUR TEST) test strip Use daily too check glucose 100 each 4  . Ketotifen Fumarate (ALLERGY EYE DROPS OP) Place 1  drop into both eyes 3 (three) times daily as needed (allergies).     Marland Kitchen levothyroxine (SYNTHROID, LEVOTHROID) 50 MCG tablet Take 1 tablet by mouth  daily for thyroid  supplement 90 tablet 1  . lisinopril (PRINIVIL,ZESTRIL) 10 MG tablet Take 1 tablet (10 mg total) by mouth daily. 30 tablet 3  . metFORMIN (GLUCOPHAGE) 500 MG tablet Take 1 tablet (500 mg total) by mouth 2 (two) times daily with a meal. 180 tablet 3  . mirabegron ER (MYRBETRIQ) 25 MG TB24 tablet One daily to help bladder control (Patient taking differently: Take 25 mg by mouth daily. ) 90 tablet 5  . pantoprazole (PROTONIX) 40 MG tablet Take 1 tablet (40 mg total) by mouth 2 (two) times daily. 30 tablet 3  . potassium gluconate  595 (99 K) MG TABS tablet Take 595 mg by mouth daily. TAKES ONLY WHEN USING FUROSEMIDE    . pravastatin (PRAVACHOL) 20 MG tablet TAKE 1 TABLET BY MOUTH  DAILY 90 tablet 1  . triamcinolone (KENALOG) 0.025 % cream Apply 1 application topically daily as needed (psoriasis).     . chlorhexidine (PERIDEX) 0.12 % solution Rinse with 15 mls twice daily for 30 seconds. Use after breakfast and at bedtime. Spit out excess. Do not swallow. (Patient not taking: Reported on 03/11/2017) 480 mL prn  . fluticasone furoate-vilanterol (BREO ELLIPTA) 100-25 MCG/INH AEPB Inhale into the lungs once daily 1 each 4    Results for orders placed or performed during the hospital encounter of 03/11/17 (from the past 48 hour(s))  Comprehensive metabolic panel     Status: Abnormal   Collection Time: 03/11/17  2:48 PM  Result Value Ref Range   Sodium 136 135 - 145 mmol/L   Potassium 4.1 3.5 - 5.1 mmol/L   Chloride 101 101 - 111 mmol/L   CO2 23 22 - 32 mmol/L   Glucose, Bld 217 (H) 65 - 99 mg/dL   BUN 32 (H) 6 - 20 mg/dL   Creatinine, Ser 2.22 (H) 0.44 - 1.00 mg/dL   Calcium 8.6 (L) 8.9 - 10.3 mg/dL   Total Protein 6.6 6.5 - 8.1 g/dL   Albumin 3.5 3.5 - 5.0 g/dL   AST 30 15 - 41 U/L   ALT 34 14 - 54 U/L   Alkaline Phosphatase 52 38 -  126 U/L   Total Bilirubin 0.4 0.3 - 1.2 mg/dL   GFR calc non Af Amer 20 (L) >60 mL/min   GFR calc Af Amer 23 (L) >60 mL/min    Comment: (NOTE) The eGFR has been calculated using the CKD EPI equation. This calculation has not been validated in all clinical situations. eGFR's persistently <60 mL/min signify possible Chronic Kidney Disease.    Anion gap 12 5 - 15  CBC     Status: Abnormal   Collection Time: 03/11/17  2:48 PM  Result Value Ref Range   WBC 5.5 4.0 - 10.5 K/uL   RBC 2.70 (L) 3.87 - 5.11 MIL/uL   Hemoglobin 7.2 (L) 12.0 - 15.0 g/dL   HCT 24.0 (L) 36.0 - 46.0 %   MCV 88.9 78.0 - 100.0 fL   MCH 26.7 26.0 - 34.0 pg   MCHC 30.0 30.0 - 36.0 g/dL   RDW 18.4 (H) 11.5 - 15.5 %   Platelets 238 150 - 400 K/uL  Type and screen [place]     Status: None   Collection Time: 03/11/17  2:55 PM  Result Value Ref Range   ABO/RH(D) A POS    Antibody Screen NEG    Sample Expiration 03/14/2017    Unit Number [id number]    Blood Component Type RED CELLS,LR    Unit division 00    Status of Unit ISSUED,FINAL    Transfusion Status OK TO TRANSFUSE    Crossmatch Result Compatible   POC occult blood, ED     Status: Abnormal   Collection Time: 03/11/17  7:50 PM  Result Value Ref Range   Fecal Occult Bld POSITIVE (A) NEGATIVE  APTT     Status: None   Collection Time: 03/11/17  7:53 PM  Result Value Ref Range   aPTT 32 24 - 36 seconds  Protime-INR     Status: Abnormal   Collection Time: 03/11/17  7:53 PM  Result  Value Ref Range   Prothrombin Time 16.2 (H) 11.4 - 15.2 seconds   INR 1.29   I-Stat CG4 Lactic Acid, ED     Status: None   Collection Time: 03/11/17  8:06 PM  Result Value Ref Range   Lactic Acid, Venous 1.38 0.5 - 1.9 mmol/L  CBG monitoring, ED     Status: None   Collection Time: 03/11/17  8:47 PM  Result Value Ref Range   Glucose-Capillary 67 65 - 99 mg/dL  Prepare RBC     Status: None   Collection Time: 03/11/17  9:17 PM  Result Value Ref Range    Order Confirmation ORDER PROCESSED BY BLOOD BANK   Glucose, capillary     Status: None   Collection Time: 03/12/17 12:02 AM  Result Value Ref Range   Glucose-Capillary 94 65 - 99 mg/dL  Basic metabolic panel     Status: Abnormal   Collection Time: 03/12/17  1:43 AM  Result Value Ref Range   Sodium 137 135 - 145 mmol/L   Potassium 4.1 3.5 - 5.1 mmol/L   Chloride 106 101 - 111 mmol/L   CO2 23 22 - 32 mmol/L   Glucose, Bld 90 65 - 99 mg/dL   BUN 33 (H) 6 - 20 mg/dL   Creatinine, Ser 1.94 (H) 0.44 - 1.00 mg/dL   Calcium 8.2 (L) 8.9 - 10.3 mg/dL   GFR calc non Af Amer 24 (L) >60 mL/min   GFR calc Af Amer 27 (L) >60 mL/min    Comment: (NOTE) The eGFR has been calculated using the CKD EPI equation. This calculation has not been validated in all clinical situations. eGFR's persistently <60 mL/min signify possible Chronic Kidney Disease.    Anion gap 8 5 - 15  CBC     Status: Abnormal   Collection Time: 03/12/17  1:43 AM  Result Value Ref Range   WBC 6.0 4.0 - 10.5 K/uL   RBC 2.78 (L) 3.87 - 5.11 MIL/uL   Hemoglobin 8.0 (L) 12.0 - 15.0 g/dL   HCT 24.3 (L) 36.0 - 46.0 %   MCV 87.4 78.0 - 100.0 fL   MCH 28.8 26.0 - 34.0 pg   MCHC 32.9 30.0 - 36.0 g/dL   RDW 17.6 (H) 11.5 - 15.5 %   Platelets 183 150 - 400 K/uL  Glucose, capillary     Status: None   Collection Time: 03/12/17  4:40 AM  Result Value Ref Range   Glucose-Capillary 78 65 - 99 mg/dL  CBC     Status: Abnormal   Collection Time: 03/12/17  7:49 AM  Result Value Ref Range   WBC 6.7 4.0 - 10.5 K/uL   RBC 3.28 (L) 3.87 - 5.11 MIL/uL   Hemoglobin 9.1 (L) 12.0 - 15.0 g/dL   HCT 28.8 (L) 36.0 - 46.0 %   MCV 87.8 78.0 - 100.0 fL   MCH 27.7 26.0 - 34.0 pg   MCHC 31.6 30.0 - 36.0 g/dL   RDW 17.7 (H) 11.5 - 15.5 %   Platelets 216 150 - 400 K/uL  Glucose, capillary     Status: None   Collection Time: 03/12/17  8:04 AM  Result Value Ref Range   Glucose-Capillary 88 65 - 99 mg/dL  Glucose, capillary     Status: Abnormal    Collection Time: 03/12/17 12:00 PM  Result Value Ref Range   Glucose-Capillary 169 (H) 65 - 99 mg/dL  Glucose, capillary     Status: None   Collection Time:  03/12/17  5:14 PM  Result Value Ref Range   Glucose-Capillary 95 65 - 99 mg/dL  Glucose, capillary     Status: Abnormal   Collection Time: 03/12/17  8:58 PM  Result Value Ref Range   Glucose-Capillary 156 (H) 65 - 99 mg/dL  CBC     Status: Abnormal   Collection Time: 03/13/17  6:28 AM  Result Value Ref Range   WBC 5.3 4.0 - 10.5 K/uL   RBC 2.74 (L) 3.87 - 5.11 MIL/uL   Hemoglobin 7.7 (L) 12.0 - 15.0 g/dL   HCT 23.9 (L) 36.0 - 46.0 %   MCV 87.2 78.0 - 100.0 fL   MCH 28.1 26.0 - 34.0 pg   MCHC 32.2 30.0 - 36.0 g/dL   RDW 18.3 (H) 11.5 - 15.5 %   Platelets 194 150 - 400 K/uL  Basic metabolic panel     Status: Abnormal   Collection Time: 03/13/17  6:28 AM  Result Value Ref Range   Sodium 140 135 - 145 mmol/L   Potassium 4.7 3.5 - 5.1 mmol/L   Chloride 110 101 - 111 mmol/L   CO2 22 22 - 32 mmol/L   Glucose, Bld 112 (H) 65 - 99 mg/dL   BUN 19 6 - 20 mg/dL   Creatinine, Ser 1.45 (H) 0.44 - 1.00 mg/dL   Calcium 8.5 (L) 8.9 - 10.3 mg/dL   GFR calc non Af Amer 34 (L) >60 mL/min   GFR calc Af Amer 39 (L) >60 mL/min    Comment: (NOTE) The eGFR has been calculated using the CKD EPI equation. This calculation has not been validated in all clinical situations. eGFR's persistently <60 mL/min signify possible Chronic Kidney Disease.    Anion gap 8 5 - 15  Glucose, capillary     Status: Abnormal   Collection Time: 03/13/17  7:57 AM  Result Value Ref Range   Glucose-Capillary 123 (H) 65 - 99 mg/dL   No results found.  ROS No changes from prior consult note from yesterday.  Blood pressure (!) 137/109, pulse 71, temperature 98.3 F (36.8 C), temperature source Oral, resp. rate 20, height _0  (1.6 m), weight 93.9 kg (207 lb), SpO2 98 %. Physical Exam  Hemodynamically stable No change in physical exam from  yesterday  Assessment/Plan Upper GI bleed Diagnostic EGD now  Ronnette Juniper, MD 03/13/2017, 8:54 AM

## 2017-03-13 NOTE — Progress Notes (Signed)
Patient discharge teaching given, including activity, diet, follow-up appoints, and medications. Patient verbalized understanding of all discharge instructions. IV access was d/c'd. Vitals are stable. Skin is intact except as charted in most recent assessments. Pt to be escorted out by NT, to be driven home by family.

## 2017-03-13 NOTE — Anesthesia Preprocedure Evaluation (Addendum)
Anesthesia Evaluation  Patient identified by MRN, date of birth, ID band Patient awake    Reviewed: Allergy & Precautions, H&P , Patient's Chart, lab work & pertinent test results, Unable to perform ROS - Chart review only  History of Anesthesia Complications (+) PONV and history of anesthetic complications  Airway Mallampati: II  TM Distance: >3 FB Neck ROM: full    Dental  (+) Teeth Intact, Dental Advidsory Given   Pulmonary asthma , COPD, former smoker,    Pulmonary exam normal breath sounds clear to auscultation       Cardiovascular hypertension, + CAD and +CHF  Normal cardiovascular exam+ Cardiac Defibrillator  - Systolic murmurs 1. Single vessel obstructive CAD involving OM3 2. Moderate to severe aortic stenosis. Mean gradient 41 mm Hg, valve index 0.7 3. Mild pulmonary HTN 4. Elevated LV filling pressures. Prominent V waves on PCWP tracings c/w decreased LV compliance.  5. Normal cardiac output.   Plan: recommend consideration for AVR/ single vessel CABG.     Neuro/Psych negative neurological ROS     GI/Hepatic Neg liver ROS, GERD  ,  Endo/Other  diabetesHypothyroidism Morbid obesity  Renal/GU Renal InsufficiencyRenal disease     Musculoskeletal   Abdominal (+) + obese,   Peds  Hematology  (+) anemia ,   Anesthesia Other Findings Study Result   Result status: Final result                          [facility]                        [address],  [address]                            [phone]  ------------------------------------------------------------------- Transthoracic Echocardiography  Patient:    Patricia [name], Patricia [name] MR #:       [id number] Study Date: 11/24/2016 Gender:     F Age:        81 Height:     158.8 cm Weight:     96.8 kg BSA:        2.12 m^2 Pt. Status: Room:   ATTENDING    Kirk Ruths  REFERRING    Estill Dooms  SONOGRAPHER  [place], [place]  ORDERING     Sherren Mocha, MD  REFERRING    Sherren Mocha, MD  PERFORMING   [facility], Outpatient  cc:  ------------------------------------------------------------------- LV EF: 65% -   70%    Reproductive/Obstetrics                         Anesthesia Physical  Anesthesia Plan  ASA: IV  Anesthesia Plan: MAC   Post-op Pain Management:    Induction: Intravenous  Airway Management Planned: Natural Airway and Simple Face Mask  Additional Equipment:   Intra-op Plan:   Post-operative Plan:   Informed Consent: I have reviewed the patients History and Physical, chart, labs and discussed the procedure including the risks, benefits and alternatives for the proposed anesthesia with the patient or authorized representative who has indicated his/her understanding and acceptance.   Dental Advisory Given  Plan Discussed with: CRNA and Surgeon  Anesthesia Plan Comments: (  )     Anesthesia  Quick Evaluation

## 2017-03-13 NOTE — Interval H&P Note (Signed)
History and Physical Interval Note: 81 year old female with melena and coffee-ground emesis scheduled for an EGD today, hemodynamically stable.  03/13/2017 8:51 AM  [name]  has presented today for EGD, with the diagnosis of hemetemesis  The various methods of treatment have been discussed with the patient and family. After consideration of risks, benefits and other options for treatment, the patient has consented to  Procedure(s): ESOPHAGOGASTRODUODENOSCOPY (EGD) WITH PROPOFOL (Left) as a surgical intervention .  The patient's history has been reviewed, patient examined, no change in status, stable for surgery.  I have reviewed the patient's chart and labs.  Questions were answered to the patient's satisfaction.     Ronnette Juniper

## 2017-03-13 NOTE — Op Note (Signed)
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 Minimal oozing was noted from one of the AVMs. One Endo Clip was placed and APC was performed to destroy both AVMs. No bleeding noted at the end of the procedure. Multiple gastric body polyps noted, most likely related to fundic gland polyposis, biopsies were not taken. Okay to resume regular diet. Continue resume Eliquis in a.m.  Ronnette Juniper, MD [phone]

## 2017-03-13 NOTE — Anesthesia Procedure Notes (Signed)
Procedure Name: MAC Date/Time: 03/13/2017 8:59 AM Performed by: Neldon Newport Pre-anesthesia Checklist: Timeout performed, Patient being monitored, Suction available, Patient identified and Emergency Drugs available Oxygen Delivery Method: Nasal cannula

## 2017-03-13 NOTE — Discharge Summary (Signed)
Physician Discharge Summary  Patricia [name] HFW:[id number] DOB: Apr 07, 1936  PCP: Estill Dooms, MD  Admit date: 03/11/2017 Discharge date: 03/13/2017  Recommendations for Outpatient Follow-up:  1. Dr. Jeanmarie Hubert, PCP on 03/16/17, to be seen with repeat labs (CBC & BMP). 2. Dr. Ronnette Juniper, [facility] in 1 week to arrange for outpatient capsule endoscopy. 3. Dr. [name] Martinique, Cardiology  Home Health: None Equipment/Devices: None    Discharge Condition: Improved and stable  CODE STATUS: Full  Diet recommendation: Heart healthy and diabetic diet.  Discharge Diagnoses:  Principal Problem:   UGIB (upper gastrointestinal bleed) Active Problems:   Essential hypertension   DM type 2 (diabetes mellitus, type 2) (HCC)   Atrial fibrillation (HCC)   Acute blood loss anemia   S/P TAVR (transcatheter aortic valve replacement)   AKI (acute kidney injury) ([place])   Brief Summary: 81 year old female with PMH of PAF, s/p TEE/DCCV> on Amiodarone & Eliquis, TAVR (Dr. Martinique), GERD, HLD, DM 2, HTN, hypothyroid who presented with 2 episodes of coffee-ground emesis a week prior to admission then on 03/10/15, dizzy and lightheaded without syncope. She has chronically black stools while on PO Iron. Did not report blood in emesis or stools. She was supposed to see her GI in [facility] for an EGD today but due to ongoing symptoms, her gastroenterologist recommended hospital admission. [facility] consulted.    Assessment & Plan:   1. Acute upper GI bleed: In the context of Eliquis, last dose taken on morning of 5/16, currently on hold. DD: Gastritis, esophagitis, PUD versus other etiologies. She was treated with bowel rest/full liquid diet, IV Protonix infusion and underwent EGD on 5/18 with findings as below. AVMs were treated. Discussed with GI who advised to resume regular diet, discharge home today and may resume Eliquis from tomorrow, outpatient follow-up in 1 week to arrange capsule endoscopy to evaluate  for more distal AVMs. GI bleeding has clinically stopped. 2. Acute blood loss anemia, complicating chronic anemia: Presented with hemoglobin of 7.2. Status post 1 unit PRBC transfusion overnight of admission. Hemoglobin improved to 8 > 9.1. Baseline hemoglobin 8-9. Etiology of chronic anemia is unknown, some of it could be due to chronic kidney disease. Eventually needs screening colonoscopy too and can be pursued as outpatient. Hemoglobin this morning was 7.7. Patient asymptomatic. Repeat hemoglobin several hours later 7.6. Continue home iron supplements. Follow-up with PCP early next week with repeat CBCs. 3. Acute on stage III chronic kidney disease: Baseline creatinine may be in the 1.4-1.6 range. Presented with creatinine of 2.2. Brief gentle IV fluids and follow BMP. Creatinine improved to 1.45. Given recent acute kidney injury, continue to hold lisinopril at discharge until outpatient follow-up with PCP with labs. Lasix resumed. 4. Paroxysmal A. fib: Continue amiodarone. Eliquis held and to be resumed 5/19. 5. GERD: Continue Protonix as per prior home dose. 6. Essential hypertension: Mildly uncontrolled. Lisinopril on hold as stated above. Continue Doxazosin 2 MG daily. 7. Type II DM:  treated with SSI in the hospital. Resume glimepiride at discharge but hold metformin secondary to stage III chronic kidney disease and may need to be indefinitely discontinued. Outpatient follow-up with PCP. 8. Hypothyroid: Continue Synthroid. Stable. 9. Chronic diastolic CHF: 2-D echo 0/27/74: LVEF 65-70 percent and grade 2 diastolic dysfunction. Clinically compensated. Lasix temporarily on hold and resumed at discharge. 10. 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 Minimal  oozing was noted from one of the AVMs. One Endo Clip was placed and APC was performed to destroy both AVMs. No bleeding noted at the end of the  procedure. Multiple gastric body polyps noted, most likely related to fundic gland polyposis, biopsies were not taken. Okay to resume regular diet. Continue resume Eliquis in a.m.      Discharge Instructions  Discharge Instructions    (HEART FAILURE PATIENTS) Call MD:  Anytime you have any of the following symptoms: 1) 3 pound weight gain in 24 hours or 5 pounds in 1 week 2) shortness of breath, with or without a dry hacking cough 3) swelling in the hands, feet or stomach 4) if you have to sleep on extra pillows at night in order to breathe.    Complete by:  As directed    Call MD for:    Complete by:  As directed    Blood in stools. Vomiting coffee ground material or blood.   Call MD for:  difficulty breathing, headache or visual disturbances    Complete by:  As directed    Call MD for:  extreme fatigue    Complete by:  As directed    Call MD for:  persistant dizziness or light-headedness    Complete by:  As directed    Call MD for:  persistant nausea and vomiting    Complete by:  As directed    Call MD for:  severe uncontrolled pain    Complete by:  As directed    Diet - low sodium heart healthy    Complete by:  As directed    Diet Carb Modified    Complete by:  As directed    Increase activity slowly    Complete by:  As directed        Medication List    STOP taking these medications   chlorhexidine 0.12 % solution Commonly known as:  PERIDEX   lisinopril 10 MG tablet Commonly known as:  PRINIVIL,ZESTRIL   metFORMIN 500 MG tablet Commonly known as:  GLUCOPHAGE     TAKE these medications   acetaminophen 500 MG tablet Commonly known as:  TYLENOL Take 500 mg by mouth every 6 (six) hours as needed for mild pain.   ADVAIR DISKUS 100-50 MCG/DOSE Aepb Generic drug:  Fluticasone-Salmeterol Inhale 1 inhalation two  times daily for breathing   albuterol 108 (90 Base) MCG/ACT inhaler Commonly known as:  PROVENTIL HFA;VENTOLIN HFA Inhale 2 puffs into the lungs every  6 (six) hours as needed for wheezing or shortness of breath.   albuterol (2.5 MG/3ML) 0.083% nebulizer solution Commonly known as:  PROVENTIL Take 3 mLs (2.5 mg total) by nebulization every 4 (four) hours as needed for wheezing or shortness of breath.   ALLERGY EYE DROPS OP Place 1 drop into both eyes 3 (three) times daily as needed (allergies).   amiodarone 200 MG tablet Commonly known as:  PACERONE Take one tablet daily by mouth What changed:  how much to take  how to take this  when to take this  additional instructions   apixaban 5 MG Tabs tablet Commonly known as:  ELIQUIS Take 1 tablet (5 mg total) by mouth 2 (two) times daily. Start taking on:  03/14/2017   cetirizine 10 MG tablet Commonly known as:  ZYRTEC Take 10 mg by mouth daily as needed for allergies.   cholecalciferol 1000 units tablet Commonly known as:  VITAMIN D Take 1,000 Units by mouth daily.   COMFORT LANCETS  Misc Use daily to get blood for glucometer   doxazosin 2 MG tablet Commonly known as:  CARDURA Take 1 tablet (2 mg total) by mouth daily.   ferrous sulfate 325 (65 FE) MG EC tablet Take 325 mg by mouth 2 (two) times daily.   fluticasone furoate-vilanterol 100-25 MCG/INH Aepb Commonly known as:  BREO ELLIPTA Inhale into the lungs once daily   furosemide 40 MG tablet Commonly known as:  LASIX Take 1 tablet (40 mg total) by mouth daily.   glimepiride 2 MG tablet Commonly known as:  AMARYL TAKE 1 TABLET BY MOUTH AT  BREAKFAST TO CONTROL  GLUCOSE   glucose blood test strip Commonly known as:  BAYER CONTOUR TEST Use daily too check glucose   levothyroxine 50 MCG tablet Commonly known as:  SYNTHROID, LEVOTHROID Take 1 tablet by mouth  daily for thyroid  supplement   mirabegron ER 25 MG Tb24 tablet Commonly known as:  MYRBETRIQ One daily to help bladder control What changed:  how much to take  how to take this  when to take this  additional instructions   pantoprazole 40 MG  tablet Commonly known as:  PROTONIX Take 1 tablet (40 mg total) by mouth 2 (two) times daily.   potassium gluconate 595 (99 K) MG Tabs tablet Take 595 mg by mouth daily. TAKES ONLY WHEN USING FUROSEMIDE   pravastatin 20 MG tablet Commonly known as:  PRAVACHOL TAKE 1 TABLET BY MOUTH  DAILY   triamcinolone 0.025 % cream Commonly known as:  KENALOG Apply 1 application topically daily as needed (psoriasis).      Follow-up Information    Estill Dooms, MD. Schedule an appointment as soon as possible for a visit on 03/16/2017.   Specialty:  Internal Medicine Why:  To be seen with repeat labs (CBC & BMP). Contact information: [place] [address] [phone]        Ronnette Juniper, MD. Schedule an appointment as soon as possible for a visit in 1 week(s).   Specialty:  Gastroenterology Contact information: [address] [place] [address] [phone]        Martinique, [name] M, MD. Schedule an appointment as soon as possible for a visit.   Specialty:  Cardiology Contact information: [address] [place] [address] [phone]          Allergies  Allergen Reactions  . Codeine Other (See Comments)    "Spaces out" the patient and she cannot walk well  . Vesicare [Solifenacin] Itching      Procedures/Studies: Dg Chest 2 View  Result Date: 02/18/2017 CLINICAL DATA:  Lingering cough, recent diagnosis of bronchitis EXAM: CHEST  2 VIEW COMPARISON:  Chest x-ray of 10/15/2016 FINDINGS: Somewhat blunted right costophrenic angle appears to be due to pleuroparenchymal scarring. No definite active infiltrate or effusion is seen. Mediastinal and hilar contours are unremarkable. The heart is mildly enlarged and stable. Aortic valve replacement is noted. Also there is right humeral head replacement present. No acute bony abnormality is seen. There is mild thoracic aortic atherosclerosis noted. IMPRESSION: 1. No active lung disease.  Apparent chronic change at the right costophrenic angle. 2. Aortic valve replacement.  Mild cardiomegaly. 3. Mild thoracic aortic atherosclerosis. Electronically Signed   By: Ivar Drape M.D.   On: 02/18/2017 10:45      Subjective: Patient seen post EGD. Denies complaints. No abdominal pain. No emesis since admission. Had usual BM yesterday. Denies dizziness, lightheadedness, chest pain  or dyspnea even with activity in the room.  Discharge Exam:  Vitals:   03/13/17 0845 03/13/17 0925 03/13/17 0930 03/13/17 1314  BP: (!) 137/109 (!) 147/46 (!) 147/43 (!) 136/46  Pulse:  74 73 66  Resp:  _0 Temp:  97.9 F (36.6 C)  98.1 F (36.7 C)  TempSrc:  Oral  Oral  SpO2:  97% 94% 93%  Weight:      Height:        General exam: Pleasant elderly female sitting up comfortably at edge of bed this morning. Respiratory system: Clear to auscultation. Respiratory effort normal. Cardiovascular system: S1 & S2 heard, RRR. No JVD, murmurs, rubs, gallops or clicks. No pedal edema. Telemetry: Sinus rhythm with first-degree AV block and BBB morphology. Gastrointestinal system: Abdomen is nondistended, soft and nontender. No organomegaly or masses felt. Normal bowel sounds heard. Central nervous system: Alert and oriented. No focal neurological deficits. Extremities: Symmetric 5 x 5 power. Skin: No rashes, lesions or ulcers Psychiatry: Judgement and insight appear normal. Mood & affect appropriate.    The results of significant diagnostics from this hospitalization (including imaging, microbiology, ancillary and laboratory) are listed below for reference.      Labs: CBC:  Recent Labs Lab 03/11/17 1448 03/12/17 0143 03/12/17 0749 03/13/17 0628 03/13/17 1227  WBC 5.5 6.0 6.7 5.3 5.1  HGB 7.2* 8.0* 9.1* 7.7* 7.6*  HCT 24.0* 24.3* 28.8* 23.9* 24.7*  MCV 88.9 87.4 87.8 87.2 89.2  PLT 238 183 216 194 127   Basic Metabolic Panel:  Recent Labs Lab 03/11/17 1448 03/12/17 0143  03/13/17 0628  NA 136 137 140  K 4.1 4.1 4.7  CL 101 106 110  CO2 _1 GLUCOSE 217* 90 112*  BUN 32* 33* 19  CREATININE 2.22* 1.94* 1.45*  CALCIUM 8.6* 8.2* 8.5*   Liver Function Tests:  Recent Labs Lab 03/11/17 1448  AST 30  ALT 34  ALKPHOS 52  BILITOT 0.4  PROT 6.6  ALBUMIN 3.5   BNP (last 3 results)  Recent Labs  09/11/16 1602 09/13/16 0247  BNP 396.9* 263.7*   Cardiac Enzymes: No results for input(s): CKTOTAL, CKMB, CKMBINDEX, TROPONINI in the last 168 hours. CBG:  Recent Labs Lab 03/12/17 1200 03/12/17 1714 03/12/17 2058 03/13/17 0757 03/13/17 1246  GLUCAP 169* 95 156* 123* 133*       Time coordinating discharge: Over 30 minutes  SIGNED:  Vernell Leep, MD, FACP, FHM. [facility] Pager [phone]  If 7PM-7AM, please contact night-coverage [email or web address] Password [email or web address] 03/13/2017, 1:50 PM

## 2017-03-13 NOTE — Op Note (Signed)
[facility] Patient Name: Patricia [name] Procedure Date : 03/13/2017 MRN: [id number] Attending MD: Ronnette Juniper , MD Date of Birth: 13-May-1936 CSN: [id number] Age: 81 Admit Type: Outpatient Procedure:                Upper GI endoscopy Indications:              Iron deficiency anemia secondary to chronic blood                            loss, Coffee-ground emesis, Melena Providers:                Ronnette Juniper, MD, Cleda Daub, RN, Marcene Duos,                            Technician Referring MD:              Medicines:                Monitored Anesthesia Care Complications:            No immediate complications. Estimated Blood Loss:     Estimated blood loss: none. Procedure:                Pre-Anesthesia Assessment:                           - Prior to the procedure, a History and Physical                            was performed, and patient medications and                            allergies were reviewed. The patient's tolerance of                            previous anesthesia was also reviewed. The risks                            and benefits of the procedure and the sedation                            options and risks were discussed with the patient.                            All questions were answered, and informed consent                            was obtained. Prior Anticoagulants: The patient has                            taken Eliquis (apixaban), last dose was 2 days                            prior to procedure. ASA Grade Assessment: IV - A  patient with severe systemic disease that is a                            constant threat to life. After reviewing the risks                            and benefits, the patient was deemed in                            satisfactory condition to undergo the procedure.                           After obtaining informed consent, the endoscope was                            passed under direct  vision. Throughout the                            procedure, the patient's blood pressure, pulse, and                            oxygen saturations were monitored continuously. The                            Endoscope was introduced through the mouth, and                            advanced to the second part of duodenum. The upper                            GI endoscopy was accomplished without difficulty.                            The patient tolerated the procedure well. Scope In: Scope Out: Findings:      The examined esophagus was normal.      The Z-line was regular and was found 38 cm from the incisors.      Multiple small sessile polyps with no bleeding and no stigmata of recent       bleeding were found in the gastric body.      The cardia and gastric fundus were normal on retroflexion.      Two small angiodysplastic lesions mild,slow oozing were found in the       second portion of the duodenum. To stop active bleeding, one hemostatic       clip was successfully placed. There was no bleeding at the end of the       procedure. Coagulation for destruction of remaining portion of lesion       using argon plasma at 1 liter/minute and 20 watts was successful. Impression:               - Normal esophagus.                           - Z-line regular, 38 cm from the incisors.                           -  Multiple gastric polyps.                           - Two mild,slow oozing angiodysplastic lesions in                            the duodenum. Clip was placed. Treated with argon                            plasma coagulation (APC).                           - No specimens collected. Moderate Sedation:      Patient did not receive moderate sedation for this procedure, but       instead received monitored anesthesia care. Recommendation:           - Resume regular diet.                           - Continue present medications.                           - Resume Eliquis (apixaban) at  prior dose tomorrow.                            Refer to referring physician for further adjustment                            of therapy.                           - Return patient to hospital ward for ongoing care. Procedure Code(s):        --- Professional ---                           [phone], Esophagogastroduodenoscopy, flexible,                            transoral; with ablation of tumor(s), polyp(s), or                            other lesion(s) (includes pre- and post-dilation                            and guide wire passage, when performed)                           43255, 59, Esophagogastroduodenoscopy, flexible,                            transoral; with control of bleeding, any method Diagnosis Code(s):        --- Professional ---                           K31.7, Polyp of stomach and duodenum  D50.0, Iron deficiency anemia secondary to blood                            loss (chronic)                           K92.0, Hematemesis                           K92.1, Melena (includes Hematochezia) CPT copyright 2016 American Medical Association. All rights reserved. The codes documented in this report are preliminary and upon coder review may  be revised to meet current compliance requirements. Ronnette Juniper, MD 03/13/2017 9:26:42 AM This report has been signed electronically. Number of Addenda: 0

## 2017-03-13 NOTE — Brief Op Note (Signed)
03/11/2017 - 03/13/2017  9:27 AM  PATIENT:  Patricia [name]  81 y.o. female  PRE-OPERATIVE DIAGNOSIS:  hemetemesis  POST-OPERATIVE DIAGNOSIS:  2nd portion duodenum apc ,endo hemostasis clip placement  PROCEDURE:  Procedure(s): ESOPHAGOGASTRODUODENOSCOPY (EGD) WITH PROPOFOL (Left)  SURGEON:  Surgeon(s) and Role:    Ronnette Juniper, MD - Primary  PHYSICIAN ASSISTANT:   ASSISTANTS: none   ANESTHESIA:   MAC  EBL:  Total I/O In: 200 [I.V.:200] Out: 128 [Urine:125; Blood:3]  BLOOD ADMINISTERED:none  DRAINS: none   LOCAL MEDICATIONS USED:  NONE  SPECIMEN:  No Specimen  DISPOSITION OF SPECIMEN:  N/A  COUNTS:  YES  TOURNIQUET:  * No tourniquets in log *  DICTATION: .Dragon Dictation  PLAN OF CARE: Admit to inpatient   PATIENT DISPOSITION:  PACU - hemodynamically stable.   Delay start of Pharmacological VTE agent (>24hrs) due to surgical blood loss or risk of bleeding: yes

## 2017-03-13 NOTE — Transfer of Care (Signed)
Immediate Anesthesia Transfer of Care Note  Patient: Patricia [name]  Procedure(s) Performed: Procedure(s): ESOPHAGOGASTRODUODENOSCOPY (EGD) WITH PROPOFOL (Left)  Patient Location: PACU  Anesthesia Type:MAC  Level of Consciousness: awake, alert  and oriented  Airway & Oxygen Therapy: Patient Spontanous Breathing and Patient connected to nasal cannula oxygen  Post-op Assessment: Report given to RN, Post -op Vital signs reviewed and stable and Patient able to stick tongue midline  Post vital signs: Reviewed and stable  Last Vitals:  Vitals:   03/13/17 0841 03/13/17 0845  BP:  (!) 137/109  Pulse: 71   Resp: 20   Temp: 36.8 C     Last Pain:  Vitals:   03/13/17 0841  TempSrc: Oral         Complications: No apparent anesthesia complications

## 2017-03-13 NOTE — Anesthesia Postprocedure Evaluation (Addendum)
Anesthesia Post Note  Patient: Patricia [name]  Procedure(s) Performed: Procedure(s) (LRB): ESOPHAGOGASTRODUODENOSCOPY (EGD) WITH PROPOFOL (Left)  Patient location during evaluation: Endoscopy Anesthesia Type: MAC Level of consciousness: awake Pain management: pain level controlled Respiratory status: spontaneous breathing Cardiovascular status: stable Postop Assessment: no signs of nausea or vomiting Anesthetic complications: no        Last Vitals:  Vitals:   03/13/17 0925 03/13/17 0930  BP: (!) 147/46 (!) 147/43  Pulse: 74 73  Resp: 20 20  Temp: 36.6 C     Last Pain:  Vitals:   03/13/17 0925  TempSrc: Oral   Pain Goal:                 Marnae Madani JR,JOHN Lavenia Stumpo

## 2017-03-13 NOTE — H&P (View-Only) (Signed)
[facility] Consult  Referring Provider: Rockney Ghee Primary Care Physician:  Estill Dooms, MD Primary Gastroenterologist: Dr.Shearin([place], [place])  Reason for Consultation: Coffee ground emesis, anemia  HPI: Patricia [name] is a 81 y.o. Caucasian female is admitted on 03/11/2017 with complaints of coffee-ground emesis. Patient states that she was in the usual state of health until last week when she developed dizziness and nausea and had several episodes of vomiting. This occurred once last week, and again once 4 days ago.She noticed that the color of the vomitus waas dark brown. She has noticed that her stools have always been black as she has been on iron for the last several months. She denies bloody bowel movements. Because of ongoing dizziness and nausea she was evaluated by an ENT physician who did not deem that the symptoms were because of inner ear dysfunction. She was supposed to get an EGD as an outpatient today, however, because she had ongoing dizziness and episodes of coffee-ground emesis her gastroenterologist suggested that she be admitted for further investigation. Patient reports having an EGD and colonoscopy performed in March 2017. She takes Protonix on a regular basis twice a day. In the past she has had post polypectomy bleeding requiring admission as well as multiple blood transfusions. Recently she denies loss of appetite or unintentional weight loss. She denies difficulty swallowing or pain on swallowing. She also denies early satiety, bloating but complains of mild acid reflux and heartburn intermittently. Her last dose of Eliquis was yesterday morning.   Past Medical History:  Diagnosis Date  . Allergy   . Anemia, unspecified   . Arthritis   . Asthma   . Atrial fibrillation status post cardioversion [facility]) 09/2015   s/p TEE/DCCV>>SR on amio  . Benign neoplasm of colon   . Carpal tunnel syndrome   . Cellulitis and abscess of finger,  unspecified   . Cerumen impaction   . Cervicalgia   . CHF (congestive heart failure) ([facility])   . Chronic airway obstruction, not elsewhere classified   . Chronic anticoagulation 09/2015   Xarelto for afib, CHADS2VASC=5  . Diarrhea   . Diffuse cystic mastopathy   . Dysrhythmia   . Edema   . Encounter for long-term (current) use of other medications   . GERD (gastroesophageal reflux disease)   . Heart murmur   . Lumbago   . Neck pain 05/23/2015  . Obesity, unspecified   . Other and unspecified hyperlipidemia   . Other psoriasis   . Pain in joint, lower leg   . PONV (postoperative nausea and vomiting)   . Primary localized osteoarthrosis of right shoulder 12/18/2015  . Routine gynecological examination   . 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 Scoliosis (and kyphoscoliosis), idiopathic   . Severe aortic stenosis   . Shortness of breath dyspnea    with exertion  . Type II or unspecified type diabetes mellitus without mention of complication, uncontrolled   . Unspecified essential hypertension   . Unspecified hemorrhoids without mention of complication   . Unspecified hypothyroidism   . Urge incontinence     Past Surgical History:  Procedure Laterality Date  . ABDOMINAL HYSTERECTOMY  1987   complete  . BREAST SURGERY     right breast 3 surgeries [phone]  . CARDIAC CATHETERIZATION N/A 09/02/2016   Procedure: Right/Left Heart Cath and Coronary Angiography;  Surgeon: [name] M Martinique, MD;  Location: [facility];  Service: Cardiovascular;  Laterality: N/A;  . CARDIOVERSION N/A 10/19/2015   Procedure: CARDIOVERSION;  Surgeon: Lelon Perla, MD;  Location: [facility];  Service: Cardiovascular;  Laterality: N/A;  . [place]  . COLON SURGERY     2004,2007,2013.  3 times colon surgieres  . EXCISE LE MANDIBULAR LYMPH  NODE T     DR C. NEWMAN  . FOOT SURGERY  1989  . LEFT SHOULDER ARTHROSCOPY    . 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 NEUROPLASTY / TRANSPOSITION MEDIAN NERVE AT CARPAL TUNNEL BILATERAL  05/2003  . TEE WITHOUT CARDIOVERSION N/A 10/19/2015   Procedure: Transesophageal Echocardiogram (TEE) ;  Surgeon: Lelon Perla, MD;  Location: [facility];  Service: Cardiovascular;  Laterality: N/A;  . TEE WITHOUT CARDIOVERSION N/A 10/14/2016   Procedure: TRANSESOPHAGEAL ECHOCARDIOGRAM (TEE);  Surgeon: Sherren Mocha, MD;  Location: [facility];  Service: Open Heart Surgery;  Laterality: N/A;  . TOTAL SHOULDER ARTHROPLASTY Right 12/18/2015   Procedure: TOTAL SHOULDER ARTHROPLASTY;  Surgeon: [name], MD;  Location: [facility];  Service: Orthopedics;  Laterality: Right;  . TRANSCATHETER AORTIC VALVE REPLACEMENT, TRANSFEMORAL N/A 10/14/2016   Procedure: TRANSCATHETER AORTIC VALVE REPLACEMENT, TRANSFEMORAL;  Surgeon: Sherren Mocha, MD;  Location: [facility];  Service: Open Heart Surgery;  Laterality: N/A;  . TRIGGER FINGER RELEASE Left   . VAGINAL CYST REMOVED  1967    Prior to Admission medications   Medication Sig Start Date End Date Taking? Authorizing Provider  acetaminophen (TYLENOL) 500 MG tablet Take 500 mg by mouth every 6 (six) hours as needed for mild pain.   Yes [provider]  ADVAIR DISKUS 100-50 MCG/DOSE AEPB Inhale 1 inhalation two  times daily for breathing 07/07/16  Yes Estill Dooms, MD  albuterol (PROVENTIL HFA;VENTOLIN HFA) 108 (90 Base) MCG/ACT inhaler Inhale 2 puffs into the lungs every 6 (six) hours as needed for wheezing or shortness of breath. 01/20/17  Yes Estill Dooms, MD  albuterol (PROVENTIL) (2.5 MG/3ML) 0.083% nebulizer solution Take 3 mLs (2.5 mg total) by nebulization every 4 (four) hours. And as needed Patient taking differently: Take 2.5 mg by nebulization every 4 (four) hours as needed for wheezing or shortness of breath.  10/28/16  Yes [place], [place], MD  amiodarone  (PACERONE) 200 MG tablet Take one tablet daily by mouth Patient taking differently: Take 200 mg by mouth daily.  01/20/17  Yes Estill Dooms, MD  apixaban (ELIQUIS) 5 MG TABS tablet Take 1 tablet (5 mg total) by mouth 2 (two) times daily. 10/16/16  Yes Barrett, Erin R, PA-C  cetirizine (ZYRTEC) 10 MG tablet Take 10 mg by mouth daily as needed for allergies.    Yes [provider]  cholecalciferol (VITAMIN D) 1000 units tablet Take 1,000 Units by mouth daily.   Yes [provider]  COMFORT LANCETS MISC Use daily to get blood for glucometer 03/08/14  Yes Estill Dooms, MD  doxazosin (CARDURA) 2 MG tablet Take 1 tablet (2 mg total) by mouth daily. 02/25/17  Yes Martinique, [name] M, MD  ferrous sulfate 325 (65 FE) MG EC tablet Take 325 mg by mouth 2 (two) times daily.   Yes [provider]  furosemide (LASIX) 40 MG tablet Take 1 tablet (40 mg total) by mouth daily. 12/23/16  Yes [name], NP  glimepiride (AMARYL) 2 MG tablet TAKE 1 TABLET BY MOUTH AT  BREAKFAST TO CONTROL  GLUCOSE 01/20/17  Yes Estill Dooms, MD  glucose blood (BAYER CONTOUR TEST) test strip Use daily too check glucose 03/08/14  Yes Estill Dooms, MD  Ketotifen Fumarate (ALLERGY EYE DROPS OP) Place 1 drop into both eyes 3 (three) times daily as needed (allergies).    Yes [provider]  levothyroxine (SYNTHROID, LEVOTHROID) 50 MCG tablet Take 1 tablet by mouth  daily for thyroid  supplement 12/23/16  Yes [name], NP  lisinopril (PRINIVIL,ZESTRIL) 10 MG tablet Take 1 tablet (10 mg total) by mouth daily. 10/16/16  Yes Barrett, Erin R, PA-C  metFORMIN (GLUCOPHAGE) 500 MG tablet Take 1 tablet (500 mg total) by mouth 2 (two) times daily with a meal. 01/20/17  Yes Estill Dooms, MD  mirabegron ER (MYRBETRIQ) 25 MG TB24 tablet One daily to help bladder control Patient taking differently: Take 25 mg by mouth daily.  01/20/17  Yes Estill Dooms, MD  pantoprazole (PROTONIX) 40 MG tablet Take  1 tablet (40 mg total) by mouth 2 (two) times daily. 02/21/17  Yes Alveda Reasons, MD  potassium gluconate 595 (99 K) MG TABS tablet Take 595 mg by mouth daily. TAKES ONLY WHEN USING FUROSEMIDE   Yes [provider]  pravastatin (PRAVACHOL) 20 MG tablet TAKE 1 TABLET BY MOUTH  DAILY 08/26/16  Yes Estill Dooms, MD  triamcinolone (KENALOG) 0.025 % cream Apply 1 application topically daily as needed (psoriasis).    Yes [provider]  chlorhexidine (PERIDEX) 0.12 % solution Rinse with 15 mls twice daily for 30 seconds. Use after breakfast and at bedtime. Spit out excess. Do not swallow. Patient not taking: Reported on 03/11/2017 09/22/16   Lenn Cal, DDS  fluticasone furoate-vilanterol (BREO ELLIPTA) 100-25 MCG/INH AEPB Inhale into the lungs once daily 01/20/17   Estill Dooms, MD    Current Facility-Administered Medications  Medication Dose Route Frequency Provider Last Rate Last Dose  . 0.9 %  sodium chloride infusion   Intravenous Once Etta Quill, DO      . acetaminophen (TYLENOL) tablet 650 mg  650 mg Oral Q6H PRN Etta Quill, DO       Or  . acetaminophen (TYLENOL) suppository 650 mg  650 mg Rectal Q6H PRN Etta Quill, DO      . albuterol (PROVENTIL) (2.5 MG/3ML) 0.083% nebulizer solution 2.5 mg  2.5 mg Inhalation Q6H PRN Etta Quill, DO      . albuterol (PROVENTIL) (2.5 MG/3ML) 0.083% nebulizer solution 2.5 mg  2.5 mg Nebulization QID Heriberto Antigua, MD      . amiodarone (PACERONE) tablet 200 mg  200 mg Oral Daily Alcario Drought, Jared M, DO      . ferrous sulfate tablet 325 mg  325 mg Oral BID Alcario Drought, Jared M, DO      . fluticasone furoate-vilanterol (BREO ELLIPTA) 100-25 MCG/INH 1 puff  1 puff Inhalation Daily Alcario Drought, Jared M, DO      . insulin aspart (novoLOG) injection 0-9 Units  0-9 Units Subcutaneous Q4H Alcario Drought, Jared M, DO      . levothyroxine (SYNTHROID, LEVOTHROID) tablet 50 mcg  50 mcg Oral QAC breakfast Jennette Kettle M, DO      .  loratadine (CLARITIN) tablet 10 mg  10 mg Oral Daily Jennette Kettle M, DO      . mirabegron ER [facility]) tablet 25 mg  25 mg Oral Daily Alcario Drought, Jared M, DO      . ondansetron [facility]) tablet 4 mg  4 mg Oral  Q6H PRN Etta Quill, DO       Or  . ondansetron [facility]) injection 4 mg  4 mg Intravenous Q6H PRN Etta Quill, DO      . pantoprazole (PROTONIX) 80 mg in sodium chloride 0.9 % 250 mL (0.32 mg/mL) infusion  8 mg/hr Intravenous Continuous Etta Quill, DO 25 mL/hr at 03/12/17 0002 8 mg/hr at 03/12/17 0002  . [START ON 03/15/2017] pantoprazole (PROTONIX) injection 40 mg  40 mg Intravenous Q12H Jennette Kettle M, DO      . sodium chloride flush (NS) 0.9 % injection 3 mL  3 mL Intravenous Q12H Jennette Kettle M, DO   3 mL at 03/11/17 2159    Allergies as of 03/11/2017 - Review Complete 03/11/2017  Allergen Reaction Noted  . Codeine Other (See Comments) 10/07/2012  . Vesicare [solifenacin] Itching 03/02/2013    Family History  Problem Relation Age of Onset  . Diabetes Father   . Stroke Father   . Heart disease Father   . Liver cancer Sister   . Heart disease Brother   . Asthma Sister   . Arthritis Sister     Social History   Social History  . Marital status: Divorced    Spouse name: N/A  . Number of children: 1  . Years of education: N/A   Occupational History  . Retired Market researcher for [employer] History Main Topics  . Smoking status: Former Smoker    Packs/day: 1.00    Years: 40.00    Quit date: 09/07/1989  . Smokeless tobacco: Never Used  . Alcohol use No  . Drug use: No  . Sexual activity: No   Other Topics Concern  . Not on file   Social History Narrative   Her daughter & 3 grandchildren live in the area.      Review of Systems: Positive HEN:IDPOEU, dizziness, coffee-ground emesis, black stools GI: Described in detail in HPI.    Gen: Denies any fever, chills, rigors, night sweats, anorexia, fatigue, malaise, involuntary  weight loss, and sleep disorder CV: Denies chest pain, angina, palpitations, syncope, orthopnea, PND, peripheral edema, and claudication. Resp: Denies dyspnea, cough, sputum, wheezing, coughing up blood. GU : Denies urinary burning, blood in urine, urinary frequency, urinary hesitancy, nocturnal urination, and urinary incontinence. MS: Denies joint pain or swelling.  Denies muscle weakness, cramps, atrophy.  Derm: Denies rash, itching, oral ulcerations, hives, unhealing ulcers.  Psych: Denies depression, anxiety, memory loss, suicidal ideation, hallucinations,  and confusion. Heme: Denies bruising, bleeding, and enlarged lymph nodes. Neuro:  Denies any headaches, paresthesias. Endo:  She has DM and hypothyroidism, adrenal function.  Physical Exam: Vital signs in last 24 hours: Temp:  [98 F (36.7 C)-98.4 F (36.9 C)] 98.3 F (36.8 C) (05/17 0437) Pulse Rate:  [63-76] 67 (05/17 0437) Resp:  [14-19] 18 (05/17 0437) BP: (110-177)/(40-92) 149/48 (05/17 0437) SpO2:  [89 %-100 %] 95 % (05/17 0437) Weight:  [93.9 kg (207 lb)] 93.9 kg (207 lb) (05/17 0054) Last BM Date: 03/11/17  General:   Alert,  Well-developed, well-nourished, pleasant and cooperative in NAD Head:  Normocephalic and atraumatic. Eyes:  Sclera clear, no icterus.   Conjunctiva pale. Ears:  Normal auditory acuity. Nose:  No deformity, discharge,  or lesions. Mouth:  No deformity or lesions.  Oropharynx pink & moist. Neck:  Supple; no masses or thyromegaly. Lungs:  Clear throughout to auscultation.   No wheezes, crackles, or rhonchi. No acute distress. Heart:  irregular  rate and rhythm; no murmurs, clicks, rubs,  or gallops. Extremities:  Without clubbing or edema. Neurologic:  Alert and  oriented x4;  grossly normal neurologically. Skin:  Intact without significant lesions or rashes. Psych:  Alert and cooperative. Normal mood and affect. Abdomen:  Soft, nontender and nondistended. No masses, hepatosplenomegaly or hernias  noted. Normal bowel sounds, without guarding, and without rebound.         Lab Results:  Recent Labs  03/11/17 1448 03/12/17 0143 03/12/17 0749  WBC 5.5 6.0 6.7  HGB 7.2* 8.0* 9.1*  HCT 24.0* 24.3* 28.8*  PLT 238 183 216   BMET  Recent Labs  03/11/17 1448 03/12/17 0143  NA 136 137  K 4.1 4.1  CL 101 106  CO2 23 23  GLUCOSE 217* 90  BUN 32* 33*  CREATININE 2.22* 1.94*  CALCIUM 8.6* 8.2*   LFT  Recent Labs  03/11/17 1448  PROT 6.6  ALBUMIN 3.5  AST 30  ALT 34  ALKPHOS 52  BILITOT 0.4   PT/INR  Recent Labs  03/11/17 1953  LABPROT 16.2*  INR 1.29    Studies/Results: No results found.  Colonoscopy from October 2013 showed a  Left sided diverticulosis and 1 sessile polyp each in ascending and sigmoid colon.  Impression: Coffee-ground emesis, dark stools, normocytic anemia Hb 7.2 on admission, subsequently 8-9.1 after 1 unit PRBC transfusion, elevated BUN/creatinine ratio(33/1.94), FOBT positive stool Differential diagnosis includes Mallory-Weiss tear, peptic ulcer disease and less likely malignancy  Plan: Plan diagnostic EGD in a.m., okay to put patient on full liquid diet and keep nothing by mouth post midnight. Continue to hold Eliquis until EGD is performed. Currently on IV Protonix drip at 8 mg per hour and remains hemodynamically stable   LOS: 1 day   Patricia [name]   03/12/2017, 8:51 AM  Pager [phone] If no answer or after 5 PM call [phone]

## 2017-03-14 ENCOUNTER — Encounter (HOSPITAL_COMMUNITY): Payer: Self-pay | Admitting: *Deleted

## 2017-03-14 ENCOUNTER — Encounter (HOSPITAL_COMMUNITY): Payer: Self-pay | Admitting: Gastroenterology

## 2017-03-14 NOTE — Progress Notes (Signed)
Progress Notes Date of Service: 02/16/2017 2:04 PM Rowe Pavy, RN  Cardiac Rehab    _0 Hide copied text _1 Hover for attribution information Discharge Summary  Patient Details  Name: Patricia [name] MRN: [id number] Date of Birth: 1936/03/02 Referring Provider:     CARDIAC REHAB PHASE II ORIENTATION from 11/27/2016 in [place]  Referring Provider  Sherren Mocha MD       Number of Visits: 12  Reason for Discharge:  Early Exit:  Personal pt with additional medical issues that are preventing pt from returning on a consistent basis.  Pt opted to discharge self.  Pt last exercised on 12/26/16  Smoking History:      History  Smoking Status  . Former Smoker  . Packs/day: 1.00  . Years: 40.00  . Quit date: 09/07/1989  Smokeless Tobacco  . Never Used    Diagnosis:  No diagnosis found.  ADL [facility]:  Initial Exercise Prescription:             Initial Exercise Prescription - 11/27/16 1200            Date of Initial Exercise RX and Referring Provider   Date 11/27/16   Referring Provider Sherren Mocha MD       Recumbant Bike   Level --   Minutes --   METs --       NuStep   Level 1   Minutes 20   METs 1.3       Arm Ergometer   Level 1   Minutes 10   METs 1       Prescription Details   Frequency (times per week) 3   Duration Progress to 30 minutes of continuous aerobic without signs/symptoms of physical distress       Intensity   THRR 40-80% of Max Heartrate 57-114   Ratings of Perceived Exertion 11-13   Perceived Dyspnea 0-4       Progression   Progression Continue progressive overload as per policy without signs/symptoms or physical distress.       Resistance Training   Training Prescription Yes   Weight 1lb   Reps 10-12      Discharge Exercise Prescription (Final Exercise Prescription Changes):             Exercise Prescription Changes - 12/26/16  1542            Response to Exercise   Blood Pressure (Admit) 130/56   Blood Pressure (Exercise) 144/58   Blood Pressure (Exit) 122/58   Heart Rate (Admit) 78 bpm   Heart Rate (Exercise) 88 bpm   Heart Rate (Exit) 77 bpm   Rating of Perceived Exertion (Exercise) 13   Symptoms none   Duration Continue with 30 min of aerobic exercise without signs/symptoms of physical distress.   Intensity THRR unchanged       Progression   Progression Continue to progress workloads to maintain intensity without signs/symptoms of physical distress.   Average METs 2.1       Resistance Training   Training Prescription Yes   Weight 2kbs   Reps 10-15   Time 10 Minutes       NuStep   Level 3   Minutes 20   METs 1.9       Arm Ergometer   Level 1   Minutes 10   METs 2.35       Home Exercise Plan   Plans to continue exercise at Home (comment)   Frequency  Add 3 additional days to program exercise sessions.   Initial Home Exercises Provided 12/10/16      Functional Capacity:                 6 Minute Walk    Row Name 11/27/16 [phone] 11/27/16 1031 11/27/16 1228         6 Minute Walk   Phase Initial  -  -   Distance 400 feet  -  -   Walk Time 2.16 minutes  -  -   # of Rest Breaks 1  pt stopped _0 .16 and did not want to continue  -  -   MPH  -  - 2.1   METS  -  - 0.35   RPE 13  -  -   VO2 Peak  -  - 1.23   Symptoms Yes (comment)  -  -   Comments 9/10 R knee pain , 3/10 R hip pain 9/10 R knee pain , 3/10 R hip pain- pt did not continue with walk test due to hip/knee pain  -   Resting HR 63 bpm  -  -   Resting BP 150/60  -  -   Max Ex. HR 89 bpm  -  -   Max Ex. BP 162/64  -  -   2 Minute Post BP 152/66  -  -      Psychological, QOL, Others - Outcomes: PHQ 2/9: Depression screen [facility] 2/9 02/18/2017 02/09/2017 12/01/2016 09/16/2016 01/23/2016  Decreased Interest 0 - 0 0 0  Down, Depressed, Hopeless 0 0 0 0 0  PHQ - 2 Score 0  0 0 0 0    Quality of Life:         Quality of Life - 11/27/16 1257            Quality of Life Scores   Health/Function Pre 19.27 %   Socioeconomic Pre 23.42 %   Psych/Spiritual Pre 22.5 %   Family Pre 30 %   GLOBAL Pre 22.05 %      Personal Goals: Goals established at orientation with interventions provided to work toward goal.             Personal Goals and Risk Factors at Admission - 11/27/16 1249            Core Components/Risk Factors/Patient Goals on Admission    Weight Management Yes;Obesity;Weight Loss   Intervention Weight Management: Develop a combined nutrition and exercise program designed to reach desired caloric intake, while maintaining appropriate intake of nutrient and fiber, sodium and fats, and appropriate energy expenditure required for the weight goal.;Weight Management: Provide education and appropriate resources to help participant work on and attain dietary goals.;Obesity: Provide education and appropriate resources to help participant work on and attain dietary goals.;Weight Management/Obesity: Establish reasonable short term and long term weight goals.   Expected Outcomes Short Term: Continue to assess and modify interventions until short term weight is achieved;Weight Maintenance: Understanding of the daily nutrition guidelines, which includes 25-35% calories from fat, 7% or less cal from saturated fats, less than 272m cholesterol, less than 1.5gm of sodium, & 5 or more servings of fruits and vegetables daily;Long Term: Adherence to nutrition and physical activity/exercise program aimed toward attainment of established weight goal;Weight Loss: Understanding of general recommendations for a balanced deficit meal plan, which promotes 1-2 lb weight loss per week and includes a negative energy balance of [phone] kcal/d;Understanding recommendations for meals to include 15-35% energy  as protein, 25-35% energy from fat, 35-60% energy from  carbohydrates, less than 237m of dietary cholesterol, 20-35 gm of total fiber daily;Understanding of distribution of calorie intake throughout the day with the consumption of 4-5 meals/snacks   Sedentary Yes   Intervention Provide advice, education, support and counseling about physical activity/exercise needs.;Develop an individualized exercise prescription for aerobic and resistive training based on initial evaluation findings, risk stratification, comorbidities and participant's personal goals.   Expected Outcomes Achievement of increased cardiorespiratory fitness and enhanced flexibility, muscular endurance and strength shown through measurements of functional capacity and personal statement of participant.   Increase Strength and Stamina Yes   Intervention Provide advice, education, support and counseling about physical activity/exercise needs.;Develop an individualized exercise prescription for aerobic and resistive training based on initial evaluation findings, risk stratification, comorbidities and participant's personal goals.   Expected Outcomes Achievement of increased cardiorespiratory fitness and enhanced flexibility, muscular endurance and strength shown through measurements of functional capacity and personal statement of participant.   Improve shortness of breath with ADL's Yes   Intervention Provide education, individualized exercise plan and daily activity instruction to help decrease symptoms of SOB with activities of daily living.   Expected Outcomes Short Term: Achieves a reduction of symptoms when performing activities of daily living.   Diabetes Yes   Intervention Provide education about signs/symptoms and action to take for hypo/hyperglycemia.;Provide education about proper nutrition, including hydration, and aerobic/resistive exercise prescription along with prescribed medications to achieve blood glucose in normal ranges: Fasting glucose 65-99 mg/dL   Expected  Outcomes Short Term: Participant verbalizes understanding of the signs/symptoms and immediate care of hyper/hypoglycemia, proper foot care and importance of medication, aerobic/resistive exercise and nutrition plan for blood glucose control.;Long Term: Attainment of HbA1C < 7%.   Hypertension Yes   Intervention Provide education on lifestyle modifcations including regular physical activity/exercise, weight management, moderate sodium restriction and increased consumption of fresh fruit, vegetables, and low fat dairy, alcohol moderation, and smoking cessation.;Monitor prescription use compliance.   Expected Outcomes Short Term: Continued assessment and intervention until BP is < 140/975mHG in hypertensive participants. < 130/8064mG in hypertensive participants with diabetes, heart failure or chronic kidney disease.;Long Term: Maintenance of blood pressure at goal levels.   Lipids Yes   Intervention Provide education and support for participant on nutrition & aerobic/resistive exercise along with prescribed medications to achieve LDL <70m32mDL >40mg6mExpected Outcomes Short Term: Participant states understanding of desired cholesterol values and is compliant with medications prescribed. Participant is following exercise prescription and nutrition guidelines.;Long Term: Cholesterol controlled with medications as prescribed, with individualized exercise RX and with personalized nutrition plan. Value goals: LDL < 70mg,15m > 40 mg.   Stress Yes   Intervention Offer individual and/or small group education and counseling on adjustment to heart disease, stress management and health-related lifestyle change. Teach and support self-help strategies.;Refer participants experiencing significant psychosocial distress to appropriate mental health specialists for further evaluation and treatment. When possible, include family members and significant others in education/counseling sessions.   Expected Outcomes  Short Term: Participant demonstrates changes in health-related behavior, relaxation and other stress management skills, ability to obtain effective social support, and compliance with psychotropic medications if prescribed.;Long Term: Emotional wellbeing is indicated by absence of clinically significant psychosocial distress or social isolation.       Personal Goals Discharge:  Nutrition & Weight - Outcomes:             Pre Biometrics - 11/27/16 1230  Pre Biometrics   % Body Fat 52.9 %                 Post Biometrics - 02/27/17 1043             Post  Biometrics   Weight 212 lb 15.4 oz (96.6 kg)      Nutrition:             Nutrition Therapy & Goals - 12/15/16 1330            Nutrition Therapy   Diet Carb Modified, Therapeutic Lifestyle Changes       Personal Nutrition Goals   Nutrition Goal Wt loss of 1-2 lb/week to a wt loss goal of 6-24 lb at graduation from [place].       Intervention Plan   Intervention Prescribe, educate and counsel regarding individualized specific dietary modifications aiming towards targeted core components such as weight, hypertension, lipid management, diabetes, heart failure and other comorbidities.   Expected Outcomes Short Term Goal: Understand basic principles of dietary content, such as calories, fat, sodium, cholesterol and nutrients.;Long Term Goal: Adherence to prescribed nutrition plan.      Nutrition Discharge:             Nutrition Assessments - 12/15/16 1330            MEDFICTS Scores   Pre Score 70      Education Questionnaire Score:             Knowledge Questionnaire Score - 11/27/16 1228            Knowledge Questionnaire Score   Pre Score 19/24      Goals reviewed with patient. Pt discharged from cardiac rehab program today with completion of 12 exercise sessions in Phase II. Pt maintained fair attendance up into March 2nd.  Pt did  not return back to exercise due to competing medical issues.  Unable to complete post assessments.  During pt brief participation, pt made progress with improving her shortness of breath and was able to exercise without using oxygen therapy. Pt was very excited that she did not have to wear oxygen and could maintain her o2 saturations. Pt hopes to return at some point when she is medically stable to do so. Maurice Small RN, BSN Cardiac and Pulmonary Rehab Nurse Navigator       Electronically signed by Rowe Pavy, RN at 03/14/2017 5:45 PM      CARDIAC REHAB PHASE II EXERCISE on 02/18/2017        Detailed Report

## 2017-03-16 ENCOUNTER — Encounter (HOSPITAL_COMMUNITY): Payer: Medicare Other

## 2017-03-16 ENCOUNTER — Other Ambulatory Visit: Payer: Self-pay | Admitting: Physician Assistant

## 2017-03-16 ENCOUNTER — Encounter: Payer: Self-pay | Admitting: Nurse Practitioner

## 2017-03-16 ENCOUNTER — Ambulatory Visit (INDEPENDENT_AMBULATORY_CARE_PROVIDER_SITE_OTHER): Payer: Medicare Other | Admitting: Nurse Practitioner

## 2017-03-16 ENCOUNTER — Other Ambulatory Visit: Payer: Self-pay | Admitting: Internal Medicine

## 2017-03-16 VITALS — BP 154/68 | HR 68 | Temp 97.7°F | Resp 17 | Ht 63.0 in | Wt 211.4 lb

## 2017-03-16 DIAGNOSIS — I10 Essential (primary) hypertension: Secondary | ICD-10-CM | POA: Diagnosis not present

## 2017-03-16 DIAGNOSIS — E119 Type 2 diabetes mellitus without complications: Secondary | ICD-10-CM | POA: Diagnosis not present

## 2017-03-16 DIAGNOSIS — D649 Anemia, unspecified: Secondary | ICD-10-CM | POA: Diagnosis not present

## 2017-03-16 DIAGNOSIS — K922 Gastrointestinal hemorrhage, unspecified: Secondary | ICD-10-CM | POA: Diagnosis not present

## 2017-03-16 DIAGNOSIS — I480 Paroxysmal atrial fibrillation: Secondary | ICD-10-CM | POA: Diagnosis not present

## 2017-03-16 DIAGNOSIS — N179 Acute kidney failure, unspecified: Secondary | ICD-10-CM | POA: Diagnosis not present

## 2017-03-16 DIAGNOSIS — I5032 Chronic diastolic (congestive) heart failure: Secondary | ICD-10-CM

## 2017-03-16 LAB — CBC WITH DIFFERENTIAL/PLATELET
Basophils Absolute: 50 cells/uL (ref 0–200)
Basophils Relative: 1 %
Eosinophils Absolute: 150 cells/uL (ref 15–500)
Eosinophils Relative: 3 %
HCT: 26 % — ABNORMAL LOW (ref 35.0–45.0)
Hemoglobin: 8.3 g/dL — ABNORMAL LOW (ref 11.7–15.5)
Lymphocytes Relative: 18 %
Lymphs Abs: 900 cells/uL (ref 850–3900)
MCH: 28.3 pg (ref 27.0–33.0)
MCHC: 31.9 g/dL — ABNORMAL LOW (ref 32.0–36.0)
MCV: 88.7 fL (ref 80.0–100.0)
MPV: 9.6 fL (ref 7.5–12.5)
Monocytes Absolute: 450 cells/uL (ref 200–950)
Monocytes Relative: 9 %
Neutro Abs: 3450 cells/uL (ref 1500–7800)
Neutrophils Relative %: 69 %
Platelets: 255 10*3/uL (ref 140–400)
RBC: 2.93 MIL/uL — ABNORMAL LOW (ref 3.80–5.10)
RDW: 18.1 % — ABNORMAL HIGH (ref 11.0–15.0)
WBC: 5 10*3/uL (ref 3.8–10.8)

## 2017-03-16 NOTE — Progress Notes (Addendum)
Careteam: Patient Care Team: Estill Dooms, MD as PCP - General (Internal Medicine) [name], MD as Consulting Physician (Orthopedic Surgery) Druscilla Brownie, MD as Consulting Physician (Dermatology) Ralene Bathe, MD as Consulting Physician (Ophthalmology)  Advanced Directive information Does Patient Have a Medical Advance Directive?: No  Allergies  Allergen Reactions  . Codeine Other (See Comments)    "Spaces out" the patient and she cannot walk well  . Vesicare [Solifenacin] Itching    Chief Complaint  Patient presents with  . Transitions Of Care    Pt was admitted to [facility] 5/16 to 5/18 for GI bleed   HPI: Patient is a 81 y.o. female seen in the office today for hospital follow up. PMH of PAF, s/p TEE/DCCV>on Amiodarone &Eliquis, TAVR (Dr. Martinique), GERD, HLD, DM 2, HTN, hypothyroid who presented with 2 episodes of coffee-ground emesis a week prior to admission then on 03/10/15, dizzy and lightheaded without syncope. She has chronically black stools while on PO Iron. Did not report blood in emesis or stools. She was supposed to see her GI in [facility] for an EGD today but due to ongoing symptoms, her gastroenterologist recommended hospital admission. hgb was 7.2 and she received 1 units PRBC and had EGD.  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 OneEndo Clip was placedandAPC was performed to destroy both AVMs. No bleeding noted at the end of the procedure. Multiple gastric body polyps noted,most likely related to fundic gland polyposis,biopsies were not taken.She resumed Eliquis. Pt was instructed to hold lisinopril and metformin due to AKD. She did not hold lisinopril or metformin because she thought this was a mistake.  No more obvious bleeding. She has dark stools due to iron.  No more vomiting. GI follow up has not been made.  Blood sugars normally 120s fasting. Blood pressure at home was  122/50  Review of Systems:  Review of Systems  Constitutional: Negative for chills, fever, malaise/fatigue and weight loss.  HENT: Negative for tinnitus.   Respiratory: Positive for shortness of breath (hx of asthma, chronic and stable). Negative for cough and sputum production.   Cardiovascular: Negative for chest pain, palpitations and leg swelling.  Gastrointestinal: Negative for abdominal pain, constipation, diarrhea and heartburn.       Dark stools  Genitourinary: Negative for dysuria, frequency and urgency.  Musculoskeletal: Negative for back pain, falls, joint pain and myalgias.  Skin: Negative.   Neurological: Negative for dizziness, weakness and headaches.  Psychiatric/Behavioral: Negative for depression and memory loss. The patient does not have insomnia.     Past Medical History:  Diagnosis Date  . Allergy   . Anemia, unspecified   . Arthritis   . Asthma   . Atrial fibrillation status post cardioversion [facility]) 09/2015   s/p TEE/DCCV>>SR on amio  . Benign neoplasm of colon   . Carpal tunnel syndrome   . Cellulitis and abscess of finger, unspecified   . Cerumen impaction   . Cervicalgia   . CHF (congestive heart failure) ([place])   . Chronic airway obstruction, not elsewhere classified   . Chronic anticoagulation 09/2015   Xarelto for afib, CHADS2VASC=5  . Diarrhea   . Diffuse cystic mastopathy   . Dysrhythmia   . Edema   . Encounter for long-term (current) use of other medications   . GERD (gastroesophageal reflux disease)   . Heart murmur   . Lumbago   . Neck pain 05/23/2015  . Obesity,  unspecified   . Other and unspecified hyperlipidemia   . Other psoriasis   . Pain in joint, lower leg   . PONV (postoperative nausea and vomiting)   . Primary localized osteoarthrosis of right shoulder 12/18/2015  . Routine gynecological examination   . 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 Scoliosis (and kyphoscoliosis), idiopathic   . Severe aortic stenosis   . Shortness of breath dyspnea    with exertion  . Type II or unspecified type diabetes mellitus without mention of complication, uncontrolled   . Unspecified essential hypertension   . Unspecified hemorrhoids without mention of complication   . Unspecified hypothyroidism   . Urge incontinence    Past Surgical History:  Procedure Laterality Date  . ABDOMINAL HYSTERECTOMY  1987   complete  . BREAST SURGERY     right breast 3 surgeries [phone]  . CARDIAC CATHETERIZATION N/A 09/02/2016   Procedure: Right/Left Heart Cath and Coronary Angiography;  Surgeon: [name] M Martinique, MD;  Location: [place] CV [facility];  Service: Cardiovascular;  Laterality: N/A;  . CARDIOVERSION N/A 10/19/2015   Procedure: CARDIOVERSION;  Surgeon: Lelon Perla, MD;  Location: [place];  Service: Cardiovascular;  Laterality: N/A;  . [place]  . COLON SURGERY     2004,2007,2013.  3 times colon surgieres  . ESOPHAGOGASTRODUODENOSCOPY (EGD) WITH PROPOFOL Left 03/13/2017   Procedure: ESOPHAGOGASTRODUODENOSCOPY (EGD) WITH PROPOFOL;  Surgeon: Ronnette Juniper, MD;  Location: [place];  Service: Gastroenterology;  Laterality: Left;  . EXCISE LE MANDIBULAR LYMPH NODE T     DR C. NEWMAN  . FOOT SURGERY  1989  . LEFT SHOULDER ARTHROSCOPY    . 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 NEUROPLASTY / TRANSPOSITION MEDIAN NERVE AT CARPAL TUNNEL BILATERAL  05/2003  . TEE WITHOUT CARDIOVERSION N/A 10/19/2015   Procedure: Transesophageal Echocardiogram (TEE) ;  Surgeon: Lelon Perla, MD;  Location: [facility] ENDOSCOPY;  Service: Cardiovascular;  Laterality: N/A;  . TEE WITHOUT CARDIOVERSION N/A 10/14/2016   Procedure: TRANSESOPHAGEAL ECHOCARDIOGRAM (TEE);  Surgeon: Sherren Mocha, MD;  Location: [place];  Service: Open Heart Surgery;  Laterality: N/A;  . TOTAL SHOULDER ARTHROPLASTY Right  12/18/2015   Procedure: TOTAL SHOULDER ARTHROPLASTY;  Surgeon: [name], MD;  Location: [place];  Service: Orthopedics;  Laterality: Right;  . TRANSCATHETER AORTIC VALVE REPLACEMENT, TRANSFEMORAL N/A 10/14/2016   Procedure: TRANSCATHETER AORTIC VALVE REPLACEMENT, TRANSFEMORAL;  Surgeon: Sherren Mocha, MD;  Location: [place];  Service: Open Heart Surgery;  Laterality: N/A;  . TRIGGER FINGER RELEASE Left   . VAGINAL CYST REMOVED  1967   Social History:   reports that she quit smoking about 27 years ago. She has a 40.00 pack-year smoking history. She has never used smokeless tobacco. She reports that she does not drink alcohol or use drugs.  Family History  Problem Relation Age of Onset  . Diabetes Father   . Stroke Father   . Heart disease Father   . Liver cancer Sister   . Heart disease Brother   . Asthma Sister   . Arthritis Sister     Medications: Patient's Medications  New Prescriptions   No medications on file  Previous Medications   ACETAMINOPHEN (TYLENOL) 500 MG TABLET    Take 500 mg by mouth every 6 (six) hours as needed for mild pain.   ADVAIR DISKUS 100-50 MCG/DOSE AEPB    Inhale  1 inhalation two  times daily for breathing   ALBUTEROL (PROVENTIL HFA;VENTOLIN HFA) 108 (90 BASE) MCG/ACT INHALER    Inhale 2 puffs into the lungs every 6 (six) hours as needed for wheezing or shortness of breath.   ALBUTEROL (PROVENTIL) (2.5 MG/3ML) 0.083% NEBULIZER SOLUTION    Take 3 mLs (2.5 mg total) by nebulization every 4 (four) hours as needed for wheezing or shortness of breath.   AMIODARONE (PACERONE) 200 MG TABLET    Take one tablet daily by mouth   APIXABAN (ELIQUIS) 5 MG TABS TABLET    Take 1 tablet (5 mg total) by mouth 2 (two) times daily.   CETIRIZINE (ZYRTEC) 10 MG TABLET    Take 10 mg by mouth daily as needed for allergies.    CHOLECALCIFEROL (VITAMIN D) 1000 UNITS TABLET    Take 1,000 Units by mouth daily.   COMFORT LANCETS MISC    Use daily to get blood for glucometer    DOXAZOSIN (CARDURA) 2 MG TABLET    Take 1 tablet (2 mg total) by mouth daily.   FERROUS SULFATE 325 (65 FE) MG EC TABLET    Take 325 mg by mouth 2 (two) times daily.   FLUTICASONE FUROATE-VILANTEROL (BREO ELLIPTA) 100-25 MCG/INH AEPB    Inhale into the lungs once daily   FUROSEMIDE (LASIX) 40 MG TABLET    Take 1 tablet (40 mg total) by mouth daily.   GLIMEPIRIDE (AMARYL) 2 MG TABLET    TAKE 1 TABLET BY MOUTH AT  BREAKFAST TO CONTROL  GLUCOSE   GLUCOSE BLOOD (BAYER CONTOUR TEST) TEST STRIP    Use daily too check glucose   KETOTIFEN FUMARATE (ALLERGY EYE DROPS OP)    Place 1 drop into both eyes 3 (three) times daily as needed (allergies).    LEVOTHYROXINE (SYNTHROID, LEVOTHROID) 50 MCG TABLET    Take 1 tablet by mouth  daily for thyroid  supplement   LISINOPRIL (PRINIVIL,ZESTRIL) 10 MG TABLET    Take 10 mg by mouth daily.   METFORMIN (GLUCOPHAGE) 500 MG TABLET       MIRABEGRON ER (MYRBETRIQ) 25 MG TB24 TABLET    One daily to help bladder control   PANTOPRAZOLE (PROTONIX) 40 MG TABLET    Take 1 tablet (40 mg total) by mouth 2 (two) times daily.   POTASSIUM GLUCONATE 595 (99 K) MG TABS TABLET    Take 595 mg by mouth daily. TAKES ONLY WHEN USING FUROSEMIDE   PRAVASTATIN (PRAVACHOL) 20 MG TABLET    TAKE 1 TABLET BY MOUTH  DAILY   TRIAMCINOLONE (KENALOG) 0.025 % CREAM    Apply 1 application topically daily as needed (psoriasis).   Modified Medications   No medications on file  Discontinued Medications   No medications on file     Physical Exam:  Vitals:   03/16/17 0837  BP: (!) 154/68  Pulse: 68  Resp: 17  Temp: 97.7 F (36.5 C)  TempSrc: Oral  SpO2: 97%  Weight: 211 lb 6.4 oz (95.9 kg)  Height: _0  (1.6 m)   Body mass index is 37.45 kg/m.  Physical Exam  Constitutional: She is oriented to person, place, and time. No distress.  Morbid obesity  HENT:  Head: Normocephalic and atraumatic.  Nose: Nose normal.  Mouth/Throat: Oropharynx is clear and moist.  Eyes: Conjunctivae and  EOM are normal.  Neck: Normal range of motion. Neck supple. No JVD present. No tracheal deviation present. No thyromegaly present.  Cardiovascular: Normal rate and regular rhythm.  Exam reveals no gallop and no friction rub.   Murmur (2/6 SEM at the aortic area and LSB) heard. 3/6 aortic ejection murmur  Pulmonary/Chest: No respiratory distress. She has no wheezes. She exhibits no tenderness.  Abdominal: Bowel sounds are normal. She exhibits no distension and no mass. There is no tenderness.  Musculoskeletal: Normal range of motion. She exhibits no edema or tenderness.  Lymphadenopathy:    She has no cervical adenopathy.  Neurological: She is alert and oriented to person, place, and time. She has normal reflexes. No cranial nerve deficit. Coordination normal.  Skin: No rash noted. No erythema. No pallor.  Psychiatric: She has a normal mood and affect. Judgment and thought content normal.    Labs reviewed: Basic Metabolic Panel:  Recent Labs  04/07/16 1037 04/28/16 0940  09/12/16 0023  10/15/16 0400  03/11/17 1448 03/12/17 0143 03/13/17 0628  NA 137 139  < >  --   < > 138  < > 136 137 140  K 4.3 4.2  < >  --   < > 4.6  < > 4.1 4.1 4.7  CL 99 95*  < >  --   < > 101  < > 101 106 110  CO2 27 27  < >  --   < > 30  < > _0 GLUCOSE 98 119*  < >  --   < > 118*  < > 217* 90 112*  BUN 32* 27  < >  --   < > 23*  < > 32* 33* 19  CREATININE 1.27* 1.27*  < >  --   < > 1.24*  < > 2.22* 1.94* 1.45*  CALCIUM 8.9 8.8  < >  --   < > 8.8*  < > 8.6* 8.2* 8.5*  MG  --   --   --   --   --  1.9  --   --   --   --   TSH 4.09 6.030*  --  1.297  --   --   --   --   --   --   < > = values in this interval not displayed.   Estimated Creatinine Clearance: 35.2 mL/min (A) (by C-G formula based on SCr of 1.45 mg/dL (H)).  Liver Function Tests:  Recent Labs  12/23/16 0935 02/18/17 1043 03/11/17 1448  AST 20 32 30  ALT 21 42* 34  ALKPHOS 58 65 52  BILITOT 0.4 0.2 0.4  PROT 6.9 7.0 6.6    ALBUMIN 3.6 4.0 3.5    Recent Labs  12/23/16 0935  LIPASE 34  AMYLASE 38   No results for input(s): AMMONIA in the last 8760 hours. CBC:  Recent Labs  09/12/16 0643  12/23/16 0935 01/20/17 1532  03/12/17 0749 03/13/17 0628 03/13/17 1227  WBC 7.7  < > 5.7 6.0  < > 6.7 5.3 5.1  NEUTROABS 7.0  --  3,876 3,960  --   --   --   --   HGB 9.5*  < > 7.4* 9.5*  < > 9.1* 7.7* 7.6*  HCT 30.3*  < > 24.0* 30.3*  < > 28.8* 23.9* 24.7*  MCV 85.8  < > 86.6 88.1  < > 87.8 87.2 89.2  PLT 231  < > 261 284  < > 216 194 201  < > = values in this interval not displayed. Lipid Panel:  Recent Labs  04/28/16 0940  CHOL 182  HDL 61  LDLCALC 86  TRIG 174*  CHOLHDL 3.0   TSH:  Recent Labs  04/07/16 1037 04/28/16 0940 09/12/16 0023  TSH 4.09 6.030* 1.297   A1C: Lab Results  Component Value Date   HGBA1C 6.8 (H) 10/10/2016     Assessment/Plan 1. AKI (acute kidney injury) ([place]) -BUN/Cr trending down in hospital. Pt has cont on metformin and lisinopril because she was unaware why she was supposed to be holding these medications.  - BMP with eGFR  2. UGIB (upper gastrointestinal bleed) -no further vomiting.  -to follow up with GI.  conts on protonix 40 mg BID - CBC with Differential/Platelets  3. Chronic diastolic CHF (congestive heart failure) (HCC) Stable, remains on lasix  4. Type 2 diabetes mellitus without complication, without long-term current use of insulin (HCC) Blood sugar fasting in the 120s -metformin on hold due to elevated Cr, will follow up today -conts on glimepiride.  5. Anemia, unspecified type -conts on iron twice daily. Will follow up - CBC with Differential/Platelets  6. Paroxysmal atrial fibrillation (HCC) Rate controlled on amiodarone, cont on eliquis twice daily for anticoagulation  7. Essential hypertension Elevated today however pt reports this is due to being in office, reports blood pressure controlled with home readings. To cont on  lisinopril and Cardura at this time.   Total time 40 mins:  time greater than 50% of total time spent doing pt counseled and coordination of care regarding medical management,education and reviewing records.   Carlos American. Harle Battiest  [facility] [phone] 8 am - 5 pm) [phone] (after hours)

## 2017-03-16 NOTE — Patient Instructions (Addendum)
HOLD metformin for now until lab work comes back  Please make follow up with Gastroenterologist.   Follow up in 3 months with lab work prior to appt

## 2017-03-17 ENCOUNTER — Telehealth: Payer: Self-pay | Admitting: Cardiology

## 2017-03-17 ENCOUNTER — Telehealth: Payer: Self-pay

## 2017-03-17 DIAGNOSIS — N179 Acute kidney failure, unspecified: Secondary | ICD-10-CM

## 2017-03-17 LAB — BASIC METABOLIC PANEL WITH GFR
BUN: 21 mg/dL (ref 7–25)
CO2: 22 mmol/L (ref 20–31)
Calcium: 8.7 mg/dL (ref 8.6–10.4)
Chloride: 106 mmol/L (ref 98–110)
Creat: 1.57 mg/dL — ABNORMAL HIGH (ref 0.60–0.93)
GFR, Est African American: 36 mL/min — ABNORMAL LOW (ref >=60)
GFR, Est Non African American: 31 mL/min — ABNORMAL LOW (ref >=60)
Glucose, Bld: 104 mg/dL — ABNORMAL HIGH (ref 65–99)
Potassium: 4.4 mmol/L (ref 3.5–5.3)
Sodium: 139 mmol/L (ref 135–146)

## 2017-03-17 MED ORDER — APIXABAN 5 MG PO TABS: 5.0000 mg | ORAL_TABLET | Freq: Two times a day (BID) | ORAL | 1 refills | Status: DC

## 2017-03-17 NOTE — Telephone Encounter (Signed)
New message    [facility] from [facility] is calling for refill authorization for pt Eliquis.    *STAT* If patient is at the pharmacy, call can be transferred to refill team.   1. Which medications need to be refilled? (please list name of each medication and dose if known) Eliquis 5 mg   2. Which pharmacy/location (including street and city if local pharmacy) is medication to be sent to? [facility] on [address]   3. Do they need a 30 day or 90 day supply? 90 day

## 2017-03-17 NOTE — Telephone Encounter (Signed)
Medication changes were made based on lab results from 03/16/17. Metformin and lisinopril were discontinued.   A repeat BMP was ordered.

## 2017-03-17 NOTE — Telephone Encounter (Signed)
Follow up     Pt is completely out of this medication can you call it in today or call her with samples    Patient calling the office for samples of medication:   1.  What medication and dosage are you requesting samples for? Eliquis 5 mg  2.  Are you currently out of this medication? yes

## 2017-03-17 NOTE — Addendum Note (Signed)
Addended by: [name] on: 03/17/2017 09:51 AM   Modules accepted: Level of Service

## 2017-03-17 NOTE — Telephone Encounter (Signed)
-----  Message from [name], NP sent at 03/17/2017 12:39 PM EDT ----- Kidney function is worse, stop lisinopril and metformin. To check blood pressure and blood sugars fasting daily and blood pressure 1 hour after doxazosin daily  To follow up in 2 weeks in office for follow up on kidneys, blood pressure and blood sugar  Can get BMP lab prior to appt  Also how much fluid is she drinking a day? To avoid NSAIDs. (Aleve, Advil, Motrin, Ibuprofen)

## 2017-03-17 NOTE — Telephone Encounter (Signed)
New Message     Pt would like to speak to you about the Eliquis

## 2017-03-17 NOTE — Telephone Encounter (Signed)
Patient needs a refill on her Eliquis, 30 day.

## 2017-03-18 ENCOUNTER — Encounter (HOSPITAL_COMMUNITY): Payer: Medicare Other

## 2017-03-20 ENCOUNTER — Encounter (HOSPITAL_COMMUNITY): Payer: Medicare Other

## 2017-03-23 ENCOUNTER — Encounter (HOSPITAL_COMMUNITY): Payer: Medicare Other

## 2017-03-24 DIAGNOSIS — K31811 Angiodysplasia of stomach and duodenum with bleeding: Secondary | ICD-10-CM | POA: Diagnosis not present

## 2017-03-25 ENCOUNTER — Encounter (HOSPITAL_COMMUNITY): Payer: Medicare Other

## 2017-03-26 ENCOUNTER — Other Ambulatory Visit: Payer: Medicare Other

## 2017-03-26 DIAGNOSIS — N179 Acute kidney failure, unspecified: Secondary | ICD-10-CM

## 2017-03-26 LAB — BASIC METABOLIC PANEL WITH GFR
BUN: 26 mg/dL — ABNORMAL HIGH (ref 7–25)
CO2: 28 mmol/L (ref 20–31)
Calcium: 8.7 mg/dL (ref 8.6–10.4)
Chloride: 100 mmol/L (ref 98–110)
Creat: 1.5 mg/dL — ABNORMAL HIGH (ref 0.60–0.93)
GFR, Est African American: 38 mL/min — ABNORMAL LOW (ref >=60)
GFR, Est Non African American: 33 mL/min — ABNORMAL LOW (ref >=60)
Glucose, Bld: 151 mg/dL — ABNORMAL HIGH (ref 65–99)
Potassium: 4.6 mmol/L (ref 3.5–5.3)
Sodium: 140 mmol/L (ref 135–146)

## 2017-03-27 ENCOUNTER — Encounter (HOSPITAL_COMMUNITY): Payer: Medicare Other

## 2017-03-30 ENCOUNTER — Encounter (HOSPITAL_COMMUNITY): Payer: Medicare Other

## 2017-03-31 ENCOUNTER — Ambulatory Visit: Payer: Self-pay | Admitting: Nurse Practitioner

## 2017-03-31 ENCOUNTER — Other Ambulatory Visit: Payer: Self-pay | Admitting: *Deleted

## 2017-03-31 ENCOUNTER — Encounter: Payer: Self-pay | Admitting: Internal Medicine

## 2017-03-31 DIAGNOSIS — E119 Type 2 diabetes mellitus without complications: Secondary | ICD-10-CM

## 2017-03-31 MED ORDER — GLUCOSE BLOOD VI STRP: ORAL_STRIP | 4 refills | Status: DC

## 2017-03-31 NOTE — Telephone Encounter (Signed)
Patient requested 

## 2017-04-03 NOTE — Addendum Note (Signed)
Addendum  created 04/03/17 1249 by Lyn Hollingshead, MD   Sign clinical note

## 2017-04-06 ENCOUNTER — Encounter: Payer: Self-pay | Admitting: Nurse Practitioner

## 2017-04-06 ENCOUNTER — Ambulatory Visit (INDEPENDENT_AMBULATORY_CARE_PROVIDER_SITE_OTHER): Payer: Medicare Other | Admitting: Nurse Practitioner

## 2017-04-06 VITALS — BP 148/84 | HR 66 | Temp 97.5°F | Resp 17 | Ht 63.0 in | Wt 215.8 lb

## 2017-04-06 DIAGNOSIS — D649 Anemia, unspecified: Secondary | ICD-10-CM | POA: Diagnosis not present

## 2017-04-06 DIAGNOSIS — I5032 Chronic diastolic (congestive) heart failure: Secondary | ICD-10-CM

## 2017-04-06 DIAGNOSIS — N183 Chronic kidney disease, stage 3 unspecified: Secondary | ICD-10-CM

## 2017-04-06 DIAGNOSIS — I10 Essential (primary) hypertension: Secondary | ICD-10-CM

## 2017-04-06 DIAGNOSIS — E782 Mixed hyperlipidemia: Secondary | ICD-10-CM | POA: Diagnosis not present

## 2017-04-06 DIAGNOSIS — E119 Type 2 diabetes mellitus without complications: Secondary | ICD-10-CM | POA: Diagnosis not present

## 2017-04-06 LAB — CBC WITH DIFFERENTIAL/PLATELET
Basophils Absolute: 52 cells/uL (ref 0–200)
Basophils Relative: 1 %
Eosinophils Absolute: 104 cells/uL (ref 15–500)
Eosinophils Relative: 2 %
HCT: 29.2 % — ABNORMAL LOW (ref 35.0–45.0)
Hemoglobin: 9.1 g/dL — ABNORMAL LOW (ref 11.7–15.5)
Lymphocytes Relative: 20 %
Lymphs Abs: 1040 cells/uL (ref 850–3900)
MCH: 28.1 pg (ref 27.0–33.0)
MCHC: 31.2 g/dL — ABNORMAL LOW (ref 32.0–36.0)
MCV: 90.1 fL (ref 80.0–100.0)
MPV: 10.1 fL (ref 7.5–12.5)
Monocytes Absolute: 676 cells/uL (ref 200–950)
Monocytes Relative: 13 %
Neutro Abs: 3328 cells/uL (ref 1500–7800)
Neutrophils Relative %: 64 %
Platelets: 251 10*3/uL (ref 140–400)
RBC: 3.24 MIL/uL — ABNORMAL LOW (ref 3.80–5.10)
RDW: 17.3 % — ABNORMAL HIGH (ref 11.0–15.0)
WBC: 5.2 10*3/uL (ref 3.8–10.8)

## 2017-04-06 MED ORDER — GLIMEPIRIDE 2 MG PO TABS: ORAL_TABLET | ORAL | 1 refills | Status: DC

## 2017-04-06 MED ORDER — TRIAMCINOLONE ACETONIDE 0.025 % EX CREA: 1.0000 "application " | TOPICAL_CREAM | Freq: Every day | CUTANEOUS | 0 refills | Status: DC | PRN

## 2017-04-06 MED ORDER — LEVOTHYROXINE SODIUM 50 MCG PO TABS: ORAL_TABLET | ORAL | 1 refills | Status: DC

## 2017-04-06 MED ORDER — FUROSEMIDE 40 MG PO TABS: 40.0000 mg | ORAL_TABLET | Freq: Every day | ORAL | 1 refills | Status: DC

## 2017-04-06 NOTE — Patient Instructions (Addendum)
Do not have to take blood SUGARS routinely- only when needed  Blood Pressure- take blood pressure 1 hour after medication, after you have been sitting for at least 5 mins-- do this twice a week.    Keep follow up, you will NOT need to be fasting for blood work in Aug

## 2017-04-06 NOTE — Progress Notes (Signed)
Careteam: Patient Care Team: Estill Dooms, MD as PCP - General (Internal Medicine) [name], MD as Consulting Physician (Orthopedic Surgery) Druscilla Brownie, MD as Consulting Physician (Dermatology) Ralene Bathe, MD as Consulting Physician (Ophthalmology)  Advanced Directive information Does Patient Have a Medical Advance Directive?: Yes, Type of Advance Directive: Healthcare Power of Attorney  Allergies  Allergen Reactions  . Codeine Other (See Comments)    "Spaces out" the patient and she cannot walk well  . Vesicare [Solifenacin] Itching    Chief Complaint  Patient presents with  . Follow-up    Pt is being seen for a follow up on kidney function, BP, and blood sugar.      HPI: Patient is a 81 y.o. female seen in the office today to follow up CKD, BP and blood sugar. Pt was hospitalized in May with GI bleed and had AKI therefore metformin and lisinopril was stopped.  Blood pressure- she has been taking 1/2 tablet of cardura (medication list states 1 tablet) she says she thinks cardiology cut her back at some point.  Blood pressures ranging from 124-167/48-64 Blood sugars- (off metformin, taking glimepiride 2 mg daily - ranging- 104-140 Would like hgb checked- no bloody stools, needs hgb to be over 8.4 to go back to cardiopulmonary rehab.  Review of Systems:  Review of Systems  Constitutional: Negative for chills, fever, malaise/fatigue and weight loss.  HENT: Negative for tinnitus.   Respiratory: Positive for shortness of breath (hx of asthma, chronic and stable). Negative for cough and sputum production.   Cardiovascular: Negative for chest pain, palpitations and leg swelling.  Gastrointestinal: Negative for abdominal pain, constipation, diarrhea and heartburn.       Dark stools  Genitourinary: Negative for dysuria, frequency and urgency.  Musculoskeletal: Negative for back pain, falls, joint pain and myalgias.  Skin: Negative.   Neurological: Negative  for dizziness, weakness and headaches.  Endo/Heme/Allergies: Positive for environmental allergies.  Psychiatric/Behavioral: Negative for depression and memory loss. The patient does not have insomnia.     Past Medical History:  Diagnosis Date  . Allergy   . Anemia, unspecified   . Arthritis   . Asthma   . Atrial fibrillation status post cardioversion [facility] [place]) 09/2015   s/p TEE/DCCV>>SR on amio  . Benign neoplasm of colon   . Carpal tunnel syndrome   . Cellulitis and abscess of finger, unspecified   . Cerumen impaction   . Cervicalgia   . CHF (congestive heart failure) ([place])   . Chronic airway obstruction, not elsewhere classified   . Chronic anticoagulation 09/2015   Xarelto for afib, CHADS2VASC=5  . Diarrhea   . Diffuse cystic mastopathy   . Dysrhythmia   . Edema   . Encounter for long-term (current) use of other medications   . GERD (gastroesophageal reflux disease)   . Heart murmur   . Lumbago   . Neck pain 05/23/2015  . Obesity, unspecified   . Other and unspecified hyperlipidemia   . Other psoriasis   . Pain in joint, lower leg   . PONV (postoperative nausea and vomiting)   . Primary localized osteoarthrosis of right shoulder 12/18/2015  . Routine gynecological examination   . 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 Scoliosis (and kyphoscoliosis), idiopathic   . Severe aortic stenosis   . Shortness of breath dyspnea    with exertion  . Type II or unspecified  type diabetes mellitus without mention of complication, uncontrolled   . Unspecified essential hypertension   . Unspecified hemorrhoids without mention of complication   . Unspecified hypothyroidism   . Urge incontinence    Past Surgical History:  Procedure Laterality Date  . ABDOMINAL HYSTERECTOMY  1987   complete  . BREAST SURGERY     right breast 3 surgeries [phone]  .  CARDIAC CATHETERIZATION N/A 09/02/2016   Procedure: Right/Left Heart Cath and Coronary Angiography;  Surgeon: [name] M Martinique, MD;  Location: [place] [facility];  Service: Cardiovascular;  Laterality: N/A;  . CARDIOVERSION N/A 10/19/2015   Procedure: CARDIOVERSION;  Surgeon: Lelon Perla, MD;  Location: [place];  Service: Cardiovascular;  Laterality: N/A;  . [place]  . COLON SURGERY     2004,2007,2013.  3 times colon surgieres  . ESOPHAGOGASTRODUODENOSCOPY (EGD) WITH PROPOFOL Left 03/13/2017   Procedure: ESOPHAGOGASTRODUODENOSCOPY (EGD) WITH PROPOFOL;  Surgeon: Ronnette Juniper, MD;  Location: [place];  Service: Gastroenterology;  Laterality: Left;  . EXCISE LE MANDIBULAR LYMPH NODE T     DR C. NEWMAN  . FOOT SURGERY  1989  . LEFT SHOULDER ARTHROSCOPY    . 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 NEUROPLASTY / TRANSPOSITION MEDIAN NERVE AT CARPAL TUNNEL BILATERAL  05/2003  . TEE WITHOUT CARDIOVERSION N/A 10/19/2015   Procedure: Transesophageal Echocardiogram (TEE) ;  Surgeon: Lelon Perla, MD;  Location: [facility] ENDOSCOPY;  Service: Cardiovascular;  Laterality: N/A;  . TEE WITHOUT CARDIOVERSION N/A 10/14/2016   Procedure: TRANSESOPHAGEAL ECHOCARDIOGRAM (TEE);  Surgeon: Sherren Mocha, MD;  Location: [place];  Service: Open Heart Surgery;  Laterality: N/A;  . TOTAL SHOULDER ARTHROPLASTY Right 12/18/2015   Procedure: TOTAL SHOULDER ARTHROPLASTY;  Surgeon: [name], MD;  Location: [place];  Service: Orthopedics;  Laterality: Right;  . TRANSCATHETER AORTIC VALVE REPLACEMENT, TRANSFEMORAL N/A 10/14/2016   Procedure: TRANSCATHETER AORTIC VALVE REPLACEMENT, TRANSFEMORAL;  Surgeon: Sherren Mocha, MD;  Location: [place];  Service: Open Heart Surgery;  Laterality: N/A;  . TRIGGER FINGER RELEASE Left   . VAGINAL CYST REMOVED  1967   Social History:   reports that she quit smoking about 27 years ago. She has a 40.00 pack-year smoking history. She has never used smokeless  tobacco. She reports that she does not drink alcohol or use drugs.  Family History  Problem Relation Age of Onset  . Diabetes Father   . Stroke Father   . Heart disease Father   . Liver cancer Sister   . Heart disease Brother   . Asthma Sister   . Arthritis Sister     Medications: Patient's Medications  New Prescriptions   No medications on file  Previous Medications   ACETAMINOPHEN (TYLENOL) 500 MG TABLET    Take 500 mg by mouth every 6 (six) hours as needed for mild pain.   ALBUTEROL (PROVENTIL HFA;VENTOLIN HFA) 108 (90 BASE) MCG/ACT INHALER    Inhale 2 puffs into the lungs every 6 (six) hours as needed for wheezing or shortness of breath.   ALBUTEROL (PROVENTIL) (2.5 MG/3ML) 0.083% NEBULIZER SOLUTION    Take 3 mLs (2.5 mg total) by nebulization every 4 (four) hours as needed for wheezing or shortness of breath.   AMIODARONE (PACERONE) 200 MG TABLET    Take one tablet daily by mouth   APIXABAN (ELIQUIS) 5 MG TABS TABLET    Take 1 tablet (5 mg total) by mouth 2 (two) times daily.   CETIRIZINE (ZYRTEC) 10 MG TABLET  Take 10 mg by mouth daily as needed for allergies.    CHOLECALCIFEROL (VITAMIN D) 1000 UNITS TABLET    Take 1,000 Units by mouth daily.   COMFORT LANCETS MISC    Use daily to get blood for glucometer   DOXAZOSIN (CARDURA) 2 MG TABLET    Take 1 tablet (2 mg total) by mouth daily.   FERROUS SULFATE 325 (65 FE) MG EC TABLET    Take 325 mg by mouth 2 (two) times daily.   FLUTICASONE FUROATE-VILANTEROL (BREO ELLIPTA) 100-25 MCG/INH AEPB    Inhale into the lungs once daily   FUROSEMIDE (LASIX) 40 MG TABLET    Take 1 tablet (40 mg total) by mouth daily.   GLIMEPIRIDE (AMARYL) 2 MG TABLET    TAKE 1 TABLET BY MOUTH AT  BREAKFAST TO CONTROL  GLUCOSE   GLUCOSE BLOOD (BAYER CONTOUR TEST) TEST STRIP    Use daily too check glucose Dx: E11.9   KETOTIFEN FUMARATE (ALLERGY EYE DROPS OP)    Place 1 drop into both eyes 3 (three) times daily as needed (allergies).    LEVOTHYROXINE  (SYNTHROID, LEVOTHROID) 50 MCG TABLET    Take 1 tablet by mouth  daily for thyroid  supplement   MIRABEGRON ER (MYRBETRIQ) 25 MG TB24 TABLET    One daily to help bladder control   PANTOPRAZOLE (PROTONIX) 40 MG TABLET    Take 1 tablet (40 mg total) by mouth 2 (two) times daily.   POTASSIUM GLUCONATE 595 (99 K) MG TABS TABLET    Take 595 mg by mouth daily. TAKES ONLY WHEN USING FUROSEMIDE   PRAVASTATIN (PRAVACHOL) 20 MG TABLET    TAKE 1 TABLET BY MOUTH  DAILY   TRIAMCINOLONE (KENALOG) 0.025 % CREAM    Apply 1 application topically daily as needed (psoriasis).   Modified Medications   No medications on file  Discontinued Medications   ADVAIR DISKUS 100-50 MCG/DOSE AEPB    Inhale 1 inhalation two  times daily for breathing     Physical Exam:  Vitals:   04/06/17 0835  BP: (!) 148/84  Pulse: 66  Resp: 17  Temp: 97.5 F (36.4 C)  TempSrc: Oral  SpO2: 95%  Weight: 215 lb 12.8 oz (97.9 kg)  Height: _0  (1.6 m)   Body mass index is 38.23 kg/m.  Physical Exam  Constitutional: She is oriented to person, place, and time. No distress.  Morbid obesity  HENT:  Head: Normocephalic and atraumatic.  Nose: Nose normal.  Mouth/Throat: Oropharynx is clear and moist.  Eyes: Conjunctivae and EOM are normal.  Neck: Normal range of motion. Neck supple. No JVD present. No tracheal deviation present. No thyromegaly present.  Cardiovascular: Normal rate and regular rhythm.  Exam reveals no gallop and no friction rub.   Murmur (2/6 SEM at the aortic area and LSB) heard. 3/6 aortic ejection murmur  Pulmonary/Chest: No respiratory distress. She has no wheezes. She exhibits no tenderness.  Abdominal: Bowel sounds are normal. She exhibits no distension and no mass. There is no tenderness.  Musculoskeletal: Normal range of motion. She exhibits no edema or tenderness.  Lymphadenopathy:    She has no cervical adenopathy.  Neurological: She is alert and oriented to person, place, and time. She has normal  reflexes. No cranial nerve deficit. Coordination normal.  Skin: No rash noted. No erythema. No pallor.  Psychiatric: She has a normal mood and affect. Judgment and thought content normal.   Labs reviewed: Basic Metabolic Panel:  Recent Labs  04/07/16  1037 04/28/16 0940  09/12/16 0023  10/15/16 0400  03/13/17 0628 03/16/17 0930 03/26/17 0846  NA 137 139  < >  --   < > 138  < > 140 139 140  K 4.3 4.2  < >  --   < > 4.6  < > 4.7 4.4 4.6  CL 99 95*  < >  --   < > 101  < > 110 106 100  CO2 27 27  < >  --   < > 30  < > _0 GLUCOSE 98 119*  < >  --   < > 118*  < > 112* 104* 151*  BUN 32* 27  < >  --   < > 23*  < > 19 21 26*  CREATININE 1.27* 1.27*  < >  --   < > 1.24*  < > 1.45* 1.57* 1.50*  CALCIUM 8.9 8.8  < >  --   < > 8.8*  < > 8.5* 8.7 8.7  MG  --   --   --   --   --  1.9  --   --   --   --   TSH 4.09 6.030*  --  1.297  --   --   --   --   --   --   < > = values in this interval not displayed. Liver Function Tests:  Recent Labs  12/23/16 0935 02/18/17 1043 03/11/17 1448  AST 20 32 30  ALT 21 42* 34  ALKPHOS 58 65 52  BILITOT 0.4 0.2 0.4  PROT 6.9 7.0 6.6  ALBUMIN 3.6 4.0 3.5    Recent Labs  12/23/16 0935  LIPASE 34  AMYLASE 38   No results for input(s): AMMONIA in the last 8760 hours. CBC:  Recent Labs  12/23/16 0935 01/20/17 1532  03/13/17 0628 03/13/17 1227 03/16/17 0930  WBC 5.7 6.0  < > 5.3 5.1 5.0  NEUTROABS 3,876 3,960  --   --   --  3,450  HGB 7.4* 9.5*  < > 7.7* 7.6* 8.3*  HCT 24.0* 30.3*  < > 23.9* 24.7* 26.0*  MCV 86.6 88.1  < > 87.2 89.2 88.7  PLT 261 284  < > 194 201 255  < > = values in this interval not displayed. Lipid Panel:  Recent Labs  04/28/16 0940  CHOL 182  HDL 61  LDLCALC 86  TRIG 174*  CHOLHDL 3.0   TSH:  Recent Labs  04/07/16 1037 04/28/16 0940 09/12/16 0023  TSH 4.09 6.030* 1.297   A1C: Lab Results  Component Value Date   HGBA1C 6.8 (H) 10/10/2016     Assessment/Plan 1. Type 2 diabetes mellitus  without complication, without long-term current use of insulin (HCC) Blood sugars at goal on glimepiride. To cont medication and dietary modifications.  - glimepiride (AMARYL) 2 MG tablet; TAKE 1 TABLET BY MOUTH AT  BREAKFAST TO CONTROL  GLUCOSE  Dispense: 30 tablet; Refill: 1 - Hemoglobin A1c  2. Essential hypertension Not at goal, only taking 1/2 tablet, will have her take doxazosin 2 mg 1 tablet daily. Cont dietary modifications.   3. Chronic diastolic CHF (congestive heart failure) (HCC) Stable, following with cardiology  - furosemide (LASIX) 40 MG tablet; Take 1 tablet (40 mg total) by mouth daily.  Dispense: 90 tablet; Refill: 1  4. CKD (chronic kidney disease) stage 3, GFR 30-59 ml/min To avoid nephrotoxic medication and dehydration. Will follow up kidney function prior  to next visit.   5. Anemia, unspecified type conts on iron twice daily, no further bleeding noted - CBC with Differential/Platelets  6. Mixed hyperlipidemia On pravastatin daily  - Lipid Panel  To keep follow up in August  Klyde Banka K. Harle Battiest  [facility] [phone] 8 am - 5 pm) [phone] (after hours)

## 2017-04-07 ENCOUNTER — Telehealth: Payer: Self-pay

## 2017-04-07 LAB — HEMOGLOBIN A1C
Hgb A1c MFr Bld: 5.8 % — ABNORMAL HIGH (ref <5.7)
Mean Plasma Glucose: 120 mg/dL

## 2017-04-07 MED ORDER — DOXAZOSIN MESYLATE 4 MG PO TABS: 4.0000 mg | ORAL_TABLET | Freq: Every day | ORAL | 0 refills | Status: DC

## 2017-04-07 NOTE — Telephone Encounter (Signed)
Patient stated that she needed to clarify the dosage of doxazosin she is taking.   Patient stated that she is actually taking a whole 2 mg tablet and not the half a tablet that she reported while in office yesterday.   Patient would like to know if she needs to increase the dosage from 18m to 4 mg?   Please advise.

## 2017-04-07 NOTE — Telephone Encounter (Signed)
Yes have her take 4 mg daily, to check blood pressure as directed

## 2017-04-07 NOTE — Telephone Encounter (Signed)
Patient notified and agreed. Medication list updated.

## 2017-04-17 DIAGNOSIS — E113293 Type 2 diabetes mellitus with mild nonproliferative diabetic retinopathy without macular edema, bilateral: Secondary | ICD-10-CM | POA: Diagnosis not present

## 2017-04-17 DIAGNOSIS — H2513 Age-related nuclear cataract, bilateral: Secondary | ICD-10-CM | POA: Diagnosis not present

## 2017-04-17 DIAGNOSIS — H5203 Hypermetropia, bilateral: Secondary | ICD-10-CM | POA: Diagnosis not present

## 2017-04-17 DIAGNOSIS — H524 Presbyopia: Secondary | ICD-10-CM | POA: Diagnosis not present

## 2017-04-17 DIAGNOSIS — H35033 Hypertensive retinopathy, bilateral: Secondary | ICD-10-CM | POA: Diagnosis not present

## 2017-04-17 DIAGNOSIS — H52223 Regular astigmatism, bilateral: Secondary | ICD-10-CM | POA: Diagnosis not present

## 2017-04-20 ENCOUNTER — Other Ambulatory Visit: Payer: Self-pay | Admitting: Nurse Practitioner

## 2017-04-20 DIAGNOSIS — R195 Other fecal abnormalities: Secondary | ICD-10-CM

## 2017-05-22 ENCOUNTER — Encounter: Payer: Self-pay | Admitting: *Deleted

## 2017-05-28 ENCOUNTER — Other Ambulatory Visit: Payer: Self-pay

## 2017-05-28 DIAGNOSIS — E039 Hypothyroidism, unspecified: Secondary | ICD-10-CM

## 2017-05-28 DIAGNOSIS — E7849 Other hyperlipidemia: Secondary | ICD-10-CM

## 2017-05-28 DIAGNOSIS — E119 Type 2 diabetes mellitus without complications: Secondary | ICD-10-CM

## 2017-05-28 DIAGNOSIS — I10 Essential (primary) hypertension: Secondary | ICD-10-CM

## 2017-06-08 NOTE — Progress Notes (Signed)
Cardiology Office Note   Date:  06/09/2017   ID:  Patricia [name], DOB 08/25/1936, MRN [id number]  PCP:  [name], NP  Cardiologist:   Lina Hitch Martinique, MD   Chief Complaint  Patient presents with  . Atrial Fibrillation  . Aortic Stenosis      History of Present Illness: Patricia [name] is a 81 y.o. female who presents for follow up atrial fibrillation and  aortic stenosis. She has a history of HTN, obesity, DM, HL, and anemia. She was admitted to the hospital in December 2016 with increased dyspnea. She was found to be in atrial fibrillation with RVR. She had pulmonary edema. Hgb was 10. Echo showed EF 55-60% with moderate to severe AS. She was anticoagulated with Xarelto and started on amiodarone. She had TEE guided DCCV with return to NSR. She was seen back in the office on 11/20/15 and was still in NSR.  On 12/18/15 she underwent right shoulder arthroplasty. Post op Hgb was 9.6. It subsequently dropped to 7.1. She was transfused. She has been on iron. Hgb improved to 10. She did have EGD and colonoscopy with both gastric and colonic polyps. No bleeding seen.   On follow up in October she noted symptoms of increased dyspnea.  She  Underwent cardiac cath showing severe AS with single vessel obstructive CAD with 75% OM3 lesion. Mild pulmonary HTN and elevated filling pressures. She was admitted in November with acute CHF and was diuresed. She was evaluated in valve clinic and underwent transfemoral TAVR on 10/14/16. Post op course was uncomplicated. Repeat Echo in January  Showed higher than expected AV prosthesis gradient felt to be related to vigorous LV systolic function and Valve/ patient mismatch. She has been on  Eliquis. She has noted moderate improvement in her breathing.   She continues to have issues with COPD and bronchitis- followed by primary care and pulmonary.  In March she was noted to have progressive anemia with dark stools. Hgb down to 7.4. She was transfused and  EGD showed multiple gastric polyps and some oozing AVMs that were cauterized. Once again she was admitted in May with worsening anemia. 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 Transfused 1 units PRBCs. Plan for outpatient capsule endoscopy.   On follow up today she notes no further bleeding since May. Is scheduled for repeat lab work next week. Denies any palpitations or dizziness. Sometimes has a hard time breathing when it is hot and humid. She is on iron therapy. Lisinopril was stopped in hospital due to renal function.    Past Medical History:  Diagnosis Date  . Allergy   . Anemia, unspecified   . Arthritis   . Asthma   . Atrial fibrillation status post cardioversion [facility]) 09/2015   s/p TEE/DCCV>>SR on amio  . Benign neoplasm of colon   . Carpal tunnel syndrome   . Cellulitis and abscess of finger, unspecified   . Cerumen impaction   . Cervicalgia   . CHF (congestive heart failure) ([place])   . Chronic airway obstruction, not elsewhere classified   . Chronic anticoagulation 09/2015   Xarelto for afib, CHADS2VASC=5  . Diarrhea   . Diffuse cystic mastopathy   . Dysrhythmia   . Edema   . Encounter for long-term (current) use of other medications   . GERD (gastroesophageal reflux disease)   . Heart murmur   . Lumbago   . Neck pain 05/23/2015  . Obesity, unspecified   . Other  and unspecified hyperlipidemia   . Other psoriasis   . Pain in joint, lower leg   . PONV (postoperative nausea and vomiting)   . Primary localized osteoarthrosis of right shoulder 12/18/2015  . Routine gynecological examination   . 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 Scoliosis (and kyphoscoliosis), idiopathic   . Severe aortic stenosis   . Shortness of breath dyspnea    with exertion  . Type II or unspecified type diabetes mellitus without mention of  complication, uncontrolled   . Unspecified essential hypertension   . Unspecified hemorrhoids without mention of complication   . Unspecified hypothyroidism   . Urge incontinence     Past Surgical History:  Procedure Laterality Date  . ABDOMINAL HYSTERECTOMY  1987   complete  . BREAST SURGERY     right breast 3 surgeries [phone]  . CARDIAC CATHETERIZATION N/A 09/02/2016   Procedure: Right/Left Heart Cath and Coronary Angiography;  Surgeon: Kerem Gilmer M Martinique, MD;  Location: [facility];  Service: Cardiovascular;  Laterality: N/A;  . CARDIOVERSION N/A 10/19/2015   Procedure: CARDIOVERSION;  Surgeon: Lelon Perla, MD;  Location: [place] [facility];  Service: Cardiovascular;  Laterality: N/A;  . [place]  . COLON SURGERY     2004,2007,2013.  3 times colon surgieres  . ESOPHAGOGASTRODUODENOSCOPY (EGD) WITH PROPOFOL Left 03/13/2017   Procedure: ESOPHAGOGASTRODUODENOSCOPY (EGD) WITH PROPOFOL;  Surgeon: Ronnette Juniper, MD;  Location: [place];  Service: Gastroenterology;  Laterality: Left;  . EXCISE LE MANDIBULAR LYMPH NODE T     DR C. NEWMAN  . FOOT SURGERY  1989  . LEFT SHOULDER ARTHROSCOPY    . 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 NEUROPLASTY / TRANSPOSITION MEDIAN NERVE AT CARPAL TUNNEL BILATERAL  05/2003  . TEE WITHOUT CARDIOVERSION N/A 10/19/2015   Procedure: Transesophageal Echocardiogram (TEE) ;  Surgeon: Lelon Perla, MD;  Location: [facility];  Service: Cardiovascular;  Laterality: N/A;  . TEE WITHOUT CARDIOVERSION N/A 10/14/2016   Procedure: TRANSESOPHAGEAL ECHOCARDIOGRAM (TEE);  Surgeon: Sherren Mocha, MD;  Location: [place];  Service: Open Heart Surgery;  Laterality: N/A;  . TOTAL SHOULDER ARTHROPLASTY Right 12/18/2015   Procedure: TOTAL SHOULDER ARTHROPLASTY;  Surgeon: [name], MD;  Location: [place];  Service: Orthopedics;  Laterality: Right;  . TRANSCATHETER AORTIC VALVE REPLACEMENT, TRANSFEMORAL N/A 10/14/2016   Procedure:  TRANSCATHETER AORTIC VALVE REPLACEMENT, TRANSFEMORAL;  Surgeon: Sherren Mocha, MD;  Location: [place];  Service: Open Heart Surgery;  Laterality: N/A;  . TRIGGER FINGER RELEASE Left   . VAGINAL CYST REMOVED  1967     Current Outpatient Prescriptions  Medication Sig Dispense Refill  . acetaminophen (TYLENOL) 500 MG tablet Take 500 mg by mouth every 6 (six) hours as needed for mild pain.    Marland Kitchen albuterol (PROVENTIL HFA;VENTOLIN HFA) 108 (90 Base) MCG/ACT inhaler Inhale 2 puffs into the lungs every 6 (six) hours as needed for wheezing or shortness of breath. 1 Inhaler 11  . albuterol (PROVENTIL) (2.5 MG/3ML) 0.083% nebulizer solution Take 3 mLs (2.5 mg total) by nebulization every 4 (four) hours as needed for wheezing or shortness of breath.    Marland Kitchen amiodarone (PACERONE) 200 MG tablet Take 0.5 tablets (100 mg total) by mouth daily. Take one tablet daily by mouth 90 tablet 3  . apixaban (ELIQUIS) 5 MG TABS tablet Take 1 tablet (5 mg total) by mouth 2 (two) times daily.  180 tablet 1  . cetirizine (ZYRTEC) 10 MG tablet Take 10 mg by mouth daily as needed for allergies.     . cholecalciferol (VITAMIN D) 1000 units tablet Take 1,000 Units by mouth daily.    . COMFORT LANCETS MISC Use daily to get blood for glucometer 100 each 3  . doxazosin (CARDURA) 4 MG tablet Take 1 tablet (4 mg total) by mouth daily. 30 tablet 0  . ferrous sulfate 325 (65 FE) MG EC tablet Take 325 mg by mouth 2 (two) times daily.    . fluticasone furoate-vilanterol (BREO ELLIPTA) 100-25 MCG/INH AEPB Inhale into the lungs once daily 1 each 4  . furosemide (LASIX) 40 MG tablet Take 1 tablet (40 mg total) by mouth daily. 90 tablet 1  . glimepiride (AMARYL) 2 MG tablet TAKE 1 TABLET BY MOUTH AT  BREAKFAST TO CONTROL  GLUCOSE 30 tablet 1  . glucose blood (BAYER CONTOUR TEST) test strip Use daily too check glucose Dx: E11.9 100 each 4  . Ketotifen Fumarate (ALLERGY EYE DROPS OP) Place 1 drop into both eyes 3 (three) times daily as needed  (allergies).     Marland Kitchen levothyroxine (SYNTHROID, LEVOTHROID) 50 MCG tablet Take 1 tablet by mouth  daily for thyroid  supplement 30 tablet 1  . mirabegron ER (MYRBETRIQ) 25 MG TB24 tablet One daily to help bladder control 90 tablet 5  . pantoprazole (PROTONIX) 40 MG tablet Take 1 tablet (40 mg total) by mouth 2 (two) times daily. 30 tablet 3  . pantoprazole (PROTONIX) 40 MG tablet TAKE 1 TABLET BY MOUTH TWO  TIMES DAILY 180 tablet 1  . potassium gluconate 595 (99 K) MG TABS tablet Take 595 mg by mouth daily. TAKES ONLY WHEN USING FUROSEMIDE    . pravastatin (PRAVACHOL) 20 MG tablet TAKE 1 TABLET BY MOUTH  DAILY 90 tablet 1  . triamcinolone (KENALOG) 0.025 % cream Apply 1 application topically daily as needed (psoriasis). 30 g 0   No current facility-administered medications for this visit.     Allergies:   Codeine and Vesicare [solifenacin]    Social History:  The patient  reports that she quit smoking about 27 years ago. She has a 40.00 pack-year smoking history. She has never used smokeless tobacco. She reports that she does not drink alcohol or use drugs.   Family History:  The patient's family history includes Arthritis in her sister; Asthma in her sister; Diabetes in her father; Heart disease in her brother and father; Liver cancer in her sister; Stroke in her father.    ROS:  Please see the history of present illness.   Otherwise, review of systems are positive for none.   All other systems are reviewed and negative.    PHYSICAL EXAM: VS:  BP 130/72   Pulse 68   Ht _0  (1.6 m)   Wt 216 lb (98 kg)   BMI 38.26 kg/m  , BMI Body mass index is 38.26 kg/m. GEN: Well nourished, obese, in no acute distress  HEENT: normal  Neck: no JVD, carotid bruits, or masses Cardiac: RRR; harsh gr 2/6 systolic murmur RUSB.  No gallop  Respiratory:  clear to auscultation bilaterally, scant wheeze GI: soft, nontender, nondistended, + BS MS: no deformity or atrophy  Skin: warm and dry, no rash. no  LE edema.  Neuro:  Strength and sensation are intact Psych: euthymic mood, full affect   EKG:  EKG is not ordered today.  Recent Labs: Lab Results  Component Value Date  WBC 5.2 04/06/2017   HGB 9.1 (L) 04/06/2017   HCT 29.2 (L) 04/06/2017   PLT 251 04/06/2017   GLUCOSE 151 (H) 03/26/2017   CHOL 182 04/28/2016   TRIG 174 (H) 04/28/2016   HDL 61 04/28/2016   LDLCALC 86 04/28/2016   ALT 34 03/11/2017   AST 30 03/11/2017   NA 140 03/26/2017   K 4.6 03/26/2017   CL 100 03/26/2017   CREATININE 1.50 (H) 03/26/2017   BUN 26 (H) 03/26/2017   CO2 28 03/26/2017   TSH 1.297 09/12/2016   INR 1.29 03/11/2017   HGBA1C 5.8 (H) 04/06/2017      Lipid Panel    Component Value Date/Time   CHOL 182 04/28/2016 0940   TRIG 174 (H) 04/28/2016 0940   HDL 61 04/28/2016 0940   CHOLHDL 3.0 04/28/2016 0940   LDLCALC 86 04/28/2016 0940      Wt Readings from Last 3 Encounters:  06/09/17 216 lb (98 kg)  04/06/17 215 lb 12.8 oz (97.9 kg)  03/16/17 211 lb 6.4 oz (95.9 kg)      Other studies Reviewed: Additional studies/ records that were reviewed today include: Echo 04/30/16:Study Conclusions  - Left ventricle: The cavity size was normal. There was moderate   concentric hypertrophy. Systolic function was normal. The   estimated ejection fraction was in the range of 55% to 60%. Wall   motion was normal; there were no regional wall motion   abnormalities. Features are consistent with a pseudonormal left   ventricular filling pattern, with concomitant abnormal relaxation   and increased filling pressure (grade 2 diastolic dysfunction). - Aortic valve: There was moderate to severe stenosis. Mean   gradient (S): 40 mm Hg. Peak gradient (S): 65 mm Hg. Valve area   (VTI): 1.07 cm^2. Valve area (Vmax): 1 cm^2. Valve area (Vmean):   0.94 cm^2. - Mitral valve: Calcified annulus. Mildly thickened leaflets .   There was mild regurgitation. Valve area by continuity equation   (using LVOT flow):  1.88 cm^2. - Left atrium: The atrium was moderately to severely dilated. - Pulmonary arteries: Systolic pressure was mildly increased. PA   peak pressure: 39 mm Hg (S).  Procedures   Right/Left Heart Cath and Coronary Angiography  Conclusion     LV end diastolic pressure is moderately elevated.  There is severe aortic valve stenosis.  There is no mitral valve stenosis.  Prox LAD to Mid LAD lesion, 20 %stenosed.  Ost 3rd Mrg to 3rd Mrg lesion, 75 %stenosed.  Ost 2nd Mrg to 2nd Mrg lesion, 30 %stenosed.  Ost RCA to Dist RCA lesion, 10 %stenosed.  Hemodynamic findings consistent with mild pulmonary hypertension.   1. Single vessel obstructive CAD involving OM3 2. Moderate to severe aortic stenosis. Mean gradient 41 mm Hg, valve index 0.7 3. Mild pulmonary HTN 4. Elevated LV filling pressures. Prominent V waves on PCWP tracings c/w decreased LV compliance.  5. Normal cardiac output.   Plan: recommend consideration for AVR/ single vessel CABG.     Echo 11/24/16: Study Conclusions  - Left ventricle: The cavity size was normal. Wall thickness was   increased in a pattern of mild LVH. There was mild focal basal   hypertrophy of the septum. Systolic function was vigorous. The   estimated ejection fraction was in the range of 65% to 70%. Wall   motion was normal; there were no regional wall motion   abnormalities. Features are consistent with a pseudonormal left   ventricular filling pattern, with concomitant  abnormal relaxation   and increased filling pressure (grade 2 diastolic dysfunction).   Doppler parameters are consistent with high ventricular filling   pressure. - Aortic valve: A bioprosthesis was present. - Mitral valve: Severely calcified annulus. Mildly thickened   leaflets . There was mild regurgitation. - Left atrium: The atrium was severely dilated. - Right atrium: The atrium was mildly dilated. - Pulmonary arteries: Systolic pressure was moderately  increased.   PA peak pressure: 46 mm Hg (S).  Impressions:  - Vigorous LV systolic function; grade 2 diastolic dysfunction;   elevated LV filling pressure; mild LVH; s/p TAVR with elevated   mean gradient (29 mmHg); no AI; mild MR; severe LAE; mild RAE;   mild TR with moderately elevated pulmonary pressure.   ASSESSMENT AND PLAN:  1.  Atrial fibrillation. S/p DCCV in December 2016. Maintaining NSR on amiodarone.  Currently on Eliquis. Thyroid studies monitored by primary care. Since she has no recurrent Afib I have recommended reducing amiodarone to 100 mg daily to minimize potential side effects. We discussed the reason for continuing Eliquis to reduce risk of stroke. If she continues to have significant bleeding episodes we may have to consider stopping it.   2.  Severe Aortic stenosis. Echo in July showed progression with velocity > 4 cm/sec and mean gradient of 40 mm Hg. Now s/p TAVR in December 2017. Follow up Echo with higher than expected gradient (mean gradient of 29 mm Hg) due to patient/ prosthetic mismatch with small annuls size and vigorous LV function. Repeat Echo at one year.   3. Anemia Fe deficient with GI blood loss secondary to AVMS.  On Eliquis. With AS not surprising that she was found to have AVMs. Also gastric polyps. Repeat Hgb next week.  4. Chronic kidney disease stage 3.   5. S/p right shoulder arthroplasty.   6. Chronic diastolic CHF. Not currently volume overloaded.  7. HTN- BP is controlled.  8. DM type 2.   9. COPD.     Current medicines are reviewed at length with the patient today.  The patient does not have concerns regarding medicines.  The following changes have been made:  See above  Labs/ tests ordered today include:   No orders of the defined types were placed in this encounter.    Disposition:  Follow up in 6 months.   Signed, Carlisle Enke Martinique, MD  06/09/2017 11:08 AM    [facility] [address]  [place], [place], [address] Phone [phone], Fax [phone]

## 2017-06-09 ENCOUNTER — Encounter: Payer: Self-pay | Admitting: Cardiology

## 2017-06-09 ENCOUNTER — Ambulatory Visit (INDEPENDENT_AMBULATORY_CARE_PROVIDER_SITE_OTHER): Payer: Medicare Other | Admitting: Cardiology

## 2017-06-09 VITALS — BP 130/72 | HR 68 | Ht 63.0 in | Wt 216.0 lb

## 2017-06-09 DIAGNOSIS — I350 Nonrheumatic aortic (valve) stenosis: Secondary | ICD-10-CM

## 2017-06-09 DIAGNOSIS — I480 Paroxysmal atrial fibrillation: Secondary | ICD-10-CM | POA: Diagnosis not present

## 2017-06-09 DIAGNOSIS — I10 Essential (primary) hypertension: Secondary | ICD-10-CM | POA: Diagnosis not present

## 2017-06-09 DIAGNOSIS — Z952 Presence of prosthetic heart valve: Secondary | ICD-10-CM | POA: Diagnosis not present

## 2017-06-09 DIAGNOSIS — I5032 Chronic diastolic (congestive) heart failure: Secondary | ICD-10-CM | POA: Diagnosis not present

## 2017-06-09 MED ORDER — AMIODARONE HCL 200 MG PO TABS: 100.0000 mg | ORAL_TABLET | Freq: Every day | ORAL | 3 refills | Status: DC

## 2017-06-09 NOTE — Patient Instructions (Signed)
Reduce amiodarone to 100 mg daily  Continue your other therapy  I will see you in 6 months.

## 2017-06-11 ENCOUNTER — Other Ambulatory Visit: Payer: Medicare Other

## 2017-06-11 DIAGNOSIS — E7849 Other hyperlipidemia: Secondary | ICD-10-CM

## 2017-06-11 DIAGNOSIS — E119 Type 2 diabetes mellitus without complications: Secondary | ICD-10-CM | POA: Diagnosis not present

## 2017-06-11 DIAGNOSIS — I10 Essential (primary) hypertension: Secondary | ICD-10-CM | POA: Diagnosis not present

## 2017-06-11 DIAGNOSIS — E784 Other hyperlipidemia: Secondary | ICD-10-CM | POA: Diagnosis not present

## 2017-06-11 DIAGNOSIS — E782 Mixed hyperlipidemia: Secondary | ICD-10-CM

## 2017-06-11 DIAGNOSIS — E039 Hypothyroidism, unspecified: Secondary | ICD-10-CM

## 2017-06-11 LAB — TSH: TSH: 8.98 mIU/L — ABNORMAL HIGH

## 2017-06-12 LAB — LIPID PANEL
Cholesterol: 158 mg/dL (ref <200)
HDL: 63 mg/dL (ref >50)
LDL Cholesterol: 76 mg/dL (ref <100)
Total CHOL/HDL Ratio: 2.5 Ratio (ref <5.0)
Triglycerides: 97 mg/dL (ref <150)
VLDL: 19 mg/dL (ref <30)

## 2017-06-12 LAB — COMPLETE METABOLIC PANEL WITH GFR
ALT: 20 U/L (ref 6–29)
AST: 17 U/L (ref 10–35)
Albumin: 3.8 g/dL (ref 3.6–5.1)
Alkaline Phosphatase: 56 U/L (ref 33–130)
BUN: 30 mg/dL — ABNORMAL HIGH (ref 7–25)
CO2: 25 mmol/L (ref 20–32)
Calcium: 9.2 mg/dL (ref 8.6–10.4)
Chloride: 101 mmol/L (ref 98–110)
Creat: 1.3 mg/dL — ABNORMAL HIGH (ref 0.60–0.93)
GFR, Est African American: 45 mL/min — ABNORMAL LOW (ref >=60)
GFR, Est Non African American: 39 mL/min — ABNORMAL LOW (ref >=60)
Glucose, Bld: 143 mg/dL — ABNORMAL HIGH (ref 65–99)
Potassium: 4.7 mmol/L (ref 3.5–5.3)
Sodium: 139 mmol/L (ref 135–146)
Total Bilirubin: 0.3 mg/dL (ref 0.2–1.2)
Total Protein: 6.6 g/dL (ref 6.1–8.1)

## 2017-06-12 LAB — MICROALBUMIN / CREATININE URINE RATIO
Creatinine, Urine: 127 mg/dL (ref 20–320)
Microalb Creat Ratio: 50 mcg/mg creat — ABNORMAL HIGH (ref <30)
Microalb, Ur: 6.3 mg/dL

## 2017-06-13 ENCOUNTER — Other Ambulatory Visit: Payer: Self-pay | Admitting: Nurse Practitioner

## 2017-06-13 DIAGNOSIS — E119 Type 2 diabetes mellitus without complications: Secondary | ICD-10-CM

## 2017-06-15 ENCOUNTER — Encounter: Payer: Self-pay | Admitting: Nurse Practitioner

## 2017-06-15 ENCOUNTER — Ambulatory Visit (INDEPENDENT_AMBULATORY_CARE_PROVIDER_SITE_OTHER): Payer: Medicare Other | Admitting: Nurse Practitioner

## 2017-06-15 ENCOUNTER — Other Ambulatory Visit: Payer: Self-pay | Admitting: *Deleted

## 2017-06-15 VITALS — BP 126/80 | HR 63 | Temp 97.8°F | Resp 17 | Ht 63.0 in | Wt 216.6 lb

## 2017-06-15 DIAGNOSIS — I10 Essential (primary) hypertension: Secondary | ICD-10-CM

## 2017-06-15 DIAGNOSIS — J449 Chronic obstructive pulmonary disease, unspecified: Secondary | ICD-10-CM

## 2017-06-15 DIAGNOSIS — N183 Chronic kidney disease, stage 3 unspecified: Secondary | ICD-10-CM

## 2017-06-15 DIAGNOSIS — Z23 Encounter for immunization: Secondary | ICD-10-CM | POA: Diagnosis not present

## 2017-06-15 DIAGNOSIS — K922 Gastrointestinal hemorrhage, unspecified: Secondary | ICD-10-CM | POA: Diagnosis not present

## 2017-06-15 DIAGNOSIS — E039 Hypothyroidism, unspecified: Secondary | ICD-10-CM

## 2017-06-15 DIAGNOSIS — I5032 Chronic diastolic (congestive) heart failure: Secondary | ICD-10-CM

## 2017-06-15 DIAGNOSIS — E119 Type 2 diabetes mellitus without complications: Secondary | ICD-10-CM | POA: Diagnosis not present

## 2017-06-15 DIAGNOSIS — I480 Paroxysmal atrial fibrillation: Secondary | ICD-10-CM | POA: Diagnosis not present

## 2017-06-15 DIAGNOSIS — D649 Anemia, unspecified: Secondary | ICD-10-CM | POA: Diagnosis not present

## 2017-06-15 MED ORDER — LISINOPRIL 5 MG PO TABS: 5.0000 mg | ORAL_TABLET | Freq: Every day | ORAL | 3 refills | Status: DC

## 2017-06-15 MED ORDER — FLUTICASONE FUROATE-VILANTEROL 100-25 MCG/INH IN AEPB: INHALATION_SPRAY | RESPIRATORY_TRACT | 4 refills | Status: DC

## 2017-06-15 MED ORDER — LEVOTHYROXINE SODIUM 75 MCG PO TABS: ORAL_TABLET | ORAL | Status: DC

## 2017-06-15 NOTE — Progress Notes (Signed)
Careteam: Patient Care Team: [name], NP as PCP - General (Geriatric Medicine) [name], MD as Consulting Physician (Orthopedic Surgery) Druscilla Brownie, MD as Consulting Physician (Dermatology) Ralene Bathe, MD as Consulting Physician (Ophthalmology)  Advanced Directive information Does Patient Have a Medical Advance Directive?: Yes, Type of Advance Directive: Healthcare Power of Attorney  Allergies  Allergen Reactions  . Codeine Other (See Comments)    "Spaces out" the patient and she cannot walk well  . Vesicare [Solifenacin] Itching    Chief Complaint  Patient presents with  . Medical Management of Chronic Issues    Pt is being seen for a 3 month routine visit. Pt reports occasional sharp pains in lower right abdomen. Discuss changing myrbetriq to oxybutin.      HPI: Patient is a 81 y.o. female seen in the office today for routine follow up.   Hypothyroid- TSH now 8.98, up from 1.297. Has not missed doses.  Just got a new Rx.   HTN- blood pressure controlled on cardura. Following with cardiology due to HTN, a fib, CHF.  conts on lasix daily. No increase in swelling Rate is controlled on amiodarone and using eliquis for anticoagulation.  No further bleeding noted.  Hx of GI bleed, will stop iron every now and then to check and see if her stool regular- which it is. conts on protonix BID with iron. Occasional left lower abdominal pain that last a few mins and goes away. Only happens occasionally. Has a lot of fluctuance.   Overactive bladder- in the doughnut hole and Rx running about 600$ a month. Doing well on myrbetriq but cost is an issue.   Review of Systems:  Review of Systems  Constitutional: Negative for chills, fever, malaise/fatigue and weight loss.  HENT: Negative for tinnitus.   Respiratory: Positive for shortness of breath (hx of asthma, chronic and stable). Negative for cough and sputum production.   Cardiovascular: Negative for  chest pain, palpitations and leg swelling.  Gastrointestinal: Negative for abdominal pain, constipation, diarrhea and heartburn.       Dark stools due to iron  Genitourinary: Negative for dysuria, frequency and urgency.  Musculoskeletal: Negative for back pain, falls, joint pain and myalgias.  Skin: Negative.   Neurological: Negative for dizziness, weakness and headaches.  Endo/Heme/Allergies: Positive for environmental allergies.  Psychiatric/Behavioral: Negative for depression and memory loss. The patient does not have insomnia.     Past Medical History:  Diagnosis Date  . Allergy   . Anemia, unspecified   . Arthritis   . Asthma   . Atrial fibrillation status post cardioversion [facility]) 09/2015   s/p TEE/DCCV>>SR on amio  . Benign neoplasm of colon   . Carpal tunnel syndrome   . Cellulitis and abscess of finger, unspecified   . Cerumen impaction   . Cervicalgia   . CHF (congestive heart failure) ([place])   . Chronic airway obstruction, not elsewhere classified   . Chronic anticoagulation 09/2015   Xarelto for afib, CHADS2VASC=5  . Diarrhea   . Diffuse cystic mastopathy   . Dysrhythmia   . Edema   . Encounter for long-term (current) use of other medications   . GERD (gastroesophageal reflux disease)   . Heart murmur   . Lumbago   . Neck pain 05/23/2015  . Obesity, unspecified   . Other and unspecified hyperlipidemia   . Other psoriasis   . Pain in joint, lower leg   . PONV (postoperative nausea and vomiting)   . Primary localized  osteoarthrosis of right shoulder 12/18/2015  . Routine gynecological examination   . 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 Scoliosis (and kyphoscoliosis), idiopathic   . Severe aortic stenosis   . Shortness of breath dyspnea    with exertion  . Type II or unspecified type diabetes mellitus without mention of complication,  uncontrolled   . Unspecified essential hypertension   . Unspecified hemorrhoids without mention of complication   . Unspecified hypothyroidism   . Urge incontinence    Past Surgical History:  Procedure Laterality Date  . ABDOMINAL HYSTERECTOMY  1987   complete  . BREAST SURGERY     right breast 3 surgeries [phone]  . CARDIAC CATHETERIZATION N/A 09/02/2016   Procedure: Right/Left Heart Cath and Coronary Angiography;  Surgeon: [name] M Martinique, MD;  Location: [place] [facility];  Service: Cardiovascular;  Laterality: N/A;  . CARDIOVERSION N/A 10/19/2015   Procedure: CARDIOVERSION;  Surgeon: Lelon Perla, MD;  Location: [place];  Service: Cardiovascular;  Laterality: N/A;  . [place]  . COLON SURGERY     2004,2007,2013.  3 times colon surgieres  . ESOPHAGOGASTRODUODENOSCOPY (EGD) WITH PROPOFOL Left 03/13/2017   Procedure: ESOPHAGOGASTRODUODENOSCOPY (EGD) WITH PROPOFOL;  Surgeon: Ronnette Juniper, MD;  Location: [place];  Service: Gastroenterology;  Laterality: Left;  . EXCISE LE MANDIBULAR LYMPH NODE T     DR C. NEWMAN  . FOOT SURGERY  1989  . LEFT SHOULDER ARTHROSCOPY    . 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 NEUROPLASTY / TRANSPOSITION MEDIAN NERVE AT CARPAL TUNNEL BILATERAL  05/2003  . TEE WITHOUT CARDIOVERSION N/A 10/19/2015   Procedure: Transesophageal Echocardiogram (TEE) ;  Surgeon: Lelon Perla, MD;  Location: [facility];  Service: Cardiovascular;  Laterality: N/A;  . TEE WITHOUT CARDIOVERSION N/A 10/14/2016   Procedure: TRANSESOPHAGEAL ECHOCARDIOGRAM (TEE);  Surgeon: Sherren Mocha, MD;  Location: [place];  Service: Open Heart Surgery;  Laterality: N/A;  . TOTAL SHOULDER ARTHROPLASTY Right 12/18/2015   Procedure: TOTAL SHOULDER ARTHROPLASTY;  Surgeon: [name], MD;  Location: [place];  Service: Orthopedics;  Laterality: Right;  . TRANSCATHETER AORTIC VALVE REPLACEMENT, TRANSFEMORAL N/A 10/14/2016   Procedure: TRANSCATHETER AORTIC VALVE  REPLACEMENT, TRANSFEMORAL;  Surgeon: Sherren Mocha, MD;  Location: [place];  Service: Open Heart Surgery;  Laterality: N/A;  . TRIGGER FINGER RELEASE Left   . VAGINAL CYST REMOVED  1967   Social History:   reports that she quit smoking about 27 years ago. She has a 40.00 pack-year smoking history. She has never used smokeless tobacco. She reports that she does not drink alcohol or use drugs.  Family History  Problem Relation Age of Onset  . Diabetes Father   . Stroke Father   . Heart disease Father   . Liver cancer Sister   . Heart disease Brother   . Asthma Sister   . Arthritis Sister     Medications: Patient's Medications  New Prescriptions   No medications on file  Previous Medications   ACETAMINOPHEN (TYLENOL) 500 MG TABLET    Take 500 mg by mouth every 6 (six) hours as needed for mild pain.   ALBUTEROL (PROVENTIL HFA;VENTOLIN HFA) 108 (90 BASE) MCG/ACT INHALER    Inhale 2 puffs into the lungs every 6 (six) hours as needed for wheezing or shortness of breath.   ALBUTEROL (PROVENTIL) (2.5 MG/3ML) 0.083% NEBULIZER SOLUTION    Take  3 mLs (2.5 mg total) by nebulization every 4 (four) hours as needed for wheezing or shortness of breath.   AMIODARONE (PACERONE) 200 MG TABLET    Take 0.5 tablets (100 mg total) by mouth daily. Take one tablet daily by mouth   APIXABAN (ELIQUIS) 5 MG TABS TABLET    Take 1 tablet (5 mg total) by mouth 2 (two) times daily.   CETIRIZINE (ZYRTEC) 10 MG TABLET    Take 10 mg by mouth daily as needed for allergies.    CHOLECALCIFEROL (VITAMIN D) 1000 UNITS TABLET    Take 1,000 Units by mouth daily.   COMFORT LANCETS MISC    Use daily to get blood for glucometer   DOXAZOSIN (CARDURA) 4 MG TABLET    Take 1 tablet (4 mg total) by mouth daily.   FERROUS SULFATE 325 (65 FE) MG EC TABLET    Take 325 mg by mouth 2 (two) times daily.   FUROSEMIDE (LASIX) 40 MG TABLET    Take 1 tablet (40 mg total) by mouth daily.   GLIMEPIRIDE (AMARYL) 2 MG TABLET    TAKE 1 TABLET BY  MOUTH AT  BREAKFAST TO CONTROL  GLUCOSE   GLUCOSE BLOOD (BAYER CONTOUR TEST) TEST STRIP    Use daily too check glucose Dx: E11.9   KETOTIFEN FUMARATE (ALLERGY EYE DROPS OP)    Place 1 drop into both eyes 3 (three) times daily as needed (allergies).    LEVOTHYROXINE (SYNTHROID, LEVOTHROID) 50 MCG TABLET    Take 1 tablet by mouth  daily for thyroid  supplement   MIRABEGRON ER (MYRBETRIQ) 25 MG TB24 TABLET    One daily to help bladder control   PANTOPRAZOLE (PROTONIX) 40 MG TABLET    TAKE 1 TABLET BY MOUTH TWO  TIMES DAILY   POTASSIUM GLUCONATE 595 (99 K) MG TABS TABLET    Take 595 mg by mouth daily. TAKES ONLY WHEN USING FUROSEMIDE   PRAVASTATIN (PRAVACHOL) 20 MG TABLET    TAKE 1 TABLET BY MOUTH  DAILY   TRIAMCINOLONE (KENALOG) 0.025 % CREAM    Apply 1 application topically daily as needed (psoriasis).  Modified Medications   Modified Medication Previous Medication   FLUTICASONE FUROATE-VILANTEROL (BREO ELLIPTA) 100-25 MCG/INH AEPB fluticasone furoate-vilanterol (BREO ELLIPTA) 100-25 MCG/INH AEPB      Inhale into the lungs once daily    Inhale into the lungs once daily  Discontinued Medications   PANTOPRAZOLE (PROTONIX) 40 MG TABLET    Take 1 tablet (40 mg total) by mouth 2 (two) times daily.     Physical Exam:  Vitals:   06/15/17 0837  BP: 126/80  Pulse: 63  Resp: 17  Temp: 97.8 F (36.6 C)  TempSrc: Oral  SpO2: 96%  Weight: 216 lb 9.6 oz (98.2 kg)  Height: _0  (1.6 m)   Body mass index is 38.37 kg/m.  Physical Exam  Constitutional: She is oriented to person, place, and time. No distress.  Morbid obesity  HENT:  Head: Normocephalic and atraumatic.  Nose: Nose normal.  Mouth/Throat: Oropharynx is clear and moist.  Eyes: Conjunctivae and EOM are normal.  Neck: Normal range of motion. Neck supple. No JVD present. No tracheal deviation present. No thyromegaly present.  Cardiovascular: Normal rate and regular rhythm.  Exam reveals no gallop and no friction rub.   Murmur (2/6  SEM) heard. Pulmonary/Chest: No respiratory distress. She has no wheezes. She exhibits no tenderness.  Abdominal: Bowel sounds are normal. She exhibits no distension and no mass. There  is no tenderness.  Musculoskeletal: Normal range of motion. She exhibits no edema or tenderness.  Lymphadenopathy:    She has no cervical adenopathy.  Neurological: She is alert and oriented to person, place, and time. She has normal reflexes. No cranial nerve deficit. Coordination normal.  Skin: No rash noted. No erythema. No pallor.  Psychiatric: She has a normal mood and affect. Judgment and thought content normal.    Labs reviewed: Basic Metabolic Panel:  Recent Labs  09/12/16 0023  10/15/16 0400  03/16/17 0930 03/26/17 0846 06/11/17 0832  NA  --   < > 138  < > 139 140 139  K  --   < > 4.6  < > 4.4 4.6 4.7  CL  --   < > 101  < > 106 100 101  CO2  --   < > 30  < > _0 GLUCOSE  --   < > 118*  < > 104* 151* 143*  BUN  --   < > 23*  < > 21 26* 30*  CREATININE  --   < > 1.24*  < > 1.57* 1.50* 1.30*  CALCIUM  --   < > 8.8*  < > 8.7 8.7 9.2  MG  --   --  1.9  --   --   --   --   TSH 1.297  --   --   --   --   --  8.98*  < > = values in this interval not displayed. Liver Function Tests:  Recent Labs  02/18/17 1043 03/11/17 1448 06/11/17 0832  AST 32 30 17  ALT 42* 34 20  ALKPHOS 65 52 56  BILITOT 0.2 0.4 0.3  PROT 7.0 6.6 6.6  ALBUMIN 4.0 3.5 3.8    Recent Labs  12/23/16 0935  LIPASE 34  AMYLASE 38   No results for input(s): AMMONIA in the last 8760 hours. CBC:  Recent Labs  01/20/17 1532  03/13/17 1227 03/16/17 0930 04/06/17 0941  WBC 6.0  < > 5.1 5.0 5.2  NEUTROABS 3,960  --   --  3,450 3,328  HGB 9.5*  < > 7.6* 8.3* 9.1*  HCT 30.3*  < > 24.7* 26.0* 29.2*  MCV 88.1  < > 89.2 88.7 90.1  PLT 284  < > 201 255 251  < > = values in this interval not displayed. Lipid Panel:  Recent Labs  06/11/17 0832  CHOL 158  HDL 63  LDLCALC 76  TRIG 97  CHOLHDL 2.5    TSH:  Recent Labs  09/12/16 0023 06/11/17 0832  TSH 1.297 8.98*   A1C: Lab Results  Component Value Date   HGBA1C 5.8 (H) 04/06/2017     Assessment/Plan 1. Chronic obstructive pulmonary disease, unspecified COPD type (HCC) Stable, conts on breo with albuterol as needed  - fluticasone furoate-vilanterol (BREO ELLIPTA) 100-25 MCG/INH AEPB; Inhale into the lungs once daily  Dispense: 1 each; Refill: 4  2. Need for immunization against influenza - Flu vaccine HIGH DOSE PF (Fluzone High dose)  3. Type 2 diabetes mellitus without complication, without long-term current use of insulin (HCC) -stabe, A1c 5.8 in June. Will add lisinopril 5 mg daily back for renal protection and recheck BMP prior to next visit. Will cont current diabetic medications.  4. Paroxysmal atrial fibrillation (HCC) Rate controlled. conts on amiodarone and eliquis.   5. Chronic diastolic CHF (congestive heart failure) (HCC) Stable, euvolemic. conts on lasix daily   6.  CKD (chronic kidney disease) stage 3, GFR 30-59 ml/min -improved. Encourage proper hydration and to avoid NSAIDS (Aleve, Advil, Motrin, Ibuprofen)  - BMP with eGFR; Future  7. Hypothyroidism, unspecified type TSH elevated. Will increase synthroid to 75 mcg at this time and follow up TSH in 8 weeks.  - levothyroxine (SYNTHROID, LEVOTHROID) 75 MCG tablet; Take 1 tablet by mouth  daily for thyroid  supplement - TSH; Future  8. UGIB (upper gastrointestinal bleed) Stable, no signs of recurrence. Will follow up CBC prior to next visit. conts on Protonix.   9. Anemia, unspecified type Trending up on last labs, conts on iron. - CBC with Differential/Platelets; Future  10. Hypertension, unspecified type -stable at this time. conts on cardura.   Next appt: 8 weeks after labs  [place]. Harle Battiest  [facility] [phone] 8 am - 5 pm) [phone] (after hours)

## 2017-06-15 NOTE — Telephone Encounter (Signed)
Patient requested Rx to be sent to [facility]. Faxed.

## 2017-06-15 NOTE — Patient Instructions (Addendum)
INCREASE synthroid to 75 mcg daily due to elevated TSH START lisinopril 5 mg daily (renal protection) -- call us if you need [place] to send this Rx to your pharmacy   Follow up in 8 weeks for lab work- & make appt so we can discuss lab work.

## 2017-06-16 ENCOUNTER — Other Ambulatory Visit: Payer: Self-pay | Admitting: Nurse Practitioner

## 2017-06-16 DIAGNOSIS — Z1231 Encounter for screening mammogram for malignant neoplasm of breast: Secondary | ICD-10-CM

## 2017-06-17 ENCOUNTER — Other Ambulatory Visit: Payer: Self-pay | Admitting: *Deleted

## 2017-06-17 DIAGNOSIS — E039 Hypothyroidism, unspecified: Secondary | ICD-10-CM

## 2017-06-17 MED ORDER — LEVOTHYROXINE SODIUM 75 MCG PO TABS: ORAL_TABLET | ORAL | 1 refills | Status: DC

## 2017-06-17 NOTE — Telephone Encounter (Signed)
[facility] [address]

## 2017-06-26 ENCOUNTER — Ambulatory Visit
Admission: RE | Admit: 2017-06-26 | Discharge: 2017-06-26 | Disposition: A | Discharge status: Home / Self Care | Payer: Medicare Other | Source: Ambulatory Visit | Attending: Nurse Practitioner | Admitting: Nurse Practitioner

## 2017-06-26 DIAGNOSIS — Z1231 Encounter for screening mammogram for malignant neoplasm of breast: Secondary | ICD-10-CM

## 2017-07-13 DIAGNOSIS — L304 Erythema intertrigo: Secondary | ICD-10-CM | POA: Diagnosis not present

## 2017-07-13 DIAGNOSIS — D1801 Hemangioma of skin and subcutaneous tissue: Secondary | ICD-10-CM | POA: Diagnosis not present

## 2017-07-13 DIAGNOSIS — L400 Psoriasis vulgaris: Secondary | ICD-10-CM | POA: Diagnosis not present

## 2017-07-13 DIAGNOSIS — D225 Melanocytic nevi of trunk: Secondary | ICD-10-CM | POA: Diagnosis not present

## 2017-07-13 DIAGNOSIS — L820 Inflamed seborrheic keratosis: Secondary | ICD-10-CM | POA: Diagnosis not present

## 2017-07-13 DIAGNOSIS — L821 Other seborrheic keratosis: Secondary | ICD-10-CM | POA: Diagnosis not present

## 2017-07-23 DIAGNOSIS — H10413 Chronic giant papillary conjunctivitis, bilateral: Secondary | ICD-10-CM | POA: Diagnosis not present

## 2017-07-28 ENCOUNTER — Telehealth: Payer: Self-pay | Admitting: Pulmonary Disease

## 2017-07-28 DIAGNOSIS — R911 Solitary pulmonary nodule: Secondary | ICD-10-CM

## 2017-07-28 NOTE — Telephone Encounter (Signed)
Judeen Hammans [facility] came to me  Patient is due for CT w/o contrast to follow up incidental 28m nodule in November 2018 AD is no longer with the office, per office protocol CT order has been changed to TP  Will sign off

## 2017-08-03 ENCOUNTER — Other Ambulatory Visit: Payer: Self-pay | Admitting: Nurse Practitioner

## 2017-08-03 DIAGNOSIS — E039 Hypothyroidism, unspecified: Secondary | ICD-10-CM

## 2017-08-05 IMAGING — CT CT CTA ABD/PEL W/CM AND/OR W/O CM
2 of 6 series · 12 of 46 positions shown, 14 images · IV contrast (Omni 300)
Comparison: Cardiac CTA 09/12/2016.

CLINICAL DATA: 80-year-old female with history of severe aortic
stenosis. Preprocedural study prior to potential transcatheter
aortic valve replacement (TAVR) procedure.

EXAM:
CT ANGIOGRAPHY CHEST, ABDOMEN AND PELVIS
TECHNIQUE: Multidetector CT imaging through the chest, abdomen and pelvis was
performed using the standard protocol during bolus administration of
intravenous contrast. Multiplanar reconstructed images and MIPs were
obtained and reviewed to evaluate the vascular anatomy.
CONTRAST:  100 mL of Isovue 370.

[Series 5: arterial · axial · arterial · 0.70mm/px · z∈[+866,+1382]mm · 9 of 196 slices shown, 11 images]
[im 12/196  soft-tissue]
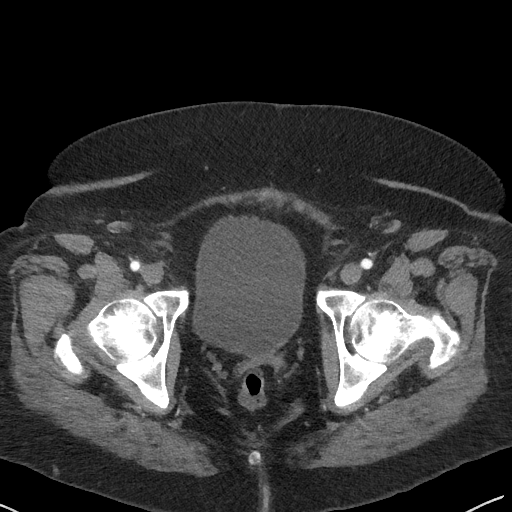
[im 12/196  bone]
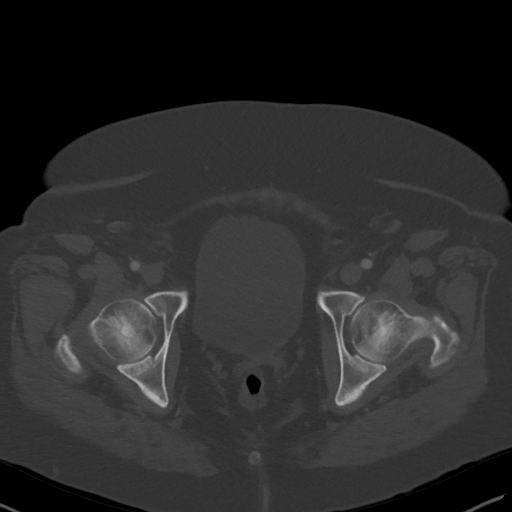
[im 35/196  soft-tissue]
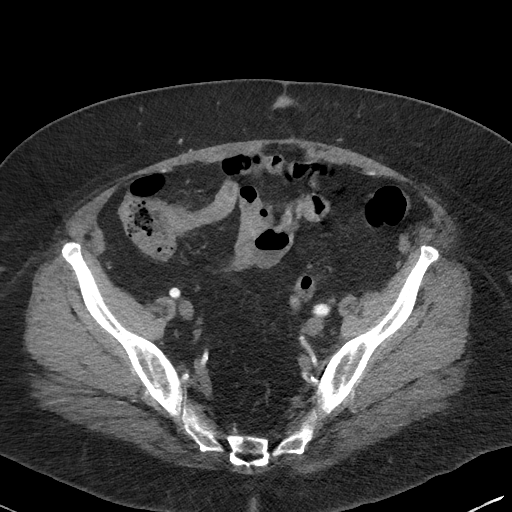
[im 58/196  soft-tissue]
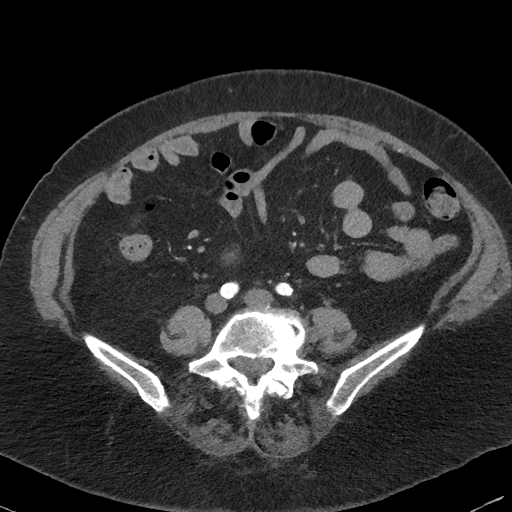
[im 81/196  soft-tissue]
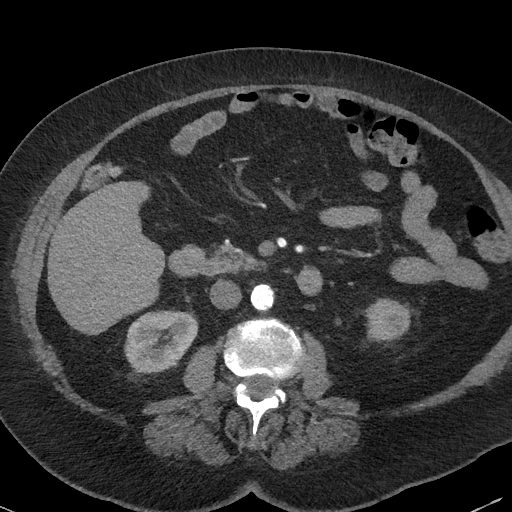
[im 104/196  soft-tissue]
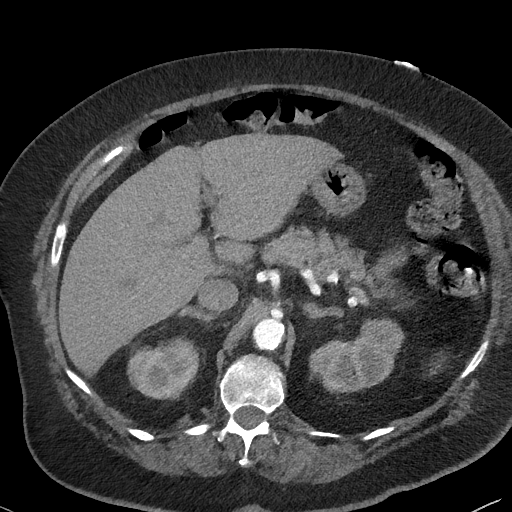
[im 115/196  soft-tissue]
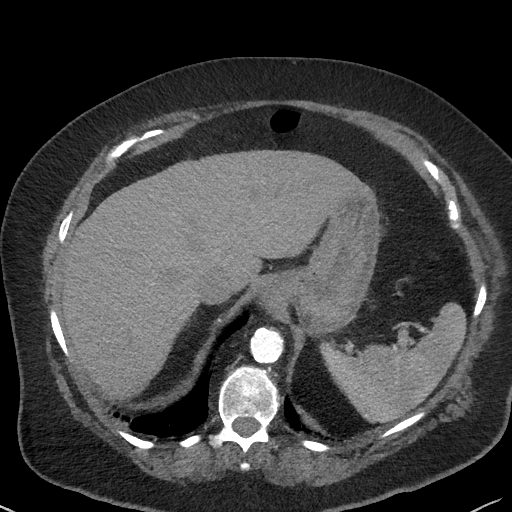
[im 138/196  soft-tissue]
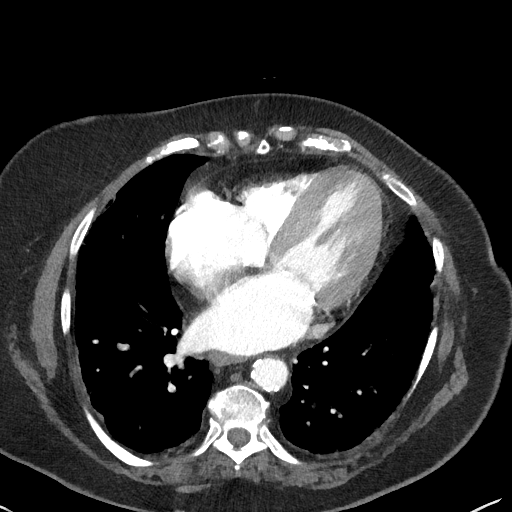
[im 161/196  soft-tissue]
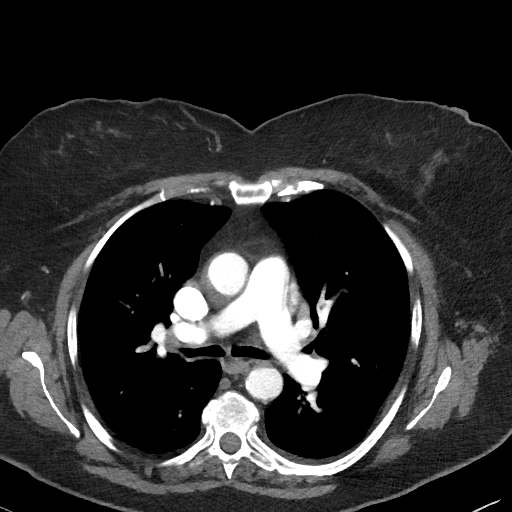
[im 184/196  soft-tissue]
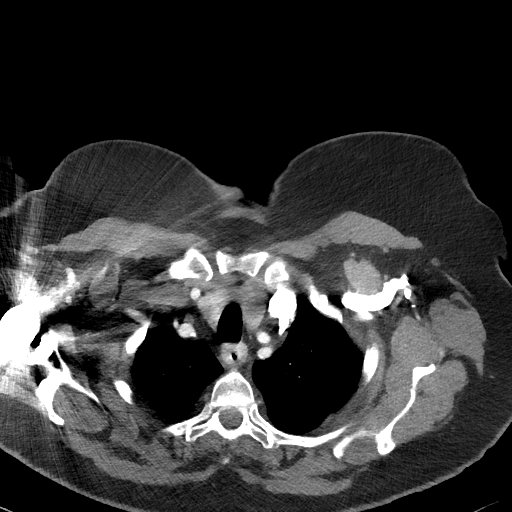
[im 184/196  bone]
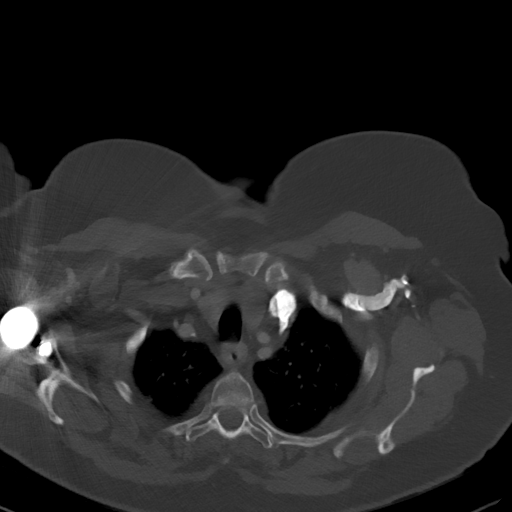

[Series 8: arterial cor · coronal · arterial · 0.86mm/px · 3 of 151 slices shown]
[im 38/151  soft-tissue]
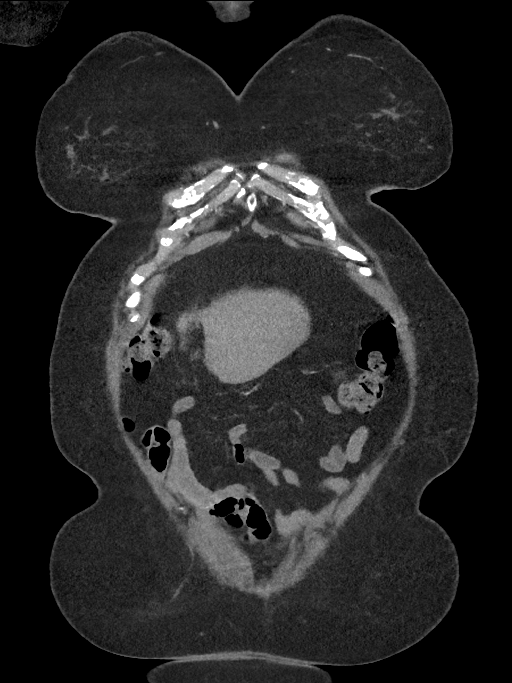
[im 76/151  soft-tissue]
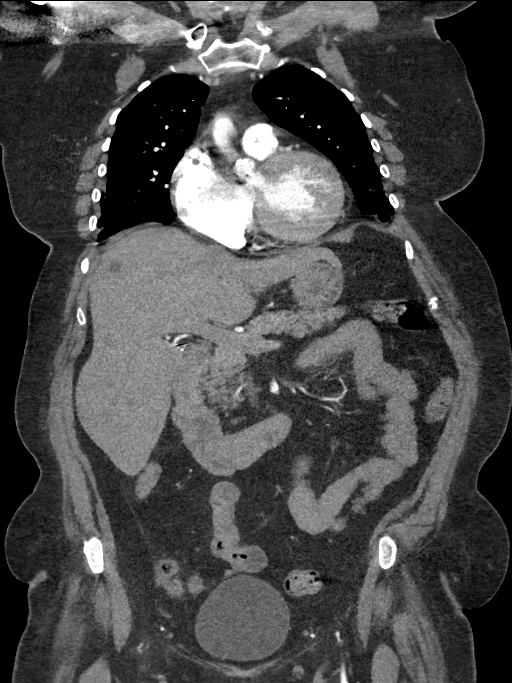
[im 113/151  soft-tissue]
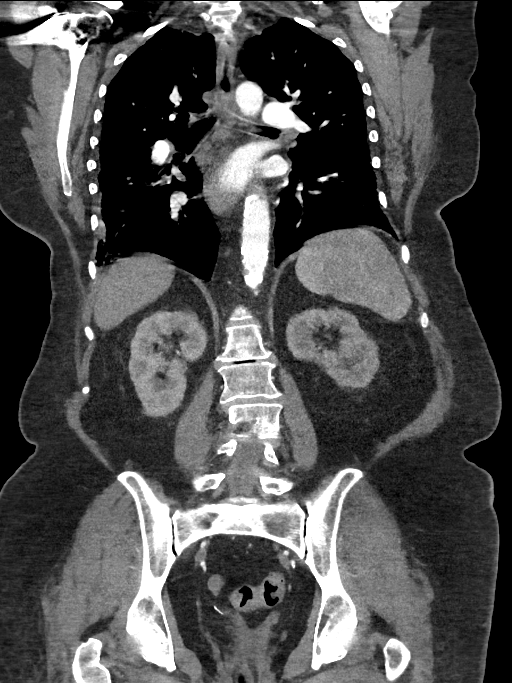

[12 of 46 positions shown; findings below may reference images not displayed]

FINDINGS: CTA CHEST FINDINGS

Cardiovascular: Heart size is enlarged with moderate left atrial
dilatation. There is no significant pericardial fluid, thickening or
pericardial calcification. There is aortic atherosclerosis, as well
as atherosclerosis of the great vessels of the mediastinum and the
coronary arteries, including calcified atherosclerotic plaque in the
left main, left anterior descending, left circumflex and right
coronary arteries. Thickening calcification of the aortic valve.
Calcification of the mitral annulus, particularly inferiorly. Bovine
type thoracic aortic arch (normal anatomical variant) incidentally
noted.

Mediastinum/Lymph Nodes: No pathologically enlarged mediastinal or
hilar lymph nodes. Esophagus is unremarkable in appearance. No
axillary lymphadenopathy.

Lungs/Pleura: 5 mm pulmonary nodule in the inferior segment of the
lingula (image 80 of series 7). No other larger more suspicious
appearing pulmonary nodules or masses are noted. Scattered areas of
subsegmental atelectasis and/or scarring are noted throughout the
periphery of the lungs bilaterally. No acute consolidative airspace
disease. No pleural effusions.

Musculoskeletal/Soft Tissues: There are no aggressive appearing
lytic or blastic lesions noted in the visualized portions of the
skeleton. Status post right shoulder arthroplasty.

CTA ABDOMEN AND PELVIS FINDINGS

Hepatobiliary: 1.6 cm well-defined low-attenuation lesion in the
periphery of segment 8 of the liver is compatible with a simple
cysts. No other suspicious hepatic lesions. No intra or extrahepatic
biliary ductal dilatation. Status post cholecystectomy.

Pancreas: No pancreatic mass. No pancreatic ductal dilatation. No
pancreatic or peripancreatic fluid or inflammatory changes.

Spleen: Unremarkable.

Adrenals/Urinary Tract: Bilateral adrenal glands and bilateral
kidneys are normal in appearance. No hydroureteronephrosis. Urinary
bladder is normal in appearance.

Stomach/Bowel: Normal appearance of the stomach. There is no
pathologic dilatation of small bowel or colon. A few scattered
colonic diverticulae are noted, without surrounding inflammatory
changes to suggest an acute diverticulitis at this time. Normal
appendix.

Vascular/Lymphatic: Aortic atherosclerosis with vascular findings
and measurements pertinent to potential TAVR procedure, as detailed
below. No aneurysm or dissection in the abdominal or pelvic
vasculature. Celiac axis, superior mesenteric artery and inferior
mesenteric artery are all widely patent. Single renal arteries
bilaterally are patent, although there appears to be mild moderate
stenosis at the ostium of both renal arteries. No lymphadenopathy
noted in the abdomen or pelvis.

Reproductive: Status post hysterectomy. Ovaries are not confidently
identified may be surgically absent or atrophic.

Other: Tiny umbilical hernia containing only omental fat
incidentally noted. No significant volume of ascites. No
pneumoperitoneum.

Musculoskeletal: There are no aggressive appearing lytic or blastic
lesions noted in the visualized portions of the skeleton.

VASCULAR MEASUREMENTS PERTINENT TO TAVR:

AORTA:

Minimal Aortic Diameter -  12 x 9 mm

Severity of Aortic Calcification -  moderate to severe

RIGHT PELVIS:

Right Common Iliac Artery -

Minimal Diameter - 5.0 x 3.8 mm

Tortuosity - mild

Calcification - moderate

Right External Iliac Artery -

Minimal Diameter - 6.8 x 7.0 mm

Tortuosity - mild

Calcification - minimal

Right Common Femoral Artery -

Minimal Diameter - 6.9 x 6.2 mm

Tortuosity - mild

Calcification - minimal

LEFT PELVIS:

Left Common Iliac Artery -

Minimal Diameter - 6.4 x 6.6 mm

Tortuosity - mild

Calcification - moderate

Left External Iliac Artery -

Minimal Diameter - 6.5 x 6.6 mm

Tortuosity - mild to moderate

Calcification - none

Left Common Femoral Artery -

Minimal Diameter - 7.4 x 5.0 mm

Tortuosity - mild

Calcification - mild

Review of the MIP images confirms the above findings.
IMPRESSION: 1. This patient does have suitable pelvic arterial access on the
left side, as detailed above.
2. Thickening and calcification of the aortic valve, compatible with
the reported clinical history of severe aortic stenosis.
3. Cardiomegaly with moderate left atrial dilatation.
4. Aortic atherosclerosis, in addition to left main and 3 vessel
coronary artery disease. Assessment for potential risk factor
modification, dietary therapy or pharmacologic therapy may be
warranted, if clinically indicated.
5. In addition, there appears to be mild to moderate stenosis at the
ostium of the renal arteries bilaterally.
6. 5 mm pulmonary nodule in the inferior segment of the lingula is
nonspecific. No follow-up needed if patient is low-risk.
Non-contrast chest CT can be considered in 12 months if patient is
high-risk. This recommendation follows the consensus statement:
Guidelines for Management of Incidental Pulmonary Nodules Detected
[DATE].
7. Mild colonic diverticulosis without evidence to suggest an acute
diverticulitis at this time.
8. Additional incidental findings, as above.

## 2017-08-06 ENCOUNTER — Other Ambulatory Visit: Payer: Medicare Other

## 2017-08-06 DIAGNOSIS — D649 Anemia, unspecified: Secondary | ICD-10-CM

## 2017-08-06 DIAGNOSIS — N183 Chronic kidney disease, stage 3 unspecified: Secondary | ICD-10-CM

## 2017-08-06 DIAGNOSIS — E039 Hypothyroidism, unspecified: Secondary | ICD-10-CM

## 2017-08-06 LAB — BASIC METABOLIC PANEL WITH GFR
BUN/Creatinine Ratio: 30 (calc) — ABNORMAL HIGH (ref 6–22)
BUN: 43 mg/dL — ABNORMAL HIGH (ref 7–25)
CO2: 30 mmol/L (ref 20–32)
Calcium: 9.2 mg/dL (ref 8.6–10.4)
Chloride: 102 mmol/L (ref 98–110)
Creat: 1.45 mg/dL — ABNORMAL HIGH (ref 0.60–0.93)
GFR, Est African American: 40 mL/min/{1.73_m2} — ABNORMAL LOW (ref >=60)
GFR, Est Non African American: 34 mL/min/{1.73_m2} — ABNORMAL LOW (ref >=60)
Glucose, Bld: 157 mg/dL — ABNORMAL HIGH (ref 65–99)
Potassium: 4.7 mmol/L (ref 3.5–5.3)
Sodium: 138 mmol/L (ref 135–146)

## 2017-08-06 LAB — CBC WITH DIFFERENTIAL/PLATELET
Basophils Absolute: 30 cells/uL (ref 0–200)
Basophils Relative: 0.5 %
Eosinophils Absolute: 118 cells/uL (ref 15–500)
Eosinophils Relative: 2 %
HCT: 29.5 % — ABNORMAL LOW (ref 35.0–45.0)
Hemoglobin: 9.5 g/dL — ABNORMAL LOW (ref 11.7–15.5)
Lymphs Abs: 1540 cells/uL (ref 850–3900)
MCH: 26.9 pg — ABNORMAL LOW (ref 27.0–33.0)
MCHC: 32.2 g/dL (ref 32.0–36.0)
MCV: 83.6 fL (ref 80.0–100.0)
MPV: 10.8 fL (ref 7.5–12.5)
Monocytes Relative: 9.8 %
Neutro Abs: 3634 cells/uL (ref 1500–7800)
Neutrophils Relative %: 61.6 %
Platelets: 199 10*3/uL (ref 140–400)
RBC: 3.53 10*6/uL — ABNORMAL LOW (ref 3.80–5.10)
RDW: 15.5 % — ABNORMAL HIGH (ref 11.0–15.0)
Total Lymphocyte: 26.1 %
WBC mixed population: 578 cells/uL (ref 200–950)
WBC: 5.9 10*3/uL (ref 3.8–10.8)

## 2017-08-06 LAB — TSH: TSH: 7.92 mIU/L — ABNORMAL HIGH (ref 0.40–4.50)

## 2017-08-10 ENCOUNTER — Ambulatory Visit: Payer: Medicare Other | Admitting: Nurse Practitioner

## 2017-08-11 ENCOUNTER — Other Ambulatory Visit: Payer: Self-pay | Admitting: Nurse Practitioner

## 2017-08-11 ENCOUNTER — Telehealth: Payer: Self-pay | Admitting: Cardiology

## 2017-08-11 DIAGNOSIS — N183 Chronic kidney disease, stage 3 unspecified: Secondary | ICD-10-CM

## 2017-08-11 MED ORDER — LEVOTHYROXINE SODIUM 88 MCG PO TABS: 88.0000 ug | ORAL_TABLET | Freq: Every day | ORAL | 3 refills | Status: DC

## 2017-08-11 NOTE — Telephone Encounter (Signed)
New message   Patient  Request sample , can pick up. Patient calling the office for samples of medication:   1.  What medication and dosage are you requesting samples for?apixaban (ELIQUIS) 5 MG TABS tablet  2.  Are you currently out of this medication? no

## 2017-08-12 NOTE — Telephone Encounter (Signed)
Advised patient no samples at this time

## 2017-08-17 ENCOUNTER — Other Ambulatory Visit: Payer: Self-pay | Admitting: *Deleted

## 2017-08-17 DIAGNOSIS — E119 Type 2 diabetes mellitus without complications: Secondary | ICD-10-CM

## 2017-08-17 MED ORDER — GLIMEPIRIDE 2 MG PO TABS: ORAL_TABLET | ORAL | 1 refills | Status: DC

## 2017-08-17 NOTE — Telephone Encounter (Signed)
Patient requested Rx to be sent to pharmacy. Faxed.

## 2017-08-19 ENCOUNTER — Ambulatory Visit (INDEPENDENT_AMBULATORY_CARE_PROVIDER_SITE_OTHER): Payer: Medicare Other | Admitting: Nurse Practitioner

## 2017-08-19 ENCOUNTER — Encounter: Payer: Self-pay | Admitting: Nurse Practitioner

## 2017-08-19 VITALS — BP 142/76 | HR 61 | Temp 97.6°F | Resp 17 | Ht 63.0 in | Wt 227.0 lb

## 2017-08-19 DIAGNOSIS — E039 Hypothyroidism, unspecified: Secondary | ICD-10-CM

## 2017-08-19 DIAGNOSIS — E119 Type 2 diabetes mellitus without complications: Secondary | ICD-10-CM

## 2017-08-19 DIAGNOSIS — H6122 Impacted cerumen, left ear: Secondary | ICD-10-CM | POA: Diagnosis not present

## 2017-08-19 DIAGNOSIS — N183 Chronic kidney disease, stage 3 unspecified: Secondary | ICD-10-CM

## 2017-08-19 DIAGNOSIS — J449 Chronic obstructive pulmonary disease, unspecified: Secondary | ICD-10-CM

## 2017-08-19 DIAGNOSIS — D649 Anemia, unspecified: Secondary | ICD-10-CM | POA: Diagnosis not present

## 2017-08-19 DIAGNOSIS — Z23 Encounter for immunization: Secondary | ICD-10-CM | POA: Diagnosis not present

## 2017-08-19 MED ORDER — ALBUTEROL SULFATE HFA 108 (90 BASE) MCG/ACT IN AERS: 2.0000 | INHALATION_SPRAY | Freq: Four times a day (QID) | RESPIRATORY_TRACT | 11 refills | Status: DC | PRN

## 2017-08-19 NOTE — Progress Notes (Signed)
Careteam: Patient Care Team: [name], NP as PCP - General (Geriatric Medicine) [name], MD as Consulting Physician (Orthopedic Surgery) Druscilla Brownie, MD as Consulting Physician (Dermatology) Ralene Bathe, MD as Consulting Physician (Ophthalmology)  Advanced Directive information Does Patient Have a Medical Advance Directive?: Yes, Type of Advance Directive: Healthcare Power of Attorney  Allergies  Allergen Reactions  . Codeine Other (See Comments)    "Spaces out" the patient and she cannot walk well  . Vesicare [Solifenacin] Itching    Chief Complaint  Patient presents with  . Medical Management of Chronic Issues    Pt is being seen for a 2 month routine visit. Pt states that she is having balance issues and no energy at all for several weeks.      HPI: Patient is a 81 y.o. female seen in the office today for 8 week follow up.  Started lisinopril 5 mg daily at last OV.  Has not taken any medication this morning. Does not take bp at home At home cbg was 121.   Does not have any energy. Been going on for several weeks and all she wants to do is sit and sleep.  TSH was elevated- synthroid was increased and she just started taking the new dose a few days ago.   Would like to hold off on nephrologist referral for now.   Eats a lot of snacky foods- high in starch and fats Breakfast- eggs and toast or cereal.  Lunch- will skip lunch or eat soup/sandwhich Dinner- rice, cheese and crab meat last night- something different all the time.  Cooks her own meals.   No exercise.   No falls.  Has pain in lower back and pain in lower knee.  Not interested in PT.   Review of Systems:  Review of Systems  Constitutional: Negative for chills, fever, malaise/fatigue and weight loss.  HENT: Positive for congestion. Negative for tinnitus.   Respiratory: Positive for shortness of breath (hx of asthma, chronic and stable). Negative for cough and sputum  production.   Cardiovascular: Negative for chest pain, palpitations and leg swelling.  Gastrointestinal: Negative for abdominal pain, constipation, diarrhea and heartburn.       Dark stools due to iron  Genitourinary: Negative for dysuria, frequency and urgency.  Musculoskeletal: Negative for back pain, falls, joint pain and myalgias.  Skin: Negative.   Neurological: Positive for dizziness (occasional). Negative for weakness and headaches.       Reports balance bad, sometimes will walk through the house and has to grab something. Not everyday. No falls  Endo/Heme/Allergies: Positive for environmental allergies.  Psychiatric/Behavioral: Negative for depression and memory loss. The patient does not have insomnia.     Past Medical History:  Diagnosis Date  . Allergy   . Anemia, unspecified   . Arthritis   . Asthma   . Atrial fibrillation status post cardioversion [facility]) 09/2015   s/p TEE/DCCV>>SR on amio  . Benign neoplasm of colon   . Carpal tunnel syndrome   . Cellulitis and abscess of finger, unspecified   . Cerumen impaction   . Cervicalgia   . CHF (congestive heart failure) ([place])   . Chronic airway obstruction, not elsewhere classified   . Chronic anticoagulation 09/2015   Xarelto for afib, CHADS2VASC=5  . Diarrhea   . Diffuse cystic mastopathy   . Dysrhythmia   . Edema   . Encounter for long-term (current) use of other medications   . GERD (gastroesophageal reflux disease)   .  Heart murmur   . Lumbago   . Neck pain 05/23/2015  . Obesity, unspecified   . Other and unspecified hyperlipidemia   . Other psoriasis   . Pain in joint, lower leg   . PONV (postoperative nausea and vomiting)   . Primary localized osteoarthrosis of right shoulder 12/18/2015  . Routine gynecological examination   . 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 Scoliosis (and  kyphoscoliosis), idiopathic   . Severe aortic stenosis   . Shortness of breath dyspnea    with exertion  . Type II or unspecified type diabetes mellitus without mention of complication, uncontrolled   . Unspecified essential hypertension   . Unspecified hemorrhoids without mention of complication   . Unspecified hypothyroidism   . Urge incontinence    Past Surgical History:  Procedure Laterality Date  . ABDOMINAL HYSTERECTOMY  1987   complete  . BREAST BIOPSY Right   . BREAST EXCISIONAL BIOPSY Left 2011  . BREAST EXCISIONAL BIOPSY Right    x2  . BREAST SURGERY     right breast 3 surgeries [phone]  . CARDIAC CATHETERIZATION N/A 09/02/2016   Procedure: Right/Left Heart Cath and Coronary Angiography;  Surgeon: [name] M Martinique, MD;  Location: [place] [facility];  Service: Cardiovascular;  Laterality: N/A;  . CARDIOVERSION N/A 10/19/2015   Procedure: CARDIOVERSION;  Surgeon: Lelon Perla, MD;  Location: [place];  Service: Cardiovascular;  Laterality: N/A;  . [place]  . COLON SURGERY     2004,2007,2013.  3 times colon surgieres  . ESOPHAGOGASTRODUODENOSCOPY (EGD) WITH PROPOFOL Left 03/13/2017   Procedure: ESOPHAGOGASTRODUODENOSCOPY (EGD) WITH PROPOFOL;  Surgeon: Ronnette Juniper, MD;  Location: [place];  Service: Gastroenterology;  Laterality: Left;  . EXCISE LE MANDIBULAR LYMPH NODE T     DR C. NEWMAN  . FOOT SURGERY  1989  . LEFT SHOULDER ARTHROSCOPY    . 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 NEUROPLASTY / TRANSPOSITION MEDIAN NERVE AT CARPAL TUNNEL BILATERAL  05/2003  . TEE WITHOUT CARDIOVERSION N/A 10/19/2015   Procedure: Transesophageal Echocardiogram (TEE) ;  Surgeon: Lelon Perla, MD;  Location: [facility];  Service: Cardiovascular;  Laterality: N/A;  . TEE WITHOUT CARDIOVERSION N/A 10/14/2016   Procedure: TRANSESOPHAGEAL ECHOCARDIOGRAM (TEE);  Surgeon: Sherren Mocha, MD;  Location: [place];  Service: Open Heart Surgery;  Laterality: N/A;   . TOTAL SHOULDER ARTHROPLASTY Right 12/18/2015   Procedure: TOTAL SHOULDER ARTHROPLASTY;  Surgeon: [name], MD;  Location: [place];  Service: Orthopedics;  Laterality: Right;  . TRANSCATHETER AORTIC VALVE REPLACEMENT, TRANSFEMORAL N/A 10/14/2016   Procedure: TRANSCATHETER AORTIC VALVE REPLACEMENT, TRANSFEMORAL;  Surgeon: Sherren Mocha, MD;  Location: [place];  Service: Open Heart Surgery;  Laterality: N/A;  . TRIGGER FINGER RELEASE Left   . VAGINAL CYST REMOVED  1967   Social History:   reports that she quit smoking about 27 years ago. She has a 40.00 pack-year smoking history. She has never used smokeless tobacco. She reports that she does not drink alcohol or use drugs.  Family History  Problem Relation Age of Onset  . Diabetes Father   . Stroke Father   . Heart disease Father   . Liver cancer Sister   . Heart disease Brother   . Asthma Sister   . Arthritis Sister     Medications: Patient's Medications  New Prescriptions   No medications on file  Previous Medications   ACETAMINOPHEN (TYLENOL) 500 MG TABLET    Take 500 mg by mouth every 6 (six) hours as needed for mild pain.   ALBUTEROL (PROVENTIL HFA;VENTOLIN HFA) 108 (90 BASE) MCG/ACT INHALER    Inhale 2 puffs into the lungs every 6 (six) hours as needed for wheezing or shortness of breath.   ALBUTEROL (PROVENTIL) (2.5 MG/3ML) 0.083% NEBULIZER SOLUTION    Take 3 mLs (2.5 mg total) by nebulization every 4 (four) hours as needed for wheezing or shortness of breath.   AMIODARONE (PACERONE) 200 MG TABLET    Take 0.5 tablets (100 mg total) by mouth daily. Take one tablet daily by mouth   APIXABAN (ELIQUIS) 5 MG TABS TABLET    Take 1 tablet (5 mg total) by mouth 2 (two) times daily.   CETIRIZINE (ZYRTEC) 10 MG TABLET    Take 10 mg by mouth daily as needed for allergies.    CHOLECALCIFEROL (VITAMIN D) 1000 UNITS TABLET    Take 1,000 Units by mouth daily.   COMFORT LANCETS MISC    Use daily to get blood for glucometer   DOXAZOSIN  (CARDURA) 4 MG TABLET    Take 1 tablet (4 mg total) by mouth daily.   FERROUS SULFATE 325 (65 FE) MG EC TABLET    Take 325 mg by mouth 2 (two) times daily.   FLUTICASONE FUROATE-VILANTEROL (BREO ELLIPTA) 100-25 MCG/INH AEPB    Inhale into the lungs once daily   FUROSEMIDE (LASIX) 40 MG TABLET    Take 1 tablet (40 mg total) by mouth daily.   GLIMEPIRIDE (AMARYL) 2 MG TABLET    Take one tablet by mouth once daily at breakfast to control glucose   GLUCOSE BLOOD (BAYER CONTOUR TEST) TEST STRIP    Use daily too check glucose Dx: E11.9   KETOTIFEN FUMARATE (ALLERGY EYE DROPS OP)    Place 1 drop into both eyes 3 (three) times daily as needed (allergies).    LEVOTHYROXINE (SYNTHROID, LEVOTHROID) 88 MCG TABLET    Take 1 tablet (88 mcg total) by mouth daily.   LISINOPRIL (PRINIVIL,ZESTRIL) 5 MG TABLET    Take 1 tablet (5 mg total) by mouth daily.   MIRABEGRON ER (MYRBETRIQ) 25 MG TB24 TABLET    One daily to help bladder control   PANTOPRAZOLE (PROTONIX) 40 MG TABLET    TAKE 1 TABLET BY MOUTH TWO  TIMES DAILY   POTASSIUM GLUCONATE 595 (99 K) MG TABS TABLET    Take 595 mg by mouth daily. TAKES ONLY WHEN USING FUROSEMIDE   PRAVASTATIN (PRAVACHOL) 20 MG TABLET    TAKE 1 TABLET BY MOUTH  DAILY   TRIAMCINOLONE (KENALOG) 0.025 % CREAM    Apply 1 application topically daily as needed (psoriasis).  Modified Medications   No medications on file  Discontinued Medications   No medications on file     Physical Exam:  Vitals:   08/19/17 0835  BP: (!) 142/76  Pulse: 61  Resp: 17  Temp: 97.6 F (36.4 C)  TempSrc: Oral  SpO2: 97%  Weight: 227 lb (103 kg)  Height: _0  (1.6 m)   Body mass index is 40.21 kg/m.  Physical Exam  Constitutional: She is oriented to person, place, and time. No distress.  Morbid obesity  HENT:  Head: Normocephalic and atraumatic.  Nose: Nose normal.  Mouth/Throat: Oropharynx is clear and moist.  Eyes: Conjunctivae and EOM are normal.  Neck: Normal range of motion. Neck  supple. No tracheal deviation present. No  thyromegaly present.  Cardiovascular: Normal rate and regular rhythm.  Exam reveals no gallop and no friction rub.   Murmur (2/6 SEM) heard. Pulmonary/Chest: Effort normal and breath sounds normal. No respiratory distress. She has no wheezes. She exhibits no tenderness.  Abdominal: Soft. Bowel sounds are normal. She exhibits no distension and no mass. There is no tenderness.  Musculoskeletal: Normal range of motion. She exhibits no edema or tenderness.  Neurological: She is alert and oriented to person, place, and time. She has normal reflexes. She displays normal reflexes. No cranial nerve deficit. Coordination normal.  Skin: No rash noted. No erythema. No pallor.  Psychiatric: She has a normal mood and affect. Judgment and thought content normal.    Labs reviewed: Basic Metabolic Panel:  Recent Labs  09/12/16 0023  10/15/16 0400  03/26/17 0846 06/11/17 0832 08/06/17 0840  NA  --   < > 138  < > 140 139 138  K  --   < > 4.6  < > 4.6 4.7 4.7  CL  --   < > 101  < > 100 101 102  CO2  --   < > 30  < > _0 GLUCOSE  --   < > 118*  < > 151* 143* 157*  BUN  --   < > 23*  < > 26* 30* 43*  CREATININE  --   < > 1.24*  < > 1.50* 1.30* 1.45*  CALCIUM  --   < > 8.8*  < > 8.7 9.2 9.2  MG  --   --  1.9  --   --   --   --   TSH 1.297  --   --   --   --  8.98* 7.92*  < > = values in this interval not displayed. Liver Function Tests:  Recent Labs  02/18/17 1043 03/11/17 1448 06/11/17 0832  AST 32 30 17  ALT 42* 34 20  ALKPHOS 65 52 56  BILITOT 0.2 0.4 0.3  PROT 7.0 6.6 6.6  ALBUMIN 4.0 3.5 3.8    Recent Labs  12/23/16 0935  LIPASE 34  AMYLASE 38   No results for input(s): AMMONIA in the last 8760 hours. CBC:  Recent Labs  03/16/17 0930 04/06/17 0941 08/06/17 0840  WBC 5.0 5.2 5.9  NEUTROABS 3,450 3,328 3,634  HGB 8.3* 9.1* 9.5*  HCT 26.0* 29.2* 29.5*  MCV 88.7 90.1 83.6  PLT 255 251 199   Lipid Panel:  Recent Labs   06/11/17 0832  CHOL 158  HDL 63  LDLCALC 76  TRIG 97  CHOLHDL 2.5   TSH:  Recent Labs  09/12/16 0023 06/11/17 0832 08/06/17 0840  TSH 1.297 8.98* 7.92*   A1C: Lab Results  Component Value Date   HGBA1C 5.8 (H) 04/06/2017     Assessment/Plan 1. Chronic obstructive pulmonary disease, unspecified COPD type ([place]) Stable on breo, follows with pulmonary  - albuterol (VENTOLIN HFA) 108 (90 Base) MCG/ACT inhaler; Inhale 2 puffs into the lungs every 6 (six) hours as needed for wheezing or shortness of breath.  Dispense: 1 Inhaler; Refill: 11  2. Type 2 diabetes mellitus without complication, without long-term current use of insulin (HCC) Stable, cont on current regimen. A1c was at goal in June.  - Hemoglobin A1c; Future  3. CKD (chronic kidney disease) stage 3, GFR 30-59 ml/min (HCC) -would not like to see nephrologist at this time. Recently started on ACE for renal protection. Encouraged to stay hydrated and  avoid NSAIDS. Will monitor in office for now.  - CMP with eGFR; Future  4. Hypothyroidism, unspecified type -has increased synthroid to 88 mcg, just started new dosing. Will follow up TSH prior to next OV - TSH; Future  5. Impacted cerumen of left ear -to use debrox BID for 4 days and make follow up appt for 5th day.   6. Anemia, unspecified type -currently on iron - CBC with Differential/Platelets; Future  7. Need for pneumococcal vaccination - Pneumococcal polysaccharide vaccine 23-valent greater than or equal to 2yo subcutaneous/IM  8. Fatigue Reports fatigue but does not eat correctly or participate in exercise. Complains of back and knee pains, no balance issues today in office, encouraged exercise to help with pain and energy level and offered PT but she was not interested.  Encouraged 3 meals a day with proper nutrition.   Next appt: 5 days and 3 months.  Carlos American. Harle Battiest  [facility] [phone] 8 am - 5  pm) [phone] (after hours)

## 2017-08-19 NOTE — Patient Instructions (Addendum)
To get [facility] over the counter administer 5-10 drops twice daily for 4 days then make appt for the next day for ear lavage   Encourage exercise to help with leg and core strength  Focus on healthy eating habits   Follow up in 3 months with labs prior to appt.   DASH Eating Plan DASH stands for "Dietary Approaches to Stop Hypertension." The DASH eating plan is a healthy eating plan that has been shown to reduce high blood pressure (hypertension). It may also reduce your risk for type 2 diabetes, heart disease, and stroke. The DASH eating plan may also help with weight loss. What are tips for following this plan? General guidelines  Avoid eating more than 2,300 mg (milligrams) of salt (sodium) a day. If you have hypertension, you may need to reduce your sodium intake to 1,500 mg a day.  Limit alcohol intake to no more than 1 drink a day for nonpregnant women and 2 drinks a day for men. One drink equals 12 oz of beer, 5 oz of wine, or 1 oz of hard liquor.  Work with your health care provider to maintain a healthy body weight or to lose weight. Ask what an ideal weight is for you.  Get at least 30 minutes of exercise that causes your heart to beat faster (aerobic exercise) most days of the week. Activities may include walking, swimming, or biking.  Work with your health care provider or diet and nutrition specialist (dietitian) to adjust your eating plan to your individual calorie needs. Reading food labels  Check food labels for the amount of sodium per serving. Choose foods with less than 5 percent of the Daily Value of sodium. Generally, foods with less than 300 mg of sodium per serving fit into this eating plan.  To find whole grains, look for the word "whole" as the first word in the ingredient list. Shopping  Buy products labeled as "low-sodium" or "no salt added."  Buy fresh foods. Avoid canned foods and premade or frozen meals. Cooking  Avoid adding salt when cooking. Use  salt-free seasonings or herbs instead of table salt or sea salt. Check with your health care provider or pharmacist before using salt substitutes.  Do not fry foods. Cook foods using healthy methods such as baking, boiling, grilling, and broiling instead.  Cook with heart-healthy oils, such as olive, canola, soybean, or sunflower oil. Meal planning   Eat a balanced diet that includes: ? 5 or more servings of fruits and vegetables each day. At each meal, try to fill half of your plate with fruits and vegetables. ? Up to 6-8 servings of whole grains each day. ? Less than 6 oz of lean meat, poultry, or fish each day. A 3-oz serving of meat is about the same size as a deck of cards. One egg equals 1 oz. ? 2 servings of low-fat dairy each day. ? A serving of nuts, seeds, or beans 5 times each week. ? Heart-healthy fats. Healthy fats called Omega-3 fatty acids are found in foods such as flaxseeds and coldwater fish, like sardines, salmon, and mackerel.  Limit how much you eat of the following: ? Canned or prepackaged foods. ? Food that is high in trans fat, such as fried foods. ? Food that is high in saturated fat, such as fatty meat. ? Sweets, desserts, sugary drinks, and other foods with added sugar. ? Full-fat dairy products.  Do not salt foods before eating.  Try to eat at least 2  vegetarian meals each week.  Eat more home-cooked food and less restaurant, buffet, and fast food.  When eating at a restaurant, ask that your food be prepared with less salt or no salt, if possible. What foods are recommended? The items listed may not be a complete list. Talk with your dietitian about what dietary choices are best for you. Grains Whole-grain or whole-wheat bread. Whole-grain or whole-wheat pasta. Brown rice. [place]. Bulgur. Whole-grain and low-sodium cereals. Pita bread. Low-fat, low-sodium crackers. Whole-wheat flour tortillas. Vegetables Fresh or frozen vegetables (raw, steamed,  roasted, or grilled). Low-sodium or reduced-sodium tomato and vegetable juice. Low-sodium or reduced-sodium tomato sauce and tomato paste. Low-sodium or reduced-sodium canned vegetables. Fruits All fresh, dried, or frozen fruit. Canned fruit in natural juice (without added sugar). Meat and other protein foods Skinless chicken or [place]. Ground chicken or [place]. Pork with fat trimmed off. Fish and seafood. Egg whites. Dried beans, peas, or lentils. Unsalted nuts, nut butters, and seeds. Unsalted canned beans. Lean cuts of beef with fat trimmed off. Low-sodium, lean deli meat. Dairy Low-fat (1%) or fat-free (skim) milk. Fat-free, low-fat, or reduced-fat cheeses. Nonfat, low-sodium ricotta or cottage cheese. Low-fat or nonfat yogurt. Low-fat, low-sodium cheese. Fats and oils Soft margarine without trans fats. Vegetable oil. Low-fat, reduced-fat, or light mayonnaise and salad dressings (reduced-sodium). Canola, safflower, olive, soybean, and sunflower oils. Avocado. Seasoning and other foods Herbs. Spices. Seasoning mixes without salt. Unsalted popcorn and pretzels. Fat-free sweets. What foods are not recommended? The items listed may not be a complete list. Talk with your dietitian about what dietary choices are best for you. Grains Baked goods made with fat, such as croissants, muffins, or some breads. Dry pasta or rice meal packs. Vegetables Creamed or fried vegetables. Vegetables in a cheese sauce. Regular canned vegetables (not low-sodium or reduced-sodium). Regular canned tomato sauce and paste (not low-sodium or reduced-sodium). Regular tomato and vegetable juice (not low-sodium or reduced-sodium). Angie Fava. Olives. Fruits Canned fruit in a light or heavy syrup. Fried fruit. Fruit in cream or butter sauce. Meat and other protein foods Fatty cuts of meat. Ribs. Fried meat. Berniece Salines. Sausage. Bologna and other processed lunch meats. Salami. Fatback. Hotdogs. Bratwurst. Salted nuts and seeds. Canned  beans with added salt. Canned or smoked fish. Whole eggs or egg yolks. Chicken or [place] with skin. Dairy Whole or 2% milk, cream, and half-and-half. Whole or full-fat cream cheese. Whole-fat or sweetened yogurt. Full-fat cheese. Nondairy creamers. Whipped toppings. Processed cheese and cheese spreads. Fats and oils Butter. Stick margarine. Lard. Shortening. Ghee. Bacon fat. Tropical oils, such as coconut, palm kernel, or palm oil. Seasoning and other foods Salted popcorn and pretzels. Onion salt, garlic salt, seasoned salt, table salt, and sea salt. Worcestershire sauce. Tartar sauce. Barbecue sauce. Teriyaki sauce. Soy sauce, including reduced-sodium. Steak sauce. Canned and packaged gravies. Fish sauce. Oyster sauce. Cocktail sauce. Horseradish that you find on the shelf. Ketchup. Mustard. Meat flavorings and tenderizers. Bouillon cubes. Hot sauce and Tabasco sauce. Premade or packaged marinades. Premade or packaged taco seasonings. Relishes. Regular salad dressings. Where to find more information:  National Heart, Lung, and [place]: https://wilson-eaton.com/  American Heart Association: www.heart.org Summary  The DASH eating plan is a healthy eating plan that has been shown to reduce high blood pressure (hypertension). It may also reduce your risk for type 2 diabetes, heart disease, and stroke.  With the DASH eating plan, you should limit salt (sodium) intake to 2,300 mg a day. If you have hypertension, you may need  to reduce your sodium intake to 1,500 mg a day.  When on the DASH eating plan, aim to eat more fresh fruits and vegetables, whole grains, lean proteins, low-fat dairy, and heart-healthy fats.  Work with your health care provider or diet and nutrition specialist (dietitian) to adjust your eating plan to your individual calorie needs. This information is not intended to replace advice given to you by your health care provider. Make sure you discuss any questions you have with your  health care provider. Document Released: 10/02/2011 Document Revised: 10/06/2016 Document Reviewed: 10/06/2016 Elsevier Interactive Patient Education  2017 [employer].

## 2017-08-24 ENCOUNTER — Encounter: Payer: Self-pay | Admitting: Nurse Practitioner

## 2017-08-24 ENCOUNTER — Ambulatory Visit (INDEPENDENT_AMBULATORY_CARE_PROVIDER_SITE_OTHER): Payer: Medicare Other | Admitting: Nurse Practitioner

## 2017-08-24 VITALS — BP 132/82 | HR 62 | Temp 97.9°F | Resp 18 | Ht 63.0 in | Wt 229.0 lb

## 2017-08-24 DIAGNOSIS — H6122 Impacted cerumen, left ear: Secondary | ICD-10-CM | POA: Diagnosis not present

## 2017-08-24 NOTE — Progress Notes (Signed)
Careteam: Patient Care Team: [name], NP as PCP - General (Geriatric Medicine) [name], MD as Consulting Physician (Orthopedic Surgery) Druscilla Brownie, MD as Consulting Physician (Dermatology) Ralene Bathe, MD as Consulting Physician (Ophthalmology)  Advanced Directive information Does Patient Have a Medical Advance Directive?: Yes, Type of Advance Directive: Healthcare Power of Attorney  Allergies  Allergen Reactions  . Codeine Other (See Comments)    "Spaces out" the patient and she cannot walk well  . Vesicare [Solifenacin] Itching    Chief Complaint  Patient presents with  . Follow-up    Pt is being seen to follow up on bilateral ear impaction.      HPI: Patient is a 81 y.o. Patricia [name] seen in the office today to follow up ear impaction. Pt was instructed to use debrox for 5 days and follow up in office.  She was able to use the debrox. States it made her ear feel funny but she was able to tolerate the whole 5 days.     Review of Systems:  Review of Systems  Constitutional: Negative for chills and fever.  HENT: Negative for ear discharge, ear pain, hearing loss and tinnitus.     Past Medical History:  Diagnosis Date  . Allergy   . Anemia, unspecified   . Arthritis   . Asthma   . Atrial fibrillation status post cardioversion [facility]) 09/2015   s/p TEE/DCCV>>SR on amio  . Benign neoplasm of colon   . Carpal tunnel syndrome   . Cellulitis and abscess of finger, unspecified   . Cerumen impaction   . Cervicalgia   . CHF (congestive heart failure) ([place])   . Chronic airway obstruction, not elsewhere classified   . Chronic anticoagulation 09/2015   Xarelto for afib, CHADS2VASC=5  . Diarrhea   . Diffuse cystic mastopathy   . Dysrhythmia   . Edema   . Encounter for long-term (current) use of other medications   . GERD (gastroesophageal reflux disease)   . Heart murmur   . Lumbago   . Neck pain 05/23/2015  . Obesity, unspecified   . Other  and unspecified hyperlipidemia   . Other psoriasis   . Pain in joint, lower leg   . PONV (postoperative nausea and vomiting)   . Primary localized osteoarthrosis of right shoulder 12/18/2015  . Routine gynecological examination   . 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 Scoliosis (and kyphoscoliosis), idiopathic   . Severe aortic stenosis   . Shortness of breath dyspnea    with exertion  . Type II or unspecified type diabetes mellitus without mention of complication, uncontrolled   . Unspecified essential hypertension   . Unspecified hemorrhoids without mention of complication   . Unspecified hypothyroidism   . Urge incontinence    Past Surgical History:  Procedure Laterality Date  . ABDOMINAL HYSTERECTOMY  1987   complete  . BREAST BIOPSY Right   . BREAST EXCISIONAL BIOPSY Left 2011  . BREAST EXCISIONAL BIOPSY Right    x2  . BREAST SURGERY     right breast 3 surgeries [phone]  . CARDIAC CATHETERIZATION N/A 09/02/2016   Procedure: Right/Left Heart Cath and Coronary Angiography;  Surgeon: [name] M Martinique, MD;  Location: [place] [facility];  Service: Cardiovascular;  Laterality: N/A;  . CARDIOVERSION N/A 10/19/2015   Procedure: CARDIOVERSION;  Surgeon: Lelon Perla, MD;  Location: [place];  Service:  Cardiovascular;  Laterality: N/A;  . [place]  . COLON SURGERY     2004,2007,2013.  3 times colon surgieres  . ESOPHAGOGASTRODUODENOSCOPY (EGD) WITH PROPOFOL Left 03/13/2017   Procedure: ESOPHAGOGASTRODUODENOSCOPY (EGD) WITH PROPOFOL;  Surgeon: Ronnette Juniper, MD;  Location: [place];  Service: Gastroenterology;  Laterality: Left;  . EXCISE LE MANDIBULAR LYMPH NODE T     DR C. NEWMAN  . FOOT SURGERY  1989  . LEFT SHOULDER ARTHROSCOPY    . 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 NEUROPLASTY / TRANSPOSITION MEDIAN NERVE AT  CARPAL TUNNEL BILATERAL  05/2003  . TEE WITHOUT CARDIOVERSION N/A 10/19/2015   Procedure: Transesophageal Echocardiogram (TEE) ;  Surgeon: Lelon Perla, MD;  Location: [facility] ENDOSCOPY;  Service: Cardiovascular;  Laterality: N/A;  . TEE WITHOUT CARDIOVERSION N/A 10/14/2016   Procedure: TRANSESOPHAGEAL ECHOCARDIOGRAM (TEE);  Surgeon: Sherren Mocha, MD;  Location: [place];  Service: Open Heart Surgery;  Laterality: N/A;  . TOTAL SHOULDER ARTHROPLASTY Right 12/18/2015   Procedure: TOTAL SHOULDER ARTHROPLASTY;  Surgeon: [name], MD;  Location: [place];  Service: Orthopedics;  Laterality: Right;  . TRANSCATHETER AORTIC VALVE REPLACEMENT, TRANSFEMORAL N/A 10/14/2016   Procedure: TRANSCATHETER AORTIC VALVE REPLACEMENT, TRANSFEMORAL;  Surgeon: Sherren Mocha, MD;  Location: [place];  Service: Open Heart Surgery;  Laterality: N/A;  . TRIGGER FINGER RELEASE Left   . VAGINAL CYST REMOVED  1967   Social History:   reports that she quit smoking about 27 years ago. She has a 40.00 pack-year smoking history. She has never used smokeless tobacco. She reports that she does not drink alcohol or use drugs.  Family History  Problem Relation Age of Onset  . Diabetes Father   . Stroke Father   . Heart disease Father   . Liver cancer Sister   . Heart disease Brother   . Asthma Sister   . Arthritis Sister     Medications: Patient's Medications  New Prescriptions   No medications on file  Previous Medications   ACETAMINOPHEN (TYLENOL) 500 MG TABLET    Take 500 mg by mouth every 6 (six) hours as needed for mild pain.   ALBUTEROL (PROVENTIL) (2.5 MG/3ML) 0.083% NEBULIZER SOLUTION    Take 3 mLs (2.5 mg total) by nebulization every 4 (four) hours as needed for wheezing or shortness of breath.   ALBUTEROL (VENTOLIN HFA) 108 (90 BASE) MCG/ACT INHALER    Inhale 2 puffs into the lungs every 6 (six) hours as needed for wheezing or shortness of breath.   AMIODARONE (PACERONE) 200 MG TABLET    Take 0.5 tablets (100 mg  total) by mouth daily. Take one tablet daily by mouth   APIXABAN (ELIQUIS) 5 MG TABS TABLET    Take 1 tablet (5 mg total) by mouth 2 (two) times daily.   CETIRIZINE (ZYRTEC) 10 MG TABLET    Take 10 mg by mouth daily as needed for allergies.    CHOLECALCIFEROL (VITAMIN D) 1000 UNITS TABLET    Take 1,000 Units by mouth daily.   COMFORT LANCETS MISC    Use daily to get blood for glucometer   DOXAZOSIN (CARDURA) 4 MG TABLET    Take 1 tablet (4 mg total) by mouth daily.   FERROUS SULFATE 325 (65 FE) MG EC TABLET    Take 325 mg by mouth 2 (two) times daily.   FLUTICASONE FUROATE-VILANTEROL (BREO ELLIPTA) 100-25 MCG/INH AEPB    Inhale into the lungs once daily   FUROSEMIDE (LASIX) 40 MG TABLET  Take 1 tablet (40 mg total) by mouth daily.   GLIMEPIRIDE (AMARYL) 2 MG TABLET    Take one tablet by mouth once daily at breakfast to control glucose   GLUCOSE BLOOD (BAYER CONTOUR TEST) TEST STRIP    Use daily too check glucose Dx: E11.9   KETOTIFEN FUMARATE (ALLERGY EYE DROPS OP)    Place 1 drop into both eyes 3 (three) times daily as needed (allergies).    LEVOTHYROXINE (SYNTHROID, LEVOTHROID) 88 MCG TABLET    Take 1 tablet (88 mcg total) by mouth daily.   LISINOPRIL (PRINIVIL,ZESTRIL) 5 MG TABLET    Take 1 tablet (5 mg total) by mouth daily.   MIRABEGRON ER (MYRBETRIQ) 25 MG TB24 TABLET    One daily to help bladder control   PANTOPRAZOLE (PROTONIX) 40 MG TABLET    TAKE 1 TABLET BY MOUTH TWO  TIMES DAILY   POTASSIUM GLUCONATE 595 (99 K) MG TABS TABLET    Take 595 mg by mouth daily. TAKES ONLY WHEN USING FUROSEMIDE   PRAVASTATIN (PRAVACHOL) 20 MG TABLET    TAKE 1 TABLET BY MOUTH  DAILY   TRIAMCINOLONE (KENALOG) 0.025 % CREAM    Apply 1 application topically daily as needed (psoriasis).  Modified Medications   No medications on file  Discontinued Medications   No medications on file     Physical Exam:  Vitals:   08/24/17 1018  BP: 132/82  Pulse: 62  Resp: 18  Temp: 97.9 F (36.6 C)  TempSrc:  Oral  SpO2: 95%  Weight: 229 lb (103.9 kg)  Height: _0  (1.6 m)   Body mass index is 40.57 kg/m.  Physical Exam  Constitutional: She appears well-developed and well-nourished.  HENT:  Right Ear: Tympanic membrane, external ear and ear canal normal.  Left Ear: Tympanic membrane, external ear and ear canal normal.    Labs reviewed: Basic Metabolic Panel:  Recent Labs  09/12/16 0023  10/15/16 0400  03/26/17 0846 06/11/17 0832 08/06/17 0840  NA  --   < > 138  < > 140 139 138  K  --   < > 4.6  < > 4.6 4.7 4.7  CL  --   < > 101  < > 100 101 102  CO2  --   < > 30  < > _1 GLUCOSE  --   < > 118*  < > 151* 143* 157*  BUN  --   < > 23*  < > 26* 30* 43*  CREATININE  --   < > 1.24*  < > 1.50* 1.30* 1.45*  CALCIUM  --   < > 8.8*  < > 8.7 9.2 9.2  MG  --   --  1.9  --   --   --   --   TSH 1.297  --   --   --   --  8.98* 7.92*  < > = values in this interval not displayed. Liver Function Tests:  Recent Labs  02/18/17 1043 03/11/17 1448 06/11/17 0832  AST 32 30 17  ALT 42* 34 20  ALKPHOS 65 52 56  BILITOT 0.2 0.4 0.3  PROT 7.0 6.6 6.6  ALBUMIN 4.0 3.5 3.8    Recent Labs  12/23/16 0935  LIPASE 34  AMYLASE 38   No results for input(s): AMMONIA in the last 8760 hours. CBC:  Recent Labs  03/16/17 0930 04/06/17 0941 08/06/17 0840  WBC 5.0 5.2 5.9  NEUTROABS 3,450 3,328 3,634  HGB  8.3* 9.1* 9.5*  HCT 26.0* 29.2* 29.5*  MCV 88.7 90.1 83.6  PLT 255 251 199   Lipid Panel:  Recent Labs  06/11/17 0832  CHOL 158  HDL 63  LDLCALC Patricia  TRIG 97  CHOLHDL 2.5   TSH:  Recent Labs  09/12/16 0023 06/11/17 0832 08/06/17 0840  TSH 1.297 8.98* 7.92*   A1C: Lab Results  Component Value Date   HGBA1C 5.8 (H) 04/06/2017     Assessment/Plan 1. Impacted cerumen of left ear Resolved after using debrox  Next appt: as scheduled Ivianna Notch K. Harle Battiest  [facility] [phone] 8 am - 5 pm) [phone] (after  hours)

## 2017-09-07 ENCOUNTER — Inpatient Hospital Stay: Admission: RE | Admit: 2017-09-07 | Payer: Medicare Other | Source: Ambulatory Visit

## 2017-09-10 ENCOUNTER — Ambulatory Visit: Payer: Medicare Other | Admitting: Internal Medicine

## 2017-09-30 ENCOUNTER — Telehealth: Payer: Self-pay | Admitting: Nurse Practitioner

## 2017-09-30 NOTE — Telephone Encounter (Signed)
Left msg asking pt to call me at [phone] to schedule AWV-S w/ nurse. VDM (DD)

## 2017-10-08 ENCOUNTER — Ambulatory Visit (INDEPENDENT_AMBULATORY_CARE_PROVIDER_SITE_OTHER)
Admission: RE | Admit: 2017-10-08 | Discharge: 2017-10-08 | Disposition: A | Discharge status: Home / Self Care | Payer: Medicare Other | Source: Ambulatory Visit | Attending: Adult Health | Admitting: Adult Health

## 2017-10-08 DIAGNOSIS — R911 Solitary pulmonary nodule: Secondary | ICD-10-CM | POA: Diagnosis not present

## 2017-10-12 DIAGNOSIS — H10413 Chronic giant papillary conjunctivitis, bilateral: Secondary | ICD-10-CM | POA: Diagnosis not present

## 2017-10-12 DIAGNOSIS — H2513 Age-related nuclear cataract, bilateral: Secondary | ICD-10-CM | POA: Diagnosis not present

## 2017-10-12 DIAGNOSIS — E113293 Type 2 diabetes mellitus with mild nonproliferative diabetic retinopathy without macular edema, bilateral: Secondary | ICD-10-CM | POA: Diagnosis not present

## 2017-10-12 DIAGNOSIS — H35033 Hypertensive retinopathy, bilateral: Secondary | ICD-10-CM | POA: Diagnosis not present

## 2017-10-13 NOTE — Progress Notes (Signed)
[facility]                                       Cardiology Office Note    Date:  10/14/2017   ID:  Patricia [name], DOB [birth year], MRN [id number]  PCP:  [name], NP  Cardiologist:  Dr. Martinique / Dr. Burt Knack & Dr. Roxy Manns (TAVR)  CC: 1 year follow up s/p TAVR  History of Present Illness:  Patricia [name] is a 81 y.o. female with a history of PAF on Eliquis, COPD, HTN, HLD, anemia, GI bleeds 2/2 AVMS and severe AS s/p TAVR (09/2016) who presents to clinic for follow up.   She underwent TAVR with a 23 mm TAVR valve on 10/14/16. Post op course was uncomplicated. Repeat Echo in January showed higher than expected AV prosthesis gradient (peak and mean gradients are 53 and 28 mmHg) felt to be related to vigorous LV systolic function and valve/ patient mismatch. She was continued on Eliquis and ASA 81 mg daily was added. She has noted moderate improvement in her breathing.   Since her procedure she was admitted in 12/2016 acute blood loss anemia. She was transfused and EGD showed multiple gastric polyps and some oozing AVMs that were cauterized. Once again she was admitted in May with worsening anemia. 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 Transfused 1 units PRBCs. Plan for outpatient capsule endoscopy. Lisinopril was stopped in the hospital due to renal function.  Today she presents to clinic for follow up. She hasn't been feeling good recently. She has had worsening shortness of breath and fatigue. She gets short of breath with daily activities such as walking around on flat ground. No CP. No LE edema, orthopnea or PND. No dizziness or syncope. No blood in stool or urine. No palpitations.     Past Medical History:  Diagnosis Date  . Allergy   . Anemia, unspecified   . Arthritis   . Asthma   . Atrial fibrillation status post cardioversion [facility]) 09/2015   s/p TEE/DCCV>>SR on amio  . Benign neoplasm of colon   .  Carpal tunnel syndrome   . Cellulitis and abscess of finger, unspecified   . Cerumen impaction   . Cervicalgia   . CHF (congestive heart failure) ([place])   . Chronic airway obstruction, not elsewhere classified   . Chronic anticoagulation 09/2015   Xarelto for afib, CHADS2VASC=5  . Diarrhea   . Diffuse cystic mastopathy   . Dysrhythmia   . Edema   . Encounter for long-term (current) use of other medications   . GERD (gastroesophageal reflux disease)   . Heart murmur   . Lumbago   . Neck pain 05/23/2015  . Obesity, unspecified   . Other and unspecified hyperlipidemia   . Other psoriasis   . Pain in joint, lower leg   . PONV (postoperative nausea and vomiting)   . Primary localized osteoarthrosis of right shoulder 12/18/2015  . Routine gynecological examination   . 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 Scoliosis (and kyphoscoliosis), idiopathic   . Severe aortic stenosis   . Shortness of breath dyspnea    with exertion  . Type II or unspecified type diabetes mellitus without mention of complication, uncontrolled   .  Unspecified essential hypertension   . Unspecified hemorrhoids without mention of complication   . Unspecified hypothyroidism   . Urge incontinence     Past Surgical History:  Procedure Laterality Date  . ABDOMINAL HYSTERECTOMY  1987   complete  . BREAST BIOPSY Right   . BREAST EXCISIONAL BIOPSY Left 2011  . BREAST EXCISIONAL BIOPSY Right    x2  . BREAST SURGERY     right breast 3 surgeries [phone]  . CARDIAC CATHETERIZATION N/A 09/02/2016   Procedure: Right/Left Heart Cath and Coronary Angiography;  Surgeon: [name] M Martinique, MD;  Location: [place] [facility];  Service: Cardiovascular;  Laterality: N/A;  . CARDIOVERSION N/A 10/19/2015   Procedure: CARDIOVERSION;  Surgeon: Lelon Perla, MD;  Location: [place];  Service:  Cardiovascular;  Laterality: N/A;  . [place]  . COLON SURGERY     2004,2007,2013.  3 times colon surgieres  . ESOPHAGOGASTRODUODENOSCOPY (EGD) WITH PROPOFOL Left 03/13/2017   Procedure: ESOPHAGOGASTRODUODENOSCOPY (EGD) WITH PROPOFOL;  Surgeon: Ronnette Juniper, MD;  Location: [place];  Service: Gastroenterology;  Laterality: Left;  . EXCISE LE MANDIBULAR LYMPH NODE T     DR C. NEWMAN  . FOOT SURGERY  1989  . LEFT SHOULDER ARTHROSCOPY    . 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 NEUROPLASTY / TRANSPOSITION MEDIAN NERVE AT CARPAL TUNNEL BILATERAL  05/2003  . TEE WITHOUT CARDIOVERSION N/A 10/19/2015   Procedure: Transesophageal Echocardiogram (TEE) ;  Surgeon: Lelon Perla, MD;  Location: [facility];  Service: Cardiovascular;  Laterality: N/A;  . TEE WITHOUT CARDIOVERSION N/A 10/14/2016   Procedure: TRANSESOPHAGEAL ECHOCARDIOGRAM (TEE);  Surgeon: Sherren Mocha, MD;  Location: [place];  Service: Open Heart Surgery;  Laterality: N/A;  . TOTAL SHOULDER ARTHROPLASTY Right 12/18/2015   Procedure: TOTAL SHOULDER ARTHROPLASTY;  Surgeon: [name], MD;  Location: [place];  Service: Orthopedics;  Laterality: Right;  . TRANSCATHETER AORTIC VALVE REPLACEMENT, TRANSFEMORAL N/A 10/14/2016   Procedure: TRANSCATHETER AORTIC VALVE REPLACEMENT, TRANSFEMORAL;  Surgeon: Sherren Mocha, MD;  Location: [place];  Service: Open Heart Surgery;  Laterality: N/A;  . TRIGGER FINGER RELEASE Left   . VAGINAL CYST REMOVED  1967    Current Medications: Outpatient Medications Prior to Visit  Medication Sig Dispense Refill  . acetaminophen (TYLENOL) 500 MG tablet Take 500 mg by mouth every 6 (six) hours as needed for mild pain.    Marland Kitchen albuterol (PROVENTIL) (2.5 MG/3ML) 0.083% nebulizer solution Take 3 mLs (2.5 mg total) by nebulization every 4 (four) hours as needed for wheezing or shortness of breath.    Marland Kitchen albuterol (VENTOLIN HFA) 108 (90 Base) MCG/ACT inhaler Inhale 2 puffs into the lungs every 6  (six) hours as needed for wheezing or shortness of breath. 1 Inhaler 11  . amiodarone (PACERONE) 200 MG tablet Take 0.5 tablets (100 mg total) by mouth daily. Take one tablet daily by mouth 90 tablet 3  . apixaban (ELIQUIS) 5 MG TABS tablet Take 1 tablet (5 mg total) by mouth 2 (two) times daily. 180 tablet 1  . cetirizine (ZYRTEC) 10 MG tablet Take 10 mg by mouth daily as needed for allergies.     . cholecalciferol (VITAMIN D) 1000 units tablet Take 1,000 Units by mouth daily.    . COMFORT LANCETS MISC Use daily to get blood for glucometer 100 each 3  . doxazosin (CARDURA) 4 MG tablet Take 1 tablet (4 mg total) by mouth daily. 30 tablet 0  . ferrous sulfate 325 (65 FE) MG  EC tablet Take 325 mg by mouth 2 (two) times daily.    . fluticasone furoate-vilanterol (BREO ELLIPTA) 100-25 MCG/INH AEPB Inhale into the lungs once daily 1 each 4  . furosemide (LASIX) 40 MG tablet Take 1 tablet (40 mg total) by mouth daily. 90 tablet 1  . glimepiride (AMARYL) 2 MG tablet Take one tablet by mouth once daily at breakfast to control glucose 90 tablet 1  . glucose blood (BAYER CONTOUR TEST) test strip Use daily too check glucose Dx: E11.9 100 each 4  . Ketotifen Fumarate (ALLERGY EYE DROPS OP) Place 1 drop into both eyes 3 (three) times daily as needed (allergies).     Marland Kitchen levothyroxine (SYNTHROID, LEVOTHROID) 88 MCG tablet Take 1 tablet (88 mcg total) by mouth daily. 90 tablet 3  . lisinopril (PRINIVIL,ZESTRIL) 5 MG tablet Take 1 tablet (5 mg total) by mouth daily. 90 tablet 3  . mirabegron ER (MYRBETRIQ) 25 MG TB24 tablet One daily to help bladder control 90 tablet 5  . pantoprazole (PROTONIX) 40 MG tablet TAKE 1 TABLET BY MOUTH TWO  TIMES DAILY 180 tablet 1  . potassium gluconate 595 (99 K) MG TABS tablet Take 595 mg by mouth daily. TAKES ONLY WHEN USING FUROSEMIDE    . pravastatin (PRAVACHOL) 20 MG tablet TAKE 1 TABLET BY MOUTH  DAILY 90 tablet 1  . triamcinolone (KENALOG) 0.025 % cream Apply 1 application  topically daily as needed (psoriasis). 30 g 0   No facility-administered medications prior to visit.      Allergies:   Codeine and Vesicare [solifenacin]   Social History   Socioeconomic History  . Marital status: Divorced    Spouse name: None  . Number of children: 1  . Years of education: None  . Highest education level: None  Social Needs  . Financial resource strain: None  . Food insecurity - worry: None  . Food insecurity - inability: None  . Transportation needs - medical: None  . Transportation needs - non-medical: None  Occupational History  . Occupation: Retired Market researcher for [employer]  Tobacco Use  . Smoking status: Former Smoker    Packs/day: 1.00    Years: 40.00    Pack years: 40.00    Last attempt to quit: 09/07/1989    Years since quitting: 28.1  . Smokeless tobacco: Never Used  Substance and Sexual Activity  . Alcohol use: No    Alcohol/week: 0.0 oz  . Drug use: No  . Sexual activity: No  Other Topics Concern  . None  Social History Narrative   Her daughter & 3 grandchildren live in the area.       Family History:  The patient's family history includes Arthritis in her sister; Asthma in her sister; Diabetes in her father; Heart disease in her brother and father; Liver cancer in her sister; Stroke in her father.      ROS:   Please see the history of present illness.    ROS All other systems reviewed and are negative.   PHYSICAL EXAM:   VS:  BP (!) 157/74 Comment: @ today's echo appt  Pulse 78   Ht _0  (1.6 m)   Wt 212 lb 12 oz (96.5 kg)   SpO2 95%   BMI 37.69 kg/m    GEN: Well nourished, well developed, in no acute distress, obese  HEENT: normal  Neck: no JVD, carotid bruits, or masses Cardiac: RRR; 2/6 SEM _1 . No rubs, or gallops,no edema  Respiratory:  clear  to auscultation bilaterally, normal work of breathing GI: soft, nontender, nondistended, + BS MS: no deformity or atrophy  Skin: warm and dry, no rash Neuro:   Alert and Oriented x 3, Strength and sensation are intact Psych: euthymic mood, full affect   Wt Readings from Last 3 Encounters:  10/14/17 212 lb 12 oz (96.5 kg)  08/24/17 229 lb (103.9 kg)  08/19/17 227 lb (103 kg)      Studies/Labs Reviewed:   EKG:  EKG is NOT ordered today.   Recent Labs: 10/15/2016: Magnesium 1.9 06/11/2017: ALT 20 08/06/2017: BUN 43; Creat 1.45; Hemoglobin 9.5; Platelets 199; Potassium 4.7; Sodium 138; TSH 7.92   Lipid Panel    Component Value Date/Time   CHOL 158 06/11/2017 0832   CHOL 182 04/28/2016 0940   TRIG 97 06/11/2017 0832   HDL 63 06/11/2017 0832   HDL 61 04/28/2016 0940   CHOLHDL 2.5 06/11/2017 0832   VLDL 19 06/11/2017 0832   LDLCALC 76 06/11/2017 0832   LDLCALC 86 04/28/2016 0940    Additional studies/ records that were reviewed today include:  2D ECHO: 11/24/16 (1 month s/p TAVR) Study Conclusions - Left ventricle: The cavity size was normal. Wall thickness was   increased in a pattern of mild LVH. There was mild focal basal   hypertrophy of the septum. Systolic function was vigorous. The   estimated ejection fraction was in the range of 65% to 70%. Wall   motion was normal; there were no regional wall motion   abnormalities. Features are consistent with a pseudonormal left   ventricular filling pattern, with concomitant abnormal relaxation   and increased filling pressure (grade 2 diastolic dysfunction).   Doppler parameters are consistent with high ventricular filling   pressure. - Aortic valve: A bioprosthesis was present. - Mitral valve: Severely calcified annulus. Mildly thickened   leaflets . There was mild regurgitation. - Left atrium: The atrium was severely dilated. - Right atrium: The atrium was mildly dilated. - Pulmonary arteries: Systolic pressure was moderately increased.   PA peak pressure: 46 mm Hg (S). Impressions: - Vigorous LV systolic function; grade 2 diastolic dysfunction;   elevated LV filling pressure;  mild LVH; s/p TAVR with elevated   mean gradient (29 mmHg); no AI; mild MR; severe LAE; mild RAE;   mild TR with moderately elevated pulmonary pressure.   2D ECHO: 10/14/17 (1 year s/p TAVR) Study Conclusions - Left ventricle: The cavity size was normal. Wall thickness was   increased in a pattern of moderate LVH. Systolic function was   normal. The estimated ejection fraction was in the range of 60%   to 65%. Wall motion was normal; there were no regional wall   motion abnormalities. Diastolic dysfunction with elevated LV   filling pressure. - Aortic valve: s/p TAVR. No obstruction or paravalvular leak. Mean   gradient (S): 24 mm Hg. Peak gradient (S): 47 mm Hg. Valve area   (VTI): 1.19 cm^2. Valve area (Vmax): 1.09 cm^2. Valve area   (Vmean): 1.07 cm^2. - Mitral valve: Calcified annulus. Mildly thickened leaflets . Mild   stenosis. There was moderate regurgitation. - Left atrium: Severely dilated. - Right atrium: Mildly dilated. - Tricuspid valve: There was mild regurgitation. - Pulmonary arteries: PA peak pressure: 51 mm Hg (S). - Inferior vena cava: The vessel was normal in size. The   respirophasic diameter changes were in the normal range (>= 50%),   consistent with normal central venous pressure. Impressions: - Compared to a prior study  in 10/2016, the TAVR valve appears   stable.  ASSESSMENT & PLAN:   Severe AS s/p TAVR: Repeat Echo in 10/2016 showed higher than expected AV prosthesis gradient (peak and mean gradients are 53 and 28 mmHg) felt to be related to vigorous LV systolic function and valve/patient mismatch. 2D ECHO today shows stable TAVR valve with elevated gradients mildly improved from previous study (peak and mean 47 and 24 mm Hg). She has NHYA class II-III symptoms. She has had worsening of her fatigue and SOB over the past couple months. I feel like this is likely multifactorial 2/2 to COPD, obesity and deconditioning. Will check CBC given history of anemia and  AVMs and BMET/BNP to rule out occult CHF. She is interested in working with cardiac rehab again as she had to stop early due to anemia. I will check to see if this is possible. SBE prophylaxis discussed. Continue regular follow up with Dr. Martinique.   PAF: sounds regular on exam today. Tracings on echocardiogram show sinus rhythm. Continue amiodarone and Eliquis.   Anemia: she has had multiple admissions for GI bleeding 2/2 AVMs. These were treated. Hg was 9.5 back in 07/2017. I will check a CBC today given worsening fatigue and SOB.   CKD: baseline creat ~ 1.3-1.5. Will check a BMET day   Chronic diastolic CHF: appears euvolemic. Check BNP to rule out occult CHF given worsening SOB. Continue current lasix dosing for now.  HTN: BP elevated today. She says she has a long history of white coat HTN and that it runs normal at home. No changes made  Hypothyroidism: TSH was elevated in 07/2017 and synthroid was increased. This is followed by her PCP.   Medication Adjustments/Labs and Tests Ordered: Current medicines are reviewed at length with the patient today.  Concerns regarding medicines are outlined above.  Medication changes, Labs and Tests ordered today are listed in the Patient Instructions below. Patient Instructions  Medication Instructions:  Your physician recommends that you continue on your current medications as directed. Please refer to the Current Medication list given to you today.   Labwork: Lab work to be done today--CBC, BMP, BNP  Testing/Procedures: none  Follow-Up: Follow up with Dr. Martinique as planned on 12/07/17  Any Other Special Instructions Will Be Listed Below (If Applicable).  Your physician discussed the importance of taking an antibiotic prior to any dental, gastrointestinal, genitourinary procedures to prevent damage to the heart valves from infection.       If you need a refill on your cardiac medications before your next appointment, please call your  pharmacy.      Signed, Angelena Form, PA-C  10/14/2017 3:42 PM    [facility] [place], [place], [place]  [address] Phone: [phone]; Fax: [phone]

## 2017-10-14 ENCOUNTER — Other Ambulatory Visit: Payer: Self-pay

## 2017-10-14 ENCOUNTER — Encounter: Payer: Self-pay | Admitting: Physician Assistant

## 2017-10-14 ENCOUNTER — Ambulatory Visit (HOSPITAL_COMMUNITY): Payer: Medicare Other | Attending: Internal Medicine

## 2017-10-14 ENCOUNTER — Ambulatory Visit (INDEPENDENT_AMBULATORY_CARE_PROVIDER_SITE_OTHER): Payer: Medicare Other | Admitting: Physician Assistant

## 2017-10-14 VITALS — BP 157/74 | HR 78 | Ht 63.0 in | Wt 212.8 lb

## 2017-10-14 DIAGNOSIS — I350 Nonrheumatic aortic (valve) stenosis: Secondary | ICD-10-CM | POA: Diagnosis not present

## 2017-10-14 DIAGNOSIS — I420 Dilated cardiomyopathy: Secondary | ICD-10-CM | POA: Insufficient documentation

## 2017-10-14 DIAGNOSIS — Z952 Presence of prosthetic heart valve: Secondary | ICD-10-CM | POA: Diagnosis not present

## 2017-10-14 DIAGNOSIS — D649 Anemia, unspecified: Secondary | ICD-10-CM

## 2017-10-14 DIAGNOSIS — I081 Rheumatic disorders of both mitral and tricuspid valves: Secondary | ICD-10-CM | POA: Diagnosis not present

## 2017-10-14 DIAGNOSIS — N189 Chronic kidney disease, unspecified: Secondary | ICD-10-CM | POA: Diagnosis not present

## 2017-10-14 DIAGNOSIS — I10 Essential (primary) hypertension: Secondary | ICD-10-CM

## 2017-10-14 DIAGNOSIS — I480 Paroxysmal atrial fibrillation: Secondary | ICD-10-CM

## 2017-10-14 DIAGNOSIS — E039 Hypothyroidism, unspecified: Secondary | ICD-10-CM

## 2017-10-14 DIAGNOSIS — I5032 Chronic diastolic (congestive) heart failure: Secondary | ICD-10-CM | POA: Diagnosis not present

## 2017-10-14 NOTE — Patient Instructions (Signed)
Medication Instructions:  Your physician recommends that you continue on your current medications as directed. Please refer to the Current Medication list given to you today.   Labwork: Lab work to be done today--CBC, BMP, BNP  Testing/Procedures: none  Follow-Up: Follow up with Dr. Martinique as planned on 12/07/17  Any Other Special Instructions Will Be Listed Below (If Applicable).  Your physician discussed the importance of taking an antibiotic prior to any dental, gastrointestinal, genitourinary procedures to prevent damage to the heart valves from infection.       If you need a refill on your cardiac medications before your next appointment, please call your pharmacy.

## 2017-10-15 ENCOUNTER — Ambulatory Visit (INDEPENDENT_AMBULATORY_CARE_PROVIDER_SITE_OTHER): Payer: Medicare Other | Admitting: Internal Medicine

## 2017-10-15 ENCOUNTER — Encounter: Payer: Self-pay | Admitting: Internal Medicine

## 2017-10-15 ENCOUNTER — Other Ambulatory Visit: Payer: Self-pay | Admitting: *Deleted

## 2017-10-15 ENCOUNTER — Telehealth: Payer: Self-pay | Admitting: Internal Medicine

## 2017-10-15 VITALS — BP 132/68 | HR 73 | Ht 63.0 in | Wt 230.0 lb

## 2017-10-15 DIAGNOSIS — R911 Solitary pulmonary nodule: Secondary | ICD-10-CM | POA: Diagnosis not present

## 2017-10-15 DIAGNOSIS — R0600 Dyspnea, unspecified: Secondary | ICD-10-CM

## 2017-10-15 DIAGNOSIS — I5082 Biventricular heart failure: Secondary | ICD-10-CM | POA: Diagnosis not present

## 2017-10-15 DIAGNOSIS — R0609 Other forms of dyspnea: Secondary | ICD-10-CM

## 2017-10-15 DIAGNOSIS — I5032 Chronic diastolic (congestive) heart failure: Secondary | ICD-10-CM

## 2017-10-15 DIAGNOSIS — J439 Emphysema, unspecified: Secondary | ICD-10-CM | POA: Diagnosis not present

## 2017-10-15 LAB — CBC
Hematocrit: 31.2 % — ABNORMAL LOW (ref 34.0–46.6)
Hemoglobin: 9.7 g/dL — ABNORMAL LOW (ref 11.1–15.9)
MCH: 27.1 pg (ref 26.6–33.0)
MCHC: 31.1 g/dL — ABNORMAL LOW (ref 31.5–35.7)
MCV: 87 fL (ref 79–97)
Platelets: 220 10*3/uL (ref 150–379)
RBC: 3.58 x10E6/uL — ABNORMAL LOW (ref 3.77–5.28)
RDW: 17.5 % — ABNORMAL HIGH (ref 12.3–15.4)
WBC: 6 10*3/uL (ref 3.4–10.8)

## 2017-10-15 LAB — BASIC METABOLIC PANEL
BUN/Creatinine Ratio: 21 (ref 12–28)
BUN: 31 mg/dL — ABNORMAL HIGH (ref 8–27)
CO2: 26 mmol/L (ref 20–29)
Calcium: 9.4 mg/dL (ref 8.7–10.3)
Chloride: 100 mmol/L (ref 96–106)
Creatinine, Ser: 1.51 mg/dL — ABNORMAL HIGH (ref 0.57–1.00)
GFR calc Af Amer: 38 mL/min/{1.73_m2} — ABNORMAL LOW (ref >59)
GFR calc non Af Amer: 33 mL/min/{1.73_m2} — ABNORMAL LOW (ref >59)
Glucose: 112 mg/dL — ABNORMAL HIGH (ref 65–99)
Potassium: 4.2 mmol/L (ref 3.5–5.2)
Sodium: 141 mmol/L (ref 134–144)

## 2017-10-15 LAB — PRO B NATRIURETIC PEPTIDE: NT-Pro BNP: 2102 pg/mL — ABNORMAL HIGH (ref 0–738)

## 2017-10-15 NOTE — Telephone Encounter (Signed)
lisinopril 12m daily

## 2017-10-15 NOTE — Patient Instructions (Addendum)
Solitary pulmonary nodule  - resolved On CT dec 2018  Pulmonary emphysema, unspecified emphysema type ([place]) v ASthma - cotninue breo - will send message to dr Martinique to take you  Off ramipril to see if that can help wheezing - refer  Pulmonary rehab  Chronic congestive heart failure with right ventricular diastolic dysfunction ([place]) - I Think this is why you have decompensation past few weeks  - management per cardiology  Shortness of breath   - is due to lung, heart and weight issues  - refer pulmonary rehab  Followup 3 months or sooner if needed

## 2017-10-15 NOTE — Addendum Note (Signed)
Addended by: Lorretta Harp on: 10/15/2017 12:32 PM   Modules accepted: Orders

## 2017-10-15 NOTE — Progress Notes (Signed)
bm

## 2017-10-15 NOTE — Telephone Encounter (Signed)
Hi Patricia [name]  Is it it possible to gether off ace inhibitor. She has some wheezing  THanks  Dr. Brand Males, M.D., [facility].C.P Pulmonary and Critical Care Medicine Staff Physician, [place] Director - Interstitial Lung Disease  Program  Pulmonary [place] at [place], [place], [address]  Pager: [phone], If no answer or between  15:00h - 7:00h: call [phone] Telephone: [phone]

## 2017-10-15 NOTE — Telephone Encounter (Signed)
Yes I think we can. She is on a very low dose. If later we need therapy for HTN we could use an ARB  Alica Shellhammer Martinique MD, [facility]

## 2017-10-15 NOTE — Telephone Encounter (Signed)
Dr. Chase Caller,   I called patient to advise her of your suggestions, and she is stating that she is not taking a medication by this name or the generic for it. Could it be another medication that was meant? Please advise

## 2017-10-15 NOTE — Telephone Encounter (Signed)
[name]: please tell Patricia [name] to stop ramipril based on my discussions with dr Martinique and cards will monitor BP / Ensure first avail fu with cards  Collier [place]: thanks  Both: reply not needed.

## 2017-10-15 NOTE — Progress Notes (Signed)
Subjective:     Patient ID: Patricia [name], female   DOB: 03/25/1936, 81 y.o.   MRN: [id number]  HPI   OV dec 2017 This is the case of Patricia [name], 81 y.o. Female, who Is being seen today for shortness of breath.  As you very well know, patient  is a 81 year old obese white female with history of aortic stenosis, hypertension, atrial fibrillation, coronary artery disease, chronic diastolic congestive heart failure, acute on chronic hypoxic respiratory failure, hyperlipidemia, and GI bleeding with anemia.  She recently had a TAVR a week ago.   Pt had a 47 PY smoking history, quit when she was 81 yrs old. Pt was diagnosed with adult asthma. Triggers: strong odors, pollen, change in weather. Has been on advair 100/50 1P BID for a while. Also on alb as needed.   She was started on O2 2L 24/7 during Thanksgiving admission for SOB.   Pt's SOB is some better after having TAVR. She continues to have episodic SOB.   Pt has snoring, occasional gasping or choking. Has witnessed apneas.  Has hypersomnia, mild. Naps daily.  Hypersomnia affects her fxnality.   ......................................................................Marland Kitchen  ROV 01/26/17 Patient returns to the office as follow-up on her dyspnea. Since last seen, she is overall improved. She barely uses her oxygen now during the daytime. She uses oxygen at bedtime. She checks her O2 saturation and it usually runs in the mid 90s on room air with exertion. Her asthma has also been stable. She is finishing her Advair and will switch to [employer].  because of insurance coverage.   OV 10/15/2017  Chief Complaint  Patient presents with  . Follow-up    Former [employer] pt.  CT scan done 12/13 and is here to go over the results.   Pt states things have been bad since last visit. Pt has had numerous flare-ups with her asthma. Has complaints of SOB and chest discomfort.    81 year old obese female who is to be followed by Dr. DDS for dyspnea that was  believed to be due to obesity, diastolic dysfunction aortic valve stenosis and asthma versus emphysema.  She also has a 5 mm left upper lobe nodule.  There is a history of hypersomnia  Patient presents for transfer of care after Dr. DDS left the practice.  A year ago she had T AVR procedure for aortic valve stenosis.  After that her dyspnea improved.  However now for the last 1 month her dyspnea is worse but there is no increased cough.  She has an occasional wheezing.  She did see cardiology recently and her BNP is high and increase diuresis has been called in.  She had an echocardiogram I reviewed the chart.  It shows diastolic dysfunction.  She continues to be on Brio for her reported diagnosis of asthma although she is unable to do exam nitric oxide today with [place].  She did have a CT scan of the chest this month 2018 that I personally visualized.  The 5 mm left upper lobe nodule has resolved.  There is no evidence of ILD although this is non-high-resolution CT chest.  She does have some emphysema.  She continues to be on Brio which states works well for her.  I noted that she is on ACE inhibitor  Results for TEESHA, OHM (MRN [id number]) as of 10/15/2017 12:07  Ref. Range 09/16/2016 14:00  FEV1-Pre Latest Units: L 1.36  FEV1-%Pred-Pre Latest Units: % 74  Pre FEV1/FVC ratio Latest Units: %  61  Results for SHALUNDA, LINDH (MRN [id number]) as of 10/15/2017 12:07  Ref. Range 09/16/2016 14:00  TLC Latest Units: L 3.59  TLC % pred Latest Units: % 75  Results for JAYDALYNN, OLIVERO (MRN [id number]) as of 10/15/2017 12:07  Ref. Range 09/16/2016 14:00  DLCO unc Latest Units: ml/min/mmHg 12.73  DLCO unc % pred Latest Units: % 59  IMPRESSION: 1. 5 mm nodule seen previously in the lingula has resolved in the interval. 2.  Emphysema. (ICD10-J43.9) 3.  Aortic Atherosclerois (ICD10-170.0)   Electronically Signed   By: Misty Stanley M.D.   On: 10/08/2017 15:55   has a past medical history of Allergy,  Anemia, unspecified, Arthritis, Asthma, Atrial fibrillation status post cardioversion [facility]) (09/2015), Benign neoplasm of colon, Carpal tunnel syndrome, Cellulitis and abscess of finger, unspecified, Cerumen impaction, Cervicalgia, CHF (congestive heart failure) ([place]), Chronic airway obstruction, not elsewhere classified, Chronic anticoagulation (09/2015), Diarrhea, Diffuse cystic mastopathy, Dysrhythmia, Edema, Encounter for long-term (current) use of other medications, GERD (gastroesophageal reflux disease), Heart murmur, Lumbago, Neck pain (05/23/2015), Obesity, unspecified, Other and unspecified hyperlipidemia, Other psoriasis, Pain in joint, lower leg, PONV (postoperative nausea and vomiting), Primary localized osteoarthrosis of right shoulder (12/18/2015), Routine gynecological examination, S/P TAVR (transcatheter aortic valve replacement) (10/14/2016), Scoliosis (and kyphoscoliosis), idiopathic, Severe aortic stenosis, Shortness of breath dyspnea, Type II or unspecified type diabetes mellitus without mention of complication, uncontrolled, Unspecified essential hypertension, Unspecified hemorrhoids without mention of complication, Unspecified hypothyroidism, and Urge incontinence.   reports that she quit smoking about 28 years ago. She has a 40.00 pack-year smoking history. she has never used smokeless tobacco.  Past Surgical History:  Procedure Laterality Date  . ABDOMINAL HYSTERECTOMY  1987   complete  . BREAST BIOPSY Right   . BREAST EXCISIONAL BIOPSY Left 2011  . BREAST EXCISIONAL BIOPSY Right    x2  . BREAST SURGERY     right breast 3 surgeries [phone]  . CARDIAC CATHETERIZATION N/A 09/02/2016   Procedure: Right/Left Heart Cath and Coronary Angiography;  Surgeon: [name] M Martinique, MD;  Location: [facility];  Service: Cardiovascular;  Laterality: N/A;  . CARDIOVERSION N/A 10/19/2015   Procedure: CARDIOVERSION;  Surgeon: Lelon Perla, MD;  Location: [facility];  Service:  Cardiovascular;  Laterality: N/A;  . [place]  . COLON SURGERY     2004,2007,2013.  3 times colon surgieres  . ESOPHAGOGASTRODUODENOSCOPY (EGD) WITH PROPOFOL Left 03/13/2017   Procedure: ESOPHAGOGASTRODUODENOSCOPY (EGD) WITH PROPOFOL;  Surgeon: Ronnette Juniper, MD;  Location: [place];  Service: Gastroenterology;  Laterality: Left;  . EXCISE LE MANDIBULAR LYMPH NODE T     DR C. NEWMAN  . FOOT SURGERY  1989  . LEFT SHOULDER ARTHROSCOPY    . 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 NEUROPLASTY / TRANSPOSITION MEDIAN NERVE AT CARPAL TUNNEL BILATERAL  05/2003  . TEE WITHOUT CARDIOVERSION N/A 10/19/2015   Procedure: Transesophageal Echocardiogram (TEE) ;  Surgeon: Lelon Perla, MD;  Location: [facility];  Service: Cardiovascular;  Laterality: N/A;  . TEE WITHOUT CARDIOVERSION N/A 10/14/2016   Procedure: TRANSESOPHAGEAL ECHOCARDIOGRAM (TEE);  Surgeon: Sherren Mocha, MD;  Location: [place];  Service: Open Heart Surgery;  Laterality: N/A;  . TOTAL SHOULDER ARTHROPLASTY Right 12/18/2015   Procedure: TOTAL SHOULDER ARTHROPLASTY;  Surgeon: [name], MD;  Location: [facility];  Service: Orthopedics;  Laterality: Right;  . TRANSCATHETER AORTIC VALVE REPLACEMENT, TRANSFEMORAL N/A 10/14/2016   Procedure: TRANSCATHETER AORTIC VALVE REPLACEMENT, TRANSFEMORAL;  Surgeon: Sherren Mocha, MD;  Location[facility];  Service: Open Heart Surgery;  Laterality: N/A;  . TRIGGER FINGER RELEASE Left   . VAGINAL CYST REMOVED  1967    Allergies  Allergen Reactions  . Codeine Other (See Comments)    "Spaces out" the patient and she cannot walk well  . Vesicare [Solifenacin] Itching    Immunization History  Administered Date(s) Administered  . DTaP 08/03/2003  . Influenza, High Dose Seasonal PF 06/15/2017  . Influenza,inj,Quad PF,6+ Mos 09/29/2013, 07/19/2014, 09/26/2015, 09/13/2016  . Influenza-Unspecified 11/04/2012  . Pneumococcal Conjugate-13 09/26/2015  . Pneumococcal Polysaccharide-23  12/01/1993, 08/19/2017    Family History  Problem Relation Age of Onset  . Diabetes Father   . Stroke Father   . Heart disease Father   . Liver cancer Sister   . Heart disease Brother   . Asthma Sister   . Arthritis Sister      Current Outpatient Medications:  .  acetaminophen (TYLENOL) 500 MG tablet, Take 500 mg by mouth every 6 (six) hours as needed for mild pain., Disp: , Rfl:  .  albuterol (PROVENTIL) (2.5 MG/3ML) 0.083% nebulizer solution, Take 3 mLs (2.5 mg total) by nebulization every 4 (four) hours as needed for wheezing or shortness of breath., Disp: , Rfl:  .  albuterol (VENTOLIN HFA) 108 (90 Base) MCG/ACT inhaler, Inhale 2 puffs into the lungs every 6 (six) hours as needed for wheezing or shortness of breath., Disp: 1 Inhaler, Rfl: 11 .  amiodarone (PACERONE) 200 MG tablet, Take 0.5 tablets (100 mg total) by mouth daily. Take one tablet daily by mouth, Disp: 90 tablet, Rfl: 3 .  apixaban (ELIQUIS) 5 MG TABS tablet, Take 1 tablet (5 mg total) by mouth 2 (two) times daily., Disp: 180 tablet, Rfl: 1 .  cetirizine (ZYRTEC) 10 MG tablet, Take 10 mg by mouth daily as needed for allergies. , Disp: , Rfl:  .  cholecalciferol (VITAMIN D) 1000 units tablet, Take 1,000 Units by mouth daily., Disp: , Rfl:  .  COMFORT LANCETS MISC, Use daily to get blood for glucometer, Disp: 100 each, Rfl: 3 .  doxazosin (CARDURA) 4 MG tablet, Take 1 tablet (4 mg total) by mouth daily., Disp: 30 tablet, Rfl: 0 .  ferrous sulfate 325 (65 FE) MG EC tablet, Take 325 mg by mouth 2 (two) times daily., Disp: , Rfl:  .  fluticasone furoate-vilanterol (BREO ELLIPTA) 100-25 MCG/INH AEPB, Inhale into the lungs once daily, Disp: 1 each, Rfl: 4 .  furosemide (LASIX) 40 MG tablet, Take 1 tablet (40 mg total) by mouth daily., Disp: 90 tablet, Rfl: 1 .  glimepiride (AMARYL) 2 MG tablet, Take one tablet by mouth once daily at breakfast to control glucose, Disp: 90 tablet, Rfl: 1 .  glucose blood (BAYER CONTOUR TEST) test  strip, Use daily too check glucose Dx: E11.9, Disp: 100 each, Rfl: 4 .  Ketotifen Fumarate (ALLERGY EYE DROPS OP), Place 1 drop into both eyes 3 (three) times daily as needed (allergies). , Disp: , Rfl:  .  levothyroxine (SYNTHROID, LEVOTHROID) 88 MCG tablet, Take 1 tablet (88 mcg total) by mouth daily., Disp: 90 tablet, Rfl: 3 .  lisinopril (PRINIVIL,ZESTRIL) 5 MG tablet, Take 1 tablet (5 mg total) by mouth daily., Disp: 90 tablet, Rfl: 3 .  mirabegron ER (MYRBETRIQ) 25 MG TB24 tablet, One daily to help bladder control, Disp: 90 tablet, Rfl: 5 .  pantoprazole (PROTONIX) 40 MG tablet, TAKE 1 TABLET BY MOUTH TWO  TIMES DAILY, Disp: 180 tablet, Rfl: 1 .  potassium gluconate 595 (99 K) MG TABS tablet, Take 595 mg by mouth daily. TAKES ONLY WHEN USING FUROSEMIDE, Disp: , Rfl:  .  pravastatin (PRAVACHOL) 20 MG tablet, TAKE 1 TABLET BY MOUTH  DAILY, Disp: 90 tablet, Rfl: 1 .  triamcinolone (KENALOG) 0.025 % cream, Apply 1 application topically daily as needed (psoriasis)., Disp: 30 g, Rfl: 0    Review of Systems     Objective:   Physical Exam  Constitutional: She is oriented to person, place, and time. She appears well-developed and well-nourished. No distress.  obese  HENT:  Head: Normocephalic and atraumatic.  Right Ear: External ear normal.  Left Ear: External ear normal.  Mouth/Throat: Oropharynx is clear and moist. No oropharyngeal exudate.  Eyes: Conjunctivae and EOM are normal. Pupils are equal, round, and reactive to light. Right eye exhibits no discharge. Left eye exhibits no discharge. No scleral icterus.  Neck: Normal range of motion. Neck supple. No JVD present. No tracheal deviation present. No thyromegaly present.  Cardiovascular: Normal rate, regular rhythm, normal heart sounds and intact distal pulses. Exam reveals no gallop and no friction rub.  No murmur heard. Pulmonary/Chest: Effort normal. No respiratory distress. She has no wheezes. She has rales. She exhibits no  tenderness.  bascal crackles  Abdominal: Soft. Bowel sounds are normal. She exhibits no distension and no mass. There is no tenderness. There is no rebound and no guarding.  Musculoskeletal: Normal range of motion. She exhibits no edema or tenderness.  Lymphadenopathy:    She has no cervical adenopathy.  Neurological: She is alert and oriented to person, place, and time. She has normal reflexes. No cranial nerve deficit. She exhibits normal muscle tone. Coordination normal.  Skin: Skin is warm and dry. No rash noted. She is not diaphoretic. No erythema. No pallor.  Psychiatric: She has a normal mood and affect. Her behavior is normal. Judgment and thought content normal.  Vitals reviewed.  Vitals:   10/15/17 1151  BP: 132/68  Pulse: 73  SpO2: 94%  Weight: 230 lb (104.3 kg)  Height: _0  (1.6 m)    Estimated body mass index is 40.74 kg/m as calculated from the following:   Height as of this encounter: _1  (1.6 m).   Weight as of this encounter: 230 lb (104.3 kg).     Assessment:       ICD-10-CM   1. Solitary pulmonary nodule R91.1   2. Pulmonary emphysema, unspecified emphysema type ([place]) J43.9   3. Chronic congestive heart failure with right ventricular diastolic dysfunction (HCC) I50.82    I50.32   4. Dyspnea on exertion R06.09        Plan:     Solitary pulmonary nodule  - resolved On CT dec 2018  Pulmonary emphysema, unspecified emphysema type (HCC) v ASthma - cotninue breo - will send message to dr Martinique to take you  Off ramipril to see if that can help wheezing - refer  Pulmonary rehab  Chronic congestive heart failure with right ventricular diastolic dysfunction ([place]) - I Think this is why you have decompensation past few weeks  - management per cardiology  Shortness of breath   - is due to lung, heart and weight issues  - refer pulmonary rehab  Crackles  - monitor if will resolve with diuresis  Followup 3 months or sooner if needed  > 50% of this  > 25 min visit spent in face to face counseling or coordination of care    Dr. Brand Males, M.D.,  F.C.C.P Pulmonary and Critical Care Medicine Staff Physician, [place] Director - Interstitial Lung Disease  Program  Pulmonary [place] at [place], [place], [address]  Pager: [phone], If no answer or between  15:00h - 7:00h: call [phone] Telephone: [phone]

## 2017-10-16 ENCOUNTER — Telehealth: Payer: Self-pay | Admitting: Cardiology

## 2017-10-16 NOTE — Telephone Encounter (Signed)
Told pt that the ramipril that she needed to stop was her lisinopril 51m tablet. Pt expressed understanding. Told pt I was going to take this med off her med list and for her to monitor her bp's. Pt does have a f/u with Dr. JMartinique1/9. Told pt to make a list where she is monitoring her bp's and show it to Dr. JMartiniquewhen she goes.  Told her they needed to add a med for her, they could do that but we are stopping the lisinopril.

## 2017-10-16 NOTE — Telephone Encounter (Signed)
New Message   Pt c/o medication issue:  1. Name of Medication: Lisinopril  2. How are you currently taking this medication (dosage and times per day)? 70m   3. Are you having a reaction (difficulty breathing--STAT)? no  4. What is your medication issue? Patient states that she was advised to stop her lisinopril. But her concern is that since her BP tends to run high is that a good idea.

## 2017-10-16 NOTE — Telephone Encounter (Signed)
She was only on 5 mg daily and her last couple of BP readings looked OK. If BP goes up we can switch to losartan 25 mg daily

## 2017-10-16 NOTE — Telephone Encounter (Signed)
Returned call to patient of Dr. Martinique who saw pulmonologist yesterday. Lisinopril 47m was stopped to see if this would help with wheezing. She was concerned about this causing her to have elevated BP. Advised to stay off med as recommended to see if symptoms improve and to monitor home BP twice daily and call or send MyChart message if BP is consistently >>148systolic. She voiced understanding.   Routed to Dr. JMartiniqueas FJuluis Rainier

## 2017-10-19 ENCOUNTER — Other Ambulatory Visit: Payer: Medicare Other | Admitting: *Deleted

## 2017-10-19 DIAGNOSIS — I5032 Chronic diastolic (congestive) heart failure: Secondary | ICD-10-CM | POA: Diagnosis not present

## 2017-10-19 LAB — BASIC METABOLIC PANEL
BUN/Creatinine Ratio: 21 (ref 12–28)
BUN: 36 mg/dL — ABNORMAL HIGH (ref 8–27)
CO2: 25 mmol/L (ref 20–29)
Calcium: 9.3 mg/dL (ref 8.7–10.3)
Chloride: 97 mmol/L (ref 96–106)
Creatinine, Ser: 1.74 mg/dL — ABNORMAL HIGH (ref 0.57–1.00)
GFR calc Af Amer: 32 mL/min/{1.73_m2} — ABNORMAL LOW (ref >59)
GFR calc non Af Amer: 27 mL/min/{1.73_m2} — ABNORMAL LOW (ref >59)
Glucose: 155 mg/dL — ABNORMAL HIGH (ref 65–99)
Potassium: 4.4 mmol/L (ref 3.5–5.2)
Sodium: 139 mmol/L (ref 134–144)

## 2017-10-22 ENCOUNTER — Telehealth: Payer: Self-pay | Admitting: Cardiology

## 2017-10-22 NOTE — Telephone Encounter (Signed)
Patient states she does not weigh herself daily.  Confirmed with patient she has not eaten today. Instructed her to weigh herself to get a baseline weight before she eats.  She will weigh herself daily in the morning under the same circumstances (no food, same scale, etc.) and take an extra lasix if she gains 3 pounds or more or starts feeling SOB. She will keep appointment with Isaac Laud.  She was grateful for call and agrees with treatment plan.

## 2017-10-22 NOTE — Telephone Encounter (Signed)
New Message  Pt returning RN call about labs. Please call back to discuss

## 2017-10-22 NOTE — Telephone Encounter (Signed)
-----  Message from Eileen Stanford, [place] sent at 10/21/2017  7:35 PM EST ----- Lets make her weight now her dry weight and she can take an extra lasix if she goes up 3 lbs or more or if she starts feeling more SOB. Keep follow up with [facility]. Thanks !

## 2017-10-29 ENCOUNTER — Other Ambulatory Visit: Payer: Self-pay | Admitting: Nurse Practitioner

## 2017-10-29 ENCOUNTER — Other Ambulatory Visit: Payer: Self-pay

## 2017-10-29 DIAGNOSIS — R195 Other fecal abnormalities: Secondary | ICD-10-CM

## 2017-11-03 ENCOUNTER — Other Ambulatory Visit: Payer: Self-pay | Admitting: Pharmacist

## 2017-11-03 MED ORDER — APIXABAN 5 MG PO TABS: 5.0000 mg | ORAL_TABLET | Freq: Two times a day (BID) | ORAL | 1 refills | Status: DC

## 2017-11-04 ENCOUNTER — Ambulatory Visit (INDEPENDENT_AMBULATORY_CARE_PROVIDER_SITE_OTHER): Payer: Medicare Other | Admitting: Physician Assistant

## 2017-11-04 ENCOUNTER — Other Ambulatory Visit: Payer: Self-pay | Admitting: Pharmacist Clinician (PhC)/ Clinical Pharmacy Specialist

## 2017-11-04 ENCOUNTER — Encounter: Payer: Self-pay | Admitting: Physician Assistant

## 2017-11-04 VITALS — BP 134/60 | HR 70 | Ht 63.0 in | Wt 231.0 lb

## 2017-11-04 DIAGNOSIS — R059 Cough, unspecified: Secondary | ICD-10-CM

## 2017-11-04 DIAGNOSIS — Z952 Presence of prosthetic heart valve: Secondary | ICD-10-CM

## 2017-11-04 DIAGNOSIS — R05 Cough: Secondary | ICD-10-CM | POA: Diagnosis not present

## 2017-11-04 DIAGNOSIS — E785 Hyperlipidemia, unspecified: Secondary | ICD-10-CM

## 2017-11-04 DIAGNOSIS — Z8719 Personal history of other diseases of the digestive system: Secondary | ICD-10-CM | POA: Diagnosis not present

## 2017-11-04 DIAGNOSIS — I10 Essential (primary) hypertension: Secondary | ICD-10-CM | POA: Diagnosis not present

## 2017-11-04 DIAGNOSIS — R600 Localized edema: Secondary | ICD-10-CM | POA: Diagnosis not present

## 2017-11-04 DIAGNOSIS — I480 Paroxysmal atrial fibrillation: Secondary | ICD-10-CM

## 2017-11-04 MED ORDER — APIXABAN 5 MG PO TABS: 5.0000 mg | ORAL_TABLET | Freq: Two times a day (BID) | ORAL | 1 refills | Status: DC

## 2017-11-04 NOTE — Patient Instructions (Signed)
Medication Instructions:  Continue current medications  If you need a refill on your cardiac medications before your next appointment, please call your pharmacy.  Labwork: None Ordered   Testing/Procedures: None Ordered  Special Instructions: Please get a BMP done at your next visit with primary Care Physician  Follow-Up: Your physician wants you to follow-up in: 2-3 Months with dr Martinique.   Thank you for choosing [facility] at [facility]!!

## 2017-11-04 NOTE — Progress Notes (Signed)
Cardiology Office Note    Date:  11/06/2017   ID:  Patricia [name], DOB 1936/02/28, MRN [id number]  PCP:  [name], NP  Cardiologist:  Dr. Martinique / Dr. Burt Knack & Dr. Roxy Manns (TAVR)  Chief Complaint  Patient presents with  . Follow-up    seen for Dr. Martinique    History of Present Illness:  Patricia [name] is a 82 y.o. female with PMH of PAF on eliquis, COPD, HTN, HLD, anemia, GI bleeds 2/2 AVMs and severe AS s/p TAVR (09/2016).  She was admitted in the hospital in December 2016 with increased dyspnea and found to have atrial fibrillation with RVR.  She also had pulmonary edema as well.  Hemoglobin was 10.  Echocardiogram showed a normal EF with moderate to severe AS.  He was anticoagulated with Xarelto and started on amiodarone.  She eventually underwent cardiac catheterization showing severe AS with single-vessel obstructive CAD with 75% OM 3 lesion.  She was evaluated by our valve clinic and underwent transfemoral TAVR on 10/14/2916. Postop course was uncomplicated.  Repeat echocardiogram in January 2018 showed higher than expected AV prosthesis gradient probably to be related to vigorous LV systolic function and the valve/patient mismatch.  She also has some issues with recurrent anemia.  In March 2018, her hemoglobin went down to 7.4, she was transfused.  EGD showed multiple gastric polyps and some oozing AVMs that were cauterized.  She was most recently seen by our valve clinic on 10/14/2017.  She was having worsening dyspnea for the past several month felt to be multifactorial secondary to COPD, obesity and deconditioning.  Repeat echocardiogram showed a stable TAVR valve with elevated gradient mildly improved from the previous study.  Tracing on the echocardiogram did show sinus rhythm.  Her proBNP at the time was 2102.  Hemoglobin was 9.7, stable compared to October.  She had a prior history of solitary pulmonary nodule, this has resolved on CT of chest in December 2018.  There was also  some concern that lisinopril was causing her wheezing, but Dr. Chase Caller recommended discontinue ramipril to see if it helps.  Patient presents today for cardiology office visit.  She continued to cough, on physical exam, she has fine crackles in the right base.  She has also have diffusely decreased breath sounds throughout the entire lung consistent with her history of COPD.  After stopping the 5 mg lisinopril, she has not noticed much difference regarding her breathing.  She denies any significant lower extremity edema, orthopnea or PND.  On physical exam, she only has trace amount of edema in the lower extremity.  Given the recent rise of creatinine, I do not think we need to necessarily increase her diuretic again.  I am more in favor of continuing on the 40 mg daily of Lasix, with additional Lasix on a as needed basis.  She weighs herself every morning.  After discontinuing the lisinopril, her blood pressure is actually quite stable.  The fine crackles I heard in the right lower lung make me concerned of possible pulmonary fibrosis.  If her breathing becomes worse next few months, I would have very low threshold for a high-resolution CT to rule out pulmonary fibrosis especially since patient is on amiodarone.   Past Medical History:  Diagnosis Date  . Allergy   . Anemia, unspecified   . Arthritis   . Asthma   . Atrial fibrillation status post cardioversion [facility]) 09/2015   s/p TEE/DCCV>>SR on amio  . Benign neoplasm  of colon   . Carpal tunnel syndrome   . Cellulitis and abscess of finger, unspecified   . Cerumen impaction   . Cervicalgia   . CHF (congestive heart failure) ([place])   . Chronic airway obstruction, not elsewhere classified   . Chronic anticoagulation 09/2015   Xarelto for afib, CHADS2VASC=5  . Diarrhea   . Diffuse cystic mastopathy   . Dysrhythmia   . Edema   . Encounter for long-term (current) use of other medications   . GERD (gastroesophageal reflux disease)   . Heart  murmur   . Lumbago   . Neck pain 05/23/2015  . Obesity, unspecified   . Other and unspecified hyperlipidemia   . Other psoriasis   . Pain in joint, lower leg   . PONV (postoperative nausea and vomiting)   . Primary localized osteoarthrosis of right shoulder 12/18/2015  . Routine gynecological examination   . 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 Scoliosis (and kyphoscoliosis), idiopathic   . Severe aortic stenosis   . Shortness of breath dyspnea    with exertion  . Type II or unspecified type diabetes mellitus without mention of complication, uncontrolled   . Unspecified essential hypertension   . Unspecified hemorrhoids without mention of complication   . Unspecified hypothyroidism   . Urge incontinence     Past Surgical History:  Procedure Laterality Date  . ABDOMINAL HYSTERECTOMY  1987   complete  . BREAST BIOPSY Right   . BREAST EXCISIONAL BIOPSY Left 2011  . BREAST EXCISIONAL BIOPSY Right    x2  . BREAST SURGERY     right breast 3 surgeries [phone]  . CARDIAC CATHETERIZATION N/A 09/02/2016   Procedure: Right/Left Heart Cath and Coronary Angiography;  Surgeon: [name] M Martinique, MD;  Location: [place] [facility];  Service: Cardiovascular;  Laterality: N/A;  . CARDIOVERSION N/A 10/19/2015   Procedure: CARDIOVERSION;  Surgeon: Lelon Perla, MD;  Location: [place];  Service: Cardiovascular;  Laterality: N/A;  . [place]  . COLON SURGERY     2004,2007,2013.  3 times colon surgieres  . ESOPHAGOGASTRODUODENOSCOPY (EGD) WITH PROPOFOL Left 03/13/2017   Procedure: ESOPHAGOGASTRODUODENOSCOPY (EGD) WITH PROPOFOL;  Surgeon: Ronnette Juniper, MD;  Location: [place];  Service: Gastroenterology;  Laterality: Left;  . EXCISE LE MANDIBULAR LYMPH NODE T     DR C. NEWMAN  . FOOT SURGERY  1989  . LEFT SHOULDER ARTHROSCOPY    .  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 NEUROPLASTY / TRANSPOSITION MEDIAN NERVE AT CARPAL TUNNEL BILATERAL  05/2003  . TEE WITHOUT CARDIOVERSION N/A 10/19/2015   Procedure: Transesophageal Echocardiogram (TEE) ;  Surgeon: Lelon Perla, MD;  Location: [facility] ENDOSCOPY;  Service: Cardiovascular;  Laterality: N/A;  . TEE WITHOUT CARDIOVERSION N/A 10/14/2016   Procedure: TRANSESOPHAGEAL ECHOCARDIOGRAM (TEE);  Surgeon: Sherren Mocha, MD;  Location: [place];  Service: Open Heart Surgery;  Laterality: N/A;  . TOTAL SHOULDER ARTHROPLASTY Right 12/18/2015   Procedure: TOTAL SHOULDER ARTHROPLASTY;  Surgeon: [name], MD;  Location: [place];  Service: Orthopedics;  Laterality: Right;  . TRANSCATHETER AORTIC VALVE REPLACEMENT, TRANSFEMORAL N/A 10/14/2016   Procedure: TRANSCATHETER AORTIC VALVE REPLACEMENT, TRANSFEMORAL;  Surgeon: Sherren Mocha, MD;  Location: [place];  Service: Open Heart Surgery;  Laterality: N/A;  . TRIGGER FINGER RELEASE Left   . VAGINAL CYST REMOVED  1967    Current Medications: Outpatient Medications  Prior to Visit  Medication Sig Dispense Refill  . acetaminophen (TYLENOL) 500 MG tablet Take 500 mg by mouth every 6 (six) hours as needed for mild pain.    Marland Kitchen albuterol (PROVENTIL) (2.5 MG/3ML) 0.083% nebulizer solution Take 3 mLs (2.5 mg total) by nebulization every 4 (four) hours as needed for wheezing or shortness of breath.    Marland Kitchen albuterol (VENTOLIN HFA) 108 (90 Base) MCG/ACT inhaler Inhale 2 puffs into the lungs every 6 (six) hours as needed for wheezing or shortness of breath. 1 Inhaler 11  . amiodarone (PACERONE) 200 MG tablet Take 0.5 tablets (100 mg total) by mouth daily. Take one tablet daily by mouth 90 tablet 3  . cetirizine (ZYRTEC) 10 MG tablet Take 10 mg by mouth daily as needed for allergies.     . cholecalciferol (VITAMIN D) 1000 units tablet Take 1,000 Units by mouth daily.    . COMFORT LANCETS MISC Use daily to get blood for glucometer 100 each 3  . doxazosin (CARDURA) 4 MG  tablet Take 1 tablet (4 mg total) by mouth daily. 30 tablet 0  . ferrous sulfate 325 (65 FE) MG EC tablet Take 325 mg by mouth 2 (two) times daily.    . fluticasone furoate-vilanterol (BREO ELLIPTA) 100-25 MCG/INH AEPB Inhale into the lungs once daily 1 each 4  . furosemide (LASIX) 40 MG tablet Take 1 tablet (40 mg total) by mouth daily. 90 tablet 1  . glimepiride (AMARYL) 2 MG tablet Take one tablet by mouth once daily at breakfast to control glucose 90 tablet 1  . glucose blood (BAYER CONTOUR TEST) test strip Use daily too check glucose Dx: E11.9 100 each 4  . Ketotifen Fumarate (ALLERGY EYE DROPS OP) Place 1 drop into both eyes 3 (three) times daily as needed (allergies).     Marland Kitchen levothyroxine (SYNTHROID, LEVOTHROID) 88 MCG tablet Take 1 tablet (88 mcg total) by mouth daily. 90 tablet 3  . mirabegron ER (MYRBETRIQ) 25 MG TB24 tablet One daily to help bladder control 90 tablet 5  . pantoprazole (PROTONIX) 40 MG tablet TAKE 1 TABLET BY MOUTH TWO  TIMES DAILY 180 tablet 1  . potassium gluconate 595 (99 K) MG TABS tablet Take 595 mg by mouth daily. TAKES ONLY WHEN USING FUROSEMIDE    . pravastatin (PRAVACHOL) 20 MG tablet TAKE 1 TABLET BY MOUTH  DAILY 90 tablet 1  . triamcinolone (KENALOG) 0.025 % cream Apply 1 application topically daily as needed (psoriasis). 30 g 0  . apixaban (ELIQUIS) 5 MG TABS tablet Take 1 tablet (5 mg total) by mouth 2 (two) times daily. 180 tablet 1   No facility-administered medications prior to visit.      Allergies:   Codeine and Vesicare [solifenacin]   Social History   Socioeconomic History  . Marital status: Divorced    Spouse name: None  . Number of children: 1  . Years of education: None  . Highest education level: None  Social Needs  . Financial resource strain: None  . Food insecurity - worry: None  . Food insecurity - inability: None  . Transportation needs - medical: None  . Transportation needs - non-medical: None  Occupational History  .  Occupation: Retired Market researcher for [employer]  Tobacco Use  . Smoking status: Former Smoker    Packs/day: 1.00    Years: 40.00    Pack years: 40.00    Last attempt to quit: 09/07/1989    Years since quitting: 28.1  . Smokeless  tobacco: Never Used  Substance and Sexual Activity  . Alcohol use: No    Alcohol/week: 0.0 oz  . Drug use: No  . Sexual activity: No  Other Topics Concern  . None  Social History Narrative   Her daughter & 3 grandchildren live in the area.       Family History:  The patient's family history includes Arthritis in her sister; Asthma in her sister; Diabetes in her father; Heart disease in her brother and father; Liver cancer in her sister; Stroke in her father.   ROS:   Please see the history of present illness.    ROS All other systems reviewed and are negative.   PHYSICAL EXAM:   VS:  BP 134/60   Pulse 70   Ht _0  (1.6 m)   Wt 231 lb (104.8 kg)   BMI 40.92 kg/m    GEN: Well nourished, well developed, in no acute distress  HEENT: normal  Neck: no JVD, carotid bruits, or masses Cardiac: RRR; no murmurs, rubs, or gallops.  Trace lower extremity edema. Respiratory: Diffusely decreased breath sounds with fine crackles in the right base of the lung. GI: soft, nontender, nondistended, + BS MS: no deformity or atrophy  Skin: warm and dry, no rash Neuro:  Alert and Oriented x 3, Strength and sensation are intact Psych: euthymic mood, full affect  Wt Readings from Last 3 Encounters:  11/04/17 231 lb (104.8 kg)  10/15/17 230 lb (104.3 kg)  10/14/17 212 lb 12 oz (96.5 kg)      Studies/Labs Reviewed:   EKG:  EKG is not ordered today.    Recent Labs: 06/11/2017: ALT 20 08/06/2017: TSH 7.92 10/14/2017: Hemoglobin 9.7; NT-Pro BNP 2,102; Platelets 220 10/19/2017: BUN 36; Creatinine, Ser 1.74; Potassium 4.4; Sodium 139   Lipid Panel    Component Value Date/Time   CHOL 158 06/11/2017 0832   CHOL 182 04/28/2016 0940   TRIG 97  06/11/2017 0832   HDL 63 06/11/2017 0832   HDL 61 04/28/2016 0940   CHOLHDL 2.5 06/11/2017 0832   VLDL 19 06/11/2017 0832   LDLCALC 76 06/11/2017 0832   LDLCALC 86 04/28/2016 0940    Additional studies/ records that were reviewed today include:   Echo 10/14/2017 LV EF: 60% -   65%  Study Conclusions  - Left ventricle: The cavity size was normal. Wall thickness was   increased in a pattern of moderate LVH. Systolic function was   normal. The estimated ejection fraction was in the range of 60%   to 65%. Wall motion was normal; there were no regional wall   motion abnormalities. Diastolic dysfunction with elevated LV   filling pressure. - Aortic valve: s/p TAVR. No obstruction or paravalvular leak. Mean   gradient (S): 24 mm Hg. Peak gradient (S): 47 mm Hg. Valve area   (VTI): 1.19 cm^2. Valve area (Vmax): 1.09 cm^2. Valve area   (Vmean): 1.07 cm^2. - Mitral valve: Calcified annulus. Mildly thickened leaflets . Mild   stenosis. There was moderate regurgitation. - Left atrium: Severely dilated. - Right atrium: Mildly dilated. - Tricuspid valve: There was mild regurgitation. - Pulmonary arteries: PA peak pressure: 51 mm Hg (S). - Inferior vena cava: The vessel was normal in size. The   respirophasic diameter changes were in the normal range (>= 50%),   consistent with normal central venous pressure.  Impressions:  - Compared to a prior study in 10/2016, the TAVR valve appears   stable.   ASSESSMENT:  1. Cough   2. PAF (paroxysmal atrial fibrillation) ([place])   3. Lower extremity edema   4. Essential hypertension   5. Hyperlipidemia, unspecified hyperlipidemia type   6. S/P TAVR (transcatheter aortic valve replacement)   7. H/O: GI bleed      PLAN:  In order of problems listed above:  1. Cough: I do not think her cough is related to pulmonary edema, there is no evidence of heart failure on physical exam.  She is euvolemic.  I recommended continue the current dose  of diuretic.  After she stopped her lisinopril, her cough has not improved.  2. S/p TAVR: Recent echocardiogram showed no significant paravalvular leak, mean gradient 24 mmHg.  3. PAF: On Eliquis, no obvious sign of recurrence.  Currently on amiodarone 100 mg daily.  Physical exam showed very fine crackles in the right lower base, less likely interstitial edema, other differential diagnosis include atelectasis versus pulmonary fibrosis.  If her breathing gets worse, low threshold for high-resolution CT.  4. Hypertension: Blood pressure 134/60.  After recently discontinued her lisinopril, her blood pressure is stable.  5. Hyperlipidemia: On Pravachol 20 mg daily.  Last transplant lipid panel.  Last lipid panel obtained on 06/11/2017 showed total cholesterol 158, triglyceride 97, HDL 63, LDL 76.  6. History of GI bleed: Previous EGD showed oozing AVMs that was cauterized.  Hemoglobin obtained on 10/14/2017 was 9.7, stable compared to October.    Medication Adjustments/Labs and Tests Ordered: Current medicines are reviewed at length with the patient today.  Concerns regarding medicines are outlined above.  Medication changes, Labs and Tests ordered today are listed in the Patient Instructions below. Patient Instructions  Medication Instructions:  Continue current medications  If you need a refill on your cardiac medications before your next appointment, please call your pharmacy.  Labwork: None Ordered   Testing/Procedures: None Ordered  Special Instructions: Please get a BMP done at your next visit with primary Care Physician  Follow-Up: Your physician wants you to follow-up in: 2-3 Months with dr Martinique.   Thank you for choosing [facility] at [employer], [place]  11/06/2017 6:40 AM    [address] [place], [place], [place]  [address] Phone: [phone]; Fax: [phone]

## 2017-11-06 ENCOUNTER — Encounter: Payer: Self-pay | Admitting: Physician Assistant

## 2017-11-11 ENCOUNTER — Telehealth: Payer: Self-pay | Admitting: Cardiology

## 2017-11-11 NOTE — Telephone Encounter (Signed)
New message    Patient calling with concerns of high pulse rate for 3 days. Please call   STAT if HR is under 50 or over 120 (normal HR is 60-100 beats per minute)  1) What is your heart rate? 123  2) Do you have a log of your heart rate readings (document readings)? n/a  3) Do you have any other symptoms?   SOB, no energy

## 2017-11-11 NOTE — Telephone Encounter (Signed)
Returned the call to the patient. She stated that for the last 2 days she has been experiencing high heart rates up to 134. She has been been having increased shortness of breath and had increased her lasix to 80 mg to see if that alleviated the symptom. She stated that she went down 3 pounds but is still having the shortness of breath. She denies chest pain and edema. Per Dr. Martinique, an appointment has been made for tomorrow with him at 3:20. The patient has verbalized her understanding.

## 2017-11-12 ENCOUNTER — Ambulatory Visit (INDEPENDENT_AMBULATORY_CARE_PROVIDER_SITE_OTHER): Payer: Medicare Other | Admitting: Cardiology

## 2017-11-12 ENCOUNTER — Observation Stay (HOSPITAL_COMMUNITY)
Admission: AD | Admit: 2017-11-12 | Discharge: 2017-11-13 | Disposition: A | Discharge status: Home / Self Care | Payer: Medicare Other | Source: Ambulatory Visit | Attending: Cardiology | Admitting: Cardiology

## 2017-11-12 ENCOUNTER — Encounter (HOSPITAL_COMMUNITY): Payer: Self-pay

## 2017-11-12 ENCOUNTER — Other Ambulatory Visit: Payer: Self-pay

## 2017-11-12 ENCOUNTER — Encounter: Payer: Self-pay | Admitting: Cardiology

## 2017-11-12 VITALS — BP 114/64 | HR 145 | Ht 63.0 in | Wt 229.0 lb

## 2017-11-12 DIAGNOSIS — E039 Hypothyroidism, unspecified: Secondary | ICD-10-CM | POA: Diagnosis not present

## 2017-11-12 DIAGNOSIS — I480 Paroxysmal atrial fibrillation: Secondary | ICD-10-CM | POA: Diagnosis not present

## 2017-11-12 DIAGNOSIS — Z85038 Personal history of other malignant neoplasm of large intestine: Secondary | ICD-10-CM | POA: Insufficient documentation

## 2017-11-12 DIAGNOSIS — D649 Anemia, unspecified: Secondary | ICD-10-CM | POA: Diagnosis not present

## 2017-11-12 DIAGNOSIS — R0600 Dyspnea, unspecified: Secondary | ICD-10-CM | POA: Diagnosis present

## 2017-11-12 DIAGNOSIS — I350 Nonrheumatic aortic (valve) stenosis: Secondary | ICD-10-CM | POA: Insufficient documentation

## 2017-11-12 DIAGNOSIS — J45909 Unspecified asthma, uncomplicated: Secondary | ICD-10-CM | POA: Insufficient documentation

## 2017-11-12 DIAGNOSIS — N183 Chronic kidney disease, stage 3 (moderate): Secondary | ICD-10-CM | POA: Diagnosis not present

## 2017-11-12 DIAGNOSIS — I5023 Acute on chronic systolic (congestive) heart failure: Secondary | ICD-10-CM | POA: Diagnosis not present

## 2017-11-12 DIAGNOSIS — I10 Essential (primary) hypertension: Secondary | ICD-10-CM

## 2017-11-12 DIAGNOSIS — Z7901 Long term (current) use of anticoagulants: Secondary | ICD-10-CM | POA: Diagnosis not present

## 2017-11-12 DIAGNOSIS — Z7902 Long term (current) use of antithrombotics/antiplatelets: Secondary | ICD-10-CM | POA: Insufficient documentation

## 2017-11-12 DIAGNOSIS — E785 Hyperlipidemia, unspecified: Secondary | ICD-10-CM | POA: Insufficient documentation

## 2017-11-12 DIAGNOSIS — Z952 Presence of prosthetic heart valve: Secondary | ICD-10-CM

## 2017-11-12 DIAGNOSIS — J449 Chronic obstructive pulmonary disease, unspecified: Secondary | ICD-10-CM | POA: Insufficient documentation

## 2017-11-12 DIAGNOSIS — Z7984 Long term (current) use of oral hypoglycemic drugs: Secondary | ICD-10-CM | POA: Insufficient documentation

## 2017-11-12 DIAGNOSIS — N189 Chronic kidney disease, unspecified: Secondary | ICD-10-CM

## 2017-11-12 DIAGNOSIS — E119 Type 2 diabetes mellitus without complications: Secondary | ICD-10-CM | POA: Insufficient documentation

## 2017-11-12 DIAGNOSIS — I130 Hypertensive heart and chronic kidney disease with heart failure and stage 1 through stage 4 chronic kidney disease, or unspecified chronic kidney disease: Secondary | ICD-10-CM | POA: Insufficient documentation

## 2017-11-12 DIAGNOSIS — I5033 Acute on chronic diastolic (congestive) heart failure: Secondary | ICD-10-CM | POA: Insufficient documentation

## 2017-11-12 LAB — HEPATIC FUNCTION PANEL
ALT: 21 U/L (ref 14–54)
AST: 22 U/L (ref 15–41)
Albumin: 3.7 g/dL (ref 3.5–5.0)
Alkaline Phosphatase: 58 U/L (ref 38–126)
Bilirubin, Direct: 0.1 mg/dL (ref 0.1–0.5)
Indirect Bilirubin: 0.7 mg/dL (ref 0.3–0.9)
Total Bilirubin: 0.8 mg/dL (ref 0.3–1.2)
Total Protein: 7.1 g/dL (ref 6.5–8.1)

## 2017-11-12 LAB — GLUCOSE, CAPILLARY: Glucose-Capillary: 181 mg/dL — ABNORMAL HIGH (ref 65–99)

## 2017-11-12 LAB — CBC WITH DIFFERENTIAL/PLATELET
Basophils Absolute: 0 10*3/uL (ref 0.0–0.1)
Basophils Relative: 0 %
Eosinophils Absolute: 0.1 10*3/uL (ref 0.0–0.7)
Eosinophils Relative: 1 %
HCT: 31 % — ABNORMAL LOW (ref 36.0–46.0)
Hemoglobin: 9.8 g/dL — ABNORMAL LOW (ref 12.0–15.0)
Lymphocytes Relative: 14 %
Lymphs Abs: 1.1 10*3/uL (ref 0.7–4.0)
MCH: 28.7 pg (ref 26.0–34.0)
MCHC: 31.6 g/dL (ref 30.0–36.0)
MCV: 90.6 fL (ref 78.0–100.0)
Monocytes Absolute: 0.4 10*3/uL (ref 0.1–1.0)
Monocytes Relative: 5 %
Neutro Abs: 6.6 10*3/uL (ref 1.7–7.7)
Neutrophils Relative %: 80 %
Platelets: 221 10*3/uL (ref 150–400)
RBC: 3.42 MIL/uL — ABNORMAL LOW (ref 3.87–5.11)
RDW: 16.7 % — ABNORMAL HIGH (ref 11.5–15.5)
WBC: 8.1 10*3/uL (ref 4.0–10.5)

## 2017-11-12 LAB — BASIC METABOLIC PANEL
Anion gap: 11 (ref 5–15)
BUN: 39 mg/dL — ABNORMAL HIGH (ref 6–20)
CO2: 25 mmol/L (ref 22–32)
Calcium: 9.2 mg/dL (ref 8.9–10.3)
Chloride: 103 mmol/L (ref 101–111)
Creatinine, Ser: 1.91 mg/dL — ABNORMAL HIGH (ref 0.44–1.00)
GFR calc Af Amer: 28 mL/min — ABNORMAL LOW (ref >60)
GFR calc non Af Amer: 24 mL/min — ABNORMAL LOW (ref >60)
Glucose, Bld: 202 mg/dL — ABNORMAL HIGH (ref 65–99)
Potassium: 4.8 mmol/L (ref 3.5–5.1)
Sodium: 139 mmol/L (ref 135–145)

## 2017-11-12 LAB — BRAIN NATRIURETIC PEPTIDE: B Natriuretic Peptide: 839.5 pg/mL — ABNORMAL HIGH (ref 0.0–100.0)

## 2017-11-12 LAB — PROTIME-INR
INR: 1.48
Prothrombin Time: 17.8 seconds — ABNORMAL HIGH (ref 11.4–15.2)

## 2017-11-12 LAB — TSH: TSH: 4.734 u[IU]/mL — ABNORMAL HIGH (ref 0.350–4.500)

## 2017-11-12 MED ORDER — AMIODARONE HCL 200 MG PO TABS
400.0000 mg | ORAL_TABLET | Freq: Every day | ORAL | Status: DC
  Administered 2017-11-13: 400 mg via ORAL
  Filled 2017-11-12: qty 2

## 2017-11-12 MED ORDER — PRAVASTATIN SODIUM 20 MG PO TABS: 20.0000 mg | ORAL_TABLET | Freq: Every day | ORAL | Status: DC

## 2017-11-12 MED ORDER — FERROUS SULFATE 325 (65 FE) MG PO TABS: 325.0000 mg | ORAL_TABLET | Freq: Two times a day (BID) | ORAL | Status: DC

## 2017-11-12 MED ORDER — INSULIN ASPART 100 UNIT/ML ~~LOC~~ SOLN: 0.0000 [IU] | Freq: Every day | SUBCUTANEOUS | Status: DC

## 2017-11-12 MED ORDER — APIXABAN 5 MG PO TABS
5.0000 mg | ORAL_TABLET | Freq: Two times a day (BID) | ORAL | Status: DC
  Administered 2017-11-12 – 2017-11-13 (×2): 5 mg via ORAL
  Filled 2017-11-12 (×2): qty 1

## 2017-11-12 MED ORDER — FUROSEMIDE 10 MG/ML IJ SOLN
40.0000 mg | Freq: Once | INTRAMUSCULAR | Status: AC
  Administered 2017-11-12: 40 mg via INTRAVENOUS
  Filled 2017-11-12: qty 4

## 2017-11-12 MED ORDER — VITAMIN D 1000 UNITS PO TABS
1000.0000 [IU] | ORAL_TABLET | Freq: Every day | ORAL | Status: DC
  Administered 2017-11-12: 1000 [IU] via ORAL
  Filled 2017-11-12: qty 1

## 2017-11-12 MED ORDER — SODIUM CHLORIDE 0.9% FLUSH: 3.0000 mL | Freq: Two times a day (BID) | INTRAVENOUS | Status: DC

## 2017-11-12 MED ORDER — AMIODARONE HCL 200 MG PO TABS
300.0000 mg | ORAL_TABLET | Freq: Every day | ORAL | Status: AC
  Administered 2017-11-12: 300 mg via ORAL
  Filled 2017-11-12: qty 1

## 2017-11-12 MED ORDER — DOXAZOSIN MESYLATE 4 MG PO TABS
4.0000 mg | ORAL_TABLET | Freq: Every day | ORAL | Status: DC
  Filled 2017-11-12: qty 1

## 2017-11-12 MED ORDER — ALBUTEROL SULFATE HFA 108 (90 BASE) MCG/ACT IN AERS: 2.0000 | INHALATION_SPRAY | Freq: Four times a day (QID) | RESPIRATORY_TRACT | Status: DC | PRN

## 2017-11-12 MED ORDER — SODIUM CHLORIDE 0.9% FLUSH: 3.0000 mL | INTRAVENOUS | Status: DC | PRN

## 2017-11-12 MED ORDER — AMIODARONE HCL 100 MG PO TABS: 100.0000 mg | ORAL_TABLET | Freq: Every day | ORAL | Status: DC

## 2017-11-12 MED ORDER — FUROSEMIDE 40 MG PO TABS: 40.0000 mg | ORAL_TABLET | Freq: Every day | ORAL | Status: DC

## 2017-11-12 MED ORDER — INSULIN ASPART 100 UNIT/ML ~~LOC~~ SOLN: 0.0000 [IU] | Freq: Three times a day (TID) | SUBCUTANEOUS | Status: DC

## 2017-11-12 MED ORDER — PANTOPRAZOLE SODIUM 40 MG PO TBEC
40.0000 mg | DELAYED_RELEASE_TABLET | Freq: Two times a day (BID) | ORAL | Status: DC
  Administered 2017-11-12 – 2017-11-13 (×2): 40 mg via ORAL
  Filled 2017-11-12 (×2): qty 1

## 2017-11-12 MED ORDER — POTASSIUM GLUCONATE 595 (99 K) MG PO TABS: 595.0000 mg | ORAL_TABLET | Freq: Every day | ORAL | Status: DC

## 2017-11-12 MED ORDER — ONDANSETRON HCL 4 MG/2ML IJ SOLN: 4.0000 mg | Freq: Four times a day (QID) | INTRAMUSCULAR | Status: DC | PRN

## 2017-11-12 MED ORDER — LEVOTHYROXINE SODIUM 88 MCG PO TABS
88.0000 ug | ORAL_TABLET | Freq: Every day | ORAL | Status: DC
  Administered 2017-11-13: 88 ug via ORAL
  Filled 2017-11-12: qty 1

## 2017-11-12 MED ORDER — METOPROLOL TARTRATE 5 MG/5ML IV SOLN
2.5000 mg | Freq: Four times a day (QID) | INTRAVENOUS | Status: AC
  Administered 2017-11-12 – 2017-11-13 (×4): 2.5 mg via INTRAVENOUS
  Filled 2017-11-12 (×5): qty 5

## 2017-11-12 MED ORDER — DILTIAZEM HCL-DEXTROSE 100-5 MG/100ML-% IV SOLN (PREMIX)
5.0000 mg/h | INTRAVENOUS | Status: DC
Start: 2017-11-12 — End: 2017-11-13
  Administered 2017-11-12: 5 mg/h via INTRAVENOUS
  Filled 2017-11-12 (×2): qty 100

## 2017-11-12 MED ORDER — LORATADINE 10 MG PO TABS: 10.0000 mg | ORAL_TABLET | Freq: Every day | ORAL | Status: DC

## 2017-11-12 MED ORDER — ALBUTEROL SULFATE (2.5 MG/3ML) 0.083% IN NEBU
2.5000 mg | INHALATION_SOLUTION | RESPIRATORY_TRACT | Status: DC | PRN
  Administered 2017-11-12 – 2017-11-13 (×2): 2.5 mg via RESPIRATORY_TRACT
  Filled 2017-11-12 (×2): qty 3

## 2017-11-12 MED ORDER — FLUTICASONE FUROATE-VILANTEROL 100-25 MCG/INH IN AEPB
1.0000 | INHALATION_SPRAY | Freq: Every day | RESPIRATORY_TRACT | Status: DC
  Administered 2017-11-13: 08:00:00 1 via RESPIRATORY_TRACT
  Filled 2017-11-12: qty 28

## 2017-11-12 MED ORDER — ACETAMINOPHEN 325 MG PO TABS
650.0000 mg | ORAL_TABLET | ORAL | Status: DC | PRN
  Administered 2017-11-13: 650 mg via ORAL
  Filled 2017-11-12: qty 2

## 2017-11-12 MED ORDER — MIRABEGRON ER 25 MG PO TB24: 25.0000 mg | ORAL_TABLET | Freq: Every day | ORAL | Status: DC

## 2017-11-12 MED ORDER — APIXABAN 5 MG PO TABS: 5.0000 mg | ORAL_TABLET | Freq: Two times a day (BID) | ORAL | Status: DC

## 2017-11-12 MED ORDER — SODIUM CHLORIDE 0.9 % IV SOLN: 250.0000 mL | INTRAVENOUS | Status: DC | PRN

## 2017-11-12 NOTE — H&P (Signed)
Date: 11/12/2017  ID: Patricia [name], DOB 1936-06-06, MRN [id number]  PCP: [name], NP  Cardiologist: [name] Martinique, MD      Chief Complaint  Patient presents with  . Congestive Heart Failure  . Atrial Fibrillation   History of Present Illness:  Patricia [name] is a 82 y.o. female who is seen as a work in for evaluation of increased edema, dyspnea, and tachycardia. She has a history of HTN, obesity, DM, HL, and anemia. She was admitted to the hospital in December 2016 with increased dyspnea. She was found to be in atrial fibrillation with RVR. She had pulmonary edema. Hgb was 10. Echo showed EF 55-60% with moderate to severe AS. She was anticoagulated with Xarelto and started on amiodarone. She had TEE guided DCCV with return to NSR. She was seen back in the office on 11/20/15 and was still in NSR.   On 12/18/15 she underwent right shoulder arthroplasty. Post op Hgb was 9.6. It subsequently dropped to 7.1. She was transfused. She has been on iron. Hgb improved to 10. She did have EGD and colonoscopy with both gastric and colonic polyps. No bleeding seen.   On follow up in October she noted symptoms of increased dyspnea. 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 Mild pulmonary HTN and elevated filling pressures. She was admitted in November with acute CHF and was diuresed. She was evaluated in valve clinic and underwent transfemoral TAVR on 10/14/16. Post op course was uncomplicated. Repeat Echo in January Showed higher than expected AV prosthesis gradient felt to be related to vigorous LV systolic function and Valve/ patient mismatch. She has been on Eliquis. She has noted moderate improvement in her breathing.   She continues to have issues with COPD and bronchitis- followed by primary care and pulmonary.   In March she was noted to have progressive anemia with dark stools. Hgb down to 7.4. She was transfused and EGD showed  multiple gastric polyps and some oozing AVMs that were cauterized. Once again she was admitted in May with worsening anemia. 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 Transfused 1 units PRBCs. Plan for outpatient capsule endoscopy.   She was seen January 9 with complaints of cough that did not improve with stopping ACEi. Some pulmonary crackles noted at the time. Pulse rate 70 then. CT chest in December showed emphysema and periapical scarring but no acute infiltrates.   She called yesterday with complaints of increased SOB and increased HR. Seen today. Notes more SOB since Monday. Took extra lasix for 2-3 days without improvement. Since yesterday noted HR was higher. She has been very compliant with medical therapy including Eliquis with no missed doses. She does note some discomfort between her shoulder blades when she takes a deep breath. She denies any swelling or increased weight.       Past Medical History:  Diagnosis Date  . Allergy   . Anemia, unspecified   . Arthritis   . Asthma   . Atrial fibrillation status post cardioversion [facility]) 09/2015   s/p TEE/DCCV>>SR on amio  . Benign neoplasm of colon   . Carpal tunnel syndrome   . Cellulitis and abscess of finger, unspecified   . Cerumen impaction   . Cervicalgia   . CHF (congestive heart failure) ([place])   . Chronic airway obstruction, not elsewhere classified   . Chronic anticoagulation 09/2015   Xarelto for afib, CHADS2VASC=5  . Diarrhea   .  Diffuse cystic mastopathy   . Dysrhythmia   . Edema   . Encounter for long-term (current) use of other medications   . GERD (gastroesophageal reflux disease)   . Heart murmur   . Lumbago   . Neck pain 05/23/2015  . Obesity, unspecified   . Other and unspecified hyperlipidemia   . Other psoriasis   . Pain in joint, lower leg   . PONV (postoperative nausea and vomiting)   . Primary localized osteoarthrosis of right shoulder 12/18/2015  . Routine gynecological examination   .  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 Scoliosis (and kyphoscoliosis), idiopathic   . Severe aortic stenosis   . Shortness of breath dyspnea    with exertion  . Type II or unspecified type diabetes mellitus without mention of complication, uncontrolled   . Unspecified essential hypertension   . Unspecified hemorrhoids without mention of complication   . Unspecified hypothyroidism   . Urge incontinence         Past Surgical History:  Procedure Laterality Date  . ABDOMINAL HYSTERECTOMY  1987   complete  . BREAST BIOPSY Right   . BREAST EXCISIONAL BIOPSY Left 2011  . BREAST EXCISIONAL BIOPSY Right    x2  . BREAST SURGERY     right breast 3 surgeries [phone]  . CARDIAC CATHETERIZATION N/A 09/02/2016   Procedure: Right/Left Heart Cath and Coronary Angiography; Surgeon: [name] M Martinique, MD; Location: [facility]; Service: Cardiovascular; Laterality: N/A;  . CARDIOVERSION N/A 10/19/2015   Procedure: CARDIOVERSION; Surgeon: Lelon Perla, MD; Location: [place]; Service: Cardiovascular; Laterality: N/A;  . [place]  . COLON SURGERY     2004,2007,2013. 3 times colon surgieres  . ESOPHAGOGASTRODUODENOSCOPY (EGD) WITH PROPOFOL Left 03/13/2017   Procedure: ESOPHAGOGASTRODUODENOSCOPY (EGD) WITH PROPOFOL; Surgeon: Ronnette Juniper, MD; Location: [place]; Service: Gastroenterology; Laterality: Left;  . EXCISE LE MANDIBULAR LYMPH NODE T     DR C. NEWMAN  . FOOT SURGERY  1989  . LEFT SHOULDER ARTHROSCOPY    . 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 NEUROPLASTY / TRANSPOSITION MEDIAN NERVE AT CARPAL TUNNEL BILATERAL  05/2003  . TEE WITHOUT CARDIOVERSION N/A 10/19/2015   Procedure: Transesophageal Echocardiogram (TEE) ; Surgeon: Lelon Perla, MD; Location: [facility] ENDOSCOPY; Service: Cardiovascular; Laterality: N/A;  . TEE WITHOUT  CARDIOVERSION N/A 10/14/2016   Procedure: TRANSESOPHAGEAL ECHOCARDIOGRAM (TEE); Surgeon: Sherren Mocha, MD; Location: [place]; Service: Open Heart Surgery; Laterality: N/A;  . TOTAL SHOULDER ARTHROPLASTY Right 12/18/2015   Procedure: TOTAL SHOULDER ARTHROPLASTY; Surgeon: [name], MD; Location: [place]; Service: Orthopedics; Laterality: Right;  . TRANSCATHETER AORTIC VALVE REPLACEMENT, TRANSFEMORAL N/A 10/14/2016   Procedure: TRANSCATHETER AORTIC VALVE REPLACEMENT, TRANSFEMORAL; Surgeon: Sherren Mocha, MD; Location: [place]; Service: Open Heart Surgery; Laterality: N/A;  . TRIGGER FINGER RELEASE Left   . VAGINAL CYST REMOVED  1967         Current Outpatient Medications  Medication Sig Dispense Refill  . acetaminophen (TYLENOL) 500 MG tablet Take 500 mg by mouth every 6 (six) hours as needed for mild pain.    Marland Kitchen albuterol (PROVENTIL) (2.5 MG/3ML) 0.083% nebulizer solution Take 3 mLs (2.5 mg total) by nebulization every 4 (four) hours as needed for wheezing or shortness of breath.    Marland Kitchen albuterol (VENTOLIN HFA) 108 (90 Base) MCG/ACT inhaler Inhale 2 puffs into the lungs every 6 (six) hours as needed for  wheezing or shortness of breath. 1 Inhaler 11  . amiodarone (PACERONE) 200 MG tablet Take 0.5 tablets (100 mg total) by mouth daily. Take one tablet daily by mouth 90 tablet 3  . apixaban (ELIQUIS) 5 MG TABS tablet Take 1 tablet (5 mg total) by mouth 2 (two) times daily. 180 tablet 1  . cetirizine (ZYRTEC) 10 MG tablet Take 10 mg by mouth daily as needed for allergies.     . cholecalciferol (VITAMIN D) 1000 units tablet Take 1,000 Units by mouth daily.    . COMFORT LANCETS MISC Use daily to get blood for glucometer 100 each 3  . doxazosin (CARDURA) 4 MG tablet Take 1 tablet (4 mg total) by mouth daily. 30 tablet 0  . ferrous sulfate 325 (65 FE) MG EC tablet Take 325 mg by mouth 2 (two) times daily.    . fluticasone furoate-vilanterol (BREO ELLIPTA) 100-25 MCG/INH AEPB Inhale into the lungs once  daily 1 each 4  . furosemide (LASIX) 40 MG tablet Take 1 tablet (40 mg total) by mouth daily. 90 tablet 1  . glimepiride (AMARYL) 2 MG tablet Take one tablet by mouth once daily at breakfast to control glucose 90 tablet 1  . glucose blood (BAYER CONTOUR TEST) test strip Use daily too check glucose Dx: E11.9 100 each 4  . Ketotifen Fumarate (ALLERGY EYE DROPS OP) Place 1 drop into both eyes 3 (three) times daily as needed (allergies).     Marland Kitchen levothyroxine (SYNTHROID, LEVOTHROID) 88 MCG tablet Take 1 tablet (88 mcg total) by mouth daily. 90 tablet 3  . mirabegron ER (MYRBETRIQ) 25 MG TB24 tablet One daily to help bladder control 90 tablet 5  . pantoprazole (PROTONIX) 40 MG tablet TAKE 1 TABLET BY MOUTH TWO TIMES DAILY 180 tablet 1  . potassium gluconate 595 (99 K) MG TABS tablet Take 595 mg by mouth daily. TAKES ONLY WHEN USING FUROSEMIDE    . pravastatin (PRAVACHOL) 20 MG tablet TAKE 1 TABLET BY MOUTH DAILY 90 tablet 1  . triamcinolone (KENALOG) 0.025 % cream Apply 1 application topically daily as needed (psoriasis). 30 g 0   No current facility-administered medications for this visit.    Allergies: Codeine and Vesicare [solifenacin]  Social History: The patient reports that she quit smoking about 28 years ago. She has a 40.00 pack-year smoking history. she has never used smokeless tobacco. She reports that she does not drink alcohol or use drugs.  Family History: The patient's family history includes Arthritis in her sister; Asthma in her sister; Diabetes in her father; Heart disease in her brother and father; Liver cancer in her sister; Stroke in her father.  ROS: Please see the history of present illness. Otherwise, review of systems are positive for none. All other systems are reviewed and negative.   PHYSICAL EXAM:  VS: BP 114/64 (BP Location: Left Arm, Patient Position: Sitting, Cuff Size: Large)  Pulse (!) 145  Ht _0  (1.6 m)  Wt 229 lb (103.9 kg)  BMI 40.57 kg/m , BMI Body mass index  is 40.57 kg/m.  GEN: Well nourished, Morbidly obese.  HEENT: normal  Neck: no JVD, carotid bruits, or masses  Cardiac: IRRR- tachy; harsh gr 2/6 systolic murmur RUSB. No gallop  Respiratory: clear to auscultation bilaterally, scant wheeze  GI: soft, nontender, nondistended, + BS  MS: no deformity or atrophy  Skin: warm and dry, no rash. no LE edema.  Neuro: Strength and sensation are intact  Psych: euthymic mood, full affect  EKG: EKG is ordered today. Atrial fibrillation with rate 145. LBBB. I have personally reviewed and interpreted this study.    Recent Labs: RecentLabs       Lab Results  Component Value Date   WBC 6.0 10/14/2017   HGB 9.7 (L) 10/14/2017   HCT 31.2 (L) 10/14/2017   PLT 220 10/14/2017   GLUCOSE 155 (H) 10/19/2017   CHOL 158 06/11/2017   TRIG 97 06/11/2017   HDL 63 06/11/2017   LDLCALC 76 06/11/2017   ALT 20 06/11/2017   AST 17 06/11/2017   NA 139 10/19/2017   K 4.4 10/19/2017   CL 97 10/19/2017   CREATININE 1.74 (H) 10/19/2017   BUN 36 (H) 10/19/2017   CO2 25 10/19/2017   TSH 7.92 (H) 08/06/2017   INR 1.29 03/11/2017   HGBA1C 5.8 (H) 04/06/2017   MICROALBUR 6.3 06/11/2017      Lipid Panel Labs(Brief)          Component Value Date/Time   CHOL 158 06/11/2017 0832   CHOL 182 04/28/2016 0940   TRIG 97 06/11/2017 0832   HDL 63 06/11/2017 0832   HDL 61 04/28/2016 0940   CHOLHDL 2.5 06/11/2017 0832   VLDL 19 06/11/2017 0832   LDLCALC 76 06/11/2017 0832   LDLCALC 86 04/28/2016 0940          Wt Readings from Last 3 Encounters:  11/12/17 229 lb (103.9 kg)  11/04/17 231 lb (104.8 kg)  10/15/17 230 lb (104.3 kg)   Additional studies/ records that were reviewed today include:  Echo 10/14/17: Study Conclusions  - Left ventricle: The cavity size was normal. Wall thickness was  increased in a pattern of moderate LVH. Systolic function was  normal. The estimated ejection fraction was in the range of 60%    to 65%. Wall motion was normal; there were no regional wall  motion abnormalities. Diastolic dysfunction with elevated LV  filling pressure.  - Aortic valve: s/p TAVR. No obstruction or paravalvular leak. Mean  gradient (S): 24 mm Hg. Peak gradient (S): 47 mm Hg. Valve area  (VTI): 1.19 cm^2. Valve area (Vmax): 1.09 cm^2. Valve area  (Vmean): 1.07 cm^2.  - Mitral valve: Calcified annulus. Mildly thickened leaflets . Mild  stenosis. There was moderate regurgitation.  - Left atrium: Severely dilated.  - Right atrium: Mildly dilated.  - Tricuspid valve: There was mild regurgitation.  - Pulmonary arteries: PA peak pressure: 51 mm Hg (S).  - Inferior vena cava: The vessel was normal in size. The  respirophasic diameter changes were in the normal range (>= 50%),  consistent with normal central venous pressure.  Impressions:  - Compared to a prior study in 10/2016, the TAVR valve appears  stable.   ASSESSMENT AND PLAN:  1. Atrial fibrillation with RVR. S/p DCCV in December 2016. This is her first recurrence since 2016. Symptomatic with poor rate control. Recommend hospitalization for rate control. Start IV Cardizem. Will increase amiodarone from 100 mg to 400 mg daily. Continue on Eliquis. Obtain routine labs including CBC, CMET, TSH. Check BNP. Keep NPO after midnight for DCCV tomorrow.   2. Severe Aortic stenosis. Echo in July showed progression with velocity > 4 cm/sec and mean gradient of 40 mm Hg. Now s/p TAVR in December 2017. Follow up Echo 12/18 with higher than expected gradient (mean gradient of 29 mm Hg) due to patient/ prosthetic mismatch with small annuls size and vigorous LV function.   3. Anemia Fe deficient with GI blood loss secondary  to AVMS. On Eliquis. With AS not surprising that she was found to have AVMs. Also gastric polyps. Check Hgb. No obvious bleeding at this time.   4. Chronic kidney disease stage 3.   5. S/p right shoulder arthroplasty.   6. Acute on Chronic  diastolic CHF. Increased due to development of Afib with RVR. Would give IV lasix on admit and monitor.   7. HTN- BP is controlled.   8. DM type 2.   9. COPD.   Signed,  [name] Martinique, MD  11/12/2017 5:23 PM  [facility]  [address] [place], [place], [address]  Phone [phone], Fax [phone]

## 2017-11-12 NOTE — Progress Notes (Signed)
Cardiology Office Note   Date:  11/12/2017   ID:  Patricia [name], DOB 07/30/1936, MRN [id number]  PCP:  [name], NP  Cardiologist:   Sanjiv Castorena Martinique, MD   Chief Complaint  Patient presents with  . Congestive Heart Failure  . Atrial Fibrillation      History of Present Illness: Patricia [name] is a 82 y.o. female who is seen as a work in for evaluation of increased edema, dyspnea, and tachycardia.  She has a history of HTN, obesity, DM, HL, and anemia. She was admitted to the hospital in December 2016 with increased dyspnea. She was found to be in atrial fibrillation with RVR. She had pulmonary edema. Hgb was 10. Echo showed EF 55-60% with moderate to severe AS. She was anticoagulated with Xarelto and started on amiodarone. She had TEE guided DCCV with return to NSR. She was seen back in the office on 11/20/15 and was still in NSR.  On 12/18/15 she underwent right shoulder arthroplasty. Post op Hgb was 9.6. It subsequently dropped to 7.1. She was transfused. She has been on iron. Hgb improved to 10. She did have EGD and colonoscopy with both gastric and colonic polyps. No bleeding seen.   On follow up in October she noted symptoms of increased dyspnea.  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 Mild pulmonary HTN and elevated filling pressures. She was admitted in November with acute CHF and was diuresed. She was evaluated in valve clinic and underwent transfemoral TAVR on 10/14/16. Post op course was uncomplicated. Repeat Echo in January  Showed higher than expected AV prosthesis gradient felt to be related to vigorous LV systolic function and Valve/ patient mismatch. She has been on  Eliquis. She has noted moderate improvement in her breathing.   She continues to have issues with COPD and bronchitis- followed by primary care and pulmonary.  In March she was noted to have progressive anemia with dark stools. Hgb down to 7.4.  She was transfused and EGD showed multiple gastric polyps and some oozing AVMs that were cauterized. Once again she was admitted in May with worsening anemia. 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 Transfused 1 units PRBCs. Plan for outpatient capsule endoscopy.   She was seen January 9 with complaints of cough that did not improve with stopping ACEi. Some pulmonary crackles noted at the time. Pulse rate 70 then. CT chest in December showed emphysema and periapical scarring but no acute infiltrates.   She called yesterday with complaints of increased SOB and increased HR. Seen today. Notes more SOB since Monday. Took extra lasix for 2-3 days without improvement. Since yesterday noted HR was higher. She has been very compliant with medical therapy including Eliquis with no missed doses. She does note some discomfort between her shoulder blades when she takes a deep breath. She denies any swelling or increased weight.     Past Medical History:  Diagnosis Date  . Allergy   . Anemia, unspecified   . Arthritis   . Asthma   . Atrial fibrillation status post cardioversion [facility]) 09/2015   s/p TEE/DCCV>>SR on amio  . Benign neoplasm of colon   . Carpal tunnel syndrome   . Cellulitis and abscess of finger, unspecified   . Cerumen impaction   . Cervicalgia   . CHF (congestive heart failure) ([place])   . Chronic airway obstruction, not elsewhere classified   . Chronic  anticoagulation 09/2015   Xarelto for afib, CHADS2VASC=5  . Diarrhea   . Diffuse cystic mastopathy   . Dysrhythmia   . Edema   . Encounter for long-term (current) use of other medications   . GERD (gastroesophageal reflux disease)   . Heart murmur   . Lumbago   . Neck pain 05/23/2015  . Obesity, unspecified   . Other and unspecified hyperlipidemia   . Other psoriasis   . Pain in joint, lower leg   . PONV (postoperative nausea and vomiting)   . Primary localized osteoarthrosis of right shoulder 12/18/2015  .  Routine gynecological examination   . 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 Scoliosis (and kyphoscoliosis), idiopathic   . Severe aortic stenosis   . Shortness of breath dyspnea    with exertion  . Type II or unspecified type diabetes mellitus without mention of complication, uncontrolled   . Unspecified essential hypertension   . Unspecified hemorrhoids without mention of complication   . Unspecified hypothyroidism   . Urge incontinence     Past Surgical History:  Procedure Laterality Date  . ABDOMINAL HYSTERECTOMY  1987   complete  . BREAST BIOPSY Right   . BREAST EXCISIONAL BIOPSY Left 2011  . BREAST EXCISIONAL BIOPSY Right    x2  . BREAST SURGERY     right breast 3 surgeries [phone]  . CARDIAC CATHETERIZATION N/A 09/02/2016   Procedure: Right/Left Heart Cath and Coronary Angiography;  Surgeon: Yentl Verge M Martinique, MD;  Location: [facility];  Service: Cardiovascular;  Laterality: N/A;  . CARDIOVERSION N/A 10/19/2015   Procedure: CARDIOVERSION;  Surgeon: Lelon Perla, MD;  Location: [place];  Service: Cardiovascular;  Laterality: N/A;  . [place]  . COLON SURGERY     2004,2007,2013.  3 times colon surgieres  . ESOPHAGOGASTRODUODENOSCOPY (EGD) WITH PROPOFOL Left 03/13/2017   Procedure: ESOPHAGOGASTRODUODENOSCOPY (EGD) WITH PROPOFOL;  Surgeon: Ronnette Juniper, MD;  Location: [place];  Service: Gastroenterology;  Laterality: Left;  . EXCISE LE MANDIBULAR LYMPH NODE T     DR C. NEWMAN  . FOOT SURGERY  1989  . LEFT SHOULDER ARTHROSCOPY    . 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 NEUROPLASTY / TRANSPOSITION MEDIAN NERVE AT CARPAL TUNNEL BILATERAL  05/2003  . TEE WITHOUT CARDIOVERSION N/A 10/19/2015   Procedure: Transesophageal Echocardiogram (TEE) ;  Surgeon: Lelon Perla, MD;  Location: [facility]OS[facility];  Service:  Cardiovascular;  Laterality: N/A;  . TEE WITHOUT CARDIOVERSION N/A 10/14/2016   Procedure: TRANSESOPHAGEAL ECHOCARDIOGRAM (TEE);  Surgeon: Sherren Mocha, MD;  Location: [place];  Service: Open Heart Surgery;  Laterality: N/A;  . TOTAL SHOULDER ARTHROPLASTY Right 12/18/2015   Procedure: TOTAL SHOULDER ARTHROPLASTY;  Surgeon: [name], MD;  Location: [place];  Service: Orthopedics;  Laterality: Right;  . TRANSCATHETER AORTIC VALVE REPLACEMENT, TRANSFEMORAL N/A 10/14/2016   Procedure: TRANSCATHETER AORTIC VALVE REPLACEMENT, TRANSFEMORAL;  Surgeon: Sherren Mocha, MD;  Location: [place];  Service: Open Heart Surgery;  Laterality: N/A;  . TRIGGER FINGER RELEASE Left   . VAGINAL CYST REMOVED  1967     Current Outpatient Medications  Medication Sig Dispense Refill  . acetaminophen (TYLENOL) 500 MG tablet Take 500 mg by mouth every 6 (six) hours as needed for mild pain.    Marland Kitchen albuterol (PROVENTIL) (2.5 MG/3ML) 0.083% nebulizer solution Take 3 mLs (2.5 mg total) by nebulization every 4 (  four) hours as needed for wheezing or shortness of breath.    Marland Kitchen albuterol (VENTOLIN HFA) 108 (90 Base) MCG/ACT inhaler Inhale 2 puffs into the lungs every 6 (six) hours as needed for wheezing or shortness of breath. 1 Inhaler 11  . amiodarone (PACERONE) 200 MG tablet Take 0.5 tablets (100 mg total) by mouth daily. Take one tablet daily by mouth 90 tablet 3  . apixaban (ELIQUIS) 5 MG TABS tablet Take 1 tablet (5 mg total) by mouth 2 (two) times daily. 180 tablet 1  . cetirizine (ZYRTEC) 10 MG tablet Take 10 mg by mouth daily as needed for allergies.     . cholecalciferol (VITAMIN D) 1000 units tablet Take 1,000 Units by mouth daily.    . COMFORT LANCETS MISC Use daily to get blood for glucometer 100 each 3  . doxazosin (CARDURA) 4 MG tablet Take 1 tablet (4 mg total) by mouth daily. 30 tablet 0  . ferrous sulfate 325 (65 FE) MG EC tablet Take 325 mg by mouth 2 (two) times daily.    . fluticasone furoate-vilanterol (BREO  ELLIPTA) 100-25 MCG/INH AEPB Inhale into the lungs once daily 1 each 4  . furosemide (LASIX) 40 MG tablet Take 1 tablet (40 mg total) by mouth daily. 90 tablet 1  . glimepiride (AMARYL) 2 MG tablet Take one tablet by mouth once daily at breakfast to control glucose 90 tablet 1  . glucose blood (BAYER CONTOUR TEST) test strip Use daily too check glucose Dx: E11.9 100 each 4  . Ketotifen Fumarate (ALLERGY EYE DROPS OP) Place 1 drop into both eyes 3 (three) times daily as needed (allergies).     Marland Kitchen levothyroxine (SYNTHROID, LEVOTHROID) 88 MCG tablet Take 1 tablet (88 mcg total) by mouth daily. 90 tablet 3  . mirabegron ER (MYRBETRIQ) 25 MG TB24 tablet One daily to help bladder control 90 tablet 5  . pantoprazole (PROTONIX) 40 MG tablet TAKE 1 TABLET BY MOUTH TWO  TIMES DAILY 180 tablet 1  . potassium gluconate 595 (99 K) MG TABS tablet Take 595 mg by mouth daily. TAKES ONLY WHEN USING FUROSEMIDE    . pravastatin (PRAVACHOL) 20 MG tablet TAKE 1 TABLET BY MOUTH  DAILY 90 tablet 1  . triamcinolone (KENALOG) 0.025 % cream Apply 1 application topically daily as needed (psoriasis). 30 g 0   No current facility-administered medications for this visit.     Allergies:   Codeine and Vesicare [solifenacin]    Social History:  The patient  reports that she quit smoking about 28 years ago. She has a 40.00 pack-year smoking history. she has never used smokeless tobacco. She reports that she does not drink alcohol or use drugs.   Family History:  The patient's family history includes Arthritis in her sister; Asthma in her sister; Diabetes in her father; Heart disease in her brother and father; Liver cancer in her sister; Stroke in her father.    ROS:  Please see the history of present illness.   Otherwise, review of systems are positive for none.   All other systems are reviewed and negative.    PHYSICAL EXAM: VS:  BP 114/64 (BP Location: Left Arm, Patient Position: Sitting, Cuff Size: Large)   Pulse (!)  145   Ht _0  (1.6 m)   Wt 229 lb (103.9 kg)   BMI 40.57 kg/m  , BMI Body mass index is 40.57 kg/m. GEN: Well nourished, Morbidly obese.  HEENT: normal  Neck: no JVD, carotid bruits, or masses  Cardiac: IRRR- tachy; harsh gr 2/6 systolic murmur RUSB.  No gallop  Respiratory:  clear to auscultation bilaterally, scant wheeze GI: soft, nontender, nondistended, + BS MS: no deformity or atrophy  Skin: warm and dry, no rash. no LE edema.  Neuro:  Strength and sensation are intact Psych: euthymic mood, full affect   EKG:  EKG is  ordered today. Atrial fibrillation with rate 145. LBBB. I have personally reviewed and interpreted this study.   Recent Labs: Lab Results  Component Value Date   WBC 6.0 10/14/2017   HGB 9.7 (L) 10/14/2017   HCT 31.2 (L) 10/14/2017   PLT 220 10/14/2017   GLUCOSE 155 (H) 10/19/2017   CHOL 158 06/11/2017   TRIG 97 06/11/2017   HDL 63 06/11/2017   LDLCALC 76 06/11/2017   ALT 20 06/11/2017   AST 17 06/11/2017   NA 139 10/19/2017   K 4.4 10/19/2017   CL 97 10/19/2017   CREATININE 1.74 (H) 10/19/2017   BUN 36 (H) 10/19/2017   CO2 25 10/19/2017   TSH 7.92 (H) 08/06/2017   INR 1.29 03/11/2017   HGBA1C 5.8 (H) 04/06/2017   MICROALBUR 6.3 06/11/2017      Lipid Panel    Component Value Date/Time   CHOL 158 06/11/2017 0832   CHOL 182 04/28/2016 0940   TRIG 97 06/11/2017 0832   HDL 63 06/11/2017 0832   HDL 61 04/28/2016 0940   CHOLHDL 2.5 06/11/2017 0832   VLDL 19 06/11/2017 0832   LDLCALC 76 06/11/2017 0832   LDLCALC 86 04/28/2016 0940      Wt Readings from Last 3 Encounters:  11/12/17 229 lb (103.9 kg)  11/04/17 231 lb (104.8 kg)  10/15/17 230 lb (104.3 kg)      Other studies Reviewed: Additional studies/ records that were reviewed today include:   Echo 10/14/17: Study Conclusions  - Left ventricle: The cavity size was normal. Wall thickness was   increased in a pattern of moderate LVH. Systolic function was   normal. The  estimated ejection fraction was in the range of 60%   to 65%. Wall motion was normal; there were no regional wall   motion abnormalities. Diastolic dysfunction with elevated LV   filling pressure. - Aortic valve: s/p TAVR. No obstruction or paravalvular leak. Mean   gradient (S): 24 mm Hg. Peak gradient (S): 47 mm Hg. Valve area   (VTI): 1.19 cm^2. Valve area (Vmax): 1.09 cm^2. Valve area   (Vmean): 1.07 cm^2. - Mitral valve: Calcified annulus. Mildly thickened leaflets . Mild   stenosis. There was moderate regurgitation. - Left atrium: Severely dilated. - Right atrium: Mildly dilated. - Tricuspid valve: There was mild regurgitation. - Pulmonary arteries: PA peak pressure: 51 mm Hg (S). - Inferior vena cava: The vessel was normal in size. The   respirophasic diameter changes were in the normal range (>= 50%),   consistent with normal central venous pressure.  Impressions:  - Compared to a prior study in 10/2016, the TAVR valve appears   stable.  ASSESSMENT AND PLAN:  1.  Atrial fibrillation with RVR. S/p DCCV in December 2016. This is her first recurrence since 2016. Symptomatic with poor rate control. Recommend hospitalization for rate control. Start IV Cardizem. Will increase amiodarone from 100 mg to 400 mg daily.  Continue on Eliquis. Obtain routine labs including CBC, CMET, TSH. Check BNP. Keep NPO after midnight for DCCV tomorrow.   2.  Severe Aortic stenosis. Echo in July showed progression with velocity > 4  cm/sec and mean gradient of 40 mm Hg. Now s/p TAVR in December 2017. Follow up Echo 12/18 with higher than expected gradient (mean gradient of 29 mm Hg) due to patient/ prosthetic mismatch with small annuls size and vigorous LV function.   3. Anemia Fe deficient with GI blood loss secondary to AVMS.  On Eliquis. With AS not surprising that she was found to have AVMs. Also gastric polyps. Check Hgb. No obvious bleeding at this time.  4. Chronic kidney disease stage 3.   5.  S/p right shoulder arthroplasty.   6. Acute on Chronic diastolic CHF. Increased due to development of Afib with RVR. Would give IV lasix on admit and monitor.   7. HTN- BP is controlled.  8. DM type 2.   9. COPD.    Signed, Meldon Hanzlik Martinique, MD  11/12/2017 5:23 PM    [facility] [address], [place], [place], [address] Phone [phone], Fax [phone]

## 2017-11-12 NOTE — Progress Notes (Signed)
Progress Note  Patient Name: Patricia [name] Date of Encounter: 11/12/2017  Primary Cardiologist: [name] Martinique, MD   Subjective   Patient presented for direct admission after seeing Dr. Martinique outpatient today (11/12/17). She reports feeling increased SOB over the past few days and also noted an elevated HR. She tried increasing her home lasix dose without improvement in her SOB. Also has been using her nebulizer machine more frequently without improvement in her SOB. At the clinic today, she was found to be in Afib RVR. On presentation to the hospital, she is still having SOB. Denies CP, palpitations, dizziness, lightheadedness. She has been compliant with her medications and has not missed any doses of eliquis  Inpatient Medications    Scheduled Meds: . amiodarone  300 mg Oral Daily  . [START ON 11/13/2017] amiodarone  400 mg Oral Daily  . apixaban  5 mg Oral BID  . cholecalciferol  1,000 Units Oral Daily  . doxazosin  4 mg Oral Daily  . ferrous sulfate  325 mg Oral BID  . [START ON 11/13/2017] fluticasone furoate-vilanterol  1 puff Inhalation Daily  . [START ON 11/13/2017] furosemide  40 mg Oral Daily  . levothyroxine  88 mcg Oral Daily  . loratadine  10 mg Oral Daily  . metoprolol tartrate  2.5 mg Intravenous Q6H  . [START ON 11/13/2017] mirabegron ER  25 mg Oral Daily  . pantoprazole  40 mg Oral BID  . [START ON 11/13/2017] potassium gluconate  595 mg Oral Daily  . [START ON 11/13/2017] pravastatin  20 mg Oral q1800  . sodium chloride flush  3 mL Intravenous Q12H   Continuous Infusions: . sodium chloride    . diltiazem (CARDIZEM) infusion     PRN Meds: sodium chloride, acetaminophen, albuterol, ondansetron (ZOFRAN) IV, sodium chloride flush   Vital Signs    Vitals:   11/12/17 1824  BP: 101/81  Pulse: (!) 131  Resp: 16  Temp: 98.7 F (37.1 C)  TempSrc: Oral  SpO2: 96%  Weight: 229 lb 3.2 oz (104 kg)  Height: _0  (1.6 m)   No intake or output data in the 24 hours  ending 11/12/17 1931 Filed Weights   11/12/17 1824  Weight: 229 lb 3.2 oz (104 kg)    Telemetry    Afib RVR - Personally Reviewed  ECG    pending - Personally Reviewed  Physical Exam   GEN: No acute distress.   Neck: No JVD, no carotid bruits Cardiac: irregular rate/rhythm, + systolic murmurs, no rubs or gallops.  Respiratory: Clear to auscultation bilaterally, no wheezes/ rales/ rhonchi GI: NABS, obese, soft, nontender, non-distended  MS: No edema; No deformity. Neuro:  Nonfocal, moving all extremities spontaneously Psych: Normal affect   Labs    Chemistry Recent Labs  Lab 11/12/17 1828  NA 139  K 4.8  CL 103  CO2 25  GLUCOSE 202*  BUN 39*  CREATININE 1.91*  CALCIUM 9.2  GFRNONAA 24*  GFRAA 28*  ANIONGAP 11     Hematology Recent Labs  Lab 11/12/17 1828  WBC 8.1  RBC 3.42*  HGB 9.8*  HCT 31.0*  MCV 90.6  MCH 28.7  MCHC 31.6  RDW 16.7*  PLT 221    Cardiac EnzymesNo results for input(s): TROPONINI in the last 168 hours. No results for input(s): TROPIPOC in the last 168 hours.   BNPNo results for input(s): BNP, PROBNP in the last 168 hours.   DDimer No results for input(s): DDIMER in the last 168  hours.   Radiology    No results found.  Patient Profile     82 y.o. female with a PMH  of PAF on Eliquis, COPD, HTN, HLD, anemia, GI bleeds 2/2 AVMS and severe AS s/p TAVR (09/2016), who presents with Afib RVR.  Assessment & Plan    1. Atrial fibrillation with RVR: s/p DCCV 09/2015. This is her first recurrence since 2016.  - Amiodarone increased from 18m daily to 4062mdaily - will give an additional 30041monight  - Start diltiazem gtt  - Continue eliquis - Plan for DCCV tomorrow - NPO after MN   2. Acute on chronic diastolic CHF: likely due to Afib RVR - Will check BNP - IV lasix 84m86mnight per Dr. JordMartiniquessess need for continued IV lasix in AM  3. Anemia: Hx of GI bleed 2/2 AVMS. Without obvious bleeding at this time - Hgb stable  - monitor closely - Continue po iron  4. HTN: BP well controlled - Monitor closely with diltiazem gtt  5. DM: - ISS   6. CKD: baseline 1.74 - Cr mildly elevated on admission - Monitor Cr closely  7. Severe aortic stenosis s/p TAVR - Stable  For questions or updates, please contact [facility] consult [email or web address] for contact info under Cardiology/STEMI.      Signed, KrisAbigail Butts-C  11/12/2017, 7:31 PM   [phone]

## 2017-11-12 NOTE — Patient Instructions (Addendum)
[facility] to call when bed becomes available if no one calls by 8:00 pm go to [facility] ED  Schedule Cardioversion 11/13/17

## 2017-11-13 ENCOUNTER — Encounter (HOSPITAL_COMMUNITY): Payer: Self-pay

## 2017-11-13 ENCOUNTER — Encounter (HOSPITAL_COMMUNITY)
Admission: AD | Disposition: A | Discharge status: Home / Self Care | Payer: Self-pay | Source: Ambulatory Visit | Attending: Cardiology

## 2017-11-13 ENCOUNTER — Observation Stay (HOSPITAL_COMMUNITY): Payer: Medicare Other | Admitting: Certified Registered Nurse Anesthetist

## 2017-11-13 ENCOUNTER — Other Ambulatory Visit: Payer: Medicare Other

## 2017-11-13 DIAGNOSIS — E119 Type 2 diabetes mellitus without complications: Secondary | ICD-10-CM | POA: Diagnosis not present

## 2017-11-13 DIAGNOSIS — I5032 Chronic diastolic (congestive) heart failure: Secondary | ICD-10-CM | POA: Diagnosis not present

## 2017-11-13 DIAGNOSIS — I110 Hypertensive heart disease with heart failure: Secondary | ICD-10-CM | POA: Diagnosis not present

## 2017-11-13 DIAGNOSIS — I481 Persistent atrial fibrillation: Secondary | ICD-10-CM

## 2017-11-13 DIAGNOSIS — J45909 Unspecified asthma, uncomplicated: Secondary | ICD-10-CM | POA: Diagnosis not present

## 2017-11-13 DIAGNOSIS — D649 Anemia, unspecified: Secondary | ICD-10-CM | POA: Diagnosis not present

## 2017-11-13 DIAGNOSIS — I5033 Acute on chronic diastolic (congestive) heart failure: Secondary | ICD-10-CM | POA: Diagnosis not present

## 2017-11-13 DIAGNOSIS — J449 Chronic obstructive pulmonary disease, unspecified: Secondary | ICD-10-CM | POA: Diagnosis not present

## 2017-11-13 DIAGNOSIS — E785 Hyperlipidemia, unspecified: Secondary | ICD-10-CM | POA: Diagnosis not present

## 2017-11-13 DIAGNOSIS — N183 Chronic kidney disease, stage 3 (moderate): Secondary | ICD-10-CM | POA: Diagnosis not present

## 2017-11-13 DIAGNOSIS — I4891 Unspecified atrial fibrillation: Secondary | ICD-10-CM | POA: Diagnosis not present

## 2017-11-13 DIAGNOSIS — E039 Hypothyroidism, unspecified: Secondary | ICD-10-CM | POA: Diagnosis not present

## 2017-11-13 DIAGNOSIS — I130 Hypertensive heart and chronic kidney disease with heart failure and stage 1 through stage 4 chronic kidney disease, or unspecified chronic kidney disease: Secondary | ICD-10-CM | POA: Diagnosis not present

## 2017-11-13 DIAGNOSIS — Z85038 Personal history of other malignant neoplasm of large intestine: Secondary | ICD-10-CM | POA: Diagnosis not present

## 2017-11-13 DIAGNOSIS — I350 Nonrheumatic aortic (valve) stenosis: Secondary | ICD-10-CM | POA: Diagnosis not present

## 2017-11-13 DIAGNOSIS — I480 Paroxysmal atrial fibrillation: Secondary | ICD-10-CM | POA: Diagnosis not present

## 2017-11-13 HISTORY — PX: CARDIOVERSION: SHX1299

## 2017-11-13 LAB — GLUCOSE, CAPILLARY
Glucose-Capillary: 148 mg/dL — ABNORMAL HIGH (ref 65–99)
Glucose-Capillary: 162 mg/dL — ABNORMAL HIGH (ref 65–99)

## 2017-11-13 LAB — CBC
HCT: 30.5 % — ABNORMAL LOW (ref 36.0–46.0)
Hemoglobin: 9.6 g/dL — ABNORMAL LOW (ref 12.0–15.0)
MCH: 28.5 pg (ref 26.0–34.0)
MCHC: 31.5 g/dL (ref 30.0–36.0)
MCV: 90.5 fL (ref 78.0–100.0)
Platelets: 230 10*3/uL (ref 150–400)
RBC: 3.37 MIL/uL — ABNORMAL LOW (ref 3.87–5.11)
RDW: 16.5 % — ABNORMAL HIGH (ref 11.5–15.5)
WBC: 7.6 10*3/uL (ref 4.0–10.5)

## 2017-11-13 LAB — BASIC METABOLIC PANEL
Anion gap: 13 (ref 5–15)
BUN: 46 mg/dL — ABNORMAL HIGH (ref 6–20)
CO2: 24 mmol/L (ref 22–32)
Calcium: 9.1 mg/dL (ref 8.9–10.3)
Chloride: 103 mmol/L (ref 101–111)
Creatinine, Ser: 2.28 mg/dL — ABNORMAL HIGH (ref 0.44–1.00)
GFR calc Af Amer: 22 mL/min — ABNORMAL LOW (ref >60)
GFR calc non Af Amer: 19 mL/min — ABNORMAL LOW (ref >60)
Glucose, Bld: 168 mg/dL — ABNORMAL HIGH (ref 65–99)
Potassium: 4.5 mmol/L (ref 3.5–5.1)
Sodium: 140 mmol/L (ref 135–145)

## 2017-11-13 LAB — MAGNESIUM: Magnesium: 2.1 mg/dL (ref 1.7–2.4)

## 2017-11-13 SURGERY — CARDIOVERSION
Anesthesia: General

## 2017-11-13 MED ORDER — SODIUM CHLORIDE 0.9% FLUSH: 3.0000 mL | Freq: Two times a day (BID) | INTRAVENOUS | Status: DC

## 2017-11-13 MED ORDER — LIDOCAINE HCL (CARDIAC) 20 MG/ML IV SOLN
INTRAVENOUS | Status: DC | PRN
  Administered 2017-11-13: 100 mg via INTRAVENOUS

## 2017-11-13 MED ORDER — SODIUM CHLORIDE 0.9% FLUSH: 3.0000 mL | INTRAVENOUS | Status: DC | PRN

## 2017-11-13 MED ORDER — ALUM & MAG HYDROXIDE-SIMETH 200-200-20 MG/5ML PO SUSP
30.0000 mL | ORAL | Status: DC | PRN
  Administered 2017-11-13: 30 mL via ORAL
  Filled 2017-11-13: qty 30

## 2017-11-13 MED ORDER — GI COCKTAIL ~~LOC~~
30.0000 mL | ORAL | Status: DC | PRN
  Administered 2017-11-13: 30 mL via ORAL
  Filled 2017-11-13: qty 30

## 2017-11-13 MED ORDER — PROPOFOL 10 MG/ML IV BOLUS
INTRAVENOUS | Status: DC | PRN
  Administered 2017-11-13: 60 mg via INTRAVENOUS

## 2017-11-13 MED ORDER — AMIODARONE HCL 200 MG PO TABS: ORAL_TABLET | ORAL | 3 refills | Status: DC

## 2017-11-13 MED ORDER — SODIUM CHLORIDE 0.9 % IV SOLN
INTRAVENOUS | Status: DC
  Administered 2017-11-13: 12:00:00 via INTRAVENOUS

## 2017-11-13 MED ORDER — SODIUM CHLORIDE 0.9 % IV SOLN: 250.0000 mL | INTRAVENOUS | Status: DC

## 2017-11-13 NOTE — Progress Notes (Signed)
 Progress Note  Patient Name: Patricia [name] Date of Encounter: 11/13/2017  Primary Cardiologist: [name] Jordan, MD   Subjective   Feels ok , no cp. Mild SOB.   Inpatient Medications    Scheduled Meds: . amiodarone  400 mg Oral Daily  . apixaban  5 mg Oral BID  . cholecalciferol  1,000 Units Oral Daily  . doxazosin  4 mg Oral Daily  . ferrous sulfate  325 mg Oral BID WC  . fluticasone furoate-vilanterol  1 puff Inhalation Daily  . insulin aspart  0-15 Units Subcutaneous TID WC  . insulin aspart  0-5 Units Subcutaneous QHS  . levothyroxine  88 mcg Oral QAC breakfast  . loratadine  10 mg Oral Daily  . metoprolol tartrate  2.5 mg Intravenous Q6H  . mirabegron ER  25 mg Oral Daily  . pantoprazole  40 mg Oral BID  . pravastatin  20 mg Oral q1800  . sodium chloride flush  3 mL Intravenous Q12H   Continuous Infusions: . sodium chloride    . diltiazem (CARDIZEM) infusion 5 mg/hr (11/12/17 2026)   PRN Meds: sodium chloride, acetaminophen, albuterol, alum & mag hydroxide-simeth, gi cocktail, ondansetron (ZOFRAN) IV, sodium chloride flush   Vital Signs    Vitals:   11/12/17 2255 11/13/17 0224 11/13/17 0509 11/13/17 0824  BP:  (!) 98/58 104/69   Pulse:   (!) 118   Resp:   16   Temp:   98 F (36.7 C)   TempSrc:   Oral   SpO2: 95%  90% 97%  Weight:   228 lb 11.2 oz (103.7 kg)   Height:        Intake/Output Summary (Last 24 hours) at 11/13/2017 0852 Last data filed at 11/13/2017 0807 Gross per 24 hour  Intake 277.83 ml  Output 850 ml  Net -572.17 ml   Filed Weights   11/12/17 1824 11/13/17 0509  Weight: 229 lb 3.2 oz (104 kg) 228 lb 11.2 oz (103.7 kg)    Telemetry    afib - Personally Reviewed  ECG    afib - Personally Reviewed  Physical Exam   GEN: No acute distress.   Neck: No JVD Cardiac: irreg, no murmurs, rubs, or gallops.  Respiratory: Clear to auscultation bilaterally. GI: Soft, nontender, non-distended  MS: No edema; No deformity. Neuro:   Nonfocal  Psych: Normal affect   Labs    Chemistry Recent Labs  Lab 11/12/17 1828  NA 139  K 4.8  CL 103  CO2 25  GLUCOSE 202*  BUN 39*  CREATININE 1.91*  CALCIUM 9.2  PROT 7.1  ALBUMIN 3.7  AST 22  ALT 21  ALKPHOS 58  BILITOT 0.8  GFRNONAA 24*  GFRAA 28*  ANIONGAP 11     Hematology Recent Labs  Lab 11/12/17 1828  WBC 8.1  RBC 3.42*  HGB 9.8*  HCT 31.0*  MCV 90.6  MCH 28.7  MCHC 31.6  RDW 16.7*  PLT 221    Cardiac EnzymesNo results for input(s): TROPONINI in the last 168 hours. No results for input(s): TROPIPOC in the last 168 hours.   BNP Recent Labs  Lab 11/12/17 1828  BNP 839.5*     DDimer No results for input(s): DDIMER in the last 168 hours.   Radiology    No results found.  Cardiac Studies   TAVR 2017  Patient Profile     82 y.o. female PAF on Eliquis, COPD, HTN, HLD, anemia, GI bleeds 2/2 AVMS and severe AS s/p   TAVR (09/2016), who presents with Afib RVR.    Assessment & Plan    Atrial fibrillation with RVR: s/p DCCV 09/2015. This is her first recurrence since 2016.  - Amiodarone increased from 100mg daily to 400mg daily  - diltiazem gtt  - Continue eliquis - Plan for DCCV today 130pm, no misses of Eliquis   Acute on chronic diastolic CHF: likely due to Afib RVR - BNP 839 - IV lasix 40mg given yesterday. BP soft. Will hold further dose.  - Likely will improve post DCCV. Shocked 3 times previously.   Anemia: Hx of GI bleed 2/2 AVMS. Without obvious bleeding at this time - Hgb stable - monitor closely, 9.8 - Continue po iron  HTN: BP well controlled in fact soft.  - Monitor closely with diltiazem gtt  DM: - ISS   CKD: baseline 1.74, now 1.9 - Cr mildly elevated on admission, mild increase with lasix IV. Holding.  - Monitor Cr closely  Severe aortic stenosis s/p TAVR - Stable, non change.  For questions or updates, please contact [facility] Please consult [email or web address] for contact info under  Cardiology/STEMI.      Signed, Mark Skains, MD  11/13/2017, 8:52 AM    

## 2017-11-13 NOTE — Anesthesia Preprocedure Evaluation (Addendum)
Anesthesia Evaluation  Patient identified by MRN, date of birth, ID band Patient awake    Reviewed: Allergy & Precautions, NPO status , Patient's Chart, lab work & pertinent test results  Airway Mallampati: II       Dental  (+) Partial Upper, Partial Lower   Pulmonary neg pulmonary ROS, COPD, former smoker,    Pulmonary exam normal        Cardiovascular hypertension, negative cardio ROS  + dysrhythmias Atrial Fibrillation  Rhythm:Irregular Rate:Normal     Neuro/Psych negative neurological ROS  negative psych ROS   GI/Hepatic negative GI ROS, Neg liver ROS, GERD  ,  Endo/Other  negative endocrine ROSdiabetes  Renal/GU Renal diseasenegative Renal ROS  negative genitourinary   Musculoskeletal negative musculoskeletal ROS (+)   Abdominal   Peds negative pediatric ROS (+)  Hematology negative hematology ROS (+)   Anesthesia Other Findings   Reproductive/Obstetrics negative OB ROS                            Anesthesia Physical Anesthesia Plan  ASA: III  Anesthesia Plan: MAC   Post-op Pain Management:    Induction:   PONV Risk Score and Plan:   Airway Management Planned: Mask, Natural Airway and Nasal Cannula  Additional Equipment:   Intra-op Plan:   Post-operative Plan:   Informed Consent: I have reviewed the patients History and Physical, chart, labs and discussed the procedure including the risks, benefits and alternatives for the proposed anesthesia with the patient or authorized representative who has indicated his/her understanding and acceptance.     Plan Discussed with: CRNA and Anesthesiologist  Anesthesia Plan Comments:         Anesthesia Quick Evaluation

## 2017-11-13 NOTE — Transfer of Care (Signed)
Immediate Anesthesia Transfer of Care Note  Patient: NELANI SCHMELZLE  Procedure(s) Performed: CARDIOVERSION (N/A )  Patient Location: PACU and Cath Lab  Anesthesia Type:General  Level of Consciousness: awake, alert , oriented and patient cooperative  Airway & Oxygen Therapy: Patient Spontanous Breathing and Patient connected to nasal cannula oxygen  Post-op Assessment: Report given to RN and Post -op Vital signs reviewed and stable  Post vital signs: Reviewed and stable  Last Vitals:  Vitals:   11/13/17 1214 11/13/17 1310  BP: 116/76 126/83  Pulse:  (!) 113  Resp:  17  Temp:  36.7 C  SpO2:  92%    Last Pain:  Vitals:   11/13/17 1310  TempSrc: Oral      Patients Stated Pain Goal: 0 (70/96/28 3662)  Complications: No apparent anesthesia complications

## 2017-11-13 NOTE — Progress Notes (Addendum)
Dr. Elson Areas paged the following:  "6E01:Mikowski, K:pt w/ stomach ache. Relieved with Maalox for about 2hrs. Possible to get PRN GI cocktail? Thank you."

## 2017-11-13 NOTE — CV Procedure (Signed)
    Electrical Cardioversion Procedure Note Patricia [name] [id number] 1936-02-20  Procedure: Electrical Cardioversion Indications:  Atrial Fibrillation  Time Out: Verified patient identification, verified procedure,medications/allergies/relevent history reviewed, required imaging and test results available.  Performed  Procedure Details  The patient was NPO after midnight. Anesthesia was administered at the beside  by Dr.Houser with 87m of propofol.  Cardioversion was performed with synchronized biphasic defibrillation via AP pads with 120 joules.  1 attempt(s) were performed.  The patient converted to normal sinus rhythm. The patient tolerated the procedure well   IMPRESSION:  Successful cardioversion of atrial fibrillation    Patricia Furbish1/18/2019, 2:58 PM

## 2017-11-13 NOTE — Interval H&P Note (Signed)
History and Physical Interval Note:  11/13/2017 1:52 PM  Patricia [name]  has presented today for surgery, with the diagnosis of Atrial Fibrillation  The various methods of treatment have been discussed with the patient and family. After consideration of risks, benefits and other options for treatment, the patient has consented to  Procedure(s): CARDIOVERSION (N/A) as a surgical intervention .  The patient's history has been reviewed, patient examined, no change in status, stable for surgery.  I have reviewed the patient's chart and labs.  Questions were answered to the patient's satisfaction.     [employer]

## 2017-11-13 NOTE — H&P (View-Only) (Signed)
Progress Note  Patient Name: Patricia [name] Date of Encounter: 11/13/2017  Primary Cardiologist: [name] Martinique, MD   Subjective   Feels ok , no cp. Mild SOB.   Inpatient Medications    Scheduled Meds: . amiodarone  400 mg Oral Daily  . apixaban  5 mg Oral BID  . cholecalciferol  1,000 Units Oral Daily  . doxazosin  4 mg Oral Daily  . ferrous sulfate  325 mg Oral BID WC  . fluticasone furoate-vilanterol  1 puff Inhalation Daily  . insulin aspart  0-15 Units Subcutaneous TID WC  . insulin aspart  0-5 Units Subcutaneous QHS  . levothyroxine  88 mcg Oral QAC breakfast  . loratadine  10 mg Oral Daily  . metoprolol tartrate  2.5 mg Intravenous Q6H  . mirabegron ER  25 mg Oral Daily  . pantoprazole  40 mg Oral BID  . pravastatin  20 mg Oral q1800  . sodium chloride flush  3 mL Intravenous Q12H   Continuous Infusions: . sodium chloride    . diltiazem (CARDIZEM) infusion 5 mg/hr (11/12/17 2026)   PRN Meds: sodium chloride, acetaminophen, albuterol, alum & mag hydroxide-simeth, gi cocktail, ondansetron (ZOFRAN) IV, sodium chloride flush   Vital Signs    Vitals:   11/12/17 2255 11/13/17 0224 11/13/17 0509 11/13/17 0824  BP:  (!) 98/58 104/69   Pulse:   (!) 118   Resp:   16   Temp:   98 F (36.7 C)   TempSrc:   Oral   SpO2: 95%  90% 97%  Weight:   228 lb 11.2 oz (103.7 kg)   Height:        Intake/Output Summary (Last 24 hours) at 11/13/2017 0852 Last data filed at 11/13/2017 7858 Gross per 24 hour  Intake 277.83 ml  Output 850 ml  Net -572.17 ml   Filed Weights   11/12/17 1824 11/13/17 0509  Weight: 229 lb 3.2 oz (104 kg) 228 lb 11.2 oz (103.7 kg)    Telemetry    afib - Personally Reviewed  ECG    afib - Personally Reviewed  Physical Exam   GEN: No acute distress.   Neck: No JVD Cardiac: irreg, no murmurs, rubs, or gallops.  Respiratory: Clear to auscultation bilaterally. GI: Soft, nontender, non-distended  MS: No edema; No deformity. Neuro:   Nonfocal  Psych: Normal affect   Labs    Chemistry Recent Labs  Lab 11/12/17 1828  NA 139  K 4.8  CL 103  CO2 25  GLUCOSE 202*  BUN 39*  CREATININE 1.91*  CALCIUM 9.2  PROT 7.1  ALBUMIN 3.7  AST 22  ALT 21  ALKPHOS 58  BILITOT 0.8  GFRNONAA 24*  GFRAA 28*  ANIONGAP 11     Hematology Recent Labs  Lab 11/12/17 1828  WBC 8.1  RBC 3.42*  HGB 9.8*  HCT 31.0*  MCV 90.6  MCH 28.7  MCHC 31.6  RDW 16.7*  PLT 221    Cardiac EnzymesNo results for input(s): TROPONINI in the last 168 hours. No results for input(s): TROPIPOC in the last 168 hours.   BNP Recent Labs  Lab 11/12/17 1828  BNP 839.5*     DDimer No results for input(s): DDIMER in the last 168 hours.   Radiology    No results found.  Cardiac Studies   TAVR 2017  Patient Profile     82 y.o. female PAF on Eliquis, COPD, HTN, HLD, anemia, GI bleeds 2/2 AVMS and severe AS s/p  TAVR (09/2016), who presents with Afib RVR.    Assessment & Plan    Atrial fibrillation with RVR: s/p DCCV 09/2015. This is her first recurrence since 2016.  - Amiodarone increased from 133m daily to 4045mdaily  - diltiazem gtt  - Continue eliquis - Plan for DCCV today 130pm, no misses of Eliquis   Acute on chronic diastolic CHF: likely due to Afib RVR - BNP 839 - IV lasix 4037miven yesterday. BP soft. Will hold further dose.  - Likely will improve post DCCV. Shocked 3 times previously.   Anemia: Hx of GI bleed 2/2 AVMS. Without obvious bleeding at this time - Hgb stable - monitor closely, 9.8 - Continue po iron  HTN: BP well controlled in fact soft.  - Monitor closely with diltiazem gtt  DM: - ISS   CKD: baseline 1.74, now 1.9 - Cr mildly elevated on admission, mild increase with lasix IV. Holding.  - Monitor Cr closely  Severe aortic stenosis s/p TAVR - Stable, non change.  For questions or updates, please contact [facility] consult [email or web address] for contact info under  Cardiology/STEMI.      Signed, MarCandee FurbishD  11/13/2017, 8:52 AM

## 2017-11-13 NOTE — Anesthesia Postprocedure Evaluation (Signed)
Anesthesia Post Note  Patient: Patricia [name]  Procedure(s) Performed: CARDIOVERSION (N/A )     Patient location during evaluation: Endoscopy Anesthesia Type: MAC Level of consciousness: awake Respiratory status: spontaneous breathing Cardiovascular status: blood pressure returned to baseline and stable Postop Assessment: no headache Anesthetic complications: no    Last Vitals:  Vitals:   11/13/17 1310 11/13/17 1420  BP: 126/83   Pulse: (!) 113   Resp: 17   Temp: 36.7 C 36.6 C  SpO2: 92% 99%    Last Pain:  Vitals:   11/13/17 1420  TempSrc: Axillary                 Barnet Glasgow

## 2017-11-13 NOTE — Discharge Summary (Signed)
Discharge Summary    Patient ID: Patricia [name],  MRN: [id number], DOB/AGE: 1936-09-18 82 y.o.  Admit date: 11/12/2017 Discharge date: 11/13/2017  Primary Care Provider: Lauree [name] Primary Cardiologist: Patricia Martinique, MD  Discharge Diagnoses    Active Problems:   Atrial fibrillation (Patricia [name])   HTN   DM   HLD  Allergies Allergies  Allergen Reactions  . Codeine Other (See Comments)    "Spaces out" the patient and she cannot walk well  . Vesicare [Solifenacin] Itching    Diagnostic Studies/Procedures    DCCV 11/13/17: Successful cardioversion of atrial fibrillation   Echo 10/14/17: Study Conclusions - Left ventricle: The cavity size was normal. Wall thickness was   increased in a pattern of moderate LVH. Systolic function was   normal. The estimated ejection fraction was in the range of 60%   to 65%. Wall motion was normal; there were no regional wall   motion abnormalities. Diastolic dysfunction with elevated LV   filling pressure. - Aortic valve: s/p TAVR. No obstruction or paravalvular leak. Mean   gradient (S): 24 mm Hg. Peak gradient (S): 47 mm Hg. Valve area   (VTI): 1.19 cm^2. Valve area (Vmax): 1.09 cm^2. Valve area   (Vmean): 1.07 cm^2. - Mitral valve: Calcified annulus. Mildly thickened leaflets . Mild   stenosis. There was moderate regurgitation. - Left atrium: Severely dilated. - Right atrium: Mildly dilated. - Tricuspid valve: There was mild regurgitation. - Pulmonary arteries: PA peak pressure: 51 mm Hg (S). - Inferior vena cava: The vessel was normal in size. The   respirophasic diameter changes were in the normal range (>= 50%),   consistent with normal central venous pressure.  Impressions: - Compared to a prior study in 10/2016, the TAVR valve appears   stable.  History of Present Illness     Patricia [name] is a 82 y.o. female who is seen as a work in for evaluation of increased edema, dyspnea, and tachycardia. She has a history of  HTN, obesity, DM, HL, and anemia. She was admitted to the hospital in December 2016 with increased dyspnea. She was found to be in atrial fibrillation with RVR. She had pulmonary edema. Hgb was 10. Echo showed EF 55-60% with moderate to severe AS. She was anticoagulated with Xarelto and started on amiodarone. She had TEE guided DCCV with return to NSR. She was seen back in the office on 11/20/15 and was still in NSR.   On 12/18/15 she underwent right shoulder arthroplasty. Post op Hgb was 9.6. It subsequently dropped to 7.1. She was transfused. She has been on iron. Hgb improved to 10. She did have EGD and colonoscopy with both gastric and colonic polyps. No bleeding seen.   On follow up in October she noted symptoms of increased dyspnea. 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 Mild pulmonary HTN and elevated filling pressures. She was admitted in November with acute CHF and was diuresed. She was evaluated in valve clinic and underwent transfemoral TAVR on 10/14/16. Post op course was uncomplicated. Repeat Echo in January Showed higher than expected AV prosthesis gradient felt to be related to vigorous LV systolic function and Valve/ patient mismatch. She has been on Eliquis. She has noted moderate improvement in her breathing.   She continues to have issues with COPD and bronchitis- followed by primary care and pulmonary.   In March she was noted to have progressive anemia with dark stools.  Hgb down to 7.4. She was transfused and EGD showed multiple gastric polyps and some oozing AVMs that were cauterized. Once again she was admitted in May with worsening anemia. 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 Transfused 1 units PRBCs. Plan for outpatient capsule endoscopy.   She was seen January 9 with complaints of cough that did not improve with stopping ACEi. Some pulmonary crackles noted at the time. Pulse rate 70 then. CT chest  in December showed emphysema and periapical scarring but no acute infiltrates.   She called yesterday with complaints of increased SOB and increased HR. Seen today. Notes more SOB since Monday. Took extra lasix for 2-3 days without improvement. Since yesterday noted HR was higher. She has been very compliant with medical therapy including Eliquis with no missed doses. She does note some discomfort between her shoulder blades when she takes a deep breath. She denies any swelling or increased weight.   Hospital Course     Consultants: none  She was admitted to cardiology. This Afib occurrence is her first since her last DCCV Dec 2016. She was symptomatic with poor rate control. Amiodarone was increased from 100 mg to 400 mg daily. Diltiazem drip was also started. She continued with poor rate control overnight and was kept NPO for DCCV. She had not missed doses of eliquis and therefore did not require TEE.  She converted to NSR in the 50s with one synchronized shock. This was confirmed with EKG. She recovered from the procedure well. She maintained sinus rhythm.   She was discharged on increased dosing regimen of amiodarone as follows: 200 mg amiodarone BID x 30 days, then 200 mg daily thereafter.   Continue eliquis.    She has been seen by Dr. Marlou [name]  today and deemed ready for discharge home. All follow-up appointments have been scheduled. Discharge medications are listed below.    Discharge Vitals Blood pressure (!) 114/46, pulse (!) 50, temperature 97.8 F (36.6 C), temperature source Axillary, resp. rate 20, height _0  (1.6 m), weight 228 lb (103.4 kg), SpO2 92 %.  Filed Weights   11/12/17 1824 11/13/17 0509 11/13/17 1310  Weight: 229 lb 3.2 oz (104 kg) 228 lb 11.2 oz (103.7 kg) 228 lb (103.4 kg)    Labs & Radiologic Studies    CBC Recent Labs    11/12/17 1828 11/13/17 0904  WBC 8.1 7.6  NEUTROABS 6.6  --   HGB 9.8* 9.6*  HCT 31.0* 30.5*  MCV 90.6 90.5  PLT 221 672    Basic Metabolic Panel Recent Labs    11/12/17 1828 11/13/17 0904  NA 139 140  K 4.8 4.5  CL 103 103  CO2 25 24  GLUCOSE 202* 168*  BUN 39* 46*  CREATININE 1.91* 2.28*  CALCIUM 9.2 9.1  MG  --  2.1   Liver Function Tests Recent Labs    11/12/17 1828  AST 22  ALT 21  ALKPHOS 58  BILITOT 0.8  PROT 7.1  ALBUMIN 3.7   Thyroid Function Tests Recent Labs    11/12/17 1828  TSH 4.734*   _____________  No results found. Disposition   Pt is being discharged home today in good condition.  Follow-up Plans & Appointments    Follow-up Information    Erlene Quan, PA-C Follow up on 11/18/2017.   Specialties:  Cardiology, Radiology Why:  0800 for hospital follow up Contact information: [address] [place] [address] [phone]  Discharge Medications   Allergies as of 11/13/2017      Reactions   Codeine Other (See Comments)   "Spaces out" the patient and she cannot walk well   Vesicare [solifenacin] Itching      Medication List    TAKE these medications   acetaminophen 500 MG tablet Commonly known as:  TYLENOL Take 500 mg by mouth every 6 (six) hours as needed for mild pain.   albuterol (2.5 MG/3ML) 0.083% nebulizer solution Commonly known as:  PROVENTIL Take 3 mLs (2.5 mg total) by nebulization every 4 (four) hours as needed for wheezing or shortness of breath.   albuterol 108 (90 Base) MCG/ACT inhaler Commonly known as:  VENTOLIN HFA Inhale 2 puffs into the lungs every 6 (six) hours as needed for wheezing or shortness of breath.   ALLERGY EYE DROPS OP Place 1 drop into both eyes 3 (three) times daily as needed (allergies).   amiodarone 200 MG tablet Commonly known as:  PACERONE Take 200 mg tablets twice daily for 30 days, then reduce to 200 mg daily thereafter. What changed:    how much to take  how to take this  when to take this  additional instructions   apixaban 5 MG Tabs tablet Commonly known as:   ELIQUIS Take 1 tablet (5 mg total) by mouth 2 (two) times daily.   cetirizine 10 MG tablet Commonly known as:  ZYRTEC Take 10 mg by mouth daily as needed for allergies.   cholecalciferol 1000 units tablet Commonly known as:  VITAMIN D Take 1,000 Units by mouth at bedtime.   doxazosin 4 MG tablet Commonly known as:  CARDURA Take 1 tablet (4 mg total) by mouth daily.   ferrous sulfate 325 (65 FE) MG EC tablet Take 325 mg by mouth 2 (two) times daily.   fluticasone furoate-vilanterol 100-25 MCG/INH Aepb Commonly known as:  BREO ELLIPTA Inhale into the lungs once daily   furosemide 40 MG tablet Commonly known as:  LASIX Take 1 tablet (40 mg total) by mouth daily.   glimepiride 2 MG tablet Commonly known as:  AMARYL Take one tablet by mouth once daily at breakfast to control glucose What changed:    how much to take  how to take this  when to take this  additional instructions   levothyroxine 88 MCG tablet Commonly known as:  SYNTHROID, LEVOTHROID Take 1 tablet (88 mcg total) by mouth daily.   mirabegron ER 25 MG Tb24 tablet Commonly known as:  MYRBETRIQ One daily to help bladder control What changed:    how much to take  how to take this  when to take this  additional instructions   pantoprazole 40 MG tablet Commonly known as:  PROTONIX TAKE 1 TABLET BY MOUTH TWO  TIMES DAILY What changed:    how much to take  how to take this  when to take this   potassium gluconate 595 (99 K) MG Tabs tablet Take 595 mg by mouth daily. TAKES ONLY WHEN USING FUROSEMIDE   pravastatin 20 MG tablet Commonly known as:  PRAVACHOL TAKE 1 TABLET BY MOUTH  DAILY What changed:    how much to take  when to take this   triamcinolone 0.025 % cream Commonly known as:  KENALOG Apply 1 application topically daily as needed (psoriasis).          Outstanding Labs/Studies   Follow up EKG  Duration of Discharge Encounter   Greater than 30 minutes including  physician time.  Signed,Bhavinkumar Rowland, PA- C  Personally seen and examined. Agree with above.  82 year old with paroxysmal atrial fibrillation with successful cardioversion on 11/13/17. Exam reveals sinus rhythm/sinus bradycardia, lungs clear, alert, minimal lower extremity edema.  Obese.  Lab work personally reviewed as above.  Paroxysmal atrial fibrillation -Continue with amiodarone 200 mg twice a day for 30 days then decrease back to 200 mg a day.  The goal is to try to maintain sinus rhythm.  When she is out of rhythm, I think this precipitates chronic diastolic heart failure.  Chronic diastolic heart failure -Should feel better in normal rhythm.  We also diuresed her slightly during this admission.  Increasing her Lasix at home 40 mg once a day.  Watch creatinine.  Has chronic kidney disease and is baseline of 1.74.  Status post TAVR -Doing well.    Anemia - Watch closely for any signs of bleeding.  She had AV malformations previously.  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

## 2017-11-16 ENCOUNTER — Other Ambulatory Visit: Payer: Self-pay | Admitting: Internal Medicine

## 2017-11-16 ENCOUNTER — Telehealth: Payer: Self-pay

## 2017-11-16 ENCOUNTER — Encounter (HOSPITAL_COMMUNITY): Payer: Self-pay | Admitting: Cardiology

## 2017-11-16 ENCOUNTER — Other Ambulatory Visit: Payer: Self-pay | Admitting: *Deleted

## 2017-11-16 DIAGNOSIS — I5032 Chronic diastolic (congestive) heart failure: Secondary | ICD-10-CM

## 2017-11-16 MED ORDER — FUROSEMIDE 40 MG PO TABS: 40.0000 mg | ORAL_TABLET | Freq: Every day | ORAL | 0 refills | Status: DC

## 2017-11-16 NOTE — Telephone Encounter (Signed)
Patient requested refill. Current Medication list Verified. Faxed to pharmacy.

## 2017-11-16 NOTE — Telephone Encounter (Signed)
Transition Care Management Follow-Up Telephone Call   Date discharged and where: [facility] on 11/13/17  How have you been since you were released from the hospital? About a week ago she has had a harder time breathing. Uses her inhaler and neb treatments   Any patient concerns? None  Items Reviewed:   Meds: Y  Allergies: Y  Dietary Changes Reviewed: Y  Functional Questionnaire:  Independent-I Dependent-D  ADLs:   Dressing- I    Eating-I   Maintaining continence-I   Transferring- I   Transportation- I   Meal Prep- I   Managing Meds- I w/ assist  Confirmed importance and Date/Time of follow-up visits scheduled: Cardiology 11/18/2017 @ 8am   Confirmed with patient if condition worsens to call PCP or go to the Emergency Dept. Patient was given office number and encouraged to call back with questions or concerns: yes

## 2017-11-17 ENCOUNTER — Ambulatory Visit: Payer: Self-pay

## 2017-11-17 ENCOUNTER — Encounter: Payer: Self-pay | Admitting: Thoracic Surgery (Cardiothoracic Vascular Surgery)

## 2017-11-17 ENCOUNTER — Ambulatory Visit: Payer: Medicare Other | Admitting: Nurse Practitioner

## 2017-11-18 ENCOUNTER — Ambulatory Visit (INDEPENDENT_AMBULATORY_CARE_PROVIDER_SITE_OTHER): Payer: Medicare Other | Admitting: Cardiology

## 2017-11-18 ENCOUNTER — Encounter: Payer: Self-pay | Admitting: Cardiology

## 2017-11-18 VITALS — BP 122/68 | HR 74 | Ht 63.0 in | Wt 231.0 lb

## 2017-11-18 DIAGNOSIS — I5033 Acute on chronic diastolic (congestive) heart failure: Secondary | ICD-10-CM | POA: Diagnosis not present

## 2017-11-18 DIAGNOSIS — I10 Essential (primary) hypertension: Secondary | ICD-10-CM | POA: Diagnosis not present

## 2017-11-18 DIAGNOSIS — I480 Paroxysmal atrial fibrillation: Secondary | ICD-10-CM | POA: Diagnosis not present

## 2017-11-18 DIAGNOSIS — J449 Chronic obstructive pulmonary disease, unspecified: Secondary | ICD-10-CM

## 2017-11-18 DIAGNOSIS — D649 Anemia, unspecified: Secondary | ICD-10-CM | POA: Diagnosis not present

## 2017-11-18 DIAGNOSIS — N179 Acute kidney failure, unspecified: Secondary | ICD-10-CM | POA: Diagnosis not present

## 2017-11-18 DIAGNOSIS — E662 Morbid (severe) obesity with alveolar hypoventilation: Secondary | ICD-10-CM | POA: Diagnosis not present

## 2017-11-18 DIAGNOSIS — Z952 Presence of prosthetic heart valve: Secondary | ICD-10-CM

## 2017-11-18 NOTE — Assessment & Plan Note (Signed)
Recent echo demonstrated EF 60-65% with good valve function

## 2017-11-18 NOTE — Assessment & Plan Note (Signed)
BMI 41

## 2017-11-18 NOTE — Progress Notes (Signed)
11/18/2017 Patricia [name]   02/21/1936  [id number]  Primary Physician [name], NP Primary Cardiologist: Dr Martinique  HPI:  82 y/o obese female followed by Dr Martinique with a history of PAF, s/p DCCV in Dec 2016. She has been on Amiodarone and Eliquis. Her Amiodarone had been decreased to 100 mg daily several months ago as she had been holding NSR. She presented to the office as an add on 11/13/17 with tachycardia and dyspnea. She was found to be in AF with RVR and she was admitted for DCCV. Her Amiodarone was increased to 200 mg BID x 30 days- then 200 mg daily. She is in the office today for TOC follow up. She feels better, holding NSR. She declined an EKG. She says her "asthma" is bothering her and she is to see her pulmonologist tomorrow.   Pt's other medical problems include morbid obesity, NIDDM, AS-s/p TAVR Dec 2017, moderate CAD, anemia with AVMs, and CRI-3. During her recent hospitalization her Scr was up- 2.28 with a baseline of 1.5.   Current Outpatient Medications  Medication Sig Dispense Refill  . acetaminophen (TYLENOL) 500 MG tablet Take 500 mg by mouth every 6 (six) hours as needed for mild pain.    Marland Kitchen albuterol (PROVENTIL) (2.5 MG/3ML) 0.083% nebulizer solution USE 1 VIAL IN NEBULIZER EVERY 4 HOURS AND AS NEEDED. Generic: VENTOLIN 60 vial 10  . albuterol (VENTOLIN HFA) 108 (90 Base) MCG/ACT inhaler Inhale 2 puffs into the lungs every 6 (six) hours as needed for wheezing or shortness of breath. 1 Inhaler 11  . amiodarone (PACERONE) 200 MG tablet Take 200 mg tablets twice daily for 30 days, then reduce to 200 mg daily thereafter. 60 tablet 3  . apixaban (ELIQUIS) 5 MG TABS tablet Take 1 tablet (5 mg total) by mouth 2 (two) times daily. 180 tablet 1  . cetirizine (ZYRTEC) 10 MG tablet Take 10 mg by mouth daily as needed for allergies.     . cholecalciferol (VITAMIN D) 1000 units tablet Take 1,000 Units by mouth at bedtime.     Marland Kitchen doxazosin (CARDURA) 4 MG tablet Take 1 tablet  (4 mg total) by mouth daily. 30 tablet 0  . ferrous sulfate 325 (65 FE) MG EC tablet Take 325 mg by mouth 2 (two) times daily.    . fluticasone furoate-vilanterol (BREO ELLIPTA) 100-25 MCG/INH AEPB Inhale into the lungs once daily 1 each 4  . furosemide (LASIX) 40 MG tablet Take 1 tablet (40 mg total) by mouth daily. 90 tablet 0  . glimepiride (AMARYL) 2 MG tablet Take one tablet by mouth once daily at breakfast to control glucose (Patient taking differently: Take 2 mg by mouth daily with breakfast. Take one tablet by mouth once daily at breakfast to control glucose) 90 tablet 1  . Ketotifen Fumarate (ALLERGY EYE DROPS OP) Place 1 drop into both eyes 3 (three) times daily as needed (allergies).     Marland Kitchen levothyroxine (SYNTHROID, LEVOTHROID) 88 MCG tablet Take 1 tablet (88 mcg total) by mouth daily. 90 tablet 3  . mirabegron ER (MYRBETRIQ) 25 MG TB24 tablet One daily to help bladder control (Patient taking differently: Take 25 mg by mouth daily. One daily to help bladder control) 90 tablet 5  . pantoprazole (PROTONIX) 40 MG tablet TAKE 1 TABLET BY MOUTH TWO  TIMES DAILY (Patient taking differently: TAKE 40 mg TABLET BY MOUTH TWO  TIMES DAILY) 180 tablet 1  . potassium gluconate 595 (99 K) MG TABS tablet Take  595 mg by mouth daily. TAKES ONLY WHEN USING FUROSEMIDE    . pravastatin (PRAVACHOL) 20 MG tablet TAKE 1 TABLET BY MOUTH  DAILY (Patient taking differently: Take 20 mg by mouth at bedtime. ) 90 tablet 1  . triamcinolone (KENALOG) 0.025 % cream Apply 1 application topically daily as needed (psoriasis). 30 g 0   No current facility-administered medications for this visit.     Allergies  Allergen Reactions  . Codeine Other (See Comments)    "Spaces out" the patient and she cannot walk well  . Vesicare [Solifenacin] Itching    Past Medical History:  Diagnosis Date  . Allergy   . Anemia, unspecified   . Arthritis   . Asthma   . Atrial fibrillation status post cardioversion [facility]) 09/2015    s/p TEE/DCCV>>SR on amio  . Benign neoplasm of colon   . Carpal tunnel syndrome   . Cellulitis and abscess of finger, unspecified   . Cerumen impaction   . Cervicalgia   . CHF (congestive heart failure) ([place])   . Chronic airway obstruction, not elsewhere classified   . Chronic anticoagulation 09/2015   Xarelto for afib, CHADS2VASC=5  . Diarrhea   . Diffuse cystic mastopathy   . Dysrhythmia   . Edema   . Encounter for long-term (current) use of other medications   . GERD (gastroesophageal reflux disease)   . Heart murmur   . Lumbago   . Neck pain 05/23/2015  . Obesity, unspecified   . Other and unspecified hyperlipidemia   . Other psoriasis   . Pain in joint, lower leg   . PONV (postoperative nausea and vomiting)   . Primary localized osteoarthrosis of right shoulder 12/18/2015  . Routine gynecological examination   . 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 Scoliosis (and kyphoscoliosis), idiopathic   . Severe aortic stenosis   . Shortness of breath dyspnea    with exertion  . Type II or unspecified type diabetes mellitus without mention of complication, uncontrolled   . Unspecified essential hypertension   . Unspecified hemorrhoids without mention of complication   . Unspecified hypothyroidism   . Urge incontinence     Social History   Socioeconomic History  . Marital status: Divorced    Spouse name: Not on file  . Number of children: 1  . Years of education: Not on file  . Highest education level: Not on file  Social Needs  . Financial resource strain: Not on file  . Food insecurity - worry: Not on file  . Food insecurity - inability: Not on file  . Transportation needs - medical: Not on file  . Transportation needs - non-medical: Not on file  Occupational History  . Occupation: Retired Market researcher for [employer]  Tobacco Use    . Smoking status: Former Smoker    Packs/day: 1.00    Years: 40.00    Pack years: 40.00    Last attempt to quit: 09/07/1989    Years since quitting: 28.2  . Smokeless tobacco: Never Used  Substance and Sexual Activity  . Alcohol use: No    Alcohol/week: 0.0 oz  . Drug use: No  . Sexual activity: No  Other Topics Concern  . Not on file  Social History Narrative   Her daughter & 3 grandchildren live in the area.       Family History  Problem Relation Age of  Onset  . Diabetes Father   . Stroke Father   . Heart disease Father   . Liver cancer Sister   . Heart disease Brother   . Asthma Sister   . Arthritis Sister      Review of Systems: General: negative for chills, fever, night sweats or weight changes.  Cardiovascular: negative for chest pain, dyspnea on exertion, edema, orthopnea, palpitations, paroxysmal nocturnal dyspnea or shortness of breath Dermatological: negative for rash Respiratory: negative for cough or wheezing Urologic: negative for hematuria Abdominal: negative for nausea, vomiting, diarrhea, bright red blood per rectum, melena, or hematemesis Neurologic: negative for visual changes, syncope, or dizziness All other systems reviewed and are otherwise negative except as noted above.    Blood pressure 122/68, pulse 74, height _0  (1.6 m), weight 231 lb (104.8 kg), SpO2 93 %.  General appearance: alert, cooperative, distracted, no distress and morbidly obese Neck: no JVD Lungs: crackles Rt base, decreased Lt base Heart: regular rate and rhythm and 2/6 systolic murmur Extremities: no edema Skin: pale, cool, dry Neurologic: Grossly normal   ASSESSMENT AND PLAN:   Acute on chronic diastolic heart failure (HCC) Pt's wgt is up 15 lbs from Aug 2018, 3 lbs from her recent discharge. She takes an extra Lasix 40 mg for wgt > 228 and took this last week  AKI (acute kidney injury) ([place]) Recent SCr up 2.2.8  COPD (chronic obstructive pulmonary disease)  ([place]) History of "asthma" follow at by [facility]. I suspect she also has sleep apnea but she was not interested in pursuing this  Obesity hypoventilation syndrome (HCC) BMI 41  S/P TAVR (transcatheter aortic valve replacement) Recent echo demonstrated EF 60-65% with good valve function  Anemia History of AVMs and CRI- last Hgb 9.6   PLAN  Pt is holding NSR. She understands the Amiodarone instructions. I'm concerned she is still volume overloaded. She's convinced its her asthma.  I'll check a BMP and BNP today, she may need further diuresis. She is a little stressed "I've been through enough".   Kerin Ransom PA-C 11/18/2017 8:36 AM

## 2017-11-18 NOTE — Assessment & Plan Note (Signed)
History of AVMs and CRI- last Hgb 9.6

## 2017-11-18 NOTE — Assessment & Plan Note (Signed)
Recent SCr up 2.2.8

## 2017-11-18 NOTE — Patient Instructions (Signed)
Medication Instructions:  Your physician recommends that you continue on your current medications as directed. Please refer to the Current Medication list given to you today.  Labwork: Your physician recommends that you return for lab work in: TODAY-BMP, BNP  Testing/Procedures: None  Follow-Up: Keep upcoming appointment with Dr Martinique.  Any Other Special Instructions Will Be Listed Below (If Applicable).  If you need a refill on your cardiac medications before your next appointment, please call your pharmacy.

## 2017-11-18 NOTE — Assessment & Plan Note (Signed)
Pt's wgt is up 15 lbs from Aug 2018, 3 lbs from her recent discharge. She takes an extra Lasix 40 mg for wgt > 228 and took this last week

## 2017-11-18 NOTE — Assessment & Plan Note (Signed)
History of "asthma" follow at by [facility]. I suspect she also has sleep apnea but she was not interested in pursuing this

## 2017-11-19 ENCOUNTER — Ambulatory Visit (INDEPENDENT_AMBULATORY_CARE_PROVIDER_SITE_OTHER): Payer: Medicare Other | Admitting: Internal Medicine

## 2017-11-19 ENCOUNTER — Encounter: Payer: Self-pay | Admitting: Internal Medicine

## 2017-11-19 VITALS — BP 152/78 | HR 80 | Ht 63.0 in | Wt 212.2 lb

## 2017-11-19 DIAGNOSIS — J449 Chronic obstructive pulmonary disease, unspecified: Secondary | ICD-10-CM | POA: Diagnosis not present

## 2017-11-19 DIAGNOSIS — J209 Acute bronchitis, unspecified: Secondary | ICD-10-CM

## 2017-11-19 LAB — BASIC METABOLIC PANEL
BUN/Creatinine Ratio: 18 (ref 12–28)
BUN: 30 mg/dL — ABNORMAL HIGH (ref 8–27)
CO2: 24 mmol/L (ref 20–29)
Calcium: 9 mg/dL (ref 8.7–10.3)
Chloride: 98 mmol/L (ref 96–106)
Creatinine, Ser: 1.67 mg/dL — ABNORMAL HIGH (ref 0.57–1.00)
GFR calc Af Amer: 33 mL/min/{1.73_m2} — ABNORMAL LOW (ref >59)
GFR calc non Af Amer: 29 mL/min/{1.73_m2} — ABNORMAL LOW (ref >59)
Glucose: 160 mg/dL — ABNORMAL HIGH (ref 65–99)
Potassium: 4.4 mmol/L (ref 3.5–5.2)
Sodium: 140 mmol/L (ref 134–144)

## 2017-11-19 LAB — BRAIN NATRIURETIC PEPTIDE: BNP: 634.9 pg/mL — ABNORMAL HIGH (ref 0.0–100.0)

## 2017-11-19 LAB — NITRIC OXIDE: Nitric Oxide: 24

## 2017-11-19 MED ORDER — CEPHALEXIN 500 MG PO CAPS: 500.0000 mg | ORAL_CAPSULE | Freq: Three times a day (TID) | ORAL | 0 refills | Status: DC

## 2017-11-19 MED ORDER — FLUTICASONE FUROATE-VILANTEROL 100-25 MCG/INH IN AEPB: INHALATION_SPRAY | RESPIRATORY_TRACT | 5 refills | Status: DC

## 2017-11-19 MED ORDER — PREDNISONE 10 MG PO TABS: ORAL_TABLET | ORAL | 0 refills | Status: DC

## 2017-11-19 MED ORDER — ALBUTEROL SULFATE HFA 108 (90 BASE) MCG/ACT IN AERS: 2.0000 | INHALATION_SPRAY | Freq: Four times a day (QID) | RESPIRATORY_TRACT | 11 refills | Status: DC | PRN

## 2017-11-19 NOTE — Addendum Note (Signed)
Addended by: Desmond Dike C on: 11/19/2017 12:41 PM   Modules accepted: Orders

## 2017-11-19 NOTE — Progress Notes (Signed)
Subjective:     Patient ID: Patricia [name], female   DOB: 01-Apr-1936, 82 y.o.   MRN: [id number]  HPI    OV dec 2017 This is the case of Patricia [name], 82 y.o. Female, who Is being seen today for shortness of breath.  As you very well know, patient  is a 82 year old obese white female with history of aortic stenosis, hypertension, atrial fibrillation, coronary artery disease, chronic diastolic congestive heart failure, acute on chronic hypoxic respiratory failure, hyperlipidemia, and GI bleeding with anemia.  She recently had a TAVR a week ago.   Pt had a 52 PY smoking history, quit when she was 82 yrs old. Pt was diagnosed with adult asthma. Triggers: strong odors, pollen, change in weather. Has been on advair 100/50 1P BID for a while. Also on alb as needed.   She was started on O2 2L 24/7 during Thanksgiving admission for SOB.   Pt's SOB is some better after having TAVR. She continues to have episodic SOB.   Pt has snoring, occasional gasping or choking. Has witnessed apneas.  Has hypersomnia, mild. Naps daily.  Hypersomnia affects her fxnality.   ......................................................................Marland Kitchen  ROV 01/26/17 Patient returns to the office as follow-up on her dyspnea. Since last seen, she is overall improved. She barely uses her oxygen now during the daytime. She uses oxygen at bedtime. She checks her O2 saturation and it usually runs in the mid 90s on room air with exertion. Her asthma has also been stable. She is finishing her Advair and will switch to [employer].  because of insurance coverage.   OV 10/15/2017  Chief Complaint  Patient presents with  . Follow-up    Former [employer] pt.  CT scan done 12/13 and is here to go over the results.   Pt states things have been bad since last visit. Pt has had numerous flare-ups with her asthma. Has complaints of SOB and chest discomfort.    82 year old obese female who is to be followed by Dr. Murlean Iba for dyspnea that was  believed to be due to obesity, diastolic dysfunction aortic valve stenosis and asthma versus emphysema.  She also has a 5 mm left upper lobe nodule.  There is a history of hypersomnia  Patient presents for transfer of care after Dr. DDS left the practice.  A year ago she had T AVR procedure for aortic valve stenosis.  After that her dyspnea improved.  However now for the last 1 month her dyspnea is worse but there is no increased cough.  She has an occasional wheezing.  She did see cardiology recently and her BNP is high and increase diuresis has been called in.  She had an echocardiogram I reviewed the chart.  It shows diastolic dysfunction.  She continues to be on Brio for her reported diagnosis of asthma although she is unable to do exam nitric oxide today with [place].  She did have a CT scan of the chest this month 2018 that I personally visualized.  The 5 mm left upper lobe nodule has resolved.  There is no evidence of ILD although this is non-high-resolution CT chest.  She does have some emphysema.  She continues to be on Brio which states works well for her.  I noted that she is on ACE inhibitor    OV 11/19/2017  Chief Complaint  Patient presents with  . Acute Visit    Pt stated breathing has become worse x3 weeks. Pt was in the hosp. 11/12/17 due  to afib. Also has some complaints of mild cough.     82 year old obese female who has multifactorial dyspnea due to obesity, diastolic dysfunction, atrial fibrillation, aortic stenosis and emphysema versus asthma for which she is on Brio.  She had a 5 mm nodule left upper lobe that was resolved  Last visit December 2018.  Since then she is telling me that she has had 3 weeks of worsening shortness of breath.  This is an acute visit.  On November 13, 2017 she got admitted for self palpation of tachycardia.  She was noticed to be in atrial fibrillation with rapid ventricular rhythm.  She says she had electrical cardioversion and discharge.  After the dyspnea  improved a little bit but not much.  There is significant worsening and associated wheezing.  She is using albuterol on a regular basis.  There is no worsening edema orthopnea proximal nocturnal dyspnea or fever or phlegm production.  She did see cardiology yesterday and reported that her asthma was getting worse.  She is compliant with her Brio.  Walking desaturation test today in the office: RA x 3 laps: did only  1 lap and stopped due to knee pain.No tachy. No desats  FeNO 11/19/2017 - 24ppb  Results for Patricia [name], Patricia [name] (MRN [id number]) as of 10/15/2017 12:07  Ref. Range 09/16/2016 14:00  FEV1-Pre Latest Units: L 1.36  FEV1-%Pred-Pre Latest Units: % 74  Pre FEV1/FVC ratio Latest Units: % 71  Results for Patricia [name], Patricia [name] (MRN [id number]) as of 10/15/2017 12:07  Ref. Range 09/16/2016 14:00  TLC Latest Units: L 3.59  TLC % pred Latest Units: % 75  Results for Patricia [name], Patricia [name] (MRN [id number]) as of 10/15/2017 12:07  Ref. Range 09/16/2016 14:00  DLCO unc Latest Units: ml/min/mmHg 12.73  DLCO unc % pred Latest Units: % 59  IMPRESSION: 1. 5 mm nodule seen previously in the lingula has resolved in the interval. 2.  Emphysema. (ICD10-J43.9) 3.  Aortic Atherosclerois (ICD10-170.0)   Electronically Signed   By: Misty Stanley M.D.   On: 10/08/2017 15:55      has a past medical history of Allergy, Anemia, unspecified, Arthritis, Asthma, Atrial fibrillation status post cardioversion [facility]) (09/2015), Benign neoplasm of colon, Carpal tunnel syndrome, Cellulitis and abscess of finger, unspecified, Cerumen impaction, Cervicalgia, CHF (congestive heart failure) ([place]), Chronic airway obstruction, not elsewhere classified, Chronic anticoagulation (09/2015), Diarrhea, Diffuse cystic mastopathy, Dysrhythmia, Edema, Encounter for long-term (current) use of other medications, GERD (gastroesophageal reflux disease), Heart murmur, Lumbago, Neck pain (05/23/2015), Obesity, unspecified, Other and unspecified  hyperlipidemia, Other psoriasis, Pain in joint, lower leg, PONV (postoperative nausea and vomiting), Primary localized osteoarthrosis of right shoulder (12/18/2015), Routine gynecological examination, S/P TAVR (transcatheter aortic valve replacement) (10/14/2016), Scoliosis (and kyphoscoliosis), idiopathic, Severe aortic stenosis, Shortness of breath dyspnea, Type II or unspecified type diabetes mellitus without mention of complication, uncontrolled, Unspecified essential hypertension, Unspecified hemorrhoids without mention of complication, Unspecified hypothyroidism, and Urge incontinence.   reports that she quit smoking about 28 years ago. She has a 40.00 pack-year smoking history. she has never used smokeless tobacco.  Past Surgical History:  Procedure Laterality Date  . ABDOMINAL HYSTERECTOMY  1987   complete  . BREAST BIOPSY Right   . BREAST EXCISIONAL BIOPSY Left 2011  . BREAST EXCISIONAL BIOPSY Right    x2  . BREAST SURGERY     right breast 3 surgeries [phone]  . CARDIAC CATHETERIZATION N/A 09/02/2016   Procedure: Right/Left Heart Cath and Coronary Angiography;  Surgeon: Ander Slade  Martinique, MD;  Location: [place] CV LAB;  Service: Cardiovascular;  Laterality: N/A;  . CARDIOVERSION N/A 10/19/2015   Procedure: CARDIOVERSION;  Surgeon: Lelon Perla, MD;  Location: [facility] ENDOSCOPY;  Service: Cardiovascular;  Laterality: N/A;  . CARDIOVERSION N/A 11/13/2017   Procedure: CARDIOVERSION;  Surgeon: Jerline Pain, MD;  Location: [place];  Service: Cardiovascular;  Laterality: N/A;  . [place]  . COLON SURGERY     2004,2007,2013.  3 times colon surgieres  . ESOPHAGOGASTRODUODENOSCOPY (EGD) WITH PROPOFOL Left 03/13/2017   Procedure: ESOPHAGOGASTRODUODENOSCOPY (EGD) WITH PROPOFOL;  Surgeon: Ronnette Juniper, MD;  Location: [place];  Service: Gastroenterology;  Laterality: Left;  . EXCISE LE MANDIBULAR LYMPH NODE T     DR C. NEWMAN  . FOOT SURGERY  1989  .  LEFT SHOULDER ARTHROSCOPY    . 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 NEUROPLASTY / TRANSPOSITION MEDIAN NERVE AT CARPAL TUNNEL BILATERAL  05/2003  . TEE WITHOUT CARDIOVERSION N/A 10/19/2015   Procedure: Transesophageal Echocardiogram (TEE) ;  Surgeon: Lelon Perla, MD;  Location: [facility] ENDOSCOPY;  Service: Cardiovascular;  Laterality: N/A;  . TEE WITHOUT CARDIOVERSION N/A 10/14/2016   Procedure: TRANSESOPHAGEAL ECHOCARDIOGRAM (TEE);  Surgeon: Sherren Mocha, MD;  Location: [place];  Service: Open Heart Surgery;  Laterality: N/A;  . TOTAL SHOULDER ARTHROPLASTY Right 12/18/2015   Procedure: TOTAL SHOULDER ARTHROPLASTY;  Surgeon: [name], MD;  Location: [place];  Service: Orthopedics;  Laterality: Right;  . TRANSCATHETER AORTIC VALVE REPLACEMENT, TRANSFEMORAL N/A 10/14/2016   Procedure: TRANSCATHETER AORTIC VALVE REPLACEMENT, TRANSFEMORAL;  Surgeon: Sherren Mocha, MD;  Location: [place];  Service: Open Heart Surgery;  Laterality: N/A;  . TRIGGER FINGER RELEASE Left   . VAGINAL CYST REMOVED  1967    Allergies  Allergen Reactions  . Codeine Other (See Comments)    "Spaces out" the patient and she cannot walk well  . Vesicare [Solifenacin] Itching    Immunization History  Administered Date(s) Administered  . DTaP 08/03/2003  . Influenza, High Dose Seasonal PF 06/15/2017  . Influenza,inj,Quad PF,6+ Mos 09/29/2013, 07/19/2014, 09/26/2015, 09/13/2016  . Influenza-Unspecified 11/04/2012  . Pneumococcal Conjugate-13 09/26/2015  . Pneumococcal Polysaccharide-23 12/01/1993, 08/19/2017    Family History  Problem Relation Age of Onset  . Diabetes Father   . Stroke Father   . Heart disease Father   . Liver cancer Sister   . Heart disease Brother   . Asthma Sister   . Arthritis Sister      Current Outpatient Medications:  .  acetaminophen (TYLENOL) 500 MG tablet, Take 500 mg by mouth every 6 (six) hours as needed for mild pain., Disp: , Rfl:  .  albuterol (PROVENTIL) (2.5 MG/3ML) 0.083%  nebulizer solution, USE 1 VIAL IN NEBULIZER EVERY 4 HOURS AND AS NEEDED. Generic: VENTOLIN, Disp: 60 vial, Rfl: 10 .  albuterol (VENTOLIN HFA) 108 (90 Base) MCG/ACT inhaler, Inhale 2 puffs into the lungs every 6 (six) hours as needed for wheezing or shortness of breath., Disp: 1 Inhaler, Rfl: 11 .  amiodarone (PACERONE) 200 MG tablet, Take 200 mg tablets twice daily for 30 days, then reduce to 200 mg daily thereafter., Disp: 60 tablet, Rfl: 3 .  apixaban (ELIQUIS) 5 MG TABS tablet, Take 1 tablet (5 mg total) by mouth 2 (two) times daily., Disp: 180 tablet, Rfl: 1 .  cetirizine (ZYRTEC) 10 MG tablet, Take 10 mg by mouth daily as needed for allergies. , Disp: , Rfl:  .  cholecalciferol (VITAMIN D) 1000  units tablet, Take 1,000 Units by mouth at bedtime. , Disp: , Rfl:  .  doxazosin (CARDURA) 4 MG tablet, Take 1 tablet (4 mg total) by mouth daily., Disp: 30 tablet, Rfl: 0 .  ferrous sulfate 325 (65 FE) MG EC tablet, Take 325 mg by mouth 2 (two) times daily., Disp: , Rfl:  .  fluticasone furoate-vilanterol (BREO ELLIPTA) 100-25 MCG/INH AEPB, Inhale into the lungs once daily, Disp: 1 each, Rfl: 4 .  furosemide (LASIX) 40 MG tablet, Take 1 tablet (40 mg total) by mouth daily., Disp: 90 tablet, Rfl: 0 .  glimepiride (AMARYL) 2 MG tablet, Take one tablet by mouth once daily at breakfast to control glucose (Patient taking differently: Take 2 mg by mouth daily with breakfast. Take one tablet by mouth once daily at breakfast to control glucose), Disp: 90 tablet, Rfl: 1 .  Ketotifen Fumarate (ALLERGY EYE DROPS OP), Place 1 drop into both eyes 3 (three) times daily as needed (allergies). , Disp: , Rfl:  .  levothyroxine (SYNTHROID, LEVOTHROID) 88 MCG tablet, Take 1 tablet (88 mcg total) by mouth daily., Disp: 90 tablet, Rfl: 3 .  mirabegron ER (MYRBETRIQ) 25 MG TB24 tablet, One daily to help bladder control (Patient taking differently: Take 25 mg by mouth daily. One daily to help bladder control), Disp: 90 tablet,  Rfl: 5 .  pantoprazole (PROTONIX) 40 MG tablet, TAKE 1 TABLET BY MOUTH TWO  TIMES DAILY (Patient taking differently: TAKE 40 mg TABLET BY MOUTH TWO  TIMES DAILY), Disp: 180 tablet, Rfl: 1 .  potassium gluconate 595 (99 K) MG TABS tablet, Take 595 mg by mouth daily. TAKES ONLY WHEN USING FUROSEMIDE, Disp: , Rfl:  .  pravastatin (PRAVACHOL) 20 MG tablet, TAKE 1 TABLET BY MOUTH  DAILY (Patient taking differently: Take 20 mg by mouth at bedtime. ), Disp: 90 tablet, Rfl: 1 .  triamcinolone (KENALOG) 0.025 % cream, Apply 1 application topically daily as needed (psoriasis)., Disp: 30 g, Rfl: 0  Review of Systems     Objective:   Physical Exam  Constitutional: She is oriented to person, place, and time. She appears well-developed and well-nourished. No distress.  HENT:  Head: Normocephalic and atraumatic.  Right Ear: External ear normal.  Left Ear: External ear normal.  Mouth/Throat: Oropharynx is clear and moist. No oropharyngeal exudate.  Eyes: Conjunctivae and EOM are normal. Pupils are equal, round, and reactive to light. Right eye exhibits no discharge. Left eye exhibits no discharge. No scleral icterus.  Neck: Normal range of motion. Neck supple. No JVD present. No tracheal deviation present. No thyromegaly present.  Cardiovascular: Normal rate, regular rhythm and intact distal pulses. Exam reveals no gallop and no friction rub.  Murmur heard. Pulmonary/Chest: Effort normal and breath sounds normal. No respiratory distress. She has no wheezes. She has no rales. She exhibits no tenderness.  Abdominal: Soft. Bowel sounds are normal. She exhibits no distension and no mass. There is no tenderness. There is no rebound and no guarding.  Musculoskeletal: Normal range of motion. She exhibits no edema or tenderness.  Lymphadenopathy:    She has no cervical adenopathy.  Neurological: She is alert and oriented to person, place, and time. She has normal reflexes. No cranial nerve deficit. She exhibits  normal muscle tone. Coordination normal.  Skin: Skin is warm and dry. No rash noted. She is not diaphoretic. No erythema. No pallor.  Psychiatric: She has a normal mood and affect. Her behavior is normal. Judgment and thought content normal.  Vitals  reviewed.  Vitals:   11/19/17 1204  BP: (!) 152/78  Pulse: 80  SpO2: 92%  Weight: 212 lb 3.2 oz (96.3 kg)  Height: 5' 3" (1.6 m)    Estimated body mass index is 37.59 kg/m as calculated from the following:   Height as of this encounter: 5' 3" (1.6 m).   Weight as of this encounter: 212 lb 3.2 oz (96.3 kg).     Assessment:       ICD-10-CM   1. Acute bronchitis, unspecified organism J20.9   2. Chronic obstructive pulmonary disease, unspecified COPD type (HCC) J44.9 albuterol (VENTOLIN HFA) 108 (90 Base) MCG/ACT inhaler    fluticasone furoate-vilanterol (BREO ELLIPTA) 100-25 MCG/INH AEPB    Nitric oxide       Plan:      Likely acute bronchitis  PLAN cephalexin 532m three times daily x  5 days  Please take prednisone 40 mg x1 day, then 30 mg x1 day, then 20 mg x1 day, then 10 mg x1 day, and then 5 mg x1 day and stop   Followup  - call if not better or getting worse - can go to ER too  - return in 3-6 months or sooner if needed   Dr. MBrand Males M.D., FGadsden Surgery Center LPC.P Pulmonary and Critical Care Medicine Staff Physician, CDeephavenDirector - Interstitial Lung Disease  Program  [place] [address] Pager: [phone] If no answer or between  15:00h - 7:00h: call [phone] Telephone: [phone]

## 2017-11-19 NOTE — Patient Instructions (Addendum)
ICD-10-CM   1. Acute bronchitis, unspecified organism J20.9   2. Chronic obstructive pulmonary disease, unspecified COPD type (HCC) J44.9 albuterol (VENTOLIN HFA) 108 (90 Base) MCG/ACT inhaler    fluticasone furoate-vilanterol (BREO ELLIPTA) 100-25 MCG/INH AEPB    Nitric oxide   Likely acute bronchitis  PLAN cephalexin 535m three times daily x  5 days  Please take prednisone 40 mg x1 day, then 30 mg x1 day, then 20 mg x1 day, then 10 mg x1 day, and then 5 mg x1 day and stop   Followup  - call if not better or getting worse - can go to ER too  - return in 3-6 months or sooner if needed

## 2017-11-24 ENCOUNTER — Other Ambulatory Visit (HOSPITAL_COMMUNITY): Payer: Self-pay

## 2017-11-30 ENCOUNTER — Telehealth: Payer: Self-pay | Admitting: Internal Medicine

## 2017-11-30 MED ORDER — PREDNISONE 10 MG PO TABS: ORAL_TABLET | ORAL | 0 refills | Status: DC

## 2017-11-30 NOTE — Telephone Encounter (Signed)
Extend prednisone and repeat  by Please take prednisone 40 mg x1 day, then 30 mg x1 day, then 20 mg x1 day, then 10 mg x1 day, and then 5 mg x1 day and stop  No need antibiotic now

## 2017-11-30 NOTE — Telephone Encounter (Signed)
Pt was prescribed prednisone taper and Keflex on 11/19/17. Pt states sx did improve but did not completely subside.  Pt reports of increased sob & wheezing x5d Denies fever, chills, sweats or body aches.   MR please advise. Thanks.

## 2017-11-30 NOTE — Telephone Encounter (Signed)
Spoke with patient. She is aware of MR's recs. Will go ahead and call in the extended prednisone for her.   Nothing else needed at time of call.

## 2017-12-03 ENCOUNTER — Telehealth (HOSPITAL_COMMUNITY): Payer: Self-pay

## 2017-12-03 NOTE — Telephone Encounter (Signed)
Called to speak with patient about Pulmonary Rehab - Patient stated she has acute bronchitis. Patient would like for me to follow up in a couple of weeks.

## 2017-12-07 ENCOUNTER — Ambulatory Visit: Payer: Medicare Other | Admitting: Cardiology

## 2017-12-21 ENCOUNTER — Telehealth: Payer: Self-pay | Admitting: *Deleted

## 2017-12-21 MED ORDER — AMIODARONE HCL 200 MG PO TABS: ORAL_TABLET | ORAL | 2 refills | Status: DC

## 2017-12-21 NOTE — Telephone Encounter (Signed)
Patient left a msg on the refill vm requesting a call back in regards to a medication that she needs refilled. Call back number provided [phone]. Thanks, MI

## 2017-12-22 ENCOUNTER — Other Ambulatory Visit: Payer: Self-pay | Admitting: *Deleted

## 2017-12-22 MED ORDER — PRAVASTATIN SODIUM 20 MG PO TABS: 20.0000 mg | ORAL_TABLET | Freq: Every day | ORAL | 1 refills | Status: DC

## 2017-12-22 NOTE — Telephone Encounter (Signed)
Patient requested refill. Faxed.

## 2017-12-24 ENCOUNTER — Other Ambulatory Visit: Payer: Medicare Other

## 2017-12-24 DIAGNOSIS — N183 Chronic kidney disease, stage 3 unspecified: Secondary | ICD-10-CM

## 2017-12-24 DIAGNOSIS — E039 Hypothyroidism, unspecified: Secondary | ICD-10-CM

## 2017-12-24 DIAGNOSIS — D649 Anemia, unspecified: Secondary | ICD-10-CM

## 2017-12-24 DIAGNOSIS — E119 Type 2 diabetes mellitus without complications: Secondary | ICD-10-CM

## 2017-12-25 LAB — CBC WITH DIFFERENTIAL/PLATELET
Basophils Absolute: 39 cells/uL (ref 0–200)
Basophils Relative: 0.7 %
Eosinophils Absolute: 101 cells/uL (ref 15–500)
Eosinophils Relative: 1.8 %
HCT: 28 % — ABNORMAL LOW (ref 35.0–45.0)
Hemoglobin: 9 g/dL — ABNORMAL LOW (ref 11.7–15.5)
Lymphs Abs: 1607 cells/uL (ref 850–3900)
MCH: 28.2 pg (ref 27.0–33.0)
MCHC: 32.1 g/dL (ref 32.0–36.0)
MCV: 87.8 fL (ref 80.0–100.0)
MPV: 11.1 fL (ref 7.5–12.5)
Monocytes Relative: 10.9 %
Neutro Abs: 3242 cells/uL (ref 1500–7800)
Neutrophils Relative %: 57.9 %
Platelets: 190 10*3/uL (ref 140–400)
RBC: 3.19 10*6/uL — ABNORMAL LOW (ref 3.80–5.10)
RDW: 14.7 % (ref 11.0–15.0)
Total Lymphocyte: 28.7 %
WBC mixed population: 610 cells/uL (ref 200–950)
WBC: 5.6 10*3/uL (ref 3.8–10.8)

## 2017-12-25 LAB — HEMOGLOBIN A1C
Hgb A1c MFr Bld: 7.3 % of total Hgb — ABNORMAL HIGH (ref <5.7)
Mean Plasma Glucose: 163 (calc)
eAG (mmol/L): 9 (calc)

## 2017-12-25 LAB — COMPLETE METABOLIC PANEL WITH GFR
AG Ratio: 1.5 (calc) (ref 1.0–2.5)
ALT: 20 U/L (ref 6–29)
AST: 21 U/L (ref 10–35)
Albumin: 4.1 g/dL (ref 3.6–5.1)
Alkaline phosphatase (APISO): 59 U/L (ref 33–130)
BUN/Creatinine Ratio: 21 (calc) (ref 6–22)
BUN: 35 mg/dL — ABNORMAL HIGH (ref 7–25)
CO2: 32 mmol/L (ref 20–32)
Calcium: 9.2 mg/dL (ref 8.6–10.4)
Chloride: 100 mmol/L (ref 98–110)
Creat: 1.67 mg/dL — ABNORMAL HIGH (ref 0.60–0.93)
GFR, Est African American: 33 mL/min/{1.73_m2} — ABNORMAL LOW (ref >=60)
GFR, Est Non African American: 29 mL/min/{1.73_m2} — ABNORMAL LOW (ref >=60)
Globulin: 2.7 g/dL (calc) (ref 1.9–3.7)
Glucose, Bld: 165 mg/dL — ABNORMAL HIGH (ref 65–99)
Potassium: 3.9 mmol/L (ref 3.5–5.3)
Sodium: 140 mmol/L (ref 135–146)
Total Bilirubin: 0.4 mg/dL (ref 0.2–1.2)
Total Protein: 6.8 g/dL (ref 6.1–8.1)

## 2017-12-25 LAB — TSH: TSH: 5.93 mIU/L — ABNORMAL HIGH (ref 0.40–4.50)

## 2017-12-29 ENCOUNTER — Ambulatory Visit (INDEPENDENT_AMBULATORY_CARE_PROVIDER_SITE_OTHER): Payer: Medicare Other | Admitting: Nurse Practitioner

## 2017-12-29 ENCOUNTER — Encounter: Payer: Self-pay | Admitting: Nurse Practitioner

## 2017-12-29 ENCOUNTER — Telehealth (HOSPITAL_COMMUNITY): Payer: Self-pay

## 2017-12-29 ENCOUNTER — Ambulatory Visit (INDEPENDENT_AMBULATORY_CARE_PROVIDER_SITE_OTHER): Payer: Medicare Other

## 2017-12-29 VITALS — BP 162/78 | HR 70 | Temp 98.0°F | Ht 63.0 in | Wt 226.0 lb

## 2017-12-29 DIAGNOSIS — I5032 Chronic diastolic (congestive) heart failure: Secondary | ICD-10-CM

## 2017-12-29 DIAGNOSIS — J449 Chronic obstructive pulmonary disease, unspecified: Secondary | ICD-10-CM | POA: Diagnosis not present

## 2017-12-29 DIAGNOSIS — E782 Mixed hyperlipidemia: Secondary | ICD-10-CM

## 2017-12-29 DIAGNOSIS — D649 Anemia, unspecified: Secondary | ICD-10-CM | POA: Diagnosis not present

## 2017-12-29 DIAGNOSIS — Z0000 Encounter for general adult medical examination without abnormal findings: Secondary | ICD-10-CM

## 2017-12-29 DIAGNOSIS — I480 Paroxysmal atrial fibrillation: Secondary | ICD-10-CM | POA: Diagnosis not present

## 2017-12-29 DIAGNOSIS — N183 Chronic kidney disease, stage 3 unspecified: Secondary | ICD-10-CM

## 2017-12-29 DIAGNOSIS — E039 Hypothyroidism, unspecified: Secondary | ICD-10-CM

## 2017-12-29 DIAGNOSIS — E119 Type 2 diabetes mellitus without complications: Secondary | ICD-10-CM

## 2017-12-29 NOTE — Progress Notes (Signed)
Careteam: Patient Care Team: [name], NP as PCP - General (Geriatric Medicine) Martinique, [name] M, MD as PCP - Cardiology (Cardiology) [name], MD as Consulting Physician (Orthopedic Surgery) Druscilla Brownie, MD as Consulting Physician (Dermatology) Ralene Bathe, MD as Consulting Physician (Ophthalmology)  Advanced Directive information Does Patient Have a Medical Advance Directive?: Yes, Type of Advance Directive: [place], Does patient want to make changes to medical advance directive?: No - Patient declined  Allergies  Allergen Reactions  . Codeine Other (See Comments)    "Spaces out" the patient and she cannot walk well  . Vesicare [Solifenacin] Itching    Chief Complaint  Patient presents with  . Medical Management of Chronic Issues    Pt is being seen for a 4 month routine visit. Pt reports she has ongoing SOB and weakness.   Marland Kitchen Health Maintenance    Pt due for foot exam     HPI: Patient is a 82 y.o. female seen in the office today for routine follow up.   Since last OV pt was hospitalized for cardioversion of a-fib which was successful. She is currently following with cardiology. Had some adjustments in medication and remains in SR.    Also has been having issues with shortness of breath and following with pulmonary.   Reports ongoing issues with no energy/fatigue and shortness of breath. Unsure if the heart or lungs are causing it but feels like there is an issue. Was treated for acute bronchitis with steroids and antibiotics which did improve symptoms but she is not totally better.   Feels like she needs to follow up sooner with both cardiologist and pulmonologist The shortness of breath is effecting her quality of life. Unable to go it to the grocery store without feeling out of breath and has to wait until she feels like she can make it.   No increase in swelling/edema or chest pains.   DM- does not check fasting blood  sugar, random blood sugar test 150- not fasting. A1c 7.3 up from 5.8 which pt notes she had been on prednisone twice.   Has recently had a lot of food aversions so diet is not great   TSH- 5.93 prior to visit, continues on synthroid   OAB- on myrbetriq 25 mg daily  htn- elevated in office, reports at home  bp 133/56   Review of Systems:  Review of Systems  Constitutional: Positive for malaise/fatigue. Negative for chills, fever and weight loss.  HENT: Negative for congestion and tinnitus.   Respiratory: Positive for shortness of breath (hx of asthma, chronic ongoing follow up with pulmonary). Negative for cough and sputum production.   Cardiovascular: Negative for chest pain, palpitations and leg swelling.  Gastrointestinal: Negative for abdominal pain, constipation, diarrhea and heartburn.  Genitourinary: Negative for dysuria, frequency and urgency.  Musculoskeletal: Negative for back pain, falls, joint pain and myalgias.  Skin: Negative.   Neurological: Negative for dizziness, weakness and headaches.  Endo/Heme/Allergies: Positive for environmental allergies.  Psychiatric/Behavioral: Negative for depression and memory loss. The patient does not have insomnia.     Past Medical History:  Diagnosis Date  . Allergy   . Anemia, unspecified   . Arthritis   . Asthma   . Atrial fibrillation status post cardioversion [facility]) 09/2015   s/p TEE/DCCV>>SR on amio  . Benign neoplasm of colon   . Carpal tunnel syndrome   . Cellulitis and abscess of finger, unspecified   . Cerumen impaction   .  Cervicalgia   . CHF (congestive heart failure) ([place])   . Chronic airway obstruction, not elsewhere classified   . Chronic anticoagulation 09/2015   Xarelto for afib, CHADS2VASC=5  . Diarrhea   . Diffuse cystic mastopathy   . Dysrhythmia   . Edema   . Encounter for long-term (current) use of other medications   . GERD (gastroesophageal reflux disease)   . Heart murmur   . Lumbago   . Neck  pain 05/23/2015  . Obesity, unspecified   . Other and unspecified hyperlipidemia   . Other psoriasis   . Pain in joint, lower leg   . PONV (postoperative nausea and vomiting)   . Primary localized osteoarthrosis of right shoulder 12/18/2015  . Routine gynecological examination   . 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 Scoliosis (and kyphoscoliosis), idiopathic   . Severe aortic stenosis   . Shortness of breath dyspnea    with exertion  . Type II or unspecified type diabetes mellitus without mention of complication, uncontrolled   . Unspecified essential hypertension   . Unspecified hemorrhoids without mention of complication   . Unspecified hypothyroidism   . Urge incontinence    Past Surgical History:  Procedure Laterality Date  . ABDOMINAL HYSTERECTOMY  1987   complete  . BREAST BIOPSY Right   . BREAST EXCISIONAL BIOPSY Left 2011  . BREAST EXCISIONAL BIOPSY Right    x2  . BREAST SURGERY     right breast 3 surgeries [phone]  . CARDIAC CATHETERIZATION N/A 09/02/2016   Procedure: Right/Left Heart Cath and Coronary Angiography;  Surgeon: [name] M Martinique, MD;  Location: [place] [facility];  Service: Cardiovascular;  Laterality: N/A;  . CARDIOVERSION N/A 10/19/2015   Procedure: CARDIOVERSION;  Surgeon: Lelon Perla, MD;  Location: [facility] ENDOSCOPY;  Service: Cardiovascular;  Laterality: N/A;  . CARDIOVERSION N/A 11/13/2017   Procedure: CARDIOVERSION;  Surgeon: Jerline Pain, MD;  Location: [place];  Service: Cardiovascular;  Laterality: N/A;  . Brown  . COLON SURGERY     2004,2007,2013.  3 times colon surgieres  . ESOPHAGOGASTRODUODENOSCOPY (EGD) WITH PROPOFOL Left 03/13/2017   Procedure: ESOPHAGOGASTRODUODENOSCOPY (EGD) WITH PROPOFOL;  Surgeon: Ronnette Juniper, MD;  Location: [place];  Service: Gastroenterology;   Laterality: Left;  . EXCISE LE MANDIBULAR LYMPH NODE T     DR C. NEWMAN  . FOOT SURGERY  1989  . LEFT SHOULDER ARTHROSCOPY    . 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 NEUROPLASTY / TRANSPOSITION MEDIAN NERVE AT CARPAL TUNNEL BILATERAL  05/2003  . TEE WITHOUT CARDIOVERSION N/A 10/19/2015   Procedure: Transesophageal Echocardiogram (TEE) ;  Surgeon: Lelon Perla, MD;  [facility] END[facility]COPY;  Service: Cardiovascular;  Laterality: N/A;  . TEE WITHOUT CARDIOVERSION N/A 10/14/2016   Procedure: TRANSESOPHAGEAL ECHOCARDIOGRAM (TEE);  Surgeon: Sherren Mocha, MD;  Location: [place];  Service: Open Heart Surgery;  Laterality: N/A;  . TOTAL SHOULDER ARTHROPLASTY Right 12/18/2015   Procedure: TOTAL SHOULDER ARTHROPLASTY;  Surgeon: [name], MD;  Location: [place];  Service: Orthopedics;  Laterality: Right;  . TRANSCATHETER AORTIC VALVE REPLACEMENT, TRANSFEMORAL N/A 10/14/2016   Procedure: TRANSCATHETER AORTIC VALVE REPLACEMENT, TRANSFEMORAL;  Surgeon: Sherren Mocha, MD;  Location: [place];  Service: Open Heart Surgery;  Laterality: N/A;  . TRIGGER FINGER RELEASE Left   . VAGINAL CYST REMOVED  1967   Social History:   reports that  she quit smoking about 28 years ago. She has a 40.00 pack-year smoking history. she has never used smokeless tobacco. She reports that she does not drink alcohol or use drugs.  Family History  Problem Relation Age of Onset  . Diabetes Father   . Stroke Father   . Heart disease Father   . Liver cancer Sister   . Heart disease Brother   . Asthma Sister   . Arthritis Sister     Medications: Patient's Medications  New Prescriptions   No medications on file  Previous Medications   ACETAMINOPHEN (TYLENOL) 500 MG TABLET    Take 500 mg by mouth every 6 (six) hours as needed for mild pain.   ALBUTEROL (PROVENTIL) (2.5 MG/3ML) 0.083% NEBULIZER SOLUTION    USE 1 VIAL IN NEBULIZER EVERY 4 HOURS AND AS NEEDED. Generic: VENTOLIN   ALBUTEROL (VENTOLIN HFA) 108 (90 BASE) MCG/ACT  INHALER    Inhale 2 puffs into the lungs every 6 (six) hours as needed for wheezing or shortness of breath.   AMIODARONE (PACERONE) 200 MG TABLET    Take 200 mg by mouth daily.   APIXABAN (ELIQUIS) 5 MG TABS TABLET    Take 1 tablet (5 mg total) by mouth 2 (two) times daily.   CETIRIZINE (ZYRTEC) 10 MG TABLET    Take 10 mg by mouth daily as needed for allergies.    CHOLECALCIFEROL (VITAMIN D) 1000 UNITS TABLET    Take 1,000 Units by mouth at bedtime.    DOXAZOSIN (CARDURA) 4 MG TABLET    Take 1 tablet (4 mg total) by mouth daily.   FERROUS SULFATE 325 (65 FE) MG EC TABLET    Take 325 mg by mouth 2 (two) times daily.   FLUTICASONE FUROATE-VILANTEROL (BREO ELLIPTA) 100-25 MCG/INH AEPB    Inhale into the lungs once daily   FUROSEMIDE (LASIX) 40 MG TABLET    Take 1 tablet (40 mg total) by mouth daily.   GLIMEPIRIDE (AMARYL) 2 MG TABLET    Take one tablet by mouth once daily at breakfast to control glucose   KETOTIFEN FUMARATE (ALLERGY EYE DROPS OP)    Place 1 drop into both eyes 3 (three) times daily as needed (allergies).    LEVOTHYROXINE (SYNTHROID, LEVOTHROID) 88 MCG TABLET    Take 1 tablet (88 mcg total) by mouth daily.   MIRABEGRON ER (MYRBETRIQ) 25 MG TB24 TABLET    One daily to help bladder control   PANTOPRAZOLE (PROTONIX) 40 MG TABLET    TAKE 1 TABLET BY MOUTH TWO  TIMES DAILY   POTASSIUM GLUCONATE 595 (99 K) MG TABS TABLET    Take 595 mg by mouth daily. TAKES ONLY WHEN USING FUROSEMIDE   PRAVASTATIN (PRAVACHOL) 20 MG TABLET    Take 1 tablet (20 mg total) by mouth daily.   TRIAMCINOLONE (KENALOG) 0.025 % CREAM    Apply 1 application topically daily as needed (psoriasis).  Modified Medications   No medications on file  Discontinued Medications   AMIODARONE (PACERONE) 200 MG TABLET    Take 200 mg tablets twice daily for 30 days, then reduce to 200 mg daily thereafter.     Physical Exam:  Vitals:   12/29/17 1428  BP: (!) 162/78  Pulse: 70  Temp: 98 F (36.7 C)  SpO2: 95%  Weight:  226 lb (102.5 kg)  Height: _0  (1.6 m)   Body mass index is 40.03 kg/m.  Physical Exam  Constitutional: She is oriented to person, place, and time. No  distress.  Morbid obesity  HENT:  Head: Normocephalic and atraumatic.  Nose: Nose normal.  Mouth/Throat: Oropharynx is clear and moist.  Eyes: Conjunctivae and EOM are normal.  Neck: Normal range of motion. Neck supple. No tracheal deviation present. No thyromegaly present.  Cardiovascular: Normal rate and regular rhythm. Exam reveals no gallop and no friction rub.  Murmur (2/6 SEM) heard. Pulmonary/Chest: Effort normal and breath sounds normal. No respiratory distress. She has no wheezes. She exhibits no tenderness.  Abdominal: Soft. Bowel sounds are normal. She exhibits no distension and no mass. There is no tenderness.  Musculoskeletal: Normal range of motion. She exhibits no edema or tenderness.  Neurological: She is alert and oriented to person, place, and time. She has normal reflexes. She displays normal reflexes. No cranial nerve deficit. Coordination normal.  Skin: No rash noted. No erythema. No pallor.  Psychiatric: She has a normal mood and affect. Judgment and thought content normal.    Labs reviewed: Basic Metabolic Panel: Recent Labs    08/06/17 0840  11/12/17 1828 11/13/17 0904 11/18/17 0830 12/24/17 0842  NA 138   < > 139 140 140 140  K 4.7   < > 4.8 4.5 4.4 3.9  CL 102   < > 103 103 98 100  CO2 30   < > _0 32  GLUCOSE 157*   < > 202* 168* 160* 165*  BUN 43*   < > 39* 46* 30* 35*  CREATININE 1.45*   < > 1.91* 2.28* 1.67* 1.67*  CALCIUM 9.2   < > 9.2 9.1 9.0 9.2  MG  --   --   --  2.1  --   --   TSH 7.92*  --  4.734*  --   --  5.93*   < > = values in this interval not displayed.   Liver Function Tests: Recent Labs    03/11/17 1448 06/11/17 0832 11/12/17 1828 12/24/17 0842  AST _1 ALT 34 _2 ALKPHOS 52 56 58  --   BILITOT 0.4 0.3 0.8 0.4  PROT 6.6 6.6 7.1 6.8  ALBUMIN 3.5  3.8 3.7  --    No results for input(s): LIPASE, AMYLASE in the last 8760 hours. No results for input(s): AMMONIA in the last 8760 hours. CBC: Recent Labs    08/06/17 0840  11/12/17 1828 11/13/17 0904 12/24/17 0842  WBC 5.9   < > 8.1 7.6 5.6  NEUTROABS 3,634  --  6.6  --  3,242  HGB 9.5*   < > 9.8* 9.6* 9.0*  HCT 29.5*   < > 31.0* 30.5* 28.0*  MCV 83.6   < > 90.6 90.5 87.8  PLT 199   < > 221 230 190   < > = values in this interval not displayed.   Lipid Panel: Recent Labs    06/11/17 0832  CHOL 158  HDL 63  LDLCALC 76  TRIG 97  CHOLHDL 2.5   TSH: Recent Labs    08/06/17 0840 11/12/17 1828 12/24/17 0842  TSH 7.92* 4.734* 5.93*   A1C: Lab Results  Component Value Date   HGBA1C 7.3 (H) 12/24/2017     Assessment/Plan 1. Chronic obstructive pulmonary disease, unspecified COPD type ([place]) Ongoing shortness of breath, following with pulmonary. Currently without wheezing or increase RR during visit but states this does effect her daily and with increase fatigued. Continues on Breo and albuterol PRN  2. Type 2 diabetes mellitus without complication, without  long-term current use of insulin (HCC) Recent increase in A1c however she has had multiple rounds of prednisone in the last 3 months. States appetite is minimal at this time. Will continue current regimen and monitor  - Hemoglobin A1c; Future  3. CKD (chronic kidney disease) stage 3, GFR 30-59 ml/min (HCC) -encouraged proper hydration. Avoid NSAIDS - COMPLETE METABOLIC PANEL WITH GFR; Future  4. Hypothyroidism, unspecified type -continues on synthroid 88 mcg - TSH; Future  5. Anemia, unspecified type -continues on iron supplement  - CBC with Differential/Platelets; Future  6. Paroxysmal atrial fibrillation (HCC) -currently in SR, rate controlled, remains on amiodarone for rate and eliquis for anticoagulation   7. Chronic diastolic CHF (congestive heart failure) (HCC) -euvolemic at this time, continues with  cardiology follow up, continues on lasix   8. Mixed hyperlipidemia -LDL 76, will cont on pravastatin 20 mg daily  - Lipid Panel; Future - COMPLETE METABOLIC PANEL WITH GFR; Future  Next appt: 04/19/2018 Carlos American. Harle Battiest  [facility] [phone] 8 am - 5 pm) [phone] (after hours)

## 2017-12-29 NOTE — Telephone Encounter (Signed)
Attempted to call patient in regards to Pulmonary rehab - lm on vm

## 2017-12-29 NOTE — Patient Instructions (Signed)
Patricia [name] , Thank you for taking time to come for your Medicare Wellness Visit. I appreciate your ongoing commitment to your health goals. Please review the following plan we discussed and let me know if I can assist you in the future.   Screening recommendations/referrals: Colonoscopy excluded, you are over age 80 Mammogram excluded, you are over age 75 Bone Density up to date Recommended yearly ophthalmology/optometry visit for glaucoma screening and checkup Recommended yearly dental visit for hygiene and checkup  Vaccinations: Influenza vaccine up to date, due 2019 fall season Pneumococcal vaccine up to date Tdap vaccine due, declined Shingles vaccine due, declined    Advanced directives: in chart  Conditions/risks identified: none  Next appointment: Patricia Dense, RN 01/03/2019 @ 10am   Preventive Care 65 Years and Older, Female Preventive care refers to lifestyle choices and visits with your health care provider that can promote health and wellness. What does preventive care include?  A yearly physical exam. This is also called an annual well check.  Dental exams once or twice a year.  Routine eye exams. Ask your health care provider how often you should have your eyes checked.  Personal lifestyle choices, including:  Daily care of your teeth and gums.  Regular physical activity.  Eating a healthy diet.  Avoiding tobacco and drug use.  Limiting alcohol use.  Practicing safe sex.  Taking low-dose aspirin every day.  Taking vitamin and mineral supplements as recommended by your health care provider. What happens during an annual well check? The services and screenings done by your health care provider during your annual well check will depend on your age, overall health, lifestyle risk factors, and family history of disease. Counseling  Your health care provider may ask you questions about your:  Alcohol use.  Tobacco use.  Drug use.  Emotional  well-being.  Home and relationship well-being.  Sexual activity.  Eating habits.  History of falls.  Memory and ability to understand (cognition).  Work and work Statistician.  Reproductive health. Screening  You may have the following tests or measurements:  Height, weight, and BMI.  Blood pressure.  Lipid and cholesterol levels. These may be checked every 5 years, or more frequently if you are over 62 years old.  Skin check.  Lung cancer screening. You may have this screening every year starting at age 20 if you have a 30-pack-year history of smoking and currently smoke or have quit within the past 15 years.  Fecal occult blood test (FOBT) of the stool. You may have this test every year starting at age 62.  Flexible sigmoidoscopy or colonoscopy. You may have a sigmoidoscopy every 5 years or a colonoscopy every 10 years starting at age 51.  Hepatitis C blood test.  Hepatitis B blood test.  Sexually transmitted disease (STD) testing.  Diabetes screening. This is done by checking your blood sugar (glucose) after you have not eaten for a while (fasting). You may have this done every 1-3 years.  Bone density scan. This is done to screen for osteoporosis. You may have this done starting at age 80.  Mammogram. This may be done every 1-2 years. Talk to your health care provider about how often you should have regular mammograms. Talk with your health care provider about your test results, treatment options, and if necessary, the need for more tests. Vaccines  Your health care provider may recommend certain vaccines, such as:  Influenza vaccine. This is recommended every year.  Tetanus, diphtheria, and acellular pertussis (Tdap,  Td) vaccine. You may need a Td booster every 10 years.  Zoster vaccine. You may need this after age 51.  Pneumococcal 13-valent conjugate (PCV13) vaccine. One dose is recommended after age 70.  Pneumococcal polysaccharide (PPSV23) vaccine. One  dose is recommended after age 54. Talk to your health care provider about which screenings and vaccines you need and how often you need them. This information is not intended to replace advice given to you by your health care provider. Make sure you discuss any questions you have with your health care provider. Document Released: 11/09/2015 Document Revised: 07/02/2016 Document Reviewed: 08/14/2015 Elsevier Interactive Patient Education  2017 [place] Prevention in the Home Falls can cause injuries. They can happen to people of all ages. There are many things you can do to make your home safe and to help prevent falls. What can I do on the outside of my home?  Regularly fix the edges of walkways and driveways and fix any cracks.  Remove anything that might make you trip as you walk through a door, such as a raised step or threshold.  Trim any bushes or trees on the path to your home.  Use bright outdoor lighting.  Clear any walking paths of anything that might make someone trip, such as rocks or tools.  Regularly check to see if handrails are loose or broken. Make sure that both sides of any steps have handrails.  Any raised decks and porches should have guardrails on the edges.  Have any leaves, snow, or ice cleared regularly.  Use sand or salt on walking paths during winter.  Clean up any spills in your garage right away. This includes oil or grease spills. What can I do in the bathroom?  Use night lights.  Install grab bars by the toilet and in the tub and shower. Do not use towel bars as grab bars.  Use non-skid mats or decals in the tub or shower.  If you need to sit down in the shower, use a plastic, non-slip stool.  Keep the floor dry. Clean up any water that spills on the floor as soon as it happens.  Remove soap buildup in the tub or shower regularly.  Attach bath mats securely with double-sided non-slip rug tape.  Do not have throw rugs and other  things on the floor that can make you trip. What can I do in the bedroom?  Use night lights.  Make sure that you have a light by your bed that is easy to reach.  Do not use any sheets or blankets that are too big for your bed. They should not hang down onto the floor.  Have a firm chair that has side arms. You can use this for support while you get dressed.  Do not have throw rugs and other things on the floor that can make you trip. What can I do in the kitchen?  Clean up any spills right away.  Avoid walking on wet floors.  Keep items that you use a lot in easy-to-reach places.  If you need to reach something above you, use a strong step stool that has a grab bar.  Keep electrical cords out of the way.  Do not use floor polish or wax that makes floors slippery. If you must use wax, use non-skid floor wax.  Do not have throw rugs and other things on the floor that can make you trip. What can I do with my stairs?  Do not  leave any items on the stairs.  Make sure that there are handrails on both sides of the stairs and use them. Fix handrails that are broken or loose. Make sure that handrails are as long as the stairways.  Check any carpeting to make sure that it is firmly attached to the stairs. Fix any carpet that is loose or worn.  Avoid having throw rugs at the top or bottom of the stairs. If you do have throw rugs, attach them to the floor with carpet tape.  Make sure that you have a light switch at the top of the stairs and the bottom of the stairs. If you do not have them, ask someone to add them for you. What else can I do to help prevent falls?  Wear shoes that:  Do not have high heels.  Have rubber bottoms.  Are comfortable and fit you well.  Are closed at the toe. Do not wear sandals.  If you use a stepladder:  Make sure that it is fully opened. Do not climb a closed stepladder.  Make sure that both sides of the stepladder are locked into place.  Ask  someone to hold it for you, if possible.  Clearly mark and make sure that you can see:  Any grab bars or handrails.  First and last steps.  Where the edge of each step is.  Use tools that help you move around (mobility aids) if they are needed. These include:  Canes.  Walkers.  Scooters.  Crutches.  Turn on the lights when you go into a dark area. Replace any light bulbs as soon as they burn out.  Set up your furniture so you have a clear path. Avoid moving your furniture around.  If any of your floors are uneven, fix them.  If there are any pets around you, be aware of where they are.  Review your medicines with your doctor. Some medicines can make you feel dizzy. This can increase your chance of falling. Ask your doctor what other things that you can do to help prevent falls. This information is not intended to replace advice given to you by your health care provider. Make sure you discuss any questions you have with your health care provider. Document Released: 08/09/2009 Document Revised: 03/20/2016 Document Reviewed: 11/17/2014 Elsevier Interactive Patient Education  2017 [employer].

## 2017-12-29 NOTE — Progress Notes (Signed)
Subjective:   Patricia [name] is a 82 y.o. female who presents for Medicare Annual (Subsequent) preventive examination.  Last AWV-09/16/2016       Objective:     Vitals: BP (!) 162/78 (BP Location: Left Arm, Patient Position: Sitting)   Pulse 70   Temp 98 F (36.7 C) (Oral)   Ht 5' 3" (1.6 m)   Wt 226 lb (102.5 kg)   SpO2 90%   BMI 40.03 kg/m   Body mass index is 40.03 kg/m.  Provider notified of BP and O2  Advanced Directives 12/29/2017 11/12/2017 08/24/2017 08/19/2017 06/15/2017 04/06/2017 03/16/2017  Does Patient Have a Medical Advance Directive? _0  Yes No  Type of Paramedic of [place];Living will Healthcare Power of [place] of [place] -  Does patient want to make changes to medical advance directive? No - Patient declined No - Patient declined - - - - -  Copy of [place] in Chart? No - copy requested Yes Yes Yes - - -  Would patient like information on creating a medical advance directive? - - - - - - -    Tobacco Social History   Tobacco Use  Smoking Status Former Smoker  . Packs/day: 1.00  . Years: 40.00  . Pack years: 40.00  . Last attempt to quit: 09/07/1989  . Years since quitting: 28.3  Smokeless Tobacco Never Used     Counseling given: Not Answered   Clinical Intake:  Pre-visit preparation completed: No  Pain : 0-10 Pain Score: 8  Pain Type: Chronic pain Pain Location: Back Pain Orientation: Lower Pain Descriptors / Indicators: Aching Pain Onset: More than a month ago Pain Frequency: Intermittent     Nutritional Risks: None Diabetes: No  How often do you need to have someone help you when you read instructions, pamphlets, or other written materials from your doctor or pharmacy?: 1 - Never What is the last grade level you completed in school?: Some college  Interpreter Needed?:  No  Information entered by :: [name], RN  Past Medical History:  Diagnosis Date  . Allergy   . Anemia, unspecified   . Arthritis   . Asthma   . Atrial fibrillation status post cardioversion [facility]) 09/2015   s/p TEE/DCCV>>SR on amio  . Benign neoplasm of colon   . Carpal tunnel syndrome   . Cellulitis and abscess of finger, unspecified   . Cerumen impaction   . Cervicalgia   . CHF (congestive heart failure) ([place])   . Chronic airway obstruction, not elsewhere classified   . Chronic anticoagulation 09/2015   Xarelto for afib, CHADS2VASC=5  . Diarrhea   . Diffuse cystic mastopathy   . Dysrhythmia   . Edema   . Encounter for long-term (current) use of other medications   . GERD (gastroesophageal reflux disease)   . Heart murmur   . Lumbago   . Neck pain 05/23/2015  . Obesity, unspecified   . Other and unspecified hyperlipidemia   . Other psoriasis   . Pain in joint, lower leg   . PONV (postoperative nausea and vomiting)   . Primary localized osteoarthrosis of right shoulder 12/18/2015  . Routine gynecological examination   . 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 Scoliosis (and kyphoscoliosis), idiopathic   .  Severe aortic stenosis   . Shortness of breath dyspnea    with exertion  . Type II or unspecified type diabetes mellitus without mention of complication, uncontrolled   . Unspecified essential hypertension   . Unspecified hemorrhoids without mention of complication   . Unspecified hypothyroidism   . Urge incontinence    Past Surgical History:  Procedure Laterality Date  . ABDOMINAL HYSTERECTOMY  1987   complete  . BREAST BIOPSY Right   . BREAST EXCISIONAL BIOPSY Left 2011  . BREAST EXCISIONAL BIOPSY Right    x2  . BREAST SURGERY     right breast 3 surgeries [phone]  . CARDIAC CATHETERIZATION N/A 09/02/2016   Procedure:  Right/Left Heart Cath and Coronary Angiography;  Surgeon: [name] M Martinique, MD;  Location: [place] CV LAB;  Service: Cardiovascular;  Laterality: N/A;  . CARDIOVERSION N/A 10/19/2015   Procedure: CARDIOVERSION;  Surgeon: Lelon Perla, MD;  Location: [facility] ENDOSCOPY;  Service: Cardiovascular;  Laterality: N/A;  . CARDIOVERSION N/A 11/13/2017   Procedure: CARDIOVERSION;  Surgeon: Jerline Pain, MD;  Location: [place];  Service: Cardiovascular;  Laterality: N/A;  . [place]  . COLON SURGERY     2004,2007,2013.  3 times colon surgieres  . ESOPHAGOGASTRODUODENOSCOPY (EGD) WITH PROPOFOL Left 03/13/2017   Procedure: ESOPHAGOGASTRODUODENOSCOPY (EGD) WITH PROPOFOL;  Surgeon: Ronnette Juniper, MD;  Location: [place];  Service: Gastroenterology;  Laterality: Left;  . EXCISE LE MANDIBULAR LYMPH NODE T     DR C. NEWMAN  . FOOT SURGERY  1989  . LEFT SHOULDER ARTHROSCOPY    . 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 NEUROPLASTY / TRANSPOSITION MEDIAN NERVE AT CARPAL TUNNEL BILATERAL  05/2003  . TEE WITHOUT CARDIOVERSION N/A 10/19/2015   Procedure: Transesophageal Echocardiogram (TEE) ;  Surgeon: Lelon Perla, MD;  Location: [facility] ENDOSCOPY;  Service: Cardiovascular;  Laterality: N/A;  . TEE WITHOUT CARDIOVERSION N/A 10/14/2016   Procedure: TRANSESOPHAGEAL ECHOCARDIOGRAM (TEE);  Surgeon: Sherren Mocha, MD;  Location: [place];  Service: Open Heart Surgery;  Laterality: N/A;  . TOTAL SHOULDER ARTHROPLASTY Right 12/18/2015   Procedure: TOTAL SHOULDER ARTHROPLASTY;  Surgeon: [name], MD;  Location: [place];  Service: Orthopedics;  Laterality: Right;  . TRANSCATHETER AORTIC VALVE REPLACEMENT, TRANSFEMORAL N/A 10/14/2016   Procedure: TRANSCATHETER AORTIC VALVE REPLACEMENT, TRANSFEMORAL;  Surgeon: Sherren Mocha, MD;  Location: [place];  Service: Open Heart Surgery;  Laterality: N/A;  . TRIGGER FINGER RELEASE Left   . VAGINAL CYST REMOVED  1967   Family History  Problem Relation Age of  Onset  . Diabetes Father   . Stroke Father   . Heart disease Father   . Liver cancer Sister   . Heart disease Brother   . Asthma Sister   . Arthritis Sister    Social History   Socioeconomic History  . Marital status: Divorced    Spouse name: None  . Number of children: 1  . Years of education: None  . Highest education level: None  Social Needs  . Financial resource strain: Not hard at all  . Food insecurity - worry: Never true  . Food insecurity - inability: Never true  . Transportation needs - medical: No  . Transportation needs - non-medical: No  Occupational History  . Occupation: Retired Market researcher for [employer]  Tobacco Use  . Smoking status: Former Smoker    Packs/day: 1.00    Years: 40.00    Pack years: 40.00    Last attempt to  quit: 09/07/1989    Years since quitting: 28.3  . Smokeless tobacco: Never Used  Substance and Sexual Activity  . Alcohol use: No    Alcohol/week: 0.0 oz  . Drug use: No  . Sexual activity: No  Other Topics Concern  . None  Social History Narrative   Her daughter & 3 grandchildren live in the area.      Outpatient Encounter Medications as of 12/29/2017  Medication Sig  . acetaminophen (TYLENOL) 500 MG tablet Take 500 mg by mouth every 6 (six) hours as needed for mild pain.  Marland Kitchen albuterol (PROVENTIL) (2.5 MG/3ML) 0.083% nebulizer solution USE 1 VIAL IN NEBULIZER EVERY 4 HOURS AND AS NEEDED. Generic: VENTOLIN  . albuterol (VENTOLIN HFA) 108 (90 Base) MCG/ACT inhaler Inhale 2 puffs into the lungs every 6 (six) hours as needed for wheezing or shortness of breath.  Marland Kitchen amiodarone (PACERONE) 200 MG tablet Take 200 mg tablets twice daily for 30 days, then reduce to 200 mg daily thereafter. (Patient taking differently: 200 mg daily. Take 200 mg tablets twice daily for 30 days, then reduce to 200 mg daily thereafter.)  . apixaban (ELIQUIS) 5 MG TABS tablet Take 1 tablet (5 mg total) by mouth 2 (two) times daily.  . cetirizine (ZYRTEC)  10 MG tablet Take 10 mg by mouth daily as needed for allergies.   . cholecalciferol (VITAMIN D) 1000 units tablet Take 1,000 Units by mouth at bedtime.   Marland Kitchen doxazosin (CARDURA) 4 MG tablet Take 1 tablet (4 mg total) by mouth daily.  . ferrous sulfate 325 (65 FE) MG EC tablet Take 325 mg by mouth 2 (two) times daily.  . fluticasone furoate-vilanterol (BREO ELLIPTA) 100-25 MCG/INH AEPB Inhale into the lungs once daily  . furosemide (LASIX) 40 MG tablet Take 1 tablet (40 mg total) by mouth daily.  Marland Kitchen glimepiride (AMARYL) 2 MG tablet Take one tablet by mouth once daily at breakfast to control glucose (Patient taking differently: Take 2 mg by mouth daily with breakfast. Take one tablet by mouth once daily at breakfast to control glucose)  . Ketotifen Fumarate (ALLERGY EYE DROPS OP) Place 1 drop into both eyes 3 (three) times daily as needed (allergies).   Marland Kitchen levothyroxine (SYNTHROID, LEVOTHROID) 88 MCG tablet Take 1 tablet (88 mcg total) by mouth daily.  . mirabegron ER (MYRBETRIQ) 25 MG TB24 tablet One daily to help bladder control (Patient taking differently: Take 25 mg by mouth daily. One daily to help bladder control)  . pantoprazole (PROTONIX) 40 MG tablet TAKE 1 TABLET BY MOUTH TWO  TIMES DAILY (Patient taking differently: TAKE 40 mg TABLET BY MOUTH TWO  TIMES DAILY)  . potassium gluconate 595 (99 K) MG TABS tablet Take 595 mg by mouth daily. TAKES ONLY WHEN USING FUROSEMIDE  . pravastatin (PRAVACHOL) 20 MG tablet Take 1 tablet (20 mg total) by mouth daily.  Marland Kitchen triamcinolone (KENALOG) 0.025 % cream Apply 1 application topically daily as needed (psoriasis).  . [DISCONTINUED] cephALEXin (KEFLEX) 500 MG capsule Take 1 capsule (500 mg total) by mouth 3 (three) times daily.  . [DISCONTINUED] predniSONE (DELTASONE) 10 MG tablet Take 4 tablets for 1 day, 3 tablets for 1 day, 2 tablets for 1 day, 1 tablet for 1 day, 0.5 tablet for 1 day then stop   No facility-administered encounter medications on file as of  12/29/2017.     Activities of Daily Living In your present state of health, do you have any difficulty performing the following activities: 12/29/2017 11/12/2017  Hearing? N N  Vision? N N  Difficulty concentrating or making decisions? N N  Walking or climbing stairs? Y Y  Comment - -  Dressing or bathing? N N  Doing errands, shopping? N N  Preparing Food and eating ? N -  Using the Toilet? N -  In the past six months, have you accidently leaked urine? Y -  Do you have problems with loss of bowel control? N -  Managing your Medications? N -  Managing your Finances? N -  Housekeeping or managing your Housekeeping? N -  Some recent data might be hidden    Patient Care Team: [name], NP as PCP - General (Geriatric Medicine) Martinique, [name] M, MD as PCP - Cardiology (Cardiology) [name], MD as Consulting Physician (Orthopedic Surgery) Druscilla Brownie, MD as Consulting Physician (Dermatology) Ralene Bathe, MD as Consulting Physician (Ophthalmology)    Assessment:   This is a routine wellness examination for [facility].  Exercise Activities and Dietary recommendations Current Exercise Habits: The patient does not participate in regular exercise at present, Exercise limited by: None identified  Goals    None      Fall Risk Fall Risk  12/29/2017 08/24/2017 08/19/2017 06/15/2017 04/06/2017  Falls in the past year? _0   Comment - - - - -  Risk for fall due to : - - - - -   Is the patient's home free of loose throw rugs in walkways, pet beds, electrical cords, etc?   yes      Grab bars in the bathroom? no      Handrails on the stairs?   yes      Adequate lighting?   yes   Depression Screen PHQ 2/9 Scores 12/29/2017 02/18/2017 02/09/2017 12/01/2016  PHQ - 2 Score 0 0 0 0     Cognitive Function MMSE - Mini Mental State Exam 12/29/2017 09/16/2016  Orientation to time 3 5  Orientation to Place 5 5  Registration 3 3  Attention/ Calculation 5 3  Recall 1 3    Language- name 2 objects 2 2  Language- repeat 1 1  Language- follow 3 step command 3 3  Language- read & follow direction 1 1  Write a sentence 1 1  Copy design 1 1  Total score 26 28        Immunization History  Administered Date(s) Administered  . DTaP 08/03/2003  . Influenza, High Dose Seasonal PF 06/15/2017  . Influenza,inj,Quad PF,6+ Mos 09/29/2013, 07/19/2014, 09/26/2015, 09/13/2016  . Influenza-Unspecified 11/04/2012  . Pneumococcal Conjugate-13 09/26/2015  . Pneumococcal Polysaccharide-23 12/01/1993, 08/19/2017    Qualifies for Shingles Vaccine? Yes, educated and declined  Screening Tests Health Maintenance  Topic Date Due  . FOOT EXAM  01/24/2016  . TETANUS/TDAP  10/27/2019 (Originally 08/12/1955)  . OPHTHALMOLOGY EXAM  04/26/2018  . URINE MICROALBUMIN  06/11/2018  . HEMOGLOBIN A1C  06/23/2018  . COLONOSCOPY  03/19/2021  . INFLUENZA VACCINE  Completed  . DEXA SCAN  Completed  . PNA vac Low Risk Adult  Completed    Cancer Screenings: Lung: Low Dose CT Chest recommended if Age 48-80 years, 30 pack-year currently smoking OR have quit w/in 15years. Patient does not qualify. Breast:  Up to date on Mammogram? Yes   Up to date of Bone Density/Dexa? Yes Colorectal: up to date  Additional Screenings:  Hepatitis C Screening: declined     Plan:    I have personally reviewed and addressed the Medicare Annual Wellness  questionnaire and have noted the following in the patient's chart:  A. Medical and social history B. Use of alcohol, tobacco or illicit drugs  C. Current medications and supplements D. Functional ability and status E.  Nutritional status F.  Physical activity G. Advance directives H. List of other physicians I.  Hospitalizations, surgeries, and ER visits in previous 12 months J.  [place] to include hearing, vision, cognitive, depression L. Referrals and appointments - none  In addition, I have reviewed and discussed with patient  certain preventive protocols, quality metrics, and best practice recommendations. A written personalized care plan for preventive services as well as general preventive health recommendations were provided to patient.  See attached scanned questionnaire for additional information.   Signed,   [name], RN Nurse Health Advisor  Patient concerns: None

## 2017-12-31 ENCOUNTER — Telehealth (HOSPITAL_COMMUNITY): Payer: Self-pay

## 2017-12-31 NOTE — Telephone Encounter (Signed)
Called to speak with patient in regards to Pulmonary Rehab - Scheduled orientation on 01/29/2018 at 9:30am. Patient will attend the 10:30am exc class. Mailed packet.

## 2018-01-20 ENCOUNTER — Encounter (HOSPITAL_COMMUNITY): Payer: Self-pay | Admitting: Emergency Medicine

## 2018-01-20 ENCOUNTER — Other Ambulatory Visit: Payer: Self-pay

## 2018-01-20 ENCOUNTER — Emergency Department (HOSPITAL_COMMUNITY): Payer: Medicare Other

## 2018-01-20 ENCOUNTER — Inpatient Hospital Stay (HOSPITAL_COMMUNITY)
Admission: EM | Admit: 2018-01-20 | Discharge: 2018-01-26 | DRG: 377 | Disposition: A | Discharge status: Home / Self Care | Payer: Medicare Other | Attending: Internal Medicine | Admitting: Internal Medicine

## 2018-01-20 ENCOUNTER — Inpatient Hospital Stay: Payer: Self-pay

## 2018-01-20 DIAGNOSIS — I130 Hypertensive heart and chronic kidney disease with heart failure and stage 1 through stage 4 chronic kidney disease, or unspecified chronic kidney disease: Secondary | ICD-10-CM | POA: Diagnosis present

## 2018-01-20 DIAGNOSIS — I5043 Acute on chronic combined systolic (congestive) and diastolic (congestive) heart failure: Secondary | ICD-10-CM | POA: Diagnosis present

## 2018-01-20 DIAGNOSIS — I5033 Acute on chronic diastolic (congestive) heart failure: Secondary | ICD-10-CM | POA: Diagnosis not present

## 2018-01-20 DIAGNOSIS — N178 Other acute kidney failure: Secondary | ICD-10-CM | POA: Diagnosis not present

## 2018-01-20 DIAGNOSIS — R531 Weakness: Secondary | ICD-10-CM | POA: Diagnosis not present

## 2018-01-20 DIAGNOSIS — E785 Hyperlipidemia, unspecified: Secondary | ICD-10-CM | POA: Diagnosis present

## 2018-01-20 DIAGNOSIS — D62 Acute posthemorrhagic anemia: Secondary | ICD-10-CM | POA: Diagnosis present

## 2018-01-20 DIAGNOSIS — K922 Gastrointestinal hemorrhage, unspecified: Secondary | ICD-10-CM | POA: Diagnosis present

## 2018-01-20 DIAGNOSIS — I482 Chronic atrial fibrillation: Secondary | ICD-10-CM | POA: Diagnosis present

## 2018-01-20 DIAGNOSIS — Z885 Allergy status to narcotic agent status: Secondary | ICD-10-CM | POA: Diagnosis not present

## 2018-01-20 DIAGNOSIS — I10 Essential (primary) hypertension: Secondary | ICD-10-CM | POA: Diagnosis not present

## 2018-01-20 DIAGNOSIS — N281 Cyst of kidney, acquired: Secondary | ICD-10-CM | POA: Diagnosis not present

## 2018-01-20 DIAGNOSIS — J9601 Acute respiratory failure with hypoxia: Secondary | ICD-10-CM | POA: Diagnosis present

## 2018-01-20 DIAGNOSIS — R0602 Shortness of breath: Secondary | ICD-10-CM | POA: Diagnosis not present

## 2018-01-20 DIAGNOSIS — N179 Acute kidney failure, unspecified: Secondary | ICD-10-CM | POA: Diagnosis present

## 2018-01-20 DIAGNOSIS — I959 Hypotension, unspecified: Secondary | ICD-10-CM | POA: Diagnosis present

## 2018-01-20 DIAGNOSIS — E1122 Type 2 diabetes mellitus with diabetic chronic kidney disease: Secondary | ICD-10-CM | POA: Diagnosis present

## 2018-01-20 DIAGNOSIS — Z7901 Long term (current) use of anticoagulants: Secondary | ICD-10-CM | POA: Diagnosis not present

## 2018-01-20 DIAGNOSIS — N183 Chronic kidney disease, stage 3 unspecified: Secondary | ICD-10-CM | POA: Diagnosis present

## 2018-01-20 DIAGNOSIS — E119 Type 2 diabetes mellitus without complications: Secondary | ICD-10-CM

## 2018-01-20 DIAGNOSIS — Z79890 Hormone replacement therapy (postmenopausal): Secondary | ICD-10-CM | POA: Diagnosis not present

## 2018-01-20 DIAGNOSIS — Z87891 Personal history of nicotine dependence: Secondary | ICD-10-CM

## 2018-01-20 DIAGNOSIS — K319 Disease of stomach and duodenum, unspecified: Secondary | ICD-10-CM | POA: Diagnosis present

## 2018-01-20 DIAGNOSIS — K5900 Constipation, unspecified: Secondary | ICD-10-CM | POA: Diagnosis present

## 2018-01-20 DIAGNOSIS — D649 Anemia, unspecified: Secondary | ICD-10-CM

## 2018-01-20 DIAGNOSIS — Z79899 Other long term (current) drug therapy: Secondary | ICD-10-CM | POA: Diagnosis not present

## 2018-01-20 DIAGNOSIS — Z8249 Family history of ischemic heart disease and other diseases of the circulatory system: Secondary | ICD-10-CM | POA: Diagnosis not present

## 2018-01-20 DIAGNOSIS — I480 Paroxysmal atrial fibrillation: Secondary | ICD-10-CM | POA: Diagnosis present

## 2018-01-20 DIAGNOSIS — Z96611 Presence of right artificial shoulder joint: Secondary | ICD-10-CM | POA: Diagnosis present

## 2018-01-20 DIAGNOSIS — Z833 Family history of diabetes mellitus: Secondary | ICD-10-CM | POA: Diagnosis not present

## 2018-01-20 DIAGNOSIS — R197 Diarrhea, unspecified: Secondary | ICD-10-CM | POA: Diagnosis not present

## 2018-01-20 DIAGNOSIS — E039 Hypothyroidism, unspecified: Secondary | ICD-10-CM | POA: Diagnosis present

## 2018-01-20 DIAGNOSIS — K921 Melena: Principal | ICD-10-CM | POA: Diagnosis present

## 2018-01-20 DIAGNOSIS — K3189 Other diseases of stomach and duodenum: Secondary | ICD-10-CM | POA: Diagnosis not present

## 2018-01-20 DIAGNOSIS — Z952 Presence of prosthetic heart valve: Secondary | ICD-10-CM | POA: Diagnosis not present

## 2018-01-20 DIAGNOSIS — K7689 Other specified diseases of liver: Secondary | ICD-10-CM | POA: Diagnosis not present

## 2018-01-20 DIAGNOSIS — Z888 Allergy status to other drugs, medicaments and biological substances status: Secondary | ICD-10-CM | POA: Diagnosis not present

## 2018-01-20 DIAGNOSIS — D509 Iron deficiency anemia, unspecified: Secondary | ICD-10-CM | POA: Diagnosis not present

## 2018-01-20 DIAGNOSIS — R0902 Hypoxemia: Secondary | ICD-10-CM | POA: Diagnosis not present

## 2018-01-20 LAB — COMPREHENSIVE METABOLIC PANEL
ALT: 16 U/L (ref 14–54)
AST: 21 U/L (ref 15–41)
Albumin: 3 g/dL — ABNORMAL LOW (ref 3.5–5.0)
Alkaline Phosphatase: 40 U/L (ref 38–126)
Anion gap: 14 (ref 5–15)
BUN: 101 mg/dL — ABNORMAL HIGH (ref 6–20)
CO2: 21 mmol/L — ABNORMAL LOW (ref 22–32)
Calcium: 8.3 mg/dL — ABNORMAL LOW (ref 8.9–10.3)
Chloride: 101 mmol/L (ref 101–111)
Creatinine, Ser: 2.35 mg/dL — ABNORMAL HIGH (ref 0.44–1.00)
GFR calc Af Amer: 22 mL/min — ABNORMAL LOW (ref >60)
GFR calc non Af Amer: 19 mL/min — ABNORMAL LOW (ref >60)
Glucose, Bld: 224 mg/dL — ABNORMAL HIGH (ref 65–99)
Potassium: 4.1 mmol/L (ref 3.5–5.1)
Sodium: 136 mmol/L (ref 135–145)
Total Bilirubin: 0.5 mg/dL (ref 0.3–1.2)
Total Protein: 5.8 g/dL — ABNORMAL LOW (ref 6.5–8.1)

## 2018-01-20 LAB — CBC WITH DIFFERENTIAL/PLATELET
Band Neutrophils: 0 %
Basophils Absolute: 0 10*3/uL (ref 0.0–0.1)
Basophils Relative: 0 %
Blasts: 0 %
Eosinophils Absolute: 0.2 10*3/uL (ref 0.0–0.7)
Eosinophils Relative: 1 %
HCT: 13.4 % — ABNORMAL LOW (ref 36.0–46.0)
Hemoglobin: 4.2 g/dL — CL (ref 12.0–15.0)
Lymphocytes Relative: 11 %
Lymphs Abs: 1.7 10*3/uL (ref 0.7–4.0)
MCH: 29.2 pg (ref 26.0–34.0)
MCHC: 31.3 g/dL (ref 30.0–36.0)
MCV: 93.1 fL (ref 78.0–100.0)
Metamyelocytes Relative: 0 %
Monocytes Absolute: 1.2 10*3/uL — ABNORMAL HIGH (ref 0.1–1.0)
Monocytes Relative: 8 %
Myelocytes: 0 %
Neutro Abs: 12.3 10*3/uL — ABNORMAL HIGH (ref 1.7–7.7)
Neutrophils Relative %: 80 %
Other: 0 %
Platelets: 246 10*3/uL (ref 150–400)
Promyelocytes Absolute: 0 %
RBC: 1.44 MIL/uL — ABNORMAL LOW (ref 3.87–5.11)
RDW: 18.4 % — ABNORMAL HIGH (ref 11.5–15.5)
WBC: 15.4 10*3/uL — ABNORMAL HIGH (ref 4.0–10.5)
nRBC: 0 /100 WBC

## 2018-01-20 LAB — PROTIME-INR
INR: 1.44
Prothrombin Time: 17.4 seconds — ABNORMAL HIGH (ref 11.4–15.2)

## 2018-01-20 LAB — I-STAT CHEM 8, ED
BUN: 95 mg/dL — ABNORMAL HIGH (ref 6–20)
Calcium, Ion: 0.98 mmol/L — ABNORMAL LOW (ref 1.15–1.40)
Chloride: 101 mmol/L (ref 101–111)
Creatinine, Ser: 2.4 mg/dL — ABNORMAL HIGH (ref 0.44–1.00)
Glucose, Bld: 215 mg/dL — ABNORMAL HIGH (ref 65–99)
HCT: 32 % — ABNORMAL LOW (ref 36.0–46.0)
Hemoglobin: 10.9 g/dL — ABNORMAL LOW (ref 12.0–15.0)
Potassium: 4.1 mmol/L (ref 3.5–5.1)
Sodium: 135 mmol/L (ref 135–145)
TCO2: 20 mmol/L — ABNORMAL LOW (ref 22–32)

## 2018-01-20 LAB — POC OCCULT BLOOD, ED: Fecal Occult Bld: POSITIVE — AB

## 2018-01-20 LAB — HEMOGLOBIN AND HEMATOCRIT, BLOOD
HCT: 14.2 % — ABNORMAL LOW (ref 36.0–46.0)
Hemoglobin: 4.4 g/dL — CL (ref 12.0–15.0)

## 2018-01-20 LAB — PREPARE RBC (CROSSMATCH)

## 2018-01-20 LAB — I-STAT TROPONIN, ED: Troponin i, poc: 0.03 ng/mL (ref 0.00–0.08)

## 2018-01-20 LAB — LIPASE, BLOOD: Lipase: 29 U/L (ref 11–51)

## 2018-01-20 MED ORDER — LEVOTHYROXINE SODIUM 88 MCG PO TABS
88.0000 ug | ORAL_TABLET | Freq: Every day | ORAL | Status: DC
  Administered 2018-01-21 – 2018-01-26 (×6): 88 ug via ORAL
  Filled 2018-01-20 (×7): qty 1

## 2018-01-20 MED ORDER — SODIUM CHLORIDE 0.9 % IV BOLUS
1000.0000 mL | Freq: Once | INTRAVENOUS | Status: AC
  Administered 2018-01-20: 1000 mL via INTRAVENOUS

## 2018-01-20 MED ORDER — MIRABEGRON ER 25 MG PO TB24
25.0000 mg | ORAL_TABLET | Freq: Every day | ORAL | Status: DC
  Administered 2018-01-21 – 2018-01-26 (×6): 25 mg via ORAL
  Filled 2018-01-20 (×6): qty 1

## 2018-01-20 MED ORDER — FLUTICASONE FUROATE-VILANTEROL 100-25 MCG/INH IN AEPB
1.0000 | INHALATION_SPRAY | Freq: Every day | RESPIRATORY_TRACT | Status: DC
  Administered 2018-01-21 – 2018-01-26 (×4): 1 via RESPIRATORY_TRACT
  Filled 2018-01-20 (×2): qty 28

## 2018-01-20 MED ORDER — SODIUM CHLORIDE 0.9 % IV SOLN
Freq: Once | INTRAVENOUS | Status: AC
  Administered 2018-01-20: 20:00:00 via INTRAVENOUS

## 2018-01-20 MED ORDER — AMIODARONE HCL 200 MG PO TABS
200.0000 mg | ORAL_TABLET | Freq: Every day | ORAL | Status: DC
  Administered 2018-01-21 – 2018-01-26 (×6): 200 mg via ORAL
  Filled 2018-01-20 (×6): qty 1

## 2018-01-20 MED ORDER — ONDANSETRON HCL 4 MG/2ML IJ SOLN
4.0000 mg | Freq: Once | INTRAMUSCULAR | Status: AC
  Administered 2018-01-20: 4 mg via INTRAVENOUS
  Filled 2018-01-20: qty 2

## 2018-01-20 MED ORDER — ONDANSETRON HCL 4 MG PO TABS: 4.0000 mg | ORAL_TABLET | Freq: Four times a day (QID) | ORAL | Status: DC | PRN

## 2018-01-20 MED ORDER — SODIUM CHLORIDE 0.9 % IV SOLN: Freq: Once | INTRAVENOUS | Status: DC

## 2018-01-20 MED ORDER — PANTOPRAZOLE SODIUM 40 MG IV SOLR
8.0000 mg/h | INTRAVENOUS | Status: AC
Start: 2018-01-20 — End: 2018-01-23
  Administered 2018-01-20 – 2018-01-23 (×4): 8 mg/h via INTRAVENOUS
  Filled 2018-01-20 (×8): qty 80

## 2018-01-20 MED ORDER — ACETAMINOPHEN 650 MG RE SUPP: 650.0000 mg | Freq: Four times a day (QID) | RECTAL | Status: DC | PRN

## 2018-01-20 MED ORDER — SODIUM CHLORIDE 0.9 % IV SOLN
80.0000 mg | Freq: Once | INTRAVENOUS | Status: AC
  Administered 2018-01-20: 18:00:00 80 mg via INTRAVENOUS
  Filled 2018-01-20: qty 80

## 2018-01-20 MED ORDER — ACETAMINOPHEN 325 MG PO TABS
650.0000 mg | ORAL_TABLET | Freq: Four times a day (QID) | ORAL | Status: DC | PRN
  Administered 2018-01-20 – 2018-01-24 (×3): 650 mg via ORAL
  Filled 2018-01-20 (×3): qty 2

## 2018-01-20 MED ORDER — DOXAZOSIN MESYLATE 4 MG PO TABS
4.0000 mg | ORAL_TABLET | Freq: Every day | ORAL | Status: DC
  Administered 2018-01-21 – 2018-01-26 (×6): 4 mg via ORAL
  Filled 2018-01-20 (×6): qty 1

## 2018-01-20 MED ORDER — INSULIN ASPART 100 UNIT/ML ~~LOC~~ SOLN
0.0000 [IU] | Freq: Three times a day (TID) | SUBCUTANEOUS | Status: DC
  Administered 2018-01-21: 3 [IU] via SUBCUTANEOUS
  Administered 2018-01-22: 2 [IU] via SUBCUTANEOUS
  Administered 2018-01-22: 5 [IU] via SUBCUTANEOUS
  Administered 2018-01-22 – 2018-01-23 (×3): 3 [IU] via SUBCUTANEOUS
  Administered 2018-01-23: 2 [IU] via SUBCUTANEOUS
  Administered 2018-01-24 (×2): 3 [IU] via SUBCUTANEOUS
  Administered 2018-01-24: 2 [IU] via SUBCUTANEOUS
  Administered 2018-01-25 (×2): 3 [IU] via SUBCUTANEOUS
  Administered 2018-01-25: 2 [IU] via SUBCUTANEOUS
  Administered 2018-01-26: 3 [IU] via SUBCUTANEOUS

## 2018-01-20 MED ORDER — EMPTY CONTAINERS FLEXIBLE MISC
5326.0000 [IU] | Status: AC
  Administered 2018-01-20: 5326 [IU] via INTRAVENOUS
  Filled 2018-01-20: qty 5326

## 2018-01-20 MED ORDER — SODIUM CHLORIDE 0.9 % IV SOLN
INTRAVENOUS | Status: DC
  Administered 2018-01-20 – 2018-01-21 (×2): via INTRAVENOUS

## 2018-01-20 MED ORDER — PRAVASTATIN SODIUM 20 MG PO TABS
20.0000 mg | ORAL_TABLET | Freq: Every day | ORAL | Status: DC
  Administered 2018-01-21 – 2018-01-26 (×6): 20 mg via ORAL
  Filled 2018-01-20 (×6): qty 1

## 2018-01-20 MED ORDER — SODIUM CHLORIDE 0.9% FLUSH
10.0000 mL | INTRAVENOUS | Status: DC | PRN
  Administered 2018-01-22: 20 mL
  Administered 2018-01-22: 10 mL
  Filled 2018-01-20 (×2): qty 40

## 2018-01-20 MED ORDER — PROTHROMBIN COMPLEX CONC HUMAN 500 UNITS IV KIT
5266.0000 [IU] | PACK | INTRAVENOUS | Status: DC
  Filled 2018-01-20: qty 5266

## 2018-01-20 MED ORDER — ALBUTEROL SULFATE (2.5 MG/3ML) 0.083% IN NEBU
2.5000 mg | INHALATION_SOLUTION | RESPIRATORY_TRACT | Status: DC | PRN
  Administered 2018-01-22 – 2018-01-23 (×2): 2.5 mg via RESPIRATORY_TRACT
  Filled 2018-01-20 (×2): qty 3

## 2018-01-20 MED ORDER — COAG FACT XA RECOMBINANT 100 MG IV SOLR: 1800.0000 mg | Freq: Once | INTRAVENOUS | Status: DC

## 2018-01-20 MED ORDER — FENTANYL CITRATE (PF) 100 MCG/2ML IJ SOLN
25.0000 ug | Freq: Once | INTRAMUSCULAR | Status: AC
  Administered 2018-01-20: 25 ug via INTRAVENOUS
  Filled 2018-01-20: qty 2

## 2018-01-20 MED ORDER — FERROUS SULFATE 325 (65 FE) MG PO TABS
325.0000 mg | ORAL_TABLET | Freq: Two times a day (BID) | ORAL | Status: DC
Start: 2018-01-20 — End: 2018-01-26
  Administered 2018-01-20 – 2018-01-26 (×12): 325 mg via ORAL
  Filled 2018-01-20 (×14): qty 1

## 2018-01-20 MED ORDER — ONDANSETRON HCL 4 MG/2ML IJ SOLN
4.0000 mg | Freq: Four times a day (QID) | INTRAMUSCULAR | Status: DC | PRN
  Administered 2018-01-20: 4 mg via INTRAVENOUS
  Filled 2018-01-20: qty 2

## 2018-01-20 NOTE — ED Notes (Signed)
Pt placed on bedpan

## 2018-01-20 NOTE — ED Notes (Signed)
Pt back from X-Ray.

## 2018-01-20 NOTE — ED Provider Notes (Signed)
[place] EMERGENCY DEPARTMENT Provider Note   CSN: [id number] Arrival date & time: 01/20/18  1403     History   Chief Complaint Chief Complaint  Patient presents with  . Diarrhea  . Hypotension    HPI Patricia [name] is a 82 y.o. female.  82 yo F with a chief complaint of nausea vomiting and diarrhea.  This been going on for the past 3 days.  She denies fevers or chills denies abdominal pain.  She has been having some recurrent back pain as well as some difficulty breathing.  Patient states is been going on for some time.  She denies that this is ever been seen by Dr.  She denies bloody stool but says it has been dark because she has been on iron supplementation.  The history is provided by the patient.  Diarrhea   This is a new problem. The current episode started 2 days ago. The problem occurs 2 to 4 times per day. The problem has not changed since onset.There has been no fever. Associated symptoms include myalgias. Pertinent negatives include no vomiting, no chills, no headaches and no arthralgias. She has tried nothing for the symptoms. The treatment provided no relief.    Past Medical History:  Diagnosis Date  . Allergy   . Anemia, unspecified   . Arthritis   . Asthma   . Atrial fibrillation status post cardioversion [facility]) 09/2015   s/p TEE/DCCV>>SR on amio  . Benign neoplasm of colon   . Carpal tunnel syndrome   . Cellulitis and abscess of finger, unspecified   . Cerumen impaction   . Cervicalgia   . CHF (congestive heart failure) ([place])   . Chronic airway obstruction, not elsewhere classified   . Chronic anticoagulation 09/2015   Xarelto for afib, CHADS2VASC=5  . Diarrhea   . Diffuse cystic mastopathy   . Dysrhythmia   . Edema   . Encounter for long-term (current) use of other medications   . GERD (gastroesophageal reflux disease)   . Heart murmur   . Lumbago   . Neck pain 05/23/2015  . Obesity, unspecified   . Other and unspecified  hyperlipidemia   . Other psoriasis   . Pain in joint, lower leg   . PONV (postoperative nausea and vomiting)   . Primary localized osteoarthrosis of right shoulder 12/18/2015  . Routine gynecological examination   . 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 Scoliosis (and kyphoscoliosis), idiopathic   . Severe aortic stenosis   . Shortness of breath dyspnea    with exertion  . Type II or unspecified type diabetes mellitus without mention of complication, uncontrolled   . Unspecified essential hypertension   . Unspecified hemorrhoids without mention of complication   . Unspecified hypothyroidism   . Urge incontinence     Patient Active Problem List   Diagnosis Date Noted  . UGIB (upper gastrointestinal bleed) 03/11/2017  . AKI (acute kidney injury) ([place]) 03/11/2017  . Lung nodule 01/26/2017  . Macrocytosis 01/20/2017  . Hypoxemia 10/24/2016  . Asthma 10/24/2016  . Hypersomnia 10/24/2016  . Obesity hypoventilation syndrome ([place]) 10/24/2016  . 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 SOB (shortness of breath)   . Aortic valve stenosis 09/11/2016  . Acute blood loss anemia 02/06/2016  . Acute on chronic diastolic heart failure ([place]) 02/06/2016  . PAF (paroxysmal atrial fibrillation) ([place]) 02/05/2016  .  Primary localized osteoarthrosis of right shoulder 12/18/2015  . Osteoarthritis of right shoulder 12/18/2015  . Post-nasal drainage 11/27/2015  . Anemia 09/26/2015  . Neck pain 05/23/2015  . Knee pain, bilateral 07/20/2014  . Lumbago 11/02/2013  . Trigger finger, acquired 11/02/2013  . Hyperlipidemia 11/02/2013  . Urinary incontinence 11/02/2013  . Rash and nonspecific skin eruption 11/02/2013  . COPD (chronic obstructive pulmonary disease) ([place])   . Hypothyroidism   . Essential hypertension 03/02/2013  . DM type 2 (diabetes  mellitus, type 2) ([place]) 03/02/2013    Past Surgical History:  Procedure Laterality Date  . ABDOMINAL HYSTERECTOMY  1987   complete  . BREAST BIOPSY Right   . BREAST EXCISIONAL BIOPSY Left 2011  . BREAST EXCISIONAL BIOPSY Right    x2  . BREAST SURGERY     right breast 3 surgeries [phone]  . CARDIAC CATHETERIZATION N/A 09/02/2016   Procedure: Right/Left Heart Cath and Coronary Angiography;  Surgeon: [name] M Martinique, MD;  Location: [place] CV LAB;  Service: Cardiovascular;  Laterality: N/A;  . CARDIOVERSION N/A 10/19/2015   Procedure: CARDIOVERSION;  Surgeon: Lelon Perla, MD;  Location: [facility] ENDOSCOPY;  Service: Cardiovascular;  Laterality: N/A;  . CARDIOVERSION N/A 11/13/2017   Procedure: CARDIOVERSION;  Surgeon: Jerline Pain, MD;  Location: [place];  Service: Cardiovascular;  Laterality: N/A;  . [place]  . COLON SURGERY     2004,2007,2013.  3 times colon surgieres  . ESOPHAGOGASTRODUODENOSCOPY (EGD) WITH PROPOFOL Left 03/13/2017   Procedure: ESOPHAGOGASTRODUODENOSCOPY (EGD) WITH PROPOFOL;  Surgeon: Ronnette Juniper, MD;  Location: [place];  Service: Gastroenterology;  Laterality: Left;  . EXCISE LE MANDIBULAR LYMPH NODE T     DR C. NEWMAN  . FOOT SURGERY  1989  . LEFT SHOULDER ARTHROSCOPY    . 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 NEUROPLASTY / TRANSPOSITION MEDIAN NERVE AT CARPAL TUNNEL BILATERAL  05/2003  . TEE WITHOUT CARDIOVERSION N/A 10/19/2015   Procedure: Transesophageal Echocardiogram (TEE) ;  Surgeon: Lelon Perla, MD;  Location: [facility] ENDOSCOPY;  Service: Cardiovascular;  Laterality: N/A;  . TEE WITHOUT CARDIOVERSION N/A 10/14/2016   Procedure: TRANSESOPHAGEAL ECHOCARDIOGRAM (TEE);  Surgeon: Sherren Mocha, MD;  Location: [place];  Service: Open Heart Surgery;  Laterality: N/A;  . TOTAL SHOULDER ARTHROPLASTY Right 12/18/2015   Procedure: TOTAL SHOULDER ARTHROPLASTY;  Surgeon: [name], MD;  Location: [place];  Service: Orthopedics;   Laterality: Right;  . TRANSCATHETER AORTIC VALVE REPLACEMENT, TRANSFEMORAL N/A 10/14/2016   Procedure: TRANSCATHETER AORTIC VALVE REPLACEMENT, TRANSFEMORAL;  Surgeon: Sherren Mocha, MD;  Location: [place];  Service: Open Heart Surgery;  Laterality: N/A;  . TRIGGER FINGER RELEASE Left   . VAGINAL CYST REMOVED  1967     OB History   None      Home Medications    Prior to Admission medications   Medication Sig Start Date End Date Taking? Authorizing Provider  acetaminophen (TYLENOL) 500 MG tablet Take 500 mg by mouth every 6 (six) hours as needed for mild pain.    [provider]  albuterol (PROVENTIL) (2.5 MG/3ML) 0.083% nebulizer solution USE 1 VIAL IN NEBULIZER EVERY 4 HOURS AND AS NEEDED. Generic: VENTOLIN 11/16/17   Brand Males, MD  albuterol (VENTOLIN HFA) 108 (90 Base) MCG/ACT inhaler Inhale 2 puffs into the lungs every 6 (six) hours as needed for wheezing or shortness of breath. 11/19/17   Brand Males, MD  amiodarone (PACERONE) 200 MG tablet Take 200 mg by mouth daily.  [provider]  apixaban (ELIQUIS) 5 MG TABS tablet Take 1 tablet (5 mg total) by mouth 2 (two) times daily. 11/04/17   Martinique, [name] M, MD  cetirizine (ZYRTEC) 10 MG tablet Take 10 mg by mouth daily as needed for allergies.     [provider]  cholecalciferol (VITAMIN D) 1000 units tablet Take 1,000 Units by mouth at bedtime.     [provider]  doxazosin (CARDURA) 4 MG tablet Take 1 tablet (4 mg total) by mouth daily. 04/07/17   [name], NP  ferrous sulfate 325 (65 FE) MG EC tablet Take 325 mg by mouth 2 (two) times daily.    [provider]  fluticasone furoate-vilanterol (BREO ELLIPTA) 100-25 MCG/INH AEPB Inhale into the lungs once daily 11/19/17   Brand Males, MD  furosemide (LASIX) 40 MG tablet Take 1 tablet (40 mg total) by mouth daily. 11/16/17   [name], NP  glimepiride (AMARYL) 2 MG tablet Take one tablet by mouth once daily at  breakfast to control glucose 08/17/17   [name], NP  Ketotifen Fumarate (ALLERGY EYE DROPS OP) Place 1 drop into both eyes 3 (three) times daily as needed (allergies).     [provider]  levothyroxine (SYNTHROID, LEVOTHROID) 88 MCG tablet Take 1 tablet (88 mcg total) by mouth daily. 08/11/17   [name], NP  mirabegron ER (MYRBETRIQ) 25 MG TB24 tablet One daily to help bladder control 01/20/17   Estill Dooms, MD  pantoprazole (PROTONIX) 40 MG tablet TAKE 1 TABLET BY MOUTH TWO  TIMES DAILY 10/29/17   [name], NP  potassium gluconate 595 (99 K) MG TABS tablet Take 595 mg by mouth daily. TAKES ONLY WHEN USING FUROSEMIDE    [provider]  pravastatin (PRAVACHOL) 20 MG tablet Take 1 tablet (20 mg total) by mouth daily. 12/22/17   [name], NP  triamcinolone (KENALOG) 0.025 % cream Apply 1 application topically daily as needed (psoriasis). 04/06/17   [name], NP    Family History Family History  Problem Relation Age of Onset  . Diabetes Father   . Stroke Father   . Heart disease Father   . Liver cancer Sister   . Heart disease Brother   . Asthma Sister   . Arthritis Sister     Social History Social History   Tobacco Use  . Smoking status: Former Smoker    Packs/day: 1.00    Years: 40.00    Pack years: 40.00    Last attempt to quit: 09/07/1989    Years since quitting: 28.3  . Smokeless tobacco: Never Used  Substance Use Topics  . Alcohol use: No    Alcohol/week: 0.0 oz  . Drug use: No     Allergies   Codeine and Vesicare [solifenacin]   Review of Systems Review of Systems  Constitutional: Positive for fatigue. Negative for chills and fever.  HENT: Negative for congestion and rhinorrhea.   Eyes: Negative for redness and visual disturbance.  Respiratory: Positive for shortness of breath. Negative for wheezing.   Cardiovascular: Negative for chest pain and palpitations.  Gastrointestinal: Positive for  diarrhea. Negative for nausea and vomiting.  Genitourinary: Negative for dysuria and urgency.  Musculoskeletal: Positive for myalgias. Negative for arthralgias.  Skin: Negative for pallor and wound.  Neurological: Negative for dizziness and headaches.     Physical Exam Updated Vital Signs BP (!) 108/55   Pulse 75   Temp 97.7 F (36.5  C) (Oral)   Resp (!) 26   SpO2 96%   Physical Exam  Constitutional: She is oriented to person, place, and time. She appears well-developed and well-nourished. No distress.  Marked pallor  HENT:  Head: Normocephalic and atraumatic.  Eyes: Pupils are equal, round, and reactive to light. EOM are normal.  Neck: Normal range of motion. Neck supple.  Cardiovascular: Normal rate and regular rhythm. Exam reveals no gallop and no friction rub.  No murmur heard. Pulmonary/Chest: Effort normal. She has no wheezes. She has no rales.  Abdominal: Soft. She exhibits no distension and no mass. There is no tenderness. There is no guarding.  Musculoskeletal: She exhibits no edema or tenderness.  Neurological: She is alert and oriented to person, place, and time.  Skin: Skin is warm and dry. She is not diaphoretic.  Psychiatric: She has a normal mood and affect. Her behavior is normal.  Nursing note and vitals reviewed.    ED Treatments / Results  Labs (all labs ordered are listed, but only abnormal results are displayed) Labs Reviewed  CBC WITH DIFFERENTIAL/PLATELET - Abnormal; Notable for the following components:      Result Value   WBC 15.4 (*)    RBC 1.44 (*)    Hemoglobin 4.2 (*)    HCT 13.4 (*)    RDW 18.4 (*)    Neutro Abs 12.3 (*)    Monocytes Absolute 1.2 (*)    All other components within normal limits  COMPREHENSIVE METABOLIC PANEL - Abnormal; Notable for the following components:   CO2 21 (*)    Glucose, Bld 224 (*)    BUN 101 (*)    Creatinine, Ser 2.35 (*)    Calcium 8.3 (*)    Total Protein 5.8 (*)    Albumin 3.0 (*)    GFR calc non  Af Amer 19 (*)    GFR calc Af Amer 22 (*)    All other components within normal limits  I-STAT CHEM 8, ED - Abnormal; Notable for the following components:   BUN 95 (*)    Creatinine, Ser 2.40 (*)    Glucose, Bld 215 (*)    Calcium, Ion 0.98 (*)    TCO2 20 (*)    Hemoglobin 10.9 (*)    HCT 32.0 (*)    All other components within normal limits  POC OCCULT BLOOD, ED - Abnormal; Notable for the following components:   Fecal Occult Bld POSITIVE (*)    All other components within normal limits  LIPASE, BLOOD  PROTIME-INR  HEMOGLOBIN AND HEMATOCRIT, BLOOD  I-STAT TROPONIN, ED  TYPE AND SCREEN  PREPARE RBC (CROSSMATCH)    EKG EKG Interpretation  Date/Time:  Wednesday January 20 2018 14:23:35 EDT Ventricular Rate:  71 PR Interval:    QRS Duration: 167 QT Interval:  438 QTC Calculation: 476 R Axis:   69 Text Interpretation:  Normal sinus rhythm Left bundle branch block No significant change since last tracing Reconfirmed by Deno Etienne [phone]) on 01/20/2018 2:28:25 PM   Radiology Dg Chest 2 View  Result Date: 01/20/2018 CLINICAL DATA:  Weakness EXAM: CHEST - 2 VIEW COMPARISON:  02/18/2017 plain film examination, 10/08/2017 CT of the chest. FINDINGS: Cardiac shadow remains enlarged. Findings of prior TAVR are seen. The lungs are well aerated bilaterally without focal infiltrate or sizable effusion. Chronic blunting of the right costophrenic angle is noted laterally. The overall appearance is similar to that seen on the prior exam. IMPRESSION: No active cardiopulmonary disease. Electronically Signed  By: Inez Catalina M.D.   On: 01/20/2018 15:54    Procedures Procedures (including critical care time) Procedure note: Ultrasound Guided Peripheral IV Ultrasound guided peripheral 1.88 inch angiocath IV placement performed by me. Indications: Nursing unable to place IV. Details: The antecubital fossa and upper arm were evaluated with a multifrequency linear probe. Patent brachial veins were  noted. 1 attempt was made to cannulate a vein under realtime US guidance with successful cannulation of the vein and catheter placement. There is return of non-pulsatile dark red blood. The patient tolerated the procedure well without complications. Images archived electronically.  CPT codes: [phone] and [phone]  Medications Ordered in ED Medications  0.9 %  sodium chloride infusion (has no administration in time range)  pantoprazole (PROTONIX) 80 mg in sodium chloride 0.9 % 100 mL IVPB (has no administration in time range)  pantoprazole (PROTONIX) 80 mg in sodium chloride 0.9 % 250 mL (0.32 mg/mL) infusion (has no administration in time range)  sodium chloride 0.9 % bolus 1,000 mL (1,000 mLs Intravenous New Bag/Given 01/20/18 1516)  ondansetron (ZOFRAN) injection 4 mg (4 mg Intravenous Given 01/20/18 1515)  fentaNYL (SUBLIMAZE) injection 25 mcg (25 mcg Intravenous Given 01/20/18 1515)  ondansetron (ZOFRAN) injection 4 mg (4 mg Intravenous Given 01/20/18 1555)     Initial Impression / Assessment and Plan / ED Course  I have reviewed the triage vital signs and the nursing notes.  Pertinent labs & imaging results that were available during my care of the patient were reviewed by me and considered in my medical decision making (see chart for details).     82 yo F with a chief complaint of nausea vomiting and diarrhea.  The patient looks significantly pale on my exam.  Will check labs.  Give a bolus of IV fluids.  The patient is requesting pain medicine for all of her pain.  I am unsure of the etiology of this.  She has been having some thoracic back pain that has been ongoing.  Old records were reviewed and the patient has been worked up this month by pulmonology.  The patient was found to have guaiac positive stool.  Rectal exam with charcoal appearing stool no gross blood.  Hemoglobin is dropped 5 g from her baseline.  Discussed with Dr. Paulita Fujita, gastroenterology.  We will start the patient on a  Protonix drip.  Discussed with the hospitalist.  CRITICAL CARE Performed by: Cecilio Asper   Total critical care time: 80 minutes  Critical care time was exclusive of separately billable procedures and treating other patients.  Critical care was necessary to treat or prevent imminent or life-threatening deterioration.  Critical care was time spent personally by me on the following activities: development of treatment plan with patient and/or surrogate as well as nursing, discussions with consultants, evaluation of patient's response to treatment, examination of patient, obtaining history from patient or surrogate, ordering and performing treatments and interventions, ordering and review of laboratory studies, ordering and review of radiographic studies, pulse oximetry and re-evaluation of patient's condition.  The patients results and plan were reviewed and discussed.   Any x-rays performed were independently reviewed by myself.   Differential diagnosis were considered with the presenting HPI.  Medications  0.9 %  sodium chloride infusion (has no administration in time range)  pantoprazole (PROTONIX) 80 mg in sodium chloride 0.9 % 100 mL IVPB (has no administration in time range)  pantoprazole (PROTONIX) 80 mg in sodium chloride 0.9 % 250 mL (0.32 mg/mL) infusion (  has no administration in time range)  sodium chloride 0.9 % bolus 1,000 mL (1,000 mLs Intravenous New Bag/Given 01/20/18 1516)  ondansetron (ZOFRAN) injection 4 mg (4 mg Intravenous Given 01/20/18 1515)  fentaNYL (SUBLIMAZE) injection 25 mcg (25 mcg Intravenous Given 01/20/18 1515)  ondansetron (ZOFRAN) injection 4 mg (4 mg Intravenous Given 01/20/18 1555)    Vitals:   01/20/18 1500 01/20/18 1515 01/20/18 1541 01/20/18 1545  BP:    (!) 108/55  Pulse: 71 71 76 75  Resp: 19 20 (!) 22 (!) 26  Temp:      TempSrc:      SpO2: 100% 97% 95% 96%    Final diagnoses:  Acute upper GI bleed  Symptomatic anemia     Admission/ observation were discussed with the admitting physician, patient and/or family and they are comfortable with the plan.   Final Clinical Impressions(s) / ED Diagnoses   Final diagnoses:  Acute upper GI bleed  Symptomatic anemia    ED Discharge Orders    None       Deno Etienne, DO 01/20/18 1612

## 2018-01-20 NOTE — ED Notes (Signed)
Pt signed paper blood consent and placed at bedside.  Pt attempted to sign the online consent, signature pad was not working.

## 2018-01-20 NOTE — Significant Event (Signed)
Rapid Response Event Note  Overview: Triage/Level of Care.  Initial Focused Assessment: I received a call from 15M RN who was concerned about the level of care for this patient.  Patient was admitted today for back/chest/abdominal pain. She stated that her pain has be ongoing but now she N/V/D.  Per patient, she has not noticed bleeding from her rectum and is not vomiting blood, but + fecal occult.  Her last HB/HCT are 4.4/14.2 (4.2/13.4).  Patient is symptomatic, endorses fatigue and when she moves she feels dizzy. Skin cool to touch, pale, mucous membranes are pale as well. VS: SBPs 100-130s, MAPs> 78, HR 70s ( + 1 pulses present but thready), RR normal, lung sounds clear, + abdominal pain.   Interventions: -- NONE--  Plan of Care: -- Nursing clinical recommendation: SDU level of care.  -- Given that the patient is symptomatic, has not yet received any blood products, has orders to received 4 units PRBCs, has a history of GIB, is a on Eliquis (did receive KCENTRA and ANDEXXA) along with Protonix IV.  -- Patient also has CHF (last EF 12/18 60-65%), since she is getting multiple blood transfusions and MIVF, her respiratory/pulmonary status should be monitored closely as well along with her circulatory status. -- I asked the ED RNs to safely transfuse 2 units of PRBCs and re-evaluate the patient before sending the patient up to telemetry.  Event Summary:   at    Call Time [place]  Patricia [name] R

## 2018-01-20 NOTE — ED Triage Notes (Signed)
Per EMS: pt from home alone A/O with c/o N/V/D and hypotension.  Pt has a hx of A-fib. Pt denies CP and SOB.  PTA vitals: BP 88/50, HR 68, Sp02 96% RA.

## 2018-01-20 NOTE — Progress Notes (Signed)
Peripherally Inserted Central Catheter/Midline Placement  The IV Nurse has discussed with the patient and/or persons authorized to consent for the patient, the purpose of this procedure and the potential benefits and risks involved with this procedure.  The benefits include less needle sticks, lab draws from the catheter, and the patient may be discharged home with the catheter. Risks include, but not limited to, infection, bleeding, blood clot (thrombus formation), and puncture of an artery; nerve damage and irregular heartbeat and possibility to perform a PICC exchange if needed/ordered by physician.  Alternatives to this procedure were also discussed.  Bard Power PICC patient education guide, fact sheet on infection prevention and patient information card has been provided to patient /or left at bedside.    PICC/Midline Placement Documentation  PICC Double Lumen 76/22/63 PICC Right Basilic 44 cm 0 cm (Active)  Indication for Insertion or Continuance of Line Limited venous access - need for IV therapy >5 days (PICC only) 01/20/2018  6:42 PM  Exposed Catheter (cm) 0 cm 01/20/2018  6:42 PM  Site Assessment Clean;Dry;Intact 01/20/2018  6:42 PM  Lumen #1 Status Flushed;Saline locked;Blood return noted 01/20/2018  6:42 PM  Lumen #2 Status Flushed;Saline locked;Blood return noted 01/20/2018  6:42 PM  Dressing Type Transparent;Securing device 01/20/2018  6:42 PM  Dressing Status Clean;Dry;Intact;Antimicrobial disc in place 01/20/2018  6:42 PM  Dressing Change Due 01/27/18 01/20/2018  6:42 PM  PICC placed by Colin Ina RN     Frances Maywood 01/20/2018, 6:47 PM

## 2018-01-20 NOTE — ED Notes (Signed)
IV team at the bedside.

## 2018-01-20 NOTE — H&P (Signed)
History and Physical    Patricia [name] JJO:[id number] DOB: 02/12/1936 DOA: 01/20/2018  PCP: [name], NP Consultants:  Martinique - cardiology; Therisa Doyne - GI Patient coming from:  Home - lives alone; [facility]: daughter, [phone]  Chief Complaint:  Back/chest/abdominal pain  HPI: Patricia [name] is a 82 y.o. female with medical history significant of hypothyroidism; HTN;  DM; s/p TAVR; HLD; CHF; and afib on Xarelto presenting with back/chest/abdominal pain.  Patient has been having a lot of pain, dizzy, difficulty walking, diarrhea.  Symptoms started several weeks ago with pain between her shoulder blades, chest, occasionally in abdomen.  She started feeling weak and dizzy about the same time.  Diarrhea started last night, also with n/v.  She takes iron and so has not noticed a change in her stools.  She sees Dr. Therisa Doyne for h/o GI bleeds - EGD 5/18 with gastric polyps and angiodysplasia with bleeding the duodenum.  She did receive blood at that time, but her symptoms are not the same now.   ED Course:  Likely upper GI bleed - N/V/D, hypotension in the field (not in ER).  Last BP 130s.  Hgb 4.2.  On Eliquis for afib.  She is getting Interior and spatial designer.  Protonix drip, [facility] (Dr. Paulita Fujita) to see.  Review of Systems: As per HPI; otherwise review of systems reviewed and negative.   Ambulatory Status:  Ambulates with a cane at times due to dizziness x 2-3 days  Past Medical History:  Diagnosis Date  . Allergy   . Anemia, unspecified   . Arthritis   . Asthma   . Atrial fibrillation status post cardioversion [facility]) 09/2015   s/p TEE/DCCV>>SR on amio  . Benign neoplasm of colon   . Carpal tunnel syndrome   . Cellulitis and abscess of finger, unspecified   . Cerumen impaction   . Cervicalgia   . CHF (congestive heart failure) ([place])   . Chronic airway obstruction, not elsewhere classified   . Chronic anticoagulation 09/2015   Eliquis  . Diarrhea   . Diffuse cystic mastopathy   . Edema   .  Encounter for long-term (current) use of other medications   . GERD (gastroesophageal reflux disease)   . Heart murmur   . Lumbago   . Neck pain 05/23/2015  . Obesity, unspecified   . Other and unspecified hyperlipidemia   . Other psoriasis   . Pain in joint, lower leg   . PONV (postoperative nausea and vomiting)   . Primary localized osteoarthrosis of right shoulder 12/18/2015  . 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 Scoliosis (and kyphoscoliosis), idiopathic   . Shortness of breath dyspnea    with exertion  . Type II or unspecified type diabetes mellitus without mention of complication, uncontrolled   . Unspecified essential hypertension   . Unspecified hemorrhoids without mention of complication   . Unspecified hypothyroidism   . Urge incontinence     Past Surgical History:  Procedure Laterality Date  . ABDOMINAL HYSTERECTOMY  1987   complete  . BREAST BIOPSY Right   . BREAST EXCISIONAL BIOPSY Left 2011  . BREAST EXCISIONAL BIOPSY Right    x2  . BREAST SURGERY     right breast 3 surgeries [id number]  . CARDIAC CATHETERIZATION N/A 09/02/2016   Procedure: Right/Left Heart Cath and Coronary Angiography;  Surgeon: [name] M Martinique, MD;  Location: [place] [facility];  Service: Cardiovascular;  Laterality: N/A;  . CARDIOVERSION N/A 10/19/2015   Procedure: CARDIOVERSION;  Surgeon: Lelon Perla, MD;  Location: [facility] ENDOSCOPY;  Service: Cardiovascular;  Laterality: N/A;  . CARDIOVERSION N/A 11/13/2017   Procedure: CARDIOVERSION;  Surgeon: Jerline Pain, MD;  Location: [place];  Service: Cardiovascular;  Laterality: N/A;  . [place]  . COLON SURGERY     2004,2007,2013.  3 times colon surgieres  . ESOPHAGOGASTRODUODENOSCOPY (EGD) WITH PROPOFOL Left 03/13/2017   Procedure: ESOPHAGOGASTRODUODENOSCOPY (EGD) WITH PROPOFOL;   Surgeon: Ronnette Juniper, MD;  Location: [place];  Service: Gastroenterology;  Laterality: Left;  . EXCISE LE MANDIBULAR LYMPH NODE T     DR C. NEWMAN  . FOOT SURGERY  1989  . LEFT SHOULDER ARTHROSCOPY    . 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 NEUROPLASTY / TRANSPOSITION MEDIAN NERVE AT CARPAL TUNNEL BILATERAL  05/2003  . TEE WITHOUT CARDIOVERSION N/A 10/19/2015   Procedure: Transesophageal Echocardiogram (TEE) ;  Surgeon: Lelon Perla, MD;  Location: [facility] ENDOSCOPY;  Service: Cardiovascular;  Laterality: N/A;  . TEE WITHOUT CARDIOVERSION N/A 10/14/2016   Procedure: TRANSESOPHAGEAL ECHOCARDIOGRAM (TEE);  Surgeon: Sherren Mocha, MD;  Location: [place];  Service: Open Heart Surgery;  Laterality: N/A;  . TOTAL SHOULDER ARTHROPLASTY Right 12/18/2015   Procedure: TOTAL SHOULDER ARTHROPLASTY;  Surgeon: [name], MD;  Location: [place];  Service: Orthopedics;  Laterality: Right;  . TRANSCATHETER AORTIC VALVE REPLACEMENT, TRANSFEMORAL N/A 10/14/2016   Procedure: TRANSCATHETER AORTIC VALVE REPLACEMENT, TRANSFEMORAL;  Surgeon: Sherren Mocha, MD;  Location: [place];  Service: Open Heart Surgery;  Laterality: N/A;  . TRIGGER FINGER RELEASE Left   . VAGINAL CYST REMOVED  1967    Social History   Socioeconomic History  . Marital status: Divorced    Spouse name: Not on file  . Number of children: 1  . Years of education: Not on file  . Highest education level: Not on file  Occupational History  . Occupation: Retired Market researcher for [employer]  . Financial resource strain: Not hard at all  . Food insecurity:    Worry: Never true    Inability: Never true  . Transportation needs:    Medical: No    Non-medical: No  Tobacco Use  . Smoking status: Former Smoker    Packs/day: 1.00    Years: 40.00    Pack years: 40.00    Last attempt to quit: 09/07/1989    Years since quitting: 28.3  . Smokeless tobacco: Never Used  Substance and Sexual Activity  . Alcohol use: No     Alcohol/week: 0.0 oz  . Drug use: No  . Sexual activity: Never  Lifestyle  . Physical activity:    Days per week: 0 days    Minutes per session: 0 min  . Stress: To some extent  Relationships  . Social connections:    Talks on phone: More than three times a week    Gets together: Once a week    Attends religious service: Never    Active member of club or organization: No    Attends meetings of clubs or organizations: Never    Relationship status: Widowed  . Intimate partner violence:    Fear of current or ex partner: No    Emotionally abused: No    Physically abused: No    Forced sexual activity: No  Other Topics Concern  . Not on file  Social History Narrative   Her  daughter & 3 grandchildren live in the area.      Allergies  Allergen Reactions  . Codeine Other (See Comments)    "Spaces out" the patient and she cannot walk well  . Vesicare [Solifenacin] Itching    Family History  Problem Relation Age of Onset  . Diabetes Father   . Stroke Father   . Heart disease Father   . Liver cancer Sister   . Heart disease Brother   . Asthma Sister   . Arthritis Sister     Prior to Admission medications   Medication Sig Start Date End Date Taking? Authorizing Provider  acetaminophen (TYLENOL) 500 MG tablet Take 500 mg by mouth every 6 (six) hours as needed for mild pain.   Yes [provider]  albuterol (PROVENTIL) (2.5 MG/3ML) 0.083% nebulizer solution USE 1 VIAL IN NEBULIZER EVERY 4 HOURS AND AS NEEDED. Generic: VENTOLIN Patient taking differently: use 1 vial in nebulizer every 4 hours and as needed for wheezing 11/16/17  Yes Brand Males, MD  albuterol (VENTOLIN HFA) 108 (90 Base) MCG/ACT inhaler Inhale 2 puffs into the lungs every 6 (six) hours as needed for wheezing or shortness of breath. 11/19/17  Yes Brand Males, MD  amiodarone (PACERONE) 200 MG tablet Take 200 mg by mouth daily.   Yes [provider]  apixaban (ELIQUIS) 5 MG TABS tablet  Take 1 tablet (5 mg total) by mouth 2 (two) times daily. 11/04/17  Yes Martinique, [name] M, MD  cetirizine (ZYRTEC) 10 MG tablet Take 10 mg by mouth daily as needed for allergies.    Yes [provider]  cholecalciferol (VITAMIN D) 1000 units tablet Take 1,000 Units by mouth at bedtime.    Yes [provider]  doxazosin (CARDURA) 4 MG tablet Take 1 tablet (4 mg total) by mouth daily. 04/07/17  Yes [name], NP  ferrous sulfate 325 (65 FE) MG EC tablet Take 325 mg by mouth 2 (two) times daily.   Yes [provider]  fluticasone furoate-vilanterol (BREO ELLIPTA) 100-25 MCG/INH AEPB Inhale into the lungs once daily Patient taking differently: Inhale 1 puff into the lungs daily.  11/19/17  Yes Brand Males, MD  furosemide (LASIX) 40 MG tablet Take 1 tablet (40 mg total) by mouth daily. 11/16/17  Yes [name], NP  glimepiride (AMARYL) 2 MG tablet Take one tablet by mouth once daily at breakfast to control glucose Patient taking differently: Take 2 mg by mouth daily.  08/17/17  Yes [name], NP  Ketotifen Fumarate (ALLERGY EYE DROPS OP) Place 1 drop into both eyes 3 (three) times daily as needed (allergies).    Yes [provider]  levothyroxine (SYNTHROID, LEVOTHROID) 88 MCG tablet Take 1 tablet (88 mcg total) by mouth daily. 08/11/17  Yes [name], NP  mirabegron ER (MYRBETRIQ) 25 MG TB24 tablet One daily to help bladder control Patient taking differently: Take 25 mg by mouth daily.  01/20/17  Yes Estill Dooms, MD  pantoprazole (PROTONIX) 40 MG tablet TAKE 1 TABLET BY MOUTH TWO  TIMES DAILY Patient taking differently: 71m by mouth twice daily 10/29/17  Yes ELauree Chandler NP  pravastatin (PRAVACHOL) 20 MG tablet Take 1 tablet (20 mg total) by mouth daily. 12/22/17  Yes ELauree Chandler NP  triamcinolone (KENALOG) 0.025 % cream Apply 1 application topically daily as needed (psoriasis). 04/06/17  Yes ELauree Chandler NP    potassium gluconate 595 (99 K) MG TABS tablet Take 595 mg  by mouth See admin instructions. TAKES ONLY WHEN USING FUROSEMIDE    [provider]    Physical Exam: Vitals:   01/20/18 1630 01/20/18 1705 01/20/18 1745 01/20/18 1800  BP: 113/64  (!) 117/48 (!) 131/43  Pulse: 76 71 80 76  Resp: (!) 22 19 (!) 23 (!) 23  Temp:      TempSrc:      SpO2: 92% 100% 100% 100%  Weight:         General: Appears calm and comfortable and is NAD; very pale Eyes:  PERRL, EOMI, normal lids, iris; marked conjunctival pallor ENT:  grossly normal hearing, lips & tongue, mmm; poor dentition Neck:  no LAD, masses or thyromegaly; no carotid bruits Cardiovascular:  RRR, no m/r/g. 1-2+ LE edema.  Respiratory:   CTA bilaterally with no wheezes/rales/rhonchi.  Normal respiratory effort. Abdomen:  soft, mildly tender primarily in the lower quadrants, ND, NABS Skin:  no rash or induration seen on limited exam Musculoskeletal:  grossly normal tone BUE/BLE, good ROM, no bony abnormality Psychiatric:  flat mood and affect, speech fluent and appropriate, AOx3 Neurologic:  CN 2-12 grossly intact, moves all extremities in coordinated fashion, sensation intact    Radiological Exams on Admission: Dg Chest 2 View  Result Date: 01/20/2018 CLINICAL DATA:  Weakness EXAM: CHEST - 2 VIEW COMPARISON:  02/18/2017 plain film examination, 10/08/2017 CT of the chest. FINDINGS: Cardiac shadow remains enlarged. Findings of prior TAVR are seen. The lungs are well aerated bilaterally without focal infiltrate or sizable effusion. Chronic blunting of the right costophrenic angle is noted laterally. The overall appearance is similar to that seen on the prior exam. IMPRESSION: No active cardiopulmonary disease. Electronically Signed   By: Inez Catalina M.D.   On: 01/20/2018 15:54   [place] Ekg Site Rite  Result Date: 01/20/2018 If Site Rite image not attached, placement could not be confirmed due to current cardiac rhythm.   EKG:  Independently reviewed.  NSR with rate 71; LBBB with NSCSLT  Labs on Admission: I have personally reviewed the available labs and imaging studies at the time of the admission.  Pertinent labs:   CO2 21 Glucose 224 BUN 101/Creatinine 2.35/GFR 19; prior 35/1.67/29 Troponin 0.03 WBC 15.4 Hgb 4.2, prior 9.0 on 2/28   Assessment/Plan Principal Problem:   GI bleed Active Problems:   DM type 2 (diabetes mellitus, type 2) (HCC)   Hypothyroidism   PAF (paroxysmal atrial fibrillation) (HCC)   Acute blood loss anemia   Acute renal failure superimposed on stage 3 chronic kidney disease (HCC)   Upper GI bleed -Patient's lightheadedness and fatigue are most likely caused by anemia secondary to upper GI bleeding.  -BUN with marked elevation, further indicating likely upper bleeding -Patient has history of angiodysplasia in the duodenum and gastric polyps -Another potential differential diagnoses is gastric or duodenal ulcer - will admit to tele bed - GI consulted by ED, will follow up recommendations - NPO for possible EGD - NS at 125 mL/hr when not running blood products - Start IV pantoprazole drip - Zofran IV for nausea - Avoid NSAIDs and SQ heparin - Maintain IV access (2 large bore IVs if possible).  If unable to get access, will request PICC/midline placement. - Monitor closely and follow cbc, transfuse as necessary.  Symptomatic anemia from acute blood loss -Transfuse 4 units PRBC to start and recheck Hgb afterwards.    Acute renal failure on CKD -Likely associated with volume loss (blood but also N/V/D)  -Replete with IVF  and blood products and recheck BMP in AM  DM -Recent A1c 7.3 -hold Amaryl -Cover with moderate-scale SSI  Hypothyroidism -Check TSH and free T4 based on recent (2/28) abnormal TSH -Continue Synthroid at current dose for now  Afib on Eliquis -Rate controlled with Amiodarone, continued -Eliquis held -She was given KCentra in the ER, although the  utility is questionable and she has not had a dose in 24 hours  DVT prophylaxis:  SCDs Code Status:  Full - confirmed with patient/family Family Communication: Son present throughout evaluation Disposition Plan:  Home once clinically improved Consults called: GI  Admission status: Admit - It is my clinical opinion that admission to [place] is reasonable and necessary because of the expectation that this patient will require hospital care that crosses at least 2 midnights to treat this condition based on the medical complexity of the problems presented.  Given the aforementioned information, the predictability of an adverse outcome is felt to be significant.     Karmen Bongo MD [facility]  If note is complete, please contact covering daytime or nighttime physician. [email or web address] Password [facility]  01/20/2018, 6:42 PM

## 2018-01-21 LAB — CBG MONITORING, ED
Glucose-Capillary: 165 mg/dL — ABNORMAL HIGH (ref 65–99)
Glucose-Capillary: 165 mg/dL — ABNORMAL HIGH (ref 65–99)

## 2018-01-21 LAB — CBC
HCT: 20.9 % — ABNORMAL LOW (ref 36.0–46.0)
Hemoglobin: 6.8 g/dL — CL (ref 12.0–15.0)
MCH: 29.4 pg (ref 26.0–34.0)
MCHC: 32.5 g/dL (ref 30.0–36.0)
MCV: 90.5 fL (ref 78.0–100.0)
Platelets: 172 10*3/uL (ref 150–400)
RBC: 2.31 MIL/uL — ABNORMAL LOW (ref 3.87–5.11)
RDW: 15.7 % — ABNORMAL HIGH (ref 11.5–15.5)
WBC: 16 10*3/uL — ABNORMAL HIGH (ref 4.0–10.5)

## 2018-01-21 LAB — BASIC METABOLIC PANEL
Anion gap: 12 (ref 5–15)
BUN: 101 mg/dL — ABNORMAL HIGH (ref 6–20)
CO2: 23 mmol/L (ref 22–32)
Calcium: 7.6 mg/dL — ABNORMAL LOW (ref 8.9–10.3)
Chloride: 104 mmol/L (ref 101–111)
Creatinine, Ser: 2.31 mg/dL — ABNORMAL HIGH (ref 0.44–1.00)
GFR calc Af Amer: 22 mL/min — ABNORMAL LOW (ref >60)
GFR calc non Af Amer: 19 mL/min — ABNORMAL LOW (ref >60)
Glucose, Bld: 170 mg/dL — ABNORMAL HIGH (ref 65–99)
Potassium: 3.9 mmol/L (ref 3.5–5.1)
Sodium: 139 mmol/L (ref 135–145)

## 2018-01-21 LAB — GLUCOSE, CAPILLARY: Glucose-Capillary: 163 mg/dL — ABNORMAL HIGH (ref 65–99)

## 2018-01-21 LAB — TSH: TSH: 5.553 u[IU]/mL — ABNORMAL HIGH (ref 0.350–4.500)

## 2018-01-21 LAB — T4, FREE: Free T4: 0.89 ng/dL (ref 0.61–1.12)

## 2018-01-21 LAB — PREPARE RBC (CROSSMATCH)

## 2018-01-21 MED ORDER — FUROSEMIDE 10 MG/ML IJ SOLN
20.0000 mg | Freq: Once | INTRAMUSCULAR | Status: AC
  Administered 2018-01-21: 20 mg via INTRAVENOUS
  Filled 2018-01-21: qty 2

## 2018-01-21 MED ORDER — SODIUM CHLORIDE 0.9 % IV SOLN
Freq: Once | INTRAVENOUS | Status: AC
  Administered 2018-01-21: 16:00:00 via INTRAVENOUS

## 2018-01-21 NOTE — H&P (View-Only) (Signed)
[facility] Consultation Note  Referring Provider: Domenic Polite [facility]) Primary Care Physician:  [name], NP  Reason for Consultation:  Melena, anemia  HPI: Patricia [name] is a 82 y.o. female on apixaban for atrial fibrillation.  Presents fatigue, weakness, lethargy.  Had Hgb 4, dark stools but on iron, hemoccult positive.  On pantoprazole prior to discharge.  Several weeks of vague ill-defined abdominal pain, hematemesis, hematochezia.  Has had some chest and back pain.  Unrevealing egd and colonoscopy in [place] in 2017 for melena and anemia; endoscopy May 2018 here by Dr. 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   Past Medical History:  Diagnosis Date  . Allergy   . Anemia, unspecified   . Arthritis   . Asthma   . Atrial fibrillation status post cardioversion [facility]) 09/2015   s/p TEE/DCCV>>SR on amio  . Benign neoplasm of colon   . Carpal tunnel syndrome   . Cellulitis and abscess of finger, unspecified   . Cerumen impaction   . Cervicalgia   . CHF (congestive heart failure) ([place])   . Chronic airway obstruction, not elsewhere classified   . Chronic anticoagulation 09/2015   Eliquis  . Diarrhea   . Diffuse cystic mastopathy   . Edema   . Encounter for long-term (current) use of other medications   . GERD (gastroesophageal reflux disease)   . Heart murmur   . Lumbago   . Neck pain 05/23/2015  . Obesity, unspecified   . Other and unspecified hyperlipidemia   . Other psoriasis   . Pain in joint, lower leg   . PONV (postoperative nausea and vomiting)   . Primary localized osteoarthrosis of right shoulder 12/18/2015  . 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 Scoliosis (and kyphoscoliosis), idiopathic   . Shortness of breath dyspnea    with exertion  . Type II or unspecified type diabetes mellitus without mention of  complication, uncontrolled   . Unspecified essential hypertension   . Unspecified hemorrhoids without mention of complication   . Unspecified hypothyroidism   . Urge incontinence     Past Surgical History:  Procedure Laterality Date  . ABDOMINAL HYSTERECTOMY  1987   complete  . BREAST BIOPSY Right   . BREAST EXCISIONAL BIOPSY Left 2011  . BREAST EXCISIONAL BIOPSY Right    x2  . BREAST SURGERY     right breast 3 surgeries [phone]  . CARDIAC CATHETERIZATION N/A 09/02/2016   Procedure: Right/Left Heart Cath and Coronary Angiography;  Surgeon: [name] M Martinique, MD;  Location: [facility];  Service: Cardiovascular;  Laterality: N/A;  . CARDIOVERSION N/A 10/19/2015   Procedure: CARDIOVERSION;  Surgeon: Lelon Perla, MD;  Location: [facility] ENDOSCOPY;  Service: Cardiovascular;  Laterality: N/A;  . CARDIOVERSION N/A 11/13/2017   Procedure: CARDIOVERSION;  Surgeon: Jerline Pain, MD;  Location: [place];  Service: Cardiovascular;  Laterality: N/A;  . [place]  . COLON SURGERY     2004,2007,2013.  3 times colon surgieres  . ESOPHAGOGASTRODUODENOSCOPY (EGD) WITH PROPOFOL Left 03/13/2017   Procedure: ESOPHAGOGASTRODUODENOSCOPY (EGD) WITH PROPOFOL;  Surgeon: Ronnette Juniper, MD;  Location: [place];  Service: Gastroenterology;  Laterality: Left;  . EXCISE LE MANDIBULAR LYMPH NODE T     DR C. NEWMAN  . FOOT SURGERY  1989  . LEFT SHOULDER ARTHROSCOPY    . 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 NEUROPLASTY / TRANSPOSITION MEDIAN NERVE AT CARPAL TUNNEL BILATERAL  05/2003  . TEE WITHOUT CARDIOVERSION N/A 10/19/2015   Procedure: Transesophageal Echocardiogram (TEE) ;  Surgeon: Lelon Perla, MD;  Location: [facility] ENDOSCOPY;  Service: Cardiovascular;  Laterality: N/A;  . TEE WITHOUT CARDIOVERSION N/A 10/14/2016   Procedure: TRANSESOPHAGEAL ECHOCARDIOGRAM (TEE);  Surgeon: Sherren Mocha, MD;  Location: [place];  Service: Open Heart Surgery;  Laterality: N/A;  . TOTAL  SHOULDER ARTHROPLASTY Right 12/18/2015   Procedure: TOTAL SHOULDER ARTHROPLASTY;  Surgeon: [name], MD;  Location: [place];  Service: Orthopedics;  Laterality: Right;  . TRANSCATHETER AORTIC VALVE REPLACEMENT, TRANSFEMORAL N/A 10/14/2016   Procedure: TRANSCATHETER AORTIC VALVE REPLACEMENT, TRANSFEMORAL;  Surgeon: Sherren Mocha, MD;  Location: [place];  Service: Open Heart Surgery;  Laterality: N/A;  . TRIGGER FINGER RELEASE Left   . VAGINAL CYST REMOVED  1967    Prior to Admission medications   Medication Sig Start Date End Date Taking? Authorizing Provider  acetaminophen (TYLENOL) 500 MG tablet Take 500 mg by mouth every 6 (six) hours as needed for mild pain.   Yes [provider]  albuterol (PROVENTIL) (2.5 MG/3ML) 0.083% nebulizer solution USE 1 VIAL IN NEBULIZER EVERY 4 HOURS AND AS NEEDED. Generic: VENTOLIN Patient taking differently: use 1 vial in nebulizer every 4 hours and as needed for wheezing 11/16/17  Yes Brand Males, MD  albuterol (VENTOLIN HFA) 108 (90 Base) MCG/ACT inhaler Inhale 2 puffs into the lungs every 6 (six) hours as needed for wheezing or shortness of breath. 11/19/17  Yes Brand Males, MD  amiodarone (PACERONE) 200 MG tablet Take 200 mg by mouth daily.   Yes [provider]  apixaban (ELIQUIS) 5 MG TABS tablet Take 1 tablet (5 mg total) by mouth 2 (two) times daily. 11/04/17  Yes Martinique, [name] M, MD  cetirizine (ZYRTEC) 10 MG tablet Take 10 mg by mouth daily as needed for allergies.    Yes [provider]  cholecalciferol (VITAMIN D) 1000 units tablet Take 1,000 Units by mouth at bedtime.    Yes [provider]  doxazosin (CARDURA) 4 MG tablet Take 1 tablet (4 mg total) by mouth daily. 04/07/17  Yes [name], NP  ferrous sulfate 325 (65 FE) MG EC tablet Take 325 mg by mouth 2 (two) times daily.   Yes [provider]  fluticasone furoate-vilanterol (BREO ELLIPTA) 100-25 MCG/INH AEPB Inhale into the lungs once  daily Patient taking differently: Inhale 1 puff into the lungs daily.  11/19/17  Yes Brand Males, MD  furosemide (LASIX) 40 MG tablet Take 1 tablet (40 mg total) by mouth daily. 11/16/17  Yes [name], NP  glimepiride (AMARYL) 2 MG tablet Take one tablet by mouth once daily at breakfast to control glucose Patient taking differently: Take 2 mg by mouth daily.  08/17/17  Yes [name], NP  Ketotifen Fumarate (ALLERGY EYE DROPS OP) Place 1 drop into both eyes 3 (three) times daily as needed (allergies).    Yes [provider]  levothyroxine (SYNTHROID, LEVOTHROID) 88 MCG tablet Take 1 tablet (88 mcg total) by mouth daily. 08/11/17  Yes [name], NP  mirabegron ER (MYRBETRIQ) 25 MG TB24 tablet One daily to help bladder control Patient taking differently: Take 25 mg by mouth daily.  01/20/17  Yes Estill Dooms, MD  pantoprazole (PROTONIX) 40 MG tablet TAKE 1 TABLET BY MOUTH TWO  TIMES DAILY Patient taking differently: 65m by mouth twice daily 10/29/17  Yes [name], NP  pravastatin (PRAVACHOL) 20 MG tablet Take 1 tablet (20 mg total) by mouth daily. 12/22/17  Yes [name], NP  triamcinolone (KENALOG) 0.025 % cream Apply 1 application topically daily as needed (psoriasis). 04/06/17  Yes [name], NP  potassium gluconate 595 (99 K) MG TABS tablet Take 595 mg by mouth See admin instructions. TAKES ONLY WHEN USING FUROSEMIDE    [provider]    Current Facility-Administered Medications  Medication Dose Route Frequency Provider Last Rate Last Dose  . 0.9 %  sodium chloride infusion   Intravenous Continuous Karmen Bongo, MD 125 mL/hr at 01/21/18 0931    . acetaminophen (TYLENOL) tablet 650 mg  650 mg Oral Q6H PRN Karmen Bongo, MD   650 mg at 01/20/18 2240   Or  . acetaminophen (TYLENOL) suppository 650 mg  650 mg Rectal Q6H PRN Karmen Bongo, MD      . albuterol (PROVENTIL) (2.5 MG/3ML) 0.083% nebulizer solution 2.5  mg  2.5 mg Nebulization Q2H PRN Karmen Bongo, MD      . amiodarone (PACERONE) tablet 200 mg  200 mg Oral Daily Karmen Bongo, MD      . doxazosin (CARDURA) tablet 4 mg  4 mg Oral Daily Karmen Bongo, MD      . ferrous sulfate tablet 325 mg  325 mg Oral BID Karmen Bongo, MD   325 mg at 01/20/18 2200  . fluticasone furoate-vilanterol (BREO ELLIPTA) 100-25 MCG/INH 1 puff  1 puff Inhalation Daily Karmen Bongo, MD      . insulin aspart (novoLOG) injection 0-15 Units  0-15 Units Subcutaneous TID WC Karmen Bongo, MD      . levothyroxine (SYNTHROID, LEVOTHROID) tablet 88 mcg  88 mcg Oral QAC breakfast Karmen Bongo, MD      . mirabegron ER [facility]) tablet 25 mg  25 mg Oral Daily Karmen Bongo, MD      . ondansetron [facility]) tablet 4 mg  4 mg Oral Q6H PRN Karmen Bongo, MD       Or  . ondansetron [facility]) injection 4 mg  4 mg Intravenous Q6H PRN Karmen Bongo, MD   4 mg at 01/20/18 2240  . pantoprazole (PROTONIX) 80 mg in sodium chloride 0.9 % 250 mL (0.32 mg/mL) infusion  8 mg/hr Intravenous Continuous Karmen Bongo, MD 25 mL/hr at 01/21/18 0931 8 mg/hr at 01/21/18 0931  . pravastatin (PRAVACHOL) tablet 20 mg  20 mg Oral Daily Karmen Bongo, MD      . sodium chloride flush (NS) 0.9 % injection 10-40 mL  10-40 mL Intracatheter PRN Karmen Bongo, MD       Current Outpatient Medications  Medication Sig Dispense Refill  . acetaminophen (TYLENOL) 500 MG tablet Take 500 mg by mouth every 6 (six) hours as needed for mild pain.    Marland Kitchen albuterol (PROVENTIL) (2.5 MG/3ML) 0.083% nebulizer solution USE 1 VIAL IN NEBULIZER EVERY 4 HOURS AND AS NEEDED. Generic: VENTOLIN (Patient taking differently: use 1 vial in nebulizer every 4 hours and as needed for wheezing) 60 vial 10  . albuterol (VENTOLIN HFA) 108 (90 Base) MCG/ACT inhaler Inhale 2 puffs into the lungs every 6 (six) hours as needed for wheezing or shortness of breath. 1 Inhaler 11  . amiodarone (PACERONE) 200 MG tablet Take 200  mg by mouth daily.    Marland Kitchen apixaban (ELIQUIS) 5 MG TABS tablet Take 1 tablet (5 mg total) by mouth 2 (two) times daily. 180 tablet 1  . cetirizine (ZYRTEC) 10  MG tablet Take 10 mg by mouth daily as needed for allergies.     . cholecalciferol (VITAMIN D) 1000 units tablet Take 1,000 Units by mouth at bedtime.     Marland Kitchen doxazosin (CARDURA) 4 MG tablet Take 1 tablet (4 mg total) by mouth daily. 30 tablet 0  . ferrous sulfate 325 (65 FE) MG EC tablet Take 325 mg by mouth 2 (two) times daily.    . fluticasone furoate-vilanterol (BREO ELLIPTA) 100-25 MCG/INH AEPB Inhale into the lungs once daily (Patient taking differently: Inhale 1 puff into the lungs daily. ) 1 each 5  . furosemide (LASIX) 40 MG tablet Take 1 tablet (40 mg total) by mouth daily. 90 tablet 0  . glimepiride (AMARYL) 2 MG tablet Take one tablet by mouth once daily at breakfast to control glucose (Patient taking differently: Take 2 mg by mouth daily. ) 90 tablet 1  . Ketotifen Fumarate (ALLERGY EYE DROPS OP) Place 1 drop into both eyes 3 (three) times daily as needed (allergies).     Marland Kitchen levothyroxine (SYNTHROID, LEVOTHROID) 88 MCG tablet Take 1 tablet (88 mcg total) by mouth daily. 90 tablet 3  . mirabegron ER (MYRBETRIQ) 25 MG TB24 tablet One daily to help bladder control (Patient taking differently: Take 25 mg by mouth daily. ) 90 tablet 5  . pantoprazole (PROTONIX) 40 MG tablet TAKE 1 TABLET BY MOUTH TWO  TIMES DAILY (Patient taking differently: 7m by mouth twice daily) 180 tablet 1  . pravastatin (PRAVACHOL) 20 MG tablet Take 1 tablet (20 mg total) by mouth daily. 90 tablet 1  . triamcinolone (KENALOG) 0.025 % cream Apply 1 application topically daily as needed (psoriasis). 30 g 0  . potassium gluconate 595 (99 K) MG TABS tablet Take 595 mg by mouth See admin instructions. TAKES ONLY WHEN USING FUROSEMIDE      Allergies as of 01/20/2018 - Review Complete 01/20/2018  Allergen Reaction Noted  . Codeine Other (See Comments) 10/07/2012  .  Vesicare [solifenacin] Itching 03/02/2013    Family History  Problem Relation Age of Onset  . Diabetes Father   . Stroke Father   . Heart disease Father   . Liver cancer Sister   . Heart disease Brother   . Asthma Sister   . Arthritis Sister     Social History   Socioeconomic History  . Marital status: Divorced    Spouse name: Not on file  . Number of children: 1  . Years of education: Not on file  . Highest education level: Not on file  Occupational History  . Occupation: Retired pMarket researcherfor [employer] . Financial resource strain: Not hard at all  . Food insecurity:    Worry: Never true    Inability: Never true  . Transportation needs:    Medical: No    Non-medical: No  Tobacco Use  . Smoking status: Former Smoker    Packs/day: 1.00    Years: 40.00    Pack years: 40.00    Last attempt to quit: 09/07/1989    Years since quitting: 28.3  . Smokeless tobacco: Never Used  Substance and Sexual Activity  . Alcohol use: No    Alcohol/week: 0.0 oz  . Drug use: No  . Sexual activity: Never  Lifestyle  . Physical activity:    Days per week: 0 days    Minutes per session: 0 min  . Stress: To some extent  Relationships  . Social connections:    Talks  on phone: More than three times a week    Gets together: Once a week    Attends religious service: Never    Active member of club or organization: No    Attends meetings of clubs or organizations: Never    Relationship status: Widowed  . Intimate partner violence:    Fear of current or ex partner: No    Emotionally abused: No    Physically abused: No    Forced sexual activity: No  Other Topics Concern  . Not on file  Social History Narrative   Her daughter & 3 grandchildren live in the area.      Review of Systems: As per HPI, all others negative  Physical Exam: Vital signs in last 24 hours: Temp:  [97.3 F (36.3 C)-98.7 F (37.1 C)] 98.6 F (37 C) (03/28 0554) Pulse Rate:   [69-82] 73 (03/28 1015) Resp:  [16-26] 23 (03/28 1015) BP: (97-144)/(31-86) 118/44 (03/28 1015) SpO2:  [92 %-100 %] 100 % (03/28 1015) Weight:  [225 lb (102.1 kg)] 225 lb (102.1 kg) (03/27 1600)   General:   Alert,  Well-developed, well-nourished, pleasant and cooperative in NAD Head:  Normocephalic and atraumatic. Eyes:  Sclera clear, no icterus.   Conjunctiva pink. Ears:  Normal auditory acuity. Nose:  No deformity, discharge,  or lesions. Mouth:  No deformity or lesions.  Oropharynx pink & moist. Neck:  Supple; no masses or thyromegaly. Lungs:  Clear throughout to auscultation.   No wheezes, crackles, or rhonchi. No acute distress. Heart:  Regular rate and rhythm; no murmurs, clicks, rubs,  or gallops. Abdomen:  Soft, nontender and nondistended. No masses, hepatosplenomegaly or hernias noted. Normal bowel sounds, without guarding, and without rebound.     Msk:  Symmetrical without gross deformities. Normal posture. Pulses:  Normal pulses noted. Extremities:  Without clubbing or edema. Neurologic:  Alert and  oriented x4;  grossly normal neurologically. Skin:  Intact without significant lesions or rashes. Cervical Nodes:  No significant cervical adenopathy. Psych:  Alert and cooperative. Normal mood and affect.   Lab Results: Recent Labs    01/20/18 1503 01/20/18 1513 01/20/18 1604 01/21/18 0543  WBC 15.4*  --   --  16.0*  HGB 4.2* 10.9* 4.4* 6.8*  HCT 13.4* 32.0* 14.2* 20.9*  PLT 246  --   --  172   BMET Recent Labs    01/20/18 1503 01/20/18 1513 01/21/18 0543  NA 136 135 139  K 4.1 4.1 3.9  CL 101 101 104  CO2 21*  --  23  GLUCOSE 224* 215* 170*  BUN 101* 95* 101*  CREATININE 2.35* 2.40* 2.31*  CALCIUM 8.3*  --  7.6*   LFT Recent Labs    01/20/18 1503  PROT 5.8*  ALBUMIN 3.0*  AST 21  ALT 16  ALKPHOS 40  BILITOT 0.5   PT/INR Recent Labs    01/20/18 1604  LABPROT 17.4*  INR 1.44    Studies/Results: Dg Chest 2 View  Result Date:  01/20/2018 CLINICAL DATA:  Weakness EXAM: CHEST - 2 VIEW COMPARISON:  02/18/2017 plain film examination, 10/08/2017 CT of the chest. FINDINGS: Cardiac shadow remains enlarged. Findings of prior TAVR are seen. The lungs are well aerated bilaterally without focal infiltrate or sizable effusion. Chronic blunting of the right costophrenic angle is noted laterally. The overall appearance is similar to that seen on the prior exam. IMPRESSION: No active cardiopulmonary disease. Electronically Signed   By: Inez Catalina M.D.   On: 01/20/2018 15:54  [place] Ekg Site Rite  Result Date: 01/20/2018 If Site Rite image not attached, placement could not be confirmed due to current cardiac rhythm.  Impression:  1.  Anemia with dark stools. 2.  Chronic anticoagulation. 3.  Multiple medical problems.  Plan:  1.  Continue transfusions. 2.  Serial CBC. 3.  Diet ok today, NPO after midnight. 4.  PPI. 5.  Hold anticoagulation. 6.  Endoscopy tomorrow. 7.  Risks (bleeding, infection, bowel perforation that could require surgery, sedation-related changes in cardiopulmonary systems), benefits (identification and possible treatment of source of symptoms, exclusion of certain causes of symptoms), and alternatives (watchful waiting, radiographic imaging studies, empiric medical treatment) of upper endoscopy (EGD) were explained to patient/family in detail and patient wishes to proceed. 8.  Next step in management pending endoscopy findings.   LOS: 1 day   Aanchal Cope M  01/21/2018, 10:32 AM  Cell [phone] If no answer or after 5 PM call [phone]

## 2018-01-21 NOTE — ED Notes (Signed)
Pt CBG 165, notified [employer] banker, corporate)

## 2018-01-21 NOTE — Progress Notes (Signed)
PROGRESS NOTE    Patricia [name]  LSL:[id number] DOB: October 02, 1936 DOA: 01/20/2018 PCP: [name], NP  Brief Narrative:Patricia [name] is a 82 y.o. female with medical history significant of hypothyroidism; HTN;  DM; s/p TAVR; HLD; CHF; and afib on Xarelto presentedwith back/chest/abdominal pain. She started feeling weak and dizzy about the same time, takes iron and so has not noticed a change in her stools.   In ED Hgb 4.2.  On Eliquis for afib.  She is getting Interior and spatial designer.  Protonix drip, [facility] (Dr. Paulita Fujita) consulted  Assessment & Plan:   Upper GI bleed -Significant elevation of BUN, prior history of duodenal angiodysplasia and gastric polyps -Xarelto held -Continue IV PPI -Status post 4 units of PRBC will give another unit now [facility] gastroenterology consulting, plan for EGD tomorrow -Stop IV fluids  Acute blood loss anemia -Secondary to above, status post 4 units of PRBC -Hemoglobin 6.8 this morning, she may be continuing to lose blood -We'll give another unit of PRBC today and monitor  Acute kidney injury on CK D3 -Baseline creatinine 1.6-1.7 -Creatinine 2.4 on admission -Hemodynamically mediated due to blood loss, monitor with transfusion -Stop IV fluids today  DM -Recent A1c 7.3 -hold Amaryl -moderate-scale SSI  Hypothyroidism -Continue Synthroid  Afib on Eliquis -Rate controlled with Amiodarone, continued -Eliquis held -She was given KCentra in the ER  DVT prophylaxis:  SCDs Code Status:  Full code Family Communication: no family at bedside Disposition Plan:  Home when Hb stable, bleeding evaluated  Consults: [facility]     Procedures:   Antimicrobials:    Subjective: -feeling better after getting blood -Anxious to eat and drink something  Objective: Vitals:   01/21/18 1030 01/21/18 1100 01/21/18 1115 01/21/18 1130  BP: (!) 113/45 (!) 126/36 119/63 (!) 119/46  Pulse: 75 74 70 69  Resp: (!) 24 (!) 24 (!) 23 19  Temp:      TempSrc:        SpO2: 100% 99% 100% 100%  Weight:        Intake/Output Summary (Last 24 hours) at 01/21/2018 1238 Last data filed at 01/21/2018 1157 Gross per 24 hour  Intake 4358 ml  Output -  Net 4358 ml   Filed Weights   01/20/18 1600  Weight: 102.1 kg (225 lb)    Examination:  General exam: elderly obese female, chronically ill-appearing, sitting up in bed, no distress Respiratory system: good air movement, clear bilaterally Cardiovascular system: S1 & S2 heard, RRR.  Gastrointestinal system: Abdomen is nondistended, soft and nontender.Normal bowel sounds heard. Central nervous system: Alert and oriented. No focal neurological deficits. Extremities: Trace edema Skin: No rashes Psychiatry: Judgement and insight appear normal. Mood & affect appropriate.     Data Reviewed:   CBC: Recent Labs  Lab 01/20/18 1503 01/20/18 1513 01/20/18 1604 01/21/18 0543  WBC 15.4*  --   --  16.0*  NEUTROABS 12.3*  --   --   --   HGB 4.2* 10.9* 4.4* 6.8*  HCT 13.4* 32.0* 14.2* 20.9*  MCV 93.1  --   --  90.5  PLT 246  --   --  262   Basic Metabolic Panel: Recent Labs  Lab 01/20/18 1503 01/20/18 1513 01/21/18 0543  NA 136 135 139  K 4.1 4.1 3.9  CL 101 101 104  CO2 21*  --  23  GLUCOSE 224* 215* 170*  BUN 101* 95* 101*  CREATININE 2.35* 2.40* 2.31*  CALCIUM 8.3*  --  7.6*  GFR: Estimated Creatinine Clearance: 22.5 mL/min (A) (by C-G formula based on SCr of 2.31 mg/dL (H)). Liver Function Tests: Recent Labs  Lab 01/20/18 1503  AST 21  ALT 16  ALKPHOS 40  BILITOT 0.5  PROT 5.8*  ALBUMIN 3.0*   Recent Labs  Lab 01/20/18 1503  LIPASE 29   No results for input(s): AMMONIA in the last 168 hours. Coagulation Profile: Recent Labs  Lab 01/20/18 1604  INR 1.44   Cardiac Enzymes: No results for input(s): CKTOTAL, CKMB, CKMBINDEX, TROPONINI in the last 168 hours. BNP (last 3 results) Recent Labs    10/14/17 1545  PROBNP 2,102*   HbA1C: No results for input(s): HGBA1C in  the last 72 hours. CBG: Recent Labs  Lab 01/21/18 0120 01/21/18 0920  GLUCAP 165* 165*   Lipid Profile: No results for input(s): CHOL, HDL, LDLCALC, TRIG, CHOLHDL, LDLDIRECT in the last 72 hours. Thyroid Function Tests: Recent Labs    01/21/18 0543  TSH 5.553*  FREET4 0.89   Anemia Panel: No results for input(s): VITAMINB12, FOLATE, FERRITIN, TIBC, IRON, RETICCTPCT in the last 72 hours. Urine analysis:    Component Value Date/Time   COLORURINE YELLOW 10/10/2016 1409   APPEARANCEUR HAZY (A) 10/10/2016 1409   LABSPEC 1.020 10/10/2016 1409   PHURINE 5.0 10/10/2016 1409   GLUCOSEU NEGATIVE 10/10/2016 1409   HGBUR NEGATIVE 10/10/2016 1409   BILIRUBINUR NEGATIVE 10/10/2016 1409   BILIRUBINUR negative 07/18/2015 1337   BILIRUBINUR small 10/07/2012 1544   KETONESUR NEGATIVE 10/10/2016 1409   PROTEINUR 30 (A) 10/10/2016 1409   UROBILINOGEN 0.2 07/18/2015 1337   NITRITE NEGATIVE 10/10/2016 1409   LEUKOCYTESUR NEGATIVE 10/10/2016 1409   Sepsis Labs: _0 (procalcitonin:4,lacticidven:4)  )No results found for this or any previous visit (from the past 240 hour(s)).       Radiology Studies: Dg Chest 2 View  Result Date: 01/20/2018 CLINICAL DATA:  Weakness EXAM: CHEST - 2 VIEW COMPARISON:  02/18/2017 plain film examination, 10/08/2017 CT of the chest. FINDINGS: Cardiac shadow remains enlarged. Findings of prior TAVR are seen. The lungs are well aerated bilaterally without focal infiltrate or sizable effusion. Chronic blunting of the right costophrenic angle is noted laterally. The overall appearance is similar to that seen on the prior exam. IMPRESSION: No active cardiopulmonary disease. Electronically Signed   By: Inez Catalina M.D.   On: 01/20/2018 15:54   [place] Ekg Site Rite  Result Date: 01/20/2018 If Site Rite image not attached, placement could not be confirmed due to current cardiac rhythm.       Scheduled Meds: . amiodarone  200 mg Oral Daily  . doxazosin  4 mg  Oral Daily  . ferrous sulfate  325 mg Oral BID  . fluticasone furoate-vilanterol  1 puff Inhalation Daily  . insulin aspart  0-15 Units Subcutaneous TID WC  . levothyroxine  88 mcg Oral QAC breakfast  . mirabegron ER  25 mg Oral Daily  . pravastatin  20 mg Oral Daily   Continuous Infusions: . sodium chloride    . pantoprozole (PROTONIX) infusion 8 mg/hr (01/21/18 0931)     LOS: 1 day    Time spent: 77mn    PDomenic Polite MD [facility] Page via [email or web address], password [email or web address] After 7PM please contact night-coverage  01/21/2018, 12:38 PM

## 2018-01-21 NOTE — ED Notes (Signed)
Report given to [facility].

## 2018-01-21 NOTE — ED Notes (Signed)
Gi team at bedside

## 2018-01-21 NOTE — Consult Note (Signed)
[facility] Consultation Note  Referring Provider: Preetha Joseph ([facility]) Primary Care Physician:  Eubanks, Jessica K, NP  Reason for Consultation:  Melena, anemia  HPI: Patricia [name] is a 81 y.o. female on apixaban for atrial fibrillation.  Presents fatigue, weakness, lethargy.  Had Hgb 4, dark stools but on iron, hemoccult positive.  On pantoprazole prior to discharge.  Several weeks of vague ill-defined abdominal pain, hematemesis, hematochezia.  Has had some chest and back pain.  Unrevealing egd and colonoscopy in [place] in 2017 for melena and anemia; endoscopy May 2018 here by Dr. 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   Past Medical History:  Diagnosis Date  . Allergy   . Anemia, unspecified   . Arthritis   . Asthma   . Atrial fibrillation status post cardioversion (HCC) 09/2015   s/p TEE/DCCV>>SR on amio  . Benign neoplasm of colon   . Carpal tunnel syndrome   . Cellulitis and abscess of finger, unspecified   . Cerumen impaction   . Cervicalgia   . CHF (congestive heart failure) (HCC)   . Chronic airway obstruction, not elsewhere classified   . Chronic anticoagulation 09/2015   Eliquis  . Diarrhea   . Diffuse cystic mastopathy   . Edema   . Encounter for long-term (current) use of other medications   . GERD (gastroesophageal reflux disease)   . Heart murmur   . Lumbago   . Neck pain 05/23/2015  . Obesity, unspecified   . Other and unspecified hyperlipidemia   . Other psoriasis   . Pain in joint, lower leg   . PONV (postoperative nausea and vomiting)   . Primary localized osteoarthrosis of right shoulder 12/18/2015  . 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 Scoliosis (and kyphoscoliosis), idiopathic   . Shortness of breath dyspnea    with exertion  . Type II or unspecified type diabetes mellitus without mention of  complication, uncontrolled   . Unspecified essential hypertension   . Unspecified hemorrhoids without mention of complication   . Unspecified hypothyroidism   . Urge incontinence     Past Surgical History:  Procedure Laterality Date  . ABDOMINAL HYSTERECTOMY  1987   complete  . BREAST BIOPSY Right   . BREAST EXCISIONAL BIOPSY Left 2011  . BREAST EXCISIONAL BIOPSY Right    x2  . BREAST SURGERY     right breast 3 surgeries 1967,1986,2000  . CARDIAC CATHETERIZATION N/A 09/02/2016   Procedure: Right/Left Heart Cath and Coronary Angiography;  Surgeon: [name] M Jordan, MD;  Location: [facility];  Service: Cardiovascular;  Laterality: N/A;  . CARDIOVERSION N/A 10/19/2015   Procedure: CARDIOVERSION;  Surgeon: Brian S Crenshaw, MD;  Location: [facility]OS[facility];  Service: Cardiovascular;  Laterality: N/A;  . CARDIOVERSION N/A 11/13/2017   Procedure: CARDIOVERSION;  Surgeon: Skains, Mark C, MD;  Location: [facility]OS[facility];  Service: Cardiovascular;  Laterality: N/A;  . CHOLECYSTECTOMY  1992   DR BOWMAN  . COLON SURGERY     2004,2007,2013.  3 times colon surgieres  . ESOPHAGOGASTRODUODENOSCOPY (EGD) WITH PROPOFOL Left 03/13/2017   Procedure: ESOPHAGOGASTRODUODENOSCOPY (EGD) WITH PROPOFOL;  Surgeon: Karki, Arya, MD;  Location: [facility];  Service: Gastroenterology;  Laterality: Left;  . EXCISE LE MANDIBULAR LYMPH NODE T     DR C. NEWMAN  . FOOT SURGERY  1989  . LEFT SHOULDER ARTHROSCOPY    . 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 NEUROPLASTY / TRANSPOSITION MEDIAN NERVE AT CARPAL TUNNEL BILATERAL  05/2003  . TEE WITHOUT CARDIOVERSION N/A 10/19/2015   Procedure: Transesophageal Echocardiogram (TEE) ;  Surgeon: Brian S Crenshaw, MD;  Location: [facility];  Service: Cardiovascular;  Laterality: N/A;  . TEE WITHOUT CARDIOVERSION N/A 10/14/2016   Procedure: TRANSESOPHAGEAL ECHOCARDIOGRAM (TEE);  Surgeon: Michael Cooper, MD;  Location: [facility] OR;  Service: Open Heart Surgery;  Laterality: N/A;  . TOTAL  SHOULDER ARTHROPLASTY Right 12/18/2015   Procedure: TOTAL SHOULDER ARTHROPLASTY;  Surgeon: Joshua Landau, MD;  Location: [facility] OR;  Service: Orthopedics;  Laterality: Right;  . TRANSCATHETER AORTIC VALVE REPLACEMENT, TRANSFEMORAL N/A 10/14/2016   Procedure: TRANSCATHETER AORTIC VALVE REPLACEMENT, TRANSFEMORAL;  Surgeon: Michael Cooper, MD;  Location: [facility];  Service: Open Heart Surgery;  Laterality: N/A;  . TRIGGER FINGER RELEASE Left   . VAGINAL CYST REMOVED  1967    Prior to Admission medications   Medication Sig Start Date End Date Taking? Authorizing Provider  acetaminophen (TYLENOL) 500 MG tablet Take 500 mg by mouth every 6 (six) hours as needed for mild pain.   Yes [provider]  albuterol (PROVENTIL) (2.5 MG/3ML) 0.083% nebulizer solution USE 1 VIAL IN NEBULIZER EVERY 4 HOURS AND AS NEEDED. Generic: VENTOLIN Patient taking differently: use 1 vial in nebulizer every 4 hours and as needed for wheezing 11/16/17  Yes Ramaswamy, Murali, MD  albuterol (VENTOLIN HFA) 108 (90 Base) MCG/ACT inhaler Inhale 2 puffs into the lungs every 6 (six) hours as needed for wheezing or shortness of breath. 11/19/17  Yes Ramaswamy, Murali, MD  amiodarone (PACERONE) 200 MG tablet Take 200 mg by mouth daily.   Yes [provider]  apixaban (ELIQUIS) 5 MG TABS tablet Take 1 tablet (5 mg total) by mouth 2 (two) times daily. 11/04/17  Yes Jordan, [name] M, MD  cetirizine (ZYRTEC) 10 MG tablet Take 10 mg by mouth daily as needed for allergies.    Yes [provider]  cholecalciferol (VITAMIN D) 1000 units tablet Take 1,000 Units by mouth at bedtime.    Yes [provider]  doxazosin (CARDURA) 4 MG tablet Take 1 tablet (4 mg total) by mouth daily. 04/07/17  Yes Eubanks, Jessica K, NP  ferrous sulfate 325 (65 FE) MG EC tablet Take 325 mg by mouth 2 (two) times daily.   Yes [provider]  fluticasone furoate-vilanterol (BREO ELLIPTA) 100-25 MCG/INH AEPB Inhale into the lungs once  daily Patient taking differently: Inhale 1 puff into the lungs daily.  11/19/17  Yes Ramaswamy, Murali, MD  furosemide (LASIX) 40 MG tablet Take 1 tablet (40 mg total) by mouth daily. 11/16/17  Yes Eubanks, Jessica K, NP  glimepiride (AMARYL) 2 MG tablet Take one tablet by mouth once daily at breakfast to control glucose Patient taking differently: Take 2 mg by mouth daily.  08/17/17  Yes Eubanks, Jessica K, NP  Ketotifen Fumarate (ALLERGY EYE DROPS OP) Place 1 drop into both eyes 3 (three) times daily as needed (allergies).    Yes [provider]  levothyroxine (SYNTHROID, LEVOTHROID) 88 MCG tablet Take 1 tablet (88 mcg total) by mouth daily. 08/11/17  Yes Eubanks, Jessica K, NP  mirabegron ER (MYRBETRIQ) 25 MG TB24 tablet One daily to help bladder control Patient taking differently: Take 25 mg by mouth daily.  01/20/17  Yes Green, Arthur G, MD  pantoprazole (PROTONIX) 40 MG tablet TAKE 1 TABLET BY MOUTH TWO  TIMES DAILY Patient taking differently: 40mg by mouth twice daily 10/29/17    Yes [name], NP  pravastatin (PRAVACHOL) 20 MG tablet Take 1 tablet (20 mg total) by mouth daily. 12/22/17  Yes [name], NP  triamcinolone (KENALOG) 0.025 % cream Apply 1 application topically daily as needed (psoriasis). 04/06/17  Yes [name], NP  potassium gluconate 595 (99 K) MG TABS tablet Take 595 mg by mouth See admin instructions. TAKES ONLY WHEN USING FUROSEMIDE    [provider]    Current Facility-Administered Medications  Medication Dose Route Frequency Provider Last Rate Last Dose  . 0.9 %  sodium chloride infusion   Intravenous Continuous Karmen Bongo, MD 125 mL/hr at 01/21/18 0931    . acetaminophen (TYLENOL) tablet 650 mg  650 mg Oral Q6H PRN Karmen Bongo, MD   650 mg at 01/20/18 2240   Or  . acetaminophen (TYLENOL) suppository 650 mg  650 mg Rectal Q6H PRN Karmen Bongo, MD      . albuterol (PROVENTIL) (2.5 MG/3ML) 0.083% nebulizer solution 2.5  mg  2.5 mg Nebulization Q2H PRN Karmen Bongo, MD      . amiodarone (PACERONE) tablet 200 mg  200 mg Oral Daily Karmen Bongo, MD      . doxazosin (CARDURA) tablet 4 mg  4 mg Oral Daily Karmen Bongo, MD      . ferrous sulfate tablet 325 mg  325 mg Oral BID Karmen Bongo, MD   325 mg at 01/20/18 2200  . fluticasone furoate-vilanterol (BREO ELLIPTA) 100-25 MCG/INH 1 puff  1 puff Inhalation Daily Karmen Bongo, MD      . insulin aspart (novoLOG) injection 0-15 Units  0-15 Units Subcutaneous TID WC Karmen Bongo, MD      . levothyroxine (SYNTHROID, LEVOTHROID) tablet 88 mcg  88 mcg Oral QAC breakfast Karmen Bongo, MD      . mirabegron ER [facility]) tablet 25 mg  25 mg Oral Daily Karmen Bongo, MD      . ondansetron [facility]) tablet 4 mg  4 mg Oral Q6H PRN Karmen Bongo, MD       Or  . ondansetron [facility]) injection 4 mg  4 mg Intravenous Q6H PRN Karmen Bongo, MD   4 mg at 01/20/18 2240  . pantoprazole (PROTONIX) 80 mg in sodium chloride 0.9 % 250 mL (0.32 mg/mL) infusion  8 mg/hr Intravenous Continuous Karmen Bongo, MD 25 mL/hr at 01/21/18 0931 8 mg/hr at 01/21/18 0931  . pravastatin (PRAVACHOL) tablet 20 mg  20 mg Oral Daily Karmen Bongo, MD      . sodium chloride flush (NS) 0.9 % injection 10-40 mL  10-40 mL Intracatheter PRN Karmen Bongo, MD       Current Outpatient Medications  Medication Sig Dispense Refill  . acetaminophen (TYLENOL) 500 MG tablet Take 500 mg by mouth every 6 (six) hours as needed for mild pain.    Marland Kitchen albuterol (PROVENTIL) (2.5 MG/3ML) 0.083% nebulizer solution USE 1 VIAL IN NEBULIZER EVERY 4 HOURS AND AS NEEDED. Generic: VENTOLIN (Patient taking differently: use 1 vial in nebulizer every 4 hours and as needed for wheezing) 60 vial 10  . albuterol (VENTOLIN HFA) 108 (90 Base) MCG/ACT inhaler Inhale 2 puffs into the lungs every 6 (six) hours as needed for wheezing or shortness of breath. 1 Inhaler 11  . amiodarone (PACERONE) 200 MG tablet Take 200  mg by mouth daily.    Marland Kitchen apixaban (ELIQUIS) 5 MG TABS tablet Take 1 tablet (5 mg total) by mouth 2 (two) times daily. 180 tablet 1  . cetirizine (ZYRTEC) 10  MG tablet Take 10 mg by mouth daily as needed for allergies.     . cholecalciferol (VITAMIN D) 1000 units tablet Take 1,000 Units by mouth at bedtime.     . doxazosin (CARDURA) 4 MG tablet Take 1 tablet (4 mg total) by mouth daily. 30 tablet 0  . ferrous sulfate 325 (65 FE) MG EC tablet Take 325 mg by mouth 2 (two) times daily.    . fluticasone furoate-vilanterol (BREO ELLIPTA) 100-25 MCG/INH AEPB Inhale into the lungs once daily (Patient taking differently: Inhale 1 puff into the lungs daily. ) 1 each 5  . furosemide (LASIX) 40 MG tablet Take 1 tablet (40 mg total) by mouth daily. 90 tablet 0  . glimepiride (AMARYL) 2 MG tablet Take one tablet by mouth once daily at breakfast to control glucose (Patient taking differently: Take 2 mg by mouth daily. ) 90 tablet 1  . Ketotifen Fumarate (ALLERGY EYE DROPS OP) Place 1 drop into both eyes 3 (three) times daily as needed (allergies).     . levothyroxine (SYNTHROID, LEVOTHROID) 88 MCG tablet Take 1 tablet (88 mcg total) by mouth daily. 90 tablet 3  . mirabegron ER (MYRBETRIQ) 25 MG TB24 tablet One daily to help bladder control (Patient taking differently: Take 25 mg by mouth daily. ) 90 tablet 5  . pantoprazole (PROTONIX) 40 MG tablet TAKE 1 TABLET BY MOUTH TWO  TIMES DAILY (Patient taking differently: 40mg by mouth twice daily) 180 tablet 1  . pravastatin (PRAVACHOL) 20 MG tablet Take 1 tablet (20 mg total) by mouth daily. 90 tablet 1  . triamcinolone (KENALOG) 0.025 % cream Apply 1 application topically daily as needed (psoriasis). 30 g 0  . potassium gluconate 595 (99 K) MG TABS tablet Take 595 mg by mouth See admin instructions. TAKES ONLY WHEN USING FUROSEMIDE      Allergies as of 01/20/2018 - Review Complete 01/20/2018  Allergen Reaction Noted  . Codeine Other (See Comments) 10/07/2012  .  Vesicare [solifenacin] Itching 03/02/2013    Family History  Problem Relation Age of Onset  . Diabetes Father   . Stroke Father   . Heart disease Father   . Liver cancer Sister   . Heart disease Brother   . Asthma Sister   . Arthritis Sister     Social History   Socioeconomic History  . Marital status: Divorced    Spouse name: Not on file  . Number of children: 1  . Years of education: Not on file  . Highest education level: Not on file  Occupational History  . 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 Financial resource strain: Not hard at all  . Food insecurity:    Worry: Never true    Inability: Never true  . Transportation needs:    Medical: No    Non-medical: No  Tobacco Use  . Smoking status: Former Smoker    Packs/day: 1.00    Years: 40.00    Pack years: 40.00    Last attempt to quit: 09/07/1989    Years since quitting: 28.3  . Smokeless tobacco: Never Used  Substance and Sexual Activity  . Alcohol use: No    Alcohol/week: 0.0 oz  . Drug use: No  . Sexual activity: Never  Lifestyle  . Physical activity:    Days per week: 0 days    Minutes per session: 0 min  . Stress: To some extent  Relationships  . Social connections:    Talks   on phone: More than three times a week    Gets together: Once a week    Attends religious service: Never    Active member of club or organization: No    Attends meetings of clubs or organizations: Never    Relationship status: Widowed  . Intimate partner violence:    Fear of current or ex partner: No    Emotionally abused: No    Physically abused: No    Forced sexual activity: No  Other Topics Concern  . Not on file  Social History Narrative   Her daughter & 3 grandchildren live in the area.      Review of Systems: As per HPI, all others negative  Physical Exam: Vital signs in last 24 hours: Temp:  [97.3 F (36.3 C)-98.7 F (37.1 C)] 98.6 F (37 C) (03/28 0554) Pulse Rate:   [69-82] 73 (03/28 1015) Resp:  [16-26] 23 (03/28 1015) BP: (97-144)/(31-86) 118/44 (03/28 1015) SpO2:  [92 %-100 %] 100 % (03/28 1015) Weight:  [225 lb (102.1 kg)] 225 lb (102.1 kg) (03/27 1600)   General:   Alert,  Well-developed, well-nourished, pleasant and cooperative in NAD Head:  Normocephalic and atraumatic. Eyes:  Sclera clear, no icterus.   Conjunctiva pink. Ears:  Normal auditory acuity. Nose:  No deformity, discharge,  or lesions. Mouth:  No deformity or lesions.  Oropharynx pink & moist. Neck:  Supple; no masses or thyromegaly. Lungs:  Clear throughout to auscultation.   No wheezes, crackles, or rhonchi. No acute distress. Heart:  Regular rate and rhythm; no murmurs, clicks, rubs,  or gallops. Abdomen:  Soft, nontender and nondistended. No masses, hepatosplenomegaly or hernias noted. Normal bowel sounds, without guarding, and without rebound.     Msk:  Symmetrical without gross deformities. Normal posture. Pulses:  Normal pulses noted. Extremities:  Without clubbing or edema. Neurologic:  Alert and  oriented x4;  grossly normal neurologically. Skin:  Intact without significant lesions or rashes. Cervical Nodes:  No significant cervical adenopathy. Psych:  Alert and cooperative. Normal mood and affect.   Lab Results: Recent Labs    01/20/18 1503 01/20/18 1513 01/20/18 1604 01/21/18 0543  WBC 15.4*  --   --  16.0*  HGB 4.2* 10.9* 4.4* 6.8*  HCT 13.4* 32.0* 14.2* 20.9*  PLT 246  --   --  172   BMET Recent Labs    01/20/18 1503 01/20/18 1513 01/21/18 0543  NA 136 135 139  K 4.1 4.1 3.9  CL 101 101 104  CO2 21*  --  23  GLUCOSE 224* 215* 170*  BUN 101* 95* 101*  CREATININE 2.35* 2.40* 2.31*  CALCIUM 8.3*  --  7.6*   LFT Recent Labs    01/20/18 1503  PROT 5.8*  ALBUMIN 3.0*  AST 21  ALT 16  ALKPHOS 40  BILITOT 0.5   PT/INR Recent Labs    01/20/18 1604  LABPROT 17.4*  INR 1.44    Studies/Results: Dg Chest 2 View  Result Date:  01/20/2018 CLINICAL DATA:  Weakness EXAM: CHEST - 2 VIEW COMPARISON:  02/18/2017 plain film examination, 10/08/2017 CT of the chest. FINDINGS: Cardiac shadow remains enlarged. Findings of prior TAVR are seen. The lungs are well aerated bilaterally without focal infiltrate or sizable effusion. Chronic blunting of the right costophrenic angle is noted laterally. The overall appearance is similar to that seen on the prior exam. IMPRESSION: No active cardiopulmonary disease. Electronically Signed   By: Mark  Lukens M.D.   On: 01/20/2018 15:54     [place] Ekg Site Rite  Result Date: 01/20/2018 If Site Rite image not attached, placement could not be confirmed due to current cardiac rhythm.  Impression:  1.  Anemia with dark stools. 2.  Chronic anticoagulation. 3.  Multiple medical problems.  Plan:  1.  Continue transfusions. 2.  Serial CBC. 3.  Diet ok today, NPO after midnight. 4.  PPI. 5.  Hold anticoagulation. 6.  Endoscopy tomorrow. 7.  Risks (bleeding, infection, bowel perforation that could require surgery, sedation-related changes in cardiopulmonary systems), benefits (identification and possible treatment of source of symptoms, exclusion of certain causes of symptoms), and alternatives (watchful waiting, radiographic imaging studies, empiric medical treatment) of upper endoscopy (EGD) were explained to patient/family in detail and patient wishes to proceed. 8.  Next step in management pending endoscopy findings.   LOS: 1 day   Nandita Mathenia M  01/21/2018, 10:32 AM  Cell [phone] If no answer or after 5 PM call [phone]

## 2018-01-21 NOTE — ED Notes (Signed)
Patient is irritable-she states she is unhappy about "sitting in this room all night" and would call her grandson and leave. Patient is encouraged to be patient.

## 2018-01-21 NOTE — ED Notes (Signed)
Pt provided with milk

## 2018-01-21 NOTE — ED Notes (Signed)
Attending MD paged to discuss patient's concerns

## 2018-01-21 NOTE — ED Notes (Signed)
Pt moved over to hospital bed; Purwick placed; meal tray at bedside. She is appreciative.

## 2018-01-22 ENCOUNTER — Inpatient Hospital Stay (HOSPITAL_COMMUNITY): Payer: Medicare Other | Admitting: Certified Registered Nurse Anesthetist

## 2018-01-22 ENCOUNTER — Encounter (HOSPITAL_COMMUNITY)
Admission: EM | Disposition: A | Discharge status: Home / Self Care | Payer: Self-pay | Source: Home / Self Care | Attending: Internal Medicine

## 2018-01-22 ENCOUNTER — Encounter (HOSPITAL_COMMUNITY): Payer: Self-pay | Admitting: *Deleted

## 2018-01-22 HISTORY — PX: ESOPHAGOGASTRODUODENOSCOPY (EGD) WITH PROPOFOL: SHX5813

## 2018-01-22 LAB — CBC
HCT: 23.8 % — ABNORMAL LOW (ref 36.0–46.0)
Hemoglobin: 7.7 g/dL — ABNORMAL LOW (ref 12.0–15.0)
MCH: 29.7 pg (ref 26.0–34.0)
MCHC: 32.4 g/dL (ref 30.0–36.0)
MCV: 91.9 fL (ref 78.0–100.0)
Platelets: 131 10*3/uL — ABNORMAL LOW (ref 150–400)
RBC: 2.59 MIL/uL — ABNORMAL LOW (ref 3.87–5.11)
RDW: 16.8 % — ABNORMAL HIGH (ref 11.5–15.5)
WBC: 9.7 10*3/uL (ref 4.0–10.5)

## 2018-01-22 LAB — BASIC METABOLIC PANEL
Anion gap: 9 (ref 5–15)
BUN: 64 mg/dL — ABNORMAL HIGH (ref 6–20)
CO2: 22 mmol/L (ref 22–32)
Calcium: 8 mg/dL — ABNORMAL LOW (ref 8.9–10.3)
Chloride: 111 mmol/L (ref 101–111)
Creatinine, Ser: 1.78 mg/dL — ABNORMAL HIGH (ref 0.44–1.00)
GFR calc Af Amer: 30 mL/min — ABNORMAL LOW (ref >60)
GFR calc non Af Amer: 26 mL/min — ABNORMAL LOW (ref >60)
Glucose, Bld: 148 mg/dL — ABNORMAL HIGH (ref 65–99)
Potassium: 3.9 mmol/L (ref 3.5–5.1)
Sodium: 142 mmol/L (ref 135–145)

## 2018-01-22 LAB — GLUCOSE, CAPILLARY
Glucose-Capillary: 147 mg/dL — ABNORMAL HIGH (ref 65–99)
Glucose-Capillary: 151 mg/dL — ABNORMAL HIGH (ref 65–99)
Glucose-Capillary: 155 mg/dL — ABNORMAL HIGH (ref 65–99)
Glucose-Capillary: 177 mg/dL — ABNORMAL HIGH (ref 65–99)
Glucose-Capillary: 203 mg/dL — ABNORMAL HIGH (ref 65–99)

## 2018-01-22 LAB — PREPARE RBC (CROSSMATCH)

## 2018-01-22 SURGERY — ESOPHAGOGASTRODUODENOSCOPY (EGD) WITH PROPOFOL
Anesthesia: Monitor Anesthesia Care | Laterality: Left

## 2018-01-22 MED ORDER — SODIUM CHLORIDE 0.9 % IV SOLN
Freq: Once | INTRAVENOUS | Status: AC
  Administered 2018-01-22: 13:00:00 via INTRAVENOUS

## 2018-01-22 MED ORDER — FUROSEMIDE 40 MG PO TABS
40.0000 mg | ORAL_TABLET | Freq: Every day | ORAL | Status: DC
  Administered 2018-01-22: 40 mg via ORAL

## 2018-01-22 MED ORDER — ALUM & MAG HYDROXIDE-SIMETH 200-200-20 MG/5ML PO SUSP
30.0000 mL | ORAL | Status: DC | PRN
  Administered 2018-01-22: 30 mL via ORAL
  Filled 2018-01-22: qty 30

## 2018-01-22 MED ORDER — SODIUM CHLORIDE 0.9 % IV SOLN: INTRAVENOUS | Status: DC

## 2018-01-22 MED ORDER — LACTATED RINGERS IV SOLN
INTRAVENOUS | Status: DC | PRN
  Administered 2018-01-22: 09:00:00 via INTRAVENOUS

## 2018-01-22 MED ORDER — PHENYLEPHRINE HCL 10 MG/ML IJ SOLN
INTRAMUSCULAR | Status: DC | PRN
  Administered 2018-01-22: 40 ug via INTRAVENOUS

## 2018-01-22 MED ORDER — LIDOCAINE HCL (CARDIAC) 20 MG/ML IV SOLN
INTRAVENOUS | Status: DC | PRN
  Administered 2018-01-22: 60 mg via INTRAVENOUS

## 2018-01-22 MED ORDER — GI COCKTAIL ~~LOC~~
30.0000 mL | Freq: Two times a day (BID) | ORAL | Status: DC | PRN
  Administered 2018-01-22 – 2018-01-23 (×2): 30 mL via ORAL
  Filled 2018-01-22 (×2): qty 30

## 2018-01-22 MED ORDER — PROPOFOL 500 MG/50ML IV EMUL
INTRAVENOUS | Status: DC | PRN
  Administered 2018-01-22: 100 ug/kg/min via INTRAVENOUS

## 2018-01-22 SURGICAL SUPPLY — 15 items

## 2018-01-22 NOTE — Transfer of Care (Signed)
Immediate Anesthesia Transfer of Care Note  Patient: Patricia [name]  Procedure(s) Performed: ESOPHAGOGASTRODUODENOSCOPY (EGD) WITH PROPOFOL (Left )  Patient Location: Endoscopy Unit  Anesthesia Type:MAC  Level of Consciousness: awake, alert , oriented, patient cooperative and responds to stimulation  Airway & Oxygen Therapy: Patient Spontanous Breathing and Patient connected to nasal cannula oxygen  Post-op Assessment: Report given to RN, Post -op Vital signs reviewed and stable and Patient moving all extremities X 4  Post vital signs: Reviewed and stable  Last Vitals:  Vitals Value Taken Time  BP    Temp    Pulse    Resp    SpO2      Last Pain:  Vitals:   01/22/18 0854  TempSrc: Oral  PainSc: 0-No pain      Patients Stated Pain Goal: 0 (32/91/91 6606)  Complications: No apparent anesthesia complications

## 2018-01-22 NOTE — Discharge Instructions (Signed)
Esophagogastroduodenoscopy, Care After Refer to this sheet in the next few weeks. These instructions provide you with information about caring for yourself after your procedure. Your health care provider may also give you more specific instructions. Your treatment has been planned according to current medical practices, but problems sometimes occur. Call your health care provider if you have any problems or questions after your procedure. What can I expect after the procedure? After the procedure, it is common to have:  A sore throat.  Nausea.  Bloating.  Dizziness.  Fatigue.  Follow these instructions at home:  Do not eat or drink anything until the numbing medicine (local anesthetic) has worn off and your gag reflex has returned. You will know that the local anesthetic has worn off when you can swallow comfortably.  Do not drive for 24 hours if you received a medicine to help you relax (sedative).  If your health care provider took a tissue sample for testing during the procedure, make sure to get your test results. This is your responsibility. Ask your health care provider or the department performing the test when your results will be ready.  Keep all follow-up visits as told by your health care provider. This is important. Contact a health care provider if:  You cannot stop coughing.  You are not urinating.  You are urinating less than usual. Get help right away if:  You have trouble swallowing.  You cannot eat or drink.  You have throat or chest pain that gets worse.  You are dizzy or light-headed.  You faint.  You have nausea or vomiting.  You have chills.  You have a fever.  You have severe abdominal pain.  You have black, tarry, or bloody stools. This information is not intended to replace advice given to you by your health care provider. Make sure you discuss any questions you have with your health care provider. Document Released: 09/29/2012 Document  Revised: 03/20/2016 Document Reviewed: 09/06/2015 Elsevier Interactive Patient Education  [employer].

## 2018-01-22 NOTE — Op Note (Signed)
[facility] Patient Name: Patricia [name] Procedure Date : 01/22/2018 MRN: [id number] Attending MD: Arta Silence , MD Date of Birth: [birth year] CSN: [id number] Age: 82 Admit Type: Inpatient Procedure:                Upper GI endoscopy Indications:              Acute post hemorrhagic anemia Providers:                Arta Silence, MD, Burtis Junes, RN, Alan Mulder,                            Technician Referring MD:              Medicines:                Monitored Anesthesia Care Complications:            No immediate complications. Estimated Blood Loss:     Estimated blood loss: none. Procedure:                Pre-Anesthesia Assessment:                           - Prior to the procedure, a History and Physical                            was performed, and patient medications and                            allergies were reviewed. The patient's tolerance of                            previous anesthesia was also reviewed. The risks                            and benefits of the procedure and the sedation                            options and risks were discussed with the patient.                            All questions were answered, and informed consent                            was obtained. Prior Anticoagulants: The patient has                            taken Eliquis (apixaban), last dose was 3 days                            prior to procedure. ASA Grade Assessment: III - A                            patient with severe systemic disease. After  reviewing the risks and benefits, the patient was                            deemed in satisfactory condition to undergo the                            procedure.                           After obtaining informed consent, the endoscope was                            passed under direct vision. Throughout the                            procedure, the patient's blood pressure, pulse, and                    oxygen saturations were monitored continuously. The                            EG-[id number] ([id number]) scope was introduced through the                            mouth, and advanced to the second part of duodenum.                            The upper GI endoscopy was accomplished without                            difficulty. The patient tolerated the procedure                            well. Scope In: Scope Out: Findings:      The examined esophagus was normal.      A few localized small erosions were found in the gastric fundus and in       the gastric body.      The exam of the stomach was otherwise normal.      Small tiny red spot in recess of fold in proximal duodenum, trauma       versus small avm, non-bleeding. The duodenal bulb, first portion of the       duodenum and second portion of the duodenum were otherwise normal.      No old or fresh blood was seen to the extent of our examination.       Estimated blood loss: none. Impression:               - Normal esophagus.                           - Erosive gastropathy.                           - Small red spot, non-bleeding, in proximal  duodenum. Normal duodenal bulb, first portion of                            the duodenum and second portion of the duodenum.                           - No active bleeding source identified. Very                            doubtful the gastric erosions and duodenal small                            red spot would yield significant hgb drop. Moderate Sedation:      None Recommendation:           - Return patient to hospital ward for ongoing care.                           - Soft diet today.                           - Continue to hold anticoagulation, if possible.                           - PPI.                           - To visualize the small bowel, perform video                            capsule endoscopy tomorrow.                           [facility] will follow.                           - Continue present medications. Procedure Code(s):        --- Professional ---                           [phone], Esophagogastroduodenoscopy, flexible,                            transoral; diagnostic, including collection of                            specimen(s) by brushing or washing, when performed                            (separate procedure) Diagnosis Code(s):        --- Professional ---                           K31.89, Other diseases of stomach and duodenum                           D62, Acute posthemorrhagic anemia CPT  copyright 2016 American Medical Association. All rights reserved. The codes documented in this report are preliminary and upon coder review may  be revised to meet current compliance requirements. Arta Silence, MD 01/22/2018 9:50:07 AM This report has been signed electronically. Number of Addenda: 0

## 2018-01-22 NOTE — Plan of Care (Signed)
  Problem: Activity: Goal: Risk for activity intolerance will decrease Outcome: Progressing   Problem: Elimination: Goal: Will not experience complications related to bowel motility Outcome: Progressing   Problem: Pain Managment: Goal: General experience of comfort will improve Outcome: Progressing   Problem: Safety: Goal: Ability to remain free from injury will improve Outcome: Progressing

## 2018-01-22 NOTE — Progress Notes (Signed)
Patient complained of abdominal burning given Maalox with no relief. Notified team of abdominal burning, patient was given gi cocktail, with no relief noted. Also complaining of shortness of breath patient was seen by respiratory.

## 2018-01-22 NOTE — Progress Notes (Signed)
Assumed care of pt from off going nurse. No changes in assessment. Condition stable at this time

## 2018-01-22 NOTE — Progress Notes (Signed)
PROGRESS NOTE    Patricia [name]  BEM:[id number] DOB: 04-21-1936 DOA: 01/20/2018 PCP: [name], NP   Brief Narrative: Patricia [name] is a 82 y.o. female with medical history significant of hypothyroidism; HTN;  DM; s/p TAVR; HLD; CHF; and afib on Eliquis presentedwith back/chest/abdominal pain. She started feeling weak and dizzy about the same time, takes iron and so has not noticed a change in her stools.   In ED Hgb 4.2.  On Eliquis for afib.  She is getting Interior and spatial designer.  Protonix drip, [facility] (Dr. Paulita Fujita) consulted  Assessment & Plan:   #Upper GI bleed, ongoing Patient underwent EGD this morning which showed erosive gastropathy and small red spot, non-bleeding, in proximal duodenum. Normal duodenal bulb, first portion of the duodenum and second portion of the duodenum. Per GI doubtful that findings could cause anemia noted in patient on admission. Patient will undergo capsule endoscopy on 3/30 to evaluate small intestine. Hemoglobin this morning is 7.7 up from 6.8. --Follow up on GI consult, appreciate recs --1 units pRBCs, given cardiac history will keep Hbg >8 --Continue to hold Eliquis none --Continue IV PPI --Soft Diet   #Acute blood loss anemia, stable Secondary to above, status post 4 units of pRBC. Hemoglobin 7.7 this morning, she may be continuing to lose blood. --Transfuse 1 unit of packed RBC --Follow-up on CBC in the morning -- Follow-up on capsule endoscopy results  #Acute on chronic kidney disease stage III Baseline creatinine 1.6-1.7.  Creatinine this morning is 1.78 down from 2.31.Hemodynamically mediated due to blood loss, monitor with transfusion.  #DM Recent A1c 7.3.  Blood glucose have been well controlled since admission, requiring minimal fast acting insulin. -Continue to hold Amaryl -Continue moderate SSI  #Hypothyroidism TSH 5.5 with normal T4.  Subclinical hypothyroidism in the setting of GI bleed. --Continue Synthroid --Recheck labs in 6  weeks  #Afib on Eliquis -Rate controlled with Amiodarone, continued -Eliquis held -She was given Eppie Gibson in the ER  #Hypertension Currently 96/79. Seems to be well controlled since admission. --Continue doxazosin 4 mg daily  #Hyperlipidemia --Continue Pravachol 20 mg daily  DVT prophylaxis:  SCDs Code Status:  Full code Family Communication: no family at bedside Disposition Plan:  Home when Hb stable, bleeding evaluated  Consults: [facility]     Procedures:   Antimicrobials: None   Subjective: Patient was eager for EGD this morning in order to find out where the bleeding is coming from.  Patient seems to be frustrated that no obvious source have been detected over the past few month.  Otherwise no acute complaint.  Objective: Vitals:   01/22/18 0854 01/22/18 0947 01/22/18 0956 01/22/18 1041  BP: 111/78 (!) 114/32 (!) 125/43 96/79  Pulse: 74 70 70 68  Resp: 18 15 (!) 21 (!) 23  Temp: 98 F (36.7 C) 98.3 F (36.8 C)    TempSrc: Oral Oral    SpO2: 97% 96% 96% 99%  Weight:        Intake/Output Summary (Last 24 hours) at 01/22/2018 1129 Last data filed at 01/22/2018 1126 Gross per 24 hour  Intake 420 ml  Output 900 ml  Net -480 ml   Filed Weights   01/20/18 1600  Weight: 225 lb (102.1 kg)    Examination:  General exam: elderly obese female, chronically ill-appearing, sitting up in bed, no distress Respiratory system: good air movement, clear bilaterally Cardiovascular system: S1 & S2 heard, RRR.  Gastrointestinal system: Abdomen is nondistended, soft and nontender.Normal bowel sounds heard. Central  nervous system: Alert and oriented. No focal neurological deficits. Extremities: Trace edema Skin: No rashes Psychiatry: Judgement and insight appear normal. Mood & affect appropriate.     Data Reviewed:   CBC: Recent Labs  Lab 01/20/18 1503 01/20/18 1513 01/20/18 1604 01/21/18 0543 01/22/18 1030  WBC 15.4*  --   --  16.0* 9.7  NEUTROABS 12.3*  --    --   --   --   HGB 4.2* 10.9* 4.4* 6.8* 7.7*  HCT 13.4* 32.0* 14.2* 20.9* 23.8*  MCV 93.1  --   --  90.5 91.9  PLT 246  --   --  172 160*   Basic Metabolic Panel: Recent Labs  Lab 01/20/18 1503 01/20/18 1513 01/21/18 0543 01/22/18 1030  NA 136 135 139 142  K 4.1 4.1 3.9 3.9  CL 101 101 104 111  CO2 21*  --  23 22  GLUCOSE 224* 215* 170* 148*  BUN 101* 95* 101* 64*  CREATININE 2.35* 2.40* 2.31* 1.78*  CALCIUM 8.3*  --  7.6* 8.0*   GFR: Estimated Creatinine Clearance: 29.3 mL/min (A) (by C-G formula based on SCr of 1.78 mg/dL (H)). Liver Function Tests: Recent Labs  Lab 01/20/18 1503  AST 21  ALT 16  ALKPHOS 40  BILITOT 0.5  PROT 5.8*  ALBUMIN 3.0*   Recent Labs  Lab 01/20/18 1503  LIPASE 29   No results for input(s): AMMONIA in the last 168 hours. Coagulation Profile: Recent Labs  Lab 01/20/18 1604  INR 1.44   Cardiac Enzymes: No results for input(s): CKTOTAL, CKMB, CKMBINDEX, TROPONINI in the last 168 hours. BNP (last 3 results) Recent Labs    10/14/17 1545  PROBNP 2,102*   HbA1C: No results for input(s): HGBA1C in the last 72 hours. CBG: Recent Labs  Lab 01/21/18 0120 01/21/18 0920 01/21/18 1708 01/22/18 0042 01/22/18 0804  GLUCAP 165* 165* 163* 151* 147*   Lipid Profile: No results for input(s): CHOL, HDL, LDLCALC, TRIG, CHOLHDL, LDLDIRECT in the last 72 hours. Thyroid Function Tests: Recent Labs    01/21/18 0543  TSH 5.553*  FREET4 0.89   Anemia Panel: No results for input(s): VITAMINB12, FOLATE, FERRITIN, TIBC, IRON, RETICCTPCT in the last 72 hours. Urine analysis:    Component Value Date/Time   COLORURINE YELLOW 10/10/2016 1409   APPEARANCEUR HAZY (A) 10/10/2016 1409   LABSPEC 1.020 10/10/2016 1409   PHURINE 5.0 10/10/2016 1409   GLUCOSEU NEGATIVE 10/10/2016 1409   HGBUR NEGATIVE 10/10/2016 1409   BILIRUBINUR NEGATIVE 10/10/2016 1409   BILIRUBINUR negative 07/18/2015 1337   BILIRUBINUR small 10/07/2012 1544   KETONESUR  NEGATIVE 10/10/2016 1409   PROTEINUR 30 (A) 10/10/2016 1409   UROBILINOGEN 0.2 07/18/2015 1337   NITRITE NEGATIVE 10/10/2016 1409   LEUKOCYTESUR NEGATIVE 10/10/2016 1409   Sepsis Labs: _0 (procalcitonin:4,lacticidven:4)  )No results found for this or any previous visit (from the past 240 hour(s)).       Radiology Studies: Dg Chest 2 View  Result Date: 01/20/2018 CLINICAL DATA:  Weakness EXAM: CHEST - 2 VIEW COMPARISON:  02/18/2017 plain film examination, 10/08/2017 CT of the chest. FINDINGS: Cardiac shadow remains enlarged. Findings of prior TAVR are seen. The lungs are well aerated bilaterally without focal infiltrate or sizable effusion. Chronic blunting of the right costophrenic angle is noted laterally. The overall appearance is similar to that seen on the prior exam. IMPRESSION: No active cardiopulmonary disease. Electronically Signed   By: Inez Catalina M.D.   On: 01/20/2018 15:54   [place] Ekg Site  Rite  Result Date: 01/20/2018 If Site Rite image not attached, placement could not be confirmed due to current cardiac rhythm.       Scheduled Meds: . amiodarone  200 mg Oral Daily  . doxazosin  4 mg Oral Daily  . ferrous sulfate  325 mg Oral BID  . fluticasone furoate-vilanterol  1 puff Inhalation Daily  . insulin aspart  0-15 Units Subcutaneous TID WC  . levothyroxine  88 mcg Oral QAC breakfast  . mirabegron ER  25 mg Oral Daily  . pravastatin  20 mg Oral Daily   Continuous Infusions: . pantoprozole (PROTONIX) infusion 8 mg/hr (01/21/18 2021)     LOS: 2 days    Time spent: 6mn     [facility] Page via [email or web address], password [email or web address] After 7PM please contact night-coverage  01/22/2018, 11:29 AM

## 2018-01-22 NOTE — Interval H&P Note (Signed)
History and Physical Interval Note:  01/22/2018 9:11 AM  Patricia [name]  has presented today for surgery, with the diagnosis of melena, anemia  The various methods of treatment have been discussed with the patient and family. After consideration of risks, benefits and other options for treatment, the patient has consented to  Procedure(s): ESOPHAGOGASTRODUODENOSCOPY (EGD) WITH PROPOFOL (Left) as a surgical intervention .  The patient's history has been reviewed, patient examined, no change in status, stable for surgery.  I have reviewed the patient's chart and labs.  Questions were answered to the patient's satisfaction.     Landry Dyke

## 2018-01-23 ENCOUNTER — Encounter (HOSPITAL_COMMUNITY)
Admission: EM | Disposition: A | Discharge status: Home / Self Care | Payer: Self-pay | Source: Home / Self Care | Attending: Internal Medicine

## 2018-01-23 HISTORY — PX: GIVENS CAPSULE STUDY: SHX5432

## 2018-01-23 LAB — BPAM RBC
Blood Product Expiration Date: 201903272359
Blood Product Expiration Date: 201903282359
Blood Product Expiration Date: 201904022359
Blood Product Expiration Date: 201904022359
Blood Product Expiration Date: 201904022359
Blood Product Expiration Date: 201904032359
ISSUE DATE / TIME: 201903271956
ISSUE DATE / TIME: 201903272244
ISSUE DATE / TIME: 201903280231
ISSUE DATE / TIME: 201903280518
ISSUE DATE / TIME: 201903281535
ISSUE DATE / TIME: 201903291300
Unit Type and Rh: 600
Unit Type and Rh: 600
Unit Type and Rh: 600
Unit Type and Rh: 6200
Unit Type and Rh: 6200
Unit Type and Rh: 6200

## 2018-01-23 LAB — TYPE AND SCREEN
ABO/RH(D): A POS
Antibody Screen: NEGATIVE
Unit division: 0
Unit division: 0
Unit division: 0
Unit division: 0
Unit division: 0
Unit division: 0

## 2018-01-23 LAB — CBC
HCT: 26.8 % — ABNORMAL LOW (ref 36.0–46.0)
Hemoglobin: 8.8 g/dL — ABNORMAL LOW (ref 12.0–15.0)
MCH: 30.8 pg (ref 26.0–34.0)
MCHC: 32.8 g/dL (ref 30.0–36.0)
MCV: 93.7 fL (ref 78.0–100.0)
Platelets: 130 10*3/uL — ABNORMAL LOW (ref 150–400)
RBC: 2.86 MIL/uL — ABNORMAL LOW (ref 3.87–5.11)
RDW: 17.1 % — ABNORMAL HIGH (ref 11.5–15.5)
WBC: 8.3 10*3/uL (ref 4.0–10.5)

## 2018-01-23 LAB — GLUCOSE, CAPILLARY
Glucose-Capillary: 155 mg/dL — ABNORMAL HIGH (ref 65–99)
Glucose-Capillary: 131 mg/dL — ABNORMAL HIGH (ref 65–99)
Glucose-Capillary: 190 mg/dL — ABNORMAL HIGH (ref 65–99)
Glucose-Capillary: 201 mg/dL — ABNORMAL HIGH (ref 65–99)

## 2018-01-23 LAB — BASIC METABOLIC PANEL
Anion gap: 8 (ref 5–15)
BUN: 46 mg/dL — ABNORMAL HIGH (ref 6–20)
CO2: 24 mmol/L (ref 22–32)
Calcium: 8 mg/dL — ABNORMAL LOW (ref 8.9–10.3)
Chloride: 107 mmol/L (ref 101–111)
Creatinine, Ser: 1.68 mg/dL — ABNORMAL HIGH (ref 0.44–1.00)
GFR calc Af Amer: 32 mL/min — ABNORMAL LOW (ref >60)
GFR calc non Af Amer: 28 mL/min — ABNORMAL LOW (ref >60)
Glucose, Bld: 159 mg/dL — ABNORMAL HIGH (ref 65–99)
Potassium: 4 mmol/L (ref 3.5–5.1)
Sodium: 139 mmol/L (ref 135–145)

## 2018-01-23 SURGERY — IMAGING PROCEDURE, GI TRACT, INTRALUMINAL, VIA CAPSULE
Anesthesia: LOCAL | Laterality: Left

## 2018-01-23 MED ORDER — FUROSEMIDE 10 MG/ML IJ SOLN
40.0000 mg | Freq: Every day | INTRAMUSCULAR | Status: DC
  Administered 2018-01-23 – 2018-01-24 (×2): 40 mg via INTRAVENOUS
  Filled 2018-01-23 (×2): qty 4

## 2018-01-23 MED ORDER — SUCRALFATE 1 GM/10ML PO SUSP
1.0000 g | Freq: Three times a day (TID) | ORAL | Status: DC
  Administered 2018-01-23 – 2018-01-26 (×12): 1 g via ORAL
  Filled 2018-01-23 (×12): qty 10

## 2018-01-23 MED ORDER — FUROSEMIDE 10 MG/ML IJ SOLN
40.0000 mg | Freq: Once | INTRAMUSCULAR | Status: AC
  Administered 2018-01-23: 40 mg via INTRAVENOUS
  Filled 2018-01-23: qty 4

## 2018-01-23 SURGICAL SUPPLY — 1 items: TOWEL COTTON PACK 4EA (MISCELLANEOUS) ×4 IMPLANT

## 2018-01-23 NOTE — Progress Notes (Addendum)
PROGRESS NOTE  CELE MOTE NFA:[id number] DOB: 04-28-1936 DOA: 01/20/2018 PCP: [name], NP   LOS: 3 days   Brief Narrative / Interim history: 82 year old female with history of hypothyroidism, hypertension, diabetes, aortic valve replacement bioprosthesis, hyperlipidemia, CHF, A. fib on chronic Eliquis was admitted with abdominal pain.  She was noted to be anemic with a hemoglobin of 4.2, fecal occult was positive.  Her Eliquis has been on hold, she received K Jaclyn Prime, she was placed on Protonix drip and [facility] was consulted  Assessment & Plan: Principal Problem:   GI bleed Active Problems:   DM type 2 (diabetes mellitus, type 2) ([place])   Hypothyroidism   PAF (paroxysmal atrial fibrillation) (HCC)   Acute blood loss anemia   Acute renal failure superimposed on stage 3 chronic kidney disease (H[facility])   GI bleed, suspected upper -Unclear source, patient underwent an EGD on 3/29 which showed erosive gastropathy and a small red spot nonbleeding in the proximal duodenum -Per GI this is not likely the cause of her anemia, and patient is to undergo capsule enteroscopy today -Continue to monitor CBC, hemoglobin stable this morning, clinically she appears to have stopped bleeding she has had no further bowel movements  Acute blood loss anemia -She received a total of 5 units of packed red blood cells, most recent on 2/29 for hemoglobin of 7.7 -Hemoglobin improved appropriately to 8.8 this morning, continue to monitor  Acute hypoxic respiratory failure with acute on chronic diastolic CHF -Patient with slightly worsening dyspnea, she has received 5 units of packed red blood cells total, she did receive Lasix yesterday however still has faint bibasilar crackles, will repeat the dose of Lasix today -Wean off oxygen as tolerated  Acute on chronic kidney disease stage III -Baseline creatinine 1.6-1.7, on admission it was 2.3, improving with fluids, -Creatinine 1.68  today, continue to monitor  Type 2 diabetes mellitus -Recent A1c 7.3, continue sliding scale  Hypothyroidism -Continue Synthroid, recheck TSH in 3-4 weeks as an outpatient  A. fib on Eliquis -Rate controlled with amiodarone, continue, Eliquis on hold, may need to reevaluate long-term anticoagulation at this point, this is her third GI bleed per patient  Hypertension -Soft blood pressures in the setting of bleed,  Hyperlipidemia -Continue statin   DVT prophylaxis: SCDs Code Status: Full code Family Communication: no family at bedside Disposition Plan: home when ready   Consultants:   GI  Procedures:   EGD 3/29 Impression:               - Normal esophagus.                           - Erosive gastropathy.                           - Small red spot, non-bleeding, in proximal                            duodenum. Normal duodenal bulb, first portion of                            the duodenum and second portion of the duodenum.                           - No active bleeding source  identified. Very                            doubtful the gastric erosions and duodenal small                            red spot would yield significant hgb drop.   Antimicrobials:  None   Subjective: - no chest pain, shortness of breath, no abdominal pain, nausea or vomiting.  Did complain of some shortness of breath last night with minimal ambulation  Objective: Vitals:   01/22/18 1609 01/23/18 0242 01/23/18 0727 01/23/18 0807  BP: 107/83   (!) 106/58  Pulse: 81 66  65  Resp: (!) 22 14  (!) 22  Temp: 98.2 F (36.8 C)  98.4 F (36.9 C)   TempSrc: Oral  Oral   SpO2: 98% 98%  98%  Weight:        Intake/Output Summary (Last 24 hours) at 01/23/2018 1053 Last data filed at 01/23/2018 0458 Gross per 24 hour  Intake 1167.08 ml  Output 2100 ml  Net -932.92 ml   Filed Weights   01/20/18 1600  Weight: 102.1 kg (225 lb)    Examination:  Constitutional: No distress Eyes: No scleral  icterus, lids and conjunctivae normal Respiratory: No wheezing, faint bibasilar crackles, normal respiratory effort Cardiovascular: Regular rate and rhythm, 3/6 SEM.  Trace lower extremity edema Abdomen: no tenderness. Bowel sounds positive.  Musculoskeletal: no clubbing / cyanosis.  Skin: no rashes Neurologic: Nonfocal, equal strength   Data Reviewed: I have independently reviewed following labs and imaging studies  CBC: Recent Labs  Lab 01/20/18 1503 01/20/18 1513 01/20/18 1604 01/21/18 0543 01/22/18 1030 01/23/18 0741  WBC 15.4*  --   --  16.0* 9.7 8.3  NEUTROABS 12.3*  --   --   --   --   --   HGB 4.2* 10.9* 4.4* 6.8* 7.7* 8.8*  HCT 13.4* 32.0* 14.2* 20.9* 23.8* 26.8*  MCV 93.1  --   --  90.5 91.9 93.7  PLT 246  --   --  172 131* 657*   Basic Metabolic Panel: Recent Labs  Lab 01/20/18 1503 01/20/18 1513 01/21/18 0543 01/22/18 1030 01/23/18 0741  NA 136 135 139 142 139  K 4.1 4.1 3.9 3.9 4.0  CL 101 101 104 111 107  CO2 21*  --  _0 GLUCOSE 224* 215* 170* 148* 159*  BUN 101* 95* 101* 64* 46*  CREATININE 2.35* 2.40* 2.31* 1.78* 1.68*  CALCIUM 8.3*  --  7.6* 8.0* 8.0*   GFR: Estimated Creatinine Clearance: 31 mL/min (A) (by C-G formula based on SCr of 1.68 mg/dL (H)). Liver Function Tests: Recent Labs  Lab 01/20/18 1503  AST 21  ALT 16  ALKPHOS 40  BILITOT 0.5  PROT 5.8*  ALBUMIN 3.0*   Recent Labs  Lab 01/20/18 1503  LIPASE 29   No results for input(s): AMMONIA in the last 168 hours. Coagulation Profile: Recent Labs  Lab 01/20/18 1604  INR 1.44   Cardiac Enzymes: No results for input(s): CKTOTAL, CKMB, CKMBINDEX, TROPONINI in the last 168 hours. BNP (last 3 results) Recent Labs    10/14/17 1545  PROBNP 2,102*   HbA1C: No results for input(s): HGBA1C in the last 72 hours. CBG: Recent Labs  Lab 01/22/18 0804 01/22/18 1209 01/22/18 1607 01/22/18 2132 01/23/18 0808  GLUCAP 147* 177* 203* 155* 155*  Lipid Profile: No results  for input(s): CHOL, HDL, LDLCALC, TRIG, CHOLHDL, LDLDIRECT in the last 72 hours. Thyroid Function Tests: Recent Labs    01/21/18 0543  TSH 5.553*  FREET4 0.89   Anemia Panel: No results for input(s): VITAMINB12, FOLATE, FERRITIN, TIBC, IRON, RETICCTPCT in the last 72 hours. Urine analysis:    Component Value Date/Time   COLORURINE YELLOW 10/10/2016 1409   APPEARANCEUR HAZY (A) 10/10/2016 1409   LABSPEC 1.020 10/10/2016 1409   PHURINE 5.0 10/10/2016 1409   GLUCOSEU NEGATIVE 10/10/2016 1409   HGBUR NEGATIVE 10/10/2016 1409   BILIRUBINUR NEGATIVE 10/10/2016 1409   BILIRUBINUR negative 07/18/2015 1337   BILIRUBINUR small 10/07/2012 1544   KETONESUR NEGATIVE 10/10/2016 1409   PROTEINUR 30 (A) 10/10/2016 1409   UROBILINOGEN 0.2 07/18/2015 1337   NITRITE NEGATIVE 10/10/2016 1409   LEUKOCYTESUR NEGATIVE 10/10/2016 1409   Sepsis Labs: Invalid input(s): PROCALCITONIN, LACTICIDVEN  No results found for this or any previous visit (from the past 240 hour(s)).    Radiology Studies: No results found.   Scheduled Meds: . amiodarone  200 mg Oral Daily  . doxazosin  4 mg Oral Daily  . ferrous sulfate  325 mg Oral BID  . fluticasone furoate-vilanterol  1 puff Inhalation Daily  . furosemide  40 mg Intravenous Daily  . insulin aspart  0-15 Units Subcutaneous TID WC  . levothyroxine  88 mcg Oral QAC breakfast  . mirabegron ER  25 mg Oral Daily  . pravastatin  20 mg Oral Daily  . sucralfate  1 g Oral TID WC & HS   Continuous Infusions: . pantoprozole (PROTONIX) infusion 8 mg/hr (01/23/18 0806)   Marzetta Board, MD, PhD [facility] Pager [phone]  If 7PM-7AM, please contact night-coverage [email or web address] Password [facility] 01/23/2018, 10:53 AM

## 2018-01-23 NOTE — Progress Notes (Signed)
Here for capsule endoscopy at the patient's bedside. Instructions were given and patient verbalized understanding. Also gave instructions to patient's RN. Patient did not have any trouble swallowing the pill camera. Dr. Michail Sermon walked in shortly after swallowing and reiterated instructions with the patient. Floor RN can call us anytime.

## 2018-01-23 NOTE — Progress Notes (Signed)
[facility] Gastroenterology Progress Note  Patricia [name] 82 y.o. 23-Feb-1936   Subjective: Capsule endoscopy today. Complaining of abd pain. Feels ok. Lying in bed. GI endoscopy nurse in room.  Objective: Vital signs: Vitals:   01/23/18 0727 01/23/18 0807  BP:  (!) 106/58  Pulse:  65  Resp:  (!) 22  Temp: 98.4 F (36.9 C)   SpO2:  98%    Physical Exam: Gen: lethargic, elderly, obese, no acute distress  HEENT: anicteric sclera CV: RRR Chest: CTA B Abd: diffuse tenderness with guarding, soft, nondistended, +BS, obese  Lab Results: Recent Labs    01/22/18 1030 01/23/18 0741  NA 142 139  K 3.9 4.0  CL 111 107  CO2 22 24  GLUCOSE 148* 159*  BUN 64* 46*  CREATININE 1.78* 1.68*  CALCIUM 8.0* 8.0*   Recent Labs    01/20/18 1503  AST 21  ALT 16  ALKPHOS 40  BILITOT 0.5  PROT 5.8*  ALBUMIN 3.0*   Recent Labs    01/20/18 1503  01/22/18 1030 01/23/18 0741  WBC 15.4*   < > 9.7 8.3  NEUTROABS 12.3*  --   --   --   HGB 4.2*   < > 7.7* 8.8*  HCT 13.4*   < > 23.8* 26.8*  MCV 93.1   < > 91.9 93.7  PLT 246   < > 131* 130*   < > = values in this interval not displayed.      Assessment/Plan: Obscure occult bleeding - s/p EGD without a definite source seen; capsule endoscopy in process today and results should be ready tomorrow afternoon. Hgb 8.8. Continue supportive care. Diet per capsule endo orders.   [place] C. 01/23/2018, 11:59 AM  Pager [phone]  AFTER 5 PM or on weekends please call [phone] ID: Patricia [name], female   DOB: October 02, 1936, 82 y.o.   MRN: [id number]

## 2018-01-23 NOTE — Progress Notes (Signed)
Patient transferred to bsc then back to bed, sob resolved at this time. Minimal dyspnea noted with ADLs. Denies any abdominal burning, but attempted to have BM unable to.

## 2018-01-23 NOTE — Procedures (Signed)
Called for a prn treatment per RN.  Patient assessed earlier in the shift, found to have fine crackles throughout lung fields.  Patient came in with GI bleed and received multiple units of blood.  Discussed with RN that patient could possibly use an additional dose of lasix.  Treatment given this time with assessment, patient continues to have crackles throughout, endorses SOB.  Spoke with RN again about lasix.

## 2018-01-23 NOTE — Progress Notes (Signed)
Patient states that after medications, drinking milk and eating some crackers her stomach burning has improved. But continues to complain of shortness of breath. Notified team.

## 2018-01-24 ENCOUNTER — Encounter (HOSPITAL_COMMUNITY): Payer: Self-pay | Admitting: Gastroenterology

## 2018-01-24 DIAGNOSIS — N178 Other acute kidney failure: Secondary | ICD-10-CM

## 2018-01-24 DIAGNOSIS — I5033 Acute on chronic diastolic (congestive) heart failure: Secondary | ICD-10-CM

## 2018-01-24 DIAGNOSIS — E039 Hypothyroidism, unspecified: Secondary | ICD-10-CM

## 2018-01-24 DIAGNOSIS — N183 Chronic kidney disease, stage 3 (moderate): Secondary | ICD-10-CM

## 2018-01-24 DIAGNOSIS — K922 Gastrointestinal hemorrhage, unspecified: Secondary | ICD-10-CM

## 2018-01-24 DIAGNOSIS — E119 Type 2 diabetes mellitus without complications: Secondary | ICD-10-CM

## 2018-01-24 DIAGNOSIS — I480 Paroxysmal atrial fibrillation: Secondary | ICD-10-CM

## 2018-01-24 LAB — CBC
HCT: 27.3 % — ABNORMAL LOW (ref 36.0–46.0)
Hemoglobin: 8.6 g/dL — ABNORMAL LOW (ref 12.0–15.0)
MCH: 29.8 pg (ref 26.0–34.0)
MCHC: 31.5 g/dL (ref 30.0–36.0)
MCV: 94.5 fL (ref 78.0–100.0)
Platelets: 135 10*3/uL — ABNORMAL LOW (ref 150–400)
RBC: 2.89 MIL/uL — ABNORMAL LOW (ref 3.87–5.11)
RDW: 16.9 % — ABNORMAL HIGH (ref 11.5–15.5)
WBC: 7.6 10*3/uL (ref 4.0–10.5)

## 2018-01-24 LAB — GLUCOSE, CAPILLARY
Glucose-Capillary: 174 mg/dL — ABNORMAL HIGH (ref 65–99)
Glucose-Capillary: 141 mg/dL — ABNORMAL HIGH (ref 65–99)
Glucose-Capillary: 166 mg/dL — ABNORMAL HIGH (ref 65–99)
Glucose-Capillary: 212 mg/dL — ABNORMAL HIGH (ref 65–99)

## 2018-01-24 MED ORDER — PANTOPRAZOLE SODIUM 40 MG PO TBEC
40.0000 mg | DELAYED_RELEASE_TABLET | Freq: Two times a day (BID) | ORAL | Status: DC
  Administered 2018-01-24 – 2018-01-26 (×5): 40 mg via ORAL
  Filled 2018-01-24 (×5): qty 1

## 2018-01-24 MED ORDER — DOCUSATE SODIUM 100 MG PO CAPS
100.0000 mg | ORAL_CAPSULE | Freq: Every day | ORAL | Status: DC | PRN
  Administered 2018-01-24: 100 mg via ORAL
  Filled 2018-01-24: qty 1

## 2018-01-24 MED ORDER — SENNA 8.6 MG PO TABS
1.0000 | ORAL_TABLET | Freq: Once | ORAL | Status: AC
  Administered 2018-01-24: 8.6 mg via ORAL
  Filled 2018-01-24: qty 1

## 2018-01-24 MED ORDER — FUROSEMIDE 40 MG PO TABS
40.0000 mg | ORAL_TABLET | Freq: Every day | ORAL | Status: DC
  Administered 2018-01-25 – 2018-01-26 (×2): 40 mg via ORAL
  Filled 2018-01-24 (×2): qty 1

## 2018-01-24 NOTE — Progress Notes (Signed)
PROGRESS NOTE    Patricia [name]  SWN:[id number] DOB: Aug 31, 1936 DOA: 01/20/2018 PCP: [name], NP    Brief Narrative:  82 year old female who presented with back pain, chest pain and abdominal pain. She does have significant past medical history for hypothyroidism, hypertension, type 2 diabetes mellitus, systolic heart failure, status post TAVR, atrial fibrillation on anticoagulation. Patient reported worsening symptoms over last several weeks prior to hospitalization, generalize weakness, ambulatory dysfunction, dizziness. Abdominal pain associated with diarrhea. Initial physical examination blood pressure 113/64, heart rate 76, respiratory rate 19, oxygen saturation 100%. Moist mucous membranes, lungs clear to auscultation bilaterally, heart S1-S2 present and rhythmic, abdomen soft nontender, no lower restaurant edema. Sodium 136, potassium 4.1, chloride 11, bicarbonate 21, glucose 224, BUN 101, creatinine 2.34, AST 21, AST 16, white count 15.4, hemoglobin 4.2, hematocrit 13.4, platelets 246. Chest film with left rotation, no infiltrates, EKG sinus rhythm, left axis deviation, left bundle branch block.  Patient was admitted to the hospital witt the working diagnosis acute blood loss anemia due to upper GI bleed.  Assessment & Plan:   Principal Problem:   GI bleed Active Problems:   DM type 2 (diabetes mellitus, type 2) (HCC)   Hypothyroidism   PAF (paroxysmal atrial fibrillation) (HCC)   Acute blood loss anemia   Acute renal failure superimposed on stage 3 chronic kidney disease (HCC)   1. Upper GI bleed. Hb and hct have been stable, 8.8 to 8.6, no signs of active bleeding, continue to hold on anticoagulation for now. Patient sp 4 units PRBC transfusion on admission. Follow on capsule endoscopy results. Continue sucralfate, will add pantoprazole bid.   2. Acute blood loss anemia in the setting of anemic of iron deficiency. Will continue GI workup, transfusion as needed with a  restrictive strategy, hb less than 8, or rapid decompensation. Follow cell count in am, continue iron supplements.  3. Acute on chronic diastolic heart failure exacerbation. Clinically euvolemic, will continue blood pressure monitoring, furosemide 40 mg daily to keep negative fluid balance. Old records personally reviewed echocardiogram from 09/2017 with LV EF 60 to 65%, positive left ventricle hypertrophy.   4. AKI on ckd stage 3. Stable renal function with serum cr at 1,69 with K at 4,0 and serum bicarbonate at 24. Will continue to follow up on renal panel in am. Avoid hypotension or nephrotoxic medications.   5. Atrial fibrillation chronic. Continue rate control with amiodarone, holding anticoagulation due to acute anemia.   6. HTN. Will continue blood pressure monitoring  7. Dyslipidemia. Continue statin therapy.   8. T2DM. Will continue glucose cover and monitoring with insulin sliding scale, patient tolerating po well. Capillary glucose   9. Hypothyroid. Continue levothyroxine.    DVT prophylaxis: scd  Code Status:  full Family Communication: no family at the bedside Disposition Plan:  Home when GI workup completed.   Consultants:     Procedures:     Antimicrobials:       Subjective: No further abdominal pain, no nausea or vomiting, has been out of the bed to the chair, no chest pain or dyspnea, no hematochezia or hematemesis.   Objective: Vitals:   01/24/18 0412 01/24/18 0614 01/24/18 0840 01/24/18 0853  BP: (!) 130/42 (!) 125/46  (!) 134/38  Pulse: 62   (!) 59  Resp: 18   19  Temp:    98.3 F (36.8 C)  TempSrc:    Other (Comment)  SpO2: 97%  97% 99%  Weight:      Height:  Intake/Output Summary (Last 24 hours) at 01/24/2018 1137 Last data filed at 01/24/2018 9892 Gross per 24 hour  Intake 240 ml  Output 1250 ml  Net -1010 ml   Filed Weights   01/20/18 1600 01/23/18 1107  Weight: 102.1 kg (225 lb) 102.1 kg (225 lb)    Examination:    General: Not in pain or dyspnea, deconditioned Neurology: Awake and alert, non focal  E ENT: mild pallor, no icterus, oral mucosa moist Cardiovascular: No JVD. S1-S2 present, rhythmic, no gallops, rubs, or murmurs. No lower extremity edema. Pulmonary: decreased breath sounds bilaterally at bases, adequate air movement, no wheezing, rhonchi or rales. Gastrointestinal. Abdomen protuberant with no distention, no organomegaly, non tender, no rebound or guarding Skin. No rashes Musculoskeletal: no joint deformities     Data Reviewed: I have personally reviewed following labs and imaging studies  CBC: Recent Labs  Lab 01/20/18 1503 01/20/18 1513 01/20/18 1604 01/21/18 0543 01/22/18 1030 01/23/18 0741  WBC 15.4*  --   --  16.0* 9.7 8.3  NEUTROABS 12.3*  --   --   --   --   --   HGB 4.2* 10.9* 4.4* 6.8* 7.7* 8.8*  HCT 13.4* 32.0* 14.2* 20.9* 23.8* 26.8*  MCV 93.1  --   --  90.5 91.9 93.7  PLT 246  --   --  172 131* 119*   Basic Metabolic Panel: Recent Labs  Lab 01/20/18 1503 01/20/18 1513 01/21/18 0543 01/22/18 1030 01/23/18 0741  NA 136 135 139 142 139  K 4.1 4.1 3.9 3.9 4.0  CL 101 101 104 111 107  CO2 21*  --  _0 GLUCOSE 224* 215* 170* 148* 159*  BUN 101* 95* 101* 64* 46*  CREATININE 2.35* 2.40* 2.31* 1.78* 1.68*  CALCIUM 8.3*  --  7.6* 8.0* 8.0*   GFR: Estimated Creatinine Clearance: 31 mL/min (A) (by C-G formula based on SCr of 1.68 mg/dL (H)). Liver Function Tests: Recent Labs  Lab 01/20/18 1503  AST 21  ALT 16  ALKPHOS 40  BILITOT 0.5  PROT 5.8*  ALBUMIN 3.0*   Recent Labs  Lab 01/20/18 1503  LIPASE 29   No results for input(s): AMMONIA in the last 168 hours. Coagulation Profile: Recent Labs  Lab 01/20/18 1604  INR 1.44   Cardiac Enzymes: No results for input(s): CKTOTAL, CKMB, CKMBINDEX, TROPONINI in the last 168 hours. BNP (last 3 results) Recent Labs    10/14/17 1545  PROBNP 2,102*   HbA1C: No results for input(s): HGBA1C  in the last 72 hours. CBG: Recent Labs  Lab 01/23/18 0808 01/23/18 1218 01/23/18 1724 01/23/18 2116 01/24/18 0816  GLUCAP 155* 131* 190* 201* 174*   Lipid Profile: No results for input(s): CHOL, HDL, LDLCALC, TRIG, CHOLHDL, LDLDIRECT in the last 72 hours. Thyroid Function Tests: No results for input(s): TSH, T4TOTAL, FREET4, T3FREE, THYROIDAB in the last 72 hours. Anemia Panel: No results for input(s): VITAMINB12, FOLATE, FERRITIN, TIBC, IRON, RETICCTPCT in the last 72 hours.    Radiology Studies: I have reviewed all of the imaging during this hospital visit personally     Scheduled Meds: . amiodarone  200 mg Oral Daily  . doxazosin  4 mg Oral Daily  . ferrous sulfate  325 mg Oral BID  . fluticasone furoate-vilanterol  1 puff Inhalation Daily  . furosemide  40 mg Intravenous Daily  . insulin aspart  0-15 Units Subcutaneous TID WC  . levothyroxine  88 mcg Oral QAC breakfast  . mirabegron ER  25 mg Oral Daily  . pravastatin  20 mg Oral Daily  . sucralfate  1 g Oral TID WC & HS   Continuous Infusions:   LOS: 4 days        Mauricio Gerome Apley, MD [facility] Pager [phone]

## 2018-01-24 NOTE — Progress Notes (Signed)
[facility] Gastroenterology Progress Note  Patricia [name] 82 y.o. Dec 27, 1935   Subjective: Denies abdominal pain. Feels ok. Sitting in bedside chair eating solid food for lunch.  Objective: Vital signs: Vitals:   01/24/18 0853 01/24/18 1208  BP: (!) 134/38 (!) 137/54  Pulse: (!) 59 (!) 59  Resp: 19 17  Temp: 98.3 F (36.8 C) 98.2 F (36.8 C)  SpO2: 99% 99%    Physical Exam: Gen: lethargic, no acute distress, obese, pleasant HEENT: anicteric sclera CV: RRR Chest: CTA B Abd: periumbilical tenderness with guarding, soft, nondistended, +BS   Lab Results: Recent Labs    01/22/18 1030 01/23/18 0741  NA 142 139  K 3.9 4.0  CL 111 107  CO2 22 24  GLUCOSE 148* 159*  BUN 64* 46*  CREATININE 1.78* 1.68*  CALCIUM 8.0* 8.0*   No results for input(s): AST, ALT, ALKPHOS, BILITOT, PROT, ALBUMIN in the last 72 hours. Recent Labs    01/22/18 1030 01/23/18 0741  WBC 9.7 8.3  HGB 7.7* 8.8*  HCT 23.8* 26.8*  MCV 91.9 93.7  PLT 131* 130*      Assessment/Plan: Obscure occult bleeding - capsule endoscopy did NOT show any active bleeding. Area of thickened small bowel folds in mid to distal small bowel of unclear significance. Hgb pending from today. Abd/pelvis CT enterography ordered for tomorrow morning and if that is unrevealing and Hgb stable then ok to go home tomorrow from GI standpoint. NPO p MN for CT scan. Dr. Oletta Lamas to f/u tomorrow from [facility] GI.   [place] C. 01/24/2018, 1:28 PM  Pager [phone]  AFTER 5 PM or on weekends please call [phone] ID: Patricia [name], female   DOB: Jun 08, 1936, 82 y.o.   MRN: [id number]

## 2018-01-24 NOTE — Plan of Care (Signed)
Progressing.

## 2018-01-25 ENCOUNTER — Inpatient Hospital Stay (HOSPITAL_COMMUNITY): Payer: Medicare Other

## 2018-01-25 DIAGNOSIS — D649 Anemia, unspecified: Secondary | ICD-10-CM

## 2018-01-25 LAB — BASIC METABOLIC PANEL
Anion gap: 7 (ref 5–15)
BUN: 34 mg/dL — ABNORMAL HIGH (ref 6–20)
CO2: 28 mmol/L (ref 22–32)
Calcium: 8.5 mg/dL — ABNORMAL LOW (ref 8.9–10.3)
Chloride: 104 mmol/L (ref 101–111)
Creatinine, Ser: 1.45 mg/dL — ABNORMAL HIGH (ref 0.44–1.00)
GFR calc Af Amer: 39 mL/min — ABNORMAL LOW (ref >60)
GFR calc non Af Amer: 33 mL/min — ABNORMAL LOW (ref >60)
Glucose, Bld: 160 mg/dL — ABNORMAL HIGH (ref 65–99)
Potassium: 4.1 mmol/L (ref 3.5–5.1)
Sodium: 139 mmol/L (ref 135–145)

## 2018-01-25 LAB — CBC WITH DIFFERENTIAL/PLATELET
Basophils Absolute: 0 10*3/uL (ref 0.0–0.1)
Basophils Relative: 0 %
Eosinophils Absolute: 0.3 10*3/uL (ref 0.0–0.7)
Eosinophils Relative: 4 %
HCT: 27.1 % — ABNORMAL LOW (ref 36.0–46.0)
Hemoglobin: 8.4 g/dL — ABNORMAL LOW (ref 12.0–15.0)
Lymphocytes Relative: 11 %
Lymphs Abs: 0.9 10*3/uL (ref 0.7–4.0)
MCH: 29.8 pg (ref 26.0–34.0)
MCHC: 31 g/dL (ref 30.0–36.0)
MCV: 96.1 fL (ref 78.0–100.0)
Monocytes Absolute: 0.6 10*3/uL (ref 0.1–1.0)
Monocytes Relative: 8 %
Neutro Abs: 5.8 10*3/uL (ref 1.7–7.7)
Neutrophils Relative %: 77 %
Platelets: 135 10*3/uL — ABNORMAL LOW (ref 150–400)
RBC: 2.82 MIL/uL — ABNORMAL LOW (ref 3.87–5.11)
RDW: 16.6 % — ABNORMAL HIGH (ref 11.5–15.5)
WBC: 7.5 10*3/uL (ref 4.0–10.5)

## 2018-01-25 LAB — GLUCOSE, CAPILLARY
Glucose-Capillary: 156 mg/dL — ABNORMAL HIGH (ref 65–99)
Glucose-Capillary: 139 mg/dL — ABNORMAL HIGH (ref 65–99)
Glucose-Capillary: 174 mg/dL — ABNORMAL HIGH (ref 65–99)
Glucose-Capillary: 135 mg/dL — ABNORMAL HIGH (ref 65–99)

## 2018-01-25 MED ORDER — BARIUM SULFATE 0.1 % PO SUSP
ORAL | Status: AC
  Filled 2018-01-25: qty 1

## 2018-01-25 MED ORDER — BISACODYL 5 MG PO TBEC: 10.0000 mg | DELAYED_RELEASE_TABLET | Freq: Once | ORAL | Status: DC

## 2018-01-25 MED ORDER — BARIUM SULFATE 0.1 % PO SUSP
ORAL | Status: AC
  Filled 2018-01-25: qty 2

## 2018-01-25 MED ORDER — FLEET ENEMA 7-19 GM/118ML RE ENEM: 1.0000 | ENEMA | Freq: Once | RECTAL | Status: DC

## 2018-01-25 MED ORDER — IOPAMIDOL (ISOVUE-300) INJECTION 61%
100.0000 mL | Freq: Once | INTRAVENOUS | Status: AC | PRN
  Administered 2018-01-25: 100 mL via INTRAVENOUS

## 2018-01-25 MED ORDER — ORAL CARE MOUTH RINSE: 15.0000 mL | Freq: Two times a day (BID) | OROMUCOSAL | Status: DC

## 2018-01-25 NOTE — Progress Notes (Signed)
PROGRESS NOTE    Patricia [name]  KPT:[id number] DOB: 03-08-1936 DOA: 01/20/2018 PCP: [name], NP    Brief Narrative:  82 year old female who presented with back pain, chest pain and abdominal pain. She does have significant past medical history for hypothyroidism, hypertension, type 2 diabetes mellitus, systolic heart failure, status post TAVR, atrial fibrillation on anticoagulation. Patient reported worsening symptoms over last several weeks prior to hospitalization, generalize weakness, ambulatory dysfunction, dizziness. Abdominal pain associated with diarrhea. Initial physical examination blood pressure 113/64, heart rate 76, respiratory rate 19, oxygen saturation 100%. Moist mucous membranes, lungs clear to auscultation bilaterally, heart S1-S2 present and rhythmic, abdomen soft nontender, no lower restaurant edema. Sodium 136, potassium 4.1, chloride 11, bicarbonate 21, glucose 224, BUN 101, creatinine 2.34, AST 21, AST 16, white count 15.4, hemoglobin 4.2, hematocrit 13.4, platelets 246. Chest film with left rotation, no infiltrates, EKG sinus rhythm, left axis deviation, left bundle branch block.  Patient was admitted to the hospital witt the working diagnosis acute blood loss anemia due to upper GI bleed.   Assessment & Plan:   Principal Problem:   GI bleed Active Problems:   DM type 2 (diabetes mellitus, type 2) (HCC)   Hypothyroidism   PAF (paroxysmal atrial fibrillation) (HCC)   Acute blood loss anemia   Acute renal failure superimposed on stage 3 chronic kidney disease (HCC)   1. Upper GI bleed. sp 4 units PRBC transfusion. CT abdomen with no new changes, will continue supportive medical care, hold on anticoagulation and follow up with GI clinic as outpatient, follow on capsule endoscopy results. This am very constipated and having abdominal pain, will add dulcolax and feet enema. Hold discharge until improvement of abdominal pain.   2. Acute blood loss anemia in  the setting of anemic of iron deficiency. Hb stable at 8.4, with no signs of further bleeding, will follow cell count in am.  3. Acute on chronic diastolic heart failure exacerbation, 09/2017 with LV EF 60 to 65%, positive left ventricle hypertrophy. Continue furosemide 40 mg daily per her home regimen. Clinically euvolemic. \  4. AKI on ckd stage 3. Renal function with serum cr down to 1,45 with serum K at 4.1 and serum bicarbonate at 28.   5. Atrial fibrillation chronic. Rate control with amiodarone. No anticoagulation for now. May be candidate for low dose aspirin, until completes GI workup.   6. HTN. Blood pressure has remained stable.   7. Dyslipidemia. On statin.   8. T2DM. Glucose cover and monitoring with insulin sliding scale, with capillary glucose 174, 141, 166, 21, 156, 139, 174.   9. Hypothyroid. On levothyroxine.    DVT prophylaxis: scd  Code Status:  full Family Communication: no family at the bedside Disposition Plan:  Home in am after bowel movement and improvement of abdominal pain.    Consultants:     Procedures:     Antimicrobials:    Subjective: Patient with significant abdominal pain, has been constipated, for the last 4 days, no nausea or vomiting. No chest pain or dyspnea.   Objective: Vitals:   01/24/18 1208 01/24/18 1629 01/24/18 2051 01/25/18 0420  BP: (!) 137/54 120/63 (!) 118/48 (!) 127/46  Pulse: (!) 59 63 66 68  Resp: _0 Temp: 98.2 F (36.8 C) 98.2 F (36.8 C) 99.1 F (37.3 C) 98.2 F (36.8 C)  TempSrc: Oral Oral Oral Oral  SpO2: 99% 98% 100% 98%  Weight:      Height:  No intake or output data in the 24 hours ending 01/25/18 1401 Filed Weights   01/20/18 1600 01/23/18 1107  Weight: 102.1 kg (225 lb) 102.1 kg (225 lb)    Examination:   General: Not in pain or dyspnea, deconditioned Neurology: Awake and alert, non focal  E ENT: mild pallor, no icterus, oral mucosa moist Cardiovascular: No JVD.  S1-S2 present, rhythmic, no gallops, rubs, or murmurs. No lower extremity edema. Pulmonary: decreased breath sounds bilaterally at bases, no wheezing, rhonchi or rales. Gastrointestinal. Abdomen protuberant and distended, tender to superficial palpation, no organomegaly, no rebound or guarding Skin. No rashes Musculoskeletal: no joint deformities     Data Reviewed: I have personally reviewed following labs and imaging studies  CBC: Recent Labs  Lab 01/20/18 1503  01/21/18 0543 01/22/18 1030 01/23/18 0741 01/24/18 1410 01/25/18 0446  WBC 15.4*  --  16.0* 9.7 8.3 7.6 7.5  NEUTROABS 12.3*  --   --   --   --   --  5.8  HGB 4.2*   < > 6.8* 7.7* 8.8* 8.6* 8.4*  HCT 13.4*   < > 20.9* 23.8* 26.8* 27.3* 27.1*  MCV 93.1  --  90.5 91.9 93.7 94.5 96.1  PLT 246  --  172 131* 130* 135* 135*   < > = values in this interval not displayed.   Basic Metabolic Panel: Recent Labs  Lab 01/20/18 1503 01/20/18 1513 01/21/18 0543 01/22/18 1030 01/23/18 0741 01/25/18 0446  NA 136 135 139 142 139 139  K 4.1 4.1 3.9 3.9 4.0 4.1  CL 101 101 104 111 107 104  CO2 21*  --  _0 GLUCOSE 224* 215* 170* 148* 159* 160*  BUN 101* 95* 101* 64* 46* 34*  CREATININE 2.35* 2.40* 2.31* 1.78* 1.68* 1.45*  CALCIUM 8.3*  --  7.6* 8.0* 8.0* 8.5*   GFR: Estimated Creatinine Clearance: 35.9 mL/min (A) (by C-G formula based on SCr of 1.45 mg/dL (H)). Liver Function Tests: Recent Labs  Lab 01/20/18 1503  AST 21  ALT 16  ALKPHOS 40  BILITOT 0.5  PROT 5.8*  ALBUMIN 3.0*   Recent Labs  Lab 01/20/18 1503  LIPASE 29   No results for input(s): AMMONIA in the last 168 hours. Coagulation Profile: Recent Labs  Lab 01/20/18 1604  INR 1.44   Cardiac Enzymes: No results for input(s): CKTOTAL, CKMB, CKMBINDEX, TROPONINI in the last 168 hours. BNP (last 3 results) Recent Labs    10/14/17 1545  PROBNP 2,102*   HbA1C: No results for input(s): HGBA1C in the last 72 hours. CBG: Recent Labs  Lab  01/24/18 1222 01/24/18 1727 01/24/18 2052 01/25/18 0832 01/25/18 1221  GLUCAP 141* 166* 212* 156* 139*   Lipid Profile: No results for input(s): CHOL, HDL, LDLCALC, TRIG, CHOLHDL, LDLDIRECT in the last 72 hours. Thyroid Function Tests: No results for input(s): TSH, T4TOTAL, FREET4, T3FREE, THYROIDAB in the last 72 hours. Anemia Panel: No results for input(s): VITAMINB12, FOLATE, FERRITIN, TIBC, IRON, RETICCTPCT in the last 72 hours.    Radiology Studies: I have reviewed all of the imaging during this hospital visit personally     Scheduled Meds: . amiodarone  200 mg Oral Daily  . barium      . barium      . bisacodyl  10 mg Oral Once  . doxazosin  4 mg Oral Daily  . ferrous sulfate  325 mg Oral BID  . fluticasone furoate-vilanterol  1 puff Inhalation Daily  . furosemide  40 mg Oral Daily  . insulin aspart  0-15 Units Subcutaneous TID WC  . levothyroxine  88 mcg Oral QAC breakfast  . mirabegron ER  25 mg Oral Daily  . pantoprazole  40 mg Oral BID  . pravastatin  20 mg Oral Daily  . sodium phosphate  1 enema Rectal Once  . sucralfate  1 g Oral TID WC & HS   Continuous Infusions:   LOS: 5 days        Rita Prom Gerome Apley, MD [facility] Pager [phone]

## 2018-01-25 NOTE — Care Management Important Message (Signed)
Important Message  Patient Details  Name: Patricia [name] MRN: [id number] Date of Birth: 1936/07/28   Medicare Important Message Given:  Yes    [name] 01/25/2018, 3:10 PM

## 2018-01-26 ENCOUNTER — Other Ambulatory Visit: Payer: Self-pay

## 2018-01-26 LAB — GLUCOSE, CAPILLARY
Glucose-Capillary: 155 mg/dL — ABNORMAL HIGH (ref 65–99)
Glucose-Capillary: 189 mg/dL — ABNORMAL HIGH (ref 65–99)

## 2018-01-26 NOTE — Discharge Summary (Signed)
Physician Discharge Summary  Patricia [name] FAO:[id number] DOB: 1936-06-21 DOA: 01/20/2018  PCP: [name], NP  Admit date: 01/20/2018 Discharge date: 01/26/2018  Admitted From: Home Disposition:  Home  Recommendations for Outpatient Follow-up and new medication changes:  1. Follow up with PCP in 1- week 2. Apixaban has been held due to occult GI bleeding 3. Follow up with Dr. Michail Sermon in 2 weeks, at the gastrointestinal clinic.   Home Health: no  Equipment/Devices: no    Discharge Condition: Stable CODE STATUS: full  Diet recommendation: Heart healthy and diabetic prudent  Brief/Interim Summary: 82 year old female who presented with back pain, chest pain and abdominal pain. She does have the significant past medical history for hypothyroidism, hypertension, type 2 diabetes mellitus, systolic heart failure, status post TAVR, atrial fibrillation on anticoagulation. Patient reported worsening symptoms over last several weeks prior to hospitalization, generalize weakness, ambulatory dysfunction, dizziness. Abdominal pain associated with diarrhea. Initial physical examination blood pressure 113/64, heart rate 76, respiratory rate 19, oxygen saturation 100%. Moist mucous membranes, lungs clear to auscultation bilaterally, heart S1-S2 present and rhythmic, abdomen soft nontender, no lower restaurant edema. Sodium 136, potassium 4.1, chloride 11, bicarbonate 21, glucose 224, BUN 101, creatinine 2.34, AST 21, AST 16, white count 15.4, hemoglobin 4.2, hematocrit 13.4, platelets 246. Chest film with left rotation, no infiltrates, EKG sinus rhythm, left axis deviation, left bundle branch block.  Patient was admitted to the hospital with the working diagnosis acute blood loss anemia due to suspected upper GI bleed.  1. Suspected upper gastrointestinal bleed, occult gastrointestinal bleed, complicated by acute blood loss anemia. Patient was admitted to the medical ward, she was placed on remote  telemetry monitor, she received 4 units of packed red blood cells transfusion and anticoagulation was held. Patient was given proton pump inhibitors for antiacid therapy. Her hemoglobin and hematocrit stabilized with a discharge hemoglobin of 8.4 and hematocrit 27.1. No further signs of bleeding. Further workup with upper endoscopy showed normal esophagus, erosive gastropathy, normal duodenum. CT of the abdomen with no source of bleeding. She underwent capsule endoscopy. Patient will follow-up as an outpatient with gastroenterology. Currently appropriate by mouth intake. Her constipation has resolved with laxatives.  2. Acute on chronic diastolic heart failure exacerbation. (12- 2018 left ventricular ejection fraction 60-65% with positive left ventricular hypertrophy. Patient developed signs of volume overload, she was diuresed with IV furosemide, negative fluid balance was achieved with improvement of her symptoms. Patient will continue 40 mg of furosemide daily.  3. Acute kidney injury on chronic kidney disease stage III. Kidney function improved after PRBC transfusion, tolerated well diuresis, discharge creatinine 1.45, potassium 4.1, serum bicarbonate 28. Continue 40 mg of furosemide daily. Close follow-up of kidney function and electrolytes.  4. Paroxysmal atrial fibrillation. Remained well controlled with amiodarone, anticoagulation has been held. Follow-up as an outpatient. To consider start low-dose aspirin if no further bleeding or major source of GI bleeding identified.   5. Hypertension. Pressure remained stable. Currently not on antihypertensive agents, besides furosemide. Discharge blood pressure 130 to 140 mmHg.   6. Type 2 diabetes mellitus. Patient was placed on insulin sliding scale for glucose coverage and monitoring. Capillary glucose remained well-controlled. Patient will resume glimepiride at discharge.  7. Dyslipidemia. Continue statin.  8. Hypothyroidism. Continue levothyroxine.        Discharge Diagnoses:  Principal Problem:   GI bleed Active Problems:   DM type 2 (diabetes mellitus, type 2) (HCC)   Hypothyroidism   PAF (paroxysmal atrial fibrillation) ([place])  Acute blood loss anemia   Acute renal failure superimposed on stage 3 chronic kidney disease ([place])    Discharge Instructions   Allergies as of 01/26/2018      Reactions   Codeine Other (See Comments)   "Spaces out" the patient and she cannot walk well   Vesicare [solifenacin] Itching      Medication List    STOP taking these medications   apixaban 5 MG Tabs tablet Commonly known as:  ELIQUIS     TAKE these medications   acetaminophen 500 MG tablet Commonly known as:  TYLENOL Take 500 mg by mouth every 6 (six) hours as needed for mild pain.   albuterol (2.5 MG/3ML) 0.083% nebulizer solution Commonly known as:  PROVENTIL USE 1 VIAL IN NEBULIZER EVERY 4 HOURS AND AS NEEDED. Generic: VENTOLIN What changed:  See the new instructions.   albuterol 108 (90 Base) MCG/ACT inhaler Commonly known as:  VENTOLIN HFA Inhale 2 puffs into the lungs every 6 (six) hours as needed for wheezing or shortness of breath. What changed:  Another medication with the same name was changed. Make sure you understand how and when to take each.   ALLERGY EYE DROPS OP Place 1 drop into both eyes 3 (three) times daily as needed (allergies).   cetirizine 10 MG tablet Commonly known as:  ZYRTEC Take 10 mg by mouth daily as needed for allergies.   cholecalciferol 1000 units tablet Commonly known as:  VITAMIN D Take 1,000 Units by mouth at bedtime.   doxazosin 4 MG tablet Commonly known as:  CARDURA Take 1 tablet (4 mg total) by mouth daily.   ferrous sulfate 325 (65 FE) MG EC tablet Take 325 mg by mouth 2 (two) times daily.   fluticasone furoate-vilanterol 100-25 MCG/INH Aepb Commonly known as:  BREO ELLIPTA Inhale into the lungs once daily What changed:    how much to take  how to take this  when  to take this  additional instructions   furosemide 40 MG tablet Commonly known as:  LASIX Take 1 tablet (40 mg total) by mouth daily.   glimepiride 2 MG tablet Commonly known as:  AMARYL Take one tablet by mouth once daily at breakfast to control glucose What changed:    how much to take  how to take this  when to take this  additional instructions   levothyroxine 88 MCG tablet Commonly known as:  SYNTHROID, LEVOTHROID Take 1 tablet (88 mcg total) by mouth daily.   mirabegron ER 25 MG Tb24 tablet Commonly known as:  MYRBETRIQ One daily to help bladder control What changed:    how much to take  how to take this  when to take this  additional instructions   PACERONE 200 MG tablet Generic drug:  amiodarone Take 200 mg by mouth daily.   pantoprazole 40 MG tablet Commonly known as:  PROTONIX TAKE 1 TABLET BY MOUTH TWO  TIMES DAILY What changed:    how much to take  how to take this  when to take this   potassium gluconate 595 (99 K) MG Tabs tablet Take 595 mg by mouth See admin instructions. TAKES ONLY WHEN USING FUROSEMIDE   pravastatin 20 MG tablet Commonly known as:  PRAVACHOL Take 1 tablet (20 mg total) by mouth daily.   triamcinolone 0.025 % cream Commonly known as:  KENALOG Apply 1 application topically daily as needed (psoriasis).       Allergies  Allergen Reactions  . Codeine Other (See Comments)    "  Spaces out" the patient and she cannot walk well  . Vesicare [Solifenacin] Itching    Consultations:  Gastrointestinal    Procedures/Studies: Dg Chest 2 View  Result Date: 01/20/2018 CLINICAL DATA:  Weakness EXAM: CHEST - 2 VIEW COMPARISON:  02/18/2017 plain film examination, 10/08/2017 CT of the chest. FINDINGS: Cardiac shadow remains enlarged. Findings of prior TAVR are seen. The lungs are well aerated bilaterally without focal infiltrate or sizable effusion. Chronic blunting of the right costophrenic angle is noted laterally. The  overall appearance is similar to that seen on the prior exam. IMPRESSION: No active cardiopulmonary disease. Electronically Signed   By: Inez Catalina M.D.   On: 01/20/2018 15:54   Ct Entero Abd/pelvis W Contast  Result Date: 01/25/2018 CLINICAL DATA:  Occult GI bleed. Capsule endoscopy did not show any active bleeding. Area of thickened small-bowel folds in mid to distal small bowel of unclear significance. Negative upper endoscopy. EXAM: CT ABDOMEN AND PELVIS WITH CONTRAST (ENTEROGRAPHY) TECHNIQUE: Multidetector CT of the abdomen and pelvis during bolus administration of intravenous contrast. Negative oral contrast was given. CONTRAST:  152m ISOVUE-300 IOPAMIDOL (ISOVUE-300) INJECTION 61% COMPARISON:  CT 10/08/2017.  CTA 09/24/2016. FINDINGS: Lower chest: Trace bilateral pleural effusions with stable bibasilar atelectasis or scarring. The heart is enlarged. There is atherosclerosis of the aorta and coronary arteries. Mitral annular calcifications. Hepatobiliary: Grossly stable hepatic cysts. No suspicious hepatic findings. No significant biliary dilatation status post cholecystectomy. Pancreas: Unremarkable. No pancreatic ductal dilatation or surrounding inflammatory changes. Spleen: Normal in size without focal abnormality. Adrenals/Urinary Tract: Both adrenal glands appear normal. There are several small low-density renal lesions bilaterally, similar to previous studies and likely all cysts. No evidence of urinary tract calculus or hydronephrosis. The bladder appears normal. Stomach/Bowel: The stomach and proximal small bowel are adequately distended with the enteric contrast. No bowel wall thickening or surrounding inflammation identified. The appendix appears normal. There is stool throughout the colon. Endoscopy capsule in the midtransverse colon. No mucosal lesion or abnormal enhancement identified. Vascular/Lymphatic: There are no enlarged abdominal or pelvic lymph nodes. Diffuse aortic and branch vessel  atherosclerosis. Reproductive: Hysterectomy.  No adnexal mass. Other: Stable tiny umbilical hernia containing only fat. The left lateral abdominal wall is thinned and protuberant without focal hernia. No ascites. Musculoskeletal: No acute or significant osseous findings. Multilevel lumbar spondylosis. IMPRESSION: 1. No cause for GI bleeding identified. No bowel wall thickening seen. The appendix appears normal. 2. Endoscopy capsule in the midtransverse colon. 3. Diffuse Aortic Atherosclerosis (ICD10-I70.0). 4. Hepatic and renal cysts, grossly stable. Electronically Signed   By: WRichardean SaleM.D.   On: 01/25/2018 11:18   [place] Site Rite  Result Date: 01/20/2018 If Site Rite image not attached, placement could not be confirmed due to current cardiac rhythm.      Subjective: Patient is feeling well, no dyspnea, no chest pain, no nausea or vomiting, tolerating po well.   Discharge Exam: Vitals:   01/26/18 0502 01/26/18 0749  BP: (!) 133/46   Pulse: 65   Resp: 20   Temp: 98.3 F (36.8 C)   SpO2: 100% 99%   Vitals:   01/25/18 1524 01/25/18 2104 01/26/18 0502 01/26/18 0749  BP: (!) 143/50 (!) 144/49 (!) 133/46   Pulse: 61 70 65   Resp: _0 Temp: 98.3 F (36.8 C) 98.3 F (36.8 C) 98.3 F (36.8 C)   TempSrc: Oral Oral Oral   SpO2: 100% 100% 100% 99%  Weight:   106.4 kg (234 lb  8 oz)   Height:        General: Not in pain or dyspnea,  Neurology: Awake and alert, non focal  E ENT: mild pallor, no icterus, oral mucosa moist Cardiovascular: No JVD. S1-S2 present, rhythmic, no gallops, rubs, or murmurs. No lower extremity edema. Pulmonary: vesicular breath sounds bilaterally, adequate air movement, no wheezing, rhonchi or rales. Gastrointestinal. Abdomen protuberant, no organomegaly, non tender, no rebound or guarding Skin. No rashes Musculoskeletal: no joint deformities   The results of significant diagnostics from this hospitalization (including imaging, microbiology,  ancillary and laboratory) are listed below for reference.     Microbiology: No results found for this or any previous visit (from the past 240 hour(s)).   Labs: BNP (last 3 results) Recent Labs    11/12/17 1828 11/18/17 0830  BNP 839.5* 564.3*   Basic Metabolic Panel: Recent Labs  Lab 01/20/18 1503 01/20/18 1513 01/21/18 0543 01/22/18 1030 01/23/18 0741 01/25/18 0446  NA 136 135 139 142 139 139  K 4.1 4.1 3.9 3.9 4.0 4.1  CL 101 101 104 111 107 104  CO2 21*  --  _0 GLUCOSE 224* 215* 170* 148* 159* 160*  BUN 101* 95* 101* 64* 46* 34*  CREATININE 2.35* 2.40* 2.31* 1.78* 1.68* 1.45*  CALCIUM 8.3*  --  7.6* 8.0* 8.0* 8.5*   Liver Function Tests: Recent Labs  Lab 01/20/18 1503  AST 21  ALT 16  ALKPHOS 40  BILITOT 0.5  PROT 5.8*  ALBUMIN 3.0*   Recent Labs  Lab 01/20/18 1503  LIPASE 29   No results for input(s): AMMONIA in the last 168 hours. CBC: Recent Labs  Lab 01/20/18 1503  01/21/18 0543 01/22/18 1030 01/23/18 0741 01/24/18 1410 01/25/18 0446  WBC 15.4*  --  16.0* 9.7 8.3 7.6 7.5  NEUTROABS 12.3*  --   --   --   --   --  5.8  HGB 4.2*   < > 6.8* 7.7* 8.8* 8.6* 8.4*  HCT 13.4*   < > 20.9* 23.8* 26.8* 27.3* 27.1*  MCV 93.1  --  90.5 91.9 93.7 94.5 96.1  PLT 246  --  172 131* 130* 135* 135*   < > = values in this interval not displayed.   Cardiac Enzymes: No results for input(s): CKTOTAL, CKMB, CKMBINDEX, TROPONINI in the last 168 hours. BNP: Invalid input(s): POCBNP CBG: Recent Labs  Lab 01/25/18 0832 01/25/18 1221 01/25/18 1725 01/25/18 2310 01/26/18 0759  GLUCAP 156* 139* 174* 135* 155*   D-Dimer No results for input(s): DDIMER in the last 72 hours. Hgb A1c No results for input(s): HGBA1C in the last 72 hours. Lipid Profile No results for input(s): CHOL, HDL, LDLCALC, TRIG, CHOLHDL, LDLDIRECT in the last 72 hours. Thyroid function studies No results for input(s): TSH, T4TOTAL, T3FREE, THYROIDAB in the last 72  hours.  Invalid input(s): FREET3 Anemia work up No results for input(s): VITAMINB12, FOLATE, FERRITIN, TIBC, IRON, RETICCTPCT in the last 72 hours. Urinalysis    Component Value Date/Time   COLORURINE YELLOW 10/10/2016 1409   APPEARANCEUR HAZY (A) 10/10/2016 1409   LABSPEC 1.020 10/10/2016 1409   PHURINE 5.0 10/10/2016 1409   GLUCOSEU NEGATIVE 10/10/2016 1409   HGBUR NEGATIVE 10/10/2016 1409   BILIRUBINUR NEGATIVE 10/10/2016 1409   BILIRUBINUR negative 07/18/2015 1337   BILIRUBINUR small 10/07/2012 1544   KETONESUR NEGATIVE 10/10/2016 1409   PROTEINUR 30 (A) 10/10/2016 1409   UROBILINOGEN 0.2 07/18/2015 1337   NITRITE NEGATIVE 10/10/2016 1409  LEUKOCYTESUR NEGATIVE 10/10/2016 1409   Sepsis Labs Invalid input(s): PROCALCITONIN,  WBC,  LACTICIDVEN Microbiology No results found for this or any previous visit (from the past 240 hour(s)).   Time coordinating discharge: 45 minutes  SIGNED:   Tawni Millers, MD  [facility] 01/26/2018, 10:03 AM Pager [phone]  If 7PM-7AM, please contact night-coverage [email or web address] Password [email or web address]

## 2018-01-26 NOTE — Progress Notes (Signed)
Pt given discharge instructions, prescriptions, and care notes. Pt verbalized understanding AEB no further questions or concerns at this time. IV was discontinued, no redness, pain, or swelling noted at this time. Pt left the floor via wheelchair with staff in stable condition.

## 2018-01-27 ENCOUNTER — Telehealth (HOSPITAL_COMMUNITY): Payer: Self-pay

## 2018-01-27 ENCOUNTER — Telehealth: Payer: Self-pay

## 2018-01-27 NOTE — Telephone Encounter (Signed)
Transition Care Management Follow-Up Telephone Call   Date discharged and where: [facility] on 01/26/2018  How have you been since you were released from the hospital? Still weak, low energy. No more pain and having regular bowel movement  Any patient concerns? None  Items Reviewed:   Meds: Y  Allergies: Y  Dietary Changes Reviewed: Y  Functional Questionnaire:  Independent-I Dependent-D  ADLs:   Dressing- I    Eating- I   Maintaining continence- I   Transferring- I w/ walker or cane   Transportation- D   Meal Prep- I w/ assist   Managing Meds- I  Confirmed importance and Date/Time of follow-up visits scheduled: Yes, Patricia Mustache, NP 02/02/2018 @ 3:15pm   Confirmed with patient if condition worsens to call PCP or go to the Emergency Dept. Patient was given office number and encouraged to call back with questions or concerns: yes

## 2018-01-27 NOTE — Telephone Encounter (Signed)
Patient called and stated she was hospitalized for 6 days, was just d/c with GI Bleed. Patient stated they still can't figure out what is causing the bleeding. Patient stated she has an appt with the GI dr in a couple of days. CXL orientation and passed referral to RN to follow and for further review.

## 2018-01-28 ENCOUNTER — Encounter (HOSPITAL_COMMUNITY): Payer: Self-pay | Admitting: Gastroenterology

## 2018-01-28 NOTE — Anesthesia Postprocedure Evaluation (Signed)
Anesthesia Post Note  Patient: Patricia [name]  Procedure(s) Performed: ESOPHAGOGASTRODUODENOSCOPY (EGD) WITH PROPOFOL (Left )     Patient location during evaluation: Endoscopy Anesthesia Type: MAC Level of consciousness: awake and patient cooperative Pain management: pain level controlled Vital Signs Assessment: post-procedure vital signs reviewed and stable Respiratory status: spontaneous breathing, nonlabored ventilation, respiratory function stable and patient connected to nasal cannula oxygen Cardiovascular status: stable and blood pressure returned to baseline Postop Assessment: no apparent nausea or vomiting Anesthetic complications: no    Last Vitals:  Vitals:   01/26/18 0502 01/26/18 0749  BP: (!) 133/46   Pulse: 65   Resp: 20   Temp: 36.8 C   SpO2: 100% 99%    Last Pain:  Vitals:   01/26/18 1000  TempSrc:   PainSc: 0-No pain                 Bannie Lobban

## 2018-01-28 NOTE — Anesthesia Preprocedure Evaluation (Signed)
Anesthesia Evaluation  Patient identified by MRN, date of birth, ID band Patient awake    Reviewed: Allergy & Precautions, NPO status , Patient's Chart, lab work & pertinent test results  History of Anesthesia Complications (+) PONV and history of anesthetic complications  Airway Mallampati: II  TM Distance: >3 FB Neck ROM: Full    Dental  (+) Partial Upper, Partial Lower   Pulmonary shortness of breath, asthma , COPD,  COPD inhaler, former smoker,    breath sounds clear to auscultation       Cardiovascular hypertension, +CHF  + dysrhythmias Atrial Fibrillation + Valvular Problems/Murmurs  Rhythm:Irregular Rate:Normal     Neuro/Psych  Neuromuscular disease negative psych ROS   GI/Hepatic Neg liver ROS, GERD  Medicated,  Endo/Other  diabetesHypothyroidism Morbid obesity  Renal/GU Renal InsufficiencyRenal disease     Musculoskeletal  (+) Arthritis ,   Abdominal   Peds  Hematology  (+) anemia ,   Anesthesia Other Findings   Reproductive/Obstetrics                             Anesthesia Physical Anesthesia Plan  ASA: III  Anesthesia Plan: MAC   Post-op Pain Management:    Induction: Intravenous  PONV Risk Score and Plan: 3 and Treatment may vary due to age or medical condition  Airway Management Planned: Nasal Cannula  Additional Equipment:   Intra-op Plan:   Post-operative Plan:   Informed Consent: I have reviewed the patients History and Physical, chart, labs and discussed the procedure including the risks, benefits and alternatives for the proposed anesthesia with the patient or authorized representative who has indicated his/her understanding and acceptance.   Dental advisory given  Plan Discussed with: CRNA and Surgeon  Anesthesia Plan Comments:         Anesthesia Quick Evaluation

## 2018-01-29 ENCOUNTER — Ambulatory Visit (HOSPITAL_COMMUNITY): Payer: Medicare Other

## 2018-02-02 ENCOUNTER — Encounter: Payer: Self-pay | Admitting: Nurse Practitioner

## 2018-02-04 ENCOUNTER — Telehealth (HOSPITAL_COMMUNITY): Payer: Self-pay

## 2018-02-05 ENCOUNTER — Other Ambulatory Visit: Payer: Self-pay | Admitting: Nurse Practitioner

## 2018-02-05 DIAGNOSIS — I5032 Chronic diastolic (congestive) heart failure: Secondary | ICD-10-CM

## 2018-02-07 NOTE — Progress Notes (Signed)
Cardiology Office Note   Date:  02/09/2018   ID:  Patricia [name], DOB 14-Apr-1936, MRN [id number]  PCP:  [name], NP  Cardiologist:   Brenon Antosh [place], MD   Chief Complaint  Patient presents with  . Shortness of Breath    pt states feeling SOB wit or wothout activity       History of Present Illness: Patricia [name] is a 82 y.o. female who is seen as a work in for follow up Afib.  She has a history of HTN, obesity, DM, HL, and anemia. She was admitted to the hospital in December 2016 with increased dyspnea. She was found to be in atrial fibrillation with RVR. She had pulmonary edema. Hgb was 10. Echo showed EF 55-60% with moderate to severe AS. She was anticoagulated with Xarelto and started on amiodarone. She had TEE guided DCCV with return to NSR. She was seen back in the office on 11/20/15 and was still in NSR.  On 12/18/15 she underwent right shoulder arthroplasty. Post op Hgb was 9.6. It subsequently dropped to 7.1. She was transfused. She has been on iron. Hgb improved to 10. She did have EGD and colonoscopy with both gastric and colonic polyps. No bleeding seen.   On follow up in October 2017 she noted symptoms of increased dyspnea.  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 Mild pulmonary HTN and elevated filling pressures. She was admitted in November with acute CHF and was diuresed. She was evaluated in valve clinic and underwent transfemoral TAVR on 10/14/16. Post op course was uncomplicated. Repeat Echo in January  Showed higher than expected AV prosthesis gradient felt to be related to vigorous LV systolic function and Valve/ patient mismatch. She has been on  Eliquis. She has noted moderate improvement in her breathing.   She continues to have issues with COPD and bronchitis- followed by primary care and pulmonary.  In March 2018 she was noted to have progressive anemia with dark stools. Hgb down to 7.4. She was  transfused and EGD showed multiple gastric polyps and some oozing AVMs that were cauterized. Once again she was admitted in May with worsening anemia. 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 Transfused 1 units PRBCs. Plan for outpatient capsule endoscopy.   She was seen January 9 with complaints of cough that did not improve with stopping ACEi. Some pulmonary crackles noted at the time. Pulse rate 70 then. CT chest in December showed emphysema and periapical scarring but no acute infiltrates. Seen later in January with increased SOB and rapid HR. She was found to be in Afib. She was admitted and amiodarone dose increased she underwent successful DCCV. Continued on Eliquis.   She was admitted March 27-April 2 with abdominal pain and severe weakness. Hgb down to 4.2. Creatinine up to 2.34. Patient was admitted to the medical ward, she received 4 units of packed red blood cells transfusion and anticoagulation was held. Patient was given proton pump inhibitors for antiacid therapy. Her hemoglobin and hematocrit stabilized with a discharge hemoglobin of 8.4 and hematocrit 27.1. No further signs of bleeding. Further workup with upper endoscopy showed normal esophagus, erosive gastropathy, normal duodenum. CT of the abdomen with no source of bleeding. She underwent capsule endoscopy. This did not show any bleeding source.  She did require IV lasix for volume overload. Creatinine improved to 1.45.   On follow up today she is feeling more  SOB. Notes increased swelling in ankles and increased her lasix dose to 80 mg 3 days ago. No chest pain. No palpitations or increased HR. No obvious bleeding.    Past Medical History:  Diagnosis Date  . Allergy   . Anemia, unspecified   . Arthritis   . Asthma   . Atrial fibrillation status post cardioversion [name]) 09/2015   s/p TEE/DCCV>>SR on amio  . Benign neoplasm of colon   . Carpal tunnel syndrome   . Cellulitis and abscess of finger, unspecified   .  Cerumen impaction   . Cervicalgia   . CHF (congestive heart failure) ([place])   . Chronic airway obstruction, not elsewhere classified   . Chronic anticoagulation 09/2015   Eliquis  . Diarrhea   . Diffuse cystic mastopathy   . Edema   . Encounter for long-term (current) use of other medications   . GERD (gastroesophageal reflux disease)   . Heart murmur   . Lumbago   . Neck pain 05/23/2015  . Obesity, unspecified   . Other and unspecified hyperlipidemia   . Other psoriasis   . Pain in joint, lower leg   . PONV (postoperative nausea and vomiting)   . Primary localized osteoarthrosis of right shoulder 12/18/2015  . 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 Scoliosis (and kyphoscoliosis), idiopathic   . Shortness of breath dyspnea    with exertion  . Type II or unspecified type diabetes mellitus without mention of complication, uncontrolled   . Unspecified essential hypertension   . Unspecified hemorrhoids without mention of complication   . Unspecified hypothyroidism   . Urge incontinence     Past Surgical History:  Procedure Laterality Date  . ABDOMINAL HYSTERECTOMY  1987   complete  . BREAST BIOPSY Right   . BREAST EXCISIONAL BIOPSY Left 2011  . BREAST EXCISIONAL BIOPSY Right    x2  . BREAST SURGERY     right breast 3 surgeries [phone]  . CARDIAC CATHETERIZATION N/A 09/02/2016   Procedure: Right/Left Heart Cath and Coronary Angiography;  Surgeon: Mitcheal Sweetin M Martinique, MD;  Location: [place] [facility] LAB;  Service: Cardiovascular;  Laterality: N/A;  . CARDIOVERSION N/A 10/19/2015   Procedure: CARDIOVERSION;  Surgeon: Lelon Perla, MD;  Location: [facility] ENDOSCOPY;  Service: Cardiovascular;  Laterality: N/A;  . CARDIOVERSION N/A 11/13/2017   Procedure: CARDIOVERSION;  Surgeon: Jerline Pain, MD;  Location: [place];  Service: Cardiovascular;  Laterality:  N/A;  . [place]  . COLON SURGERY     2004,2007,2013.  3 times colon surgieres  . ESOPHAGOGASTRODUODENOSCOPY (EGD) WITH PROPOFOL Left 03/13/2017   Procedure: ESOPHAGOGASTRODUODENOSCOPY (EGD) WITH PROPOFOL;  Surgeon: Ronnette Juniper, MD;  Location: [place];  Service: Gastroenterology;  Laterality: Left;  . ESOPHAGOGASTRODUODENOSCOPY (EGD) WITH PROPOFOL Left 01/22/2018   Procedure: ESOPHAGOGASTRODUODENOSCOPY (EGD) WITH PROPOFOL;  Surgeon: Arta Silence, MD;  Location[facility] ENDOSCOPY;  Service: Endoscopy;  Laterality: Left;  . EXCISE LE MANDIBULAR LYMPH NODE T     DR C. NEWMAN  . FOOT SURGERY  1989  . GIVENS CAPSULE STUDY Left 01/23/2018   Procedure: GIVENS CAPSULE STUDY;  Surgeon: Wilford Corner, MD;  Location: [facility] ENDOSCOPY;  Service: Endoscopy;  Laterality: Left;  . LEFT SHOULDER ARTHROSCOPY    . 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 NEUROPLASTY / TRANSPOSITION MEDIAN NERVE AT CARPAL TUNNEL BILATERAL  05/2003  . TEE WITHOUT CARDIOVERSION N/A  10/19/2015   Procedure: Transesophageal Echocardiogram (TEE) ;  Surgeon: Lelon Perla, MD;  Location: [facility] ENDOSCOPY;  Service: Cardiovascular;  Laterality: N/A;  . TEE WITHOUT CARDIOVERSION N/A 10/14/2016   Procedure: TRANSESOPHAGEAL ECHOCARDIOGRAM (TEE);  Surgeon: Sherren Mocha, MD;  Location: [facility];  Service: Open Heart Surgery;  Laterality: N/A;  . TOTAL SHOULDER ARTHROPLASTY Right 12/18/2015   Procedure: TOTAL SHOULDER ARTHROPLASTY;  Surgeon: [name], MD;  Location: [facility];  Service: Orthopedics;  Laterality: Right;  . TRANSCATHETER AORTIC VALVE REPLACEMENT, TRANSFEMORAL N/A 10/14/2016   Procedure: TRANSCATHETER AORTIC VALVE REPLACEMENT, TRANSFEMORAL;  Surgeon: Sherren Mocha, MD;  Location: [place];  Service: Open Heart Surgery;  Laterality: N/A;  . TRIGGER FINGER RELEASE Left   . VAGINAL CYST REMOVED  1967     Current Outpatient Medications  Medication Sig Dispense Refill  . acetaminophen (TYLENOL) 500 MG tablet  Take 500 mg by mouth every 6 (six) hours as needed for mild pain.    Marland Kitchen albuterol (PROVENTIL) (2.5 MG/3ML) 0.083% nebulizer solution USE 1 VIAL IN NEBULIZER EVERY 4 HOURS AND AS NEEDED. Generic: VENTOLIN (Patient taking differently: use 1 vial in nebulizer every 4 hours and as needed for wheezing) 60 vial 10  . albuterol (VENTOLIN HFA) 108 (90 Base) MCG/ACT inhaler Inhale 2 puffs into the lungs every 6 (six) hours as needed for wheezing or shortness of breath. 1 Inhaler 11  . amiodarone (PACERONE) 200 MG tablet Take 200 mg by mouth daily.    . cetirizine (ZYRTEC) 10 MG tablet Take 10 mg by mouth daily as needed for allergies.     . cholecalciferol (VITAMIN D) 1000 units tablet Take 1,000 Units by mouth at bedtime.     Marland Kitchen doxazosin (CARDURA) 4 MG tablet Take 1 tablet (4 mg total) by mouth daily. 30 tablet 0  . ferrous sulfate 325 (65 FE) MG EC tablet Take 325 mg by mouth 2 (two) times daily.    . fluticasone furoate-vilanterol (BREO ELLIPTA) 100-25 MCG/INH AEPB Inhale into the lungs once daily (Patient taking differently: Inhale 1 puff into the lungs daily. ) 1 each 5  . furosemide (LASIX) 40 MG tablet Take 2 tablets (80 mg total) by mouth daily. 90 tablet 1  . glimepiride (AMARYL) 2 MG tablet Take one tablet by mouth once daily at breakfast to control glucose (Patient taking differently: Take 2 mg by mouth daily. ) 90 tablet 1  . Ketotifen Fumarate (ALLERGY EYE DROPS OP) Place 1 drop into both eyes 3 (three) times daily as needed (allergies).     Marland Kitchen levothyroxine (SYNTHROID, LEVOTHROID) 88 MCG tablet Take 1 tablet (88 mcg total) by mouth daily. 90 tablet 3  . mirabegron ER (MYRBETRIQ) 25 MG TB24 tablet One daily to help bladder control (Patient taking differently: Take 25 mg by mouth daily. ) 90 tablet 5  . pantoprazole (PROTONIX) 40 MG tablet TAKE 1 TABLET BY MOUTH TWO  TIMES DAILY (Patient taking differently: 65m by mouth twice daily) 180 tablet 1  . potassium gluconate 595 (99 K) MG TABS tablet Take  595 mg by mouth See admin instructions. TAKES ONLY WHEN USING FUROSEMIDE    . pravastatin (PRAVACHOL) 20 MG tablet Take 1 tablet (20 mg total) by mouth daily. 90 tablet 1  . triamcinolone (KENALOG) 0.025 % cream Apply 1 application topically daily as needed (psoriasis). 30 g 0   No current facility-administered medications for this visit.     Allergies:   Codeine and Vesicare [solifenacin]    Social History:  The  patient  reports that she quit smoking about 28 years ago. She has a 40.00 pack-year smoking history. She has never used smokeless tobacco. She reports that she does not drink alcohol or use drugs.   Family History:  The patient's family history includes Arthritis in her sister; Asthma in her sister; Diabetes in her father; Heart disease in her brother and father; Liver cancer in her sister; Stroke in her father.    ROS:  Please see the history of present illness.   Otherwise, review of systems are positive for none.   All other systems are reviewed and negative.    PHYSICAL EXAM: VS:  BP (!) 127/57   Pulse 81   Ht _0  (1.6 m)   Wt 223 lb (101.2 kg)   SpO2 (!) 86%   BMI 39.50 kg/m  , BMI Body mass index is 39.5 kg/m. GENERAL:  Pale, obese WF in NAD HEENT:  PERRL, EOMI, sclera are clear. Oropharynx is clear. NECK:  No jugular venous distention, carotid upstroke brisk and symmetric, no bruits, no thyromegaly or adenopathy LUNGS:  Clear to auscultation bilaterally CHEST:  Unremarkable HEART:  RRR,  PMI not displaced or sustained,S1 and S2 within normal limits, no S3, no S4: no clicks, no rubs, gr 2/6 harsh systolic murmur RUSB.  ABD:  Soft, nontender. BS +, no masses or bruits. No hepatomegaly, no splenomegaly EXT:  2 + pulses throughout, 1+ edema, no cyanosis no clubbing SKIN:  Warm and dry.  No rashes NEURO:  Alert and oriented x 3. Cranial nerves II through XII intact. PSYCH:  Cognitively intact     EKG:  EKG is not ordered today.    Recent Labs: Lab Results    Component Value Date   WBC 7.5 01/25/2018   HGB 8.4 (L) 01/25/2018   HCT 27.1 (L) 01/25/2018   PLT 135 (L) 01/25/2018   GLUCOSE 160 (H) 01/25/2018   CHOL 158 06/11/2017   TRIG 97 06/11/2017   HDL 63 06/11/2017   LDLCALC 76 06/11/2017   ALT 16 01/20/2018   AST 21 01/20/2018   NA 139 01/25/2018   K 4.1 01/25/2018   CL 104 01/25/2018   CREATININE 1.45 (H) 01/25/2018   BUN 34 (H) 01/25/2018   CO2 28 01/25/2018   TSH 5.553 (H) 01/21/2018   INR 1.44 01/20/2018   HGBA1C 7.3 (H) 12/24/2017   MICROALBUR 6.3 06/11/2017      Lipid Panel    Component Value Date/Time   CHOL 158 06/11/2017 0832   CHOL 182 04/28/2016 0940   TRIG 97 06/11/2017 0832   HDL 63 06/11/2017 0832   HDL 61 04/28/2016 0940   CHOLHDL 2.5 06/11/2017 0832   VLDL 19 06/11/2017 0832   LDLCALC 76 06/11/2017 0832   LDLCALC 86 04/28/2016 0940      Wt Readings from Last 3 Encounters:  02/09/18 223 lb (101.2 kg)  01/26/18 234 lb 8 oz (106.4 kg)  12/29/17 226 lb (102.5 kg)      Other studies Reviewed: Additional studies/ records that were reviewed today include:   Echo 10/14/17: Study Conclusions  - Left ventricle: The cavity size was normal. Wall thickness was   increased in a pattern of moderate LVH. Systolic function was   normal. The estimated ejection fraction was in the range of 60%   to 65%. Wall motion was normal; there were no regional wall   motion abnormalities. Diastolic dysfunction with elevated LV   filling pressure. - Aortic valve: s/p TAVR. No  obstruction or paravalvular leak. Mean   gradient (S): 24 mm Hg. Peak gradient (S): 47 mm Hg. Valve area   (VTI): 1.19 cm^2. Valve area (Vmax): 1.09 cm^2. Valve area   (Vmean): 1.07 cm^2. - Mitral valve: Calcified annulus. Mildly thickened leaflets . Mild   stenosis. There was moderate regurgitation. - Left atrium: Severely dilated. - Right atrium: Mildly dilated. - Tricuspid valve: There was mild regurgitation. - Pulmonary arteries: PA peak  pressure: 51 mm Hg (S). - Inferior vena cava: The vessel was normal in size. The   respirophasic diameter changes were in the normal range (>= 50%),   consistent with normal central venous pressure.  Impressions:  - Compared to a prior study in 10/2016, the TAVR valve appears   stable.  Extensive hospital records reviewed as noted above.   ASSESSMENT AND PLAN:  1.  Atrial fibrillation. S/p DCCV in December 2016 and again January 2019. [place] Vasc score of 6. Now on higher dose of amiodarone 200 mg daily. Anticoagulation held due to recent GI bleed. Based on the fact she has 3 GI bleeding episodes in the past year I do not think she is a candidate for long term anticoagulation. One consideration would be a LA occlusive device like Watchmans but even this would entail at least a few weeks of anticoagulation or DAPT. Given recent GI bleed we cannot recommend this now.   2.  Severe Aortic stenosis. Echo in July 2017 showed progression with velocity > 4 cm/sec and mean gradient of 40 mm Hg. Now s/p TAVR in December 2017. Follow up Echo 12/18 with higher than expected gradient (mean gradient of 29 mm Hg) due to patient/ prosthetic mismatch with small annuls size and vigorous LV function. Exam is stable.  3. Anemia Fe deficient with GI blood loss secondary to AVMS and erosive gastropathy. GI work up as noted. Risk of recurrent bleeding is high. Stay off anticoagulation and antiplatelet for now.   4. Chronic kidney disease stage 3.   5. S/p right shoulder arthroplasty.   6. Acute on Chronic diastolic CHF. Increased due recent severe anemia and blood product transfusion.  Would continue higher dose of lasix 80 mg daily.  7. HTN- BP is controlled.  8. DM type 2.   9. COPD.   I will follow up in 3 months.   Signed, Nayzeth Altman Martin[place], MD  02/09/2018 10:49 AM    [facility] [address], [place], [place], [address] Phone [phone], Fax [phone]

## 2018-02-09 ENCOUNTER — Ambulatory Visit (INDEPENDENT_AMBULATORY_CARE_PROVIDER_SITE_OTHER): Payer: Medicare Other | Admitting: Cardiology

## 2018-02-09 ENCOUNTER — Encounter: Payer: Self-pay | Admitting: Cardiology

## 2018-02-09 VITALS — BP 127/57 | HR 81 | Ht 63.0 in | Wt 223.0 lb

## 2018-02-09 DIAGNOSIS — N189 Chronic kidney disease, unspecified: Secondary | ICD-10-CM | POA: Diagnosis not present

## 2018-02-09 DIAGNOSIS — Z952 Presence of prosthetic heart valve: Secondary | ICD-10-CM | POA: Diagnosis not present

## 2018-02-09 DIAGNOSIS — D649 Anemia, unspecified: Secondary | ICD-10-CM | POA: Diagnosis not present

## 2018-02-09 DIAGNOSIS — I5033 Acute on chronic diastolic (congestive) heart failure: Secondary | ICD-10-CM | POA: Diagnosis not present

## 2018-02-09 DIAGNOSIS — I5032 Chronic diastolic (congestive) heart failure: Secondary | ICD-10-CM | POA: Diagnosis not present

## 2018-02-09 DIAGNOSIS — K31811 Angiodysplasia of stomach and duodenum with bleeding: Secondary | ICD-10-CM

## 2018-02-09 DIAGNOSIS — I480 Paroxysmal atrial fibrillation: Secondary | ICD-10-CM | POA: Diagnosis not present

## 2018-02-09 MED ORDER — FUROSEMIDE 40 MG PO TABS: 80.0000 mg | ORAL_TABLET | Freq: Every day | ORAL | 1 refills | Status: DC

## 2018-02-09 NOTE — Patient Instructions (Signed)
Stay off any ASA, Plavix or anticoagulation  Increase lasix to 80 mg daily  I will see you in 3 months.

## 2018-02-10 DIAGNOSIS — D500 Iron deficiency anemia secondary to blood loss (chronic): Secondary | ICD-10-CM | POA: Diagnosis not present

## 2018-02-13 ENCOUNTER — Other Ambulatory Visit: Payer: Self-pay | Admitting: Internal Medicine

## 2018-02-13 DIAGNOSIS — E119 Type 2 diabetes mellitus without complications: Secondary | ICD-10-CM

## 2018-02-18 ENCOUNTER — Other Ambulatory Visit (HOSPITAL_COMMUNITY): Payer: Self-pay | Admitting: *Deleted

## 2018-02-19 ENCOUNTER — Ambulatory Visit (HOSPITAL_COMMUNITY)
Admission: RE | Admit: 2018-02-19 | Discharge: 2018-02-19 | Disposition: A | Discharge status: Home / Self Care | Payer: Medicare Other | Source: Ambulatory Visit | Attending: Gastroenterology | Admitting: Gastroenterology

## 2018-02-19 DIAGNOSIS — D500 Iron deficiency anemia secondary to blood loss (chronic): Secondary | ICD-10-CM | POA: Diagnosis not present

## 2018-02-19 MED ORDER — SODIUM CHLORIDE 0.9 % IV SOLN
200.0000 mg | Freq: Once | INTRAVENOUS | Status: AC
  Administered 2018-02-19: 200 mg via INTRAVENOUS
  Filled 2018-02-19: qty 10

## 2018-02-19 MED ORDER — SODIUM CHLORIDE 0.9 % IV SOLN: 125.0000 mg | INTRAVENOUS | Status: DC

## 2018-02-19 NOTE — Discharge Instructions (Signed)
Iron Sucrose injection What is this medicine? IRON SUCROSE (AHY ern SOO krohs) is an iron complex. Iron is used to make healthy red blood cells, which carry oxygen and nutrients throughout the body. This medicine is used to treat iron deficiency anemia in people with chronic kidney disease. This medicine may be used for other purposes; ask your health care provider or pharmacist if you have questions. COMMON BRAND NAME(S): Venofer What should I tell my health care provider before I take this medicine? They need to know if you have any of these conditions: -anemia not caused by low iron levels -heart disease -high levels of iron in the blood -kidney disease -liver disease -an unusual or allergic reaction to iron, other medicines, foods, dyes, or preservatives -pregnant or trying to get pregnant -breast-feeding How should I use this medicine? This medicine is for infusion into a vein. It is given by a health care professional in a hospital or clinic setting. Talk to your pediatrician regarding the use of this medicine in children. While this drug may be prescribed for children as young as 2 years for selected conditions, precautions do apply. Overdosage: If you think you have taken too much of this medicine contact a poison control center or emergency room at once. NOTE: This medicine is only for you. Do not share this medicine with others. What if I miss a dose? It is important not to miss your dose. Call your doctor or health care professional if you are unable to keep an appointment. What may interact with this medicine? Do not take this medicine with any of the following medications: -deferoxamine -dimercaprol -other iron products This medicine may also interact with the following medications: -chloramphenicol -deferasirox This list may not describe all possible interactions. Give your health care provider a list of all the medicines, herbs, non-prescription drugs, or dietary  supplements you use. Also tell them if you smoke, drink alcohol, or use illegal drugs. Some items may interact with your medicine. What should I watch for while using this medicine? Visit your doctor or healthcare professional regularly. Tell your doctor or healthcare professional if your symptoms do not start to get better or if they get worse. You may need blood work done while you are taking this medicine. You may need to follow a special diet. Talk to your doctor. Foods that contain iron include: whole grains/cereals, dried fruits, beans, or peas, leafy green vegetables, and organ meats (liver, kidney). What side effects may I notice from receiving this medicine? Side effects that you should report to your doctor or health care professional as soon as possible: -allergic reactions like skin rash, itching or hives, swelling of the face, lips, or tongue -breathing problems -changes in blood pressure -cough -fast, irregular heartbeat -feeling faint or lightheaded, falls -fever or chills -flushing, sweating, or hot feelings -joint or muscle aches/pains -seizures -swelling of the ankles or feet -unusually weak or tired Side effects that usually do not require medical attention (report to your doctor or health care professional if they continue or are bothersome): -diarrhea -feeling achy -headache -irritation at site where injected -nausea, vomiting -stomach upset -tiredness This list may not describe all possible side effects. Call your doctor for medical advice about side effects. You may report side effects to FDA at 1-800-FDA-1088. Where should I keep my medicine? This drug is given in a hospital or clinic and will not be stored at home. NOTE: This sheet is a summary. It may not cover all possible information. If   you have questions about this medicine, talk to your doctor, pharmacist, or health care provider.  2018 Elsevier/Gold Standard (2011-07-24 17:14:35)  

## 2018-02-22 ENCOUNTER — Other Ambulatory Visit (INDEPENDENT_AMBULATORY_CARE_PROVIDER_SITE_OTHER): Payer: Medicare Other

## 2018-02-22 ENCOUNTER — Telehealth: Payer: Self-pay | Admitting: Internal Medicine

## 2018-02-22 ENCOUNTER — Ambulatory Visit (INDEPENDENT_AMBULATORY_CARE_PROVIDER_SITE_OTHER): Payer: Medicare Other | Admitting: Internal Medicine

## 2018-02-22 ENCOUNTER — Encounter: Payer: Self-pay | Admitting: Internal Medicine

## 2018-02-22 VITALS — BP 130/80 | HR 74 | Ht 63.0 in | Wt 227.6 lb

## 2018-02-22 DIAGNOSIS — R0609 Other forms of dyspnea: Secondary | ICD-10-CM

## 2018-02-22 DIAGNOSIS — J449 Chronic obstructive pulmonary disease, unspecified: Secondary | ICD-10-CM

## 2018-02-22 DIAGNOSIS — R0600 Dyspnea, unspecified: Secondary | ICD-10-CM

## 2018-02-22 LAB — CBC WITH DIFFERENTIAL/PLATELET
Basophils Absolute: 0.1 10*3/uL (ref 0.0–0.1)
Basophils Relative: 1.1 % (ref 0.0–3.0)
Eosinophils Absolute: 0.2 10*3/uL (ref 0.0–0.7)
Eosinophils Relative: 2.7 % (ref 0.0–5.0)
HCT: 30.5 % — ABNORMAL LOW (ref 36.0–46.0)
Hemoglobin: 10.1 g/dL — ABNORMAL LOW (ref 12.0–15.0)
Lymphocytes Relative: 13.9 % (ref 12.0–46.0)
Lymphs Abs: 0.9 10*3/uL (ref 0.7–4.0)
MCHC: 33.1 g/dL (ref 30.0–36.0)
MCV: 89.1 fl (ref 78.0–100.0)
Monocytes Absolute: 0.6 10*3/uL (ref 0.1–1.0)
Monocytes Relative: 9.3 % (ref 3.0–12.0)
Neutro Abs: 4.8 10*3/uL (ref 1.4–7.7)
Neutrophils Relative %: 73 % (ref 43.0–77.0)
Platelets: 225 10*3/uL (ref 150.0–400.0)
RBC: 3.43 Mil/uL — ABNORMAL LOW (ref 3.87–5.11)
RDW: 16.8 % — ABNORMAL HIGH (ref 11.5–15.5)
WBC: 6.5 10*3/uL (ref 4.0–10.5)

## 2018-02-22 LAB — BASIC METABOLIC PANEL
BUN: 38 mg/dL — ABNORMAL HIGH (ref 6–23)
CO2: 31 mEq/L (ref 19–32)
Calcium: 9 mg/dL (ref 8.4–10.5)
Chloride: 97 mEq/L (ref 96–112)
Creatinine, Ser: 1.51 mg/dL — ABNORMAL HIGH (ref 0.40–1.20)
GFR: 35.28 mL/min — ABNORMAL LOW (ref >60.00)
Glucose, Bld: 168 mg/dL — ABNORMAL HIGH (ref 70–99)
Potassium: 4.5 mEq/L (ref 3.5–5.1)
Sodium: 137 mEq/L (ref 135–145)

## 2018-02-22 NOTE — Patient Instructions (Addendum)
Dyspnea on exertion   - this is due to many reasons - for 02/22/2018 - check cbc, bmet and walk test on room air - overall very tough problem to fix due to multiple issues such as weight, heart, anemia, lungs all making you winded  Chronic obstructive pulmonary disease, unspecified COPD type ([place])  - stable without flare up  - continue breo scheduled  Followup 3-6 months or sooner if needed

## 2018-02-22 NOTE — Progress Notes (Signed)
Subjective:     Patient ID: Patricia [name], female   DOB: 08-Jun-1936, 82 y.o.   MRN: [id number]  HPI OV dec 2017 This is the case of Patricia [name], 82 y.o. Female, who Is being seen today for shortness of breath.  As you very well know, patient  is a 82 year old obese white female with history of aortic stenosis, hypertension, atrial fibrillation, coronary artery disease, chronic diastolic congestive heart failure, acute on chronic hypoxic respiratory failure, hyperlipidemia, and GI bleeding with anemia.  She recently had a TAVR a week ago.   Pt had a 61 PY smoking history, quit when she was 82 yrs old. Pt was diagnosed with adult asthma. Triggers: strong odors, pollen, change in weather. Has been on advair 100/50 1P BID for a while. Also on alb as needed.   She was started on O2 2L 24/7 during Thanksgiving admission for SOB.   Pt's SOB is some better after having TAVR. She continues to have episodic SOB.   Pt has snoring, occasional gasping or choking. Has witnessed apneas.  Has hypersomnia, mild. Naps daily.  Hypersomnia affects her fxnality.   ......................................................................Marland Kitchen  ROV 01/26/17 Patient returns to the office as follow-up on her dyspnea. Since last seen, she is overall improved. She barely uses her oxygen now during the daytime. She uses oxygen at bedtime. She checks her O2 saturation and it usually runs in the mid 90s on room air with exertion. Her asthma has also been stable. She is finishing her Advair and will switch to [employer].  because of insurance coverage.   OV 10/15/2017  Chief Complaint  Patient presents with  . Follow-up    Former [employer] pt.  CT scan done 12/13 and is here to go over the results.   Pt states things have been bad since last visit. Pt has had numerous flare-ups with her asthma. Has complaints of SOB and chest discomfort.    82 year old obese female who is to be followed by Dr. Murlean Iba for dyspnea that was  believed to be due to obesity, diastolic dysfunction aortic valve stenosis and asthma versus emphysema.  She also has a 5 mm left upper lobe nodule.  There is a history of hypersomnia  Patient presents for transfer of care after Dr. DDS left the practice.  A year ago she had T AVR procedure for aortic valve stenosis.  After that her dyspnea improved.  However now for the last 1 month her dyspnea is worse but there is no increased cough.  She has an occasional wheezing.  She did see cardiology recently and her BNP is high and increase diuresis has been called in.  She had an echocardiogram I reviewed the chart.  It shows diastolic dysfunction.  She continues to be on Brio for her reported diagnosis of asthma although she is unable to do exam nitric oxide today with [place].  She did have a CT scan of the chest this month 2018 that I personally visualized.  The 5 mm left upper lobe nodule has resolved.  There is no evidence of ILD although this is non-high-resolution CT chest.  She does have some emphysema.  She continues to be on Brio which states works well for her.  I noted that she is on ACE inhibitor    OV 11/19/2017  Chief Complaint  Patient presents with  . Acute Visit    Pt stated breathing has become worse x3 weeks. Pt was in the hosp. 11/12/17 due to afib. Also  has some complaints of mild cough.     82 year old obese female who has multifactorial dyspnea due to obesity, diastolic dysfunction, atrial fibrillation, aortic stenosis and emphysema versus asthma for which she is on Brio.  She had a 5 mm nodule left upper lobe that was resolved  Last visit December 2018.  Since then she is telling me that she has had 3 weeks of worsening shortness of breath.  This is an acute visit.  On November 13, 2017 she got admitted for self palpation of tachycardia.  She was noticed to be in atrial fibrillation with rapid ventricular rhythm.  She says she had electrical cardioversion and discharge.  After the dyspnea  improved a little bit but not much.  There is significant worsening and associated wheezing.  She is using albuterol on a regular basis.  There is no worsening edema orthopnea proximal nocturnal dyspnea or fever or phlegm production.  She did see cardiology yesterday and reported that her asthma was getting worse.  She is compliant with her Brio.  Walking desaturation test today in the office: RA x 3 laps: did only  1 lap and stopped due to knee pain.No tachy. No desats  FeNO 11/19/2017 - 24ppb  Results for BRIAHNNA, HARRIES (MRN [id number]) as of 10/15/2017 12:07  Ref. Range 09/16/2016 14:00  FEV1-Pre Latest Units: L 1.36  FEV1-%Pred-Pre Latest Units: % 74  Pre FEV1/FVC ratio Latest Units: % 71  Results for WILLETTA, YORK (MRN [id number]) as of 10/15/2017 12:07  Ref. Range 09/16/2016 14:00  TLC Latest Units: L 3.59  TLC % pred Latest Units: % 75  Results for CRISTIAN, GRIEVES (MRN [id number]) as of 10/15/2017 12:07  Ref. Range 09/16/2016 14:00  DLCO unc Latest Units: ml/min/mmHg 12.73  DLCO unc % pred Latest Units: % 59  IMPRESSION: 1. 5 mm nodule seen previously in the lingula has resolved in the interval. 2.  Emphysema. (ICD10-J43.9) 3.  Aortic Atherosclerois (ICD10-170.0)   Electronically Signed   By: Misty Stanley M.D.   On: 10/08/2017 15:55   OV 02/22/2018  Chief Complaint  Patient presents with  . Follow-up    Hospital 3/27-4/2 due to a GI bleed.  Pt states she has had good and bad days with her breathing. States she has had some wheezing, especially after an injection of iron last Friday. Pt still can become SOB at anytime.    82 year old obese female who has multifactorial dyspnea due to obesity, diastolic dysfunction, atrial fibrillation, aortic stenosis and emphysema versus asthma for which she is on Brio.  She had a 5 mm nodule left upper lobe that was resolved   82 year old morbidly obese female with multifactorial dyspnea as stated above.  She is on Breo.  Last seen  in January 2019 for acute bronchitis.  She comes for routine follow-up.  Approximately 1 month ago she got admitted for occult GI bleed.  Her hemoglobin was 4 g% according to her history.  It did improve to 8 g% and above for the time of discharge which I personally reviewed the record and confirmed and visualize the lab.  She tells me that the reason for admission was severe fatigue.  Since then the fatigue is improved but she still has residual baseline fatigue and shortness of breath with exertion.  She has very limited mobility because of obesity and DJD and other medical problems.  Overall she is stable but the level of fatigue and shortness of breath is pretty significant class III.  She  says when she is flat and stable and at rest she has no problems but when she moves a little she gets dyspneic.  There is some wheezing as well but no cough or phlegm or fever. On Iron replacemen with iron infusion and not better. Fatigue is moderate   Walking desat test - 185 feet x 3 laps on RA - stopped at 1 lap due to dyspnea and back issues. Walked slow with came - HR 6 9 -> 84, Pulse ox 98% -> 93%   has a past medical history of Allergy, Anemia, unspecified, Arthritis, Asthma, Atrial fibrillation status post cardioversion [facility]) (09/2015), Benign neoplasm of colon, Carpal tunnel syndrome, Cellulitis and abscess of finger, unspecified, Cerumen impaction, Cervicalgia, CHF (congestive heart failure) ([place]), Chronic airway obstruction, not elsewhere classified, Chronic anticoagulation (09/2015), Diarrhea, Diffuse cystic mastopathy, Edema, Encounter for long-term (current) use of other medications, GERD (gastroesophageal reflux disease), Heart murmur, Lumbago, Neck pain (05/23/2015), Obesity, unspecified, Other and unspecified hyperlipidemia, Other psoriasis, Pain in joint, lower leg, PONV (postoperative nausea and vomiting), Primary localized osteoarthrosis of right shoulder (12/18/2015), S/P TAVR (transcatheter aortic  valve replacement) (10/14/2016), Scoliosis (and kyphoscoliosis), idiopathic, Shortness of breath dyspnea, Type II or unspecified type diabetes mellitus without mention of complication, uncontrolled, Unspecified essential hypertension, Unspecified hemorrhoids without mention of complication, Unspecified hypothyroidism, and Urge incontinence.   reports that she quit smoking about 28 years ago. She has a 40.00 pack-year smoking history. She has never used smokeless tobacco.  Past Surgical History:  Procedure Laterality Date  . ABDOMINAL HYSTERECTOMY  1987   complete  . BREAST BIOPSY Right   . BREAST EXCISIONAL BIOPSY Left 2011  . BREAST EXCISIONAL BIOPSY Right    x2  . BREAST SURGERY     right breast 3 surgeries [phone]  . CARDIAC CATHETERIZATION N/A 09/02/2016   Procedure: Right/Left Heart Cath and Coronary Angiography;  Surgeon: [name] M Martinique, MD;  Location: [place] CV LAB;  Service: Cardiovascular;  Laterality: N/A;  . CARDIOVERSION N/A 10/19/2015   Procedure: CARDIOVERSION;  Surgeon: Lelon Perla, MD;  Location: [facility] ENDOSCOPY;  Service: Cardiovascular;  Laterality: N/A;  . CARDIOVERSION N/A 11/13/2017   Procedure: CARDIOVERSION;  Surgeon: Jerline Pain, MD;  Location: [place];  Service: Cardiovascular;  Laterality: N/A;  . [place]  . COLON SURGERY     2004,2007,2013.  3 times colon surgieres  . ESOPHAGOGASTRODUODENOSCOPY (EGD) WITH PROPOFOL Left 03/13/2017   Procedure: ESOPHAGOGASTRODUODENOSCOPY (EGD) WITH PROPOFOL;  Surgeon: Ronnette Juniper, MD;  Location: [place];  Service: Gastroenterology;  Laterality: Left;  . ESOPHAGOGASTRODUODENOSCOPY (EGD) WITH PROPOFOL Left 01/22/2018   Procedure: ESOPHAGOGASTRODUODENOSCOPY (EGD) WITH PROPOFOL;  Surgeon: Arta Silence, MD;  Location[facility] ENDOSCOPY;  Service: Endoscopy;  Laterality: Left;  . EXCISE LE MANDIBULAR LYMPH NODE T     DR C. NEWMAN  . FOOT SURGERY  1989  . GIVENS CAPSULE STUDY Left  01/23/2018   Procedure: GIVENS CAPSULE STUDY;  Surgeon: Wilford Corner, MD;  Location: [facility] ENDOSCOPY;  Service: Endoscopy;  Laterality: Left;  . LEFT SHOULDER ARTHROSCOPY    . 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 NEUROPLASTY / TRANSPOSITION MEDIAN NERVE AT CARPAL TUNNEL BILATERAL  05/2003  . TEE WITHOUT CARDIOVERSION N/A 10/19/2015   Procedure: Transesophageal Echocardiogram (TEE) ;  Surgeon: Lelon Perla, MD;  Location: [facility] ENDOSCOPY;  Service: Cardiovascular;  Laterality: N/A;  . TEE WITHOUT CARDIOVERSION N/A 10/14/2016   Procedure: TRANSESOPHAGEAL ECHOCARDIOGRAM (TEE);  Surgeon: Sherren Mocha, MD;  Location: [place];  Service: Open Heart Surgery;  Laterality: N/A;  . TOTAL SHOULDER ARTHROPLASTY Right 12/18/2015   Procedure: TOTAL SHOULDER ARTHROPLASTY;  Surgeon: [name], MD;  Location: [place];  Service: Orthopedics;  Laterality: Right;  . TRANSCATHETER AORTIC VALVE REPLACEMENT, TRANSFEMORAL N/A 10/14/2016   Procedure: TRANSCATHETER AORTIC VALVE REPLACEMENT, TRANSFEMORAL;  Surgeon: Sherren Mocha, MD;  Location: [place];  Service: Open Heart Surgery;  Laterality: N/A;  . TRIGGER FINGER RELEASE Left   . VAGINAL CYST REMOVED  1967    Allergies  Allergen Reactions  . Codeine Other (See Comments)    "Spaces out" the patient and she cannot walk well  . Vesicare [Solifenacin] Itching    Immunization History  Administered Date(s) Administered  . DTaP 08/03/2003  . Influenza, High Dose Seasonal PF 06/15/2017  . Influenza,inj,Quad PF,6+ Mos 09/29/2013, 07/19/2014, 09/26/2015, 09/13/2016  . Influenza-Unspecified 11/04/2012  . Pneumococcal Conjugate-13 09/26/2015  . Pneumococcal Polysaccharide-23 12/01/1993, 08/19/2017    Family History  Problem Relation Age of Onset  . Diabetes Father   . Stroke Father   . Heart disease Father   . Liver cancer Sister   . Heart disease Brother   . Asthma Sister   . Arthritis Sister      Current Outpatient Medications:  .  acetaminophen  (TYLENOL) 500 MG tablet, Take 500 mg by mouth every 6 (six) hours as needed for mild pain., Disp: , Rfl:  .  albuterol (PROVENTIL) (2.5 MG/3ML) 0.083% nebulizer solution, USE 1 VIAL IN NEBULIZER EVERY 4 HOURS AND AS NEEDED. Generic: VENTOLIN (Patient taking differently: use 1 vial in nebulizer every 4 hours and as needed for wheezing), Disp: 60 vial, Rfl: 10 .  albuterol (VENTOLIN HFA) 108 (90 Base) MCG/ACT inhaler, Inhale 2 puffs into the lungs every 6 (six) hours as needed for wheezing or shortness of breath., Disp: 1 Inhaler, Rfl: 11 .  amiodarone (PACERONE) 200 MG tablet, Take 200 mg by mouth daily., Disp: , Rfl:  .  cetirizine (ZYRTEC) 10 MG tablet, Take 10 mg by mouth daily as needed for allergies. , Disp: , Rfl:  .  cholecalciferol (VITAMIN D) 1000 units tablet, Take 1,000 Units by mouth at bedtime. , Disp: , Rfl:  .  doxazosin (CARDURA) 4 MG tablet, Take 1 tablet (4 mg total) by mouth daily., Disp: 30 tablet, Rfl: 0 .  ferrous sulfate 325 (65 FE) MG EC tablet, Take 325 mg by mouth 2 (two) times daily., Disp: , Rfl:  .  fluticasone furoate-vilanterol (BREO ELLIPTA) 100-25 MCG/INH AEPB, Inhale into the lungs once daily (Patient taking differently: Inhale 1 puff into the lungs daily. ), Disp: 1 each, Rfl: 5 .  furosemide (LASIX) 40 MG tablet, Take 2 tablets (80 mg total) by mouth daily., Disp: 90 tablet, Rfl: 1 .  glimepiride (AMARYL) 2 MG tablet, Take one tablet by mouth once daily at breakfast to control glucose (Patient taking differently: Take 2 mg by mouth daily. ), Disp: 90 tablet, Rfl: 1 .  Ketotifen Fumarate (ALLERGY EYE DROPS OP), Place 1 drop into both eyes 3 (three) times daily as needed (allergies). , Disp: , Rfl:  .  levothyroxine (SYNTHROID, LEVOTHROID) 88 MCG tablet, Take 1 tablet (88 mcg total) by mouth daily., Disp: 90 tablet, Rfl: 3 .  mirabegron ER (MYRBETRIQ) 25 MG TB24 tablet, One daily to help bladder control (Patient taking differently: Take 25 mg by mouth daily. ), Disp: 90  tablet, Rfl: 5 .  pantoprazole (PROTONIX) 40 MG tablet, TAKE 1 TABLET BY MOUTH TWO  TIMES  DAILY (Patient taking differently: 27m by mouth twice daily), Disp: 180 tablet, Rfl: 1 .  potassium gluconate 595 (99 K) MG TABS tablet, Take 595 mg by mouth See admin instructions. TAKES ONLY WHEN USING FUROSEMIDE, Disp: , Rfl:  .  pravastatin (PRAVACHOL) 20 MG tablet, Take 1 tablet (20 mg total) by mouth daily., Disp: 90 tablet, Rfl: 1 .  triamcinolone (KENALOG) 0.025 % cream, Apply 1 application topically daily as needed (psoriasis)., Disp: 30 g, Rfl: 0    Review of Systems     Objective:   Physical Exam Vitals:   02/22/18 1015  BP: 130/80  Pulse: 74  SpO2: 92%  Weight: 227 lb 9.6 oz (103.2 kg)  Height: _0  (1.6 m)    Estimated body mass index is 40.32 kg/m as calculated from the following:   Height as of this encounter: _1  (1.6 m).   Weight as of this encounter: 227 lb 9.6 oz (103.2 kg).   General Appearance:   OBESE - ++  Head:    Normocephalic, without obvious abnormality, atraumatic  Eyes:    PERRL - yes, conjunctiva/corneas - clear      Ears:    Normal external ear canals, both ears  Nose:   NG tube - no  Throat:  ETT TUBE - no , OG tube - no  Neck:   Supple,  No enlargement/tenderness/nodules     Lungs:     Clear to auscultation bilaterally but at base has crackles c/w atelectasis (CT 2018 and CT abd lung cut April 2019 that I visualized shows these atelecatsis)  Chest wall:    No deformity  Heart:    S1 and S2 normal, ES murmur  ++, CVP - no.  Pressors - no  Abdomen:     Soft, no masses, no organomegaly. OBESE+  Genitalia:    Not done  Rectal:   not done  Extremities:   Extremities- intact. HAS CANE     Skin:   Intact in exposed areas .     Neurologic:   Sedation - none -> RASS - na . Moves all 4s - yes. CAM-ICU - neg . Orientation - x3+        Assessment:       ICD-10-CM   1. Dyspnea on exertion R06.09   2. Chronic obstructive pulmonary disease, unspecified  COPD type ([place] J44.9        Plan:     Dyspnea on exertion   - this is due to many reasons - for 02/22/2018 - check cbc, bmet and walk test on room air - overall very tough problem to fix due to multiple issues such as weight, heart, anemia, lungs all making you winded  Chronic obstructive pulmonary disease, unspecified COPD type ([place]  - stable without flare up  - continue breo scheduled  Followup 3-6 months or sooner if needed    Dr. MBrand Males M.D., FFlagler HospitalC.P Pulmonary and Critical Care Medicine Staff Physician, CSt. JohnsDirector - Interstitial Lung Disease  Program  [place]ulmonary [place] [address] Pager: [phone] If no answer or between  15:00h - 7:00h: call [phone] Telephone: [phone]

## 2018-02-22 NOTE — Telephone Encounter (Signed)
  Creat 1.31m% around baseline  Anemia  - better 10gm%   PULMONARY No results for input(s): PHART, PCO2ART, PO2ART, HCO3, TCO2, O2SAT in the last 168 hours.  Invalid input(s): PCO2, PO2  CBC Recent Labs  Lab 02/22/18 1057  HGB 10.1*  HCT 30.5*  WBC 6.5  PLT 225.0    COAGULATION No results for input(s): INR in the last 168 hours.  CARDIAC  No results for input(s): TROPONINI in the last 168 hours. No results for input(s): PROBNP in the last 168 hours.   CHEMISTRY Recent Labs  Lab 02/22/18 1057  NA 137  K 4.5  CL 97  CO2 31  GLUCOSE 168*  BUN 38*  CREATININE 1.51*  CALCIUM 9.0   Estimated Creatinine Clearance: 34.7 mL/min (A) (by C-G formula based on SCr of 1.51 mg/dL (H)).   LIVER No results for input(s): AST, ALT, ALKPHOS, BILITOT, PROT, ALBUMIN, INR in the last 168 hours.   INFECTIOUS No results for input(s): LATICACIDVEN, PROCALCITON in the last 168 hours.   ENDOCRINE CBG (last 3)  No results for input(s): GLUCAP in the last 72 hours.       IMAGING x48h  - image(s) personally visualized  -   highlighted in bold No results found.

## 2018-02-23 NOTE — Telephone Encounter (Signed)
Called and spoke with pt letting her know the results of her labwork.  Pt expressed understanding. Nothing further needed at this time.

## 2018-02-26 ENCOUNTER — Other Ambulatory Visit: Payer: Self-pay | Admitting: Internal Medicine

## 2018-02-26 ENCOUNTER — Telehealth: Payer: Self-pay | Admitting: Internal Medicine

## 2018-02-26 DIAGNOSIS — I10 Essential (primary) hypertension: Secondary | ICD-10-CM

## 2018-02-26 NOTE — Telephone Encounter (Signed)
Was able to release lab results to mychart with Keane Scrape' help.  Spoke with pt to make aware.  Nothing further needed.

## 2018-03-11 ENCOUNTER — Telehealth (HOSPITAL_COMMUNITY): Payer: Self-pay

## 2018-03-11 NOTE — Telephone Encounter (Signed)
Called to follow up with patient in regards to Pulmonary Rehab - Patient stated she is not going to participate in the program. Closed referral.

## 2018-04-12 DIAGNOSIS — D500 Iron deficiency anemia secondary to blood loss (chronic): Secondary | ICD-10-CM | POA: Diagnosis not present

## 2018-04-15 ENCOUNTER — Other Ambulatory Visit: Payer: Self-pay | Admitting: Nurse Practitioner

## 2018-04-16 ENCOUNTER — Other Ambulatory Visit: Payer: Self-pay | Admitting: Internal Medicine

## 2018-04-16 DIAGNOSIS — N393 Stress incontinence (female) (male): Secondary | ICD-10-CM

## 2018-04-19 ENCOUNTER — Other Ambulatory Visit: Payer: Medicare Other

## 2018-04-19 DIAGNOSIS — R197 Diarrhea, unspecified: Secondary | ICD-10-CM | POA: Diagnosis not present

## 2018-04-19 DIAGNOSIS — D500 Iron deficiency anemia secondary to blood loss (chronic): Secondary | ICD-10-CM | POA: Diagnosis not present

## 2018-04-20 ENCOUNTER — Other Ambulatory Visit: Payer: Medicare Other

## 2018-04-20 DIAGNOSIS — E039 Hypothyroidism, unspecified: Secondary | ICD-10-CM | POA: Diagnosis not present

## 2018-04-20 DIAGNOSIS — N183 Chronic kidney disease, stage 3 (moderate): Secondary | ICD-10-CM | POA: Diagnosis not present

## 2018-04-20 DIAGNOSIS — E119 Type 2 diabetes mellitus without complications: Secondary | ICD-10-CM | POA: Diagnosis not present

## 2018-04-20 DIAGNOSIS — E782 Mixed hyperlipidemia: Secondary | ICD-10-CM | POA: Diagnosis not present

## 2018-04-20 DIAGNOSIS — D649 Anemia, unspecified: Secondary | ICD-10-CM | POA: Diagnosis not present

## 2018-04-21 ENCOUNTER — Ambulatory Visit (INDEPENDENT_AMBULATORY_CARE_PROVIDER_SITE_OTHER): Payer: Medicare Other | Admitting: Nurse Practitioner

## 2018-04-21 ENCOUNTER — Encounter: Payer: Self-pay | Admitting: Nurse Practitioner

## 2018-04-21 VITALS — BP 128/80 | HR 75 | Temp 98.4°F | Ht 63.0 in | Wt 226.0 lb

## 2018-04-21 DIAGNOSIS — N183 Chronic kidney disease, stage 3 unspecified: Secondary | ICD-10-CM

## 2018-04-21 DIAGNOSIS — E785 Hyperlipidemia, unspecified: Secondary | ICD-10-CM | POA: Diagnosis not present

## 2018-04-21 DIAGNOSIS — E119 Type 2 diabetes mellitus without complications: Secondary | ICD-10-CM | POA: Diagnosis not present

## 2018-04-21 DIAGNOSIS — D500 Iron deficiency anemia secondary to blood loss (chronic): Secondary | ICD-10-CM | POA: Diagnosis not present

## 2018-04-21 DIAGNOSIS — J449 Chronic obstructive pulmonary disease, unspecified: Secondary | ICD-10-CM | POA: Diagnosis not present

## 2018-04-21 DIAGNOSIS — E039 Hypothyroidism, unspecified: Secondary | ICD-10-CM

## 2018-04-21 LAB — CBC WITH DIFFERENTIAL/PLATELET
Basophils Absolute: 41 cells/uL (ref 0–200)
Basophils Relative: 0.7 %
Eosinophils Absolute: 112 cells/uL (ref 15–500)
Eosinophils Relative: 1.9 %
HCT: 34.1 % — ABNORMAL LOW (ref 35.0–45.0)
Hemoglobin: 10.9 g/dL — ABNORMAL LOW (ref 11.7–15.5)
Lymphs Abs: 1162 cells/uL (ref 850–3900)
MCH: 27.7 pg (ref 27.0–33.0)
MCHC: 32 g/dL (ref 32.0–36.0)
MCV: 86.5 fL (ref 80.0–100.0)
MPV: 10.7 fL (ref 7.5–12.5)
Monocytes Relative: 9.1 %
Neutro Abs: 4047 cells/uL (ref 1500–7800)
Neutrophils Relative %: 68.6 %
Platelets: 217 10*3/uL (ref 140–400)
RBC: 3.94 10*6/uL (ref 3.80–5.10)
RDW: 14.9 % (ref 11.0–15.0)
Total Lymphocyte: 19.7 %
WBC mixed population: 537 cells/uL (ref 200–950)
WBC: 5.9 10*3/uL (ref 3.8–10.8)

## 2018-04-21 LAB — HEMOGLOBIN A1C
Hgb A1c MFr Bld: 7 % of total Hgb — ABNORMAL HIGH (ref <5.7)
Mean Plasma Glucose: 154 (calc)
eAG (mmol/L): 8.5 (calc)

## 2018-04-21 LAB — COMPLETE METABOLIC PANEL WITH GFR
AG Ratio: 1.3 (calc) (ref 1.0–2.5)
ALT: 20 U/L (ref 6–29)
AST: 20 U/L (ref 10–35)
Albumin: 3.9 g/dL (ref 3.6–5.1)
Alkaline phosphatase (APISO): 58 U/L (ref 33–130)
BUN/Creatinine Ratio: 21 (calc) (ref 6–22)
BUN: 37 mg/dL — ABNORMAL HIGH (ref 7–25)
CO2: 30 mmol/L (ref 20–32)
Calcium: 9.7 mg/dL (ref 8.6–10.4)
Chloride: 99 mmol/L (ref 98–110)
Creat: 1.74 mg/dL — ABNORMAL HIGH (ref 0.60–0.93)
GFR, Est African American: 32 mL/min/{1.73_m2} — ABNORMAL LOW (ref >=60)
GFR, Est Non African American: 27 mL/min/{1.73_m2} — ABNORMAL LOW (ref >=60)
Globulin: 3 g/dL (calc) (ref 1.9–3.7)
Glucose, Bld: 182 mg/dL — ABNORMAL HIGH (ref 65–99)
Potassium: 4.7 mmol/L (ref 3.5–5.3)
Sodium: 138 mmol/L (ref 135–146)
Total Bilirubin: 0.4 mg/dL (ref 0.2–1.2)
Total Protein: 6.9 g/dL (ref 6.1–8.1)

## 2018-04-21 LAB — LIPID PANEL
Cholesterol: 198 mg/dL (ref <200)
HDL: 63 mg/dL (ref >50)
LDL Cholesterol (Calc): 106 mg/dL (calc) — ABNORMAL HIGH
Non-HDL Cholesterol (Calc): 135 mg/dL (calc) — ABNORMAL HIGH (ref <130)
Total CHOL/HDL Ratio: 3.1 (calc) (ref <5.0)
Triglycerides: 174 mg/dL — ABNORMAL HIGH (ref <150)

## 2018-04-21 LAB — TSH: TSH: 6.79 mIU/L — ABNORMAL HIGH (ref 0.40–4.50)

## 2018-04-21 NOTE — Progress Notes (Signed)
Careteam: Patient Care Team: [name], NP as PCP - General (Geriatric Medicine) Martinique, [name] M, MD as PCP - Cardiology (Cardiology) [name], MD as Consulting Physician (Orthopedic Surgery) Druscilla Brownie, MD as Consulting Physician (Dermatology) Ralene Bathe, MD as Consulting Physician (Ophthalmology)  Advanced Directive information Does Patient Have a Medical Advance Directive?: Yes, Type of Advance Directive: Healthcare Power of Attorney  Allergies  Allergen Reactions  . Codeine Other (See Comments)    "Spaces out" the patient and she cannot walk well  . Vesicare [Solifenacin] Itching    Chief Complaint  Patient presents with  . Medical Management of Chronic Issues    Pt is being seen for a 4 month routine visit. Pt has no concerns today. Labs have been printed.   . ACP    Pt has HCPOA- in chart     HPI: Patient is a 82 y.o. female seen in the office today for routine follow up.  She does have the significant past medical history for hypothyroidism, hypertension, type 2 diabetes mellitus, systolic heart failure, status post TAVR, atrial fibrillation on anticoagulation  Pt was hospitalized in March with acute blood loss anemia (hgb 4) due to suspected upper GI bleed. She was transfused 4 ?6 units PRBC and anticoagulation held. Given PPI. upper endoscopy showed normal esophagus, erosive gastropathy, normal duodenum. CT of the abdomen with no source of bleeding. She underwent capsule endoscopy. Follows with [facility]- has gotten iron replacement and planning on having another iron transfusion at short stay. Bleeding thought to be coming from AVMs . Saw GI 2 days ago, having a lot of problems with her stomach and diarrhea.  Had a gluten test and placed her on colestid.   CHF- lasix was increased to 80 mg daily in April. No increase in swelling or shortness of breath.   Afib- continues on amiodarone, saw cardiologist in April. Off all anticoagulation    DM- A1c 7.0, without hypoglycemia, on glimepiride   Hypothyroid- TSH slightly elevated, taking synthroid 88 mcg daily   Hyperlipidemia- LDL up (106) from previous (76). On pravachol 20 mg daily without missed doses, no changes in diet nted.    COPD- following with pulmonary- stable. continues on breo  Review of Systems:  Review of Systems  Constitutional: Positive for malaise/fatigue (improved). Negative for chills, fever and weight loss.  HENT: Negative for congestion and tinnitus.   Respiratory: Positive for shortness of breath (hx of asthma, chronic ongoing follow up with pulmonary). Negative for cough and sputum production.        Overall shortness of breath has been stable and unchanged  Cardiovascular: Negative for chest pain, palpitations and leg swelling.  Gastrointestinal: Positive for diarrhea (followed by GI). Negative for abdominal pain, constipation and heartburn.  Genitourinary: Negative for dysuria, frequency and urgency.  Musculoskeletal: Negative for back pain, falls, joint pain and myalgias.  Skin: Negative.   Neurological: Negative for dizziness, weakness and headaches.  Endo/Heme/Allergies: Positive for environmental allergies.  Psychiatric/Behavioral: Negative for depression and memory loss. The patient does not have insomnia.     Past Medical History:  Diagnosis Date  . Allergy   . Anemia, unspecified   . Arthritis   . Asthma   . Atrial fibrillation status post cardioversion [facility]) 09/2015   s/p TEE/DCCV>>SR on amio  . Benign neoplasm of colon   . Carpal tunnel syndrome   . Cellulitis and abscess of finger, unspecified   . Cerumen impaction   . Cervicalgia   .  CHF (congestive heart failure) ([place])   . Chronic airway obstruction, not elsewhere classified   . Chronic anticoagulation 09/2015   Eliquis  . Diarrhea   . Diffuse cystic mastopathy   . Edema   . Encounter for long-term (current) use of other medications   . GERD (gastroesophageal reflux  disease)   . Heart murmur   . Lumbago   . Neck pain 05/23/2015  . Obesity, unspecified   . Other and unspecified hyperlipidemia   . Other psoriasis   . Pain in joint, lower leg   . PONV (postoperative nausea and vomiting)   . Primary localized osteoarthrosis of right shoulder 12/18/2015  . 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 Scoliosis (and kyphoscoliosis), idiopathic   . Shortness of breath dyspnea    with exertion  . Type II or unspecified type diabetes mellitus without mention of complication, uncontrolled   . Unspecified essential hypertension   . Unspecified hemorrhoids without mention of complication   . Unspecified hypothyroidism   . Urge incontinence    Past Surgical History:  Procedure Laterality Date  . ABDOMINAL HYSTERECTOMY  1987   complete  . BREAST BIOPSY Right   . BREAST EXCISIONAL BIOPSY Left 2011  . BREAST EXCISIONAL BIOPSY Right    x2  . BREAST SURGERY     right breast 3 surgeries [phone]  . CARDIAC CATHETERIZATION N/A 09/02/2016   Procedure: Right/Left Heart Cath and Coronary Angiography;  Surgeon: [name] M Martinique, MD;  Location: [place] [facility];  Service: Cardiovascular;  Laterality: N/A;  . CARDIOVERSION N/A 10/19/2015   Procedure: CARDIOVERSION;  Surgeon: Lelon Perla, MD;  Location: [facility] ENDOSCOPY;  Service: Cardiovascular;  Laterality: N/A;  . CARDIOVERSION N/A 11/13/2017   Procedure: CARDIOVERSION;  Surgeon: Jerline Pain, MD;  Location: [place];  Service: Cardiovascular;  Laterality: N/A;  . [place]  . COLON SURGERY     2004,2007,2013.  3 times colon surgieres  . ESOPHAGOGASTRODUODENOSCOPY (EGD) WITH PROPOFOL Left 03/13/2017   Procedure: ESOPHAGOGASTRODUODENOSCOPY (EGD) WITH PROPOFOL;  Surgeon: Ronnette Juniper, MD;  Location: [place];  Service: Gastroenterology;  Laterality: Left;  .  ESOPHAGOGASTRODUODENOSCOPY (EGD) WITH PROPOFOL Left 01/22/2018   Procedure: ESOPHAGOGASTRODUODENOSCOPY (EGD) WITH PROPOFOL;  Surgeon: Arta Silence, MD;  Location: [facility] ENDOSCOPY;  Service: Endoscopy;  Laterality: Left;  . EXCISE LE MANDIBULAR LYMPH NODE T     DR C. NEWMAN  . FOOT SURGERY  1989  . GIVENS CAPSULE STUDY Left 01/23/2018   Procedure: GIVENS CAPSULE STUDY;  Surgeon: Wilford Corner, MD;  Location: [facility] ENDOSCOPY;  Service: Endoscopy;  Laterality: Left;  . LEFT SHOULDER ARTHROSCOPY    . 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 NEUROPLASTY / TRANSPOSITION MEDIAN NERVE AT CARPAL TUNNEL BILATERAL  05/2003  . TEE WITHOUT CARDIOVERSION N/A 10/19/2015   Procedure: Transesophageal Echocardiogram (TEE) ;  Surgeon: Lelon Perla, MD;  Location: [facility] ENDOSCOPY;  Service: Cardiovascular;  Laterality: N/A;  . TEE WITHOUT CARDIOVERSION N/A 10/14/2016   Procedure: TRANSESOPHAGEAL ECHOCARDIOGRAM (TEE);  Surgeon: Sherren Mocha, MD;  Location: [place];  Service: Open Heart Surgery;  Laterality: N/A;  . TOTAL SHOULDER ARTHROPLASTY Right 12/18/2015   Procedure: TOTAL SHOULDER ARTHROPLASTY;  Surgeon: [name], MD;  Location: [place];  Service: Orthopedics;  Laterality: Right;  . TRANSCATHETER AORTIC VALVE REPLACEMENT, TRANSFEMORAL N/A 10/14/2016   Procedure: TRANSCATHETER AORTIC VALVE REPLACEMENT, TRANSFEMORAL;  Surgeon: Sherren Mocha,  MD;  Location: [facility] OR;  Service: Open Heart Surgery;  Laterality: N/A;  . TRIGGER FINGER RELEASE Left   . VAGINAL CYST REMOVED  1967   Social History:   reports that she quit smoking about 28 years ago. She has a 40.00 pack-year smoking history. She has never used smokeless tobacco. She reports that she does not drink alcohol or use drugs.  Family History  Problem Relation Age of Onset  . Diabetes Father   . Stroke Father   . Heart disease Father   . Liver cancer Sister   . Heart disease Brother   . Asthma Sister   . Arthritis Sister     Medications: Patient's  Medications  New Prescriptions   No medications on file  Previous Medications   ACETAMINOPHEN (TYLENOL) 500 MG TABLET    Take 500 mg by mouth every 6 (six) hours as needed for mild pain.   ALBUTEROL (PROVENTIL) (2.5 MG/3ML) 0.083% NEBULIZER SOLUTION    USE 1 VIAL IN NEBULIZER EVERY 4 HOURS AND AS NEEDED. Generic: VENTOLIN   ALBUTEROL (VENTOLIN HFA) 108 (90 BASE) MCG/ACT INHALER    Inhale 2 puffs into the lungs every 6 (six) hours as needed for wheezing or shortness of breath.   AMIODARONE (PACERONE) 200 MG TABLET    Take 200 mg by mouth daily.   CETIRIZINE (ZYRTEC) 10 MG TABLET    Take 10 mg by mouth daily as needed for allergies.    CHOLECALCIFEROL (VITAMIN D) 1000 UNITS TABLET    Take 1,000 Units by mouth at bedtime.    COLESTIPOL (COLESTID) 1 G TABLET    Take 1 tablet by mouth 2 (two) times daily.   DOXAZOSIN (CARDURA) 4 MG TABLET    TAKE ONE TABLET BY MOUTH  ONCE DAILY TO HELP CONTROL  BLOOD PRESSURE   FERROUS SULFATE 325 (65 FE) MG EC TABLET    Take 325 mg by mouth 2 (two) times daily.   FLUTICASONE FUROATE-VILANTEROL (BREO ELLIPTA) 100-25 MCG/INH AEPB    Inhale into the lungs once daily   FUROSEMIDE (LASIX) 40 MG TABLET    Take 2 tablets (80 mg total) by mouth daily.   GLIMEPIRIDE (AMARYL) 2 MG TABLET    Take one tablet by mouth once daily at breakfast to control glucose   KETOTIFEN FUMARATE (ALLERGY EYE DROPS OP)    Place 1 drop into both eyes 3 (three) times daily as needed (allergies).    LEVOTHYROXINE (SYNTHROID, LEVOTHROID) 88 MCG TABLET    Take 1 tablet (88 mcg total) by mouth daily.   MYRBETRIQ 25 MG TB24 TABLET    TAKE 1 TABLET BY MOUTH DAILY TO HELP BLADDER CONTROL   PANTOPRAZOLE (PROTONIX) 40 MG TABLET    TAKE 1 TABLET BY MOUTH TWO  TIMES DAILY   POTASSIUM GLUCONATE 595 (99 K) MG TABS TABLET    Take 595 mg by mouth See admin instructions. TAKES ONLY WHEN USING FUROSEMIDE   PRAVASTATIN (PRAVACHOL) 20 MG TABLET    Take 1 tablet (20 mg total) by mouth daily.   TRIAMCINOLONE  (KENALOG) 0.025 % CREAM    APPLY  CREAM TOPICALLY ONCE DAILY AS NEEDED FOR PSORIASIS  Modified Medications   No medications on file  Discontinued Medications   No medications on file     Physical Exam:  Vitals:   04/21/18 1001  BP: 128/80  Pulse: 75  Temp: 98.4 F (36.9 C)  TempSrc: Oral  SpO2: 96%  Weight: 226 lb (102.5 kg)  Height: _0  (  1.6 m)   Body mass index is 40.03 kg/m.  Physical Exam  Constitutional: She is oriented to person, place, and time. She appears well-developed and well-nourished. No distress.  HENT:  Head: Normocephalic and atraumatic.  Nose: Nose normal.  Mouth/Throat: Oropharynx is clear and moist.  Eyes: Conjunctivae and EOM are normal.  Neck: Normal range of motion. Neck supple. No tracheal deviation present. No thyromegaly present.  Cardiovascular: Normal rate and regular rhythm. Exam reveals no gallop and no friction rub.  Murmur (2/6 SEM) heard. Pulmonary/Chest: Effort normal and breath sounds normal. No respiratory distress. She has no wheezes. She exhibits no tenderness.  Abdominal: Soft. Bowel sounds are normal. She exhibits no distension and no mass. There is no tenderness.  Musculoskeletal: Normal range of motion. She exhibits no edema or tenderness.  Neurological: She is alert and oriented to person, place, and time. She has normal reflexes. She displays normal reflexes. No cranial nerve deficit. Coordination normal.  Skin: No rash noted. No erythema. No pallor.  Psychiatric: She has a normal mood and affect. Judgment and thought content normal.    Labs reviewed: Basic Metabolic Panel: Recent Labs    11/13/17 0904  12/24/17 0842  01/21/18 0543  01/25/18 0446 02/22/18 1057 04/20/18 0915  NA 140   < > 140   < > 139   < > 139 137 138  K 4.5   < > 3.9   < > 3.9   < > 4.1 4.5 4.7  CL 103   < > 100   < > 104   < > 104 97 99  CO2 24   < > 32   < > 23   < > _0 GLUCOSE 168*   < > 165*   < > 170*   < > 160* 168* 182*  BUN 46*   <  > 35*   < > 101*   < > 34* 38* 37*  CREATININE 2.28*   < > 1.67*   < > 2.31*   < > 1.45* 1.51* 1.74*  CALCIUM 9.1   < > 9.2   < > 7.6*   < > 8.5* 9.0 9.7  MG 2.1  --   --   --   --   --   --   --   --   TSH  --   --  5.93*  --  5.553*  --   --   --  6.79*   < > = values in this interval not displayed.   Liver Function Tests: Recent Labs    06/11/17 0832 11/12/17 1828 12/24/17 0842 01/20/18 1503 04/20/18 0915  AST _1 ALT _2 ALKPHOS 56 58  --  40  --   BILITOT 0.3 0.8 0.4 0.5 0.4  PROT 6.6 7.1 6.8 5.8* 6.9  ALBUMIN 3.8 3.7  --  3.0*  --    Recent Labs    01/20/18 1503  LIPASE 29   No results for input(s): AMMONIA in the last 8760 hours. CBC: Recent Labs    01/25/18 0446 02/22/18 1057 04/20/18 0915  WBC 7.5 6.5 5.9  NEUTROABS 5.8 4.8 4,047  HGB 8.4* 10.1* 10.9*  HCT 27.1* 30.5* 34.1*  MCV 96.1 89.1 86.5  PLT 135* 225.0 217   Lipid Panel: Recent Labs    06/11/17 0832 04/20/18 0915  CHOL 158 198  HDL 63 63  LDLCALC 76 106*  TRIG  97 174*  CHOLHDL 2.5 3.1   TSH: Recent Labs    12/24/17 0842 01/21/18 0543 04/20/18 0915  TSH 5.93* 5.553* 6.79*   A1C: Lab Results  Component Value Date   HGBA1C 7.0 (H) 04/20/2018     Assessment/Plan 1. Chronic obstructive pulmonary disease, unspecified COPD type ([place]) Stable, continues on Breo  2. Type 2 diabetes mellitus without complication, without long-term current use of insulin (HCC) A1c at 7.0, will continue current medication, discussed working on diet and increasing activity.   3. CKD (chronic kidney disease) stage 3, GFR 30-59 ml/min (HCC) -Encourage proper hydration and to avoid NSAIDS (Aleve, Advil, Motrin, Ibuprofen)  - COMPLETE METABOLIC PANEL WITH GFR; Future  4. Hypothyroidism, unspecified type -TSH slightly above goal. To continue synthroid 88 mcg, will follow up TSH prior to next visit, may need adjustment of medication at that time - TSH; Future  5. Blood loss  anemia Thought to be due to AVMs, off all anticoagulation due to this; ongoing follow up with GI, and continues with iron transfusion.  - CBC with Differential/Platelets; Future  6. Hyperlipidemia, unspecified hyperlipidemia type -LDL not at goal, on pravastatin, would like to work on diet modifications before increasing dose of medication. Will follow up in 3 months.  - COMPLETE METABOLIC PANEL WITH GFR; Future - Lipid Panel; Future  Next appt: 08/16/2018 Carlos American. [place], [facility] [phone]

## 2018-04-21 NOTE — Patient Instructions (Addendum)
Follow up in 3 months with fasting labs before visit To see Dr Mariea Clonts To see about changing diet to improve cholesterol.    Fat and Cholesterol Restricted Diet Getting too much fat and cholesterol in your diet may cause health problems. Following this diet helps keep your fat and cholesterol at normal levels. This can keep you from getting sick. What types of fat should I choose?  Choose monosaturated and polyunsaturated fats. These are found in foods such as olive oil, canola oil, flaxseeds, walnuts, almonds, and seeds.  Eat more omega-3 fats. Good choices include salmon, mackerel, sardines, tuna, flaxseed oil, and ground flaxseeds.  Limit saturated fats. These are in animal products such as meats, butter, and cream. They can also be in plant products such as palm oil, palm kernel oil, and coconut oil.  Avoid foods with partially hydrogenated oils in them. These contain trans fats. Examples of foods that have trans fats are stick margarine, some tub margarines, cookies, crackers, and other baked goods. What general guidelines do I need to follow?  Check food labels. Look for the words "trans fat" and "saturated fat."  When preparing a meal: ? Fill half of your plate with vegetables and green salads. ? Fill one fourth of your plate with whole grains. Look for the word "whole" as the first word in the ingredient list. ? Fill one fourth of your plate with lean protein foods.  Eat more foods that have fiber, like apples, carrots, beans, peas, and barley.  Eat more home-cooked foods. Eat less at restaurants and buffets.  Limit or avoid alcohol.  Limit foods high in starch and sugar.  Limit fried foods.  Cook foods without frying them. Baking, boiling, grilling, and broiling are all great options.  Lose weight if you are overweight. Losing even a small amount of weight can help your overall health. It can also help prevent diseases such as diabetes and heart disease. What foods can I  eat? Grains Whole grains, such as whole wheat or whole grain breads, crackers, cereals, and pasta. Unsweetened oatmeal, bulgur, barley, quinoa, or brown rice. Corn or whole wheat flour tortillas. Vegetables Fresh or frozen vegetables (raw, steamed, roasted, or grilled). Green salads. Fruits All fresh, canned (in natural juice), or frozen fruits. Meat and Other Protein Products Ground beef (85% or leaner), grass-fed beef, or beef trimmed of fat. Skinless chicken or [place]. Ground chicken or [place]. Pork trimmed of fat. All fish and seafood. Eggs. Dried beans, peas, or lentils. Unsalted nuts or seeds. Unsalted canned or dry beans. Dairy Low-fat dairy products, such as skim or 1% milk, 2% or reduced-fat cheeses, low-fat ricotta or cottage cheese, or plain low-fat yogurt. Fats and Oils Tub margarines without trans fats. Light or reduced-fat mayonnaise and salad dressings. Avocado. Olive, canola, sesame, or safflower oils. Natural peanut or almond butter (choose ones without added sugar and oil). The items listed above may not be a complete list of recommended foods or beverages. Contact your dietitian for more options. What foods are not recommended? Grains White bread. White pasta. White rice. Cornbread. Bagels, pastries, and croissants. Crackers that contain trans fat. Vegetables White potatoes. Corn. Creamed or fried vegetables. Vegetables in a cheese sauce. Fruits Dried fruits. Canned fruit in light or heavy syrup. Fruit juice. Meat and Other Protein Products Fatty cuts of meat. Ribs, chicken wings, bacon, sausage, bologna, salami, chitterlings, fatback, hot dogs, bratwurst, and packaged luncheon meats. Liver and organ meats. Dairy Whole or 2% milk, cream, half-and-half, and cream cheese.  Whole milk cheeses. Whole-fat or sweetened yogurt. Full-fat cheeses. Nondairy creamers and whipped toppings. Processed cheese, cheese spreads, or cheese curds. Sweets and Desserts Corn syrup, sugars,  honey, and molasses. Candy. Jam and jelly. Syrup. Sweetened cereals. Cookies, pies, cakes, donuts, muffins, and ice cream. Fats and Oils Butter, stick margarine, lard, shortening, ghee, or bacon fat. Coconut, palm kernel, or palm oils. Beverages Alcohol. Sweetened drinks (such as sodas, lemonade, and fruit drinks or punches). The items listed above may not be a complete list of foods and beverages to avoid. Contact your dietitian for more information. This information is not intended to replace advice given to you by your health care provider. Make sure you discuss any questions you have with your health care provider. Document Released: 04/13/2012 Document Revised: 06/19/2016 Document Reviewed: 01/12/2014 Elsevier Interactive Patient Education  [employer].

## 2018-04-23 ENCOUNTER — Ambulatory Visit (HOSPITAL_COMMUNITY)
Admission: RE | Admit: 2018-04-23 | Discharge: 2018-04-23 | Disposition: A | Discharge status: Home / Self Care | Payer: Medicare Other | Source: Ambulatory Visit | Attending: Gastroenterology | Admitting: Gastroenterology

## 2018-04-23 DIAGNOSIS — D500 Iron deficiency anemia secondary to blood loss (chronic): Secondary | ICD-10-CM | POA: Insufficient documentation

## 2018-04-23 MED ORDER — SODIUM CHLORIDE 0.9 % IV SOLN
200.0000 mg | Freq: Once | INTRAVENOUS | Status: AC
  Administered 2018-04-23: 200 mg via INTRAVENOUS
  Filled 2018-04-23: qty 10

## 2018-05-07 NOTE — Progress Notes (Signed)
Cardiology Office Note   Date:  05/10/2018   ID:  Patricia [name], DOB Oct 21, 1936, MRN [id number]  PCP:  [name], NP  Cardiologist:   Fitzhugh Vizcarrondo Martinique, MD   Chief Complaint  Patient presents with  . Atrial Fibrillation  . Coronary Artery Disease      History of Present Illness: Patricia [name] is a 82 y.o. female who is seen as a work in for follow up Afib.  She has a history of HTN, obesity, DM, HL, and anemia. She was admitted to the hospital in December 2016 with increased dyspnea. She was found to be in atrial fibrillation with RVR. She had pulmonary edema. Hgb was 10. Echo showed EF 55-60% with moderate to severe AS. She was anticoagulated with Xarelto and started on amiodarone. She had TEE guided DCCV with return to NSR. She was seen back in the office on 11/20/15 and was still in NSR.  On 12/18/15 she underwent right shoulder arthroplasty. Post op Hgb was 9.6. It subsequently dropped to 7.1. She was transfused. She has been on iron. Hgb improved to 10. She did have EGD and colonoscopy with both gastric and colonic polyps. No bleeding seen.   On follow up in October 2017 she noted symptoms of increased dyspnea.  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 Mild pulmonary HTN and elevated filling pressures. She was admitted in November with acute CHF and was diuresed. She was evaluated in valve clinic and underwent transfemoral TAVR on 10/14/16. Post op course was uncomplicated. Repeat Echo in January  Showed higher than expected AV prosthesis gradient felt to be related to vigorous LV systolic function and Valve/ patient mismatch. She has been on  Eliquis. She has noted moderate improvement in her breathing.   She continues to have issues with COPD and bronchitis- followed by primary care and pulmonary.  In March 2018 she was noted to have progressive anemia with dark stools. Hgb down to 7.4. She was transfused and EGD  showed multiple gastric polyps and some oozing AVMs that were cauterized. Once again she was admitted in May with worsening anemia. 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 Transfused 1 units PRBCs. Plan for outpatient capsule endoscopy.   She was seen January 9 with complaints of cough that did not improve with stopping ACEi. Some pulmonary crackles noted at the time. Pulse rate 70 then. CT chest in December showed emphysema and periapical scarring but no acute infiltrates. Seen later in January with increased SOB and rapid HR. She was found to be in Afib. She was admitted and amiodarone dose increased she underwent successful DCCV. Continued on Eliquis.   She was admitted March 27-April 2 with abdominal pain and severe weakness. Hgb down to 4.2. Creatinine up to 2.34. Patient was admitted to the medical ward, she received 4 units of packed red blood cells transfusion and anticoagulation was held. Patient was given proton pump inhibitors for antiacid therapy. Her hemoglobin and hematocrit stabilized with a discharge hemoglobin of 8.4 and hematocrit 27.1. No further signs of bleeding. Further workup with upper endoscopy showed normal esophagus, erosive gastropathy, normal duodenum. CT of the abdomen with no source of bleeding. She underwent capsule endoscopy. This did not show any bleeding source.  She did require IV lasix for volume overload. Creatinine improved to 1.45.   On follow up today she is holding her own. She is still taking lasix 80 mg  daily. Notes she gets real SOB when she bends over that lasts a few minutes. No increase in edema. No palpitations. Reports last Hgb 11.1. Received an iron infusion last week. No wheezing. No chest pain. Very limited by arthritis. No obvious bleeding.    Past Medical History:  Diagnosis Date  . Allergy   . Anemia, unspecified   . Arthritis   . Asthma   . Atrial fibrillation status post cardioversion [facility]) 09/2015   s/p TEE/DCCV>>SR on amio    . Benign neoplasm of colon   . Carpal tunnel syndrome   . Cellulitis and abscess of finger, unspecified   . Cerumen impaction   . Cervicalgia   . CHF (congestive heart failure) ([place])   . Chronic airway obstruction, not elsewhere classified   . Chronic anticoagulation 09/2015   Eliquis  . Diarrhea   . Diffuse cystic mastopathy   . Edema   . Encounter for long-term (current) use of other medications   . GERD (gastroesophageal reflux disease)   . Heart murmur   . Lumbago   . Neck pain 05/23/2015  . Obesity, unspecified   . Other and unspecified hyperlipidemia   . Other psoriasis   . Pain in joint, lower leg   . PONV (postoperative nausea and vomiting)   . Primary localized osteoarthrosis of right shoulder 12/18/2015  . 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 Scoliosis (and kyphoscoliosis), idiopathic   . Shortness of breath dyspnea    with exertion  . Type II or unspecified type diabetes mellitus without mention of complication, uncontrolled   . Unspecified essential hypertension   . Unspecified hemorrhoids without mention of complication   . Unspecified hypothyroidism   . Urge incontinence     Past Surgical History:  Procedure Laterality Date  . ABDOMINAL HYSTERECTOMY  1987   complete  . BREAST BIOPSY Right   . BREAST EXCISIONAL BIOPSY Left 2011  . BREAST EXCISIONAL BIOPSY Right    x2  . BREAST SURGERY     right breast 3 surgeries [phone]  . CARDIAC CATHETERIZATION N/A 09/02/2016   Procedure: Right/Left Heart Cath and Coronary Angiography;  Surgeon: Itzelle Gains M Martinique, MD;  Location: [place] CV LAB;  Service: Cardiovascular;  Laterality: N/A;  . CARDIOVERSION N/A 10/19/2015   Procedure: CARDIOVERSION;  Surgeon: Lelon Perla, MD;  Location: [facility] ENDOSCOPY;  Service: Cardiovascular;  Laterality: N/A;  . CARDIOVERSION N/A 11/13/2017    Procedure: CARDIOVERSION;  Surgeon: Jerline Pain, MD;  Location: [place];  Service: Cardiovascular;  Laterality: N/A;  . [place]  . COLON SURGERY     2004,2007,2013.  3 times colon surgieres  . ESOPHAGOGASTRODUODENOSCOPY (EGD) WITH PROPOFOL Left 03/13/2017   Procedure: ESOPHAGOGASTRODUODENOSCOPY (EGD) WITH PROPOFOL;  Surgeon: Ronnette Juniper, MD;  Location: [place];  Service: Gastroenterology;  Laterality: Left;  . ESOPHAGOGASTRODUODENOSCOPY (EGD) WITH PROPOFOL Left 01/22/2018   Procedure: ESOPHAGOGASTRODUODENOSCOPY (EGD) WITH PROPOFOL;  Surgeon: Arta Silence, MD;  Location[facility] ENDOSCOPY;  Service: Endoscopy;  Laterality: Left;  . EXCISE LE MANDIBULAR LYMPH NODE T     DR C. NEWMAN  . FOOT SURGERY  1989  . GIVENS CAPSULE STUDY Left 01/23/2018   Procedure: GIVENS CAPSULE STUDY;  Surgeon: Wilford Corner, MD;  Location: [facility] ENDOSCOPY;  Service: Endoscopy;  Laterality: Left;  . LEFT SHOULDER ARTHROSCOPY    . 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 NEUROPLASTY /  TRANSPOSITION MEDIAN NERVE AT CARPAL TUNNEL BILATERAL  05/2003  . TEE WITHOUT CARDIOVERSION N/A 10/19/2015   Procedure: Transesophageal Echocardiogram (TEE) ;  Surgeon: Lelon Perla, MD;  Location: [facility] ENDOSCOPY;  Service: Cardiovascular;  Laterality: N/A;  . TEE WITHOUT CARDIOVERSION N/A 10/14/2016   Procedure: TRANSESOPHAGEAL ECHOCARDIOGRAM (TEE);  Surgeon: Sherren Mocha, MD;  Location: [place];  Service: Open Heart Surgery;  Laterality: N/A;  . TOTAL SHOULDER ARTHROPLASTY Right 12/18/2015   Procedure: TOTAL SHOULDER ARTHROPLASTY;  Surgeon: [name], MD;  Location: [place];  Service: Orthopedics;  Laterality: Right;  . TRANSCATHETER AORTIC VALVE REPLACEMENT, TRANSFEMORAL N/A 10/14/2016   Procedure: TRANSCATHETER AORTIC VALVE REPLACEMENT, TRANSFEMORAL;  Surgeon: Sherren Mocha, MD;  Location: [place];  Service: Open Heart Surgery;  Laterality: N/A;  . TRIGGER FINGER RELEASE Left   . VAGINAL CYST REMOVED   1967     Current Outpatient Medications  Medication Sig Dispense Refill  . acetaminophen (TYLENOL) 500 MG tablet Take 500 mg by mouth every 6 (six) hours as needed for mild pain.    Marland Kitchen albuterol (PROVENTIL) (2.5 MG/3ML) 0.083% nebulizer solution USE 1 VIAL IN NEBULIZER EVERY 4 HOURS AND AS NEEDED. Generic: VENTOLIN 60 vial 10  . albuterol (VENTOLIN HFA) 108 (90 Base) MCG/ACT inhaler Inhale 2 puffs into the lungs every 6 (six) hours as needed for wheezing or shortness of breath. 1 Inhaler 11  . amiodarone (PACERONE) 200 MG tablet Take 200 mg by mouth daily.    . cetirizine (ZYRTEC) 10 MG tablet Take 10 mg by mouth daily as needed for allergies.     . cholecalciferol (VITAMIN D) 1000 units tablet Take 1,000 Units by mouth at bedtime.     . colestipol (COLESTID) 1 g tablet Take 1 tablet by mouth 2 (two) times daily.  3  . doxazosin (CARDURA) 4 MG tablet TAKE ONE TABLET BY MOUTH  ONCE DAILY TO HELP CONTROL  BLOOD PRESSURE 90 tablet 1  . ferrous sulfate 325 (65 FE) MG EC tablet Take 325 mg by mouth 2 (two) times daily.    . fluticasone furoate-vilanterol (BREO ELLIPTA) 100-25 MCG/INH AEPB Inhale into the lungs once daily 1 each 5  . furosemide (LASIX) 40 MG tablet Take 2 tablets (80 mg total) by mouth daily. 90 tablet 1  . glimepiride (AMARYL) 2 MG tablet Take one tablet by mouth once daily at breakfast to control glucose 90 tablet 1  . levothyroxine (SYNTHROID, LEVOTHROID) 88 MCG tablet Take 1 tablet (88 mcg total) by mouth daily. 90 tablet 3  . MYRBETRIQ 25 MG TB24 tablet TAKE 1 TABLET BY MOUTH DAILY TO HELP BLADDER CONTROL 90 tablet 1  . pantoprazole (PROTONIX) 40 MG tablet TAKE 1 TABLET BY MOUTH TWO  TIMES DAILY 180 tablet 1  . potassium gluconate 595 (99 K) MG TABS tablet Take 595 mg by mouth See admin instructions. TAKES ONLY WHEN USING FUROSEMIDE    . pravastatin (PRAVACHOL) 20 MG tablet Take 1 tablet (20 mg total) by mouth daily. 90 tablet 1  . triamcinolone (KENALOG) 0.025 % cream APPLY   CREAM TOPICALLY ONCE DAILY AS NEEDED FOR PSORIASIS 30 g 1   No current facility-administered medications for this visit.     Allergies:   Codeine and Vesicare [solifenacin]    Social History:  The patient  reports that she quit smoking about 28 years ago. She has a 40.00 pack-year smoking history. She has never used smokeless tobacco. She reports that she does not drink alcohol or use drugs.  Family History:  The patient's family history includes Arthritis in her sister; Asthma in her sister; Diabetes in her father; Heart disease in her brother and father; Liver cancer in her sister; Stroke in her father.    ROS:  Please see the history of present illness.   Otherwise, review of systems are positive for none.   All other systems are reviewed and negative.    PHYSICAL EXAM: VS:  BP 134/78   Pulse 66   Ht _0  (1.6 m)   Wt 225 lb (102.1 kg)   BMI 39.86 kg/m  , BMI Body mass index is 39.86 kg/m. GENERAL:  Chronically ill appearing obese WF in NAD. Walks with a cane. HEENT:  PERRL, EOMI, sclera are clear. Oropharynx is clear. NECK:  No jugular venous distention, carotid upstroke brisk and symmetric, no bruits, no thyromegaly or adenopathy LUNGS:  Clear to auscultation bilaterally CHEST:  Unremarkable HEART:  RRR,  PMI not displaced or sustained,S1 and S2 within normal limits, no S3, no S4: no clicks, no rubs, gr 2/6 systolic murmur RUSB.  ABD:  Soft, nontender. BS +, no masses or bruits. No hepatomegaly, no splenomegaly EXT:  2 + pulses throughout, no edema, no cyanosis no clubbing SKIN:  Warm and dry.  No rashes NEURO:  Alert and oriented x 3. Cranial nerves II through XII intact. PSYCH:  Cognitively intact       EKG:  EKG is not ordered today.    Recent Labs: Lab Results  Component Value Date   WBC 5.9 04/20/2018   HGB 10.9 (L) 04/20/2018   HCT 34.1 (L) 04/20/2018   PLT 217 04/20/2018   GLUCOSE 182 (H) 04/20/2018   CHOL 198 04/20/2018   TRIG 174 (H) 04/20/2018    HDL 63 04/20/2018   LDLCALC 106 (H) 04/20/2018   ALT 20 04/20/2018   AST 20 04/20/2018   NA 138 04/20/2018   K 4.7 04/20/2018   CL 99 04/20/2018   CREATININE 1.74 (H) 04/20/2018   BUN 37 (H) 04/20/2018   CO2 30 04/20/2018   TSH 6.79 (H) 04/20/2018   INR 1.44 01/20/2018   HGBA1C 7.0 (H) 04/20/2018   MICROALBUR 6.3 06/11/2017      Lipid Panel    Component Value Date/Time   CHOL 198 04/20/2018 0915   CHOL 182 04/28/2016 0940   TRIG 174 (H) 04/20/2018 0915   HDL 63 04/20/2018 0915   HDL 61 04/28/2016 0940   CHOLHDL 3.1 04/20/2018 0915   VLDL 19 06/11/2017 0832   LDLCALC 106 (H) 04/20/2018 0915      Wt Readings from Last 3 Encounters:  05/10/18 225 lb (102.1 kg)  04/21/18 226 lb (102.5 kg)  02/22/18 227 lb 9.6 oz (103.2 kg)      Other studies Reviewed: Additional studies/ records that were reviewed today include:   Echo 10/14/17: Study Conclusions  - Left ventricle: The cavity size was normal. Wall thickness was   increased in a pattern of moderate LVH. Systolic function was   normal. The estimated ejection fraction was in the range of 60%   to 65%. Wall motion was normal; there were no regional wall   motion abnormalities. Diastolic dysfunction with elevated LV   filling pressure. - Aortic valve: s/p TAVR. No obstruction or paravalvular leak. Mean   gradient (S): 24 mm Hg. Peak gradient (S): 47 mm Hg. Valve area   (VTI): 1.19 cm^2. Valve area (Vmax): 1.09 cm^2. Valve area   (Vmean): 1.07 cm^2. - Mitral valve:  Calcified annulus. Mildly thickened leaflets . Mild   stenosis. There was moderate regurgitation. - Left atrium: Severely dilated. - Right atrium: Mildly dilated. - Tricuspid valve: There was mild regurgitation. - Pulmonary arteries: PA peak pressure: 51 mm Hg (S). - Inferior vena cava: The vessel was normal in size. The   respirophasic diameter changes were in the normal range (>= 50%),   consistent with normal central venous  pressure.  Impressions:  - Compared to a prior study in 10/2016, the TAVR valve appears   stable.  Extensive hospital records reviewed as noted above.   ASSESSMENT AND PLAN:  1.  Atrial fibrillation. S/p DCCV in December 2016 and again January 2019. [place] Vasc score of 6. Now on higher dose of amiodarone 200 mg daily. Anticoagulation held due to recurrent GI bleed. Based on the fact she has 3 GI bleeding episodes in the past year I do not think she is a candidate for long term anticoagulation. We did discuss a  LA occlusive device like Watchmans but even this would entail at least a few weeks of anticoagulation or DAPT and she does not want to pursue this. Will try and maintain NSR with amiodarone.   2.  Severe Aortic stenosis. Echo in July 2017 showed progression with velocity > 4 cm/sec and mean gradient of 40 mm Hg. Now s/p TAVR in December 2017. Follow up Echo 12/18 with higher than expected gradient (mean gradient of 29 mm Hg) due to patient/ prosthetic mismatch with small annuls size and vigorous LV function. Exam is stable.  3. Anemia Fe deficient with GI blood loss secondary to AVMS and erosive gastropathy. GI work up as noted. Risk of recurrent bleeding is high. no anticoagulation or antiplatelet therapy  4. Chronic kidney disease stage 3.   5. S/p right shoulder arthroplasty.   6. Acute on Chronic diastolic CHF. Weight has decreased and no edema today.   Would continue higher dose of lasix 80 mg daily.  7. HTN- BP is controlled.  8. DM type 2. A1c 7%. Managed by primary care.  9. COPD. On inhaler therapy per pulmonary.   I will follow up in 4 months.   Signed, Kie Calvin Martinique, MD  05/10/2018 8:53 AM    [facility] [address], [place], [place], [address] Phone [phone], Fax [phone]

## 2018-05-10 ENCOUNTER — Encounter: Payer: Self-pay | Admitting: Cardiology

## 2018-05-10 ENCOUNTER — Ambulatory Visit (INDEPENDENT_AMBULATORY_CARE_PROVIDER_SITE_OTHER): Payer: Medicare Other | Admitting: Cardiology

## 2018-05-10 VITALS — BP 134/78 | HR 66 | Ht 63.0 in | Wt 225.0 lb

## 2018-05-10 DIAGNOSIS — I5032 Chronic diastolic (congestive) heart failure: Secondary | ICD-10-CM | POA: Diagnosis not present

## 2018-05-10 DIAGNOSIS — I480 Paroxysmal atrial fibrillation: Secondary | ICD-10-CM | POA: Diagnosis not present

## 2018-05-10 DIAGNOSIS — J449 Chronic obstructive pulmonary disease, unspecified: Secondary | ICD-10-CM | POA: Diagnosis not present

## 2018-05-10 DIAGNOSIS — N189 Chronic kidney disease, unspecified: Secondary | ICD-10-CM | POA: Diagnosis not present

## 2018-05-10 DIAGNOSIS — Z952 Presence of prosthetic heart valve: Secondary | ICD-10-CM | POA: Diagnosis not present

## 2018-05-10 NOTE — Patient Instructions (Signed)
Continue your current therapy  I will see you in 4 months

## 2018-05-16 ENCOUNTER — Other Ambulatory Visit: Payer: Self-pay | Admitting: Nurse Practitioner

## 2018-05-16 DIAGNOSIS — R195 Other fecal abnormalities: Secondary | ICD-10-CM

## 2018-06-15 DIAGNOSIS — H5201 Hypermetropia, right eye: Secondary | ICD-10-CM | POA: Diagnosis not present

## 2018-06-15 DIAGNOSIS — E113293 Type 2 diabetes mellitus with mild nonproliferative diabetic retinopathy without macular edema, bilateral: Secondary | ICD-10-CM | POA: Diagnosis not present

## 2018-06-24 ENCOUNTER — Other Ambulatory Visit (HOSPITAL_COMMUNITY): Payer: Self-pay | Admitting: *Deleted

## 2018-06-24 ENCOUNTER — Inpatient Hospital Stay (HOSPITAL_COMMUNITY): Admission: RE | Admit: 2018-06-24 | Payer: Medicare Other | Source: Ambulatory Visit

## 2018-06-25 ENCOUNTER — Ambulatory Visit (HOSPITAL_COMMUNITY)
Admission: RE | Admit: 2018-06-25 | Discharge: 2018-06-25 | Disposition: A | Discharge status: Home / Self Care | Payer: Medicare Other | Source: Ambulatory Visit | Attending: Gastroenterology | Admitting: Gastroenterology

## 2018-06-25 DIAGNOSIS — D500 Iron deficiency anemia secondary to blood loss (chronic): Secondary | ICD-10-CM | POA: Insufficient documentation

## 2018-06-25 MED ORDER — SODIUM CHLORIDE 0.9 % IV SOLN
200.0000 mg | Freq: Once | INTRAVENOUS | Status: AC
  Administered 2018-06-25: 200 mg via INTRAVENOUS
  Filled 2018-06-25: qty 10

## 2018-07-01 ENCOUNTER — Other Ambulatory Visit: Payer: Self-pay | Admitting: Nurse Practitioner

## 2018-07-22 DIAGNOSIS — D509 Iron deficiency anemia, unspecified: Secondary | ICD-10-CM | POA: Diagnosis not present

## 2018-08-06 ENCOUNTER — Other Ambulatory Visit: Payer: Self-pay | Admitting: Primary Care

## 2018-08-06 ENCOUNTER — Encounter: Payer: Self-pay | Admitting: Primary Care

## 2018-08-06 ENCOUNTER — Telehealth: Payer: Self-pay | Admitting: *Deleted

## 2018-08-06 ENCOUNTER — Ambulatory Visit (INDEPENDENT_AMBULATORY_CARE_PROVIDER_SITE_OTHER): Payer: Medicare Other | Admitting: Primary Care

## 2018-08-06 ENCOUNTER — Other Ambulatory Visit (INDEPENDENT_AMBULATORY_CARE_PROVIDER_SITE_OTHER): Payer: Medicare Other

## 2018-08-06 VITALS — BP 118/74 | HR 69 | Temp 97.6°F | Ht 63.0 in | Wt 230.0 lb

## 2018-08-06 DIAGNOSIS — R0609 Other forms of dyspnea: Secondary | ICD-10-CM

## 2018-08-06 DIAGNOSIS — I5033 Acute on chronic diastolic (congestive) heart failure: Secondary | ICD-10-CM | POA: Diagnosis not present

## 2018-08-06 DIAGNOSIS — R0600 Dyspnea, unspecified: Secondary | ICD-10-CM

## 2018-08-06 LAB — CBC
HCT: 32.1 % — ABNORMAL LOW (ref 36.0–46.0)
Hemoglobin: 10.6 g/dL — ABNORMAL LOW (ref 12.0–15.0)
MCHC: 32.9 g/dL (ref 30.0–36.0)
MCV: 89.1 fl (ref 78.0–100.0)
Platelets: 197 10*3/uL (ref 150.0–400.0)
RBC: 3.6 Mil/uL — ABNORMAL LOW (ref 3.87–5.11)
RDW: 17.7 % — ABNORMAL HIGH (ref 11.5–15.5)
WBC: 5.5 10*3/uL (ref 4.0–10.5)

## 2018-08-06 LAB — BASIC METABOLIC PANEL
BUN: 35 mg/dL — ABNORMAL HIGH (ref 6–23)
CO2: 31 mEq/L (ref 19–32)
Calcium: 9 mg/dL (ref 8.4–10.5)
Chloride: 100 mEq/L (ref 96–112)
Creatinine, Ser: 1.68 mg/dL — ABNORMAL HIGH (ref 0.40–1.20)
GFR: 31.16 mL/min — ABNORMAL LOW (ref >60.00)
Glucose, Bld: 173 mg/dL — ABNORMAL HIGH (ref 70–99)
Potassium: 4.4 mEq/L (ref 3.5–5.1)
Sodium: 140 mEq/L (ref 135–145)

## 2018-08-06 LAB — BRAIN NATRIURETIC PEPTIDE: Pro B Natriuretic peptide (BNP): 547 pg/mL — ABNORMAL HIGH (ref 0.0–100.0)

## 2018-08-06 MED ORDER — PREDNISONE 10 MG PO TABS: ORAL_TABLET | ORAL | 0 refills | Status: DC

## 2018-08-06 MED ORDER — FLUTICASONE FUROATE-VILANTEROL 200-25 MCG/INH IN AEPB: 1.0000 | INHALATION_SPRAY | Freq: Every day | RESPIRATORY_TRACT | 0 refills | Status: DC

## 2018-08-06 NOTE — Assessment & Plan Note (Addendum)
-  No symptoms of GIB - Hgb stable at 10.6 (10.9 in July)

## 2018-08-06 NOTE — Progress Notes (Signed)
_0  ID: Patricia [name], female    DOB: 1936/03/10, 82 y.o.   MRN: [id number]  Chief Complaint  Patient presents with  . Acute Visit    SOB, cough, chest tightness and wheezing x3 weeks    Referring provider: [name], NP  HPI: 82 year old female, former smoker (35 pack year hx). PMH COPD, acute on chronic resp failure, obesity hypoventilation syndrome, aortic stenosis, s/p TAVR in 2017, hypertension, afib, diastolic heart failure, GIB. Patient of Dr. Chase Caller, last seen in April 2019 for routine visit. Maintained on BREO.   08/06/2018 Patient presents today for acute visit with complaints with shortness of breath. Called patient to have labs drawn prior to apt today. Takes 43m lasix daily, occasionally she cant take 2 tabs d/t schedule (on average she takes lower 4110mdose about 2 days a week). Experiencing sob, wheezing and dry cough for the last 3 weeks. Cough when she lays down. O2 sat 89% on RA, 93% with deep breaths. Has responded to oral steroids in the past. Pulled her back recently. Denies chest or pleuritic pain.    Spirometry 08/06/2018 FVC 1.3 (51%), FEV1 0.9 (47%), ratio 69    Allergies  Allergen Reactions  . Codeine Other (See Comments)    "Spaces out" the patient and she cannot walk well  . Vesicare [Solifenacin] Itching    Immunization History  Administered Date(s) Administered  . DTaP 08/03/2003  . Influenza, High Dose Seasonal PF 06/15/2017  . Influenza,inj,Quad PF,6+ Mos 09/29/2013, 07/19/2014, 09/26/2015, 09/13/2016  . Influenza-Unspecified 11/04/2012  . Pneumococcal Conjugate-13 09/26/2015  . Pneumococcal Polysaccharide-23 12/01/1993, 08/19/2017    Past Medical History:  Diagnosis Date  . Allergy   . Anemia, unspecified   . Arthritis   . Asthma   . [facility]   s/p TEE/DCCV>>SR on amio  . Benign neoplasm of colon   . Carpal tunnel syndrome   . Cellulitis and abscess of finger,  unspecified   . Cerumen impaction   . Cervicalgia   . CHF (congestive heart failure) ([facility]  . Chronic airway obstruction, not elsewhere classified   . Chronic anticoagulation 09/2015   Eliquis  . Diarrhea   . Diffuse cystic mastopathy   . Edema   . Encounter for long-term (current) use of other medications   . GERD (gastroesophageal reflux disease)   . Heart murmur   . Lumbago   . Neck pain 05/23/2015  . Obesity, unspecified   . Other and unspecified hyperlipidemia   . Other psoriasis   . Pain in joint, lower leg   . PONV (postoperative nausea and vomiting)   . Primary localized osteoarthrosis of right shoulder 12/18/2015  . 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 Scoliosis (and kyphoscoliosis), idiopathic   . Shortness of breath dyspnea    with exertion  . Type II or unspecified type diabetes mellitus without mention of complication, uncontrolled   . Unspecified essential hypertension   . Unspecified hemorrhoids without mention of complication   . Unspecified hypothyroidism   . Urge incontinence     Tobacco History: Social History   Tobacco Use  Smoking Status Former Smoker  . Packs/day: 1.00  . Years: 40.00  . Pack years: 40.00  . Last attempt to quit: 09/07/1989  . Years since quitting: 28.9  Smokeless Tobacco Never Used   Counseling given: Not Answered  Outpatient Medications Prior to Visit  Medication Sig Dispense Refill  . acetaminophen (TYLENOL) 500 MG tablet Take 500 mg by mouth every 6 (six) hours as needed for mild pain.    Marland Kitchen albuterol (PROVENTIL) (2.5 MG/3ML) 0.083% nebulizer solution USE 1 VIAL IN NEBULIZER EVERY 4 HOURS AND AS NEEDED. Generic: VENTOLIN 60 vial 10  . albuterol (VENTOLIN HFA) 108 (90 Base) MCG/ACT inhaler Inhale 2 puffs into the lungs every 6 (six) hours as needed for wheezing or shortness of breath. 1 Inhaler 11    . amiodarone (PACERONE) 200 MG tablet Take 200 mg by mouth daily.    . cetirizine (ZYRTEC) 10 MG tablet Take 10 mg by mouth daily as needed for allergies.     . cholecalciferol (VITAMIN D) 1000 units tablet Take 1,000 Units by mouth at bedtime.     . colestipol (COLESTID) 1 g tablet Take 1 tablet by mouth 2 (two) times daily.  3  . doxazosin (CARDURA) 4 MG tablet TAKE ONE TABLET BY MOUTH  ONCE DAILY TO HELP CONTROL  BLOOD PRESSURE 90 tablet 1  . ferrous sulfate 325 (65 FE) MG EC tablet Take 325 mg by mouth 2 (two) times daily.    . fluticasone furoate-vilanterol (BREO ELLIPTA) 100-25 MCG/INH AEPB Inhale into the lungs once daily 1 each 5  . furosemide (LASIX) 40 MG tablet Take 2 tablets (80 mg total) by mouth daily. 90 tablet 1  . glimepiride (AMARYL) 2 MG tablet Take one tablet by mouth once daily at breakfast to control glucose 90 tablet 1  . levothyroxine (SYNTHROID, LEVOTHROID) 88 MCG tablet Take 1 tablet (88 mcg total) by mouth daily. 90 tablet 3  . MYRBETRIQ 25 MG TB24 tablet TAKE 1 TABLET BY MOUTH DAILY TO HELP BLADDER CONTROL 90 tablet 1  . pantoprazole (PROTONIX) 40 MG tablet TAKE 1 TABLET BY MOUTH TWO  TIMES DAILY 180 tablet 1  . potassium gluconate 595 (99 K) MG TABS tablet Take 595 mg by mouth See admin instructions. TAKES ONLY WHEN USING FUROSEMIDE    . pravastatin (PRAVACHOL) 20 MG tablet TAKE 1 TABLET(20 MG) BY MOUTH DAILY 90 tablet 0  . triamcinolone (KENALOG) 0.025 % cream APPLY  CREAM TOPICALLY ONCE DAILY AS NEEDED FOR PSORIASIS 30 g 1   No facility-administered medications prior to visit.     Review of Systems  Review of Systems  Constitutional: Negative.   HENT: Negative.   Respiratory: Positive for cough, shortness of breath and wheezing.   Cardiovascular: Positive for leg swelling. Negative for chest pain and palpitations.  Gastrointestinal: Positive for abdominal distention. Negative for abdominal pain and blood in stool.   Physical Exam  BP 118/74 (BP Location:  Left Arm, Cuff Size: Normal)   Pulse 69   Temp 97.6 F (36.4 C)   Ht _0  (1.6 m)   Wt 230 lb (104.3 kg)   SpO2 93%   BMI 40.74 kg/m  Physical Exam  Constitutional: She is oriented to person, place, and time. She appears well-developed and well-nourished. No distress.  HENT:  Head: Normocephalic and atraumatic.  Eyes: Pupils are equal, round, and reactive to light. EOM are normal.  Neck: Normal range of motion. Neck supple.  Cardiovascular: Regular rhythm.  No edema   Pulmonary/Chest: Effort normal. She has no wheezes.  Diminished, crackles left base. No resp distress or accessory use. O2 sat 93% RA  Neurological: She is alert and oriented to person, place, and time.  Skin: Skin is warm and dry.  Psychiatric:  She has a normal mood and affect. Her behavior is normal. Judgment and thought content normal.     Lab Results:  CBC    Component Value Date/Time   WBC 5.5 08/06/2018 1006   RBC 3.60 (L) 08/06/2018 1006   HGB 10.6 (L) 08/06/2018 1006   HGB 9.7 (L) 10/14/2017 1545   HCT 32.1 (L) 08/06/2018 1006   HCT 31.2 (L) 10/14/2017 1545   PLT 197.0 08/06/2018 1006   PLT 220 10/14/2017 1545   MCV 89.1 08/06/2018 1006   MCV 87 10/14/2017 1545   MCH 27.7 04/20/2018 0915   MCHC 32.9 08/06/2018 1006   RDW 17.7 (H) 08/06/2018 1006   RDW 17.5 (H) 10/14/2017 1545   LYMPHSABS 1,162 04/20/2018 0915   LYMPHSABS 2.0 04/28/2016 0940   MONOABS 0.6 02/22/2018 1057   EOSABS 112 04/20/2018 0915   EOSABS 0.2 04/28/2016 0940   BASOSABS 41 04/20/2018 0915   BASOSABS 0.0 04/28/2016 0940    BMET    Component Value Date/Time   NA 140 08/06/2018 1006   NA 140 11/18/2017 0830   K 4.4 08/06/2018 1006   CL 100 08/06/2018 1006   CO2 31 08/06/2018 1006   GLUCOSE 173 (H) 08/06/2018 1006   BUN 35 (H) 08/06/2018 1006   BUN 30 (H) 11/18/2017 0830   CREATININE 1.68 (H) 08/06/2018 1006   CREATININE 1.74 (H) 04/20/2018 0915   CALCIUM 9.0 08/06/2018 1006   GFRNONAA 27 (L) 04/20/2018 0915    GFRAA 32 (L) 04/20/2018 0915    BNP    Component Value Date/Time   BNP 634.9 (H) 11/18/2017 0830   BNP 839.5 (H) 11/12/2017 1828    ProBNP    Component Value Date/Time   PROBNP 547.0 (H) 08/06/2018 1006    Imaging: No results found.   Assessment & Plan:   Acute on chronic diastolic heart failure (HCC) - BNP 547  - Take an additional tab of lasix for 3 days, repeat labs in 5 days  - Will check CXR  - Instructed patient to contact cardiology, has an apt 11/18 with Dr. [name] Martinique  - FU in 5 days   COPD (chronic obstructive pulmonary disease) ([place]) Hx mixed disease Maintained on Breo 100 (has been on Advair in the past) Spirometry 08/06/2018- FVC 1.3 (51%), FEV1 0.9 (47%), ratio 69   - Mild exacerbation - Plan increase BREO to 200 dose (sample given)  - Prednisone x 5 days (monitor BS) - FU in 5 days   GI bleed - No symptoms of GIB - Hgb stable at 10.6 (10.9 in July)     Martyn Ehrich, NP 08/06/2018

## 2018-08-06 NOTE — Assessment & Plan Note (Addendum)
-  BNP 547  - Take an additional tab of lasix for 3 days, repeat labs in 5 days  - Will check CXR  - Instructed patient to contact cardiology, has an apt 11/18 with Dr. [name] Martinique  - FU in 5 days

## 2018-08-06 NOTE — Assessment & Plan Note (Addendum)
Hx mixed disease Maintained on Breo 100 (has been on Advair in the past) Spirometry 08/06/2018- FVC 1.3 (51%), FEV1 0.9 (47%), ratio 69   - Mild exacerbation - Plan increase BREO to 200 dose (sample given)  - Prednisone x 5 days (monitor BS) - FU in 5 days

## 2018-08-06 NOTE — Telephone Encounter (Signed)
Patient called and stated that she had some expired pain medication given to her about 3 years ago and wanted to know if she could take it for a pulled muscle. She wanted to know if it would be safe to take with her heart medication. Pain medication is not in patient's current medication list. Advised patient NOT to take expired Pain medication. She stated that she would call her Cardiologist.

## 2018-08-06 NOTE — Patient Instructions (Addendum)
Congestive heart failure: - BNP was 596, which is moderately elevated (indicates that you may be holding on to some extra fluid) - Take an additional tab of lasix for 3 days, repeat labs in 5 days  - Please contact your cardiologist  - Will check CXR today  COPD/asthma - Changing BREO to 200 dose- still take 1 puff daily  - Short course of prednisone 9m x 5 days   Anemia: Hemoglobin was 10.6 which is stable (10.9 in July)  Follow-up 5 days with BEustaquio Maize NP with labs prior

## 2018-08-12 ENCOUNTER — Ambulatory Visit (INDEPENDENT_AMBULATORY_CARE_PROVIDER_SITE_OTHER): Payer: Medicare Other | Admitting: Primary Care

## 2018-08-12 ENCOUNTER — Other Ambulatory Visit: Payer: Self-pay

## 2018-08-12 ENCOUNTER — Other Ambulatory Visit (INDEPENDENT_AMBULATORY_CARE_PROVIDER_SITE_OTHER): Payer: Medicare Other

## 2018-08-12 ENCOUNTER — Ambulatory Visit (INDEPENDENT_AMBULATORY_CARE_PROVIDER_SITE_OTHER)
Admission: RE | Admit: 2018-08-12 | Discharge: 2018-08-12 | Disposition: A | Discharge status: Home / Self Care | Payer: Medicare Other | Source: Ambulatory Visit | Attending: Primary Care | Admitting: Primary Care

## 2018-08-12 ENCOUNTER — Encounter: Payer: Self-pay | Admitting: Primary Care

## 2018-08-12 DIAGNOSIS — I5033 Acute on chronic diastolic (congestive) heart failure: Secondary | ICD-10-CM | POA: Diagnosis not present

## 2018-08-12 DIAGNOSIS — R0600 Dyspnea, unspecified: Secondary | ICD-10-CM

## 2018-08-12 DIAGNOSIS — R0609 Other forms of dyspnea: Secondary | ICD-10-CM | POA: Diagnosis not present

## 2018-08-12 DIAGNOSIS — R0602 Shortness of breath: Secondary | ICD-10-CM | POA: Diagnosis not present

## 2018-08-12 LAB — BASIC METABOLIC PANEL
BUN: 51 mg/dL — ABNORMAL HIGH (ref 6–23)
CO2: 31 mEq/L (ref 19–32)
Calcium: 9.5 mg/dL (ref 8.4–10.5)
Chloride: 96 mEq/L (ref 96–112)
Creatinine, Ser: 1.69 mg/dL — ABNORMAL HIGH (ref 0.40–1.20)
GFR: 30.95 mL/min — ABNORMAL LOW (ref >60.00)
Glucose, Bld: 210 mg/dL — ABNORMAL HIGH (ref 70–99)
Potassium: 4 mEq/L (ref 3.5–5.1)
Sodium: 139 mEq/L (ref 135–145)

## 2018-08-12 LAB — BRAIN NATRIURETIC PEPTIDE: Pro B Natriuretic peptide (BNP): 312 pg/mL — ABNORMAL HIGH (ref 0.0–100.0)

## 2018-08-12 MED ORDER — FLUTICASONE FUROATE-VILANTEROL 200-25 MCG/INH IN AEPB: 1.0000 | INHALATION_SPRAY | Freq: Every day | RESPIRATORY_TRACT | 5 refills | Status: DC

## 2018-08-12 MED ORDER — FLUTICASONE FUROATE-VILANTEROL 200-25 MCG/INH IN AEPB: 1.0000 | INHALATION_SPRAY | Freq: Every day | RESPIRATORY_TRACT | 0 refills | Status: DC

## 2018-08-12 NOTE — Progress Notes (Signed)
_0  ID: Patricia [name], female    DOB: 03/23/1936, 82 y.o.   MRN: [id number]  Chief Complaint  Patient presents with  . Follow-up    breathing better    Referring provider: [name], NP  HPI: 82 year old female, former smoker (35 pack year hx). PMH COPD, acute on chronic resp failure, obesity hypoventilation syndrome, aortic stenosis, s/p TAVR in 2017, hypertension, afib, diastolic heart failure, GIB. Patient of Dr. Chase Caller, last seen in April 2019 for routine visit. Maintained on BREO 100.   08/06/2018 Patient presents today for acute visit with complaints with shortness of breath. Called patient to have labs drawn prior to apt today. Takes 52m lasix daily, occasionally she cant take 2 tabs d/t schedule (on average she takes lower 475mdose about 2 days a week). Experiencing sob, wheezing and dry cough for the last 3 weeks. Cough when she lays down. O2 sat 89% on RA, 93% with deep breaths. Has responded to oral steroids in the past. Pulled her back recently. Denies chest or pleuritic pain.    Acute CHF exacerbation. BNP 547, instructed to take additional tab of lasix for 3 days, repeat labs in 5 days. Has an apt with cardiology on 11/18 with Dr. [name] JoMartiniqueSpirometry 08/06/2018- FVC 1.3 (51%), FEV1 0.9 (47%), ratio 69. Increased Breo to 200. Completed prednisone x5 days.   08/12/2018  Patient presents today for 1 week follow-up for dyspnea. States that she's breathing better. Reports that she is down 5 lbs. Denies sob, orthopnea, wheezing or cough. CXR showed COPD, cardiomegaly with interstitial prominence, likely early interstitial edema. Small effusions and bibasilar atelectasis. BNP improved.    Allergies  Allergen Reactions  . Codeine Other (See Comments)    "Spaces out" the patient and she cannot walk well  . Vesicare [Solifenacin] Itching    Immunization History  Administered Date(s) Administered  . DTaP 08/03/2003  . Influenza, High Dose Seasonal PF  06/15/2017  . Influenza,inj,Quad PF,6+ Mos 09/29/2013, 07/19/2014, 09/26/2015, 09/13/2016  . Influenza-Unspecified 11/04/2012  . Pneumococcal Conjugate-13 09/26/2015  . Pneumococcal Polysaccharide-23 12/01/1993, 08/19/2017    Past Medical History:  Diagnosis Date  . Allergy   . Anemia, unspecified   . Arthritis   . Asthma   . [facility]   s/p TEE/DCCV>>SR on amio  . Benign neoplasm of colon   . Carpal tunnel syndrome   . Cellulitis and abscess of finger, unspecified   . Cerumen impaction   . Cervicalgia   . [place]  . Chronic airway obstruction, not elsewhere classified   . Chronic anticoagulation 09/2015   Eliquis  . Diarrhea   . Diffuse cystic mastopathy   . Edema   . Encounter for long-term (current) use of other medications   . GERD (gastroesophageal reflux disease)   . Heart murmur   . Lumbago   . Neck pain 05/23/2015  . Obesity, unspecified   . Other and unspecified hyperlipidemia   . Other psoriasis   . Pain in joint, lower leg   . PONV (postoperative nausea and vomiting)   . Primary localized osteoarthrosis of right shoulder 12/18/2015  . 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 Scoliosis (and kyphoscoliosis), idiopathic   . Shortness of breath dyspnea    with exertion  . Type II or unspecified type diabetes mellitus without mention of  complication, uncontrolled   . Unspecified essential hypertension   . Unspecified hemorrhoids without mention of complication   . Unspecified hypothyroidism   . Urge incontinence     Tobacco History: Social History   Tobacco Use  Smoking Status Former Smoker  . Packs/day: 1.00  . Years: 40.00  . Pack years: 40.00  . Last attempt to quit: 09/07/1989  . Years since quitting: 28.9  Smokeless Tobacco Never Used    Counseling given: Not Answered   Outpatient Medications Prior to Visit  Medication Sig Dispense Refill  . acetaminophen (TYLENOL) 500 MG tablet Take 500 mg by mouth every 6 (six) hours as needed for mild pain.    Marland Kitchen albuterol (PROVENTIL) (2.5 MG/3ML) 0.083% nebulizer solution USE 1 VIAL IN NEBULIZER EVERY 4 HOURS AND AS NEEDED. Generic: VENTOLIN 60 vial 10  . albuterol (VENTOLIN HFA) 108 (90 Base) MCG/ACT inhaler Inhale 2 puffs into the lungs every 6 (six) hours as needed for wheezing or shortness of breath. 1 Inhaler 11  . amiodarone (PACERONE) 200 MG tablet Take 200 mg by mouth daily.    . baclofen (LIORESAL) 10 MG tablet Take 10 mg by mouth 2 (two) times daily.    . cetirizine (ZYRTEC) 10 MG tablet Take 10 mg by mouth daily as needed for allergies.     . cholecalciferol (VITAMIN D) 1000 units tablet Take 1,000 Units by mouth at bedtime.     . colestipol (COLESTID) 1 g tablet Take 1 tablet by mouth 2 (two) times daily.  3  . doxazosin (CARDURA) 4 MG tablet TAKE ONE TABLET BY MOUTH  ONCE DAILY TO HELP CONTROL  BLOOD PRESSURE 90 tablet 1  . ferrous sulfate 325 (65 FE) MG EC tablet Take 325 mg by mouth 2 (two) times daily.    . furosemide (LASIX) 40 MG tablet Take 2 tablets (80 mg total) by mouth daily. 90 tablet 1  . glimepiride (AMARYL) 2 MG tablet Take one tablet by mouth once daily at breakfast to control glucose 90 tablet 1  . levothyroxine (SYNTHROID, LEVOTHROID) 88 MCG tablet Take 1 tablet (88 mcg total) by mouth daily. 90 tablet 3  . MYRBETRIQ 25 MG TB24 tablet TAKE 1 TABLET BY MOUTH DAILY TO HELP BLADDER CONTROL 90 tablet 1  . pantoprazole (PROTONIX) 40 MG tablet TAKE 1 TABLET BY MOUTH TWO  TIMES DAILY 180 tablet 1  . potassium gluconate 595 (99 K) MG TABS tablet Take 595 mg by mouth See admin instructions. TAKES ONLY WHEN USING FUROSEMIDE    . pravastatin (PRAVACHOL) 20 MG tablet TAKE 1 TABLET(20 MG) BY MOUTH DAILY 90 tablet 0  . predniSONE (DELTASONE) 10 MG tablet Take 2 tabs x 5  days 10 tablet 0  . triamcinolone (KENALOG) 0.025 % cream APPLY  CREAM TOPICALLY ONCE DAILY AS NEEDED FOR PSORIASIS 30 g 1  . fluticasone furoate-vilanterol (BREO ELLIPTA) 100-25 MCG/INH AEPB Inhale into the lungs once daily 1 each 5  . fluticasone furoate-vilanterol (BREO ELLIPTA) 200-25 MCG/INH AEPB Inhale 1 puff into the lungs daily. 28 each 0   No facility-administered medications prior to visit.     Review of Systems  Review of Systems  Constitutional: Negative.   HENT: Negative.   Respiratory: Negative for cough, shortness of breath and wheezing.   Cardiovascular: Negative.   Skin: Negative.    Physical Exam  BP (!) 150/64 (BP Location: Right Arm, Cuff Size: Normal)   Pulse 64   Temp (!) 97.5 F (36.4 C)   Ht 5'  3" (1.6 m)   Wt 225 lb (102.1 kg)   SpO2 94%   BMI 39.86 kg/m  Physical Exam  Constitutional: She is oriented to person, place, and time. She appears well-developed and well-nourished.  HENT:  Head: Normocephalic and atraumatic.  Eyes: Pupils are equal, round, and reactive to light. EOM are normal.  Neck: Normal range of motion. Neck supple.  Cardiovascular: Normal rate, regular rhythm and normal heart sounds.  No murmur heard. Pulmonary/Chest: Effort normal and breath sounds normal. No respiratory distress. She has no wheezes.  Abdominal: Soft. Bowel sounds are normal. There is no tenderness.  Neurological: She is alert and oriented to person, place, and time.  Skin: Skin is warm and dry. No rash noted. No erythema.  Psychiatric: She has a normal mood and affect. Her behavior is normal. Judgment normal.     Lab Results:  CBC    Component Value Date/Time   WBC 5.5 08/06/2018 1006   RBC 3.60 (L) 08/06/2018 1006   HGB 10.6 (L) 08/06/2018 1006   HGB 9.7 (L) 10/14/2017 1545   HCT 32.1 (L) 08/06/2018 1006   HCT 31.2 (L) 10/14/2017 1545   PLT 197.0 08/06/2018 1006   PLT 220 10/14/2017 1545   MCV 89.1 08/06/2018 1006   MCV 87 10/14/2017 1545   MCH 27.7  04/20/2018 0915   MCHC 32.9 08/06/2018 1006   RDW 17.7 (H) 08/06/2018 1006   RDW 17.5 (H) 10/14/2017 1545   LYMPHSABS 1,162 04/20/2018 0915   LYMPHSABS 2.0 04/28/2016 0940   MONOABS 0.6 02/22/2018 1057   EOSABS 112 04/20/2018 0915   EOSABS 0.2 04/28/2016 0940   BASOSABS 41 04/20/2018 0915   BASOSABS 0.0 04/28/2016 0940    BMET    Component Value Date/Time   NA 139 08/12/2018 1007   NA 140 11/18/2017 0830   K 4.0 08/12/2018 1007   CL 96 08/12/2018 1007   CO2 31 08/12/2018 1007   GLUCOSE 210 (H) 08/12/2018 1007   BUN 51 (H) 08/12/2018 1007   BUN 30 (H) 11/18/2017 0830   CREATININE 1.69 (H) 08/12/2018 1007   CREATININE 1.74 (H) 04/20/2018 0915   CALCIUM 9.5 08/12/2018 1007   GFRNONAA 27 (L) 04/20/2018 0915   GFRAA 32 (L) 04/20/2018 0915    BNP    Component Value Date/Time   BNP 634.9 (H) 11/18/2017 0830   BNP 839.5 (H) 11/12/2017 1828    ProBNP    Component Value Date/Time   PROBNP 312.0 (H) 08/12/2018 1007    Imaging: Dg Chest 2 View  Result Date: 08/12/2018 CLINICAL DATA:  Shortness of Breath EXAM: CHEST - 2 VIEW COMPARISON:  01/20/2018 FINDINGS: Cardiomegaly. There is hyperinflation of the lungs compatible with COPD. Diffuse interstitial prominence through the lungs could reflect early interstitial edema. Small bilateral effusions and bibasilar atelectasis. IMPRESSION: COPD. Cardiomegaly with interstitial prominence, likely early interstitial edema. Small effusions and bibasilar atelectasis. Electronically Signed   By: Rolm Baptise M.D.   On: 08/12/2018 10:41     Assessment & Plan:   Acute on chronic diastolic heart failure (HCC) - Improved with additional 66m lasix x 3 days - Repeat BNP 312 (547) - Instructed to continue lasix 822mdaily - Monitor weight  - Cardiology apt in November   COPD (chronic obstructive pulmonary disease) (HCC) - Continue Breo Ellipta 200-25, 1 puff daily - Follow up on 3-4 months or sooner if develops worsening  sob/wheezing   ElMartyn EhrichNP 08/12/2018

## 2018-08-12 NOTE — Assessment & Plan Note (Signed)
-  Improved with additional 51m lasix x 3 days - Repeat BNP 312 (547) - Instructed to continue lasix 859mdaily - Monitor weight  - Cardiology apt in November

## 2018-08-12 NOTE — Patient Instructions (Signed)
Thanks for coming in today, you looked well  Happy birthday!!!  Will call with lab results   Return back to daily lasix 58m   Continue Breo 200- 1 puff daily (sample given and RX sent) Albuterol rescue inhaler every 4-6 hours as needed for shortness of breath/wheezing   FU in 4 months with Dr. RChase Caller

## 2018-08-12 NOTE — Assessment & Plan Note (Signed)
-  Continue Breo Ellipta 200-25, 1 puff daily - Follow up on 3-4 months or sooner if develops worsening sob/wheezing

## 2018-08-16 ENCOUNTER — Other Ambulatory Visit: Payer: Medicare Other

## 2018-08-16 DIAGNOSIS — E785 Hyperlipidemia, unspecified: Secondary | ICD-10-CM | POA: Diagnosis not present

## 2018-08-16 DIAGNOSIS — N183 Chronic kidney disease, stage 3 unspecified: Secondary | ICD-10-CM

## 2018-08-16 DIAGNOSIS — D500 Iron deficiency anemia secondary to blood loss (chronic): Secondary | ICD-10-CM

## 2018-08-16 DIAGNOSIS — E039 Hypothyroidism, unspecified: Secondary | ICD-10-CM | POA: Diagnosis not present

## 2018-08-16 DIAGNOSIS — E119 Type 2 diabetes mellitus without complications: Secondary | ICD-10-CM | POA: Diagnosis not present

## 2018-08-18 LAB — CBC WITH DIFFERENTIAL/PLATELET
Basophils Absolute: 61 cells/uL (ref 0–200)
Basophils Relative: 1.1 %
Eosinophils Absolute: 138 cells/uL (ref 15–500)
Eosinophils Relative: 2.5 %
HCT: 34.6 % — ABNORMAL LOW (ref 35.0–45.0)
Hemoglobin: 11.3 g/dL — ABNORMAL LOW (ref 11.7–15.5)
Lymphs Abs: 1023 cells/uL (ref 850–3900)
MCH: 28.5 pg (ref 27.0–33.0)
MCHC: 32.7 g/dL (ref 32.0–36.0)
MCV: 87.4 fL (ref 80.0–100.0)
MPV: 11.7 fL (ref 7.5–12.5)
Monocytes Relative: 9 %
Neutro Abs: 3784 cells/uL (ref 1500–7800)
Neutrophils Relative %: 68.8 %
Platelets: 196 10*3/uL (ref 140–400)
RBC: 3.96 10*6/uL (ref 3.80–5.10)
RDW: 15.1 % — ABNORMAL HIGH (ref 11.0–15.0)
Total Lymphocyte: 18.6 %
WBC mixed population: 495 cells/uL (ref 200–950)
WBC: 5.5 10*3/uL (ref 3.8–10.8)

## 2018-08-18 LAB — LIPID PANEL
Cholesterol: 182 mg/dL (ref <200)
HDL: 58 mg/dL (ref >50)
LDL Cholesterol (Calc): 98 mg/dL (calc)
Non-HDL Cholesterol (Calc): 124 mg/dL (calc) (ref <130)
Total CHOL/HDL Ratio: 3.1 (calc) (ref <5.0)
Triglycerides: 157 mg/dL — ABNORMAL HIGH (ref <150)

## 2018-08-18 LAB — COMPLETE METABOLIC PANEL WITH GFR
AG Ratio: 1.3 (calc) (ref 1.0–2.5)
ALT: 22 U/L (ref 6–29)
AST: 21 U/L (ref 10–35)
Albumin: 4 g/dL (ref 3.6–5.1)
Alkaline phosphatase (APISO): 57 U/L (ref 33–130)
BUN/Creatinine Ratio: 26 (calc) — ABNORMAL HIGH (ref 6–22)
BUN: 42 mg/dL — ABNORMAL HIGH (ref 7–25)
CO2: 32 mmol/L (ref 20–32)
Calcium: 9.3 mg/dL (ref 8.6–10.4)
Chloride: 98 mmol/L (ref 98–110)
Creat: 1.64 mg/dL — ABNORMAL HIGH (ref 0.60–0.88)
GFR, Est African American: 34 mL/min/{1.73_m2} — ABNORMAL LOW (ref >=60)
GFR, Est Non African American: 29 mL/min/{1.73_m2} — ABNORMAL LOW (ref >=60)
Globulin: 3 g/dL (calc) (ref 1.9–3.7)
Glucose, Bld: 179 mg/dL — ABNORMAL HIGH (ref 65–99)
Potassium: 4.2 mmol/L (ref 3.5–5.3)
Sodium: 139 mmol/L (ref 135–146)
Total Bilirubin: 0.6 mg/dL (ref 0.2–1.2)
Total Protein: 7 g/dL (ref 6.1–8.1)

## 2018-08-18 LAB — TEST AUTHORIZATION

## 2018-08-18 LAB — TSH: TSH: 5.66 mIU/L — ABNORMAL HIGH (ref 0.40–4.50)

## 2018-08-18 LAB — HEMOGLOBIN A1C
Hgb A1c MFr Bld: 7.5 % of total Hgb — ABNORMAL HIGH (ref <5.7)
Mean Plasma Glucose: 169 (calc)
eAG (mmol/L): 9.3 (calc)

## 2018-08-20 ENCOUNTER — Ambulatory Visit (HOSPITAL_COMMUNITY)
Admission: RE | Admit: 2018-08-20 | Discharge: 2018-08-20 | Disposition: A | Discharge status: Home / Self Care | Payer: Medicare Other | Source: Ambulatory Visit | Attending: Gastroenterology | Admitting: Gastroenterology

## 2018-08-20 DIAGNOSIS — D649 Anemia, unspecified: Secondary | ICD-10-CM | POA: Insufficient documentation

## 2018-08-20 MED ORDER — SODIUM CHLORIDE 0.9 % IV SOLN
200.0000 mg | Freq: Once | INTRAVENOUS | Status: AC
  Administered 2018-08-20: 200 mg via INTRAVENOUS
  Filled 2018-08-20: qty 10

## 2018-08-23 ENCOUNTER — Ambulatory Visit: Payer: Medicare Other | Admitting: Internal Medicine

## 2018-08-25 ENCOUNTER — Other Ambulatory Visit: Payer: Self-pay | Admitting: *Deleted

## 2018-08-25 DIAGNOSIS — I5032 Chronic diastolic (congestive) heart failure: Secondary | ICD-10-CM

## 2018-08-25 DIAGNOSIS — R195 Other fecal abnormalities: Secondary | ICD-10-CM

## 2018-08-25 DIAGNOSIS — E119 Type 2 diabetes mellitus without complications: Secondary | ICD-10-CM

## 2018-08-25 DIAGNOSIS — I10 Essential (primary) hypertension: Secondary | ICD-10-CM

## 2018-08-25 MED ORDER — FUROSEMIDE 40 MG PO TABS: 80.0000 mg | ORAL_TABLET | Freq: Every day | ORAL | 1 refills | Status: DC

## 2018-08-25 MED ORDER — GLIMEPIRIDE 2 MG PO TABS: ORAL_TABLET | ORAL | 1 refills | Status: DC

## 2018-08-25 MED ORDER — PANTOPRAZOLE SODIUM 40 MG PO TBEC: 40.0000 mg | DELAYED_RELEASE_TABLET | Freq: Two times a day (BID) | ORAL | 1 refills | Status: DC

## 2018-08-25 MED ORDER — DOXAZOSIN MESYLATE 4 MG PO TABS: ORAL_TABLET | ORAL | 1 refills | Status: DC

## 2018-08-25 NOTE — Telephone Encounter (Signed)
Patient requested refill. Requested to be sent to [facility]

## 2018-08-27 ENCOUNTER — Other Ambulatory Visit: Payer: Self-pay | Admitting: Nurse Practitioner

## 2018-08-27 DIAGNOSIS — Z1231 Encounter for screening mammogram for malignant neoplasm of breast: Secondary | ICD-10-CM

## 2018-09-02 ENCOUNTER — Other Ambulatory Visit: Payer: Self-pay | Admitting: Nurse Practitioner

## 2018-09-02 ENCOUNTER — Ambulatory Visit (INDEPENDENT_AMBULATORY_CARE_PROVIDER_SITE_OTHER): Payer: Medicare Other | Admitting: Nurse Practitioner

## 2018-09-02 ENCOUNTER — Encounter: Payer: Self-pay | Admitting: Nurse Practitioner

## 2018-09-02 VITALS — BP 142/74 | HR 65 | Temp 97.8°F | Ht 63.0 in | Wt 225.0 lb

## 2018-09-02 DIAGNOSIS — J449 Chronic obstructive pulmonary disease, unspecified: Secondary | ICD-10-CM

## 2018-09-02 DIAGNOSIS — D500 Iron deficiency anemia secondary to blood loss (chronic): Secondary | ICD-10-CM

## 2018-09-02 DIAGNOSIS — Z6839 Body mass index (BMI) 39.0-39.9, adult: Secondary | ICD-10-CM

## 2018-09-02 DIAGNOSIS — E039 Hypothyroidism, unspecified: Secondary | ICD-10-CM | POA: Diagnosis not present

## 2018-09-02 DIAGNOSIS — N183 Chronic kidney disease, stage 3 unspecified: Secondary | ICD-10-CM

## 2018-09-02 DIAGNOSIS — N3281 Overactive bladder: Secondary | ICD-10-CM

## 2018-09-02 DIAGNOSIS — E119 Type 2 diabetes mellitus without complications: Secondary | ICD-10-CM

## 2018-09-02 DIAGNOSIS — Z23 Encounter for immunization: Secondary | ICD-10-CM

## 2018-09-02 DIAGNOSIS — E785 Hyperlipidemia, unspecified: Secondary | ICD-10-CM | POA: Diagnosis not present

## 2018-09-02 MED ORDER — ROSUVASTATIN CALCIUM 10 MG PO TABS: 10.0000 mg | ORAL_TABLET | Freq: Every day | ORAL | 3 refills | Status: DC

## 2018-09-02 MED ORDER — LOSARTAN POTASSIUM 25 MG PO TABS: 25.0000 mg | ORAL_TABLET | Freq: Every day | ORAL | 1 refills | Status: DC

## 2018-09-02 NOTE — Patient Instructions (Addendum)
Stop pravastatin start Crestor 10 mg by mouth daily for cholesterol   Start losartan 25 mg by mouth daily  Bring blood pressure log to next visit   Follow up in 1 month for doctor visit for blood pressure, tremor and repeat blood work  Fat and Cholesterol Restricted Diet Getting too much fat and cholesterol in your diet may cause health problems. Following this diet helps keep your fat and cholesterol at normal levels. This can keep you from getting sick. What types of fat should I choose?  Choose monosaturated and polyunsaturated fats. These are found in foods such as olive oil, canola oil, flaxseeds, walnuts, almonds, and seeds.  Eat more omega-3 fats. Good choices include salmon, mackerel, sardines, tuna, flaxseed oil, and ground flaxseeds.  Limit saturated fats. These are in animal products such as meats, butter, and cream. They can also be in plant products such as palm oil, palm kernel oil, and coconut oil.  Avoid foods with partially hydrogenated oils in them. These contain trans fats. Examples of foods that have trans fats are stick margarine, some tub margarines, cookies, crackers, and other baked goods. What general guidelines do I need to follow?  Check food labels. Look for the words "trans fat" and "saturated fat."  When preparing a meal: ? Fill half of your plate with vegetables and green salads. ? Fill one fourth of your plate with whole grains. Look for the word "whole" as the first word in the ingredient list. ? Fill one fourth of your plate with lean protein foods.  Eat more foods that have fiber, like apples, carrots, beans, peas, and barley.  Eat more home-cooked foods. Eat less at restaurants and buffets.  Limit or avoid alcohol.  Limit foods high in starch and sugar.  Limit fried foods.  Cook foods without frying them. Baking, boiling, grilling, and broiling are all great options.  Lose weight if you are overweight. Losing even a small amount of weight  can help your overall health. It can also help prevent diseases such as diabetes and heart disease. What foods can I eat? Grains Whole grains, such as whole wheat or whole grain breads, crackers, cereals, and pasta. Unsweetened oatmeal, bulgur, barley, quinoa, or brown rice. Corn or whole wheat flour tortillas. Vegetables Fresh or frozen vegetables (raw, steamed, roasted, or grilled). Green salads. Fruits All fresh, canned (in natural juice), or frozen fruits. Meat and Other Protein Products Ground beef (85% or leaner), grass-fed beef, or beef trimmed of fat. Skinless chicken or [place]. Ground chicken or [place]. Pork trimmed of fat. All fish and seafood. Eggs. Dried beans, peas, or lentils. Unsalted nuts or seeds. Unsalted canned or dry beans. Dairy Low-fat dairy products, such as skim or 1% milk, 2% or reduced-fat cheeses, low-fat ricotta or cottage cheese, or plain low-fat yogurt. Fats and Oils Tub margarines without trans fats. Light or reduced-fat mayonnaise and salad dressings. Avocado. Olive, canola, sesame, or safflower oils. Natural peanut or almond butter (choose ones without added sugar and oil). The items listed above may not be a complete list of recommended foods or beverages. Contact your dietitian for more options. What foods are not recommended? Grains White bread. White pasta. White rice. Cornbread. Bagels, pastries, and croissants. Crackers that contain trans fat. Vegetables White potatoes. Corn. Creamed or fried vegetables. Vegetables in a cheese sauce. Fruits Dried fruits. Canned fruit in light or heavy syrup. Fruit juice. Meat and Other Protein Products Fatty cuts of meat. Ribs, chicken wings, bacon, sausage, bologna, salami, chitterlings,  fatback, hot dogs, bratwurst, and packaged luncheon meats. Liver and organ meats. Dairy Whole or 2% milk, cream, half-and-half, and cream cheese. Whole milk cheeses. Whole-fat or sweetened yogurt. Full-fat cheeses. Nondairy creamers  and whipped toppings. Processed cheese, cheese spreads, or cheese curds. Sweets and Desserts Corn syrup, sugars, honey, and molasses. Candy. Jam and jelly. Syrup. Sweetened cereals. Cookies, pies, cakes, donuts, muffins, and ice cream. Fats and Oils Butter, stick margarine, lard, shortening, ghee, or bacon fat. Coconut, palm kernel, or palm oils. Beverages Alcohol. Sweetened drinks (such as sodas, lemonade, and fruit drinks or punches). The items listed above may not be a complete list of foods and beverages to avoid. Contact your dietitian for more information. This information is not intended to replace advice given to you by your health care provider. Make sure you discuss any questions you have with your health care provider. Document Released: 04/13/2012 Document Revised: 06/19/2016 Document Reviewed: 01/12/2014 Elsevier Interactive Patient Education  [employer].

## 2018-09-02 NOTE — Progress Notes (Signed)
Careteam: Patient Care Team: [name], NP as PCP - General (Geriatric Medicine) Martinique, [name] M, MD as PCP - Cardiology (Cardiology) [name], MD as Consulting Physician (Orthopedic Surgery) Druscilla Brownie, MD as Consulting Physician (Dermatology) Ralene Bathe, MD as Consulting Physician (Ophthalmology)  Advanced Directive information Does Patient Have a Medical Advance Directive?: Yes, Type of Advance Directive: Healthcare Power of Attorney  Allergies  Allergen Reactions  . Codeine Other (See Comments)    "Spaces out" the patient and she cannot walk well  . Vesicare [Solifenacin] Itching    Chief Complaint  Patient presents with  . Medical Management of Chronic Issues    Pt is being seen for a 3 month routine visit.      HPI: Patient is a 82 y.o. female seen in the office today for follow up.  She does havethesignificant past medical history for hypothyroidism, hypertension, type 2 diabetes mellitus, systolic heart failure, status post TAVR, atrial fibrillation on anticoagulation  CHF- lasix was increased to 80 mg daily in April. No increase in swelling or shortness of breath.   Afib- continues on amiodarone, saw cardiologist in April. Off all anticoagulation- continues off eliquis per Dr Martinique.   DM- A1c 7.5, without hypoglycemia, on glimepiride, recently has been on prednisone for COPD. Blood sugars now 120-130s. Last ophthalmologist visit was in august   Hypothyroid- TSH slightly elevated, taking synthroid 88 mcg daily   Hyperlipidemia- LDL up (106) from previous (76). On pravachol 20 mg daily without missed doses, no changes in diet nted.    COPD- following with pulmonary- stable. continues on breo  IBS-D- occasional flare, following with GI. Missed last appt in office due to diarrhea.   Anemia- due to acute blood loss- GI bleed, still getting iron infusions with iron PO supplements.   Pt was previously on lisinopril however  stopped due to cough- however per notes she continued to cough. Currently with cough that she relates to allergies/tickle in her throat.   Review of Systems:  Review of Systems  Constitutional: Positive for malaise/fatigue (improved). Negative for chills, fever and weight loss.  HENT: Negative for congestion and tinnitus.   Respiratory: Positive for shortness of breath (hx of asthma, chronic ongoing follow up with pulmonary). Negative for cough and sputum production.        Overall shortness of breath has been stable and unchanged  Cardiovascular: Negative for chest pain, palpitations and leg swelling.  Gastrointestinal: Positive for diarrhea (followed by GI). Negative for abdominal pain, constipation and heartburn.  Genitourinary: Negative for dysuria, frequency and urgency.  Musculoskeletal: Negative for back pain, falls, joint pain and myalgias.  Skin: Negative.   Neurological: Negative for dizziness, weakness and headaches.  Endo/Heme/Allergies: Positive for environmental allergies.  Psychiatric/Behavioral: Negative for depression and memory loss. The patient does not have insomnia.     Past Medical History:  Diagnosis Date  . Allergy   . Anemia, unspecified   . Arthritis   . Asthma   . Atrial fibrillation status post cardioversion [facility]) 09/2015   s/p TEE/DCCV>>SR on amio  . Benign neoplasm of colon   . Carpal tunnel syndrome   . Cellulitis and abscess of finger, unspecified   . Cerumen impaction   . Cervicalgia   . CHF (congestive heart failure) ([place])   . Chronic airway obstruction, not elsewhere classified   . Chronic anticoagulation 09/2015   Eliquis  . Diarrhea   . Diffuse cystic mastopathy   . Edema   . Encounter for  long-term (current) use of other medications   . GERD (gastroesophageal reflux disease)   . Heart murmur   . Lumbago   . Neck pain 05/23/2015  . Obesity, unspecified   . Other and unspecified hyperlipidemia   . Other psoriasis   . Pain in joint,  lower leg   . PONV (postoperative nausea and vomiting)   . Primary localized osteoarthrosis of right shoulder 12/18/2015  . 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 Scoliosis (and kyphoscoliosis), idiopathic   . Shortness of breath dyspnea    with exertion  . Type II or unspecified type diabetes mellitus without mention of complication, uncontrolled   . Unspecified essential hypertension   . Unspecified hemorrhoids without mention of complication   . Unspecified hypothyroidism   . Urge incontinence    Past Surgical History:  Procedure Laterality Date  . ABDOMINAL HYSTERECTOMY  1987   complete  . BREAST BIOPSY Right   . BREAST EXCISIONAL BIOPSY Left 2011  . BREAST EXCISIONAL BIOPSY Right    x2  . BREAST SURGERY     right breast 3 surgeries [phone]  . CARDIAC CATHETERIZATION N/A 09/02/2016   Procedure: Right/Left Heart Cath and Coronary Angiography;  Surgeon: [name] M Martinique, MD;  Location: [place] [facility];  Service: Cardiovascular;  Laterality: N/A;  . CARDIOVERSION N/A 10/19/2015   Procedure: CARDIOVERSION;  Surgeon: Lelon Perla, MD;  Location: [facility] ENDOSCOPY;  Service: Cardiovascular;  Laterality: N/A;  . CARDIOVERSION N/A 11/13/2017   Procedure: CARDIOVERSION;  Surgeon: Jerline Pain, MD;  Location: [place];  Service: Cardiovascular;  Laterality: N/A;  . [place]  . COLON SURGERY     2004,2007,2013.  3 times colon surgieres  . ESOPHAGOGASTRODUODENOSCOPY (EGD) WITH PROPOFOL Left 03/13/2017   Procedure: ESOPHAGOGASTRODUODENOSCOPY (EGD) WITH PROPOFOL;  Surgeon: Ronnette Juniper, MD;  Location: [place];  Service: Gastroenterology;  Laterality: Left;  . ESOPHAGOGASTRODUODENOSCOPY (EGD) WITH PROPOFOL Left 01/22/2018   Procedure: ESOPHAGOGASTRODUODENOSCOPY (EGD) WITH PROPOFOL;  Surgeon: Arta Silence, MD;  Location: [facility]  ENDOSCOPY;  Service: Endoscopy;  Laterality: Left;  . EXCISE LE MANDIBULAR LYMPH NODE T     DR C. NEWMAN  . FOOT SURGERY  1989  . GIVENS CAPSULE STUDY Left 01/23/2018   Procedure: GIVENS CAPSULE STUDY;  Surgeon: Wilford Corner, MD;  Location: [facility] ENDOSCOPY;  Service: Endoscopy;  Laterality: Left;  . LEFT SHOULDER ARTHROSCOPY    . 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 NEUROPLASTY / TRANSPOSITION MEDIAN NERVE AT CARPAL TUNNEL BILATERAL  05/2003  . TEE WITHOUT CARDIOVERSION N/A 10/19/2015   Procedure: Transesophageal Echocardiogram (TEE) ;  Surgeon: Lelon Perla, MD;  Location: [facility] ENDOSCOPY;  Service: Cardiovascular;  Laterality: N/A;  . TEE WITHOUT CARDIOVERSION N/A 10/14/2016   Procedure: TRANSESOPHAGEAL ECHOCARDIOGRAM (TEE);  Surgeon: Sherren Mocha, MD;  Location: [place];  Service: Open Heart Surgery;  Laterality: N/A;  . TOTAL SHOULDER ARTHROPLASTY Right 12/18/2015   Procedure: TOTAL SHOULDER ARTHROPLASTY;  Surgeon: [name], MD;  Location: [place];  Service: Orthopedics;  Laterality: Right;  . TRANSCATHETER AORTIC VALVE REPLACEMENT, TRANSFEMORAL N/A 10/14/2016   Procedure: TRANSCATHETER AORTIC VALVE REPLACEMENT, TRANSFEMORAL;  Surgeon: Sherren Mocha, MD;  Location: [place];  Service: Open Heart Surgery;  Laterality: N/A;  . TRIGGER FINGER RELEASE Left   . VAGINAL CYST REMOVED  1967   Social History:   reports that she quit smoking about 29 years  ago. She has a 40.00 pack-year smoking history. She has never used smokeless tobacco. She reports that she does not drink alcohol or use drugs.  Family History  Problem Relation Age of Onset  . Diabetes Father   . Stroke Father   . Heart disease Father   . Liver cancer Sister   . Heart disease Brother   . Asthma Sister   . Arthritis Sister     Medications: Patient's Medications  New Prescriptions   No medications on file  Previous Medications   ACETAMINOPHEN (TYLENOL) 500 MG TABLET    Take 500 mg by mouth every 6 (six) hours as  needed for mild pain.   ALBUTEROL (PROVENTIL) (2.5 MG/3ML) 0.083% NEBULIZER SOLUTION    USE 1 VIAL IN NEBULIZER EVERY 4 HOURS AND AS NEEDED. Generic: VENTOLIN   ALBUTEROL (VENTOLIN HFA) 108 (90 BASE) MCG/ACT INHALER    Inhale 2 puffs into the lungs every 6 (six) hours as needed for wheezing or shortness of breath.   AMIODARONE (PACERONE) 200 MG TABLET    Take 200 mg by mouth daily.   CETIRIZINE (ZYRTEC) 10 MG TABLET    Take 10 mg by mouth daily as needed for allergies.    CHOLECALCIFEROL (VITAMIN D) 1000 UNITS TABLET    Take 1,000 Units by mouth at bedtime.    COLESTIPOL (COLESTID) 1 G TABLET    Take 1 tablet by mouth 2 (two) times daily.   DOXAZOSIN (CARDURA) 4 MG TABLET    Take one tablet by mouth once daily to help control blood pressure   FERROUS SULFATE 325 (65 FE) MG EC TABLET    Take 325 mg by mouth 2 (two) times daily.   FLUTICASONE FUROATE-VILANTEROL (BREO ELLIPTA) 200-25 MCG/INH AEPB    Inhale 1 puff into the lungs daily.   FUROSEMIDE (LASIX) 40 MG TABLET    Take 2 tablets (80 mg total) by mouth daily.   GLIMEPIRIDE (AMARYL) 2 MG TABLET    Take one tablet by mouth once daily at breakfast to control glucose   LEVOTHYROXINE (SYNTHROID, LEVOTHROID) 88 MCG TABLET    Take 1 tablet (88 mcg total) by mouth daily.   MYRBETRIQ 25 MG TB24 TABLET    TAKE 1 TABLET BY MOUTH DAILY TO HELP BLADDER CONTROL   PANTOPRAZOLE (PROTONIX) 40 MG TABLET    Take 1 tablet (40 mg total) by mouth 2 (two) times daily.   POTASSIUM GLUCONATE 595 (99 K) MG TABS TABLET    Take 595 mg by mouth See admin instructions. TAKES ONLY WHEN USING FUROSEMIDE   PRAVASTATIN (PRAVACHOL) 20 MG TABLET    TAKE 1 TABLET(20 MG) BY MOUTH DAILY   TRIAMCINOLONE (KENALOG) 0.025 % CREAM    APPLY  CREAM TOPICALLY ONCE DAILY AS NEEDED FOR PSORIASIS  Modified Medications   No medications on file  Discontinued Medications   BACLOFEN (LIORESAL) 10 MG TABLET    Take 10 mg by mouth 2 (two) times daily.   FLUTICASONE FUROATE-VILANTEROL (BREO  ELLIPTA) 200-25 MCG/INH AEPB    Inhale 1 puff into the lungs daily.   PREDNISONE (DELTASONE) 10 MG TABLET    Take 2 tabs x 5 days     Physical Exam:  Vitals:   09/02/18 1254  BP: (!) 142/74  Pulse: 65  Temp: 97.8 F (36.6 C)  TempSrc: Oral  SpO2: 97%  Weight: 225 lb (102.1 kg)  Height: _0  (1.6 m)   Body mass index is 39.86 kg/m.  Physical Exam  Constitutional: She is  oriented to person, place, and time. She appears well-developed and well-nourished. No distress.  HENT:  Head: Normocephalic and atraumatic.  Nose: Nose normal.  Mouth/Throat: Oropharynx is clear and moist.  Eyes: Conjunctivae and EOM are normal.  Neck: Normal range of motion. Neck supple. No tracheal deviation present. No thyromegaly present.  Cardiovascular: Normal rate and regular rhythm. Exam reveals no gallop and no friction rub.  Murmur (2/6 SEM) heard. Pulmonary/Chest: Effort normal and breath sounds normal. No respiratory distress. She has no wheezes. She exhibits no tenderness.  Abdominal: Soft. Bowel sounds are normal. She exhibits no distension and no mass. There is no tenderness.  Musculoskeletal: Normal range of motion. She exhibits edema. She exhibits no tenderness (trace edema noted to left foot).  Neurological: She is alert and oriented to person, place, and time. She has normal reflexes. She displays normal reflexes. No cranial nerve deficit. Coordination normal.  Skin: No rash noted. No erythema. No pallor.  Psychiatric: She has a normal mood and affect. Judgment and thought content normal.    Labs reviewed: Basic Metabolic Panel: Recent Labs    11/13/17 0904  01/21/18 0543  04/20/18 0915 08/06/18 1006 08/12/18 1007 08/16/18 0919  NA 140   < > 139   < > 138 140 139 139  K 4.5   < > 3.9   < > 4.7 4.4 4.0 4.2  CL 103   < > 104   < > 99 100 96 98  CO2 24   < > 23   < > _0 32  GLUCOSE 168*   < > 170*   < > 182* 173* 210* 179*  BUN 46*   < > 101*   < > 37* 35* 51* 42*    CREATININE 2.28*   < > 2.31*   < > 1.74* 1.68* 1.69* 1.64*  CALCIUM 9.1   < > 7.6*   < > 9.7 9.0 9.5 9.3  MG 2.1  --   --   --   --   --   --   --   TSH  --    < > 5.553*  --  6.79*  --   --  5.66*   < > = values in this interval not displayed.   Liver Function Tests: Recent Labs    11/12/17 1828  01/20/18 1503 04/20/18 0915 08/16/18 0919  AST 22   < > _1 ALT 21   < > _2 ALKPHOS 58  --  40  --   --   BILITOT 0.8   < > 0.5 0.4 0.6  PROT 7.1   < > 5.8* 6.9 7.0  ALBUMIN 3.7  --  3.0*  --   --    < > = values in this interval not displayed.   Recent Labs    01/20/18 1503  LIPASE 29   No results for input(s): AMMONIA in the last 8760 hours. CBC: Recent Labs    02/22/18 1057 04/20/18 0915 08/06/18 1006 08/16/18 0919  WBC 6.5 5.9 5.5 5.5  NEUTROABS 4.8 4,047  --  3,784  HGB 10.1* 10.9* 10.6* 11.3*  HCT 30.5* 34.1* 32.1* 34.6*  MCV 89.1 86.5 89.1 87.4  PLT 225.0 217 197.0 196   Lipid Panel: Recent Labs    04/20/18 0915 08/16/18 0919  CHOL 198 182  HDL 63 58  LDLCALC 106* 98  TRIG 174* 157*  CHOLHDL 3.1 3.1   TSH: Recent Labs  01/21/18 0543 04/20/18 0915 08/16/18 0919  TSH 5.553* 6.79* 5.66*   A1C: Lab Results  Component Value Date   HGBA1C 7.5 (H) 08/16/2018     Assessment/Plan 1. Need for influenza vaccination - Flu vaccine HIGH DOSE PF (Fluzone High dose)  2. Body mass index (BMI) of 39.0-39.9 in adult Noted today  3. Class 2 severe obesity due to excess calories with serious comorbidity and body mass index (BMI) of 39.0 to 39.9 in adult [facility]) Discussed changes in diet, unable to increase activity due to CHF, COPD and pain in hip  4. Chronic obstructive pulmonary disease, unspecified COPD type ([place]) Stable at this time. Currently controlled on current regimen.   5. Type 2 diabetes mellitus without complication, without long-term current use of insulin (HCC) Elevated a1c, discussed dietary modifications at this time.  Taking  glimepiride 2 mg daily without hypoglycemia.  - losartan (COZAAR) 25 MG tablet; Take 1 tablet (25 mg total) by mouth daily.  Dispense: 30 tablet; Refill: 1  6. Hypothyroidism, unspecified type -TSH stable, will continue current dose  7. CKD (chronic kidney disease) stage 3, GFR 30-59 ml/min (HCC) -noted, will add losartan for renal protection, will have her follow up in 1 month to check blood pressure and repeat labs - losartan (COZAAR) 25 MG tablet; Take 1 tablet (25 mg total) by mouth daily.  Dispense: 30 tablet; Refill: 1  8. Blood loss anemia Ongoing anemia, needing iron transfusions and taking by mouth iron supplements  9. Hyperlipidemia, unspecified hyperlipidemia type Not at goal. To stop pravastatin and start crestor for better control. Diet modifications encouraged - rosuvastatin (CRESTOR) 10 MG tablet; Take 1 tablet (10 mg total) by mouth daily.  Dispense: 90 tablet; Refill: 3  10. Overactive bladder Feels like myrbetriq is not effective, will stop at this time.   11. Tremor Noted tremor at times, will come and go. Does effect her sometimes when she is eating but not all the time. No tremor during OV until mentioned then improved towards end of visit. Will monitor. Pt does not wish to start medications at this time.   Next appt: 1 month for blood pressure, tremor and labs with Dr Sharee Holster K. [place], [facility] [phone]

## 2018-09-10 NOTE — Progress Notes (Signed)
Cardiology Office Note   Date:  09/13/2018   ID:  Patricia [name], DOB June 17, 1936, MRN [id number]  PCP:  [name], NP  Cardiologist:   [name] Martinique, MD   Chief Complaint  Patient presents with  . Shortness of Breath    randomly  . Edema    left foot and ankle.       History of Present Illness: Patricia [name] is a 82 y.o. female who is seen for follow up Afib.  She has a history of HTN, obesity, DM, HL, and anemia. She was admitted to the hospital in December 2016 with increased dyspnea. She was found to be in atrial fibrillation with RVR. She had pulmonary edema. Hgb was 10. Echo showed EF 55-60% with moderate to severe AS. She was anticoagulated with Xarelto and started on amiodarone. She had TEE guided DCCV with return to NSR. She was seen back in the office on 11/20/15 and was still in NSR.  On 12/18/15 she underwent right shoulder arthroplasty. Post op Hgb was 9.6. It subsequently dropped to 7.1. She was transfused. She has been on iron. Hgb improved to 10. She did have EGD and colonoscopy with both gastric and colonic polyps. No bleeding seen.   On follow up in October 2017 she noted symptoms of increased dyspnea.  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 Mild pulmonary HTN and elevated filling pressures. She was admitted in November with acute CHF and was diuresed. She was evaluated in valve clinic and underwent transfemoral TAVR on 10/14/16. Post op course was uncomplicated. Repeat Echo in January  Showed higher than expected AV prosthesis gradient felt to be related to vigorous LV systolic function and Valve/ patient mismatch. She has been on  Eliquis. She has noted moderate improvement in her breathing.   She continues to have issues with COPD and bronchitis- followed by primary care and pulmonary.  In March 2018 she was noted to have progressive anemia with dark stools. Hgb down to 7.4. She was transfused and  EGD showed multiple gastric polyps and some oozing AVMs that were cauterized. Once again she was admitted in May with worsening anemia. 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 Transfused 1 units PRBCs. Plan for outpatient capsule endoscopy.   She was seen January 9 with complaints of cough that did not improve with stopping ACEi. Some pulmonary crackles noted at the time. Pulse rate 70 then. CT chest in December showed emphysema and periapical scarring but no acute infiltrates. Seen later in January with increased SOB and rapid HR. She was found to be in Afib. She was admitted and amiodarone dose increased she underwent successful DCCV. Continued on Eliquis.   She was admitted March 27-April 2 with abdominal pain and severe weakness. Hgb down to 4.2. Creatinine up to 2.34. Patient was admitted to the medical ward, she received 4 units of packed red blood cells transfusion and anticoagulation was held. Patient was given proton pump inhibitors for antiacid therapy. Her hemoglobin and hematocrit stabilized with a discharge hemoglobin of 8.4 and hematocrit 27.1. No further signs of bleeding. Further workup with upper endoscopy showed normal esophagus, erosive gastropathy, normal duodenum. CT of the abdomen with no source of bleeding. She underwent capsule endoscopy. This did not show any bleeding source.  She did require IV lasix for volume overload. Creatinine improved to 1.45. Hgb 11.3 on last evaluation. Given bleeding risk anticoagulation was discontinued.  On follow up today she reports increased SOB about 3 weeks ago. Given a course of prednisone and increased lasix for 3 days with improvement. She does note that if she overexerts her HR goes up to 100. She is getting iron infusions every other month. No bleeding noted. She is seeing ortho for low back pain.     Past Medical History:  Diagnosis Date  . Allergy   . Anemia, unspecified   . Arthritis   . Asthma   . Atrial  fibrillation status post cardioversion [facility]) 09/2015   s/p TEE/DCCV>>SR on amio  . Benign neoplasm of colon   . Carpal tunnel syndrome   . Cellulitis and abscess of finger, unspecified   . Cerumen impaction   . Cervicalgia   . CHF (congestive heart failure) ([place])   . Chronic airway obstruction, not elsewhere classified   . Chronic anticoagulation 09/2015   Eliquis  . Diarrhea   . Diffuse cystic mastopathy   . Edema   . Encounter for long-term (current) use of other medications   . GERD (gastroesophageal reflux disease)   . Heart murmur   . Lumbago   . Neck pain 05/23/2015  . Obesity, unspecified   . Other and unspecified hyperlipidemia   . Other psoriasis   . Pain in joint, lower leg   . PONV (postoperative nausea and vomiting)   . Primary localized osteoarthrosis of right shoulder 12/18/2015  . 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 Scoliosis (and kyphoscoliosis), idiopathic   . Shortness of breath dyspnea    with exertion  . Type II or unspecified type diabetes mellitus without mention of complication, uncontrolled   . Unspecified essential hypertension   . Unspecified hemorrhoids without mention of complication   . Unspecified hypothyroidism   . Urge incontinence     Past Surgical History:  Procedure Laterality Date  . ABDOMINAL HYSTERECTOMY  1987   complete  . BREAST BIOPSY Right   . BREAST EXCISIONAL BIOPSY Left 2011  . BREAST EXCISIONAL BIOPSY Right    x2  . BREAST SURGERY     right breast 3 surgeries [phone]  . CARDIAC CATHETERIZATION N/A 09/02/2016   Procedure: Right/Left Heart Cath and Coronary Angiography;  Surgeon: [name] M Martinique, MD;  Location: [place] CV LAB;  Service: Cardiovascular;  Laterality: N/A;  . CARDIOVERSION N/A 10/19/2015   Procedure: CARDIOVERSION;  Surgeon: Lelon Perla, MD;  Location: [facility] ENDOSCOPY;   Service: Cardiovascular;  Laterality: N/A;  . CARDIOVERSION N/A 11/13/2017   Procedure: CARDIOVERSION;  Surgeon: Jerline Pain, MD;  Location: [place];  Service: Cardiovascular;  Laterality: N/A;  . [place]  . COLON SURGERY     2004,2007,2013.  3 times colon surgieres  . ESOPHAGOGASTRODUODENOSCOPY (EGD) WITH PROPOFOL Left 03/13/2017   Procedure: ESOPHAGOGASTRODUODENOSCOPY (EGD) WITH PROPOFOL;  Surgeon: Ronnette Juniper, MD;  Location: [place];  Service: Gastroenterology;  Laterality: Left;  . ESOPHAGOGASTRODUODENOSCOPY (EGD) WITH PROPOFOL Left 01/22/2018   Procedure: ESOPHAGOGASTRODUODENOSCOPY (EGD) WITH PROPOFOL;  Surgeon: Arta Silence, MD;  Location[facility] ENDOSCOPY;  Service: Endoscopy;  Laterality: Left;  . EXCISE LE MANDIBULAR LYMPH NODE T     DR C. NEWMAN  . FOOT SURGERY  1989  . GIVENS CAPSULE STUDY Left 01/23/2018   Procedure: GIVENS CAPSULE STUDY;  Surgeon: Wilford Corner, MD;  Location: [facility] ENDOSCOPY;  Service: Endoscopy;  Laterality: Left;  .  LEFT SHOULDER ARTHROSCOPY    . 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 NEUROPLASTY / TRANSPOSITION MEDIAN NERVE AT CARPAL TUNNEL BILATERAL  05/2003  . TEE WITHOUT CARDIOVERSION N/A 10/19/2015   Procedure: Transesophageal Echocardiogram (TEE) ;  Surgeon: Lelon Perla, MD;  Location: [facility] ENDOSCOPY;  Service: Cardiovascular;  Laterality: N/A;  . TEE WITHOUT CARDIOVERSION N/A 10/14/2016   Procedure: TRANSESOPHAGEAL ECHOCARDIOGRAM (TEE);  Surgeon: Sherren Mocha, MD;  Location: [place];  Service: Open Heart Surgery;  Laterality: N/A;  . TOTAL SHOULDER ARTHROPLASTY Right 12/18/2015   Procedure: TOTAL SHOULDER ARTHROPLASTY;  Surgeon: [name], MD;  Location: [place];  Service: Orthopedics;  Laterality: Right;  . TRANSCATHETER AORTIC VALVE REPLACEMENT, TRANSFEMORAL N/A 10/14/2016   Procedure: TRANSCATHETER AORTIC VALVE REPLACEMENT, TRANSFEMORAL;  Surgeon: Sherren Mocha, MD;  Location: [place];  Service: Open Heart Surgery;   Laterality: N/A;  . TRIGGER FINGER RELEASE Left   . VAGINAL CYST REMOVED  1967     Current Outpatient Medications  Medication Sig Dispense Refill  . acetaminophen (TYLENOL) 500 MG tablet Take 500 mg by mouth every 6 (six) hours as needed for mild pain.    Marland Kitchen albuterol (PROVENTIL) (2.5 MG/3ML) 0.083% nebulizer solution USE 1 VIAL IN NEBULIZER EVERY 4 HOURS AND AS NEEDED. Generic: VENTOLIN 60 vial 10  . albuterol (VENTOLIN HFA) 108 (90 Base) MCG/ACT inhaler Inhale 2 puffs into the lungs every 6 (six) hours as needed for wheezing or shortness of breath. 1 Inhaler 11  . amiodarone (PACERONE) 200 MG tablet Take 200 mg by mouth daily.    . cetirizine (ZYRTEC) 10 MG tablet Take 10 mg by mouth daily as needed for allergies.     . cholecalciferol (VITAMIN D) 1000 units tablet Take 1,000 Units by mouth at bedtime.     . colestipol (COLESTID) 1 g tablet Take 1 tablet by mouth 2 (two) times daily.  3  . doxazosin (CARDURA) 4 MG tablet Take one tablet by mouth once daily to help control blood pressure 90 tablet 1  . ferrous sulfate 325 (65 FE) MG EC tablet Take 325 mg by mouth 2 (two) times daily.    . fluticasone furoate-vilanterol (BREO ELLIPTA) 200-25 MCG/INH AEPB Inhale 1 puff into the lungs daily. 28 each 0  . furosemide (LASIX) 40 MG tablet Take 2 tablets (80 mg total) by mouth daily. 180 tablet 1  . glimepiride (AMARYL) 2 MG tablet Take one tablet by mouth once daily at breakfast to control glucose 90 tablet 1  . levothyroxine (SYNTHROID, LEVOTHROID) 88 MCG tablet Take 1 tablet (88 mcg total) by mouth daily. 90 tablet 3  . losartan (COZAAR) 25 MG tablet TAKE 1 TABLET(25 MG) BY MOUTH DAILY 90 tablet 1  . pantoprazole (PROTONIX) 40 MG tablet Take 1 tablet (40 mg total) by mouth 2 (two) times daily. 180 tablet 1  . potassium gluconate 595 (99 K) MG TABS tablet Take 595 mg by mouth See admin instructions. TAKES ONLY WHEN USING FUROSEMIDE    . rosuvastatin (CRESTOR) 10 MG tablet Take 1 tablet (10 mg  total) by mouth daily. 90 tablet 3  . triamcinolone (KENALOG) 0.025 % cream APPLY  CREAM TOPICALLY ONCE DAILY AS NEEDED FOR PSORIASIS 30 g 1   No current facility-administered medications for this visit.     Allergies:   Codeine and Vesicare [solifenacin]    Social History:  The patient  reports that she quit smoking about 29 years ago. She has a 40.00 pack-year smoking history. She has never used smokeless  tobacco. She reports that she does not drink alcohol or use drugs.   Family History:  The patient's family history includes Arthritis in her sister; Asthma in her sister; Diabetes in her father; Heart disease in her brother and father; Liver cancer in her sister; Stroke in her father.    ROS:  Please see the history of present illness.   Otherwise, review of systems are positive for none.   All other systems are reviewed and negative.    PHYSICAL EXAM: VS:  BP (!) 154/64   Pulse 68   Ht _0  (1.6 m)   Wt 226 lb 9.6 oz (102.8 kg)   BMI 40.14 kg/m  , BMI Body mass index is 40.14 kg/m. GENERAL:  Chronically ill appearing obese WF in NAD. Walks with a cane. HEENT:  PERRL, EOMI, sclera are clear. Oropharynx is clear. NECK:  No jugular venous distention, carotid upstroke brisk and symmetric, no bruits, no thyromegaly or adenopathy LUNGS:  Clear to auscultation bilaterally CHEST:  Unremarkable HEART:  RRR,  PMI not displaced or sustained,S1 and S2 within normal limits, no S3, no S4: no clicks, no rubs, gr 2/6 systolic murmur RUSB.  ABD:  Soft, nontender. BS +, no masses or bruits. No hepatomegaly, no splenomegaly EXT:  2 + pulses throughout, no edema, no cyanosis no clubbing SKIN:  Warm and dry.  No rashes NEURO:  Alert and oriented x 3. Cranial nerves II through XII intact. PSYCH:  Cognitively intact   EKG:  EKG is not ordered today.    Recent Labs: Lab Results  Component Value Date   WBC 5.5 08/16/2018   HGB 11.3 (L) 08/16/2018   HCT 34.6 (L) 08/16/2018   PLT 196  08/16/2018   GLUCOSE 179 (H) 08/16/2018   CHOL 182 08/16/2018   TRIG 157 (H) 08/16/2018   HDL 58 08/16/2018   LDLCALC 98 08/16/2018   ALT 22 08/16/2018   AST 21 08/16/2018   NA 139 08/16/2018   K 4.2 08/16/2018   CL 98 08/16/2018   CREATININE 1.64 (H) 08/16/2018   BUN 42 (H) 08/16/2018   CO2 32 08/16/2018   TSH 5.66 (H) 08/16/2018   INR 1.44 01/20/2018   HGBA1C 7.5 (H) 08/16/2018   MICROALBUR 6.3 06/11/2017      Lipid Panel    Component Value Date/Time   CHOL 182 08/16/2018 0919   CHOL 182 04/28/2016 0940   TRIG 157 (H) 08/16/2018 0919   HDL 58 08/16/2018 0919   HDL 61 04/28/2016 0940   CHOLHDL 3.1 08/16/2018 0919   VLDL 19 06/11/2017 0832   LDLCALC 98 08/16/2018 0919      Wt Readings from Last 3 Encounters:  09/13/18 226 lb 9.6 oz (102.8 kg)  09/02/18 225 lb (102.1 kg)  08/20/18 225 lb (102.1 kg)      Other studies Reviewed: Additional studies/ records that were reviewed today include:   Echo 10/14/17: Study Conclusions  - Left ventricle: The cavity size was normal. Wall thickness was   increased in a pattern of moderate LVH. Systolic function was   normal. The estimated ejection fraction was in the range of 60%   to 65%. Wall motion was normal; there were no regional wall   motion abnormalities. Diastolic dysfunction with elevated LV   filling pressure. - Aortic valve: s/p TAVR. No obstruction or paravalvular leak. Mean   gradient (S): 24 mm Hg. Peak gradient (S): 47 mm Hg. Valve area   (VTI): 1.19 cm^2. Valve area (Vmax): 1.09  cm^2. Valve area   (Vmean): 1.07 cm^2. - Mitral valve: Calcified annulus. Mildly thickened leaflets . Mild   stenosis. There was moderate regurgitation. - Left atrium: Severely dilated. - Right atrium: Mildly dilated. - Tricuspid valve: There was mild regurgitation. - Pulmonary arteries: PA peak pressure: 51 mm Hg (S). - Inferior vena cava: The vessel was normal in size. The   respirophasic diameter changes were in the normal  range (>= 50%),   consistent with normal central venous pressure.  Impressions:  - Compared to a prior study in 10/2016, the TAVR valve appears   stable.  Extensive hospital records reviewed as noted above.   ASSESSMENT AND PLAN:  1.  Atrial fibrillation. S/p DCCV in December 2016 and again January 2019. [place] Vasc score of 6. Now on higher dose of amiodarone 200 mg daily. Anticoagulation discontinued due to recurrent GI bleeds. Based on the fact she has 3 GI bleeding episodes last year I do not think she is a candidate for long term anticoagulation.  Will try and maintain NSR with amiodarone. Continue 200 mg daily  2.  Severe Aortic stenosis. Echo in July 2017 showed progression with velocity > 4 cm/sec and mean gradient of 40 mm Hg. Now s/p TAVR in December 2017. Follow up Echo 12/18 with higher than expected gradient (mean gradient of 29 mm Hg) due to patient/ prosthetic mismatch with small annuls size and vigorous LV function. Exam is stable.  3. Anemia Fe deficient with GI blood loss secondary to AVMS and erosive gastropathy. GI work up as noted. Risk of recurrent bleeding is high. no anticoagulation or antiplatelet therapy. Recent Hgb 11.3  4. Chronic kidney disease stage 3. stable  5. S/p right shoulder arthroplasty.   6. Acute on Chronic diastolic CHF. Weight is stable and she has no edema today.  Would continue higher dose of lasix 80 mg daily.  7. HTN  8. DM type 2. A1c 7.5%. Managed by primary care.  9. COPD. On inhaler therapy per pulmonary.   I will follow up in 6 months.   Signed, [name] Martinique, MD  09/13/2018 9:38 AM    [facility] [address], [place], [place], [address] Phone [phone], Fax [phone]

## 2018-09-13 ENCOUNTER — Encounter: Payer: Self-pay | Admitting: Cardiology

## 2018-09-13 ENCOUNTER — Ambulatory Visit (INDEPENDENT_AMBULATORY_CARE_PROVIDER_SITE_OTHER): Payer: Medicare Other | Admitting: Cardiology

## 2018-09-13 VITALS — BP 154/64 | HR 68 | Ht 63.0 in | Wt 226.6 lb

## 2018-09-13 DIAGNOSIS — J449 Chronic obstructive pulmonary disease, unspecified: Secondary | ICD-10-CM | POA: Diagnosis not present

## 2018-09-13 DIAGNOSIS — I5032 Chronic diastolic (congestive) heart failure: Secondary | ICD-10-CM | POA: Diagnosis not present

## 2018-09-13 DIAGNOSIS — Z952 Presence of prosthetic heart valve: Secondary | ICD-10-CM

## 2018-09-13 DIAGNOSIS — E662 Morbid (severe) obesity with alveolar hypoventilation: Secondary | ICD-10-CM | POA: Diagnosis not present

## 2018-09-13 DIAGNOSIS — I10 Essential (primary) hypertension: Secondary | ICD-10-CM

## 2018-09-13 DIAGNOSIS — I480 Paroxysmal atrial fibrillation: Secondary | ICD-10-CM

## 2018-09-13 NOTE — Patient Instructions (Signed)
Continue your current therapy  I will see you in 6 months.

## 2018-09-15 DIAGNOSIS — M545 Low back pain: Secondary | ICD-10-CM | POA: Diagnosis not present

## 2018-10-04 ENCOUNTER — Encounter: Payer: Self-pay | Admitting: Internal Medicine

## 2018-10-04 ENCOUNTER — Ambulatory Visit (INDEPENDENT_AMBULATORY_CARE_PROVIDER_SITE_OTHER): Payer: Medicare Other | Admitting: Internal Medicine

## 2018-10-04 VITALS — BP 138/60 | HR 63 | Temp 97.9°F | Ht 63.0 in | Wt 223.0 lb

## 2018-10-04 DIAGNOSIS — N393 Stress incontinence (female) (male): Secondary | ICD-10-CM

## 2018-10-04 DIAGNOSIS — G8929 Other chronic pain: Secondary | ICD-10-CM | POA: Diagnosis not present

## 2018-10-04 DIAGNOSIS — Z6839 Body mass index (BMI) 39.0-39.9, adult: Secondary | ICD-10-CM | POA: Diagnosis not present

## 2018-10-04 DIAGNOSIS — E782 Mixed hyperlipidemia: Secondary | ICD-10-CM | POA: Diagnosis not present

## 2018-10-04 DIAGNOSIS — E1165 Type 2 diabetes mellitus with hyperglycemia: Secondary | ICD-10-CM | POA: Diagnosis not present

## 2018-10-04 DIAGNOSIS — M545 Low back pain, unspecified: Secondary | ICD-10-CM

## 2018-10-04 DIAGNOSIS — E039 Hypothyroidism, unspecified: Secondary | ICD-10-CM

## 2018-10-04 MED ORDER — ONETOUCH ULTRA 2 W/DEVICE KIT: PACK | 0 refills | Status: DC

## 2018-10-04 MED ORDER — ONETOUCH ULTRASOFT LANCETS MISC: 1 refills | Status: DC

## 2018-10-04 MED ORDER — GLUCOSE BLOOD VI STRP: ORAL_STRIP | 1 refills | Status: DC

## 2018-10-04 NOTE — Patient Instructions (Addendum)
Try to cut down on your portions of potatoes.  Increase your veggies and leafy greens with the meat you're eating.    Try taking tylenol 560m on the regular--the most you can use per day is 6 tablets.  Kegel Exercises Kegel exercises help strengthen the muscles that support the rectum, vagina, small intestine, bladder, and uterus. Doing Kegel exercises can help:  Improve bladder and bowel control.  Improve sexual response.  Reduce problems and discomfort during pregnancy.  Kegel exercises involve squeezing your pelvic floor muscles, which are the same muscles you squeeze when you try to stop the flow of urine. The exercises can be done while sitting, standing, or lying down, but it is best to vary your position. Phase 1 exercises 1. Squeeze your pelvic floor muscles tight. You should feel a tight lift in your rectal area. If you are a female, you should also feel a tightness in your vaginal area. Keep your stomach, buttocks, and legs relaxed. 2. Hold the muscles tight for up to 10 seconds. 3. Relax your muscles. Repeat this exercise 50 times a day or as many times as told by your health care provider. Continue to do this exercise for at least 4-6 weeks or for as long as told by your health care provider. This information is not intended to replace advice given to you by your health care provider. Make sure you discuss any questions you have with your health care provider. Document Released: 09/29/2012 Document Revised: 06/07/2016 Document Reviewed: 09/02/2015 Elsevier Interactive Patient Education  2[employer]

## 2018-10-04 NOTE — Progress Notes (Signed)
Location:  [facility] clinic Provider:  Exie Chrismer L. Mariea Clonts, D.O., C.M.D.  Code Status: FULL CODE Goals of Care:  Advanced Directives 09/02/2018  Does Patient Have a Medical Advance Directive? Yes  Type of Advance Directive [place]  Does patient want to make changes to medical advance directive? -  Copy of [place] in Chart? Yes - validated most recent copy scanned in chart (See row information)  Would patient like information on creating a medical advance directive? -   Chief Complaint  Patient presents with  . Medical Management of Chronic Issues    [facility] follow-up    HPI: Patient is a 82y.o. female seen today for medical management of chronic diseases.    She has been on prednisone 11/20 again.  She does avoid bread and pasta.  Does eat a lot of potatoes.  She avoids sugar.  Likes potatoes every way.  She does eat a lot of meat--chicken and fish, just a little beef or pork.  Typically broiled.  Does not often fry food.  She uses her air fryer instead and has an instant pot she uses a lot.  Gets low sodium condensed soup when she cooks with it.  She checks her sugar often when on prednisone--may be up to 250, but trends down.  It was back down to around 140.  Today was 169 today.  Usually 110-120 whatever time she remembers to check (if she walks by her kit).    She is cPsychologist, clinical  She needs new supplies.  New insurance won't cover those strips  She is switching to medicare advantage.    She has tried bladder control medication--myrbetriq did not do her any good.  At one time, she was on something that started with an o. Says it didn't really help either.  Stands up and tinkles.   Having to use a pantiliner now.  Spends more on them now than when she had her period.    Past Medical History:  Diagnosis Date  . Allergy   . Anemia, unspecified   . Arthritis   . Asthma   . Atrial fibrillation status post cardioversion ([facility] 09/2015   s/p  TEE/DCCV>>SR on amio  . Benign neoplasm of colon   . Carpal tunnel syndrome   . Cellulitis and abscess of finger, unspecified   . Cerumen impaction   . Cervicalgia   . CHF (congestive heart failure) ([place]   . Chronic airway obstruction, not elsewhere classified   . Chronic anticoagulation 09/2015   Eliquis  . Diarrhea   . Diffuse cystic mastopathy   . Edema   . Encounter for long-term (current) use of other medications   . GERD (gastroesophageal reflux disease)   . Heart murmur   . Lumbago   . Neck pain 05/23/2015  . Obesity, unspecified   . Other and unspecified hyperlipidemia   . Other psoriasis   . Pain in joint, lower leg   . PONV (postoperative nausea and vomiting)   . Primary localized osteoarthrosis of right shoulder 12/18/2015  . 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 Scoliosis (and kyphoscoliosis), idiopathic   . Shortness of breath dyspnea    with exertion  . Type II or unspecified type diabetes mellitus without mention of complication, uncontrolled   . Unspecified essential hypertension   . Unspecified hemorrhoids without mention of complication   .  Unspecified hypothyroidism   . Urge incontinence     Past Surgical History:  Procedure Laterality Date  . ABDOMINAL HYSTERECTOMY  1987   complete  . BREAST BIOPSY Right   . BREAST EXCISIONAL BIOPSY Left 2011  . BREAST EXCISIONAL BIOPSY Right    x2  . BREAST SURGERY     right breast 3 surgeries [phone]  . CARDIAC CATHETERIZATION N/A 09/02/2016   Procedure: Right/Left Heart Cath and Coronary Angiography;  Surgeon: [name] M Martinique, MD;  Location: [facility];  Service: Cardiovascular;  Laterality: N/A;  . CARDIOVERSION N/A 10/19/2015   Procedure: CARDIOVERSION;  Surgeon: Lelon Perla, MD;  Location: [facility] ENDOSCOPY;  Service: Cardiovascular;  Laterality: N/A;  . CARDIOVERSION  N/A 11/13/2017   Procedure: CARDIOVERSION;  Surgeon: Jerline Pain, MD;  Location: [place];  Service: Cardiovascular;  Laterality: N/A;  . [place]  . COLON SURGERY     2004,2007,2013.  3 times colon surgieres  . ESOPHAGOGASTRODUODENOSCOPY (EGD) WITH PROPOFOL Left 03/13/2017   Procedure: ESOPHAGOGASTRODUODENOSCOPY (EGD) WITH PROPOFOL;  Surgeon: Ronnette Juniper, MD;  Location: [place];  Service: Gastroenterology;  Laterality: Left;  . ESOPHAGOGASTRODUODENOSCOPY (EGD) WITH PROPOFOL Left 01/22/2018   Procedure: ESOPHAGOGASTRODUODENOSCOPY (EGD) WITH PROPOFOL;  Surgeon: Arta Silence, MD;  Location: [facility] ENDOSCOPY;  Service: Endoscopy;  Laterality: Left;  . EXCISE LE MANDIBULAR LYMPH NODE T     DR C. NEWMAN  . FOOT SURGERY  1989  . GIVENS CAPSULE STUDY Left 01/23/2018   Procedure: GIVENS CAPSULE STUDY;  Surgeon: Wilford Corner, MD;  Location: [facility] ENDOSCOPY;  Service: Endoscopy;  Laterality: Left;  . LEFT SHOULDER ARTHROSCOPY    . 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 NEUROPLASTY / TRANSPOSITION MEDIAN NERVE AT CARPAL TUNNEL BILATERAL  05/2003  . TEE WITHOUT CARDIOVERSION N/A 10/19/2015   Procedure: Transesophageal Echocardiogram (TEE) ;  Surgeon: Lelon Perla, MD;  Location: [facility] ENDOSCOPY;  Service: Cardiovascular;  Laterality: N/A;  . TEE WITHOUT CARDIOVERSION N/A 10/14/2016   Procedure: TRANSESOPHAGEAL ECHOCARDIOGRAM (TEE);  Surgeon: Sherren Mocha, MD;  Location: [place];  Service: Open Heart Surgery;  Laterality: N/A;  . TOTAL SHOULDER ARTHROPLASTY Right 12/18/2015   Procedure: TOTAL SHOULDER ARTHROPLASTY;  Surgeon: [name], MD;  Location: [place];  Service: Orthopedics;  Laterality: Right;  . TRANSCATHETER AORTIC VALVE REPLACEMENT, TRANSFEMORAL N/A 10/14/2016   Procedure: TRANSCATHETER AORTIC VALVE REPLACEMENT, TRANSFEMORAL;  Surgeon: Sherren Mocha, MD;  Location: [place];  Service: Open Heart Surgery;  Laterality: N/A;  . TRIGGER FINGER RELEASE Left   . VAGINAL  CYST REMOVED  1967    Allergies  Allergen Reactions  . Codeine Other (See Comments)    "Spaces out" the patient and she cannot walk well  . Vesicare [Solifenacin] Itching    Outpatient Encounter Medications as of 10/04/2018  Medication Sig  . acetaminophen (TYLENOL) 500 MG tablet Take 500 mg by mouth every 6 (six) hours as needed for mild pain.  Marland Kitchen albuterol (PROVENTIL) (2.5 MG/3ML) 0.083% nebulizer solution USE 1 VIAL IN NEBULIZER EVERY 4 HOURS AND AS NEEDED. Generic: VENTOLIN  . albuterol (VENTOLIN HFA) 108 (90 Base) MCG/ACT inhaler Inhale 2 puffs into the lungs every 6 (six) hours as needed for wheezing or shortness of breath.  Marland Kitchen amiodarone (PACERONE) 200 MG tablet Take 200 mg by mouth daily.  . cetirizine (ZYRTEC) 10 MG tablet Take 10 mg by mouth daily as needed for allergies.   . cholecalciferol (VITAMIN D) 1000 units tablet Take 1,000 Units by mouth at  bedtime.   . colestipol (COLESTID) 1 g tablet Take 1 tablet by mouth 2 (two) times daily.  Marland Kitchen doxazosin (CARDURA) 4 MG tablet Take one tablet by mouth once daily to help control blood pressure  . ferrous sulfate 325 (65 FE) MG EC tablet Take 325 mg by mouth 2 (two) times daily.  . fluticasone furoate-vilanterol (BREO ELLIPTA) 200-25 MCG/INH AEPB Inhale 1 puff into the lungs daily.  . furosemide (LASIX) 40 MG tablet Take 2 tablets (80 mg total) by mouth daily.  Marland Kitchen glimepiride (AMARYL) 2 MG tablet Take one tablet by mouth once daily at breakfast to control glucose  . levothyroxine (SYNTHROID, LEVOTHROID) 88 MCG tablet Take 1 tablet (88 mcg total) by mouth daily.  Marland Kitchen losartan (COZAAR) 25 MG tablet TAKE 1 TABLET(25 MG) BY MOUTH DAILY  . pantoprazole (PROTONIX) 40 MG tablet Take 1 tablet (40 mg total) by mouth 2 (two) times daily.  . potassium gluconate 595 (99 K) MG TABS tablet Take 595 mg by mouth See admin instructions. TAKES ONLY WHEN USING FUROSEMIDE  . rosuvastatin (CRESTOR) 10 MG tablet Take 1 tablet (10 mg total) by mouth daily.  Marland Kitchen  triamcinolone (KENALOG) 0.025 % cream APPLY  CREAM TOPICALLY ONCE DAILY AS NEEDED FOR PSORIASIS   No facility-administered encounter medications on file as of 10/04/2018.     Review of Systems:  Review of Systems  Constitutional: Negative for chills, fever and malaise/fatigue.  HENT: Positive for congestion.        Some hoarseness  Eyes: Negative for blurred vision.  Respiratory: Positive for cough. Negative for shortness of breath.   Cardiovascular: Negative for chest pain and palpitations.  Gastrointestinal: Negative for abdominal pain.  Genitourinary: Negative for dysuria.       Urinary leakage when she stands  Musculoskeletal: Positive for back pain and joint pain. Negative for falls.  Neurological: Negative for dizziness and loss of consciousness.  Endo/Heme/Allergies: Bruises/bleeds easily.  Psychiatric/Behavioral: Negative for depression and memory loss. The patient is not nervous/anxious and does not have insomnia.     Health Maintenance  Topic Date Due  . TETANUS/TDAP  10/27/2019 (Originally 08/12/1955)  . FOOT EXAM  12/30/2018  . HEMOGLOBIN A1C  02/15/2019  . OPHTHALMOLOGY EXAM  05/28/2019  . COLONOSCOPY  03/19/2021  . INFLUENZA VACCINE  Completed  . DEXA SCAN  Completed  . PNA vac Low Risk Adult  Completed    Physical Exam: Vitals:   10/04/18 1603  BP: 138/60  Pulse: 63  Temp: 97.9 F (36.6 C)  TempSrc: Oral  SpO2: 93%  Weight: 223 lb (101.2 kg)  Height: _0  (1.6 m)   Body mass index is 39.5 kg/m. Physical Exam  Constitutional: She is oriented to person, place, and time. She appears well-developed and well-nourished. No distress.  HENT:  Head: Normocephalic and atraumatic.  Cardiovascular: Normal rate, regular rhythm and intact distal pulses.  Murmur heard. Pulmonary/Chest: Effort normal and breath sounds normal. No respiratory distress. She has no rales.  Abdominal: Bowel sounds are normal.  Musculoskeletal: Normal range of motion.  Neurological:  She is alert and oriented to person, place, and time.  Skin: Skin is warm and dry. There is pallor.  Psychiatric: She has a normal mood and affect.    Labs reviewed: Basic Metabolic Panel: Recent Labs    11/13/17 0904  01/21/18 0543  04/20/18 0915 08/06/18 1006 08/12/18 1007 08/16/18 0919  NA 140   < > 139   < > 138 140 139 139  K 4.5   < >  3.9   < > 4.7 4.4 4.0 4.2  CL 103   < > 104   < > 99 100 96 98  CO2 24   < > 23   < > _0 32  GLUCOSE 168*   < > 170*   < > 182* 173* 210* 179*  BUN 46*   < > 101*   < > 37* 35* 51* 42*  CREATININE 2.28*   < > 2.31*   < > 1.74* 1.68* 1.69* 1.64*  CALCIUM 9.1   < > 7.6*   < > 9.7 9.0 9.5 9.3  MG 2.1  --   --   --   --   --   --   --   TSH  --    < > 5.553*  --  6.79*  --   --  5.66*   < > = values in this interval not displayed.   Liver Function Tests: Recent Labs    11/12/17 1828  01/20/18 1503 04/20/18 0915 08/16/18 0919  AST 22   < > _1 ALT 21   < > _2 ALKPHOS 58  --  40  --   --   BILITOT 0.8   < > 0.5 0.4 0.6  PROT 7.1   < > 5.8* 6.9 7.0  ALBUMIN 3.7  --  3.0*  --   --    < > = values in this interval not displayed.   Recent Labs    01/20/18 1503  LIPASE 29   No results for input(s): AMMONIA in the last 8760 hours. CBC: Recent Labs    02/22/18 1057 04/20/18 0915 08/06/18 1006 08/16/18 0919  WBC 6.5 5.9 5.5 5.5  NEUTROABS 4.8 4,047  --  3,784  HGB 10.1* 10.9* 10.6* 11.3*  HCT 30.5* 34.1* 32.1* 34.6*  MCV 89.1 86.5 89.1 87.4  PLT 225.0 217 197.0 196   Lipid Panel: Recent Labs    04/20/18 0915 08/16/18 0919  CHOL 198 182  HDL 63 58  LDLCALC 106* 98  TRIG 174* 157*  CHOLHDL 3.1 3.1   Lab Results  Component Value Date   HGBA1C 7.5 (H) 08/16/2018    Assessment/Plan 1. Type 2 diabetes mellitus with hyperglycemia, without long-term current use of insulin (HCC) - switching insurance so new Rx for one touch machine, test strips and lancets given on paper to fill in the new year -cont  same regimen with glimepiride - Hemoglobin A1c; Future  2. Class 2 severe obesity due to excess calories with serious comorbidity and body mass index (BMI) of 39.0 to 39.9 in adult [facility]) - she's aware that her weight is not helping her pains, heart or incontinence - CBC with Differential/Platelet; Future - COMPLETE METABOLIC PANEL WITH GFR; Future - Hemoglobin A1c; Future  3. Stress incontinence -did not improve with oxybutynin or myrbetriq for OAB--seems more stress incontinence with leakage--counseled on kegels and given handout, also recommended regular bladder emptying but patient already goes several times an hour  4. Chronic midline low back pain without sciatica -has been on steroids recently and had an injection but it only lasted a few days--has negatively affected her glucose  5. Hypothyroidism, unspecified type - cont levothyroxine  - TSH; Future  6. Mixed hyperlipidemia - cont crestor which was new at last visit, f/u lab to see if working better than her pravachol - Lipid panel; Future  Labs/tests ordered:   Orders Placed This Encounter  Procedures  . CBC with Differential/Platelet    Standing Status:   Future    Standing Expiration Date:   10/05/2019  . COMPLETE METABOLIC PANEL WITH GFR    Standing Status:   Future    Standing Expiration Date:   10/05/2019  . Hemoglobin A1c    Standing Status:   Future    Standing Expiration Date:   10/05/2019  . Lipid panel    Standing Status:   Future    Standing Expiration Date:   10/05/2019  . TSH    Standing Status:   Future    Standing Expiration Date:   10/05/2019   Next appt:  2 mos with Janett Billow with fasting labs before   Ashanna Heinsohn L. Keirstan Iannello, D.O. [facility] [address], [place] [address] Cell Phone (Mon-Fri 8am-5pm):  [phone] On Call:  [phone] & follow prompts after 5pm & weekends Office Phone:  [phone] Office Fax:  [phone]

## 2018-10-11 ENCOUNTER — Ambulatory Visit
Admission: RE | Admit: 2018-10-11 | Discharge: 2018-10-11 | Disposition: A | Discharge status: Home / Self Care | Payer: Medicare Other | Source: Ambulatory Visit | Attending: Nurse Practitioner | Admitting: Nurse Practitioner

## 2018-10-11 DIAGNOSIS — Z1231 Encounter for screening mammogram for malignant neoplasm of breast: Secondary | ICD-10-CM | POA: Diagnosis not present

## 2018-10-13 DIAGNOSIS — M19011 Primary osteoarthritis, right shoulder: Secondary | ICD-10-CM | POA: Diagnosis not present

## 2018-10-13 DIAGNOSIS — M545 Low back pain: Secondary | ICD-10-CM | POA: Diagnosis not present

## 2018-10-14 ENCOUNTER — Other Ambulatory Visit (HOSPITAL_COMMUNITY): Payer: Self-pay | Admitting: *Deleted

## 2018-10-15 ENCOUNTER — Ambulatory Visit (HOSPITAL_COMMUNITY)
Admission: RE | Admit: 2018-10-15 | Discharge: 2018-10-15 | Disposition: A | Discharge status: Home / Self Care | Payer: Medicare Other | Source: Ambulatory Visit | Attending: Gastroenterology | Admitting: Gastroenterology

## 2018-10-15 DIAGNOSIS — D500 Iron deficiency anemia secondary to blood loss (chronic): Secondary | ICD-10-CM | POA: Insufficient documentation

## 2018-10-15 MED ORDER — SODIUM CHLORIDE 0.9 % IV SOLN
200.0000 mg | Freq: Once | INTRAVENOUS | Status: DC
  Administered 2018-10-15: 200 mg via INTRAVENOUS
  Filled 2018-10-15: qty 10

## 2018-10-31 ENCOUNTER — Other Ambulatory Visit: Payer: Self-pay | Admitting: Nurse Practitioner

## 2018-11-12 ENCOUNTER — Encounter (HOSPITAL_COMMUNITY): Payer: Medicare Other

## 2018-12-02 ENCOUNTER — Other Ambulatory Visit: Payer: Medicare Other

## 2018-12-02 DIAGNOSIS — E039 Hypothyroidism, unspecified: Secondary | ICD-10-CM | POA: Diagnosis not present

## 2018-12-02 DIAGNOSIS — E1165 Type 2 diabetes mellitus with hyperglycemia: Secondary | ICD-10-CM | POA: Diagnosis not present

## 2018-12-02 DIAGNOSIS — E782 Mixed hyperlipidemia: Secondary | ICD-10-CM | POA: Diagnosis not present

## 2018-12-02 DIAGNOSIS — Z6839 Body mass index (BMI) 39.0-39.9, adult: Secondary | ICD-10-CM

## 2018-12-03 LAB — HEMOGLOBIN A1C
Hgb A1c MFr Bld: 8 % of total Hgb — ABNORMAL HIGH (ref <5.7)
Mean Plasma Glucose: 183 (calc)
eAG (mmol/L): 10.1 (calc)

## 2018-12-03 LAB — CBC WITH DIFFERENTIAL/PLATELET
Absolute Monocytes: 476 cells/uL (ref 200–950)
Basophils Absolute: 39 cells/uL (ref 0–200)
Basophils Relative: 0.7 %
Eosinophils Absolute: 101 cells/uL (ref 15–500)
Eosinophils Relative: 1.8 %
HCT: 33 % — ABNORMAL LOW (ref 35.0–45.0)
Hemoglobin: 10.7 g/dL — ABNORMAL LOW (ref 11.7–15.5)
Lymphs Abs: 1282 cells/uL (ref 850–3900)
MCH: 29.2 pg (ref 27.0–33.0)
MCHC: 32.4 g/dL (ref 32.0–36.0)
MCV: 89.9 fL (ref 80.0–100.0)
MPV: 10.9 fL (ref 7.5–12.5)
Monocytes Relative: 8.5 %
Neutro Abs: 3702 cells/uL (ref 1500–7800)
Neutrophils Relative %: 66.1 %
Platelets: 214 10*3/uL (ref 140–400)
RBC: 3.67 10*6/uL — ABNORMAL LOW (ref 3.80–5.10)
RDW: 15.2 % — ABNORMAL HIGH (ref 11.0–15.0)
Total Lymphocyte: 22.9 %
WBC: 5.6 10*3/uL (ref 3.8–10.8)

## 2018-12-03 LAB — COMPLETE METABOLIC PANEL WITH GFR
AG Ratio: 1.6 (calc) (ref 1.0–2.5)
ALT: 17 U/L (ref 6–29)
AST: 17 U/L (ref 10–35)
Albumin: 4.2 g/dL (ref 3.6–5.1)
Alkaline phosphatase (APISO): 56 U/L (ref 37–153)
BUN/Creatinine Ratio: 29 (calc) — ABNORMAL HIGH (ref 6–22)
BUN: 44 mg/dL — ABNORMAL HIGH (ref 7–25)
CO2: 29 mmol/L (ref 20–32)
Calcium: 9.3 mg/dL (ref 8.6–10.4)
Chloride: 99 mmol/L (ref 98–110)
Creat: 1.54 mg/dL — ABNORMAL HIGH (ref 0.60–0.88)
GFR, Est African American: 37 mL/min/{1.73_m2} — ABNORMAL LOW (ref >=60)
GFR, Est Non African American: 32 mL/min/{1.73_m2} — ABNORMAL LOW (ref >=60)
Globulin: 2.6 g/dL (calc) (ref 1.9–3.7)
Glucose, Bld: 178 mg/dL — ABNORMAL HIGH (ref 65–99)
Potassium: 4.6 mmol/L (ref 3.5–5.3)
Sodium: 140 mmol/L (ref 135–146)
Total Bilirubin: 0.4 mg/dL (ref 0.2–1.2)
Total Protein: 6.8 g/dL (ref 6.1–8.1)

## 2018-12-03 LAB — LIPID PANEL
Cholesterol: 140 mg/dL (ref <200)
HDL: 57 mg/dL (ref >=50)
LDL Cholesterol (Calc): 60 mg/dL (calc)
Non-HDL Cholesterol (Calc): 83 mg/dL (calc) (ref <130)
Total CHOL/HDL Ratio: 2.5 (calc) (ref <5.0)
Triglycerides: 148 mg/dL (ref <150)

## 2018-12-03 LAB — TSH: TSH: 5.58 mIU/L — ABNORMAL HIGH (ref 0.40–4.50)

## 2018-12-06 ENCOUNTER — Encounter: Payer: Self-pay | Admitting: Nurse Practitioner

## 2018-12-06 ENCOUNTER — Ambulatory Visit (INDEPENDENT_AMBULATORY_CARE_PROVIDER_SITE_OTHER): Payer: Medicare Other | Admitting: Nurse Practitioner

## 2018-12-06 VITALS — BP 140/70 | HR 66 | Temp 97.6°F | Ht 63.0 in | Wt 229.0 lb

## 2018-12-06 DIAGNOSIS — E039 Hypothyroidism, unspecified: Secondary | ICD-10-CM

## 2018-12-06 DIAGNOSIS — N183 Chronic kidney disease, stage 3 unspecified: Secondary | ICD-10-CM

## 2018-12-06 DIAGNOSIS — E782 Mixed hyperlipidemia: Secondary | ICD-10-CM | POA: Diagnosis not present

## 2018-12-06 DIAGNOSIS — E1165 Type 2 diabetes mellitus with hyperglycemia: Secondary | ICD-10-CM | POA: Diagnosis not present

## 2018-12-06 DIAGNOSIS — I10 Essential (primary) hypertension: Secondary | ICD-10-CM

## 2018-12-06 DIAGNOSIS — D500 Iron deficiency anemia secondary to blood loss (chronic): Secondary | ICD-10-CM

## 2018-12-06 DIAGNOSIS — Z6839 Body mass index (BMI) 39.0-39.9, adult: Secondary | ICD-10-CM

## 2018-12-06 MED ORDER — SITAGLIPTIN PHOSPHATE 50 MG PO TABS: 50.0000 mg | ORAL_TABLET | Freq: Every day | ORAL | 3 refills | Status: DC

## 2018-12-06 NOTE — Patient Instructions (Addendum)
To start chair exercises start slow and increase as you tolerate Cut back on portions and carbohydrates- calories in should be less than calories out.   To start Januvia 50 mg by mouth daily-- to ADD this to glimepiride   Follow up in 3 months with lab work before

## 2018-12-06 NOTE — Progress Notes (Signed)
Careteam: Patient Care Team: [name], NP as PCP - General (Geriatric Medicine) Martinique, [name] M, MD as PCP - Cardiology (Cardiology) [name], MD as Consulting Physician (Orthopedic Surgery) Druscilla Brownie, MD as Consulting Physician (Dermatology) Ralene Bathe, MD as Consulting Physician (Ophthalmology)  Advanced Directive information Does Patient Have a Medical Advance Directive?: Yes, Type of Advance Directive: [place], Does patient want to make changes to medical advance directive?: No - Patient declined  Allergies  Allergen Reactions  . Codeine Other (See Comments)    "Spaces out" the patient and she cannot walk well  . Vesicare [Solifenacin] Itching    Chief Complaint  Patient presents with  . Medical Management of Chronic Issues    2 month follow up, discuss lab results,refused tdap and zoster vaccine     HPI: Patient is a 83 y.o. female seen in the office today to follow up labs.   DM- continues on glimepiride 2 mg daily. Reports she does not drink fluids high in sugar, no candies or desserts. Potatoes are her down fall.  No activity, limited due to pain.   Hypothyroid- TSH marginally elevated at 5.58, continues on synthroid 88 mcg daily which she has been on for years.   Hyperlipidemia- LDL at goal at 60 on crestor 10 mg daily  Anemia- hgb stabe 10.7, going every 2 months for iron transfusion and on iron supplement. Going this week for transfusion.   CKD stage 3- BUN/Cr stable at 44 and 1.54.   htn- always elevated at office, 120s/50s at home   Review of Systems:  Review of Systems  Constitutional: Positive for malaise/fatigue (improved). Negative for chills, fever and weight loss.  HENT: Negative for congestion and tinnitus.   Respiratory: Positive for shortness of breath (hx of asthma, chronic ongoing follow up with pulmonary). Negative for cough and sputum production.        Overall shortness of breath has  been stable and unchanged  Cardiovascular: Negative for chest pain, palpitations and leg swelling.  Gastrointestinal: Positive for diarrhea (goes and comes). Negative for abdominal pain, constipation and heartburn.  Genitourinary: Negative for dysuria, frequency and urgency.  Musculoskeletal: Positive for back pain and joint pain. Negative for falls and myalgias.  Skin: Negative.   Neurological: Negative for dizziness, weakness and headaches.  Endo/Heme/Allergies: Positive for environmental allergies.  Psychiatric/Behavioral: Negative for depression and memory loss. The patient does not have insomnia.    Past Medical History:  Diagnosis Date  . Allergy   . Anemia, unspecified   . Arthritis   . Asthma   . Atrial fibrillation status post cardioversion [facility]) 09/2015   s/p TEE/DCCV>>SR on amio  . Benign neoplasm of colon   . Carpal tunnel syndrome   . Cellulitis and abscess of finger, unspecified   . Cerumen impaction   . Cervicalgia   . CHF (congestive heart failure) ([place])   . Chronic airway obstruction, not elsewhere classified   . Chronic anticoagulation 09/2015   Eliquis  . Diarrhea   . Diffuse cystic mastopathy   . Edema   . Encounter for long-term (current) use of other medications   . GERD (gastroesophageal reflux disease)   . Heart murmur   . Lumbago   . Neck pain 05/23/2015  . Obesity, unspecified   . Other and unspecified hyperlipidemia   . Other psoriasis   . Pain in joint, lower leg   . PONV (postoperative nausea and vomiting)   . Primary localized osteoarthrosis of right  shoulder 12/18/2015  . 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 Scoliosis (and kyphoscoliosis), idiopathic   . Shortness of breath dyspnea    with exertion  . Type II or unspecified type diabetes mellitus without mention of complication, uncontrolled   . Unspecified  essential hypertension   . Unspecified hemorrhoids without mention of complication   . Unspecified hypothyroidism   . Urge incontinence    Past Surgical History:  Procedure Laterality Date  . ABDOMINAL HYSTERECTOMY  1987   complete  . BREAST BIOPSY Right   . BREAST EXCISIONAL BIOPSY Left 2011  . BREAST EXCISIONAL BIOPSY Right    x2  . BREAST SURGERY     right breast 3 surgeries [phone]  . CARDIAC CATHETERIZATION N/A 09/02/2016   Procedure: Right/Left Heart Cath and Coronary Angiography;  Surgeon: [name] M Martinique, MD;  Location: [facility];  Service: Cardiovascular;  Laterality: N/A;  . CARDIOVERSION N/A 10/19/2015   Procedure: CARDIOVERSION;  Surgeon: Lelon Perla, MD;  Location: [facility] ENDOSCOPY;  Service: Cardiovascular;  Laterality: N/A;  . CARDIOVERSION N/A 11/13/2017   Procedure: CARDIOVERSION;  Surgeon: Jerline Pain, MD;  Location: [place];  Service: Cardiovascular;  Laterality: N/A;  . [place]  . COLON SURGERY     2004,2007,2013.  3 times colon surgieres  . ESOPHAGOGASTRODUODENOSCOPY (EGD) WITH PROPOFOL Left 03/13/2017   Procedure: ESOPHAGOGASTRODUODENOSCOPY (EGD) WITH PROPOFOL;  Surgeon: Ronnette Juniper, MD;  Location: [place];  Service: Gastroenterology;  Laterality: Left;  . ESOPHAGOGASTRODUODENOSCOPY (EGD) WITH PROPOFOL Left 01/22/2018   Procedure: ESOPHAGOGASTRODUODENOSCOPY (EGD) WITH PROPOFOL;  Surgeon: Arta Silence, MD;  Location: [facility] ENDOSCOPY;  Service: Endoscopy;  Laterality: Left;  . EXCISE LE MANDIBULAR LYMPH NODE T     DR C. NEWMAN  . FOOT SURGERY  1989  . GIVENS CAPSULE STUDY Left 01/23/2018   Procedure: GIVENS CAPSULE STUDY;  Surgeon: Wilford Corner, MD;  Location: [facility] ENDOSCOPY;  Service: Endoscopy;  Laterality: Left;  . LEFT SHOULDER ARTHROSCOPY    . 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 NEUROPLASTY / TRANSPOSITION MEDIAN NERVE AT CARPAL TUNNEL BILATERAL  05/2003  . TEE WITHOUT CARDIOVERSION N/A 10/19/2015    Procedure: Transesophageal Echocardiogram (TEE) ;  Surgeon: Lelon Perla, MD;  Location: [facility] ENDOSCOPY;  Service: Cardiovascular;  Laterality: N/A;  . TEE WITHOUT CARDIOVERSION N/A 10/14/2016   Procedure: TRANSESOPHAGEAL ECHOCARDIOGRAM (TEE);  Surgeon: Sherren Mocha, MD;  Location: [place];  Service: Open Heart Surgery;  Laterality: N/A;  . TOTAL SHOULDER ARTHROPLASTY Right 12/18/2015   Procedure: TOTAL SHOULDER ARTHROPLASTY;  Surgeon: [name], MD;  Location: [place];  Service: Orthopedics;  Laterality: Right;  . TRANSCATHETER AORTIC VALVE REPLACEMENT, TRANSFEMORAL N/A 10/14/2016   Procedure: TRANSCATHETER AORTIC VALVE REPLACEMENT, TRANSFEMORAL;  Surgeon: Sherren Mocha, MD;  Location: [place];  Service: Open Heart Surgery;  Laterality: N/A;  . TRIGGER FINGER RELEASE Left   . VAGINAL CYST REMOVED  1967   Social History:   reports that she quit smoking about 29 years ago. She has a 40.00 pack-year smoking history. She has never used smokeless tobacco. She reports that she does not drink alcohol or use drugs.  Family History  Problem Relation Age of Onset  . Diabetes Father   . Stroke Father   . Heart disease Father   . Liver cancer Sister   . Heart disease Brother   . Asthma Sister   .  Arthritis Sister   . Breast cancer Neg Hx     Medications: Patient's Medications  New Prescriptions   No medications on file  Previous Medications   ACETAMINOPHEN (TYLENOL) 500 MG TABLET    Take 500 mg by mouth every 6 (six) hours as needed for mild pain.   ALBUTEROL (PROVENTIL) (2.5 MG/3ML) 0.083% NEBULIZER SOLUTION    USE 1 VIAL IN NEBULIZER EVERY 4 HOURS AND AS NEEDED. Generic: VENTOLIN   ALBUTEROL (VENTOLIN HFA) 108 (90 BASE) MCG/ACT INHALER    Inhale 2 puffs into the lungs every 6 (six) hours as needed for wheezing or shortness of breath.   AMIODARONE (PACERONE) 200 MG TABLET    Take 200 mg by mouth daily.   BLOOD GLUCOSE MONITORING SUPPL (ONE TOUCH ULTRA 2) W/DEVICE KIT    Use as instructed to  test blood sugar twice daily DX: E11.9   CETIRIZINE (ZYRTEC) 10 MG TABLET    Take 10 mg by mouth daily as needed for allergies.    CHOLECALCIFEROL (VITAMIN D) 1000 UNITS TABLET    Take 1,000 Units by mouth at bedtime.    COLESTIPOL (COLESTID) 1 G TABLET    Take 1 tablet by mouth 2 (two) times daily.   DOXAZOSIN (CARDURA) 4 MG TABLET    Take one tablet by mouth once daily to help control blood pressure   FERROUS SULFATE 325 (65 FE) MG EC TABLET    Take 325 mg by mouth 2 (two) times daily.   FLUTICASONE FUROATE-VILANTEROL (BREO ELLIPTA) 200-25 MCG/INH AEPB    Inhale 1 puff into the lungs daily.   FUROSEMIDE (LASIX) 40 MG TABLET    Take 2 tablets (80 mg total) by mouth daily.   GLIMEPIRIDE (AMARYL) 2 MG TABLET    Take one tablet by mouth once daily at breakfast to control glucose   GLUCOSE BLOOD (ONE TOUCH ULTRA TEST) TEST STRIP    Use as instructed to test blood sugar twice daily DX: E11.9   LANCETS (ONETOUCH ULTRASOFT) LANCETS    Use as instructed to test blood sugar twice daily DX: E11.9   LEVOTHYROXINE (SYNTHROID, LEVOTHROID) 88 MCG TABLET    TAKE 1 TABLET(88 MCG) BY MOUTH DAILY ON AN EMPTY STOMACH   LOSARTAN (COZAAR) 25 MG TABLET    TAKE 1 TABLET(25 MG) BY MOUTH DAILY   PANTOPRAZOLE (PROTONIX) 40 MG TABLET    Take 1 tablet (40 mg total) by mouth 2 (two) times daily.   POTASSIUM GLUCONATE 595 (99 K) MG TABS TABLET    Take 595 mg by mouth See admin instructions. TAKES ONLY WHEN USING FUROSEMIDE   ROSUVASTATIN (CRESTOR) 10 MG TABLET    Take 1 tablet (10 mg total) by mouth daily.   TRIAMCINOLONE (KENALOG) 0.025 % CREAM    APPLY  CREAM TOPICALLY ONCE DAILY AS NEEDED FOR PSORIASIS  Modified Medications   No medications on file  Discontinued Medications   No medications on file     Physical Exam:  Vitals:   12/06/18 1418  BP: 140/70  Pulse: 66  Temp: 97.6 F (36.4 C)  TempSrc: Oral  SpO2: 92%  Weight: 229 lb (103.9 kg)  Height: _0  (1.6 m)   Body mass index is 40.57  kg/m.  Physical Exam Constitutional:      General: She is not in acute distress.    Appearance: She is well-developed.  HENT:     Head: Normocephalic and atraumatic.  Cardiovascular:     Rate and Rhythm: Normal rate and regular rhythm.  Heart sounds: Murmur present.  Pulmonary:     Effort: Pulmonary effort is normal. No respiratory distress.     Breath sounds: Normal breath sounds. No rales.  Abdominal:     General: Bowel sounds are normal.  Musculoskeletal: Normal range of motion.     Right lower leg: Edema (trace) present.     Left lower leg: Edema (trace) present.  Skin:    General: Skin is warm and dry.     Coloration: Skin is pale.  Neurological:     Mental Status: She is alert and oriented to person, place, and time.     Labs reviewed: Basic Metabolic Panel: Recent Labs    04/20/18 0915  08/12/18 1007 08/16/18 0919 12/02/18 0919  NA 138   < > 139 139 140  K 4.7   < > 4.0 4.2 4.6  CL 99   < > 96 98 99  CO2 30   < > 31 32 29  GLUCOSE 182*   < > 210* 179* 178*  BUN 37*   < > 51* 42* 44*  CREATININE 1.74*   < > 1.69* 1.64* 1.54*  CALCIUM 9.7   < > 9.5 9.3 9.3  TSH 6.79*  --   --  5.66* 5.58*   < > = values in this interval not displayed.   Liver Function Tests: Recent Labs    01/20/18 1503 04/20/18 0915 08/16/18 0919 12/02/18 0919  AST _0 ALT _1 ALKPHOS 40  --   --   --   BILITOT 0.5 0.4 0.6 0.4  PROT 5.8* 6.9 7.0 6.8  ALBUMIN 3.0*  --   --   --    Recent Labs    01/20/18 1503  LIPASE 29   No results for input(s): AMMONIA in the last 8760 hours. CBC: Recent Labs    04/20/18 0915 08/06/18 1006 08/16/18 0919 12/02/18 0919  WBC 5.9 5.5 5.5 5.6  NEUTROABS 4,047  --  3,784 3,702  HGB 10.9* 10.6* 11.3* 10.7*  HCT 34.1* 32.1* 34.6* 33.0*  MCV 86.5 89.1 87.4 89.9  PLT 217 197.0 196 214   Lipid Panel: Recent Labs    04/20/18 0915 08/16/18 0919 12/02/18 0919  CHOL 198 182 140  HDL 63 58 57  LDLCALC 106* 98 60   TRIG 174* 157* 148  CHOLHDL 3.1 3.1 2.5   TSH: Recent Labs    04/20/18 0915 08/16/18 0919 12/02/18 0919  TSH 6.79* 5.66* 5.58*   A1C: Lab Results  Component Value Date   HGBA1C 8.0 (H) 12/02/2018     Assessment/Plan 1. Type 2 diabetes mellitus with hyperglycemia, without long-term current use of insulin (HCC) A1c not at goal. Dietary modifications discussed as well as increasing physical activity (chair exercises as tolerates) -to continue glimepiride and ADD januvia - sitaGLIPtin (JANUVIA) 50 MG tablet; Take 1 tablet (50 mg total) by mouth daily.  Dispense: 30 tablet; Refill: 3  2. Class 2 severe obesity due to excess calories with serious comorbidity and body mass index (BMI) of 39.0 to 39.9 in adult [facility]) -discussed weight loss with dietary modifications and increasing activity.   3. Hypothyroidism, unspecified type TSH remains stable. To continue on synthroid 88 mcg daily  4. Mixed hyperlipidemia LDL at goal, continue statin  5. CKD (chronic kidney disease) stage 3, GFR 30-59 ml/min (HCC) -Encourage proper hydration and to avoid NSAIDS (Aleve, Advil, Motrin, Ibuprofen) with proper control of blood pressure and blood sugar.  6. Essential hypertension Reports blood pressure is always elevated at office, states much better controlled at home and always <140/90. Will continue current regimen  7. Anemia -hgb stable, continues to get iron transfusion and continues on iron supplement   Next appt: 3 months with lab work before  [employer]. [place], [facility] [phone]

## 2018-12-10 ENCOUNTER — Ambulatory Visit (HOSPITAL_COMMUNITY)
Admission: RE | Admit: 2018-12-10 | Discharge: 2018-12-10 | Disposition: A | Discharge status: Home / Self Care | Payer: Medicare Other | Source: Ambulatory Visit | Attending: Gastroenterology | Admitting: Gastroenterology

## 2018-12-10 DIAGNOSIS — D500 Iron deficiency anemia secondary to blood loss (chronic): Secondary | ICD-10-CM | POA: Diagnosis not present

## 2018-12-10 MED ORDER — SODIUM CHLORIDE 0.9 % IV SOLN
200.0000 mg | Freq: Once | INTRAVENOUS | Status: AC
  Administered 2018-12-10: 200 mg via INTRAVENOUS
  Filled 2018-12-10: qty 10

## 2018-12-26 ENCOUNTER — Other Ambulatory Visit: Payer: Self-pay | Admitting: Cardiology

## 2019-01-03 ENCOUNTER — Ambulatory Visit: Payer: Self-pay

## 2019-01-12 ENCOUNTER — Other Ambulatory Visit: Payer: Self-pay

## 2019-01-12 ENCOUNTER — Ambulatory Visit (INDEPENDENT_AMBULATORY_CARE_PROVIDER_SITE_OTHER): Payer: Medicare Other | Admitting: Nurse Practitioner

## 2019-01-12 ENCOUNTER — Encounter: Payer: Self-pay | Admitting: Nurse Practitioner

## 2019-01-12 VITALS — BP 158/64 | HR 65 | Temp 98.0°F | Ht 63.0 in | Wt 228.4 lb

## 2019-01-12 DIAGNOSIS — J441 Chronic obstructive pulmonary disease with (acute) exacerbation: Secondary | ICD-10-CM | POA: Diagnosis not present

## 2019-01-12 DIAGNOSIS — I10 Essential (primary) hypertension: Secondary | ICD-10-CM | POA: Diagnosis not present

## 2019-01-12 MED ORDER — PREDNISONE 10 MG PO TABS: ORAL_TABLET | ORAL | 0 refills | Status: DC

## 2019-01-12 MED ORDER — CEPHALEXIN 500 MG PO CAPS: 500.0000 mg | ORAL_CAPSULE | Freq: Three times a day (TID) | ORAL | 0 refills | Status: DC

## 2019-01-12 NOTE — Progress Notes (Signed)
Careteam: Patient Care Team: [name], NP as PCP - General (Geriatric Medicine) Martinique, [name] M, MD as PCP - Cardiology (Cardiology) [name], MD as Consulting Physician (Orthopedic Surgery) Druscilla Brownie, MD as Consulting Physician (Dermatology) Ralene Bathe, MD as Consulting Physician (Ophthalmology)  Advanced Directive information Does Patient Have a Medical Advance Directive?: Yes, Type of Advance Directive: [place], Does patient want to make changes to medical advance directive?: No - Patient declined  Allergies  Allergen Reactions  . Codeine Other (See Comments)    "Spaces out" the patient and she cannot walk well  . Vesicare [Solifenacin] Itching    Chief Complaint  Patient presents with  . Acute Visit    Patient states she has had this for several weeks, c/o congestion, cough, phlem that doesnt come up, and shortness of breath   . other    Patient also states her daughter has been to beach and granddaughter just came in from [place] a week ago     HPI: Patient is a 83 y.o. female seen in the office today due to increase cough and congestion for several weeks.  Has been dealing with it but was tired of "dealing with it"  Has had this twice before and saw Pulmonary who gave her prednisone and that "knocked it out" (aware this has negative effects on her diabetes)  No fever or chills.  No weight gain or swelling,  Reports chronic shortness of breath, hard to say if it is worse since she has it all the time.  Increase in wheezing. Using albuterol nebulizer 2-3 times daily in the last few weeks. Previously not needing every day Continues to use breo.  Had to use albuterol before coming into visit due to wheezing.   Review of Systems:  Review of Systems  Constitutional: Positive for malaise/fatigue. Negative for chills and fever.  HENT: Negative for congestion, ear pain and sore throat.   Respiratory: Positive for cough,  sputum production, shortness of breath and wheezing.   Cardiovascular: Negative for chest pain, palpitations and leg swelling.  Musculoskeletal: Negative for myalgias.  Skin: Negative for itching and rash.    Past Medical History:  Diagnosis Date  . Allergy   . Anemia, unspecified   . Arthritis   . Asthma   . Atrial fibrillation status post cardioversion [facility]) 09/2015   s/p TEE/DCCV>>SR on amio  . Benign neoplasm of colon   . Carpal tunnel syndrome   . Cellulitis and abscess of finger, unspecified   . Cerumen impaction   . Cervicalgia   . CHF (congestive heart failure) ([place])   . Chronic airway obstruction, not elsewhere classified   . Chronic anticoagulation 09/2015   Eliquis  . Diarrhea   . Diffuse cystic mastopathy   . Edema   . Encounter for long-term (current) use of other medications   . GERD (gastroesophageal reflux disease)   . Heart murmur   . Lumbago   . Neck pain 05/23/2015  . Obesity, unspecified   . Other and unspecified hyperlipidemia   . Other psoriasis   . Pain in joint, lower leg   . PONV (postoperative nausea and vomiting)   . Primary localized osteoarthrosis of right shoulder 12/18/2015  . 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 Scoliosis (and kyphoscoliosis), idiopathic   . Shortness of breath dyspnea    with exertion  . Type  II or unspecified type diabetes mellitus without mention of complication, uncontrolled   . Unspecified essential hypertension   . Unspecified hemorrhoids without mention of complication   . Unspecified hypothyroidism   . Urge incontinence    Past Surgical History:  Procedure Laterality Date  . ABDOMINAL HYSTERECTOMY  1987   complete  . BREAST BIOPSY Right   . BREAST EXCISIONAL BIOPSY Left 2011  . BREAST EXCISIONAL BIOPSY Right    x2  . BREAST SURGERY     right breast 3 surgeries [phone]  .  CARDIAC CATHETERIZATION N/A 09/02/2016   Procedure: Right/Left Heart Cath and Coronary Angiography;  Surgeon: [name] M Martinique, MD;  Location: [place] CV LAB;  Service: Cardiovascular;  Laterality: N/A;  . CARDIOVERSION N/A 10/19/2015   Procedure: CARDIOVERSION;  Surgeon: Lelon Perla, MD;  Location: [facility] ENDOSCOPY;  Service: Cardiovascular;  Laterality: N/A;  . CARDIOVERSION N/A 11/13/2017   Procedure: CARDIOVERSION;  Surgeon: Jerline Pain, MD;  Location: [place];  Service: Cardiovascular;  Laterality: N/A;  . [place]  . COLON SURGERY     2004,2007,2013.  3 times colon surgieres  . ESOPHAGOGASTRODUODENOSCOPY (EGD) WITH PROPOFOL Left 03/13/2017   Procedure: ESOPHAGOGASTRODUODENOSCOPY (EGD) WITH PROPOFOL;  Surgeon: Ronnette Juniper, MD;  Location: [place];  Service: Gastroenterology;  Laterality: Left;  . ESOPHAGOGASTRODUODENOSCOPY (EGD) WITH PROPOFOL Left 01/22/2018   Procedure: ESOPHAGOGASTRODUODENOSCOPY (EGD) WITH PROPOFOL;  Surgeon: Arta Silence, MD;  Location: [facility] ENDOSCOPY;  Service: Endoscopy;  Laterality: Left;  . EXCISE LE MANDIBULAR LYMPH NODE T     DR C. NEWMAN  . FOOT SURGERY  1989  . GIVENS CAPSULE STUDY Left 01/23/2018   Procedure: GIVENS CAPSULE STUDY;  Surgeon: Wilford Corner, MD;  Location: [facility] ENDOSCOPY;  Service: Endoscopy;  Laterality: Left;  . LEFT SHOULDER ARTHROSCOPY    . 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 NEUROPLASTY / TRANSPOSITION MEDIAN NERVE AT CARPAL TUNNEL BILATERAL  05/2003  . TEE WITHOUT CARDIOVERSION N/A 10/19/2015   Procedure: Transesophageal Echocardiogram (TEE) ;  Surgeon: Lelon Perla, MD;  Location: [facility] ENDOSCOPY;  Service: Cardiovascular;  Laterality: N/A;  . TEE WITHOUT CARDIOVERSION N/A 10/14/2016   Procedure: TRANSESOPHAGEAL ECHOCARDIOGRAM (TEE);  Surgeon: Sherren Mocha, MD;  Location: [place];  Service: Open Heart Surgery;  Laterality: N/A;  . TOTAL SHOULDER ARTHROPLASTY Right 12/18/2015   Procedure: TOTAL  SHOULDER ARTHROPLASTY;  Surgeon: [name], MD;  Location: [place];  Service: Orthopedics;  Laterality: Right;  . TRANSCATHETER AORTIC VALVE REPLACEMENT, TRANSFEMORAL N/A 10/14/2016   Procedure: TRANSCATHETER AORTIC VALVE REPLACEMENT, TRANSFEMORAL;  Surgeon: Sherren Mocha, MD;  Location: [place];  Service: Open Heart Surgery;  Laterality: N/A;  . TRIGGER FINGER RELEASE Left   . VAGINAL CYST REMOVED  1967   Social History:   reports that she quit smoking about 29 years ago. She has a 40.00 pack-year smoking history. She has never used smokeless tobacco. She reports that she does not drink alcohol or use drugs.  Family History  Problem Relation Age of Onset  . Diabetes Father   . Stroke Father   . Heart disease Father   . Liver cancer Sister   . Heart disease Brother   . Asthma Sister   . Arthritis Sister   . Breast cancer Neg Hx     Medications: Patient's Medications  New Prescriptions   No medications on file  Previous Medications   ACETAMINOPHEN (TYLENOL) 500 MG TABLET    Take 500 mg by mouth every 6 (six) hours  as needed for mild pain.   ALBUTEROL (PROVENTIL) (2.5 MG/3ML) 0.083% NEBULIZER SOLUTION    USE 1 VIAL IN NEBULIZER EVERY 4 HOURS AND AS NEEDED. Generic: VENTOLIN   ALBUTEROL (VENTOLIN HFA) 108 (90 BASE) MCG/ACT INHALER    Inhale 2 puffs into the lungs every 6 (six) hours as needed for wheezing or shortness of breath.   AMIODARONE (PACERONE) 200 MG TABLET    TAKE 1 TABLET BY MOUTH TWICE DAILY FOR 30 DAYS. THEN REDUCE TO 1 TABLET BY MOUTH EVERY DAY THEREAFTER   BLOOD GLUCOSE MONITORING SUPPL (ONE TOUCH ULTRA 2) W/DEVICE KIT    Use as instructed to test blood sugar twice daily DX: E11.9   CETIRIZINE (ZYRTEC) 10 MG TABLET    Take 10 mg by mouth daily as needed for allergies.    CHOLECALCIFEROL (VITAMIN D) 1000 UNITS TABLET    Take 1,000 Units by mouth at bedtime.    COLESTIPOL (COLESTID) 1 G TABLET    Take 1 tablet by mouth 2 (two) times daily.   DOXAZOSIN (CARDURA) 4 MG  TABLET    Take one tablet by mouth once daily to help control blood pressure   FERROUS SULFATE 325 (65 FE) MG EC TABLET    Take 325 mg by mouth 2 (two) times daily.   FLUTICASONE FUROATE-VILANTEROL (BREO ELLIPTA) 200-25 MCG/INH AEPB    Inhale 1 puff into the lungs daily.   FUROSEMIDE (LASIX) 40 MG TABLET    Take 2 tablets (80 mg total) by mouth daily.   GLIMEPIRIDE (AMARYL) 2 MG TABLET    Take one tablet by mouth once daily at breakfast to control glucose   GLUCOSE BLOOD (ONE TOUCH ULTRA TEST) TEST STRIP    Use as instructed to test blood sugar twice daily DX: E11.9   LANCETS (ONETOUCH ULTRASOFT) LANCETS    Use as instructed to test blood sugar twice daily DX: E11.9   LEVOTHYROXINE (SYNTHROID, LEVOTHROID) 88 MCG TABLET    TAKE 1 TABLET(88 MCG) BY MOUTH DAILY ON AN EMPTY STOMACH   LOSARTAN (COZAAR) 25 MG TABLET    TAKE 1 TABLET(25 MG) BY MOUTH DAILY   PANTOPRAZOLE (PROTONIX) 40 MG TABLET    Take 1 tablet (40 mg total) by mouth 2 (two) times daily.   POTASSIUM GLUCONATE 595 (99 K) MG TABS TABLET    Take 595 mg by mouth See admin instructions. TAKES ONLY WHEN USING FUROSEMIDE   ROSUVASTATIN (CRESTOR) 10 MG TABLET    Take 1 tablet (10 mg total) by mouth daily.   SITAGLIPTIN (JANUVIA) 50 MG TABLET    Take 1 tablet (50 mg total) by mouth daily.   TRIAMCINOLONE (KENALOG) 0.025 % CREAM    APPLY  CREAM TOPICALLY ONCE DAILY AS NEEDED FOR PSORIASIS  Modified Medications   No medications on file  Discontinued Medications   No medications on file     Physical Exam:  Vitals:   01/12/19 1526  BP: (!) 158/64  Pulse: 65  Temp: 98 F (36.7 C)  TempSrc: Oral  SpO2: 95%  Weight: 228 lb 6.4 oz (103.6 kg)  Height: 5' 3" (1.6 m)   Body mass index is 40.46 kg/m.  Physical Exam Constitutional:      General: She is not in acute distress.    Appearance: She is well-developed.  HENT:     Head: Normocephalic and atraumatic.     Right Ear: Tympanic membrane normal.     Left Ear: Tympanic membrane  normal.     Nose: Nose normal.  Mouth/Throat:     Mouth: Mucous membranes are moist.     Pharynx: Oropharynx is clear.  Eyes:     Pupils: Pupils are equal, round, and reactive to light.  Neck:     Musculoskeletal: Normal range of motion and neck supple.  Cardiovascular:     Rate and Rhythm: Normal rate and regular rhythm.     Heart sounds: Murmur present.  Pulmonary:     Effort: Pulmonary effort is normal. No respiratory distress.     Breath sounds: Normal breath sounds. No rales.  Abdominal:     General: Bowel sounds are normal.     Palpations: Abdomen is soft.  Musculoskeletal: Normal range of motion.     Right lower leg: No edema.     Left lower leg: No edema.  Skin:    General: Skin is warm and dry.     Coloration: Skin is pale.  Neurological:     Mental Status: She is alert and oriented to person, place, and time.     Labs reviewed: Basic Metabolic Panel: Recent Labs    04/20/18 0915  08/12/18 1007 08/16/18 0919 12/02/18 0919  NA 138   < > 139 139 140  K 4.7   < > 4.0 4.2 4.6  CL 99   < > 96 98 99  CO2 30   < > 31 32 29  GLUCOSE 182*   < > 210* 179* 178*  BUN 37*   < > 51* 42* 44*  CREATININE 1.74*   < > 1.69* 1.64* 1.54*  CALCIUM 9.7   < > 9.5 9.3 9.3  TSH 6.79*  --   --  5.66* 5.58*   < > = values in this interval not displayed.   Liver Function Tests: Recent Labs    01/20/18 1503 04/20/18 0915 08/16/18 0919 12/02/18 0919  AST _0 ALT _1 ALKPHOS 40  --   --   --   BILITOT 0.5 0.4 0.6 0.4  PROT 5.8* 6.9 7.0 6.8  ALBUMIN 3.0*  --   --   --    Recent Labs    01/20/18 1503  LIPASE 29   No results for input(s): AMMONIA in the last 8760 hours. CBC: Recent Labs    04/20/18 0915 08/06/18 1006 08/16/18 0919 12/02/18 0919  WBC 5.9 5.5 5.5 5.6  NEUTROABS 4,047  --  3,784 3,702  HGB 10.9* 10.6* 11.3* 10.7*  HCT 34.1* 32.1* 34.6* 33.0*  MCV 86.5 89.1 87.4 89.9  PLT 217 197.0 196 214   Lipid Panel: Recent Labs     04/20/18 0915 08/16/18 0919 12/02/18 0919  CHOL 198 182 140  HDL 63 58 57  LDLCALC 106* 98 60  TRIG 174* 157* 148  CHOLHDL 3.1 3.1 2.5   TSH: Recent Labs    04/20/18 0915 08/16/18 0919 12/02/18 0919  TSH 6.79* 5.66* 5.58*   A1C: Lab Results  Component Value Date   HGBA1C 8.0 (H) 12/02/2018     Assessment/Plan 1. COPD exacerbation ([place]) -progressively feeling more short of breath with cough, congestion and wheezing. Needing inhaler more often.  - cephALEXin (KEFLEX) 500 MG capsule; Take 1 capsule (500 mg total) by mouth 3 (three) times daily.  Dispense: 15 capsule; Refill: 0 - predniSONE (DELTASONE) 10 MG tablet; Please take prednisone 40 mg x 3 day, then 30 mg x1 day, then 20 mg x1 day, then 10 mg x1 day, and stop  Dispense: 18 tablet;  Refill: 0 mucinex DM by mouth BID for 7 days with full glass of water To call if symptoms worsen or fail to improve.   2. HTN -elevated today states but reports at home blood pressure is always better. To continue current medication.    Return precautions given.  Next appt: 03/03/2019 Carlos American. [place], [facility] [phone]

## 2019-01-12 NOTE — Patient Instructions (Signed)
cephalexin 56m three times daily x  5 days  Please take prednisone 40 mg x 3 day, then 30 mg x1 day, then 20 mg x1 day, then 10 mg x1 day, and stop  mucinex DM 1 tablet by mouth twice daily with full glass of water   To call if symptoms worsen or fail to improve

## 2019-02-04 ENCOUNTER — Encounter (HOSPITAL_COMMUNITY): Payer: Medicare Other

## 2019-02-28 ENCOUNTER — Telehealth: Payer: Self-pay | Admitting: *Deleted

## 2019-02-28 NOTE — Telephone Encounter (Signed)
02/28/19 LMOM @ 11:58 am.re: follow up appointment.

## 2019-03-01 ENCOUNTER — Other Ambulatory Visit: Payer: Self-pay | Admitting: Nurse Practitioner

## 2019-03-01 DIAGNOSIS — E119 Type 2 diabetes mellitus without complications: Secondary | ICD-10-CM

## 2019-03-01 DIAGNOSIS — R195 Other fecal abnormalities: Secondary | ICD-10-CM

## 2019-03-01 NOTE — Telephone Encounter (Signed)
Patient is requesting refill for Glimepiride 2 mg tab, medication was flagged with a medium allergy alert. Routing to provider for approval

## 2019-03-03 ENCOUNTER — Other Ambulatory Visit: Payer: Medicare Other

## 2019-03-09 ENCOUNTER — Ambulatory Visit: Payer: Medicare Other | Admitting: Nurse Practitioner

## 2019-03-15 ENCOUNTER — Other Ambulatory Visit: Payer: Self-pay | Admitting: Nurse Practitioner

## 2019-03-15 DIAGNOSIS — I10 Essential (primary) hypertension: Secondary | ICD-10-CM

## 2019-03-15 NOTE — Telephone Encounter (Signed)
Patient requesting refill medium allergy / contraindications. Routing to provider to review and sign.

## 2019-03-17 ENCOUNTER — Encounter: Payer: Self-pay | Admitting: Nurse Practitioner

## 2019-03-17 ENCOUNTER — Other Ambulatory Visit: Payer: Self-pay

## 2019-03-17 ENCOUNTER — Telehealth: Payer: Self-pay | Admitting: Internal Medicine

## 2019-03-17 ENCOUNTER — Ambulatory Visit (INDEPENDENT_AMBULATORY_CARE_PROVIDER_SITE_OTHER): Payer: Medicare Other | Admitting: Nurse Practitioner

## 2019-03-17 DIAGNOSIS — J441 Chronic obstructive pulmonary disease with (acute) exacerbation: Secondary | ICD-10-CM | POA: Diagnosis not present

## 2019-03-17 MED ORDER — ALBUTEROL SULFATE (2.5 MG/3ML) 0.083% IN NEBU: INHALATION_SOLUTION | RESPIRATORY_TRACT | 10 refills | Status: DC

## 2019-03-17 MED ORDER — PREDNISONE 10 MG PO TABS: ORAL_TABLET | ORAL | 0 refills | Status: DC

## 2019-03-17 MED ORDER — PREDNISONE 10 MG PO TABS: 20.0000 mg | ORAL_TABLET | Freq: Every day | ORAL | 0 refills | Status: AC

## 2019-03-17 MED ORDER — DOXYCYCLINE HYCLATE 100 MG PO TABS: 100.0000 mg | ORAL_TABLET | Freq: Two times a day (BID) | ORAL | 0 refills | Status: DC

## 2019-03-17 NOTE — Progress Notes (Signed)
Virtual Visit via Telephone Note  I connected with Patricia [name] on 03/17/19 at 11:30 AM EDT by telephone and verified that I am speaking with the correct person using two identifiers.  Location: Patient: home Provider: office   I discussed the limitations, risks, security and privacy concerns of performing an evaluation and management service by telephone and the availability of in person appointments. I also discussed with the patient that there may be a patient responsible charge related to this service. The patient expressed understanding and agreed to proceed.   History of Present Illness: 83 year old female, former smoker (35 pack year hx). PMH COPD, acute on chronic resp failure, obesity hypoventilation syndrome, aortic stenosis, s/p TAVR in 2017, hypertension, afib, diastolic heart failure, GIB. Patient of Dr. Chase Caller, last seen in April 2019 for routine visit. Maintained on BREO 100.  Patient has a tele-visit today for an acute visit.  She states that over the past 5 days she has had increased shortness of breath with wheezing.  She states that she has been compliant with Breo and is using albuterol every 4-6 hours as needed.  She has been compliant with Lasix and is taking 80 mg daily as prescribed.  She denies any recent weight gain and denies any recent lower extremity edema.  She denies any significant cough.  She does feel like she has some chest congestion. Denies f/c/s, n/v/d, hemoptysis, PND, leg swelling.     Observations/Objective:  CXR 08/13/19 - Cardiomegaly with interstitial prominence, likely early interstitial edema. Small effusions and bibasilar atelectasis.  PFT Results Latest Ref Rng & Units 09/16/2016  FVC-Pre L 1.92  FVC-Predicted Pre % 78  FVC-Post L 2.17  FVC-Predicted Post % 88  Pre FEV1/FVC % % 71  Post FEV1/FCV % % 70  FEV1-Pre L 1.36  FEV1-Predicted Pre % 74  FEV1-Post L 1.53  DLCO UNC% % 59  DLCO COR %Predicted % 74  TLC L 3.59  TLC % Predicted  % 75  RV % Predicted % 73     Assessment and Plan: COPD exacerbation: Patient has a tele-visit today for an acute visit.  She states that over the past 5 days she has had increased shortness of breath with wheezing.  She states that she has been compliant with Breo and is using albuterol every 4-6 hours as needed.  She has been compliant with Lasix and is taking 80 mg daily as prescribed.  She denies any recent weight gain and denies any recent lower extremity edema.  She denies any significant cough.  She does feel like she has some chest congestion. Will treat for exacerbation.  Also advised patient to keep a close watch on her weight and monitor for any lower extremity edema.  At this point she states that her weight is stable she does not have any edema.  Patient Instructions  COPD/asthma - Continue BREO take 1 puff daily  - Short course of prednisone 74m x 5 days   Congestive heart failure: - Weight stable - Denies leg swelling - Continue lasix    Follow Up Instructions: Follow up tele-visit in 1 week with APP    I discussed the assessment and treatment plan with the patient. The patient was provided an opportunity to ask questions and all were answered. The patient agreed with the plan and demonstrated an understanding of the instructions.   The patient was advised to call back or seek an in-person evaluation if the symptoms worsen or if the condition fails  to improve as anticipated.  I provided 22 minutes of non-face-to-face time during this encounter.   Fenton Foy, NP

## 2019-03-17 NOTE — Assessment & Plan Note (Signed)
COPD exacerbation: Patient has a tele-visit today for an acute visit.  She states that over the past 5 days she has had increased shortness of breath with wheezing.  She states that she has been compliant with Breo and is using albuterol every 4-6 hours as needed.  She has been compliant with Lasix and is taking 80 mg daily as prescribed.  She denies any recent weight gain and denies any recent lower extremity edema.  She denies any significant cough.  She does feel like she has some chest congestion. Will treat for exacerbation.  Also advised patient to keep a close watch on her weight and monitor for any lower extremity edema.  At this point she states that her weight is stable she does not have any edema.  Patient Instructions  COPD/asthma - Continue BREO take 1 puff daily  - Short course of prednisone 36m x 5 days   Congestive heart failure: - Weight stable - Denies leg swelling - Continue lasix  Follow-up: Follow up tele-visit in 1 week with APP

## 2019-03-17 NOTE — Patient Instructions (Addendum)
COPD/asthma - Continue BREO take 1 puff daily  - Short course of prednisone 37m x 5 days   Congestive heart failure: - Weight stable - Denies leg swelling - Continue lasix  Follow-up: Follow up tele-visit in 1 week with APP

## 2019-03-17 NOTE — Telephone Encounter (Signed)
Spoke with patient. She stated that she has had some increased SOB for the past week. Denied having any coughing or fevers. Has not been around anyone with COVID.   Offered her a televisit with TN for 1130 this morning since she has not had a OV since October 2019. She verbalized understanding.   Nothing further needed at time of call.

## 2019-03-23 ENCOUNTER — Other Ambulatory Visit: Payer: Self-pay

## 2019-03-23 ENCOUNTER — Ambulatory Visit (INDEPENDENT_AMBULATORY_CARE_PROVIDER_SITE_OTHER): Payer: Medicare Other | Admitting: Nurse Practitioner

## 2019-03-23 ENCOUNTER — Encounter: Payer: Self-pay | Admitting: Nurse Practitioner

## 2019-03-23 DIAGNOSIS — J441 Chronic obstructive pulmonary disease with (acute) exacerbation: Secondary | ICD-10-CM | POA: Diagnosis not present

## 2019-03-23 NOTE — Patient Instructions (Addendum)
COPD/asthma - Continue BREO take 1 puff daily  - continue albuterol as needed  Congestive heart failure: - Weight stable - Denies leg swelling - Continue lasix  Follow-up: Follow up with Dr. Chase Caller in 3 months or sooner if needed

## 2019-03-23 NOTE — Assessment & Plan Note (Signed)
COPD flare is resolving. Please complete entire course of prednisone and antibiotic.  Patient Instructions  COPD/asthma - Continue BREO take 1 puff daily  - continue albuterol as needed  Congestive heart failure: - Weight stable - Denies leg swelling - Continue lasix  Follow-up: Follow up with Dr. Chase Caller in 3 months or sooner if needed

## 2019-03-23 NOTE — Progress Notes (Signed)
Virtual Visit via Telephone Note  I connected with Patricia [name] on 03/23/19 at 10:00 AM EDT by telephone and verified that I am speaking with the correct person using two identifiers.  Location: Patient: home Provider: office   I discussed the limitations, risks, security and privacy concerns of performing an evaluation and management service by telephone and the availability of in person appointments. I also discussed with the patient that there may be a patient responsible charge related to this service. The patient expressed understanding and agreed to proceed.   History of Present Illness: 83 year old female, former smoker (35 pack year hx). PMH COPD, acute on chronic resp failure, obesity hypoventilation syndrome, aortic stenosis, s/p TAVR in 2017, hypertension, afib, diastolic heart failure, GIB. Patient of Dr. Chase Caller, last seen in April 2019 for routine visit. Maintained on BREO100.  Patient has a tele-visit today for follow-up.  She was last seen by me on 03/17/2019 and treated for COPD exacerbation with doxycycline and prednisone.  Patient states that she is much improved.  Still has 1 day of doxycycline and prednisone left to complete course.  She states that she no longer has wheezing.  Her breathing is much improved.  She is compliant with Breo and has albuterol as needed.  She denies any recent fever.Denies f/c/s, n/v/d, hemoptysis, PND, leg swelling.    Observations/Objective: CXR 08/13/19 - Cardiomegaly with interstitial prominence, likely early interstitial edema. Small effusions and bibasilar atelectasis.  PFT Results Latest Ref Rng & Units 09/16/2016  FVC-Pre L 1.92  FVC-Predicted Pre % 78  FVC-Post L 2.17  FVC-Predicted Post % 88  Pre FEV1/FVC % % 71  Post FEV1/FCV % % 70  FEV1-Pre L 1.36  FEV1-Predicted Pre % 74  FEV1-Post L 1.53  DLCO UNC% % 59  DLCO COR %Predicted % 74  TLC L 3.59  TLC % Predicted % 75  RV % Predicted % 73     Assessment and  Plan: COPD flare is resolving. Please complete entire course of prednisone and antibiotic.  Patient Instructions  COPD/asthma - Continue BREO take 1 puff daily  - continue albuterol as needed  Congestive heart failure: - Weight stable - Denies leg swelling - Continue lasix    Follow Up Instructions:  Follow up with Dr. Chase Caller in 3 months or sooner if needed    I discussed the assessment and treatment plan with the patient. The patient was provided an opportunity to ask questions and all were answered. The patient agreed with the plan and demonstrated an understanding of the instructions.   The patient was advised to call back or seek an in-person evaluation if the symptoms worsen or if the condition fails to improve as anticipated.  I provided 23 minutes of non-face-to-face time during this encounter.   Fenton Foy, NP

## 2019-04-06 DIAGNOSIS — D509 Iron deficiency anemia, unspecified: Secondary | ICD-10-CM | POA: Diagnosis not present

## 2019-04-08 ENCOUNTER — Telehealth: Payer: Self-pay | Admitting: Nurse Practitioner

## 2019-04-08 ENCOUNTER — Other Ambulatory Visit: Payer: Self-pay | Admitting: Primary Care

## 2019-04-08 DIAGNOSIS — D509 Iron deficiency anemia, unspecified: Secondary | ICD-10-CM | POA: Diagnosis not present

## 2019-04-08 MED ORDER — PREDNISONE 10 MG PO TABS: ORAL_TABLET | ORAL | 0 refills | Status: DC

## 2019-04-08 MED ORDER — DOXYCYCLINE HYCLATE 100 MG PO TABS: 100.0000 mg | ORAL_TABLET | Freq: Two times a day (BID) | ORAL | 0 refills | Status: DC

## 2019-04-08 NOTE — Telephone Encounter (Signed)
Will order prednisone taper. If symptoms worsen over the weekend please go to the ED. Will need follow up after completing prednisone.

## 2019-04-08 NOTE — Telephone Encounter (Signed)
Spoke with pt and informed antibiotic was sent to preferred pharmacy.  Nothing further is needed.

## 2019-04-08 NOTE — Telephone Encounter (Signed)
OK sent in antibiotic. Thanks.

## 2019-04-08 NOTE — Telephone Encounter (Signed)
Pt wanted an antibiotic too. Pt stated that the last few times this has happened she got an antibiotic and she wants one, please.  TN please advise.

## 2019-04-08 NOTE — Telephone Encounter (Signed)
Primary Pulmonologist: MR Last office visit and with whom: 03/23/2019 with Lazaro Arms What do we see them for (pulmonary problems): COPD Last OV assessment/plan: Instructions    Return for follow up. COPD/asthma - ContinueBREO take 1 puff daily  - continue albuterol as needed  Congestive heart failure: -Weight stable -Denies leg swelling -Continue lasix  Follow-up: Follow up with Dr. Chase Caller in 3 months or sooner if needed      Was appointment offered to patient (explain)?  Pt wants recommendations   Reason for call: called and spoke with pt who stated she has had worsening SOB x4 days. Pt has used the rescue inhaler once, and has been using her nebulizer at least 2-3 times daily which she states has given her some relief with her breathing. Pt states that her O2 sats have been ranging from 91-92% on room air.  Pt does have a dry cough which she believes is due to allergies and she is taking zyrtec prn for allergies.  Pt denies any complaints of fever or chills. Pt does have pain in her ribs. Pt is taking breo as prescribed.  Asked pt if she has had to take tylenol, ibuprofen, aleve for any reason and pt stated last night, 6/11 she did take Tylenol PM to help her sleep.  Pt is wanting to know recommendations to help with her symptoms. Tonya, please advise on this for pt. Thanks!

## 2019-04-08 NOTE — Addendum Note (Signed)
Addended by: Fenton Foy on: 04/08/2019 04:25 PM   Modules accepted: Orders

## 2019-04-14 ENCOUNTER — Other Ambulatory Visit (HOSPITAL_COMMUNITY): Payer: Self-pay

## 2019-04-15 ENCOUNTER — Other Ambulatory Visit: Payer: Self-pay

## 2019-04-15 ENCOUNTER — Ambulatory Visit (HOSPITAL_COMMUNITY)
Admission: RE | Admit: 2019-04-15 | Discharge: 2019-04-15 | Disposition: A | Discharge status: Home / Self Care | Payer: Medicare Other | Source: Ambulatory Visit | Attending: Gastroenterology | Admitting: Gastroenterology

## 2019-04-15 ENCOUNTER — Other Ambulatory Visit: Payer: Self-pay | Admitting: Nurse Practitioner

## 2019-04-15 DIAGNOSIS — D500 Iron deficiency anemia secondary to blood loss (chronic): Secondary | ICD-10-CM | POA: Insufficient documentation

## 2019-04-15 DIAGNOSIS — E1165 Type 2 diabetes mellitus with hyperglycemia: Secondary | ICD-10-CM

## 2019-04-15 MED ORDER — SODIUM CHLORIDE 0.9 % IV SOLN
200.0000 mg | Freq: Once | INTRAVENOUS | Status: AC
  Administered 2019-04-15: 11:00:00 200 mg via INTRAVENOUS
  Filled 2019-04-15: qty 10

## 2019-05-22 ENCOUNTER — Other Ambulatory Visit: Payer: Self-pay | Admitting: Nurse Practitioner

## 2019-05-22 DIAGNOSIS — I5032 Chronic diastolic (congestive) heart failure: Secondary | ICD-10-CM

## 2019-05-22 DIAGNOSIS — E119 Type 2 diabetes mellitus without complications: Secondary | ICD-10-CM

## 2019-05-22 DIAGNOSIS — N183 Chronic kidney disease, stage 3 unspecified: Secondary | ICD-10-CM

## 2019-05-23 ENCOUNTER — Other Ambulatory Visit: Payer: Self-pay | Admitting: Nurse Practitioner

## 2019-05-23 DIAGNOSIS — N183 Chronic kidney disease, stage 3 unspecified: Secondary | ICD-10-CM

## 2019-05-23 DIAGNOSIS — I5032 Chronic diastolic (congestive) heart failure: Secondary | ICD-10-CM

## 2019-05-23 DIAGNOSIS — E119 Type 2 diabetes mellitus without complications: Secondary | ICD-10-CM

## 2019-06-09 DIAGNOSIS — D500 Iron deficiency anemia secondary to blood loss (chronic): Secondary | ICD-10-CM | POA: Diagnosis not present

## 2019-06-10 ENCOUNTER — Inpatient Hospital Stay (HOSPITAL_COMMUNITY)
Admission: RE | Admit: 2019-06-10 | Discharge: 2019-06-10 | Disposition: A | Discharge status: Home / Self Care | Payer: Medicare Other | Source: Ambulatory Visit | Attending: Gastroenterology | Admitting: Gastroenterology

## 2019-06-10 ENCOUNTER — Ambulatory Visit (HOSPITAL_COMMUNITY)
Admission: RE | Admit: 2019-06-10 | Discharge: 2019-06-10 | Disposition: A | Discharge status: Home / Self Care | Payer: Medicare Other | Source: Ambulatory Visit | Attending: Gastroenterology | Admitting: Gastroenterology

## 2019-06-10 ENCOUNTER — Other Ambulatory Visit: Payer: Self-pay

## 2019-06-10 DIAGNOSIS — D500 Iron deficiency anemia secondary to blood loss (chronic): Secondary | ICD-10-CM | POA: Diagnosis not present

## 2019-06-10 MED ORDER — SODIUM CHLORIDE 0.9 % IV SOLN
200.0000 mg | Freq: Once | INTRAVENOUS | Status: AC
  Administered 2019-06-10: 200 mg via INTRAVENOUS
  Filled 2019-06-10: qty 10

## 2019-06-23 ENCOUNTER — Other Ambulatory Visit: Payer: Self-pay | Admitting: Cardiology

## 2019-06-23 ENCOUNTER — Other Ambulatory Visit: Payer: Self-pay | Admitting: Nurse Practitioner

## 2019-06-23 DIAGNOSIS — N183 Chronic kidney disease, stage 3 unspecified: Secondary | ICD-10-CM

## 2019-06-23 DIAGNOSIS — E119 Type 2 diabetes mellitus without complications: Secondary | ICD-10-CM

## 2019-06-24 ENCOUNTER — Encounter: Payer: Self-pay | Admitting: Family

## 2019-06-24 ENCOUNTER — Ambulatory Visit (INDEPENDENT_AMBULATORY_CARE_PROVIDER_SITE_OTHER): Payer: Medicare Other | Admitting: Family

## 2019-06-24 ENCOUNTER — Other Ambulatory Visit: Payer: Self-pay

## 2019-06-24 VITALS — BP 133/50 | HR 69 | Ht 63.0 in | Wt 223.0 lb

## 2019-06-24 DIAGNOSIS — Z0000 Encounter for general adult medical examination without abnormal findings: Secondary | ICD-10-CM | POA: Diagnosis not present

## 2019-06-24 NOTE — Patient Instructions (Signed)
Patricia [name] , Thank you for taking time to come for your Medicare Wellness Visit. I appreciate your ongoing commitment to your health goals. Please review the following plan we discussed and let me know if I can assist you in the future.   Screening recommendations/referrals: Colonoscopy: Up to date  Mammogram : Up to date  Bone Density : Up to date  Recommended yearly ophthalmology/optometry visit for glaucoma screening and checkup Recommended yearly dental visit for hygiene and checkup  Vaccinations: Influenza vaccine: Due oct 2020  Pneumococcal vaccine : Up to date  Tdap vaccine: Due 2020  Shingles vaccine: Declined   Advanced directives: Yes   Conditions/risks identified:Advanced age female > 22 Yrs,Type 2 DM,Obesity,dyslipidemia,Hypertension,sedentary lifestyle,Hx of smoking  Next appointment: 1 year    Preventive Care 8 Years and Older, Female Preventive care refers to lifestyle choices and visits with your health care provider that can promote health and wellness. What does preventive care include?  A yearly physical exam. This is also called an annual well check.  Dental exams once or twice a year.  Routine eye exams. Ask your health care provider how often you should have your eyes checked.  Personal lifestyle choices, including:  Daily care of your teeth and gums.  Regular physical activity.  Eating a healthy diet.  Avoiding tobacco and drug use.  Limiting alcohol use.  Practicing safe sex.  Taking low-dose aspirin every day.  Taking vitamin and mineral supplements as recommended by your health care provider. What happens during an annual well check? The services and screenings done by your health care provider during your annual well check will depend on your age, overall health, lifestyle risk factors, and family history of disease. Counseling  Your health care provider may ask you questions about your:  Alcohol use.  Tobacco use.  Drug use.   Emotional well-being.  Home and relationship well-being.  Sexual activity.  Eating habits.  History of falls.  Memory and ability to understand (cognition).  Work and work Statistician.  Reproductive health. Screening  You may have the following tests or measurements:  Height, weight, and BMI.  Blood pressure.  Lipid and cholesterol levels. These may be checked every 5 years, or more frequently if you are over 89 years old.  Skin check.  Lung cancer screening. You may have this screening every year starting at age 71 if you have a 30-pack-year history of smoking and currently smoke or have quit within the past 15 years.  Fecal occult blood test (FOBT) of the stool. You may have this test every year starting at age 20.  Flexible sigmoidoscopy or colonoscopy. You may have a sigmoidoscopy every 5 years or a colonoscopy every 10 years starting at age 49.  Hepatitis C blood test.  Hepatitis B blood test.  Sexually transmitted disease (STD) testing.  Diabetes screening. This is done by checking your blood sugar (glucose) after you have not eaten for a while (fasting). You may have this done every 1-3 years.  Bone density scan. This is done to screen for osteoporosis. You may have this done starting at age 17.  Mammogram. This may be done every 1-2 years. Talk to your health care provider about how often you should have regular mammograms. Talk with your health care provider about your test results, treatment options, and if necessary, the need for more tests. Vaccines  Your health care provider may recommend certain vaccines, such as:  Influenza vaccine. This is recommended every year.  Tetanus, diphtheria, and  acellular pertussis (Tdap, Td) vaccine. You may need a Td booster every 10 years.  Zoster vaccine. You may need this after age 22.  Pneumococcal 13-valent conjugate (PCV13) vaccine. One dose is recommended after age 60.  Pneumococcal polysaccharide (PPSV23)  vaccine. One dose is recommended after age 86. Talk to your health care provider about which screenings and vaccines you need and how often you need them. This information is not intended to replace advice given to you by your health care provider. Make sure you discuss any questions you have with your health care provider. Document Released: 11/09/2015 Document Revised: 07/02/2016 Document Reviewed: 08/14/2015 Elsevier Interactive Patient Education  2017 [place] Prevention in the Home Falls can cause injuries. They can happen to people of all ages. There are many things you can do to make your home safe and to help prevent falls. What can I do on the outside of my home?  Regularly fix the edges of walkways and driveways and fix any cracks.  Remove anything that might make you trip as you walk through a door, such as a raised step or threshold.  Trim any bushes or trees on the path to your home.  Use bright outdoor lighting.  Clear any walking paths of anything that might make someone trip, such as rocks or tools.  Regularly check to see if handrails are loose or broken. Make sure that both sides of any steps have handrails.  Any raised decks and porches should have guardrails on the edges.  Have any leaves, snow, or ice cleared regularly.  Use sand or salt on walking paths during winter.  Clean up any spills in your garage right away. This includes oil or grease spills. What can I do in the bathroom?  Use night lights.  Install grab bars by the toilet and in the tub and shower. Do not use towel bars as grab bars.  Use non-skid mats or decals in the tub or shower.  If you need to sit down in the shower, use a plastic, non-slip stool.  Keep the floor dry. Clean up any water that spills on the floor as soon as it happens.  Remove soap buildup in the tub or shower regularly.  Attach bath mats securely with double-sided non-slip rug tape.  Do not have throw rugs  and other things on the floor that can make you trip. What can I do in the bedroom?  Use night lights.  Make sure that you have a light by your bed that is easy to reach.  Do not use any sheets or blankets that are too big for your bed. They should not hang down onto the floor.  Have a firm chair that has side arms. You can use this for support while you get dressed.  Do not have throw rugs and other things on the floor that can make you trip. What can I do in the kitchen?  Clean up any spills right away.  Avoid walking on wet floors.  Keep items that you use a lot in easy-to-reach places.  If you need to reach something above you, use a strong step stool that has a grab bar.  Keep electrical cords out of the way.  Do not use floor polish or wax that makes floors slippery. If you must use wax, use non-skid floor wax.  Do not have throw rugs and other things on the floor that can make you trip. What can I do with my stairs?  Do not leave any items on the stairs.  Make sure that there are handrails on both sides of the stairs and use them. Fix handrails that are broken or loose. Make sure that handrails are as long as the stairways.  Check any carpeting to make sure that it is firmly attached to the stairs. Fix any carpet that is loose or worn.  Avoid having throw rugs at the top or bottom of the stairs. If you do have throw rugs, attach them to the floor with carpet tape.  Make sure that you have a light switch at the top of the stairs and the bottom of the stairs. If you do not have them, ask someone to add them for you. What else can I do to help prevent falls?  Wear shoes that:  Do not have high heels.  Have rubber bottoms.  Are comfortable and fit you well.  Are closed at the toe. Do not wear sandals.  If you use a stepladder:  Make sure that it is fully opened. Do not climb a closed stepladder.  Make sure that both sides of the stepladder are locked into  place.  Ask someone to hold it for you, if possible.  Clearly mark and make sure that you can see:  Any grab bars or handrails.  First and last steps.  Where the edge of each step is.  Use tools that help you move around (mobility aids) if they are needed. These include:  Canes.  Walkers.  Scooters.  Crutches.  Turn on the lights when you go into a dark area. Replace any light bulbs as soon as they burn out.  Set up your furniture so you have a clear path. Avoid moving your furniture around.  If any of your floors are uneven, fix them.  If there are any pets around you, be aware of where they are.  Review your medicines with your doctor. Some medicines can make you feel dizzy. This can increase your chance of falling. Ask your doctor what other things that you can do to help prevent falls. This information is not intended to replace advice given to you by your health care provider. Make sure you discuss any questions you have with your health care provider. Document Released: 08/09/2009 Document Revised: 03/20/2016 Document Reviewed: 11/17/2014 Elsevier Interactive Patient Education  2017 [employer].

## 2019-06-24 NOTE — Progress Notes (Signed)
Subjective:   Patricia [name] is a 83 y.o. female who presents for Medicare Annual (Subsequent) preventive examination.  Review of Systems:   Cardiac Risk Factors include: advanced age (>64mn, >>28women);diabetes mellitus;obesity (BMI >30kg/m2);dyslipidemia;hypertension;sedentary lifestyle;smoking/ tobacco exposure     Objective:     Vitals: BP (!) 133/50    Pulse 69    Ht _0  (1.6 m)    Wt 223 lb (101.2 kg)    SpO2 93%    BMI 39.50 kg/m   Body mass index is 39.5 kg/m.  Advanced Directives 06/24/2019 01/12/2019 12/06/2018 09/02/2018 04/21/2018 01/20/2018 12/29/2017  Does Patient Have a Medical Advance Directive? _1  No Yes  Type of AIndustrial/product designerof AFreescale SemiconductorPower of [place] Does patient want to make changes to medical advance directive? No - Patient declined No - Patient declined No - Patient declined - - - No - Patient declined  Copy of HBronxin Chart? - Yes - validated most recent copy scanned in chart (See row information) Yes - validated most recent copy scanned in chart (See row information) Yes - validated most recent copy scanned in chart (See row information) Yes - Yes  Would patient like information on creating a medical advance directive? - - - - - No - Patient declined -    Tobacco Social History   Tobacco Use  Smoking Status Former Smoker   Packs/day: 1.00   Years: 40.00   Pack years: 40.00   Quit date: 09/07/1989   Years since quitting: 29.8  Smokeless Tobacco Never Used     Counseling given: Not Answered   Clinical Intake:  Pre-visit preparation completed: No  Pain : No/denies pain     BMI - recorded: 39.5 Nutritional Status: BMI > 30  Obese Nutritional Risks: None Diabetes: Yes CBG done?: No Did pt. bring in CBG monitor from home?: No(120's-180's)  How often  do you need to have someone help you when you read instructions, pamphlets, or other written materials from your doctor or pharmacy?: 1 - Never What is the last grade level you completed in school?: 12 grade  Interpreter Needed?: No  Information entered by :: Taryn Nave FNP-C  Past Medical History:  Diagnosis Date   Allergy    Anemia, unspecified    Arthritis    Asthma    Atrial fibrillation status post cardioversion ([place] 09/2015   s/p TEE/DCCV>>SR on amio   Benign neoplasm of colon    Carpal tunnel syndrome    Cellulitis and abscess of finger, unspecified    Cerumen impaction    Cervicalgia    CHF (congestive heart failure) (HCC)    Chronic airway obstruction, not elsewhere classified    Chronic anticoagulation 09/2015   Eliquis   Diarrhea    Diffuse cystic mastopathy    Edema    Encounter for long-term (current) use of other medications    GERD (gastroesophageal reflux disease)    Heart murmur    Lumbago    Neck pain 05/23/2015   Obesity, unspecified    Other and unspecified hyperlipidemia    Other psoriasis    Pain in joint, lower leg    PONV (postoperative nausea and vomiting)    Primary localized osteoarthrosis of right shoulder 12/18/2015   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   23 mm Edwards Sapien 3 transcatheter  heart valve placed via percutaneous left transfemoral approach   Scoliosis (and kyphoscoliosis), idiopathic    Shortness of breath dyspnea    with exertion   Type II or unspecified type diabetes mellitus without mention of complication, uncontrolled    Unspecified essential hypertension    Unspecified hemorrhoids without mention of complication    Unspecified hypothyroidism    Urge incontinence    Past Surgical History:  Procedure Laterality Date   ABDOMINAL HYSTERECTOMY  1987   complete   BREAST BIOPSY Right    BREAST EXCISIONAL BIOPSY Left 2011   BREAST EXCISIONAL BIOPSY Right      x2   BREAST SURGERY     right breast 3 surgeries 1967,1986,2000   CARDIAC CATHETERIZATION N/A 09/02/2016   Procedure: Right/Left Heart Cath and Coronary Angiography;  Surgeon: [name] M Martinique, MD;  Location: [facility];  Service: Cardiovascular;  Laterality: N/A;   CARDIOVERSION N/A 10/19/2015   Procedure: CARDIOVERSION;  Surgeon: Lelon Perla, MD;  Location: [facility] ENDOSCOPY;  Service: Cardiovascular;  Laterality: N/A;   CARDIOVERSION N/A 11/13/2017   Procedure: CARDIOVERSION;  Surgeon: Jerline Pain, MD;  Location: [facility] ENDOSCOPY;  Service: Cardiovascular;  Laterality: N/A;   [place]     [phone].  3 times colon surgieres   ESOPHAGOGASTRODUODENOSCOPY (EGD) WITH PROPOFOL Left 03/13/2017   Procedure: ESOPHAGOGASTRODUODENOSCOPY (EGD) WITH PROPOFOL;  Surgeon: Ronnette Juniper, MD;  Location: [place];  Service: Gastroenterology;  Laterality: Left;   ESOPHAGOGASTRODUODENOSCOPY (EGD) WITH PROPOFOL Left 01/22/2018   Procedure: ESOPHAGOGASTRODUODENOSCOPY (EGD) WITH PROPOFOL;  Surgeon: Arta Silence, MD;  Location: [facility] ENDOSCOPY;  Service: Endoscopy;  Laterality: Left;   EXCISE LE MANDIBULAR LYMPH NODE T     DR C. 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   NEUROPLASTY / TRANSPOSITION MEDIAN NERVE AT CARPAL TUNNEL BILATERAL  05/2003   TEE WITHOUT CARDIOVERSION N/A 10/19/2015   Procedure: Transesophageal Echocardiogram (TEE) ;  Surgeon: Lelon Perla, MD;  Location: [facility]COPY;  Service: Cardiovascular;  Laterality: N/A;   TEE WITHOUT CARDIOVERSION N/A 10/14/2016   Procedure: TRANSESOPHAGEAL ECHOCARDIOGRAM (TEE);  Surgeon: Sherren Mocha, MD;  Location: [place];  Service: Open Heart Surgery;  Laterality: N/A;    TOTAL SHOULDER ARTHROPLASTY Right 12/18/2015   Procedure: TOTAL SHOULDER ARTHROPLASTY;  Surgeon: [name], MD;  Location: [place];  Service: Orthopedics;  Laterality: Right;   TRANSCATHETER AORTIC VALVE REPLACEMENT, TRANSFEMORAL N/A 10/14/2016   Procedure: TRANSCATHETER AORTIC VALVE REPLACEMENT, TRANSFEMORAL;  Surgeon: Sherren Mocha, MD;  Location: [place];  Service: Open Heart Surgery;  Laterality: N/A;   TRIGGER FINGER RELEASE Left    VAGINAL CYST REMOVED  1967   Family History  Problem Relation Age of Onset   Diabetes Father    Stroke Father    Heart disease Father    Liver cancer Sister    Heart disease Brother    Asthma Sister    Arthritis Sister    Breast cancer Neg Hx    Social History   Socioeconomic History   Marital status: Divorced    Spouse name: Not on file   Number of children: 1   Years of education: Not on file   Highest education level: Not on file  Occupational  History   Occupation: Retired Optician, dispensing strain: Not hard at [employer] insecurity    Worry: Never true    Inability: Never true   Transportation needs    Medical: No    Non-medical: No  Tobacco Use   Smoking status: Former Smoker    Packs/day: 1.00    Years: 40.00    Pack years: 40.00    Quit date: 09/07/1989    Years since quitting: 29.8   Smokeless tobacco: Never Used  Substance and Sexual Activity   Alcohol use: No    Alcohol/week: 0.0 standard drinks   Drug use: No   Sexual activity: Never  Lifestyle   Physical activity    Days per week: 0 days    Minutes per session: 0 min   Stress: To some extent  Relationships   Social connections    Talks on phone: More than three times a week    Gets together: Once a week    Attends religious service: Never    Active member of club or organization: No    Attends meetings of clubs or organizations: Never    Relationship status: Widowed  Other  Topics Concern   Not on file  Social History Narrative   Her daughter & 3 grandchildren live in the area.      Outpatient Encounter Medications as of 06/24/2019  Medication Sig   acetaminophen (TYLENOL) 500 MG tablet Take 500 mg by mouth every 6 (six) hours as needed for mild pain.   albuterol (PROVENTIL) (2.5 MG/3ML) 0.083% nebulizer solution USE 1 VIAL IN NEBULIZER EVERY 4 HOURS AND AS NEEDED. Generic: VENTOLIN   albuterol (VENTOLIN HFA) 108 (90 Base) MCG/ACT inhaler Inhale 2 puffs into the lungs every 6 (six) hours as needed for wheezing or shortness of breath.   amiodarone (PACERONE) 200 MG tablet TAKE 1 TABLET BY MOUTH TWICE DAILY FOR 30 DAYS. THEN REDUCE TO 1 TABLET BY MOUTH EVERY DAY THEREAFTER   Blood Glucose Monitoring Suppl (ONE TOUCH ULTRA 2) w/Device KIT Use as instructed to test blood sugar twice daily DX: E11.9   BREO ELLIPTA 200-25 MCG/INH AEPB INHALE 1 PUFF INTO THE LUNGS DAILY   cetirizine (ZYRTEC) 10 MG tablet Take 10 mg by mouth daily as needed for allergies.    cholecalciferol (VITAMIN D) 1000 units tablet Take 1,000 Units by mouth at bedtime.    colestipol (COLESTID) 1 g tablet Take 1 tablet by mouth 2 (two) times daily.   doxazosin (CARDURA) 4 MG tablet TAKE 1 TABLET BY MOUTH ONCE DAILY TO HELP CONTROL BLOOD PRESSURE   ferrous sulfate 325 (65 FE) MG EC tablet Take 325 mg by mouth 2 (two) times daily.   furosemide (LASIX) 40 MG tablet APPOINTMENT OVERDUE, take 2 by mouth daily   glimepiride (AMARYL) 2 MG tablet TAKE 1 TABLET BY MOUTH ONCE DAILY AT BREAKFAST TO CONTROL GLUCOSE   glucose blood (ONE TOUCH ULTRA TEST) test strip Use as instructed to test blood sugar twice daily DX: E11.9   JANUVIA 50 MG tablet TAKE 1 TABLET(50 MG) BY MOUTH DAILY   Lancets (ONETOUCH ULTRASOFT) lancets Use as instructed to test blood sugar twice daily DX: E11.9   levothyroxine (SYNTHROID) 88 MCG tablet TAKE 1 TABLET BY MOUTH DAILY ON AN EMPTY STOMACH   losartan (COZAAR) 25 MG  tablet TAKE 1 TABLET BY MOUTH DAILY   pantoprazole (PROTONIX) 40 MG tablet TAKE 1 TABLET(40 MG) BY MOUTH  TWICE DAILY   potassium gluconate 595 (99 K) MG TABS tablet Take 595 mg by mouth See admin instructions. TAKES ONLY WHEN USING FUROSEMIDE   rosuvastatin (CRESTOR) 10 MG tablet Take 1 tablet (10 mg total) by mouth daily.   triamcinolone (KENALOG) 0.025 % cream APPLY  CREAM TOPICALLY ONCE DAILY AS NEEDED FOR PSORIASIS   [DISCONTINUED] cephALEXin (KEFLEX) 500 MG capsule Take 1 capsule (500 mg total) by mouth 3 (three) times daily.   [DISCONTINUED] doxycycline (VIBRA-TABS) 100 MG tablet Take 1 tablet (100 mg total) by mouth 2 (two) times daily.   [DISCONTINUED] doxycycline (VIBRA-TABS) 100 MG tablet Take 1 tablet (100 mg total) by mouth 2 (two) times daily.   [DISCONTINUED] predniSONE (DELTASONE) 10 MG tablet Take 4 tabs for 2 days, then 3 tabs for 2 days, then 2 tabs for 2 days, then 1 tab for 2 days, then stop   No facility-administered encounter medications on file as of 06/24/2019.     Activities of Daily Living In your present state of health, do you have any difficulty performing the following activities: 06/24/2019  Hearing? N  Vision? N  Difficulty concentrating or making decisions? N  Walking or climbing stairs? Y  Comment due to both knees and right hip arthritis  Dressing or bathing? N  Doing errands, shopping? N  Preparing Food and eating ? N  Using the Toilet? N  In the past six months, have you accidently leaked urine? Y  Do you have problems with loss of bowel control? N  Managing your Medications? N  Managing your Finances? N  Housekeeping or managing your Housekeeping? N  Some recent data might be hidden    Patient Care Team: [name], NP as PCP - General (Geriatric Medicine) Martinique, [name] M, MD as PCP - Cardiology (Cardiology) [name], MD as Consulting Physician (Orthopedic Surgery) Druscilla Brownie, MD as Consulting Physician  (Dermatology) Ralene Bathe, MD as Consulting Physician (Ophthalmology)    Assessment:   This is a routine wellness examination for [facility].  Exercise Activities and Dietary recommendations Current Exercise Habits: The patient does not participate in regular exercise at present, Exercise limited by: None identified  Goals     <enter goal here>     Starting 09/16/16, I will attempt to eat a heart healthy diet, for the sake of my heart conditions.        Fall Risk Fall Risk  06/24/2019 12/06/2018 10/04/2018 09/02/2018 04/21/2018  Falls in the past year? 1 0 0 0 No  Comment - - - - -  Number falls in past yr: 0 0 0 - -  Injury with Fall? 0 0 0 - -  Risk for fall due to : - - - - -   Is the patient's home free of loose throw rugs in walkways, pet beds, electrical cords, etc?   yes      Grab bars in the bathroom? no      Handrails on the stairs?   yes      Adequate lighting?   yes  Depression Screen PHQ 2/9 Scores 06/24/2019 10/04/2018 12/29/2017 02/18/2017  PHQ - 2 Score 0 0 0 0     Cognitive Function MMSE - Mini Mental State Exam 12/29/2017 09/16/2016  Orientation to time 3 5  Orientation to Place 5 5  Registration 3 3  Attention/ Calculation 5 3  Recall 1 3  Language- name 2 objects 2 2  Language- repeat 1 1  Language- follow 3 step command 3  3  Language- read & follow direction 1 1  Write a sentence 1 1  Copy design 1 1  Total score 26 28     6CIT Screen 06/24/2019  What Year? 0 points  What month? 0 points  What time? 0 points  Count back from 20 0 points  Months in reverse 0 points  Repeat phrase 2 points  Total Score 2    Immunization History  Administered Date(s) Administered   DTaP 08/03/2003   Influenza, High Dose Seasonal PF 06/15/2017, 09/02/2018   Influenza,inj,Quad PF,6+ Mos 09/29/2013, 07/19/2014, 09/26/2015, 09/13/2016   Influenza-Unspecified 11/04/2012   Pneumococcal Conjugate-13 09/26/2015   Pneumococcal Polysaccharide-23 12/01/1993,  08/19/2017    Qualifies for Shingles Vaccine?Decline   Screening Tests Health Maintenance  Topic Date Due   FOOT EXAM  12/30/2018   INFLUENZA VACCINE  05/28/2019   OPHTHALMOLOGY EXAM  05/28/2019   HEMOGLOBIN A1C  06/02/2019   TETANUS/TDAP  10/27/2019 (Originally 08/12/1955)   COLONOSCOPY  03/19/2021   DEXA SCAN  Completed   PNA vac Low Risk Adult  Completed    Cancer Screenings: Lung: Low Dose CT Chest recommended if Age 73-80 years, 30 pack-year currently smoking OR have quit w/in 15years. Patient does not qualify. Breast:  Up to date on Mammogram? No patient will make own appointment  Up to date of Bone Density/Dexa? Yes Colorectal: Up to date   Additional Screenings:  Hepatitis C Screening: Low Risk     Plan:  - Patient declined Tdap vaccine,referral to Podiatrist and Ophthalmology  - she will make an appointment for a Flu shot to be administered at [facility] office.  - she declined to make one year appointment for AWV.  I have personally reviewed and noted the following in the patients chart:    Medical and social history  Use of alcohol, tobacco or illicit drugs   Current medications and supplements  Functional ability and status  Nutritional status  Physical activity  Advanced directives  List of other physicians  Hospitalizations, surgeries, and ER visits in previous 12 months  Vitals  Screenings to include cognitive, depression, and falls  Referrals and appointments  In addition, I have reviewed and discussed with patient certain preventive protocols, quality metrics, and best practice recommendations. A written personalized care plan for preventive services as well as general preventive health recommendations were provided to patient.    Sandrea Hughs, NP  06/24/2019

## 2019-06-24 NOTE — Progress Notes (Signed)
This service is provided via telemedicine  No vital signs collected/recorded due to the encounter was a telemedicine visit.   Location of patient (ex: home, work): Home  Patient consents to a telephone visit:  yes  Location of the provider (ex: office, home): Office   Name of any referring provider:  Sherrie Mustache, NP   Names of all persons participating in the telemedicine service and their role in the encounter: Dinah Ngetich NP, Ruthell Rummage CMA and Tresa Moore   Time spent on call:  Ruthell Rummage CMA, spent  10 minutes on phone with patient

## 2019-06-27 ENCOUNTER — Other Ambulatory Visit: Payer: Self-pay

## 2019-06-27 DIAGNOSIS — D500 Iron deficiency anemia secondary to blood loss (chronic): Secondary | ICD-10-CM | POA: Diagnosis not present

## 2019-06-27 MED ORDER — AMIODARONE HCL 200 MG PO TABS: ORAL_TABLET | ORAL | 0 refills | Status: DC

## 2019-07-07 ENCOUNTER — Other Ambulatory Visit: Payer: Self-pay

## 2019-07-07 ENCOUNTER — Ambulatory Visit (INDEPENDENT_AMBULATORY_CARE_PROVIDER_SITE_OTHER): Payer: Medicare Other | Admitting: Primary Care

## 2019-07-07 ENCOUNTER — Encounter: Payer: Self-pay | Admitting: Primary Care

## 2019-07-07 DIAGNOSIS — R0602 Shortness of breath: Secondary | ICD-10-CM

## 2019-07-07 MED ORDER — PREDNISONE 10 MG PO TABS: ORAL_TABLET | ORAL | 0 refills | Status: DC

## 2019-07-07 NOTE — Progress Notes (Signed)
Virtual Visit via Telephone Note  I connected with Patricia [name] on 07/07/19 at  9:30 AM EDT by telephone and verified that I am speaking with the correct person using two identifiers.  Location: Patient: Homee Provider: Office   I discussed the limitations, risks, security and privacy concerns of performing an evaluation and management service by telephone and the availability of in person appointments. I also discussed with the patient that there may be a patient responsible charge related to this service. The patient expressed understanding and agreed to proceed.   History of Present Illness: 83 year old female, former smoker quit in [place] (40 pack year hx). PMH significant for COPD, obesity hypoventilation syndrome, CHF, Afib, HTN, aortic stenosis, s/p TAVR in 2017, GIB. Patient of Dr. Chase Caller, last seen by pulmonary NP in May 2020. Maintained on Breo 200.  07/07/2019 Patient contacted today for acute televisit. Report increased shortness of breath, wheezing x 5 days. No associated cough or congestion. Using Breo 200 as prescribed, using albuterol nebulizer 4x day with not much improvement. She does not weigh herself daily but reports it styas within 5 lbs. She does have feet swelling. She takes lasix 65m daily as prescribed. Reports that when she gets short of breath her heart rate goes up. Denies sick contact or covid exposure. No chest pain, cough.   Spirometry 08/06/2018- FVC 1.3 (51%), FEV1 0.9 (47%), ratio 69   Observations/Objective:  - Moderate shortness of breath over phone - HR 113  Assessment and Plan:  Dyspnea - Treating for possible COPD exacerbation, although, dyspnea could likey be from CHF as she does not have any cough and reports little improvement from SABA use  - RX prednisone taper (461mx2 days; 3076m 2 days; 19m91m2 days; 10mg70m days) - Checking labs (including BNP) and CXR - patient will complete tomorrow on 9/11 - Advised patient contact cardiology as  well, states that she is overdue for a visit  - ED if HR sustaining >120  Follow Up Instructions:   - FU after labs/CXR resulted or sooner if symptoms worsen  I discussed the assessment and treatment plan with the patient. The patient was provided an opportunity to ask questions and all were answered. The patient agreed with the plan and demonstrated an understanding of the instructions.   The patient was advised to call back or seek an in-person evaluation if the symptoms worsen or if the condition fails to improve as anticipated.  I provided 22 minutes of non-face-to-face time during this encounter.   ElizaMartyn Ehrich

## 2019-07-07 NOTE — Patient Instructions (Signed)
Treating for possible COPD exacerbation, although, dyspnea could likey be from CHF  - RX prednisone taper (1m x2 days; 344mx 2 days; 2051m 2 days; 108m26m2 days) - Checking Labs and CXR on 9/11 - Advised you contact cardiology  - ED if HR sustaining >120

## 2019-07-08 ENCOUNTER — Ambulatory Visit (INDEPENDENT_AMBULATORY_CARE_PROVIDER_SITE_OTHER): Payer: Medicare Other

## 2019-07-08 ENCOUNTER — Telehealth: Payer: Self-pay | Admitting: Primary Care

## 2019-07-08 ENCOUNTER — Other Ambulatory Visit (INDEPENDENT_AMBULATORY_CARE_PROVIDER_SITE_OTHER): Payer: Medicare Other

## 2019-07-08 DIAGNOSIS — R0602 Shortness of breath: Secondary | ICD-10-CM

## 2019-07-08 LAB — BASIC METABOLIC PANEL
BUN: 56 mg/dL — ABNORMAL HIGH (ref 6–23)
CO2: 29 mEq/L (ref 19–32)
Calcium: 9.7 mg/dL (ref 8.4–10.5)
Chloride: 97 mEq/L (ref 96–112)
Creatinine, Ser: 2.23 mg/dL — ABNORMAL HIGH (ref 0.40–1.20)
GFR: 21.1 mL/min — ABNORMAL LOW (ref >60.00)
Glucose, Bld: 262 mg/dL — ABNORMAL HIGH (ref 70–99)
Potassium: 4.8 mEq/L (ref 3.5–5.1)
Sodium: 137 mEq/L (ref 135–145)

## 2019-07-08 LAB — CBC
HCT: 34.7 % — ABNORMAL LOW (ref 36.0–46.0)
Hemoglobin: 11.2 g/dL — ABNORMAL LOW (ref 12.0–15.0)
MCHC: 32.1 g/dL (ref 30.0–36.0)
MCV: 89.6 fl (ref 78.0–100.0)
Platelets: 193 10*3/uL (ref 150.0–400.0)
RBC: 3.88 Mil/uL (ref 3.87–5.11)
RDW: 17.5 % — ABNORMAL HIGH (ref 11.5–15.5)
WBC: 7 10*3/uL (ref 4.0–10.5)

## 2019-07-08 LAB — BRAIN NATRIURETIC PEPTIDE: Pro B Natriuretic peptide (BNP): 1184 pg/mL — ABNORMAL HIGH (ref 0.0–100.0)

## 2019-07-08 NOTE — Telephone Encounter (Signed)
Reports of increased dyspnea without cough/congestion. Using PRN Albuterol with no significant improvement in symptoms. BNP on labs elevated >1,000. Advised she take additional lasix 93m in the afternoon x 5 days and recheck labs next week. She has an apt with cardiology in November. If symptoms acutely worsen present to ED

## 2019-07-15 ENCOUNTER — Other Ambulatory Visit (INDEPENDENT_AMBULATORY_CARE_PROVIDER_SITE_OTHER): Payer: Medicare Other

## 2019-07-15 DIAGNOSIS — R0602 Shortness of breath: Secondary | ICD-10-CM | POA: Diagnosis not present

## 2019-07-15 LAB — BASIC METABOLIC PANEL
BUN: 81 mg/dL — ABNORMAL HIGH (ref 6–23)
CO2: 30 mEq/L (ref 19–32)
Calcium: 9.3 mg/dL (ref 8.4–10.5)
Chloride: 95 mEq/L — ABNORMAL LOW (ref 96–112)
Creatinine, Ser: 2.31 mg/dL — ABNORMAL HIGH (ref 0.40–1.20)
GFR: 20.25 mL/min — ABNORMAL LOW (ref >60.00)
Glucose, Bld: 213 mg/dL — ABNORMAL HIGH (ref 70–99)
Potassium: 3.8 mEq/L (ref 3.5–5.1)
Sodium: 138 mEq/L (ref 135–145)

## 2019-07-15 LAB — BRAIN NATRIURETIC PEPTIDE: Pro B Natriuretic peptide (BNP): 835 pg/mL — ABNORMAL HIGH (ref 0.0–100.0)

## 2019-07-18 NOTE — Progress Notes (Signed)
BNP level has improved from 10 days ago. Have her follow-up with PCP/cardiology for further management. How is she feeling?

## 2019-07-18 NOTE — Progress Notes (Signed)
Thank you

## 2019-07-25 ENCOUNTER — Other Ambulatory Visit: Payer: Self-pay

## 2019-07-25 ENCOUNTER — Ambulatory Visit (INDEPENDENT_AMBULATORY_CARE_PROVIDER_SITE_OTHER): Payer: Medicare Other | Admitting: Nurse Practitioner

## 2019-07-25 ENCOUNTER — Emergency Department (HOSPITAL_COMMUNITY): Payer: Medicare Other

## 2019-07-25 ENCOUNTER — Telehealth: Payer: Self-pay

## 2019-07-25 ENCOUNTER — Inpatient Hospital Stay (HOSPITAL_COMMUNITY)
Admission: EM | Admit: 2019-07-25 | Discharge: 2019-07-29 | DRG: 291 | Disposition: A | Discharge status: Home / Self Care | Payer: Medicare Other | Source: Ambulatory Visit | Attending: Internal Medicine | Admitting: Internal Medicine

## 2019-07-25 ENCOUNTER — Encounter: Payer: Self-pay | Admitting: Nurse Practitioner

## 2019-07-25 ENCOUNTER — Encounter (HOSPITAL_COMMUNITY): Payer: Self-pay | Admitting: Emergency Medicine

## 2019-07-25 VITALS — BP 122/70 | HR 102 | Temp 97.5°F | Ht 63.0 in | Wt 222.8 lb

## 2019-07-25 DIAGNOSIS — N179 Acute kidney failure, unspecified: Secondary | ICD-10-CM | POA: Diagnosis not present

## 2019-07-25 DIAGNOSIS — I5031 Acute diastolic (congestive) heart failure: Secondary | ICD-10-CM | POA: Diagnosis present

## 2019-07-25 DIAGNOSIS — E1122 Type 2 diabetes mellitus with diabetic chronic kidney disease: Secondary | ICD-10-CM | POA: Diagnosis not present

## 2019-07-25 DIAGNOSIS — I10 Essential (primary) hypertension: Secondary | ICD-10-CM | POA: Diagnosis not present

## 2019-07-25 DIAGNOSIS — Z79899 Other long term (current) drug therapy: Secondary | ICD-10-CM

## 2019-07-25 DIAGNOSIS — Z823 Family history of stroke: Secondary | ICD-10-CM

## 2019-07-25 DIAGNOSIS — I959 Hypotension, unspecified: Secondary | ICD-10-CM | POA: Diagnosis not present

## 2019-07-25 DIAGNOSIS — J449 Chronic obstructive pulmonary disease, unspecified: Secondary | ICD-10-CM | POA: Diagnosis present

## 2019-07-25 DIAGNOSIS — Z952 Presence of prosthetic heart valve: Secondary | ICD-10-CM

## 2019-07-25 DIAGNOSIS — Z8249 Family history of ischemic heart disease and other diseases of the circulatory system: Secondary | ICD-10-CM

## 2019-07-25 DIAGNOSIS — E1159 Type 2 diabetes mellitus with other circulatory complications: Secondary | ICD-10-CM | POA: Diagnosis present

## 2019-07-25 DIAGNOSIS — I480 Paroxysmal atrial fibrillation: Secondary | ICD-10-CM | POA: Diagnosis not present

## 2019-07-25 DIAGNOSIS — E119 Type 2 diabetes mellitus without complications: Secondary | ICD-10-CM

## 2019-07-25 DIAGNOSIS — Z8719 Personal history of other diseases of the digestive system: Secondary | ICD-10-CM | POA: Diagnosis not present

## 2019-07-25 DIAGNOSIS — Z7984 Long term (current) use of oral hypoglycemic drugs: Secondary | ICD-10-CM

## 2019-07-25 DIAGNOSIS — E039 Hypothyroidism, unspecified: Secondary | ICD-10-CM | POA: Diagnosis not present

## 2019-07-25 DIAGNOSIS — Z6839 Body mass index (BMI) 39.0-39.9, adult: Secondary | ICD-10-CM

## 2019-07-25 DIAGNOSIS — R0602 Shortness of breath: Secondary | ICD-10-CM | POA: Diagnosis not present

## 2019-07-25 DIAGNOSIS — I340 Nonrheumatic mitral (valve) insufficiency: Secondary | ICD-10-CM | POA: Diagnosis not present

## 2019-07-25 DIAGNOSIS — D638 Anemia in other chronic diseases classified elsewhere: Secondary | ICD-10-CM | POA: Diagnosis present

## 2019-07-25 DIAGNOSIS — Q211 Atrial septal defect: Secondary | ICD-10-CM | POA: Diagnosis not present

## 2019-07-25 DIAGNOSIS — L89151 Pressure ulcer of sacral region, stage 1: Secondary | ICD-10-CM | POA: Diagnosis present

## 2019-07-25 DIAGNOSIS — J90 Pleural effusion, not elsewhere classified: Secondary | ICD-10-CM | POA: Diagnosis not present

## 2019-07-25 DIAGNOSIS — I361 Nonrheumatic tricuspid (valve) insufficiency: Secondary | ICD-10-CM | POA: Diagnosis not present

## 2019-07-25 DIAGNOSIS — R0902 Hypoxemia: Secondary | ICD-10-CM | POA: Diagnosis not present

## 2019-07-25 DIAGNOSIS — Z87891 Personal history of nicotine dependence: Secondary | ICD-10-CM

## 2019-07-25 DIAGNOSIS — L8990 Pressure ulcer of unspecified site, unspecified stage: Secondary | ICD-10-CM | POA: Insufficient documentation

## 2019-07-25 DIAGNOSIS — Z20828 Contact with and (suspected) exposure to other viral communicable diseases: Secondary | ICD-10-CM | POA: Diagnosis present

## 2019-07-25 DIAGNOSIS — I5043 Acute on chronic combined systolic (congestive) and diastolic (congestive) heart failure: Secondary | ICD-10-CM | POA: Diagnosis not present

## 2019-07-25 DIAGNOSIS — N183 Chronic kidney disease, stage 3 unspecified: Secondary | ICD-10-CM | POA: Diagnosis present

## 2019-07-25 DIAGNOSIS — I4891 Unspecified atrial fibrillation: Secondary | ICD-10-CM | POA: Diagnosis not present

## 2019-07-25 DIAGNOSIS — Z96611 Presence of right artificial shoulder joint: Secondary | ICD-10-CM | POA: Diagnosis present

## 2019-07-25 DIAGNOSIS — N189 Chronic kidney disease, unspecified: Secondary | ICD-10-CM

## 2019-07-25 DIAGNOSIS — Z23 Encounter for immunization: Secondary | ICD-10-CM

## 2019-07-25 DIAGNOSIS — R000 Tachycardia, unspecified: Secondary | ICD-10-CM | POA: Diagnosis not present

## 2019-07-25 DIAGNOSIS — Z9049 Acquired absence of other specified parts of digestive tract: Secondary | ICD-10-CM

## 2019-07-25 DIAGNOSIS — J439 Emphysema, unspecified: Secondary | ICD-10-CM | POA: Diagnosis not present

## 2019-07-25 DIAGNOSIS — E1165 Type 2 diabetes mellitus with hyperglycemia: Secondary | ICD-10-CM

## 2019-07-25 DIAGNOSIS — E785 Hyperlipidemia, unspecified: Secondary | ICD-10-CM | POA: Diagnosis not present

## 2019-07-25 DIAGNOSIS — E669 Obesity, unspecified: Secondary | ICD-10-CM | POA: Diagnosis present

## 2019-07-25 DIAGNOSIS — J9621 Acute and chronic respiratory failure with hypoxia: Secondary | ICD-10-CM | POA: Diagnosis not present

## 2019-07-25 DIAGNOSIS — Z793 Long term (current) use of hormonal contraceptives: Secondary | ICD-10-CM

## 2019-07-25 DIAGNOSIS — N184 Chronic kidney disease, stage 4 (severe): Secondary | ICD-10-CM | POA: Diagnosis not present

## 2019-07-25 DIAGNOSIS — I130 Hypertensive heart and chronic kidney disease with heart failure and stage 1 through stage 4 chronic kidney disease, or unspecified chronic kidney disease: Principal | ICD-10-CM | POA: Diagnosis present

## 2019-07-25 DIAGNOSIS — Z90710 Acquired absence of both cervix and uterus: Secondary | ICD-10-CM

## 2019-07-25 DIAGNOSIS — J9601 Acute respiratory failure with hypoxia: Secondary | ICD-10-CM

## 2019-07-25 DIAGNOSIS — Z800 Family history of malignant neoplasm of digestive organs: Secondary | ICD-10-CM

## 2019-07-25 DIAGNOSIS — I2510 Atherosclerotic heart disease of native coronary artery without angina pectoris: Secondary | ICD-10-CM | POA: Diagnosis not present

## 2019-07-25 DIAGNOSIS — Z86010 Personal history of colonic polyps: Secondary | ICD-10-CM

## 2019-07-25 DIAGNOSIS — R079 Chest pain, unspecified: Secondary | ICD-10-CM | POA: Diagnosis not present

## 2019-07-25 DIAGNOSIS — Z8261 Family history of arthritis: Secondary | ICD-10-CM

## 2019-07-25 DIAGNOSIS — I5033 Acute on chronic diastolic (congestive) heart failure: Secondary | ICD-10-CM | POA: Diagnosis present

## 2019-07-25 DIAGNOSIS — I447 Left bundle-branch block, unspecified: Secondary | ICD-10-CM | POA: Diagnosis present

## 2019-07-25 DIAGNOSIS — Z79890 Hormone replacement therapy (postmenopausal): Secondary | ICD-10-CM

## 2019-07-25 DIAGNOSIS — I4819 Other persistent atrial fibrillation: Secondary | ICD-10-CM | POA: Diagnosis not present

## 2019-07-25 DIAGNOSIS — Z833 Family history of diabetes mellitus: Secondary | ICD-10-CM

## 2019-07-25 DIAGNOSIS — K219 Gastro-esophageal reflux disease without esophagitis: Secondary | ICD-10-CM | POA: Diagnosis not present

## 2019-07-25 DIAGNOSIS — Z7951 Long term (current) use of inhaled steroids: Secondary | ICD-10-CM

## 2019-07-25 DIAGNOSIS — Z825 Family history of asthma and other chronic lower respiratory diseases: Secondary | ICD-10-CM

## 2019-07-25 DIAGNOSIS — R0600 Dyspnea, unspecified: Secondary | ICD-10-CM | POA: Diagnosis present

## 2019-07-25 DIAGNOSIS — I110 Hypertensive heart disease with heart failure: Secondary | ICD-10-CM | POA: Diagnosis not present

## 2019-07-25 LAB — CBC WITH DIFFERENTIAL/PLATELET
Abs Immature Granulocytes: 0.02 10*3/uL (ref 0.00–0.07)
Basophils Absolute: 0 10*3/uL (ref 0.0–0.1)
Basophils Relative: 1 %
Eosinophils Absolute: 0 10*3/uL (ref 0.0–0.5)
Eosinophils Relative: 1 %
HCT: 36.4 % (ref 36.0–46.0)
Hemoglobin: 10.9 g/dL — ABNORMAL LOW (ref 12.0–15.0)
Immature Granulocytes: 0 %
Lymphocytes Relative: 9 %
Lymphs Abs: 0.6 10*3/uL — ABNORMAL LOW (ref 0.7–4.0)
MCH: 28.4 pg (ref 26.0–34.0)
MCHC: 29.9 g/dL — ABNORMAL LOW (ref 30.0–36.0)
MCV: 94.8 fL (ref 80.0–100.0)
Monocytes Absolute: 0.5 10*3/uL (ref 0.1–1.0)
Monocytes Relative: 8 %
Neutro Abs: 5 10*3/uL (ref 1.7–7.7)
Neutrophils Relative %: 81 %
Platelets: 166 10*3/uL (ref 150–400)
RBC: 3.84 MIL/uL — ABNORMAL LOW (ref 3.87–5.11)
RDW: 16.6 % — ABNORMAL HIGH (ref 11.5–15.5)
WBC: 6.2 10*3/uL (ref 4.0–10.5)
nRBC: 0 % (ref 0.0–0.2)

## 2019-07-25 LAB — GLUCOSE, CAPILLARY: Glucose-Capillary: 177 mg/dL — ABNORMAL HIGH (ref 70–99)

## 2019-07-25 LAB — CBC
HCT: 36.1 % (ref 36.0–46.0)
Hemoglobin: 10.9 g/dL — ABNORMAL LOW (ref 12.0–15.0)
MCH: 28.1 pg (ref 26.0–34.0)
MCHC: 30.2 g/dL (ref 30.0–36.0)
MCV: 93 fL (ref 80.0–100.0)
Platelets: 168 10*3/uL (ref 150–400)
RBC: 3.88 MIL/uL (ref 3.87–5.11)
RDW: 16.3 % — ABNORMAL HIGH (ref 11.5–15.5)
WBC: 6.4 10*3/uL (ref 4.0–10.5)
nRBC: 0 % (ref 0.0–0.2)

## 2019-07-25 LAB — BASIC METABOLIC PANEL
Anion gap: 16 — ABNORMAL HIGH (ref 5–15)
BUN: 62 mg/dL — ABNORMAL HIGH (ref 8–23)
CO2: 23 mmol/L (ref 22–32)
Calcium: 9 mg/dL (ref 8.9–10.3)
Chloride: 98 mmol/L (ref 98–111)
Creatinine, Ser: 2.59 mg/dL — ABNORMAL HIGH (ref 0.44–1.00)
GFR calc Af Amer: 19 mL/min — ABNORMAL LOW (ref >60)
GFR calc non Af Amer: 17 mL/min — ABNORMAL LOW (ref >60)
Glucose, Bld: 171 mg/dL — ABNORMAL HIGH (ref 70–99)
Potassium: 4.3 mmol/L (ref 3.5–5.1)
Sodium: 137 mmol/L (ref 135–145)

## 2019-07-25 LAB — TROPONIN I (HIGH SENSITIVITY)
Troponin I (High Sensitivity): 29 ng/L — ABNORMAL HIGH (ref <18)
Troponin I (High Sensitivity): 30 ng/L — ABNORMAL HIGH (ref <18)

## 2019-07-25 LAB — BRAIN NATRIURETIC PEPTIDE: B Natriuretic Peptide: 943.8 pg/mL — ABNORMAL HIGH (ref 0.0–100.0)

## 2019-07-25 LAB — HEMOGLOBIN A1C
Hgb A1c MFr Bld: 8 % — ABNORMAL HIGH (ref 4.8–5.6)
Mean Plasma Glucose: 182.9 mg/dL

## 2019-07-25 LAB — SARS CORONAVIRUS 2 BY RT PCR (HOSPITAL ORDER, PERFORMED IN ~~LOC~~ HOSPITAL LAB): SARS Coronavirus 2: NEGATIVE

## 2019-07-25 LAB — CREATININE, SERUM
Creatinine, Ser: 2.54 mg/dL — ABNORMAL HIGH (ref 0.44–1.00)
GFR calc Af Amer: 20 mL/min — ABNORMAL LOW (ref >60)
GFR calc non Af Amer: 17 mL/min — ABNORMAL LOW (ref >60)

## 2019-07-25 MED ORDER — ACETAMINOPHEN 325 MG PO TABS
650.0000 mg | ORAL_TABLET | Freq: Four times a day (QID) | ORAL | Status: DC | PRN
  Administered 2019-07-27 – 2019-07-29 (×2): 650 mg via ORAL
  Filled 2019-07-25 (×2): qty 2

## 2019-07-25 MED ORDER — FUROSEMIDE 10 MG/ML IJ SOLN: 40.0000 mg | Freq: Two times a day (BID) | INTRAMUSCULAR | Status: DC

## 2019-07-25 MED ORDER — ROSUVASTATIN CALCIUM 5 MG PO TABS
10.0000 mg | ORAL_TABLET | Freq: Every day | ORAL | Status: DC
  Administered 2019-07-26 – 2019-07-29 (×4): 10 mg via ORAL
  Filled 2019-07-25 (×4): qty 2

## 2019-07-25 MED ORDER — ONDANSETRON HCL 4 MG/2ML IJ SOLN
4.0000 mg | Freq: Four times a day (QID) | INTRAMUSCULAR | Status: DC | PRN
  Administered 2019-07-27: 4 mg via INTRAVENOUS
  Filled 2019-07-25: qty 2

## 2019-07-25 MED ORDER — ACETAMINOPHEN 650 MG RE SUPP: 650.0000 mg | Freq: Four times a day (QID) | RECTAL | Status: DC | PRN

## 2019-07-25 MED ORDER — DOXAZOSIN MESYLATE 8 MG PO TABS
4.0000 mg | ORAL_TABLET | Freq: Every day | ORAL | Status: DC
  Administered 2019-07-26 – 2019-07-29 (×4): 4 mg via ORAL
  Filled 2019-07-25 (×4): qty 1

## 2019-07-25 MED ORDER — INSULIN ASPART 100 UNIT/ML ~~LOC~~ SOLN
0.0000 [IU] | Freq: Three times a day (TID) | SUBCUTANEOUS | Status: DC
  Administered 2019-07-26 (×2): 3 [IU] via SUBCUTANEOUS
  Administered 2019-07-26: 5 [IU] via SUBCUTANEOUS
  Administered 2019-07-27 (×3): 2 [IU] via SUBCUTANEOUS
  Administered 2019-07-28: 3 [IU] via SUBCUTANEOUS
  Administered 2019-07-28: 1 [IU] via SUBCUTANEOUS
  Administered 2019-07-29: 2 [IU] via SUBCUTANEOUS

## 2019-07-25 MED ORDER — ALBUTEROL SULFATE HFA 108 (90 BASE) MCG/ACT IN AERS
6.0000 | INHALATION_SPRAY | RESPIRATORY_TRACT | Status: DC | PRN
  Filled 2019-07-25: qty 6.7

## 2019-07-25 MED ORDER — METHYLPREDNISOLONE SODIUM SUCC 125 MG IJ SOLR
125.0000 mg | Freq: Once | INTRAMUSCULAR | Status: AC
  Administered 2019-07-25: 19:00:00 125 mg via INTRAVENOUS
  Filled 2019-07-25: qty 2

## 2019-07-25 MED ORDER — LOSARTAN POTASSIUM 50 MG PO TABS: 25.0000 mg | ORAL_TABLET | Freq: Every day | ORAL | Status: DC

## 2019-07-25 MED ORDER — FUROSEMIDE 10 MG/ML IJ SOLN
40.0000 mg | Freq: Once | INTRAMUSCULAR | Status: AC
  Administered 2019-07-25: 40 mg via INTRAVENOUS
  Filled 2019-07-25: qty 4

## 2019-07-25 MED ORDER — LEVOTHYROXINE SODIUM 88 MCG PO TABS
88.0000 ug | ORAL_TABLET | Freq: Every day | ORAL | Status: DC
  Administered 2019-07-26 – 2019-07-29 (×4): 88 ug via ORAL
  Filled 2019-07-25 (×4): qty 1

## 2019-07-25 MED ORDER — PANTOPRAZOLE SODIUM 40 MG PO TBEC
40.0000 mg | DELAYED_RELEASE_TABLET | Freq: Two times a day (BID) | ORAL | Status: DC
  Administered 2019-07-25 – 2019-07-29 (×8): 40 mg via ORAL
  Filled 2019-07-25 (×4): qty 1
  Filled 2019-07-25 (×2): qty 2
  Filled 2019-07-25 (×2): qty 1

## 2019-07-25 MED ORDER — FLUTICASONE FUROATE-VILANTEROL 200-25 MCG/INH IN AEPB
1.0000 | INHALATION_SPRAY | Freq: Every day | RESPIRATORY_TRACT | Status: DC
  Administered 2019-07-26 – 2019-07-29 (×4): 1 via RESPIRATORY_TRACT
  Filled 2019-07-25: qty 28

## 2019-07-25 MED ORDER — POTASSIUM GLUCONATE 595 (99 K) MG PO TABS: 595.0000 mg | ORAL_TABLET | ORAL | Status: DC

## 2019-07-25 MED ORDER — ENOXAPARIN SODIUM 30 MG/0.3ML ~~LOC~~ SOLN
30.0000 mg | SUBCUTANEOUS | Status: DC
  Administered 2019-07-25 – 2019-07-26 (×2): 30 mg via SUBCUTANEOUS
  Filled 2019-07-25 (×2): qty 0.3

## 2019-07-25 MED ORDER — FERROUS SULFATE 325 (65 FE) MG PO TABS
325.0000 mg | ORAL_TABLET | Freq: Two times a day (BID) | ORAL | Status: DC
  Administered 2019-07-25 – 2019-07-29 (×8): 325 mg via ORAL
  Filled 2019-07-25 (×8): qty 1

## 2019-07-25 MED ORDER — AMIODARONE HCL 200 MG PO TABS
200.0000 mg | ORAL_TABLET | Freq: Every day | ORAL | Status: DC
  Administered 2019-07-26 – 2019-07-29 (×4): 200 mg via ORAL
  Filled 2019-07-25 (×4): qty 1

## 2019-07-25 MED ORDER — ALBUTEROL SULFATE (2.5 MG/3ML) 0.083% IN NEBU
2.5000 mg | INHALATION_SOLUTION | RESPIRATORY_TRACT | Status: DC | PRN
  Administered 2019-07-26: 2.5 mg via RESPIRATORY_TRACT
  Filled 2019-07-25: qty 3

## 2019-07-25 MED ORDER — ONDANSETRON HCL 4 MG PO TABS: 4.0000 mg | ORAL_TABLET | Freq: Four times a day (QID) | ORAL | Status: DC | PRN

## 2019-07-25 MED ORDER — IPRATROPIUM BROMIDE HFA 17 MCG/ACT IN AERS
2.0000 | INHALATION_SPRAY | Freq: Once | RESPIRATORY_TRACT | Status: AC
  Administered 2019-07-25: 2 via RESPIRATORY_TRACT
  Filled 2019-07-25: qty 12.9

## 2019-07-25 MED ORDER — COLESTIPOL HCL 1 G PO TABS: 1.0000 g | ORAL_TABLET | Freq: Two times a day (BID) | ORAL | Status: DC

## 2019-07-25 NOTE — Telephone Encounter (Signed)
Per [name], NP please call Dr.Jordan's office and see if patient can be seen sooner for worsening shortness of breath and elevated heart rate related to albuterol use.   Spoke with Alwyn Ren, patient can see a PA sooner that pending appointment in November with Dr.Jordan.   Patient scheduled to See PA-Kilroy tomorrow at 8:30 am, arrive at 8:15 am.   Patient aware of appointment date and time. Appointment will also show on patients check out papers (AVS) from today's visit with [name], NP

## 2019-07-25 NOTE — ED Provider Notes (Signed)
[facility] EMERGENCY DEPARTMENT Provider Note   CSN: [id number] Arrival date & time: 07/25/19  1740     History   Chief Complaint No chief complaint on file.   HPI Patricia [name] is a 83 y.o. female.     Patient with a history of paroxysmal A. fib currently controlled on amiodarone, history of heart valve replacement by TAVR, HFpEF on Lasix, COPD on nebulizer treatments, non-oxygen dependent at home --presents to the emergency department today with complaint of worsening shortness of breath.  Patient states that she has been more short of breath than baseline over the past month.  She saw her doctor today for several different problems and was found to have a low pulse ox down into the 80s at rest.  She was found to be in atrial fibrillation but a slightly high rate.  She was transported to the emergency department by ambulance.  Patient states that her breathing has interfered severely with her ADLs.  She has been using 1 of her inhalers more frequently.  She denies any fevers, cough, chest pain.  No nausea, vomiting, or diarrhea.  No lower extremity swelling.  Patient reports being on a burst of steroids several weeks ago (8 days starting 07/07/2019 by her pulmonologist), not currently.  She states this helped temporarily but she cannot be on it long-term because of her diabetes.  The onset of this condition was acute. The course is constant. Aggravating factors: none. Alleviating factors: none.       Past Medical History:  Diagnosis Date  . Allergy   . Anemia, unspecified   . Arthritis   . Asthma   . Atrial fibrillation status post cardioversion [facility]) 09/2015   s/p TEE/DCCV>>SR on amio  . Benign neoplasm of colon   . Carpal tunnel syndrome   . Cellulitis and abscess of finger, unspecified   . Cerumen impaction   . Cervicalgia   . CHF (congestive heart failure) ([place])   . Chronic airway obstruction, not elsewhere classified   . Chronic anticoagulation 09/2015    Eliquis  . Diarrhea   . Diffuse cystic mastopathy   . Edema   . Encounter for long-term (current) use of other medications   . GERD (gastroesophageal reflux disease)   . Heart murmur   . Lumbago   . Neck pain 05/23/2015  . Obesity, unspecified   . Other and unspecified hyperlipidemia   . Other psoriasis   . Pain in joint, lower leg   . PONV (postoperative nausea and vomiting)   . Primary localized osteoarthrosis of right shoulder 12/18/2015  . 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 Scoliosis (and kyphoscoliosis), idiopathic   . Shortness of breath dyspnea    with exertion  . Type II or unspecified type diabetes mellitus without mention of complication, uncontrolled   . Unspecified essential hypertension   . Unspecified hemorrhoids without mention of complication   . Unspecified hypothyroidism   . Urge incontinence     Patient Active Problem List   Diagnosis Date Noted  . COPD with acute exacerbation ([place]) 03/17/2019  . GI bleed 01/20/2018  . Acute renal failure superimposed on stage 3 chronic kidney disease ([place]) 01/20/2018  . UGIB (upper gastrointestinal bleed) 03/11/2017  . AKI (acute kidney injury) ([place]) 03/11/2017  . Lung nodule 01/26/2017  . Macrocytosis 01/20/2017  . Hypoxemia 10/24/2016  . Asthma 10/24/2016  .  Hypersomnia 10/24/2016  . Obesity hypoventilation syndrome ([place]) 10/24/2016  . 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 Aortic valve stenosis 09/11/2016  . Acute blood loss anemia 02/06/2016  . Acute on chronic diastolic heart failure ([place]) 02/06/2016  . PAF (paroxysmal atrial fibrillation) ([place]) 02/05/2016  . Primary localized osteoarthrosis of right shoulder 12/18/2015  . Osteoarthritis of right shoulder 12/18/2015  . Post-nasal drainage 11/27/2015  . Anemia 09/26/2015  . Neck pain 05/23/2015  . Knee  pain, bilateral 07/20/2014  . Lumbago 11/02/2013  . Trigger finger, acquired 11/02/2013  . Hyperlipidemia 11/02/2013  . Urinary incontinence 11/02/2013  . Rash and nonspecific skin eruption 11/02/2013  . COPD (chronic obstructive pulmonary disease) ([place])   . Hypothyroidism   . Essential hypertension 03/02/2013  . DM type 2 (diabetes mellitus, type 2) ([place]) 03/02/2013    Past Surgical History:  Procedure Laterality Date  . ABDOMINAL HYSTERECTOMY  1987   complete  . BREAST BIOPSY Right   . BREAST EXCISIONAL BIOPSY Left 2011  . BREAST EXCISIONAL BIOPSY Right    x2  . BREAST SURGERY     right breast 3 surgeries [phone]  . CARDIAC CATHETERIZATION N/A 09/02/2016   Procedure: Right/Left Heart Cath and Coronary Angiography;  Surgeon: [name] M Martinique, MD;  Location: [name] CV LAB;  Service: Cardiovascular;  Laterality: N/A;  . CARDIOVERSION N/A 10/19/2015   Procedure: CARDIOVERSION;  Surgeon: Lelon Perla, MD;  Location: [facility] ENDOSCOPY;  Service: Cardiovascular;  Laterality: N/A;  . CARDIOVERSION N/A 11/13/2017   Procedure: CARDIOVERSION;  Surgeon: Jerline Pain, MD;  Location: [facility];  Service: Cardiovascular;  Laterality: N/A;  . [place]  . COLON SURGERY     2004,2007,2013.  3 times colon surgieres  . ESOPHAGOGASTRODUODENOSCOPY (EGD) WITH PROPOFOL Left 03/13/2017   Procedure: ESOPHAGOGASTRODUODENOSCOPY (EGD) WITH PROPOFOL;  Surgeon: Ronnette Juniper, MD;  Location: [place];  Service: Gastroenterology;  Laterality: Left;  . ESOPHAGOGASTRODUODENOSCOPY (EGD) WITH PROPOFOL Left 01/22/2018   Procedure: ESOPHAGOGASTRODUODENOSCOPY (EGD) WITH PROPOFOL;  Surgeon: Arta Silence, MD;  Location: [facility] ENDOSCOPY;  Service: Endoscopy;  Laterality: Left;  . EXCISE LE MANDIBULAR LYMPH NODE T     DR C. NEWMAN  . FOOT SURGERY  1989  . GIVENS CAPSULE STUDY Left 01/23/2018   Procedure: GIVENS CAPSULE STUDY;  Surgeon: Wilford Corner, MD;  Location[facility] ENDOSCOPY;   Service: Endoscopy;  Laterality: Left;  . LEFT SHOULDER ARTHROSCOPY    . 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 NEUROPLASTY / TRANSPOSITION MEDIAN NERVE AT CARPAL TUNNEL BILATERAL  05/2003  . TEE WITHOUT CARDIOVERSION N/A 10/19/2015   Procedure: Transesophageal Echocardiogram (TEE) ;  Surgeon: Lelon Perla, MD;  Location: [facility] ENDOSCOPY;  Service: Cardiovascular;  Laterality: N/A;  . TEE WITHOUT CARDIOVERSION N/A 10/14/2016   Procedure: TRANSESOPHAGEAL ECHOCARDIOGRAM (TEE);  Surgeon: Sherren Mocha, MD;  Location: [facility];  Service: Open Heart Surgery;  Laterality: N/A;  . TOTAL SHOULDER ARTHROPLASTY Right 12/18/2015   Procedure: TOTAL SHOULDER ARTHROPLASTY;  Surgeon: [name], MD;  Location: [facility];  Service: Orthopedics;  Laterality: Right;  . TRANSCATHETER AORTIC VALVE REPLACEMENT, TRANSFEMORAL N/A 10/14/2016   Procedure: TRANSCATHETER AORTIC VALVE REPLACEMENT, TRANSFEMORAL;  Surgeon: Sherren Mocha, MD;  Location: [facility];  Service: Open Heart Surgery;  Laterality: N/A;  . TRIGGER FINGER RELEASE Left   . VAGINAL CYST REMOVED  1967     OB History   No obstetric history on file.      Home Medications    Prior to  Admission medications   Medication Sig Start Date End Date Taking? Authorizing Provider  acetaminophen (TYLENOL) 500 MG tablet Take 500 mg by mouth every 6 (six) hours as needed for mild pain.    [provider]  albuterol (PROVENTIL) (2.5 MG/3ML) 0.083% nebulizer solution USE 1 VIAL IN NEBULIZER EVERY 4 HOURS AND AS NEEDED. Generic: VENTOLIN 03/17/19   Fenton Foy, NP  albuterol (VENTOLIN HFA) 108 (90 Base) MCG/ACT inhaler Inhale 2 puffs into the lungs every 6 (six) hours as needed for wheezing or shortness of breath. 11/19/17   Brand Males, MD  amiodarone (PACERONE) 200 MG tablet TAKE 1 TABLET BY MOUTH TWICE DAILY FOR 30 DAYS. THEN REDUCE TO 1 TABLET BY MOUTH EVERY DAY THEREAFTER 06/27/19   Martinique, [name] M, MD  Blood Glucose Monitoring Suppl (ONE TOUCH ULTRA 2)  w/Device KIT Use as instructed to test blood sugar twice daily DX: E11.9 10/04/18   Reed, Tiffany L, DO  BREO ELLIPTA 200-25 MCG/INH AEPB INHALE 1 PUFF INTO THE LUNGS DAILY 04/08/19   Martyn Ehrich, NP  cetirizine (ZYRTEC) 10 MG tablet Take 10 mg by mouth daily as needed for allergies.     [provider]  cholecalciferol (VITAMIN D) 1000 units tablet Take 1,000 Units by mouth at bedtime.     [provider]  colestipol (COLESTID) 1 g tablet Take 1 tablet by mouth 2 (two) times daily. 04/19/18   [provider]  doxazosin (CARDURA) 4 MG tablet TAKE 1 TABLET BY MOUTH ONCE DAILY TO HELP CONTROL BLOOD PRESSURE 03/15/19   [name], NP  ferrous sulfate 325 (65 FE) MG EC tablet Take 325 mg by mouth 2 (two) times daily.    [provider]  furosemide (LASIX) 40 MG tablet APPOINTMENT OVERDUE, take 2 by mouth daily 05/23/19   [name], NP  glimepiride (AMARYL) 2 MG tablet TAKE 1 TABLET BY MOUTH ONCE DAILY AT BREAKFAST TO CONTROL GLUCOSE 03/03/19   [name], NP  glucose blood (ONE TOUCH ULTRA TEST) test strip Use as instructed to test blood sugar twice daily DX: E11.9 10/04/18   Reed, Tiffany L, DO  JANUVIA 50 MG tablet TAKE 1 TABLET(50 MG) BY MOUTH DAILY 04/15/19   [name], NP  Lancets [facility] ULTRASOFT) lancets Use as instructed to test blood sugar twice daily DX: E11.9 10/04/18   Reed, Tiffany L, DO  levothyroxine (SYNTHROID) 88 MCG tablet TAKE 1 TABLET BY MOUTH DAILY ON AN EMPTY STOMACH 06/23/19   [name], NP  losartan (COZAAR) 25 MG tablet TAKE 1 TABLET BY MOUTH DAILY 06/23/19   [name], NP  pantoprazole (PROTONIX) 40 MG tablet TAKE 1 TABLET(40 MG) BY MOUTH TWICE DAILY 03/01/19   [name], NP  potassium gluconate 595 (99 K) MG TABS tablet Take 595 mg by mouth See admin instructions. TAKES ONLY WHEN USING FUROSEMIDE    [provider]  predniSONE (DELTASONE) 10 MG tablet Take 4 tabs po daily x 2  days; then 3 tabs for 2 days; then 2 tabs for 2 days; then 1 tab for 2 days 07/07/19   Martyn Ehrich, NP  rosuvastatin (CRESTOR) 10 MG tablet Take 1 tablet (10 mg total) by mouth daily. 09/02/18   [name], NP  triamcinolone (KENALOG) 0.025 % cream APPLY  CREAM TOPICALLY ONCE DAILY AS NEEDED FOR PSORIASIS 04/15/18   [name], NP    Family History Family History  Problem Relation Age of Onset  .  Diabetes Father   . Stroke Father   . Heart disease Father   . Liver cancer Sister   . Heart disease Brother   . Asthma Sister   . Arthritis Sister   . Breast cancer Neg Hx     Social History Social History   Tobacco Use  . Smoking status: Former Smoker    Packs/day: 1.00    Years: 40.00    Pack years: 40.00    Quit date: 09/07/1989    Years since quitting: 29.8  . Smokeless tobacco: Never Used  Substance Use Topics  . Alcohol use: No    Alcohol/week: 0.0 standard drinks  . Drug use: No     Allergies   Codeine and Vesicare [solifenacin]   Review of Systems Review of Systems  Constitutional: Negative for fever.  HENT: Negative for rhinorrhea and sore throat.   Eyes: Negative for redness.  Respiratory: Positive for shortness of breath and wheezing. Negative for cough.   Cardiovascular: Negative for chest pain and leg swelling.  Gastrointestinal: Negative for abdominal pain, diarrhea, nausea and vomiting.  Genitourinary: Negative for dysuria.  Musculoskeletal: Negative for myalgias.  Skin: Negative for rash.  Neurological: Negative for headaches.     Physical Exam Updated Vital Signs BP 127/80 (BP Location: Right Arm)   Pulse (!) 107   Temp 98.5 F (36.9 C) (Oral)   Resp (!) 26   Ht _0  (1.6 m)   Wt 101.1 kg   SpO2 96%   BMI 39.47 kg/m   Physical Exam Vitals signs and nursing note reviewed.  Constitutional:      Appearance: She is well-developed.  HENT:     Head: Normocephalic and atraumatic.  Eyes:     General:        Right eye:  No discharge.        Left eye: No discharge.     Conjunctiva/sclera: Conjunctivae normal.  Neck:     Musculoskeletal: Normal range of motion and neck supple.  Cardiovascular:     Rate and Rhythm: Tachycardia present. Rhythm irregular.     Heart sounds: Murmur present.  Pulmonary:     Effort: Pulmonary effort is normal.     Breath sounds: Normal breath sounds.  Abdominal:     Palpations: Abdomen is soft.     Tenderness: There is no abdominal tenderness. There is no guarding or rebound.  Skin:    General: Skin is warm and dry.  Neurological:     Mental Status: She is alert.      ED Treatments / Results  Labs (all labs ordered are listed, but only abnormal results are displayed) Labs Reviewed  CBC WITH DIFFERENTIAL/PLATELET - Abnormal; Notable for the following components:      Result Value   RBC 3.84 (*)    Hemoglobin 10.9 (*)    MCHC 29.9 (*)    RDW 16.6 (*)    Lymphs Abs 0.6 (*)    All other components within normal limits  BASIC METABOLIC PANEL - Abnormal; Notable for the following components:   Glucose, Bld 171 (*)    BUN 62 (*)    Creatinine, Ser 2.59 (*)    GFR calc non Af Amer 17 (*)    GFR calc Af Amer 19 (*)    Anion gap 16 (*)    All other components within normal limits  BRAIN NATRIURETIC PEPTIDE - Abnormal; Notable for the following components:   B Natriuretic Peptide 943.8 (*)    All other  components within normal limits  SARS CORONAVIRUS 2 (HOSPITAL ORDER, [place] LAB)  TROPONIN I (HIGH SENSITIVITY)  TROPONIN I (HIGH SENSITIVITY)    ED ECG REPORT   Date: 07/25/2019  Rate: 108  Rhythm: atrial fibrillation  QRS Axis: left  Intervals: normal  ST/T Wave abnormalities: normal  Conduction Disutrbances:left bundle branch block  Narrative Interpretation:   Old EKG Reviewed: unchanged from earlier today  I have personally reviewed the EKG tracing and agree with the computerized printout as noted.  Radiology Dg Chest  Portable 1 View  Result Date: 07/25/2019 CLINICAL DATA:  Chest pain and shortness of breath EXAM: PORTABLE CHEST 1 VIEW COMPARISON:  July 08, 2019 FINDINGS: Stable cardiomegaly. No pneumothorax. No nodules or masses. The left lung is clear. There is a small effusion on the right with mild opacity in the right base, more prominent the interval. No other changes. IMPRESSION: 1. There is a small right pleural effusion. Right basilar opacity is more prominent the interval and could represent developing infiltrate/pneumonia versus atelectasis. Electronically Signed   By: Dorise Bullion III M.D   On: 07/25/2019 18:36    Procedures Procedures (including critical care time)  Medications Ordered in ED Medications  albuterol (VENTOLIN HFA) 108 (90 Base) MCG/ACT inhaler 6 puff (has no administration in time range)  ipratropium (ATROVENT HFA) inhaler 2 puff (2 puffs Inhalation Given 07/25/19 1845)  furosemide (LASIX) injection 40 mg (40 mg Intravenous Given 07/25/19 1922)  methylPREDNISolone sodium succinate (SOLU-MEDROL) 125 mg/2 mL injection 125 mg (125 mg Intravenous Given 07/25/19 1922)     Initial Impression / Assessment and Plan / ED Course  I have reviewed the triage vital signs and the nursing notes.  Pertinent labs & imaging results that were available during my care of the patient were reviewed by me and considered in my medical decision making (see chart for details).  Clinical Course as of Jul 24 1949  Mon Jul 25, 2019  1907 EKGAtrial fibrillation with a rate of 108Left bundle branch blockBundle branch block was noted on prior EKG   [JK]    Clinical Course User Index [JK] Dorie Rank, MD       Patient seen and examined. Work-up initiated. Medications ordered. Will likely need admission. Awaiting CXR.   Vital signs reviewed and are as follows: BP 127/80 (BP Location: Right Arm)   Pulse (!) 107   Temp 98.5 F (36.9 C) (Oral)   Resp (!) 26   Ht _0  (1.6 m)   Wt 101.1 kg    SpO2 96%   BMI 39.47 kg/m   7:54 PM Patient discussed with and seen by Dr. Tomi Bamberger. Will admit for SOB, hypoxia, likely element of decompensated CHF with COPD contributing.  Patient currently in A. fib however this appears to be paroxysmal and rate is relatively controlled 100-110.  Given hypoxia at rest, failure of outpatient treatment, will request admission for continued treatment.  7:58 PM Spoke with Dr. Hal Hope who will see, admit.   Final Clinical Impressions(s) / ED Diagnoses   Final diagnoses:  Acute on chronic diastolic congestive heart failure (HCC)  SOB (shortness of breath)  Chronic kidney disease, unspecified CKD stage  Acute respiratory failure with hypoxia (HCC)  Paroxysmal atrial fibrillation ([place])   Admit.   ED Discharge Orders    None       Carlisle Cater, Hershal Coria 07/25/19 Allayne Stack, MD 07/25/19 [phone]

## 2019-07-25 NOTE — H&P (Signed)
History and Physical    Patricia [name] DOB: 1936-01-01 DOA: 07/25/2019  PCP: Patricia Chandler, NP  Patient coming from: Home.  Chief Complaint: Shortness of breath.  HPI: Patricia [name] is a 83 y.o. female with history of diastolic CHF last EF measured in December 2018 was 64 to 08% with diastolic dysfunction, status post TAVR, COPD, A. fib, anemia, chronic kidney disease stage III, diabetes mellitus presents to the ER because of worsening shortness of breath.  Patient states she has been short of breath for last few weeks which has been progressively worsening.  About 2 weeks ago patient was placed on steroids by her pulmonologist for bronchitis.  Patient denies any fever chills productive cough or has not noticed any weight gain.  Shortness of breath is present even at rest and increased on exertion.  ED Course: In the ER chest x-ray shows possible infiltrates.  EKG shows A. fib rate controlled with LBBB.  On exam patient has mild wheezing bilaterally.  Was given IV Solu-Medrol and since patient also has features concerning for CHF was given Lasix 40 mg IV and admitted for acute respiratory failure hypoxia.  COVID-19 test was negative.  Labs show worsening renal function over the last 1 month.  Creatinine was 2.5 hemoglobin 10.9 BNP 943 high-sensitivity troponin was 29 and 30.  Review of Systems: As per HPI, rest all negative.   Past Medical History:  Diagnosis Date  . Allergy   . Anemia, unspecified   . Arthritis   . Asthma   . Atrial fibrillation status post cardioversion [facility]) 09/2015   s/p TEE/DCCV>>SR on amio  . Benign neoplasm of colon   . Carpal tunnel syndrome   . Cellulitis and abscess of finger, unspecified   . Cerumen impaction   . Cervicalgia   . CHF (congestive heart failure) ([place])   . Chronic airway obstruction, not elsewhere classified   . Chronic anticoagulation 09/2015   Eliquis  . Diarrhea   . Diffuse cystic mastopathy   . Edema   .  Encounter for long-term (current) use of other medications   . GERD (gastroesophageal reflux disease)   . Heart murmur   . Lumbago   . Neck pain 05/23/2015  . Obesity, unspecified   . Other and unspecified hyperlipidemia   . Other psoriasis   . Pain in joint, lower leg   . PONV (postoperative nausea and vomiting)   . Primary localized osteoarthrosis of right shoulder 12/18/2015  . 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 Scoliosis (and kyphoscoliosis), idiopathic   . Shortness of breath dyspnea    with exertion  . Type II or unspecified type diabetes mellitus without mention of complication, uncontrolled   . Unspecified essential hypertension   . Unspecified hemorrhoids without mention of complication   . Unspecified hypothyroidism   . Urge incontinence     Past Surgical History:  Procedure Laterality Date  . ABDOMINAL HYSTERECTOMY  1987   complete  . BREAST BIOPSY Right   . BREAST EXCISIONAL BIOPSY Left 2011  . BREAST EXCISIONAL BIOPSY Right    x2  . BREAST SURGERY     right breast 3 surgeries [phone]  . CARDIAC CATHETERIZATION N/A 09/02/2016   Procedure: Right/Left Heart Cath and Coronary Angiography;  Surgeon: [name] M Martinique, MD;  Location: [place] CV LAB;  Service: Cardiovascular;  Laterality: N/A;  . CARDIOVERSION N/A  10/19/2015   Procedure: CARDIOVERSION;  Surgeon: Lelon Perla, MD;  Location: [facility] ENDOSCOPY;  Service: Cardiovascular;  Laterality: N/A;  . CARDIOVERSION N/A 11/13/2017   Procedure: CARDIOVERSION;  Surgeon: Jerline Pain, MD;  Location: [place];  Service: Cardiovascular;  Laterality: N/A;  . [place]  . COLON SURGERY     2004,2007,2013.  3 times colon surgieres  . ESOPHAGOGASTRODUODENOSCOPY (EGD) WITH PROPOFOL Left 03/13/2017   Procedure: ESOPHAGOGASTRODUODENOSCOPY (EGD) WITH PROPOFOL;   Surgeon: Ronnette Juniper, MD;  Location: [place];  Service: Gastroenterology;  Laterality: Left;  . ESOPHAGOGASTRODUODENOSCOPY (EGD) WITH PROPOFOL Left 01/22/2018   Procedure: ESOPHAGOGASTRODUODENOSCOPY (EGD) WITH PROPOFOL;  Surgeon: Arta Silence, MD;  Location: [facility] ENDOSCOPY;  Service: Endoscopy;  Laterality: Left;  . EXCISE LE MANDIBULAR LYMPH NODE T     DR C. NEWMAN  . FOOT SURGERY  1989  . GIVENS CAPSULE STUDY Left 01/23/2018   Procedure: GIVENS CAPSULE STUDY;  Surgeon: Wilford Corner, MD;  Location: [facility] ENDOSCOPY;  Service: Endoscopy;  Laterality: Left;  . LEFT SHOULDER ARTHROSCOPY    . 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 NEUROPLASTY / TRANSPOSITION MEDIAN NERVE AT CARPAL TUNNEL BILATERAL  05/2003  . TEE WITHOUT CARDIOVERSION N/A 10/19/2015   Procedure: Transesophageal Echocardiogram (TEE) ;  Surgeon: Lelon Perla, MD;  Location: [facility] ENDOSCOPY;  Service: Cardiovascular;  Laterality: N/A;  . TEE WITHOUT CARDIOVERSION N/A 10/14/2016   Procedure: TRANSESOPHAGEAL ECHOCARDIOGRAM (TEE);  Surgeon: Sherren Mocha, MD;  Location: [place];  Service: Open Heart Surgery;  Laterality: N/A;  . TOTAL SHOULDER ARTHROPLASTY Right 12/18/2015   Procedure: TOTAL SHOULDER ARTHROPLASTY;  Surgeon: [name], MD;  Location: [place];  Service: Orthopedics;  Laterality: Right;  . TRANSCATHETER AORTIC VALVE REPLACEMENT, TRANSFEMORAL N/A 10/14/2016   Procedure: TRANSCATHETER AORTIC VALVE REPLACEMENT, TRANSFEMORAL;  Surgeon: Sherren Mocha, MD;  Location: [place];  Service: Open Heart Surgery;  Laterality: N/A;  . TRIGGER FINGER RELEASE Left   . VAGINAL CYST REMOVED  1967     reports that she quit smoking about 29 years ago. She has a 40.00 pack-year smoking history. She has never used smokeless tobacco. She reports that she does not drink alcohol or use drugs.  Allergies  Allergen Reactions  . Codeine Other (See Comments)    "Spaces out" the patient and she cannot walk well  . Vesicare [Solifenacin] Itching     Family History  Problem Relation Age of Onset  . Diabetes Father   . Stroke Father   . Heart disease Father   . Liver cancer Sister   . Heart disease Brother   . Asthma Sister   . Arthritis Sister   . Breast cancer Neg Hx     Prior to Admission medications   Medication Sig Start Date End Date Taking? Authorizing Provider  acetaminophen (TYLENOL) 500 MG tablet Take 500 mg by mouth every 6 (six) hours as needed for mild pain.    [provider]  albuterol (PROVENTIL) (2.5 MG/3ML) 0.083% nebulizer solution USE 1 VIAL IN NEBULIZER EVERY 4 HOURS AND AS NEEDED. Generic: VENTOLIN 03/17/19   Fenton Foy, NP  albuterol (VENTOLIN HFA) 108 (90 Base) MCG/ACT inhaler Inhale 2 puffs into the lungs every 6 (six) hours as needed for wheezing or shortness of breath. 11/19/17   Brand Males, MD  amiodarone (PACERONE) 200 MG tablet TAKE 1 TABLET BY MOUTH TWICE DAILY FOR 30 DAYS. THEN REDUCE TO 1 TABLET BY MOUTH EVERY DAY THEREAFTER 06/27/19   Martinique, [name] M,  MD  Blood Glucose Monitoring Suppl (ONE TOUCH ULTRA 2) w/Device KIT Use as instructed to test blood sugar twice daily DX: E11.9 10/04/18   Reed, Tiffany L, DO  BREO ELLIPTA 200-25 MCG/INH AEPB INHALE 1 PUFF INTO THE LUNGS DAILY 04/08/19   Martyn Ehrich, NP  cetirizine (ZYRTEC) 10 MG tablet Take 10 mg by mouth daily as needed for allergies.     [provider]  cholecalciferol (VITAMIN D) 1000 units tablet Take 1,000 Units by mouth at bedtime.     [provider]  colestipol (COLESTID) 1 g tablet Take 1 tablet by mouth 2 (two) times daily. 04/19/18   [provider]  doxazosin (CARDURA) 4 MG tablet TAKE 1 TABLET BY MOUTH ONCE DAILY TO HELP CONTROL BLOOD PRESSURE 03/15/19   Patricia Chandler, NP  ferrous sulfate 325 (65 FE) MG EC tablet Take 325 mg by mouth 2 (two) times daily.    [provider]  furosemide (LASIX) 40 MG tablet APPOINTMENT OVERDUE, take 2 by mouth daily 05/23/19   Patricia Chandler,  NP  glimepiride (AMARYL) 2 MG tablet TAKE 1 TABLET BY MOUTH ONCE DAILY AT BREAKFAST TO CONTROL GLUCOSE 03/03/19   Patricia Chandler, NP  glucose blood (ONE TOUCH ULTRA TEST) test strip Use as instructed to test blood sugar twice daily DX: E11.9 10/04/18   Reed, Tiffany L, DO  JANUVIA 50 MG tablet TAKE 1 TABLET(50 MG) BY MOUTH DAILY 04/15/19   Patricia Chandler, NP  Lancets [facility] ULTRASOFT) lancets Use as instructed to test blood sugar twice daily DX: E11.9 10/04/18   Reed, Tiffany L, DO  levothyroxine (SYNTHROID) 88 MCG tablet TAKE 1 TABLET BY MOUTH DAILY ON AN EMPTY STOMACH 06/23/19   Patricia Chandler, NP  losartan (COZAAR) 25 MG tablet TAKE 1 TABLET BY MOUTH DAILY 06/23/19   Patricia Chandler, NP  pantoprazole (PROTONIX) 40 MG tablet TAKE 1 TABLET(40 MG) BY MOUTH TWICE DAILY 03/01/19   Patricia Chandler, NP  potassium gluconate 595 (99 K) MG TABS tablet Take 595 mg by mouth See admin instructions. TAKES ONLY WHEN USING FUROSEMIDE    [provider]  predniSONE (DELTASONE) 10 MG tablet Take 4 tabs po daily x 2 days; then 3 tabs for 2 days; then 2 tabs for 2 days; then 1 tab for 2 days 07/07/19   Martyn Ehrich, NP  rosuvastatin (CRESTOR) 10 MG tablet Take 1 tablet (10 mg total) by mouth daily. 09/02/18   Patricia Chandler, NP  triamcinolone (KENALOG) 0.025 % cream APPLY  CREAM TOPICALLY ONCE DAILY AS NEEDED FOR PSORIASIS 04/15/18   Patricia Chandler, NP    Physical Exam: Constitutional: Moderately built and nourished. Vitals:   07/25/19 1750 07/25/19 1751  BP: 127/80   Pulse: (!) 107   Resp: (!) 26   Temp: 98.5 F (36.9 C)   TempSrc: Oral   SpO2: 96%   Weight:  101.1 kg  Height:  _0  (1.6 m)   Eyes: Anicteric no pallor. ENMT: No discharge from the ears eyes nose or mouth. Neck: No mass felt.  No neck rigidity. Respiratory: Mild expiratory wheeze no crepitations. Cardiovascular: S1-S2 heard. Abdomen: Soft nontender bowel sounds present. Musculoskeletal: No edema.  Skin: No rash. Neurologic: Alert awake oriented to time place and person.  Moves all extremities. Psychiatric: Appears normal per normal affect.   Labs on Admission: I have personally reviewed following labs and imaging studies  CBC: Recent Labs  Lab 07/25/19 1805  WBC 6.2  NEUTROABS 5.0  HGB 10.9*  HCT 36.4  MCV 94.8  PLT 967   Basic Metabolic Panel: Recent Labs  Lab 07/25/19 1805  NA 137  K 4.3  CL 98  CO2 23  GLUCOSE 171*  BUN 62*  CREATININE 2.59*  CALCIUM 9.0   GFR: Estimated Creatinine Clearance: 19.7 mL/min (A) (by C-G formula based on SCr of 2.59 mg/dL (H)). Liver Function Tests: No results for input(s): AST, ALT, ALKPHOS, BILITOT, PROT, ALBUMIN in the last 168 hours. No results for input(s): LIPASE, AMYLASE in the last 168 hours. No results for input(s): AMMONIA in the last 168 hours. Coagulation Profile: No results for input(s): INR, PROTIME in the last 168 hours. Cardiac Enzymes: No results for input(s): CKTOTAL, CKMB, CKMBINDEX, TROPONINI in the last 168 hours. BNP (last 3 results) Recent Labs    08/12/18 1007 07/08/19 1139 07/15/19 1222  PROBNP 312.0* 1,184.0* 835.0*   HbA1C: No results for input(s): HGBA1C in the last 72 hours. CBG: No results for input(s): GLUCAP in the last 168 hours. Lipid Profile: No results for input(s): CHOL, HDL, LDLCALC, TRIG, CHOLHDL, LDLDIRECT in the last 72 hours. Thyroid Function Tests: No results for input(s): TSH, T4TOTAL, FREET4, T3FREE, THYROIDAB in the last 72 hours. Anemia Panel: No results for input(s): VITAMINB12, FOLATE, FERRITIN, TIBC, IRON, RETICCTPCT in the last 72 hours. Urine analysis:    Component Value Date/Time   COLORURINE YELLOW 10/10/2016 1409   APPEARANCEUR HAZY (A) 10/10/2016 1409   LABSPEC 1.020 10/10/2016 1409   PHURINE 5.0 10/10/2016 1409   GLUCOSEU NEGATIVE 10/10/2016 1409   HGBUR NEGATIVE 10/10/2016 1409   BILIRUBINUR NEGATIVE 10/10/2016 1409   BILIRUBINUR negative 07/18/2015  1337   BILIRUBINUR small 10/07/2012 1544   KETONESUR NEGATIVE 10/10/2016 1409   PROTEINUR 30 (A) 10/10/2016 1409   UROBILINOGEN 0.2 07/18/2015 1337   NITRITE NEGATIVE 10/10/2016 1409   LEUKOCYTESUR NEGATIVE 10/10/2016 1409   Sepsis Labs: _0 (procalcitonin:4,lacticidven:4) ) Recent Results (from the past 240 hour(s))  SARS Coronavirus 2 [facility] order, Performed in [facility] hospital lab) Nasopharyngeal Nasopharyngeal Swab     Status: None   Collection Time: 07/25/19  6:24 PM   Specimen: Nasopharyngeal Swab  Result Value Ref Range Status   SARS Coronavirus 2 NEGATIVE NEGATIVE Final    Comment: (NOTE) If result is NEGATIVE SARS-CoV-2 target nucleic acids are NOT DETECTED. The SARS-CoV-2 RNA is generally detectable in upper and lower  respiratory specimens during the acute phase of infection. The lowest  concentration of SARS-CoV-2 viral copies this assay can detect is 250  copies / mL. A negative result does not preclude SARS-CoV-2 infection  and should not be used as the sole basis for treatment or other  patient management decisions.  A negative result may occur with  improper specimen collection / handling, submission of specimen other  than nasopharyngeal swab, presence of viral mutation(s) within the  areas targeted by this assay, and inadequate number of viral copies  (<250 copies / mL). A negative result must be combined with clinical  observations, patient history, and epidemiological information. If result is POSITIVE SARS-CoV-2 target nucleic acids are DETECTED. The SARS-CoV-2 RNA is generally detectable in upper and lower  respiratory specimens dur ing the acute phase of infection.  Positive  results are indicative of active infection with SARS-CoV-2.  Clinical  correlation with patient history and other diagnostic information is  necessary to determine patient infection status.  Positive results do  not rule out bacterial infection or co-infection with  other  viruses. If result is PRESUMPTIVE POSTIVE SARS-CoV-2 nucleic acids MAY BE PRESENT.   A presumptive positive result was obtained on the submitted specimen  and confirmed on repeat testing.  While 2019 novel coronavirus  (SARS-CoV-2) nucleic acids may be present in the submitted sample  additional confirmatory testing may be necessary for epidemiological  and / or clinical management purposes  to differentiate between  SARS-CoV-2 and other Sarbecovirus currently known to infect humans.  If clinically indicated additional testing with an alternate test  methodology [phone]) is advised. The SARS-CoV-2 RNA is generally  detectable in upper and lower respiratory sp ecimens during the acute  phase of infection. The expected result is Negative. Fact Sheet for Patients:  StrictlyIdeas.no Fact Sheet for Healthcare Providers: BankingDealers.co.za This test is not yet approved or cleared by the [place] FDA and has been authorized for detection and/or diagnosis of SARS-CoV-2 by FDA under an Emergency Use Authorization (EUA).  This EUA will remain in effect (meaning this test can be used) for the duration of the COVID-19 declaration under Section 564(b)(1) of the Act, 21 U.S.C. section 360bbb-3(b)(1), unless the authorization is terminated or revoked sooner. Performed at [facility], [place] [address] [place], [place] [address]      Radiological Exams on Admission: Dg Chest Portable 1 View  Result Date: 07/25/2019 CLINICAL DATA:  Chest pain and shortness of breath EXAM: PORTABLE CHEST 1 VIEW COMPARISON:  July 08, 2019 FINDINGS: Stable cardiomegaly. No pneumothorax. No nodules or masses. The left lung is clear. There is a small effusion on the right with mild opacity in the right base, more prominent the interval. No other changes. IMPRESSION: 1. There is a small right pleural effusion. Right basilar opacity is more prominent the  interval and could represent developing infiltrate/pneumonia versus atelectasis. Electronically Signed   By: Dorise Bullion III M.D   On: 07/25/2019 18:36    EKG: Independently reviewed.  A. fib with LVH.  Assessment/Plan Principal Problem:   Acute on chronic diastolic (congestive) heart failure (HCC) Active Problems:   Essential hypertension   DM type 2 (diabetes mellitus, type 2) (HCC)   Hypothyroidism   COPD (chronic obstructive pulmonary disease) (HCC)   PAF (paroxysmal atrial fibrillation) (HCC)   S/P TAVR (transcatheter aortic valve replacement)   Acute diastolic CHF (congestive heart failure) ([place])    1. Acute on chronic respiratory failure with hypoxia likely from COPD and CHF with last EF measured in 2018 was 60 to 63% with diastolic dysfunction with known history of status post TAVR -we will keep patient on Lasix 40 mg IV nebulizer treatment IV steroid and since chest x-ray shows possible infiltrate we will keep in doxycycline and check procalcitonin.  Follow intake output metabolic panel and daily weight.  Will check VQ scan and consult cardiology. 2. Atrial fibrillation on amiodarone -not sure if infiltrates are from amiodarone.  Check procalcitonin.  Not on anticoagulation secondary GI bleed. 3. Chronic kidney disease stage III with worsening renal function over the last 1 month.  Check urinalysis.  Will hold Cozaar.  Closely follow intake output. 4. Hypothyroidism on Synthroid. 5. Diabetes mellitus on sliding scale coverage.  Note that patient is on steroids. 6. Anemia likely from renal disease follow CBC.  Has history of GI bleed. 7. Hyperlipidemia on statins.   DVT prophylaxis: Lovenox. Code Status: Full code. Family Communication: Discussed with patient. Disposition Plan: Home. Consults called: Cardiology. Admission status: Observation.   Rise Patience MD [facility] Pager  [place] [phone].  If 7PM-7AM, please contact night-coverage [email or web address]  Password [facility]  07/25/2019, 9:42 PM

## 2019-07-25 NOTE — ED Triage Notes (Signed)
Pt was at PCP for follow up appointment for COPD exacerbation. Pt finished taking prescribed prednisone but was still feeling SOB. PCP called EMS for more in depth work up. Pt does not wear O2 at home. EMS reported pulse ox in the 90s so started 2 L/min nasal cannula for comfort. Pt's pulse ox 96% on RA on arrival. A/o x4. No pain reported.

## 2019-07-25 NOTE — Progress Notes (Signed)
Careteam: Patient Care Team: [name], NP as PCP - General (Geriatric Medicine) Martinique, [name] M, MD as PCP - Cardiology (Cardiology) [name], MD as Consulting Physician (Orthopedic Surgery) Druscilla Brownie, MD as Consulting Physician (Dermatology) Ralene Bathe, MD as Consulting Physician (Ophthalmology)  Advanced Directive information Does Patient Have a Medical Advance Directive?: Yes, Type of Advance Directive: [place], Does patient want to make changes to medical advance directive?: No - Patient declined  Allergies  Allergen Reactions  . Codeine Other (See Comments)    "Spaces out" the patient and she cannot walk well  . Vesicare [Solifenacin] Itching    Chief Complaint  Patient presents with  . Medical Management of Chronic Issues    7 month follow-up  . Immunizations    Discuss if ok to get flu vaccine today, patient has several concerns to discuss      HPI: Patient is a 83 y.o. female seen in the office today for routine follow up.  Has been very dizzy and having a hard time breathing  Vertigo- had issues with this a few years ago and it was inner ear. She had to go to the ENT and they had to work on it before it got better. Dr Lucia Gaskins was who she saw before but did not prefer him.  When she has an episode it persist - can last hours.  No fevers or chills.  Increase fatigue.  No headaches, tinnitus. Ears hurts.  Vertigo has been ongoing for months.  shortess of breath increase in WOB- pulmonary will call in prednisone but only works for a short time. At her last pulmonary appt her BNP was elevated therefore she was increased to lasix 40 mg 3 tablets daily for 5 days which also helped breathing. BNP was noted to be elevated. She called cardiologist but could not get in any sooer No chest pains, has tightness when she is breathing hard but not pain.  Weight is stable.  She denies palpitations.  Using proair frequently- a  few times a day. This helps as well as nebulizer  Using breo daily as prescribed. Using mucinex as needed   CKD- has not seen a nephrologist in the past.   Anemia- dark stools due to iron supplement. No blood loss that she is aware of.   Hypothyroid-   Decrease appetite, food looks nauseating. Ate ice cream one today- that's it. Not having nausea currently but frequently bothered by it.  She is diabetic. Reports blood sugars have been between 100-150 she just takes blood sugar not really at a particular time. Taking [place] and glimepiride- reports some episodes that sound like they could be hypoglycemic episode.     Review of Systems:  Review of Systems  Constitutional: Positive for malaise/fatigue. Negative for chills, fever and weight loss.  HENT: Negative for congestion and tinnitus.   Respiratory: Positive for shortness of breath (hx of asthma, chronic, worsening shortness of breath at this time). Negative for cough and sputum production.        Overall shortness of breath has been stable and unchanged  Cardiovascular: Negative for chest pain, palpitations and leg swelling.       Chest tightness  Gastrointestinal: Negative for abdominal pain, constipation, diarrhea and heartburn.  Genitourinary: Negative for dysuria, frequency and urgency.  Skin: Negative.   Neurological: Positive for dizziness. Negative for weakness and headaches.  Endo/Heme/Allergies: Positive for environmental allergies.  Psychiatric/Behavioral: Negative for depression and memory loss. The patient does not  have insomnia.     Past Medical History:  Diagnosis Date  . Allergy   . Anemia, unspecified   . Arthritis   . Asthma   . Atrial fibrillation status post cardioversion [facility]) 09/2015   s/p TEE/DCCV>>SR on amio  . Benign neoplasm of colon   . Carpal tunnel syndrome   . Cellulitis and abscess of finger, unspecified   . Cerumen impaction   . Cervicalgia   . CHF (congestive heart failure) ([place])   .  Chronic airway obstruction, not elsewhere classified   . Chronic anticoagulation 09/2015   Eliquis  . Diarrhea   . Diffuse cystic mastopathy   . Edema   . Encounter for long-term (current) use of other medications   . GERD (gastroesophageal reflux disease)   . Heart murmur   . Lumbago   . Neck pain 05/23/2015  . Obesity, unspecified   . Other and unspecified hyperlipidemia   . Other psoriasis   . Pain in joint, lower leg   . PONV (postoperative nausea and vomiting)   . Primary localized osteoarthrosis of right shoulder 12/18/2015  . 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 Scoliosis (and kyphoscoliosis), idiopathic   . Shortness of breath dyspnea    with exertion  . Type II or unspecified type diabetes mellitus without mention of complication, uncontrolled   . Unspecified essential hypertension   . Unspecified hemorrhoids without mention of complication   . Unspecified hypothyroidism   . Urge incontinence    Past Surgical History:  Procedure Laterality Date  . ABDOMINAL HYSTERECTOMY  1987   complete  . BREAST BIOPSY Right   . BREAST EXCISIONAL BIOPSY Left 2011  . BREAST EXCISIONAL BIOPSY Right    x2  . BREAST SURGERY     right breast 3 surgeries [phone]  . CARDIAC CATHETERIZATION N/A 09/02/2016   Procedure: Right/Left Heart Cath and Coronary Angiography;  Surgeon: [name] M Martinique, MD;  Location: [place] CV LAB;  Service: Cardiovascular;  Laterality: N/A;  . CARDIOVERSION N/A 10/19/2015   Procedure: CARDIOVERSION;  Surgeon: Lelon Perla, MD;  Location: [facility] ENDOSCOPY;  Service: Cardiovascular;  Laterality: N/A;  . CARDIOVERSION N/A 11/13/2017   Procedure: CARDIOVERSION;  Surgeon: Jerline Pain, MD;  Location: [place];  Service: Cardiovascular;  Laterality: N/A;  . [place]  . COLON SURGERY     2004,2007,2013.   3 times colon surgieres  . ESOPHAGOGASTRODUODENOSCOPY (EGD) WITH PROPOFOL Left 03/13/2017   Procedure: ESOPHAGOGASTRODUODENOSCOPY (EGD) WITH PROPOFOL;  Surgeon: Ronnette Juniper, MD;  Location: [place];  Service: Gastroenterology;  Laterality: Left;  . ESOPHAGOGASTRODUODENOSCOPY (EGD) WITH PROPOFOL Left 01/22/2018   Procedure: ESOPHAGOGASTRODUODENOSCOPY (EGD) WITH PROPOFOL;  Surgeon: Arta Silence, MD;  Location: [facility] END[facility]COPY;  Service: Endoscopy;  Laterality: Left;  . EXCISE LE MANDIBULAR LYMPH NODE T     DR C. NEWMAN  . FOOT SURGERY  1989  . GIVENS CAPSULE STUDY Left 01/23/2018   Procedure: GIVENS CAPSULE STUDY;  Surgeon: Wilford Corner, MD;  Location: [facility] ENDOSCOPY;  Service: Endoscopy;  Laterality: Left;  . LEFT SHOULDER ARTHROSCOPY    . 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 NEUROPLASTY / TRANSPOSITION MEDIAN NERVE AT CARPAL TUNNEL BILATERAL  05/2003  . TEE WITHOUT CARDIOVERSION N/A 10/19/2015   Procedure: Transesophageal Echocardiogram (TEE) ;  Surgeon: Lelon Perla, MD;  Location: [place];  Service: Cardiovascular;  Laterality: N/A;  .  TEE WITHOUT CARDIOVERSION N/A 10/14/2016   Procedure: TRANSESOPHAGEAL ECHOCARDIOGRAM (TEE);  Surgeon: Sherren Mocha, MD;  Location: [place];  Service: Open Heart Surgery;  Laterality: N/A;  . TOTAL SHOULDER ARTHROPLASTY Right 12/18/2015   Procedure: TOTAL SHOULDER ARTHROPLASTY;  Surgeon: [name], MD;  Location: [place];  Service: Orthopedics;  Laterality: Right;  . TRANSCATHETER AORTIC VALVE REPLACEMENT, TRANSFEMORAL N/A 10/14/2016   Procedure: TRANSCATHETER AORTIC VALVE REPLACEMENT, TRANSFEMORAL;  Surgeon: Sherren Mocha, MD;  Location: [place];  Service: Open Heart Surgery;  Laterality: N/A;  . TRIGGER FINGER RELEASE Left   . VAGINAL CYST REMOVED  1967   Social History:   reports that she quit smoking about 29 years ago. She has a 40.00 pack-year smoking history. She has never used smokeless tobacco. She reports that she does not drink alcohol or  use drugs.  Family History  Problem Relation Age of Onset  . Diabetes Father   . Stroke Father   . Heart disease Father   . Liver cancer Sister   . Heart disease Brother   . Asthma Sister   . Arthritis Sister   . Breast cancer Neg Hx     Medications: Patient's Medications  New Prescriptions   No medications on file  Previous Medications   ACETAMINOPHEN (TYLENOL) 500 MG TABLET    Take 500 mg by mouth every 6 (six) hours as needed for mild pain.   ALBUTEROL (PROVENTIL) (2.5 MG/3ML) 0.083% NEBULIZER SOLUTION    USE 1 VIAL IN NEBULIZER EVERY 4 HOURS AND AS NEEDED. Generic: VENTOLIN   ALBUTEROL (VENTOLIN HFA) 108 (90 BASE) MCG/ACT INHALER    Inhale 2 puffs into the lungs every 6 (six) hours as needed for wheezing or shortness of breath.   AMIODARONE (PACERONE) 200 MG TABLET    TAKE 1 TABLET BY MOUTH TWICE DAILY FOR 30 DAYS. THEN REDUCE TO 1 TABLET BY MOUTH EVERY DAY THEREAFTER   BLOOD GLUCOSE MONITORING SUPPL (ONE TOUCH ULTRA 2) W/DEVICE KIT    Use as instructed to test blood sugar twice daily DX: E11.9   BREO ELLIPTA 200-25 MCG/INH AEPB    INHALE 1 PUFF INTO THE LUNGS DAILY   CETIRIZINE (ZYRTEC) 10 MG TABLET    Take 10 mg by mouth daily as needed for allergies.    CHOLECALCIFEROL (VITAMIN D) 1000 UNITS TABLET    Take 1,000 Units by mouth at bedtime.    COLESTIPOL (COLESTID) 1 G TABLET    Take 1 tablet by mouth 2 (two) times daily.   DOXAZOSIN (CARDURA) 4 MG TABLET    TAKE 1 TABLET BY MOUTH ONCE DAILY TO HELP CONTROL BLOOD PRESSURE   FERROUS SULFATE 325 (65 FE) MG EC TABLET    Take 325 mg by mouth 2 (two) times daily.   FUROSEMIDE (LASIX) 40 MG TABLET    APPOINTMENT OVERDUE, take 2 by mouth daily   GLIMEPIRIDE (AMARYL) 2 MG TABLET    TAKE 1 TABLET BY MOUTH ONCE DAILY AT BREAKFAST TO CONTROL GLUCOSE   GLUCOSE BLOOD (ONE TOUCH ULTRA TEST) TEST STRIP    Use as instructed to test blood sugar twice daily DX: E11.9   JANUVIA 50 MG TABLET    TAKE 1 TABLET(50 MG) BY MOUTH DAILY   LANCETS  (ONETOUCH ULTRASOFT) LANCETS    Use as instructed to test blood sugar twice daily DX: E11.9   LEVOTHYROXINE (SYNTHROID) 88 MCG TABLET    TAKE 1 TABLET BY MOUTH DAILY ON AN EMPTY STOMACH   LOSARTAN (COZAAR) 25 MG TABLET  TAKE 1 TABLET BY MOUTH DAILY   PANTOPRAZOLE (PROTONIX) 40 MG TABLET    TAKE 1 TABLET(40 MG) BY MOUTH TWICE DAILY   POTASSIUM GLUCONATE 595 (99 K) MG TABS TABLET    Take 595 mg by mouth See admin instructions. TAKES ONLY WHEN USING FUROSEMIDE   PREDNISONE (DELTASONE) 10 MG TABLET    Take 4 tabs po daily x 2 days; then 3 tabs for 2 days; then 2 tabs for 2 days; then 1 tab for 2 days   ROSUVASTATIN (CRESTOR) 10 MG TABLET    Take 1 tablet (10 mg total) by mouth daily.   TRIAMCINOLONE (KENALOG) 0.025 % CREAM    APPLY  CREAM TOPICALLY ONCE DAILY AS NEEDED FOR PSORIASIS  Modified Medications   No medications on file  Discontinued Medications   No medications on file    Physical Exam:  Vitals:   07/25/19 1550  BP: 122/70  Pulse: (!) 102  Temp: (!) 97.5 F (36.4 C)  TempSrc: Temporal  SpO2: 91%  Weight: 222 lb 12.8 oz (101.1 kg)  Height: 5' 3" (1.6 m)   Body mass index is 39.47 kg/m. Wt Readings from Last 3 Encounters:  07/25/19 222 lb 12.8 oz (101.1 kg)  06/24/19 223 lb (101.2 kg)  06/10/19 225 lb (102.1 kg)    Physical Exam Constitutional:      General: She is not in acute distress.    Appearance: Normal appearance. She is well-developed.  HENT:     Head: Normocephalic and atraumatic.     Right Ear: Tympanic membrane normal.     Left Ear: Tympanic membrane normal.     Nose: Nose normal.     Mouth/Throat:     Mouth: Mucous membranes are moist.     Pharynx: Oropharynx is clear.  Eyes:     Pupils: Pupils are equal, round, and reactive to light.  Neck:     Musculoskeletal: Normal range of motion and neck supple.  Cardiovascular:     Rate and Rhythm: Regular rhythm. Tachycardia present.     Heart sounds: Murmur present.  Pulmonary:     Effort: Tachypnea  present. No respiratory distress.     Breath sounds: Decreased air movement present. Wheezing (faint wheezes on exp bilaterally in bases) present. No rales.  Abdominal:     General: Bowel sounds are normal.     Palpations: Abdomen is soft.  Musculoskeletal: Normal range of motion.     Right lower leg: No edema.     Left lower leg: No edema.  Skin:    General: Skin is warm and dry.     Coloration: Skin is pale.  Neurological:     Mental Status: She is alert and oriented to person, place, and time.     Labs reviewed: Basic Metabolic Panel: Recent Labs    08/16/18 0919 12/02/18 0919 07/08/19 1139 07/15/19 1222  NA 139 140 137 138  K 4.2 4.6 4.8 3.8  CL 98 99 97 95*  CO2 32 _0 GLUCOSE 179* 178* 262* 213*  BUN 42* 44* 56* 81*  CREATININE 1.64* 1.54* 2.23* 2.31*  CALCIUM 9.3 9.3 9.7 9.3  TSH 5.66* 5.58*  --   --    Liver Function Tests: Recent Labs    08/16/18 0919 12/02/18 0919  AST 21 17  ALT 22 17  BILITOT 0.6 0.4  PROT 7.0 6.8   No results for input(s): LIPASE, AMYLASE in the last 8760 hours. No results for input(s): AMMONIA in the  last 8760 hours. CBC: Recent Labs    08/16/18 0919 12/02/18 0919 07/08/19 1139  WBC 5.5 5.6 7.0  NEUTROABS 3,784 3,702  --   HGB 11.3* 10.7* 11.2*  HCT 34.6* 33.0* 34.7*  MCV 87.4 89.9 89.6  PLT 196 214 193.0   Lipid Panel: Recent Labs    08/16/18 0919 12/02/18 0919  CHOL 182 140  HDL 58 57  LDLCALC 98 60  TRIG 157* 148  CHOLHDL 3.1 2.5   TSH: Recent Labs    08/16/18 0919 12/02/18 0919  TSH 5.66* 5.58*   A1C: Lab Results  Component Value Date   HGBA1C 8.0 (H) 12/02/2018     Assessment/Plan 1. CKD (chronic kidney disease) stage 4, GFR 15-29 ml/min (HCC) -progressively worsen renal function noted on review. May need nephrology referral after evaluation in the  hospital.   2. Shortness of breath -progressive worsening shortness of breath over the last month. She was treated with prednisone for COPD  flare but symptoms have progressively worsened, HR noted to be 90-135 and irregular on EKG, she is short of breath and O2 dropping to 87-88% - EKG 12-Lead  3. Atrial fibrillation with RVR ([place]) -sent to the hospital via EMS for further evaluation.   Carlos American. [place], [facility] [phone]

## 2019-07-26 ENCOUNTER — Observation Stay (HOSPITAL_COMMUNITY): Payer: Medicare Other

## 2019-07-26 ENCOUNTER — Other Ambulatory Visit (HOSPITAL_COMMUNITY): Payer: Medicare Other

## 2019-07-26 ENCOUNTER — Ambulatory Visit: Payer: Medicare Other | Admitting: Cardiology

## 2019-07-26 DIAGNOSIS — Z952 Presence of prosthetic heart valve: Secondary | ICD-10-CM | POA: Diagnosis not present

## 2019-07-26 DIAGNOSIS — Z96611 Presence of right artificial shoulder joint: Secondary | ICD-10-CM | POA: Diagnosis present

## 2019-07-26 DIAGNOSIS — K219 Gastro-esophageal reflux disease without esophagitis: Secondary | ICD-10-CM | POA: Diagnosis present

## 2019-07-26 DIAGNOSIS — I361 Nonrheumatic tricuspid (valve) insufficiency: Secondary | ICD-10-CM | POA: Diagnosis not present

## 2019-07-26 DIAGNOSIS — I4891 Unspecified atrial fibrillation: Secondary | ICD-10-CM | POA: Diagnosis not present

## 2019-07-26 DIAGNOSIS — I5043 Acute on chronic combined systolic (congestive) and diastolic (congestive) heart failure: Secondary | ICD-10-CM | POA: Diagnosis not present

## 2019-07-26 DIAGNOSIS — I5033 Acute on chronic diastolic (congestive) heart failure: Secondary | ICD-10-CM

## 2019-07-26 DIAGNOSIS — I130 Hypertensive heart and chronic kidney disease with heart failure and stage 1 through stage 4 chronic kidney disease, or unspecified chronic kidney disease: Secondary | ICD-10-CM | POA: Diagnosis not present

## 2019-07-26 DIAGNOSIS — E669 Obesity, unspecified: Secondary | ICD-10-CM | POA: Diagnosis present

## 2019-07-26 DIAGNOSIS — J449 Chronic obstructive pulmonary disease, unspecified: Secondary | ICD-10-CM | POA: Diagnosis not present

## 2019-07-26 DIAGNOSIS — Z8719 Personal history of other diseases of the digestive system: Secondary | ICD-10-CM | POA: Diagnosis not present

## 2019-07-26 DIAGNOSIS — E039 Hypothyroidism, unspecified: Secondary | ICD-10-CM | POA: Diagnosis present

## 2019-07-26 DIAGNOSIS — R0600 Dyspnea, unspecified: Secondary | ICD-10-CM | POA: Diagnosis present

## 2019-07-26 DIAGNOSIS — I5031 Acute diastolic (congestive) heart failure: Secondary | ICD-10-CM | POA: Diagnosis not present

## 2019-07-26 DIAGNOSIS — I480 Paroxysmal atrial fibrillation: Secondary | ICD-10-CM | POA: Diagnosis not present

## 2019-07-26 DIAGNOSIS — R0602 Shortness of breath: Secondary | ICD-10-CM | POA: Diagnosis not present

## 2019-07-26 DIAGNOSIS — I10 Essential (primary) hypertension: Secondary | ICD-10-CM | POA: Diagnosis not present

## 2019-07-26 DIAGNOSIS — N183 Chronic kidney disease, stage 3 unspecified: Secondary | ICD-10-CM | POA: Diagnosis not present

## 2019-07-26 DIAGNOSIS — Z86010 Personal history of colonic polyps: Secondary | ICD-10-CM | POA: Diagnosis not present

## 2019-07-26 DIAGNOSIS — E1165 Type 2 diabetes mellitus with hyperglycemia: Secondary | ICD-10-CM | POA: Diagnosis not present

## 2019-07-26 DIAGNOSIS — J439 Emphysema, unspecified: Secondary | ICD-10-CM | POA: Diagnosis present

## 2019-07-26 DIAGNOSIS — I447 Left bundle-branch block, unspecified: Secondary | ICD-10-CM | POA: Diagnosis present

## 2019-07-26 DIAGNOSIS — J9601 Acute respiratory failure with hypoxia: Secondary | ICD-10-CM | POA: Diagnosis not present

## 2019-07-26 DIAGNOSIS — I4819 Other persistent atrial fibrillation: Secondary | ICD-10-CM

## 2019-07-26 DIAGNOSIS — I110 Hypertensive heart disease with heart failure: Secondary | ICD-10-CM

## 2019-07-26 DIAGNOSIS — I2510 Atherosclerotic heart disease of native coronary artery without angina pectoris: Secondary | ICD-10-CM | POA: Diagnosis present

## 2019-07-26 DIAGNOSIS — L8990 Pressure ulcer of unspecified site, unspecified stage: Secondary | ICD-10-CM | POA: Insufficient documentation

## 2019-07-26 DIAGNOSIS — N179 Acute kidney failure, unspecified: Secondary | ICD-10-CM | POA: Diagnosis present

## 2019-07-26 DIAGNOSIS — I959 Hypotension, unspecified: Secondary | ICD-10-CM | POA: Diagnosis not present

## 2019-07-26 DIAGNOSIS — E1122 Type 2 diabetes mellitus with diabetic chronic kidney disease: Secondary | ICD-10-CM | POA: Diagnosis present

## 2019-07-26 DIAGNOSIS — Z20828 Contact with and (suspected) exposure to other viral communicable diseases: Secondary | ICD-10-CM | POA: Diagnosis present

## 2019-07-26 DIAGNOSIS — E785 Hyperlipidemia, unspecified: Secondary | ICD-10-CM | POA: Diagnosis present

## 2019-07-26 DIAGNOSIS — Z23 Encounter for immunization: Secondary | ICD-10-CM | POA: Diagnosis not present

## 2019-07-26 DIAGNOSIS — J9621 Acute and chronic respiratory failure with hypoxia: Secondary | ICD-10-CM | POA: Diagnosis not present

## 2019-07-26 DIAGNOSIS — L89151 Pressure ulcer of sacral region, stage 1: Secondary | ICD-10-CM | POA: Diagnosis present

## 2019-07-26 DIAGNOSIS — I340 Nonrheumatic mitral (valve) insufficiency: Secondary | ICD-10-CM | POA: Diagnosis not present

## 2019-07-26 DIAGNOSIS — Q211 Atrial septal defect: Secondary | ICD-10-CM | POA: Diagnosis not present

## 2019-07-26 LAB — CBC
HCT: 36.5 % (ref 36.0–46.0)
Hemoglobin: 11.2 g/dL — ABNORMAL LOW (ref 12.0–15.0)
MCH: 28.4 pg (ref 26.0–34.0)
MCHC: 30.7 g/dL (ref 30.0–36.0)
MCV: 92.4 fL (ref 80.0–100.0)
Platelets: 165 10*3/uL (ref 150–400)
RBC: 3.95 MIL/uL (ref 3.87–5.11)
RDW: 16.5 % — ABNORMAL HIGH (ref 11.5–15.5)
WBC: 6.2 10*3/uL (ref 4.0–10.5)
nRBC: 0 % (ref 0.0–0.2)

## 2019-07-26 LAB — GLUCOSE, CAPILLARY
Glucose-Capillary: 244 mg/dL — ABNORMAL HIGH (ref 70–99)
Glucose-Capillary: 239 mg/dL — ABNORMAL HIGH (ref 70–99)
Glucose-Capillary: 266 mg/dL — ABNORMAL HIGH (ref 70–99)
Glucose-Capillary: 224 mg/dL — ABNORMAL HIGH (ref 70–99)

## 2019-07-26 LAB — BASIC METABOLIC PANEL
Anion gap: 13 (ref 5–15)
BUN: 62 mg/dL — ABNORMAL HIGH (ref 8–23)
CO2: 26 mmol/L (ref 22–32)
Calcium: 8.9 mg/dL (ref 8.9–10.3)
Chloride: 99 mmol/L (ref 98–111)
Creatinine, Ser: 2.45 mg/dL — ABNORMAL HIGH (ref 0.44–1.00)
GFR calc Af Amer: 21 mL/min — ABNORMAL LOW (ref >60)
GFR calc non Af Amer: 18 mL/min — ABNORMAL LOW (ref >60)
Glucose, Bld: 259 mg/dL — ABNORMAL HIGH (ref 70–99)
Potassium: 4.9 mmol/L (ref 3.5–5.1)
Sodium: 138 mmol/L (ref 135–145)

## 2019-07-26 LAB — URINALYSIS, ROUTINE W REFLEX MICROSCOPIC
Bilirubin Urine: NEGATIVE
Glucose, UA: NEGATIVE mg/dL
Hgb urine dipstick: NEGATIVE
Ketones, ur: NEGATIVE mg/dL
Leukocytes,Ua: NEGATIVE
Nitrite: NEGATIVE
Protein, ur: NEGATIVE mg/dL
Specific Gravity, Urine: 1.013 (ref 1.005–1.030)
pH: 5 (ref 5.0–8.0)

## 2019-07-26 LAB — PROCALCITONIN: Procalcitonin: <0.1 ng/mL

## 2019-07-26 LAB — TROPONIN I (HIGH SENSITIVITY): Troponin I (High Sensitivity): 29 ng/L — ABNORMAL HIGH (ref <18)

## 2019-07-26 LAB — MAGNESIUM: Magnesium: 2.3 mg/dL (ref 1.7–2.4)

## 2019-07-26 LAB — TSH: TSH: 1.749 u[IU]/mL (ref 0.350–4.500)

## 2019-07-26 MED ORDER — FUROSEMIDE 10 MG/ML IJ SOLN
40.0000 mg | Freq: Every day | INTRAMUSCULAR | Status: DC
  Administered 2019-07-26: 40 mg via INTRAVENOUS
  Filled 2019-07-26 (×2): qty 4

## 2019-07-26 MED ORDER — METHYLPREDNISOLONE SODIUM SUCC 40 MG IJ SOLR
40.0000 mg | Freq: Every day | INTRAMUSCULAR | Status: DC
  Administered 2019-07-26: 40 mg via INTRAVENOUS
  Filled 2019-07-26: qty 1

## 2019-07-26 MED ORDER — TECHNETIUM TO 99M ALBUMIN AGGREGATED
1.6000 | Freq: Once | INTRAVENOUS | Status: AC | PRN
  Administered 2019-07-26: 1.6 via INTRAVENOUS

## 2019-07-26 MED ORDER — SODIUM CHLORIDE 0.9 % IV SOLN
100.0000 mg | Freq: Two times a day (BID) | INTRAVENOUS | Status: DC
  Administered 2019-07-26: 100 mg via INTRAVENOUS
  Filled 2019-07-26 (×2): qty 100

## 2019-07-26 MED ORDER — FUROSEMIDE 10 MG/ML IJ SOLN
40.0000 mg | Freq: Two times a day (BID) | INTRAMUSCULAR | Status: DC
  Administered 2019-07-26: 18:00:00 40 mg via INTRAVENOUS
  Filled 2019-07-26: qty 4

## 2019-07-26 MED ORDER — IPRATROPIUM-ALBUTEROL 0.5-2.5 (3) MG/3ML IN SOLN
3.0000 mL | RESPIRATORY_TRACT | Status: DC
  Administered 2019-07-26: 3 mL via RESPIRATORY_TRACT
  Filled 2019-07-26: qty 3

## 2019-07-26 MED ORDER — FUROSEMIDE 10 MG/ML IJ SOLN
40.0000 mg | Freq: Once | INTRAMUSCULAR | Status: AC
  Administered 2019-07-26: 40 mg via INTRAVENOUS
  Filled 2019-07-26: qty 4

## 2019-07-26 MED ORDER — SODIUM CHLORIDE 0.9 % IV SOLN
INTRAVENOUS | Status: DC | PRN
  Administered 2019-07-26: 250 mL via INTRAVENOUS

## 2019-07-26 MED ORDER — FUROSEMIDE 10 MG/ML IJ SOLN
80.0000 mg | Freq: Two times a day (BID) | INTRAMUSCULAR | Status: DC
  Administered 2019-07-27 (×2): 80 mg via INTRAVENOUS
  Filled 2019-07-26 (×2): qty 8

## 2019-07-26 MED ORDER — IPRATROPIUM-ALBUTEROL 0.5-2.5 (3) MG/3ML IN SOLN
3.0000 mL | Freq: Two times a day (BID) | RESPIRATORY_TRACT | Status: DC
  Administered 2019-07-26 – 2019-07-29 (×6): 3 mL via RESPIRATORY_TRACT
  Filled 2019-07-26 (×6): qty 3

## 2019-07-26 MED ORDER — ALBUTEROL SULFATE (2.5 MG/3ML) 0.083% IN NEBU: 2.5000 mg | INHALATION_SOLUTION | RESPIRATORY_TRACT | Status: DC | PRN

## 2019-07-26 NOTE — Progress Notes (Signed)
Progress Note    Patricia [name]  IOE:[id number] DOB: [birth year]  DOA: 07/25/2019 PCP: [name], NP    Brief Narrative:     Medical records reviewed and are as summarized below:  Patricia [name] is an 83 y.o. female with history of diastolic CHF last EF measured in December 2018 was 14 to 18% with diastolic dysfunction, status post TAVR, COPD, A. fib, anemia, chronic kidney disease stage III, diabetes mellitus presents to the ER because of worsening shortness of breath.  Patient states she has been short of breath for last few weeks which has been progressively worsening.  About 2 weeks ago patient was placed on steroids by her pulmonologist for bronchitis.     Assessment/Plan:   Principal Problem:   Acute on chronic diastolic (congestive) heart failure (HCC) Active Problems:   Essential hypertension   DM type 2 (diabetes mellitus, type 2) (HCC)   Hypothyroidism   COPD (chronic obstructive pulmonary disease) (HCC)   PAF (paroxysmal atrial fibrillation) (HCC)   S/P TAVR (transcatheter aortic valve replacement)   Acute diastolic CHF (congestive heart failure) (HCC)   Pressure injury of skin   Acute respiratory failure with hypoxia likely fromCHF  -denies being on home O2 - last EF measured in 2018 was 60 to 29% with diastolic dysfunction with known history of status post TAVR  - Lasix 40 mg IV BID -nebulizer treatment  - Follow intake output metabolic panel and daily weight/daily labs -V/Q scan pending -cardiology consult pending -pro calcitonin negative so will d/c abx -patient has already been on steroids w/o improvement so will d/c and follow respiratory status closely  Atrial fibrillation  -on amiodarone  -Not on anticoagulation secondary GI bleed.  Chronic kidney disease stage III with worsening renal function over the last 1 month.   -Check urinalysis.   -hold Cozaar.  - Closely follow intake output as we are trying to diurese  Hypothyroidism   -Synthroid.  Diabetes mellitus  -sliding scale coverage  Anemia likely from renal disease follow CBC.  Has history of GI bleed  Hyperlipidemia  -statin  obesity Body mass index is 39.22 kg/m.   Family Communication/Anticipated D/C date and plan/Code Status   DVT prophylaxis: Lovenox ordered. Code Status: Full Code.  Family Communication:  Disposition Plan: needs to remain inpatient for V/Q scan, IV lasix and cardiology consult   Medical Consultants:    cardiology  Subjective:   Still not back to her baseline with breathing   Objective:    Vitals:   07/26/19 0746 07/26/19 0747 07/26/19 0851 07/26/19 0905  BP:   121/66 (!) 145/119  Pulse:   88 100  Resp:    20  Temp:    98.3 F (36.8 C)  TempSrc:    Oral  SpO2: 95% 95%  94%  Weight:      Height:        Intake/Output Summary (Last 24 hours) at 07/26/2019 1051 Last data filed at 07/26/2019 9371 Gross per 24 hour  Intake -  Output 700 ml  Net -700 ml   Filed Weights   07/25/19 1751 07/26/19 0452  Weight: 101.1 kg 100.4 kg    Exam: In bed, NAD Appears winded- On O2 Diminished breath sounds, crackles at bases +edema in LE +Bs, obese abdomen  Data Reviewed:   I have personally reviewed following labs and imaging studies:  Labs: Labs show the following:   Basic Metabolic Panel: Recent Labs  Lab 07/25/19 1805 07/25/19 2238 07/26/19  0431  NA 137  --  138  K 4.3  --  4.9  CL 98  --  99  CO2 23  --  26  GLUCOSE 171*  --  259*  BUN 62*  --  62*  CREATININE 2.59* 2.54* 2.45*  CALCIUM 9.0  --  8.9  MG  --   --  2.3   GFR Estimated Creatinine Clearance: 20.7 mL/min (A) (by C-G formula based on SCr of 2.45 mg/dL (H)). Liver Function Tests: No results for input(s): AST, ALT, ALKPHOS, BILITOT, PROT, ALBUMIN in the last 168 hours. No results for input(s): LIPASE, AMYLASE in the last 168 hours. No results for input(s): AMMONIA in the last 168 hours. Coagulation profile No results for input(s):  INR, PROTIME in the last 168 hours.  CBC: Recent Labs  Lab 07/25/19 1805 07/25/19 2238 07/26/19 0431  WBC 6.2 6.4 6.2  NEUTROABS 5.0  --   --   HGB 10.9* 10.9* 11.2*  HCT 36.4 36.1 36.5  MCV 94.8 93.0 92.4  PLT 166 168 165   Cardiac Enzymes: No results for input(s): CKTOTAL, CKMB, CKMBINDEX, TROPONINI in the last 168 hours. BNP (last 3 results) Recent Labs    08/12/18 1007 07/08/19 1139 07/15/19 1222  PROBNP 312.0* 1,184.0* 835.0*   CBG: Recent Labs  Lab 07/25/19 2240 07/26/19 0656  GLUCAP 177* 244*   D-Dimer: No results for input(s): DDIMER in the last 72 hours. Hgb A1c: Recent Labs    07/25/19 2148  HGBA1C 8.0*   Lipid Profile: No results for input(s): CHOL, HDL, LDLCALC, TRIG, CHOLHDL, LDLDIRECT in the last 72 hours. Thyroid function studies: Recent Labs    07/26/19 0431  TSH 1.749   Anemia work up: No results for input(s): VITAMINB12, FOLATE, FERRITIN, TIBC, IRON, RETICCTPCT in the last 72 hours. Sepsis Labs: Recent Labs  Lab 07/25/19 1805 07/25/19 2238 07/26/19 0431  PROCALCITON  --   --  <0.10  WBC 6.2 6.4 6.2    Microbiology Recent Results (from the past 240 hour(s))  SARS Coronavirus 2 [facility] order, Performed in [facility] hospital lab) Nasopharyngeal Nasopharyngeal Swab     Status: None   Collection Time: 07/25/19  6:24 PM   Specimen: Nasopharyngeal Swab  Result Value Ref Range Status   SARS Coronavirus 2 NEGATIVE NEGATIVE Final    Comment: (NOTE) If result is NEGATIVE SARS-CoV-2 target nucleic acids are NOT DETECTED. The SARS-CoV-2 RNA is generally detectable in upper and lower  respiratory specimens during the acute phase of infection. The lowest  concentration of SARS-CoV-2 viral copies this assay can detect is 250  copies / mL. A negative result does not preclude SARS-CoV-2 infection  and should not be used as the sole basis for treatment or other  patient management decisions.  A negative result may occur with  improper  specimen collection / handling, submission of specimen other  than nasopharyngeal swab, presence of viral mutation(s) within the  areas targeted by this assay, and inadequate number of viral copies  (<250 copies / mL). A negative result must be combined with clinical  observations, patient history, and epidemiological information. If result is POSITIVE SARS-CoV-2 target nucleic acids are DETECTED. The SARS-CoV-2 RNA is generally detectable in upper and lower  respiratory specimens dur ing the acute phase of infection.  Positive  results are indicative of active infection with SARS-CoV-2.  Clinical  correlation with patient history and other diagnostic information is  necessary to determine patient infection status.  Positive results do  not rule out bacterial infection or co-infection with other viruses. If result is PRESUMPTIVE POSTIVE SARS-CoV-2 nucleic acids MAY BE PRESENT.   A presumptive positive result was obtained on the submitted specimen  and confirmed on repeat testing.  While 2019 novel coronavirus  (SARS-CoV-2) nucleic acids may be present in the submitted sample  additional confirmatory testing may be necessary for epidemiological  and / or clinical management purposes  to differentiate between  SARS-CoV-2 and other Sarbecovirus currently known to infect humans.  If clinically indicated additional testing with an alternate test  methodology [phone]) is advised. The SARS-CoV-2 RNA is generally  detectable in upper and lower respiratory sp ecimens during the acute  phase of infection. The expected result is Negative. Fact Sheet for Patients:  StrictlyIdeas.no Fact Sheet for Healthcare Providers: BankingDealers.co.za This test is not yet approved or cleared by the [place] FDA and has been authorized for detection and/or diagnosis of SARS-CoV-2 by FDA under an Emergency Use Authorization (EUA).  This EUA will remain in  effect (meaning this test can be used) for the duration of the COVID-19 declaration under Section 564(b)(1) of the Act, 21 U.S.C. section 360bbb-3(b)(1), unless the authorization is terminated or revoked sooner. Performed at [facility], [place] [address], [place], [name] [address]     Procedures and diagnostic studies:  Dg Chest Portable 1 View  Result Date: 07/25/2019 CLINICAL DATA:  Chest pain and shortness of breath EXAM: PORTABLE CHEST 1 VIEW COMPARISON:  July 08, 2019 FINDINGS: Stable cardiomegaly. No pneumothorax. No nodules or masses. The left lung is clear. There is a small effusion on the right with mild opacity in the right base, more prominent the interval. No other changes. IMPRESSION: 1. There is a small right pleural effusion. Right basilar opacity is more prominent the interval and could represent developing infiltrate/pneumonia versus atelectasis. Electronically Signed   By: Dorise Bullion III M.D   On: 07/25/2019 18:36    Medications:   . amiodarone  200 mg Oral Daily  . doxazosin  4 mg Oral Daily  . enoxaparin (LOVENOX) injection  30 mg Subcutaneous Q24H  . ferrous sulfate  325 mg Oral BID  . fluticasone furoate-vilanterol  1 puff Inhalation Daily  . furosemide  40 mg Intravenous BID  . insulin aspart  0-9 Units Subcutaneous TID WC  . ipratropium-albuterol  3 mL Nebulization BID  . levothyroxine  88 mcg Oral Q0600  . methylPREDNISolone (SOLU-MEDROL) injection  40 mg Intravenous Daily  . pantoprazole  40 mg Oral BID  . rosuvastatin  10 mg Oral Daily   Continuous Infusions: . sodium chloride 250 mL (07/26/19 0856)     LOS: 0 days   Geradine Girt  [facility]   How to contact the [facility] Attending or Consulting provider [place] or covering provider during after hours [place], for this patient?  1. Check the care team in [facility] and look for a) attending/consulting [facility] provider listed and b) the [facility] team listed 2. Log into [email or web address] and use [facility]'s universal password to access. If you do not have the password, please contact the hospital operator. 3. Locate the [facility] provider you are looking for under [facility] and page to a number that you can be directly reached. 4. If you still have difficulty reaching the provider, please page the [facility] (Director on Call) for the Hospitalists listed on amion for assistance.  07/26/2019, 10:51 AM

## 2019-07-26 NOTE — Consult Note (Addendum)
Cardiology Consultation:   Patient ID: [name] MRN: [id number]; DOB: 08-15-1936  Admit date: 07/25/2019 Date of Consult: 07/26/2019  Primary Care Provider: [name], NP Primary Cardiologist: [name] Martinique, MD  Primary Electrophysiologist:  None    Patient Profile:   Patricia [name] is a 83 y.o. female with a hx of persistent atrial fibrillation, hypertension, obesity, diabetes, hyperlipidemia, anemia, aortic stenosis status post TAVR ('17) and chronic diastolic heart failure who is being seen today for the evaluation of shortness of breath at the request of Dr. Eliseo Squires.  History of Present Illness:   Patricia [name] is an 83 year old female with past medical history noted above.  She was admitted to the hospital back in December 2016 with complaints of dyspnea and found to be in atrial fibrillation with RVR along with pulmonary edema.  Echocardiogram at that time showed an EF of 55 to 60% with moderate to severe AS.  She was placed on Xarelto and started on amiodarone at that time.  Underwent TEE/DCCV with conversion to sinus rhythm.  In 2017 had a postop drop in her hemoglobin and required transfusion.  Underwent EGD and colonoscopy with both gastric and colonic polyps noted but no bleeding.  She was seen back in the office in October 2017 with increased dyspnea.  Underwent cardiac cath showing severe stenosis with single-vessel disease of 75% and OM 3. She was admitted again in November 2017 with acute on chronic CHF and diuresed.  In December 2017 she was referred to the valve clinic and underwent TAVR on 10/14/2016.  Postop course was uncomplicated.  Repeat echocardiogram in January 2018 showed higher than expected AV valve prosthesis gradient felt to be related to vigorous LV function and valve/patient mismatch.  He had been transitioned to Eliquis.  In March 2018 she was noted to have progressive anemia with dark stools and drops her hemoglobin to 7.4.  She was transfused and EGD  showed multiple gastric polyps and some oozing at AVMs that were cauterized.  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   In January 2019 presented with cough that had not improved with stopping her ACE inhibitor.  Recent CT chest showed emphysema and periapical scarring.  She was found again to be in A. fib and underwent successful DCCV with her amiodarone dose increased.  Admitted in March 2019 with abdominal pain and severe weakness.  Hemoglobin found to be 4.2.  She received 4 units of PRBCs and anticoagulation was stopped.  Hemoglobin stabilized to 8.4 at the time of discharge.  Further work-up with upper endoscopy showed normal esophagus, erosive gastropathy, normal duodenum.  CT abdomen showed no acute source of bleeding.  She was last seen in follow-up on 08/2018.  She reported recent course of prednisone and increased Lasix after worsening shortness of breath.  Did note some improvement with this.  She also reported getting IV iron infusions every other month, but had had no further bleeding.  It was noted that given her subsequent GI bleeding she would not be a candidate for long-term anticoagulation.  Plan was to try and maintain sinus rhythm with amiodarone as monotherapy.  Her weight was stable at this office appointment and she was continued on Lasix 80 mg daily.   She reports worsening shortness of breath over the past several weeks.  States that her Lasix was recently increased to 120 mg daily for 5 days about 3 weeks ago as she had  increased volume.  This did help initially but developed recurrent shortness of breath.  She was seen through a second virtual visit with her pulmonologist who put her on a short course of steroids which did improve her symptoms, but had recurrence once steroids were stopped.  States prior to admission she has had no chest pain, but does have fullness at times whenever her breathing is difficult.   Overall reports her weights have been stable and she has not had significant lower extremity edema or abdominal fullness.  She does not normally wear oxygen at home.  Does use nebulizers for her COPD.  Presented to the ED on 9/28 with progressive shortness of breath.  In the ED her labs showed stable electrolytes creatinine 2.5, BNP 943, high-sensitivity troponin 29>> 30>> 29, hemoglobin 10.9.  Noted to be hypoxic in the ED. Chest x-ray showed small right pleural effusion with possible pneumonia versus atelectasis.  EKG in the ED showed rate controlled A. fib with left bundle branch block.  She was given IV Lasix in the ED and admitted to internal medicine for further work-up.  Given antibiotics on admission. Underwent VQ scan which was negative for PE.  Procalcitonin was was negative and antibiotics were stopped today.  In talking with patient she does report feeling somewhat better today.  Remains on 2 L nasal cannula.  Heart Pathway Score:     Past Medical History:  Diagnosis Date  . Allergy   . Anemia, unspecified   . Arthritis   . Asthma   . Atrial fibrillation status post cardioversion [facility]) 09/2015   s/p TEE/DCCV>>SR on amio  . Benign neoplasm of colon   . Carpal tunnel syndrome   . Cellulitis and abscess of finger, unspecified   . Cerumen impaction   . Cervicalgia   . CHF (congestive heart failure) ([place])   . Chronic airway obstruction, not elsewhere classified   . Chronic anticoagulation 09/2015   Eliquis  . Diarrhea   . Diffuse cystic mastopathy   . Edema   . Encounter for long-term (current) use of other medications   . GERD (gastroesophageal reflux disease)   . Heart murmur   . Lumbago   . Neck pain 05/23/2015  . Obesity, unspecified   . Other and unspecified hyperlipidemia   . Other psoriasis   . Pain in joint, lower leg   . PONV (postoperative nausea and vomiting)   . Primary localized osteoarthrosis of right shoulder 12/18/2015  . S/P TAVR (transcatheter aortic valve  replacement) 10/14/2016   23 mm Edwards Sapien 3 transcatheter heart valve placed via percutaneous left transfemoral approach  . Scoliosis (and kyphoscoliosis), idiopathic   . Shortness of breath dyspnea    with exertion  . Type II or unspecified type diabetes mellitus without mention of complication, uncontrolled   . Unspecified essential hypertension   . Unspecified hemorrhoids without mention of complication   . Unspecified hypothyroidism   . Urge incontinence     Past Surgical History:  Procedure Laterality Date  . ABDOMINAL HYSTERECTOMY  1987   complete  . BREAST BIOPSY Right   . BREAST EXCISIONAL BIOPSY Left 2011  . BREAST EXCISIONAL BIOPSY Right    x2  . BREAST SURGERY     right breast 3 surgeries [phone]  . CARDIAC CATHETERIZATION N/A 09/02/2016   Procedure: Right/Left Heart Cath and Coronary Angiography;  Surgeon: [name] M Martinique, MD;  Location: [place] CV LAB;  Service: Cardiovascular;  Laterality: N/A;  . CARDIOVERSION N/A 10/19/2015  Procedure: CARDIOVERSION;  Surgeon: Lelon Perla, MD;  Location: [facility] ENDOSCOPY;  Service: Cardiovascular;  Laterality: N/A;  . CARDIOVERSION N/A 11/13/2017   Procedure: CARDIOVERSION;  Surgeon: Jerline Pain, MD;  Location: [place];  Service: Cardiovascular;  Laterality: N/A;  . [place]  . COLON SURGERY     2004,2007,2013.  3 times colon surgieres  . ESOPHAGOGASTRODUODENOSCOPY (EGD) WITH PROPOFOL Left 03/13/2017   Procedure: ESOPHAGOGASTRODUODENOSCOPY (EGD) WITH PROPOFOL;  Surgeon: Ronnette Juniper, MD;  Location: [place];  Service: Gastroenterology;  Laterality: Left;  . ESOPHAGOGASTRODUODENOSCOPY (EGD) WITH PROPOFOL Left 01/22/2018   Procedure: ESOPHAGOGASTRODUODENOSCOPY (EGD) WITH PROPOFOL;  Surgeon: Arta Silence, MD;  Location: [facility] ENDOSCOPY;  Service: Endoscopy;  Laterality: Left;  . EXCISE LE MANDIBULAR LYMPH NODE T     DR C. NEWMAN  . FOOT SURGERY  1989  . GIVENS CAPSULE STUDY Left  01/23/2018   Procedure: GIVENS CAPSULE STUDY;  Surgeon: Wilford Corner, MD;  Location: [facility] ENDOSCOPY;  Service: Endoscopy;  Laterality: Left;  . LEFT SHOULDER ARTHROSCOPY    . 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 NEUROPLASTY / TRANSPOSITION MEDIAN NERVE AT CARPAL TUNNEL BILATERAL  05/2003  . TEE WITHOUT CARDIOVERSION N/A 10/19/2015   Procedure: Transesophageal Echocardiogram (TEE) ;  Surgeon: Lelon Perla, MD;  Location: [facility] ENDOSCOPY;  Service: Cardiovascular;  Laterality: N/A;  . TEE WITHOUT CARDIOVERSION N/A 10/14/2016   Procedure: TRANSESOPHAGEAL ECHOCARDIOGRAM (TEE);  Surgeon: Sherren Mocha, MD;  Location: [place];  Service: Open Heart Surgery;  Laterality: N/A;  . TOTAL SHOULDER ARTHROPLASTY Right 12/18/2015   Procedure: TOTAL SHOULDER ARTHROPLASTY;  Surgeon: [name], MD;  Location: [place];  Service: Orthopedics;  Laterality: Right;  . TRANSCATHETER AORTIC VALVE REPLACEMENT, TRANSFEMORAL N/A 10/14/2016   Procedure: TRANSCATHETER AORTIC VALVE REPLACEMENT, TRANSFEMORAL;  Surgeon: Sherren Mocha, MD;  Location: [place];  Service: Open Heart Surgery;  Laterality: N/A;  . TRIGGER FINGER RELEASE Left   . VAGINAL CYST REMOVED  1967     Home Medications:  Prior to Admission medications   Medication Sig Start Date End Date Taking? Authorizing Provider  acetaminophen (TYLENOL) 500 MG tablet Take 500 mg by mouth every 6 (six) hours as needed for mild pain.   Yes [provider]  acetaminophen (TYLENOL) 650 MG CR tablet Take 1,300 mg by mouth every 8 (eight) hours as needed for pain.   Yes [provider]  albuterol (PROVENTIL) (2.5 MG/3ML) 0.083% nebulizer solution USE 1 VIAL IN NEBULIZER EVERY 4 HOURS AND AS NEEDED. Generic: VENTOLIN Patient taking differently: Take 2.5 mg by nebulization every 4 (four) hours as needed for wheezing or shortness of breath.  03/17/19  Yes Fenton Foy, NP  albuterol (VENTOLIN HFA) 108 (90 Base) MCG/ACT inhaler Inhale 2 puffs into the lungs  every 6 (six) hours as needed for wheezing or shortness of breath. 11/19/17  Yes Brand Males, MD  amiodarone (PACERONE) 200 MG tablet TAKE 1 TABLET BY MOUTH TWICE DAILY FOR 30 DAYS. THEN REDUCE TO 1 TABLET BY MOUTH EVERY DAY THEREAFTER Patient taking differently: Take 200 mg by mouth daily before breakfast.  06/27/19  Yes Martinique, [name] M, MD  BREO ELLIPTA 200-25 MCG/INH AEPB INHALE 1 PUFF INTO THE LUNGS DAILY Patient taking differently: Inhale 1 puff into the lungs daily.  04/08/19  Yes Martyn Ehrich, NP  cetirizine (ZYRTEC) 10 MG tablet Take 10 mg by mouth daily as needed for allergies.    Yes [provider]  cholecalciferol (VITAMIN D) 1000 units  tablet Take 1,000 Units by mouth at bedtime.    Yes [provider]  doxazosin (CARDURA) 4 MG tablet TAKE 1 TABLET BY MOUTH ONCE DAILY TO HELP CONTROL BLOOD PRESSURE Patient taking differently: Take 4 mg by mouth daily before breakfast. TAKE 1 TABLET BY MOUTH ONCE DAILY TO HELP CONTROL BLOOD PRESSURE 03/15/19  Yes [name], NP  ferrous sulfate 325 (65 FE) MG EC tablet Take 325 mg by mouth 2 (two) times daily.   Yes [provider]  furosemide (LASIX) 40 MG tablet APPOINTMENT OVERDUE, take 2 by mouth daily Patient taking differently: Take 80 mg by mouth daily before breakfast. APPOINTMENT OVERDUE, 05/23/19  Yes Eubanks, Carlos American, NP  glimepiride (AMARYL) 2 MG tablet TAKE 1 TABLET BY MOUTH ONCE DAILY AT BREAKFAST TO CONTROL GLUCOSE Patient taking differently: Take 2 mg by mouth daily with breakfast. To control glucose 03/03/19  Yes Eubanks, Carlos American, NP  JANUVIA 50 MG tablet TAKE 1 TABLET(50 MG) BY MOUTH DAILY Patient taking differently: Take 50 mg by mouth daily with breakfast.  04/15/19  Yes [name], NP  levothyroxine (SYNTHROID) 88 MCG tablet TAKE 1 TABLET BY MOUTH DAILY ON AN EMPTY STOMACH Patient taking differently: Take 88 mcg by mouth daily before breakfast. Take on an empty stomach 06/23/19  Yes  [name], NP  losartan (COZAAR) 25 MG tablet TAKE 1 TABLET BY MOUTH DAILY Patient taking differently: Take 25 mg by mouth at bedtime.  06/23/19  Yes [name], NP  pantoprazole (PROTONIX) 40 MG tablet TAKE 1 TABLET(40 MG) BY MOUTH TWICE DAILY Patient taking differently: Take 40 mg by mouth 2 (two) times daily.  03/01/19  Yes [name], NP  potassium gluconate 595 (99 K) MG TABS tablet Take 1,190 mg by mouth daily before breakfast. Take with furosemide (lasix)   Yes [provider]  rosuvastatin (CRESTOR) 10 MG tablet Take 1 tablet (10 mg total) by mouth daily. Patient taking differently: Take 10 mg by mouth at bedtime.  09/02/18  Yes [name], NP  triamcinolone (KENALOG) 0.025 % cream APPLY  CREAM TOPICALLY ONCE DAILY AS NEEDED FOR PSORIASIS Patient taking differently: Apply 1 application topically daily as needed (psoriasis).  04/15/18  Yes [name], NP  Blood Glucose Monitoring Suppl (ONE TOUCH ULTRA 2) w/Device KIT Use as instructed to test blood sugar twice daily DX: E11.9 10/04/18   Reed, Tiffany L, DO  glucose blood (ONE TOUCH ULTRA TEST) test strip Use as instructed to test blood sugar twice daily DX: E11.9 10/04/18   Reed, Tiffany L, DO  Lancets [facility] ULTRASOFT) lancets Use as instructed to test blood sugar twice daily DX: E11.9 10/04/18   Gayland Curry, DO    Inpatient Medications: Scheduled Meds: . amiodarone  200 mg Oral Daily  . doxazosin  4 mg Oral Daily  . enoxaparin (LOVENOX) injection  30 mg Subcutaneous Q24H  . ferrous sulfate  325 mg Oral BID  . fluticasone furoate-vilanterol  1 puff Inhalation Daily  . furosemide  40 mg Intravenous BID  . insulin aspart  0-9 Units Subcutaneous TID WC  . ipratropium-albuterol  3 mL Nebulization BID  . levothyroxine  88 mcg Oral Q0600  . pantoprazole  40 mg Oral BID  . rosuvastatin  10 mg Oral Daily   Continuous Infusions: . sodium chloride 250 mL (07/26/19 0856)   PRN Meds:  sodium chloride, acetaminophen **OR** acetaminophen, albuterol, ondansetron **OR** ondansetron (ZOFRAN) IV  Allergies:  Allergies  Allergen Reactions  . Codeine Other (See Comments)    "Spaces out" the patient and she cannot walk well  . Vesicare [Solifenacin] Itching    Social History:   Social History   Socioeconomic History  . Marital status: Divorced    Spouse name: Not on file  . Number of children: 1  . Years of education: Not on file  . Highest education level: Not on file  Occupational History  . Occupation: Retired Market researcher for [employer]  . Financial resource strain: Not hard at all  . Food insecurity    Worry: Never true    Inability: Never true  . Transportation needs    Medical: No    Non-medical: No  Tobacco Use  . Smoking status: Former Smoker    Packs/day: 1.00    Years: 40.00    Pack years: 40.00    Quit date: 09/07/1989    Years since quitting: 29.9  . Smokeless tobacco: Never Used  Substance and Sexual Activity  . Alcohol use: No    Alcohol/week: 0.0 standard drinks  . Drug use: No  . Sexual activity: Never  Lifestyle  . Physical activity    Days per week: 0 days    Minutes per session: 0 min  . Stress: To some extent  Relationships  . Social connections    Talks on phone: More than three times a week    Gets together: Once a week    Attends religious service: Never    Active member of club or organization: No    Attends meetings of clubs or organizations: Never    Relationship status: Widowed  . Intimate partner violence    Fear of current or ex partner: No    Emotionally abused: No    Physically abused: No    Forced sexual activity: No  Other Topics Concern  . Not on file  Social History Narrative   Her daughter & 3 grandchildren live in the area.      Family History:    Family History  Problem Relation Age of Onset  . Diabetes Father   . Stroke Father   . Heart disease Father   . Liver cancer  Sister   . Heart disease Brother   . Asthma Sister   . Arthritis Sister   . Breast cancer Neg Hx      ROS:  Please see the history of present illness.   All other ROS reviewed and negative.     Physical Exam/Data:   Vitals:   07/26/19 0747 07/26/19 0851 07/26/19 0905 07/26/19 1253  BP:  121/66 (!) 145/119 (!) 136/113  Pulse:  88 100 94  Resp:   20 18  Temp:   98.3 F (36.8 C) 97.8 F (36.6 C)  TempSrc:   Oral Oral  SpO2: 95%  94% 96%  Weight:      Height:        Intake/Output Summary (Last 24 hours) at 07/26/2019 1453 Last data filed at 07/26/2019 1239 Gross per 24 hour  Intake 240 ml  Output 950 ml  Net -710 ml   Last 3 Weights 07/26/2019 07/25/2019 07/25/2019  Weight (lbs) 221 lb 6.4 oz 222 lb 12.8 oz 222 lb 12.8 oz  Weight (kg) 100.426 kg 101.061 kg 101.061 kg     Body mass index is 39.22 kg/m.  General:  Well nourished, well developed, in no acute distress. HEENT: normal Neck: no JVD Endocrine:  No thryomegaly  Vascular: No carotid bruits Cardiac:  normal S1, S2; Irreg Irreg; soft systolic murmur  Lungs:  clear to auscultation bilaterally, no wheezing, rhonchi or rales  Abd: soft, nontender, no hepatomegaly  Ext: trace LE to LLE Musculoskeletal:  No deformities, BUE and BLE strength normal and equal Skin: warm and dry  Neuro:  CNs 2-12 intact, no focal abnormalities noted Psych:  Normal affect   EKG:  The EKG was personally reviewed and demonstrates:  Afib rate 95 with LBBB  Relevant CV Studies:  TTE: 09/2017  Study Conclusions  - Left ventricle: The cavity size was normal. Wall thickness was   increased in a pattern of moderate LVH. Systolic function was   normal. The estimated ejection fraction was in the range of 60%   to 65%. Wall motion was normal; there were no regional wall   motion abnormalities. Diastolic dysfunction with elevated LV   filling pressure. - Aortic valve: s/p TAVR. No obstruction or paravalvular leak. Mean   gradient (S): 24  mm Hg. Peak gradient (S): 47 mm Hg. Valve area   (VTI): 1.19 cm^2. Valve area (Vmax): 1.09 cm^2. Valve area   (Vmean): 1.07 cm^2. - Mitral valve: Calcified annulus. Mildly thickened leaflets . Mild   stenosis. There was moderate regurgitation. - Left atrium: Severely dilated. - Right atrium: Mildly dilated. - Tricuspid valve: There was mild regurgitation. - Pulmonary arteries: PA peak pressure: 51 mm Hg (S). - Inferior vena cava: The vessel was normal in size. The   respirophasic diameter changes were in the normal range (>= 50%),   consistent with normal central venous pressure.  Impressions:  - Compared to a prior study in 10/2016, the TAVR valve appears   stable.   Laboratory Data:  High Sensitivity Troponin:   Recent Labs  Lab 07/25/19 1805 07/25/19 2018 07/26/19 0431  TROPONINIHS 29* 30* 29*     Chemistry Recent Labs  Lab 07/25/19 1805 07/25/19 2238 07/26/19 0431  NA 137  --  138  K 4.3  --  4.9  CL 98  --  99  CO2 23  --  26  GLUCOSE 171*  --  259*  BUN 62*  --  62*  CREATININE 2.59* 2.54* 2.45*  CALCIUM 9.0  --  8.9  GFRNONAA 17* 17* 18*  GFRAA 19* 20* 21*  ANIONGAP 16*  --  13    No results for input(s): PROT, ALBUMIN, AST, ALT, ALKPHOS, BILITOT in the last 168 hours. Hematology Recent Labs  Lab 07/25/19 1805 07/25/19 2238 07/26/19 0431  WBC 6.2 6.4 6.2  RBC 3.84* 3.88 3.95  HGB 10.9* 10.9* 11.2*  HCT 36.4 36.1 36.5  MCV 94.8 93.0 92.4  MCH 28.4 28.1 28.4  MCHC 29.9* 30.2 30.7  RDW 16.6* 16.3* 16.5*  PLT 166 168 165   BNP Recent Labs  Lab 07/25/19 1805  BNP 943.8*    DDimer No results for input(s): DDIMER in the last 168 hours.   Radiology/Studies:  Nm Pulmonary Perfusion  Result Date: 07/26/2019 CLINICAL DATA:  Shortness of breath EXAM: NUCLEAR MEDICINE PERFUSION LUNG SCAN TECHNIQUE: Perfusion images were obtained in multiple projections after intravenous injection of radiopharmaceutical. Ventilation scans intentionally deferred  if perfusion scan and chest x-ray adequate for interpretation during COVID 19 epidemic. RADIOPHARMACEUTICALS:  1.6 mCi Tc-83mMAA IV COMPARISON:  Chest radiograph July 25, 2019 FINDINGS: Radiotracer uptake bilaterally is homogeneous and symmetric bilaterally. No perfusion defects are evident. Cardiomegaly noted. IMPRESSION: No perfusion defects evident. Very low probability of pulmonary embolus.  Cardiomegaly noted. Electronically Signed   By: Lowella Grip III M.D.   On: 07/26/2019 11:13   Dg Chest Portable 1 View  Result Date: 07/25/2019 CLINICAL DATA:  Chest pain and shortness of breath EXAM: PORTABLE CHEST 1 VIEW COMPARISON:  July 08, 2019 FINDINGS: Stable cardiomegaly. No pneumothorax. No nodules or masses. The left lung is clear. There is a small effusion on the right with mild opacity in the right base, more prominent the interval. No other changes. IMPRESSION: 1. There is a small right pleural effusion. Right basilar opacity is more prominent the interval and could represent developing infiltrate/pneumonia versus atelectasis. Electronically Signed   By: Dorise Bullion III M.D   On: 07/25/2019 18:36    Assessment and Plan:   Patricia [name] is a 83 y.o. female with a hx of persistent atrial fibrillation, hypertension, obesity, diabetes, hyperlipidemia, anemia, aortic stenosis status post TAVR ('17) and chronic diastolic heart failure who is being seen today for the evaluation of shortness of breath at the request of Dr. Eliseo Squires.  1. Acute respiratory failure with hypoxia: suspect this to be a combination of COPD and HF. Her weight is actually lower than when she was seen in the office back in 2019. She does not appear significantly overloaded on exam, not much UOP noted with lasix. Though she has only received 39m x2 and is on 859mdaily at home. Suspect she would need a higher dose given her renal disease. Symptoms were also responsive to steroids as well. VQ scan was negative. Also in  Afib on admission, could be contributing?  -- check echo  2. Persistent Afib: Rates on admission in the 90s, but appears that she is usually bradycardic when in SR. Question whether this could be playing a role as well. Unfortunately she is not a good candidate for resuming anticoagulation given her recurrent GI bleed, therefore unable to cardiovert. This patients CHA2DS2-VASc Score of at least 6. Remains on amiodarone 2005maily.   3. Chronic diastolic HF: echo [id number] normal EF. Has been on lasix 50m83mily prior to admission. Recommendations above  4. Anemia: with hx of GI bleed. Hgb stable 10.9>>11.2  5. CKD III: Cr 2.59>>2.45 today.   6. CAD: did have a 75% OM lesion on cath back in 2017. Denies any chest pain prior to admission. HsT with low flat trend.   7. AS s/p TAVR: January 2018 showed higher than expected AV valve prosthesis gradient felt to be related to vigorous LV function and valve/patient mismatch.  8. COPD: followed by pulmonology. Recent steroid course.    For questions or updates, please contact [facility] consult [email or web address] for contact info under   MD to see  Signed, LindReino Bellis  07/26/2019 2:53 PM   Patient seen, examined. Available data reviewed. Agree with findings, assessment, and plan as outlined by LindReino Bellis. On my exam: Vitals:   07/26/19 1253 07/26/19 1654  BP: (!) 136/113 (!) 141/114  Pulse: 94 88  Resp: 18 20  Temp: 97.8 F (36.6 C) (!) 97.5 F (36.4 C)  SpO2: 96% 97%   Pt is alert and oriented, obese, elderly woman in NAD HEENT: normal Neck: JVP - normal, carotids 2+= without bruits Lungs: CTA bilaterally CV: RRR without murmur or gallop Abd: soft, NT, Positive BS, no hepatomegaly Ext: no pretibial edema, 1+ bilateral pretibial edema, distal pulses intact and equal Skin: warm/dry no rash  Lab data is pertinent for chronic kidney disease with a creatinine of  2.45 mg/dL, minimally elevated high-sensitivity  troponin at 29-30 (flat low-level trend), and elevated BNP of 944.  She reports increased abdominal swelling with modest responsiveness to oral Lasix but progressive symptoms prompting her hospitalization.  She denies excessive sodium or fluid intake.  She has had no chest pain to suggest acute ischemia.  I think her clinical presentation is consistent with acute on chronic diastolic heart failure in the setting of comorbidities including hypertensive heart disease, morbid obesity, and atrial fibrillation.  As outlined above, her treatment of atrial fibrillation is limited to rate control since the patient cannot be anticoagulated after recurrent GI bleeding.  We will diurese her more aggressively by increasing her furosemide dose to 80 mg IV twice daily.  An echocardiogram has been ordered and this will be reviewed when available.  If she does not respond to IV furosemide, will add metolazone tomorrow morning.  We will follow with you.  Sherren Mocha, M.D. 07/26/2019 6:21 PM

## 2019-07-26 NOTE — Progress Notes (Signed)
  Mobility Specialist Criteria Algorithm Info.  SATURATION QUALIFICATIONS: (This note is used to comply with regulatory documentation for home oxygen)  Patient Saturations on Room Air at Rest = 90%  Patient Saturations on Room Air while Ambulating = 87%  Patient Saturations on 2 Liters of oxygen while Ambulating = 90%  Please briefly explain why patient needs home oxygen: Before ambulation patient maintains an oxygen saturation of 89-90% on RA. During ambulation patient quickly desaturated to 87%; on 2LO2 it took over 2 minutes for oxygen to recover and maintain a saturation <90%.  Mobility:   Activity: Ambulated in hall;Transferred:  Bed to chair (to chair after ambulation) Range of motion: Active;All extremities Level of assistance: Contact guard assist, steadying assist (Held Arms) Assistive device: Other (Comment) (Arm Held; uses RW PRN at home but opted to not use) Minutes stood: 7 minutes Minutes ambulated: 7 minutes Distance ambulated (ft): 240 ft Mobility response: Tolerated well;RN notified (Pt w/LOB x2, contact-min guard to regain balance  ) Bed Position: Chair Mobility Status: Yes, no lift needed  07/26/2019 12:46 PM

## 2019-07-26 NOTE — Progress Notes (Addendum)
RN paged NP because CCMD relayed that pt had ST elevation on tele. Pt is in no distress and has no c/o chest pain. High sensitivity troponins in ED flat (29, 30). will recheck x 1. New EKG comparable to older one on chart.  KJKG, NP [facility] HST 29 as well.  KJKG, NP [facility]

## 2019-07-26 NOTE — Progress Notes (Signed)
Inpatient Diabetes Program Recommendations  AACE/ADA: New Consensus Statement on Inpatient Glycemic Control   Target Ranges:  Prepandial:   less than 140 mg/dL      Peak postprandial:   less than 180 mg/dL (1-2 hours)      Critically ill patients:  140 - 180 mg/dL   Results for Patricia [name], Patricia [name] (MRN [id number]) as of 07/26/2019 09:53  Ref. Range 07/25/2019 22:40 07/26/2019 06:56  Glucose-Capillary Latest Ref Range: 70 - 99 mg/dL 177 (H) 244 (H)   Review of Glycemic Control  Diabetes history: DM2 Outpatient Diabetes medications: Amaryl 2 mg QAM, Januvia 50 mg QAM Current orders for Inpatient glycemic control: Novolog 0-9 units TID with meals; Solumedrol 40 mg daily  Inpatient Diabetes Program Recommendations:   Insulin-Correction: Please consider ordering Novolog 0-5 units QHS for bedtime correction.  Insulin-Meal Coverage: If steroids are continued and post prandial glucose is consistently >180 mg/dl, please consider ordering Novolog 3 units TID with meals for meal coverage if patient eats at least 50% of meals.  Thanks, Barnie Alderman, RN, MSN, CDE Diabetes Coordinator Inpatient Diabetes Program [phone] (Team Pager from 8am to 5pm)

## 2019-07-27 ENCOUNTER — Inpatient Hospital Stay (HOSPITAL_COMMUNITY): Payer: Medicare Other

## 2019-07-27 DIAGNOSIS — J449 Chronic obstructive pulmonary disease, unspecified: Secondary | ICD-10-CM

## 2019-07-27 DIAGNOSIS — I10 Essential (primary) hypertension: Secondary | ICD-10-CM

## 2019-07-27 DIAGNOSIS — I361 Nonrheumatic tricuspid (valve) insufficiency: Secondary | ICD-10-CM

## 2019-07-27 DIAGNOSIS — E039 Hypothyroidism, unspecified: Secondary | ICD-10-CM

## 2019-07-27 DIAGNOSIS — J90 Pleural effusion, not elsewhere classified: Secondary | ICD-10-CM

## 2019-07-27 DIAGNOSIS — E1165 Type 2 diabetes mellitus with hyperglycemia: Secondary | ICD-10-CM

## 2019-07-27 DIAGNOSIS — I340 Nonrheumatic mitral (valve) insufficiency: Secondary | ICD-10-CM

## 2019-07-27 DIAGNOSIS — Z952 Presence of prosthetic heart valve: Secondary | ICD-10-CM

## 2019-07-27 LAB — BASIC METABOLIC PANEL
Anion gap: 15 (ref 5–15)
BUN: 73 mg/dL — ABNORMAL HIGH (ref 8–23)
CO2: 25 mmol/L (ref 22–32)
Calcium: 8.6 mg/dL — ABNORMAL LOW (ref 8.9–10.3)
Chloride: 97 mmol/L — ABNORMAL LOW (ref 98–111)
Creatinine, Ser: 2.48 mg/dL — ABNORMAL HIGH (ref 0.44–1.00)
GFR calc Af Amer: 21 mL/min — ABNORMAL LOW (ref >60)
GFR calc non Af Amer: 18 mL/min — ABNORMAL LOW (ref >60)
Glucose, Bld: 225 mg/dL — ABNORMAL HIGH (ref 70–99)
Potassium: 4.7 mmol/L (ref 3.5–5.1)
Sodium: 137 mmol/L (ref 135–145)

## 2019-07-27 LAB — GLUCOSE, CAPILLARY
Glucose-Capillary: 178 mg/dL — ABNORMAL HIGH (ref 70–99)
Glucose-Capillary: 159 mg/dL — ABNORMAL HIGH (ref 70–99)
Glucose-Capillary: 188 mg/dL — ABNORMAL HIGH (ref 70–99)
Glucose-Capillary: 141 mg/dL — ABNORMAL HIGH (ref 70–99)

## 2019-07-27 LAB — CBC
HCT: 35.2 % — ABNORMAL LOW (ref 36.0–46.0)
Hemoglobin: 10.8 g/dL — ABNORMAL LOW (ref 12.0–15.0)
MCH: 28.4 pg (ref 26.0–34.0)
MCHC: 30.7 g/dL (ref 30.0–36.0)
MCV: 92.6 fL (ref 80.0–100.0)
Platelets: 177 10*3/uL (ref 150–400)
RBC: 3.8 MIL/uL — ABNORMAL LOW (ref 3.87–5.11)
RDW: 16.5 % — ABNORMAL HIGH (ref 11.5–15.5)
WBC: 6.3 10*3/uL (ref 4.0–10.5)
nRBC: 0 % (ref 0.0–0.2)

## 2019-07-27 LAB — ECHOCARDIOGRAM COMPLETE
Height: 63 in
Weight: 3576 oz

## 2019-07-27 MED ORDER — SODIUM CHLORIDE 0.9 % IV SOLN
250.0000 mL | INTRAVENOUS | Status: DC
  Administered 2019-07-27: 250 mL via INTRAVENOUS

## 2019-07-27 MED ORDER — APIXABAN 5 MG PO TABS: 5.0000 mg | ORAL_TABLET | Freq: Two times a day (BID) | ORAL | Status: DC

## 2019-07-27 MED ORDER — METOPROLOL TARTRATE 25 MG PO TABS
25.0000 mg | ORAL_TABLET | Freq: Two times a day (BID) | ORAL | Status: DC
  Administered 2019-07-27: 25 mg via ORAL
  Filled 2019-07-27: qty 1

## 2019-07-27 MED ORDER — INFLUENZA VAC A&B SA ADJ QUAD 0.5 ML IM PRSY
0.5000 mL | PREFILLED_SYRINGE | INTRAMUSCULAR | Status: AC
  Administered 2019-07-29: 0.5 mL via INTRAMUSCULAR
  Filled 2019-07-27 (×2): qty 0.5

## 2019-07-27 MED ORDER — GUAIFENESIN-DM 100-10 MG/5ML PO SYRP
5.0000 mL | ORAL_SOLUTION | ORAL | Status: DC | PRN
  Administered 2019-07-27 – 2019-07-28 (×3): 5 mL via ORAL
  Filled 2019-07-27 (×3): qty 5

## 2019-07-27 MED ORDER — GLIMEPIRIDE 1 MG PO TABS
1.0000 mg | ORAL_TABLET | Freq: Every day | ORAL | Status: DC
  Administered 2019-07-29: 1 mg via ORAL
  Filled 2019-07-27 (×2): qty 1

## 2019-07-27 MED ORDER — SODIUM CHLORIDE 0.9 % IV SOLN
INTRAVENOUS | Status: DC
  Administered 2019-07-28 (×2): via INTRAVENOUS

## 2019-07-27 MED ORDER — APIXABAN 2.5 MG PO TABS
2.5000 mg | ORAL_TABLET | Freq: Two times a day (BID) | ORAL | Status: DC
  Administered 2019-07-27 – 2019-07-29 (×4): 2.5 mg via ORAL
  Filled 2019-07-27 (×4): qty 1

## 2019-07-27 MED ORDER — SODIUM CHLORIDE 0.9% FLUSH: 3.0000 mL | INTRAVENOUS | Status: DC | PRN

## 2019-07-27 MED ORDER — APIXABAN 2.5 MG PO TABS
2.5000 mg | ORAL_TABLET | Freq: Two times a day (BID) | ORAL | Status: DC
  Administered 2019-07-27: 2.5 mg via ORAL
  Filled 2019-07-27: qty 1

## 2019-07-27 NOTE — Progress Notes (Signed)
Patient c/o  Dizziness , Headache, and nausea, patient stated she has been feeling bad a little bit after she took metoprolol, initial V/S was 84/63, HR 87, V/S recheck again see flowsheets for V/S, patient is okay now resting comfortable. NP notified, RR called. Will continue to monitor the patien.

## 2019-07-27 NOTE — H&P (View-Only) (Signed)
Progress Note  Patient Name: Patricia [name] Date of Encounter: 07/27/2019   Primary Cardiologist: [name] Martinique, MD    Subjective   Feeling better today, HR is elevated.   Inpatient Medications    Scheduled Meds: . amiodarone  200 mg Oral Daily  . doxazosin  4 mg Oral Daily  . enoxaparin (LOVENOX) injection  30 mg Subcutaneous Q24H  . ferrous sulfate  325 mg Oral BID  . fluticasone furoate-vilanterol  1 puff Inhalation Daily  . furosemide  80 mg Intravenous BID  . [START ON 07/28/2019] influenza vaccine adjuvanted  0.5 mL Intramuscular Tomorrow-1000  . insulin aspart  0-9 Units Subcutaneous TID WC  . ipratropium-albuterol  3 mL Nebulization BID  . levothyroxine  88 mcg Oral Q0600  . pantoprazole  40 mg Oral BID  . rosuvastatin  10 mg Oral Daily   Continuous Infusions: . sodium chloride 250 mL (07/26/19 0856)   PRN Meds: sodium chloride, acetaminophen **OR** acetaminophen, albuterol, guaiFENesin-dextromethorphan, ondansetron **OR** ondansetron (ZOFRAN) IV   Vital Signs    Vitals:   07/27/19 0424 07/27/19 0803 07/27/19 0839 07/27/19 0844  BP: (!) 115/59 (!) 134/92    Pulse: 79 98 91   Resp: _0 Temp: 97.9 F (36.6 C) 98.4 F (36.9 C)    TempSrc: Oral Oral    SpO2: 98% 98% 97% 97%  Weight:      Height:        Intake/Output Summary (Last 24 hours) at 07/27/2019 0910 Last data filed at 07/27/2019 0900 Gross per 24 hour  Intake 960.42 ml  Output 1750 ml  Net -789.58 ml   Last 3 Weights 07/27/2019 07/26/2019 07/25/2019  Weight (lbs) 223 lb 8 oz 221 lb 6.4 oz 222 lb 12.8 oz  Weight (kg) 101.379 kg 100.426 kg 101.061 kg      Telemetry    Afib 110s-120s - Personally Reviewed  ECG    No new tracing - Personally Reviewed  Physical Exam  Obese older WF, sitting up on the side of the bed. Wearing [place].  GEN: No acute distress.   Neck: No JVD Cardiac: Tachycardic, no murmurs, rubs, or gallops.  Respiratory: Clear to auscultation bilaterally. GI: Soft,  nontender, non-distended  MS: No edema; No deformity. Neuro:  Nonfocal  Psych: Normal affect   Labs    High Sensitivity Troponin:   Recent Labs  Lab 07/25/19 1805 07/25/19 2018 07/26/19 0431  TROPONINIHS 29* 30* 29*      Chemistry Recent Labs  Lab 07/25/19 1805 07/25/19 2238 07/26/19 0431 07/27/19 0409  NA 137  --  138 137  K 4.3  --  4.9 4.7  CL 98  --  99 97*  CO2 23  --  26 25  GLUCOSE 171*  --  259* 225*  BUN 62*  --  62* 73*  CREATININE 2.59* 2.54* 2.45* 2.48*  CALCIUM 9.0  --  8.9 8.6*  GFRNONAA 17* 17* 18* 18*  GFRAA 19* 20* 21* 21*  ANIONGAP 16*  --  13 15     Hematology Recent Labs  Lab 07/25/19 2238 07/26/19 0431 07/27/19 0409  WBC 6.4 6.2 6.3  RBC 3.88 3.95 3.80*  HGB 10.9* 11.2* 10.8*  HCT 36.1 36.5 35.2*  MCV 93.0 92.4 92.6  MCH 28.1 28.4 28.4  MCHC 30.2 30.7 30.7  RDW 16.3* 16.5* 16.5*  PLT 168 165 177    BNP Recent Labs  Lab 07/25/19 1805  BNP 943.8*     DDimer  No results for input(s): DDIMER in the last 168 hours.   Radiology    Nm Pulmonary Perfusion  Result Date: 07/26/2019 CLINICAL DATA:  Shortness of breath EXAM: NUCLEAR MEDICINE PERFUSION LUNG SCAN TECHNIQUE: Perfusion images were obtained in multiple projections after intravenous injection of radiopharmaceutical. Ventilation scans intentionally deferred if perfusion scan and chest x-ray adequate for interpretation during COVID 19 epidemic. RADIOPHARMACEUTICALS:  1.6 mCi Tc-34mMAA IV COMPARISON:  Chest radiograph July 25, 2019 FINDINGS: Radiotracer uptake bilaterally is homogeneous and symmetric bilaterally. No perfusion defects are evident. Cardiomegaly noted. IMPRESSION: No perfusion defects evident. Very low probability of pulmonary embolus. Cardiomegaly noted. Electronically Signed   By: WLowella GripIII M.D.   On: 07/26/2019 11:13   Dg Chest Portable 1 View  Result Date: 07/25/2019 CLINICAL DATA:  Chest pain and shortness of breath EXAM: PORTABLE CHEST 1 VIEW  COMPARISON:  July 08, 2019 FINDINGS: Stable cardiomegaly. No pneumothorax. No nodules or masses. The left lung is clear. There is a small effusion on the right with mild opacity in the right base, more prominent the interval. No other changes. IMPRESSION: 1. There is a small right pleural effusion. Right basilar opacity is more prominent the interval and could represent developing infiltrate/pneumonia versus atelectasis. Electronically Signed   By: DDorise BullionIII M.D   On: 07/25/2019 18:36    Cardiac Studies   TTE: pending   Patient Profile     83y.o. female with a hx of persistent atrial fibrillation, hypertension, obesity, diabetes, hyperlipidemia, anemia, aortic stenosis status post TAVR ('17) and chronic diastolic heart failure who was seen for the evaluation of shortness of breath.  Assessment & Plan    1. Acute respiratory failure with hypoxia: suspect this to be a combination of COPD and HF. Weight is up 2lbs this morning, but UOP 1750cc yesterday. Due to receive 874mIV lasix today. Follow response.  -- strict I&O, daily weights -- echo pending   2. Persistent Afib: Rates are further elevated this morning in the 110s. Suspect she will not tolerate this with her shortness of breath.  Unfortunately she is not a good candidate for resuming anticoagulation given her recurrent GI bleed, therefore unable to cardiovert. This patients CHA2DS2-VASc Score of at least 6.  -- Remains on amiodarone 20055maily.  -- add metoprolol 10m4mD  3. Chronic diastolic HF: echo [id number] normal EF. Has been on lasix 80mg36mly prior to admission. Recommendations above -- echo pending  4. Anemia: with hx of GI bleed. Hgb stable 10.9>>11.2  5. CKD III: Cr 2.59>>2.45 today.   6. CAD: did have a 75% OM lesion on cath back in 2017. Denies any chest pain prior to admission. HsT with low flat trend.   7. AS s/p TAVR: January 2018 showed higher than expected AV valve prosthesis gradient  felt to be related to vigorous LV function and valve/patient mismatch.  8. COPD: followed by pulmonology. Recent steroid course.    For questions or updates, please contact [facility] consult [email or web address] for contact info under        Signed, LindsReino Bellis 07/27/2019, 9:10 AM    Patient seen, examined. Available data reviewed. Agree with findings, assessment, and plan as outlined by LindsReino Bellis  On my exam the patient is alert, oriented, in no distress.  She is now on room air.  JVP is difficult to visualize, lung fields with prolonged expiratory phase and diminished breath sounds bilaterally.  Heart is irregularly irregular  with a 2/6 systolic murmur at the left lower sternal border, abdomen is soft obese and nontender, extremities with trace edema.  The patient has had a modest diuresis with furosemide 80 mg IV twice daily.  She will have a PA and lateral chest x-ray to evaluate pleural effusion today.  An echocardiogram has been done but the formal interpretation is currently pending.  I have preliminarily reviewed the images which show marked LV dyssynchrony and at least moderate LV systolic dysfunction.  I suspect this is tachycardia mediated and also related to her wide left bundle branch block.  I think it is going to be difficult to treat her heart failure with a strategy of rate control for atrial fibrillation.  Complicating matters is her history of severe anemia with slow GI bleeding.  One consideration would be TEE guided cardioversion with plans for 30 days of oral anticoagulation with close hemoglobin monitoring.  Another consideration would be AV node ablation and CRT pacing.  Will discuss options with EP colleagues.  In the meantime will try to slow her rate with metoprolol and continue current IV diuretic dosing.  I have placed a call to Dr. Therisa Doyne with [facility] gastroenterology to discuss risk/benefit of 30 days of anticoagulation with apixaban.  Sherren Mocha, M.D. 07/27/2019 1:02 PM  ADDENDUM: Spoke with Dr Paulita Fujita with GI and Dr Lovena Le with EP. Favor TEE-cardioversion with plans for short term anticoagulation with apixaban. Discussed plan with patient. She understands this is a difficult balance of bleeding risk and need to treat atrial fibrillation in the setting of new cardiac dysfunction and heart failure. Reviewed risks/indications of TEE-cardioversion with her - she understands and agrees to proceed. Will start apixaban today with plans for 2 doses today and another dose tomorrow am.  Sherren Mocha 07/27/2019 3:29 PM

## 2019-07-27 NOTE — Progress Notes (Addendum)
PROGRESS NOTE    Patricia [name]  TKP:[id number]  DOB: 1936/01/19  DOA: 07/25/2019 PCP: [name], NP  Brief Narrative:  83 y.o. female withhistory of diastolic CHF last EF measured in December 2018 was 24 to 51% with diastolic dysfunction, status post TAVR, COPD, A. Fib off canticoagulation due to recurrent GI bleeds, anemia, chronic kidney disease stage III, diabetes mellitus presents to the ER because of worsening shortness of breath for last few weeks. About 2 weeks ago patient was treated with steroids by her pulmonologist for bronchitis. CXR on admission revealed right sided pleural effusion with possible underlying pneumonia vs atelectasis. Antibiotics discontinued after procalcitonin resulted wnl.  Patient follows Dr. Burt Knack as outpatient and has been evaluated by cardiology while here.  Subjective: Patient sitting comfortably on bedside chair.  Noted to be on 2 L nasal cannula O2.  She denies using O2 at home.  She is anxious to go home and states can take Lasix at home  Objective: Vitals:   07/27/19 0424 07/27/19 0803 07/27/19 0839 07/27/19 0844  BP: (!) 115/59 (!) 134/92    Pulse: 79 98 91   Resp: _0 Temp: 97.9 F (36.6 C) 98.4 F (36.9 C)    TempSrc: Oral Oral    SpO2: 98% 98% 97% 97%  Weight:      Height:        Intake/Output Summary (Last 24 hours) at 07/27/2019 0854 Last data filed at 07/27/2019 0421 Gross per 24 hour  Intake 960.42 ml  Output 1750 ml  Net -789.58 ml   Filed Weights   07/25/19 1751 07/26/19 0452 07/27/19 0003  Weight: 101.1 kg 100.4 kg 101.4 kg    Physical Examination:  General exam: Appears calm and comfortable  Respiratory system: Clear to auscultation with no wheezing but decreased breath sounds at bases right greater than left. Respiratory effort normal. Cardiovascular system: S1 & S2 heard, irregular, tachycardic.  1+ pitting pedal edema. Gastrointestinal system: Abdomen is nondistended, soft and nontender. No  organomegaly or masses felt. Normal bowel sounds heard. Central nervous system: Alert and oriented. No focal neurological deficits. Extremities: Symmetric 5 x 5 power.  1+ leg edema Skin: No rashes, lesions or ulcers Psychiatry: Judgement and insight appear normal. Mood & affect anxious   Data Reviewed: I have personally reviewed following labs and imaging studies  CBC: Recent Labs  Lab 07/25/19 1805 07/25/19 2238 07/26/19 0431 07/27/19 0409  WBC 6.2 6.4 6.2 6.3  NEUTROABS 5.0  --   --   --   HGB 10.9* 10.9* 11.2* 10.8*  HCT 36.4 36.1 36.5 35.2*  MCV 94.8 93.0 92.4 92.6  PLT 166 168 165 700   Basic Metabolic Panel: Recent Labs  Lab 07/25/19 1805 07/25/19 2238 07/26/19 0431 07/27/19 0409  NA 137  --  138 137  K 4.3  --  4.9 4.7  CL 98  --  99 97*  CO2 23  --  26 25  GLUCOSE 171*  --  259* 225*  BUN 62*  --  62* 73*  CREATININE 2.59* 2.54* 2.45* 2.48*  CALCIUM 9.0  --  8.9 8.6*  MG  --   --  2.3  --    GFR: Estimated Creatinine Clearance: 20.6 mL/min (A) (by C-G formula based on SCr of 2.48 mg/dL (H)). Liver Function Tests: No results for input(s): AST, ALT, ALKPHOS, BILITOT, PROT, ALBUMIN in the last 168 hours. No results for input(s): LIPASE, AMYLASE in the last 168 hours. No  results for input(s): AMMONIA in the last 168 hours. Coagulation Profile: No results for input(s): INR, PROTIME in the last 168 hours. Cardiac Enzymes: No results for input(s): CKTOTAL, CKMB, CKMBINDEX, TROPONINI in the last 168 hours. BNP (last 3 results) Recent Labs    08/12/18 1007 07/08/19 1139 07/15/19 1222  PROBNP 312.0* 1,184.0* 835.0*   HbA1C: Recent Labs    07/25/19 2148  HGBA1C 8.0*   CBG: Recent Labs  Lab 07/26/19 0656 07/26/19 1250 07/26/19 1650 07/26/19 2122 07/27/19 0647  GLUCAP 244* 239* 266* 224* 178*   Lipid Profile: No results for input(s): CHOL, HDL, LDLCALC, TRIG, CHOLHDL, LDLDIRECT in the last 72 hours. Thyroid Function Tests: Recent Labs     07/26/19 0431  TSH 1.749   Anemia Panel: No results for input(s): VITAMINB12, FOLATE, FERRITIN, TIBC, IRON, RETICCTPCT in the last 72 hours. Sepsis Labs: Recent Labs  Lab 07/26/19 0431  PROCALCITON <0.10    Recent Results (from the past 240 hour(s))  SARS Coronavirus 2 [facility] order, Performed in [facility] hospital lab) Nasopharyngeal Nasopharyngeal Swab     Status: None   Collection Time: 07/25/19  6:24 PM   Specimen: Nasopharyngeal Swab  Result Value Ref Range Status   SARS Coronavirus 2 NEGATIVE NEGATIVE Final    Comment: (NOTE) If result is NEGATIVE SARS-CoV-2 target nucleic acids are NOT DETECTED. The SARS-CoV-2 RNA is generally detectable in upper and lower  respiratory specimens during the acute phase of infection. The lowest  concentration of SARS-CoV-2 viral copies this assay can detect is 250  copies / mL. A negative result does not preclude SARS-CoV-2 infection  and should not be used as the sole basis for treatment or other  patient management decisions.  A negative result may occur with  improper specimen collection / handling, submission of specimen other  than nasopharyngeal swab, presence of viral mutation(s) within the  areas targeted by this assay, and inadequate number of viral copies  (<250 copies / mL). A negative result must be combined with clinical  observations, patient history, and epidemiological information. If result is POSITIVE SARS-CoV-2 target nucleic acids are DETECTED. The SARS-CoV-2 RNA is generally detectable in upper and lower  respiratory specimens dur ing the acute phase of infection.  Positive  results are indicative of active infection with SARS-CoV-2.  Clinical  correlation with patient history and other diagnostic information is  necessary to determine patient infection status.  Positive results do  not rule out bacterial infection or co-infection with other viruses. If result is PRESUMPTIVE POSTIVE SARS-CoV-2 nucleic acids MAY  BE PRESENT.   A presumptive positive result was obtained on the submitted specimen  and confirmed on repeat testing.  While 2019 novel coronavirus  (SARS-CoV-2) nucleic acids may be present in the submitted sample  additional confirmatory testing may be necessary for epidemiological  and / or clinical management purposes  to differentiate between  SARS-CoV-2 and other Sarbecovirus currently known to infect humans.  If clinically indicated additional testing with an alternate test  methodology [phone]) is advised. The SARS-CoV-2 RNA is generally  detectable in upper and lower respiratory sp ecimens during the acute  phase of infection. The expected result is Negative. Fact Sheet for Patients:  StrictlyIdeas.no Fact Sheet for Healthcare Providers: BankingDealers.co.za This test is not yet approved or cleared by the [place] FDA and has been authorized for detection and/or diagnosis of SARS-CoV-2 by FDA under an Emergency Use Authorization (EUA).  This EUA will remain in effect (meaning this test can be  used) for the duration of the COVID-19 declaration under Section 564(b)(1) of the Act, 21 U.S.C. section 360bbb-3(b)(1), unless the authorization is terminated or revoked sooner. Performed at [facility], [place] [address], [place], [place] [address]       Radiology Studies: Nm Pulmonary Perfusion  Result Date: 07/26/2019 CLINICAL DATA:  Shortness of breath EXAM: NUCLEAR MEDICINE PERFUSION LUNG SCAN TECHNIQUE: Perfusion images were obtained in multiple projections after intravenous injection of radiopharmaceutical. Ventilation scans intentionally deferred if perfusion scan and chest x-ray adequate for interpretation during COVID 19 epidemic. RADIOPHARMACEUTICALS:  1.6 mCi Tc-18mMAA IV COMPARISON:  Chest radiograph July 25, 2019 FINDINGS: Radiotracer uptake bilaterally is homogeneous and symmetric bilaterally. No perfusion  defects are evident. Cardiomegaly noted. IMPRESSION: No perfusion defects evident. Very low probability of pulmonary embolus. Cardiomegaly noted. Electronically Signed   By: WLowella GripIII M.D.   On: 07/26/2019 11:13   Dg Chest Portable 1 View  Result Date: 07/25/2019 CLINICAL DATA:  Chest pain and shortness of breath EXAM: PORTABLE CHEST 1 VIEW COMPARISON:  July 08, 2019 FINDINGS: Stable cardiomegaly. No pneumothorax. No nodules or masses. The left lung is clear. There is a small effusion on the right with mild opacity in the right base, more prominent the interval. No other changes. IMPRESSION: 1. There is a small right pleural effusion. Right basilar opacity is more prominent the interval and could represent developing infiltrate/pneumonia versus atelectasis. Electronically Signed   By: DDorise BullionIII M.D   On: 07/25/2019 18:36        Scheduled Meds: . amiodarone  200 mg Oral Daily  . doxazosin  4 mg Oral Daily  . enoxaparin (LOVENOX) injection  30 mg Subcutaneous Q24H  . ferrous sulfate  325 mg Oral BID  . fluticasone furoate-vilanterol  1 puff Inhalation Daily  . furosemide  80 mg Intravenous BID  . [START ON 07/28/2019] influenza vaccine adjuvanted  0.5 mL Intramuscular Tomorrow-1000  . insulin aspart  0-9 Units Subcutaneous TID WC  . ipratropium-albuterol  3 mL Nebulization BID  . levothyroxine  88 mcg Oral Q0600  . pantoprazole  40 mg Oral BID  . rosuvastatin  10 mg Oral Daily   Continuous Infusions: . sodium chloride 250 mL (07/26/19 0856)    Assessment & Plan:    1.  Acute hypoxic respiratory failure: Secondary to acute exacerbation of diastolic CHF/pleural effusion with underlying COPD.  Chest x-ray on admission suggestive of right pleural effusion.  Seen by cardiology who feels it might be a combination of COPD and CHF but patient currently not on steroids.  She did receive prednisone course 2 weeks back for bronchitis.  No wheezing currently.  BNP 943 on  presentation.  Still requiring 2 L O2 via nasal cannula-Taper to off as tolerated.  Discussed with bedside nurse.  Will repeat chest x-ray to follow-up on pleural effusion--May need thoracentesis if not responding to IV diuretics.  VQ scan on 9/29 with low probability for PE  2.  Acute on chronic diastolic CHF: Appreciate cardiology evaluation and follow-up.  IV diuretics being adjusted by cardiology (watch blood pressure).  Patient takes Lasix 80 mg p.o. daily at home (currently on 80 mg IV twice daily).  Monitor daily weights, input/outputs.  Follow-up repeat echo results.  3.  A. fib with RVR: Heart rate in the 90s to 110s which could be contributing to problem #2.  On amiodarone.  Cardiology added beta-blockers but patient did not tolerate with drop in blood pressure and nausea.  Beta-blockers held and cardiology to follow-up in a.m.  Not on anticoagulation due to history of recurrent GI bleeds  4.  Chronic anemia with history of recurrent GI bleeds: Hemoglobin stable around 10.5-11.5.  Continue to monitor.  5.  Acute on chronic kidney disease stage III: Patient's baseline creatinine appears to have been around 1.6 in 2019/early 2020.  Serum creatinine at 2.3 on 07/08/2019.  Was elevated at 2.59 on this admission.  Tolerating diuretics well and creatinine at 2.4 today.  Holding Cozaar due to hypotension as well as AKI  6.  COPD: Recently completed steroid treatment for bronchitis/COPD flare.  Received 125 mg IV Solu-Medrol on presentation.  Currently not on steroids.  No wheezing on exam.  Taper O2 as tolerated.  Patient states she underwent walking desat studies in PCP office sometime last year and did not qualify for home O2.  Hopefully can be weaned off O2 prior to discharge  7.  Diabetes mellitus: Continue sliding scale insulin.  On Amaryl and Januvia at home.  Resume Amaryl at lower dose as blood glucose up to 266 yesterday-watch for hypoglycemia in the setting of AKI  8.  AS s/p TAVR:January  2018 showed higher than expected AV valve prosthesis gradient felt to be related to vigorous LV function and valve/patient mismatch  9. Hyperlipidemia: Statins  10. Hypothyroidism: Synthroid  DVT prophylaxis: On Lovenox, monitor hemoglobin due to history of recurrent GI bleeds. Code Status: Full code Family / Patient Communication: Discussed with patient Disposition Plan: Home when medically cleared, hopefully next 24 to 48 hours     LOS: 1 day    Time spent: 35 minutes    Guilford Shi, MD [facility] Pager [phone]  If 7PM-7AM, please contact night-coverage [email or web address] Password [email or web address] 07/27/2019, 8:54 AM

## 2019-07-27 NOTE — Progress Notes (Signed)
  Echocardiogram 2D Echocardiogram has been performed.  Maricella Filyaw G Doss Cybulski 07/27/2019, 11:41 AM

## 2019-07-27 NOTE — Significant Event (Signed)
Rapid Response Event Note  Overview:      Initial Focused Assessment: Per RN pt received  74m lopressor about 12:30 this afternoon.  This was a new medication.  Patient complained of being nausea this afternoon and while oob she was also dizzy.  She also complained of a throbbing headache and neck pain.   Much improved with tylenol.  BP 84/63  HR 87  A few minutes after lying down BP 99/57  HR 76  And 112/62  HR 97  Zofran given for nausea.  RN spoke with cardiology, plan hold lopressor this evening.  Interventions:  Plan of Care (if not transferred):  Event Summary:   at      at          [facility]

## 2019-07-27 NOTE — Progress Notes (Addendum)
Progress Note  Patient Name: Patricia [name] Date of Encounter: 07/27/2019   Primary Cardiologist: [name] Martinique, MD    Subjective   Feeling better today, HR is elevated.   Inpatient Medications    Scheduled Meds: . amiodarone  200 mg Oral Daily  . doxazosin  4 mg Oral Daily  . enoxaparin (LOVENOX) injection  30 mg Subcutaneous Q24H  . ferrous sulfate  325 mg Oral BID  . fluticasone furoate-vilanterol  1 puff Inhalation Daily  . furosemide  80 mg Intravenous BID  . [START ON 07/28/2019] influenza vaccine adjuvanted  0.5 mL Intramuscular Tomorrow-1000  . insulin aspart  0-9 Units Subcutaneous TID WC  . ipratropium-albuterol  3 mL Nebulization BID  . levothyroxine  88 mcg Oral Q0600  . pantoprazole  40 mg Oral BID  . rosuvastatin  10 mg Oral Daily   Continuous Infusions: . sodium chloride 250 mL (07/26/19 0856)   PRN Meds: sodium chloride, acetaminophen **OR** acetaminophen, albuterol, guaiFENesin-dextromethorphan, ondansetron **OR** ondansetron (ZOFRAN) IV   Vital Signs    Vitals:   07/27/19 0424 07/27/19 0803 07/27/19 0839 07/27/19 0844  BP: (!) 115/59 (!) 134/92    Pulse: 79 98 91   Resp: _0 Temp: 97.9 F (36.6 C) 98.4 F (36.9 C)    TempSrc: Oral Oral    SpO2: 98% 98% 97% 97%  Weight:      Height:        Intake/Output Summary (Last 24 hours) at 07/27/2019 0910 Last data filed at 07/27/2019 0900 Gross per 24 hour  Intake 960.42 ml  Output 1750 ml  Net -789.58 ml   Last 3 Weights 07/27/2019 07/26/2019 07/25/2019  Weight (lbs) 223 lb 8 oz 221 lb 6.4 oz 222 lb 12.8 oz  Weight (kg) 101.379 kg 100.426 kg 101.061 kg      Telemetry    Afib 110s-120s - Personally Reviewed  ECG    No new tracing - Personally Reviewed  Physical Exam  Obese older WF, sitting up on the side of the bed. Wearing .  GEN: No acute distress.   Neck: No JVD Cardiac: Tachycardic, no murmurs, rubs, or gallops.  Respiratory: Clear to auscultation bilaterally. GI: Soft,  nontender, non-distended  MS: No edema; No deformity. Neuro:  Nonfocal  Psych: Normal affect   Labs    High Sensitivity Troponin:   Recent Labs  Lab 07/25/19 1805 07/25/19 2018 07/26/19 0431  TROPONINIHS 29* 30* 29*      Chemistry Recent Labs  Lab 07/25/19 1805 07/25/19 2238 07/26/19 0431 07/27/19 0409  NA 137  --  138 137  K 4.3  --  4.9 4.7  CL 98  --  99 97*  CO2 23  --  26 25  GLUCOSE 171*  --  259* 225*  BUN 62*  --  62* 73*  CREATININE 2.59* 2.54* 2.45* 2.48*  CALCIUM 9.0  --  8.9 8.6*  GFRNONAA 17* 17* 18* 18*  GFRAA 19* 20* 21* 21*  ANIONGAP 16*  --  13 15     Hematology Recent Labs  Lab 07/25/19 2238 07/26/19 0431 07/27/19 0409  WBC 6.4 6.2 6.3  RBC 3.88 3.95 3.80*  HGB 10.9* 11.2* 10.8*  HCT 36.1 36.5 35.2*  MCV 93.0 92.4 92.6  MCH 28.1 28.4 28.4  MCHC 30.2 30.7 30.7  RDW 16.3* 16.5* 16.5*  PLT 168 165 177    BNP Recent Labs  Lab 07/25/19 1805  BNP 943.8*     DDimer  No results for input(s): DDIMER in the last 168 hours.   Radiology    Nm Pulmonary Perfusion  Result Date: 07/26/2019 CLINICAL DATA:  Shortness of breath EXAM: NUCLEAR MEDICINE PERFUSION LUNG SCAN TECHNIQUE: Perfusion images were obtained in multiple projections after intravenous injection of radiopharmaceutical. Ventilation scans intentionally deferred if perfusion scan and chest x-ray adequate for interpretation during COVID 19 epidemic. RADIOPHARMACEUTICALS:  1.6 mCi Tc-99m MAA IV COMPARISON:  Chest radiograph July 25, 2019 FINDINGS: Radiotracer uptake bilaterally is homogeneous and symmetric bilaterally. No perfusion defects are evident. Cardiomegaly noted. IMPRESSION: No perfusion defects evident. Very low probability of pulmonary embolus. Cardiomegaly noted. Electronically Signed   By: William  Woodruff III M.D.   On: 07/26/2019 11:13   Dg Chest Portable 1 View  Result Date: 07/25/2019 CLINICAL DATA:  Chest pain and shortness of breath EXAM: PORTABLE CHEST 1 VIEW  COMPARISON:  July 08, 2019 FINDINGS: Stable cardiomegaly. No pneumothorax. No nodules or masses. The left lung is clear. There is a small effusion on the right with mild opacity in the right base, more prominent the interval. No other changes. IMPRESSION: 1. There is a small right pleural effusion. Right basilar opacity is more prominent the interval and could represent developing infiltrate/pneumonia versus atelectasis. Electronically Signed   By: David  Williams III M.D   On: 07/25/2019 18:36    Cardiac Studies   TTE: pending   Patient Profile     82 y.o. female with a hx of persistent atrial fibrillation, hypertension, obesity, diabetes, hyperlipidemia, anemia, aortic stenosis status post TAVR ('17) and chronic diastolic heart failure who was seen for the evaluation of shortness of breath.  Assessment & Plan    1. Acute respiratory failure with hypoxia: suspect this to be a combination of COPD and HF. Weight is up 2lbs this morning, but UOP 1750cc yesterday. Due to receive 80mg IV lasix today. Follow response.  -- strict I&O, daily weights -- echo pending   2. Persistent Afib: Rates are further elevated this morning in the 110s. Suspect she will not tolerate this with her shortness of breath.  Unfortunately she is not a good candidate for resuming anticoagulation given her recurrent GI bleed, therefore unable to cardiovert. This patients CHA2DS2-VASc Score of at least 6.  -- Remains on amiodarone 200mg daily.  -- add metoprolol 25mg BID  3. Chronic diastolic HF: echo 2018 showed normal EF. Has been on lasix 80mg daily prior to admission. Recommendations above -- echo pending  4. Anemia: with hx of GI bleed. Hgb stable 10.9>>11.2  5. CKD III: Cr 2.59>>2.45 today.   6. CAD: did have a 75% OM lesion on cath back in 2017. Denies any chest pain prior to admission. HsT with low flat trend.   7. AS s/p TAVR: January 2018 showed higher than expected AV valve prosthesis gradient  felt to be related to vigorous LV function and valve/patient mismatch.  8. COPD: followed by pulmonology. Recent steroid course.    For questions or updates, please contact [facility] Please consult [email or web address] for contact info under        Signed, Lindsay Roberts, NP  07/27/2019, 9:10 AM    Patient seen, examined. Available data reviewed. Agree with findings, assessment, and plan as outlined by Lindsay Roberts, NP.  On my exam the patient is alert, oriented, in no distress.  She is now on room air.  JVP is difficult to visualize, lung fields with prolonged expiratory phase and diminished breath sounds bilaterally.  Heart is irregularly irregular   with a 2/6 systolic murmur at the left lower sternal border, abdomen is soft obese and nontender, extremities with trace edema.  The patient has had a modest diuresis with furosemide 80 mg IV twice daily.  She will have a PA and lateral chest x-ray to evaluate pleural effusion today.  An echocardiogram has been done but the formal interpretation is currently pending.  I have preliminarily reviewed the images which show marked LV dyssynchrony and at least moderate LV systolic dysfunction.  I suspect this is tachycardia mediated and also related to her wide left bundle branch block.  I think it is going to be difficult to treat her heart failure with a strategy of rate control for atrial fibrillation.  Complicating matters is her history of severe anemia with slow GI bleeding.  One consideration would be TEE guided cardioversion with plans for 30 days of oral anticoagulation with close hemoglobin monitoring.  Another consideration would be AV node ablation and CRT pacing.  Will discuss options with EP colleagues.  In the meantime will try to slow her rate with metoprolol and continue current IV diuretic dosing.  I have placed a call to Dr. Therisa Doyne with [facility] gastroenterology to discuss risk/benefit of 30 days of anticoagulation with apixaban.  Sherren Mocha, M.D. 07/27/2019 1:02 PM  ADDENDUM: Spoke with Dr Paulita Fujita with GI and Dr Lovena Le with EP. Favor TEE-cardioversion with plans for short term anticoagulation with apixaban. Discussed plan with patient. She understands this is a difficult balance of bleeding risk and need to treat atrial fibrillation in the setting of new cardiac dysfunction and heart failure. Reviewed risks/indications of TEE-cardioversion with her - she understands and agrees to proceed. Will start apixaban today with plans for 2 doses today and another dose tomorrow am.  Sherren Mocha 07/27/2019 3:29 PM

## 2019-07-28 ENCOUNTER — Inpatient Hospital Stay (HOSPITAL_COMMUNITY): Payer: Medicare Other

## 2019-07-28 ENCOUNTER — Encounter (HOSPITAL_COMMUNITY): Payer: Self-pay | Admitting: Emergency Medicine

## 2019-07-28 ENCOUNTER — Inpatient Hospital Stay (HOSPITAL_COMMUNITY): Payer: Medicare Other | Admitting: Anesthesiology

## 2019-07-28 ENCOUNTER — Encounter (HOSPITAL_COMMUNITY)
Admission: EM | Disposition: A | Discharge status: Home / Self Care | Payer: Self-pay | Source: Ambulatory Visit | Attending: Internal Medicine

## 2019-07-28 DIAGNOSIS — I340 Nonrheumatic mitral (valve) insufficiency: Secondary | ICD-10-CM

## 2019-07-28 DIAGNOSIS — I480 Paroxysmal atrial fibrillation: Secondary | ICD-10-CM

## 2019-07-28 DIAGNOSIS — I4891 Unspecified atrial fibrillation: Secondary | ICD-10-CM

## 2019-07-28 HISTORY — PX: CARDIOVERSION: SHX1299

## 2019-07-28 HISTORY — PX: TEE WITHOUT CARDIOVERSION: SHX5443

## 2019-07-28 LAB — BASIC METABOLIC PANEL
Anion gap: 17 — ABNORMAL HIGH (ref 5–15)
BUN: 93 mg/dL — ABNORMAL HIGH (ref 8–23)
CO2: 19 mmol/L — ABNORMAL LOW (ref 22–32)
Calcium: 8.1 mg/dL — ABNORMAL LOW (ref 8.9–10.3)
Chloride: 101 mmol/L (ref 98–111)
Creatinine, Ser: 3.11 mg/dL — ABNORMAL HIGH (ref 0.44–1.00)
GFR calc Af Amer: 16 mL/min — ABNORMAL LOW (ref >60)
GFR calc non Af Amer: 13 mL/min — ABNORMAL LOW (ref >60)
Glucose, Bld: 147 mg/dL — ABNORMAL HIGH (ref 70–99)
Potassium: 4.8 mmol/L (ref 3.5–5.1)
Sodium: 137 mmol/L (ref 135–145)

## 2019-07-28 LAB — GLUCOSE, CAPILLARY
Glucose-Capillary: 140 mg/dL — ABNORMAL HIGH (ref 70–99)
Glucose-Capillary: 212 mg/dL — ABNORMAL HIGH (ref 70–99)

## 2019-07-28 SURGERY — ECHOCARDIOGRAM, TRANSESOPHAGEAL
Anesthesia: Monitor Anesthesia Care

## 2019-07-28 MED ORDER — SODIUM CHLORIDE 0.9 % IV SOLN
INTRAVENOUS | Status: DC | PRN
  Administered 2019-07-28: 50 ug/min via INTRAVENOUS

## 2019-07-28 MED ORDER — SODIUM CHLORIDE 0.9% FLUSH
3.0000 mL | Freq: Two times a day (BID) | INTRAVENOUS | Status: DC
  Administered 2019-07-28 – 2019-07-29 (×3): 3 mL via INTRAVENOUS

## 2019-07-28 MED ORDER — DEXMEDETOMIDINE HCL 200 MCG/2ML IV SOLN
INTRAVENOUS | Status: DC | PRN
  Administered 2019-07-28: 8 ug via INTRAVENOUS

## 2019-07-28 MED ORDER — PROPOFOL 500 MG/50ML IV EMUL
INTRAVENOUS | Status: DC | PRN
  Administered 2019-07-28: 75 ug/kg/min via INTRAVENOUS

## 2019-07-28 MED ORDER — PHENYLEPHRINE 40 MCG/ML (10ML) SYRINGE FOR IV PUSH (FOR BLOOD PRESSURE SUPPORT)
PREFILLED_SYRINGE | INTRAVENOUS | Status: DC | PRN
  Administered 2019-07-28 (×2): 80 ug via INTRAVENOUS

## 2019-07-28 NOTE — Progress Notes (Addendum)
PROGRESS NOTE  Patricia [name] WCB:[id number] DOB: [birth year] DOA: 07/25/2019 PCP: [name], NP  Brief History   83 y.o. female with history of diastolic CHF last EF measured in December 2018 was 52 to 61% with diastolic dysfunction, status post TAVR, COPD, A. Fib off canticoagulation due to recurrent GI bleeds, anemia, chronic kidney disease stage III, diabetes mellitus presents to the ER because of worsening shortness of breath for last few weeks. About 2 weeks ago patient was treated with steroids by her pulmonologist for bronchitis. CXR on admission revealed right sided pleural effusion with possible underlying pneumonia vs atelectasis. Antibiotics discontinued after procalcitonin resulted wnl.  Patient follows Dr. Burt Knack as outpatient and has been evaluated by cardiology while here.  Consultants  . Cardiology  Procedures  . TEE with Cardioversion  Antibiotics   . Anti-infectives (From admission, onward)   .  Marland Kitchen Start .   .   Zada Girt . Route . Frequency . Ordered . Stop  .  . 07/26/19 0600 .  Marland Kitchen doxycycline (VIBRAMYCIN) 100 mg in sodium chloride 0.9 % 250 mL IVPB  Status:  Discontinued   .   Marland Kitchen 100 mg . 125 mL/hr over 120 Minutes . Intravenous . Every 12 hours . 07/26/19 6073 . 07/26/19 7106   .   Marland Kitchen  Subjective  The patient is seen on her way to TEE. She is sitting on the side of the bed. No new complaints.  Objective   Vitals:  Vitals:   07/28/19 1245 07/28/19 1250  BP: (!) 110/39 (!) 113/41  Pulse: (!) 51 (!) 52  Resp: 11 16  Temp:    SpO2: 99% 99%    Exam:  Exam:  Constitutional:  . The patient is awake, alert, and oriented x 3. No acute distress. Respiratory:  . No increased work of breathing. . No wheezes, rales, or rhonchi . No tactile fremitus Cardiovascular:  . Regular rate and rhythm . No murmurs, ectopy, or gallups. . No lateral PMI. No thrills. Abdomen:  . Abdomen is soft, non-tender, non-distended . No hernias, masses, or organomegaly  . Normoactive bowel sounds.  Musculoskeletal:  . No cyanosis, clubbing, or edema Skin:  . No rashes, lesions, ulcers . palpation of skin: no induration or nodules Neurologic:  . CN 2-12 intact . Sensation all 4 extremities intact Psychiatric:  . Mental status  Mood, affect appropriate  Orientation to person, place, time  . judgment and insight appear intact  I have personally reviewed the following:   Today's Data  . Vitals, BMP  Cardiology Data  . TEE pending.  Scheduled Meds: . amiodarone  200 mg Oral Daily  . apixaban  2.5 mg Oral Q12H  . doxazosin  4 mg Oral Daily  . ferrous sulfate  325 mg Oral BID  . fluticasone furoate-vilanterol  1 puff Inhalation Daily  . glimepiride  1 mg Oral Q breakfast  . influenza vaccine adjuvanted  0.5 mL Intramuscular Tomorrow-1000  . insulin aspart  0-9 Units Subcutaneous TID WC  . ipratropium-albuterol  3 mL Nebulization BID  . levothyroxine  88 mcg Oral Q0600  . pantoprazole  40 mg Oral BID  . rosuvastatin  10 mg Oral Daily  . sodium chloride flush  3 mL Intravenous Q12H   Continuous Infusions: . sodium chloride 250 mL (07/26/19 0856)  . sodium chloride 250 mL (07/27/19 1845)    Principal Problem:   Acute on chronic diastolic (congestive) heart failure (HCC) Active Problems:   Essential hypertension  DM type 2 (diabetes mellitus, type 2) (HCC)   Hypothyroidism   COPD (chronic obstructive pulmonary disease) (HCC)   Paroxysmal atrial fibrillation (HCC)   S/P TAVR (transcatheter aortic valve replacement)   Acute diastolic CHF (congestive heart failure) (HCC)   Pressure injury of skin   Dyspnea Pressure injury buttocks - pink non-blanchable area over sacrum Stage 1, POA   LOS: 2 days   A & P   Acute hypoxic respiratory failure: Secondary to acute exacerbation of diastolic CHF/pleural effusion with underlying COPD.  Chest x-ray on admission suggestive of right pleural effusion.  Seen by cardiology who feels it might be a  combination of COPD and CHF but patient currently not on steroids.  She did receive prednisone course 2 weeks back for bronchitis.  No wheezing currently.  BNP 943 on presentation.  Still requiring 2 L O2 via nasal cannula-Taper to off as tolerated.  Discussed with bedside nurse.  Will repeat chest x-ray to follow-up on pleural effusion--May need thoracentesis if not responding to IV diuretics.  VQ scan on 9/29 with low probability for PE.  Pneumonia: Ruled out with normal procalcitonin. Antibiotics discontinued.   Acute on chronic diastolic CHF: Appreciate cardiology evaluation and follow-up.  IV diuretics being adjusted by cardiology (watch blood pressure).  Patient takes Lasix 80 mg p.o. daily at home (currently on 80 mg IV twice daily).  Monitor daily weights, input/outputs. Follow-up repeat echo results.   A. fib with RVR: Heart rate in the 90s to 110s which could be contributing to problem #2.  On amiodarone.  Cardiology added beta-blockers but patient did not tolerate with drop in blood pressure and nausea.  Beta-blockers held and cardiology to follow-up in a.m.  Not on anticoagulation due to history of recurrent GI bleeds   Chronic anemia with history of recurrent GI bleeds: Hemoglobin stable around 10.5-11.5.  Continue to monitor.   Acute on chronic kidney disease stage III: Patient's baseline creatinine appears to have been around 1.6 in 2019/early 2020.  Serum creatinine at 2.3 on 07/08/2019.  Was elevated at 2.59 on this admission.  Tolerating diuretics well and creatinine at 2.4 today.  Holding Cozaar due to hypotension as well as AKI   COPD: Recently completed steroid treatment for bronchitis/COPD flare.  Received 125 mg IV Solu-Medrol on presentation.  Currently not on steroids.  No wheezing on exam.  Taper O2 as tolerated.  Patient states she underwent walking desat studies in PCP office sometime last year and did not qualify for home O2.  Hopefully can be weaned off O2 prior to discharge    Diabetes mellitus: Continue sliding scale insulin.  On Amaryl and Januvia at home.  Resume Amaryl at lower dose as blood glucose up to 266 yesterday-watch for hypoglycemia in the setting of AKI   AS s/p TAVR: January 2018 showed higher than expected AV valve prosthesis gradient felt to be related to vigorous LV function and valve/patient mismatch   Hyperlipidemia: Statins   Hypothyroidism: Synthroid  Pressure injury buttocks - pink non-blanchable area over sacrum Stage 1, POA.  I have seen and examined this patient myself. I have spent 32 minutes in her evaluation and care.   DVT prophylaxis: On Lovenox, monitor hemoglobin due to history of recurrent GI bleeds. Code Status: Full code Family / Patient Communication: Discussed with patient Disposition Plan: Home when medically cleared, hopefully next 24 to 48 hours  Rayann Jolley, DO [facility] Direct contact: see [email or web address]  7PM-7AM contact night coverage as above 07/28/2019, 4:01  PM  LOS: 2 days

## 2019-07-28 NOTE — Progress Notes (Addendum)
Progress Note  Patient Name: Patricia [name] Date of Encounter: 07/28/2019  Primary Cardiologist: [name] Martinique, MD   Subjective   Breathing is stable today. Remains on O2.   Inpatient Medications    Scheduled Meds: . amiodarone  200 mg Oral Daily  . apixaban  2.5 mg Oral Q12H  . doxazosin  4 mg Oral Daily  . ferrous sulfate  325 mg Oral BID  . fluticasone furoate-vilanterol  1 puff Inhalation Daily  . glimepiride  1 mg Oral Q breakfast  . influenza vaccine adjuvanted  0.5 mL Intramuscular Tomorrow-1000  . insulin aspart  0-9 Units Subcutaneous TID WC  . ipratropium-albuterol  3 mL Nebulization BID  . levothyroxine  88 mcg Oral Q0600  . pantoprazole  40 mg Oral BID  . rosuvastatin  10 mg Oral Daily   Continuous Infusions: . sodium chloride 250 mL (07/26/19 0856)  . sodium chloride 20 mL/hr at 07/28/19 0620  . sodium chloride 250 mL (07/27/19 1845)   PRN Meds: sodium chloride, acetaminophen **OR** acetaminophen, albuterol, guaiFENesin-dextromethorphan, ondansetron **OR** ondansetron (ZOFRAN) IV, sodium chloride flush   Vital Signs    Vitals:   07/27/19 2054 07/28/19 0132 07/28/19 0454 07/28/19 0458  BP: 111/70 96/67  106/70  Pulse: 74 88  85  Resp: _0 Temp: 97.6 F (36.4 C) 98.7 F (37.1 C)  97.8 F (36.6 C)  TempSrc: Oral Oral  Oral  SpO2: 97% 97%  97%  Weight:   101.3 kg   Height:        Intake/Output Summary (Last 24 hours) at 07/28/2019 0934 Last data filed at 07/28/2019 6269 Gross per 24 hour  Intake 246.92 ml  Output 750 ml  Net -503.08 ml   Last 3 Weights 07/28/2019 07/27/2019 07/26/2019  Weight (lbs) 223 lb 4.8 oz 223 lb 8 oz 221 lb 6.4 oz  Weight (kg) 101.288 kg 101.379 kg 100.426 kg      Telemetry    Afib rates 100 - Personally Reviewed  ECG    N/a - Personally Reviewed  Physical Exam  Patricia [name] GEN: No acute distress.   Neck: No JVD Cardiac: Irreg Irreg, no murmurs, rubs, or gallops.  Respiratory: Clear to auscultation  bilaterally. GI: Soft, nontender, non-distended  MS: No edema; No deformity. Neuro:  Nonfocal  Psych: Normal affect   Labs    High Sensitivity Troponin:   Recent Labs  Lab 07/25/19 1805 07/25/19 2018 07/26/19 0431  TROPONINIHS 29* 30* 29*      Chemistry Recent Labs  Lab 07/26/19 0431 07/27/19 0409 07/28/19 0323  NA 138 137 137  K 4.9 4.7 4.8  CL 99 97* 101  CO2 26 25 19*  GLUCOSE 259* 225* 147*  BUN 62* 73* 93*  CREATININE 2.45* 2.48* 3.11*  CALCIUM 8.9 8.6* 8.1*  GFRNONAA 18* 18* 13*  GFRAA 21* 21* 16*  ANIONGAP 13 15 17*     Hematology Recent Labs  Lab 07/25/19 2238 07/26/19 0431 07/27/19 0409  WBC 6.4 6.2 6.3  RBC 3.88 3.95 3.80*  HGB 10.9* 11.2* 10.8*  HCT 36.1 36.5 35.2*  MCV 93.0 92.4 92.6  MCH 28.1 28.4 28.4  MCHC 30.2 30.7 30.7  RDW 16.3* 16.5* 16.5*  PLT 168 165 177    BNP Recent Labs  Lab 07/25/19 1805  BNP 943.8*     DDimer No results for input(s): DDIMER in the last 168 hours.   Radiology    Dg Chest 2 View  Result Date:  07/27/2019 CLINICAL DATA:  Shortness of breath EXAM: CHEST - 2 VIEW COMPARISON:  07/25/2019 FINDINGS: Cardiac shadow remains enlarged. Aortic calcifications are again noted. Changes of prior TAVR are again seen. Persistent small right pleural effusion is noted. Mild parenchymal opacity remains stable. No new focal abnormality is seen. IMPRESSION: Minimal small right pleural effusion. Mild lower lobe opacity is noted and stable. Electronically Signed   By: Inez Catalina M.D.   On: 07/27/2019 14:58   Nm Pulmonary Perfusion  Result Date: 07/26/2019 CLINICAL DATA:  Shortness of breath EXAM: NUCLEAR MEDICINE PERFUSION LUNG SCAN TECHNIQUE: Perfusion images were obtained in multiple projections after intravenous injection of radiopharmaceutical. Ventilation scans intentionally deferred if perfusion scan and chest x-ray adequate for interpretation during COVID 19 epidemic. RADIOPHARMACEUTICALS:  1.6 mCi Tc-10mMAA IV  COMPARISON:  Chest radiograph July 25, 2019 FINDINGS: Radiotracer uptake bilaterally is homogeneous and symmetric bilaterally. No perfusion defects are evident. Cardiomegaly noted. IMPRESSION: No perfusion defects evident. Very low probability of pulmonary embolus. Cardiomegaly noted. Electronically Signed   By: WLowella GripIII M.D.   On: 07/26/2019 11:13    Cardiac Studies   TTE: 07/27/19  IMPRESSIONS    1. Left ventricular ejection fraction, by visual estimation, is 25 to 30%. The left ventricle has severely decreased function. Mildly increased left ventricular size. There is mildly increased left ventricular hypertrophy. Diffuse hypokinesis with  septal dyskinesis.  2. Left ventricular diastolic Doppler parameters are indeterminate pattern of LV diastolic filling.  3. Left atrial size was severely dilated.  4. Right atrial size was moderately dilated.  5. Moderate calcification of the mitral valve leaflet(s).  6. Moderate thickening of the mitral valve leaflet(s).  7. The mitral valve is degenerative. Moderate mitral valve regurgitation. Mild mitral stenosis. Mean gradient 5 mmHg but short pressure halftime.  8. The tricuspid valve is normal in structure. Tricuspid valve regurgitation mild-moderate.  9. Bioprosthetic aortic valve s/p TAVR. No significant peri-valvular regurgitation. Mean gradient 11 mmHg. 10. Global right ventricle has moderately reduced systolic function.The right ventricular size is normal. No increase in right ventricular wall thickness. 11. The inferior vena cava is dilated in size with <50% respiratory variability, suggesting right atrial pressure of 15 mmHg. 12. The tricuspid regurgitant velocity is 3.19 m/s, and with an assumed right atrial pressure of 15 mmHg, the estimated right ventricular systolic pressure is moderately elevated at 55.7 mmHg.  Patient Profile     83y.o. female with a hx of persistent atrial fibrillation, hypertension, obesity,  diabetes, hyperlipidemia, anemia, aortic stenosis status post TFOYD('74)JOIchronic diastolic heart failurewho was seen for the evaluation ofshortness of breath.  Assessment & Plan    1. Acute respiratory failure with hypoxia:suspect this to be a combination of COPD and HF. Weight is stable, little UOP yesterday. Suspect she may be dry. Will hold additional lasix for now as Cr climbed to 3.1.  2. Persistent Afib:Rates remain in the 110s. Remains on amiodarone 2024mdaily. EF now down at 25% on echo. Moderate to severely dilated atria. Suspect this could be tachy mediated. Dr. CoBurt Knackiscussed with GI and EP yesterday. Has been started on low dose Eliquis with plans for TEE/DCCV today with 4 week course of Eliquis after. Weekly CBC checks.  -- unable to tolerate BB yesteday   3. Acute combined HFNO:[id number] showed normal EF. Has been on lasix 8072maily prior to admission. Recommendations above -- EF now 25-30% on echo yesterday, diffuse hypokinesis. Suspect could be related sustained Afib prior to admission. Unable  to tolerate BB yesterday, unable to add ACE/ARB with renal disease.   4. Anemia:with hx of GI bleed. Hgb stable 10.9>>11.2>>10.2 -- follow CBC given the addition of Eliquis.   5. CKD III:Cr 2.59>>2.45>>3.1 today. Holding lasix.  6. CAD: did have a 75% OM lesion on cath back in 2017. Denies any chest pain prior to admission. HsT with low flat trend.   7. AS s/p TAVR:January 2018 showed higher than expected AV valve prosthesis gradient felt to be related to vigorous LV function and valve/patient mismatch. Stable on echo this admission.   8. COPD:followed by pulmonology. Recent steroid course.   For questions or updates, please contact [place] Please consult [email or web address] for contact info under     Signed, Reino Bellis, NP  07/28/2019, 9:34 AM    Patient seen, examined. Available data reviewed. Agree with findings, assessment, and plan as outlined by  Reino Bellis, NP.  The patient is independently interviewed and evaluated.  She is in endoscopy after undergoing TEE/cardioversion.  On my exam, she is alert and oriented in no distress.  Lungs are clear, heart is regular rate and rhythm with no murmur gallop.  Extremities show no significant edema.  Skin is warm and dry with no rash, abdomen is soft, obese, nontender.  Telemetry shows sinus bradycardia 52 bpm.  I agree with the plans as outlined above.  Agree with holding furosemide because of increasing creatinine.  Repeat metabolic panel tomorrow.  Hopefully she will be ready for discharge tomorrow.  We are going to try to keep her on apixaban for 1 month following cardioversion with frequent CBCs to rule out significant bleeding/anemia.  Sherren Mocha, M.D. 07/28/2019 12:48 PM

## 2019-07-28 NOTE — Anesthesia Procedure Notes (Signed)
Procedure Name: MAC Performed by: Valda Favia, CRNA Pre-anesthesia Checklist: Patient identified, Emergency Drugs available, Suction available, Patient being monitored and Timeout performed Patient Re-evaluated:Patient Re-evaluated prior to induction Oxygen Delivery Method: Nasal cannula Placement Confirmation: positive ETCO2 Dental Injury: Teeth and Oropharynx as per pre-operative assessment

## 2019-07-28 NOTE — Transfer of Care (Signed)
Immediate Anesthesia Transfer of Care Note  Patient: Patricia [name]  Procedure(s) Performed: TRANSESOPHAGEAL ECHOCARDIOGRAM (TEE) (N/A ) CARDIOVERSION (N/A )  Patient Location: Endoscopy Unit  Anesthesia Type:MAC and General  Level of Consciousness: awake, alert  and oriented  Airway & Oxygen Therapy: Patient Spontanous Breathing and Patient connected to nasal cannula oxygen  Post-op Assessment: Report given to RN and Post -op Vital signs reviewed and stable  Post vital signs: Reviewed and stable  Last Vitals:  Vitals Value Taken Time  BP    Temp    Pulse    Resp    SpO2      Last Pain:  Vitals:   07/28/19 1121  TempSrc: Temporal  PainSc: 0-No pain      Patients Stated Pain Goal: 0 (67/61/95 0932)  Complications: No apparent anesthesia complications

## 2019-07-28 NOTE — Progress Notes (Signed)
Echocardiogram 2D Echocardiogram has been performed.  Oneal Deputy Bernhard Koskinen 07/28/2019, 12:34 PM

## 2019-07-28 NOTE — Progress Notes (Signed)
Patient back from endo alert and oriented, SB on the monitor with first degree HB, pt. Denies pain. Will continue to monitor the patient

## 2019-07-28 NOTE — CV Procedure (Signed)
   TRANSESOPHAGEAL ECHOCARDIOGRAM GUIDED DIRECT CURRENT CARDIOVERSION  NAME:  Patricia [name]   MRN: [id number] DOB:  January 11, 1936   ADMIT DATE: 07/25/2019  INDICATIONS: Symptomatic atrial fibrillation  PROCEDURE:   Informed consent was obtained prior to the procedure. The risks, benefits and alternatives for the procedure were discussed and the patient comprehended these risks.  Risks include, but are not limited to, cough, sore throat, vomiting, nausea, somnolence, esophageal and stomach trauma or perforation, bleeding, low blood pressure, aspiration, pneumonia, infection, trauma to the teeth and death.    After a procedural time-out, the oropharynx was anesthetized and the patient was sedated by the anesthesia service. The transesophageal probe was inserted in the esophagus and stomach without difficulty and multiple views were obtained. Anesthesia was monitored by Dr. Fransisco Beau.   COMPLICATIONS:    Complications: No complications Patient tolerated procedure well.  FINDINGS:  LEFT VENTRICLE: EF reduced, visual estimate 35%. Global hypokinesis.  RIGHT VENTRICLE: Normal size and function.   LEFT ATRIUM: No thrombus/mass.  LEFT ATRIAL APPENDAGE: No thrombus/mass.   RIGHT ATRIUM: No thrombus/mass.  AORTIC VALVE:  S/P TAVR. Valve leaflets well visualized, very trivial central regurgitation. No vegetation.  MITRAL VALVE: Moderately calcified annulus and calcified leaflets. Mild regurgitation. No vegetation.  TRICUSPID VALVE: Normal structure. Moderate regurgitation. No vegetation.  PULMONIC VALVE: Grossly normal structure. Trivial regurgitation. No apparent vegetation.  INTERATRIAL SEPTUM: Small PFO seen by color Doppler.  PERICARDIUM: Trivial effusion noted.  DESCENDING AORTA: Moderate diffuse plaque seen   CARDIOVERSION:     Indications:  Symptomatic Atrial Fibrillation  Procedure Details:  Once the TEE was complete, the patient had the defibrillator pads placed in the  anterior and posterior position. Once an appropriate level of sedation was confirmed, the patient was cardioverted x 1 with 150J of biphasic synchronized energy.  The patient converted to sinus bradycardia.  There were no apparent complications.  The patient had normal neuro status and respiratory status post procedure with vitals stable as recorded elsewhere.  Adequate airway was maintained throughout and vital signs monitored per protocol.  Buford Dresser, MD, PhD [facility]  [address] [place], [place] [address] [phone]   12:26 PM

## 2019-07-28 NOTE — Interval H&P Note (Signed)
History and Physical Interval Note:  07/28/2019 11:43 AM  Patricia [name]  has presented today for surgery, with the diagnosis of afib.  The various methods of treatment have been discussed with the patient and family. After consideration of risks, benefits and other options for treatment, the patient has consented to  Procedure(s): TRANSESOPHAGEAL ECHOCARDIOGRAM (TEE) (N/A) CARDIOVERSION (N/A) as a surgical intervention.  The patient's history has been reviewed, patient examined, no change in status, stable for surgery.  I have reviewed the patient's chart and labs.  Questions were answered to the patient's satisfaction.     Buford Dresser  We discussed the procedure at length, as well as the anticoagulation needs short term. She understands and agrees. No contraindications to TEE or CV.

## 2019-07-28 NOTE — Anesthesia Preprocedure Evaluation (Addendum)
Anesthesia Evaluation  Patient identified by MRN, date of birth, ID band Patient awake    Reviewed: Allergy & Precautions, NPO status , Patient's Chart, lab work & pertinent test results  History of Anesthesia Complications (+) PONV and history of anesthetic complications  Airway Mallampati: II  TM Distance: >3 FB Neck ROM: Full    Dental  (+) Dental Advisory Given, Partial Upper, Partial Lower   Pulmonary asthma , COPD,  COPD inhaler, former smoker,     + decreased breath sounds      Cardiovascular hypertension, Pt. on medications (-) angina+CHF  + dysrhythmias Atrial Fibrillation + Valvular Problems/Murmurs MR  Rhythm:Irregular Rate:Tachycardia   S/p TAVR  '20 TTE - EF 25 to 30%. Mildly increased left ventricular hypertrophy. Diffuse hypokinesis with septal dyskinesis. Left atrial size was severely dilated. Right atrial size was moderately dilated. Moderate mitral valve regurgitation. Mild mitral stenosis. Tricuspid valve regurgitation mild-moderate. Bioprosthetic aortic valve s/p TAVR. No significant peri-valvular regurgitation. Global right ventricle has moderately reduced systolic function.The estimated right ventricular systolic pressure is moderately elevated at 55.7 mmHg.    Neuro/Psych negative neurological ROS  negative psych ROS   GI/Hepatic Neg liver ROS, GERD  Medicated and Controlled,  Endo/Other  diabetes, Type 2, Oral Hypoglycemic AgentsHypothyroidism Morbid obesity  Renal/GU negative Renal ROS     Musculoskeletal  (+) Arthritis ,  Scoliosis    Abdominal   Peds  Hematology negative hematology ROS (+)   Anesthesia Other Findings   Reproductive/Obstetrics                            Anesthesia Physical Anesthesia Plan  ASA: IV  Anesthesia Plan: MAC and General   Post-op Pain Management:    Induction: Intravenous  PONV Risk Score and Plan: 4 or greater and Propofol  infusion and Treatment may vary due to age or medical condition  Airway Management Planned: Nasal Cannula and Natural Airway  Additional Equipment: None  Intra-op Plan:   Post-operative Plan:   Informed Consent: I have reviewed the patients History and Physical, chart, labs and discussed the procedure including the risks, benefits and alternatives for the proposed anesthesia with the patient or authorized representative who has indicated his/her understanding and acceptance.       Plan Discussed with: CRNA and Anesthesiologist  Anesthesia Plan Comments: (Begin TEE as MAC, convert to GA with natural airway if performing cardioversion )       Anesthesia Quick Evaluation

## 2019-07-29 ENCOUNTER — Other Ambulatory Visit: Payer: Self-pay | Admitting: Physician Assistant

## 2019-07-29 ENCOUNTER — Telehealth: Payer: Self-pay | Admitting: Physician Assistant

## 2019-07-29 ENCOUNTER — Encounter (HOSPITAL_COMMUNITY): Payer: Self-pay | Admitting: Cardiology

## 2019-07-29 DIAGNOSIS — I4819 Other persistent atrial fibrillation: Secondary | ICD-10-CM

## 2019-07-29 DIAGNOSIS — N178 Other acute kidney failure: Secondary | ICD-10-CM

## 2019-07-29 LAB — BASIC METABOLIC PANEL
Anion gap: 14 (ref 5–15)
BUN: 97 mg/dL — ABNORMAL HIGH (ref 8–23)
CO2: 27 mmol/L (ref 22–32)
Calcium: 8.3 mg/dL — ABNORMAL LOW (ref 8.9–10.3)
Chloride: 97 mmol/L — ABNORMAL LOW (ref 98–111)
Creatinine, Ser: 3.28 mg/dL — ABNORMAL HIGH (ref 0.44–1.00)
GFR calc Af Amer: 15 mL/min — ABNORMAL LOW (ref >60)
GFR calc non Af Amer: 13 mL/min — ABNORMAL LOW (ref >60)
Glucose, Bld: 171 mg/dL — ABNORMAL HIGH (ref 70–99)
Potassium: 4.4 mmol/L (ref 3.5–5.1)
Sodium: 138 mmol/L (ref 135–145)

## 2019-07-29 LAB — CBC
HCT: 34.3 % — ABNORMAL LOW (ref 36.0–46.0)
Hemoglobin: 10.6 g/dL — ABNORMAL LOW (ref 12.0–15.0)
MCH: 28.7 pg (ref 26.0–34.0)
MCHC: 30.9 g/dL (ref 30.0–36.0)
MCV: 93 fL (ref 80.0–100.0)
Platelets: 147 10*3/uL — ABNORMAL LOW (ref 150–400)
RBC: 3.69 MIL/uL — ABNORMAL LOW (ref 3.87–5.11)
RDW: 16.5 % — ABNORMAL HIGH (ref 11.5–15.5)
WBC: 5.7 10*3/uL (ref 4.0–10.5)
nRBC: 0 % (ref 0.0–0.2)

## 2019-07-29 LAB — GLUCOSE, CAPILLARY
Glucose-Capillary: 153 mg/dL — ABNORMAL HIGH (ref 70–99)
Glucose-Capillary: 168 mg/dL — ABNORMAL HIGH (ref 70–99)
Glucose-Capillary: 168 mg/dL — ABNORMAL HIGH (ref 70–99)

## 2019-07-29 MED ORDER — APIXABAN 2.5 MG PO TABS: 2.5000 mg | ORAL_TABLET | Freq: Two times a day (BID) | ORAL | 0 refills | Status: DC

## 2019-07-29 MED ORDER — AMIODARONE HCL 200 MG PO TABS: 200.0000 mg | ORAL_TABLET | Freq: Every day | ORAL | 0 refills | Status: DC

## 2019-07-29 NOTE — Discharge Instructions (Signed)

## 2019-07-29 NOTE — Telephone Encounter (Signed)
Still admitted-07/25/2019 - present (4 days)  [place]

## 2019-07-29 NOTE — TOC Initial Note (Signed)
Transition of Care [facility]) - Initial/Assessment Note    Patient Details  Name: Patricia [name] MRN: [id number] Date of Birth: 03-Feb-1936  [facility]) CM/SW Contact:    Eileen Stanford, LCSW Phone Number: 07/29/2019, 12:19 PM  Clinical Narrative:       Pt will d/c home. 02 has been set up with [employer]. PLEASE WAIT FOR PT TO BE DELIEVERED TO PT'S ROOM PRIOR TO D/C.            Expected Discharge Plan: Home/Self Care Barriers to Discharge: Equipment Delay   Patient Goals and CMS Choice        Expected Discharge Plan and Services Expected Discharge Plan: Home/Self Care In-house Referral: NA     Living arrangements for the past 2 months: Single Family Home Expected Discharge Date: 07/29/19               DME Arranged: Oxygen DME Agency: [employer] Date DME Agency Contacted: 07/29/19 Time DME Agency Contacted: 1218 Representative spoke with at DME Agency: zack HH Arranged: NA          Prior Living Arrangements/Services Living arrangements for the past 2 months: [place] Lives with:: Self Patient language and need for interpreter reviewed:: Yes Do you feel safe going back to the place where you live?: Yes      Need for Family Participation in Patient Care: Yes (Comment) Care giver support system in place?: Yes (comment)   Criminal Activity/Legal Involvement Pertinent to Current Situation/Hospitalization: No - Comment as needed  Activities of Daily Living Home Assistive Devices/Equipment: None, Walker (specify type) ADL Screening (condition at time of admission) Patient's cognitive ability adequate to safely complete daily activities?: Yes Is the patient deaf or have difficulty hearing?: No Does the patient have difficulty seeing, even when wearing glasses/contacts?: No Does the patient have difficulty concentrating, remembering, or making decisions?: No Patient able to express need for assistance with ADLs?: Yes Does the patient have difficulty  dressing or bathing?: No Independently performs ADLs?: Yes (appropriate for developmental age) Does the patient have difficulty walking or climbing stairs?: No Weakness of Legs: None Weakness of Arms/Hands: None  Permission Sought/Granted Permission sought to share information with : Facility Sport and exercise psychologist, Family Supports    Share Information with NAME: [name]  Permission granted to share info w AGENCY: [place] granted to share info w Relationship: daughter     Emotional Assessment Appearance:: Appears stated age Attitude/Demeanor/Rapport: Engaged Affect (typically observed): Accepting Orientation: : Oriented to Self, Oriented to Place, Oriented to  Time, Oriented to Situation Alcohol / Substance Use: Not Applicable Psych Involvement: No (comment)  Admission diagnosis:  Paroxysmal atrial fibrillation (HCC) [I48.0] SOB (shortness of breath) [R06.02] Acute on chronic diastolic congestive heart failure (HCC) [I50.33] Acute respiratory failure with hypoxia (HCC) [J96.01] Chronic kidney disease, unspecified CKD stage [K87.6] Acute diastolic CHF (congestive heart failure) ([place]) [I50.31] Patient Active Problem List   Diagnosis Date Noted  . Pressure injury of skin 07/26/2019  . Dyspnea 07/26/2019  . Acute on chronic diastolic (congestive) heart failure ([place]) 07/25/2019  . Acute diastolic CHF (congestive heart failure) ([place]) 07/25/2019  . COPD with acute exacerbation ([place]) 03/17/2019  . GI bleed 01/20/2018  . Acute renal failure superimposed on stage 3 chronic kidney disease ([place]) 01/20/2018  . UGIB (upper gastrointestinal bleed) 03/11/2017  . AKI (acute kidney injury) ([place]) 03/11/2017  . Lung nodule 01/26/2017  . Macrocytosis 01/20/2017  . Hypoxemia 10/24/2016  . Asthma 10/24/2016  . Hypersomnia  10/24/2016  . Obesity hypoventilation syndrome ([place]) 10/24/2016  . 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 Aortic valve stenosis 09/11/2016   . Acute blood loss anemia 02/06/2016  . Acute on chronic diastolic heart failure ([place]) 02/06/2016  . Paroxysmal atrial fibrillation ([place]) 02/05/2016  . Primary localized osteoarthrosis of right shoulder 12/18/2015  . Osteoarthritis of right shoulder 12/18/2015  . Post-nasal drainage 11/27/2015  . Anemia 09/26/2015  . Neck pain 05/23/2015  . Knee pain, bilateral 07/20/2014  . Lumbago 11/02/2013  . Trigger finger, acquired 11/02/2013  . Hyperlipidemia 11/02/2013  . Urinary incontinence 11/02/2013  . Rash and nonspecific skin eruption 11/02/2013  . COPD (chronic obstructive pulmonary disease) ([place])   . Hypothyroidism   . Essential hypertension 03/02/2013  . DM type 2 (diabetes mellitus, type 2) ([place]) 03/02/2013   PCP:  [name], NP Pharmacy:   [facility] [place], [place] [address] [place] [address] Phone: [phone] Fax: [phone]     Social Determinants of Health (SDOH) Interventions    Readmission Risk Interventions No flowsheet data found.

## 2019-07-29 NOTE — Telephone Encounter (Signed)
Pt has f/u appt 10/13 with Angie Duke - going home today. Will need TOC call. Thanks. Pt of Dr. Martinique.

## 2019-07-29 NOTE — Progress Notes (Signed)
SATURATION QUALIFICATIONS: (This note is used to comply with regulatory documentation for home oxygen)  Patient Saturations on Room Air at Rest = 92%  Patient Saturations on Room Air while Ambulating = 87%  Patient Saturations on 2Liters of oxygen while Ambulating = 96%  Please briefly explain why patient needs home oxygen: pt desats  while ambulating

## 2019-07-29 NOTE — Discharge Summary (Signed)
Physician Discharge Summary  CLORIS FLIPPO WEX:[id number] DOB: Nov 29, 1935 DOA: 07/25/2019  PCP: [name], NP  Admit date: 07/25/2019 Discharge date: 07/29/2019  Recommendations for Outpatient Follow-up:  1. Follow up with PCP in 7-10 days. 2. Chemistry and CBC to be drawn on 08/01/2019 and reported to cardiology and PCP. 3. Follow up with cardiology as directed 4. Home O2, 2L by nasal cannula ordered for discharge.  Follow-up Information    [facility] Follow up on 08/01/2019.   Specialty: Cardiology Why: [place] location - please come to the office on Monday morning (08/01/19) to have bloodwork (BMET and CBC). You do not have to have an appointment for this - just let them know you are there for labs. Contact information: [address] [place] [phone]       Ledora Bottcher, PA Follow up on 08/09/2019.   Specialties: Physician Assistant, Cardiology, Radiology Why: [facility] location - see appointment information above for 08/09/19. Janace Hoard Duke is one of the PAs that works with Dr. Martinique and his care team. You will also keep your follow-up with Dr. Martinique in November. Contact information: [address] [place] [address] [phone]        [name], NP.   Specialty: Geriatric Medicine Why: please follow up Contact information: [place]. [place] [address] [phone]          Discharge Diagnoses: Principal diagnosis is #1 1. Acute hypoxic respiratory failure 2. Pneumonia 3. Acute on chronic diastolic CHF 4. Atrial Fibrillation with RVR 5. Chronic anemia with history of recurrent GI bleeds 6. Acute on chronic kidney disease 7. COPD 8. Diabetes mellitus 9. Aortic stenosis s/p TAVR 10. Hyperlipidemia 11. Hypothyroidism 12. Pressure injury to buttocks - Stage 1, POA  Discharge Condition: Fair Disposition: Home  Diet  recommendation: Heart healthy  Filed Weights   07/28/19 0454 07/28/19 1121 07/29/19 0414  Weight: 101.3 kg 101.3 kg 102.2 kg    History of present illness:   NEVAYAH FAUST is a 83 y.o. female with history of diastolic CHF last EF measured in December 2018 was 62 to 54% with diastolic dysfunction, status post TAVR, COPD, A. fib, anemia, chronic kidney disease stage III, diabetes mellitus presents to the ER because of worsening shortness of breath.  Patient states she has been short of breath for last few weeks which has been progressively worsening.  About 2 weeks ago patient was placed on steroids by her pulmonologist for bronchitis.  Patient denies any fever chills productive cough or has not noticed any weight gain.  Shortness of breath is present even at rest and increased on exertion.  ED Course: In the ER chest x-ray shows possible infiltrates.  EKG shows A. fib rate controlled with LBBB.  On exam patient has mild wheezing bilaterally.  Was given IV Solu-Medrol and since patient also has features concerning for CHF was given Lasix 40 mg IV and admitted for acute respiratory failure hypoxia.  COVID-19 test was negative.  Labs show worsening renal function over the last 1 month.  Creatinine was 2.5 hemoglobin 10.9 BNP 943 high-sensitivity troponin was 29 and 30.  Hospital Course:  83 y.o.femalewithhistory of diastolic CHF last EF measured in December 2018 was 74 to 00% with diastolic dysfunction, status post TAVR, COPD, A. Fiboff canticoagulation due to recurrent GI bleeds, anemia, chronic kidney disease stage III, diabetes mellitus presents to the ER because of  shortness of breath for last few weeks.About 2 weeks ago patient was treated withsteroids by her pulmonologist for bronchitis.CXR on admission revealed right sided pleural effusion with possible underlying pneumonia vs atelectasis. Antibiotics discontinued after procalcitoninresulted wnl.Patient follows Dr. Burt Knack as  outpatient and has been evaluated by cardiology while here. On 07/28/2019 the patient underwent TEE with DCCV to be followed by a 4 week course of Eliquis. EF on TEE was 25 - 30% with severely depressed left ventricular function.   Although she saturated well on room air at rest, the patient's oxygen saturation dropped precipitously upon walking. She will be discharged to home on 2L O2 continuous.  Today's assessment: S: The patient is sitting up on the edge of the bed. O: Vitals:  Vitals:   07/29/19 0924 07/29/19 1220  BP:  (!) 128/59  Pulse:  (!) 57  Resp:  18  Temp:  97.8 F (36.6 C)  SpO2: 100% 99%    Exam:  Constitutional:   The patient is awake, alert, and oriented x 3. No acute distress. Respiratory:   No increased work of breathing.  No wheezes, rales, or rhonchi  No tactile fremitus Cardiovascular:   Regular rate and rhythm  No murmurs, ectopy, or gallups.  No lateral PMI. No thrills. Abdomen:   Abdomen is soft, non-tender, non-distended  No hernias, masses, or organomegaly  Normoactive bowel sounds.  Musculoskeletal:   No cyanosis, clubbing, or edema Skin:   No rashes, lesions, ulcers  palpation of skin: no induration or nodules Neurologic:   CN 2-12 intact  Sensation all 4 extremities intact Psychiatric:   Mental status o Mood, affect appropriate o Orientation to person, place, time   judgment and insight appear intact   Discharge Instructions  Discharge Instructions    (HEART FAILURE PATIENTS) Call MD:  Anytime you have any of the following symptoms: 1) 3 pound weight gain in 24 hours or 5 pounds in 1 week 2) shortness of breath, with or without a dry hacking cough 3) swelling in the hands, feet or stomach 4) if you have to sleep on extra pillows at night in order to breathe.   Complete by: As directed    Activity as tolerated - No restrictions   Complete by: As directed    Call MD for:  difficulty breathing, headache or visual  disturbances   Complete by: As directed    Call MD for:  persistant nausea and vomiting   Complete by: As directed    Call MD for:  severe uncontrolled pain   Complete by: As directed    Diet - low sodium heart healthy   Complete by: As directed    Diet - low sodium heart healthy   Complete by: As directed    Diet Carb Modified   Complete by: As directed    Discharge instructions   Complete by: As directed    BMP to be drawn on 07/31/2019. Report results to Dr. Burt Knack and PCP. Follow up with Dr. Burt Knack as directed. Follow up with PCP in 7-10 days.   Heart Failure patients record your daily weight using the same scale at the same time of day   Complete by: As directed    Increase activity slowly   Complete by: As directed    Increase activity slowly   Complete by: As directed    STOP any activity that causes chest pain, shortness of breath, dizziness, sweating, or exessive weakness   Complete by: As directed  Allergies as of 07/29/2019      Reactions   Codeine Other (See Comments)   "Spaces out" the patient and she cannot walk well   Vesicare [solifenacin] Itching      Medication List    STOP taking these medications   furosemide 40 MG tablet Commonly known as: LASIX   glimepiride 2 MG tablet Commonly known as: AMARYL   losartan 25 MG tablet Commonly known as: COZAAR   potassium gluconate 595 (99 K) MG Tabs tablet     TAKE these medications   acetaminophen 650 MG CR tablet Commonly known as: TYLENOL Take 1,300 mg by mouth every 8 (eight) hours as needed for pain. What changed: Another medication with the same name was removed. Continue taking this medication, and follow the directions you see here.   albuterol 108 (90 Base) MCG/ACT inhaler Commonly known as: Ventolin HFA Inhale 2 puffs into the lungs every 6 (six) hours as needed for wheezing or shortness of breath. What changed: Another medication with the same name was changed. Make sure you understand how  and when to take each.   albuterol (2.5 MG/3ML) 0.083% nebulizer solution Commonly known as: PROVENTIL USE 1 VIAL IN NEBULIZER EVERY 4 HOURS AND AS NEEDED. Generic: VENTOLIN What changed:   how much to take  how to take this  when to take this  reasons to take this  additional instructions   amiodarone 200 MG tablet Commonly known as: PACERONE Take 1 tablet (200 mg total) by mouth daily. Start taking on: July 30, 2019 What changed:   how much to take  how to take this  when to take this  additional instructions   apixaban 2.5 MG Tabs tablet Commonly known as: ELIQUIS Take 1 tablet (2.5 mg total) by mouth every 12 (twelve) hours.   Breo Ellipta 200-25 MCG/INH Aepb Generic drug: fluticasone furoate-vilanterol INHALE 1 PUFF INTO THE LUNGS DAILY What changed: See the new instructions.   cetirizine 10 MG tablet Commonly known as: ZYRTEC Take 10 mg by mouth daily as needed for allergies.   cholecalciferol 1000 units tablet Commonly known as: VITAMIN D Take 1,000 Units by mouth at bedtime.   doxazosin 4 MG tablet Commonly known as: CARDURA TAKE 1 TABLET BY MOUTH ONCE DAILY TO HELP CONTROL BLOOD PRESSURE What changed:   how much to take  how to take this  when to take this   ferrous sulfate 325 (65 FE) MG EC tablet Take 325 mg by mouth 2 (two) times daily.   glucose blood test strip Commonly known as: ONE TOUCH ULTRA TEST Use as instructed to test blood sugar twice daily DX: E11.9   Januvia 50 MG tablet Generic drug: sitaGLIPtin TAKE 1 TABLET(50 MG) BY MOUTH DAILY What changed: See the new instructions.   levothyroxine 88 MCG tablet Commonly known as: SYNTHROID TAKE 1 TABLET BY MOUTH DAILY ON AN EMPTY STOMACH What changed:   how much to take  how to take this  when to take this  additional instructions   ONE TOUCH ULTRA 2 w/Device Kit Use as instructed to test blood sugar twice daily DX: E11.9   onetouch ultrasoft lancets Use as  instructed to test blood sugar twice daily DX: E11.9   pantoprazole 40 MG tablet Commonly known as: PROTONIX TAKE 1 TABLET(40 MG) BY MOUTH TWICE DAILY What changed: See the new instructions.   rosuvastatin 10 MG tablet Commonly known as: Crestor Take 1 tablet (10 mg total) by mouth daily. What changed: when  to take this   triamcinolone 0.025 % cream Commonly known as: KENALOG APPLY  CREAM TOPICALLY ONCE DAILY AS NEEDED FOR PSORIASIS What changed: See the new instructions.            Durable Medical Equipment  (From admission, onward)         Start     Ordered   07/29/19 1158  DME Oxygen  Once    Question Answer Comment  Length of Need Lifetime   Mode or (Route) Nasal cannula   Liters per Minute 2   Frequency Continuous (stationary and portable oxygen unit needed)   Oxygen conserving device Yes   Oxygen delivery system Gas      07/29/19 1158         Allergies  Allergen Reactions   Codeine Other (See Comments)    "Spaces out" the patient and she cannot walk well   Vesicare [Solifenacin] Itching    The results of significant diagnostics from this hospitalization (including imaging, microbiology, ancillary and laboratory) are listed below for reference.    Significant Diagnostic Studies: Dg Chest 2 View  Result Date: 07/27/2019 CLINICAL DATA:  Shortness of breath EXAM: CHEST - 2 VIEW COMPARISON:  07/25/2019 FINDINGS: Cardiac shadow remains enlarged. Aortic calcifications are again noted. Changes of prior TAVR are again seen. Persistent small right pleural effusion is noted. Mild parenchymal opacity remains stable. No new focal abnormality is seen. IMPRESSION: Minimal small right pleural effusion. Mild lower lobe opacity is noted and stable. Electronically Signed   By: Inez Catalina M.D.   On: 07/27/2019 14:58   Dg Chest 2 View  Result Date: 07/08/2019 CLINICAL DATA:  Shortness of breath. EXAM: CHEST - 2 VIEW COMPARISON:  08/12/2018 FINDINGS: Lungs are  hyperexpanded. Interstitial markings are diffusely coarsened with chronic features. Pleuroparenchymal scarring is noted at the right base, as confirmed by CT of 01/25/2018. Status post cardiac valve replacement. Status post right shoulder replacement. IMPRESSION: Emphysema with chronic interstitial coarsening period no acute cardiopulmonary findings. Similar appearance of Pleuroparenchymal scarring in the right lung base. Electronically Signed   By: Misty Stanley M.D.   On: 07/08/2019 12:34   Nm Pulmonary Perfusion  Result Date: 07/26/2019 CLINICAL DATA:  Shortness of breath EXAM: NUCLEAR MEDICINE PERFUSION LUNG SCAN TECHNIQUE: Perfusion images were obtained in multiple projections after intravenous injection of radiopharmaceutical. Ventilation scans intentionally deferred if perfusion scan and chest x-ray adequate for interpretation during COVID 19 epidemic. RADIOPHARMACEUTICALS:  1.6 mCi Tc-41mMAA IV COMPARISON:  Chest radiograph July 25, 2019 FINDINGS: Radiotracer uptake bilaterally is homogeneous and symmetric bilaterally. No perfusion defects are evident. Cardiomegaly noted. IMPRESSION: No perfusion defects evident. Very low probability of pulmonary embolus. Cardiomegaly noted. Electronically Signed   By: WLowella GripIII M.D.   On: 07/26/2019 11:13   Dg Chest Portable 1 View  Result Date: 07/25/2019 CLINICAL DATA:  Chest pain and shortness of breath EXAM: PORTABLE CHEST 1 VIEW COMPARISON:  July 08, 2019 FINDINGS: Stable cardiomegaly. No pneumothorax. No nodules or masses. The left lung is clear. There is a small effusion on the right with mild opacity in the right base, more prominent the interval. No other changes. IMPRESSION: 1. There is a small right pleural effusion. Right basilar opacity is more prominent the interval and could represent developing infiltrate/pneumonia versus atelectasis. Electronically Signed   By: DDorise BullionIII M.D   On: 07/25/2019 18:36     Microbiology: Recent Results (from the past 240 hour(s))  SARS Coronavirus 2 ([facility]  [facility] [place] lab) Nasopharyngeal Nasopharyngeal Swab     Status: None   Collection Time: 07/25/19  6:24 PM   Specimen: Nasopharyngeal Swab  Result Value Ref Range Status   SARS Coronavirus 2 NEGATIVE NEGATIVE Final    Comment: (NOTE) If result is NEGATIVE SARS-CoV-2 target nucleic acids are NOT DETECTED. The SARS-CoV-2 RNA is generally detectable in upper and lower  respiratory specimens during the acute phase of infection. The lowest  concentration of SARS-CoV-2 viral copies this assay can detect is 250  copies / mL. A negative result does not preclude SARS-CoV-2 infection  and should not be used as the sole basis for treatment or other  patient management decisions.  A negative result may occur with  improper specimen collection / handling, submission of specimen other  than nasopharyngeal swab, presence of viral mutation(s) within the  areas targeted by this assay, and inadequate number of viral copies  (<250 copies / mL). A negative result must be combined with clinical  observations, patient history, and epidemiological information. If result is POSITIVE SARS-CoV-2 target nucleic acids are DETECTED. The SARS-CoV-2 RNA is generally detectable in upper and lower  respiratory specimens dur ing the acute phase of infection.  Positive  results are indicative of active infection with SARS-CoV-2.  Clinical  correlation with patient history and other diagnostic information is  necessary to determine patient infection status.  Positive results do  not rule out bacterial infection or co-infection with other viruses. If result is PRESUMPTIVE POSTIVE SARS-CoV-2 nucleic acids MAY BE PRESENT.   A presumptive positive result was obtained on the submitted specimen  and confirmed on repeat testing.  While 2019 novel coronavirus  (SARS-CoV-2) nucleic acids may be present in  the submitted sample  additional confirmatory testing may be necessary for epidemiological  and / or clinical management purposes  to differentiate between  SARS-CoV-2 and other Sarbecovirus currently known to infect humans.  If clinically indicated additional testing with an alternate test  methodology [phone]) is advised. The SARS-CoV-2 RNA is generally  detectable in upper and lower respiratory sp ecimens during the acute  phase of infection. The expected result is Negative. Fact Sheet for Patients:  StrictlyIdeas.no Fact Sheet for Healthcare Providers: BankingDealers.co.za This test is not yet approved or cleared by the [place] FDA and has been authorized for detection and/or diagnosis of SARS-CoV-2 by FDA under an Emergency Use Authorization (EUA).  This EUA will remain in effect (meaning this test can be used) for the duration of the COVID-19 declaration under Section 564(b)(1) of the Act, 21 U.S.C. section 360bbb-3(b)(1), unless the authorization is terminated or revoked sooner. Performed at [facility], [address] [place], [place] [address]      Labs: Basic Metabolic Panel: Recent Labs  Lab 07/25/19 1805 07/25/19 2238 07/26/19 0431 07/27/19 0409 07/28/19 0323 07/29/19 0312  NA 137  --  138 137 137 138  K 4.3  --  4.9 4.7 4.8 4.4  CL 98  --  99 97* 101 97*  CO2 23  --  26 25 19* 27  GLUCOSE 171*  --  259* 225* 147* 171*  BUN 62*  --  62* 73* 93* 97*  CREATININE 2.59* 2.54* 2.45* 2.48* 3.11* 3.28*  CALCIUM 9.0  --  8.9 8.6* 8.1* 8.3*  MG  --   --  2.3  --   --   --    Liver Function Tests: No results for input(s): AST, ALT, ALKPHOS, BILITOT, PROT,  ALBUMIN in the last 168 hours. No results for input(s): LIPASE, AMYLASE in the last 168 hours. No results for input(s): AMMONIA in the last 168 hours. CBC: Recent Labs  Lab 07/25/19 1805 07/25/19 2238 07/26/19 0431 07/27/19 0409 07/29/19 0312  WBC  6.2 6.4 6.2 6.3 5.7  NEUTROABS 5.0  --   --   --   --   HGB 10.9* 10.9* 11.2* 10.8* 10.6*  HCT 36.4 36.1 36.5 35.2* 34.3*  MCV 94.8 93.0 92.4 92.6 93.0  PLT 166 168 165 177 147*   Cardiac Enzymes: No results for input(s): CKTOTAL, CKMB, CKMBINDEX, TROPONINI in the last 168 hours. BNP: BNP (last 3 results) Recent Labs    07/25/19 1805  BNP 943.8*    ProBNP (last 3 results) Recent Labs    08/12/18 1007 07/08/19 1139 07/15/19 1222  PROBNP 312.0* 1,184.0* 835.0*    CBG: Recent Labs  Lab 07/28/19 0608 07/28/19 1618 07/28/19 2246 07/29/19 0641 07/29/19 1216  GLUCAP 140* 212* 153* 168* 168*    Principal Problem:   Acute on chronic diastolic (congestive) heart failure (HCC) Active Problems:   Essential hypertension   DM type 2 (diabetes mellitus, type 2) (HCC)   Hypothyroidism   COPD (chronic obstructive pulmonary disease) (HCC)   Paroxysmal atrial fibrillation (HCC)   S/P TAVR (transcatheter aortic valve replacement)   Acute diastolic CHF (congestive heart failure) (HCC)   Pressure injury of skin   Dyspnea   Time coordinating discharge: 38 minutes.  Signed:        Jasmia Angst, DO [facility]  07/29/2019, 3:10 PM

## 2019-07-29 NOTE — Progress Notes (Signed)
Progress Note  Patient Name: Patricia [name] Date of Encounter: 07/29/2019  Primary Cardiologist: [name] Martinique, MD   Subjective   Feeling well today.  No chest pain or shortness of breath at rest.  Sitting up on the bedside eager to go home.  Inpatient Medications    Scheduled Meds: . amiodarone  200 mg Oral Daily  . apixaban  2.5 mg Oral Q12H  . doxazosin  4 mg Oral Daily  . ferrous sulfate  325 mg Oral BID  . fluticasone furoate-vilanterol  1 puff Inhalation Daily  . glimepiride  1 mg Oral Q breakfast  . influenza vaccine adjuvanted  0.5 mL Intramuscular Tomorrow-1000  . insulin aspart  0-9 Units Subcutaneous TID WC  . ipratropium-albuterol  3 mL Nebulization BID  . levothyroxine  88 mcg Oral Q0600  . pantoprazole  40 mg Oral BID  . rosuvastatin  10 mg Oral Daily  . sodium chloride flush  3 mL Intravenous Q12H   Continuous Infusions: . sodium chloride 250 mL (07/26/19 0856)  . sodium chloride 250 mL (07/27/19 1845)   PRN Meds: sodium chloride, acetaminophen **OR** acetaminophen, albuterol, guaiFENesin-dextromethorphan, ondansetron **OR** ondansetron (ZOFRAN) IV, sodium chloride flush   Vital Signs    Vitals:   07/28/19 2114 07/28/19 2250 07/29/19 0413 07/29/19 0414  BP:  (!) 109/53 (!) 117/44   Pulse:  (!) 57 (!) 56   Resp:  16 16   Temp:  98.1 F (36.7 C) (!) 97.5 F (36.4 C)   TempSrc:  Oral Oral   SpO2: 97% 100% 99%   Weight:    102.2 kg  Height:        Intake/Output Summary (Last 24 hours) at 07/29/2019 0829 Last data filed at 07/29/2019 0217 Gross per 24 hour  Intake 603 ml  Output 801 ml  Net -198 ml   Last 3 Weights 07/29/2019 07/28/2019 07/28/2019  Weight (lbs) 225 lb 6.4 oz 223 lb 4.8 oz 223 lb 4.8 oz  Weight (kg) 102.241 kg 101.288 kg 101.288 kg      Telemetry    Sinus bradycardia/sinus rhythm, no recurrence of atrial fibrillation - Personally Reviewed   Physical Exam  Alert, oriented, elderly obese woman in no distress GEN: No acute  distress.   Neck: No JVD Cardiac: RRR, 2/6 systolic murmur at the left lower sternal border Respiratory: Clear to auscultation bilaterally. GI: Soft, nontender, non-distended, obese MS: No edema; No deformity. Neuro:  Nonfocal  Psych: Normal affect   Labs    High Sensitivity Troponin:   Recent Labs  Lab 07/25/19 1805 07/25/19 2018 07/26/19 0431  TROPONINIHS 29* 30* 29*      Chemistry Recent Labs  Lab 07/27/19 0409 07/28/19 0323 07/29/19 0312  NA 137 137 138  K 4.7 4.8 4.4  CL 97* 101 97*  CO2 25 19* 27  GLUCOSE 225* 147* 171*  BUN 73* 93* 97*  CREATININE 2.48* 3.11* 3.28*  CALCIUM 8.6* 8.1* 8.3*  GFRNONAA 18* 13* 13*  GFRAA 21* 16* 15*  ANIONGAP 15 17* 14     Hematology Recent Labs  Lab 07/26/19 0431 07/27/19 0409 07/29/19 0312  WBC 6.2 6.3 5.7  RBC 3.95 3.80* 3.69*  HGB 11.2* 10.8* 10.6*  HCT 36.5 35.2* 34.3*  MCV 92.4 92.6 93.0  MCH 28.4 28.4 28.7  MCHC 30.7 30.7 30.9  RDW 16.5* 16.5* 16.5*  PLT 165 177 147*    BNP Recent Labs  Lab 07/25/19 1805  BNP 943.8*     DDimer No  results for input(s): DDIMER in the last 168 hours.   Radiology    Dg Chest 2 View  Result Date: 07/27/2019 CLINICAL DATA:  Shortness of breath EXAM: CHEST - 2 VIEW COMPARISON:  07/25/2019 FINDINGS: Cardiac shadow remains enlarged. Aortic calcifications are again noted. Changes of prior TAVR are again seen. Persistent small right pleural effusion is noted. Mild parenchymal opacity remains stable. No new focal abnormality is seen. IMPRESSION: Minimal small right pleural effusion. Mild lower lobe opacity is noted and stable. Electronically Signed   By: Inez Catalina M.D.   On: 07/27/2019 14:58    Cardiac Studies   TEE: FINDINGS:  LEFT VENTRICLE: EF reduced, visual estimate 35%. Global hypokinesis.  RIGHT VENTRICLE: Normal size and function.   LEFT ATRIUM: No thrombus/mass.  LEFT ATRIAL APPENDAGE: No thrombus/mass.   RIGHT ATRIUM: No thrombus/mass.  AORTIC  VALVE:  S/P TAVR. Valve leaflets well visualized, very trivial central regurgitation. No vegetation.  MITRAL VALVE: Moderately calcified annulus and calcified leaflets. Mild regurgitation. No vegetation.  TRICUSPID VALVE: Normal structure. Moderate regurgitation. No vegetation.  PULMONIC VALVE: Grossly normal structure. Trivial regurgitation. No apparent vegetation.  INTERATRIAL SEPTUM: Small PFO seen by color Doppler.  PERICARDIUM: Trivial effusion noted.  DESCENDING AORTA: Moderate diffuse plaque seen  Patient Profile     83 y.o. female with a hx of persistent atrial fibrillation, hypertension, obesity, diabetes, hyperlipidemia, anemia, aortic stenosis status post [id number])[id number] chronic diastolic heart failurewhowas seenfor the evaluation ofshortness of breath.  Assessment & Plan    1.  Acute respiratory failure with hypoxia: Multifactorial with obesity, COPD, and acute on chronic systolic heart failure.  2.  Persistent atrial fibrillation: Suspect this is the etiology of her worsening LV function and exacerbation of heart failure.  She is now status post TEE/cardioversion.  She will continue on apixaban 2.5 mg twice daily for a period of 1 month if tolerated.  I think she should have weekly CBCs to make sure that she is not becoming anemic as she has a history of profound anemia on anticoagulation in the past.  3.  Acute combined systolic and diastolic heart failure: No ACE/ARB/ARN I because of advanced kidney disease.  Avoid Aldactone for the same reason.  Continue beta-blockade.  Anticipate repeat echocardiogram in about 3 months.  4.  AKI on chronic kidney disease stage III.  Creatinine trending upward with increased from 3.11 yesterday to 3.28 today.  Otherwise patient is very stable.  I think discharge could be considered with a repeat metabolic panel on Monday.  I would hold her diuretics until she has a repeat metabolic panel.  I would be happy to arrange this at our  [place] office if she is discharged today.  Overall, the patient is clinically improved.  We discussed the need to continue apixaban 2.5 mg twice daily for 1 month without interruption.  Her hemoglobin will be followed closely as an outpatient.  In addition, her renal function needs close follow-up and if she is discharged today I am happy to arrange a follow-up metabolic panel on Monday.  Would hold diuretics until her follow-up labs are done.  For questions or updates, please contact [place] Please consult [email or web address] for contact info under     Signed, Sherren Mocha, MD  07/29/2019, 8:29 AM

## 2019-07-29 NOTE — Anesthesia Postprocedure Evaluation (Signed)
Anesthesia Post Note  Patient: Patricia [name]  Procedure(s) Performed: TRANSESOPHAGEAL ECHOCARDIOGRAM (TEE) (N/A ) CARDIOVERSION (N/A )     Patient location during evaluation: PACU Anesthesia Type: General Level of consciousness: awake and alert Pain management: pain level controlled Vital Signs Assessment: post-procedure vital signs reviewed and stable Respiratory status: spontaneous breathing, nonlabored ventilation and respiratory function stable Cardiovascular status: blood pressure returned to baseline and stable Postop Assessment: no apparent nausea or vomiting Anesthetic complications: no    Last Vitals:  Vitals:   07/29/19 0924 07/29/19 1220  BP:  (!) 128/59  Pulse:  (!) 57  Resp:  18  Temp:  36.6 C  SpO2: 100% 99%    Last Pain:  Vitals:   07/29/19 1220  TempSrc: Oral  PainSc:                  Audry Pili

## 2019-07-29 NOTE — Progress Notes (Signed)
Per d/w Dr. Burt Knack, BMET/CBC ordered for Monday 10/5 (I am out on Monday so put verbal with readback so that labs come back to his box). TOC f/u arranged with A. [facility] 10/13. I tried to see if this patient could have an appt with an APP they already established with but could not locate an appointment suitable in the necessary time frame. Angie is with Dr. Doug Sou care team. She also has f/u with Dr. Martinique 11/11 which we will keep. Appt info placed on AVS. Hiyab Nhem PA-C

## 2019-07-31 DIAGNOSIS — J449 Chronic obstructive pulmonary disease, unspecified: Secondary | ICD-10-CM | POA: Diagnosis not present

## 2019-08-01 ENCOUNTER — Other Ambulatory Visit: Payer: Self-pay

## 2019-08-01 DIAGNOSIS — I4819 Other persistent atrial fibrillation: Secondary | ICD-10-CM | POA: Diagnosis not present

## 2019-08-01 LAB — CBC
Hematocrit: 32.8 % — ABNORMAL LOW (ref 34.0–46.6)
Hemoglobin: 10.6 g/dL — ABNORMAL LOW (ref 11.1–15.9)
MCH: 28.1 pg (ref 26.6–33.0)
MCHC: 32.3 g/dL (ref 31.5–35.7)
MCV: 87 fL (ref 79–97)
Platelets: 151 10*3/uL (ref 150–450)
RBC: 3.77 x10E6/uL (ref 3.77–5.28)
RDW: 16.9 % — ABNORMAL HIGH (ref 11.7–15.4)
WBC: 4.6 10*3/uL (ref 3.4–10.8)

## 2019-08-01 NOTE — Telephone Encounter (Signed)
Patient contacted regarding discharge from [facility] on 07/29/2019.  Patient understands to follow up with provider Doreene Adas PA on 08/09/19 at 9:15am at [facility]. Patient understands discharge instructions? YES Patient understands medications and regiment? YES Patient understands to bring all medications to this visit? YES   Patient reports some SOB when moving around. She is using home O2 as directed. Reports her HR is good, does not feel like she is in AF. Advised to call us if symptoms worsen before her visit.

## 2019-08-01 NOTE — Telephone Encounter (Signed)
[id number]

## 2019-08-01 NOTE — Telephone Encounter (Signed)
° ° °  Please return call to patient °

## 2019-08-02 ENCOUNTER — Telehealth: Payer: Self-pay | Admitting: *Deleted

## 2019-08-02 ENCOUNTER — Other Ambulatory Visit: Payer: Self-pay

## 2019-08-02 DIAGNOSIS — I10 Essential (primary) hypertension: Secondary | ICD-10-CM

## 2019-08-02 DIAGNOSIS — I5033 Acute on chronic diastolic (congestive) heart failure: Secondary | ICD-10-CM

## 2019-08-02 DIAGNOSIS — I5031 Acute diastolic (congestive) heart failure: Secondary | ICD-10-CM

## 2019-08-02 DIAGNOSIS — D649 Anemia, unspecified: Secondary | ICD-10-CM

## 2019-08-02 DIAGNOSIS — N178 Other acute kidney failure: Secondary | ICD-10-CM

## 2019-08-02 DIAGNOSIS — Z992 Dependence on renal dialysis: Secondary | ICD-10-CM

## 2019-08-02 DIAGNOSIS — I4819 Other persistent atrial fibrillation: Secondary | ICD-10-CM

## 2019-08-02 LAB — BASIC METABOLIC PANEL
BUN/Creatinine Ratio: 29 — ABNORMAL HIGH (ref 12–28)
BUN: 55 mg/dL — ABNORMAL HIGH (ref 8–27)
CO2: 24 mmol/L (ref 20–29)
Calcium: 9.1 mg/dL (ref 8.7–10.3)
Chloride: 103 mmol/L (ref 96–106)
Creatinine, Ser: 1.87 mg/dL — ABNORMAL HIGH (ref 0.57–1.00)
GFR calc Af Amer: 29 mL/min/{1.73_m2} — ABNORMAL LOW (ref >59)
GFR calc non Af Amer: 25 mL/min/{1.73_m2} — ABNORMAL LOW (ref >59)
Glucose: 137 mg/dL — ABNORMAL HIGH (ref 65–99)
Potassium: 5 mmol/L (ref 3.5–5.2)
Sodium: 143 mmol/L (ref 134–144)

## 2019-08-02 NOTE — Progress Notes (Signed)
lasix °

## 2019-08-02 NOTE — Telephone Encounter (Signed)
I have made the 1st attempt to contact the patient or family member in charge, in order to follow up from recently being discharged from the hospital. I left a message on voicemail but I will make another attempt at a different time.  

## 2019-08-02 NOTE — Progress Notes (Signed)
cbc

## 2019-08-04 NOTE — Telephone Encounter (Signed)
I have made the 2nd attempt to contact the patient or family member in charge, in order to follow up from recently being discharged from the hospital. I left a message on voicemail but I will make another attempt at a different time.

## 2019-08-05 ENCOUNTER — Encounter (HOSPITAL_COMMUNITY): Payer: Medicare Other

## 2019-08-05 ENCOUNTER — Telehealth: Payer: Self-pay | Admitting: Cardiology

## 2019-08-05 NOTE — Telephone Encounter (Signed)
New Message  Pt c/o swelling: STAT is pt has developed SOB within 24 hours  1) How much weight have you gained and in what time span? Fluctuating by a pound   2) If swelling, where is the swelling located? Lower Leg, Ankle, and Stomach  3) Are you currently taking a fluid pill? Yes  4) Are you currently SOB? Yes  5) Do you have a log of your daily weights (if so, list)? N/A  6) Have you gained 3 pounds in a day or 5 pounds in a week? No  7) Have you traveled recently? No

## 2019-08-05 NOTE — Telephone Encounter (Signed)
Spoke with patient, she has been having dropping on O2 Sat to 83% with exertion and shortness of breath  just walking across room. Heart rate goes up to 79-80 with exertion. Legs swollen with trouble bending, abdominal swelling, weight stable. Blood pressure 122/52 HR 71. Dizzy and nauseated for several days. She does have dark stools but takes iron, no change there. Discussed with Dr Percival Spanish DOD and will have patient take extra Lasix 40 mg x 3 days, keep follow up and labs next week. If worse or no improvement go to ED. Advised patient, verbalized understanding.

## 2019-08-07 ENCOUNTER — Other Ambulatory Visit: Payer: Self-pay | Admitting: Nurse Practitioner

## 2019-08-07 DIAGNOSIS — I5032 Chronic diastolic (congestive) heart failure: Secondary | ICD-10-CM

## 2019-08-07 NOTE — Progress Notes (Signed)
Cardiology Office Note:    Date:  08/09/2019   ID:  Patricia [name], DOB 03-25-1936, MRN [id number]  PCP:  [name], NP  Cardiologist:  [name] Martinique, MD   Referring MD: [name], NP   Chief Complaint  Patient presents with  . Follow-up    Afib, CHF    History of Present Illness:    Patricia [name] is a 83 y.o. female with a hx of persistent atrial fibrillation, hypertension, obesity, diabetes mellitus, hyperlipidemia, anemia, aortic stenosis status post TAVR (2017), and chronic systolic and diastolic heart failure.  She was recently discharged from the hospital on 07/29/2019.  She was seen and evaluated for acute respiratory failure with hypoxia, which was felt to be multifactorial given his obesity, COPD, and acute on chronic systolic heart failure.  She was also found to be in atrial fibrillation which was suspected to be the etiology of her worsening LV function and exacerbation of heart failure.  She underwent TEE guided cardioversion on 07/28/2019 with successful conversion to normal sinus rhythm.  She is anticoagulated on apixaban 2.5 mg twice daily times at least 4 weeks.  She has a history of profound anemia on anticoagulation in the past.  She will need routine CBCs during her 4 weeks of anticoagulation.  Last echocardiogram on 07/27/2019 with an EF of 25 to 30%, LVH with diffuse hypokinesis with septal dyskinesis, severely dilated left atrium, mild mitral stenosis, and no significant aortic regurgitation. Will repeat an echo after three months of NSR.   She returns today for hospital follow up. She called our office on Friday with increased work of breathing and SOB. She was instructed to increase lasix to 120 mg daily, which she is still taking. She states this dose of lasix has helped her breathing but not necessarily her swelling. She denies palpitations and chest pain. I suspect she will need a regimen of increased lasix going forward.     Past Medical  History:  Diagnosis Date  . Allergy   . Anemia, unspecified   . Arthritis   . Asthma   . Atrial fibrillation status post cardioversion [facility]) 09/2015   s/p TEE/DCCV>>SR on amio  . Benign neoplasm of colon   . Carpal tunnel syndrome   . Cellulitis and abscess of finger, unspecified   . Cerumen impaction   . Cervicalgia   . CHF (congestive heart failure) ([place])   . Chronic airway obstruction, not elsewhere classified   . Chronic anticoagulation 09/2015   Eliquis  . Diarrhea   . Diffuse cystic mastopathy   . Edema   . Encounter for long-term (current) use of other medications   . GERD (gastroesophageal reflux disease)   . Heart murmur   . Lumbago   . Neck pain 05/23/2015  . Obesity, unspecified   . Other and unspecified hyperlipidemia   . Other psoriasis   . Pain in joint, lower leg   . PONV (postoperative nausea and vomiting)   . Primary localized osteoarthrosis of right shoulder 12/18/2015  . 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 Scoliosis (and kyphoscoliosis), idiopathic   . Shortness of breath dyspnea    with exertion  . Type II or unspecified type diabetes mellitus without mention of complication, uncontrolled   . Unspecified essential hypertension   . Unspecified hemorrhoids without mention of complication   . Unspecified hypothyroidism   . Urge  incontinence     Past Surgical History:  Procedure Laterality Date  . ABDOMINAL HYSTERECTOMY  1987   complete  . BREAST BIOPSY Right   . BREAST EXCISIONAL BIOPSY Left 2011  . BREAST EXCISIONAL BIOPSY Right    x2  . BREAST SURGERY     right breast 3 surgeries [phone]  . CARDIAC CATHETERIZATION N/A 09/02/2016   Procedure: Right/Left Heart Cath and Coronary Angiography;  Surgeon: [name] M Martinique, MD;  Location: [place] CV LAB;  Service: Cardiovascular;  Laterality: N/A;  . CARDIOVERSION N/A  10/19/2015   Procedure: CARDIOVERSION;  Surgeon: Lelon Perla, MD;  Location: [facility] ENDOSCOPY;  Service: Cardiovascular;  Laterality: N/A;  . CARDIOVERSION N/A 11/13/2017   Procedure: CARDIOVERSION;  Surgeon: Jerline Pain, MD;  Location: [facility] ENDOSCOPY;  Service: Cardiovascular;  Laterality: N/A;  . CARDIOVERSION N/A 07/28/2019   Procedure: CARDIOVERSION;  Surgeon: Buford Dresser, MD;  Location: [place];  Service: Cardiovascular;  Laterality: N/A;  . [place]  . COLON SURGERY     2004,2007,2013.  3 times colon surgieres  . ESOPHAGOGASTRODUODENOSCOPY (EGD) WITH PROPOFOL Left 03/13/2017   Procedure: ESOPHAGOGASTRODUODENOSCOPY (EGD) WITH PROPOFOL;  Surgeon: Ronnette Juniper, MD;  Location: [place];  Service: Gastroenterology;  Laterality: Left;  . ESOPHAGOGASTRODUODENOSCOPY (EGD) WITH PROPOFOL Left 01/22/2018   Procedure: ESOPHAGOGASTRODUODENOSCOPY (EGD) WITH PROPOFOL;  Surgeon: Arta Silence, MD;  Location: [facility] ENDOSCOPY;  Service: Endoscopy;  Laterality: Left;  . EXCISE LE MANDIBULAR LYMPH NODE T     DR C. NEWMAN  . FOOT SURGERY  1989  . GIVENS CAPSULE STUDY Left 01/23/2018   Procedure: GIVENS CAPSULE STUDY;  Surgeon: Wilford Corner, MD;  Location: [facility] ENDOSCOPY;  Service: Endoscopy;  Laterality: Left;  . LEFT SHOULDER ARTHROSCOPY    . 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 NEUROPLASTY / TRANSPOSITION MEDIAN NERVE AT CARPAL TUNNEL BILATERAL  05/2003  . TEE WITHOUT CARDIOVERSION N/A 10/19/2015   Procedure: Transesophageal Echocardiogram (TEE) ;  Surgeon: Lelon Perla, MD;  Location: [facility] ENDOSCOPY;  Service: Cardiovascular;  Laterality: N/A;  . TEE WITHOUT CARDIOVERSION N/A 10/14/2016   Procedure: TRANSESOPHAGEAL ECHOCARDIOGRAM (TEE);  Surgeon: Sherren Mocha, MD;  Location: [place];  Service: Open Heart Surgery;  Laterality: N/A;  . TEE WITHOUT CARDIOVERSION N/A 07/28/2019   Procedure: TRANSESOPHAGEAL ECHOCARDIOGRAM (TEE);  Surgeon: Buford Dresser, MD;   Location: [facility] ENDOSCOPY;  Service: Cardiovascular;  Laterality: N/A;  . TOTAL SHOULDER ARTHROPLASTY Right 12/18/2015   Procedure: TOTAL SHOULDER ARTHROPLASTY;  Surgeon: [name], MD;  Location: [place];  Service: Orthopedics;  Laterality: Right;  . TRANSCATHETER AORTIC VALVE REPLACEMENT, TRANSFEMORAL N/A 10/14/2016   Procedure: TRANSCATHETER AORTIC VALVE REPLACEMENT, TRANSFEMORAL;  Surgeon: Sherren Mocha, MD;  Location: [place];  Service: Open Heart Surgery;  Laterality: N/A;  . TRIGGER FINGER RELEASE Left   . VAGINAL CYST REMOVED  1967    Current Medications: Current Meds  Medication Sig  . acetaminophen (TYLENOL) 650 MG CR tablet Take 1,300 mg by mouth every 8 (eight) hours as needed for pain.  Marland Kitchen albuterol (PROVENTIL) (2.5 MG/3ML) 0.083% nebulizer solution USE 1 VIAL IN NEBULIZER EVERY 4 HOURS AND AS NEEDED. Generic: VENTOLIN (Patient taking differently: Take 2.5 mg by nebulization every 4 (four) hours as needed for wheezing or shortness of breath. )  . albuterol (VENTOLIN HFA) 108 (90 Base) MCG/ACT inhaler Inhale 2 puffs into the lungs every 6 (six) hours as needed for wheezing or shortness of breath.  Marland Kitchen amiodarone (PACERONE) 200 MG tablet Take 1 tablet (  200 mg total) by mouth daily.  Marland Kitchen apixaban (ELIQUIS) 2.5 MG TABS tablet Take 1 tablet (2.5 mg total) by mouth every 12 (twelve) hours.  . Blood Glucose Monitoring Suppl (ONE TOUCH ULTRA 2) w/Device KIT Use as instructed to test blood sugar twice daily DX: E11.9  . BREO ELLIPTA 200-25 MCG/INH AEPB INHALE 1 PUFF INTO THE LUNGS DAILY (Patient taking differently: Inhale 1 puff into the lungs daily. )  . cetirizine (ZYRTEC) 10 MG tablet Take 10 mg by mouth daily as needed for allergies.   . cholecalciferol (VITAMIN D) 1000 units tablet Take 1,000 Units by mouth at bedtime.   Marland Kitchen doxazosin (CARDURA) 4 MG tablet TAKE 1 TABLET BY MOUTH ONCE DAILY TO HELP CONTROL BLOOD PRESSURE (Patient taking differently: Take 4 mg by mouth daily before breakfast. TAKE  1 TABLET BY MOUTH ONCE DAILY TO HELP CONTROL BLOOD PRESSURE)  . ferrous sulfate 325 (65 FE) MG EC tablet Take 325 mg by mouth 2 (two) times daily.  . furosemide (LASIX) 40 MG tablet Take 2 tablets (80 mg total) by mouth every morning. Take an extra 40 mg tablet every Monday, Wednesday, and Friday at lunch time with one extra potassium tablet.  Marland Kitchen glucose blood (ONE TOUCH ULTRA TEST) test strip Use as instructed to test blood sugar twice daily DX: E11.9  . JANUVIA 50 MG tablet TAKE 1 TABLET(50 MG) BY MOUTH DAILY (Patient taking differently: Take 50 mg by mouth daily with breakfast. )  . Lancets (ONETOUCH ULTRASOFT) lancets Use as instructed to test blood sugar twice daily DX: E11.9  . levothyroxine (SYNTHROID) 88 MCG tablet TAKE 1 TABLET BY MOUTH DAILY ON AN EMPTY STOMACH (Patient taking differently: Take 88 mcg by mouth daily before breakfast. Take on an empty stomach)  . pantoprazole (PROTONIX) 40 MG tablet TAKE 1 TABLET(40 MG) BY MOUTH TWICE DAILY (Patient taking differently: Take 40 mg by mouth 2 (two) times daily. )  . potassium gluconate 595 (99 K) MG TABS tablet Take 595 mg by mouth 2 (two) times daily. Take one extra tablet on Monday, Wednesday, and Friday with extra 40 mg Lasix dose.  . rosuvastatin (CRESTOR) 10 MG tablet Take 1 tablet (10 mg total) by mouth daily. (Patient taking differently: Take 10 mg by mouth at bedtime. )  . triamcinolone (KENALOG) 0.025 % cream APPLY  CREAM TOPICALLY ONCE DAILY AS NEEDED FOR PSORIASIS (Patient taking differently: Apply 1 application topically daily as needed (psoriasis). )  . [DISCONTINUED] furosemide (LASIX) 40 MG tablet Take 40 mg by mouth 3 (three) times daily.     Allergies:   Codeine and Vesicare [solifenacin]   Social History   Socioeconomic History  . Marital status: Divorced    Spouse name: Not on file  . Number of children: 1  . Years of education: Not on file  . Highest education level: Not on file  Occupational History  . Occupation:  Retired Market researcher for [employer]  . Financial resource strain: Not hard at all  . Food insecurity    Worry: Never true    Inability: Never true  . Transportation needs    Medical: No    Non-medical: No  Tobacco Use  . Smoking status: Former Smoker    Packs/day: 1.00    Years: 40.00    Pack years: 40.00    Quit date: 09/07/1989    Years since quitting: 29.9  . Smokeless tobacco: Never Used  Substance and Sexual Activity  . Alcohol use: No  Alcohol/week: 0.0 standard drinks  . Drug use: No  . Sexual activity: Never  Lifestyle  . Physical activity    Days per week: 0 days    Minutes per session: 0 min  . Stress: To some extent  Relationships  . Social connections    Talks on phone: More than three times a week    Gets together: Once a week    Attends religious service: Never    Active member of club or organization: No    Attends meetings of clubs or organizations: Never    Relationship status: Widowed  Other Topics Concern  . Not on file  Social History Narrative   Her daughter & 3 grandchildren live in the area.       Family History: The patient's family history includes Arthritis in her sister; Asthma in her sister; Diabetes in her father; Heart disease in her brother and father; Liver cancer in her sister; Stroke in her father. There is no history of Breast cancer.  ROS:   Please see the history of present illness.     All other systems reviewed and are negative.  EKGs/Labs/Other Studies Reviewed:    The following studies were reviewed today:  TEE/DCCV 07/28/19: FINDINGS:  LEFT VENTRICLE: EF reduced, visual estimate 35%. Global hypokinesis.  RIGHT VENTRICLE: Normal size and function.   LEFT ATRIUM: No thrombus/mass.  LEFT ATRIAL APPENDAGE: No thrombus/mass.   RIGHT ATRIUM: No thrombus/mass.  AORTIC VALVE:  S/P TAVR. Valve leaflets well visualized, very trivial central regurgitation. No vegetation.  MITRAL VALVE:  Moderately calcified annulus and calcified leaflets. Mild regurgitation. No vegetation.  TRICUSPID VALVE: Normal structure. Moderate regurgitation. No vegetation.  PULMONIC VALVE: Grossly normal structure. Trivial regurgitation. No apparent vegetation.  INTERATRIAL SEPTUM: Small PFO seen by color Doppler.  PERICARDIUM: Trivial effusion noted.  DESCENDING AORTA: Moderate diffuse plaque seen   CARDIOVERSION:     Indications:  Symptomatic Atrial Fibrillation  Procedure Details:  Once the TEE was complete, the patient had the defibrillator pads placed in the anterior and posterior position. Once an appropriate level of sedation was confirmed, the patient was cardioverted x 1 with 150J of biphasic synchronized energy.  The patient converted to sinus bradycardia.  There were no apparent complications.  The patient had normal neuro status and respiratory status post procedure with vitals stable as recorded elsewhere.  Adequate airway was maintained throughout and vital signs monitored per protocol.  EKG:  EKG is  ordered today.  The ekg ordered today demonstrates sinus rhythm, HR 69, LBBB, PACs  Recent Labs: 12/02/2018: ALT 17 07/15/2019: Pro B Natriuretic peptide (BNP) 835.0 07/25/2019: B Natriuretic Peptide 943.8 07/26/2019: Magnesium 2.3; TSH 1.749 08/01/2019: BUN 55; Creatinine, Ser 1.87; Hemoglobin 10.6; Platelets 151; Potassium 5.0; Sodium 143  Recent Lipid Panel    Component Value Date/Time   CHOL 140 12/02/2018 0919   CHOL 182 04/28/2016 0940   TRIG 148 12/02/2018 0919   HDL 57 12/02/2018 0919   HDL 61 04/28/2016 0940   CHOLHDL 2.5 12/02/2018 0919   VLDL 19 06/11/2017 0832   LDLCALC 60 12/02/2018 0919    Physical Exam:    VS:  BP 136/60   Pulse 73   Temp 97.9 F (36.6 C)   Ht _0  (1.6 m)   Wt 218 lb 9.6 oz (99.2 kg)   SpO2 97%   BMI 38.72 kg/m     Wt Readings from Last 3 Encounters:  08/09/19 218 lb 9.6 oz (99.2 kg)  07/29/19 225 lb  6.4 oz (102.2 kg)   07/25/19 222 lb 12.8 oz (101.1 kg)     GEN: elderly obese female in no acute distress HEENT: Normal NECK: No JVD; No carotid bruits CARDIAC: RRR, systolic murmur 3/6 RESPIRATORY:  Respirations unlabored, occasional wheeze in bases ABDOMEN: Soft, non-tender, non-distended MUSCULOSKELETAL:  1+ B LE edema; No deformity  SKIN: Warm and dry NEUROLOGIC:  Alert and oriented x 3 PSYCHIATRIC:  Normal affect   ASSESSMENT:    1. Essential hypertension   2. Paroxysmal atrial fibrillation (HCC)   3. Aortic valve stenosis, etiology of cardiac valve disease unspecified   4. Chronic obstructive pulmonary disease, unspecified COPD type ([place])   5. Type 2 diabetes mellitus with hyperglycemia, without long-term current use of insulin (HCC)   6. Acute on chronic diastolic heart failure ([place])   7. Anemia, unspecified type    PLAN:    In order of problems listed above:  Chronic systolic and diastolic heart failure COPD Renal function was reduced at discharge and she was instructed to hold lasix. Repeat BMP with creatinine at baseline and she was instructed to resume home lasix. She is currently diuresing on lasix 120 mg daily with potassium gluconate 595 mg x 3 supplementation. I will check BMP today. I have instructed 80 mg lasix daily with potassium x 2 daily, with extra 40 mg lasix on MWF with extra potassium.  Repeat echo in 3 months.   Atrial fibrillation status post TEE guided cardioversion This patients CHA2DS2-VASc Score and unadjusted Ischemic Stroke Rate (% per year) is equal to 9.7 % stroke rate/year from a score of 6 (female, age4, CHF, CAD, DM).  She will require at least 4 weeks of AC post DCCV. Will check a CBC today given her history of anemia on anticoagulation. EKG today with sinus rhythm, LBBB (old).  She denies bleeding problems so far.    CKD stage III sCr at discharge was 3.28. Lasix were held at discharge. Repeat BMP with sCr 1.87, near her baseline. Dr. Burt Knack instructed her  to restart 80 mg lasix. Since then, she has increased her lasix and potassium to three times per day. Will repeat a BMP today. I will continue increased lasix regimen as above on MWF.    History of anemia on anticoagulation CBC at above  I will see her back in 3 weeks.   Medication Adjustments/Labs and Tests Ordered: Current medicines are reviewed at length with the patient today.  Concerns regarding medicines are outlined above.  Orders Placed This Encounter  Procedures  . Basic metabolic panel  . CBC  . EKG 12-Lead   Meds ordered this encounter  Medications  . furosemide (LASIX) 40 MG tablet    Sig: Take 2 tablets (80 mg total) by mouth every morning. Take an extra 40 mg tablet every Monday, Wednesday, and Friday at lunch time with one extra potassium tablet.    Dispense:  90 tablet    Refill:  3    Signed, Ledora Bottcher, [place]  08/09/2019 10:42 AM    [facility]

## 2019-08-09 ENCOUNTER — Ambulatory Visit: Payer: Medicare Other | Admitting: Physician Assistant

## 2019-08-09 ENCOUNTER — Other Ambulatory Visit: Payer: Self-pay

## 2019-08-09 ENCOUNTER — Encounter: Payer: Self-pay | Admitting: Physician Assistant

## 2019-08-09 VITALS — BP 136/60 | HR 73 | Temp 97.9°F | Ht 63.0 in | Wt 218.6 lb

## 2019-08-09 DIAGNOSIS — E1165 Type 2 diabetes mellitus with hyperglycemia: Secondary | ICD-10-CM | POA: Diagnosis not present

## 2019-08-09 DIAGNOSIS — I10 Essential (primary) hypertension: Secondary | ICD-10-CM | POA: Diagnosis not present

## 2019-08-09 DIAGNOSIS — I5033 Acute on chronic diastolic (congestive) heart failure: Secondary | ICD-10-CM

## 2019-08-09 DIAGNOSIS — J449 Chronic obstructive pulmonary disease, unspecified: Secondary | ICD-10-CM

## 2019-08-09 DIAGNOSIS — I350 Nonrheumatic aortic (valve) stenosis: Secondary | ICD-10-CM

## 2019-08-09 DIAGNOSIS — D649 Anemia, unspecified: Secondary | ICD-10-CM

## 2019-08-09 DIAGNOSIS — I480 Paroxysmal atrial fibrillation: Secondary | ICD-10-CM | POA: Diagnosis not present

## 2019-08-09 LAB — BASIC METABOLIC PANEL
BUN/Creatinine Ratio: 21 (ref 12–28)
BUN: 38 mg/dL — ABNORMAL HIGH (ref 8–27)
CO2: 31 mmol/L — ABNORMAL HIGH (ref 20–29)
Calcium: 8.6 mg/dL — ABNORMAL LOW (ref 8.7–10.3)
Chloride: 96 mmol/L (ref 96–106)
Creatinine, Ser: 1.82 mg/dL — ABNORMAL HIGH (ref 0.57–1.00)
GFR calc Af Amer: 30 mL/min/{1.73_m2} — ABNORMAL LOW (ref >59)
GFR calc non Af Amer: 26 mL/min/{1.73_m2} — ABNORMAL LOW (ref >59)
Glucose: 156 mg/dL — ABNORMAL HIGH (ref 65–99)
Potassium: 4 mmol/L (ref 3.5–5.2)
Sodium: 144 mmol/L (ref 134–144)

## 2019-08-09 LAB — CBC
Hematocrit: 32.1 % — ABNORMAL LOW (ref 34.0–46.6)
Hemoglobin: 10.3 g/dL — ABNORMAL LOW (ref 11.1–15.9)
MCH: 28 pg (ref 26.6–33.0)
MCHC: 32.1 g/dL (ref 31.5–35.7)
MCV: 87 fL (ref 79–97)
Platelets: 194 10*3/uL (ref 150–450)
RBC: 3.68 x10E6/uL — ABNORMAL LOW (ref 3.77–5.28)
RDW: 15.1 % (ref 11.7–15.4)
WBC: 6.6 10*3/uL (ref 3.4–10.8)

## 2019-08-09 MED ORDER — FUROSEMIDE 40 MG PO TABS: 80.0000 mg | ORAL_TABLET | ORAL | 3 refills | Status: DC

## 2019-08-09 NOTE — Patient Instructions (Signed)
Medication Instructions:   EVERYDAY: Take Furosemide (Lasix) 80 mg in the morning. Take Potassium 595 mg TWICE daily.  EVERY Monday, Wednesday, AND Friday: Take an extra 40 mg of Furosemide (Lasix) at lunch with one extra potassium tablet.   If you need a refill on your cardiac medications before your next appointment, please call your pharmacy.   Lab work: Your physician recommends that you return for lab work today: BMET, CBC  If you have labs (blood work) drawn today and your tests are completely normal, you will receive your results only by: Marland Kitchen MyChart Message (if you have MyChart) OR . A paper copy in the mail If you have any lab test that is abnormal or we need to change your treatment, we will call you to review the results.   Follow-Up: At [facility], you and your health needs are our priority.  As part of our continuing mission to provide you with exceptional heart care, we have created designated Provider Care Teams.  These Care Teams include your primary Cardiologist (physician) and Advanced Practice Providers (APPs -  Physician Assistants and Nurse Practitioners) who all work together to provide you with the care you need, when you need it. . Please follow-up with Fabian Sharp, [place] on Thursday, 08/25/19 at 10:45AM--IN OFFICE.

## 2019-08-23 ENCOUNTER — Encounter (INDEPENDENT_AMBULATORY_CARE_PROVIDER_SITE_OTHER): Payer: Self-pay

## 2019-08-23 NOTE — Progress Notes (Deleted)
Cardiology Office Note:    Date:  08/23/2019   ID:  Patricia [name], DOB [birth year], MRN [id number]  PCP:  [name], NP  Cardiologist:  [name] Martinique, MD   Referring MD: [name], NP   No chief complaint on file. ***  History of Present Illness:    Patricia [name] is a 83 y.o. female with a hx of persistent atrial fibrillation, hypertension, obesity, diabetes mellitus, hyperlipidemia, anemia, aortic stenosis status post TAVR (2017), and chronic systolic and diastolic heart failure.  She was recently discharged from the hospital on 07/29/2019.  She was seen and evaluated for acute respiratory failure with hypoxia, which was felt to be multifactorial given his obesity, COPD, and acute on chronic systolic heart failure.  She was also found to be in persistent atrial fibrillation which was suspected to be the etiology of her worsening LV function and exacerbation of heart failure.  She underwent TEE guided cardioversion on 07/28/2019 with successful conversion to normal sinus rhythm.  She is anticoagulated on apixaban 2.5 mg twice daily times at least 4 weeks.  She has a history of profound anemia on anticoagulation in the past.  She will need routine CBCs during her 4 weeks of anticoagulation.  Last echocardiogram on 07/27/2019 with an EF of 25 to 30%, LVH with diffuse hypokinesis with septal dyskinesis, severely dilated left atrium, mild mitral stenosis, and no significant aortic regurgitation. Will repeat an echo after three months of NSR.   I last saw her on 08/09/19 with complaints of continued increased work of breathing. Increased lasix of 120 mg daily helped her breathing but not her swelling. I instructed 80 mg daily lasix with an extra 40 mg on MWF.   She returns today for follow up.     Chronic systolic and diastolic heart failure She is currently diuresing on     Atrial fibrillation status post TEE guided cardioversion This patients CHA2DS2-VASc Score and  unadjusted Ischemic Stroke Rate (% per year) is equal to 9.7 % stroke rate/year from a score of 6 (female, age4, CHF, CAD, DM).  She will require at least 4 weeks of AC post DCCV. Will check a CBC today given her history of anemia on anticoagulation.     CKD stage III sCr at discharge was 3.28. Lasix were held at discharge. Repeat BMP with sCr 1.87, near her baseline. Dr. Burt Knack instructed her to restart 80 mg lasix. Will repeat a BMP today.    History of anemia on anticoagulation CBC at above      Past Medical History:  Diagnosis Date  . Allergy   . Anemia, unspecified   . Arthritis   . Asthma   . Atrial fibrillation status post cardioversion [facility]) 09/2015   s/p TEE/DCCV>>SR on amio  . Benign neoplasm of colon   . Carpal tunnel syndrome   . Cellulitis and abscess of finger, unspecified   . Cerumen impaction   . Cervicalgia   . CHF (congestive heart failure) ([place])   . Chronic airway obstruction, not elsewhere classified   . Chronic anticoagulation 09/2015   Eliquis  . Diarrhea   . Diffuse cystic mastopathy   . Edema   . Encounter for long-term (current) use of other medications   . GERD (gastroesophageal reflux disease)   . Heart murmur   . Lumbago   . Neck pain 05/23/2015  . Obesity, unspecified   . Other and unspecified hyperlipidemia   . Other psoriasis   . Pain in joint,  lower leg   . PONV (postoperative nausea and vomiting)   . Primary localized osteoarthrosis of right shoulder 12/18/2015  . 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 Scoliosis (and kyphoscoliosis), idiopathic   . Shortness of breath dyspnea    with exertion  . Type II or unspecified type diabetes mellitus without mention of complication, uncontrolled   . Unspecified essential hypertension   . Unspecified hemorrhoids without mention of complication   . Unspecified  hypothyroidism   . Urge incontinence     Past Surgical History:  Procedure Laterality Date  . ABDOMINAL HYSTERECTOMY  1987   complete  . BREAST BIOPSY Right   . BREAST EXCISIONAL BIOPSY Left 2011  . BREAST EXCISIONAL BIOPSY Right    x2  . BREAST SURGERY     right breast 3 surgeries [phone]  . CARDIAC CATHETERIZATION N/A 09/02/2016   Procedure: Right/Left Heart Cath and Coronary Angiography;  Surgeon: [name] M Martinique, MD;  Location: [place] CV LAB;  Service: Cardiovascular;  Laterality: N/A;  . CARDIOVERSION N/A 10/19/2015   Procedure: CARDIOVERSION;  Surgeon: Lelon Perla, MD;  Location: [facility] ENDOSCOPY;  Service: Cardiovascular;  Laterality: N/A;  . CARDIOVERSION N/A 11/13/2017   Procedure: CARDIOVERSION;  Surgeon: Jerline Pain, MD;  Location: [facility] ENDOSCOPY;  Service: Cardiovascular;  Laterality: N/A;  . CARDIOVERSION N/A 07/28/2019   Procedure: CARDIOVERSION;  Surgeon: Buford Dresser, MD;  Location: [place];  Service: Cardiovascular;  Laterality: N/A;  . [place]  . COLON SURGERY     2004,2007,2013.  3 times colon surgieres  . ESOPHAGOGASTRODUODENOSCOPY (EGD) WITH PROPOFOL Left 03/13/2017   Procedure: ESOPHAGOGASTRODUODENOSCOPY (EGD) WITH PROPOFOL;  Surgeon: Ronnette Juniper, MD;  Location: [place];  Service: Gastroenterology;  Laterality: Left;  . ESOPHAGOGASTRODUODENOSCOPY (EGD) WITH PROPOFOL Left 01/22/2018   Procedure: ESOPHAGOGASTRODUODENOSCOPY (EGD) WITH PROPOFOL;  Surgeon: Arta Silence, MD;  Location[facility] ENDOSCOPY;  Service: Endoscopy;  Laterality: Left;  . EXCISE LE MANDIBULAR LYMPH NODE T     DR C. NEWMAN  . FOOT SURGERY  1989  . GIVENS CAPSULE STUDY Left 01/23/2018   Procedure: GIVENS CAPSULE STUDY;  Surgeon: Wilford Corner, MD;  Location: [facility] ENDOSCOPY;  Service: Endoscopy;  Laterality: Left;  . LEFT SHOULDER ARTHROSCOPY    . 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 NEUROPLASTY / TRANSPOSITION MEDIAN NERVE AT CARPAL TUNNEL  BILATERAL  05/2003  . TEE WITHOUT CARDIOVERSION N/A 10/19/2015   Procedure: Transesophageal Echocardiogram (TEE) ;  Surgeon: Lelon Perla, MD;  Location: [facility] ENDOSCOPY;  Service: Cardiovascular;  Laterality: N/A;  . TEE WITHOUT CARDIOVERSION N/A 10/14/2016   Procedure: TRANSESOPHAGEAL ECHOCARDIOGRAM (TEE);  Surgeon: Sherren Mocha, MD;  Location: [place];  Service: Open Heart Surgery;  Laterality: N/A;  . TEE WITHOUT CARDIOVERSION N/A 07/28/2019   Procedure: TRANSESOPHAGEAL ECHOCARDIOGRAM (TEE);  Surgeon: Buford Dresser, MD;  Location: [facility] ENDOSCOPY;  Service: Cardiovascular;  Laterality: N/A;  . TOTAL SHOULDER ARTHROPLASTY Right 12/18/2015   Procedure: TOTAL SHOULDER ARTHROPLASTY;  Surgeon: [name], MD;  Location: [place];  Service: Orthopedics;  Laterality: Right;  . TRANSCATHETER AORTIC VALVE REPLACEMENT, TRANSFEMORAL N/A 10/14/2016   Procedure: TRANSCATHETER AORTIC VALVE REPLACEMENT, TRANSFEMORAL;  Surgeon: Sherren Mocha, MD;  Location: [place];  Service: Open Heart Surgery;  Laterality: N/A;  . TRIGGER FINGER RELEASE Left   . VAGINAL CYST REMOVED  1967    Current Medications: No outpatient medications have been marked  as taking for the 08/25/19 encounter (Appointment) with Ledora Bottcher, [place].     Allergies:   Codeine and Vesicare [solifenacin]   Social History   Socioeconomic History  . Marital status: Divorced    Spouse name: Not on file  . Number of children: 1  . Years of education: Not on file  . Highest education level: Not on file  Occupational History  . Occupation: Retired Market researcher for [employer]  . Financial resource strain: Not hard at all  . Food insecurity    Worry: Never true    Inability: Never true  . Transportation needs    Medical: No    Non-medical: No  Tobacco Use  . Smoking status: Former Smoker    Packs/day: 1.00    Years: 40.00    Pack years: 40.00    Quit date: 09/07/1989    Years since quitting: 29.9   . Smokeless tobacco: Never Used  Substance and Sexual Activity  . Alcohol use: No    Alcohol/week: 0.0 standard drinks  . Drug use: No  . Sexual activity: Never  Lifestyle  . Physical activity    Days per week: 0 days    Minutes per session: 0 min  . Stress: To some extent  Relationships  . Social connections    Talks on phone: More than three times a week    Gets together: Once a week    Attends religious service: Never    Active member of club or organization: No    Attends meetings of clubs or organizations: Never    Relationship status: Widowed  Other Topics Concern  . Not on file  Social History Narrative   Her daughter & 3 grandchildren live in the area.       Family History: The patient's ***family history includes Arthritis in her sister; Asthma in her sister; Diabetes in her father; Heart disease in her brother and father; Liver cancer in her sister; Stroke in her father. There is no history of Breast cancer.  ROS:   Please see the history of present illness.    *** All other systems reviewed and are negative.  EKGs/Labs/Other Studies Reviewed:    The following studies were reviewed today: ***  EKG:  EKG is *** ordered today.  The ekg ordered today demonstrates ***  Recent Labs: 12/02/2018: ALT 17 07/15/2019: Pro B Natriuretic peptide (BNP) 835.0 07/25/2019: B Natriuretic Peptide 943.8 07/26/2019: Magnesium 2.3; TSH 1.749 08/09/2019: BUN 38; Creatinine, Ser 1.82; Hemoglobin 10.3; Platelets 194; Potassium 4.0; Sodium 144  Recent Lipid Panel    Component Value Date/Time   CHOL 140 12/02/2018 0919   CHOL 182 04/28/2016 0940   TRIG 148 12/02/2018 0919   HDL 57 12/02/2018 0919   HDL 61 04/28/2016 0940   CHOLHDL 2.5 12/02/2018 0919   VLDL 19 06/11/2017 0832   LDLCALC 60 12/02/2018 0919    Physical Exam:    VS:  There were no vitals taken for this visit.    Wt Readings from Last 3 Encounters:  08/09/19 218 lb 9.6 oz (99.2 kg)  07/29/19 225 lb 6.4 oz  (102.2 kg)  07/25/19 222 lb 12.8 oz (101.1 kg)     GEN: *** Well nourished, well developed in no acute distress HEENT: Normal NECK: No JVD; No carotid bruits LYMPHATICS: No lymphadenopathy CARDIAC: ***RRR, no murmurs, rubs, gallops RESPIRATORY:  Clear to auscultation without rales, wheezing or rhonchi  ABDOMEN: Soft, non-tender, non-distended MUSCULOSKELETAL:  No edema; No  deformity  SKIN: Warm and dry NEUROLOGIC:  Alert and oriented x 3 PSYCHIATRIC:  Normal affect   ASSESSMENT:    No diagnosis found. PLAN:    In order of problems listed above:  No diagnosis found.   Medication Adjustments/Labs and Tests Ordered: Current medicines are reviewed at length with the patient today.  Concerns regarding medicines are outlined above.  No orders of the defined types were placed in this encounter.  No orders of the defined types were placed in this encounter.   Signed, Ledora Bottcher, PA  08/23/2019 4:15 PM    [facility]

## 2019-08-25 ENCOUNTER — Ambulatory Visit: Payer: Medicare Other | Admitting: Physician Assistant

## 2019-08-31 DIAGNOSIS — J449 Chronic obstructive pulmonary disease, unspecified: Secondary | ICD-10-CM | POA: Diagnosis not present

## 2019-09-01 NOTE — Progress Notes (Signed)
Cardiology Office Note   Date:  09/07/2019   ID:  JACYNDA BRUNKE, DOB [birth year], MRN [id number]  PCP:  [name], NP  Cardiologist:   Shell Yandow Martinique, MD   Chief Complaint  Patient presents with   Atrial Fibrillation   Congestive Heart Failure      History of Present Illness: LENEE FRANZE is a 83 y.o. female who is seen for follow up Afib.  She has a history of HTN, obesity, DM, HL, and anemia. She was admitted to the hospital in December 2016 with increased dyspnea. She was found to be in atrial fibrillation with RVR. She had pulmonary edema. Hgb was 10. Echo showed EF 55-60% with moderate to severe AS. She was anticoagulated with Xarelto and started on amiodarone. She had TEE guided DCCV with return to NSR.   On 12/18/15 she underwent right shoulder arthroplasty. Post op Hgb was 9.6. It subsequently dropped to 7.1. She was transfused. She has been on iron. Hgb improved to 10. She did have EGD and colonoscopy with both gastric and colonic polyps. No bleeding seen.   On follow up in October 2017 she noted symptoms of increased dyspnea.  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 Mild pulmonary HTN and elevated filling pressures. She was admitted in November with acute CHF and was diuresed. She was evaluated in valve clinic and underwent transfemoral TAVR on 10/14/16. Post op course was uncomplicated. Repeat Echo in January  Showed higher than expected AV prosthesis gradient felt to be related to vigorous LV systolic function and Valve/ patient mismatch. She has been on  Eliquis. She has noted moderate improvement in her breathing.   She has chronic COPD and bronchitis- followed by primary care and pulmonary.  In March 2018 she was noted to have progressive anemia with dark stools. Hgb down to 7.4. She was transfused and EGD showed multiple gastric polyps and some oozing AVMs that were cauterized. Once again she was  admitted in May with worsening anemia. 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 Transfused 1 units PRBCs. Plan for outpatient capsule endoscopy.   She was seen January 9 with complaints of cough that did not improve with stopping ACEi. Some pulmonary crackles noted at the time. Pulse rate 70 then. CT chest in December showed emphysema and periapical scarring but no acute infiltrates. Seen later in January with increased SOB and rapid HR. She was found to be in Afib. She was admitted and amiodarone dose increased she underwent successful DCCV. Continued on Eliquis.   She was admitted March 27-April 2 with abdominal pain and severe weakness. Hgb down to 4.2. Creatinine up to 2.34. Patient was admitted to the medical ward, she received 4 units of packed red blood cells transfusion and anticoagulation was held. Patient was given proton pump inhibitors for antiacid therapy. Her hemoglobin and hematocrit stabilized with a discharge hemoglobin of 8.4 and hematocrit 27.1. No further signs of bleeding. Further workup with upper endoscopy showed normal esophagus, erosive gastropathy, normal duodenum. CT of the abdomen with no source of bleeding. She underwent capsule endoscopy. This did not show any bleeding source.  She did require IV lasix for volume overload. Creatinine improved to 1.45. Hgb 11.3 on last evaluation. Given bleeding risk anticoagulation was discontinued.   She was recently discharged from the hospital on 07/29/2019.  She was seen and evaluated for acute respiratory failure with hypoxia, which was felt to  be multifactorial given his obesity, COPD, and acute on chronic systolic heart failure.  She was also found to be in atrial fibrillation which was suspected to be the etiology of her worsening LV function and exacerbation of heart failure.  She underwent TEE guided cardioversion on 07/28/2019 with successful conversion to normal sinus rhythm.  She is anticoagulated on apixaban 2.5 mg twice  daily times at least 4 weeks.  She has a history of profound anemia on anticoagulation in the past.  She will need routine CBCs during her 4 weeks of anticoagulation.  Last echocardiogram on 07/27/2019 with an EF of 25 to 30%, LVH with diffuse hypokinesis with septal dyskinesis, severely dilated left atrium, mild mitral stenosis, and no significant aortic regurgitation.   On follow up today she is doing well. Weight is down 14 lbs. She reports rhythm has been normal. Breathing is doing well but she is still on home oxygen. Mild edema in her feet. No chest pain or dizziness. She is walking a lot at home.     Past Medical History:  Diagnosis Date   Allergy    Anemia, unspecified    Arthritis    Asthma    Atrial fibrillation status post cardioversion [facility]) 09/2015   s/p TEE/DCCV>>SR on amio   Benign neoplasm of colon    Carpal tunnel syndrome    Cellulitis and abscess of finger, unspecified    Cerumen impaction    Cervicalgia    CHF (congestive heart failure) (HCC)    Chronic airway obstruction, not elsewhere classified    Chronic anticoagulation 09/2015   Eliquis   Diarrhea    Diffuse cystic mastopathy    Edema    Encounter for long-term (current) use of other medications    GERD (gastroesophageal reflux disease)    Heart murmur    Lumbago    Neck pain 05/23/2015   Obesity, unspecified    Other and unspecified hyperlipidemia    Other psoriasis    Pain in joint, lower leg    PONV (postoperative nausea and vomiting)    Primary localized osteoarthrosis of right shoulder 12/18/2015   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   23 mm Edwards Sapien 3 transcatheter heart valve placed via percutaneous left transfemoral approach   Scoliosis (and kyphoscoliosis), idiopathic    Shortness of breath dyspnea    with exertion   Type II or unspecified type diabetes mellitus without mention of complication, uncontrolled    Unspecified  essential hypertension    Unspecified hemorrhoids without mention of complication    Unspecified hypothyroidism    Urge incontinence     Past Surgical History:  Procedure Laterality Date   ABDOMINAL HYSTERECTOMY  1987   complete   BREAST BIOPSY Right    BREAST EXCISIONAL BIOPSY Left 2011   BREAST EXCISIONAL BIOPSY Right    x2   BREAST SURGERY     right breast 3 surgeries 1967,1986,2000   CARDIAC CATHETERIZATION N/A 09/02/2016   Procedure: Right/Left Heart Cath and Coronary Angiography;  Surgeon: Liesl Simons M Martinique, MD;  Location: [facility];  Service: Cardiovascular;  Laterality: N/A;   CARDIOVERSION N/A 10/19/2015   Procedure: CARDIOVERSION;  Surgeon: Lelon Perla, MD;  Location: [facility] ENDOSCOPY;  Service: Cardiovascular;  Laterality: N/A;   CARDIOVERSION N/A 11/13/2017   Procedure: CARDIOVERSION;  Surgeon: Jerline Pain, MD;  Location: [facility] ENDOSCOPY;  Service: Cardiovascular;  Laterality: N/A;   CARDIOVERSION N/A 07/28/2019   Procedure: CARDIOVERSION;  Surgeon: Buford Dresser, MD;  Location:  [facility] ENDOSCOPY;  Service: Cardiovascular;  Laterality: N/A;   CHOLECYSTECTOMY  1992   DR BOWMAN   COLON SURGERY     [phone].  3 times colon surgieres   ESOPHAGOGASTRODUODENOSCOPY (EGD) WITH PROPOFOL Left 03/13/2017   Procedure: ESOPHAGOGASTRODUODENOSCOPY (EGD) WITH PROPOFOL;  Surgeon: Ronnette Juniper, MD;  Location: [place];  Service: Gastroenterology;  Laterality: Left;   ESOPHAGOGASTRODUODENOSCOPY (EGD) WITH PROPOFOL Left 01/22/2018   Procedure: ESOPHAGOGASTRODUODENOSCOPY (EGD) WITH PROPOFOL;  Surgeon: Arta Silence, MD;  Location: [facility] ENDOSCOPY;  Service: Endoscopy;  Laterality: Left;   EXCISE LE MANDIBULAR LYMPH NODE T     DR C. 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   NEUROPLASTY / TRANSPOSITION MEDIAN NERVE AT CARPAL TUNNEL BILATERAL  05/2003   TEE WITHOUT CARDIOVERSION N/A 10/19/2015   Procedure: Transesophageal Echocardiogram (TEE) ;  Surgeon: Lelon Perla, MD;  Location: [facility] ENDOSCOPY;  Service: Cardiovascular;  Laterality: N/A;   TEE WITHOUT CARDIOVERSION N/A 10/14/2016   Procedure: TRANSESOPHAGEAL ECHOCARDIOGRAM (TEE);  Surgeon: Sherren Mocha, MD;  Location: [place];  Service: Open Heart Surgery;  Laterality: N/A;   TEE WITHOUT CARDIOVERSION N/A 07/28/2019   Procedure: TRANSESOPHAGEAL ECHOCARDIOGRAM (TEE);  Surgeon: Buford Dresser, MD;  Location: [facility] ENDOSCOPY;  Service: Cardiovascular;  Laterality: N/A;   TOTAL SHOULDER ARTHROPLASTY Right 12/18/2015   Procedure: TOTAL SHOULDER ARTHROPLASTY;  Surgeon: [name], MD;  Location: [place];  Service: Orthopedics;  Laterality: Right;   TRANSCATHETER AORTIC VALVE REPLACEMENT, TRANSFEMORAL N/A 10/14/2016   Procedure: TRANSCATHETER AORTIC VALVE REPLACEMENT, TRANSFEMORAL;  Surgeon: Sherren Mocha, MD;  Location: [place];  Service: Open Heart Surgery;  Laterality: N/A;   TRIGGER FINGER RELEASE Left    VAGINAL CYST REMOVED  1967     Current Outpatient Medications  Medication Sig Dispense Refill   acetaminophen (TYLENOL) 650 MG CR tablet Take 1,300 mg by mouth every 8 (eight) hours as needed for pain.     albuterol (PROVENTIL) (2.5 MG/3ML) 0.083% nebulizer solution USE 1 VIAL IN NEBULIZER EVERY 4 HOURS AND AS NEEDED. Generic: VENTOLIN (Patient taking differently: Take 2.5 mg by nebulization every 4 (four) hours as needed for wheezing or shortness of breath. ) 60 vial 10   albuterol (VENTOLIN HFA) 108 (90 Base) MCG/ACT inhaler Inhale 2 puffs into the lungs every 6 (six) hours as needed for wheezing or shortness of breath. 1 Inhaler 11   amiodarone (PACERONE) 200 MG tablet Take 1 tablet (200 mg total) by mouth daily. 30 tablet 0   Blood Glucose Monitoring  Suppl (ONE TOUCH ULTRA 2) w/Device KIT Use as instructed to test blood sugar twice daily DX: E11.9 1 each 0   BREO ELLIPTA 200-25 MCG/INH AEPB INHALE 1 PUFF INTO THE LUNGS DAILY (Patient taking differently: Inhale 1 puff into the lungs daily. ) 60 each 5   cetirizine (ZYRTEC) 10 MG tablet Take 10 mg by mouth daily as needed for allergies.      cholecalciferol (VITAMIN D) 1000 units tablet Take 1,000 Units by mouth at bedtime.      doxazosin (CARDURA) 4 MG tablet TAKE 1 TABLET BY MOUTH ONCE DAILY TO HELP CONTROL BLOOD PRESSURE (Patient taking differently: Take 4 mg by mouth daily before breakfast. TAKE 1 TABLET BY MOUTH ONCE DAILY TO HELP CONTROL BLOOD PRESSURE) 90 tablet 1  ferrous sulfate 325 (65 FE) MG EC tablet Take 325 mg by mouth 2 (two) times daily.     furosemide (LASIX) 40 MG tablet Take 2 tablets (80 mg total) by mouth every morning. Take an extra 40 mg tablet every Monday, Wednesday, and Friday at lunch time with one extra potassium tablet. 90 tablet 3   glucose blood (ONE TOUCH ULTRA TEST) test strip Use as instructed to test blood sugar twice daily DX: E11.9 100 each 1   JANUVIA 50 MG tablet TAKE 1 TABLET(50 MG) BY MOUTH DAILY (Patient taking differently: Take 50 mg by mouth daily with breakfast. ) 30 tablet 3   Lancets (ONETOUCH ULTRASOFT) lancets Use as instructed to test blood sugar twice daily DX: E11.9 100 each 1   levothyroxine (SYNTHROID) 88 MCG tablet TAKE 1 TABLET BY MOUTH DAILY ON AN EMPTY STOMACH (Patient taking differently: Take 88 mcg by mouth daily before breakfast. Take on an empty stomach) 90 tablet 1   pantoprazole (PROTONIX) 40 MG tablet TAKE 1 TABLET(40 MG) BY MOUTH TWICE DAILY (Patient taking differently: Take 40 mg by mouth 2 (two) times daily. ) 180 tablet 1   potassium gluconate 595 (99 K) MG TABS tablet Take 595 mg by mouth 2 (two) times daily. Take one extra tablet on Monday, Wednesday, and Friday with extra 40 mg Lasix dose.     rosuvastatin (CRESTOR)  10 MG tablet Take 1 tablet (10 mg total) by mouth daily. (Patient taking differently: Take 10 mg by mouth at bedtime. ) 90 tablet 3   triamcinolone (KENALOG) 0.025 % cream APPLY  CREAM TOPICALLY ONCE DAILY AS NEEDED FOR PSORIASIS (Patient taking differently: Apply 1 application topically daily as needed (psoriasis). ) 30 g 1   No current facility-administered medications for this visit.     Allergies:   Codeine and Vesicare [solifenacin]    Social History:  The patient  reports that she quit smoking about 30 years ago. She has a 40.00 pack-year smoking history. She has never used smokeless tobacco. She reports that she does not drink alcohol or use drugs.   Family History:  The patient's family history includes Arthritis in her sister; Asthma in her sister; Diabetes in her father; Heart disease in her brother and father; Liver cancer in her sister; Stroke in her father.    ROS:  Please see the history of present illness.   Otherwise, review of systems are positive for none.   All other systems are reviewed and negative.    PHYSICAL EXAM: VS:  BP (!) 130/58    Pulse 83    Temp (!) 97.1 F (36.2 C)    Ht _0  (1.6 m)    Wt 215 lb 4.8 oz (97.7 kg)    SpO2 91%    BMI 38.14 kg/m  , BMI Body mass index is 38.14 kg/m. GENERAL:  Chronically ill appearing obese WF in NAD. On oxygen. HEENT:  PERRL, EOMI, sclera are clear. Oropharynx is clear. NECK:  No jugular venous distention, carotid upstroke brisk and symmetric, no bruits, no thyromegaly or adenopathy LUNGS:  Clear to auscultation bilaterally CHEST:  Unremarkable HEART:  RRR,  PMI not displaced or sustained,S1 and S2 within normal limits, no S3, no S4: no clicks, no rubs, gr 2/6 systolic murmur RUSB.  ABD:  Soft, nontender. BS +, no masses or bruits. No hepatomegaly, no splenomegaly EXT:  2 + pulses throughout, tr-1+ pedal edema, no cyanosis no clubbing SKIN:  Warm and dry.  No rashes NEURO:  Alert and oriented x 3. Cranial nerves II  through XII intact. PSYCH:  Cognitively intact   EKG:  EKG is not ordered today.    Recent Labs: Lab Results  Component Value Date   WBC 6.6 08/09/2019   HGB 10.3 (L) 08/09/2019   HCT 32.1 (L) 08/09/2019   PLT 194 08/09/2019   GLUCOSE 156 (H) 08/09/2019   CHOL 140 12/02/2018   TRIG 148 12/02/2018   HDL 57 12/02/2018   LDLCALC 60 12/02/2018   ALT 17 12/02/2018   AST 17 12/02/2018   NA 144 08/09/2019   K 4.0 08/09/2019   CL 96 08/09/2019   CREATININE 1.82 (H) 08/09/2019   BUN 38 (H) 08/09/2019   CO2 31 (H) 08/09/2019   TSH 1.749 07/26/2019   INR 1.44 01/20/2018   HGBA1C 8.0 (H) 07/25/2019   MICROALBUR 6.3 06/11/2017      Lipid Panel    Component Value Date/Time   CHOL 140 12/02/2018 0919   CHOL 182 04/28/2016 0940   TRIG 148 12/02/2018 0919   HDL 57 12/02/2018 0919   HDL 61 04/28/2016 0940   CHOLHDL 2.5 12/02/2018 0919   VLDL 19 06/11/2017 0832   LDLCALC 60 12/02/2018 0919      Wt Readings from Last 3 Encounters:  09/07/19 215 lb 4.8 oz (97.7 kg)  08/09/19 218 lb 9.6 oz (99.2 kg)  07/29/19 225 lb 6.4 oz (102.2 kg)      Other studies Reviewed: Additional studies/ records that were reviewed today include:   Echo 10/14/17: Study Conclusions  - Left ventricle: The cavity size was normal. Wall thickness was   increased in a pattern of moderate LVH. Systolic function was   normal. The estimated ejection fraction was in the range of 60%   to 65%. Wall motion was normal; there were no regional wall   motion abnormalities. Diastolic dysfunction with elevated LV   filling pressure. - Aortic valve: s/p TAVR. No obstruction or paravalvular leak. Mean   gradient (S): 24 mm Hg. Peak gradient (S): 47 mm Hg. Valve area   (VTI): 1.19 cm^2. Valve area (Vmax): 1.09 cm^2. Valve area   (Vmean): 1.07 cm^2. - Mitral valve: Calcified annulus. Mildly thickened leaflets . Mild   stenosis. There was moderate regurgitation. - Left atrium: Severely dilated. - Right atrium:  Mildly dilated. - Tricuspid valve: There was mild regurgitation. - Pulmonary arteries: PA peak pressure: 51 mm Hg (S). - Inferior vena cava: The vessel was normal in size. The   respirophasic diameter changes were in the normal range (>= 50%),   consistent with normal central venous pressure.  Impressions:  - Compared to a prior study in 10/2016, the TAVR valve appears   stable.  TEE/DCCV 07/28/19: FINDINGS:  LEFT VENTRICLE: EFreduced, visual estimate 35%. Global hypokinesis.  RIGHT VENTRICLE: Normal size and function.   LEFT ATRIUM: No thrombus/mass.  LEFT ATRIAL APPENDAGE: No thrombus/mass.   RIGHT ATRIUM: No thrombus/mass.  AORTIC VALVE:S/P TAVR. Valve leaflets well visualized, very trivial centralregurgitation. No vegetation.  MITRAL VALVE:Moderately calcified annulus and calcified leaflets.Mildregurgitation. No vegetation.  TRICUSPID VALVE: Normal structure.Moderateregurgitation. No vegetation.  PULMONIC VALVE: Grossly normal structure.Trivialregurgitation. No apparent vegetation.  INTERATRIAL SEPTUM:SmallPFO seen by color Doppler.  PERICARDIUM:Trivialeffusion noted.  DESCENDING AORTA: Moderate diffuse plaque seen   CARDIOVERSION:  Indications:Symptomatic Atrial Fibrillation  Procedure Details:  Once the TEE was complete, the patient had the defibrillator pads placed in the anterior and posterior position. Once an appropriate level of sedation was confirmed, the patient was  cardioverted x1with 150J of biphasic synchronized energy. The patient converted to sinus bradycardia. There were no apparent complications. The patient had normal neuro status and respiratory status post procedure with vitals stable as recorded elsewhere. Adequate airway was maintained throughout and vital signs monitored per protocol.   ASSESSMENT AND PLAN:  1.  Atrial fibrillation. S/p DCCV in December 2016 and again January 2019. More recent  admission in early October with respiratory failure and recurrent Afib. S/p TEE guided DCCV. On Eliquis 2.5 mg bid but at high bleeding risk. [place] Vasc score of 6. Now on higher dose of amiodarone 200 mg daily. She is maintaining NSR on exam.   Based on the fact she has 3 GI bleeding episodes last year I do not think she is a candidate for long term anticoagulation.  Will DC Eliquis now.   2.  Severe Aortic stenosis. Echo in July 2017 showed progression with velocity > 4 cm/sec and mean gradient of 40 mm Hg. Now s/p TAVR in December 2017. Follow up Echo 12/18 with higher than expected gradient (mean gradient of 29 mm Hg) due to patient/ prosthetic mismatch with small annuls size and vigorous LV function. More recent Echo showed stable valve prosthesis.   3. Anemia Fe deficient with GI blood loss secondary to AVMS and erosive gastropathy. GI work up as noted. Risk of recurrent bleeding is high. no anticoagulation or antiplatelet therapy. Recent Hgb 11.3  4. Chronic kidney disease stage 3. stable  5. S/p right shoulder arthroplasty.   6. Acute on Chronic systolic/diastolic CHF. Weight is down considerably and she has trace edema today.  Would continue current therapy. Plan to repeat Echo  In 2 months.   7. HTN  8. DM type 2.  Managed by primary care.  9. COPD. On inhaler therapy per pulmonary.   I will follow up in 3 months.   Signed, Shamell Suarez Martinique, MD  09/07/2019 11:35 AM    [facility] [address] [place], [place], [address] Phone [phone], Fax [phone]

## 2019-09-07 ENCOUNTER — Encounter: Payer: Self-pay | Admitting: Cardiology

## 2019-09-07 ENCOUNTER — Ambulatory Visit: Payer: Medicare Other | Admitting: Cardiology

## 2019-09-07 ENCOUNTER — Other Ambulatory Visit: Payer: Self-pay

## 2019-09-07 VITALS — BP 130/58 | HR 83 | Temp 97.1°F | Ht 63.0 in | Wt 215.3 lb

## 2019-09-07 DIAGNOSIS — I5042 Chronic combined systolic (congestive) and diastolic (congestive) heart failure: Secondary | ICD-10-CM | POA: Diagnosis not present

## 2019-09-07 DIAGNOSIS — I350 Nonrheumatic aortic (valve) stenosis: Secondary | ICD-10-CM

## 2019-09-07 DIAGNOSIS — Z952 Presence of prosthetic heart valve: Secondary | ICD-10-CM | POA: Diagnosis not present

## 2019-09-07 DIAGNOSIS — I480 Paroxysmal atrial fibrillation: Secondary | ICD-10-CM

## 2019-09-07 NOTE — Patient Instructions (Signed)
Medication Instructions:  Continue same medications *If you need a refill on your cardiac medications before your next appointment, please call your pharmacy*  Lab Work: None ordered   Testing/Procedures: Schedule Echo  Follow-Up: At [facility], you and your health needs are our priority.  As part of our continuing mission to provide you with exceptional heart care, we have created designated Provider Care Teams.  These Care Teams include your primary Cardiologist (physician) and Advanced Practice Providers (APPs -  Physician Assistants and Nurse Practitioners) who all work together to provide you with the care you need, when you need it.  Your next appointment:  Monday 12/05/19 at 10:40 am   The format for your next appointment:  Office   Provider:  Dr.Jordan

## 2019-09-08 ENCOUNTER — Telehealth: Payer: Self-pay | Admitting: Cardiology

## 2019-09-08 NOTE — Telephone Encounter (Signed)
Returned call to pt she states that she was here yesterday and needs ECHO in 2 months and she did not pay attention when they were scheduling but they scheduled for 2 weeks so she needs to be rescheduled. Will send message to scheduling.

## 2019-09-08 NOTE — Telephone Encounter (Signed)
Patient would like to speak to Dr. Martinique nurse.

## 2019-09-14 DIAGNOSIS — H5201 Hypermetropia, right eye: Secondary | ICD-10-CM | POA: Diagnosis not present

## 2019-09-14 DIAGNOSIS — H35033 Hypertensive retinopathy, bilateral: Secondary | ICD-10-CM | POA: Diagnosis not present

## 2019-09-14 DIAGNOSIS — E113293 Type 2 diabetes mellitus with mild nonproliferative diabetic retinopathy without macular edema, bilateral: Secondary | ICD-10-CM | POA: Diagnosis not present

## 2019-09-14 DIAGNOSIS — H2513 Age-related nuclear cataract, bilateral: Secondary | ICD-10-CM | POA: Diagnosis not present

## 2019-09-14 DIAGNOSIS — H43813 Vitreous degeneration, bilateral: Secondary | ICD-10-CM | POA: Diagnosis not present

## 2019-09-15 ENCOUNTER — Other Ambulatory Visit: Payer: Self-pay | Admitting: Nurse Practitioner

## 2019-09-15 DIAGNOSIS — E785 Hyperlipidemia, unspecified: Secondary | ICD-10-CM

## 2019-09-15 DIAGNOSIS — I10 Essential (primary) hypertension: Secondary | ICD-10-CM

## 2019-09-20 ENCOUNTER — Other Ambulatory Visit (HOSPITAL_COMMUNITY): Payer: Medicare Other

## 2019-09-30 DIAGNOSIS — J449 Chronic obstructive pulmonary disease, unspecified: Secondary | ICD-10-CM | POA: Diagnosis not present

## 2019-10-04 ENCOUNTER — Other Ambulatory Visit: Payer: Self-pay | Admitting: Nurse Practitioner

## 2019-10-04 DIAGNOSIS — E1165 Type 2 diabetes mellitus with hyperglycemia: Secondary | ICD-10-CM

## 2019-10-12 ENCOUNTER — Encounter: Payer: Self-pay | Admitting: Nurse Practitioner

## 2019-10-12 ENCOUNTER — Ambulatory Visit (INDEPENDENT_AMBULATORY_CARE_PROVIDER_SITE_OTHER): Payer: Medicare Other | Admitting: Nurse Practitioner

## 2019-10-12 ENCOUNTER — Other Ambulatory Visit: Payer: Self-pay

## 2019-10-12 VITALS — BP 134/82 | HR 66 | Temp 96.9°F | Ht 63.0 in | Wt 213.0 lb

## 2019-10-12 DIAGNOSIS — R0602 Shortness of breath: Secondary | ICD-10-CM

## 2019-10-12 DIAGNOSIS — E1165 Type 2 diabetes mellitus with hyperglycemia: Secondary | ICD-10-CM | POA: Diagnosis not present

## 2019-10-12 DIAGNOSIS — N184 Chronic kidney disease, stage 4 (severe): Secondary | ICD-10-CM | POA: Diagnosis not present

## 2019-10-12 DIAGNOSIS — I10 Essential (primary) hypertension: Secondary | ICD-10-CM | POA: Diagnosis not present

## 2019-10-12 DIAGNOSIS — I5032 Chronic diastolic (congestive) heart failure: Secondary | ICD-10-CM

## 2019-10-12 DIAGNOSIS — E782 Mixed hyperlipidemia: Secondary | ICD-10-CM

## 2019-10-12 DIAGNOSIS — E039 Hypothyroidism, unspecified: Secondary | ICD-10-CM

## 2019-10-12 DIAGNOSIS — J069 Acute upper respiratory infection, unspecified: Secondary | ICD-10-CM

## 2019-10-12 MED ORDER — GLUCOSE BLOOD VI STRP: 1.0000 | ORAL_STRIP | Freq: Two times a day (BID) | 11 refills | Status: AC

## 2019-10-12 MED ORDER — ONETOUCH VERIO W/DEVICE KIT: PACK | 0 refills | Status: AC

## 2019-10-12 MED ORDER — ONETOUCH ULTRASOFT LANCETS MISC: 1.0000 | Freq: Two times a day (BID) | 11 refills | Status: AC

## 2019-10-12 NOTE — Progress Notes (Signed)
Careteam: Patient Care Team: [name], NP as PCP - General (Geriatric Medicine) Martinique, [name] M, MD as PCP - Cardiology (Cardiology) [name], MD as Consulting Physician (Orthopedic Surgery) Druscilla Brownie, MD as Consulting Physician (Dermatology) Ralene Bathe, MD as Consulting Physician (Ophthalmology)  Advanced Directive information Does Patient Have a Medical Advance Directive?: Yes, Type of Advance Directive: [place], Does patient want to make changes to medical advance directive?: No - Patient declined  Allergies  Allergen Reactions  . Codeine Other (See Comments)    "Spaces out" the patient and she cannot walk well  . Vesicare [Solifenacin] Itching    Chief Complaint  Patient presents with  . Hospitalization Follow-up    Seen in hospital 07/25/2019- 07/29/2019  . Medication Management    Albuterol Inhaler needs to say Proair in order for insurance to cover   . Quality Metric Gaps    Discuss need for foot and eye exam      HPI: Patient is a 83 y.o. female seen in the office today for hospital follow up and routine follow up.  It was recommended that she follow up with PCP after 7-10 days from her October hospital stay. She is here today. She has followed up with her cardiologist several times. She was noted to have acute CHF.  She is now on chronic O2.   echocardiogram on 07/27/2019 with an EF of 25 to 30%, LVH with diffuse hypokinesis with septal dyskinesis, severely dilated left atrium, mild mitral stenosis, and no significant aortic regurgitation. She last followed up with cardiology in November and was doing well. Weight and edema down. Breathing controlled. Continues to be doing good today.  DM- blood sugars can be high in the morning but does not have readings. Due for A1c on the 28th. Reports eye exam was done 1 month by Dr Mardene Speak. Declines foot exam today, reports she "checks her feet"  TSH- continues on  synthroid 88 mcg   Reports she does not feel well today because she has a cold. She denies any COVID exposure, no sick contacts. No loss of smell or taste. Chronic shortness of breath without worsening of this or chest congestion. Reports dry cough for 1 week.  Review of Systems:  Review of Systems  Constitutional: Positive for malaise/fatigue. Negative for chills and fever.  HENT: Positive for congestion. Negative for sore throat and tinnitus.   Respiratory: Positive for cough (dry cough). Negative for sputum production and shortness of breath.   Cardiovascular: Negative for chest pain, palpitations and leg swelling.  Gastrointestinal: Negative for constipation, diarrhea and heartburn.  Musculoskeletal: Negative for myalgias.  Neurological: Positive for tingling. Negative for dizziness and headaches.    Past Medical History:  Diagnosis Date  . Allergy   . Anemia, unspecified   . Arthritis   . Asthma   . Atrial fibrillation status post cardioversion [facility]) 09/2015   s/p TEE/DCCV>>SR on amio  . Benign neoplasm of colon   . Carpal tunnel syndrome   . Cellulitis and abscess of finger, unspecified   . Cerumen impaction   . Cervicalgia   . CHF (congestive heart failure) ([employer])   . Chronic airway obstruction, not elsewhere classified   . Chronic anticoagulation 09/2015   Eliquis  . Diarrhea   . Diffuse cystic mastopathy   . Edema   . Encounter for long-term (current) use of other medications   . GERD (gastroesophageal reflux disease)   . Heart murmur   . Lumbago   .  Neck pain 05/23/2015  . Obesity, unspecified   . Other and unspecified hyperlipidemia   . Other psoriasis   . Pain in joint, lower leg   . PONV (postoperative nausea and vomiting)   . Primary localized osteoarthrosis of right shoulder 12/18/2015  . 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  Scoliosis (and kyphoscoliosis), idiopathic   . Shortness of breath dyspnea    with exertion  . Type II or unspecified type diabetes mellitus without mention of complication, uncontrolled   . Unspecified essential hypertension   . Unspecified hemorrhoids without mention of complication   . Unspecified hypothyroidism   . Urge incontinence    Past Surgical History:  Procedure Laterality Date  . ABDOMINAL HYSTERECTOMY  1987   complete  . BREAST BIOPSY Right   . BREAST EXCISIONAL BIOPSY Left 2011  . BREAST EXCISIONAL BIOPSY Right    x2  . BREAST SURGERY     right breast 3 surgeries [phone]  . CARDIAC CATHETERIZATION N/A 09/02/2016   Procedure: Right/Left Heart Cath and Coronary Angiography;  Surgeon: [name] M Martinique, MD;  Location: [place] CV LAB;  Service: Cardiovascular;  Laterality: N/A;  . CARDIOVERSION N/A 10/19/2015   Procedure: CARDIOVERSION;  Surgeon: Lelon Perla, MD;  Location: [facility] ENDOSCOPY;  Service: Cardiovascular;  Laterality: N/A;  . CARDIOVERSION N/A 11/13/2017   Procedure: CARDIOVERSION;  Surgeon: Jerline Pain, MD;  Location: [facility] ENDOSCOPY;  Service: Cardiovascular;  Laterality: N/A;  . CARDIOVERSION N/A 07/28/2019   Procedure: CARDIOVERSION;  Surgeon: Buford Dresser, MD;  Location: [place];  Service: Cardiovascular;  Laterality: N/A;  . [place]  . COLON SURGERY     2004,2007,2013.  3 times colon surgieres  . ESOPHAGOGASTRODUODENOSCOPY (EGD) WITH PROPOFOL Left 03/13/2017   Procedure: ESOPHAGOGASTRODUODENOSCOPY (EGD) WITH PROPOFOL;  Surgeon: Ronnette Juniper, MD;  Location: [place];  Service: Gastroenterology;  Laterality: Left;  . ESOPHAGOGASTRODUODENOSCOPY (EGD) WITH PROPOFOL Left 01/22/2018   Procedure: ESOPHAGOGASTRODUODENOSCOPY (EGD) WITH PROPOFOL;  Surgeon: Arta Silence, MD;  Location: [facility] ENDOSCOPY;  Service: Endoscopy;  Laterality: Left;  . EXCISE LE MANDIBULAR LYMPH NODE T     DR C. NEWMAN  . FOOT SURGERY  1989   . GIVENS CAPSULE STUDY Left 01/23/2018   Procedure: GIVENS CAPSULE STUDY;  Surgeon: Wilford Corner, MD;  Location: [facility] ENDOSCOPY;  Service: Endoscopy;  Laterality: Left;  . LEFT SHOULDER ARTHROSCOPY    . 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 NEUROPLASTY / TRANSPOSITION MEDIAN NERVE AT CARPAL TUNNEL BILATERAL  05/2003  . TEE WITHOUT CARDIOVERSION N/A 10/19/2015   Procedure: Transesophageal Echocardiogram (TEE) ;  Surgeon: Lelon Perla, MD;  Location: [facility] ENDOSCOPY;  Service: Cardiovascular;  Laterality: N/A;  . TEE WITHOUT CARDIOVERSION N/A 10/14/2016   Procedure: TRANSESOPHAGEAL ECHOCARDIOGRAM (TEE);  Surgeon: Sherren Mocha, MD;  Location: [place];  Service: Open Heart Surgery;  Laterality: N/A;  . TEE WITHOUT CARDIOVERSION N/A 07/28/2019   Procedure: TRANSESOPHAGEAL ECHOCARDIOGRAM (TEE);  Surgeon: Buford Dresser, MD;  Location: [facility] ENDOSCOPY;  Service: Cardiovascular;  Laterality: N/A;  . TOTAL SHOULDER ARTHROPLASTY Right 12/18/2015   Procedure: TOTAL SHOULDER ARTHROPLASTY;  Surgeon: [name], MD;  Location: [place];  Service: Orthopedics;  Laterality: Right;  . TRANSCATHETER AORTIC VALVE REPLACEMENT, TRANSFEMORAL N/A 10/14/2016   Procedure: TRANSCATHETER AORTIC VALVE REPLACEMENT, TRANSFEMORAL;  Surgeon: Sherren Mocha, MD;  Location: [place];  Service: Open Heart Surgery;  Laterality: N/A;  .  TRIGGER FINGER RELEASE Left   . VAGINAL CYST REMOVED  1967   Social History:   reports that she quit smoking about 30 years ago. She has a 40.00 pack-year smoking history. She has never used smokeless tobacco. She reports that she does not drink alcohol or use drugs.  Family History  Problem Relation Age of Onset  . Diabetes Father   . Stroke Father   . Heart disease Father   . Liver cancer Sister   . Heart disease Brother   . Asthma Sister   . Arthritis Sister   . Breast cancer Neg Hx     Medications: Patient's Medications  New Prescriptions   No medications on file  Previous  Medications   ACETAMINOPHEN (TYLENOL) 650 MG CR TABLET    Take 1,300 mg by mouth every 8 (eight) hours as needed for pain.   ALBUTEROL (PROVENTIL) (2.5 MG/3ML) 0.083% NEBULIZER SOLUTION    USE 1 VIAL IN NEBULIZER EVERY 4 HOURS AND AS NEEDED. Generic: VENTOLIN   ALBUTEROL (VENTOLIN HFA) 108 (90 BASE) MCG/ACT INHALER    Inhale 2 puffs into the lungs every 6 (six) hours as needed for wheezing or shortness of breath.   AMIODARONE (PACERONE) 200 MG TABLET    Take 1 tablet (200 mg total) by mouth daily.   BLOOD GLUCOSE MONITORING SUPPL (ONE TOUCH ULTRA 2) W/DEVICE KIT    Use as instructed to test blood sugar twice daily DX: E11.9   BREO ELLIPTA 200-25 MCG/INH AEPB    INHALE 1 PUFF INTO THE LUNGS DAILY   CETIRIZINE (ZYRTEC) 10 MG TABLET    Take 10 mg by mouth daily as needed for allergies.    CHOLECALCIFEROL (VITAMIN D) 1000 UNITS TABLET    Take 1,000 Units by mouth at bedtime.    DOXAZOSIN (CARDURA) 4 MG TABLET    TAKE 1 TABLET BY MOUTH EVERY DAY TO HELP CONTROL BLOOD PRESSURE   FERROUS SULFATE 325 (65 FE) MG EC TABLET    Take 325 mg by mouth 2 (two) times daily.   FUROSEMIDE (LASIX) 40 MG TABLET    Take 2 tablets (80 mg total) by mouth every morning. Take an extra 40 mg tablet every Monday, Wednesday, and Friday at lunch time with one extra potassium tablet.   GLUCOSE BLOOD (ONE TOUCH ULTRA TEST) TEST STRIP    Use as instructed to test blood sugar twice daily DX: E11.9   JANUVIA 50 MG TABLET    TAKE 1 TABLET(50 MG) BY MOUTH DAILY   LANCETS (ONETOUCH ULTRASOFT) LANCETS    Use as instructed to test blood sugar twice daily DX: E11.9   LEVOTHYROXINE (SYNTHROID) 88 MCG TABLET    TAKE 1 TABLET BY MOUTH DAILY ON AN EMPTY STOMACH   PANTOPRAZOLE (PROTONIX) 40 MG TABLET    TAKE 1 TABLET(40 MG) BY MOUTH TWICE DAILY   POTASSIUM GLUCONATE 595 (99 K) MG TABS TABLET    Take 595 mg by mouth 2 (two) times daily. Take one extra tablet on Monday, Wednesday, and Friday with extra 40 mg Lasix dose.   ROSUVASTATIN (CRESTOR)  10 MG TABLET    TAKE 1 TABLET(10 MG) BY MOUTH DAILY  Modified Medications   No medications on file  Discontinued Medications   TRIAMCINOLONE (KENALOG) 0.025 % CREAM    APPLY  CREAM TOPICALLY ONCE DAILY AS NEEDED FOR PSORIASIS    Physical Exam:  Vitals:   10/12/19 1318  BP: 134/82  Pulse: 66  Temp: (!) 96.9 F (36.1 C)  TempSrc:  Temporal  SpO2: 95%  Weight: 213 lb (96.6 kg)  Height: _0  (1.6 m)   Body mass index is 37.73 kg/m. Wt Readings from Last 3 Encounters:  10/12/19 213 lb (96.6 kg)  09/07/19 215 lb 4.8 oz (97.7 kg)  08/09/19 218 lb 9.6 oz (99.2 kg)    Physical Exam Constitutional:      Appearance: Normal appearance.  HENT:     Head: Normocephalic and atraumatic.  Cardiovascular:     Rate and Rhythm: Normal rate and regular rhythm.     Heart sounds: Murmur present.  Pulmonary:     Effort: Pulmonary effort is normal.     Breath sounds: Normal breath sounds.  Abdominal:     General: Bowel sounds are normal. There is no distension.     Palpations: Abdomen is soft.  Musculoskeletal:        General: No swelling.  Skin:    General: Skin is warm and dry.  Neurological:     General: No focal deficit present.     Mental Status: She is alert and oriented to person, place, and time.  Psychiatric:        Mood and Affect: Mood normal.        Behavior: Behavior normal.     Labs reviewed: Basic Metabolic Panel: Recent Labs    12/02/18 0919 07/26/19 0431 07/29/19 0312 08/01/19 0938 08/09/19 1044  NA 140 138 138 143 144  K 4.6 4.9 4.4 5.0 4.0  CL 99 99 97* 103 96  CO2 _1 31*  GLUCOSE 178* 259* 171* 137* 156*  BUN 44* 62* 97* 55* 38*  CREATININE 1.54* 2.45* 3.28* 1.87* 1.82*  CALCIUM 9.3 8.9 8.3* 9.1 8.6*  MG  --  2.3  --   --   --   TSH 5.58* 1.749  --   --   --    Liver Function Tests: Recent Labs    12/02/18 0919  AST 17  ALT 17  BILITOT 0.4  PROT 6.8   No results for input(s): LIPASE, AMYLASE in the last 8760 hours. No results for  input(s): AMMONIA in the last 8760 hours. CBC: Recent Labs    12/02/18 0919 07/25/19 1805 07/29/19 0312 08/01/19 0943 08/09/19 1044  WBC 5.6 6.2 5.7 4.6 6.6  NEUTROABS 3,702 5.0  --   --   --   HGB 10.7* 10.9* 10.6* 10.6* 10.3*  HCT 33.0* 36.4 34.3* 32.8* 32.1*  MCV 89.9 94.8 93.0 87 87  PLT 214 166 147* 151 194   Lipid Panel: Recent Labs    12/02/18 0919  CHOL 140  HDL 57  LDLCALC 60  TRIG 148  CHOLHDL 2.5   TSH: Recent Labs    12/02/18 0919 07/26/19 0431  TSH 5.58* 1.749   A1C: Lab Results  Component Value Date   HGBA1C 8.0 (H) 07/25/2019     Assessment/Plan 1. Shortness of breath Ongoing, without worsening of symptoms. Pt with hx of COPD and CHF which contributes.    2. CKD (chronic kidney disease) stage 4, GFR 15-29 ml/min (HCC) -upcoming appt with nephrologist. Encourage proper hydration and to avoid NSAIDS (Aleve, Advil, Motrin, Ibuprofen)   3. Essential hypertension Controlled on current regimen to continue current therapy with low sodium diet.   4. Type 2 diabetes mellitus with hyperglycemia, without long-term current use of insulin ([place]) -pt reports elevated blood sugars but did not bring readings to office today. She declines foot exam and states we will do it  next time. Encouraged her to keep log of blood sugars and bring to follow up. We will follow up in 1 month on her blood sugar readings. In the meantime Encouraged dietary compliance, routine foot care/monitoring and to keep up with diabetic eye exams through ophthalmology- needs to make follow up.  - discussed with the patient the pathophysiology of diabetes and the natural progression of the disease.  -stressed the importance of lifestyle changes including diet and exercise. -discussed complications associated with diabetes including retinopathy, nephropathy, neuropathy as well as increased risk of cardiovascular disease. We went over the benefit seen with glycemic control.   5. Hypothyroidism,  unspecified type TSH stable on last lab, continues on synthroid 88 mcg.   6. Mixed hyperlipidemia -continues on crestor 10 mg daily LDL at goal in feb, will need recheck with next lab work- not fasting today.  7. Chronic diastolic congestive heart failure (HCC) Stable, close follow up with cardiology she has done well since hospitalization and weight is down. She is still requiring O2 but without worsening shortness of breath, edema, or weight gain.   8. URI Declines COVID testing, however will monitor and notify us for any changes such as loss of smell or taste, worsening shortness of breath, cough, or fever  Next appt: 4 weeks for Diabetes. Labs prior to visit.  Carlos American. Amado, [facility] [phone]

## 2019-10-24 ENCOUNTER — Other Ambulatory Visit: Payer: Self-pay | Admitting: *Deleted

## 2019-10-24 MED ORDER — AMIODARONE HCL 200 MG PO TABS: 200.0000 mg | ORAL_TABLET | Freq: Every day | ORAL | 2 refills | Status: DC

## 2019-10-27 DIAGNOSIS — D500 Iron deficiency anemia secondary to blood loss (chronic): Secondary | ICD-10-CM | POA: Diagnosis not present

## 2019-10-31 ENCOUNTER — Telehealth: Payer: Self-pay | Admitting: *Deleted

## 2019-10-31 DIAGNOSIS — J449 Chronic obstructive pulmonary disease, unspecified: Secondary | ICD-10-CM

## 2019-10-31 MED ORDER — ALBUTEROL SULFATE HFA 108 (90 BASE) MCG/ACT IN AERS: 2.0000 | INHALATION_SPRAY | Freq: Four times a day (QID) | RESPIRATORY_TRACT | 3 refills | Status: DC | PRN

## 2019-10-31 NOTE — Telephone Encounter (Signed)
Patient called and stated that she was seen in September and needed a ProAir Inhaler called into pharmacy. Stated that her Ventolin is not covered by her insurance. Please Advise.

## 2019-10-31 NOTE — Telephone Encounter (Signed)
Order sent to pharmacy

## 2019-11-04 ENCOUNTER — Other Ambulatory Visit: Payer: Medicare Other

## 2019-11-07 ENCOUNTER — Other Ambulatory Visit: Payer: Self-pay

## 2019-11-07 ENCOUNTER — Ambulatory Visit (HOSPITAL_COMMUNITY): Payer: Medicare Other | Attending: Cardiovascular Disease

## 2019-11-07 ENCOUNTER — Other Ambulatory Visit: Payer: Medicare Other

## 2019-11-07 DIAGNOSIS — I350 Nonrheumatic aortic (valve) stenosis: Secondary | ICD-10-CM | POA: Diagnosis not present

## 2019-11-07 DIAGNOSIS — E782 Mixed hyperlipidemia: Secondary | ICD-10-CM | POA: Diagnosis not present

## 2019-11-07 DIAGNOSIS — Z952 Presence of prosthetic heart valve: Secondary | ICD-10-CM

## 2019-11-07 DIAGNOSIS — I480 Paroxysmal atrial fibrillation: Secondary | ICD-10-CM

## 2019-11-07 DIAGNOSIS — E1165 Type 2 diabetes mellitus with hyperglycemia: Secondary | ICD-10-CM | POA: Diagnosis not present

## 2019-11-07 DIAGNOSIS — I5042 Chronic combined systolic (congestive) and diastolic (congestive) heart failure: Secondary | ICD-10-CM

## 2019-11-07 DIAGNOSIS — E039 Hypothyroidism, unspecified: Secondary | ICD-10-CM | POA: Diagnosis not present

## 2019-11-07 DIAGNOSIS — N184 Chronic kidney disease, stage 4 (severe): Secondary | ICD-10-CM | POA: Diagnosis not present

## 2019-11-08 LAB — COMPLETE METABOLIC PANEL WITH GFR
AG Ratio: 1.3 (calc) (ref 1.0–2.5)
ALT: 15 U/L (ref 6–29)
AST: 16 U/L (ref 10–35)
Albumin: 4.1 g/dL (ref 3.6–5.1)
Alkaline phosphatase (APISO): 53 U/L (ref 37–153)
BUN/Creatinine Ratio: 25 (calc) — ABNORMAL HIGH (ref 6–22)
BUN: 42 mg/dL — ABNORMAL HIGH (ref 7–25)
CO2: 34 mmol/L — ABNORMAL HIGH (ref 20–32)
Calcium: 9.2 mg/dL (ref 8.6–10.4)
Chloride: 94 mmol/L — ABNORMAL LOW (ref 98–110)
Creat: 1.7 mg/dL — ABNORMAL HIGH (ref 0.60–0.88)
GFR, Est African American: 32 mL/min/{1.73_m2} — ABNORMAL LOW (ref >=60)
GFR, Est Non African American: 28 mL/min/{1.73_m2} — ABNORMAL LOW (ref >=60)
Globulin: 3.1 g/dL (calc) (ref 1.9–3.7)
Glucose, Bld: 253 mg/dL — ABNORMAL HIGH (ref 65–99)
Potassium: 4.3 mmol/L (ref 3.5–5.3)
Sodium: 138 mmol/L (ref 135–146)
Total Bilirubin: 0.7 mg/dL (ref 0.2–1.2)
Total Protein: 7.2 g/dL (ref 6.1–8.1)

## 2019-11-08 LAB — CBC WITH DIFFERENTIAL/PLATELET
Absolute Monocytes: 566 cells/uL (ref 200–950)
Basophils Absolute: 28 cells/uL (ref 0–200)
Basophils Relative: 0.5 %
Eosinophils Absolute: 101 cells/uL (ref 15–500)
Eosinophils Relative: 1.8 %
HCT: 30.6 % — ABNORMAL LOW (ref 35.0–45.0)
Hemoglobin: 9.7 g/dL — ABNORMAL LOW (ref 11.7–15.5)
Lymphs Abs: 924 cells/uL (ref 850–3900)
MCH: 28 pg (ref 27.0–33.0)
MCHC: 31.7 g/dL — ABNORMAL LOW (ref 32.0–36.0)
MCV: 88.2 fL (ref 80.0–100.0)
MPV: 11.8 fL (ref 7.5–12.5)
Monocytes Relative: 10.1 %
Neutro Abs: 3982 cells/uL (ref 1500–7800)
Neutrophils Relative %: 71.1 %
Platelets: 169 10*3/uL (ref 140–400)
RBC: 3.47 10*6/uL — ABNORMAL LOW (ref 3.80–5.10)
RDW: 15.2 % — ABNORMAL HIGH (ref 11.0–15.0)
Total Lymphocyte: 16.5 %
WBC: 5.6 10*3/uL (ref 3.8–10.8)

## 2019-11-08 LAB — LIPID PANEL
Cholesterol: 156 mg/dL (ref <200)
HDL: 73 mg/dL (ref >=50)
LDL Cholesterol (Calc): 61 mg/dL (calc)
Non-HDL Cholesterol (Calc): 83 mg/dL (calc) (ref <130)
Total CHOL/HDL Ratio: 2.1 (calc) (ref <5.0)
Triglycerides: 138 mg/dL (ref <150)

## 2019-11-08 LAB — TSH: TSH: 7.59 mIU/L — ABNORMAL HIGH (ref 0.40–4.50)

## 2019-11-08 LAB — HEMOGLOBIN A1C
Hgb A1c MFr Bld: 9.1 % of total Hgb — ABNORMAL HIGH (ref <5.7)
Mean Plasma Glucose: 214 (calc)
eAG (mmol/L): 11.9 (calc)

## 2019-11-09 ENCOUNTER — Ambulatory Visit (INDEPENDENT_AMBULATORY_CARE_PROVIDER_SITE_OTHER): Payer: Medicare Other | Admitting: Nurse Practitioner

## 2019-11-09 ENCOUNTER — Encounter: Payer: Self-pay | Admitting: Nurse Practitioner

## 2019-11-09 ENCOUNTER — Other Ambulatory Visit: Payer: Self-pay

## 2019-11-09 ENCOUNTER — Other Ambulatory Visit: Payer: Self-pay | Admitting: Nurse Practitioner

## 2019-11-09 VITALS — BP 122/72 | HR 61 | Temp 97.1°F | Ht 63.0 in | Wt 207.6 lb

## 2019-11-09 DIAGNOSIS — D649 Anemia, unspecified: Secondary | ICD-10-CM | POA: Diagnosis not present

## 2019-11-09 DIAGNOSIS — E039 Hypothyroidism, unspecified: Secondary | ICD-10-CM

## 2019-11-09 DIAGNOSIS — J449 Chronic obstructive pulmonary disease, unspecified: Secondary | ICD-10-CM

## 2019-11-09 DIAGNOSIS — E1165 Type 2 diabetes mellitus with hyperglycemia: Secondary | ICD-10-CM

## 2019-11-09 DIAGNOSIS — N184 Chronic kidney disease, stage 4 (severe): Secondary | ICD-10-CM

## 2019-11-09 MED ORDER — GLIMEPIRIDE 1 MG PO TABS: 1.0000 mg | ORAL_TABLET | Freq: Every day | ORAL | 1 refills | Status: DC

## 2019-11-09 NOTE — Progress Notes (Signed)
Careteam: Patient Care Team: [name], NP as PCP - General (Geriatric Medicine) Martinique, [name] M, MD as PCP - Cardiology (Cardiology) [name], MD as Consulting Physician (Orthopedic Surgery) Druscilla Brownie, MD as Consulting Physician (Dermatology) Ralene Bathe, MD as Consulting Physician (Ophthalmology)  Advanced Directive information Does Patient Have a Medical Advance Directive?: Yes, Type of Advance Directive: [place], Does patient want to make changes to medical advance directive?: No - Patient declined  Allergies  Allergen Reactions  . Codeine Other (See Comments)    "Spaces out" the patient and she cannot walk well  . Vesicare [Solifenacin] Itching    Chief Complaint  Patient presents with  . Medical Management of Chronic Issues    4-week followup of diabetes and review recent labs.  . Immunizations    Discuss need for TD/TDaP  . Quality Metric Gaps    Discuss need for eye exam. Foot exam due.     HPI: Patient is a 84 y.o. female for follow up on diabetes and labs.  Reports diabetes has never been this bad. When she was hospitalized in October her glimepiride 2 mg daily was changed to januvia 50 mg daily. Blood sugars fasting in the last month 159-210, was eating a lot of sandwiches now making low sodium soup. Denies numbness and tingling in her feet. Needs to get daughter to cut her toes.  No hypoglycemic episodes.  Hyperlipidemia- LDL at goal on crestor 10 mg daily  COPD- continues on 2L [place]. Some days worse than others. Continues on breo ellipta   CHF- weight stable. No edema or swelling noted. Continues on lasix 40 mg daily.   CKD- following with nephrologist. Had to cancel last appt. Recent labs review.   Following with ophthalmology for cataracts has had routine eye exams.   Plans to get COVID vaccine, looked online but there was not an appt available.   Review of Systems:  Review of Systems  Constitutional:  Negative for chills, fever and weight loss.  HENT: Negative for tinnitus.   Respiratory: Positive for shortness of breath (some days worse than others, mask makes it worse). Negative for cough and sputum production.   Cardiovascular: Negative for chest pain, palpitations and leg swelling.  Gastrointestinal: Negative for abdominal pain, constipation, diarrhea and heartburn.  Genitourinary: Negative for dysuria, frequency and urgency.  Musculoskeletal: Negative for back pain, falls, joint pain and myalgias.  Skin: Negative.   Neurological: Negative for dizziness and headaches.  Psychiatric/Behavioral: Negative for depression and memory loss. The patient is not nervous/anxious and does not have insomnia.     Past Medical History:  Diagnosis Date  . Allergy   . Anemia, unspecified   . Arthritis   . Asthma   . Atrial fibrillation status post cardioversion [facility]) 09/2015   s/p TEE/DCCV>>SR on amio  . Benign neoplasm of colon   . Carpal tunnel syndrome   . Cellulitis and abscess of finger, unspecified   . Cerumen impaction   . Cervicalgia   . CHF (congestive heart failure) ([place])   . Chronic airway obstruction, not elsewhere classified   . Chronic anticoagulation 09/2015   Eliquis  . Diarrhea   . Diffuse cystic mastopathy   . Edema   . Encounter for long-term (current) use of other medications   . GERD (gastroesophageal reflux disease)   . Heart murmur   . Lumbago   . Neck pain 05/23/2015  . Obesity, unspecified   . Other and unspecified hyperlipidemia   .  Other psoriasis   . Pain in joint, lower leg   . PONV (postoperative nausea and vomiting)   . Primary localized osteoarthrosis of right shoulder 12/18/2015  . 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 Scoliosis (and kyphoscoliosis), idiopathic   . Shortness of breath dyspnea    with exertion  . Type II or  unspecified type diabetes mellitus without mention of complication, uncontrolled   . Unspecified essential hypertension   . Unspecified hemorrhoids without mention of complication   . Unspecified hypothyroidism   . Urge incontinence    Past Surgical History:  Procedure Laterality Date  . ABDOMINAL HYSTERECTOMY  1987   complete  . BREAST BIOPSY Right   . BREAST EXCISIONAL BIOPSY Left 2011  . BREAST EXCISIONAL BIOPSY Right    x2  . BREAST SURGERY     right breast 3 surgeries [phone]  . CARDIAC CATHETERIZATION N/A 09/02/2016   Procedure: Right/Left Heart Cath and Coronary Angiography;  Surgeon: [name] M Martinique, MD;  Location: [place] CV LAB;  Service: Cardiovascular;  Laterality: N/A;  . CARDIOVERSION N/A 10/19/2015   Procedure: CARDIOVERSION;  Surgeon: Lelon Perla, MD;  Location: [facility] ENDOSCOPY;  Service: Cardiovascular;  Laterality: N/A;  . CARDIOVERSION N/A 11/13/2017   Procedure: CARDIOVERSION;  Surgeon: Jerline Pain, MD;  Location: [facility] ENDOSCOPY;  Service: Cardiovascular;  Laterality: N/A;  . CARDIOVERSION N/A 07/28/2019   Procedure: CARDIOVERSION;  Surgeon: Buford Dresser, MD;  Location: [place];  Service: Cardiovascular;  Laterality: N/A;  . [place]  . COLON SURGERY     2004,2007,2013.  3 times colon surgieres  . ESOPHAGOGASTRODUODENOSCOPY (EGD) WITH PROPOFOL Left 03/13/2017   Procedure: ESOPHAGOGASTRODUODENOSCOPY (EGD) WITH PROPOFOL;  Surgeon: Ronnette Juniper, MD;  Location: [place];  Service: Gastroenterology;  Laterality: Left;  . ESOPHAGOGASTRODUODENOSCOPY (EGD) WITH PROPOFOL Left 01/22/2018   Procedure: ESOPHAGOGASTRODUODENOSCOPY (EGD) WITH PROPOFOL;  Surgeon: Arta Silence, MD;  Location: [facility] ENDOSCOPY;  Service: Endoscopy;  Laterality: Left;  . EXCISE LE MANDIBULAR LYMPH NODE T     DR C. NEWMAN  . FOOT SURGERY  1989  . GIVENS CAPSULE STUDY Left 01/23/2018   Procedure: GIVENS CAPSULE STUDY;  Surgeon: Wilford Corner, MD;   Location: [facility] ENDOSCOPY;  Service: Endoscopy;  Laterality: Left;  . LEFT SHOULDER ARTHROSCOPY    . 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 NEUROPLASTY / TRANSPOSITION MEDIAN NERVE AT CARPAL TUNNEL BILATERAL  05/2003  . TEE WITHOUT CARDIOVERSION N/A 10/19/2015   Procedure: Transesophageal Echocardiogram (TEE) ;  Surgeon: Lelon Perla, MD;  Location: [facility] ENDOSCOPY;  Service: Cardiovascular;  Laterality: N/A;  . TEE WITHOUT CARDIOVERSION N/A 10/14/2016   Procedure: TRANSESOPHAGEAL ECHOCARDIOGRAM (TEE);  Surgeon: Sherren Mocha, MD;  Location: [place];  Service: Open Heart Surgery;  Laterality: N/A;  . TEE WITHOUT CARDIOVERSION N/A 07/28/2019   Procedure: TRANSESOPHAGEAL ECHOCARDIOGRAM (TEE);  Surgeon: Buford Dresser, MD;  Location: [facility] ENDOSCOPY;  Service: Cardiovascular;  Laterality: N/A;  . TOTAL SHOULDER ARTHROPLASTY Right 12/18/2015   Procedure: TOTAL SHOULDER ARTHROPLASTY;  Surgeon: [name], MD;  Location: [place];  Service: Orthopedics;  Laterality: Right;  . TRANSCATHETER AORTIC VALVE REPLACEMENT, TRANSFEMORAL N/A 10/14/2016   Procedure: TRANSCATHETER AORTIC VALVE REPLACEMENT, TRANSFEMORAL;  Surgeon: Sherren Mocha, MD;  Location: [place];  Service: Open Heart Surgery;  Laterality: N/A;  . TRIGGER FINGER RELEASE Left   . VAGINAL CYST REMOVED  1967   Social History:  reports that she quit smoking about 30 years ago. She has a 40.00 pack-year smoking history. She has never used smokeless tobacco. She reports that she does not drink alcohol or use drugs.  Family History  Problem Relation Age of Onset  . Diabetes Father   . Stroke Father   . Heart disease Father   . Liver cancer Sister   . Heart disease Brother   . Asthma Sister   . Arthritis Sister   . Breast cancer Neg Hx     Medications: Patient's Medications  New Prescriptions   No medications on file  Previous Medications   ACETAMINOPHEN (TYLENOL) 650 MG CR TABLET    Take 1,300 mg by mouth every 8 (eight) hours as needed  for pain.   ALBUTEROL (PROVENTIL) (2.5 MG/3ML) 0.083% NEBULIZER SOLUTION    USE 1 VIAL IN NEBULIZER EVERY 4 HOURS AND AS NEEDED. Generic: VENTOLIN   ALBUTEROL (VENTOLIN HFA) 108 (90 BASE) MCG/ACT INHALER    Inhale 2 puffs into the lungs every 6 (six) hours as needed for wheezing or shortness of breath.   AMIODARONE (PACERONE) 200 MG TABLET    Take 1 tablet (200 mg total) by mouth daily.   BLOOD GLUCOSE MONITORING SUPPL (ONETOUCH VERIO) W/DEVICE KIT    Check blood sugar twice daily DX:E11.9   BREO ELLIPTA 200-25 MCG/INH AEPB    INHALE 1 PUFF INTO THE LUNGS DAILY   CETIRIZINE (ZYRTEC) 10 MG TABLET    Take 10 mg by mouth daily as needed for allergies.    CHOLECALCIFEROL (VITAMIN D) 1000 UNITS TABLET    Take 1,000 Units by mouth at bedtime.    DOXAZOSIN (CARDURA) 4 MG TABLET    TAKE 1 TABLET BY MOUTH EVERY DAY TO HELP CONTROL BLOOD PRESSURE   FERROUS SULFATE 325 (65 FE) MG EC TABLET    Take 325 mg by mouth 2 (two) times daily.   FUROSEMIDE (LASIX) 40 MG TABLET    Take 2 tablets (80 mg total) by mouth every morning. Take an extra 40 mg tablet every Monday, Wednesday, and Friday at lunch time with one extra potassium tablet.   GLUCOSE BLOOD TEST STRIP    1 each by Other route 2 (two) times daily. OneTouch Verio DX E11.8   JANUVIA 50 MG TABLET    TAKE 1 TABLET(50 MG) BY MOUTH DAILY   LANCETS (ONETOUCH ULTRASOFT) LANCETS    1 each by Other route 2 (two) times daily. OneTouch Verio DX E11.9   LEVOTHYROXINE (SYNTHROID) 88 MCG TABLET    TAKE 1 TABLET BY MOUTH DAILY ON AN EMPTY STOMACH   PANTOPRAZOLE (PROTONIX) 40 MG TABLET    TAKE 1 TABLET(40 MG) BY MOUTH TWICE DAILY   POTASSIUM GLUCONATE 595 (99 K) MG TABS TABLET    Take 595 mg by mouth 2 (two) times daily. Take one extra tablet on Monday, Wednesday, and Friday with extra 40 mg Lasix dose.   ROSUVASTATIN (CRESTOR) 10 MG TABLET    TAKE 1 TABLET(10 MG) BY MOUTH DAILY  Modified Medications   No medications on file  Discontinued Medications   No medications  on file    Physical Exam:  Vitals:   11/09/19 1313  BP: 122/72  Pulse: 61  Temp: (!) 97.1 F (36.2 C)  TempSrc: Temporal  SpO2: 93%  Weight: 207 lb 9.6 oz (94.2 kg)  Height: _0  (1.6 m)   Body mass index is 36.77 kg/m. Wt Readings from Last 3 Encounters:  11/09/19 207 lb 9.6 oz (94.2  kg)  10/12/19 213 lb (96.6 kg)  09/07/19 215 lb 4.8 oz (97.7 kg)    Physical Exam Constitutional:      General: She is not in acute distress.    Appearance: She is well-developed. She is not diaphoretic.  HENT:     Head: Normocephalic and atraumatic.  Eyes:     Conjunctiva/sclera: Conjunctivae normal.     Pupils: Pupils are equal, round, and reactive to light.  Cardiovascular:     Rate and Rhythm: Normal rate and regular rhythm.     Heart sounds: Normal heart sounds.  Pulmonary:     Effort: Pulmonary effort is normal.     Breath sounds: Decreased breath sounds (diminished throughout) present.  Abdominal:     General: Bowel sounds are normal.     Palpations: Abdomen is soft.  Musculoskeletal:        General: No tenderness.     Cervical back: Normal range of motion and neck supple.  Skin:    General: Skin is warm and dry.  Neurological:     Mental Status: She is alert and oriented to person, place, and time.     Labs reviewed: Basic Metabolic Panel: Recent Labs    12/02/18 0919 07/26/19 0431 08/01/19 0938 08/09/19 1044 11/07/19 0926  NA 140 138 143 144 138  K 4.6 4.9 5.0 4.0 4.3  CL 99 99 103 96 94*  CO2 _0 31* 34*  GLUCOSE 178* 259* 137* 156* 253*  BUN 44* 62* 55* 38* 42*  CREATININE 1.54* 2.45* 1.87* 1.82* 1.70*  CALCIUM 9.3 8.9 9.1 8.6* 9.2  MG  --  2.3  --   --   --   TSH 5.58* 1.749  --   --  7.59*   Liver Function Tests: Recent Labs    12/02/18 0919 11/07/19 0926  AST 17 16  ALT 17 15  BILITOT 0.4 0.7  PROT 6.8 7.2   No results for input(s): LIPASE, AMYLASE in the last 8760 hours. No results for input(s): AMMONIA in the last 8760  hours. CBC: Recent Labs    12/02/18 0919 07/25/19 1805 08/01/19 0943 08/09/19 1044 11/07/19 0926  WBC 5.6 6.2 4.6 6.6 5.6  NEUTROABS 3,702 5.0  --   --  3,982  HGB 10.7* 10.9* 10.6* 10.3* 9.7*  HCT 33.0* 36.4 32.8* 32.1* 30.6*  MCV 89.9 94.8 87 87 88.2  PLT 214 166 151 194 169   Lipid Panel: Recent Labs    12/02/18 0919 11/07/19 0926  CHOL 140 156  HDL 57 73  LDLCALC 60 61  TRIG 148 138  CHOLHDL 2.5 2.1   TSH: Recent Labs    12/02/18 0919 07/26/19 0431 11/07/19 0926  TSH 5.58* 1.749 7.59*   A1C: Lab Results  Component Value Date   HGBA1C 9.1 (H) 11/07/2019     Assessment/Plan 1. Chronic obstructive pulmonary disease, unspecified COPD type ([place]) -increase in shortness of breath yesterday but this has improved today. Reports it is harder to breath with the mask on. Continues on 2L [place] O2 levels at home from 92-98%.   2. CKD (chronic kidney disease) stage 4, GFR 15-29 ml/min (HCC) -stable on recent labs. Encourage proper hydration and to avoid NSAIDS (Aleve, Advil, Motrin, Ibuprofen) and other nephrotoxic drugs. To renally adjust medications as indicated.   3. Type 2 diabetes mellitus with hyperglycemia, without long-term current use of insulin (HCC) -uncontrolled -glimepiride stopped in the hospital and januvia 50 mg started. Since then blood sugars have not  been controlled. Diet modifications discussed.  - will ADD glimepiride (AMARYL) 1 MG tablet; Take 1 tablet (1 mg total) by mouth daily with breakfast.  Dispense: 30 tablet; Refill: 1 -continue to follow blood sugars.  - Hemoglobin A1c; Future - BMP with eGFR; Future  4. Hypothyroidism, unspecified type -not consistent with taking synthroid -educated to take it rather than missing multiple doses. - TSH; Future  5. Anemia, unspecified type -stable. ontinues on iron supplement.  - CBC with Differential/Platelet; Future   Next appt: 3 months with lab work before  [employer]. [place], [facility] [phone]

## 2019-11-09 NOTE — Patient Instructions (Addendum)
ADD glimepiride 1 mg daily  Continue on januvia 50 mg daily  Cut back on carbohydrates- try low carb wraps.

## 2019-11-30 DIAGNOSIS — H2513 Age-related nuclear cataract, bilateral: Secondary | ICD-10-CM | POA: Diagnosis not present

## 2019-11-30 DIAGNOSIS — H43813 Vitreous degeneration, bilateral: Secondary | ICD-10-CM | POA: Diagnosis not present

## 2019-11-30 DIAGNOSIS — H11151 Pinguecula, right eye: Secondary | ICD-10-CM | POA: Diagnosis not present

## 2019-11-30 DIAGNOSIS — H35033 Hypertensive retinopathy, bilateral: Secondary | ICD-10-CM | POA: Diagnosis not present

## 2019-12-01 DIAGNOSIS — J449 Chronic obstructive pulmonary disease, unspecified: Secondary | ICD-10-CM | POA: Diagnosis not present

## 2019-12-03 NOTE — Progress Notes (Signed)
Cardiology Office Note   Date:  12/05/2019   ID:  Aamori, Mcmasters 12-02-1935, MRN [id number]  PCP:  [name], NP  Cardiologist:   Torez Beauregard Martinique, MD   Chief Complaint  Patient presents with  . Atrial Fibrillation  . Congestive Heart Failure      History of Present Illness: Patricia [name] is a 84 y.o. female who is seen for follow up Afib.  She has a history of HTN, obesity, DM, HL, and anemia. She was admitted to the hospital in December 2016 with increased dyspnea. She was found to be in atrial fibrillation with RVR. She had pulmonary edema. Hgb was 10. Echo showed EF 55-60% with moderate to severe AS. She was anticoagulated with Xarelto and started on amiodarone. She had TEE guided DCCV with return to NSR.   On 12/18/15 she underwent right shoulder arthroplasty. Post op Hgb was 9.6. It subsequently dropped to 7.1. She was transfused. She has been on iron. Hgb improved to 10. She did have EGD and colonoscopy with both gastric and colonic polyps. No bleeding seen.   On follow up in October 2017 she noted symptoms of increased dyspnea.  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 Mild pulmonary HTN and elevated filling pressures. She was admitted in November with acute CHF and was diuresed. She was evaluated in valve clinic and underwent transfemoral TAVR on 10/14/16. Post op course was uncomplicated. Repeat Echo in January  Showed higher than expected AV prosthesis gradient felt to be related to vigorous LV systolic function and Valve/ patient mismatch. She has been on  Eliquis. She has noted moderate improvement in her breathing.   She has chronic COPD and bronchitis- followed by primary care and pulmonary.  In March 2018 she was noted to have progressive anemia with dark stools. Hgb down to 7.4. She was transfused and EGD showed multiple gastric polyps and some oozing AVMs that were cauterized. Once again she was  admitted in May with worsening anemia. 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 Transfused 1 units PRBCs. Plan for outpatient capsule endoscopy.   She was seen January 2019 with complaints of cough that did not improve with stopping ACEi. Some pulmonary crackles noted at the time. Pulse rate 70 then. CT chest in December showed emphysema and periapical scarring but no acute infiltrates. Seen later in January with increased SOB and rapid HR. She was found to be in Afib. She was admitted and amiodarone dose increased she underwent successful DCCV. Continued on Eliquis.   She was admitted March 27-April 2 with abdominal pain and severe weakness. Hgb down to 4.2. Creatinine up to 2.34. Patient was admitted to the medical ward, she received 4 units of packed red blood cells transfusion and anticoagulation was held. Patient was given proton pump inhibitors for antiacid therapy. Her hemoglobin and hematocrit stabilized with a discharge hemoglobin of 8.4 and hematocrit 27.1. No further signs of bleeding. Further workup with upper endoscopy showed normal esophagus, erosive gastropathy, normal duodenum. CT of the abdomen with no source of bleeding. She underwent capsule endoscopy. This did not show any bleeding source.  She did require IV lasix for volume overload. Creatinine improved to 1.45. Hgb 11.3 on last evaluation. Given bleeding risk anticoagulation was discontinued.   She was recently discharged from the hospital on 07/29/2019.  She was seen and evaluated for acute respiratory failure with hypoxia, which was felt to  be multifactorial given his obesity, COPD, and acute on chronic systolic heart failure.  She was also found to be in atrial fibrillation which was suspected to be the etiology of her worsening LV function and exacerbation of heart failure.  She underwent TEE guided cardioversion on 07/28/2019 with successful conversion to normal sinus rhythm.  She was anticoagulated on apixaban 2.5 mg  twice daily for  4 weeks.  She has a history of profound anemia on anticoagulation in the past and so anticoagulation was discontinued after that.   Echocardiogram on 07/27/2019 with an EF of 25 to 30%, LVH with diffuse hypokinesis with septal dyskinesis, severely dilated left atrium, mild mitral stenosis, and no significant aortic regurgitation. Most recent Echo in January 2021 showed improvement in EF to 40-45%.   On follow up today she states her breathing is poor. Increased use of nebulizers.  Particularly bad when it rains. Weight is up and down but she has no edema.  She reports rhythm has been normal. She is still on home oxygen.  No chest pain or dizziness.      Past Medical History:  Diagnosis Date  . Allergy   . Anemia, unspecified   . Arthritis   . Asthma   . Atrial fibrillation status post cardioversion [facility]) 09/2015   s/p TEE/DCCV>>SR on amio  . Benign neoplasm of colon   . Carpal tunnel syndrome   . Cellulitis and abscess of finger, unspecified   . Cerumen impaction   . Cervicalgia   . CHF (congestive heart failure) ([place])   . Chronic airway obstruction, not elsewhere classified   . Chronic anticoagulation 09/2015   Eliquis  . Diarrhea   . Diffuse cystic mastopathy   . Edema   . Encounter for long-term (current) use of other medications   . GERD (gastroesophageal reflux disease)   . Heart murmur   . Lumbago   . Neck pain 05/23/2015  . Obesity, unspecified   . Other and unspecified hyperlipidemia   . Other psoriasis   . Pain in joint, lower leg   . PONV (postoperative nausea and vomiting)   . Primary localized osteoarthrosis of right shoulder 12/18/2015  . 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 Scoliosis (and kyphoscoliosis), idiopathic   . Shortness of breath dyspnea    with exertion  . Type II or unspecified type diabetes mellitus  without mention of complication, uncontrolled   . Unspecified essential hypertension   . Unspecified hemorrhoids without mention of complication   . Unspecified hypothyroidism   . Urge incontinence     Past Surgical History:  Procedure Laterality Date  . ABDOMINAL HYSTERECTOMY  1987   complete  . BREAST BIOPSY Right   . BREAST EXCISIONAL BIOPSY Left 2011  . BREAST EXCISIONAL BIOPSY Right    x2  . BREAST SURGERY     right breast 3 surgeries [phone]  . CARDIAC CATHETERIZATION N/A 09/02/2016   Procedure: Right/Left Heart Cath and Coronary Angiography;  Surgeon: Arianna Haydon M Martinique, MD;  Location: [place] CV LAB;  Service: Cardiovascular;  Laterality: N/A;  . CARDIOVERSION N/A 10/19/2015   Procedure: CARDIOVERSION;  Surgeon: Lelon Perla, MD;  Location: [facility] ENDOSCOPY;  Service: Cardiovascular;  Laterality: N/A;  . CARDIOVERSION N/A 11/13/2017   Procedure: CARDIOVERSION;  Surgeon: Jerline Pain, MD;  Location: [facility] ENDOSCOPY;  Service: Cardiovascular;  Laterality: N/A;  . CARDIOVERSION N/A 07/28/2019  Procedure: CARDIOVERSION;  Surgeon: Buford Dresser, MD;  Location: [facility] ENDOSCOPY;  Service: Cardiovascular;  Laterality: N/A;  . [place]  . COLON SURGERY     2004,2007,2013.  3 times colon surgieres  . ESOPHAGOGASTRODUODENOSCOPY (EGD) WITH PROPOFOL Left 03/13/2017   Procedure: ESOPHAGOGASTRODUODENOSCOPY (EGD) WITH PROPOFOL;  Surgeon: Ronnette Juniper, MD;  Location: [place];  Service: Gastroenterology;  Laterality: Left;  . ESOPHAGOGASTRODUODENOSCOPY (EGD) WITH PROPOFOL Left 01/22/2018   Procedure: ESOPHAGOGASTRODUODENOSCOPY (EGD) WITH PROPOFOL;  Surgeon: Arta Silence, MD;  Location: [facility] ENDOSCOPY;  Service: Endoscopy;  Laterality: Left;  . EXCISE LE MANDIBULAR LYMPH NODE T     DR C. NEWMAN  . FOOT SURGERY  1989  . GIVENS CAPSULE STUDY Left 01/23/2018   Procedure: GIVENS CAPSULE STUDY;  Surgeon: Wilford Corner, MD;  Location: [facility] ENDOSCOPY;   Service: Endoscopy;  Laterality: Left;  . LEFT SHOULDER ARTHROSCOPY    . 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 NEUROPLASTY / TRANSPOSITION MEDIAN NERVE AT CARPAL TUNNEL BILATERAL  05/2003  . TEE WITHOUT CARDIOVERSION N/A 10/19/2015   Procedure: Transesophageal Echocardiogram (TEE) ;  Surgeon: Lelon Perla, MD;  Location[facility] END[facility];  Service: Cardiovascular;  Laterality: N/A;  . TEE WITHOUT CARDIOVERSION N/A 10/14/2016   Procedure: TRANSESOPHAGEAL ECHOCARDIOGRAM (TEE);  Surgeon: Sherren Mocha, MD;  Location: [place];  Service: Open Heart Surgery;  Laterality: N/A;  . TEE WITHOUT CARDIOVERSION N/A 07/28/2019   Procedure: TRANSESOPHAGEAL ECHOCARDIOGRAM (TEE);  Surgeon: Buford Dresser, MD;  Location: [facility] ENDOSCOPY;  Service: Cardiovascular;  Laterality: N/A;  . TOTAL SHOULDER ARTHROPLASTY Right 12/18/2015   Procedure: TOTAL SHOULDER ARTHROPLASTY;  Surgeon: [name], MD;  Location: [place];  Service: Orthopedics;  Laterality: Right;  . TRANSCATHETER AORTIC VALVE REPLACEMENT, TRANSFEMORAL N/A 10/14/2016   Procedure: TRANSCATHETER AORTIC VALVE REPLACEMENT, TRANSFEMORAL;  Surgeon: Sherren Mocha, MD;  Location: [place];  Service: Open Heart Surgery;  Laterality: N/A;  . TRIGGER FINGER RELEASE Left   . VAGINAL CYST REMOVED  1967     Current Outpatient Medications  Medication Sig Dispense Refill  . acetaminophen (TYLENOL) 650 MG CR tablet Take 1,300 mg by mouth every 8 (eight) hours as needed for pain.    Marland Kitchen albuterol (PROVENTIL) (2.5 MG/3ML) 0.083% nebulizer solution USE 1 VIAL IN NEBULIZER EVERY 4 HOURS AND AS NEEDED. Generic: VENTOLIN 60 vial 10  . albuterol (VENTOLIN HFA) 108 (90 Base) MCG/ACT inhaler Inhale 2 puffs into the lungs every 6 (six) hours as needed for wheezing or shortness of breath. 6.7 g 3  . amiodarone (PACERONE) 200 MG tablet Take 1 tablet (200 mg total) by mouth daily. 30 tablet 2  . Blood Glucose Monitoring Suppl (ONETOUCH VERIO) w/Device KIT Check blood sugar twice daily  DX:E11.9 1 kit 0  . BREO ELLIPTA 200-25 MCG/INH AEPB INHALE 1 PUFF INTO THE LUNGS DAILY 60 each 5  . cetirizine (ZYRTEC) 10 MG tablet Take 10 mg by mouth daily as needed for allergies.     . cholecalciferol (VITAMIN D) 1000 units tablet Take 1,000 Units by mouth at bedtime.     Marland Kitchen doxazosin (CARDURA) 4 MG tablet TAKE 1 TABLET BY MOUTH EVERY DAY TO HELP CONTROL BLOOD PRESSURE 90 tablet 1  . ferrous sulfate 325 (65 FE) MG EC tablet Take 325 mg by mouth 2 (two) times daily.    . furosemide (LASIX) 40 MG tablet Take 2 tablets (80 mg total) by mouth every morning. Take an extra 40 mg tablet every Monday, Wednesday, and Friday at lunch time with  one extra potassium tablet. 90 tablet 3  . glimepiride (AMARYL) 1 MG tablet Take 1 tablet (1 mg total) by mouth daily with breakfast. 30 tablet 1  . glucose blood test strip 1 each by Other route 2 (two) times daily. OneTouch Verio DX E11.8 200 each 11  . JANUVIA 50 MG tablet TAKE 1 TABLET(50 MG) BY MOUTH DAILY 30 tablet 3  . Lancets (ONETOUCH ULTRASOFT) lancets 1 each by Other route 2 (two) times daily. OneTouch Verio DX E11.9 200 each 11  . levothyroxine (SYNTHROID) 88 MCG tablet TAKE 1 TABLET BY MOUTH DAILY ON AN EMPTY STOMACH 90 tablet 1  . pantoprazole (PROTONIX) 40 MG tablet TAKE 1 TABLET(40 MG) BY MOUTH TWICE DAILY 180 tablet 1  . potassium gluconate 595 (99 K) MG TABS tablet Take 595 mg by mouth 2 (two) times daily. Take one extra tablet on Monday, Wednesday, and Friday with extra 40 mg Lasix dose.    . rosuvastatin (CRESTOR) 10 MG tablet TAKE 1 TABLET(10 MG) BY MOUTH DAILY 90 tablet 0   No current facility-administered medications for this visit.    Allergies:   Codeine and Vesicare [solifenacin]    Social History:  The patient  reports that she quit smoking about 30 years ago. She has a 40.00 pack-year smoking history. She has never used smokeless tobacco. She reports that she does not drink alcohol or use drugs.   Family History:  The patient's  family history includes Arthritis in her sister; Asthma in her sister; Diabetes in her father; Heart disease in her brother and father; Liver cancer in her sister; Stroke in her father.    ROS:  Please see the history of present illness.   Otherwise, review of systems are positive for none.   All other systems are reviewed and negative.    PHYSICAL EXAM: VS:  BP (!) 162/71   Pulse 71   Temp (!) 97.1 F (36.2 C)   Ht _0  (1.6 m)   Wt 215 lb (97.5 kg)   SpO2 98%   BMI 38.09 kg/m  , BMI Body mass index is 38.09 kg/m. GENERAL:  Chronically ill appearing obese WF in NAD. On oxygen. HEENT:  PERRL, EOMI, sclera are clear. Oropharynx is clear. NECK:  No jugular venous distention, carotid upstroke brisk and symmetric, no bruits, no thyromegaly or adenopathy LUNGS:  Clear to auscultation bilaterally but poor air movement.  CHEST:  Unremarkable HEART:  RRR,  PMI not displaced or sustained,S1 and S2 within normal limits, no S3, no S4: no clicks, no rubs, gr 29/5 systolic murmur RUSB.  ABD:  Soft, nontender. BS +, no masses or bruits. No hepatomegaly, no splenomegaly EXT:  2 + pulses throughout, tr-1+ pedal edema, no cyanosis no clubbing SKIN:  Warm and dry.  No rashes NEURO:  Alert and oriented x 3. Cranial nerves II through XII intact. PSYCH:  Cognitively intact   EKG:  EKG is not ordered today.    Recent Labs: Lab Results  Component Value Date   WBC 5.6 11/07/2019   HGB 9.7 (L) 11/07/2019   HCT 30.6 (L) 11/07/2019   PLT 169 11/07/2019   GLUCOSE 253 (H) 11/07/2019   CHOL 156 11/07/2019   TRIG 138 11/07/2019   HDL 73 11/07/2019   LDLCALC 61 11/07/2019   ALT 15 11/07/2019   AST 16 11/07/2019   NA 138 11/07/2019   K 4.3 11/07/2019   CL 94 (L) 11/07/2019   CREATININE 1.70 (H) 11/07/2019   BUN 42 (  H) 11/07/2019   CO2 34 (H) 11/07/2019   TSH 7.59 (H) 11/07/2019   INR 1.44 01/20/2018   HGBA1C 9.1 (H) 11/07/2019   MICROALBUR 6.3 06/11/2017      Lipid Panel    Component  Value Date/Time   CHOL 156 11/07/2019 0926   CHOL 182 04/28/2016 0940   TRIG 138 11/07/2019 0926   HDL 73 11/07/2019 0926   HDL 61 04/28/2016 0940   CHOLHDL 2.1 11/07/2019 0926   VLDL 19 06/11/2017 0832   LDLCALC 61 11/07/2019 0926      Wt Readings from Last 3 Encounters:  12/05/19 215 lb (97.5 kg)  11/09/19 207 lb 9.6 oz (94.2 kg)  10/12/19 213 lb (96.6 kg)      Other studies Reviewed: Additional studies/ records that were reviewed today include:   Echo 10/14/17: Study Conclusions  - Left ventricle: The cavity size was normal. Wall thickness was   increased in a pattern of moderate LVH. Systolic function was   normal. The estimated ejection fraction was in the range of 60%   to 65%. Wall motion was normal; there were no regional wall   motion abnormalities. Diastolic dysfunction with elevated LV   filling pressure. - Aortic valve: s/p TAVR. No obstruction or paravalvular leak. Mean   gradient (S): 24 mm Hg. Peak gradient (S): 47 mm Hg. Valve area   (VTI): 1.19 cm^2. Valve area (Vmax): 1.09 cm^2. Valve area   (Vmean): 1.07 cm^2. - Mitral valve: Calcified annulus. Mildly thickened leaflets . Mild   stenosis. There was moderate regurgitation. - Left atrium: Severely dilated. - Right atrium: Mildly dilated. - Tricuspid valve: There was mild regurgitation. - Pulmonary arteries: PA peak pressure: 51 mm Hg (S). - Inferior vena cava: The vessel was normal in size. The   respirophasic diameter changes were in the normal range (>= 50%),   consistent with normal central venous pressure.  Impressions:  - Compared to a prior study in 10/2016, the TAVR valve appears   stable.  TEE/DCCV 07/28/19: FINDINGS:  LEFT VENTRICLE: EFreduced, visual estimate 35%. Global hypokinesis.  RIGHT VENTRICLE: Normal size and function.   LEFT ATRIUM: No thrombus/mass.  LEFT ATRIAL APPENDAGE: No thrombus/mass.   RIGHT ATRIUM: No thrombus/mass.  AORTIC VALVE:S/P TAVR. Valve  leaflets well visualized, very trivial centralregurgitation. No vegetation.  MITRAL VALVE:Moderately calcified annulus and calcified leaflets.Mildregurgitation. No vegetation.  TRICUSPID VALVE: Normal structure.Moderateregurgitation. No vegetation.  PULMONIC VALVE: Grossly normal structure.Trivialregurgitation. No apparent vegetation.  INTERATRIAL SEPTUM:SmallPFO seen by color Doppler.  PERICARDIUM:Trivialeffusion noted.  DESCENDING AORTA: Moderate diffuse plaque seen  Echo 11/07/19: IMPRESSIONS    1. Left ventricular ejection fraction, by visual estimation, is 40 to  45%. The left ventricle has mildly decreased function. There is moderately  increased left ventricular hypertrophy.  2. Abnormal septal motion consistent with left bundle branch block.  3. Left ventricular diastolic function could not be evaluated.  4. The left ventricle demonstrates global hypokinesis.  5. Global right ventricle has normal systolic function.The right  ventricular size is normal. No increase in right ventricular wall  thickness.  6. 23 mm Edwards S3 TAVR. V max 3.5 m/s, MG 26 mmHG, DI 0.36, EOA 1.01  cm2. Gradients are increased but stable compared with 10/14/2017. No  paravalvular leak.  7. Left atrial size was severely dilated.  8. Moderate to severe mitral annular calcification.  9. The mitral valve is degenerative. Moderate mitral valve regurgitation.  Mild mitral stenosis.  10. The tricuspid valve is grossly normal.  11. Aortic  valve regurgitation is not visualized.  12. The pulmonic valve was grossly normal. Pulmonic valve regurgitation is  trivial.  13. Moderately elevated pulmonary artery systolic pressure.  14. The inferior vena cava is dilated in size with <50% respiratory  variability, suggesting right atrial pressure of 15 mmHg.  15. The tricuspid regurgitant velocity is 3.59 m/s, and with an assumed  right atrial pressure of 15 mmHg, the estimated right  ventricular systolic  pressure is moderately elevated at 66.6 mmHg.   In comparison to the previous echocardiogram(s): EF has improved to 40-45%  compared with 25-30% noted on 07/27/2019. TAVR gradients remain stable but  elevated when compared to 10/14/2019. TAVR gradients were lower on  07/27/2019 due to severely reduced EF.  Mild calcific mitral stenosis remains.   CARDIOVERSION:  Indications:Symptomatic Atrial Fibrillation  Procedure Details:  Once the TEE was complete, the patient had the defibrillator pads placed in the anterior and posterior position. Once an appropriate level of sedation was confirmed, the patient was cardioverted x1with 150J of biphasic synchronized energy. The patient converted to sinus bradycardia. There were no apparent complications. The patient had normal neuro status and respiratory status post procedure with vitals stable as recorded elsewhere. Adequate airway was maintained throughout and vital signs monitored per protocol.   ASSESSMENT AND PLAN:  1.  Atrial fibrillation. S/p DCCV in December 2016 and again January 2019. More recent admission in early October 2020 with respiratory failure and recurrent Afib. S/p TEE guided DCCV.  Mali Vasc score of 6. Now on higher dose of amiodarone 200 mg daily. She is maintaining NSR on exam.   Based on the fact she has 3 GI bleeding episodes last year I do not think she is a candidate for long term anticoagulation.  She is off Eliquis now.   2.  Severe Aortic stenosis. Echo in July 2017 showed progression with velocity > 4 cm/sec and mean gradient of 40 mm Hg. Now s/p TAVR in December 2017. Follow up Echo 12/18 with higher than expected gradient (mean gradient of 29 mm Hg) due to patient/ prosthetic mismatch with small annuls size and vigorous LV function. More recent Echo showed stable valve prosthesis.   3. Anemia Fe deficient with GI blood loss secondary to AVMS and erosive gastropathy. GI work up as  noted. Risk of recurrent bleeding is high. no anticoagulation or antiplatelet therapy. Recent Hgb 9.7  4. Chronic kidney disease stage 3. stable  5. S/p right shoulder arthroplasty.   6. Acute on Chronic systolic/diastolic CHF. Weight is stable without significant edema. Would continue current therapy. Fortunately EF improved to 40-45% post TAVR and restoration of NSR.  7. HTN  8. DM type 2.  Managed by primary care.  9. COPD. Recommend follow up with pulmonary  I will follow up in 4 months.   Signed, Jariel Drost Martinique, MD  12/05/2019 11:36 AM    [facility] [address], [place], [place], [address] Phone [phone], Fax [phone]

## 2019-12-04 ENCOUNTER — Other Ambulatory Visit: Payer: Self-pay | Admitting: Nurse Practitioner

## 2019-12-04 DIAGNOSIS — R195 Other fecal abnormalities: Secondary | ICD-10-CM

## 2019-12-05 ENCOUNTER — Encounter: Payer: Self-pay | Admitting: Cardiology

## 2019-12-05 ENCOUNTER — Other Ambulatory Visit: Payer: Self-pay

## 2019-12-05 ENCOUNTER — Ambulatory Visit: Payer: Medicare Other | Admitting: Cardiology

## 2019-12-05 VITALS — BP 162/71 | HR 71 | Temp 97.1°F | Ht 63.0 in | Wt 215.0 lb

## 2019-12-05 DIAGNOSIS — I5042 Chronic combined systolic (congestive) and diastolic (congestive) heart failure: Secondary | ICD-10-CM | POA: Diagnosis not present

## 2019-12-05 DIAGNOSIS — I480 Paroxysmal atrial fibrillation: Secondary | ICD-10-CM

## 2019-12-05 DIAGNOSIS — J449 Chronic obstructive pulmonary disease, unspecified: Secondary | ICD-10-CM | POA: Diagnosis not present

## 2019-12-05 DIAGNOSIS — Z952 Presence of prosthetic heart valve: Secondary | ICD-10-CM

## 2019-12-05 NOTE — Patient Instructions (Signed)
Arrange follow up with Pulmonary

## 2019-12-26 ENCOUNTER — Other Ambulatory Visit: Payer: Self-pay | Admitting: Nurse Practitioner

## 2019-12-26 DIAGNOSIS — E785 Hyperlipidemia, unspecified: Secondary | ICD-10-CM

## 2019-12-27 ENCOUNTER — Other Ambulatory Visit: Payer: Self-pay | Admitting: Nurse Practitioner

## 2019-12-27 DIAGNOSIS — Z1231 Encounter for screening mammogram for malignant neoplasm of breast: Secondary | ICD-10-CM

## 2019-12-29 ENCOUNTER — Ambulatory Visit: Payer: Medicare Other

## 2019-12-29 DIAGNOSIS — J449 Chronic obstructive pulmonary disease, unspecified: Secondary | ICD-10-CM | POA: Diagnosis not present

## 2020-01-01 ENCOUNTER — Ambulatory Visit: Payer: Medicare Other | Attending: Internal Medicine

## 2020-01-01 DIAGNOSIS — Z23 Encounter for immunization: Secondary | ICD-10-CM | POA: Insufficient documentation

## 2020-01-01 NOTE — Progress Notes (Signed)
   Covid-19 Vaccination Clinic  Name:  Patricia [name]    MRN: [id number] DOB: Oct 30, 1935  01/01/2020  Patricia [name] was observed post Covid-19 immunization for 15 minutes without incident. She was provided with Vaccine Information Sheet and instruction to access the V-Safe system.   Patricia [name] was instructed to call 911 with any severe reactions post vaccine: Marland Kitchen Difficulty breathing  . Swelling of face and throat  . A fast heartbeat  . A bad rash all over body  . Dizziness and weakness   Immunizations Administered    Name Date Dose VIS Date Route   Pfizer COVID-19 Vaccine 01/01/2020 11:56 AM 0.3 mL 10/07/2019 Intramuscular   Manufacturer: [place]   Lot: [id number]   [place]: [id number]

## 2020-01-08 ENCOUNTER — Other Ambulatory Visit: Payer: Self-pay | Admitting: Primary Care

## 2020-01-16 ENCOUNTER — Other Ambulatory Visit: Payer: Self-pay

## 2020-01-16 MED ORDER — LEVOTHYROXINE SODIUM 88 MCG PO TABS: ORAL_TABLET | ORAL | 1 refills | Status: DC

## 2020-01-26 ENCOUNTER — Other Ambulatory Visit: Payer: Self-pay

## 2020-01-26 ENCOUNTER — Ambulatory Visit: Payer: Medicare Other

## 2020-01-26 MED ORDER — AMIODARONE HCL 200 MG PO TABS: 200.0000 mg | ORAL_TABLET | Freq: Every day | ORAL | 2 refills | Status: DC

## 2020-01-29 DIAGNOSIS — J449 Chronic obstructive pulmonary disease, unspecified: Secondary | ICD-10-CM | POA: Diagnosis not present

## 2020-01-31 ENCOUNTER — Ambulatory Visit: Payer: Medicare Other | Attending: Internal Medicine

## 2020-01-31 DIAGNOSIS — Z23 Encounter for immunization: Secondary | ICD-10-CM

## 2020-01-31 NOTE — Progress Notes (Signed)
   Covid-19 Vaccination Clinic  Name:  Patricia [name]    MRN: [id number] DOB: 21-Dec-1935  01/31/2020  Patricia [name] was observed post Covid-19 immunization for 15 minutes without incident. She was provided with Vaccine Information Sheet and instruction to access the V-Safe system.   Patricia [name] was instructed to call 911 with any severe reactions post vaccine: Marland Kitchen Difficulty breathing  . Swelling of face and throat  . A fast heartbeat  . A bad rash all over body  . Dizziness and weakness   Immunizations Administered    Name Date Dose VIS Date Route   Pfizer COVID-19 Vaccine 01/31/2020 11:58 AM 0.3 mL 10/07/2019 Intramuscular   Manufacturer: [place]   Lot: [id number]   [place]: [id number]

## 2020-02-02 ENCOUNTER — Other Ambulatory Visit: Payer: Self-pay | Admitting: Nurse Practitioner

## 2020-02-02 DIAGNOSIS — E1165 Type 2 diabetes mellitus with hyperglycemia: Secondary | ICD-10-CM

## 2020-02-03 ENCOUNTER — Other Ambulatory Visit: Payer: Medicare Other

## 2020-02-03 ENCOUNTER — Other Ambulatory Visit: Payer: Self-pay

## 2020-02-03 DIAGNOSIS — D649 Anemia, unspecified: Secondary | ICD-10-CM

## 2020-02-03 DIAGNOSIS — E1165 Type 2 diabetes mellitus with hyperglycemia: Secondary | ICD-10-CM | POA: Diagnosis not present

## 2020-02-03 DIAGNOSIS — E039 Hypothyroidism, unspecified: Secondary | ICD-10-CM | POA: Diagnosis not present

## 2020-02-03 DIAGNOSIS — N184 Chronic kidney disease, stage 4 (severe): Secondary | ICD-10-CM | POA: Diagnosis not present

## 2020-02-03 MED ORDER — FUROSEMIDE 40 MG PO TABS: 80.0000 mg | ORAL_TABLET | ORAL | 3 refills | Status: DC

## 2020-02-04 LAB — BASIC METABOLIC PANEL WITH GFR
BUN/Creatinine Ratio: 28 (calc) — ABNORMAL HIGH (ref 6–22)
BUN: 52 mg/dL — ABNORMAL HIGH (ref 7–25)
CO2: 31 mmol/L (ref 20–32)
Calcium: 9 mg/dL (ref 8.6–10.4)
Chloride: 95 mmol/L — ABNORMAL LOW (ref 98–110)
Creat: 1.87 mg/dL — ABNORMAL HIGH (ref 0.60–0.88)
GFR, Est African American: 29 mL/min/{1.73_m2} — ABNORMAL LOW (ref >=60)
GFR, Est Non African American: 25 mL/min/{1.73_m2} — ABNORMAL LOW (ref >=60)
Glucose, Bld: 168 mg/dL — ABNORMAL HIGH (ref 65–99)
Potassium: 4.3 mmol/L (ref 3.5–5.3)
Sodium: 139 mmol/L (ref 135–146)

## 2020-02-04 LAB — CBC WITH DIFFERENTIAL/PLATELET
Absolute Monocytes: 589 cells/uL (ref 200–950)
Basophils Absolute: 38 cells/uL (ref 0–200)
Basophils Relative: 0.6 %
Eosinophils Absolute: 122 cells/uL (ref 15–500)
Eosinophils Relative: 1.9 %
HCT: 33 % — ABNORMAL LOW (ref 35.0–45.0)
Hemoglobin: 10.3 g/dL — ABNORMAL LOW (ref 11.7–15.5)
Lymphs Abs: 1139 cells/uL (ref 850–3900)
MCH: 27.2 pg (ref 27.0–33.0)
MCHC: 31.2 g/dL — ABNORMAL LOW (ref 32.0–36.0)
MCV: 87.3 fL (ref 80.0–100.0)
MPV: 12 fL (ref 7.5–12.5)
Monocytes Relative: 9.2 %
Neutro Abs: 4512 cells/uL (ref 1500–7800)
Neutrophils Relative %: 70.5 %
Platelets: 162 10*3/uL (ref 140–400)
RBC: 3.78 10*6/uL — ABNORMAL LOW (ref 3.80–5.10)
RDW: 14.8 % (ref 11.0–15.0)
Total Lymphocyte: 17.8 %
WBC: 6.4 10*3/uL (ref 3.8–10.8)

## 2020-02-04 LAB — HEMOGLOBIN A1C
Hgb A1c MFr Bld: 7.2 % of total Hgb — ABNORMAL HIGH (ref <5.7)
Mean Plasma Glucose: 160 (calc)
eAG (mmol/L): 8.9 (calc)

## 2020-02-04 LAB — TSH: TSH: 3.91 mIU/L (ref 0.40–4.50)

## 2020-02-08 ENCOUNTER — Ambulatory Visit (INDEPENDENT_AMBULATORY_CARE_PROVIDER_SITE_OTHER): Payer: Medicare Other | Admitting: Nurse Practitioner

## 2020-02-08 ENCOUNTER — Other Ambulatory Visit: Payer: Self-pay

## 2020-02-08 ENCOUNTER — Encounter: Payer: Self-pay | Admitting: Nurse Practitioner

## 2020-02-08 VITALS — BP 128/34 | HR 64 | Temp 96.6°F | Ht 63.0 in | Wt 210.0 lb

## 2020-02-08 DIAGNOSIS — D649 Anemia, unspecified: Secondary | ICD-10-CM

## 2020-02-08 DIAGNOSIS — I480 Paroxysmal atrial fibrillation: Secondary | ICD-10-CM | POA: Diagnosis not present

## 2020-02-08 DIAGNOSIS — I10 Essential (primary) hypertension: Secondary | ICD-10-CM

## 2020-02-08 DIAGNOSIS — E785 Hyperlipidemia, unspecified: Secondary | ICD-10-CM

## 2020-02-08 DIAGNOSIS — J9611 Chronic respiratory failure with hypoxia: Secondary | ICD-10-CM | POA: Diagnosis not present

## 2020-02-08 DIAGNOSIS — J449 Chronic obstructive pulmonary disease, unspecified: Secondary | ICD-10-CM

## 2020-02-08 DIAGNOSIS — E039 Hypothyroidism, unspecified: Secondary | ICD-10-CM

## 2020-02-08 DIAGNOSIS — E66812 Obesity, class 2: Secondary | ICD-10-CM

## 2020-02-08 DIAGNOSIS — Z6837 Body mass index (BMI) 37.0-37.9, adult: Secondary | ICD-10-CM

## 2020-02-08 DIAGNOSIS — E1165 Type 2 diabetes mellitus with hyperglycemia: Secondary | ICD-10-CM

## 2020-02-08 DIAGNOSIS — I350 Nonrheumatic aortic (valve) stenosis: Secondary | ICD-10-CM

## 2020-02-08 NOTE — Progress Notes (Signed)
Careteam: Patient Care Team: [name], NP as PCP - General (Geriatric Medicine) Martinique, [name] M, MD as PCP - Cardiology (Cardiology) [name], MD as Consulting Physician (Orthopedic Surgery) Druscilla Brownie, MD as Consulting Physician (Dermatology) Ralene Bathe, MD as Consulting Physician (Ophthalmology)  PLACE OF SERVICE:  [place] Directive information Does Patient Have a Medical Advance Directive?: Yes, Type of Advance Directive: [place], Does patient want to make changes to medical advance directive?: No - Patient declined  Allergies  Allergen Reactions  . Codeine Other (See Comments)    "Spaces out" the patient and she cannot walk well  . Vesicare [Solifenacin] Itching    Chief Complaint  Patient presents with  . Medical Management of Chronic Issues    3 month follow-up   . Quality Metric Gaps    Discuss need for TD and Eye exam      HPI: Patient is a 84 y.o. female for routine follow up.   Reports she is doing well, breathing is unchanged, "dont do well with this mask on"  Will have occasional diarrhea, may happen once weekly, may happen every 3 weeks, appt with GI for this. Doing well at this time.   A fib- not a candidate for eliquis due to recurrent gi bleed  DM-A1c 7.2 down from 9.1, blood sugar 100-147. No hypoglycemia. Last eye exam was in October by Sandre Kitty   Hypothyroid- TSH has improved to 3.9, making it a point to take it first thing.   Anemia- stable on last labs. Has appt for lab work for iron studies for GI this week.   COPD- stable, continues on O2   OA- worse with rainy weather, tylenol helps.   Review of Systems:  Review of Systems  Constitutional: Negative for chills, fever and weight loss.  HENT: Negative for tinnitus.   Respiratory: Positive for shortness of breath (baseline). Negative for cough and sputum production.   Cardiovascular: Negative for chest pain, palpitations  and leg swelling.  Gastrointestinal: Positive for diarrhea (on and off). Negative for abdominal pain, constipation and heartburn.  Genitourinary: Negative for dysuria, frequency and urgency.  Musculoskeletal: Positive for joint pain and myalgias. Negative for back pain and falls.       Occasional myalgias and joint pain  Skin: Negative.   Neurological: Negative for dizziness and headaches.  Psychiatric/Behavioral: Negative for depression and memory loss. The patient does not have insomnia.     Past Medical History:  Diagnosis Date  . Allergy   . Anemia, unspecified   . Arthritis   . Asthma   . Atrial fibrillation status post cardioversion [facility]) 09/2015   s/p TEE/DCCV>>SR on amio  . Benign neoplasm of colon   . Carpal tunnel syndrome   . Cellulitis and abscess of finger, unspecified   . Cerumen impaction   . Cervicalgia   . CHF (congestive heart failure) ([place])   . Chronic airway obstruction, not elsewhere classified   . Chronic anticoagulation 09/2015   Eliquis  . Diarrhea   . Diffuse cystic mastopathy   . Edema   . Encounter for long-term (current) use of other medications   . GERD (gastroesophageal reflux disease)   . Heart murmur   . Lumbago   . Neck pain 05/23/2015  . Obesity, unspecified   . Other and unspecified hyperlipidemia   . Other psoriasis   . Pain in joint, lower leg   . PONV (postoperative nausea and vomiting)   . Primary  localized osteoarthrosis of right shoulder 12/18/2015  . 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 Scoliosis (and kyphoscoliosis), idiopathic   . Shortness of breath dyspnea    with exertion  . Type II or unspecified type diabetes mellitus without mention of complication, uncontrolled   . Unspecified essential hypertension   . Unspecified hemorrhoids without mention of complication   . Unspecified hypothyroidism   .  Urge incontinence    Past Surgical History:  Procedure Laterality Date  . ABDOMINAL HYSTERECTOMY  1987   complete  . BREAST BIOPSY Right   . BREAST EXCISIONAL BIOPSY Left 2011  . BREAST EXCISIONAL BIOPSY Right    x2  . BREAST SURGERY     right breast 3 surgeries [phone]  . CARDIAC CATHETERIZATION N/A 09/02/2016   Procedure: Right/Left Heart Cath and Coronary Angiography;  Surgeon: [name] M Martinique, MD;  Location: [place] CV LAB;  Service: Cardiovascular;  Laterality: N/A;  . CARDIOVERSION N/A 10/19/2015   Procedure: CARDIOVERSION;  Surgeon: Lelon Perla, MD;  Location: [facility] ENDOSCOPY;  Service: Cardiovascular;  Laterality: N/A;  . CARDIOVERSION N/A 11/13/2017   Procedure: CARDIOVERSION;  Surgeon: Jerline Pain, MD;  Location: [facility] ENDOSCOPY;  Service: Cardiovascular;  Laterality: N/A;  . CARDIOVERSION N/A 07/28/2019   Procedure: CARDIOVERSION;  Surgeon: Buford Dresser, MD;  Location: [place];  Service: Cardiovascular;  Laterality: N/A;  . [place]  . COLON SURGERY     2004,2007,2013.  3 times colon surgieres  . ESOPHAGOGASTRODUODENOSCOPY (EGD) WITH PROPOFOL Left 03/13/2017   Procedure: ESOPHAGOGASTRODUODENOSCOPY (EGD) WITH PROPOFOL;  Surgeon: Ronnette Juniper, MD;  Location: [place];  Service: Gastroenterology;  Laterality: Left;  . ESOPHAGOGASTRODUODENOSCOPY (EGD) WITH PROPOFOL Left 01/22/2018   Procedure: ESOPHAGOGASTRODUODENOSCOPY (EGD) WITH PROPOFOL;  Surgeon: Arta Silence, MD;  Location: [facility] ENDOSCOPY;  Service: Endoscopy;  Laterality: Left;  . EXCISE LE MANDIBULAR LYMPH NODE T     DR C. NEWMAN  . FOOT SURGERY  1989  . GIVENS CAPSULE STUDY Left 01/23/2018   Procedure: GIVENS CAPSULE STUDY;  Surgeon: Wilford Corner, MD;  Location: [facility] ENDOSCOPY;  Service: Endoscopy;  Laterality: Left;  . LEFT SHOULDER ARTHROSCOPY    . 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 NEUROPLASTY / TRANSPOSITION MEDIAN NERVE AT CARPAL TUNNEL BILATERAL  05/2003  .  TEE WITHOUT CARDIOVERSION N/A 10/19/2015   Procedure: Transesophageal Echocardiogram (TEE) ;  Surgeon: Lelon Perla, MD;  Location: [facility] ENDOSCOPY;  Service: Cardiovascular;  Laterality: N/A;  . TEE WITHOUT CARDIOVERSION N/A 10/14/2016   Procedure: TRANSESOPHAGEAL ECHOCARDIOGRAM (TEE);  Surgeon: Sherren Mocha, MD;  Location: [place];  Service: Open Heart Surgery;  Laterality: N/A;  . TEE WITHOUT CARDIOVERSION N/A 07/28/2019   Procedure: TRANSESOPHAGEAL ECHOCARDIOGRAM (TEE);  Surgeon: Buford Dresser, MD;  Location: [facility] ENDOSCOPY;  Service: Cardiovascular;  Laterality: N/A;  . TOTAL SHOULDER ARTHROPLASTY Right 12/18/2015   Procedure: TOTAL SHOULDER ARTHROPLASTY;  Surgeon: [name], MD;  Location: [place];  Service: Orthopedics;  Laterality: Right;  . TRANSCATHETER AORTIC VALVE REPLACEMENT, TRANSFEMORAL N/A 10/14/2016   Procedure: TRANSCATHETER AORTIC VALVE REPLACEMENT, TRANSFEMORAL;  Surgeon: Sherren Mocha, MD;  Location: [place];  Service: Open Heart Surgery;  Laterality: N/A;  . TRIGGER FINGER RELEASE Left   . VAGINAL CYST REMOVED  1967   Social History:   reports that she quit smoking about 30 years ago. She has a 40.00 pack-year smoking history. She has never used  smokeless tobacco. She reports that she does not drink alcohol or use drugs.  Family History  Problem Relation Age of Onset  . Diabetes Father   . Stroke Father   . Heart disease Father   . Liver cancer Sister   . Heart disease Brother   . Asthma Sister   . Arthritis Sister   . Breast cancer Neg Hx     Medications: Patient's Medications  New Prescriptions   No medications on file  Previous Medications   ACETAMINOPHEN (TYLENOL) 650 MG CR TABLET    Take 1,300 mg by mouth every 8 (eight) hours as needed for pain.   ALBUTEROL (PROVENTIL) (2.5 MG/3ML) 0.083% NEBULIZER SOLUTION    USE 1 VIAL IN NEBULIZER EVERY 4 HOURS AND AS NEEDED. Generic: VENTOLIN   ALBUTEROL (VENTOLIN HFA) 108 (90 BASE) MCG/ACT INHALER    Inhale 2  puffs into the lungs every 6 (six) hours as needed for wheezing or shortness of breath.   AMIODARONE (PACERONE) 200 MG TABLET    Take 1 tablet (200 mg total) by mouth daily.   BLOOD GLUCOSE MONITORING SUPPL (ONETOUCH VERIO) W/DEVICE KIT    Check blood sugar twice daily DX:E11.9   BREO ELLIPTA 200-25 MCG/INH AEPB    INHALE 1 PUFF INTO THE LUNGS DAILY   CETIRIZINE (ZYRTEC) 10 MG TABLET    Take 10 mg by mouth daily as needed for allergies.    CHOLECALCIFEROL (VITAMIN D) 1000 UNITS TABLET    Take 1,000 Units by mouth at bedtime.    DEXTROMETHORPHAN-GUAIFENESIN (MUCINEX DM) 30-600 MG TB12    Take 2 tablets by mouth as needed.   DOXAZOSIN (CARDURA) 4 MG TABLET    TAKE 1 TABLET BY MOUTH EVERY DAY TO HELP CONTROL BLOOD PRESSURE   FERROUS SULFATE 325 (65 FE) MG EC TABLET    Take 325 mg by mouth 2 (two) times daily.   FUROSEMIDE (LASIX) 40 MG TABLET    Take 2 tablets (80 mg total) by mouth every morning. Take an extra 40 mg tablet every Monday, Wednesday, and Friday at lunch time with one extra potassium tablet.   GLIMEPIRIDE (AMARYL) 1 MG TABLET    TAKE 1 TABLET(1 MG) BY MOUTH DAILY WITH BREAKFAST   GLUCOSE BLOOD TEST STRIP    1 each by Other route 2 (two) times daily. OneTouch Verio DX E11.8   JANUVIA 50 MG TABLET    TAKE 1 TABLET(50 MG) BY MOUTH DAILY   LANCETS (ONETOUCH ULTRASOFT) LANCETS    1 each by Other route 2 (two) times daily. OneTouch Verio DX E11.9   LEVOTHYROXINE (SYNTHROID) 88 MCG TABLET    TAKE 1 TABLET BY MOUTH DAILY ON AN EMPTY STOMACH   OXYGEN    Inhale 2 L into the lungs continuous.   PANTOPRAZOLE (PROTONIX) 40 MG TABLET    TAKE 1 TABLET(40 MG) BY MOUTH TWICE DAILY   POTASSIUM GLUCONATE 595 (99 K) MG TABS TABLET    Take 595 mg by mouth 2 (two) times daily. Take one extra tablet on Monday, Wednesday, and Friday with extra 40 mg Lasix dose.   ROSUVASTATIN (CRESTOR) 10 MG TABLET    TAKE 1 TABLET(10 MG) BY MOUTH DAILY  Modified Medications   No medications on file  Discontinued Medications    No medications on file    Physical Exam:  Vitals:   02/08/20 1424  BP: (!) 128/34  Pulse: 64  Temp: (!) 96.6 F (35.9 C)  TempSrc: Temporal  SpO2: 95%  Weight: 210 lb (  95.3 kg)  Height: _0  (1.6 m)   Body mass index is 37.2 kg/m. Wt Readings from Last 3 Encounters:  02/08/20 210 lb (95.3 kg)  12/05/19 215 lb (97.5 kg)  11/09/19 207 lb 9.6 oz (94.2 kg)    Physical Exam Constitutional:      General: She is not in acute distress.    Appearance: She is well-developed. She is not diaphoretic.  HENT:     Head: Normocephalic and atraumatic.  Eyes:     Conjunctiva/sclera: Conjunctivae normal.     Pupils: Pupils are equal, round, and reactive to light.  Cardiovascular:     Rate and Rhythm: Normal rate and regular rhythm.     Heart sounds: Normal heart sounds.  Pulmonary:     Effort: Pulmonary effort is normal.     Comments: Diminished throughout  Abdominal:     General: Bowel sounds are normal.     Palpations: Abdomen is soft.  Musculoskeletal:        General: No tenderness.     Cervical back: Normal range of motion and neck supple.     Right lower leg: No edema.     Left lower leg: No edema.  Skin:    General: Skin is warm and dry.  Neurological:     General: No focal deficit present.     Mental Status: She is alert and oriented to person, place, and time.  Psychiatric:        Mood and Affect: Mood normal.        Behavior: Behavior normal.     Labs reviewed: Basic Metabolic Panel: Recent Labs    07/26/19 0431 07/27/19 0409 08/09/19 1044 11/07/19 0926 02/03/20 0906  NA 138   < > 144 138 139  K 4.9   < > 4.0 4.3 4.3  CL 99   < > 96 94* 95*  CO2 26   < > 31* 34* 31  GLUCOSE 259*   < > 156* 253* 168*  BUN 62*   < > 38* 42* 52*  CREATININE 2.45*   < > 1.82* 1.70* 1.87*  CALCIUM 8.9   < > 8.6* 9.2 9.0  MG 2.3  --   --   --   --   TSH 1.749  --   --  7.59* 3.91   < > = values in this interval not displayed.   Liver Function Tests: Recent Labs     11/07/19 0926  AST 16  ALT 15  BILITOT 0.7  PROT 7.2   No results for input(s): LIPASE, AMYLASE in the last 8760 hours. No results for input(s): AMMONIA in the last 8760 hours. CBC: Recent Labs    07/25/19 1805 07/25/19 2238 08/09/19 1044 11/07/19 0926 02/03/20 0906  WBC 6.2   < > 6.6 5.6 6.4  NEUTROABS 5.0  --   --  3,982 4,512  HGB 10.9*   < > 10.3* 9.7* 10.3*  HCT 36.4   < > 32.1* 30.6* 33.0*  MCV 94.8   < > 87 88.2 87.3  PLT 166   < > 194 169 162   < > = values in this interval not displayed.   Lipid Panel: Recent Labs    11/07/19 0926  CHOL 156  HDL 73  LDLCALC 61  TRIG 138  CHOLHDL 2.1   TSH: Recent Labs    07/26/19 0431 11/07/19 0926 02/03/20 0906  TSH 1.749 7.59* 3.91   A1C: Lab Results  Component Value Date  HGBA1C 7.2 (H) 02/03/2020     Assessment/Plan 1. Chronic respiratory failure with hypoxia (HCC) -stable, continues to be monitored by pulmomary and cardiology for COPD and CHF. Without worsening of symptoms at this time.   2. Class 2 severe obesity due to excess calories with serious comorbidity and body mass index (BMI) of 37 in adult [facility]) -stable, encouraged to continue to work on dietary modifications for healthy weight loss.   3. Aortic valve stenosis, etiology of cardiac valve disease unspecified - recent echo with stable disease, continues to be followed by  cardiology  4. Paroxysmal atrial fibrillation (HCC) -rate controlled, she is not a candidates for anticoagulation due to 3 episodes of GI bleed. NSR at this time.  5. Essential hypertension -blood pressure stable on current regimen.  6. Chronic obstructive pulmonary disease, unspecified COPD type ([place]) Reports breathing is stable. No increase in shortness of breath at this time except when she wears her mask. Continues on breo with albuterol PRN  7. Acquired hypothyroidism TSH stable, she has started taking medication routinely first thing in the morning. Will continues  synthroid 88 mcg  8. Type 2 diabetes mellitus with hyperglycemia, without long-term current use of insulin (HCC) A1c has improved since addition of glimepiride 1 mg daily with breakfast. Continues on januvia. No episodes of hypoglycemia. Encouraged dietary compliance, routine foot care/monitoring and to keep up with diabetic eye exams through ophthalmology   9. Anemia, unspecified type Cbc stable on recent labs. Continues on iron supplement.   10. Hyperlipidemia, unspecified hyperlipidemia type -LDL at goal  In January on crestor  11. Diarrhea -ongoing, will last a day then stop, this has been off and on for several months. Has consult with GI to discuss and further evaluation   Next appt: 5 months, labs prior to appt  [place] [place], [facility] [phone]

## 2020-02-09 ENCOUNTER — Other Ambulatory Visit: Payer: Self-pay | Admitting: Nurse Practitioner

## 2020-02-09 DIAGNOSIS — E1165 Type 2 diabetes mellitus with hyperglycemia: Secondary | ICD-10-CM

## 2020-02-10 DIAGNOSIS — D500 Iron deficiency anemia secondary to blood loss (chronic): Secondary | ICD-10-CM | POA: Diagnosis not present

## 2020-02-15 ENCOUNTER — Ambulatory Visit
Admission: RE | Admit: 2020-02-15 | Discharge: 2020-02-15 | Disposition: A | Discharge status: Home / Self Care | Payer: Medicare Other | Source: Ambulatory Visit | Attending: Nurse Practitioner | Admitting: Nurse Practitioner

## 2020-02-15 ENCOUNTER — Other Ambulatory Visit: Payer: Self-pay

## 2020-02-15 DIAGNOSIS — Z1231 Encounter for screening mammogram for malignant neoplasm of breast: Secondary | ICD-10-CM | POA: Diagnosis not present

## 2020-02-17 ENCOUNTER — Telehealth: Payer: Self-pay | Admitting: Primary Care

## 2020-02-17 MED ORDER — PREDNISONE 10 MG PO TABS: ORAL_TABLET | ORAL | 0 refills | Status: DC

## 2020-02-17 NOTE — Telephone Encounter (Signed)
Seen by Volanda Napoleon last couple of visit will send to her to see if she has any recs   Please contact office for sooner follow up if symptoms do not improve or worsen or seek emergency care

## 2020-02-17 NOTE — Telephone Encounter (Signed)
Pt c/o increased sob, chest tightness, runny nose X1 week.   Denies fever, sob, loss of taste/smell, HA, prod cough.   Pt has been taking mucinex, otc allergy meds to help with s/s.  Pt requesting a pred taper.   Pharmacy: [facility] on [address].    MR pt, sending to APP of the day to address.  TP please advise on recs.  Thanks!

## 2020-02-17 NOTE — Telephone Encounter (Signed)
I will send in a prednisone taper but she needs to follow-up in 1-2 weeks. Make sure she is using her Breo once daily and prn albuterol every 4-6 hours for breakthrough shortness of breath/wheezing.

## 2020-02-17 NOTE — Telephone Encounter (Signed)
Spoke with patient. She is aware that the prednisone taper has been sent in. She has been scheduled for a follow up in 2 weeks with Beth on 5/7 at 230pm.   Nothing further needed at time of call.

## 2020-02-21 ENCOUNTER — Other Ambulatory Visit (HOSPITAL_COMMUNITY): Payer: Self-pay | Admitting: *Deleted

## 2020-02-22 ENCOUNTER — Ambulatory Visit (HOSPITAL_COMMUNITY)
Admission: RE | Admit: 2020-02-22 | Discharge: 2020-02-22 | Disposition: A | Discharge status: Home / Self Care | Payer: Medicare Other | Source: Ambulatory Visit | Attending: Gastroenterology | Admitting: Gastroenterology

## 2020-02-22 ENCOUNTER — Other Ambulatory Visit: Payer: Self-pay

## 2020-02-22 DIAGNOSIS — D500 Iron deficiency anemia secondary to blood loss (chronic): Secondary | ICD-10-CM | POA: Diagnosis not present

## 2020-02-22 MED ORDER — SODIUM CHLORIDE 0.9 % IV SOLN
510.0000 mg | INTRAVENOUS | Status: DC
  Administered 2020-02-22: 11:00:00 510 mg via INTRAVENOUS
  Filled 2020-02-22: qty 17

## 2020-02-22 NOTE — Discharge Instructions (Signed)

## 2020-02-28 DIAGNOSIS — J449 Chronic obstructive pulmonary disease, unspecified: Secondary | ICD-10-CM | POA: Diagnosis not present

## 2020-02-29 ENCOUNTER — Encounter (HOSPITAL_COMMUNITY)
Admission: RE | Admit: 2020-02-29 | Discharge: 2020-02-29 | Disposition: A | Discharge status: Home / Self Care | Payer: Medicare Other | Source: Ambulatory Visit | Attending: Gastroenterology | Admitting: Gastroenterology

## 2020-02-29 ENCOUNTER — Other Ambulatory Visit: Payer: Self-pay

## 2020-02-29 DIAGNOSIS — D500 Iron deficiency anemia secondary to blood loss (chronic): Secondary | ICD-10-CM | POA: Insufficient documentation

## 2020-02-29 MED ORDER — SODIUM CHLORIDE 0.9 % IV SOLN
510.0000 mg | INTRAVENOUS | Status: AC
  Administered 2020-02-29: 510 mg via INTRAVENOUS
  Filled 2020-02-29: qty 17

## 2020-03-02 ENCOUNTER — Ambulatory Visit: Payer: Medicare Other | Admitting: Primary Care

## 2020-03-02 ENCOUNTER — Encounter: Payer: Self-pay | Admitting: Primary Care

## 2020-03-02 ENCOUNTER — Other Ambulatory Visit: Payer: Self-pay

## 2020-03-02 VITALS — BP 168/74 | HR 63 | Temp 97.3°F | Ht 63.0 in | Wt 207.0 lb

## 2020-03-02 DIAGNOSIS — J300 Vasomotor rhinitis: Secondary | ICD-10-CM | POA: Diagnosis not present

## 2020-03-02 DIAGNOSIS — J310 Chronic rhinitis: Secondary | ICD-10-CM | POA: Insufficient documentation

## 2020-03-02 DIAGNOSIS — J449 Chronic obstructive pulmonary disease, unspecified: Secondary | ICD-10-CM

## 2020-03-02 DIAGNOSIS — J9611 Chronic respiratory failure with hypoxia: Secondary | ICD-10-CM | POA: Insufficient documentation

## 2020-03-02 MED ORDER — AZELASTINE HCL 0.1 % NA SOLN: 1.0000 | Freq: Two times a day (BID) | NASAL | 1 refills | Status: AC

## 2020-03-02 MED ORDER — TRELEGY ELLIPTA 200-62.5-25 MCG/INH IN AEPB: 1.0000 | INHALATION_SPRAY | Freq: Every day | RESPIRATORY_TRACT | 0 refills | Status: DC

## 2020-03-02 NOTE — Assessment & Plan Note (Addendum)
-  O2 after 2 laps sustaining 98-99% on 2L - Advised patient decreased oxygen to 1L continuous on exertion as needed  - Referral to add humidification

## 2020-03-02 NOTE — Assessment & Plan Note (Signed)
-  Mixed COPD/asthma. Increased shortness of breath with associated chest tightness and wheezing over the last several months. Treated for acute exacerbation, improved with steriods but symptoms returned.  - Change BREO to Trelegy 200 (sample given) - Follow-up in 1 month

## 2020-03-02 NOTE — Assessment & Plan Note (Signed)
-  RX astelin 1 spray per nostril twice daily for post nasal drip

## 2020-03-02 NOTE — Progress Notes (Signed)
_0  ID: Patricia [name], female    DOB: [birth year], 84 y.o.   MRN: [id number]  Chief Complaint  Patient presents with  . Follow-up    Predisone helped, SOB has resolved somewhat     Referring provider: [name], NP  HPI:  84 year old female, former smoker quit in [place] (40 pack year hx). PMH significant for COPD, obesity hypoventilation syndrome, CHF, Afib, HTN, aortic stenosis, s/p TAVR in 2017, GIB. Patient of Dr. Chase Caller. Maintained on Breo 200.  Previous [facility] pulmonary encounters: 07/07/2019 Patient contacted today for acute televisit. Report increased shortness of breath, wheezing x 5 days. No associated cough or congestion. Using Breo 200 as prescribed, using albuterol nebulizer 4x day with not much improvement. She does not weigh herself daily but reports it styas within 5 lbs. She does have feet swelling. She takes lasix 34m daily as prescribed. Reports that when she gets short of breath her heart rate goes up. Denies sick contact or covid exposure. No chest pain, cough.   03/02/2020 Patient presents today for follow-up visit for shortness of breath. She called our office on 4/23 with reports of increased shortness of breath, chest tightness x 1 week with associated nasal congestion/PND. Sent in prednisone taper. She is on 2L continuous at home and 4L pulsed. She is compliant with BREO 200 once daily. Her breathing has varied over the last year. States that she has good days and bad days. She did improve with oral steriod but symptoms returned soon after after stopped. Reports post nasal drip with oxygen use. She takes zyrtec daily.  Spirometry: 08/06/2018- FVC 1.3 (51%), FEV1 0.9 (47%), ratio 69   Allergies  Allergen Reactions  . Codeine Other (See Comments)    "Spaces out" the patient and she cannot walk well  . Vesicare [Solifenacin] Itching    Immunization History  Administered Date(s) Administered  . DTaP 08/03/2003  . Fluad Quad(high Dose 65+) 07/29/2019   . Influenza, High Dose Seasonal PF 06/15/2017, 09/02/2018  . Influenza,inj,Quad PF,6+ Mos 09/29/2013, 07/19/2014, 09/26/2015, 09/13/2016  . Influenza-Unspecified 11/04/2012  . PFIZER SARS-COV-2 Vaccination 01/01/2020, 01/31/2020  . Pneumococcal Conjugate-13 09/26/2015  . Pneumococcal Polysaccharide-23 12/01/1993, 08/19/2017    Past Medical History:  Diagnosis Date  . Allergy   . Anemia, unspecified   . Arthritis   . Asthma   . Atrial fibrillation status post cardioversion ([facility] 09/2015   s/p TEE/DCCV>>SR on amio  . Benign neoplasm of colon   . Carpal tunnel syndrome   . Cellulitis and abscess of finger, unspecified   . Cerumen impaction   . Cervicalgia   . [place]   . Chronic airway obstruction, not elsewhere classified   . Chronic anticoagulation 09/2015   Eliquis  . Diarrhea   . Diffuse cystic mastopathy   . Edema   . Encounter for long-term (current) use of other medications   . GERD (gastroesophageal reflux disease)   . Heart murmur   . Lumbago   . Neck pain 05/23/2015  . Obesity, unspecified   . Other and unspecified hyperlipidemia   . Other psoriasis   . Pain in joint, lower leg   . PONV (postoperative nausea and vomiting)   . Primary localized osteoarthrosis of right shoulder 12/18/2015  . 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 Scoliosis (and kyphoscoliosis), idiopathic   . Shortness of breath dyspnea  with exertion  . Type II or unspecified type diabetes mellitus without mention of complication, uncontrolled   . Unspecified essential hypertension   . Unspecified hemorrhoids without mention of complication   . Unspecified hypothyroidism   . Urge incontinence     Tobacco History: Social History   Tobacco Use  Smoking Status Former Smoker  . Packs/day: 1.00  . Years: 40.00  . Pack years: 40.00  .  Quit date: 09/07/1989  . Years since quitting: 30.5  Smokeless Tobacco Never Used   Counseling given: Not Answered   Outpatient Medications Prior to Visit  Medication Sig Dispense Refill  . acetaminophen (TYLENOL) 650 MG CR tablet Take 1,300 mg by mouth every 8 (eight) hours as needed for pain.    Marland Kitchen albuterol (PROVENTIL) (2.5 MG/3ML) 0.083% nebulizer solution USE 1 VIAL IN NEBULIZER EVERY 4 HOURS AND AS NEEDED. Generic: VENTOLIN 60 vial 10  . albuterol (VENTOLIN HFA) 108 (90 Base) MCG/ACT inhaler Inhale 2 puffs into the lungs every 6 (six) hours as needed for wheezing or shortness of breath. 6.7 g 3  . amiodarone (PACERONE) 200 MG tablet Take 1 tablet (200 mg total) by mouth daily. 30 tablet 2  . Blood Glucose Monitoring Suppl (ONETOUCH VERIO) w/Device KIT Check blood sugar twice daily DX:E11.9 1 kit 0  . BREO ELLIPTA 200-25 MCG/INH AEPB INHALE 1 PUFF INTO THE LUNGS DAILY 60 each 1  . cetirizine (ZYRTEC) 10 MG tablet Take 10 mg by mouth daily as needed for allergies.     . cholecalciferol (VITAMIN D) 1000 units tablet Take 1,000 Units by mouth at bedtime.     Marland Kitchen Dextromethorphan-guaiFENesin (MUCINEX DM) 30-600 MG TB12 Take 2 tablets by mouth as needed.    . doxazosin (CARDURA) 4 MG tablet TAKE 1 TABLET BY MOUTH EVERY DAY TO HELP CONTROL BLOOD PRESSURE 90 tablet 1  . ferrous sulfate 325 (65 FE) MG EC tablet Take 325 mg by mouth 2 (two) times daily.    . furosemide (LASIX) 40 MG tablet Take 2 tablets (80 mg total) by mouth every morning. Take an extra 40 mg tablet every Monday, Wednesday, and Friday at lunch time with one extra potassium tablet. 90 tablet 3  . glimepiride (AMARYL) 1 MG tablet TAKE 1 TABLET(1 MG) BY MOUTH DAILY WITH BREAKFAST 30 tablet 1  . glucose blood test strip 1 each by Other route 2 (two) times daily. OneTouch Verio DX E11.8 200 each 11  . JANUVIA 50 MG tablet TAKE 1 TABLET(50 MG) BY MOUTH DAILY 30 tablet 6  . Lancets (ONETOUCH ULTRASOFT) lancets 1 each by Other route 2  (two) times daily. OneTouch Verio DX E11.9 200 each 11  . levothyroxine (SYNTHROID) 88 MCG tablet TAKE 1 TABLET BY MOUTH DAILY ON AN EMPTY STOMACH 90 tablet 1  . OXYGEN Inhale 2 L into the lungs continuous.    . pantoprazole (PROTONIX) 40 MG tablet TAKE 1 TABLET(40 MG) BY MOUTH TWICE DAILY 180 tablet 1  . potassium gluconate 595 (99 K) MG TABS tablet Take 595 mg by mouth 2 (two) times daily. Take one extra tablet on Monday, Wednesday, and Friday with extra 40 mg Lasix dose.    . rosuvastatin (CRESTOR) 10 MG tablet TAKE 1 TABLET(10 MG) BY MOUTH DAILY 90 tablet 0  . predniSONE (DELTASONE) 10 MG tablet Take 4 tabs po daily x 2 days; then 3 tabs for 2 days; then 2 tabs for 2 days; then 1 tab for 2 days 20 tablet 0   No facility-administered  medications prior to visit.   Review of Systems  Review of Systems  Respiratory: Positive for cough, chest tightness and shortness of breath.   Cardiovascular: Negative.     Physical Exam  BP (!) 168/74 (BP Location: Left Arm, Cuff Size: Large)   Pulse 63   Temp (!) 97.3 F (36.3 C) (Temporal)   Ht _0  (1.6 m)   Wt 207 lb (93.9 kg)   SpO2 96%   BMI 36.67 kg/m  Physical Exam Constitutional:      Appearance: Normal appearance.  HENT:     Head: Normocephalic and atraumatic.     Right Ear: There is impacted cerumen.     Left Ear: Tympanic membrane and ear canal normal.     Mouth/Throat:     Mouth: Mucous membranes are moist.     Pharynx: Oropharynx is clear.  Cardiovascular:     Rate and Rhythm: Normal rate and regular rhythm.  Pulmonary:     Effort: Pulmonary effort is normal.     Breath sounds: No wheezing.     Comments: Lungs diminished  Neurological:     General: No focal deficit present.     Mental Status: She is alert and oriented to person, place, and time. Mental status is at baseline.  Psychiatric:        Mood and Affect: Mood normal.        Behavior: Behavior normal.        Thought Content: Thought content normal.         Judgment: Judgment normal.      Lab Results:  CBC    Component Value Date/Time   WBC 6.4 02/03/2020 0906   RBC 3.78 (L) 02/03/2020 0906   HGB 10.3 (L) 02/03/2020 0906   HGB 10.3 (L) 08/09/2019 1044   HCT 33.0 (L) 02/03/2020 0906   HCT 32.1 (L) 08/09/2019 1044   PLT 162 02/03/2020 0906   PLT 194 08/09/2019 1044   MCV 87.3 02/03/2020 0906   MCV 87 08/09/2019 1044   MCH 27.2 02/03/2020 0906   MCHC 31.2 (L) 02/03/2020 0906   RDW 14.8 02/03/2020 0906   RDW 15.1 08/09/2019 1044   LYMPHSABS 1,139 02/03/2020 0906   LYMPHSABS 2.0 04/28/2016 0940   MONOABS 0.5 07/25/2019 1805   EOSABS 122 02/03/2020 0906   EOSABS 0.2 04/28/2016 0940   BASOSABS 38 02/03/2020 0906   BASOSABS 0.0 04/28/2016 0940    BMET    Component Value Date/Time   NA 139 02/03/2020 0906   NA 144 08/09/2019 1044   K 4.3 02/03/2020 0906   CL 95 (L) 02/03/2020 0906   CO2 31 02/03/2020 0906   GLUCOSE 168 (H) 02/03/2020 0906   BUN 52 (H) 02/03/2020 0906   BUN 38 (H) 08/09/2019 1044   CREATININE 1.87 (H) 02/03/2020 0906   CALCIUM 9.0 02/03/2020 0906   GFRNONAA 25 (L) 02/03/2020 0906   GFRAA 29 (L) 02/03/2020 0906    BNP    Component Value Date/Time   BNP 943.8 (H) 07/25/2019 1805    ProBNP    Component Value Date/Time   PROBNP 835.0 (H) 07/15/2019 1222    Imaging: MM 3D SCREEN BREAST BILATERAL  Result Date: 02/15/2020 CLINICAL DATA:  Screening. EXAM: DIGITAL SCREENING BILATERAL MAMMOGRAM WITH TOMO AND CAD COMPARISON:  Previous exam(s). ACR Breast Density Category b: There are scattered areas of fibroglandular density. FINDINGS: There are no findings suspicious for malignancy. Images were processed with CAD. IMPRESSION: No mammographic evidence of malignancy. A result letter  of this screening mammogram will be mailed directly to the patient. RECOMMENDATION: Screening mammogram in one year. (Code:SM-B-01Y) BI-RADS CATEGORY  1: Negative. Electronically Signed   By: Kristopher Oppenheim M.D.   On: 02/15/2020  15:13     Assessment & Plan:   COPD (chronic obstructive pulmonary disease) (HCC) - Mixed COPD/asthma. Increased shortness of breath with associated chest tightness and wheezing over the last several months. Treated for acute exacerbation, improved with steriods but symptoms returned.  - Change BREO to Trelegy 200 (sample given) - Follow-up in 1 month   Chronic respiratory failure with hypoxia (HCC) - O2 after 2 laps sustaining 98-99% on 2L - Advised patient decreased oxygen to 1L continuous on exertion as needed  - Referral to add humidification   Rhinitis - RX astelin 1 spray per nostril twice daily for post nasal drip    Martyn Ehrich, NP 03/02/2020

## 2020-03-02 NOTE — Patient Instructions (Addendum)
Recommendations: You can use 1-2 L continuous oxygen at home   Orders: Add humidification to oxygen   RX: Sample Trelegy 200- take one puff daily (this replaces BREO) Sending in prescription in for Astelin nasal spray   Ask insurance if they covers: Trelegy, Brezti or Spiriva and notify office   Follow-up: 1 month with [employer]

## 2020-03-05 NOTE — Addendum Note (Signed)
Addended by: Gavin Potters R on: 03/05/2020 04:09 PM   Modules accepted: Orders

## 2020-03-14 ENCOUNTER — Telehealth: Payer: Self-pay | Admitting: Primary Care

## 2020-03-14 MED ORDER — TRELEGY ELLIPTA 200-62.5-25 MCG/INH IN AEPB: 1.0000 | INHALATION_SPRAY | Freq: Every day | RESPIRATORY_TRACT | 5 refills | Status: DC

## 2020-03-14 NOTE — Telephone Encounter (Signed)
Spoke with pt and advised rx sent to pharmacy. Nothing further is needed.    Patient Instructions by Martyn Ehrich, NP at 03/02/2020 2:30 PM Author: Martyn Ehrich, NP Author Type: Nurse Practitioner Filed: 03/02/2020 3:11 PM  Note Status: Addendum Cosign: Cosign Not Required Encounter Date: 03/02/2020  Editor: Martyn Ehrich, NP (Nurse Practitioner)  Prior Versions: 1. Martyn Ehrich, NP (Nurse Practitioner) at 03/02/2020 3:09 PM - Addendum   2. Martyn Ehrich, NP (Nurse Practitioner) at 03/02/2020 2:49 PM - Addendum   3. Martyn Ehrich, NP (Nurse Practitioner) at 03/02/2020 2:44 PM - Signed    Recommendations: You can use 1-2 L continuous oxygen at home   Orders: Add humidification to oxygen   RX: Sample Trelegy 200- take one puff daily (this replaces BREO) Sending in prescription in for Astelin nasal spray   Ask insurance if they covers: Trelegy, Brezti or Spiriva and notify office   Follow-up: 1 month with [employer]

## 2020-03-15 ENCOUNTER — Other Ambulatory Visit: Payer: Self-pay | Admitting: Nurse Practitioner

## 2020-03-15 ENCOUNTER — Other Ambulatory Visit: Payer: Self-pay | Admitting: Gastroenterology

## 2020-03-15 DIAGNOSIS — I10 Essential (primary) hypertension: Secondary | ICD-10-CM

## 2020-03-15 DIAGNOSIS — R197 Diarrhea, unspecified: Secondary | ICD-10-CM

## 2020-03-15 DIAGNOSIS — D509 Iron deficiency anemia, unspecified: Secondary | ICD-10-CM | POA: Diagnosis not present

## 2020-03-15 DIAGNOSIS — R1030 Lower abdominal pain, unspecified: Secondary | ICD-10-CM | POA: Diagnosis not present

## 2020-03-21 ENCOUNTER — Telehealth: Payer: Self-pay | Admitting: Primary Care

## 2020-03-21 NOTE — Telephone Encounter (Signed)
Left message for patient to call back.

## 2020-03-22 NOTE — Telephone Encounter (Signed)
LMTCB x2 for pt 

## 2020-03-22 NOTE — Telephone Encounter (Signed)
Called and spoke with pt who stated she began to have worsening SOB x6 days ago. Pt states SOB is bad now at any time but is worse with activities.   Pt states she has been using her albuterol inhaler at least 2-3 times daily and has been using her nebulizer at least 4-5 times daily. Pt is using all meds as prescribed.  Pt denies any complaints of fever as she states she does not have any chills, does not feel hot. Pt has not actually checked her temp.  Pt states she also has complaints of tightness in chest and also has some wheezing.  Pt is using her O2 24/7 at 2L and states her O2 sats have been ranging in the 90s when at rest but with exertion, she states sats are dropping to the mid 80s on her O2. Pt states when sats do drop to the 80s on her O2, she will sit down to rest and then sats will go back up to the 90s.   Stated to pt that we might need to bring her in for a visit to reevaluate and after stating that, pt declined scheduling an appt since she was just in the office 5/7.  Pt then said that she would wait and see if this continued but I stated to her I was going to send to [facility] for advise. Pt verbalized understanding. Pt is aware that Eustaquio Maize is out of office remainder of day today 5/27 and that she would return tomorrow 5/28.  Routing to [employer].

## 2020-03-22 NOTE — Telephone Encounter (Signed)
Pt returning a phone call. Pt can be reached at [phone].

## 2020-03-23 MED ORDER — PREDNISONE 10 MG PO TABS: ORAL_TABLET | ORAL | 0 refills | Status: DC

## 2020-03-23 NOTE — Telephone Encounter (Signed)
Ok to send in prednisone taper. If has another flare needs visit.

## 2020-03-23 NOTE — Telephone Encounter (Signed)
Attempted to call pt but unable to reach. Left message for her to return call. 

## 2020-03-23 NOTE — Telephone Encounter (Signed)
Patient is returning phone call. Patient states is feeling better. Does not need the medicine. Patient phone number is [phone]. States does not need to return call.

## 2020-03-23 NOTE — Telephone Encounter (Signed)
Noted. Will close encounter.

## 2020-03-28 ENCOUNTER — Other Ambulatory Visit: Payer: Self-pay | Admitting: Nurse Practitioner

## 2020-03-28 DIAGNOSIS — E1165 Type 2 diabetes mellitus with hyperglycemia: Secondary | ICD-10-CM

## 2020-03-29 ENCOUNTER — Other Ambulatory Visit (HOSPITAL_COMMUNITY): Payer: Self-pay

## 2020-03-30 ENCOUNTER — Ambulatory Visit
Admission: RE | Admit: 2020-03-30 | Discharge: 2020-03-30 | Disposition: A | Discharge status: Home / Self Care | Payer: Medicare Other | Source: Ambulatory Visit | Attending: Gastroenterology | Admitting: Gastroenterology

## 2020-03-30 ENCOUNTER — Ambulatory Visit (HOSPITAL_COMMUNITY)
Admission: RE | Admit: 2020-03-30 | Discharge: 2020-03-30 | Disposition: A | Discharge status: Home / Self Care | Payer: Medicare Other | Source: Ambulatory Visit | Attending: Gastroenterology | Admitting: Gastroenterology

## 2020-03-30 ENCOUNTER — Other Ambulatory Visit: Payer: Self-pay

## 2020-03-30 ENCOUNTER — Other Ambulatory Visit: Payer: Self-pay | Admitting: Gastroenterology

## 2020-03-30 ENCOUNTER — Other Ambulatory Visit: Payer: Medicare Other

## 2020-03-30 DIAGNOSIS — R1030 Lower abdominal pain, unspecified: Secondary | ICD-10-CM

## 2020-03-30 DIAGNOSIS — J449 Chronic obstructive pulmonary disease, unspecified: Secondary | ICD-10-CM | POA: Diagnosis not present

## 2020-03-30 DIAGNOSIS — R197 Diarrhea, unspecified: Secondary | ICD-10-CM

## 2020-03-30 DIAGNOSIS — D500 Iron deficiency anemia secondary to blood loss (chronic): Secondary | ICD-10-CM | POA: Insufficient documentation

## 2020-03-30 MED ORDER — SODIUM CHLORIDE 0.9 % IV SOLN
510.0000 mg | Freq: Once | INTRAVENOUS | Status: DC
  Administered 2020-03-30: 510 mg via INTRAVENOUS
  Filled 2020-03-30: qty 510

## 2020-04-02 ENCOUNTER — Other Ambulatory Visit: Payer: Self-pay | Admitting: Nurse Practitioner

## 2020-04-02 DIAGNOSIS — E785 Hyperlipidemia, unspecified: Secondary | ICD-10-CM

## 2020-04-05 NOTE — Progress Notes (Deleted)
Cardiology Office Note   Date:  04/05/2020   ID:  Patricia [name], Patricia [name] [birth year], MRN [id number]  PCP:  Patricia Chandler, NP  Cardiologist:   Patricia Kohn [place], MD   No chief complaint on file.     History of Present Illness: Patricia [name] is a 84 y.o. female who is seen for follow up Afib.  She has a history of HTN, obesity, DM, HL, and anemia. She was admitted to the hospital in December 2016 with increased dyspnea. She was found to be in atrial fibrillation with RVR. She had pulmonary edema. Hgb was 10. Echo showed EF 55-60% with moderate to severe AS. She was anticoagulated with Xarelto and started on amiodarone. She had TEE guided DCCV with return to NSR.   On 12/18/15 she underwent right shoulder arthroplasty. Post op Hgb was 9.6. It subsequently dropped to 7.1. She was transfused. She has been on iron. Hgb improved to 10. She did have EGD and colonoscopy with both gastric and colonic polyps. No bleeding seen.   On follow up in October 2017 she noted symptoms of increased dyspnea.  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 Mild pulmonary HTN and elevated filling pressures. She was admitted in November with acute CHF and was diuresed. She was evaluated in valve clinic and underwent transfemoral TAVR on 10/14/16. Post op course was uncomplicated. Repeat Echo in January  Showed higher than expected AV prosthesis gradient felt to be related to vigorous LV systolic function and Valve/ patient mismatch. She has been on  Eliquis. She has noted moderate improvement in her breathing.   She has chronic COPD and bronchitis- followed by primary care and pulmonary.  In March 2018 she was noted to have progressive anemia with dark stools. Hgb down to 7.4. She was transfused and EGD showed multiple gastric polyps and some oozing AVMs that were cauterized. Once again she was admitted in May with worsening anemia. Found to have 2 oozing duodenal  AVMs that were treated. Transfused 1 units PRBCs. Plan for outpatient capsule endoscopy.   She was seen January 2019 with complaints of cough that did not improve with stopping ACEi. Some pulmonary crackles noted at the time. Pulse rate 70 then. CT chest in December showed emphysema and periapical scarring but no acute infiltrates. Seen later in January with increased SOB and rapid HR. She was found to be in Afib. She was admitted and amiodarone dose increased she underwent successful DCCV. Continued on Eliquis.   She was admitted March 27-April 2 with abdominal pain and severe weakness. Hgb down to 4.2. Creatinine up to 2.34. Patient was admitted to the medical ward, she received 4 units of packed red blood cells transfusion and anticoagulation was held. Patient was given proton pump inhibitors for antiacid therapy. Her hemoglobin and hematocrit stabilized with a discharge hemoglobin of 8.4 and hematocrit 27.1. No further signs of bleeding. Further workup with upper endoscopy showed normal esophagus, erosive gastropathy, normal duodenum. CT of the abdomen with no source of bleeding. She underwent capsule endoscopy. This did not show any bleeding source.  She did require IV lasix for volume overload. Creatinine improved to 1.45. Hgb 11.3 on last evaluation. Given bleeding risk anticoagulation was discontinued.   She was recently discharged from the hospital on 07/29/2019.  She was seen and evaluated for acute respiratory failure with hypoxia, which was felt to be multifactorial given his obesity, COPD, and acute on chronic systolic  heart failure.  She was also found to be in atrial fibrillation which was suspected to be the etiology of her worsening LV function and exacerbation of heart failure.  She underwent TEE guided cardioversion on 07/28/2019 with successful conversion to normal sinus rhythm.  She was anticoagulated on apixaban 2.5 mg twice daily for  4 weeks.  She has a history of profound anemia on  anticoagulation in the past and so anticoagulation was discontinued after that.   Echocardiogram on 07/27/2019 with an EF of 25 to 30%, LVH with diffuse hypokinesis with septal dyskinesis, severely dilated left atrium, mild mitral stenosis, and no significant aortic regurgitation. Most recent Echo in January 2021 showed improvement in EF to 40-45%.   On follow up today she states her breathing is poor. Increased use of nebulizers.  Particularly bad when it rains. Weight is up and down but she has no edema.  She reports rhythm has been normal. She is still on home oxygen.  No chest pain or dizziness.      Past Medical History:  Diagnosis Date  . Allergy   . Anemia, unspecified   . Arthritis   . Asthma   . Atrial fibrillation status post cardioversion [facility]) 09/2015   s/p TEE/DCCV>>SR on amio  . Benign neoplasm of colon   . Carpal tunnel syndrome   . Cellulitis and abscess of finger, unspecified   . Cerumen impaction   . Cervicalgia   . CHF (congestive heart failure) ([place])   . Chronic airway obstruction, not elsewhere classified   . Chronic anticoagulation 09/2015   Eliquis  . Diarrhea   . Diffuse cystic mastopathy   . Edema   . Encounter for long-term (current) use of other medications   . GERD (gastroesophageal reflux disease)   . Heart murmur   . Lumbago   . Neck pain 05/23/2015  . Obesity, unspecified   . Other and unspecified hyperlipidemia   . Other psoriasis   . Pain in joint, lower leg   . PONV (postoperative nausea and vomiting)   . Primary localized osteoarthrosis of right shoulder 12/18/2015  . 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 Scoliosis (and kyphoscoliosis), idiopathic   . Shortness of breath dyspnea    with exertion  . Type II or unspecified type diabetes mellitus without mention of complication, uncontrolled   . Unspecified  essential hypertension   . Unspecified hemorrhoids without mention of complication   . Unspecified hypothyroidism   . Urge incontinence     Past Surgical History:  Procedure Laterality Date  . ABDOMINAL HYSTERECTOMY  1987   complete  . BREAST BIOPSY Right   . BREAST EXCISIONAL BIOPSY Left 2011  . BREAST EXCISIONAL BIOPSY Right    x2  . BREAST SURGERY     right breast 3 surgeries [phone]  . CARDIAC CATHETERIZATION N/A 09/02/2016   Procedure: Right/Left Heart Cath and Coronary Angiography;  Surgeon: Valyn Latchford M Martinique, MD;  Location: [place] CV LAB;  Service: Cardiovascular;  Laterality: N/A;  . CARDIOVERSION N/A 10/19/2015   Procedure: CARDIOVERSION;  Surgeon: Lelon Perla, MD;  Location: [facility] ENDOSCOPY;  Service: Cardiovascular;  Laterality: N/A;  . CARDIOVERSION N/A 11/13/2017   Procedure: CARDIOVERSION;  Surgeon: Jerline Pain, MD;  Location: [facility] ENDOSCOPY;  Service: Cardiovascular;  Laterality: N/A;  . CARDIOVERSION N/A 07/28/2019   Procedure: CARDIOVERSION;  Surgeon: Buford Dresser, MD;  Location: [place];  Service: Cardiovascular;  Laterality: N/A;  . [place]  . COLON SURGERY     2004,2007,2013.  3 times colon surgieres  . ESOPHAGOGASTRODUODENOSCOPY (EGD) WITH PROPOFOL Left 03/13/2017   Procedure: ESOPHAGOGASTRODUODENOSCOPY (EGD) WITH PROPOFOL;  Surgeon: Ronnette Juniper, MD;  Location: [place];  Service: Gastroenterology;  Laterality: Left;  . ESOPHAGOGASTRODUODENOSCOPY (EGD) WITH PROPOFOL Left 01/22/2018   Procedure: ESOPHAGOGASTRODUODENOSCOPY (EGD) WITH PROPOFOL;  Surgeon: Arta Silence, MD;  Location: [facility] ENDOSCOPY;  Service: Endoscopy;  Laterality: Left;  . EXCISE LE MANDIBULAR LYMPH NODE T     DR C. NEWMAN  . FOOT SURGERY  1989  . GIVENS CAPSULE STUDY Left 01/23/2018   Procedure: GIVENS CAPSULE STUDY;  Surgeon: Wilford Corner, MD;  Location: [facility] ENDOSCOPY;  Service: Endoscopy;  Laterality: Left;  . LEFT SHOULDER ARTHROSCOPY     . 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 NEUROPLASTY / TRANSPOSITION MEDIAN NERVE AT CARPAL TUNNEL BILATERAL  05/2003  . TEE WITHOUT CARDIOVERSION N/A 10/19/2015   Procedure: Transesophageal Echocardiogram (TEE) ;  Surgeon: Lelon Perla, MD;  Location: [facility] ENDOSCOPY;  Service: Cardiovascular;  Laterality: N/A;  . TEE WITHOUT CARDIOVERSION N/A 10/14/2016   Procedure: TRANSESOPHAGEAL ECHOCARDIOGRAM (TEE);  Surgeon: Sherren Mocha, MD;  Location: [place];  Service: Open Heart Surgery;  Laterality: N/A;  . TEE WITHOUT CARDIOVERSION N/A 07/28/2019   Procedure: TRANSESOPHAGEAL ECHOCARDIOGRAM (TEE);  Surgeon: Buford Dresser, MD;  Location[facility]COPY;  Service: Cardiovascular;  Laterality: N/A;  . TOTAL SHOULDER ARTHROPLASTY Right 12/18/2015   Procedure: TOTAL SHOULDER ARTHROPLASTY;  Surgeon: [name], MD;  Location: [place];  Service: Orthopedics;  Laterality: Right;  . TRANSCATHETER AORTIC VALVE REPLACEMENT, TRANSFEMORAL N/A 10/14/2016   Procedure: TRANSCATHETER AORTIC VALVE REPLACEMENT, TRANSFEMORAL;  Surgeon: Sherren Mocha, MD;  Location: [place];  Service: Open Heart Surgery;  Laterality: N/A;  . TRIGGER FINGER RELEASE Left   . VAGINAL CYST REMOVED  1967     Current Outpatient Medications  Medication Sig Dispense Refill  . rosuvastatin (CRESTOR) 10 MG tablet TAKE 1 TABLET(10 MG) BY MOUTH DAILY 90 tablet 1  . acetaminophen (TYLENOL) 650 MG CR tablet Take 1,300 mg by mouth every 8 (eight) hours as needed for pain.    Marland Kitchen albuterol (PROVENTIL) (2.5 MG/3ML) 0.083% nebulizer solution USE 1 VIAL IN NEBULIZER EVERY 4 HOURS AND AS NEEDED. Generic: VENTOLIN 60 vial 10  . albuterol (VENTOLIN HFA) 108 (90 Base) MCG/ACT inhaler Inhale 2 puffs into the lungs every 6 (six) hours as needed for wheezing or shortness of breath. 6.7 g 3  . amiodarone (PACERONE) 200 MG tablet Take 1 tablet (200 mg total) by mouth daily. 30 tablet 2  . azelastine (ASTELIN) 0.1 % nasal spray Place 1 spray into both nostrils 2  (two) times daily. Use in each nostril as directed 30 mL 1  . Blood Glucose Monitoring Suppl (ONETOUCH VERIO) w/Device KIT Check blood sugar twice daily DX:E11.9 1 kit 0  . cetirizine (ZYRTEC) 10 MG tablet Take 10 mg by mouth daily as needed for allergies.     . cholecalciferol (VITAMIN D) 1000 units tablet Take 1,000 Units by mouth at bedtime.     Marland Kitchen Dextromethorphan-guaiFENesin (MUCINEX DM) 30-600 MG TB12 Take 2 tablets by mouth as needed.    . doxazosin (CARDURA) 4 MG tablet TAKE 1 TABLET BY MOUTH EVERY DAY TO HELP CONTROL BLOOD PRESSURE 90 tablet 1  . ferrous sulfate 325 (65 FE) MG EC tablet Take 325 mg by mouth 2 (two) times daily.    Marland Kitchen  Fluticasone-Umeclidin-Vilant (TRELEGY ELLIPTA) 200-62.5-25 MCG/INH AEPB Inhale 1 puff into the lungs daily. 28 each 5  . furosemide (LASIX) 40 MG tablet Take 2 tablets (80 mg total) by mouth every morning. Take an extra 40 mg tablet every Monday, Wednesday, and Friday at lunch time with one extra potassium tablet. 90 tablet 3  . glimepiride (AMARYL) 1 MG tablet TAKE 1 TABLET(1 MG) BY MOUTH DAILY WITH BREAKFAST 90 tablet 1  . glucose blood test strip 1 each by Other route 2 (two) times daily. OneTouch Verio DX E11.8 200 each 11  . JANUVIA 50 MG tablet TAKE 1 TABLET(50 MG) BY MOUTH DAILY 30 tablet 6  . Lancets (ONETOUCH ULTRASOFT) lancets 1 each by Other route 2 (two) times daily. OneTouch Verio DX E11.9 200 each 11  . levothyroxine (SYNTHROID) 88 MCG tablet TAKE 1 TABLET BY MOUTH DAILY ON AN EMPTY STOMACH 90 tablet 1  . OXYGEN Inhale 2 L into the lungs continuous.    . pantoprazole (PROTONIX) 40 MG tablet TAKE 1 TABLET(40 MG) BY MOUTH TWICE DAILY 180 tablet 1  . potassium gluconate 595 (99 K) MG TABS tablet Take 595 mg by mouth 2 (two) times daily. Take one extra tablet on Monday, Wednesday, and Friday with extra 40 mg Lasix dose.    . predniSONE (DELTASONE) 10 MG tablet Take 4 tabs po daily x 3 days; then 3 tabs daily x3 days; then 2 tabs daily x3 days; then 1  tab daily x 3 days; then stop 30 tablet 0   No current facility-administered medications for this visit.    Allergies:   Codeine and Vesicare [solifenacin]    Social History:  The patient  reports that she quit smoking about 30 years ago. She has a 40.00 pack-year smoking history. She has never used smokeless tobacco. She reports that she does not drink alcohol and does not use drugs.   Family History:  The patient's family history includes Arthritis in her sister; Asthma in her sister; Diabetes in her father; Heart disease in her brother and father; Liver cancer in her sister; Stroke in her father.    ROS:  Please see the history of present illness.   Otherwise, review of systems are positive for none.   All other systems are reviewed and negative.    PHYSICAL EXAM: VS:  There were no vitals taken for this visit. , BMI There is no height or weight on file to calculate BMI. GENERAL:  Chronically ill appearing obese WF in NAD. On oxygen. HEENT:  PERRL, EOMI, sclera are clear. Oropharynx is clear. NECK:  No jugular venous distention, carotid upstroke brisk and symmetric, no bruits, no thyromegaly or adenopathy LUNGS:  Clear to auscultation bilaterally but poor air movement.  CHEST:  Unremarkable HEART:  RRR,  PMI not displaced or sustained,S1 and S2 within normal limits, no S3, no S4: no clicks, no rubs, gr 29/2 systolic murmur RUSB.  ABD:  Soft, nontender. BS +, no masses or bruits. No hepatomegaly, no splenomegaly EXT:  2 + pulses throughout, tr-1+ pedal edema, no cyanosis no clubbing SKIN:  Warm and dry.  No rashes NEURO:  Alert and oriented x 3. Cranial nerves II through XII intact. PSYCH:  Cognitively intact   EKG:  EKG is not ordered today.    Recent Labs: Lab Results  Component Value Date   WBC 6.4 02/03/2020   HGB 10.3 (L) 02/03/2020   HCT 33.0 (L) 02/03/2020   PLT 162 02/03/2020   GLUCOSE 168 (H) 02/03/2020  CHOL 156 11/07/2019   TRIG 138 11/07/2019   HDL 73  11/07/2019   LDLCALC 61 11/07/2019   ALT 15 11/07/2019   AST 16 11/07/2019   NA 139 02/03/2020   K 4.3 02/03/2020   CL 95 (L) 02/03/2020   CREATININE 1.87 (H) 02/03/2020   BUN 52 (H) 02/03/2020   CO2 31 02/03/2020   TSH 3.91 02/03/2020   INR 1.44 01/20/2018   HGBA1C 7.2 (H) 02/03/2020   MICROALBUR 6.3 06/11/2017      Lipid Panel    Component Value Date/Time   CHOL 156 11/07/2019 0926   CHOL 182 04/28/2016 0940   TRIG 138 11/07/2019 0926   HDL 73 11/07/2019 0926   HDL 61 04/28/2016 0940   CHOLHDL 2.1 11/07/2019 0926   VLDL 19 06/11/2017 0832   LDLCALC 61 11/07/2019 0926      Wt Readings from Last 3 Encounters:  03/02/20 207 lb (93.9 kg)  02/08/20 210 lb (95.3 kg)  12/05/19 215 lb (97.5 kg)      Other studies Reviewed: Additional studies/ records that were reviewed today include:   Echo 10/14/17: Study Conclusions  - Left ventricle: The cavity size was normal. Wall thickness was   increased in a pattern of moderate LVH. Systolic function was   normal. The estimated ejection fraction was in the range of 60%   to 65%. Wall motion was normal; there were no regional wall   motion abnormalities. Diastolic dysfunction with elevated LV   filling pressure. - Aortic valve: s/p TAVR. No obstruction or paravalvular leak. Mean   gradient (S): 24 mm Hg. Peak gradient (S): 47 mm Hg. Valve area   (VTI): 1.19 cm^2. Valve area (Vmax): 1.09 cm^2. Valve area   (Vmean): 1.07 cm^2. - Mitral valve: Calcified annulus. Mildly thickened leaflets . Mild   stenosis. There was moderate regurgitation. - Left atrium: Severely dilated. - Right atrium: Mildly dilated. - Tricuspid valve: There was mild regurgitation. - Pulmonary arteries: PA peak pressure: 51 mm Hg (S). - Inferior vena cava: The vessel was normal in size. The   respirophasic diameter changes were in the normal range (>= 50%),   consistent with normal central venous pressure.  Impressions:  - Compared to a prior  study in 10/2016, the TAVR valve appears   stable.  TEE/DCCV 07/28/19: FINDINGS:  LEFT VENTRICLE: EFreduced, visual estimate 35%. Global hypokinesis.  RIGHT VENTRICLE: Normal size and function.   LEFT ATRIUM: No thrombus/mass.  LEFT ATRIAL APPENDAGE: No thrombus/mass.   RIGHT ATRIUM: No thrombus/mass.  AORTIC VALVE:S/P TAVR. Valve leaflets well visualized, very trivial centralregurgitation. No vegetation.  MITRAL VALVE:Moderately calcified annulus and calcified leaflets.Mildregurgitation. No vegetation.  TRICUSPID VALVE: Normal structure.Moderateregurgitation. No vegetation.  PULMONIC VALVE: Grossly normal structure.Trivialregurgitation. No apparent vegetation.  INTERATRIAL SEPTUM:SmallPFO seen by color Doppler.  PERICARDIUM:Trivialeffusion noted.  DESCENDING AORTA: Moderate diffuse plaque seen  Echo 11/07/19: IMPRESSIONS    1. Left ventricular ejection fraction, by visual estimation, is 40 to  45%. The left ventricle has mildly decreased function. There is moderately  increased left ventricular hypertrophy.  2. Abnormal septal motion consistent with left bundle branch block.  3. Left ventricular diastolic function could not be evaluated.  4. The left ventricle demonstrates global hypokinesis.  5. Global right ventricle has normal systolic function.The right  ventricular size is normal. No increase in right ventricular wall  thickness.  6. 23 mm Edwards S3 TAVR. V max 3.5 m/s, MG 26 mmHG, DI 0.36, EOA 1.01  cm2. Gradients are increased but  stable compared with 10/14/2017. No  paravalvular leak.  7. Left atrial size was severely dilated.  8. Moderate to severe mitral annular calcification.  9. The mitral valve is degenerative. Moderate mitral valve regurgitation.  Mild mitral stenosis.  10. The tricuspid valve is grossly normal.  11. Aortic valve regurgitation is not visualized.  12. The pulmonic valve was grossly normal. Pulmonic  valve regurgitation is  trivial.  13. Moderately elevated pulmonary artery systolic pressure.  14. The inferior vena cava is dilated in size with <50% respiratory  variability, suggesting right atrial pressure of 15 mmHg.  15. The tricuspid regurgitant velocity is 3.59 m/s, and with an assumed  right atrial pressure of 15 mmHg, the estimated right ventricular systolic  pressure is moderately elevated at 66.6 mmHg.   In comparison to the previous echocardiogram(s): EF has improved to 40-45%  compared with 25-30% noted on 07/27/2019. TAVR gradients remain stable but  elevated when compared to 10/14/2019. TAVR gradients were lower on  07/27/2019 due to severely reduced EF.  Mild calcific mitral stenosis remains.   CARDIOVERSION:  Indications:Symptomatic Atrial Fibrillation  Procedure Details:  Once the TEE was complete, the patient had the defibrillator pads placed in the anterior and posterior position. Once an appropriate level of sedation was confirmed, the patient was cardioverted x1with 150J of biphasic synchronized energy. The patient converted to sinus bradycardia. There were no apparent complications. The patient had normal neuro status and respiratory status post procedure with vitals stable as recorded elsewhere. Adequate airway was maintained throughout and vital signs monitored per protocol.   ASSESSMENT AND PLAN:  1.  Atrial fibrillation. S/p DCCV in December 2016 and again January 2019. More recent admission in early October 2020 with respiratory failure and recurrent Afib. S/p TEE guided DCCV.  Mali Vasc score of 6. Now on higher dose of amiodarone 200 mg daily. She is maintaining NSR on exam.   Based on the fact she has 3 GI bleeding episodes last year I do not think she is a candidate for long term anticoagulation.  She is off Eliquis now.   2.  Severe Aortic stenosis. Echo in July 2017 showed progression with velocity > 4 cm/sec and mean gradient of 40 mm  Hg. Now s/p TAVR in December 2017. Follow up Echo 12/18 with higher than expected gradient (mean gradient of 29 mm Hg) due to patient/ prosthetic mismatch with small annuls size and vigorous LV function. More recent Echo showed stable valve prosthesis.   3. Anemia Fe deficient with GI blood loss secondary to AVMS and erosive gastropathy. GI work up as noted. Risk of recurrent bleeding is high. no anticoagulation or antiplatelet therapy. Recent Hgb 9.7  4. Chronic kidney disease stage 3. stable  5. S/p right shoulder arthroplasty.   6. Acute on Chronic systolic/diastolic CHF. Weight is stable without significant edema. Would continue current therapy. Fortunately EF improved to 40-45% post TAVR and restoration of NSR.  7. HTN  8. DM type 2.  Managed by primary care.  9. COPD. Recommend follow up with pulmonary  I will follow up in 4 months.   Signed, Armani Brar Martinique, MD  04/05/2020 7:47 AM    [facility] [address], [place], [place], [address] Phone [phone], Fax [phone]

## 2020-04-06 ENCOUNTER — Ambulatory Visit: Payer: Medicare Other | Admitting: Cardiology

## 2020-04-11 ENCOUNTER — Ambulatory Visit: Payer: Medicare Other | Admitting: Primary Care

## 2020-04-13 DIAGNOSIS — D500 Iron deficiency anemia secondary to blood loss (chronic): Secondary | ICD-10-CM | POA: Diagnosis not present

## 2020-04-17 ENCOUNTER — Ambulatory Visit
Admission: RE | Admit: 2020-04-17 | Discharge: 2020-04-17 | Disposition: A | Discharge status: Home / Self Care | Payer: Medicare Other | Source: Ambulatory Visit | Attending: Gastroenterology | Admitting: Gastroenterology

## 2020-04-17 ENCOUNTER — Other Ambulatory Visit: Payer: Self-pay

## 2020-04-17 DIAGNOSIS — R197 Diarrhea, unspecified: Secondary | ICD-10-CM

## 2020-04-17 DIAGNOSIS — N2889 Other specified disorders of kidney and ureter: Secondary | ICD-10-CM | POA: Diagnosis not present

## 2020-04-17 DIAGNOSIS — R1030 Lower abdominal pain, unspecified: Secondary | ICD-10-CM

## 2020-04-25 ENCOUNTER — Other Ambulatory Visit: Payer: Self-pay | Admitting: Gastroenterology

## 2020-04-25 DIAGNOSIS — N2889 Other specified disorders of kidney and ureter: Secondary | ICD-10-CM

## 2020-04-25 DIAGNOSIS — R935 Abnormal findings on diagnostic imaging of other abdominal regions, including retroperitoneum: Secondary | ICD-10-CM | POA: Diagnosis not present

## 2020-04-26 ENCOUNTER — Other Ambulatory Visit: Payer: Self-pay | Admitting: Gastroenterology

## 2020-04-26 DIAGNOSIS — N2889 Other specified disorders of kidney and ureter: Secondary | ICD-10-CM

## 2020-04-29 DIAGNOSIS — J449 Chronic obstructive pulmonary disease, unspecified: Secondary | ICD-10-CM | POA: Diagnosis not present

## 2020-05-11 ENCOUNTER — Ambulatory Visit (HOSPITAL_COMMUNITY)
Admission: RE | Admit: 2020-05-11 | Discharge: 2020-05-11 | Disposition: A | Discharge status: Home / Self Care | Payer: Medicare Other | Source: Ambulatory Visit | Attending: Gastroenterology | Admitting: Gastroenterology

## 2020-05-11 DIAGNOSIS — D500 Iron deficiency anemia secondary to blood loss (chronic): Secondary | ICD-10-CM | POA: Insufficient documentation

## 2020-05-11 MED ORDER — SODIUM CHLORIDE 0.9 % IV SOLN
510.0000 mg | Freq: Once | INTRAVENOUS | Status: DC
  Administered 2020-05-11: 510 mg via INTRAVENOUS
  Filled 2020-05-11: qty 17

## 2020-05-21 ENCOUNTER — Telehealth: Payer: Self-pay | Admitting: Internal Medicine

## 2020-05-21 NOTE — Telephone Encounter (Signed)
Spoke with the pt  She states received notice from [employer] that it's time to re certify for her o2.  Appt scheduled  Nothing further needed

## 2020-05-22 ENCOUNTER — Other Ambulatory Visit: Payer: Self-pay

## 2020-05-22 ENCOUNTER — Ambulatory Visit
Admission: RE | Admit: 2020-05-22 | Discharge: 2020-05-22 | Disposition: A | Discharge status: Home / Self Care | Payer: Medicare Other | Source: Ambulatory Visit | Attending: Gastroenterology | Admitting: Gastroenterology

## 2020-05-22 DIAGNOSIS — N2889 Other specified disorders of kidney and ureter: Secondary | ICD-10-CM

## 2020-05-23 ENCOUNTER — Encounter: Payer: Self-pay | Admitting: Internal Medicine

## 2020-05-23 ENCOUNTER — Ambulatory Visit (INDEPENDENT_AMBULATORY_CARE_PROVIDER_SITE_OTHER): Payer: Medicare Other | Admitting: Internal Medicine

## 2020-05-23 VITALS — BP 120/64 | HR 64 | Ht 63.0 in | Wt 217.8 lb

## 2020-05-23 DIAGNOSIS — J449 Chronic obstructive pulmonary disease, unspecified: Secondary | ICD-10-CM

## 2020-05-23 DIAGNOSIS — Z862 Personal history of diseases of the blood and blood-forming organs and certain disorders involving the immune mechanism: Secondary | ICD-10-CM | POA: Diagnosis not present

## 2020-05-23 DIAGNOSIS — R0602 Shortness of breath: Secondary | ICD-10-CM

## 2020-05-23 DIAGNOSIS — Z8709 Personal history of other diseases of the respiratory system: Secondary | ICD-10-CM

## 2020-05-23 DIAGNOSIS — J9611 Chronic respiratory failure with hypoxia: Secondary | ICD-10-CM

## 2020-05-23 DIAGNOSIS — N189 Chronic kidney disease, unspecified: Secondary | ICD-10-CM

## 2020-05-23 LAB — CBC WITH DIFFERENTIAL/PLATELET
Basophils Absolute: 0 10*3/uL (ref 0.0–0.1)
Basophils Relative: 0.7 % (ref 0.0–3.0)
Eosinophils Absolute: 0.1 10*3/uL (ref 0.0–0.7)
Eosinophils Relative: 1.5 % (ref 0.0–5.0)
HCT: 32.1 % — ABNORMAL LOW (ref 36.0–46.0)
Hemoglobin: 10.5 g/dL — ABNORMAL LOW (ref 12.0–15.0)
Lymphocytes Relative: 12.3 % (ref 12.0–46.0)
Lymphs Abs: 0.6 10*3/uL — ABNORMAL LOW (ref 0.7–4.0)
MCHC: 32.6 g/dL (ref 30.0–36.0)
MCV: 93.2 fl (ref 78.0–100.0)
Monocytes Absolute: 0.5 10*3/uL (ref 0.1–1.0)
Monocytes Relative: 9.6 % (ref 3.0–12.0)
Neutro Abs: 4 10*3/uL (ref 1.4–7.7)
Neutrophils Relative %: 75.9 % (ref 43.0–77.0)
Platelets: 136 10*3/uL — ABNORMAL LOW (ref 150.0–400.0)
RBC: 3.44 Mil/uL — ABNORMAL LOW (ref 3.87–5.11)
RDW: 17 % — ABNORMAL HIGH (ref 11.5–15.5)
WBC: 5.3 10*3/uL (ref 4.0–10.5)

## 2020-05-23 LAB — BASIC METABOLIC PANEL
BUN: 40 mg/dL — ABNORMAL HIGH (ref 6–23)
CO2: 39 mEq/L — ABNORMAL HIGH (ref 19–32)
Calcium: 9.6 mg/dL (ref 8.4–10.5)
Chloride: 97 mEq/L (ref 96–112)
Creatinine, Ser: 1.63 mg/dL — ABNORMAL HIGH (ref 0.40–1.20)
GFR: 30.22 mL/min — ABNORMAL LOW (ref >60.00)
Glucose, Bld: 211 mg/dL — ABNORMAL HIGH (ref 70–99)
Potassium: 4.2 mEq/L (ref 3.5–5.1)
Sodium: 139 mEq/L (ref 135–145)

## 2020-05-23 NOTE — Addendum Note (Signed)
Addended by: Suzzanne Cloud E on: 05/23/2020 12:11 PM   Modules accepted: Orders

## 2020-05-23 NOTE — Patient Instructions (Signed)
ICD-10-CM   1. Chronic obstructive pulmonary disease, unspecified COPD type ([place])  J44.9 Ambulatory Referral for DME  2. Chronic respiratory failure with hypoxia (H[facility])  J96.11 Ambulatory Referral for DME  3. History of COPD  Z87.09   4. History of anemia  Z86.2   5. Chronic kidney disease, unspecified CKD stage  N18.9     Stable symptoms with out of proportion symptoms You do qualify for oxygen based on testing today  Plan -Recertify for oxygen -Continue Trelegy schedule with albuterol as needed -Do work-up to understand out of proportion dyspnea  -Do arterial blood gas  -Do CBC and chemistry  -Do spirometry and DLCO [no need for lung volume of postbronchodilator]  -Do high-resolution CT chest supine and prone  Follow-up -Return to see Dr. Chase Caller the next few to several weeks or nurse practitioner but after completing the above  -Based on the results you might need to undergo pulmonary rehabilitation or start nocturnal BiPAP

## 2020-05-23 NOTE — Progress Notes (Signed)
Subjective:     Patient ID: Patricia [name], female   DOB: 1936/01/17, 84 y.o.   MRN: [id number]  HPI OV dec 2017 This is the case of Patricia [name], 84 y.o. Female, who Is being seen today for shortness of breath.  As you very well know, patient  is a 84 year old obese white female with history of aortic stenosis, hypertension, atrial fibrillation, coronary artery disease, chronic diastolic congestive heart failure, acute on chronic hypoxic respiratory failure, hyperlipidemia, and GI bleeding with anemia.  She recently had a TAVR a week ago.   Pt had a 19 PY smoking history, quit when she was 84 yrs old. Pt was diagnosed with adult asthma. Triggers: strong odors, pollen, change in weather. Has been on advair 100/50 1P BID for a while. Also on alb as needed.   She was started on O2 2L 24/7 during Thanksgiving admission for SOB.   Pt's SOB is some better after having TAVR. She continues to have episodic SOB.   Pt has snoring, occasional gasping or choking. Has witnessed apneas.  Has hypersomnia, mild. Naps daily.  Hypersomnia affects her fxnality.   ......................................................................Marland Kitchen  ROV 01/26/17 Patient returns to the office as follow-up on her dyspnea. Since last seen, she is overall improved. She barely uses her oxygen now during the daytime. She uses oxygen at bedtime. She checks her O2 saturation and it usually runs in the mid 90s on room air with exertion. Her asthma has also been stable. She is finishing her Advair and will switch to [employer].  because of insurance coverage.   OV 10/15/2017  Chief Complaint  Patient presents with  . Follow-up    Former [facility] pt.  CT scan done 12/13 and is here to go over the results.   Pt states things have been bad since last visit. Pt has had numerous flare-ups with her asthma. Has complaints of SOB and chest discomfort.    84 year old obese female who is to be followed by Dr. Murlean Iba for dyspnea that was  believed to be due to obesity, diastolic dysfunction aortic valve stenosis and asthma versus emphysema.  She also has a 5 mm left upper lobe nodule.  There is a history of hypersomnia  Patient presents for transfer of care after Dr. DDS left the practice.  A year ago she had T AVR procedure for aortic valve stenosis.  After that her dyspnea improved.  However now for the last 1 month her dyspnea is worse but there is no increased cough.  She has an occasional wheezing.  She did see cardiology recently and her BNP is high and increase diuresis has been called in.  She had an echocardiogram I reviewed the chart.  It shows diastolic dysfunction.  She continues to be on Brio for her reported diagnosis of asthma although she is unable to do exam nitric oxide today with [place].  She did have a CT scan of the chest this month 2018 that I personally visualized.  The 5 mm left upper lobe nodule has resolved.  There is no evidence of ILD although this is non-high-resolution CT chest.  She does have some emphysema.  She continues to be on Brio which states works well for her.  I noted that she is on ACE inhibitor    OV 11/19/2017  Chief Complaint  Patient presents with  . Acute Visit    Pt stated breathing has become worse x3 weeks. Pt was in the hosp. 11/12/17 due  to afib. Also has some complaints of mild cough.     84 year old obese female who has multifactorial dyspnea due to obesity, diastolic dysfunction, atrial fibrillation, aortic stenosis and emphysema versus asthma for which she is on Brio.  She had a 5 mm nodule left upper lobe that was resolved  Last visit December 2018.  Since then she is telling me that she has had 3 weeks of worsening shortness of breath.  This is an acute visit.  On November 13, 2017 she got admitted for self palpation of tachycardia.  She was noticed to be in atrial fibrillation with rapid ventricular rhythm.  She says she had electrical cardioversion and discharge.  After the dyspnea  improved a little bit but not much.  There is significant worsening and associated wheezing.  She is using albuterol on a regular basis.  There is no worsening edema orthopnea proximal nocturnal dyspnea or fever or phlegm production.  She did see cardiology yesterday and reported that her asthma was getting worse.  She is compliant with her Brio.  Walking desaturation test today in the office: RA x 3 laps: did only  1 lap and stopped due to knee pain.No tachy. No desats  FeNO 11/19/2017 - 24ppb  Results for [name], [name] (MRN [id number]) as of 10/15/2017 12:07  Ref. Range 09/16/2016 14:00  FEV1-Pre Latest Units: L 1.36  FEV1-%Pred-Pre Latest Units: % 74  Pre FEV1/FVC ratio Latest Units: % 71  Results for [name], OW (MRN [id number]) as of 10/15/2017 12:07  Ref. Range 09/16/2016 14:00  TLC Latest Units: L 3.59  TLC % pred Latest Units: % 75  Results for [name], [name] (MRN [id number]) as of 10/15/2017 12:07  Ref. Range 09/16/2016 14:00  DLCO unc Latest Units: ml/min/mmHg 12.73  DLCO unc % pred Latest Units: % 59  IMPRESSION: 1. 5 mm nodule seen previously in the lingula has resolved in the interval. 2.  Emphysema. (ICD10-J43.9) 3.  Aortic Atherosclerois (ICD10-170.0)   Electronically Signed   By: Patricia [name] M.D.   On: 10/08/2017 15:55   OV 02/22/2018  Chief Complaint  Patient presents with  . Follow-up    Hospital 3/27-4/2 due to a GI bleed.  Pt states she has had good and bad days with her breathing. States she has had some wheezing, especially after an injection of iron last Friday. Pt still can become SOB at anytime.    84 year old obese female who has multifactorial dyspnea due to obesity, diastolic dysfunction, atrial fibrillation, aortic stenosis and emphysema versus asthma for which she is on Brio.  She had a 5 mm nodule left upper lobe that was resolved   84 year old morbidly obese female with multifactorial dyspnea as stated above.  She is on Breo.  Last seen  in January 2019 for acute bronchitis.  She comes for routine follow-up.  Approximately 1 month ago she got admitted for occult GI bleed.  Her hemoglobin was 4 g% according to her history.  It did improve to 8 g% and above for the time of discharge which I personally reviewed the record and confirmed and visualize the lab.  She tells me that the reason for admission was severe fatigue.  Since then the fatigue is improved but she still has residual baseline fatigue and shortness of breath with exertion.  She has very limited mobility because of obesity and DJD and other medical problems.  Overall she is stable but the level of fatigue and shortness of breath is pretty significant class  III.  She says when she is flat and stable and at rest she has no problems but when she moves a little she gets dyspneic.  There is some wheezing as well but no cough or phlegm or fever. On Iron replacemen with iron infusion and not better. Fatigue is moderate   Walking desat test - 185 feet x 3 laps on RA - stopped at 1 lap due to dyspnea and back issues. Walked slow with came - HR 6 9 -> 84, Pulse ox 98% -> 93%   Previous [facility] pulmonary encounters: 07/07/2019 Patient contacted today for acute televisit. Report increased shortness of breath, wheezing x 5 days. No associated cough or congestion. Using Breo 200 as prescribed, using albuterol nebulizer 4x day with not much improvement. She does not weigh herself daily but reports it styas within 5 lbs. She does have feet swelling. She takes lasix 24m daily as prescribed. Reports that when she gets short of breath her heart rate goes up. Denies sick contact or covid exposure. No chest pain, cough.   03/02/2020 Patient presents today for follow-up visit for shortness of breath. She called our office on 4/23 with reports of increased shortness of breath, chest tightness x 1 week with associated nasal congestion/PND. Sent in prednisone taper. She is on 2L continuous at home and 4L  pulsed. She is compliant with BREO 200 once daily. Her breathing has varied over the last year. States that she has good days and bad days. She did improve with oral steriod but symptoms returned soon after after stopped. Reports post nasal drip with oxygen use. She takes zyrtec daily.  Spirometry: 08/06/2018- FVC 1.3 (51%), FEV1 0.9 (47%),  OV 05/23/2020  Subjective:  Patient ID: Patricia [name] female , DOB: 108-26-1937, age 89y.o. , MRN: [id number], [place] [id number] 84year old obese female who has multifactorial dyspnea due to obesity, diastolic dysfunction, atrial fibrillation, aortic stenosis and emphysema versus asthma for which she is on Brio.  She had a 5 mm nodule left upper lobe that was resolved   05/23/2020 -   Chief Complaint  Patient presents with  . Follow-up    Pt states that she feels like her breathing has become worse since last visit        Patricia Cocker89y.o. -she is here for follow-up.  Last seen by myself in April 2019.  After that because of the COVID-19 pandemic she was seeing nurse practitioner.  She tells me now she has been on oxygen for over a year.  She needs to requalify for oxygen therefore she is here.  Today on room air at rest she is 85%.  She corrected to 93% on 4 L nasal cannula pulse.  Therefore she qualifies for oxygen.  She is now on Trelegy.  She says it helps her but not fully.  She complains of significant amount of out of proportion dyspnea that is present even with minimal exertion and relieved by rest.  She is frustrated by this.  Earlier this year she had echocardiogram that showed improved ejection fraction.  The results are below.  Review of the results also indicate that she is got chronic hypercapnia but she is not on BiPAP.  She has chronic anemia.  She looks like she is getting iron infusions.  She has chronic kidney disease.  Currently she does not feel she has an exacerbation.  She does have some amount  of chronic cough but  dyspnea is a significant burden.  And it is severe.  Her last pulmonary function test was in 2017.  She had a CT scan angiogram of the abdomen.  The lung cuts ability to be clear.    PFT Results Latest Ref Rng & Units 09/16/2016  FVC-Pre L 1.92  FVC-Predicted Pre % 78  FVC-Post L 2.17  FVC-Predicted Post % 88  Pre FEV1/FVC % % 71  Post FEV1/FCV % % 70  FEV1-Pre L 1.36  FEV1-Predicted Pre % 74  FEV1-Post L 1.53  DLCO UNC% % 59  DLCO COR %Predicted % 74  TLC L 3.59  TLC % Predicted % 75  RV % Predicted % 73   Echocardiogram January 2021 - IMPRESSIONS    1. Left ventricular ejection fraction, by visual estimation, is 40 to  45%. The left ventricle has mildly decreased function. There is moderately  increased left ventricular hypertrophy.  2. Abnormal septal motion consistent with left bundle branch block.  3. Left ventricular diastolic function could not be evaluated.  4. The left ventricle demonstrates global hypokinesis.  5. Global right ventricle has normal systolic function.The right  ventricular size is normal. No increase in right ventricular wall  thickness.  6. 23 mm Edwards S3 TAVR. V max 3.5 m/s, MG 26 mmHG, DI 0.36, EOA 1.01  cm2. Gradients are increased but stable compared with 10/14/2017. No  paravalvular leak.  7. Left atrial size was severely dilated.  8. Moderate to severe mitral annular calcification.  9. The mitral valve is degenerative. Moderate mitral valve regurgitation.  Mild mitral stenosis.  10. The tricuspid valve is grossly normal.  11. Aortic valve regurgitation is not visualized.  12. The pulmonic valve was grossly normal. Pulmonic valve regurgitation is  trivial.  13. Moderately elevated pulmonary artery systolic pressure.  14. The inferior vena cava is dilated in size with <50% respiratory  variability, suggesting right atrial pressure of 15 mmHg.  15. The tricuspid regurgitant velocity is 3.59 m/s, and with an  assumed  right atrial pressure of 15 mmHg, the estimated right ventricular systolic  pressure is moderately elevated at 66.6 mmHg.   In comparison to the previous echocardiogram(s): EF has improved to 40-45%  compared with 25-30% noted on 07/27/2019. TAVR gradients remain stable but  elevated when compared to 10/14/2019. TAVR gradients were lower on  07/27/2019 due to severely reduced EF.  Mild calcific mitral stenosis remains.   ROS - per HPI  Results for Patricia [name], Patricia [name] (MRN [id number]) as of 05/23/2020 11:50  Ref. Range 02/03/2020 09:06  Hemoglobin Latest Ref Range: 11.7 - 15.5 g/dL 10.3 (L)   Results for Patricia [name], Patricia [name] (MRN [id number]) as of 05/23/2020 11:50  Ref. Range 10/14/2016 12:47 10/14/2016 13:58  pH, Arterial Latest Ref Range: 7.35 - 7.45  7.365 7.385  pCO2 arterial Latest Ref Range: 32 - 48 mmHg 55.0 (H) 51.9 (H)  pO2, Arterial Latest Ref Range: 83 - 108 mmHg 89.0 79.0 (L)    Results for Patricia [name], Patricia [name] (MRN [id number]) as of 05/23/2020 11:50  Ref. Range 02/03/2020 09:06  Creatinine Latest Ref Range: 0.60 - 0.88 mg/dL 1.87 (H)     has a past medical history of Allergy, Anemia, unspecified, Arthritis, Asthma, Atrial fibrillation status post cardioversion [facility]) (09/2015), Benign neoplasm of colon, Carpal tunnel syndrome, Cellulitis and abscess of finger, unspecified, Cerumen impaction, Cervicalgia, CHF (congestive heart failure) ([place]), Chronic airway obstruction, not elsewhere classified, Chronic anticoagulation (09/2015), Diarrhea, Diffuse cystic mastopathy, Edema, Encounter for long-term (current) use of  other medications, GERD (gastroesophageal reflux disease), Heart murmur, Lumbago, Neck pain (05/23/2015), Obesity, unspecified, Other and unspecified hyperlipidemia, Other psoriasis, Pain in joint, lower leg, PONV (postoperative nausea and vomiting), Primary localized osteoarthrosis of right shoulder (12/18/2015), S/P TAVR (transcatheter aortic valve replacement) (10/14/2016), Scoliosis  (and kyphoscoliosis), idiopathic, Shortness of breath dyspnea, Type II or unspecified type diabetes mellitus without mention of complication, uncontrolled, Unspecified essential hypertension, Unspecified hemorrhoids without mention of complication, Unspecified hypothyroidism, and Urge incontinence.   reports that she quit smoking about 30 years ago. She has a 40.00 pack-year smoking history. She has never used smokeless tobacco.  Past Surgical History:  Procedure Laterality Date  . ABDOMINAL HYSTERECTOMY  1987   complete  . BREAST BIOPSY Right   . BREAST EXCISIONAL BIOPSY Left 2011  . BREAST EXCISIONAL BIOPSY Right    x2  . BREAST SURGERY     right breast 3 surgeries [phone]  . CARDIAC CATHETERIZATION N/A 09/02/2016   Procedure: Right/Left Heart Cath and Coronary Angiography;  Surgeon: [name] M Martinique, MD;  Location: [place] [facility];  Service: Cardiovascular;  Laterality: N/A;  . CARDIOVERSION N/A 10/19/2015   Procedure: CARDIOVERSION;  Surgeon: Lelon Perla, MD;  Location: [facility] ENDOSCOPY;  Service: Cardiovascular;  Laterality: N/A;  . CARDIOVERSION N/A 11/13/2017   Procedure: CARDIOVERSION;  Surgeon: Jerline Pain, MD;  Location[facility] ENDOSCOPY;  Service: Cardiovascular;  Laterality: N/A;  . CARDIOVERSION N/A 07/28/2019   Procedure: CARDIOVERSION;  Surgeon: Buford Dresser, MD;  Location: [place];  Service: Cardiovascular;  Laterality: N/A;  . [place]  . COLON SURGERY     2004,2007,2013.  3 times colon surgieres  . ESOPHAGOGASTRODUODENOSCOPY (EGD) WITH PROPOFOL Left 03/13/2017   Procedure: ESOPHAGOGASTRODUODENOSCOPY (EGD) WITH PROPOFOL;  Surgeon: Ronnette Juniper, MD;  Location: [place];  Service: Gastroenterology;  Laterality: Left;  . ESOPHAGOGASTRODUODENOSCOPY (EGD) WITH PROPOFOL Left 01/22/2018   Procedure: ESOPHAGOGASTRODUODENOSCOPY (EGD) WITH PROPOFOL;  Surgeon: Arta Silence, MD;  Location: [facility] ENDOSCOPY;  Service: Endoscopy;   Laterality: Left;  . EXCISE LE MANDIBULAR LYMPH NODE T     DR C. NEWMAN  . FOOT SURGERY  1989  . GIVENS CAPSULE STUDY Left 01/23/2018   Procedure: GIVENS CAPSULE STUDY;  Surgeon: Wilford Corner, MD;  Location: [facility] ENDOSCOPY;  Service: Endoscopy;  Laterality: Left;  . LEFT SHOULDER ARTHROSCOPY    . 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 NEUROPLASTY / TRANSPOSITION MEDIAN NERVE AT CARPAL TUNNEL BILATERAL  05/2003  . TEE WITHOUT CARDIOVERSION N/A 10/19/2015   Procedure: Transesophageal Echocardiogram (TEE) ;  Surgeon: Lelon Perla, MD;  Location: [facility] ENDOSCOPY;  Service: Cardiovascular;  Laterality: N/A;  . TEE WITHOUT CARDIOVERSION N/A 10/14/2016   Procedure: TRANSESOPHAGEAL ECHOCARDIOGRAM (TEE);  Surgeon: Sherren Mocha, MD;  Location: [place];  Service: Open Heart Surgery;  Laterality: N/A;  . TEE WITHOUT CARDIOVERSION N/A 07/28/2019   Procedure: TRANSESOPHAGEAL ECHOCARDIOGRAM (TEE);  Surgeon: Buford Dresser, MD;  Location: [facility] ENDOSCOPY;  Service: Cardiovascular;  Laterality: N/A;  . TOTAL SHOULDER ARTHROPLASTY Right 12/18/2015   Procedure: TOTAL SHOULDER ARTHROPLASTY;  Surgeon: [name], MD;  Location: [place];  Service: Orthopedics;  Laterality: Right;  . TRANSCATHETER AORTIC VALVE REPLACEMENT, TRANSFEMORAL N/A 10/14/2016   Procedure: TRANSCATHETER AORTIC VALVE REPLACEMENT, TRANSFEMORAL;  Surgeon: Sherren Mocha, MD;  Location: [place];  Service: Open Heart Surgery;  Laterality: N/A;  . TRIGGER FINGER RELEASE Left   . VAGINAL CYST REMOVED  1967    Allergies  Allergen Reactions  . Codeine Other (See Comments)    "  Spaces out" the patient and she cannot walk well  . Vesicare [Solifenacin] Itching    Immunization History  Administered Date(s) Administered  . DTaP 08/03/2003  . Fluad Quad(high Dose 65+) 07/29/2019  . Influenza, High Dose Seasonal PF 06/15/2017, 09/02/2018  . Influenza,inj,Quad PF,6+ Mos 09/29/2013, 07/19/2014, 09/26/2015, 09/13/2016  . Influenza-Unspecified  11/04/2012  . PFIZER SARS-COV-2 Vaccination 01/01/2020, 01/31/2020  . Pneumococcal Conjugate-13 09/26/2015  . Pneumococcal Polysaccharide-23 12/01/1993, 08/19/2017    Family History  Problem Relation Age of Onset  . Diabetes Father   . Stroke Father   . Heart disease Father   . Liver cancer Sister   . Heart disease Brother   . Asthma Sister   . Arthritis Sister   . Breast cancer Neg Hx      Current Outpatient Medications:  .  acetaminophen (TYLENOL) 650 MG CR tablet, Take 1,300 mg by mouth every 8 (eight) hours as needed for pain., Disp: , Rfl:  .  albuterol (PROVENTIL) (2.5 MG/3ML) 0.083% nebulizer solution, USE 1 VIAL IN NEBULIZER EVERY 4 HOURS AND AS NEEDED. Generic: VENTOLIN, Disp: 60 vial, Rfl: 10 .  albuterol (VENTOLIN HFA) 108 (90 Base) MCG/ACT inhaler, Inhale 2 puffs into the lungs every 6 (six) hours as needed for wheezing or shortness of breath., Disp: 6.7 g, Rfl: 3 .  amiodarone (PACERONE) 200 MG tablet, Take 1 tablet (200 mg total) by mouth daily., Disp: 30 tablet, Rfl: 2 .  azelastine (ASTELIN) 0.1 % nasal spray, Place 1 spray into both nostrils 2 (two) times daily. Use in each nostril as directed, Disp: 30 mL, Rfl: 1 .  Blood Glucose Monitoring Suppl (ONETOUCH VERIO) w/Device KIT, Check blood sugar twice daily DX:E11.9, Disp: 1 kit, Rfl: 0 .  cetirizine (ZYRTEC) 10 MG tablet, Take 10 mg by mouth daily as needed for allergies. , Disp: , Rfl:  .  cholecalciferol (VITAMIN D) 1000 units tablet, Take 1,000 Units by mouth at bedtime. , Disp: , Rfl:  .  Dextromethorphan-guaiFENesin (MUCINEX DM) 30-600 MG TB12, Take 2 tablets by mouth as needed., Disp: , Rfl:  .  doxazosin (CARDURA) 4 MG tablet, TAKE 1 TABLET BY MOUTH EVERY DAY TO HELP CONTROL BLOOD PRESSURE, Disp: 90 tablet, Rfl: 1 .  ferrous sulfate 325 (65 FE) MG EC tablet, Take 325 mg by mouth 2 (two) times daily., Disp: , Rfl:  .  Fluticasone-Umeclidin-Vilant (TRELEGY ELLIPTA) 200-62.5-25 MCG/INH AEPB, Inhale 1 puff into the  lungs daily., Disp: 28 each, Rfl: 5 .  furosemide (LASIX) 40 MG tablet, Take 2 tablets (80 mg total) by mouth every morning. Take an extra 40 mg tablet every Monday, Wednesday, and Friday at lunch time with one extra potassium tablet., Disp: 90 tablet, Rfl: 3 .  glimepiride (AMARYL) 1 MG tablet, TAKE 1 TABLET(1 MG) BY MOUTH DAILY WITH BREAKFAST, Disp: 90 tablet, Rfl: 1 .  glucose blood test strip, 1 each by Other route 2 (two) times daily. OneTouch Verio DX E11.8, Disp: 200 each, Rfl: 11 .  JANUVIA 50 MG tablet, TAKE 1 TABLET(50 MG) BY MOUTH DAILY, Disp: 30 tablet, Rfl: 6 .  Lancets (ONETOUCH ULTRASOFT) lancets, 1 each by Other route 2 (two) times daily. OneTouch Verio DX E11.9, Disp: 200 each, Rfl: 11 .  levothyroxine (SYNTHROID) 88 MCG tablet, TAKE 1 TABLET BY MOUTH DAILY ON AN EMPTY STOMACH, Disp: 90 tablet, Rfl: 1 .  OXYGEN, Inhale 2 L into the lungs continuous. Pt also uses 4L pulse via POC, Disp: , Rfl:  .  pantoprazole (PROTONIX) 40  MG tablet, TAKE 1 TABLET(40 MG) BY MOUTH TWICE DAILY, Disp: 180 tablet, Rfl: 1 .  potassium gluconate 595 (99 K) MG TABS tablet, Take 595 mg by mouth 2 (two) times daily. Take one extra tablet on Monday, Wednesday, and Friday with extra 40 mg Lasix dose., Disp: , Rfl:  .  predniSONE (DELTASONE) 10 MG tablet, Take 4 tabs po daily x 3 days; then 3 tabs daily x3 days; then 2 tabs daily x3 days; then 1 tab daily x 3 days; then stop, Disp: 30 tablet, Rfl: 0 .  rosuvastatin (CRESTOR) 10 MG tablet, TAKE 1 TABLET(10 MG) BY MOUTH DAILY, Disp: 90 tablet, Rfl: 1      Objective:   Vitals:   05/23/20 1147  BP: (!) 120/64  Pulse: 64  SpO2: 93%  Weight: (!) 98.8 kg  Height: _0  (1.6 m)    Estimated body mass index is 38.58 kg/m as calculated from the following:   Height as of this encounter: _1  (1.6 m).   Weight as of this encounter: 98.8 kg.  _2 @  Filed Weights   05/23/20 1147  Weight: (!) 98.8 kg     Physical Exam  General Appearance:     Alert, cooperative, no distress, appears stated age - yes , Deconditioned looking - yes , OBESE  - yes, Sitting on Wheelchair -  No but has walker  Head:    Normocephalic, without obvious abnormality, atraumatic  Eyes:    PERRL, conjunctiva/corneas clear,  Ears:    Normal TM's and external ear canals, both ears  Nose:   Nares normal, septum midline, mucosa normal, no drainage    or sinus tenderness. OXYGEN ON  - yes . Patient is @ 4L [place] pulsed   Throat:   Lips, mucosa, and tongue normal; teeth and gums normal. Cyanosis on lips - no  Neck:   Supple, symmetrical, trachea midline, no adenopathy;    thyroid:  no enlargement/tenderness/nodules; no carotid   bruit or JVD  Back:     Symmetric, no curvature, ROM normal, no CVA tenderness  Lungs:     Distress - no , Wheeze no, Barrell Chest - no, Purse lip breathing - no, Crackles - mild at base +   Chest Wall:    No tenderness or deformity.    Heart:    Regular rate and rhythm, S1 and S2 normal, no rub   or gallop, Murmur - no  Breast Exam:    NOT DONE  Abdomen:     Soft, non-tender, bowel sounds active all four quadrants,    no masses, no organomegaly. Visceral obesity - yes  Genitalia:   NOT DONE  Rectal:   NOT DONE  Extremities:   Extremities - normal, Has Cane - no, Clubbing - yes mild, Edema - no  Pulses:   2+ and symmetric all extremities  Skin:   Stigmata of Connective Tissue Disease - no  Lymph nodes:   Cervical, supraclavicular, and axillary nodes normal  Psychiatric:  Neurologic:   Pleasant - yes, Anxious - no, Flat affect - no  CAm-ICU - neg, Alert and Oriented x 3 - yes, Moves all 4s - yes, Speech - normal, Cognition - intact           Assessment:       ICD-10-CM   1. Chronic obstructive pulmonary disease, unspecified COPD type ([place])  J44.9 Ambulatory Referral for DME    Pulmonary function test    Pulmonary function test    CBC  w/Diff    Basic Metabolic Panel (BMET)    CT Chest High Resolution  2. Chronic respiratory  failure with hypoxia (HCC)  J96.11 Ambulatory Referral for DME  3. History of COPD  Z87.09   4. History of anemia  Z86.2   5. Chronic kidney disease, unspecified CKD stage  N18.9   6. Shortness of breath  R06.02        Plan:     Patient Instructions     ICD-10-CM   1. Chronic obstructive pulmonary disease, unspecified COPD type ([place])  J44.9 Ambulatory Referral for DME  2. Chronic respiratory failure with hypoxia (HCC)  J96.11 Ambulatory Referral for DME  3. History of COPD  Z87.09   4. History of anemia  Z86.2   5. Chronic kidney disease, unspecified CKD stage  N18.9     Stable symptoms with out of proportion symptoms You do qualify for oxygen based on testing today  Plan -Recertify for oxygen -Continue Trelegy schedule with albuterol as needed -Do work-up to understand out of proportion dyspnea  -Do arterial blood gas  -Do CBC and chemistry  -Do spirometry and DLCO [no need for lung volume of postbronchodilator]  -Do high-resolution CT chest supine and prone  Follow-up -Return to see Dr. Chase Caller the next few to several weeks or nurse practitioner but after completing the above  -Based on the results you might need to undergo pulmonary rehabilitation or start nocturnal BiPAP     SIGNATURE    Dr. Brand Males, M.D., F.C.C.P,  Pulmonary and Critical Care Medicine Staff Physician, [facility] Director - Interstitial Lung Disease  Program  [facility] [place], [place], [address]  Pager: [phone], If no answer or between  15:00h - 7:00h: call [phone] Telephone: [phone]  12:08 PM 05/23/2020

## 2020-05-30 DIAGNOSIS — J449 Chronic obstructive pulmonary disease, unspecified: Secondary | ICD-10-CM | POA: Diagnosis not present

## 2020-05-31 ENCOUNTER — Ambulatory Visit (INDEPENDENT_AMBULATORY_CARE_PROVIDER_SITE_OTHER)
Admission: RE | Admit: 2020-05-31 | Discharge: 2020-05-31 | Disposition: A | Discharge status: Home / Self Care | Payer: Medicare Other | Source: Ambulatory Visit | Attending: Internal Medicine | Admitting: Internal Medicine

## 2020-05-31 ENCOUNTER — Other Ambulatory Visit: Payer: Self-pay

## 2020-05-31 DIAGNOSIS — J84112 Idiopathic pulmonary fibrosis: Secondary | ICD-10-CM | POA: Diagnosis not present

## 2020-05-31 DIAGNOSIS — I700 Atherosclerosis of aorta: Secondary | ICD-10-CM | POA: Diagnosis not present

## 2020-05-31 DIAGNOSIS — J449 Chronic obstructive pulmonary disease, unspecified: Secondary | ICD-10-CM

## 2020-05-31 DIAGNOSIS — J9811 Atelectasis: Secondary | ICD-10-CM | POA: Diagnosis not present

## 2020-05-31 DIAGNOSIS — J984 Other disorders of lung: Secondary | ICD-10-CM | POA: Diagnosis not present

## 2020-06-04 ENCOUNTER — Other Ambulatory Visit: Payer: Self-pay

## 2020-06-04 ENCOUNTER — Telehealth: Payer: Self-pay | Admitting: Internal Medicine

## 2020-06-04 MED ORDER — FUROSEMIDE 40 MG PO TABS: 80.0000 mg | ORAL_TABLET | ORAL | 3 refills | Status: DC

## 2020-06-04 NOTE — Telephone Encounter (Signed)
LMTCB

## 2020-06-04 NOTE — Telephone Encounter (Signed)
Spoke with the pt  She states calling someone back  She is unsure who or what it was regarding  I do not see any phone or result notes  Pt already aware of her PFT and upcoming ov  She is wondering if someone called about scheduling ABG b/c MR did mention this needing to be set up at last ov  PCC's- did you call this pt? Thanks!

## 2020-06-04 NOTE — Telephone Encounter (Signed)
I don't see where a Linneus tried to call.  There isn't an ABG order placed so we would not have called to schedule.

## 2020-06-05 NOTE — Telephone Encounter (Signed)
LMTCB x2 for pt 

## 2020-06-07 NOTE — Telephone Encounter (Signed)
Called and left a detailed message on pt's machine informing her we have not called her but her ABG is still needing to be scheduled. There is a current ABG order in pt's chart (within PFT order). Judeen Hammans, [facility], has called to get pt scheduled. Will keep encounter open to follow up on ABG.

## 2020-06-10 NOTE — Progress Notes (Signed)
Beth, you are aseeing this patient next few days. If they did not do supine and prone images please get that as followup for HRCT in 9-12 months. Pls eval for hx of aspiration etc.,  Also, looks like ABG not done yet. Thanks, MR

## 2020-06-11 ENCOUNTER — Other Ambulatory Visit (HOSPITAL_COMMUNITY): Payer: Self-pay | Admitting: Internal Medicine

## 2020-06-11 DIAGNOSIS — J449 Chronic obstructive pulmonary disease, unspecified: Secondary | ICD-10-CM

## 2020-06-11 DIAGNOSIS — R0602 Shortness of breath: Secondary | ICD-10-CM

## 2020-06-11 NOTE — Progress Notes (Deleted)
_0  ID: Patricia [name], female    DOB: [birth year], 84 y.o.   MRN: [id number]  No chief complaint on file.   Referring provider: [name], NP  HPI:   84 year old female, former smoker quit in [place] (40 pack year hx). PMH significant for COPD, obesity hypoventilation syndrome, CHF, Afib, HTN, aortic stenosis, s/p TAVR in 2017, GIB. Patient of Dr. Chase Caller. Maintained on Breo 200.  Previous [facility] pulmonary encounters: 07/07/2019 Patient contacted today for acute televisit. Report increased shortness of breath, wheezing x 5 days. No associated cough or congestion. Using Breo 200 as prescribed, using albuterol nebulizer 4x day with not much improvement. She does not weigh herself daily but reports it styas within 5 lbs. She does have feet swelling. She takes lasix 54m daily as prescribed. Reports that when she gets short of breath her heart rate goes up. Denies sick contact or covid exposure. No chest pain, cough.   03/02/2020 Patient presents today for follow-up visit for shortness of breath. She called our office on 4/23 with reports of increased shortness of breath, chest tightness x 1 week with associated nasal congestion/PND. Sent in prednisone taper. She is on 2L continuous at home and 4L pulsed. She is compliant with BREO 200 once daily. Her breathing has varied over the last year. States that she has good days and bad days. She did improve with oral steriod but symptoms returned soon after after stopped. Reports post nasal drip with oxygen use. She takes zyrtec daily.  06/12/2020 Patient presents today for follow-up.   See result note from MR  Spirometry: 08/06/2018- FVC 1.3 (51%), FEV1 0.9 (47%), ratio 69    Allergies  Allergen Reactions  . Codeine Other (See Comments)    "Spaces out" the patient and she cannot walk well  . Vesicare [Solifenacin] Itching    Immunization History  Administered Date(s) Administered  . DTaP 08/03/2003  . Fluad Quad(high Dose 65+)  07/29/2019  . Influenza, High Dose Seasonal PF 06/15/2017, 09/02/2018  . Influenza,inj,Quad PF,6+ Mos 09/29/2013, 07/19/2014, 09/26/2015, 09/13/2016  . Influenza-Unspecified 11/04/2012  . PFIZER SARS-COV-2 Vaccination 01/01/2020, 01/31/2020  . Pneumococcal Conjugate-13 09/26/2015  . Pneumococcal Polysaccharide-23 12/01/1993, 08/19/2017    Past Medical History:  Diagnosis Date  . Allergy   . Anemia, unspecified   . Arthritis   . Asthma   . Atrial fibrillation status post cardioversion ([facility] 09/2015   s/p TEE/DCCV>>SR on amio  . Benign neoplasm of colon   . Carpal tunnel syndrome   . Cellulitis and abscess of finger, unspecified   . Cerumen impaction   . Cervicalgia   . [facility] ([facility]estive heart failure) ([name]   . Chronic airway obstruction, not elsewhere classified   . Chronic anticoagulation 09/2015   Eliquis  . Diarrhea   . Diffuse cystic mastopathy   . Edema   . Encounter for long-term (current) use of other medications   . GERD (gastroesophageal reflux disease)   . Heart murmur   . Lumbago   . Neck pain 05/23/2015  . Obesity, unspecified   . Other and unspecified hyperlipidemia   . Other psoriasis   . Pain in joint, lower leg   . PONV (postoperative nausea and vomiting)   . Primary localized osteoarthrosis of right shoulder 12/18/2015  . 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 Scoliosis (and kyphoscoliosis), idiopathic   . Shortness of breath dyspnea  with exertion  . Type II or unspecified type diabetes mellitus without mention of complication, uncontrolled   . Unspecified essential hypertension   . Unspecified hemorrhoids without mention of complication   . Unspecified hypothyroidism   . Urge incontinence     Tobacco History: Social History   Tobacco Use  Smoking Status Former Smoker  . Packs/day: 1.00  . Years: 40.00  . Pack years:  40.00  . Quit date: 09/07/1989  . Years since quitting: 30.7  Smokeless Tobacco Never Used   Counseling given: Not Answered   Outpatient Medications Prior to Visit  Medication Sig Dispense Refill  . acetaminophen (TYLENOL) 650 MG CR tablet Take 1,300 mg by mouth every 8 (eight) hours as needed for pain.    Marland Kitchen albuterol (PROVENTIL) (2.5 MG/3ML) 0.083% nebulizer solution USE 1 VIAL IN NEBULIZER EVERY 4 HOURS AND AS NEEDED. Generic: VENTOLIN 60 vial 10  . albuterol (VENTOLIN HFA) 108 (90 Base) MCG/ACT inhaler Inhale 2 puffs into the lungs every 6 (six) hours as needed for wheezing or shortness of breath. 6.7 g 3  . amiodarone (PACERONE) 200 MG tablet Take 1 tablet (200 mg total) by mouth daily. 30 tablet 2  . azelastine (ASTELIN) 0.1 % nasal spray Place 1 spray into both nostrils 2 (two) times daily. Use in each nostril as directed 30 mL 1  . Blood Glucose Monitoring Suppl (ONETOUCH VERIO) w/Device KIT Check blood sugar twice daily DX:E11.9 1 kit 0  . cetirizine (ZYRTEC) 10 MG tablet Take 10 mg by mouth daily as needed for allergies.     . cholecalciferol (VITAMIN D) 1000 units tablet Take 1,000 Units by mouth at bedtime.     Marland Kitchen Dextromethorphan-guaiFENesin (MUCINEX DM) 30-600 MG TB12 Take 2 tablets by mouth as needed.    . doxazosin (CARDURA) 4 MG tablet TAKE 1 TABLET BY MOUTH EVERY DAY TO HELP CONTROL BLOOD PRESSURE 90 tablet 1  . ferrous sulfate 325 (65 FE) MG EC tablet Take 325 mg by mouth 2 (two) times daily.    . Fluticasone-Umeclidin-Vilant (TRELEGY ELLIPTA) 200-62.5-25 MCG/INH AEPB Inhale 1 puff into the lungs daily. 28 each 5  . furosemide (LASIX) 40 MG tablet Take 2 tablets (80 mg total) by mouth every morning. Take an extra 40 mg tablet every Monday, Wednesday, and Friday at lunch time with one extra potassium tablet. 90 tablet 3  . glimepiride (AMARYL) 1 MG tablet TAKE 1 TABLET(1 MG) BY MOUTH DAILY WITH BREAKFAST 90 tablet 1  . glucose blood test strip 1 each by Other route 2 (two)  times daily. OneTouch Verio DX E11.8 200 each 11  . JANUVIA 50 MG tablet TAKE 1 TABLET(50 MG) BY MOUTH DAILY 30 tablet 6  . Lancets (ONETOUCH ULTRASOFT) lancets 1 each by Other route 2 (two) times daily. OneTouch Verio DX E11.9 200 each 11  . levothyroxine (SYNTHROID) 88 MCG tablet TAKE 1 TABLET BY MOUTH DAILY ON AN EMPTY STOMACH 90 tablet 1  . OXYGEN Inhale 2 L into the lungs continuous. Pt also uses 4L pulse via POC    . pantoprazole (PROTONIX) 40 MG tablet TAKE 1 TABLET(40 MG) BY MOUTH TWICE DAILY 180 tablet 1  . potassium gluconate 595 (99 K) MG TABS tablet Take 595 mg by mouth 2 (two) times daily. Take one extra tablet on Monday, Wednesday, and Friday with extra 40 mg Lasix dose.    . predniSONE (DELTASONE) 10 MG tablet Take 4 tabs po daily x 3 days; then 3 tabs daily x3 days; then  2 tabs daily x3 days; then 1 tab daily x 3 days; then stop 30 tablet 0  . rosuvastatin (CRESTOR) 10 MG tablet TAKE 1 TABLET(10 MG) BY MOUTH DAILY 90 tablet 1   No facility-administered medications prior to visit.      Review of Systems  Review of Systems   Physical Exam  There were no vitals taken for this visit. Physical Exam   Lab Results:  CBC    Component Value Date/Time   WBC 5.3 05/23/2020 1211   RBC 3.44 (L) 05/23/2020 1211   HGB 10.5 (L) 05/23/2020 1211   HGB 10.3 (L) 08/09/2019 1044   HCT 32.1 (L) 05/23/2020 1211   HCT 32.1 (L) 08/09/2019 1044   PLT 136.0 (L) 05/23/2020 1211   PLT 194 08/09/2019 1044   MCV 93.2 05/23/2020 1211   MCV 87 08/09/2019 1044   MCH 27.2 02/03/2020 0906   MCHC 32.6 05/23/2020 1211   RDW 17.0 (H) 05/23/2020 1211   RDW 15.1 08/09/2019 1044   LYMPHSABS 0.6 (L) 05/23/2020 1211   LYMPHSABS 2.0 04/28/2016 0940   MONOABS 0.5 05/23/2020 1211   EOSABS 0.1 05/23/2020 1211   EOSABS 0.2 04/28/2016 0940   BASOSABS 0.0 05/23/2020 1211   BASOSABS 0.0 04/28/2016 0940    BMET    Component Value Date/Time   NA 139 05/23/2020 1211   NA 144 08/09/2019 1044   K  4.2 05/23/2020 1211   CL 97 05/23/2020 1211   CO2 39 (H) 05/23/2020 1211   GLUCOSE 211 (H) 05/23/2020 1211   BUN 40 (H) 05/23/2020 1211   BUN 38 (H) 08/09/2019 1044   CREATININE 1.63 (H) 05/23/2020 1211   CREATININE 1.87 (H) 02/03/2020 0906   CALCIUM 9.6 05/23/2020 1211   GFRNONAA 25 (L) 02/03/2020 0906   GFRAA 29 (L) 02/03/2020 0906    BNP    Component Value Date/Time   BNP 943.8 (H) 07/25/2019 1805    ProBNP    Component Value Date/Time   PROBNP 835.0 (H) 07/15/2019 1222    Imaging: CT Chest High Resolution  Result Date: 06/01/2020 CLINICAL DATA:  Increased shortness of breath, history of COPD, oxygen dependent EXAM: CT CHEST WITHOUT CONTRAST TECHNIQUE: Multidetector CT imaging of the chest was performed following the standard protocol without intravenous contrast. High resolution imaging of the lungs, as well as inspiratory and expiratory imaging, was performed. COMPARISON:  10/08/2017 FINDINGS: Cardiovascular: Aortic atherosclerosis. Aortic valve stent endograft. Cardiomegaly. Coronary artery calcifications. No pericardial effusion. Mediastinum/Nodes: No enlarged mediastinal, hilar, or axillary lymph nodes. Thyroid gland, trachea, and esophagus demonstrate no significant findings. Lungs/Pleura: Mild centrilobular emphysema. Bibasilar scarring and atelectasis, worse in the right lung base and increased on the right when compared to prior examination dated 10/08/2017. There is some non dependent irregular peripheral interstitial opacity and septal thickening, particularly in the peripheral left lower lobe and lingula. No significant air trapping on expiratory phase imaging. No pleural effusion or pneumothorax. Upper Abdomen: No acute abnormality. Musculoskeletal: No chest wall mass or suspicious bone lesions identified. IMPRESSION: 1. Bibasilar scarring and atelectasis, worse in the right lung base and increased on the right when compared to prior examination dated 10/08/2017. There is  some non dependent irregular peripheral interstitial opacity and septal thickening, particularly in the peripheral left lower lobe and lingula. Overall findings favor sequelae of prior infection or inflammation, particularly aspiration, however an underlying component of mild fibrotic interstitial lung disease is difficult to exclude. If strictly characterized by ATS pulmonary fibrosis criteria, findings are in an  early "indeterminate for UIP" pattern. Consider follow-up ILD protocol CT if there is high persistent clinical concern for fibrotic interstitial lung disease. Findings are indeterminate for UIP per consensus guidelines: Diagnosis of Idiopathic Pulmonary Fibrosis: An Official ATS/ERS/JRS/ALAT Clinical Practice Guideline. [place], Iss 5, [phone], Jun 27 2017. 2. Mild underlying emphysema. Emphysema (ICD10-J43.9). 3. Cardiomegaly and coronary artery disease. 4. Aortic valve stent endograft. Aortic Atherosclerosis (ICD10-I70.0). Electronically Signed   By: Eddie Candle M.D.   On: 06/01/2020 13:04     Assessment & Plan:   No problem-specific Assessment & Plan notes found for this encounter.     Martyn Ehrich, NP 06/11/2020

## 2020-06-12 ENCOUNTER — Other Ambulatory Visit: Payer: Self-pay

## 2020-06-12 ENCOUNTER — Ambulatory Visit (HOSPITAL_COMMUNITY)
Admission: RE | Admit: 2020-06-12 | Discharge: 2020-06-12 | Disposition: A | Discharge status: Home / Self Care | Payer: Medicare Other | Source: Ambulatory Visit | Attending: Internal Medicine | Admitting: Internal Medicine

## 2020-06-12 ENCOUNTER — Ambulatory Visit: Payer: Medicare Other | Admitting: Primary Care

## 2020-06-12 DIAGNOSIS — J449 Chronic obstructive pulmonary disease, unspecified: Secondary | ICD-10-CM | POA: Diagnosis not present

## 2020-06-12 LAB — BLOOD GAS, ARTERIAL
Acid-Base Excess: 7.1 mmol/L — ABNORMAL HIGH (ref 0.0–2.0)
Bicarbonate: 32.3 mmol/L — ABNORMAL HIGH (ref 20.0–28.0)
Drawn by: 205171
FIO2: 28
O2 Saturation: 96.6 %
Patient temperature: 37
pCO2 arterial: 57.9 mmHg — ABNORMAL HIGH (ref 32.0–48.0)
pH, Arterial: 7.366 (ref 7.350–7.450)
pO2, Arterial: 84.9 mmHg (ref 83.0–108.0)

## 2020-06-12 NOTE — Telephone Encounter (Signed)
Pt is scheduled for ABG and PFT today 06/12/20. Nothing further needed at this time. Will sign off.

## 2020-06-14 ENCOUNTER — Encounter: Payer: Self-pay | Admitting: Primary Care

## 2020-06-14 ENCOUNTER — Other Ambulatory Visit: Payer: Self-pay

## 2020-06-14 ENCOUNTER — Ambulatory Visit: Payer: Medicare Other | Admitting: Primary Care

## 2020-06-14 VITALS — BP 144/60 | HR 66 | Temp 97.3°F | Ht 63.0 in | Wt 216.0 lb

## 2020-06-14 DIAGNOSIS — J9611 Chronic respiratory failure with hypoxia: Secondary | ICD-10-CM

## 2020-06-14 DIAGNOSIS — E662 Morbid (severe) obesity with alveolar hypoventilation: Secondary | ICD-10-CM | POA: Diagnosis not present

## 2020-06-14 DIAGNOSIS — J849 Interstitial pulmonary disease, unspecified: Secondary | ICD-10-CM | POA: Diagnosis not present

## 2020-06-14 DIAGNOSIS — J9612 Chronic respiratory failure with hypercapnia: Secondary | ICD-10-CM

## 2020-06-14 DIAGNOSIS — R9389 Abnormal findings on diagnostic imaging of other specified body structures: Secondary | ICD-10-CM

## 2020-06-14 DIAGNOSIS — J449 Chronic obstructive pulmonary disease, unspecified: Secondary | ICD-10-CM

## 2020-06-14 NOTE — Progress Notes (Signed)
_0  ID: Patricia [name], female    DOB: 05-Oct-1936, 84 y.o.   MRN: [id number]  Chief Complaint  Patient presents with  . Follow-up    C/o sob increased x 1 mth., no cough.    Referring provider: [name], NP  HPI: 84 year old female, former smoker quit in [place] (40 pack year hx). PMH significant for COPD, obesity hypoventilation syndrome, CHF, Afib, HTN, aortic stenosis, s/p TAVR in 2017, GIB. Patient of Dr. Chase Caller.  Breo 200 changed to Trelegy in May 2021.   Previous [facility] pulmonary encounters: 07/07/2019 Patient contacted today for acute televisit. Report increased shortness of breath, wheezing x 5 days. No associated cough or congestion. Using Breo 200 as prescribed, using albuterol nebulizer 4x day with not much improvement. She does not weigh herself daily but reports it styas within 5 lbs. She does have feet swelling. She takes lasix 78m daily as prescribed. Reports that when she gets short of breath her heart rate goes up. Denies sick contact or covid exposure. No chest pain, cough.   03/02/2020 Patient presents today for follow-up visit for shortness of breath. She called our office on 4/23 with reports of increased shortness of breath, chest tightness x 1 week with associated nasal congestion/PND. Sent in prednisone taper. She is on 2L continuous at home and 4L pulsed. She is compliant with BREO 200 once daily. Her breathing has varied over the last year. States that she has good days and bad days. She did improve with oral steriod but symptoms returned soon after after stopped. Reports post nasal drip with oxygen use. She takes zyrtec daily.  06/14/2020 Patient presents today for follow-up. She was scheduled for PFTs yesterday, she tells me that she checked in at the front desk but waited for an hour without having testing done. She ended up going home as she was very frustrated. She will need to be re-scheduled for PFTs. She had HRCT on 05/31/20 that showed increased right  bibasilar scarring/atelevtasis. There was some non-dependent irregular peripheral interstitial opacity and septal thickening. Findings favor sequelae or prior infection.inflammation, partucularly aspiration. Discussed results with patient, she reports that she has aspirated in the past but not recently. Breathing is unchanged, she continued to experience basline dyspnea on exertion. She remains on 2L continuous oxygen at home and 4L POC. No acute complaints today.   Spirometry: 08/06/2018- FVC 1.3 (51%), FEV1 0.9 (47%), ratio 69     LV end diastolic pressure is moderately elevated.  There is severe aortic valve stenosis.  There is no mitral valve stenosis.  Prox LAD to Mid LAD lesion, 20 %stenosed.  Ost 3rd Mrg to 3rd Mrg lesion, 75 %stenosed.  Ost 2nd Mrg to 2nd Mrg lesion, 30 %stenosed.  Ost RCA to Dist RCA lesion, 10 %stenosed.  Hemodynamic findings consistent with mild pulmonary hypertension.   1. Single vessel obstructive CAD involving OM3 2. Moderate to severe aortic stenosis. Mean gradient 41 mm Hg, valve index 0.7 3. Mild pulmonary HTN 4. Elevated LV filling pressures. Prominent V waves on PCWP tracings c/w decreased LV compliance.  5. Normal cardiac output.   Plan: recommend consideration for AVR/ single vessel CABG.   Allergies  Allergen Reactions  . Codeine Other (See Comments)    "Spaces out" the patient and she cannot walk well  . Vesicare [Solifenacin] Itching    Immunization History  Administered Date(s) Administered  . DTaP 08/03/2003  . Fluad Quad(high Dose 65+) 07/29/2019  . Influenza, High Dose Seasonal PF  06/15/2017, 09/02/2018  . Influenza,inj,Quad PF,6+ Mos 09/29/2013, 07/19/2014, 09/26/2015, 09/13/2016  . Influenza-Unspecified 11/04/2012  . PFIZER SARS-COV-2 Vaccination 01/01/2020, 01/31/2020  . Pneumococcal Conjugate-13 09/26/2015  . Pneumococcal Polysaccharide-23 12/01/1993, 08/19/2017    Past Medical History:  Diagnosis Date  . Allergy     . Anemia, unspecified   . Arthritis   . Asthma   . Atrial fibrillation status post cardioversion [facility]) 09/2015   s/p TEE/DCCV>>SR on amio  . Benign neoplasm of colon   . Carpal tunnel syndrome   . Cellulitis and abscess of finger, unspecified   . Cerumen impaction   . Cervicalgia   . CHF (congestive heart failure) ([place])   . Chronic airway obstruction, not elsewhere classified   . Chronic anticoagulation 09/2015   Eliquis  . Diarrhea   . Diffuse cystic mastopathy   . Edema   . Encounter for long-term (current) use of other medications   . GERD (gastroesophageal reflux disease)   . Heart murmur   . Lumbago   . Neck pain 05/23/2015  . Obesity, unspecified   . Other and unspecified hyperlipidemia   . Other psoriasis   . Pain in joint, lower leg   . PONV (postoperative nausea and vomiting)   . Primary localized osteoarthrosis of right shoulder 12/18/2015  . 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 Scoliosis (and kyphoscoliosis), idiopathic   . Shortness of breath dyspnea    with exertion  . Type II or unspecified type diabetes mellitus without mention of complication, uncontrolled   . Unspecified essential hypertension   . Unspecified hemorrhoids without mention of complication   . Unspecified hypothyroidism   . Urge incontinence     Tobacco History: Social History   Tobacco Use  Smoking Status Former Smoker  . Packs/day: 1.00  . Years: 40.00  . Pack years: 40.00  . Quit date: 09/07/1989  . Years since quitting: 30.7  Smokeless Tobacco Never Used   Counseling given: Not Answered   Outpatient Medications Prior to Visit  Medication Sig Dispense Refill  . acetaminophen (TYLENOL) 650 MG CR tablet Take 1,300 mg by mouth every 8 (eight) hours as needed for pain.    Marland Kitchen albuterol (PROVENTIL) (2.5 MG/3ML) 0.083% nebulizer solution USE 1 VIAL IN  NEBULIZER EVERY 4 HOURS AND AS NEEDED. Generic: VENTOLIN 60 vial 10  . albuterol (VENTOLIN HFA) 108 (90 Base) MCG/ACT inhaler Inhale 2 puffs into the lungs every 6 (six) hours as needed for wheezing or shortness of breath. 6.7 g 3  . amiodarone (PACERONE) 200 MG tablet Take 1 tablet (200 mg total) by mouth daily. 30 tablet 2  . azelastine (ASTELIN) 0.1 % nasal spray Place 1 spray into both nostrils 2 (two) times daily. Use in each nostril as directed 30 mL 1  . Blood Glucose Monitoring Suppl (ONETOUCH VERIO) w/Device KIT Check blood sugar twice daily DX:E11.9 1 kit 0  . cetirizine (ZYRTEC) 10 MG tablet Take 10 mg by mouth daily as needed for allergies.     . cholecalciferol (VITAMIN D) 1000 units tablet Take 1,000 Units by mouth at bedtime.     Marland Kitchen Dextromethorphan-guaiFENesin (MUCINEX DM) 30-600 MG TB12 Take 2 tablets by mouth as needed.    . doxazosin (CARDURA) 4 MG tablet TAKE 1 TABLET BY MOUTH EVERY DAY TO HELP CONTROL BLOOD PRESSURE 90 tablet 1  . ferrous sulfate 325 (65 FE) MG EC tablet Take 325  mg by mouth 2 (two) times daily.    . Fluticasone-Umeclidin-Vilant (TRELEGY ELLIPTA) 200-62.5-25 MCG/INH AEPB Inhale 1 puff into the lungs daily. 28 each 5  . furosemide (LASIX) 40 MG tablet Take 2 tablets (80 mg total) by mouth every morning. Take an extra 40 mg tablet every Monday, Wednesday, and Friday at lunch time with one extra potassium tablet. 90 tablet 3  . glimepiride (AMARYL) 1 MG tablet TAKE 1 TABLET(1 MG) BY MOUTH DAILY WITH BREAKFAST 90 tablet 1  . glucose blood test strip 1 each by Other route 2 (two) times daily. OneTouch Verio DX E11.8 200 each 11  . JANUVIA 50 MG tablet TAKE 1 TABLET(50 MG) BY MOUTH DAILY 30 tablet 6  . Lancets (ONETOUCH ULTRASOFT) lancets 1 each by Other route 2 (two) times daily. OneTouch Verio DX E11.9 200 each 11  . levothyroxine (SYNTHROID) 88 MCG tablet TAKE 1 TABLET BY MOUTH DAILY ON AN EMPTY STOMACH 90 tablet 1  . OXYGEN Inhale 2 L into the lungs continuous. Pt  also uses 4L pulse via POC    . pantoprazole (PROTONIX) 40 MG tablet TAKE 1 TABLET(40 MG) BY MOUTH TWICE DAILY 180 tablet 1  . potassium gluconate 595 (99 K) MG TABS tablet Take 595 mg by mouth 2 (two) times daily. Take one extra tablet on Monday, Wednesday, and Friday with extra 40 mg Lasix dose.    . rosuvastatin (CRESTOR) 10 MG tablet TAKE 1 TABLET(10 MG) BY MOUTH DAILY 90 tablet 1  . predniSONE (DELTASONE) 10 MG tablet Take 4 tabs po daily x 3 days; then 3 tabs daily x3 days; then 2 tabs daily x3 days; then 1 tab daily x 3 days; then stop (Patient not taking: Reported on 06/14/2020) 30 tablet 0   No facility-administered medications prior to visit.    Review of Systems  Review of Systems  Constitutional: Negative.   HENT: Negative.   Respiratory: Positive for shortness of breath. Negative for chest tightness.   Cardiovascular: Negative.     Physical Exam  BP (!) 144/60 (BP Location: Right Arm, Cuff Size: Large)   Pulse 66   Temp (!) 97.3 F (36.3 C) (Oral)   Ht _0  (1.6 m)   Wt 216 lb (98 kg)   SpO2 97%   BMI 38.26 kg/m  Physical Exam Constitutional:      Appearance: Normal appearance.  HENT:     Head: Normocephalic and atraumatic.     Mouth/Throat:     Mouth: Mucous membranes are moist.     Pharynx: Oropharynx is clear.  Cardiovascular:     Rate and Rhythm: Normal rate and regular rhythm.  Pulmonary:     Breath sounds: Rales present.  Skin:    General: Skin is warm and dry.  Neurological:     General: No focal deficit present.     Mental Status: She is alert and oriented to person, place, and time. Mental status is at baseline.  Psychiatric:        Mood and Affect: Mood normal.        Behavior: Behavior normal.        Thought Content: Thought content normal.        Judgment: Judgment normal.      Lab Results:  CBC    Component Value Date/Time   WBC 5.3 05/23/2020 1211   RBC 3.44 (L) 05/23/2020 1211   HGB 10.5 (L) 05/23/2020 1211   HGB 10.3 (L)  08/09/2019 1044   HCT 32.1 (L)  05/23/2020 1211   HCT 32.1 (L) 08/09/2019 1044   PLT 136.0 (L) 05/23/2020 1211   PLT 194 08/09/2019 1044   MCV 93.2 05/23/2020 1211   MCV 87 08/09/2019 1044   MCH 27.2 02/03/2020 0906   MCHC 32.6 05/23/2020 1211   RDW 17.0 (H) 05/23/2020 1211   RDW 15.1 08/09/2019 1044   LYMPHSABS 0.6 (L) 05/23/2020 1211   LYMPHSABS 2.0 04/28/2016 0940   MONOABS 0.5 05/23/2020 1211   EOSABS 0.1 05/23/2020 1211   EOSABS 0.2 04/28/2016 0940   BASOSABS 0.0 05/23/2020 1211   BASOSABS 0.0 04/28/2016 0940    BMET    Component Value Date/Time   NA 139 05/23/2020 1211   NA 144 08/09/2019 1044   K 4.2 05/23/2020 1211   CL 97 05/23/2020 1211   CO2 39 (H) 05/23/2020 1211   GLUCOSE 211 (H) 05/23/2020 1211   BUN 40 (H) 05/23/2020 1211   BUN 38 (H) 08/09/2019 1044   CREATININE 1.63 (H) 05/23/2020 1211   CREATININE 1.87 (H) 02/03/2020 0906   CALCIUM 9.6 05/23/2020 1211   GFRNONAA 25 (L) 02/03/2020 0906   GFRAA 29 (L) 02/03/2020 0906    BNP    Component Value Date/Time   BNP 943.8 (H) 07/25/2019 1805    ProBNP    Component Value Date/Time   PROBNP 835.0 (H) 07/15/2019 1222    Imaging: CT Chest High Resolution  Result Date: 06/01/2020 CLINICAL DATA:  Increased shortness of breath, history of COPD, oxygen dependent EXAM: CT CHEST WITHOUT CONTRAST TECHNIQUE: Multidetector CT imaging of the chest was performed following the standard protocol without intravenous contrast. High resolution imaging of the lungs, as well as inspiratory and expiratory imaging, was performed. COMPARISON:  10/08/2017 FINDINGS: Cardiovascular: Aortic atherosclerosis. Aortic valve stent endograft. Cardiomegaly. Coronary artery calcifications. No pericardial effusion. Mediastinum/Nodes: No enlarged mediastinal, hilar, or axillary lymph nodes. Thyroid gland, trachea, and esophagus demonstrate no significant findings. Lungs/Pleura: Mild centrilobular emphysema. Bibasilar scarring and atelectasis,  worse in the right lung base and increased on the right when compared to prior examination dated 10/08/2017. There is some non dependent irregular peripheral interstitial opacity and septal thickening, particularly in the peripheral left lower lobe and lingula. No significant air trapping on expiratory phase imaging. No pleural effusion or pneumothorax. Upper Abdomen: No acute abnormality. Musculoskeletal: No chest wall mass or suspicious bone lesions identified. IMPRESSION: 1. Bibasilar scarring and atelectasis, worse in the right lung base and increased on the right when compared to prior examination dated 10/08/2017. There is some non dependent irregular peripheral interstitial opacity and septal thickening, particularly in the peripheral left lower lobe and lingula. Overall findings favor sequelae of prior infection or inflammation, particularly aspiration, however an underlying component of mild fibrotic interstitial lung disease is difficult to exclude. If strictly characterized by ATS pulmonary fibrosis criteria, findings are in an early "indeterminate for UIP" pattern. Consider follow-up ILD protocol CT if there is high persistent clinical concern for fibrotic interstitial lung disease. Findings are indeterminate for UIP per consensus guidelines: Diagnosis of Idiopathic Pulmonary Fibrosis: An Official ATS/ERS/JRS/ALAT Clinical Practice Guideline. [place], Iss 5, [id number], Jun 27 2017. 2. Mild underlying emphysema. Emphysema (ICD10-J43.9). 3. Cardiomegaly and coronary artery disease. 4. Aortic valve stent endograft. Aortic Atherosclerosis (ICD10-I70.0). Electronically Signed   By: Eddie Candle M.D.   On: 06/01/2020 13:04     Assessment & Plan:   COPD (chronic obstructive pulmonary disease) (HCC) - Mixed COPD/Asthma. Continue Trelegy 200 - We will reschedule  PFTs  Obesity hypoventilation syndrome (HCC) - ABGs showed hypercarbia, recommend checking in-lap sleep study.  Consider BIPAP vs trilogy treatment. Patient would like to hold off on this as she is claustrophobia and does not feel confident that she would be able to wear masK. Encouraged her to think this over and speak with her daughter who is a respiratory therapist   Chronic respiratory failure with hypoxia ([place]) - Maintained on 2L continuous oxygen 24/7 (uses 4L POC)  Abnormal CT of the chest - HRCT chest in August 2021 showed scarring and atelectasis, worse in the right lung base and increased compared to 10/08/2017. Overall findings favor prior infection or inflammation, particularly aspiration, however an underlying component of mild fibrotic interstitial lung disease is difficult to exclude - Patient needs repeat HRCT with supine/prone positioning in 8-12 months   Martyn Ehrich, NP 06/17/2020

## 2020-06-14 NOTE — Patient Instructions (Addendum)
CT chest showed scarring and atelectasis, worse in the right lung base and increased compared to 10/08/2017. Overall findings favor prior infection or inflammation, particularly aspiration, however an underlying component of mild fibrotic interstitial lung disease is difficult to exclude  Oxygen: - Continue 2L oxygen on exertion  Recommendations: - Read over aspiration precautions  - Consider in-lab sleep study for: hypercarbic respiratory failure- may need BIPAP/Trilogy   Orders: - Please re-schedule PFTs-  first available  - Needs HRCT in 8-12 months to monitor for ILD   Follow-up: - Televisit with Beth or MR after PFTs to review results    Aspiration Precautions, Adult Aspiration is the breathing in (inhalation) of a liquid or object into the lungs. Things that can be inhaled into the lungs include:  Food.  Any type of liquid, such as drinks or saliva.  Stomach contents, such as vomit or stomach acid. What are the signs of aspiration? Signs of aspiration include:  Coughing after swallowing food or liquids.  Clearing the throat often while eating.  Trouble breathing. This may include: ? Breathing quickly. ? Breathing very slowly. ? Loud breathing. ? Rumbling sounds from the lungs while breathing.  Coughing up phlegm (sputum) that: ? Is yellow, tan, or green. ? Has pieces of food in it. ? Is bad-smelling.  Having a hoarse, barky cough.  Not being able to speak.  A hoarse voice.  Drooling while eating.  A feeling of fullness in the throat or a feeling that something is stuck in the throat.  Choking often.  Having a runny noise while eating.  Coughing when lying down or having to sit up quickly after lying down.  A change in skin color. The skin may look red or blue.  Fever.  Watery eyes.  Pain in the chest or back.  A pained look on the face. What are the complications of aspiration? Complications of aspiration include:  Losing weight because  the person is not absorbing needed nutrients.  Loss of enjoyment and the social benefits of eating.  Choking.  Lung irritation, if someone aspirates acidic food or drinks.  Lung infection (pneumonia).  Collection of infected liquid (pus) in the lungs (lung abscess). In serious cases, death can occur. What can I do to prevent aspiration? Caring for someone who has a feeding tube If you are caring for someone who has a feeding tube who cannot eat or drink safely through his or her mouth:  Keep the person in an upright position as much as possible.  Do not lay the person flat if he or she is getting continuous feedings. If you need to lay the person flat for any reason, turn the feeding pump off.  Check feeding tube residuals as told by your health care provider. Ask your health care provider what residual amount is too high. Caring for someone who can eat and drink safely by mouth If you are caring for someone who can eat and drink safely through his or her mouth:  Have the person sit in an upright position when eating food or drinking fluids. This can be done in two ways: ? Have the person sit up in a chair. ? If sitting in a chair is not possible, position the person in bed so he or she is upright.  Remind the person to eat slowly and chew well. Make sure the person is awake and alert while eating.  Do not distract the person. This is especially important for people with thinking or memory (  cognitive) problems.  Allow foods to cool. Hot foods may be more difficult to swallow.  Provide small meals more frequently, instead of 3 large meals. This may reduce fatigue during eating.  Check the person's mouth thoroughly for leftover food after eating.  Keep the person sitting upright for 30-45 minutes after eating.  Do not serve food or drink during 2 hours or more before bedtime. General instructions Follow these general guidelines to prevent aspiration in someone who can eat and  drink safely by mouth:  Never put food or liquids in the mouth of a person who is not fully alert.  Feed small amounts of food. Do not force feed.  For a person who is on a diet for swallowing difficulty (dysphagia diet), follow the recommended food and drink consistency. For example, in dysphagia diet level 1, thicken liquids to pudding-like consistency.  Use as little water as possible when brushing the person's teeth or cleaning his or her mouth.  Provide oral care before and after meals.  Use adaptive devices such as cut-out cups, straws, or utensils as told by the health care provider.  Crush pills and put them in soft food such as pudding or ice cream. Some pills should not be crushed. Check with the health care provider before crushing any medicine. Contact a health care provider if:  The person has a feeding tube, and the feeding tube residual amount is too high.  The person has a fever.  The person tries to avoid food or water, such as refusing to eat, drink, or be fed, or is eating less than normal.  The person may have aspirated food or liquid.  You notice warning signs, such as choking or coughing, when the person eats or drinks. Get help right away if:  The person has trouble breathing or starts to breathe quickly.  The person is breathing very slowly or stops breathing.  The person coughs a lot after eating or drinking.  The person has a long-lasting (chronic) cough.  The person coughs up thick, yellow, or tan sputum.  If someone is choking on food or an object, perform the Heimlich maneuver (abdominal thrusts).  The person has symptoms of pneumonia, such as: ? Coughing a lot. ? Coughing up mucus with a bad smell or blood in it. ? Feeling short of breath. ? Complaining of chest pain. ? Sweating, fever, and chills. ? Feeling tired. ? Complaining of trouble breathing. ? Wheezing.  The person cannot stop choking.  The person is unable to breathe, turns  blue, faints, or seems confused. These symptoms may represent a serious problem that is an emergency. Do not wait to see if the symptoms will go away. Get medical help right away. Call your local emergency services (911 in the [place]  Summary  Aspiration is the breathing in (inhalation) of a liquid or object into the lungs. Things that can be inhaled into the lungs include food, liquids, saliva, or stomach contents.  Aspiration can cause pneumonia or choking.  One sign of aspiration is coughing after swallowing food or liquids.  Contact a health care provider if you notice signs of aspiration. This information is not intended to replace advice given to you by your health care provider. Make sure you discuss any questions you have with your health care provider. Document Revised: 09/25/2017 Document Reviewed: 07/10/2016 Elsevier Patient Education  [place].

## 2020-06-17 DIAGNOSIS — R9389 Abnormal findings on diagnostic imaging of other specified body structures: Secondary | ICD-10-CM | POA: Insufficient documentation

## 2020-06-17 NOTE — Assessment & Plan Note (Signed)
-  HRCT chest in August 2021 showed scarring and atelectasis, worse in the right lung base and increased compared to 10/08/2017. Overall findings favor prior infection or inflammation, particularly aspiration, however an underlying component of mild fibrotic interstitial lung disease is difficult to exclude - Patient needs repeat HRCT with supine/prone positioning in 8-12 months

## 2020-06-17 NOTE — Assessment & Plan Note (Signed)
-  ABGs showed hypercarbia, recommend checking in-lap sleep study. Consider BIPAP vs trilogy treatment. Patient would like to hold off on this as she is claustrophobia and does not feel confident that she would be able to wear masK. Encouraged her to think this over and speak with her daughter who is a respiratory therapist

## 2020-06-17 NOTE — Assessment & Plan Note (Addendum)
-  Mixed COPD/Asthma. Continue Trelegy 200 - We will reschedule PFTs

## 2020-06-17 NOTE — Assessment & Plan Note (Signed)
-  Maintained on 2L continuous oxygen 24/7 (uses 4L POC)

## 2020-06-19 ENCOUNTER — Ambulatory Visit (INDEPENDENT_AMBULATORY_CARE_PROVIDER_SITE_OTHER): Payer: Medicare Other | Admitting: Internal Medicine

## 2020-06-19 ENCOUNTER — Other Ambulatory Visit: Payer: Self-pay

## 2020-06-19 DIAGNOSIS — J849 Interstitial pulmonary disease, unspecified: Secondary | ICD-10-CM

## 2020-06-19 LAB — PULMONARY FUNCTION TEST
DL/VA % pred: 90 %
DL/VA: 3.74 ml/min/mmHg/L
DLCO cor % pred: 64 %
DLCO cor: 11.32 ml/min/mmHg
DLCO unc % pred: 58 %
DLCO unc: 10.17 ml/min/mmHg
FEF 25-75 Post: 1.16 L/sec
FEF 25-75 Pre: 0.62 L/sec
FEF2575-%Change-Post: 88 %
FEF2575-%Pred-Post: 93 %
FEF2575-%Pred-Pre: 49 %
FEV1-%Change-Post: 17 %
FEV1-%Pred-Post: 67 %
FEV1-%Pred-Pre: 57 %
FEV1-Post: 1.16 L
FEV1-Pre: 0.99 L
FEV1FVC-%Change-Post: 0 %
FEV1FVC-%Pred-Pre: 96 %
FEV6-%Change-Post: 16 %
FEV6-%Pred-Post: 74 %
FEV6-%Pred-Pre: 63 %
FEV6-Post: 1.62 L
FEV6-Pre: 1.39 L
FEV6FVC-%Pred-Post: 106 %
FEV6FVC-%Pred-Pre: 106 %
FVC-%Change-Post: 16 %
FVC-%Pred-Post: 69 %
FVC-%Pred-Pre: 59 %
FVC-Post: 1.62 L
FVC-Pre: 1.39 L
Post FEV1/FVC ratio: 72 %
Post FEV6/FVC ratio: 100 %
Pre FEV1/FVC ratio: 71 %
Pre FEV6/FVC Ratio: 100 %
RV % pred: 83 %
RV: 1.92 L
TLC % pred: 72 %
TLC: 3.45 L

## 2020-06-19 NOTE — Progress Notes (Signed)
Please let patient know she has moderate COPD based on her PFTs. She did have significant improvement after receiving bronchodilator. We can go over in detail at next apt on 8/30.

## 2020-06-19 NOTE — Progress Notes (Signed)
PFT done today. 

## 2020-06-22 ENCOUNTER — Ambulatory Visit (HOSPITAL_COMMUNITY)
Admission: RE | Admit: 2020-06-22 | Discharge: 2020-06-22 | Disposition: A | Discharge status: Home / Self Care | Payer: Medicare Other | Source: Ambulatory Visit | Attending: Gastroenterology | Admitting: Gastroenterology

## 2020-06-22 ENCOUNTER — Other Ambulatory Visit: Payer: Self-pay

## 2020-06-22 DIAGNOSIS — D509 Iron deficiency anemia, unspecified: Secondary | ICD-10-CM | POA: Insufficient documentation

## 2020-06-22 MED ORDER — SODIUM CHLORIDE 0.9 % IV SOLN
510.0000 mg | Freq: Once | INTRAVENOUS | Status: AC
  Administered 2020-06-22: 510 mg via INTRAVENOUS
  Filled 2020-06-22: qty 17

## 2020-06-25 ENCOUNTER — Ambulatory Visit (INDEPENDENT_AMBULATORY_CARE_PROVIDER_SITE_OTHER): Payer: Medicare Other | Admitting: Primary Care

## 2020-06-25 ENCOUNTER — Encounter: Payer: Self-pay | Admitting: Primary Care

## 2020-06-25 ENCOUNTER — Other Ambulatory Visit: Payer: Self-pay

## 2020-06-25 DIAGNOSIS — E662 Morbid (severe) obesity with alveolar hypoventilation: Secondary | ICD-10-CM

## 2020-06-25 NOTE — Progress Notes (Signed)
Virtual Visit via Video Note  I connected with Patricia [name] on 06/25/20 at 10:30 AM EDT by a video enabled telemedicine application and verified that I am speaking with the correct person using two identifiers.  Location: Patient: Home Provider: Office   I discussed the limitations of evaluation and management by telemedicine and the availability of in person appointments. The patient expressed understanding and agreed to proceed.  History of Present Illness:    84 year old female, former smoker quit in [place] (40 pack year hx). PMH significant for COPD, obesity hypoventilation syndrome, CHF, Afib, HTN, aortic stenosis, s/p TAVR in 2017, GIB. Patient of Dr. Chase Caller.  Breo 200 changed to Trelegy in May 2021.   Previous [facility] pulmonary encounters: 07/07/2019 Patient contacted today for acute televisit. Report increased shortness of breath, wheezing x 5 days. No associated cough or congestion. Using Breo 200 as prescribed, using albuterol nebulizer 4x day with not much improvement. She does not weigh herself daily but reports it styas within 5 lbs. She does have feet swelling. She takes lasix 33m daily as prescribed. Reports that when she gets short of breath her heart rate goes up. Denies sick contact or covid exposure. No chest pain, cough.   03/02/2020 Patient presents today for follow-up visit for shortness of breath. She called our office on 4/23 with reports of increased shortness of breath, chest tightness x 1 week with associated nasal congestion/PND. Sent in prednisone taper. She is on 2L continuous at home and 4L pulsed. She is compliant with BREO 200 once daily. Her breathing has varied over the last year. States that she has good days and bad days. She did improve with oral steriod but symptoms returned soon after after stopped. Reports post nasal drip with oxygen use. She takes zyrtec daily.  06/14/2020 Patient presents today for follow-up. She was scheduled for PFTs yesterday, she tells  me that she checked in at the front desk but waited for an hour without having testing done. She ended up going home as she was very frustrated. She will need to be re-scheduled for PFTs. She had HRCT on 05/31/20 that showed increased right bibasilar scarring/atelevtasis. There was some non-dependent irregular peripheral interstitial opacity and septal thickening. Findings favor sequelae or prior infection.inflammation, partucularly aspiration. Discussed results with patient, she reports that she has aspirated in the past but not recently. Breathing is unchanged, she continued to experience basline dyspnea on exertion. She remains on 2L continuous oxygen at home and 4L POC. No acute complaints today.   06/25/2020- Interim hx Patient contacted today to review recent PFT results. Pulmonary function testing completed on 06/19/20 showed moderate obstructive lung disease with reversibility, moderate diffusion defect. Her lung function has not significantly changed since 2019. She remains on Trelegy 200 one puff daily. ABGs showed hypercarbia, she has decided to go ahead with recommend split night sleep study to assess for hypoventilation. May need BIPAP vs trilogy. She is maintained on 2L continuous oxygen. Needs repeat HRCT in 8-12 months to monitor mild fibrosis.   Observations/Objective:  - Able to speak in full sentences; no overt shortness of breath or wheezing - She reports throwing her back out last week- will be calling her PCP   Spirometry: 08/06/2018 Spirometry- FVC 1.3 (51%), FEV1 0.9 (47%), ratio 69   Pulmonary function test:  06/19/20 post-BD - FVC 1.62 (69%), 1.16 (67%), ratio 72, +BD, DLCOcor 11.32 (64) Moderate obstruction with reversibility, decreased diffusion defect   Assessment and Plan:  COPD GOLD II: -  PFTs showed moderate obstruction with reversibility and moderate diffusion defect - No significant change in lung function since 2019, no previous diffusion capacity to compare  -  Continue Trelegy 200 one puffs daily - Needs HRCT in 8-12 months/ to monitor mild fibrosis (already ordered)  Obesity hypoventilation syndrome: - ABGs showed hypercarbia, patient agreeing to split-night sleep study to assess for potential need for BIPAP vs Triology   Chronic respiratory failure: - Continues 2L continuous oxygen 24/7 (4L pulsed oxygen on portable concentrator)  Follow Up Instructions:  - 8 weeks with MR or APP   I discussed the assessment and treatment plan with the patient. The patient was provided an opportunity to ask questions and all were answered. The patient agreed with the plan and demonstrated an understanding of the instructions.   The patient was advised to call back or seek an in-person evaluation if the symptoms worsen or if the condition fails to improve as anticipated.  I provided 18 minutes of non-face-to-face time during this encounter.   Martyn Ehrich, NP

## 2020-06-25 NOTE — Patient Instructions (Addendum)
Pulmonary function testing is relatively the same as your spirometry in 2019. You have moderate obstructive lung disease with reversibility, decreased diffusion capacity. You have mixed COPD with an underlying asthmatic component. You are on the right inhaler for this with the Trelegy 200. No changes today. We will order split-night sleep study in the clinic to assess of hypoventilation/treatment options.   Orders: Split night sleep study re: obesity hypoventilation syndrome  Follow-up: 8 weeks with Dr. Chase Caller or APP

## 2020-06-26 DIAGNOSIS — D500 Iron deficiency anemia secondary to blood loss (chronic): Secondary | ICD-10-CM | POA: Diagnosis not present

## 2020-06-27 NOTE — Progress Notes (Signed)
Cardiology Office Note:    Date:  06/28/2020   ID:  SKI POLICH, DOB Oct 26, 1936, MRN [id number]  PCP:  [name], NP  Cardiologist:  [name] Martinique, MD   Referring MD: [name], NP   Chief Complaint  Patient presents with  . Follow-up    History of Present Illness:    Patricia [name] is a 84 y.o. female with a hx of persistent atrial fibrillation, hypertension, obesity, diabetes mellitus, hyperlipidemia, anemia, aortic stenosis status post TAVR (2017), and chronic systolic and diastolic heart failure.  She was recently discharged from the hospital on 07/29/2019.  She was seen and evaluated for acute respiratory failure with hypoxia, which was felt to be multifactorial given his obesity, COPD, and acute on chronic systolic heart failure.  She was also found to be in persistent atrial fibrillation which was suspected to be the etiology of her worsening LV function and exacerbation of heart failure.  She underwent TEE guided cardioversion on 07/28/2019 with successful conversion to normal sinus rhythm.  She is anticoagulated on apixaban 2.5 mg twice daily times at least 4 weeks.  She has a history of profound anemia on anticoagulation in the past.  She will need routine CBCs during her 4 weeks of anticoagulation.  Last echocardiogram on 07/27/2019 with an EF of 25 to 30%, LVH with diffuse hypokinesis with septal dyskinesis, severely dilated left atrium, mild mitral stenosis, and no significant aortic regurgitation. Will repeat an echo after three months of NSR.   I last saw her on 08/09/19 with complaints of continued increased work of breathing. Increased lasix of 120 mg daily helped her breathing but not her swelling. I instructed 80 mg daily lasix with an extra 40 mg on MWF. ON follow up one month later with Dr. Martinique, volume was improved. She was last seen 12/05/19 with Dr. Martinique and reported poor breathing and fluctuations in her weight.   She returns today for follow up. She  feels stable - "no better, no worse." She is unable to exert herself due to hypoxia and tachycardia. She is fairly sedentary. The most strenuous activity she does is house cleaning. She feels her fluid is stable. She sometimes has trouble carrying in groceries. Also has trouble bending over without dyspnea and hypoxia. BP is elevated in the office, but she reports white coat syndrome. She is generally 827M systolic at home.    Past Medical History:  Diagnosis Date  . Allergy   . Anemia, unspecified   . Arthritis   . Asthma   . Atrial fibrillation status post cardioversion [facility]) 09/2015   s/p TEE/DCCV>>SR on amio  . Benign neoplasm of colon   . Carpal tunnel syndrome   . Cellulitis and abscess of finger, unspecified   . Cerumen impaction   . Cervicalgia   . CHF (congestive heart failure) ([place])   . Chronic airway obstruction, not elsewhere classified   . Chronic anticoagulation 09/2015   Eliquis  . Diarrhea   . Diffuse cystic mastopathy   . Edema   . Encounter for long-term (current) use of other medications   . GERD (gastroesophageal reflux disease)   . Heart murmur   . Lumbago   . Neck pain 05/23/2015  . Obesity, unspecified   . Other and unspecified hyperlipidemia   . Other psoriasis   . Pain in joint, lower leg   . PONV (postoperative nausea and vomiting)   . Primary localized osteoarthrosis of right shoulder 12/18/2015  . S/P TAVR (  transcatheter aortic valve replacement) 10/14/2016   23 mm Edwards Sapien 3 transcatheter heart valve placed via percutaneous left transfemoral approach  . Scoliosis (and kyphoscoliosis), idiopathic   . Shortness of breath dyspnea    with exertion  . Type II or unspecified type diabetes mellitus without mention of complication, uncontrolled   . Unspecified essential hypertension   . Unspecified hemorrhoids without mention of complication   . Unspecified hypothyroidism   . Urge incontinence     Past Surgical History:  Procedure Laterality  Date  . ABDOMINAL HYSTERECTOMY  1987   complete  . BREAST BIOPSY Right   . BREAST EXCISIONAL BIOPSY Left 2011  . BREAST EXCISIONAL BIOPSY Right    x2  . BREAST SURGERY     right breast 3 surgeries [phone]  . CARDIAC CATHETERIZATION N/A 09/02/2016   Procedure: Right/Left Heart Cath and Coronary Angiography;  Surgeon: [name] M Martinique, MD;  Location: [place] CV LAB;  Service: Cardiovascular;  Laterality: N/A;  . CARDIOVERSION N/A 10/19/2015   Procedure: CARDIOVERSION;  Surgeon: Lelon Perla, MD;  Location: [facility] ENDOSCOPY;  Service: Cardiovascular;  Laterality: N/A;  . CARDIOVERSION N/A 11/13/2017   Procedure: CARDIOVERSION;  Surgeon: Jerline Pain, MD;  Location: [facility] ENDOSCOPY;  Service: Cardiovascular;  Laterality: N/A;  . CARDIOVERSION N/A 07/28/2019   Procedure: CARDIOVERSION;  Surgeon: Buford Dresser, MD;  Location: [place];  Service: Cardiovascular;  Laterality: N/A;  . [place]  . COLON SURGERY     2004,2007,2013.  3 times colon surgieres  . ESOPHAGOGASTRODUODENOSCOPY (EGD) WITH PROPOFOL Left 03/13/2017   Procedure: ESOPHAGOGASTRODUODENOSCOPY (EGD) WITH PROPOFOL;  Surgeon: Ronnette Juniper, MD;  Location: [place];  Service: Gastroenterology;  Laterality: Left;  . ESOPHAGOGASTRODUODENOSCOPY (EGD) WITH PROPOFOL Left 01/22/2018   Procedure: ESOPHAGOGASTRODUODENOSCOPY (EGD) WITH PROPOFOL;  Surgeon: Arta Silence, MD;  Location: [facility] ENDOSCOPY;  Service: Endoscopy;  Laterality: Left;  . EXCISE LE MANDIBULAR LYMPH NODE T     DR C. NEWMAN  . FOOT SURGERY  1989  . GIVENS CAPSULE STUDY Left 01/23/2018   Procedure: GIVENS CAPSULE STUDY;  Surgeon: Wilford Corner, MD;  Location: [facility] ENDOSCOPY;  Service: Endoscopy;  Laterality: Left;  . LEFT SHOULDER ARTHROSCOPY    . 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 NEUROPLASTY / TRANSPOSITION MEDIAN NERVE AT CARPAL TUNNEL BILATERAL  05/2003  . TEE WITHOUT CARDIOVERSION N/A 10/19/2015   Procedure: Transesophageal  Echocardiogram (TEE) ;  Surgeon: Lelon Perla, MD;  Location: [facility] ENDOSCOPY;  Service: Cardiovascular;  Laterality: N/A;  . TEE WITHOUT CARDIOVERSION N/A 10/14/2016   Procedure: TRANSESOPHAGEAL ECHOCARDIOGRAM (TEE);  Surgeon: Sherren Mocha, MD;  Location: [place];  Service: Open Heart Surgery;  Laterality: N/A;  . TEE WITHOUT CARDIOVERSION N/A 07/28/2019   Procedure: TRANSESOPHAGEAL ECHOCARDIOGRAM (TEE);  Surgeon: Buford Dresser, MD;  Location: [facility] ENDOSCOPY;  Service: Cardiovascular;  Laterality: N/A;  . TOTAL SHOULDER ARTHROPLASTY Right 12/18/2015   Procedure: TOTAL SHOULDER ARTHROPLASTY;  Surgeon: [name], MD;  Location: [place];  Service: Orthopedics;  Laterality: Right;  . TRANSCATHETER AORTIC VALVE REPLACEMENT, TRANSFEMORAL N/A 10/14/2016   Procedure: TRANSCATHETER AORTIC VALVE REPLACEMENT, TRANSFEMORAL;  Surgeon: Sherren Mocha, MD;  Location: [place];  Service: Open Heart Surgery;  Laterality: N/A;  . TRIGGER FINGER RELEASE Left   . VAGINAL CYST REMOVED  1967    Current Medications: Current Meds  Medication Sig  . acetaminophen (TYLENOL) 650 MG CR tablet Take 1,300 mg by mouth every 8 (eight) hours as needed for pain.  Marland Kitchen albuterol (PROVENTIL) (  2.5 MG/3ML) 0.083% nebulizer solution USE 1 VIAL IN NEBULIZER EVERY 4 HOURS AND AS NEEDED. Generic: VENTOLIN  . albuterol (VENTOLIN HFA) 108 (90 Base) MCG/ACT inhaler Inhale 2 puffs into the lungs every 6 (six) hours as needed for wheezing or shortness of breath.  Marland Kitchen amiodarone (PACERONE) 200 MG tablet Take 1 tablet (200 mg total) by mouth daily.  Marland Kitchen azelastine (ASTELIN) 0.1 % nasal spray Place 1 spray into both nostrils 2 (two) times daily. Use in each nostril as directed  . Blood Glucose Monitoring Suppl (ONETOUCH VERIO) w/Device KIT Check blood sugar twice daily DX:E11.9  . cetirizine (ZYRTEC) 10 MG tablet Take 10 mg by mouth daily as needed for allergies.   . cholecalciferol (VITAMIN D) 1000 units tablet Take 1,000 Units by mouth at  bedtime.   Marland Kitchen Dextromethorphan-guaiFENesin (MUCINEX DM) 30-600 MG TB12 Take 2 tablets by mouth as needed.  . doxazosin (CARDURA) 4 MG tablet TAKE 1 TABLET BY MOUTH EVERY DAY TO HELP CONTROL BLOOD PRESSURE  . ferrous sulfate 325 (65 FE) MG EC tablet Take 325 mg by mouth 2 (two) times daily.  . Fluticasone-Umeclidin-Vilant (TRELEGY ELLIPTA) 200-62.5-25 MCG/INH AEPB Inhale 1 puff into the lungs daily.  . furosemide (LASIX) 40 MG tablet Take 2 tablets (80 mg total) by mouth every morning. Take an extra 40 mg tablet every Monday, Wednesday, and Friday at lunch time with one extra potassium tablet.  Marland Kitchen glimepiride (AMARYL) 1 MG tablet TAKE 1 TABLET(1 MG) BY MOUTH DAILY WITH BREAKFAST  . glucose blood test strip 1 each by Other route 2 (two) times daily. OneTouch Verio DX E11.8  . JANUVIA 50 MG tablet TAKE 1 TABLET(50 MG) BY MOUTH DAILY  . Lancets (ONETOUCH ULTRASOFT) lancets 1 each by Other route 2 (two) times daily. OneTouch Verio DX E11.9  . levothyroxine (SYNTHROID) 88 MCG tablet TAKE 1 TABLET BY MOUTH DAILY ON AN EMPTY STOMACH  . OXYGEN Inhale 2 L into the lungs continuous. Pt also uses 4L pulse via POC  . pantoprazole (PROTONIX) 40 MG tablet TAKE 1 TABLET(40 MG) BY MOUTH TWICE DAILY  . potassium gluconate 595 (99 K) MG TABS tablet Take 595 mg by mouth 2 (two) times daily. Take one extra tablet on Monday, Wednesday, and Friday with extra 40 mg Lasix dose.  . predniSONE (DELTASONE) 10 MG tablet Take 4 tabs po daily x 3 days; then 3 tabs daily x3 days; then 2 tabs daily x3 days; then 1 tab daily x 3 days; then stop  . rosuvastatin (CRESTOR) 10 MG tablet TAKE 1 TABLET(10 MG) BY MOUTH DAILY     Allergies:   Codeine and Vesicare [solifenacin]   Social History   Socioeconomic History  . Marital status: Divorced    Spouse name: Not on file  . Number of children: 1  . Years of education: Not on file  . Highest education level: Not on file  Occupational History  . Occupation: Retired Market researcher  for [employer]  Tobacco Use  . Smoking status: Former Smoker    Packs/day: 1.00    Years: 40.00    Pack years: 40.00    Quit date: 09/07/1989    Years since quitting: 30.8  . Smokeless tobacco: Never Used  Vaping Use  . Vaping Use: Never used  Substance and Sexual Activity  . Alcohol use: No    Alcohol/week: 0.0 standard drinks  . Drug use: No  . Sexual activity: Never  Other Topics Concern  . Not on file  Social History  Narrative   Her daughter & 3 grandchildren live in the area.     Social Determinants of Health   Financial Resource Strain:   . Difficulty of Paying Living Expenses: Not on file  Food Insecurity:   . Worried About Charity fundraiser in the Last Year: Not on file  . Ran Out of Food in the Last Year: Not on file  Transportation Needs:   . Lack of Transportation (Medical): Not on file  . Lack of Transportation (Non-Medical): Not on file  Physical Activity:   . Days of Exercise per Week: Not on file  . Minutes of Exercise per Session: Not on file  Stress:   . Feeling of Stress : Not on file  Social Connections:   . Frequency of Communication with Friends and Family: Not on file  . Frequency of Social Gatherings with Friends and Family: Not on file  . Attends Religious Services: Not on file  . Active Member of Clubs or Organizations: Not on file  . Attends Archivist Meetings: Not on file  . Marital Status: Not on file     Family History: The patient's family history includes Arthritis in her sister; Asthma in her sister; Diabetes in her father; Heart disease in her brother and father; Liver cancer in her sister; Stroke in her father. There is no history of Breast cancer.  ROS:   Please see the history of present illness.     All other systems reviewed and are negative.  EKGs/Labs/Other Studies Reviewed:    The following studies were reviewed today:  Echo 10/2019: 1. Left ventricular ejection fraction, by visual estimation, is 40  to  45%. The left ventricle has mildly decreased function. There is moderately  increased left ventricular hypertrophy.  2. Abnormal septal motion consistent with left bundle branch block.  3. Left ventricular diastolic function could not be evaluated.  4. The left ventricle demonstrates global hypokinesis.  5. Global right ventricle has normal systolic function.The right  ventricular size is normal. No increase in right ventricular wall  thickness.  6. 23 mm Edwards S3 TAVR. V max 3.5 m/s, MG 26 mmHG, DI 0.36, EOA 1.01  cm2. Gradients are increased but stable compared with 10/14/2017. No  paravalvular leak.  7. Left atrial size was severely dilated.  8. Moderate to severe mitral annular calcification.  9. The mitral valve is degenerative. Moderate mitral valve regurgitation.  Mild mitral stenosis.  10. The tricuspid valve is grossly normal.  11. Aortic valve regurgitation is not visualized.  12. The pulmonic valve was grossly normal. Pulmonic valve regurgitation is  trivial.  13. Moderately elevated pulmonary artery systolic pressure.  14. The inferior vena cava is dilated in size with <50% respiratory  variability, suggesting right atrial pressure of 15 mmHg.  15. The tricuspid regurgitant velocity is 3.59 m/s, and with an assumed  right atrial pressure of 15 mmHg, the estimated right ventricular systolic  pressure is moderately elevated at 66.6 mmHg.   EKG:  EKG is  ordered today.  The ekg ordered today demonstrates  Sinus rhythm HR 73 with LBBB (old)  Recent Labs: 07/15/2019: Pro B Natriuretic peptide (BNP) 835.0 07/25/2019: B Natriuretic Peptide 943.8 07/26/2019: Magnesium 2.3 11/07/2019: ALT 15 02/03/2020: TSH 3.91 05/23/2020: BUN 40; Creatinine, Ser 1.63; Hemoglobin 10.5; Platelets 136.0; Potassium 4.2; Sodium 139  Recent Lipid Panel    Component Value Date/Time   CHOL 156 11/07/2019 0926   CHOL 182 04/28/2016 0940   TRIG 138 11/07/2019  0926   HDL 73 11/07/2019 0926     HDL 61 04/28/2016 0940   CHOLHDL 2.1 11/07/2019 0926   VLDL 19 06/11/2017 0832   LDLCALC 61 11/07/2019 0926    Physical Exam:    VS:  BP (!) 162/62   Pulse 73   Ht _0  (1.6 m)   Wt 216 lb (98 kg)   BMI 38.26 kg/m     Wt Readings from Last 3 Encounters:  06/28/20 216 lb (98 kg)  06/14/20 216 lb (98 kg)  05/23/20 (!) 217 lb 12.8 oz (98.8 kg)     GEN: obese female, wearing O2, NAD HEENT: Normal NECK: No JVD; No carotid bruits LYMPHATICS: No lymphadenopathy CARDIAC: RRR, no murmurs, rubs, gallops RESPIRATORY:  Clear to auscultation without rales, wheezing or rhonchi  ABDOMEN: Soft, non-tender, non-distended MUSCULOSKELETAL:  No edema; No deformity  SKIN: Warm and dry NEUROLOGIC:  Alert and oriented x 3 PSYCHIATRIC:  Normal affect   ASSESSMENT:    1. Paroxysmal atrial fibrillation (HCC)   2. Chronic combined systolic and diastolic heart failure ([place])   3. COPD with acute exacerbation (HCC)   4. Stage 3a chronic kidney disease   5. Hyperlipidemia, unspecified hyperlipidemia type    PLAN:    In order of problems listed above:  Chronic systolic and diastolic heart failure - diuretic regimen: 80/40 lasix daily - EF improved with TAVR and restoration of sinus rhythm - EF now 40-45% - pt appears to be stable, no complaints, no changes   Atrial fibrillation status post DCCV 2016, 2019, 2020 - This patients CHA2DS2-VASc Score and unadjusted Ischemic Stroke Rate (% per year) is equal to 9.7 % stroke rate/year from a score of 6 (female, age2, CHF, CAD, DM).  - off eliquis now given at least 3 GIB episodes - continue amiodarone 200 mg   CKD stage III - sCr 1.63 04/2020   Hyperlipidemia 11/07/2019: Cholesterol 156; HDL 73; LDL Cholesterol (Calc) 61; Triglycerides 138 - Continue crestor 10 mg   History of anemia on anticoagulation - off anticoagulation   AS s/p TAVR (2017) - last echo 10/2019 - AV prosthesis stable   Follow up in 6 months.   Medication  Adjustments/Labs and Tests Ordered: Current medicines are reviewed at length with the patient today.  Concerns regarding medicines are outlined above.  Orders Placed This Encounter  Procedures  . EKG 12-Lead   No orders of the defined types were placed in this encounter.   Signed, Ledora Bottcher, PA  06/28/2020 1:39 PM    [facility]

## 2020-06-28 ENCOUNTER — Ambulatory Visit: Payer: Medicare Other | Admitting: Physician Assistant

## 2020-06-28 ENCOUNTER — Encounter: Payer: Medicare Other | Admitting: Nurse Practitioner

## 2020-06-28 ENCOUNTER — Other Ambulatory Visit: Payer: Self-pay

## 2020-06-28 ENCOUNTER — Encounter: Payer: Self-pay | Admitting: Physician Assistant

## 2020-06-28 VITALS — BP 162/62 | HR 73 | Ht 63.0 in | Wt 216.0 lb

## 2020-06-28 DIAGNOSIS — N1831 Chronic kidney disease, stage 3a: Secondary | ICD-10-CM | POA: Diagnosis not present

## 2020-06-28 DIAGNOSIS — E785 Hyperlipidemia, unspecified: Secondary | ICD-10-CM | POA: Diagnosis not present

## 2020-06-28 DIAGNOSIS — I480 Paroxysmal atrial fibrillation: Secondary | ICD-10-CM | POA: Diagnosis not present

## 2020-06-28 DIAGNOSIS — I5042 Chronic combined systolic (congestive) and diastolic (congestive) heart failure: Secondary | ICD-10-CM | POA: Diagnosis not present

## 2020-06-28 DIAGNOSIS — J441 Chronic obstructive pulmonary disease with (acute) exacerbation: Secondary | ICD-10-CM | POA: Diagnosis not present

## 2020-06-28 NOTE — Patient Instructions (Signed)
Medication Instructions:  Your physician recommends that you continue on your current medications as directed. Please refer to the Current Medication list given to you today.  *If you need a refill on your cardiac medications before your next appointment, please call your pharmacy*  Follow-Up: At [facility], you and your health needs are our priority.  As part of our continuing mission to provide you with exceptional heart care, we have created designated Provider Care Teams.  These Care Teams include your primary Cardiologist (physician) and Advanced Practice Providers (APPs -  Physician Assistants and Nurse Practitioners) who all work together to provide you with the care you need, when you need it.  We recommend signing up for the patient portal called "MyChart".  Sign up information is provided on this After Visit Summary.  MyChart is used to connect with patients for Virtual Visits (Telemedicine).  Patients are able to view lab/test results, encounter notes, upcoming appointments, etc.  Non-urgent messages can be sent to your provider as well.   To learn more about what you can do with MyChart, go to [email or web address].    Your next appointment:   6 month(s)  The format for your next appointment:   In Person  Provider:   [name] Martinique, MD

## 2020-06-29 ENCOUNTER — Other Ambulatory Visit: Payer: Self-pay

## 2020-06-29 MED ORDER — AMIODARONE HCL 200 MG PO TABS: 200.0000 mg | ORAL_TABLET | Freq: Every day | ORAL | 11 refills | Status: AC

## 2020-06-30 DIAGNOSIS — J449 Chronic obstructive pulmonary disease, unspecified: Secondary | ICD-10-CM | POA: Diagnosis not present

## 2020-07-09 ENCOUNTER — Other Ambulatory Visit: Payer: Medicare Other

## 2020-07-10 ENCOUNTER — Other Ambulatory Visit: Payer: Self-pay | Admitting: Nurse Practitioner

## 2020-07-10 ENCOUNTER — Other Ambulatory Visit: Payer: Self-pay

## 2020-07-10 ENCOUNTER — Encounter: Payer: Self-pay | Admitting: Nurse Practitioner

## 2020-07-10 ENCOUNTER — Ambulatory Visit (INDEPENDENT_AMBULATORY_CARE_PROVIDER_SITE_OTHER): Payer: Medicare Other | Admitting: Nurse Practitioner

## 2020-07-10 DIAGNOSIS — E2839 Other primary ovarian failure: Secondary | ICD-10-CM

## 2020-07-10 DIAGNOSIS — R195 Other fecal abnormalities: Secondary | ICD-10-CM

## 2020-07-10 DIAGNOSIS — Z0000 Encounter for general adult medical examination without abnormal findings: Secondary | ICD-10-CM | POA: Diagnosis not present

## 2020-07-10 NOTE — Progress Notes (Signed)
Subjective:   Patricia [name] is a 84 y.o. female who presents for Medicare Annual (Subsequent) preventive examination.  Review of Systems     Cardiac Risk Factors include: obesity (BMI >30kg/m2);sedentary lifestyle;advanced age (>35mn, >>58women);dyslipidemia;diabetes mellitus;hypertension     Objective:    There were no vitals filed for this visit. There is no height or weight on file to calculate BMI.  Advanced Directives 07/10/2020 02/08/2020 11/09/2019 10/12/2019 07/28/2019 07/25/2019 07/25/2019  Does Patient Have a Medical Advance Directive? _0  No Yes  Type of AArts administratorPower of ALykensof A[place]of [place] Does patient want to make changes to medical advance directive? No - Patient declined No - Patient declined No - Patient declined No - Patient declined - No - Patient declined No - Patient declined  Copy of HGaylordin Chart? Yes - validated most recent copy scanned in chart (See row information) Yes - validated most recent copy scanned in chart (See row information) Yes - validated most recent copy scanned in chart (See row information) Yes - validated most recent copy scanned in chart (See row information) No - copy requested No - copy requested Yes - validated most recent copy scanned in chart (See row information)  Would patient like information on creating a medical advance directive? - - - - - No - Patient declined -    Current Medications (verified) Outpatient Encounter Medications as of 07/10/2020  Medication Sig  . acetaminophen (TYLENOL) 650 MG CR tablet Take 1,300 mg by mouth every 8 (eight) hours as needed for pain.  .Marland Kitchenalbuterol (PROVENTIL) (2.5 MG/3ML) 0.083% nebulizer solution USE 1 VIAL IN NEBULIZER EVERY 4 HOURS AND AS NEEDED. Generic: VENTOLIN  . albuterol  (VENTOLIN HFA) 108 (90 Base) MCG/ACT inhaler Inhale 2 puffs into the lungs every 6 (six) hours as needed for wheezing or shortness of breath.  .Marland Kitchenamiodarone (PACERONE) 200 MG tablet Take 1 tablet (200 mg total) by mouth daily.  .Marland Kitchenazelastine (ASTELIN) 0.1 % nasal spray Place 1 spray into both nostrils 2 (two) times daily. Use in each nostril as directed  . Blood Glucose Monitoring Suppl (ONETOUCH VERIO) w/Device KIT Check blood sugar twice daily DX:E11.9  . cetirizine (ZYRTEC) 10 MG tablet Take 10 mg by mouth daily as needed for allergies.   . cholecalciferol (VITAMIN D) 1000 units tablet Take 1,000 Units by mouth at bedtime.   . colestipol (COLESTID) 1 g tablet Take 1 g by mouth 2 (two) times daily.  .Marland KitchenDextromethorphan-guaiFENesin (MUCINEX DM) 30-600 MG TB12 Take 2 tablets by mouth as needed.  . doxazosin (CARDURA) 4 MG tablet TAKE 1 TABLET BY MOUTH EVERY DAY TO HELP CONTROL BLOOD PRESSURE  . ferrous sulfate 325 (65 FE) MG EC tablet Take 325 mg by mouth 2 (two) times daily.  . Fluticasone-Umeclidin-Vilant (TRELEGY ELLIPTA) 200-62.5-25 MCG/INH AEPB Inhale 1 puff into the lungs daily.  . furosemide (LASIX) 40 MG tablet Take 2 tablets (80 mg total) by mouth every morning. Take an extra 40 mg tablet every Monday, Wednesday, and Friday at lunch time with one extra potassium tablet.  .Marland Kitchenglimepiride (AMARYL) 1 MG tablet TAKE 1 TABLET(1 MG) BY MOUTH DAILY WITH BREAKFAST  . glucose blood test strip 1 each by Other route 2 (two) times daily. OneTouch Verio DX E11.8  . JANUVIA 50 MG tablet TAKE 1  TABLET(50 MG) BY MOUTH DAILY  . Lancets (ONETOUCH ULTRASOFT) lancets 1 each by Other route 2 (two) times daily. OneTouch Verio DX E11.9  . levothyroxine (SYNTHROID) 88 MCG tablet TAKE 1 TABLET BY MOUTH DAILY ON AN EMPTY STOMACH  . losartan (COZAAR) 25 MG tablet Take 25 mg by mouth daily.  . OXYGEN Inhale 2 L into the lungs continuous. Pt also uses 4L pulse via POC  . pantoprazole (PROTONIX) 40 MG tablet TAKE 1  TABLET(40 MG) BY MOUTH TWICE DAILY  . potassium gluconate 595 (99 K) MG TABS tablet Take 595 mg by mouth 2 (two) times daily. Take one extra tablet on Monday, Wednesday, and Friday with extra 40 mg Lasix dose.  . predniSONE (DELTASONE) 10 MG tablet Take 4 tabs po daily x 3 days; then 3 tabs daily x3 days; then 2 tabs daily x3 days; then 1 tab daily x 3 days; then stop  . rosuvastatin (CRESTOR) 10 MG tablet TAKE 1 TABLET(10 MG) BY MOUTH DAILY   No facility-administered encounter medications on file as of 07/10/2020.    Allergies (verified) Codeine and Vesicare [solifenacin]   History: Past Medical History:  Diagnosis Date  . Allergy   . Anemia, unspecified   . Arthritis   . Asthma   . Atrial fibrillation status post cardioversion [facility]) 09/2015   s/p TEE/DCCV>>SR on amio  . Benign neoplasm of colon   . Carpal tunnel syndrome   . Cellulitis and abscess of finger, unspecified   . Cerumen impaction   . Cervicalgia   . CHF (congestive heart failure) ([place])   . Chronic airway obstruction, not elsewhere classified   . Chronic anticoagulation 09/2015   Eliquis  . Diarrhea   . Diffuse cystic mastopathy   . Edema   . Encounter for long-term (current) use of other medications   . GERD (gastroesophageal reflux disease)   . Heart murmur   . Lumbago   . Neck pain 05/23/2015  . Obesity, unspecified   . Other and unspecified hyperlipidemia   . Other psoriasis   . Pain in joint, lower leg   . PONV (postoperative nausea and vomiting)   . Primary localized osteoarthrosis of right shoulder 12/18/2015  . 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 Scoliosis (and kyphoscoliosis), idiopathic   . Shortness of breath dyspnea    with exertion  . Type II or unspecified type diabetes mellitus without mention of complication, uncontrolled   . Unspecified essential hypertension   .  Unspecified hemorrhoids without mention of complication   . Unspecified hypothyroidism   . Urge incontinence    Past Surgical History:  Procedure Laterality Date  . ABDOMINAL HYSTERECTOMY  1987   complete  . BREAST BIOPSY Right   . BREAST EXCISIONAL BIOPSY Left 2011  . BREAST EXCISIONAL BIOPSY Right    x2  . BREAST SURGERY     right breast 3 surgeries [phone]  . CARDIAC CATHETERIZATION N/A 09/02/2016   Procedure: Right/Left Heart Cath and Coronary Angiography;  Surgeon: [name] M Martinique, MD;  Location: [place] CV LAB;  Service: Cardiovascular;  Laterality: N/A;  . CARDIOVERSION N/A 10/19/2015   Procedure: CARDIOVERSION;  Surgeon: Lelon Perla, MD;  Location: [facility] ENDOSCOPY;  Service: Cardiovascular;  Laterality: N/A;  . CARDIOVERSION N/A 11/13/2017   Procedure: CARDIOVERSION;  Surgeon: Jerline Pain, MD;  Location: [place];  Service: Cardiovascular;  Laterality: N/A;  . CARDIOVERSION  N/A 07/28/2019   Procedure: CARDIOVERSION;  Surgeon: Buford Dresser, MD;  Location: [place];  Service: Cardiovascular;  Laterality: N/A;  . [place]  . COLON SURGERY     2004,2007,2013.  3 times colon surgieres  . ESOPHAGOGASTRODUODENOSCOPY (EGD) WITH PROPOFOL Left 03/13/2017   Procedure: ESOPHAGOGASTRODUODENOSCOPY (EGD) WITH PROPOFOL;  Surgeon: Ronnette Juniper, MD;  Location: [place];  Service: Gastroenterology;  Laterality: Left;  . ESOPHAGOGASTRODUODENOSCOPY (EGD) WITH PROPOFOL Left 01/22/2018   Procedure: ESOPHAGOGASTRODUODENOSCOPY (EGD) WITH PROPOFOL;  Surgeon: Arta Silence, MD;  Location: [facility] ENDOSCOPY;  Service: Endoscopy;  Laterality: Left;  . EXCISE LE MANDIBULAR LYMPH NODE T     DR C. NEWMAN  . FOOT SURGERY  1989  . GIVENS CAPSULE STUDY Left 01/23/2018   Procedure: GIVENS CAPSULE STUDY;  Surgeon: Wilford Corner, MD;  Location: [facility] ENDOSCOPY;  Service: Endoscopy;  Laterality: Left;  . LEFT SHOULDER ARTHROSCOPY    . 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 NEUROPLASTY / TRANSPOSITION MEDIAN NERVE AT CARPAL TUNNEL BILATERAL  05/2003  . TEE WITHOUT CARDIOVERSION N/A 10/19/2015   Procedure: Transesophageal Echocardiogram (TEE) ;  Surgeon: Lelon Perla, MD;  Location: [facility] ENDOSCOPY;  Service: Cardiovascular;  Laterality: N/A;  . TEE WITHOUT CARDIOVERSION N/A 10/14/2016   Procedure: TRANSESOPHAGEAL ECHOCARDIOGRAM (TEE);  Surgeon: Sherren Mocha, MD;  Location: [place];  Service: Open Heart Surgery;  Laterality: N/A;  . TEE WITHOUT CARDIOVERSION N/A 07/28/2019   Procedure: TRANSESOPHAGEAL ECHOCARDIOGRAM (TEE);  Surgeon: Buford Dresser, MD;  Location: [facility] ENDOSCOPY;  Service: Cardiovascular;  Laterality: N/A;  . TOTAL SHOULDER ARTHROPLASTY Right 12/18/2015   Procedure: TOTAL SHOULDER ARTHROPLASTY;  Surgeon: [name], MD;  Location: [place];  Service: Orthopedics;  Laterality: Right;  . TRANSCATHETER AORTIC VALVE REPLACEMENT, TRANSFEMORAL N/A 10/14/2016   Procedure: TRANSCATHETER AORTIC VALVE REPLACEMENT, TRANSFEMORAL;  Surgeon: Sherren Mocha, MD;  Location: [place];  Service: Open Heart Surgery;  Laterality: N/A;  . TRIGGER FINGER RELEASE Left   . VAGINAL CYST REMOVED  1967   Family History  Problem Relation Age of Onset  . Diabetes Father   . Stroke Father   . Heart disease Father   . Liver cancer Sister   . Heart disease Brother   . Asthma Sister   . Arthritis Sister   . Breast cancer Neg Hx    Social History   Socioeconomic History  . Marital status: Divorced    Spouse name: Not on file  . Number of children: 1  . Years of education: Not on file  . Highest education level: Not on file  Occupational History  . Occupation: Retired Market researcher for [employer]  Tobacco Use  . Smoking status: Former Smoker    Packs/day: 1.00    Years: 40.00    Pack years: 40.00    Quit date: 09/07/1989    Years since quitting: 30.8  . Smokeless tobacco: Never Used  Vaping Use  . Vaping Use: Never used  Substance and  Sexual Activity  . Alcohol use: No    Alcohol/week: 0.0 standard drinks  . Drug use: No  . Sexual activity: Never  Other Topics Concern  . Not on file  Social History Narrative   Her daughter & 3 grandchildren live in the area.     Social Determinants of Health   Financial Resource Strain:   . Difficulty of Paying Living Expenses: Not on file  Food Insecurity:   . Worried About Charity fundraiser in the Last Year: Not  on file  . Ran Out of Food in the Last Year: Not on file  Transportation Needs:   . Lack of Transportation (Medical): Not on file  . Lack of Transportation (Non-Medical): Not on file  Physical Activity:   . Days of Exercise per Week: Not on file  . Minutes of Exercise per Session: Not on file  Stress:   . Feeling of Stress : Not on file  Social Connections:   . Frequency of Communication with Friends and Family: Not on file  . Frequency of Social Gatherings with Friends and Family: Not on file  . Attends Religious Services: Not on file  . Active Member of Clubs or Organizations: Not on file  . Attends Archivist Meetings: Not on file  . Marital Status: Not on file    Tobacco Counseling Counseling given: Not Answered   Clinical Intake:  Pre-visit preparation completed: Yes  Pain : No/denies pain     BMI - recorded: 38 Nutritional Status: BMI > 30  Obese Nutritional Risks: None Diabetes: Yes CBG done?: No Did pt. bring in CBG monitor from home?: No  How often do you need to have someone help you when you read instructions, pamphlets, or other written materials from your doctor or pharmacy?: 1 - Never  Diabetic?yes        Activities of Daily Living In your present state of health, do you have any difficulty performing the following activities: 07/10/2020 07/26/2019  Hearing? N N  Vision? Y N  Difficulty concentrating or making decisions? Y N  Comment slower -  Walking or climbing stairs? Y N  Comment due to low back pain and knee  pain -  Dressing or bathing? N N  Doing errands, shopping? N N  Preparing Food and eating ? N -  Using the Toilet? N -  In the past six months, have you accidently leaked urine? Y -  Do you have problems with loss of bowel control? N -  Managing your Medications? N -  Managing your Finances? N -  Housekeeping or managing your Housekeeping? N -  Some recent data might be hidden    Patient Care Team: [name], NP as PCP - General (Geriatric Medicine) Martinique, [name] M, MD as PCP - Cardiology (Cardiology) [name], MD as Consulting Physician (Orthopedic Surgery) Druscilla Brownie, MD as Consulting Physician (Dermatology) Ralene Bathe, MD as Consulting Physician (Ophthalmology)  Indicate any recent Medical Services you may have received from other than [facility] providers in the past year (date may be approximate).     Assessment:   This is a routine wellness examination for [facility].  Hearing/Vision screen  Hearing Screening   _0  _1  _2  _3  _4  _5  _6  _7  _8   Right ear:           Left ear:           Comments: Patient has no hearing problems.  Vision Screening Comments: Patient wears glasses. Patient has had an eye exam within past year.  Dietary issues and exercise activities discussed: Current Exercise Habits: Home exercise routine, Type of exercise: calisthenics, Time (Minutes): 10, Frequency (Times/Week): 2, Weekly Exercise (Minutes/Week): 20  Goals    . <enter goal here>     Starting 09/16/16, I will attempt to eat a heart healthy diet, for the sake of my heart conditions.     . Patient Stated     Would like to get off oxygen       Depression Screen  PHQ 2/9 Scores 07/10/2020 07/25/2019 06/24/2019 10/04/2018 12/29/2017 02/18/2017 02/09/2017  PHQ - 2 Score 0 0 0 0 0 0 0    Fall Risk Fall Risk  07/10/2020 02/08/2020 11/09/2019 10/12/2019 07/25/2019  Falls in the past year? 1 0 0 0 1  Comment - - - - -  Number falls in past yr: 1 0 0 0 0    Injury with Fall? 0 0 - 0 0  Risk for fall due to : - - - - -    Any stairs in or around the home? Yes  If so, are there any without handrails? Yes  Home free of loose throw rugs in walkways, pet beds, electrical cords, etc? Yes  Adequate lighting in your home to reduce risk of falls? Yes   ASSISTIVE DEVICES UTILIZED TO PREVENT FALLS:  Life alert? No  Use of a cane, walker or w/c? Yes  Grab bars in the bathroom? Yes  Shower chair or bench in shower? Yes  Elevated toilet seat or a handicapped toilet? Yes   TIMED UP AND GO:  Was the test performed? No .   Cognitive Function: MMSE - Mini Mental State Exam 12/29/2017 09/16/2016  Orientation to time 3 5  Orientation to Place 5 5  Registration 3 3  Attention/ Calculation 5 3  Recall 1 3  Language- name 2 objects 2 2  Language- repeat 1 1  Language- follow 3 step command 3 3  Language- read & follow direction 1 1  Write a sentence 1 1  Copy design 1 1  Total score 26 28     6CIT Screen 07/10/2020 06/24/2019  What Year? 0 points 0 points  What month? 0 points 0 points  What time? 0 points 0 points  Count back from 20 0 points 0 points  Months in reverse 0 points 0 points  Repeat phrase 0 points 2 points  Total Score 0 2    Immunizations Immunization History  Administered Date(s) Administered  . DTaP 08/03/2003  . Fluad Quad(high Dose 65+) 07/29/2019  . Influenza, High Dose Seasonal PF 06/15/2017, 09/02/2018  . Influenza,inj,Quad PF,6+ Mos 09/29/2013, 07/19/2014, 09/26/2015, 09/13/2016  . Influenza-Unspecified 11/04/2012  . PFIZER SARS-COV-2 Vaccination 01/01/2020, 01/31/2020  . Pneumococcal Conjugate-13 09/26/2015  . Pneumococcal Polysaccharide-23 12/01/1993, 08/19/2017    TDAP status: Due, Education has been provided regarding the importance of this vaccine. Advised may receive this vaccine at local pharmacy or Health Dept. Aware to provide a copy of the vaccination record if obtained from local pharmacy or Health  Dept. Verbalized acceptance and understanding. Flu shot due  Pneumococcal vaccine status: Up to date Covid-19 vaccine status: Completed vaccines  Qualifies for Shingles Vaccine? Yes   Zostavax completed No   Shingrix Completed?: No.    Education has been provided regarding the importance of this vaccine. Patient has been advised to call insurance company to determine out of pocket expense if they have not yet received this vaccine. Advised may also receive vaccine at local pharmacy or Health Dept. Verbalized acceptance and understanding.  Screening Tests Health Maintenance  Topic Date Due  . OPHTHALMOLOGY EXAM  05/28/2019  . INFLUENZA VACCINE  05/27/2020  . TETANUS/TDAP  02/07/2021 (Originally 08/12/1955)  . HEMOGLOBIN A1C  08/04/2020  . FOOT EXAM  11/08/2020  . COLONOSCOPY  03/19/2021  . DEXA SCAN  Completed  . COVID-19 Vaccine  Completed  . PNA vac Low Risk Adult  Completed    Health Maintenance  Health Maintenance Due  Topic Date  Due  . OPHTHALMOLOGY EXAM  05/28/2019  . INFLUENZA VACCINE  05/27/2020    Colorectal cancer screening: No longer required.  Mammogram status: Completed  . Repeat every year Pt declines bone density   Lung Cancer Screening: (Low Dose CT Chest recommended if Age 53-80 years, 30 pack-year currently smoking OR have quit w/in 15years.) does not qualify.   Lung Cancer Screening Referral: na  Additional Screening:  Hepatitis C Screening: does not qualify; Completed na  Vision Screening: Recommended annual ophthalmology exams for early detection of glaucoma and other disorders of the eye. Is the patient up to date with their annual eye exam?  No  Who is the provider or what is the name of the office in which the patient attends annual eye exams? Going next month, Dr Bing Plume If pt is not established with a provider, would they like to be referred to a provider to establish care? No .   Dental Screening: Recommended annual dental exams for proper oral  hygiene  Community Resource Referral / Chronic Care Management: CRR required this visit?  No   CCM required this visit?  No      Plan:     I have personally reviewed and noted the following in the patient's chart:   . Medical and social history . Use of alcohol, tobacco or illicit drugs  . Current medications and supplements . Functional ability and status . Nutritional status . Physical activity . Advanced directives . List of other physicians . Hospitalizations, surgeries, and ER visits in previous 12 months . Vitals . Screenings to include cognitive, depression, and falls . Referrals and appointments  In addition, I have reviewed and discussed with patient certain preventive protocols, quality metrics, and best practice recommendations. A written personalized care plan for preventive services as well as general preventive health recommendations were provided to patient.     [name], NP   07/10/2020    Virtual Visit via Telephone Note  I connected with@ on 07/10/20 at  2:45 PM EDT by telephone and verified that I am speaking with the correct person using two identifiers.  Location: Patient: home Provider: [facility]   I discussed the limitations, risks, security and privacy concerns of performing an evaluation and management service by telephone and the availability of in person appointments. I also discussed with the patient that there may be a patient responsible charge related to this service. The patient expressed understanding and agreed to proceed.   I discussed the assessment and treatment plan with the patient. The patient was provided an opportunity to ask questions and all were answered. The patient agreed with the plan and demonstrated an understanding of the instructions.   The patient was advised to call back or seek an in-person evaluation if the symptoms worsen or if the condition fails to improve as anticipated.  I provided 19 minutes  of non-face-to-face time during this encounter.  Carlos American. Harle Battiest Avs printed and mailed

## 2020-07-10 NOTE — Patient Instructions (Signed)
Patricia [name] , Thank you for taking time to come for your Medicare Wellness Visit. I appreciate your ongoing commitment to your health goals. Please review the following plan we discussed and let me know if I can assist you in the future.   Screening recommendations/referrals: Colonoscopy aged out Mammogram yearly Bone Density you are due Recommended yearly ophthalmology/optometry visit for glaucoma screening and checkup Recommended yearly dental visit for hygiene and checkup  Vaccinations: Influenza vaccine RECOMMENDED To get at this time Pneumococcal vaccine- up to date Tdap vaccine RECOMMENDED to get at this time- will send Rx to pharmacy Shingles vaccine recommended.     Advanced directives: to bring updated document.   Conditions/risks identified: obesity, sedentary lifestyle, advance age.   Next appointment: 1 year.    Preventive Care 82 Years and Older, Female Preventive care refers to lifestyle choices and visits with your health care provider that can promote health and wellness. What does preventive care include?  A yearly physical exam. This is also called an annual well check.  Dental exams once or twice a year.  Routine eye exams. Ask your health care provider how often you should have your eyes checked.  Personal lifestyle choices, including:  Daily care of your teeth and gums.  Regular physical activity.  Eating a healthy diet.  Avoiding tobacco and drug use.  Limiting alcohol use.  Practicing safe sex.  Taking low-dose aspirin every day.  Taking vitamin and mineral supplements as recommended by your health care provider. What happens during an annual well check? The services and screenings done by your health care provider during your annual well check will depend on your age, overall health, lifestyle risk factors, and family history of disease. Counseling  Your health care provider may ask you questions about your:  Alcohol use.  Tobacco use.   Drug use.  Emotional well-being.  Home and relationship well-being.  Sexual activity.  Eating habits.  History of falls.  Memory and ability to understand (cognition).  Work and work Statistician.  Reproductive health. Screening  You may have the following tests or measurements:  Height, weight, and BMI.  Blood pressure.  Lipid and cholesterol levels. These may be checked every 5 years, or more frequently if you are over 18 years old.  Skin check.  Lung cancer screening. You may have this screening every year starting at age 58 if you have a 30-pack-year history of smoking and currently smoke or have quit within the past 15 years.  Fecal occult blood test (FOBT) of the stool. You may have this test every year starting at age 56.  Flexible sigmoidoscopy or colonoscopy. You may have a sigmoidoscopy every 5 years or a colonoscopy every 10 years starting at age 50.  Hepatitis C blood test.  Hepatitis B blood test.  Sexually transmitted disease (STD) testing.  Diabetes screening. This is done by checking your blood sugar (glucose) after you have not eaten for a while (fasting). You may have this done every 1-3 years.  Bone density scan. This is done to screen for osteoporosis. You may have this done starting at age 23.  Mammogram. This may be done every 1-2 years. Talk to your health care provider about how often you should have regular mammograms. Talk with your health care provider about your test results, treatment options, and if necessary, the need for more tests. Vaccines  Your health care provider may recommend certain vaccines, such as:  Influenza vaccine. This is recommended every year.  Tetanus, diphtheria,  and acellular pertussis (Tdap, Td) vaccine. You may need a Td booster every 10 years.  Zoster vaccine. You may need this after age 71.  Pneumococcal 13-valent conjugate (PCV13) vaccine. One dose is recommended after age 62.  Pneumococcal  polysaccharide (PPSV23) vaccine. One dose is recommended after age 4. Talk to your health care provider about which screenings and vaccines you need and how often you need them. This information is not intended to replace advice given to you by your health care provider. Make sure you discuss any questions you have with your health care provider. Document Released: 11/09/2015 Document Revised: 07/02/2016 Document Reviewed: 08/14/2015 Elsevier Interactive Patient Education  2017 [place] Prevention in the Home Falls can cause injuries. They can happen to people of all ages. There are many things you can do to make your home safe and to help prevent falls. What can I do on the outside of my home?  Regularly fix the edges of walkways and driveways and fix any cracks.  Remove anything that might make you trip as you walk through a door, such as a raised step or threshold.  Trim any bushes or trees on the path to your home.  Use bright outdoor lighting.  Clear any walking paths of anything that might make someone trip, such as rocks or tools.  Regularly check to see if handrails are loose or broken. Make sure that both sides of any steps have handrails.  Any raised decks and porches should have guardrails on the edges.  Have any leaves, snow, or ice cleared regularly.  Use sand or salt on walking paths during winter.  Clean up any spills in your garage right away. This includes oil or grease spills. What can I do in the bathroom?  Use night lights.  Install grab bars by the toilet and in the tub and shower. Do not use towel bars as grab bars.  Use non-skid mats or decals in the tub or shower.  If you need to sit down in the shower, use a plastic, non-slip stool.  Keep the floor dry. Clean up any water that spills on the floor as soon as it happens.  Remove soap buildup in the tub or shower regularly.  Attach bath mats securely with double-sided non-slip rug tape.   Do not have throw rugs and other things on the floor that can make you trip. What can I do in the bedroom?  Use night lights.  Make sure that you have a light by your bed that is easy to reach.  Do not use any sheets or blankets that are too big for your bed. They should not hang down onto the floor.  Have a firm chair that has side arms. You can use this for support while you get dressed.  Do not have throw rugs and other things on the floor that can make you trip. What can I do in the kitchen?  Clean up any spills right away.  Avoid walking on wet floors.  Keep items that you use a lot in easy-to-reach places.  If you need to reach something above you, use a strong step stool that has a grab bar.  Keep electrical cords out of the way.  Do not use floor polish or wax that makes floors slippery. If you must use wax, use non-skid floor wax.  Do not have throw rugs and other things on the floor that can make you trip. What can I do with my  stairs?  Do not leave any items on the stairs.  Make sure that there are handrails on both sides of the stairs and use them. Fix handrails that are broken or loose. Make sure that handrails are as long as the stairways.  Check any carpeting to make sure that it is firmly attached to the stairs. Fix any carpet that is loose or worn.  Avoid having throw rugs at the top or bottom of the stairs. If you do have throw rugs, attach them to the floor with carpet tape.  Make sure that you have a light switch at the top of the stairs and the bottom of the stairs. If you do not have them, ask someone to add them for you. What else can I do to help prevent falls?  Wear shoes that:  Do not have high heels.  Have rubber bottoms.  Are comfortable and fit you well.  Are closed at the toe. Do not wear sandals.  If you use a stepladder:  Make sure that it is fully opened. Do not climb a closed stepladder.  Make sure that both sides of the  stepladder are locked into place.  Ask someone to hold it for you, if possible.  Clearly mark and make sure that you can see:  Any grab bars or handrails.  First and last steps.  Where the edge of each step is.  Use tools that help you move around (mobility aids) if they are needed. These include:  Canes.  Walkers.  Scooters.  Crutches.  Turn on the lights when you go into a dark area. Replace any light bulbs as soon as they burn out.  Set up your furniture so you have a clear path. Avoid moving your furniture around.  If any of your floors are uneven, fix them.  If there are any pets around you, be aware of where they are.  Review your medicines with your doctor. Some medicines can make you feel dizzy. This can increase your chance of falling. Ask your doctor what other things that you can do to help prevent falls. This information is not intended to replace advice given to you by your health care provider. Make sure you discuss any questions you have with your health care provider. Document Released: 08/09/2009 Document Revised: 03/20/2016 Document Reviewed: 11/17/2014 Elsevier Interactive Patient Education  2017 [employer].

## 2020-07-10 NOTE — Progress Notes (Signed)
This service is provided via telemedicine  No vital signs collected/recorded due to the encounter was a telemedicine visit.   Location of patient (ex: home, work):  Home  Patient consents to a telephone visit:  Yes, see encounter dated 07/10/2020  Location of the provider (ex: office, home):  [place]  Name of any referring provider:  N/A  Names of all persons participating in the telemedicine service and their role in the encounter:  Sherrie Mustache, Nurse Practitioner, Carroll Kinds, CMA, and patient.   Time spent on call:  8 minutes with medical assistant

## 2020-07-12 ENCOUNTER — Telehealth: Payer: Self-pay

## 2020-07-12 ENCOUNTER — Encounter: Payer: Self-pay | Admitting: *Deleted

## 2020-07-12 ENCOUNTER — Telehealth: Payer: Self-pay | Admitting: Nurse Practitioner

## 2020-07-12 ENCOUNTER — Telehealth: Payer: Self-pay | Admitting: Internal Medicine

## 2020-07-12 MED ORDER — ALBUTEROL SULFATE (2.5 MG/3ML) 0.083% IN NEBU: INHALATION_SOLUTION | RESPIRATORY_TRACT | 3 refills | Status: AC

## 2020-07-12 NOTE — Telephone Encounter (Signed)
Ms. letrice, pollok are scheduled for a virtual visit with your provider today.    Just as we do with appointments in the office, we must obtain your consent to participate.  Your consent will be active for this visit and any virtual visit you may have with one of our providers in the next 365 days.    If you have a MyChart account, I can also send a copy of this consent to you electronically.  All virtual visits are billed to your insurance company just like a traditional visit in the office.  As this is a virtual visit, video technology does not allow for your provider to perform a traditional examination.  This may limit your provider's ability to fully assess your condition.  If your provider identifies any concerns that need to be evaluated in person or the need to arrange testing such as labs, EKG, etc, we will make arrangements to do so.    Although advances in technology are sophisticated, we cannot ensure that it will always work on either your end or our end.  If the connection with a video visit is poor, we may have to switch to a telephone visit.  With either a video or telephone visit, we are not always able to ensure that we have a secure connection.   I need to obtain your verbal consent now.   Are you willing to proceed with your visit today?   ZEKIAH COEN has provided verbal consent on 07/10/2020 for a virtual visit (video or telephone).   Carroll Kinds, CMA 07/10/2020  2:26 pm [place]

## 2020-07-12 NOTE — Telephone Encounter (Signed)
Error.  Patient called wrong Dr. Gabriel Carina.

## 2020-07-12 NOTE — Telephone Encounter (Signed)
I talked to the patient and she wanted to wait to schedule her next AWV for 2022.

## 2020-07-12 NOTE — Telephone Encounter (Signed)
Spoke with the pt and notified that her rx was sent for albuterol sol  Nothing further needed

## 2020-07-13 ENCOUNTER — Ambulatory Visit: Payer: Medicare Other | Admitting: Nurse Practitioner

## 2020-07-25 ENCOUNTER — Other Ambulatory Visit: Payer: Medicare Other

## 2020-07-30 DIAGNOSIS — J449 Chronic obstructive pulmonary disease, unspecified: Secondary | ICD-10-CM | POA: Diagnosis not present

## 2020-08-01 ENCOUNTER — Other Ambulatory Visit: Payer: Medicare Other

## 2020-08-01 ENCOUNTER — Ambulatory Visit: Payer: Medicare Other | Admitting: Nurse Practitioner

## 2020-08-02 ENCOUNTER — Encounter (HOSPITAL_BASED_OUTPATIENT_CLINIC_OR_DEPARTMENT_OTHER): Payer: Medicare Other | Admitting: Internal Medicine

## 2020-08-03 ENCOUNTER — Encounter (HOSPITAL_COMMUNITY): Payer: Medicare Other

## 2020-08-29 ENCOUNTER — Other Ambulatory Visit: Payer: Self-pay | Admitting: Nurse Practitioner

## 2020-08-29 ENCOUNTER — Ambulatory Visit: Payer: Self-pay | Admitting: Adult Health

## 2020-08-29 DIAGNOSIS — I10 Essential (primary) hypertension: Secondary | ICD-10-CM

## 2020-08-30 ENCOUNTER — Other Ambulatory Visit: Payer: Self-pay

## 2020-08-30 ENCOUNTER — Ambulatory Visit (INDEPENDENT_AMBULATORY_CARE_PROVIDER_SITE_OTHER): Payer: Medicare Other | Admitting: Adult Health

## 2020-08-30 ENCOUNTER — Encounter: Payer: Self-pay | Admitting: Adult Health

## 2020-08-30 VITALS — BP 138/72 | HR 74 | Temp 97.7°F | Ht 63.0 in | Wt 217.2 lb

## 2020-08-30 DIAGNOSIS — J449 Chronic obstructive pulmonary disease, unspecified: Secondary | ICD-10-CM | POA: Diagnosis not present

## 2020-08-30 DIAGNOSIS — R600 Localized edema: Secondary | ICD-10-CM

## 2020-08-30 DIAGNOSIS — I5032 Chronic diastolic (congestive) heart failure: Secondary | ICD-10-CM

## 2020-08-30 MED ORDER — POTASSIUM GLUCONATE 595 (99 K) MG PO TABS: 595.0000 mg | ORAL_TABLET | Freq: Every day | ORAL | 0 refills | Status: DC

## 2020-08-30 MED ORDER — FUROSEMIDE 40 MG PO TABS: 80.0000 mg | ORAL_TABLET | Freq: Every day | ORAL | 0 refills | Status: DC

## 2020-08-30 NOTE — Patient Instructions (Signed)
Heart Failure, Diagnosis  Heart failure is a condition in which the heart has trouble pumping blood because it has become weak or stiff. This means that the heart does not pump blood well enough for the body to stay healthy. For some people with heart failure, fluid may back up into the lungs. There may also be swelling (edema) in the lower legs. Heart failure is usually a long-term (chronic) condition. It is important for you to take good care of yourself and follow the treatment plan from your health care provider. What are the causes? This condition may be caused by:  High blood pressure (hypertension). Hypertension causes the heart muscle to work harder than normal. This makes the heart stiff or weak.  Coronary artery disease, or CAD. CAD is the buildup of cholesterol and fat (plaque) in the arteries of the heart.  Heart attack, also called myocardial infarction. This injures the heart muscle, making it hard for the heart to pump blood.  Abnormal heart valves. The valves do not open and close properly, forcing the heart to pump harder to keep the blood flowing.  Heart muscle disease (cardiomyopathy or myocarditis). This is damage to the heart muscle. It can increase the risk of heart failure.  Lung disease. The heart works harder when the lungs are not healthy.  Abnormal heart rhythms. These can lead to heart failure. What increases the risk? The risk of heart failure increases as a person ages. This condition is also more likely to develop in people who:  Are overweight.  Are female.  Smoke or chew tobacco.  Abuse alcohol or illegal drugs.  Have taken medicines that can damage the heart, such as chemotherapy drugs.  Have diabetes.  Have abnormal heart rhythms.  Have thyroid problems.  Have low blood counts (anemia). What are the signs or symptoms? Symptoms of this condition include:  Shortness of breath with activity, such as when climbing stairs.  A cough that does not  go away.  Swelling of the feet, ankles, legs, or abdomen.  Losing weight for no reason.  Trouble breathing when lying flat (orthopnea).  Waking from sleep because of the need to sit up and get more air.  Rapid heartbeat.  Tiredness (fatigue) and loss of energy.  Feeling light-headed, dizzy, or close to fainting.  Loss of appetite.  Nausea.  Waking up more often during the night to urinate (nocturia).  Confusion. How is this diagnosed? This condition is diagnosed based on:  Your medical history, symptoms, and a physical exam.  Diagnostic tests, which may include: ? Echocardiogram. ? Electrocardiogram (ECG). ? Chest X-ray. ? Blood tests. ? Exercise stress test. ? Radionuclide scans. ? Cardiac catheterization and angiogram. How is this treated? Treatment for this condition is aimed at managing the symptoms of heart failure. Medicines Treatment may include medicines that:  Help lower blood pressure by relaxing (dilating) the blood vessels. These medicines are called ACE inhibitors (angiotensin-converting enzyme) and ARBs (angiotensin receptor blockers).  Cause the kidneys to remove salt and water from the blood through urination (diuretics).  Improve heart muscle strength and prevent the heart from beating too fast (beta blockers).  Increase the force of the heartbeat (digoxin). Healthy behavior changes     Treatment may also include making healthy lifestyle changes, such as:  Reaching and staying at a healthy weight.  Quitting smoking or chewing tobacco.  Eating heart-healthy foods.  Limiting or avoiding alcohol.  Stopping the use of illegal drugs.  Being physically active.  Other treatments  Other treatments may include:  Procedures to open blocked arteries or repair damaged valves.  Placing a pacemaker to improve heart function (cardiac resynchronization therapy).  Placing a device to treat serious abnormal heart rhythms (implantable cardioverter  defibrillator, or ICD).  Placing a device to improve the pumping ability of the heart (left ventricular assist device, or LVAD).  Receiving a healthy heart from a donor (heart transplant). This is done when other treatments have not helped. Follow these instructions at home:  Manage other health conditions as told by your health care provider. These may include hypertension, diabetes, thyroid disease, or abnormal heart rhythms.  Get ongoing education and support as needed. Learn as much as you can about heart failure.  Keep all follow-up visits as told by your health care provider. This is important. Summary  Heart failure is a condition in which the heart has trouble pumping blood because it has become weak or stiff.  This condition is caused by high blood pressure and other diseases of the heart and lungs.  Symptoms of this condition include shortness of breath, tiredness (fatigue), nausea, and swelling of the feet, ankles, legs, or abdomen.  Treatments for this condition may include medicines, lifestyle changes, and surgery.  Manage other health conditions as told by your health care provider. This information is not intended to replace advice given to you by your health care provider. Make sure you discuss any questions you have with your health care provider. Document Revised: 12/31/2018 Document Reviewed: 12/31/2018 Elsevier Patient Education  [place].

## 2020-08-30 NOTE — Progress Notes (Signed)
[facility] clinic  Provider:   Durenda Age - DNP  Code Status: Full Code  Goals of Care:  Advanced Directives 08/30/2020  Does Patient Have a Medical Advance Directive? Yes  Type of Advance Directive [place]  Does patient want to make changes to medical advance directive? No - Patient declined  Copy of [place] in Chart? Yes - validated most recent copy scanned in chart (See row information)  Would patient like information on creating a medical advance directive? -     Chief Complaint  Patient presents with  . Acute Visit    Right lower leg/foot swelling, some redness.  Today having a nonrproductive wet-sounding cough, states takes Mucinex.     HPI: Patient is a 84 y.o. female seen today for an acute visit for right lower leg/foot swelling and some redness. Right lower leg has 2+edema with slight redness. Skin is dry and not warm to touch. Pedal pulse noted on both feet. She does not complain of tenderness on right leg. She denies having chills nor fever.  Today, her O2 sat is 90% with O2 @ 4L/day. She said that she normally uses 2L of oxygen at home but today she has to put it at 4L due to her mask and having to go to [facility]. She lives by herself and has a daughter locally. She has nonproductive cough and takes Mucinex. She has diagnosis of  COPD and stated that she usually coughs but not more than her usual.   Past Medical History:  Diagnosis Date  . Allergy   . Anemia, unspecified   . Arthritis   . Asthma   . Atrial fibrillation status post cardioversion [facility]) 09/2015   s/p TEE/DCCV>>SR on amio  . Benign neoplasm of colon   . Carpal tunnel syndrome   . Cellulitis and abscess of finger, unspecified   . Cerumen impaction   . Cervicalgia   . CHF (congestive heart failure) ([place])   . Chronic airway obstruction, not elsewhere classified   . Chronic anticoagulation 09/2015   Eliquis  . Diarrhea   . Diffuse cystic mastopathy   . Edema    . Encounter for long-term (current) use of other medications   . GERD (gastroesophageal reflux disease)   . Heart murmur   . Lumbago   . Neck pain 05/23/2015  . Obesity, unspecified   . Other and unspecified hyperlipidemia   . Other psoriasis   . Pain in joint, lower leg   . PONV (postoperative nausea and vomiting)   . Primary localized osteoarthrosis of right shoulder 12/18/2015  . 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 Scoliosis (and kyphoscoliosis), idiopathic   . Shortness of breath dyspnea    with exertion  . Type II or unspecified type diabetes mellitus without mention of complication, uncontrolled   . Unspecified essential hypertension   . Unspecified hemorrhoids without mention of complication   . Unspecified hypothyroidism   . Urge incontinence     Past Surgical History:  Procedure Laterality Date  . ABDOMINAL HYSTERECTOMY  1987   complete  . BREAST BIOPSY Right   . BREAST EXCISIONAL BIOPSY Left 2011  . BREAST EXCISIONAL BIOPSY Right    x2  . BREAST SURGERY     right breast 3 surgeries [phone]  . CARDIAC CATHETERIZATION N/A 09/02/2016   Procedure: Right/Left Heart Cath and Coronary Angiography;  Surgeon:  [name] M Martinique, MD;  Location: [place] CV LAB;  Service: Cardiovascular;  Laterality: N/A;  . CARDIOVERSION N/A 10/19/2015   Procedure: CARDIOVERSION;  Surgeon: Lelon Perla, MD;  Location: [facility] ENDOSCOPY;  Service: Cardiovascular;  Laterality: N/A;  . CARDIOVERSION N/A 11/13/2017   Procedure: CARDIOVERSION;  Surgeon: Jerline Pain, MD;  Location: [facility] ENDOSCOPY;  Service: Cardiovascular;  Laterality: N/A;  . CARDIOVERSION N/A 07/28/2019   Procedure: CARDIOVERSION;  Surgeon: Buford Dresser, MD;  Location: [place];  Service: Cardiovascular;  Laterality: N/A;  . [place]  . COLON SURGERY      2004,2007,2013.  3 times colon surgieres  . ESOPHAGOGASTRODUODENOSCOPY (EGD) WITH PROPOFOL Left 03/13/2017   Procedure: ESOPHAGOGASTRODUODENOSCOPY (EGD) WITH PROPOFOL;  Surgeon: Ronnette Juniper, MD;  Location: [place];  Service: Gastroenterology;  Laterality: Left;  . ESOPHAGOGASTRODUODENOSCOPY (EGD) WITH PROPOFOL Left 01/22/2018   Procedure: ESOPHAGOGASTRODUODENOSCOPY (EGD) WITH PROPOFOL;  Surgeon: Arta Silence, MD;  Location: [facility] ENDOSCOPY;  Service: Endoscopy;  Laterality: Left;  . EXCISE LE MANDIBULAR LYMPH NODE T     DR C. NEWMAN  . FOOT SURGERY  1989  . GIVENS CAPSULE STUDY Left 01/23/2018   Procedure: GIVENS CAPSULE STUDY;  Surgeon: Wilford Corner, MD;  Location: [facility] ENDOSCOPY;  Service: Endoscopy;  Laterality: Left;  . LEFT SHOULDER ARTHROSCOPY    . 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 NEUROPLASTY / TRANSPOSITION MEDIAN NERVE AT CARPAL TUNNEL BILATERAL  05/2003  . TEE WITHOUT CARDIOVERSION N/A 10/19/2015   Procedure: Transesophageal Echocardiogram (TEE) ;  Surgeon: Lelon Perla, MD;  Location: [facility] ENDOSCOPY;  Service: Cardiovascular;  Laterality: N/A;  . TEE WITHOUT CARDIOVERSION N/A 10/14/2016   Procedure: TRANSESOPHAGEAL ECHOCARDIOGRAM (TEE);  Surgeon: Sherren Mocha, MD;  Location: [place];  Service: Open Heart Surgery;  Laterality: N/A;  . TEE WITHOUT CARDIOVERSION N/A 07/28/2019   Procedure: TRANSESOPHAGEAL ECHOCARDIOGRAM (TEE);  Surgeon: Buford Dresser, MD;  Location: [facility] ENDOSCOPY;  Service: Cardiovascular;  Laterality: N/A;  . TOTAL SHOULDER ARTHROPLASTY Right 12/18/2015   Procedure: TOTAL SHOULDER ARTHROPLASTY;  Surgeon: [name], MD;  Location: [place];  Service: Orthopedics;  Laterality: Right;  . TRANSCATHETER AORTIC VALVE REPLACEMENT, TRANSFEMORAL N/A 10/14/2016   Procedure: TRANSCATHETER AORTIC VALVE REPLACEMENT, TRANSFEMORAL;  Surgeon: Sherren Mocha, MD;  Location: [place];  Service: Open Heart Surgery;  Laterality: N/A;  . TRIGGER FINGER RELEASE Left   . VAGINAL  CYST REMOVED  1967    Allergies  Allergen Reactions  . Codeine Other (See Comments)    "Spaces out" the patient and she cannot walk well  . Vesicare [Solifenacin] Itching    Outpatient Encounter Medications as of 08/30/2020  Medication Sig  . acetaminophen (TYLENOL) 650 MG CR tablet Take 1,300 mg by mouth every 8 (eight) hours as needed for pain.  Marland Kitchen albuterol (PROVENTIL) (2.5 MG/3ML) 0.083% nebulizer solution USE 1 VIAL IN NEBULIZER EVERY 4 HOURS AND AS NEEDED. Generic: VENTOLIN  . albuterol (VENTOLIN HFA) 108 (90 Base) MCG/ACT inhaler Inhale 2 puffs into the lungs every 6 (six) hours as needed for wheezing or shortness of breath.  Marland Kitchen amiodarone (PACERONE) 200 MG tablet Take 1 tablet (200 mg total) by mouth daily.  Marland Kitchen azelastine (ASTELIN) 0.1 % nasal spray Place 1 spray into both nostrils 2 (two) times daily. Use in each nostril as directed  . Blood Glucose Monitoring Suppl (ONETOUCH VERIO) w/Device KIT Check blood sugar twice daily DX:E11.9  . cetirizine (ZYRTEC) 10 MG tablet Take 10 mg by mouth daily as needed for allergies.   Marland Kitchen  cholecalciferol (VITAMIN D) 1000 units tablet Take 1,000 Units by mouth at bedtime.   . colestipol (COLESTID) 1 g tablet Take 1 g by mouth 2 (two) times daily.  Marland Kitchen Dextromethorphan-guaiFENesin (MUCINEX DM) 30-600 MG TB12 Take 2 tablets by mouth as needed.  . doxazosin (CARDURA) 4 MG tablet TAKE 1 TABLET BY MOUTH EVERY DAY TO HELP CONTROL BLOOD PRESSURE  . ferrous sulfate 325 (65 FE) MG EC tablet Take 325 mg by mouth 2 (two) times daily.  . Fluticasone-Umeclidin-Vilant (TRELEGY ELLIPTA) 200-62.5-25 MCG/INH AEPB Inhale 1 puff into the lungs daily.  Marland Kitchen glimepiride (AMARYL) 1 MG tablet TAKE 1 TABLET(1 MG) BY MOUTH DAILY WITH BREAKFAST  . glucose blood test strip 1 each by Other route 2 (two) times daily. OneTouch Verio DX E11.8  . JANUVIA 50 MG tablet TAKE 1 TABLET(50 MG) BY MOUTH DAILY  . Lancets (ONETOUCH ULTRASOFT) lancets 1 each by Other route 2 (two) times daily.  OneTouch Verio DX E11.9  . levothyroxine (SYNTHROID) 88 MCG tablet TAKE 1 TABLET BY MOUTH DAILY ON AN EMPTY STOMACH  . OXYGEN Inhale 2 L into the lungs continuous. Pt also uses 4L pulse via POC  . pantoprazole (PROTONIX) 40 MG tablet TAKE 1 TABLET(40 MG) BY MOUTH TWICE DAILY  . rosuvastatin (CRESTOR) 10 MG tablet TAKE 1 TABLET(10 MG) BY MOUTH DAILY  . [DISCONTINUED] furosemide (LASIX) 40 MG tablet Take 2 tablets (80 mg total) by mouth every morning. Take an extra 40 mg tablet every Monday, Wednesday, and Friday at lunch time with one extra potassium tablet.  . [DISCONTINUED] potassium gluconate 595 (99 K) MG TABS tablet Take 595 mg by mouth 2 (two) times daily. Take one extra tablet on Monday, Wednesday, and Friday with extra 40 mg Lasix dose.  . furosemide (LASIX) 40 MG tablet Take 2 tablets (80 mg total) by mouth daily. Take 2 tabs = 80 mg daily and 1 tab = 40 mg at lunch daily  . potassium gluconate 595 (99 K) MG TABS tablet Take 1 tablet (595 mg total) by mouth daily. Take 2 tabs twice a day  . [DISCONTINUED] losartan (COZAAR) 25 MG tablet Take 25 mg by mouth daily.  . [DISCONTINUED] predniSONE (DELTASONE) 10 MG tablet Take 4 tabs po daily x 3 days; then 3 tabs daily x3 days; then 2 tabs daily x3 days; then 1 tab daily x 3 days; then stop   No facility-administered encounter medications on file as of 08/30/2020.    Review of Systems:  Review of Systems  Constitutional: Negative for activity change, appetite change, chills and fever.  HENT: Negative for congestion and sore throat.   Respiratory: Positive for cough and shortness of breath. Negative for chest tightness.        Has COPD  Cardiovascular: Positive for leg swelling.  Gastrointestinal: Negative for abdominal distention.  Genitourinary: Negative for difficulty urinating.  Musculoskeletal: Positive for gait problem.       Uses cane when going out of the house  Skin:       Redness on leg  Neurological: Negative for dizziness.    Psychiatric/Behavioral: Negative.     Health Maintenance  Topic Date Due  . OPHTHALMOLOGY EXAM  05/28/2019  . INFLUENZA VACCINE  05/27/2020  . HEMOGLOBIN A1C  08/04/2020  . TETANUS/TDAP  02/07/2021 (Originally 08/12/1955)  . FOOT EXAM  11/08/2020  . COLONOSCOPY  03/19/2021  . DEXA SCAN  Completed  . COVID-19 Vaccine  Completed  . PNA vac Low Risk Adult  Completed  Physical Exam: Vitals:   08/30/20 1418  BP: 138/72  Pulse: 74  Temp: 97.7 F (36.5 C)  TempSrc: Temporal  SpO2: 90%  Weight: 217 lb 3.2 oz (98.5 kg)  Height: _0  (1.6 m)   Body mass index is 38.48 kg/m. Physical Exam Constitutional:      Appearance: She is obese.  HENT:     Head: Normocephalic.     Mouth/Throat:     Mouth: Mucous membranes are moist.  Eyes:     Conjunctiva/sclera: Conjunctivae normal.  Cardiovascular:     Rate and Rhythm: Normal rate and regular rhythm.     Pulses: Normal pulses.  Pulmonary:     Effort: Pulmonary effort is normal.     Breath sounds: Wheezing present.  Abdominal:     General: Bowel sounds are normal.     Palpations: Abdomen is soft.  Musculoskeletal:        General: Normal range of motion.     Cervical back: Normal range of motion.     Right lower leg: Edema present.     Comments: RLE 2+edema LLE 1+edema  Neurological:     General: No focal deficit present.     Mental Status: She is alert and oriented to person, place, and time.  Psychiatric:        Mood and Affect: Mood normal.        Behavior: Behavior normal.     Labs reviewed: Basic Metabolic Panel: Recent Labs    11/07/19 0926 02/03/20 0906 05/23/20 1211  NA 138 139 139  K 4.3 4.3 4.2  CL 94* 95* 97  CO2 34* 31 39*  GLUCOSE 253* 168* 211*  BUN 42* 52* 40*  CREATININE 1.70* 1.87* 1.63*  CALCIUM 9.2 9.0 9.6  TSH 7.59* 3.91  --    Liver Function Tests: Recent Labs    11/07/19 0926  AST 16  ALT 15  BILITOT 0.7  PROT 7.2   CBC: Recent Labs    11/07/19 0926 02/03/20 0906  05/23/20 1211  WBC 5.6 6.4 5.3  NEUTROABS 3,982 4,512 4.0  HGB 9.7* 10.3* 10.5*  HCT 30.6* 33.0* 32.1*  MCV 88.2 87.3 93.2  PLT 169 162 136.0*   Lipid Panel: Recent Labs    11/07/19 0926  CHOL 156  HDL 73  LDLCALC 61  TRIG 138  CHOLHDL 2.1   Lab Results  Component Value Date   HGBA1C 7.2 (H) 02/03/2020     Assessment/Plan  1. Chronic diastolic heart failure (HCC) - Lasix and Potassium dosage will be increased for 2 weeks - furosemide (LASIX) 40 MG tablet; Take 2 tablets (80 mg total) by mouth daily. Take 2 tabs = 80 mg daily and 1 tab = 40 mg at lunch daily  Dispense: 30 tablet; Refill: 0 - potassium gluconate 595 (99 K) MG TABS tablet; Take 1 tablet (595 mg total) by mouth daily. Take 2 tabs twice a day  Dispense: 60 tablet; Refill: 0 - BMP with eGFR([facility]) in 2 weeks  2. Chronic obstructive pulmonary disease, unspecified COPD type ([place]) -  Continue PRN Proventil, Mucinex and Trelegy Ellipta -  O2 @ 2L/min via [place]  3.  Bilateral lower extremity edema -  Lasix increased today to 80 mg Q AM and 40 mg at lunch, discussed with her that once her edema improves then she might go back to her usual dosage of Lasix and Potassium - instructed to monitor skin for increase in temperature or any drainage and to keep skin  clean and dry    Labs/tests ordered:  BMP in 2 weeks  Next appt:  Follow up in 2 weeks

## 2020-09-03 ENCOUNTER — Other Ambulatory Visit: Payer: Self-pay | Admitting: *Deleted

## 2020-09-03 ENCOUNTER — Other Ambulatory Visit: Payer: Self-pay | Admitting: Nurse Practitioner

## 2020-09-03 DIAGNOSIS — I5032 Chronic diastolic (congestive) heart failure: Secondary | ICD-10-CM

## 2020-09-03 MED ORDER — FUROSEMIDE 40 MG PO TABS: ORAL_TABLET | ORAL | 0 refills | Status: DC

## 2020-09-03 NOTE — Telephone Encounter (Signed)
Patient called requesting a refill. Stated that she saw [facility] on 11/4 and her Lasix was increased.  Pended Rx and sent to [facility] for approval.    OV Note Dated 08/30/20: 3.  Bilateral lower extremity edema -  Lasix increased today to 80 mg Q AM and 40 mg at lunch, discussed with her that once her edema improves then she might go back to her usual dosage of Lasix

## 2020-09-03 NOTE — Telephone Encounter (Signed)
Please have her make follow up appt in 2 weeks to follow up swelling

## 2020-09-04 NOTE — Telephone Encounter (Signed)
Patient has an appointment 09/17/2020 to follow up

## 2020-09-12 ENCOUNTER — Other Ambulatory Visit: Payer: Self-pay | Admitting: Nurse Practitioner

## 2020-09-12 DIAGNOSIS — I10 Essential (primary) hypertension: Secondary | ICD-10-CM

## 2020-09-14 ENCOUNTER — Encounter (HOSPITAL_COMMUNITY): Payer: Medicare Other

## 2020-09-17 ENCOUNTER — Other Ambulatory Visit: Payer: Self-pay

## 2020-09-17 ENCOUNTER — Ambulatory Visit (INDEPENDENT_AMBULATORY_CARE_PROVIDER_SITE_OTHER): Payer: Medicare Other | Admitting: Nurse Practitioner

## 2020-09-17 ENCOUNTER — Encounter: Payer: Self-pay | Admitting: Nurse Practitioner

## 2020-09-17 VITALS — BP 150/78 | HR 74 | Temp 97.1°F | Ht 63.0 in | Wt 209.0 lb

## 2020-09-17 DIAGNOSIS — R600 Localized edema: Secondary | ICD-10-CM | POA: Diagnosis not present

## 2020-09-17 DIAGNOSIS — D509 Iron deficiency anemia, unspecified: Secondary | ICD-10-CM | POA: Diagnosis not present

## 2020-09-17 DIAGNOSIS — E1165 Type 2 diabetes mellitus with hyperglycemia: Secondary | ICD-10-CM

## 2020-09-17 DIAGNOSIS — H6121 Impacted cerumen, right ear: Secondary | ICD-10-CM | POA: Diagnosis not present

## 2020-09-17 DIAGNOSIS — J449 Chronic obstructive pulmonary disease, unspecified: Secondary | ICD-10-CM

## 2020-09-17 DIAGNOSIS — I5032 Chronic diastolic (congestive) heart failure: Secondary | ICD-10-CM | POA: Diagnosis not present

## 2020-09-17 DIAGNOSIS — Z23 Encounter for immunization: Secondary | ICD-10-CM

## 2020-09-17 MED ORDER — FUROSEMIDE 40 MG PO TABS: ORAL_TABLET | ORAL | 1 refills | Status: AC

## 2020-09-17 NOTE — Progress Notes (Addendum)
Careteam: Patient Care Team: [name], NP as PCP - General (Geriatric Medicine) Martinique, [name] M, MD as PCP - Cardiology (Cardiology) [name], MD as Consulting Physician (Orthopedic Surgery) Druscilla Brownie, MD as Consulting Physician (Dermatology) Ralene Bathe, MD as Consulting Physician (Ophthalmology)  PLACE OF SERVICE:  [place]  Advanced Directive information    Allergies  Allergen Reactions  . Codeine Other (See Comments)    "Spaces out" the patient and she cannot walk well  . Vesicare [Solifenacin] Itching    Chief Complaint  Patient presents with  . Follow-up    2 week follow-up on chronic diastolic heart failure, swelling, recheck BMP. Need clarification on how to take potassium.   . Cough    Patient c/o hacking cough since last night.      HPI: Patient is a 84 y.o. female to follow up on heart failure. She was seen in office due to worsening bilateral LE and lasix was increased due to progressive heart failure.  She has been taking lasix 80 mg daily in the am and 40 mg at lunch with increase in potassium to 1 tablet twice daily  She thought she was supposed to increase lasix for only a week.  She now takes lasix 80 mg daily and on Monday, Wednesday, Friday takes 40 mg daily  She had increased lasix to 80 mg daily and took 40 mg daily for 1 week.  She has not changed her potassium doses.   Overall swelling better in legs, in the morning it has resolved but worse in the afternoon.   No hypoglycemia  Review of Systems:  Review of Systems  Constitutional: Negative for chills, fever and weight loss.  HENT: Negative for tinnitus.   Respiratory: Positive for cough and shortness of breath. Negative for sputum production.        Cough and shortness of breath at baseline.   Cardiovascular: Positive for leg swelling (improved). Negative for chest pain and palpitations.  Gastrointestinal: Negative for abdominal pain, constipation, diarrhea  and heartburn.  Genitourinary: Negative for dysuria, frequency and urgency.  Musculoskeletal: Negative for back pain, falls, joint pain and myalgias.  Skin: Negative.   Neurological: Negative for dizziness and headaches.  Psychiatric/Behavioral: Negative for depression and memory loss. The patient does not have insomnia.     Past Medical History:  Diagnosis Date  . Allergy   . Anemia, unspecified   . Arthritis   . Asthma   . Atrial fibrillation status post cardioversion [facility]) 09/2015   s/p TEE/DCCV>>SR on amio  . Benign neoplasm of colon   . Carpal tunnel syndrome   . Cellulitis and abscess of finger, unspecified   . Cerumen impaction   . Cervicalgia   . CHF (congestive heart failure) (Uncertain)   . Chronic airway obstruction, not elsewhere classified   . Chronic anticoagulation 09/2015   Eliquis  . Diarrhea   . Diffuse cystic mastopathy   . Edema   . Encounter for long-term (current) use of other medications   . GERD (gastroesophageal reflux disease)   . Heart murmur   . Lumbago   . Neck pain 05/23/2015  . Obesity, unspecified   . Other and unspecified hyperlipidemia   . Other psoriasis   . Pain in joint, lower leg   . PONV (postoperative nausea and vomiting)   . Primary localized osteoarthrosis of right shoulder 12/18/2015  . 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 Scoliosis (and kyphoscoliosis), idiopathic   . Shortness of breath dyspnea    with exertion  . Type II or unspecified type diabetes mellitus without mention of complication, uncontrolled   . Unspecified essential hypertension   . Unspecified hemorrhoids without mention of complication   . Unspecified hypothyroidism   . Urge incontinence    Past Surgical History:  Procedure Laterality Date  . ABDOMINAL HYSTERECTOMY  1987   complete  . BREAST BIOPSY Right   . BREAST EXCISIONAL BIOPSY  Left 2011  . BREAST EXCISIONAL BIOPSY Right    x2  . BREAST SURGERY     right breast 3 surgeries [phone]  . CARDIAC CATHETERIZATION N/A 09/02/2016   Procedure: Right/Left Heart Cath and Coronary Angiography;  Surgeon: [name] M Martinique, MD;  Location: [place] CV LAB;  Service: Cardiovascular;  Laterality: N/A;  . CARDIOVERSION N/A 10/19/2015   Procedure: CARDIOVERSION;  Surgeon: Lelon Perla, MD;  Location: [facility] ENDOSCOPY;  Service: Cardiovascular;  Laterality: N/A;  . CARDIOVERSION N/A 11/13/2017   Procedure: CARDIOVERSION;  Surgeon: Jerline Pain, MD;  Location: [facility] ENDOSCOPY;  Service: Cardiovascular;  Laterality: N/A;  . CARDIOVERSION N/A 07/28/2019   Procedure: CARDIOVERSION;  Surgeon: Buford Dresser, MD;  Location: [place];  Service: Cardiovascular;  Laterality: N/A;  . [place]  . COLON SURGERY     2004,2007,2013.  3 times colon surgieres  . ESOPHAGOGASTRODUODENOSCOPY (EGD) WITH PROPOFOL Left 03/13/2017   Procedure: ESOPHAGOGASTRODUODENOSCOPY (EGD) WITH PROPOFOL;  Surgeon: Ronnette Juniper, MD;  Location: [place];  Service: Gastroenterology;  Laterality: Left;  . ESOPHAGOGASTRODUODENOSCOPY (EGD) WITH PROPOFOL Left 01/22/2018   Procedure: ESOPHAGOGASTRODUODENOSCOPY (EGD) WITH PROPOFOL;  Surgeon: Arta Silence, MD;  Location: [facility] ENDOSCOPY;  Service: Endoscopy;  Laterality: Left;  . EXCISE LE MANDIBULAR LYMPH NODE T     DR C. NEWMAN  . FOOT SURGERY  1989  . GIVENS CAPSULE STUDY Left 01/23/2018   Procedure: GIVENS CAPSULE STUDY;  Surgeon: Wilford Corner, MD;  Location: [facility] ENDOSCOPY;  Service: Endoscopy;  Laterality: Left;  . LEFT SHOULDER ARTHROSCOPY    . 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 NEUROPLASTY / TRANSPOSITION MEDIAN NERVE AT CARPAL TUNNEL BILATERAL  05/2003  . TEE WITHOUT CARDIOVERSION N/A 10/19/2015   Procedure: Transesophageal Echocardiogram (TEE) ;  Surgeon: Lelon Perla, MD;  Location: [facility] ENDOSCOPY;  Service:  Cardiovascular;  Laterality: N/A;  . TEE WITHOUT CARDIOVERSION N/A 10/14/2016   Procedure: TRANSESOPHAGEAL ECHOCARDIOGRAM (TEE);  Surgeon: Sherren Mocha, MD;  Location: [place];  Service: Open Heart Surgery;  Laterality: N/A;  . TEE WITHOUT CARDIOVERSION N/A 07/28/2019   Procedure: TRANSESOPHAGEAL ECHOCARDIOGRAM (TEE);  Surgeon: Buford Dresser, MD;  Location: [facility] ENDOSCOPY;  Service: Cardiovascular;  Laterality: N/A;  . TOTAL SHOULDER ARTHROPLASTY Right 12/18/2015   Procedure: TOTAL SHOULDER ARTHROPLASTY;  Surgeon: [name], MD;  Location: [place];  Service: Orthopedics;  Laterality: Right;  . TRANSCATHETER AORTIC VALVE REPLACEMENT, TRANSFEMORAL N/A 10/14/2016   Procedure: TRANSCATHETER AORTIC VALVE REPLACEMENT, TRANSFEMORAL;  Surgeon: Sherren Mocha, MD;  Location: [place];  Service: Open Heart Surgery;  Laterality: N/A;  . TRIGGER FINGER RELEASE Left   . VAGINAL CYST REMOVED  1967   Social History:   reports that she quit smoking about 31 years ago. She has a 40.00 pack-year smoking history. She has never used smokeless tobacco. She reports that she does not drink alcohol and does not use drugs.  Family History  Problem Relation Age of Onset  .  Diabetes Father   . Stroke Father   . Heart disease Father   . Liver cancer Sister   . Heart disease Brother   . Asthma Sister   . Arthritis Sister   . Breast cancer Neg Hx     Medications: Patient's Medications  New Prescriptions   No medications on file  Previous Medications   ACETAMINOPHEN (TYLENOL) 650 MG CR TABLET    Take 1,300 mg by mouth every 8 (eight) hours as needed for pain.   ALBUTEROL (PROVENTIL) (2.5 MG/3ML) 0.083% NEBULIZER SOLUTION    USE 1 VIAL IN NEBULIZER EVERY 4 HOURS AND AS NEEDED. Generic: VENTOLIN   ALBUTEROL (VENTOLIN HFA) 108 (90 BASE) MCG/ACT INHALER    Inhale 2 puffs into the lungs every 6 (six) hours as needed for wheezing or shortness of breath.   AMIODARONE (PACERONE) 200 MG TABLET    Take 1 tablet (200  mg total) by mouth daily.   AZELASTINE (ASTELIN) 0.1 % NASAL SPRAY    Place 1 spray into both nostrils 2 (two) times daily. Use in each nostril as directed   BLOOD GLUCOSE MONITORING SUPPL (ONETOUCH VERIO) W/DEVICE KIT    Check blood sugar twice daily DX:E11.9   CETIRIZINE (ZYRTEC) 10 MG TABLET    Take 10 mg by mouth daily as needed for allergies.    CHOLECALCIFEROL (VITAMIN D) 1000 UNITS TABLET    Take 1,000 Units by mouth at bedtime.    COLESTIPOL (COLESTID) 1 G TABLET    Take 1 g by mouth 2 (two) times daily.   DEXTROMETHORPHAN-GUAIFENESIN (MUCINEX DM) 30-600 MG TB12    Take 2 tablets by mouth as needed.   DOXAZOSIN (CARDURA) 4 MG TABLET    TAKE 1 TABLET BY MOUTH EVERY DAY TO HELP CONTROL BLOOD PRESSURE   FERROUS SULFATE 325 (65 FE) MG EC TABLET    Take 325 mg by mouth 2 (two) times daily.   FLUTICASONE-UMECLIDIN-VILANT (TRELEGY ELLIPTA) 200-62.5-25 MCG/INH AEPB    Inhale 1 puff into the lungs daily.   FUROSEMIDE (LASIX) 40 MG TABLET    TAKE 2 TABLETS BY MOUTH DAILY AND 1 TABLET BY MOUTH AT LUNCH DAILY   GLIMEPIRIDE (AMARYL) 1 MG TABLET    TAKE 1 TABLET(1 MG) BY MOUTH DAILY WITH BREAKFAST   GLUCOSE BLOOD TEST STRIP    1 each by Other route 2 (two) times daily. OneTouch Verio DX E11.8   JANUVIA 50 MG TABLET    TAKE 1 TABLET(50 MG) BY MOUTH DAILY   LANCETS (ONETOUCH ULTRASOFT) LANCETS    1 each by Other route 2 (two) times daily. OneTouch Verio DX E11.9   LEVOTHYROXINE (SYNTHROID) 88 MCG TABLET    TAKE 1 TABLET BY MOUTH DAILY ON AN EMPTY STOMACH   OXYGEN    Inhale 2 L into the lungs continuous. Pt also uses 4L pulse via POC   PANTOPRAZOLE (PROTONIX) 40 MG TABLET    TAKE 1 TABLET(40 MG) BY MOUTH TWICE DAILY   POTASSIUM GLUCONATE 595 (99 K) MG TABS TABLET    Take 595 mg by mouth 2 (two) times daily.   ROSUVASTATIN (CRESTOR) 10 MG TABLET    TAKE 1 TABLET(10 MG) BY MOUTH DAILY  Modified Medications   No medications on file  Discontinued Medications   POTASSIUM GLUCONATE 595 (99 K) MG TABS TABLET     Take 1 tablet (595 mg total) by mouth daily. Take 2 tabs twice a day    Physical Exam:  Vitals:   09/17/20 1413  BP: Marland Kitchen)  150/78  Pulse: 74  Temp: (!) 97.1 F (36.2 C)  TempSrc: Temporal  SpO2: 94%  Weight: 209 lb (94.8 kg)  Height: _0  (1.6 m)   Body mass index is 37.02 kg/m. Wt Readings from Last 3 Encounters:  09/17/20 209 lb (94.8 kg)  08/30/20 217 lb 3.2 oz (98.5 kg)  06/28/20 216 lb (98 kg)    Physical Exam Constitutional:      General: She is not in acute distress.    Appearance: She is well-developed. She is not diaphoretic.  HENT:     Head: Normocephalic and atraumatic.     Right Ear: There is impacted cerumen.  Eyes:     Conjunctiva/sclera: Conjunctivae normal.     Pupils: Pupils are equal, round, and reactive to light.  Cardiovascular:     Rate and Rhythm: Normal rate and regular rhythm.     Heart sounds: Normal heart sounds.  Pulmonary:     Effort: Pulmonary effort is normal.     Breath sounds: Normal breath sounds.     Comments: On O2 Abdominal:     General: Bowel sounds are normal.     Palpations: Abdomen is soft.  Musculoskeletal:        General: No tenderness.     Cervical back: Normal range of motion and neck supple.     Right lower leg: Edema (1+) present.     Left lower leg: Edema (1+) present.  Skin:    General: Skin is warm and dry.  Neurological:     Mental Status: She is alert and oriented to person, place, and time.  Psychiatric:        Mood and Affect: Mood normal.        Behavior: Behavior normal.     Labs reviewed: Basic Metabolic Panel: Recent Labs    11/07/19 0926 02/03/20 0906 05/23/20 1211  NA 138 139 139  K 4.3 4.3 4.2  CL 94* 95* 97  CO2 34* 31 39*  GLUCOSE 253* 168* 211*  BUN 42* 52* 40*  CREATININE 1.70* 1.87* 1.63*  CALCIUM 9.2 9.0 9.6  TSH 7.59* 3.91  --    Liver Function Tests: Recent Labs    11/07/19 0926  AST 16  ALT 15  BILITOT 0.7  PROT 7.2   No results for input(s): LIPASE, AMYLASE in  the last 8760 hours. No results for input(s): AMMONIA in the last 8760 hours. CBC: Recent Labs    11/07/19 0926 02/03/20 0906 05/23/20 1211  WBC 5.6 6.4 5.3  NEUTROABS 3,982 4,512 4.0  HGB 9.7* 10.3* 10.5*  HCT 30.6* 33.0* 32.1*  MCV 88.2 87.3 93.2  PLT 169 162 136.0*   Lipid Panel: Recent Labs    11/07/19 0926  CHOL 156  HDL 73  LDLCALC 61  TRIG 138  CHOLHDL 2.1   TSH: Recent Labs    11/07/19 0926 02/03/20 0906  TSH 7.59* 3.91   A1C: Lab Results  Component Value Date   HGBA1C 7.2 (H) 02/03/2020     Assessment/Plan 1. Bilateral lower extremity edema Doing better at this time. Unable to wear compression hose but worse LE edema in the evening that resolves after being in the bed at night.  Ideally to use compression hose/socks-apply in the morning and remove at bedtime Elevate legs as much as possible  "toes above nose" for 45 min to an hour a day - BMP with eGFR(Quest) - Brain Natriuretic Peptide  2. Chronic diastolic heart failure (HCC) -stable edema, with ongoing  shortness of breath and cough but seems to be at baseline. Continues on lasix and potassium supplement. Encouraged low sodium diet.  - BMP with eGFR(Quest) - Brain Natriuretic Peptide - furosemide (LASIX) 40 MG tablet; TAKE 2 TABLETS BY MOUTH DAILY AND 1 TABLET BY MOUTH AT LUNCH on  Mondays, Wednesdays and Fridays  Dispense: 270 tablet; Refill: 1  3. Iron deficiency anemia, unspecified iron deficiency anemia type -continues on iron supplement, no signs of bruising or bleeding.  - CBC with Differential/Platelet - Iron, TIBC and Ferritin Panel  4. Chronic obstructive pulmonary disease, unspecified COPD type ([place]) Stable, continues on O2 with trelegy and albuterol PRN Continues mucinex DM by mouth twice daily as needed cough and congestion   5. Type 2 diabetes mellitus with hyperglycemia, without long-term current use of insulin (HCC) -Encouraged dietary compliance, routine foot care/monitoring  and to keep up with diabetic eye exams through ophthalmology  -continues on amaryl and januvia  - Hemoglobin A1c - Flu Vaccine QUAD High Dose(Fluad)  6. Need for influenza vaccination - Flu Vaccine QUAD High Dose(Fluad)  7. Right cerumen impaction Removed via ear lavage, pt tolerated well Next appt: 3 months for routine follow up  [place]. [place], [facility] [phone]

## 2020-09-18 LAB — HEMOGLOBIN A1C
Hgb A1c MFr Bld: 7.3 % of total Hgb — ABNORMAL HIGH (ref <5.7)
Mean Plasma Glucose: 163 (calc)
eAG (mmol/L): 9 (calc)

## 2020-09-18 LAB — BASIC METABOLIC PANEL WITH GFR
BUN/Creatinine Ratio: 21 (calc) (ref 6–22)
BUN: 39 mg/dL — ABNORMAL HIGH (ref 7–25)
CO2: 32 mmol/L (ref 20–32)
Calcium: 9.3 mg/dL (ref 8.6–10.4)
Chloride: 97 mmol/L — ABNORMAL LOW (ref 98–110)
Creat: 1.88 mg/dL — ABNORMAL HIGH (ref 0.60–0.88)
GFR, Est African American: 28 mL/min/{1.73_m2} — ABNORMAL LOW (ref >=60)
GFR, Est Non African American: 24 mL/min/{1.73_m2} — ABNORMAL LOW (ref >=60)
Glucose, Bld: 184 mg/dL — ABNORMAL HIGH (ref 65–139)
Potassium: 4.3 mmol/L (ref 3.5–5.3)
Sodium: 142 mmol/L (ref 135–146)

## 2020-09-18 LAB — CBC WITH DIFFERENTIAL/PLATELET
Absolute Monocytes: 538 cells/uL (ref 200–950)
Basophils Absolute: 62 cells/uL (ref 0–200)
Basophils Relative: 0.9 %
Eosinophils Absolute: 131 cells/uL (ref 15–500)
Eosinophils Relative: 1.9 %
HCT: 31.1 % — ABNORMAL LOW (ref 35.0–45.0)
Hemoglobin: 10.1 g/dL — ABNORMAL LOW (ref 11.7–15.5)
Lymphs Abs: 593 cells/uL — ABNORMAL LOW (ref 850–3900)
MCH: 29 pg (ref 27.0–33.0)
MCHC: 32.5 g/dL (ref 32.0–36.0)
MCV: 89.4 fL (ref 80.0–100.0)
MPV: 11.4 fL (ref 7.5–12.5)
Monocytes Relative: 7.8 %
Neutro Abs: 5575 cells/uL (ref 1500–7800)
Neutrophils Relative %: 80.8 %
Platelets: 203 10*3/uL (ref 140–400)
RBC: 3.48 10*6/uL — ABNORMAL LOW (ref 3.80–5.10)
RDW: 14.2 % (ref 11.0–15.0)
Total Lymphocyte: 8.6 %
WBC: 6.9 10*3/uL (ref 3.8–10.8)

## 2020-09-18 LAB — IRON,TIBC AND FERRITIN PANEL
%SAT: 12 % (calc) — ABNORMAL LOW (ref 16–45)
Ferritin: 488 ng/mL — ABNORMAL HIGH (ref 16–288)
Iron: 32 ug/dL — ABNORMAL LOW (ref 45–160)
TIBC: 277 mcg/dL (calc) (ref 250–450)

## 2020-09-18 LAB — BRAIN NATRIURETIC PEPTIDE: Brain Natriuretic Peptide: 572 pg/mL — ABNORMAL HIGH (ref <100)

## 2020-09-29 DIAGNOSIS — J449 Chronic obstructive pulmonary disease, unspecified: Secondary | ICD-10-CM | POA: Diagnosis not present

## 2020-10-01 ENCOUNTER — Other Ambulatory Visit (HOSPITAL_COMMUNITY): Payer: Self-pay | Admitting: *Deleted

## 2020-10-02 ENCOUNTER — Inpatient Hospital Stay (HOSPITAL_COMMUNITY): Admission: RE | Admit: 2020-10-02 | Payer: Medicare Other | Source: Ambulatory Visit

## 2020-10-03 ENCOUNTER — Other Ambulatory Visit: Payer: Self-pay | Admitting: Gastroenterology

## 2020-10-03 DIAGNOSIS — K7460 Unspecified cirrhosis of liver: Secondary | ICD-10-CM

## 2020-10-03 DIAGNOSIS — N2889 Other specified disorders of kidney and ureter: Secondary | ICD-10-CM

## 2020-10-10 ENCOUNTER — Other Ambulatory Visit: Payer: Self-pay | Admitting: Nurse Practitioner

## 2020-10-10 DIAGNOSIS — E1165 Type 2 diabetes mellitus with hyperglycemia: Secondary | ICD-10-CM

## 2020-10-10 NOTE — Telephone Encounter (Signed)
Patient is due for refills on medications "Januvia" and "Albuterol". I tired to send in medications but warnings appeared. Medication pend and sent to PCP [name], NP . Please Advise.

## 2020-10-14 ENCOUNTER — Other Ambulatory Visit: Payer: Self-pay | Admitting: Primary Care

## 2020-10-23 ENCOUNTER — Ambulatory Visit
Admission: RE | Admit: 2020-10-23 | Discharge: 2020-10-23 | Disposition: A | Discharge status: Home / Self Care | Payer: Medicare Other | Source: Ambulatory Visit | Attending: Gastroenterology | Admitting: Gastroenterology

## 2020-10-23 DIAGNOSIS — K7460 Unspecified cirrhosis of liver: Secondary | ICD-10-CM | POA: Diagnosis not present

## 2020-10-23 DIAGNOSIS — N2889 Other specified disorders of kidney and ureter: Secondary | ICD-10-CM | POA: Diagnosis not present

## 2020-10-23 DIAGNOSIS — K760 Fatty (change of) liver, not elsewhere classified: Secondary | ICD-10-CM | POA: Diagnosis not present

## 2020-10-23 DIAGNOSIS — I700 Atherosclerosis of aorta: Secondary | ICD-10-CM | POA: Diagnosis not present

## 2020-10-30 ENCOUNTER — Other Ambulatory Visit: Payer: Self-pay | Admitting: Nurse Practitioner

## 2020-10-30 DIAGNOSIS — E785 Hyperlipidemia, unspecified: Secondary | ICD-10-CM

## 2020-10-30 DIAGNOSIS — J449 Chronic obstructive pulmonary disease, unspecified: Secondary | ICD-10-CM | POA: Diagnosis not present

## 2020-10-31 DIAGNOSIS — D4101 Neoplasm of uncertain behavior of right kidney: Secondary | ICD-10-CM | POA: Diagnosis not present

## 2020-11-18 ENCOUNTER — Other Ambulatory Visit: Payer: Self-pay | Admitting: Nurse Practitioner

## 2020-11-18 DIAGNOSIS — E1165 Type 2 diabetes mellitus with hyperglycemia: Secondary | ICD-10-CM

## 2020-11-30 DIAGNOSIS — J449 Chronic obstructive pulmonary disease, unspecified: Secondary | ICD-10-CM | POA: Diagnosis not present

## 2020-12-16 ENCOUNTER — Other Ambulatory Visit: Payer: Self-pay

## 2020-12-16 ENCOUNTER — Emergency Department (HOSPITAL_COMMUNITY)
Admission: EM | Admit: 2020-12-16 | Discharge: 2020-12-17 | Disposition: A | Discharge status: Home / Self Care | Payer: Medicare Other | Attending: Emergency Medicine | Admitting: Emergency Medicine

## 2020-12-16 DIAGNOSIS — R6889 Other general symptoms and signs: Secondary | ICD-10-CM | POA: Diagnosis not present

## 2020-12-16 DIAGNOSIS — Z79899 Other long term (current) drug therapy: Secondary | ICD-10-CM | POA: Insufficient documentation

## 2020-12-16 DIAGNOSIS — I110 Hypertensive heart disease with heart failure: Secondary | ICD-10-CM | POA: Diagnosis not present

## 2020-12-16 DIAGNOSIS — Z7951 Long term (current) use of inhaled steroids: Secondary | ICD-10-CM | POA: Insufficient documentation

## 2020-12-16 DIAGNOSIS — E119 Type 2 diabetes mellitus without complications: Secondary | ICD-10-CM | POA: Diagnosis not present

## 2020-12-16 DIAGNOSIS — E1165 Type 2 diabetes mellitus with hyperglycemia: Secondary | ICD-10-CM | POA: Diagnosis not present

## 2020-12-16 DIAGNOSIS — J45909 Unspecified asthma, uncomplicated: Secondary | ICD-10-CM | POA: Diagnosis not present

## 2020-12-16 DIAGNOSIS — Z87891 Personal history of nicotine dependence: Secondary | ICD-10-CM | POA: Diagnosis not present

## 2020-12-16 DIAGNOSIS — Y92009 Unspecified place in unspecified non-institutional (private) residence as the place of occurrence of the external cause: Secondary | ICD-10-CM | POA: Diagnosis not present

## 2020-12-16 DIAGNOSIS — Z7984 Long term (current) use of oral hypoglycemic drugs: Secondary | ICD-10-CM | POA: Diagnosis not present

## 2020-12-16 DIAGNOSIS — Z7901 Long term (current) use of anticoagulants: Secondary | ICD-10-CM | POA: Insufficient documentation

## 2020-12-16 DIAGNOSIS — Z96612 Presence of left artificial shoulder joint: Secondary | ICD-10-CM | POA: Diagnosis not present

## 2020-12-16 DIAGNOSIS — S4992XA Unspecified injury of left shoulder and upper arm, initial encounter: Secondary | ICD-10-CM

## 2020-12-16 DIAGNOSIS — J90 Pleural effusion, not elsewhere classified: Secondary | ICD-10-CM | POA: Diagnosis not present

## 2020-12-16 DIAGNOSIS — J441 Chronic obstructive pulmonary disease with (acute) exacerbation: Secondary | ICD-10-CM | POA: Insufficient documentation

## 2020-12-16 DIAGNOSIS — I499 Cardiac arrhythmia, unspecified: Secondary | ICD-10-CM | POA: Diagnosis not present

## 2020-12-16 DIAGNOSIS — W19XXXA Unspecified fall, initial encounter: Secondary | ICD-10-CM | POA: Diagnosis not present

## 2020-12-16 DIAGNOSIS — I509 Heart failure, unspecified: Secondary | ICD-10-CM | POA: Insufficient documentation

## 2020-12-16 DIAGNOSIS — E039 Hypothyroidism, unspecified: Secondary | ICD-10-CM | POA: Diagnosis not present

## 2020-12-16 DIAGNOSIS — Z043 Encounter for examination and observation following other accident: Secondary | ICD-10-CM | POA: Diagnosis not present

## 2020-12-16 DIAGNOSIS — Z743 Need for continuous supervision: Secondary | ICD-10-CM | POA: Diagnosis not present

## 2020-12-16 DIAGNOSIS — R0602 Shortness of breath: Secondary | ICD-10-CM | POA: Diagnosis not present

## 2020-12-17 ENCOUNTER — Ambulatory Visit: Payer: Medicare Other | Admitting: Nurse Practitioner

## 2020-12-17 ENCOUNTER — Emergency Department (HOSPITAL_COMMUNITY): Payer: Medicare Other

## 2020-12-17 DIAGNOSIS — J90 Pleural effusion, not elsewhere classified: Secondary | ICD-10-CM | POA: Diagnosis not present

## 2020-12-17 DIAGNOSIS — Z043 Encounter for examination and observation following other accident: Secondary | ICD-10-CM | POA: Diagnosis not present

## 2020-12-17 LAB — URINALYSIS, ROUTINE W REFLEX MICROSCOPIC
Bilirubin Urine: NEGATIVE
Glucose, UA: NEGATIVE mg/dL
Hgb urine dipstick: NEGATIVE
Ketones, ur: NEGATIVE mg/dL
Leukocytes,Ua: NEGATIVE
Nitrite: NEGATIVE
Protein, ur: 30 mg/dL — AB
Specific Gravity, Urine: 1.011 (ref 1.005–1.030)
pH: 6 (ref 5.0–8.0)

## 2020-12-17 LAB — BASIC METABOLIC PANEL
Anion gap: 12 (ref 5–15)
BUN: 52 mg/dL — ABNORMAL HIGH (ref 8–23)
CO2: 32 mmol/L (ref 22–32)
Calcium: 9.1 mg/dL (ref 8.9–10.3)
Chloride: 95 mmol/L — ABNORMAL LOW (ref 98–111)
Creatinine, Ser: 2.02 mg/dL — ABNORMAL HIGH (ref 0.44–1.00)
GFR, Estimated: 24 mL/min — ABNORMAL LOW (ref >60)
Glucose, Bld: 216 mg/dL — ABNORMAL HIGH (ref 70–99)
Potassium: 4.3 mmol/L (ref 3.5–5.1)
Sodium: 139 mmol/L (ref 135–145)

## 2020-12-17 LAB — CBC WITH DIFFERENTIAL/PLATELET
Abs Immature Granulocytes: 0.07 10*3/uL (ref 0.00–0.07)
Basophils Absolute: 0 10*3/uL (ref 0.0–0.1)
Basophils Relative: 1 %
Eosinophils Absolute: 0 10*3/uL (ref 0.0–0.5)
Eosinophils Relative: 0 %
HCT: 37.2 % (ref 36.0–46.0)
Hemoglobin: 11 g/dL — ABNORMAL LOW (ref 12.0–15.0)
Immature Granulocytes: 1 %
Lymphocytes Relative: 8 %
Lymphs Abs: 0.6 10*3/uL — ABNORMAL LOW (ref 0.7–4.0)
MCH: 28.3 pg (ref 26.0–34.0)
MCHC: 29.6 g/dL — ABNORMAL LOW (ref 30.0–36.0)
MCV: 95.6 fL (ref 80.0–100.0)
Monocytes Absolute: 0.5 10*3/uL (ref 0.1–1.0)
Monocytes Relative: 6 %
Neutro Abs: 7 10*3/uL (ref 1.7–7.7)
Neutrophils Relative %: 84 %
Platelets: 139 10*3/uL — ABNORMAL LOW (ref 150–400)
RBC: 3.89 MIL/uL (ref 3.87–5.11)
RDW: 16.5 % — ABNORMAL HIGH (ref 11.5–15.5)
WBC: 8.2 10*3/uL (ref 4.0–10.5)
nRBC: 0 % (ref 0.0–0.2)

## 2020-12-17 LAB — BRAIN NATRIURETIC PEPTIDE: B Natriuretic Peptide: 574.3 pg/mL — ABNORMAL HIGH (ref 0.0–100.0)

## 2020-12-17 MED ORDER — FUROSEMIDE 10 MG/ML IJ SOLN
40.0000 mg | Freq: Once | INTRAMUSCULAR | Status: AC
  Administered 2020-12-17: 40 mg via INTRAVENOUS
  Filled 2020-12-17: qty 4

## 2020-12-17 MED ORDER — IPRATROPIUM-ALBUTEROL 0.5-2.5 (3) MG/3ML IN SOLN
3.0000 mL | Freq: Once | RESPIRATORY_TRACT | Status: AC
  Administered 2020-12-17: 3 mL via RESPIRATORY_TRACT
  Filled 2020-12-17: qty 3

## 2020-12-17 NOTE — ED Provider Notes (Signed)
[facility] DEPT Provider Note: Georgena Spurling, MD, FACEP  CSN: [id number] MRN: [id number] ARRIVAL: 12/16/20 at 2345 ROOM: [place]  Fall   HISTORY OF PRESENT ILLNESS  12/17/20 12:48 AM Patricia [name] is a 85 y.o. female with COPD. She lost her balance and fell about 7 PM. She was unable to get up and laid on the ground for about 2 hours until she was able to summon help. During that time she did not have access to her oxygen; she is normally on 2 L by nasal cannula at baseline. Upon EMS arrival her oxygen saturation was 50% on room air and wheezing was noticed. She was placed on a nonrebreather and given 1 DuoNeb and 2 g of magnesium sulfate IV. Her breathing has improved significantly. She is having pain in her left shoulder as a result of the fall. She rates the pain is a 4 out of 10, worse with movement. She is able to move the left shoulder. She did not hit her head and did not have a loss of consciousness. She is not having chest pain.   Past Medical History:  Diagnosis Date  . Allergy   . Anemia, unspecified   . Arthritis   . Asthma   . Atrial fibrillation status post cardioversion [facility]) 09/2015   s/p TEE/DCCV>>SR on amio  . Benign neoplasm of colon   . Carpal tunnel syndrome   . Cellulitis and abscess of finger, unspecified   . Cerumen impaction   . Cervicalgia   . CHF (congestive heart failure) ([place])   . Chronic airway obstruction, not elsewhere classified   . Chronic anticoagulation 09/2015   Eliquis  . Diarrhea   . Diffuse cystic mastopathy   . Edema   . Encounter for long-term (current) use of other medications   . GERD (gastroesophageal reflux disease)   . Heart murmur   . Lumbago   . Neck pain 05/23/2015  . Obesity, unspecified   . Other and unspecified hyperlipidemia   . Other psoriasis   . Pain in joint, lower leg   . PONV (postoperative nausea and vomiting)   . Primary localized osteoarthrosis of right shoulder 12/18/2015  . 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 Scoliosis (and kyphoscoliosis), idiopathic   . Shortness of breath dyspnea    with exertion  . Type II or unspecified type diabetes mellitus without mention of complication, uncontrolled   . Unspecified essential hypertension   . Unspecified hemorrhoids without mention of complication   . Unspecified hypothyroidism   . Urge incontinence     Past Surgical History:  Procedure Laterality Date  . ABDOMINAL HYSTERECTOMY  1987   complete  . BREAST BIOPSY Right   . BREAST EXCISIONAL BIOPSY Left 2011  . BREAST EXCISIONAL BIOPSY Right    x2  . BREAST SURGERY     right breast 3 surgeries [phone]  . CARDIAC CATHETERIZATION N/A 09/02/2016   Procedure: Right/Left Heart Cath and Coronary Angiography;  Surgeon: [name] M Martinique, MD;  Location: [place] CV LAB;  Service: Cardiovascular;  Laterality: N/A;  . CARDIOVERSION N/A 10/19/2015   Procedure: CARDIOVERSION;  Surgeon: Lelon Perla, MD;  Location: [facility] ENDOSCOPY;  Service: Cardiovascular;  Laterality: N/A;  . CARDIOVERSION N/A 11/13/2017   Procedure: CARDIOVERSION;  Surgeon: Jerline Pain, MD;  Location: [place];  Service: Cardiovascular;  Laterality: N/A;  .  CARDIOVERSION N/A 07/28/2019   Procedure: CARDIOVERSION;  Surgeon: Buford Dresser, MD;  Location: [facility] ENDOSCOPY;  Service: Cardiovascular;  Laterality: N/A;  . [place]  . COLON SURGERY     2004,2007,2013.  3 times colon surgieres  . ESOPHAGOGASTRODUODENOSCOPY (EGD) WITH PROPOFOL Left 03/13/2017   Procedure: ESOPHAGOGASTRODUODENOSCOPY (EGD) WITH PROPOFOL;  Surgeon: Ronnette Juniper, MD;  Location: [place];  Service: Gastroenterology;  Laterality: Left;  . ESOPHAGOGASTRODUODENOSCOPY (EGD) WITH PROPOFOL Left 01/22/2018   Procedure: ESOPHAGOGASTRODUODENOSCOPY (EGD) WITH PROPOFOL;   Surgeon: Arta Silence, MD;  Location: [facility] ENDOSCOPY;  Service: Endoscopy;  Laterality: Left;  . EXCISE LE MANDIBULAR LYMPH NODE T     DR C. NEWMAN  . FOOT SURGERY  1989  . GIVENS CAPSULE STUDY Left 01/23/2018   Procedure: GIVENS CAPSULE STUDY;  Surgeon: Wilford Corner, MD;  Location: [facility] ENDOSCOPY;  Service: Endoscopy;  Laterality: Left;  . LEFT SHOULDER ARTHROSCOPY    . 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 NEUROPLASTY / TRANSPOSITION MEDIAN NERVE AT CARPAL TUNNEL BILATERAL  05/2003  . TEE WITHOUT CARDIOVERSION N/A 10/19/2015   Procedure: Transesophageal Echocardiogram (TEE) ;  Surgeon: Lelon Perla, MD;  Location: [facility] ENDOSCOPY;  Service: Cardiovascular;  Laterality: N/A;  . TEE WITHOUT CARDIOVERSION N/A 10/14/2016   Procedure: TRANSESOPHAGEAL ECHOCARDIOGRAM (TEE);  Surgeon: Sherren Mocha, MD;  Location: [place];  Service: Open Heart Surgery;  Laterality: N/A;  . TEE WITHOUT CARDIOVERSION N/A 07/28/2019   Procedure: TRANSESOPHAGEAL ECHOCARDIOGRAM (TEE);  Surgeon: Buford Dresser, MD;  Location: [facility] ENDOSCOPY;  Service: Cardiovascular;  Laterality: N/A;  . TOTAL SHOULDER ARTHROPLASTY Right 12/18/2015   Procedure: TOTAL SHOULDER ARTHROPLASTY;  Surgeon: [name], MD;  Location: [place];  Service: Orthopedics;  Laterality: Right;  . TRANSCATHETER AORTIC VALVE REPLACEMENT, TRANSFEMORAL N/A 10/14/2016   Procedure: TRANSCATHETER AORTIC VALVE REPLACEMENT, TRANSFEMORAL;  Surgeon: Sherren Mocha, MD;  Location: [place];  Service: Open Heart Surgery;  Laterality: N/A;  . TRIGGER FINGER RELEASE Left   . VAGINAL CYST REMOVED  1967    Family History  Problem Relation Age of Onset  . Diabetes Father   . Stroke Father   . Heart disease Father   . Liver cancer Sister   . Heart disease Brother   . Asthma Sister   . Arthritis Sister   . Breast cancer Neg Hx     Social History   Tobacco Use  . Smoking status: Former Smoker    Packs/day: 1.00    Years: 40.00    Pack years: 40.00    Quit  date: 09/07/1989    Years since quitting: 31.2  . Smokeless tobacco: Never Used  Vaping Use  . Vaping Use: Never used  Substance Use Topics  . Alcohol use: No    Alcohol/week: 0.0 standard drinks  . Drug use: No    Prior to Admission medications   Medication Sig Start Date End Date Taking? Authorizing Provider  acetaminophen (TYLENOL) 650 MG CR tablet Take 1,300 mg by mouth every 8 (eight) hours as needed for pain.   Yes [provider]  albuterol (PROVENTIL) (2.5 MG/3ML) 0.083% nebulizer solution USE 1 VIAL IN NEBULIZER EVERY 4 HOURS AND AS NEEDED. Generic: VENTOLIN 07/12/20  Yes Ramaswamy, Belva Crome, MD  albuterol (VENTOLIN HFA) 108 (90 Base) MCG/ACT inhaler INHALE 2 PUFFS INTO THE LUNGS EVERY 6 HOURS AS NEEDED FOR WHEEZING OR SHORTNESS OF BREATH 10/10/20  Yes [name], NP  amiodarone (PACERONE) 200 MG tablet Take 1 tablet (200 mg total) by mouth  daily. 06/29/20  Yes Martinique, [name] M, MD  azelastine (ASTELIN) 0.1 % nasal spray Place 1 spray into both nostrils 2 (two) times daily. Use in each nostril as directed Patient taking differently: Place 1 spray into both nostrils 2 (two) times daily as needed. Use in each nostril as directed 03/02/20  Yes Martyn Ehrich, NP  Blood Glucose Monitoring Suppl [facility] VERIO) w/Device KIT Check blood sugar twice daily DX:E11.9 10/12/19  Yes [name], NP  cetirizine (ZYRTEC) 10 MG tablet Take 10 mg by mouth daily as needed for allergies.    Yes [provider]  cholecalciferol (VITAMIN D) 1000 units tablet Take 1,000 Units by mouth at bedtime.    Yes [provider]  colestipol (COLESTID) 1 g tablet Take 1 g by mouth 2 (two) times daily. 06/29/20  Yes [provider]  Dextromethorphan-guaiFENesin (MUCINEX DM) 30-600 MG TB12 Take 2 tablets by mouth as needed.   Yes [provider]  doxazosin (CARDURA) 4 MG tablet TAKE 1 TABLET BY MOUTH EVERY DAY TO HELP CONTROL BLOOD PRESSURE 09/12/20  Yes [name], NP  ferrous sulfate 325 (65 FE) MG EC tablet Take 325 mg by mouth 2 (two) times daily.   Yes [provider]  furosemide (LASIX) 40 MG tablet TAKE 2 TABLETS BY MOUTH DAILY AND 1 TABLET BY MOUTH AT LUNCH on  Mondays, Wednesdays and Fridays Patient taking differently: TAKE 2 TABLETS BY MOUTH DAILY AND   Mon/ Wed/ Fri, 2 tablets in the morning , and 1 TABLET BY MOUTH AT LUNCH 09/17/20  Yes Eubanks, Carlos American, NP  glimepiride (AMARYL) 1 MG tablet TAKE 1 TABLET(1 MG) BY MOUTH DAILY WITH BREAKFAST 11/19/20  Yes Sherrie Mustache K, NP  glucose blood test strip 1 each by Other route 2 (two) times daily. OneTouch Verio DX E11.8 10/12/19  Yes [name], NP  JANUVIA 50 MG tablet TAKE 1 TABLET(50 MG) BY MOUTH DAILY 10/10/20  Yes [name], NP  Lancets [facility] ULTRASOFT) lancets 1 each by Other route 2 (two) times daily. OneTouch Verio DX E11.9 10/12/19  Yes [name], NP  levothyroxine (SYNTHROID) 88 MCG tablet TAKE 1 TABLET BY MOUTH DAILY ON AN EMPTY STOMACH 08/29/20  Yes [name], NP  OXYGEN Inhale 2 L into the lungs continuous. Pt also uses 4L pulse via POC   Yes [provider]  pantoprazole (PROTONIX) 40 MG tablet TAKE 1 TABLET(40 MG) BY MOUTH TWICE DAILY 07/11/20  Yes [name], NP  potassium gluconate 595 (99 K) MG TABS tablet Take 595 mg by mouth 2 (two) times daily.   Yes [provider]  rosuvastatin (CRESTOR) 10 MG tablet TAKE 1 TABLET(10 MG) BY MOUTH DAILY 10/31/20  Yes [name], NP  TRELEGY ELLIPTA 200-62.5-25 MCG/INH AEPB INHALE 1 PUFF INTO THE LUNGS DAILY 10/15/20  Yes Martyn Ehrich, NP    Allergies Codeine and Vesicare [solifenacin]   REVIEW OF SYSTEMS  Negative except as noted here or in the History of Present Illness.   PHYSICAL EXAMINATION  Initial Vital Signs Blood pressure (!) 166/64, pulse 92, temperature 97.8 F (36.6 C), temperature source Oral, resp. rate (!) 22, height _0  (1.6  m), weight 94.8 kg, SpO2 100 %.  Examination General: Well-developed, well-nourished female in no acute distress; appearance consistent with age of record HENT: normocephalic; atraumatic Eyes: pupils equal, round and reactive to light; extraocular muscles intact Neck: supple Heart: regular rate and rhythm Lungs: Faint  inspiratory and expiratory wheezing Abdomen: soft; nondistended; nontender; bowel sounds present Extremities: No acute deformity; pain on passive range of motion left shoulder; chronic appearing edema of lower legs Neurologic: Awake, alert and oriented; motor function intact in all extremities and symmetric; no facial droop Skin: Warm and dry Psychiatric: Normal mood and affect   RESULTS  Summary of this visit's results, reviewed and interpreted by myself:   EKG Interpretation  Date/Time:  Monday December 17 2020 00:34:33 EST Ventricular Rate:  76 PR Interval:    QRS Duration: 184 QT Interval:  467 QTC Calculation: 526 R Axis:   44 Text Interpretation: Sinus rhythm Short PR interval Left bundle branch block Rate is faster Confirmed by Mercia Dowe, Jenny Reichmann [id number]) on 12/17/2020 12:47:16 AM      Laboratory Studies: Results for orders placed or performed during the hospital encounter of 12/16/20 (from the past 24 hour(s))  CBC with Differential/Platelet     Status: Abnormal   Collection Time: 12/17/20 12:22 AM  Result Value Ref Range   WBC 8.2 4.0 - 10.5 K/uL   RBC 3.89 3.87 - 5.11 MIL/uL   Hemoglobin 11.0 (L) 12.0 - 15.0 g/dL   HCT 37.2 36.0 - 46.0 %   MCV 95.6 80.0 - 100.0 fL   MCH 28.3 26.0 - 34.0 pg   MCHC 29.6 (L) 30.0 - 36.0 g/dL   RDW 16.5 (H) 11.5 - 15.5 %   Platelets 139 (L) 150 - 400 K/uL   nRBC 0.0 0.0 - 0.2 %   Neutrophils Relative % 84 %   Neutro Abs 7.0 1.7 - 7.7 K/uL   Lymphocytes Relative 8 %   Lymphs Abs 0.6 (L) 0.7 - 4.0 K/uL   Monocytes Relative 6 %   Monocytes Absolute 0.5 0.1 - 1.0 K/uL   Eosinophils Relative 0 %   Eosinophils Absolute 0.0  0.0 - 0.5 K/uL   Basophils Relative 1 %   Basophils Absolute 0.0 0.0 - 0.1 K/uL   Immature Granulocytes 1 %   Abs Immature Granulocytes 0.07 0.00 - 0.07 K/uL  Basic metabolic panel     Status: Abnormal   Collection Time: 12/17/20 12:22 AM  Result Value Ref Range   Sodium 139 135 - 145 mmol/L   Potassium 4.3 3.5 - 5.1 mmol/L   Chloride 95 (L) 98 - 111 mmol/L   CO2 32 22 - 32 mmol/L   Glucose, Bld 216 (H) 70 - 99 mg/dL   BUN 52 (H) 8 - 23 mg/dL   Creatinine, Ser 2.02 (H) 0.44 - 1.00 mg/dL   Calcium 9.1 8.9 - 10.3 mg/dL   GFR, Estimated 24 (L) >60 mL/min   Anion gap 12 5 - 15  Brain natriuretic peptide     Status: Abnormal   Collection Time: 12/17/20 12:22 AM  Result Value Ref Range   B Natriuretic Peptide 574.3 (H) 0.0 - 100.0 pg/mL  Urinalysis, Routine w reflex microscopic Urine, Clean Catch     Status: Abnormal   Collection Time: 12/17/20 12:22 AM  Result Value Ref Range   Color, Urine YELLOW YELLOW   APPearance CLEAR CLEAR   Specific Gravity, Urine 1.011 1.005 - 1.030   pH 6.0 5.0 - 8.0   Glucose, UA NEGATIVE NEGATIVE mg/dL   Hgb urine dipstick NEGATIVE NEGATIVE   Bilirubin Urine NEGATIVE NEGATIVE   Ketones, ur NEGATIVE NEGATIVE mg/dL   Protein, ur 30 (A) NEGATIVE mg/dL   Nitrite NEGATIVE NEGATIVE   Leukocytes,Ua NEGATIVE NEGATIVE   RBC / HPF 0-5 0 -  5 RBC/hpf   WBC, UA 0-5 0 - 5 WBC/hpf   Bacteria, UA RARE (A) NONE SEEN   Squamous Epithelial / LPF 0-5 0 - 5   Mucus PRESENT    Imaging Studies: DG Chest 2 View  Result Date: 12/17/2020 CLINICAL DATA:  Status post fall. EXAM: CHEST - 2 VIEW COMPARISON:  None. FINDINGS: Diffuse chronic appearing increased interstitial lung markings are seen. Mild areas of atelectasis and/or infiltrate are also noted within the bilateral lung bases. There are small bilateral pleural effusions. No pneumothorax is identified. The cardiac silhouette is markedly enlarged. A right shoulder replacement is seen. Stable widening of the left  acromioclavicular joint is seen. No acute osseous abnormalities are identified. Radiopaque surgical clips are seen within the right upper quadrant. IMPRESSION: 1. Stable cardiomegaly with chronic appearing increased interstitial lung markings and mild bibasilar atelectasis and/or infiltrate. 2. Small bilateral pleural effusions. Electronically Signed   By: Virgina Norfolk M.D.   On: 12/17/2020 01:48   DG Shoulder Left  Result Date: 12/17/2020 CLINICAL DATA:  Status post fall. EXAM: LEFT SHOULDER - 2+ VIEW COMPARISON:  None. FINDINGS: There is no evidence of an acute fracture or dislocation. There is a chronic deformity of the distal left clavicle. Mild degenerative changes seen involving the left glenohumeral articulation. Soft tissues are unremarkable. IMPRESSION: No acute osseous abnormality. Electronically Signed   By: Virgina Norfolk M.D.   On: 12/17/2020 01:49    ED COURSE and MDM  Nursing notes, initial and subsequent vitals signs, including pulse oximetry, reviewed and interpreted by myself.  Vitals:   12/17/20 0330 12/17/20 0400 12/17/20 0430 12/17/20 0500  BP: (!) 152/53 (!) 143/62 (!) 142/51 (!) 137/42  Pulse: 65 69 65 61  Resp: (!) 28 (!) 24 18 (!) 25  Temp:      TempSrc:      SpO2: 99% 93% 95% 94%  Weight:      Height:       Medications  ipratropium-albuterol (DUONEB) 0.5-2.5 (3) MG/3ML nebulizer solution 3 mL (3 mLs Nebulization Given 12/17/20 0248)  furosemide (LASIX) injection 40 mg (40 mg Intravenous Given 12/17/20 0345)   5:33 AM Patient's oxygen saturation is 92% on 3 L after Lasix diuresis and additional DuoNeb treatment. Patient states she feels good enough to go home and was advised to keep her oxygen at 3 L until she follows up with her PCP. She states her daughter will pick her up and give her a ride to her house so she does have that support available. She was advised to take Tylenol or ibuprofen as needed for her shoulder pain. No radiographic evidence of fracture  was seen.   PROCEDURES  Procedures   ED DIAGNOSES     ICD-10-CM   1. Fall in home, initial encounter  W19.XXXA    Y92.009   2. Injury of left shoulder, initial encounter  S49.92XA   3. COPD exacerbation ([place])  J44.1        Serrina Minogue, MD 12/17/20 [phone]

## 2020-12-17 NOTE — ED Triage Notes (Signed)
Pt presents from home via [facility], reports a mechanical fall around 7pm and was unable to get up after. Wears 2L  at baseline and was without oxygen for at least 2hrs. Upon EMS arrival oxygen was 50% RA. Placed on NRB by EMS and given 1 duoneb and 2g magnesium sulfate. C/o L shoulder pain from fall, denies hitting head or LOC.

## 2020-12-17 NOTE — ED Notes (Signed)
Pt called daughter for ride home and to bring oxygen for discharge.

## 2020-12-28 DIAGNOSIS — J449 Chronic obstructive pulmonary disease, unspecified: Secondary | ICD-10-CM | POA: Diagnosis not present

## 2021-01-11 ENCOUNTER — Telehealth: Payer: Self-pay | Admitting: Cardiology

## 2021-01-11 NOTE — Telephone Encounter (Signed)
Spoke to patient Dr.Jordan's advice given.Appointment scheduled with Dr.Jordan 3/30 at 9:30 am.Advised if symptoms worsen go to ED.

## 2021-01-11 NOTE — Telephone Encounter (Signed)
Spoke with pt, this morning she has noticed her heart rate is fluctuating from 97 to 114 bpm. Usually it is 70 to 73.she reports no chest pain but maybe a little more SOB than her usual. She is dizzy all the time and that is her normal. Her bp is 123/55, she took her amiodarone 200 mg about 2 hours ago. She reports she can have EMS come to the house to do an EKG if we would like. Aware will forward to dr Martinique to review and advise.

## 2021-01-11 NOTE — Telephone Encounter (Signed)
Patient c/o Palpitations:  High priority if patient c/o lightheadedness, shortness of breath, or chest pain  1) How long have you had palpitations/irregular HR/ Afib? Are you having the symptoms now? Today  2) Are you currently experiencing lightheadedness, SOB or CP? Dizziness, SOB  3) Do you have a history of afib (atrial fibrillation) or irregular heart rhythm? Yes   4) Have you checked your BP or HR? (document readings if available): 123/55; 103  5) Are you experiencing any other symptoms? Headache

## 2021-01-11 NOTE — Telephone Encounter (Signed)
She was in NSR last month. Her heart rate is controlled. It is certainly possible she is back in Afib. I would recommend follow up in office next week with APP. I don't think she needs to call EMS or go to the ED unless symptoms worsen.   Ulysses Alper Martinique MD, [facility]

## 2021-01-14 ENCOUNTER — Telehealth: Payer: Self-pay | Admitting: Primary Care

## 2021-01-14 MED ORDER — PREDNISONE 10 MG PO TABS: ORAL_TABLET | ORAL | 0 refills | Status: AC

## 2021-01-14 NOTE — Telephone Encounter (Signed)
MR patient last seen Beth on 06/25/2020 for hypoventilation syndrome.  Patient reports of dry cough, chest tightness, wheezing and increased sob. Sx have been present for 4 days. Denied fever, chills, sweats or additional sx.  She is using albuterol nebulizer solution TID, albuterol HFA 5-6x daily and trelegy once daily with no relief in sx.  Not using any OTC meds to help with sx.  She wears 2L cont. spo2 is maintaining around 93%.  She has had all three covid vaccines and flu shot. She would like a Rx of prednisone, as this has help with similar symptoms previously.   Beth, please advise. Thanks

## 2021-01-14 NOTE — Telephone Encounter (Signed)
Spoke to patient and relayed below recommendations.  Patient stated that she no longer drives, and relays on her daughter to bring her to her appt.  She will call back to schedule and appt once she has her daughters schedule.  Nothing further needed at this time.

## 2021-01-14 NOTE — Telephone Encounter (Signed)
Will send in RX for prednisone taper for COPD exacerbation. Make sure she has a follow up with MR as she is overdue

## 2021-01-15 ENCOUNTER — Emergency Department (HOSPITAL_COMMUNITY): Payer: Medicare Other

## 2021-01-15 ENCOUNTER — Inpatient Hospital Stay (HOSPITAL_COMMUNITY)
Admission: EM | Admit: 2021-01-15 | Discharge: 2021-02-24 | DRG: 286 | Disposition: E | Payer: Medicare Other | Attending: Internal Medicine | Admitting: Internal Medicine

## 2021-01-15 DIAGNOSIS — T8249XA Other complication of vascular dialysis catheter, initial encounter: Secondary | ICD-10-CM | POA: Diagnosis not present

## 2021-01-15 DIAGNOSIS — F40240 Claustrophobia: Secondary | ICD-10-CM | POA: Diagnosis present

## 2021-01-15 DIAGNOSIS — I152 Hypertension secondary to endocrine disorders: Secondary | ICD-10-CM | POA: Diagnosis present

## 2021-01-15 DIAGNOSIS — I361 Nonrheumatic tricuspid (valve) insufficiency: Secondary | ICD-10-CM | POA: Diagnosis not present

## 2021-01-15 DIAGNOSIS — E039 Hypothyroidism, unspecified: Secondary | ICD-10-CM | POA: Diagnosis present

## 2021-01-15 DIAGNOSIS — I2510 Atherosclerotic heart disease of native coronary artery without angina pectoris: Secondary | ICD-10-CM | POA: Diagnosis present

## 2021-01-15 DIAGNOSIS — Y848 Other medical procedures as the cause of abnormal reaction of the patient, or of later complication, without mention of misadventure at the time of the procedure: Secondary | ICD-10-CM | POA: Diagnosis not present

## 2021-01-15 DIAGNOSIS — Z6839 Body mass index (BMI) 39.0-39.9, adult: Secondary | ICD-10-CM

## 2021-01-15 DIAGNOSIS — D696 Thrombocytopenia, unspecified: Secondary | ICD-10-CM | POA: Diagnosis present

## 2021-01-15 DIAGNOSIS — E871 Hypo-osmolality and hyponatremia: Secondary | ICD-10-CM | POA: Diagnosis not present

## 2021-01-15 DIAGNOSIS — E1165 Type 2 diabetes mellitus with hyperglycemia: Secondary | ICD-10-CM | POA: Diagnosis not present

## 2021-01-15 DIAGNOSIS — Z8261 Family history of arthritis: Secondary | ICD-10-CM

## 2021-01-15 DIAGNOSIS — I10 Essential (primary) hypertension: Secondary | ICD-10-CM | POA: Diagnosis not present

## 2021-01-15 DIAGNOSIS — Z9981 Dependence on supplemental oxygen: Secondary | ICD-10-CM

## 2021-01-15 DIAGNOSIS — I255 Ischemic cardiomyopathy: Secondary | ICD-10-CM | POA: Diagnosis present

## 2021-01-15 DIAGNOSIS — I5031 Acute diastolic (congestive) heart failure: Secondary | ICD-10-CM | POA: Diagnosis not present

## 2021-01-15 DIAGNOSIS — Z515 Encounter for palliative care: Secondary | ICD-10-CM

## 2021-01-15 DIAGNOSIS — G9349 Other encephalopathy: Secondary | ICD-10-CM | POA: Diagnosis not present

## 2021-01-15 DIAGNOSIS — Z9181 History of falling: Secondary | ICD-10-CM

## 2021-01-15 DIAGNOSIS — I499 Cardiac arrhythmia, unspecified: Secondary | ICD-10-CM | POA: Diagnosis not present

## 2021-01-15 DIAGNOSIS — I959 Hypotension, unspecified: Secondary | ICD-10-CM | POA: Diagnosis not present

## 2021-01-15 DIAGNOSIS — N184 Chronic kidney disease, stage 4 (severe): Secondary | ICD-10-CM | POA: Diagnosis not present

## 2021-01-15 DIAGNOSIS — G9341 Metabolic encephalopathy: Secondary | ICD-10-CM | POA: Diagnosis not present

## 2021-01-15 DIAGNOSIS — Z952 Presence of prosthetic heart valve: Secondary | ICD-10-CM | POA: Diagnosis not present

## 2021-01-15 DIAGNOSIS — E1159 Type 2 diabetes mellitus with other circulatory complications: Secondary | ICD-10-CM | POA: Diagnosis not present

## 2021-01-15 DIAGNOSIS — E1129 Type 2 diabetes mellitus with other diabetic kidney complication: Secondary | ICD-10-CM | POA: Diagnosis not present

## 2021-01-15 DIAGNOSIS — I5020 Unspecified systolic (congestive) heart failure: Secondary | ICD-10-CM | POA: Diagnosis not present

## 2021-01-15 DIAGNOSIS — D509 Iron deficiency anemia, unspecified: Secondary | ICD-10-CM | POA: Diagnosis present

## 2021-01-15 DIAGNOSIS — Z825 Family history of asthma and other chronic lower respiratory diseases: Secondary | ICD-10-CM

## 2021-01-15 DIAGNOSIS — K56600 Partial intestinal obstruction, unspecified as to cause: Secondary | ICD-10-CM | POA: Diagnosis not present

## 2021-01-15 DIAGNOSIS — Z96611 Presence of right artificial shoulder joint: Secondary | ICD-10-CM | POA: Diagnosis not present

## 2021-01-15 DIAGNOSIS — Z79890 Hormone replacement therapy (postmenopausal): Secondary | ICD-10-CM

## 2021-01-15 DIAGNOSIS — Z7984 Long term (current) use of oral hypoglycemic drugs: Secondary | ICD-10-CM

## 2021-01-15 DIAGNOSIS — Z800 Family history of malignant neoplasm of digestive organs: Secondary | ICD-10-CM

## 2021-01-15 DIAGNOSIS — Z20822 Contact with and (suspected) exposure to covid-19: Secondary | ICD-10-CM | POA: Diagnosis not present

## 2021-01-15 DIAGNOSIS — J9622 Acute and chronic respiratory failure with hypercapnia: Secondary | ICD-10-CM | POA: Diagnosis not present

## 2021-01-15 DIAGNOSIS — I130 Hypertensive heart and chronic kidney disease with heart failure and stage 1 through stage 4 chronic kidney disease, or unspecified chronic kidney disease: Principal | ICD-10-CM | POA: Diagnosis present

## 2021-01-15 DIAGNOSIS — D631 Anemia in chronic kidney disease: Secondary | ICD-10-CM | POA: Diagnosis not present

## 2021-01-15 DIAGNOSIS — E662 Morbid (severe) obesity with alveolar hypoventilation: Secondary | ICD-10-CM | POA: Diagnosis not present

## 2021-01-15 DIAGNOSIS — Z7189 Other specified counseling: Secondary | ICD-10-CM | POA: Diagnosis not present

## 2021-01-15 DIAGNOSIS — I2722 Pulmonary hypertension due to left heart disease: Secondary | ICD-10-CM | POA: Diagnosis present

## 2021-01-15 DIAGNOSIS — R34 Anuria and oliguria: Secondary | ICD-10-CM | POA: Diagnosis not present

## 2021-01-15 DIAGNOSIS — I2609 Other pulmonary embolism with acute cor pulmonale: Secondary | ICD-10-CM | POA: Diagnosis not present

## 2021-01-15 DIAGNOSIS — I5023 Acute on chronic systolic (congestive) heart failure: Secondary | ICD-10-CM | POA: Diagnosis not present

## 2021-01-15 DIAGNOSIS — Z90710 Acquired absence of both cervix and uterus: Secondary | ICD-10-CM | POA: Diagnosis not present

## 2021-01-15 DIAGNOSIS — R54 Age-related physical debility: Secondary | ICD-10-CM | POA: Diagnosis present

## 2021-01-15 DIAGNOSIS — M549 Dorsalgia, unspecified: Secondary | ICD-10-CM | POA: Diagnosis present

## 2021-01-15 DIAGNOSIS — Z823 Family history of stroke: Secondary | ICD-10-CM

## 2021-01-15 DIAGNOSIS — Z66 Do not resuscitate: Secondary | ICD-10-CM | POA: Diagnosis not present

## 2021-01-15 DIAGNOSIS — Z953 Presence of xenogenic heart valve: Secondary | ICD-10-CM | POA: Diagnosis not present

## 2021-01-15 DIAGNOSIS — L89151 Pressure ulcer of sacral region, stage 1: Secondary | ICD-10-CM | POA: Diagnosis not present

## 2021-01-15 DIAGNOSIS — E785 Hyperlipidemia, unspecified: Secondary | ICD-10-CM | POA: Diagnosis present

## 2021-01-15 DIAGNOSIS — I5082 Biventricular heart failure: Secondary | ICD-10-CM | POA: Diagnosis present

## 2021-01-15 DIAGNOSIS — Z833 Family history of diabetes mellitus: Secondary | ICD-10-CM | POA: Diagnosis not present

## 2021-01-15 DIAGNOSIS — J811 Chronic pulmonary edema: Secondary | ICD-10-CM | POA: Diagnosis not present

## 2021-01-15 DIAGNOSIS — J9601 Acute respiratory failure with hypoxia: Secondary | ICD-10-CM | POA: Diagnosis not present

## 2021-01-15 DIAGNOSIS — J9621 Acute and chronic respiratory failure with hypoxia: Principal | ICD-10-CM | POA: Diagnosis present

## 2021-01-15 DIAGNOSIS — L89159 Pressure ulcer of sacral region, unspecified stage: Secondary | ICD-10-CM

## 2021-01-15 DIAGNOSIS — E872 Acidosis: Secondary | ICD-10-CM | POA: Diagnosis not present

## 2021-01-15 DIAGNOSIS — Z79899 Other long term (current) drug therapy: Secondary | ICD-10-CM

## 2021-01-15 DIAGNOSIS — I5030 Unspecified diastolic (congestive) heart failure: Secondary | ICD-10-CM

## 2021-01-15 DIAGNOSIS — Q211 Atrial septal defect: Secondary | ICD-10-CM | POA: Diagnosis not present

## 2021-01-15 DIAGNOSIS — I428 Other cardiomyopathies: Secondary | ICD-10-CM | POA: Diagnosis present

## 2021-01-15 DIAGNOSIS — N186 End stage renal disease: Secondary | ICD-10-CM | POA: Diagnosis not present

## 2021-01-15 DIAGNOSIS — J90 Pleural effusion, not elsewhere classified: Secondary | ICD-10-CM | POA: Diagnosis not present

## 2021-01-15 DIAGNOSIS — I4819 Other persistent atrial fibrillation: Secondary | ICD-10-CM | POA: Diagnosis present

## 2021-01-15 DIAGNOSIS — I110 Hypertensive heart disease with heart failure: Secondary | ICD-10-CM | POA: Diagnosis not present

## 2021-01-15 DIAGNOSIS — Z8249 Family history of ischemic heart disease and other diseases of the circulatory system: Secondary | ICD-10-CM

## 2021-01-15 DIAGNOSIS — Z87891 Personal history of nicotine dependence: Secondary | ICD-10-CM

## 2021-01-15 DIAGNOSIS — I447 Left bundle-branch block, unspecified: Secondary | ICD-10-CM | POA: Diagnosis present

## 2021-01-15 DIAGNOSIS — I480 Paroxysmal atrial fibrillation: Secondary | ICD-10-CM | POA: Diagnosis present

## 2021-01-15 DIAGNOSIS — I340 Nonrheumatic mitral (valve) insufficiency: Secondary | ICD-10-CM | POA: Diagnosis not present

## 2021-01-15 DIAGNOSIS — Z452 Encounter for adjustment and management of vascular access device: Secondary | ICD-10-CM

## 2021-01-15 DIAGNOSIS — Z885 Allergy status to narcotic agent status: Secondary | ICD-10-CM

## 2021-01-15 DIAGNOSIS — J449 Chronic obstructive pulmonary disease, unspecified: Secondary | ICD-10-CM | POA: Diagnosis present

## 2021-01-15 DIAGNOSIS — I509 Heart failure, unspecified: Secondary | ICD-10-CM

## 2021-01-15 DIAGNOSIS — E1122 Type 2 diabetes mellitus with diabetic chronic kidney disease: Secondary | ICD-10-CM | POA: Diagnosis present

## 2021-01-15 DIAGNOSIS — Z743 Need for continuous supervision: Secondary | ICD-10-CM | POA: Diagnosis not present

## 2021-01-15 DIAGNOSIS — I5043 Acute on chronic combined systolic (congestive) and diastolic (congestive) heart failure: Secondary | ICD-10-CM | POA: Diagnosis not present

## 2021-01-15 DIAGNOSIS — I081 Rheumatic disorders of both mitral and tricuspid valves: Secondary | ICD-10-CM | POA: Diagnosis present

## 2021-01-15 DIAGNOSIS — K760 Fatty (change of) liver, not elsewhere classified: Secondary | ICD-10-CM | POA: Diagnosis present

## 2021-01-15 DIAGNOSIS — Z9049 Acquired absence of other specified parts of digestive tract: Secondary | ICD-10-CM

## 2021-01-15 DIAGNOSIS — J441 Chronic obstructive pulmonary disease with (acute) exacerbation: Secondary | ICD-10-CM | POA: Diagnosis not present

## 2021-01-15 DIAGNOSIS — K5900 Constipation, unspecified: Secondary | ICD-10-CM

## 2021-01-15 DIAGNOSIS — N179 Acute kidney failure, unspecified: Secondary | ICD-10-CM | POA: Diagnosis not present

## 2021-01-15 DIAGNOSIS — Z5329 Procedure and treatment not carried out because of patient's decision for other reasons: Secondary | ICD-10-CM | POA: Diagnosis not present

## 2021-01-15 DIAGNOSIS — I5033 Acute on chronic diastolic (congestive) heart failure: Secondary | ICD-10-CM | POA: Diagnosis not present

## 2021-01-15 DIAGNOSIS — R000 Tachycardia, unspecified: Secondary | ICD-10-CM | POA: Diagnosis not present

## 2021-01-15 DIAGNOSIS — E119 Type 2 diabetes mellitus without complications: Secondary | ICD-10-CM

## 2021-01-15 DIAGNOSIS — Z789 Other specified health status: Secondary | ICD-10-CM | POA: Diagnosis not present

## 2021-01-15 DIAGNOSIS — R0902 Hypoxemia: Secondary | ICD-10-CM | POA: Diagnosis not present

## 2021-01-15 DIAGNOSIS — I5021 Acute systolic (congestive) heart failure: Secondary | ICD-10-CM | POA: Diagnosis not present

## 2021-01-15 DIAGNOSIS — I2723 Pulmonary hypertension due to lung diseases and hypoxia: Secondary | ICD-10-CM | POA: Diagnosis present

## 2021-01-15 DIAGNOSIS — I4891 Unspecified atrial fibrillation: Secondary | ICD-10-CM | POA: Diagnosis not present

## 2021-01-15 DIAGNOSIS — K219 Gastro-esophageal reflux disease without esophagitis: Secondary | ICD-10-CM | POA: Diagnosis present

## 2021-01-15 DIAGNOSIS — Z888 Allergy status to other drugs, medicaments and biological substances status: Secondary | ICD-10-CM

## 2021-01-15 DIAGNOSIS — N1832 Chronic kidney disease, stage 3b: Secondary | ICD-10-CM | POA: Diagnosis not present

## 2021-01-15 DIAGNOSIS — E1169 Type 2 diabetes mellitus with other specified complication: Secondary | ICD-10-CM | POA: Diagnosis present

## 2021-01-15 DIAGNOSIS — I517 Cardiomegaly: Secondary | ICD-10-CM | POA: Diagnosis not present

## 2021-01-15 DIAGNOSIS — R0602 Shortness of breath: Secondary | ICD-10-CM | POA: Diagnosis not present

## 2021-01-15 LAB — COMPREHENSIVE METABOLIC PANEL
ALT: 18 U/L (ref 0–44)
AST: 29 U/L (ref 15–41)
Albumin: 3.8 g/dL (ref 3.5–5.0)
Alkaline Phosphatase: 55 U/L (ref 38–126)
Anion gap: 9 (ref 5–15)
BUN: 60 mg/dL — ABNORMAL HIGH (ref 8–23)
CO2: 31 mmol/L (ref 22–32)
Calcium: 9.4 mg/dL (ref 8.9–10.3)
Chloride: 97 mmol/L — ABNORMAL LOW (ref 98–111)
Creatinine, Ser: 2.18 mg/dL — ABNORMAL HIGH (ref 0.44–1.00)
GFR, Estimated: 22 mL/min — ABNORMAL LOW (ref >60)
Glucose, Bld: 223 mg/dL — ABNORMAL HIGH (ref 70–99)
Potassium: 5.7 mmol/L — ABNORMAL HIGH (ref 3.5–5.1)
Sodium: 137 mmol/L (ref 135–145)
Total Bilirubin: 0.7 mg/dL (ref 0.3–1.2)
Total Protein: 7.1 g/dL (ref 6.5–8.1)

## 2021-01-15 LAB — CBC WITH DIFFERENTIAL/PLATELET
Abs Immature Granulocytes: 0.07 10*3/uL (ref 0.00–0.07)
Basophils Absolute: 0 10*3/uL (ref 0.0–0.1)
Basophils Relative: 0 %
Eosinophils Absolute: 0 10*3/uL (ref 0.0–0.5)
Eosinophils Relative: 0 %
HCT: 34.2 % — ABNORMAL LOW (ref 36.0–46.0)
Hemoglobin: 10.2 g/dL — ABNORMAL LOW (ref 12.0–15.0)
Immature Granulocytes: 1 %
Lymphocytes Relative: 5 %
Lymphs Abs: 0.3 10*3/uL — ABNORMAL LOW (ref 0.7–4.0)
MCH: 28.4 pg (ref 26.0–34.0)
MCHC: 29.8 g/dL — ABNORMAL LOW (ref 30.0–36.0)
MCV: 95.3 fL (ref 80.0–100.0)
Monocytes Absolute: 0.2 10*3/uL (ref 0.1–1.0)
Monocytes Relative: 3 %
Neutro Abs: 5.3 10*3/uL (ref 1.7–7.7)
Neutrophils Relative %: 91 %
Platelets: 150 10*3/uL (ref 150–400)
RBC: 3.59 MIL/uL — ABNORMAL LOW (ref 3.87–5.11)
RDW: 16.1 % — ABNORMAL HIGH (ref 11.5–15.5)
WBC: 5.8 10*3/uL (ref 4.0–10.5)
nRBC: 0 % (ref 0.0–0.2)

## 2021-01-15 LAB — RESP PANEL BY RT-PCR (FLU A&B, COVID) ARPGX2
Influenza A by PCR: NEGATIVE
Influenza B by PCR: NEGATIVE
SARS Coronavirus 2 by RT PCR: NEGATIVE

## 2021-01-15 LAB — BRAIN NATRIURETIC PEPTIDE: B Natriuretic Peptide: 1681.6 pg/mL — ABNORMAL HIGH (ref 0.0–100.0)

## 2021-01-15 LAB — TROPONIN I (HIGH SENSITIVITY): Troponin I (High Sensitivity): 35 ng/L — ABNORMAL HIGH (ref <18)

## 2021-01-15 MED ORDER — AMIODARONE HCL 200 MG PO TABS
200.0000 mg | ORAL_TABLET | Freq: Every day | ORAL | Status: DC
  Administered 2021-01-16 – 2021-01-18 (×3): 200 mg via ORAL
  Filled 2021-01-15 (×3): qty 1

## 2021-01-15 MED ORDER — FLUTICASONE-UMECLIDIN-VILANT 200-62.5-25 MCG/INH IN AEPB: 1.0000 | INHALATION_SPRAY | Freq: Every day | RESPIRATORY_TRACT | Status: DC

## 2021-01-15 MED ORDER — HEPARIN SODIUM (PORCINE) 5000 UNIT/ML IJ SOLN
5000.0000 [IU] | Freq: Three times a day (TID) | INTRAMUSCULAR | Status: DC
  Administered 2021-01-16: 5000 [IU] via SUBCUTANEOUS
  Filled 2021-01-15 (×2): qty 1

## 2021-01-15 MED ORDER — ACETAMINOPHEN 650 MG RE SUPP: 650.0000 mg | Freq: Four times a day (QID) | RECTAL | Status: DC | PRN

## 2021-01-15 MED ORDER — ALBUTEROL SULFATE (2.5 MG/3ML) 0.083% IN NEBU
2.5000 mg | INHALATION_SOLUTION | RESPIRATORY_TRACT | Status: DC | PRN
  Administered 2021-01-16: 2.5 mg via RESPIRATORY_TRACT
  Filled 2021-01-15: qty 3

## 2021-01-15 MED ORDER — INSULIN ASPART 100 UNIT/ML ~~LOC~~ SOLN
0.0000 [IU] | Freq: Three times a day (TID) | SUBCUTANEOUS | Status: DC
  Administered 2021-01-16: 1 [IU] via SUBCUTANEOUS
  Administered 2021-01-16 – 2021-01-17 (×2): 2 [IU] via SUBCUTANEOUS
  Administered 2021-01-17: 1 [IU] via SUBCUTANEOUS
  Administered 2021-01-18: 2 [IU] via SUBCUTANEOUS
  Administered 2021-01-19: 1 [IU] via SUBCUTANEOUS
  Administered 2021-01-19: 2 [IU] via SUBCUTANEOUS
  Administered 2021-01-19: 3 [IU] via SUBCUTANEOUS
  Administered 2021-01-20 (×2): 2 [IU] via SUBCUTANEOUS
  Administered 2021-01-20: 3 [IU] via SUBCUTANEOUS
  Administered 2021-01-21: 2 [IU] via SUBCUTANEOUS
  Administered 2021-01-21 (×2): 3 [IU] via SUBCUTANEOUS
  Administered 2021-01-22 (×2): 2 [IU] via SUBCUTANEOUS
  Administered 2021-01-22: 3 [IU] via SUBCUTANEOUS
  Administered 2021-01-23 – 2021-01-24 (×5): 2 [IU] via SUBCUTANEOUS

## 2021-01-15 MED ORDER — ONDANSETRON HCL 4 MG/2ML IJ SOLN
4.0000 mg | Freq: Four times a day (QID) | INTRAMUSCULAR | Status: DC | PRN
  Administered 2021-01-22 – 2021-01-24 (×6): 4 mg via INTRAVENOUS
  Filled 2021-01-15 (×7): qty 2

## 2021-01-15 MED ORDER — INSULIN ASPART 100 UNIT/ML ~~LOC~~ SOLN
0.0000 [IU] | Freq: Every day | SUBCUTANEOUS | Status: DC
  Administered 2021-01-20 – 2021-01-21 (×2): 2 [IU] via SUBCUTANEOUS

## 2021-01-15 MED ORDER — SODIUM CHLORIDE 0.9% FLUSH
3.0000 mL | Freq: Two times a day (BID) | INTRAVENOUS | Status: DC
  Administered 2021-01-16 – 2021-01-25 (×13): 3 mL via INTRAVENOUS

## 2021-01-15 MED ORDER — ROSUVASTATIN CALCIUM 5 MG PO TABS
10.0000 mg | ORAL_TABLET | Freq: Every day | ORAL | Status: DC
  Administered 2021-01-16 – 2021-01-25 (×10): 10 mg via ORAL
  Filled 2021-01-15 (×10): qty 2

## 2021-01-15 MED ORDER — PANTOPRAZOLE SODIUM 40 MG PO TBEC
40.0000 mg | DELAYED_RELEASE_TABLET | Freq: Two times a day (BID) | ORAL | Status: DC
  Administered 2021-01-16 – 2021-01-25 (×20): 40 mg via ORAL
  Filled 2021-01-15 (×20): qty 1

## 2021-01-15 MED ORDER — ONDANSETRON HCL 4 MG PO TABS
4.0000 mg | ORAL_TABLET | Freq: Four times a day (QID) | ORAL | Status: DC | PRN
  Administered 2021-01-21 – 2021-01-22 (×2): 4 mg via ORAL
  Filled 2021-01-15 (×2): qty 1

## 2021-01-15 MED ORDER — FUROSEMIDE 10 MG/ML IJ SOLN
40.0000 mg | Freq: Once | INTRAMUSCULAR | Status: AC
  Administered 2021-01-15: 40 mg via INTRAVENOUS
  Filled 2021-01-15: qty 4

## 2021-01-15 MED ORDER — LEVOTHYROXINE SODIUM 88 MCG PO TABS
88.0000 ug | ORAL_TABLET | Freq: Every day | ORAL | Status: DC
  Administered 2021-01-16 – 2021-01-25 (×10): 88 ug via ORAL
  Filled 2021-01-15 (×10): qty 1

## 2021-01-15 MED ORDER — DOXAZOSIN MESYLATE 8 MG PO TABS
4.0000 mg | ORAL_TABLET | Freq: Every day | ORAL | Status: DC
  Administered 2021-01-16 – 2021-01-18 (×3): 4 mg via ORAL
  Filled 2021-01-15 (×3): qty 1

## 2021-01-15 MED ORDER — ACETAMINOPHEN 325 MG PO TABS
650.0000 mg | ORAL_TABLET | Freq: Four times a day (QID) | ORAL | Status: DC | PRN
  Administered 2021-01-16 – 2021-01-24 (×11): 650 mg via ORAL
  Filled 2021-01-15 (×11): qty 2

## 2021-01-15 MED ORDER — FUROSEMIDE 10 MG/ML IJ SOLN
40.0000 mg | Freq: Two times a day (BID) | INTRAMUSCULAR | Status: DC
  Administered 2021-01-16: 40 mg via INTRAVENOUS
  Filled 2021-01-15: qty 4

## 2021-01-15 NOTE — H&P (Signed)
History and Physical    LEVI CRASS GMW:[id number] DOB: 1936-06-14 DOA: 01/12/2021  PCP: [name], NP  Patient coming from: Home via EMS  I have personally briefly reviewed patient's old medical records in [place]  Chief Complaint: Shortness of breath  HPI: BETHZY HAUCK is a 85 y.o. female with medical history significant for chronic systolic CHF (EF improved from 25-30% to 40-45% on last TTE 11/07/2019), chronic respiratory failure with hypoxia on 2 L supplemental O2 via [place] at all times, paroxysmal atrial fibrillation on amiodarone, COPD, a s/p TAVR, CKD stage IV, T2DM, HTN, hypothyroidism, HLD, LBBB, and OHS not on BiPAP or CPAP who presents to the ED for evaluation of shortness of breath.  Patient reports several days of worsening dyspnea on exertion, lower extremity edema.  She says she has been having to increase her home O2 from 2 L to 4 L via [place].  She has not been able to walk around at home due to her dyspnea.  She has felt as if her heart has been racing.  She denies any chest pain.  She reports good urine output without dysuria.  She reports adherence to Lasix.  She denies any nausea, vomiting, abdominal pain.  ED Course:  Initial vitals showed BP 131/77, pulse 111, RR 28, temp 97.7 F, SPO2 95% on 2 L supplemental O2 via [place].  Labs show sodium 137, potassium 5.7 (hemolyzed specimen), bicarb 31, BUN 60, creatinine 2.18, serum glucose 223, LFTs within normal limits, BNP 1681.6, high-sensitivity troponin I 35, WBC 5.8, hemoglobin 10.2, platelets 150,000.  SARS-CoV-2 negative.  Portable chest x-ray shows pulmonary edema with bilateral trace pleural effusions, cardiomegaly.  Patient was given IV Lasix 40 mg once and the hospitalist service was consulted to admit for further evaluation and management.  Review of Systems: All systems reviewed and are negative except as documented in history of present illness above.   Past Medical History:  Diagnosis Date  .  Allergy   . Anemia, unspecified   . Arthritis   . Asthma   . Atrial fibrillation status post cardioversion [facility]) 09/2015   s/p TEE/DCCV>>SR on amio  . Benign neoplasm of colon   . Carpal tunnel syndrome   . Cellulitis and abscess of finger, unspecified   . Cerumen impaction   . Cervicalgia   . CHF (congestive heart failure) ([place])   . Chronic airway obstruction, not elsewhere classified   . Chronic anticoagulation 09/2015   Eliquis  . Diarrhea   . Diffuse cystic mastopathy   . Edema   . Encounter for long-term (current) use of other medications   . GERD (gastroesophageal reflux disease)   . Heart murmur   . Lumbago   . Neck pain 05/23/2015  . Obesity, unspecified   . Other and unspecified hyperlipidemia   . Other psoriasis   . Pain in joint, lower leg   . PONV (postoperative nausea and vomiting)   . Primary localized osteoarthrosis of right shoulder 12/18/2015  . 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 Scoliosis (and kyphoscoliosis), idiopathic   . Shortness of breath dyspnea    with exertion  . Type II or unspecified type diabetes mellitus without mention of complication, uncontrolled   . Unspecified essential hypertension   . Unspecified hemorrhoids without mention of complication   . Unspecified hypothyroidism   . Urge incontinence     Past  Surgical History:  Procedure Laterality Date  . ABDOMINAL HYSTERECTOMY  1987   complete  . BREAST BIOPSY Right   . BREAST EXCISIONAL BIOPSY Left 2011  . BREAST EXCISIONAL BIOPSY Right    x2  . BREAST SURGERY     right breast 3 surgeries [id number]  . CARDIAC CATHETERIZATION N/A 09/02/2016   Procedure: Right/Left Heart Cath and Coronary Angiography;  Surgeon: [name] M Martinique, MD;  Location: [place] [facility];  Service: Cardiovascular;  Laterality: N/A;  . CARDIOVERSION N/A 10/19/2015   Procedure:  CARDIOVERSION;  Surgeon: Lelon Perla, MD;  Location: [facility] ENDOSCOPY;  Service: Cardiovascular;  Laterality: N/A;  . CARDIOVERSION N/A 11/13/2017   Procedure: CARDIOVERSION;  Surgeon: Jerline Pain, MD;  Location: [facility] ENDOSCOPY;  Service: Cardiovascular;  Laterality: N/A;  . CARDIOVERSION N/A 07/28/2019   Procedure: CARDIOVERSION;  Surgeon: Buford Dresser, MD;  Location: [place];  Service: Cardiovascular;  Laterality: N/A;  . [place]  . COLON SURGERY     2004,2007,2013.  3 times colon surgieres  . ESOPHAGOGASTRODUODENOSCOPY (EGD) WITH PROPOFOL Left 03/13/2017   Procedure: ESOPHAGOGASTRODUODENOSCOPY (EGD) WITH PROPOFOL;  Surgeon: Ronnette Juniper, MD;  Location: [place];  Service: Gastroenterology;  Laterality: Left;  . ESOPHAGOGASTRODUODENOSCOPY (EGD) WITH PROPOFOL Left 01/22/2018   Procedure: ESOPHAGOGASTRODUODENOSCOPY (EGD) WITH PROPOFOL;  Surgeon: Arta Silence, MD;  Location: [facility] ENDOSCOPY;  Service: Endoscopy;  Laterality: Left;  . EXCISE LE MANDIBULAR LYMPH NODE T     DR C. NEWMAN  . FOOT SURGERY  1989  . GIVENS CAPSULE STUDY Left 01/23/2018   Procedure: GIVENS CAPSULE STUDY;  Surgeon: Wilford Corner, MD;  Location: [facility] ENDOSCOPY;  Service: Endoscopy;  Laterality: Left;  . LEFT SHOULDER ARTHROSCOPY    . 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 NEUROPLASTY / TRANSPOSITION MEDIAN NERVE AT CARPAL TUNNEL BILATERAL  05/2003  . TEE WITHOUT CARDIOVERSION N/A 10/19/2015   Procedure: Transesophageal Echocardiogram (TEE) ;  Surgeon: Lelon Perla, MD;  Location: [facility] ENDOSCOPY;  Service: Cardiovascular;  Laterality: N/A;  . TEE WITHOUT CARDIOVERSION N/A 10/14/2016   Procedure: TRANSESOPHAGEAL ECHOCARDIOGRAM (TEE);  Surgeon: Sherren Mocha, MD;  Location: [place];  Service: Open Heart Surgery;  Laterality: N/A;  . TEE WITHOUT CARDIOVERSION N/A 07/28/2019   Procedure: TRANSESOPHAGEAL ECHOCARDIOGRAM (TEE);  Surgeon: Buford Dresser, MD;  Location: [facility] ENDOSCOPY;   Service: Cardiovascular;  Laterality: N/A;  . TOTAL SHOULDER ARTHROPLASTY Right 12/18/2015   Procedure: TOTAL SHOULDER ARTHROPLASTY;  Surgeon: [name], MD;  Location: [place];  Service: Orthopedics;  Laterality: Right;  . TRANSCATHETER AORTIC VALVE REPLACEMENT, TRANSFEMORAL N/A 10/14/2016   Procedure: TRANSCATHETER AORTIC VALVE REPLACEMENT, TRANSFEMORAL;  Surgeon: Sherren Mocha, MD;  Location: [place];  Service: Open Heart Surgery;  Laterality: N/A;  . TRIGGER FINGER RELEASE Left   . VAGINAL CYST REMOVED  1967    Social History:  reports that she quit smoking about 31 years ago. She has a 40.00 pack-year smoking history. She has never used smokeless tobacco. She reports that she does not drink alcohol and does not use drugs.  Allergies  Allergen Reactions  . Codeine Other (See Comments)    "Spaces out" the patient and she cannot walk well  . Vesicare [Solifenacin] Itching    Family History  Problem Relation Age of Onset  . Diabetes Father   . Stroke Father   . Heart disease Father   . Liver cancer Sister   . Heart disease Brother   . Asthma Sister   . Arthritis Sister   .  Breast cancer Neg Hx      Prior to Admission medications   Medication Sig Start Date End Date Taking? Authorizing Provider  acetaminophen (TYLENOL) 650 MG CR tablet Take 1,300 mg by mouth every 8 (eight) hours as needed for pain.    [provider]  albuterol (PROVENTIL) (2.5 MG/3ML) 0.083% nebulizer solution USE 1 VIAL IN NEBULIZER EVERY 4 HOURS AND AS NEEDED. Generic: VENTOLIN 07/12/20   Brand Males, MD  albuterol (VENTOLIN HFA) 108 (90 Base) MCG/ACT inhaler INHALE 2 PUFFS INTO THE LUNGS EVERY 6 HOURS AS NEEDED FOR WHEEZING OR SHORTNESS OF BREATH 10/10/20   [name], NP  amiodarone (PACERONE) 200 MG tablet Take 1 tablet (200 mg total) by mouth daily. 06/29/20   Martinique, [name] M, MD  azelastine (ASTELIN) 0.1 % nasal spray Place 1 spray into both nostrils 2 (two) times daily. Use in each  nostril as directed Patient taking differently: Place 1 spray into both nostrils 2 (two) times daily as needed. Use in each nostril as directed 03/02/20   Martyn Ehrich, NP  Blood Glucose Monitoring Suppl [facility] VERIO) w/Device KIT Check blood sugar twice daily DX:E11.9 10/12/19   [name], NP  cetirizine (ZYRTEC) 10 MG tablet Take 10 mg by mouth daily as needed for allergies.     [provider]  cholecalciferol (VITAMIN D) 1000 units tablet Take 1,000 Units by mouth at bedtime.     [provider]  colestipol (COLESTID) 1 g tablet Take 1 g by mouth 2 (two) times daily. 06/29/20   [provider]  Dextromethorphan-guaiFENesin (MUCINEX DM) 30-600 MG TB12 Take 2 tablets by mouth as needed.    [provider]  doxazosin (CARDURA) 4 MG tablet TAKE 1 TABLET BY MOUTH EVERY DAY TO HELP CONTROL BLOOD PRESSURE 09/12/20   [name], NP  ferrous sulfate 325 (65 FE) MG EC tablet Take 325 mg by mouth 2 (two) times daily.    [provider]  furosemide (LASIX) 40 MG tablet TAKE 2 TABLETS BY MOUTH DAILY AND 1 TABLET BY MOUTH AT LUNCH on  Mondays, Wednesdays and Fridays Patient taking differently: TAKE 2 TABLETS BY MOUTH DAILY AND   Mon/ Wed/ Fri, 2 tablets in the morning , and 1 TABLET BY MOUTH AT LUNCH 09/17/20   [name], NP  glimepiride (AMARYL) 1 MG tablet TAKE 1 TABLET(1 MG) BY MOUTH DAILY WITH BREAKFAST 11/19/20   [name], NP  glucose blood test strip 1 each by Other route 2 (two) times daily. OneTouch Verio DX E11.8 10/12/19   [name], NP  JANUVIA 50 MG tablet TAKE 1 TABLET(50 MG) BY MOUTH DAILY 10/10/20   [name], NP  Lancets [facility] ULTRASOFT) lancets 1 each by Other route 2 (two) times daily. OneTouch Verio DX E11.9 10/12/19   [name], NP  levothyroxine (SYNTHROID) 88 MCG tablet TAKE 1 TABLET BY MOUTH DAILY ON AN EMPTY STOMACH 08/29/20   [name], NP  OXYGEN Inhale 2 L into  the lungs continuous. Pt also uses 4L pulse via POC    [provider]  pantoprazole (PROTONIX) 40 MG tablet TAKE 1 TABLET(40 MG) BY MOUTH TWICE DAILY 07/11/20   [name], NP  potassium gluconate 595 (99 K) MG TABS tablet Take 595 mg by mouth 2 (two) times daily.    [provider]  predniSONE (DELTASONE) 10 MG tablet Take 4 tabs po daily x 3 days; then 3 tabs daily  x3 days; then 2 tabs daily x3 days; then 1 tab daily x 3 days; then stop 01/14/21   Martyn Ehrich, NP  rosuvastatin (CRESTOR) 10 MG tablet TAKE 1 TABLET(10 MG) BY MOUTH DAILY 10/31/20   [name], NP  TRELEGY ELLIPTA 200-62.5-25 MCG/INH AEPB INHALE 1 PUFF INTO THE LUNGS DAILY 10/15/20   Martyn Ehrich, NP    Physical Exam: Vitals:   01/16/2021 2100 01/07/2021 2115 01/17/2021 2130 01/07/2021 2200  BP: 123/81 124/75 (!) 114/50 124/63  Pulse: (!) 102 97 (!) 104 98  Resp: (!) 24 (!) 27 (!) 25 18  Temp:      TempSrc:      SpO2: 98% 99% 100% 99%   Constitutional: Obese woman resting in bed with head elevated, NAD, calm, comfortable Eyes: PERRL, lids and conjunctivae normal ENMT: Mucous membranes are moist. Posterior pharynx clear of any exudate or lesions.Normal dentition.  Neck: normal, supple, no masses. Respiratory: Bibasilar inspiratory crackles.  Normal respiratory effort. No accessory muscle use.  Cardiovascular: Irregularly irregular, no murmurs / rubs / gallops.  +2 bilateral lower extremity edema. 2+ pedal pulses. Abdomen: no tenderness, no masses palpated. Bowel sounds positive.  Musculoskeletal: no clubbing / cyanosis. No joint deformity upper and lower extremities. Good ROM, no contractures. Normal muscle tone.  Skin: no rashes, lesions, ulcers. No induration Neurologic: CN 2-12 grossly intact. Sensation intact. Strength 5/5 in all 4.  Psychiatric: Normal judgment and insight. Alert and oriented x 3. Normal mood.   Labs on Admission: I have personally reviewed following labs and  imaging studies  CBC: Recent Labs  Lab 12/26/2020 1904  WBC 5.8  NEUTROABS 5.3  HGB 10.2*  HCT 34.2*  MCV 95.3  PLT 704   Basic Metabolic Panel: Recent Labs  Lab 01/01/2021 1904  NA 137  K 5.7*  CL 97*  CO2 31  GLUCOSE 223*  BUN 60*  CREATININE 2.18*  CALCIUM 9.4   GFR: CrCl cannot be calculated (Unknown ideal weight.). Liver Function Tests: Recent Labs  Lab 01/11/2021 1904  AST 29  ALT 18  ALKPHOS 55  BILITOT 0.7  PROT 7.1  ALBUMIN 3.8   No results for input(s): LIPASE, AMYLASE in the last 168 hours. No results for input(s): AMMONIA in the last 168 hours. Coagulation Profile: No results for input(s): INR, PROTIME in the last 168 hours. Cardiac Enzymes: No results for input(s): CKTOTAL, CKMB, CKMBINDEX, TROPONINI in the last 168 hours. BNP (last 3 results) No results for input(s): PROBNP in the last 8760 hours. HbA1C: No results for input(s): HGBA1C in the last 72 hours. CBG: No results for input(s): GLUCAP in the last 168 hours. Lipid Profile: No results for input(s): CHOL, HDL, LDLCALC, TRIG, CHOLHDL, LDLDIRECT in the last 72 hours. Thyroid Function Tests: No results for input(s): TSH, T4TOTAL, FREET4, T3FREE, THYROIDAB in the last 72 hours. Anemia Panel: No results for input(s): VITAMINB12, FOLATE, FERRITIN, TIBC, IRON, RETICCTPCT in the last 72 hours. Urine analysis:    Component Value Date/Time   COLORURINE YELLOW 12/17/2020 0022   APPEARANCEUR CLEAR 12/17/2020 0022   LABSPEC 1.011 12/17/2020 0022   PHURINE 6.0 12/17/2020 0022   GLUCOSEU NEGATIVE 12/17/2020 0022   HGBUR NEGATIVE 12/17/2020 0022   BILIRUBINUR NEGATIVE 12/17/2020 0022   BILIRUBINUR negative 07/18/2015 1337   BILIRUBINUR small 10/07/2012 1544   KETONESUR NEGATIVE 12/17/2020 0022   PROTEINUR 30 (A) 12/17/2020 0022   UROBILINOGEN 0.2 07/18/2015 1337   NITRITE NEGATIVE 12/17/2020 0022   LEUKOCYTESUR NEGATIVE 12/17/2020 0022  Radiological Exams on Admission: DG Chest Portable 1  View  Result Date: 01/14/2021 CLINICAL DATA:  Shortness of breath. EXAM: PORTABLE CHEST 1 VIEW COMPARISON:  Chest x-ray 12/17/2020, CT chest 05/31/2020 FINDINGS: Cardiomegaly. The heart size and mediastinal contours are unchanged. Aortic arch calcifications. Aortic valve replacement. Prominence of the hilar vasculature. Biapical pleural/pulmonary scarring. Streaky airspace opacities at the right lower lobe. No pulmonary edema. Bilateral trace to small volume pleural effusions. No pneumothorax. No acute osseous abnormality. Partially visualized right shoulder arthroplasty. IMPRESSION: 1. Pulmonary edema with bilateral trace to small volume pleural effusion. Superimposed infection/inflammation not excluded. 2. Cardiomegaly. Electronically Signed   By: Iven Finn M.D.   On: 01/12/2021 20:48    EKG: Pending, telemetry shows A. fib, rate 100-110, LBBB.  Assessment/Plan Principal Problem:   Acute on chronic systolic (congestive) heart failure (HCC) Active Problems:   Hypertension associated with diabetes ([place])   DM type 2 (diabetes mellitus, type 2) (HCC)   Hypothyroidism   COPD (chronic obstructive pulmonary disease) (HCC)   Paroxysmal atrial fibrillation (HCC)   Acute on chronic respiratory failure with hypoxia (HCC)   S/P TAVR (transcatheter aortic valve replacement)   Obesity hypoventilation syndrome (HCC)   Hyperlipidemia associated with type 2 diabetes mellitus (HCC)   SHEANNA DAIL is a 85 y.o. female with medical history significant for chronic systolic CHF (EF improved from 25-30% to 40-45% on last TTE 11/07/2019), chronic respiratory failure with hypoxia on 2 L supplemental O2 via [place] at all times, paroxysmal atrial fibrillation on amiodarone, COPD, a s/p TAVR, CKD stage IV, T2DM, HTN, hypothyroidism, HLD, LBBB, and OHS not on BiPAP or CPAP who is admitted with acute on chronic systolic CHF.  Acute on chronic systolic CHF: Presented with orthopnea, DOE, peripheral edema.  Last EF  40-45% 11/07/2019.  BNP >1600.  Troponin minimally elevated likely due to demand ischemia. -Continue IV Lasix 40 mg twice daily -Monitor strict I/O's and daily weights -Update echocardiogram  Acute on chronic respiratory failure with hypoxia: Uses 2 L O2 via [place] at all times, increased requirement up to 4 L on ED arrival, down to 3 L.  Secondary to acute on chronic CHF.  Continue management as above.  AKI on CKD stage IV: Suspect cardiorenal.  Continue diuresis and monitor.  COPD: Stable without active wheezing.  Continue Trelegy and albuterol as needed.  Paroxysmal atrial fibrillation: Back in atrial fibrillation upon admission.  Continue amiodarone.  CHA2DS2-VASc score is 6.  She says she is not on anticoagulation due to history of GI bleed.  Type 2 diabetes: Hold home Januvia and Amaryl for now.  Start SSI with HS coverage.  Hypertension: Continue doxazosin.  Aortic stenosis s/p TAVR: Repeat echo.  Hyperlipidemia: Continue rosuvastatin.  Hypothyroidism: Continue Synthroid.  Anemia of chronic kidney disease: Chronic and stable without obvious bleeding.  Obesity hypoventilation syndrome: Outpatient sleep study was recommended, not completed and not currently on CPAP or BiPAP as an outpatient.   DVT prophylaxis: Subcutaneous heparin Code Status: Full code, confirmed with patient Family Communication: Discussed with patient, she has discussed with family Disposition Plan: From home and likely discharged home pending adequate diuresis Consults called: None Level of care: Telemetry Cardiac Admission status:  Status is: Observation  The patient remains OBS appropriate and will d/c before 2 midnights.  Dispo: The patient is from: Home              Anticipated d/c is to: Home  Patient currently is not medically stable to d/c.   Difficult to place patient No   Zada Finders MD [facility]  If 7PM-7AM, please contact  night-coverage [email or web address]  12/30/2020, 10:33 PM

## 2021-01-15 NOTE — ED Provider Notes (Signed)
Emergency Department Provider Note   I have reviewed the triage vital signs and the nursing notes.   HISTORY  Chief Complaint Shortness of Breath   HPI Patricia [name] is a 85 y.o. female with complicated past medical history reviewed below including congestive heart failure with EF of approximately 40, status post TAVR, COPD, and elevated BMI   presents to the emergency department for evaluation of increased shortness of breath, edema, fatigue.  Patient states that she has had significant difficulty getting around her house despite being compliant with her home oxygen.  It has caused her to miss some of her medications.  She denies any chest pains or fevers.  She has not been able to get up and walk around to weigh herself to know if she is gained weight but suspects that she has due to increased swelling in the legs and abdomen.  She ultimately called EMS for assistance and they found her to be hypoxemic at 88% on 2 L with some increased work of breathing. No radiation of symptoms or modifying factors.   Past Medical History:  Diagnosis Date  . Allergy   . Anemia, unspecified   . Arthritis   . Asthma   . Atrial fibrillation status post cardioversion [facility]) 09/2015   s/p TEE/DCCV>>SR on amio  . Benign neoplasm of colon   . Carpal tunnel syndrome   . Cellulitis and abscess of finger, unspecified   . Cerumen impaction   . Cervicalgia   . CHF (congestive heart failure) ([place])   . Chronic airway obstruction, not elsewhere classified   . Chronic anticoagulation 09/2015   Eliquis  . Diarrhea   . Diffuse cystic mastopathy   . Edema   . Encounter for Tyeisha Dinan-term (current) use of other medications   . GERD (gastroesophageal reflux disease)   . Heart murmur   . Lumbago   . Neck pain 05/23/2015  . Obesity, unspecified   . Other and unspecified hyperlipidemia   . Other psoriasis   . Pain in joint, lower leg   . PONV (postoperative nausea and vomiting)   . Primary localized  osteoarthrosis of right shoulder 12/18/2015  . 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 Scoliosis (and kyphoscoliosis), idiopathic   . Shortness of breath dyspnea    with exertion  . Type II or unspecified type diabetes mellitus without mention of complication, uncontrolled   . Unspecified essential hypertension   . Unspecified hemorrhoids without mention of complication   . Unspecified hypothyroidism   . Urge incontinence     Patient Active Problem List   Diagnosis Date Noted  . CHF exacerbation ([place]) 01/16/2021  . Acute on chronic systolic (congestive) heart failure ([place]) 12/26/2020  . Hyperlipidemia associated with type 2 diabetes mellitus ([place]) 01/06/2021  . Abnormal CT of the chest 06/17/2020  . Chronic respiratory failure with hypoxia ([place]) 03/02/2020  . Rhinitis 03/02/2020  . Pressure injury of skin 07/26/2019  . Dyspnea 07/26/2019  . Acute on chronic diastolic (congestive) heart failure ([place]) 07/25/2019  . Acute diastolic CHF (congestive heart failure) ([place]) 07/25/2019  . COPD with acute exacerbation ([place]) 03/17/2019  . GI bleed 01/20/2018  . UGIB (upper gastrointestinal bleed) 03/11/2017  . Lung nodule 01/26/2017  . Macrocytosis 01/20/2017  . Hypoxemia 10/24/2016  . Asthma 10/24/2016  . Hypersomnia 10/24/2016  . Obesity hypoventilation syndrome ([place]) 10/24/2016  . S/P TAVR (transcatheter aortic valve  replacement) 10/14/2016  . Acute on chronic respiratory failure with hypoxia ([place])   . Aortic valve stenosis 09/11/2016  . Acute blood loss anemia 02/06/2016  . Acute on chronic diastolic heart failure ([place]) 02/06/2016  . Paroxysmal atrial fibrillation ([place]) 02/05/2016  . Primary localized osteoarthrosis of right shoulder 12/18/2015  . Osteoarthritis of right shoulder 12/18/2015  . Post-nasal drainage 11/27/2015  . Anemia 09/26/2015  . Neck pain  05/23/2015  . Knee pain, bilateral 07/20/2014  . Lumbago 11/02/2013  . Trigger finger, acquired 11/02/2013  . Hyperlipidemia 11/02/2013  . Urinary incontinence 11/02/2013  . Rash and nonspecific skin eruption 11/02/2013  . COPD (chronic obstructive pulmonary disease) ([place])   . Hypothyroidism   . Hypertension associated with diabetes ([place]) 03/02/2013  . DM type 2 (diabetes mellitus, type 2) ([place]) 03/02/2013    Past Surgical History:  Procedure Laterality Date  . ABDOMINAL HYSTERECTOMY  1987   complete  . BREAST BIOPSY Right   . BREAST EXCISIONAL BIOPSY Left 2011  . BREAST EXCISIONAL BIOPSY Right    x2  . BREAST SURGERY     right breast 3 surgeries [phone]  . CARDIAC CATHETERIZATION N/A 09/02/2016   Procedure: Right/Left Heart Cath and Coronary Angiography;  Surgeon: [name] M Martinique, MD;  Location: [place] CV LAB;  Service: Cardiovascular;  Laterality: N/A;  . CARDIOVERSION N/A 10/19/2015   Procedure: CARDIOVERSION;  Surgeon: Lelon Perla, MD;  Location: [facility] ENDOSCOPY;  Service: Cardiovascular;  Laterality: N/A;  . CARDIOVERSION N/A 11/13/2017   Procedure: CARDIOVERSION;  Surgeon: Jerline Pain, MD;  Location: [facility] ENDOSCOPY;  Service: Cardiovascular;  Laterality: N/A;  . CARDIOVERSION N/A 07/28/2019   Procedure: CARDIOVERSION;  Surgeon: Buford Dresser, MD;  Location: [place];  Service: Cardiovascular;  Laterality: N/A;  . [place]  . COLON SURGERY     2004,2007,2013.  3 times colon surgieres  . ESOPHAGOGASTRODUODENOSCOPY (EGD) WITH PROPOFOL Left 03/13/2017   Procedure: ESOPHAGOGASTRODUODENOSCOPY (EGD) WITH PROPOFOL;  Surgeon: Ronnette Juniper, MD;  Location: [place];  Service: Gastroenterology;  Laterality: Left;  . ESOPHAGOGASTRODUODENOSCOPY (EGD) WITH PROPOFOL Left 01/22/2018   Procedure: ESOPHAGOGASTRODUODENOSCOPY (EGD) WITH PROPOFOL;  Surgeon: Arta Silence, MD;  Location: [facility] ENDOSCOPY;  Service: Endoscopy;  Laterality: Left;  .  EXCISE LE MANDIBULAR LYMPH NODE T     DR C. NEWMAN  . FOOT SURGERY  1989  . GIVENS CAPSULE STUDY Left 01/23/2018   Procedure: GIVENS CAPSULE STUDY;  Surgeon: Wilford Corner, MD;  Location: [facility] ENDOSCOPY;  Service: Endoscopy;  Laterality: Left;  . LEFT SHOULDER ARTHROSCOPY    . 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 NEUROPLASTY / TRANSPOSITION MEDIAN NERVE AT CARPAL TUNNEL BILATERAL  05/2003  . TEE WITHOUT CARDIOVERSION N/A 10/19/2015   Procedure: Transesophageal Echocardiogram (TEE) ;  Surgeon: Lelon Perla, MD;  Location: [facility] ENDOSCOPY;  Service: Cardiovascular;  Laterality: N/A;  . TEE WITHOUT CARDIOVERSION N/A 10/14/2016   Procedure: TRANSESOPHAGEAL ECHOCARDIOGRAM (TEE);  Surgeon: Sherren Mocha, MD;  Location: [place];  Service: Open Heart Surgery;  Laterality: N/A;  . TEE WITHOUT CARDIOVERSION N/A 07/28/2019   Procedure: TRANSESOPHAGEAL ECHOCARDIOGRAM (TEE);  Surgeon: Buford Dresser, MD;  Location: [facility] ENDOSCOPY;  Service: Cardiovascular;  Laterality: N/A;  . TOTAL SHOULDER ARTHROPLASTY Right 12/18/2015   Procedure: TOTAL SHOULDER ARTHROPLASTY;  Surgeon: [name], MD;  Location: [place];  Service: Orthopedics;  Laterality: Right;  . TRANSCATHETER AORTIC VALVE REPLACEMENT, TRANSFEMORAL N/A 10/14/2016   Procedure: TRANSCATHETER AORTIC VALVE REPLACEMENT, TRANSFEMORAL;  Surgeon: Sherren Mocha, MD;  Location:  [place] OR;  Service: Open Heart Surgery;  Laterality: N/A;  . TRIGGER FINGER RELEASE Left   . VAGINAL CYST REMOVED  1967    Allergies Codeine and Vesicare [solifenacin]  Family History  Problem Relation Age of Onset  . Diabetes Father   . Stroke Father   . Heart disease Father   . Liver cancer Sister   . Heart disease Brother   . Asthma Sister   . Arthritis Sister   . Breast cancer Neg Hx     Social History Social History   Tobacco Use  . Smoking status: Former Smoker    Packs/day: 1.00    Years: 40.00    Pack years: 40.00    Quit date: 09/07/1989    Years since  quitting: 31.3  . Smokeless tobacco: Never Used  Vaping Use  . Vaping Use: Never used  Substance Use Topics  . Alcohol use: No    Alcohol/week: 0.0 standard drinks  . Drug use: No    Review of Systems  Constitutional: No fever/chills. Positive fatigue.  Eyes: No visual changes. ENT: No sore throat. Cardiovascular: Denies chest pain. Positive leg swelling.  Respiratory: Positive shortness of breath.  Gastrointestinal: No abdominal pain.  No nausea, no vomiting.  No diarrhea.  No constipation. Genitourinary: Negative for dysuria. Musculoskeletal: Negative for back pain. Skin: Negative for rash. Neurological: Negative for headaches, focal weakness or numbness.  10-point ROS otherwise negative.  ____________________________________________   PHYSICAL EXAM:  VITAL SIGNS: ED Triage Vitals  Enc Vitals Group     BP 01/10/2021 1851 (!) 128/101     Pulse Rate 12/27/2020 1851 (!) 109     Resp 01/19/2021 1851 (!) 32     Temp 01/23/2021 1851 97.7 F (36.5 C)     Temp Source 12/29/2020 1851 Oral     SpO2 12/26/2020 1846 94 %   Constitutional: Alert and oriented. Patient with moderate increased WOB on arrival.  Eyes: Conjunctivae are normal.  Head: Atraumatic. Nose: No congestion/rhinnorhea. Mouth/Throat: Mucous membranes are moist.  Neck: No stridor.   Cardiovascular: Normal rate, regular rhythm. Good peripheral circulation. Grossly normal heart sounds.   Respiratory: Increased respiratory effort.  No retractions. Lungs with crackles at the bases. No wheezing.  Gastrointestinal: Soft and nontender. No distention.  Musculoskeletal: No lower extremity tenderness w/ 2+ pitting edema bilaterally. No gross deformities of extremities. Neurologic:  Normal speech and language. No gross focal neurologic deficits are appreciated.  Skin:  Skin is warm, dry and intact. No rash noted.  ____________________________________________   LABS (all labs ordered are listed, but only abnormal results are  displayed)  Labs Reviewed  COMPREHENSIVE METABOLIC PANEL - Abnormal; Notable for the following components:      Result Value   Potassium 5.7 (*)    Chloride 97 (*)    Glucose, Bld 223 (*)    BUN 60 (*)    Creatinine, Ser 2.18 (*)    GFR, Estimated 22 (*)    All other components within normal limits  BRAIN NATRIURETIC PEPTIDE - Abnormal; Notable for the following components:   B Natriuretic Peptide 1,681.6 (*)    All other components within normal limits  CBC WITH DIFFERENTIAL/PLATELET - Abnormal; Notable for the following components:   RBC 3.59 (*)    Hemoglobin 10.2 (*)    HCT 34.2 (*)    MCHC 29.8 (*)    RDW 16.1 (*)    Lymphs Abs 0.3 (*)    All other components within normal limits  BASIC  METABOLIC PANEL - Abnormal; Notable for the following components:   Chloride 97 (*)    Glucose, Bld 190 (*)    BUN 63 (*)    Creatinine, Ser 2.16 (*)    GFR, Estimated 22 (*)    All other components within normal limits  CBC - Abnormal; Notable for the following components:   RBC 3.25 (*)    Hemoglobin 9.4 (*)    HCT 31.6 (*)    MCHC 29.7 (*)    RDW 16.1 (*)    Platelets 138 (*)    nRBC 0.3 (*)    All other components within normal limits  HEMOGLOBIN A1C - Abnormal; Notable for the following components:   Hgb A1c MFr Bld 7.1 (*)    All other components within normal limits  CBG MONITORING, ED - Abnormal; Notable for the following components:   Glucose-Capillary 158 (*)    All other components within normal limits  CBG MONITORING, ED - Abnormal; Notable for the following components:   Glucose-Capillary 156 (*)    All other components within normal limits  TROPONIN I (HIGH SENSITIVITY) - Abnormal; Notable for the following components:   Troponin I (High Sensitivity) 35 (*)    All other components within normal limits  TROPONIN I (HIGH SENSITIVITY) - Abnormal; Notable for the following components:   Troponin I (High Sensitivity) 34 (*)    All other components within normal limits   RESP PANEL BY RT-PCR (FLU A&B, COVID) ARPGX2  POTASSIUM  MAGNESIUM  CBG MONITORING, ED   ____________________________________________  EKG   EKG Interpretation  Date/Time:  Wednesday January 16 2021 08:35:47 EDT Ventricular Rate:  118 PR Interval:    QRS Duration: 178 QT Interval:  396 QTC Calculation: 555 R Axis:   99 Text Interpretation: Atrial fibrillation Consider left ventricular hypertrophy Repol abnrm, global ischemia, diffuse leads Prolonged QT interval Confirmed by Nanda Quinton [phone]) on 01/16/2021 2:05:11 PM       ____________________________________________  RADIOLOGY  DG Chest Portable 1 View  Result Date: 01/10/2021 CLINICAL DATA:  Shortness of breath. EXAM: PORTABLE CHEST 1 VIEW COMPARISON:  Chest x-ray 12/17/2020, CT chest 05/31/2020 FINDINGS: Cardiomegaly. The heart size and mediastinal contours are unchanged. Aortic arch calcifications. Aortic valve replacement. Prominence of the hilar vasculature. Biapical pleural/pulmonary scarring. Streaky airspace opacities at the right lower lobe. No pulmonary edema. Bilateral trace to small volume pleural effusions. No pneumothorax. No acute osseous abnormality. Partially visualized right shoulder arthroplasty. IMPRESSION: 1. Pulmonary edema with bilateral trace to small volume pleural effusion. Superimposed infection/inflammation not excluded. 2. Cardiomegaly. Electronically Signed   By: Iven Finn M.D.   On: 01/06/2021 20:48    ____________________________________________   PROCEDURES  Procedure(s) performed:   .Critical Care Performed by: Margette Fast, MD Authorized by: Margette Fast, MD   Critical care provider statement:    Critical care time (minutes):  45   Critical care time was exclusive of:  Separately billable procedures and treating other patients and teaching time   Critical care was necessary to treat or prevent imminent or life-threatening deterioration of the following conditions:   Respiratory failure   Critical care was time spent personally by me on the following activities:  Discussions with consultants, evaluation of patient's response to treatment, examination of patient, ordering and performing treatments and interventions, ordering and review of laboratory studies, ordering and review of radiographic studies, pulse oximetry, re-evaluation of patient's condition, obtaining history from patient or surrogate, review of old charts, blood draw for specimens  and development of treatment plan with patient or surrogate   I assumed direction of critical care for this patient from another provider in my specialty: no     Care discussed with: admitting provider       ____________________________________________   INITIAL IMPRESSION / ASSESSMENT AND PLAN / ED COURSE  Pertinent labs & imaging results that were available during my care of the patient were reviewed by me and considered in my medical decision making (see chart for details).   Patient presents to the ED with increased weakness and SOB over the last week. No CP. Patient appears volume overloaded clinically. Doubt PNA. CXR consistent with pulmonary edema. No infiltrate. Doubt PE clinically with edema and increased weight gain.   Patient reassessed after increased O2. WOB improved. Lasix given. Plan for admit.   Discussed patient's case with [facility] to request admission. Patient and family (if present) updated with plan. Care transferred to [facility] service.  I reviewed all nursing notes, vitals, pertinent old records, EKGs, labs, imaging (as available). ____________________________________________  FINAL CLINICAL IMPRESSION(S) / ED DIAGNOSES  Final diagnoses:  Acute on chronic respiratory failure with hypoxia (HCC)  Acute on chronic congestive heart failure, unspecified heart failure type [facility])     MEDICATIONS GIVEN DURING THIS VISIT:  Medications  heparin injection 5,000 Units (5,000 Units Subcutaneous Not Given  01/16/21 0514)  furosemide (LASIX) injection 40 mg (40 mg Intravenous Given 01/16/21 0449)  sodium chloride flush (NS) 0.9 % injection 3 mL (3 mLs Intravenous Given 01/16/21 1029)  acetaminophen (TYLENOL) tablet 650 mg (650 mg Oral Given 01/16/21 0448)    Or  acetaminophen (TYLENOL) suppository 650 mg ( Rectal See Alternative 01/16/21 0448)  ondansetron (ZOFRAN) tablet 4 mg (has no administration in time range)    Or  ondansetron (ZOFRAN) injection 4 mg (has no administration in time range)  albuterol (PROVENTIL) (2.5 MG/3ML) 0.083% nebulizer solution 2.5 mg (has no administration in time range)  amiodarone (PACERONE) tablet 200 mg (200 mg Oral Given 01/16/21 0920)  doxazosin (CARDURA) tablet 4 mg (4 mg Oral Given 01/16/21 1027)  levothyroxine (SYNTHROID) tablet 88 mcg (88 mcg Oral Given 01/16/21 0537)  pantoprazole (PROTONIX) EC tablet 40 mg (40 mg Oral Given 01/16/21 0920)  rosuvastatin (CRESTOR) tablet 10 mg (10 mg Oral Given 01/16/21 0920)  insulin aspart (novoLOG) injection 0-9 Units (0 Units Subcutaneous Hold 01/16/21 1114)  insulin aspart (novoLOG) injection 0-5 Units (0 Units Subcutaneous Not Given 01/16/21 0056)  methocarbamol (ROBAXIN) tablet 500 mg (500 mg Oral Given 01/16/21 0920)  fluticasone furoate-vilanterol (BREO ELLIPTA) 200-25 MCG/INH 1 puff (1 puff Inhalation Patient Refused/Not Given 01/16/21 1030)    And  umeclidinium bromide (INCRUSE ELLIPTA) 62.5 MCG/INH 1 puff (1 puff Inhalation Given 01/16/21 1030)  furosemide (LASIX) injection 40 mg (40 mg Intravenous Given 12/29/2020 2154)    Note:  This document was prepared using Dragon voice recognition software and may include unintentional dictation errors.  Nanda Quinton, MD, [facility] Emergency Medicine    Shontavia Mickel, [facility], MD 01/16/21 [phone]

## 2021-01-15 NOTE — ED Triage Notes (Signed)
BIB EMS from home for increased SOB, dyspnea, pitting edema bilaterally, sats 88% on 2 l/min O2 home dose. Alert, oriented, unkn weight gain at home. Lives alone, has not been able to walk around or shower at home  x3 days.

## 2021-01-16 ENCOUNTER — Inpatient Hospital Stay (HOSPITAL_COMMUNITY): Payer: Medicare Other

## 2021-01-16 ENCOUNTER — Other Ambulatory Visit: Payer: Medicare Other

## 2021-01-16 ENCOUNTER — Ambulatory Visit: Payer: Medicare Other | Admitting: Nurse Practitioner

## 2021-01-16 DIAGNOSIS — Z953 Presence of xenogenic heart valve: Secondary | ICD-10-CM | POA: Diagnosis not present

## 2021-01-16 DIAGNOSIS — I517 Cardiomegaly: Secondary | ICD-10-CM | POA: Diagnosis not present

## 2021-01-16 DIAGNOSIS — I5023 Acute on chronic systolic (congestive) heart failure: Secondary | ICD-10-CM | POA: Diagnosis not present

## 2021-01-16 DIAGNOSIS — I2510 Atherosclerotic heart disease of native coronary artery without angina pectoris: Secondary | ICD-10-CM | POA: Diagnosis not present

## 2021-01-16 DIAGNOSIS — I4891 Unspecified atrial fibrillation: Secondary | ICD-10-CM | POA: Diagnosis not present

## 2021-01-16 DIAGNOSIS — K5900 Constipation, unspecified: Secondary | ICD-10-CM | POA: Diagnosis not present

## 2021-01-16 DIAGNOSIS — T8249XA Other complication of vascular dialysis catheter, initial encounter: Secondary | ICD-10-CM | POA: Diagnosis not present

## 2021-01-16 DIAGNOSIS — J9622 Acute and chronic respiratory failure with hypercapnia: Secondary | ICD-10-CM | POA: Diagnosis not present

## 2021-01-16 DIAGNOSIS — J811 Chronic pulmonary edema: Secondary | ICD-10-CM | POA: Diagnosis not present

## 2021-01-16 DIAGNOSIS — I5020 Unspecified systolic (congestive) heart failure: Secondary | ICD-10-CM | POA: Diagnosis not present

## 2021-01-16 DIAGNOSIS — E872 Acidosis: Secondary | ICD-10-CM | POA: Diagnosis not present

## 2021-01-16 DIAGNOSIS — N186 End stage renal disease: Secondary | ICD-10-CM | POA: Diagnosis not present

## 2021-01-16 DIAGNOSIS — G9349 Other encephalopathy: Secondary | ICD-10-CM | POA: Diagnosis not present

## 2021-01-16 DIAGNOSIS — E1129 Type 2 diabetes mellitus with other diabetic kidney complication: Secondary | ICD-10-CM | POA: Diagnosis not present

## 2021-01-16 DIAGNOSIS — I509 Heart failure, unspecified: Secondary | ICD-10-CM

## 2021-01-16 DIAGNOSIS — I5021 Acute systolic (congestive) heart failure: Secondary | ICD-10-CM | POA: Diagnosis not present

## 2021-01-16 DIAGNOSIS — Z96611 Presence of right artificial shoulder joint: Secondary | ICD-10-CM | POA: Diagnosis present

## 2021-01-16 DIAGNOSIS — J9601 Acute respiratory failure with hypoxia: Secondary | ICD-10-CM | POA: Diagnosis not present

## 2021-01-16 DIAGNOSIS — I4819 Other persistent atrial fibrillation: Secondary | ICD-10-CM | POA: Diagnosis present

## 2021-01-16 DIAGNOSIS — I152 Hypertension secondary to endocrine disorders: Secondary | ICD-10-CM

## 2021-01-16 DIAGNOSIS — I5031 Acute diastolic (congestive) heart failure: Secondary | ICD-10-CM | POA: Diagnosis not present

## 2021-01-16 DIAGNOSIS — I130 Hypertensive heart and chronic kidney disease with heart failure and stage 1 through stage 4 chronic kidney disease, or unspecified chronic kidney disease: Secondary | ICD-10-CM | POA: Diagnosis not present

## 2021-01-16 DIAGNOSIS — I2609 Other pulmonary embolism with acute cor pulmonale: Secondary | ICD-10-CM | POA: Diagnosis not present

## 2021-01-16 DIAGNOSIS — E1159 Type 2 diabetes mellitus with other circulatory complications: Secondary | ICD-10-CM

## 2021-01-16 DIAGNOSIS — Q211 Atrial septal defect: Secondary | ICD-10-CM | POA: Diagnosis not present

## 2021-01-16 DIAGNOSIS — I361 Nonrheumatic tricuspid (valve) insufficiency: Secondary | ICD-10-CM | POA: Diagnosis not present

## 2021-01-16 DIAGNOSIS — E039 Hypothyroidism, unspecified: Secondary | ICD-10-CM | POA: Diagnosis present

## 2021-01-16 DIAGNOSIS — J90 Pleural effusion, not elsewhere classified: Secondary | ICD-10-CM | POA: Diagnosis not present

## 2021-01-16 DIAGNOSIS — Y848 Other medical procedures as the cause of abnormal reaction of the patient, or of later complication, without mention of misadventure at the time of the procedure: Secondary | ICD-10-CM | POA: Diagnosis not present

## 2021-01-16 DIAGNOSIS — E871 Hypo-osmolality and hyponatremia: Secondary | ICD-10-CM | POA: Diagnosis not present

## 2021-01-16 DIAGNOSIS — N184 Chronic kidney disease, stage 4 (severe): Secondary | ICD-10-CM

## 2021-01-16 DIAGNOSIS — E662 Morbid (severe) obesity with alveolar hypoventilation: Secondary | ICD-10-CM | POA: Diagnosis not present

## 2021-01-16 DIAGNOSIS — I480 Paroxysmal atrial fibrillation: Secondary | ICD-10-CM | POA: Diagnosis not present

## 2021-01-16 DIAGNOSIS — Z66 Do not resuscitate: Secondary | ICD-10-CM | POA: Diagnosis not present

## 2021-01-16 DIAGNOSIS — Z833 Family history of diabetes mellitus: Secondary | ICD-10-CM | POA: Diagnosis not present

## 2021-01-16 DIAGNOSIS — I5030 Unspecified diastolic (congestive) heart failure: Secondary | ICD-10-CM | POA: Diagnosis not present

## 2021-01-16 DIAGNOSIS — E1165 Type 2 diabetes mellitus with hyperglycemia: Secondary | ICD-10-CM

## 2021-01-16 DIAGNOSIS — Z7189 Other specified counseling: Secondary | ICD-10-CM | POA: Diagnosis not present

## 2021-01-16 DIAGNOSIS — J9621 Acute and chronic respiratory failure with hypoxia: Secondary | ICD-10-CM | POA: Diagnosis not present

## 2021-01-16 DIAGNOSIS — D631 Anemia in chronic kidney disease: Secondary | ICD-10-CM | POA: Diagnosis not present

## 2021-01-16 DIAGNOSIS — I340 Nonrheumatic mitral (valve) insufficiency: Secondary | ICD-10-CM | POA: Diagnosis not present

## 2021-01-16 DIAGNOSIS — Z90710 Acquired absence of both cervix and uterus: Secondary | ICD-10-CM | POA: Diagnosis not present

## 2021-01-16 DIAGNOSIS — J449 Chronic obstructive pulmonary disease, unspecified: Secondary | ICD-10-CM | POA: Diagnosis present

## 2021-01-16 DIAGNOSIS — Z515 Encounter for palliative care: Secondary | ICD-10-CM | POA: Diagnosis not present

## 2021-01-16 DIAGNOSIS — Z6839 Body mass index (BMI) 39.0-39.9, adult: Secondary | ICD-10-CM | POA: Diagnosis not present

## 2021-01-16 DIAGNOSIS — R0602 Shortness of breath: Secondary | ICD-10-CM | POA: Diagnosis not present

## 2021-01-16 DIAGNOSIS — Z952 Presence of prosthetic heart valve: Secondary | ICD-10-CM

## 2021-01-16 DIAGNOSIS — N179 Acute kidney failure, unspecified: Secondary | ICD-10-CM | POA: Diagnosis not present

## 2021-01-16 DIAGNOSIS — Z20822 Contact with and (suspected) exposure to covid-19: Secondary | ICD-10-CM | POA: Diagnosis not present

## 2021-01-16 DIAGNOSIS — N1832 Chronic kidney disease, stage 3b: Secondary | ICD-10-CM | POA: Diagnosis not present

## 2021-01-16 DIAGNOSIS — I5043 Acute on chronic combined systolic (congestive) and diastolic (congestive) heart failure: Secondary | ICD-10-CM | POA: Diagnosis not present

## 2021-01-16 DIAGNOSIS — G9341 Metabolic encephalopathy: Secondary | ICD-10-CM | POA: Diagnosis not present

## 2021-01-16 DIAGNOSIS — I10 Essential (primary) hypertension: Secondary | ICD-10-CM | POA: Diagnosis not present

## 2021-01-16 DIAGNOSIS — E1122 Type 2 diabetes mellitus with diabetic chronic kidney disease: Secondary | ICD-10-CM | POA: Diagnosis present

## 2021-01-16 LAB — CBC
HCT: 31.6 % — ABNORMAL LOW (ref 36.0–46.0)
Hemoglobin: 9.4 g/dL — ABNORMAL LOW (ref 12.0–15.0)
MCH: 28.9 pg (ref 26.0–34.0)
MCHC: 29.7 g/dL — ABNORMAL LOW (ref 30.0–36.0)
MCV: 97.2 fL (ref 80.0–100.0)
Platelets: 138 10*3/uL — ABNORMAL LOW (ref 150–400)
RBC: 3.25 MIL/uL — ABNORMAL LOW (ref 3.87–5.11)
RDW: 16.1 % — ABNORMAL HIGH (ref 11.5–15.5)
WBC: 6 10*3/uL (ref 4.0–10.5)
nRBC: 0.3 % — ABNORMAL HIGH (ref 0.0–0.2)

## 2021-01-16 LAB — CBG MONITORING, ED
Glucose-Capillary: 156 mg/dL — ABNORMAL HIGH (ref 70–99)
Glucose-Capillary: 158 mg/dL — ABNORMAL HIGH (ref 70–99)
Glucose-Capillary: 86 mg/dL (ref 70–99)

## 2021-01-16 LAB — BASIC METABOLIC PANEL
Anion gap: 9 (ref 5–15)
BUN: 63 mg/dL — ABNORMAL HIGH (ref 8–23)
CO2: 31 mmol/L (ref 22–32)
Calcium: 9.2 mg/dL (ref 8.9–10.3)
Chloride: 97 mmol/L — ABNORMAL LOW (ref 98–111)
Creatinine, Ser: 2.16 mg/dL — ABNORMAL HIGH (ref 0.44–1.00)
GFR, Estimated: 22 mL/min — ABNORMAL LOW (ref >60)
Glucose, Bld: 190 mg/dL — ABNORMAL HIGH (ref 70–99)
Potassium: 4.6 mmol/L (ref 3.5–5.1)
Sodium: 137 mmol/L (ref 135–145)

## 2021-01-16 LAB — HEPATIC FUNCTION PANEL
ALT: 13 U/L (ref 0–44)
AST: 14 U/L — ABNORMAL LOW (ref 15–41)
Albumin: 3.4 g/dL — ABNORMAL LOW (ref 3.5–5.0)
Alkaline Phosphatase: 50 U/L (ref 38–126)
Bilirubin, Direct: 0.1 mg/dL (ref 0.0–0.2)
Indirect Bilirubin: 0.5 mg/dL (ref 0.3–0.9)
Total Bilirubin: 0.6 mg/dL (ref 0.3–1.2)
Total Protein: 6.4 g/dL — ABNORMAL LOW (ref 6.5–8.1)

## 2021-01-16 LAB — ECHOCARDIOGRAM COMPLETE
AR max vel: 1.08 cm2
AV Area VTI: 1.13 cm2
AV Area mean vel: 1 cm2
AV Mean grad: 12 mmHg
AV Peak grad: 17.1 mmHg
Ao pk vel: 2.07 m/s
Area-P 1/2: 6.27 cm2
Calc EF: 27 %
MV M vel: 4.62 m/s
MV Peak grad: 85.4 mmHg
Radius: 0.7 cm
S' Lateral: 3.4 cm
Single Plane A2C EF: 37.7 %
Single Plane A4C EF: 9 %

## 2021-01-16 LAB — TROPONIN I (HIGH SENSITIVITY): Troponin I (High Sensitivity): 34 ng/L — ABNORMAL HIGH (ref <18)

## 2021-01-16 LAB — MAGNESIUM: Magnesium: 1.9 mg/dL (ref 1.7–2.4)

## 2021-01-16 LAB — HEMOGLOBIN A1C
Hgb A1c MFr Bld: 7.1 % — ABNORMAL HIGH (ref 4.8–5.6)
Mean Plasma Glucose: 157.07 mg/dL

## 2021-01-16 LAB — GLUCOSE, CAPILLARY
Glucose-Capillary: 137 mg/dL — ABNORMAL HIGH (ref 70–99)
Glucose-Capillary: 151 mg/dL — ABNORMAL HIGH (ref 70–99)

## 2021-01-16 LAB — POTASSIUM: Potassium: 5 mmol/L (ref 3.5–5.1)

## 2021-01-16 LAB — TSH: TSH: 9.138 u[IU]/mL — ABNORMAL HIGH (ref 0.350–4.500)

## 2021-01-16 MED ORDER — FUROSEMIDE 10 MG/ML IJ SOLN
100.0000 mg | Freq: Once | INTRAVENOUS | Status: AC
  Administered 2021-01-16: 100 mg via INTRAVENOUS
  Filled 2021-01-16 (×2): qty 10

## 2021-01-16 MED ORDER — DM-GUAIFENESIN ER 30-600 MG PO TB12
2.0000 | ORAL_TABLET | Freq: Two times a day (BID) | ORAL | Status: DC | PRN
  Administered 2021-01-16 – 2021-01-17 (×2): 2 via ORAL
  Filled 2021-01-16: qty 1
  Filled 2021-01-16: qty 2

## 2021-01-16 MED ORDER — MUCINEX DM 30-600 MG PO TB12: 2.0000 | ORAL_TABLET | ORAL | Status: DC | PRN

## 2021-01-16 MED ORDER — AZELASTINE HCL 0.1 % NA SOLN
1.0000 | Freq: Two times a day (BID) | NASAL | Status: DC | PRN
  Filled 2021-01-16: qty 30

## 2021-01-16 MED ORDER — UMECLIDINIUM BROMIDE 62.5 MCG/INH IN AEPB
1.0000 | INHALATION_SPRAY | Freq: Every day | RESPIRATORY_TRACT | Status: DC
  Administered 2021-01-16 – 2021-01-23 (×7): 1 via RESPIRATORY_TRACT
  Filled 2021-01-16 (×2): qty 7

## 2021-01-16 MED ORDER — FLUTICASONE FUROATE-VILANTEROL 200-25 MCG/INH IN AEPB
1.0000 | INHALATION_SPRAY | Freq: Every day | RESPIRATORY_TRACT | Status: DC
  Administered 2021-01-18 – 2021-01-23 (×6): 1 via RESPIRATORY_TRACT
  Filled 2021-01-16: qty 28

## 2021-01-16 MED ORDER — METHOCARBAMOL 500 MG PO TABS
500.0000 mg | ORAL_TABLET | Freq: Four times a day (QID) | ORAL | Status: DC | PRN
  Administered 2021-01-16: 500 mg via ORAL
  Filled 2021-01-16: qty 1

## 2021-01-16 MED ORDER — FUROSEMIDE 10 MG/ML IJ SOLN: 40.0000 mg | Freq: Two times a day (BID) | INTRAMUSCULAR | Status: DC

## 2021-01-16 NOTE — Consult Note (Addendum)
Cardiology Consultation:   Patient ID: Patricia [name]; [id number]; April 18, 1936   Admit date: 01/11/2021 Date of Consult: 01/16/2021  Primary Care Provider: [name], NP Primary Cardiologist: Dr. Martinique, MD  Patient Profile:   Patricia [name] is a 85 y.o. [name] with a hx of paroxysmal atrial fibrillation with known LBBB>>not on anticoagulation due to recurrent GIB, HTN, HLD, AS s/p TAVR 7579, chronic systolic and diastolic CHF with LVEF at 40-45%, CKD stage IV, anemia, obesity, chronic respiratory failure on 2L supplemental O2 and DM2 who is being seen today for the evaluation of CHF at the request of Dr. Posey Pronto.  History of Present Illness:   Patricia [name] is an 85yo F with a hx as stated above who presented to [facility] 01/05/2021 with a several week hx of worsening SOB and LE edema. She states that she has been dealing with LE and abdominal edema for quite some time. Given her symptoms, she began watching her HR out of fear that she was back in AF. Rates at home were noted to be in the 90-120 range although she cannot determine if she was in fact in AF or not. She called our office on Friday to ask for recommendations. Dr. Martinique noted that she had been in NSR per EKG performed 11/2020 and he felt symptoms could possibly be from AF if she was in fact out of rhythm. He recommended that she make an appointment with an APP the following week for an in-person visit. Given that her symptoms persisted, she presented to the ED for further evaluation. She denies chest pain, palpitations, diaphoresis, N/V, recent illness with fever or chills.   In the ED, BNP found to be elevated at 1681 with CXR consistent with pulmonary edema with bilateral trace pleural effusions, cardiomegaly. Creatinine at 2.16. HsT at 35>>34 with flat trend>>not consisitent with ACS. COVID negative. She was given IV Lasix 24m x1 in the ED with response. IM medicine admitted with plan for IV diuretics at 490mBID. Echocardiogram was  ordered and performed however results are pending. EKG on admission showed AF with known LBBB with a rate of 118bpm.    She underwent TAVR 2017 after symptoms of worsening dyspnea. R/LHC at that time showed single vessel CAD with 75% OM1 disease. She has had issues with bleeding AVMs in the past requiring transfusions. She has been hospitalized on multiple occasions for CHF exacerbations over the years. She has also undergone several cardioversion procedures with most recent in TEE/DCCV 07/28/2019 with conversion to NSR. She was placed on anticoagulation with Eliquis 2.24m68mID for four weeks post conversion given profound anemia in the past. Echocardiogram from 06/2019 with an LVEF at 25-30% with LVH with diffuse hypokinesis with septal dyskinesis, severely dilated left atrium, mild mitral stenosis, and no significant aortic regurgitation. Most recent echo 11/07/2019 with improved LVEF to 40-45% with stable prothesis, moderate MR< mild mitral stenosis and moderate pulmonary HTN. She was seen in follow up with increased SOB therefore her Lasix was increased to 37m53m with 40mg23m for fluid volume overload.  She was last seen in follow up 06/2020 she had no improvement in symptoms however was not worse. There were no changes made to her regimen at that time.   Past Medical History:  Diagnosis Date  . Allergy   . Anemia, unspecified   . Arthritis   . Asthma   . Atrial fibrillation status post cardioversion (HCC)[facility]2016   s/p TEE/DCCV>>SR on amio  . Benign  neoplasm of colon   . Carpal tunnel syndrome   . Cellulitis and abscess of finger, unspecified   . Cerumen impaction   . Cervicalgia   . CHF (congestive heart failure) ([place])   . Chronic airway obstruction, not elsewhere classified   . Chronic anticoagulation 09/2015   Eliquis  . Diarrhea   . Diffuse cystic mastopathy   . Edema   . Encounter for long-term (current) use of other medications   . GERD (gastroesophageal reflux disease)   . Heart  murmur   . Lumbago   . Neck pain 05/23/2015  . Obesity, unspecified   . Other and unspecified hyperlipidemia   . Other psoriasis   . Pain in joint, lower leg   . PONV (postoperative nausea and vomiting)   . Primary localized osteoarthrosis of right shoulder 12/18/2015  . 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 Scoliosis (and kyphoscoliosis), idiopathic   . Shortness of breath dyspnea    with exertion  . Type II or unspecified type diabetes mellitus without mention of complication, uncontrolled   . Unspecified essential hypertension   . Unspecified hemorrhoids without mention of complication   . Unspecified hypothyroidism   . Urge incontinence     Past Surgical History:  Procedure Laterality Date  . ABDOMINAL HYSTERECTOMY  1987   complete  . BREAST BIOPSY Right   . BREAST EXCISIONAL BIOPSY Left 2011  . BREAST EXCISIONAL BIOPSY Right    x2  . BREAST SURGERY     right breast 3 surgeries [phone]  . CARDIAC CATHETERIZATION N/A 09/02/2016   Procedure: Right/Left Heart Cath and Coronary Angiography;  Surgeon: [name] M Martinique, MD;  Location: [place] CV LAB;  Service: Cardiovascular;  Laterality: N/A;  . CARDIOVERSION N/A 10/19/2015   Procedure: CARDIOVERSION;  Surgeon: Lelon Perla, MD;  Location: [facility] ENDOSCOPY;  Service: Cardiovascular;  Laterality: N/A;  . CARDIOVERSION N/A 11/13/2017   Procedure: CARDIOVERSION;  Surgeon: Jerline Pain, MD;  Location: [facility] ENDOSCOPY;  Service: Cardiovascular;  Laterality: N/A;  . CARDIOVERSION N/A 07/28/2019   Procedure: CARDIOVERSION;  Surgeon: Buford Dresser, MD;  Location: [place];  Service: Cardiovascular;  Laterality: N/A;  . [place]  . COLON SURGERY     2004,2007,2013.  3 times colon surgieres  . ESOPHAGOGASTRODUODENOSCOPY (EGD) WITH PROPOFOL Left 03/13/2017   Procedure:  ESOPHAGOGASTRODUODENOSCOPY (EGD) WITH PROPOFOL;  Surgeon: Ronnette Juniper, MD;  Location: [place];  Service: Gastroenterology;  Laterality: Left;  . ESOPHAGOGASTRODUODENOSCOPY (EGD) WITH PROPOFOL Left 01/22/2018   Procedure: ESOPHAGOGASTRODUODENOSCOPY (EGD) WITH PROPOFOL;  Surgeon: Arta Silence, MD;  Location: [facility] ENDOSCOPY;  Service: Endoscopy;  Laterality: Left;  . EXCISE LE MANDIBULAR LYMPH NODE T     DR C. NEWMAN  . FOOT SURGERY  1989  . GIVENS CAPSULE STUDY Left 01/23/2018   Procedure: GIVENS CAPSULE STUDY;  Surgeon: Wilford Corner, MD;  Location: [facility] ENDOSCOPY;  Service: Endoscopy;  Laterality: Left;  . LEFT SHOULDER ARTHROSCOPY    . 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 NEUROPLASTY / TRANSPOSITION MEDIAN NERVE AT CARPAL TUNNEL BILATERAL  05/2003  . TEE WITHOUT CARDIOVERSION N/A 10/19/2015   Procedure: Transesophageal Echocardiogram (TEE) ;  Surgeon: Lelon Perla, MD;  Location: [facility] ENDOSCOPY;  Service: Cardiovascular;  Laterality: N/A;  . TEE WITHOUT CARDIOVERSION N/A 10/14/2016   Procedure: TRANSESOPHAGEAL ECHOCARDIOGRAM (TEE);  Surgeon: Sherren Mocha, MD;  Location: [place];  Service:  Open Heart Surgery;  Laterality: N/A;  . TEE WITHOUT CARDIOVERSION N/A 07/28/2019   Procedure: TRANSESOPHAGEAL ECHOCARDIOGRAM (TEE);  Surgeon: Buford Dresser, MD;  Location: [facility] ENDOSCOPY;  Service: Cardiovascular;  Laterality: N/A;  . TOTAL SHOULDER ARTHROPLASTY Right 12/18/2015   Procedure: TOTAL SHOULDER ARTHROPLASTY;  Surgeon: [name], MD;  Location: [place];  Service: Orthopedics;  Laterality: Right;  . TRANSCATHETER AORTIC VALVE REPLACEMENT, TRANSFEMORAL N/A 10/14/2016   Procedure: TRANSCATHETER AORTIC VALVE REPLACEMENT, TRANSFEMORAL;  Surgeon: Sherren Mocha, MD;  Location: [place];  Service: Open Heart Surgery;  Laterality: N/A;  . TRIGGER FINGER RELEASE Left   . VAGINAL CYST REMOVED  1967     Prior to Admission medications   Medication Sig Start Date End Date Taking? Authorizing  Provider  acetaminophen (TYLENOL) 650 MG CR tablet Take 1,300 mg by mouth every 8 (eight) hours as needed for pain.   Yes [provider]  albuterol (PROVENTIL) (2.5 MG/3ML) 0.083% nebulizer solution USE 1 VIAL IN NEBULIZER EVERY 4 HOURS AND AS NEEDED. Generic: VENTOLIN Patient taking differently: Take 2.5 mg by nebulization every 4 (four) hours as needed for shortness of breath or wheezing. 07/12/20  Yes Brand Males, MD  albuterol (VENTOLIN HFA) 108 (90 Base) MCG/ACT inhaler INHALE 2 PUFFS INTO THE LUNGS EVERY 6 HOURS AS NEEDED FOR WHEEZING OR SHORTNESS OF BREATH Patient taking differently: Inhale 2 puffs into the lungs every 6 (six) hours as needed for shortness of breath or wheezing. 10/10/20  Yes [name], NP  amiodarone (PACERONE) 200 MG tablet Take 1 tablet (200 mg total) by mouth daily. 06/29/20  Yes Martinique, [name] M, MD  azelastine (ASTELIN) 0.1 % nasal spray Place 1 spray into both nostrils 2 (two) times daily. Use in each nostril as directed Patient taking differently: Place 1 spray into both nostrils 2 (two) times daily as needed. Use in each nostril as directed 03/02/20  Yes Martyn Ehrich, NP  Blood Glucose Monitoring Suppl [facility] VERIO) w/Device KIT Check blood sugar twice daily DX:E11.9 10/12/19  Yes [name], NP  cetirizine (ZYRTEC) 10 MG tablet Take 10 mg by mouth daily as needed for allergies.    Yes [provider]  colestipol (COLESTID) 1 g tablet Take 1 g by mouth 2 (two) times daily. 06/29/20  Yes [provider]  Dextromethorphan-guaiFENesin (MUCINEX DM) 30-600 MG TB12 Take 2 tablets by mouth as needed (cough).   Yes [provider]  doxazosin (CARDURA) 4 MG tablet TAKE 1 TABLET BY MOUTH EVERY DAY TO HELP CONTROL BLOOD PRESSURE Patient taking differently: Take 4 mg by mouth daily. 09/12/20  Yes [name], NP  ferrous sulfate 325 (65 FE) MG EC tablet Take 325 mg by mouth 2 (two) times daily.   Yes [provider]  furosemide (LASIX) 40 MG tablet TAKE 2 TABLETS BY MOUTH DAILY AND 1 TABLET BY MOUTH AT LUNCH on  Mondays, Wednesdays and Fridays Patient taking differently: Take 40-80 mg by mouth See admin instructions. 2 tablets in the morning  daily. Take 1 tablet at lunch on m,w,f in addition to morning dose 09/17/20  Yes Eubanks, Carlos American, NP  glimepiride (AMARYL) 1 MG tablet TAKE 1 TABLET(1 MG) BY MOUTH DAILY WITH BREAKFAST Patient taking differently: Take 1 mg by mouth daily with breakfast. 11/19/20  Yes [name], NP  glucose blood test strip 1 each by Other route 2 (two) times daily. OneTouch Verio DX E11.8 10/12/19  Yes [name], NP  JANUVIA 50 MG  tablet TAKE 1 TABLET(50 MG) BY MOUTH DAILY Patient taking differently: Take 50 mg by mouth daily. 10/10/20  Yes [name], NP  Lancets [facility] ULTRASOFT) lancets 1 each by Other route 2 (two) times daily. OneTouch Verio DX E11.9 10/12/19  Yes [name], NP  levothyroxine (SYNTHROID) 88 MCG tablet TAKE 1 TABLET BY MOUTH DAILY ON AN EMPTY STOMACH Patient taking differently: Take 88 mcg by mouth daily before breakfast. 08/29/20  Yes Eubanks, Carlos American, NP  OVER THE COUNTER MEDICATION Place 1 drop into both eyes daily. [facility] brand lubricating eye drop   Yes [provider]  OXYGEN Inhale 2 L into the lungs continuous. Pt also uses 4L pulse via POC   Yes [provider]  pantoprazole (PROTONIX) 40 MG tablet TAKE 1 TABLET(40 MG) BY MOUTH TWICE DAILY Patient taking differently: Take 40 mg by mouth 2 (two) times daily. 07/11/20  Yes [name], NP  potassium gluconate 595 (99 K) MG TABS tablet Take 595 mg by mouth 2 (two) times daily.   Yes [provider]  predniSONE (DELTASONE) 10 MG tablet Take 4 tabs po daily x 3 days; then 3 tabs daily x3 days; then 2 tabs daily x3 days; then 1 tab daily x 3 days; then stop 01/14/21  Yes Martyn Ehrich, NP  rosuvastatin (CRESTOR) 10 MG  tablet TAKE 1 TABLET(10 MG) BY MOUTH DAILY Patient taking differently: Take 10 mg by mouth daily. 10/31/20  Yes [name], NP  TRELEGY ELLIPTA 200-62.5-25 MCG/INH AEPB INHALE 1 PUFF INTO THE LUNGS DAILY 10/15/20  Yes Martyn Ehrich, NP  cholecalciferol (VITAMIN D) 1000 units tablet Take 1,000 Units by mouth at bedtime.     [provider]    Inpatient Medications: Scheduled Meds: . amiodarone  200 mg Oral Daily  . doxazosin  4 mg Oral Daily  . fluticasone furoate-vilanterol  1 puff Inhalation Daily   And  . umeclidinium bromide  1 puff Inhalation Daily  . furosemide  40 mg Intravenous Q12H  . heparin  5,000 Units Subcutaneous Q8H  . insulin aspart  0-5 Units Subcutaneous QHS  . insulin aspart  0-9 Units Subcutaneous TID WC  . levothyroxine  88 mcg Oral Q0600  . pantoprazole  40 mg Oral BID  . rosuvastatin  10 mg Oral Daily  . sodium chloride flush  3 mL Intravenous Q12H   Continuous Infusions:  PRN Meds: acetaminophen **OR** acetaminophen, albuterol, methocarbamol, ondansetron **OR** ondansetron (ZOFRAN) IV  Allergies:    Allergies  Allergen Reactions  . Codeine Other (See Comments)    "Spaces out" the patient and she cannot walk well  . Vesicare [Solifenacin] Itching    Social History:   Social History   Socioeconomic History  . Marital status: Divorced    Spouse name: Not on file  . Number of children: 1  . Years of education: Not on file  . Highest education level: Not on file  Occupational History  . Occupation: Retired Market researcher for [employer]  Tobacco Use  . Smoking status: Former Smoker    Packs/day: 1.00    Years: 40.00    Pack years: 40.00    Quit date: 09/07/1989    Years since quitting: 31.3  . Smokeless tobacco: Never Used  Vaping Use  . Vaping Use: Never used  Substance and Sexual Activity  . Alcohol use: No    Alcohol/week: 0.0 standard drinks  . Drug use: No  . Sexual activity: Never  Other Topics Concern  .  Not on file  Social History Narrative   Her daughter & 3 grandchildren live in the area.     Social Determinants of Health   Financial Resource Strain: Not on file  Food Insecurity: Not on file  Transportation Needs: Not on file  Physical Activity: Not on file  Stress: Not on file  Social Connections: Not on file  Intimate Partner Violence: Not on file    Family History:   Family History  Problem Relation Age of Onset  . Diabetes Father   . Stroke Father   . Heart disease Father   . Liver cancer Sister   . Heart disease Brother   . Asthma Sister   . Arthritis Sister   . Breast cancer Neg Hx    Family Status:  Family Status  Relation Name Status  . Father Barbaraann Share Deceased at age 75  . Sister Lula Deceased       LIVER  . Brother Ceil Deceased  . Sister Retha Deceased  . Sister Flossie Deceased       STOMACH  . Mother Terrence Dupont Deceased at age 57       BRAIN TUMOR  . Daughter [name] Alive  . Sister Johine Mae Deceased       ACCIDENTAL AUTO  . MGM  Deceased  . MGF  Deceased  . PGM  Deceased  . PGF  Deceased  . Neg Hx  (Not Specified)    ROS:  Please see the history of present illness.  All other ROS reviewed and negative.     Physical Exam/Data:   Vitals:   01/16/21 0700 01/16/21 0900 01/16/21 1027 01/16/21 1424  BP: 125/61 117/81 115/79   Pulse: (!) 116 (!) 116    Resp: (!) 29 (!) 25    Temp:    97.6 F (36.4 C)  TempSrc:    Oral  SpO2: 97% 97%      Intake/Output Summary (Last 24 hours) at 01/16/2021 1427 Last data filed at 01/16/2021 1226 Gross per 24 hour  Intake 430 ml  Output 425 ml  Net 5 ml   There were no vitals filed for Patricia visit. There is no height or weight on file to calculate BMI.   General: Overweight,  NAD Neck: Negative for carotid bruits. No JVD Lungs: Diminished in bilateral bases with bilateral rales. Breathing is unlabored. Cardiovascular: Irregularly irregular. No murmurs Abdomen: Soft, non-tender, non-distended. No obvious  abdominal masses. Extremities: R>L BLE 2+ edema. Radial pulses 2+ bilaterally Neuro: Alert and oriented. No focal deficits. No facial asymmetry. MAE spontaneously. Psych: Responds to questions appropriately with normal affect.     EKG:  The EKG was personally reviewed and demonstrates:  01/16/21 AF with HR 119bpm, known LBBB and no acute changes  Telemetry:  Telemetry was personally reviewed and demonstrates: 01/16/21 AF with HR 105bpm   Relevant CV Studies:  Echocardiogram 11/07/19:  1. Left ventricular ejection fraction, by visual estimation, is 40 to  45%. The left ventricle has mildly decreased function. There is moderately  increased left ventricular hypertrophy.  2. Abnormal septal motion consistent with left bundle branch block.  3. Left ventricular diastolic function could not be evaluated.  4. The left ventricle demonstrates global hypokinesis.  5. Global right ventricle has normal systolic function.The right  ventricular size is normal. No increase in right ventricular wall  thickness.  6. 23 mm Edwards S3 TAVR. V max 3.5 m/s, MG 26 mmHG, DI 0.36, EOA 1.01  cm2. Gradients are increased but stable compared with 10/14/2017. No  paravalvular leak.  7. Left atrial size was severely dilated.  8. Moderate to severe mitral annular calcification.  9. The mitral valve is degenerative. Moderate mitral valve regurgitation.  Mild mitral stenosis.  10. The tricuspid valve is grossly normal.  11. Aortic valve regurgitation is not visualized.  12. The pulmonic valve was grossly normal. Pulmonic valve regurgitation is  trivial.  13. Moderately elevated pulmonary artery systolic pressure.  14. The inferior vena cava is dilated in size with <50% respiratory  variability, suggesting right atrial pressure of 15 mmHg.  15. The tricuspid regurgitant velocity is 3.59 m/s, and with an assumed  right atrial pressure of 15 mmHg, the estimated right ventricular systolic  pressure is  moderately elevated at 66.6 mmHg.   In comparison to the previous echocardiogram(s): EF has improved to 40-45%  compared with 25-30% noted on 07/27/2019. TAVR gradients remain stable but  elevated when compared to 10/14/2019. TAVR gradients were lower on  07/27/2019 due to severely reduced EF.    R/LHC 09/02/2016:   LV end diastolic pressure is moderately elevated.  There is severe aortic valve stenosis.  There is no mitral valve stenosis.  Prox LAD to Mid LAD lesion, 20 %stenosed.  Ost 3rd Mrg to 3rd Mrg lesion, 75 %stenosed.  Ost 2nd Mrg to 2nd Mrg lesion, 30 %stenosed.  Ost RCA to Dist RCA lesion, 10 %stenosed.  Hemodynamic findings consistent with mild pulmonary hypertension.   1. Single vessel obstructive CAD involving OM3 2. Moderate to severe aortic stenosis. Mean gradient 41 mm Hg, valve index 0.7 3. Mild pulmonary HTN 4. Elevated LV filling pressures. Prominent V waves on PCWP tracings c/w decreased LV compliance.  5. Normal cardiac output.   Plan: recommend consideration for AVR/ single vessel CABG.   Laboratory Data:  Chemistry Recent Labs  Lab 01/05/2021 1904 12/26/2020 2104 01/16/21 0437  NA 137  --  137  K 5.7* 5.0 4.6  CL 97*  --  97*  CO2 31  --  31  GLUCOSE 223*  --  190*  BUN 60*  --  63*  CREATININE 2.18*  --  2.16*  CALCIUM 9.4  --  9.2  GFRNONAA 22*  --  22*  ANIONGAP 9  --  9    Total Protein  Date Value Ref Range Status  01/02/2021 7.1 6.5 - 8.1 g/dL Final  02/18/2017 7.0 6.0 - 8.5 g/dL Final   Albumin  Date Value Ref Range Status  01/04/2021 3.8 3.5 - 5.0 g/dL Final  02/18/2017 4.0 3.5 - 4.8 g/dL Final   AST  Date Value Ref Range Status  01/19/2021 29 15 - 41 U/L Final   ALT  Date Value Ref Range Status  01/20/2021 18 0 - 44 U/L Final   Alkaline Phosphatase  Date Value Ref Range Status  01/14/2021 55 38 - 126 U/L Final   Total Bilirubin  Date Value Ref Range Status  12/25/2020 0.7 0.3 - 1.2 mg/dL Final   Bilirubin Total   Date Value Ref Range Status  02/18/2017 0.2 0.0 - 1.2 mg/dL Final   Hematology Recent Labs  Lab 12/25/2020 1904 01/16/21 0437  WBC 5.8 6.0  RBC 3.59* 3.25*  HGB 10.2* 9.4*  HCT 34.2* 31.6*  MCV 95.3 97.2  MCH 28.4 28.9  MCHC 29.8* 29.7*  RDW 16.1* 16.1*  PLT 150 138*   Cardiac EnzymesNo results for input(s): TROPONINI in the last 168 hours. No results for  input(s): TROPIPOC in the last 168 hours.  BNP Recent Labs  Lab 12/28/2020 1904  BNP 1,681.6*    DDimer No results for input(s): DDIMER in the last 168 hours. TSH:  Lab Results  Component Value Date   TSH 3.91 02/03/2020   Lipids: Lab Results  Component Value Date   CHOL 156 11/07/2019   HDL 73 11/07/2019   LDLCALC 61 11/07/2019   TRIG 138 11/07/2019   CHOLHDL 2.1 11/07/2019   HgbA1c: Lab Results  Component Value Date   HGBA1C 7.1 (H) 01/16/2021    Radiology/Studies:  DG Chest Portable 1 View  Result Date: 12/25/2020 CLINICAL DATA:  Shortness of breath. EXAM: PORTABLE CHEST 1 VIEW COMPARISON:  Chest x-ray 12/17/2020, CT chest 05/31/2020 FINDINGS: Cardiomegaly. The heart size and mediastinal contours are unchanged. Aortic arch calcifications. Aortic valve replacement. Prominence of the hilar vasculature. Biapical pleural/pulmonary scarring. Streaky airspace opacities at the right lower lobe. No pulmonary edema. Bilateral trace to small volume pleural effusions. No pneumothorax. No acute osseous abnormality. Partially visualized right shoulder arthroplasty. IMPRESSION: 1. Pulmonary edema with bilateral trace to small volume pleural effusion. Superimposed infection/inflammation not excluded. 2. Cardiomegaly. Electronically Signed   By: Iven Finn M.D.   On: 01/02/2021 20:48   Assessment and Plan:   1. Acute on chronic systolic CHF: -Last echocardiogram 10/2019 with LVEF at 40-45%>>she presented with a several week hx of worsening SOB and LE edema. HRs at home were noted to be in the 90-120 range although she  could not determine if she was in fact in AF or not. She called our office on Friday with recommendations to schedule an in-person follow up. EKG from last month showed NSR however if back in AF >>he felt Patricia could certainly be causing her symptoms. On ED presentation, BNP found to be elevated at 1681 with CXR consistent with pulmonary edema with bilateral trace pleural effusions, cardiomegaly. Creatinine at 2.16. HsT at 35>>34 with flat trend>>not consisitent with ACS. COVID negative. She was given IV Lasix 17m x1 in the ED with response.  -Will add IV lasix 1038mnow then start IV Lasix 8035mID tomorrow  -Follow AM BMET  -Weight, 226lb  -I&O, net equal  -Echocardiogram was ordered and performed however results are pending.  -EKG on admission showed AF with known LBBB with a rate of 118bpm -Likely fluid volume overload is in the setting of atrial fibrillation however could also be multifactorial given valve disease, PAH, hx of single vessel CAD, and chronic lung disease.   2. Single vessel CAD: -Pre-TAVR cath with single vessel CAD with 75% OM1 occlusion. Initial recommendations were for single vessel CABG/AVR however plan was ultimately decided to pursue TAVR -No anginal symptoms -Does have a known LBBB -Continue risk factor modification   3. Paroxysmal atrial fibrillation: -Longstanding atrial paroxsymal AF s/p multiple cardioversion procedures. [facility]IB requiring transfusions. On last cardioversion 07/2019 she was placed on short term 4 week AC regimen. Currently remains in AF with rates in the low 100bpm range -Would focus on rate control at Patricia time given that anticoagulation is relatively contraindicated in Patricia patient. Will discuss with MD -On PTA amiodarone  -Check TSH, LFTs  4. CKD Stage IV: -Cr, 2.16 -Baseline appears to be in the 1.8 range -Follow closely with aggressive diuresis   5. HLD: -Last LDL, 61  from 11/07/2019 -Continue Crestor    6. AS s/p TAVR: -Last echo with improved to  40-45% compared with 25-30% noted on 07/27/2019. TAVR gradients remain stable but elevated when compared to 10/14/2019. TAVR gradients were lower on 07/27/2019 due to severely reduced EF    For questions or updates, please contact [place] Please consult [email or web address] for contact info under Cardiology/STEMI.   Lyndel Safe NP-C [facility] Pager: [phone] 01/16/2021 2:27 PM  Patient seen and examined with Kathyrn Drown, NP-C.  Agree as above, with the following exceptions and changes as noted below.  Patricia is an 85 year old [name] with a history of paroxysmal atrial fibrillation and known left bundle branch block.  Her PAF is not anticoagulated due to recurrent significant GI bleeding, also has a history of severe aortic valve stenosis status post TAVR in 0962, chronic systolic and diastolic heart failure with a recently reported ejection fraction of 40 to 45%, now down to 25 to 30% on echocardiogram from today.  Also has a history of CKD stage IV, chronic respiratory failure on 2 L supplemental O2 and OSA that is untreated due to intolerance of CPAP.  Presents with weeks of shortness of breath and lower extremity edema, with elevated BNP and lower extremity swelling.  When I saw the patient she was feeling slightly better and states being on oxygen and resting is helping.  She has received a dose of IV Lasix with minimal urine output, unsure if Patricia has been accurately charted.  We discussed aggressive diuresis as first attempt.  She is not having any chest discomfort, no palpitations.. Gen: NAD, CV: Irregularly irregular, soft systolic murmur murmurs, Lungs: clear laterally, Abd: soft, Extrem: Warm, well perfused, 1+ right lower extremity edema, mild left lower extremity edema, Neuro/Psych: alert and oriented x 3, normal mood and affect. All available labs, radiology testing, previous records reviewed.    Recommendations: -Will give Lasix 100 mg IV now and 80 mg IV twice daily starting tomorrow.  Creatinine is abnormal at baseline and is above 2 today.  She agrees with Patricia plan. -Evidence of right ventricular dysfunction on echo, not commented on on last echo.  Right ventricular dysfunction may be secondary to pulmonary disease with chronic respiratory failure and untreated OSA.  Pending response to diuretics, may want to consider right heart catheterization Patricia admission. -LV function has also worsened.  Unclear if Patricia is ischemic in origin versus tachycardia mediated cardiomyopathy from being in atrial fibrillation.  She may have been in atrial fibrillation several weeks ago when her shortness of breath began.  We will follow Patricia closely on telemetry.  We will not use ACE/ARB/Arni/MRA at Patricia time due to renal failure and need for diuresis.  Not currently on a beta-blocker, will thoroughly chart review why Patricia may be, when better compensated from heart failure will consider adding Patricia on.  Cannot use SGLT2 inhibitor given renal function and would avoid given age.  Cardiology will follow along for assistance in management of atrial fibrillation and decompensated heart failure. Elouise Munroe, MD 01/16/21 5:12 PM

## 2021-01-16 NOTE — ED Notes (Signed)
Patient transferred to hospital bed.

## 2021-01-16 NOTE — Plan of Care (Signed)
  Problem: Clinical Measurements: ?Goal: Respiratory complications will improve ?Outcome: Progressing ?  ?Problem: Elimination: ?Goal: Will not experience complications related to urinary retention ?Outcome: Progressing ?  ?

## 2021-01-16 NOTE — Progress Notes (Signed)
Pt. Refusing sq Heparin. On call for [facility] paged to make aware. See new orders.

## 2021-01-16 NOTE — Progress Notes (Signed)
Patient ID: Patricia [name], female   DOB: 12/24/1935, 85 y.o.   MRN: [id number]  PROGRESS NOTE    Patricia [name]  GYJ:[id number] DOB: 03/16/1936 DOA: 01/01/2021 PCP: [name], NP   Brief Narrative:  85 y.o. female with medical history significant for chronic systolic CHF (EF improved from 25-30% to 40-45% on last TTE 11/07/2019), chronic respiratory failure with hypoxia on 2 L supplemental O2 via [place] at all times, paroxysmal atrial fibrillation on amiodarone, COPD, aortic stenosis s/p TAVR, CKD stage IV, T2DM, HTN, hypothyroidism, HLD, LBBB, and OHS not on BiPAP or CPAP presented with worsening shortness of breath.  On presentation, creatinine was 2.18, BNP of 1681.6 with high-sensitivity troponin of 35.  COVID-19 test was negative.  Chest x-ray showed pulmonary edema and bilateral pleural effusions with cardiomegaly.  She was given IV Lasix.  Assessment & Plan:   Acute on chronic systolic CHF -Presented with orthopnea, DOE, peripheral edema.  Last EF 40-45% on 11/07/2019.  BNP >1600.  Troponin minimally elevated likely due to demand ischemia. -Continue IV Lasix.  Strict input and output.  Daily weights.  Fluid restriction.  Follow 2D echo.  I have consulted cardiology.  Follow recommendations.  Acute on chronic respiratory failure with hypoxia -Wears 2 L oxygen via nasal cannula at the time at home.  Required 4 L on presentation in the ED.  Currently on 2 L oxygen.  Acute kidney injury on chronic kidney disease stage IV -Probably from cardiorenal syndrome.  Continue diuresis.  Monitor creatinine.  COPD -Currently stable.  Continue home inhalers  Paroxysmal A. fib -Currently tachycardic.  Continue amiodarone.  Not on anticoagulation due to history of GI bleed  History of aortic stenosis status post TAVR -Follow repeat echo and cardiology recommendations  Diabetes mellitus type II -Home Januvia and Amaryl on hold for now.  Continue CBGs with SSI.  A1c  7.1  Hypertension -Continue Lasix and doxazosin  Hyperlipidemia Continue statin  Anemia of chronic disease -From renal failure.  Hemoglobin stable.  Monitor  Obesity hypoventilation syndrome -Outpatient sleep study was recommended, not completed and not currently on CPAP or BiPAP as an outpatient.  Generalized deconditioning -PT eval  DVT prophylaxis: Subcutaneous heparin Code Status: Full Family Communication: None at bedside Disposition Plan: Status is: Observation  The patient will require care spanning > 2 midnights and should be moved to inpatient because: Inpatient level of care appropriate due to severity of illness  Dispo: The patient is from: Home              Anticipated d/c is to: Home              Patient currently is not medically stable to d/c.   Difficult to place patient No   Consultants: Cardiology  Procedures: Echo pending  Antimicrobials: None   Subjective: Patient seen and examined at bedside.  Feels slightly better but still short of breath with exertion.  Denies any worsening cough, fever or vomiting.  Objective: Vitals:   01/16/21 0630 01/16/21 0645 01/16/21 0700 01/16/21 0900  BP: 126/66  125/61 117/81  Pulse: (!) 108 (!) 111 (!) 116 (!) 116  Resp: (!) 25 (!) 29 (!) 29 (!) 25  Temp:      TempSrc:      SpO2: 99% 97% 97% 97%    Intake/Output Summary (Last 24 hours) at 01/16/2021 1008 Last data filed at 01/16/2021 0400 Gross per 24 hour  Intake 430 ml  Output --  Net 430 ml  There were no vitals filed for this visit.  Examination:  General exam: Appears calm and comfortable.  Currently on 2 L oxygen via nasal cannula.  Elderly female/chronically ill looking. Respiratory system: Bilateral decreased breath sounds at bases with basilar crackles, tachypneic Cardiovascular system: S1 & S2 heard, tachycardic Gastrointestinal system: Abdomen is nondistended, soft and nontender. Normal bowel sounds heard. Extremities: No cyanosis,  clubbing; lower extremity edema present Central nervous system: Alert and oriented. No focal neurological deficits. Moving extremities Skin: No rashes, lesions or ulcers Psychiatry: Flat affect    Data Reviewed: I have personally reviewed following labs and imaging studies  CBC: Recent Labs  Lab 01/16/2021 1904 01/16/21 0437  WBC 5.8 6.0  NEUTROABS 5.3  --   HGB 10.2* 9.4*  HCT 34.2* 31.6*  MCV 95.3 97.2  PLT 150 474*   Basic Metabolic Panel: Recent Labs  Lab 01/21/2021 1904 01/20/2021 2104 01/16/21 0437  NA 137  --  137  K 5.7* 5.0 4.6  CL 97*  --  97*  CO2 31  --  31  GLUCOSE 223*  --  190*  BUN 60*  --  63*  CREATININE 2.18*  --  2.16*  CALCIUM 9.4  --  9.2  MG  --   --  1.9   GFR: CrCl cannot be calculated (Unknown ideal weight.). Liver Function Tests: Recent Labs  Lab 01/10/2021 1904  AST 29  ALT 18  ALKPHOS 55  BILITOT 0.7  PROT 7.1  ALBUMIN 3.8   No results for input(s): LIPASE, AMYLASE in the last 168 hours. No results for input(s): AMMONIA in the last 168 hours. Coagulation Profile: No results for input(s): INR, PROTIME in the last 168 hours. Cardiac Enzymes: No results for input(s): CKTOTAL, CKMB, CKMBINDEX, TROPONINI in the last 168 hours. BNP (last 3 results) No results for input(s): PROBNP in the last 8760 hours. HbA1C: Recent Labs    01/16/21 0437  HGBA1C 7.1*   CBG: Recent Labs  Lab 01/16/21 0055 01/16/21 0726  GLUCAP 158* 156*   Lipid Profile: No results for input(s): CHOL, HDL, LDLCALC, TRIG, CHOLHDL, LDLDIRECT in the last 72 hours. Thyroid Function Tests: No results for input(s): TSH, T4TOTAL, FREET4, T3FREE, THYROIDAB in the last 72 hours. Anemia Panel: No results for input(s): VITAMINB12, FOLATE, FERRITIN, TIBC, IRON, RETICCTPCT in the last 72 hours. Sepsis Labs: No results for input(s): PROCALCITON, LATICACIDVEN in the last 168 hours.  Recent Results (from the past 240 hour(s))  Resp Panel by RT-PCR (Flu A&B, Covid)  Nasopharyngeal Swab     Status: None   Collection Time: 01/05/2021  9:04 PM   Specimen: Nasopharyngeal Swab; Nasopharyngeal(NP) swabs in vial transport medium  Result Value Ref Range Status   SARS Coronavirus 2 by RT PCR NEGATIVE NEGATIVE Final    Comment: (NOTE) SARS-CoV-2 target nucleic acids are NOT DETECTED.  The SARS-CoV-2 RNA is generally detectable in upper respiratory specimens during the acute phase of infection. The lowest concentration of SARS-CoV-2 viral copies this assay can detect is 138 copies/mL. A negative result does not preclude SARS-Cov-2 infection and should not be used as the sole basis for treatment or other patient management decisions. A negative result may occur with  improper specimen collection/handling, submission of specimen other than nasopharyngeal swab, presence of viral mutation(s) within the areas targeted by this assay, and inadequate number of viral copies(<138 copies/mL). A negative result must be combined with clinical observations, patient history, and epidemiological information. The expected result is Negative.  Fact Sheet for  Patients:  EntrepreneurPulse.com.au  Fact Sheet for Healthcare Providers:  IncredibleEmployment.be  This test is no t yet approved or cleared by the [place] FDA and  has been authorized for detection and/or diagnosis of SARS-CoV-2 by FDA under an Emergency Use Authorization (EUA). This EUA will remain  in effect (meaning this test can be used) for the duration of the COVID-19 declaration under Section 564(b)(1) of the Act, 21 U.S.C.section 360bbb-3(b)(1), unless the authorization is terminated  or revoked sooner.       Influenza A by PCR NEGATIVE NEGATIVE Final   Influenza B by PCR NEGATIVE NEGATIVE Final    Comment: (NOTE) The Xpert Xpress SARS-CoV-2/FLU/RSV plus assay is intended as an aid in the diagnosis of influenza from Nasopharyngeal swab specimens and should not be  used as a sole basis for treatment. Nasal washings and aspirates are unacceptable for Xpert Xpress SARS-CoV-2/FLU/RSV testing.  Fact Sheet for Patients: EntrepreneurPulse.com.au  Fact Sheet for Healthcare Providers: IncredibleEmployment.be  This test is not yet approved or cleared by the [place] FDA and has been authorized for detection and/or diagnosis of SARS-CoV-2 by FDA under an Emergency Use Authorization (EUA). This EUA will remain in effect (meaning this test can be used) for the duration of the COVID-19 declaration under Section 564(b)(1) of the Act, 21 U.S.C. section 360bbb-3(b)(1), unless the authorization is terminated or revoked.  Performed at [facility], [place] [address], [place], [place] [address]          Radiology Studies: DG Chest Portable 1 View  Result Date: 01/05/2021 CLINICAL DATA:  Shortness of breath. EXAM: PORTABLE CHEST 1 VIEW COMPARISON:  Chest x-ray 12/17/2020, CT chest 05/31/2020 FINDINGS: Cardiomegaly. The heart size and mediastinal contours are unchanged. Aortic arch calcifications. Aortic valve replacement. Prominence of the hilar vasculature. Biapical pleural/pulmonary scarring. Streaky airspace opacities at the right lower lobe. No pulmonary edema. Bilateral trace to small volume pleural effusions. No pneumothorax. No acute osseous abnormality. Partially visualized right shoulder arthroplasty. IMPRESSION: 1. Pulmonary edema with bilateral trace to small volume pleural effusion. Superimposed infection/inflammation not excluded. 2. Cardiomegaly. Electronically Signed   By: Iven Finn M.D.   On: 12/31/2020 20:48        Scheduled Meds: . amiodarone  200 mg Oral Daily  . doxazosin  4 mg Oral Daily  . fluticasone furoate-vilanterol  1 puff Inhalation Daily   And  . umeclidinium bromide  1 puff Inhalation Daily  . furosemide  40 mg Intravenous Q12H  . heparin  5,000 Units Subcutaneous Q8H  .  insulin aspart  0-5 Units Subcutaneous QHS  . insulin aspart  0-9 Units Subcutaneous TID WC  . levothyroxine  88 mcg Oral Q0600  . pantoprazole  40 mg Oral BID  . rosuvastatin  10 mg Oral Daily  . sodium chloride flush  3 mL Intravenous Q12H   Continuous Infusions:        Aline August, MD [facility] 01/16/2021, 10:08 AM

## 2021-01-16 NOTE — ED Notes (Addendum)
Patient provided with bagged meal and 1 cup of water. Feeding self at this time.

## 2021-01-16 NOTE — Evaluation (Signed)
Physical Therapy Evaluation Patient Details Name: Patricia [name] MRN: [id number] DOB: Nov 27, 1935 Today's Date: 01/16/2021   History of Present Illness  Pt is an 85 y/o female admitted 3/22 secondary to increased SOB and LE edema. Thought to be secondary to CHF exacerbation. PMH includes HTN, DM, COPD, a fib, and s/p TAVR.  Clinical Impression  Pt admitted secondary to problem above with deficits below. Pt requiring min to mod A for bed mobility tasks this session. Upon sitting, pt reporting increased dizziness. BP at 93/74 and HR at 121 max. RN notified and further mobility deferred. Pt currently lives alone, but has support for daughter. If pt progresses well, will likely be able to d/c home with [facility] services. However, if pt does not progress well, may need to consider SNF. Will continue to follow acutely and update recommendations based on pt progression.     Follow Up Recommendations Other (comment) (TBD pending mobility progression)    Equipment Recommendations  Other (comment) (TBD pending progression)    Recommendations for Other Services       Precautions / Restrictions Precautions Precautions: Fall;Other (comment) Precaution Comments: Watch BP. Reports 2 falls within the past 2 months Restrictions Weight Bearing Restrictions: No      Mobility  Bed Mobility Overal bed mobility: Needs Assistance Bed Mobility: Supine to Sit;Sit to Supine     Supine to sit: Mod assist Sit to supine: Min assist   General bed mobility comments: Mod A for trunk assist and assist with scooting hips to EOB. Upon sitting, pt reporting increased dizziness. BP at 93/74 and HR at 121 max. Returned to supine with min A for LE assist.    Transfers                    Ambulation/Gait                Stairs            Wheelchair Mobility    Modified Rankin (Stroke Patients Only)       Balance Overall balance assessment: Needs assistance Sitting-balance support: No upper  extremity supported;Feet supported Sitting balance-Leahy Scale: Fair                                       Pertinent Vitals/Pain Pain Assessment: No/denies pain    Home Living Family/patient expects to be discharged to:: Private residence Living Arrangements: Alone Available Help at Discharge: Family;Available PRN/intermittently Type of Home: House Home Access: Stairs to enter Entrance Stairs-Rails: Left Entrance Stairs-Number of Steps: 2 Home Layout: One level Home Equipment: Clinical cytogeneticist - 4 wheels;Cane - single point      Prior Function Level of Independence: Independent with assistive device(s)         Comments: Daughter takes to appointments. Pt not driving. Uses rollator for mobility. Reports independence with ADLs.     Hand Dominance        Extremity/Trunk Assessment   Upper Extremity Assessment Upper Extremity Assessment: Defer to OT evaluation    Lower Extremity Assessment Lower Extremity Assessment: Generalized weakness    Cervical / Trunk Assessment Cervical / Trunk Assessment: Kyphotic  Communication   Communication: No difficulties  Cognition Arousal/Alertness: Awake/alert Behavior During Therapy: WFL for tasks assessed/performed Overall Cognitive Status: Within Functional Limits for tasks assessed  General Comments General comments (skin integrity, edema, etc.): Daughter present during session.    Exercises     Assessment/Plan    PT Assessment Patient needs continued PT services  PT Problem List Decreased strength;Decreased balance;Decreased activity tolerance;Decreased mobility;Decreased knowledge of use of DME;Decreased knowledge of precautions       PT Treatment Interventions DME instruction;Gait training;Functional mobility training;Therapeutic activities;Therapeutic exercise;Stair training;Balance training;Patient/family education    PT Goals (Current  goals can be found in the Care Plan section)  Acute Rehab PT Goals Patient Stated Goal: to feel better and go home PT Goal Formulation: With patient Time For Goal Achievement: 01/30/21 Potential to Achieve Goals: Good    Frequency Min 3X/week   Barriers to discharge        Co-evaluation               AM-PAC PT "6 Clicks" Mobility  Outcome Measure Help needed turning from your back to your side while in a flat bed without using bedrails?: A Little Help needed moving from lying on your back to sitting on the side of a flat bed without using bedrails?: A Lot Help needed moving to and from a bed to a chair (including a wheelchair)?: A Lot Help needed standing up from a chair using your arms (e.g., wheelchair or bedside chair)?: A Lot Help needed to walk in hospital room?: A Lot Help needed climbing 3-5 steps with a railing? : A Lot 6 Click Score: 13    End of Session Equipment Utilized During Treatment: Oxygen Activity Tolerance: Treatment limited secondary to medical complications (Comment) (low BP) Patient left: in bed;with call bell/phone within reach;with family/visitor present (on bed in ED) Nurse Communication: Mobility status PT Visit Diagnosis: Unsteadiness on feet (R26.81);Muscle weakness (generalized) (M62.81);History of falling (Z91.81)    Time: 7125-2479 PT Time Calculation (min) (ACUTE ONLY): 16 min   Charges:   PT Evaluation $PT Eval Moderate Complexity: 1 Mod          Patricia [name], PT, DPT  Acute Rehabilitation Services  Pager: [phone] Office: [phone]   Patricia [name] 01/16/2021, 12:54 PM

## 2021-01-17 ENCOUNTER — Other Ambulatory Visit: Payer: Self-pay | Admitting: Student

## 2021-01-17 ENCOUNTER — Inpatient Hospital Stay (HOSPITAL_COMMUNITY): Payer: Medicare Other

## 2021-01-17 DIAGNOSIS — E662 Morbid (severe) obesity with alveolar hypoventilation: Secondary | ICD-10-CM

## 2021-01-17 DIAGNOSIS — J449 Chronic obstructive pulmonary disease, unspecified: Secondary | ICD-10-CM

## 2021-01-17 DIAGNOSIS — I4819 Other persistent atrial fibrillation: Secondary | ICD-10-CM

## 2021-01-17 DIAGNOSIS — J9622 Acute and chronic respiratory failure with hypercapnia: Secondary | ICD-10-CM

## 2021-01-17 DIAGNOSIS — J9621 Acute and chronic respiratory failure with hypoxia: Secondary | ICD-10-CM | POA: Diagnosis not present

## 2021-01-17 DIAGNOSIS — I5043 Acute on chronic combined systolic (congestive) and diastolic (congestive) heart failure: Secondary | ICD-10-CM

## 2021-01-17 DIAGNOSIS — I5023 Acute on chronic systolic (congestive) heart failure: Secondary | ICD-10-CM | POA: Diagnosis not present

## 2021-01-17 DIAGNOSIS — I2609 Other pulmonary embolism with acute cor pulmonale: Secondary | ICD-10-CM

## 2021-01-17 LAB — CBC WITH DIFFERENTIAL/PLATELET
Abs Immature Granulocytes: 0.05 10*3/uL (ref 0.00–0.07)
Basophils Absolute: 0 10*3/uL (ref 0.0–0.1)
Basophils Relative: 1 %
Eosinophils Absolute: 0 10*3/uL (ref 0.0–0.5)
Eosinophils Relative: 1 %
HCT: 29.9 % — ABNORMAL LOW (ref 36.0–46.0)
Hemoglobin: 9.4 g/dL — ABNORMAL LOW (ref 12.0–15.0)
Immature Granulocytes: 1 %
Lymphocytes Relative: 15 %
Lymphs Abs: 0.8 10*3/uL (ref 0.7–4.0)
MCH: 29.6 pg (ref 26.0–34.0)
MCHC: 31.4 g/dL (ref 30.0–36.0)
MCV: 94 fL (ref 80.0–100.0)
Monocytes Absolute: 0.5 10*3/uL (ref 0.1–1.0)
Monocytes Relative: 9 %
Neutro Abs: 4.2 10*3/uL (ref 1.7–7.7)
Neutrophils Relative %: 73 %
Platelets: 133 10*3/uL — ABNORMAL LOW (ref 150–400)
RBC: 3.18 MIL/uL — ABNORMAL LOW (ref 3.87–5.11)
RDW: 16.3 % — ABNORMAL HIGH (ref 11.5–15.5)
WBC: 5.6 10*3/uL (ref 4.0–10.5)
nRBC: 0.4 % — ABNORMAL HIGH (ref 0.0–0.2)

## 2021-01-17 LAB — BASIC METABOLIC PANEL
Anion gap: 8 (ref 5–15)
BUN: 73 mg/dL — ABNORMAL HIGH (ref 8–23)
CO2: 33 mmol/L — ABNORMAL HIGH (ref 22–32)
Calcium: 8.9 mg/dL (ref 8.9–10.3)
Chloride: 96 mmol/L — ABNORMAL LOW (ref 98–111)
Creatinine, Ser: 2.81 mg/dL — ABNORMAL HIGH (ref 0.44–1.00)
GFR, Estimated: 16 mL/min — ABNORMAL LOW (ref >60)
Glucose, Bld: 143 mg/dL — ABNORMAL HIGH (ref 70–99)
Potassium: 4.4 mmol/L (ref 3.5–5.1)
Sodium: 137 mmol/L (ref 135–145)

## 2021-01-17 LAB — GLUCOSE, CAPILLARY
Glucose-Capillary: 158 mg/dL — ABNORMAL HIGH (ref 70–99)
Glucose-Capillary: 126 mg/dL — ABNORMAL HIGH (ref 70–99)
Glucose-Capillary: 143 mg/dL — ABNORMAL HIGH (ref 70–99)

## 2021-01-17 LAB — MAGNESIUM: Magnesium: 1.9 mg/dL (ref 1.7–2.4)

## 2021-01-17 MED ORDER — ALBUTEROL SULFATE (2.5 MG/3ML) 0.083% IN NEBU
2.5000 mg | INHALATION_SOLUTION | RESPIRATORY_TRACT | Status: DC | PRN
  Administered 2021-01-21: 2.5 mg via RESPIRATORY_TRACT
  Filled 2021-01-17: qty 3

## 2021-01-17 MED ORDER — FUROSEMIDE 10 MG/ML IJ SOLN
160.0000 mg | Freq: Three times a day (TID) | INTRAVENOUS | Status: DC
  Administered 2021-01-18: 160 mg via INTRAVENOUS
  Filled 2021-01-17 (×2): qty 16

## 2021-01-17 MED ORDER — QUETIAPINE 12.5 MG HALF TABLET
12.5000 mg | ORAL_TABLET | Freq: Every day | ORAL | Status: DC
  Administered 2021-01-17 – 2021-01-24 (×8): 12.5 mg via ORAL
  Filled 2021-01-17 (×9): qty 1

## 2021-01-17 MED ORDER — MUSCLE RUB 10-15 % EX CREA
TOPICAL_CREAM | CUTANEOUS | Status: DC | PRN
  Administered 2021-01-24: 1 via TOPICAL
  Filled 2021-01-17 (×2): qty 85

## 2021-01-17 MED ORDER — FUROSEMIDE 10 MG/ML IJ SOLN
INTRAMUSCULAR | Status: AC
  Filled 2021-01-17: qty 4

## 2021-01-17 NOTE — Evaluation (Signed)
Occupational Therapy Evaluation Patient Details Name: Patricia [name] MRN: [id number] DOB: [birth year] Today's Date: 01/17/2021    History of Present Illness Pt is an 85 y.o. female admitted 01/01/2021 with worsening SOB and BLE edema. CXR showed cardiomegaly with pulmonary edema and trace to small bilateral pleural effusions. Workup for CHF exacerbation, PAF, AKI. PMH includes HTN, DM, COPD, afib (not on anticoag due to GIB), anemia, s/p TAVR (2017).   Clinical Impression   Patient admitted for the diagnosis above.  PTA she lived alone, relied on family for community mobility, walked with a 4WRW, and was Mod I with ADL from a sit/stand position.   Deficits are listed below.  Currently, she is needing up to [place] for lower body ADL and Min Guard for standing.  In room mobility will need to be assessed, hopefully during a functional task.  She is open to [facility] services, but would rather not need any.  OT will follow in the acute setting to maximize functional status, and work toward patient stated OT goals.      Follow Up Recommendations  Home health OT    Equipment Recommendations  None recommended by OT    Recommendations for Other Services       Precautions / Restrictions Precautions Precautions: Fall Precaution Comments: Soft BP Restrictions Weight Bearing Restrictions: No      Mobility Bed Mobility Overal bed mobility: Needs Assistance Bed Mobility: Supine to Sit     Supine to sit: Min assist;HOB elevated     General bed mobility comments: up in a recliner Patient Response: Cooperative  Transfers Overall transfer level: Needs assistance Equipment used: Rolling walker (2 wheeled) Transfers: Sit to/from Stand Sit to Stand: Min guard         General transfer comment: patient reports long day of testing, defers steps.    Balance Overall balance assessment: Needs assistance Sitting-balance support: No upper extremity supported;Feet supported Sitting balance-Leahy Scale:  Fair     Standing balance support: Bilateral upper extremity supported Standing balance-Leahy Scale: Poor Standing balance comment: using RW for stand balance.                           ADL either performed or assessed with clinical judgement   ADL Overall ADL's : Needs assistance/impaired     Grooming: Wash/dry hands;Wash/dry face;Set up;Sitting           Upper Body Dressing : Modified independent;Sitting   Lower Body Dressing: Minimal assistance;Sitting/lateral leans                       Vision Baseline Vision/History: No visual deficits Patient Visual Report: No change from baseline       Perception     Praxis      Pertinent Vitals/Pain Pain Assessment: No/denies pain     Hand Dominance Right   Extremity/Trunk Assessment Upper Extremity Assessment Upper Extremity Assessment: Generalized weakness   Lower Extremity Assessment Lower Extremity Assessment: Defer to PT evaluation       Communication Communication Communication: No difficulties   Cognition Arousal/Alertness: Awake/alert Behavior During Therapy: WFL for tasks assessed/performed Overall Cognitive Status: Within Functional Limits for tasks assessed                                     General Comments  Soft BP this session, pt denies dizziness; SpO2 >/  94% on 3L O2 [place]. Increased time discussing pt's h/o falls an d/c recommendations. pt reports owning 'all the things' for fall risk (including life alert necklace, necklace with GPS that automatically alerts EMS if fall occurs); pt not interested in SNF for rehab, reports, "If I need that sort of help, I'll pay for someone to come to me instead." Willing to consider HHPT services    Exercises Other Exercises Other Exercises: Seated LAQ, seated marching, ankle/heel raises; pt familiar with seated therex   Shoulder Instructions      Home Living Family/patient expects to be discharged to:: Private  residence Living Arrangements: Alone Available Help at Discharge: Family;Available PRN/intermittently Type of Home: House Home Access: Stairs to enter [employer] of Steps: 2 Entrance Stairs-Rails: Left Home Layout: One level     Bathroom Shower/Tub: Occupational psychologist: Handicapped height     Home Equipment: Clinical cytogeneticist - 4 wheels;Cane - single point          Prior Functioning/Environment Level of Independence: Independent with assistive device(s)        Comments: Daughter takes to appointments. Pt not driving. Uses rollator for mobility. Reports independence with ADLs.  Has a reacher and a LH sponge.        OT Problem List: Decreased strength;Decreased activity tolerance;Impaired balance (sitting and/or standing);Increased edema      OT Treatment/Interventions: Self-care/ADL training;Therapeutic exercise;Energy conservation;DME and/or AE instruction;Therapeutic activities;Balance training    OT Goals(Current goals can be found in the care plan section) Acute Rehab OT Goals Patient Stated Goal: I want to goet back to my dog OT Goal Formulation: With patient Time For Goal Achievement: 01/31/21 Potential to Achieve Goals: Good ADL Goals Pt Will Perform Grooming: with supervision;standing Pt Will Perform Lower Body Bathing: with supervision;sit to/from stand Pt Will Perform Lower Body Dressing: with supervision;sit to/from stand Pt Will Transfer to Toilet: with supervision;ambulating;regular height toilet Pt Will Perform Toileting - Clothing Manipulation and hygiene: with supervision;sit to/from stand  OT Frequency: Min 2X/week   Barriers to D/C:    none noted       Co-evaluation              AM-PAC OT "6 Clicks" Daily Activity     Outcome Measure Help from another person eating meals?: None Help from another person taking care of personal grooming?: A Little Help from another person toileting, which includes using toliet,  bedpan, or urinal?: A Little Help from another person bathing (including washing, rinsing, drying)?: A Little Help from another person to put on and taking off regular upper body clothing?: None Help from another person to put on and taking off regular lower body clothing?: A Little 6 Click Score: 20   End of Session Equipment Utilized During Treatment: Rolling walker;Oxygen Nurse Communication: Other (comment) (purewick fell out)  Activity Tolerance: Patient tolerated treatment well Patient left: in chair;with call bell/phone within reach;with chair alarm set  OT Visit Diagnosis: Unsteadiness on feet (R26.81);Muscle weakness (generalized) (M62.81);History of falling (Z91.81);Dizziness and giddiness (R42)                Time: 6808-8110 OT Time Calculation (min): 18 min Charges:  OT General Charges $OT Visit: 1 Visit OT Evaluation $OT Eval Moderate Complexity: 1 Mod  01/17/2021  Rich, OTR/L  Acute Rehabilitation Services  Office:  [phone]   [name] 01/17/2021, 4:33 PM

## 2021-01-17 NOTE — Progress Notes (Signed)
Physical Therapy Treatment Patient Details Name: Patricia [name] MRN: [id number] DOB: 09-25-1936 Today's Date: 01/17/2021    History of Present Illness Pt is an 85 y.o. female admitted 01/05/2021 with worsening SOB and BLE edema. CXR showed cardiomegaly with pulmonary edema and trace to small bilateral pleural effusions. Workup for CHF exacerbation, PAF, AKI. PMH includes HTN, DM, COPD, afib (not on anticoag due to GIB), anemia, s/p TAVR (2017).   PT Comments    Pt slowly progressing with mobility; activity progression remains limited by SOB and soft BP (see values below). Pt able to stand and take pivotal steps with min guard for balance; seated rest to recover DOE and fatigue with short activity bouts. Increased time discussing h/o falls and safe discharge plan. Pt adamantly plans to return home (declining SNF), willing to consider HHPT services. Discharge plan updated accordingly. Will continue to follow acutely.  Orthostatic BPs Supine 106/80  Sitting 80/53  Post-standing transfer 89/53  Sitting with legs recliner 98/60      Follow Up Recommendations  Home health PT;Supervision for mobility/OOB     Equipment Recommendations  None recommended by PT    Recommendations for Other Services       Precautions / Restrictions Precautions Precautions: Fall;Other (comment) Precaution Comments: Soft BP Restrictions Weight Bearing Restrictions: No    Mobility  Bed Mobility Overal bed mobility: Needs Assistance Bed Mobility: Supine to Sit     Supine to sit: Min assist;HOB elevated     General bed mobility comments: MinA for HHA to elevate trunk, able to scoot hips to EOB and reposition well without assist; denies dizziness    Transfers Overall transfer level: Needs assistance Equipment used: None Transfers: Sit to/from Stand Sit to Stand: Min guard         General transfer comment: Pt declined use of RW, pivotal steps from bed to recliner with min guard; pt denies  dizziness, seated rest then additional pivotal steps from recliner to [facility], pt reaching to armrest for UE support  Ambulation/Gait             General Gait Details: Deferred additional distance secondary to SOB and soft BP, pt also needing time to have bowel movement   Stairs             Wheelchair Mobility    Modified Rankin (Stroke Patients Only)       Balance Overall balance assessment: Needs assistance Sitting-balance support: No upper extremity supported;Feet supported Sitting balance-Leahy Scale: Fair       Standing balance-Leahy Scale: Poor Standing balance comment: Reaching to furniture for single UE support with standing and pivotal steps                            Cognition Arousal/Alertness: Awake/alert Behavior During Therapy: WFL for tasks assessed/performed Overall Cognitive Status: Within Functional Limits for tasks assessed                                        Exercises Other Exercises Other Exercises: Seated LAQ, seated marching, ankle/heel raises; pt familiar with seated therex    General Comments General comments (skin integrity, edema, etc.): Soft BP this session, pt denies dizziness; SpO2 >/94% on 3L O2 [place]. Increased time discussing pt's h/o falls an d/c recommendations. pt reports owning 'all the things' for fall risk (including life alert necklace, necklace with  GPS that automatically alerts EMS if fall occurs); pt not interested in SNF for rehab, reports, "If I need that sort of help, I'll pay for someone to come to me instead." Willing to consider HHPT services      Pertinent Vitals/Pain Pain Assessment: No/denies pain    Home Living                      Prior Function            PT Goals (current goals can now be found in the care plan section) Progress towards PT goals: Progressing toward goals    Frequency    Min 3X/week      PT Plan Discharge plan needs to be updated     Co-evaluation              AM-PAC PT "6 Clicks" Mobility   Outcome Measure  Help needed turning from your back to your side while in a flat bed without using bedrails?: None Help needed moving from lying on your back to sitting on the side of a flat bed without using bedrails?: A Little Help needed moving to and from a bed to a chair (including a wheelchair)?: A Little Help needed standing up from a chair using your arms (e.g., wheelchair or bedside chair)?: A Little Help needed to walk in hospital room?: A Little Help needed climbing 3-5 steps with a railing? : A Lot 6 Click Score: 18    End of Session Equipment Utilized During Treatment: Oxygen Activity Tolerance: Patient tolerated treatment well;Treatment limited secondary to medical complications (Comment) (soft BP, SOB) Patient left: with call bell/phone within reach (seated on [facility]) Nurse Communication: Mobility status;Other (comment) (pt on BSC needing increased time for BM) PT Visit Diagnosis: Unsteadiness on feet (R26.81);Muscle weakness (generalized) (M62.81);History of falling (Z91.81)     Time: 9774-1423 PT Time Calculation (min) (ACUTE ONLY): 33 min  Charges:  $Therapeutic Activity: 23-37 mins                     Mabeline Caras, PT, DPT Acute Rehabilitation Services  Pager [phone] Office [place] 01/17/2021, 1:42 PM

## 2021-01-17 NOTE — Consult Note (Signed)
ELECTROPHYSIOLOGY CONSULT NOTE    Patient ID: [name] MRN: [id number], DOB/AGE: 12/24/1935 85 y.o.  Admit date: 12/27/2020 Date of Consult: 01/17/2021  Primary Physician: [name], NP Primary Cardiologist: [name] Martinique, MD  Electrophysiologist: New  Referring Provider: Dr. Margaretann Loveless  Patient Profile: Patricia [name] is a 85 y.o. female with a history of paroxysmal atrial fibrillation with known LBBB >> not on anticoagulation due to recurrent GIB, HTN, HLD, AS s/p TAVR 2017, chronic combined systolic and diastolic CHF, CKD IV, anemia, obesity, chronic respiratory failure on 02, and DM2 who is being seen today for the evaluation of AF with OAC intolerance and HF at the request of Dr. Margaretann Loveless.  HPI:  Patricia [name] is a 85 y.o. female with complicated cardiac history as above.   Pt underwent TAVR 2017; R/LHC at that time showed single vessel CAD with 75% OM1 disease.  She had had bleeding from AVMs in the past requiring multiple transfusions.  She had last TEE/DCCV 07/2019 and was placed on Eliquis 2.5 mg for 4 weeks after, then stopped with h/o anemia.   Echo 06/2019 with LVEF 25-30% and severe LAE Echo 10/2019 with LVEF 40-45%  Patricia [name] who presented to [facility] 01/12/2021 with several weeks of worsening SOB and edema. She has had intermittent LE and abdominal edema for some time. She has been watching her HR carefully over fears of AF, and has been 90-120s at home. Called [facility] office last week and made an appointment with APP, but symptoms continued through the weekend so she presented to [facility].   Pertinent labs on admission cinlude BNP 1681, CXR with pulmonary edema, Cr 2.16, HS trop 35 with flat trend, COVID negative. Medicine admitted with plans for IV diuresis.   EKG on admission showed AF at 118 bpm with chronic LBBB  Echo this admission shows EF is again down to 25-30% range.  Currently, she is feeling a little better. She denies symptoms of SOB at rest. Denies  orthopnea, remains SOB with mild exertion. Daughter is present in room.   Past Medical History:  Diagnosis Date  . Allergy   . Anemia, unspecified   . Arthritis   . Asthma   . Atrial fibrillation status post cardioversion [facility]) 09/2015   s/p TEE/DCCV>>SR on amio  . Benign neoplasm of colon   . Carpal tunnel syndrome   . Cellulitis and abscess of finger, unspecified   . Cerumen impaction   . Cervicalgia   . CHF (congestive heart failure) ([place])   . Chronic airway obstruction, not elsewhere classified   . Chronic anticoagulation 09/2015   Eliquis  . Diarrhea   . Diffuse cystic mastopathy   . Edema   . Encounter for long-term (current) use of other medications   . GERD (gastroesophageal reflux disease)   . Heart murmur   . Lumbago   . Neck pain 05/23/2015  . Obesity, unspecified   . Other and unspecified hyperlipidemia   . Other psoriasis   . Pain in joint, lower leg   . PONV (postoperative nausea and vomiting)   . Primary localized osteoarthrosis of right shoulder 12/18/2015  . 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 Scoliosis (and kyphoscoliosis), idiopathic   . Shortness of breath dyspnea    with exertion  . Type II or unspecified type diabetes mellitus without mention of complication, uncontrolled   . Unspecified essential hypertension   .  Unspecified hemorrhoids without mention of complication   . Unspecified hypothyroidism   . Urge incontinence      Surgical History:  Past Surgical History:  Procedure Laterality Date  . ABDOMINAL HYSTERECTOMY  1987   complete  . BREAST BIOPSY Right   . BREAST EXCISIONAL BIOPSY Left 2011  . BREAST EXCISIONAL BIOPSY Right    x2  . BREAST SURGERY     right breast 3 surgeries [phone]  . CARDIAC CATHETERIZATION N/A 09/02/2016   Procedure: Right/Left Heart Cath and Coronary Angiography;  Surgeon: [name]  M Martinique, MD;  Location: [place] CV LAB;  Service: Cardiovascular;  Laterality: N/A;  . CARDIOVERSION N/A 10/19/2015   Procedure: CARDIOVERSION;  Surgeon: Lelon Perla, MD;  Location: [facility] ENDOSCOPY;  Service: Cardiovascular;  Laterality: N/A;  . CARDIOVERSION N/A 11/13/2017   Procedure: CARDIOVERSION;  Surgeon: Jerline Pain, MD;  Location: [facility] ENDOSCOPY;  Service: Cardiovascular;  Laterality: N/A;  . CARDIOVERSION N/A 07/28/2019   Procedure: CARDIOVERSION;  Surgeon: Buford Dresser, MD;  Location: [place];  Service: Cardiovascular;  Laterality: N/A;  . [place]  . COLON SURGERY     2004,2007,2013.  3 times colon surgieres  . ESOPHAGOGASTRODUODENOSCOPY (EGD) WITH PROPOFOL Left 03/13/2017   Procedure: ESOPHAGOGASTRODUODENOSCOPY (EGD) WITH PROPOFOL;  Surgeon: Ronnette Juniper, MD;  Location: [place];  Service: Gastroenterology;  Laterality: Left;  . ESOPHAGOGASTRODUODENOSCOPY (EGD) WITH PROPOFOL Left 01/22/2018   Procedure: ESOPHAGOGASTRODUODENOSCOPY (EGD) WITH PROPOFOL;  Surgeon: Arta Silence, MD;  Location[facility] END[facility]COPY;  Service: Endoscopy;  Laterality: Left;  . EXCISE LE MANDIBULAR LYMPH NODE T     DR C. NEWMAN  . FOOT SURGERY  1989  . GIVENS CAPSULE STUDY Left 01/23/2018   Procedure: GIVENS CAPSULE STUDY;  Surgeon: Wilford Corner, MD;  Location: [facility] ENDOSCOPY;  Service: Endoscopy;  Laterality: Left;  . LEFT SHOULDER ARTHROSCOPY    . 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 NEUROPLASTY / TRANSPOSITION MEDIAN NERVE AT CARPAL TUNNEL BILATERAL  05/2003  . TEE WITHOUT CARDIOVERSION N/A 10/19/2015   Procedure: Transesophageal Echocardiogram (TEE) ;  Surgeon: Lelon Perla, MD;  Location: [facility] ENDOSCOPY;  Service: Cardiovascular;  Laterality: N/A;  . TEE WITHOUT CARDIOVERSION N/A 10/14/2016   Procedure: TRANSESOPHAGEAL ECHOCARDIOGRAM (TEE);  Surgeon: Sherren Mocha, MD;  Location: [place];  Service: Open Heart Surgery;  Laterality: N/A;  . TEE WITHOUT  CARDIOVERSION N/A 07/28/2019   Procedure: TRANSESOPHAGEAL ECHOCARDIOGRAM (TEE);  Surgeon: Buford Dresser, MD;  Location: [facility] ENDOSCOPY;  Service: Cardiovascular;  Laterality: N/A;  . TOTAL SHOULDER ARTHROPLASTY Right 12/18/2015   Procedure: TOTAL SHOULDER ARTHROPLASTY;  Surgeon: [name], MD;  Location: [place];  Service: Orthopedics;  Laterality: Right;  . TRANSCATHETER AORTIC VALVE REPLACEMENT, TRANSFEMORAL N/A 10/14/2016   Procedure: TRANSCATHETER AORTIC VALVE REPLACEMENT, TRANSFEMORAL;  Surgeon: Sherren Mocha, MD;  Location: [place];  Service: Open Heart Surgery;  Laterality: N/A;  . TRIGGER FINGER RELEASE Left   . VAGINAL CYST REMOVED  1967     Medications Prior to Admission  Medication Sig Dispense Refill Last Dose  . acetaminophen (TYLENOL) 650 MG CR tablet Take 1,300 mg by mouth every 8 (eight) hours as needed for pain.   Past Month at Unknown time  . albuterol (PROVENTIL) (2.5 MG/3ML) 0.083% nebulizer solution USE 1 VIAL IN NEBULIZER EVERY 4 HOURS AND AS NEEDED. Generic: VENTOLIN (Patient taking differently: Take 2.5 mg by nebulization every 4 (four) hours as needed for shortness of breath or wheezing.) 150 mL 3 Past Month at Unknown time  .  albuterol (VENTOLIN HFA) 108 (90 Base) MCG/ACT inhaler INHALE 2 PUFFS INTO THE LUNGS EVERY 6 HOURS AS NEEDED FOR WHEEZING OR SHORTNESS OF BREATH (Patient taking differently: Inhale 2 puffs into the lungs every 6 (six) hours as needed for shortness of breath or wheezing.) 6.7 g 3 Past Month at Unknown time  . amiodarone (PACERONE) 200 MG tablet Take 1 tablet (200 mg total) by mouth daily. 30 tablet 11 01/04/2021 at Unknown time  . azelastine (ASTELIN) 0.1 % nasal spray Place 1 spray into both nostrils 2 (two) times daily. Use in each nostril as directed (Patient taking differently: Place 1 spray into both nostrils 2 (two) times daily as needed. Use in each nostril as directed) 30 mL 1 Past Month at Unknown time  . Blood Glucose Monitoring Suppl  (ONETOUCH VERIO) w/Device KIT Check blood sugar twice daily DX:E11.9 1 kit 0   . cetirizine (ZYRTEC) 10 MG tablet Take 10 mg by mouth daily as needed for allergies.    unk  . colestipol (COLESTID) 1 g tablet Take 1 g by mouth 2 (two) times daily.   01/20/2021 at Unknown time  . Dextromethorphan-guaiFENesin (MUCINEX DM) 30-600 MG TB12 Take 2 tablets by mouth as needed (cough).   Past Month at Unknown time  . doxazosin (CARDURA) 4 MG tablet TAKE 1 TABLET BY MOUTH EVERY DAY TO HELP CONTROL BLOOD PRESSURE (Patient taking differently: Take 4 mg by mouth daily.) 90 tablet 1 01/09/2021 at Unknown time  . ferrous sulfate 325 (65 FE) MG EC tablet Take 325 mg by mouth 2 (two) times daily.   01/09/2021 at Unknown time  . furosemide (LASIX) 40 MG tablet TAKE 2 TABLETS BY MOUTH DAILY AND 1 TABLET BY MOUTH AT LUNCH on  Mondays, Wednesdays and Fridays (Patient taking differently: Take 40-80 mg by mouth See admin instructions. 2 tablets in the morning  daily. Take 1 tablet at lunch on m,w,f in addition to morning dose) 270 tablet 1 01/14/2021 at Unknown time  . glimepiride (AMARYL) 1 MG tablet TAKE 1 TABLET(1 MG) BY MOUTH DAILY WITH BREAKFAST (Patient taking differently: Take 1 mg by mouth daily with breakfast.) 90 tablet 1 01/13/2021 at Unknown time  . glucose blood test strip 1 each by Other route 2 (two) times daily. OneTouch Verio DX E11.8 200 each 11   . JANUVIA 50 MG tablet TAKE 1 TABLET(50 MG) BY MOUTH DAILY (Patient taking differently: Take 50 mg by mouth daily.) 30 tablet 6 01/11/2021 at Unknown time  . Lancets (ONETOUCH ULTRASOFT) lancets 1 each by Other route 2 (two) times daily. OneTouch Verio DX E11.9 200 each 11   . levothyroxine (SYNTHROID) 88 MCG tablet TAKE 1 TABLET BY MOUTH DAILY ON AN EMPTY STOMACH (Patient taking differently: Take 88 mcg by mouth daily before breakfast.) 90 tablet 1 01/03/2021 at Unknown time  . OVER THE COUNTER MEDICATION Place 1 drop into both eyes daily. [facility] brand lubricating eye  drop   01/17/2021 at Unknown time  . OXYGEN Inhale 2 L into the lungs continuous. Pt also uses 4L pulse via POC     . pantoprazole (PROTONIX) 40 MG tablet TAKE 1 TABLET(40 MG) BY MOUTH TWICE DAILY (Patient taking differently: Take 40 mg by mouth 2 (two) times daily.) 180 tablet 1 12/28/2020 at Unknown time  . potassium gluconate 595 (99 K) MG TABS tablet Take 595 mg by mouth 2 (two) times daily.   01/07/2021 at Unknown time  . predniSONE (DELTASONE) 10 MG tablet Take 4 tabs po daily  x 3 days; then 3 tabs daily x3 days; then 2 tabs daily x3 days; then 1 tab daily x 3 days; then stop 30 tablet 0 01/21/2021 at Unknown time  . rosuvastatin (CRESTOR) 10 MG tablet TAKE 1 TABLET(10 MG) BY MOUTH DAILY (Patient taking differently: Take 10 mg by mouth daily.) 90 tablet 0 01/01/2021 at Unknown time  . TRELEGY ELLIPTA 200-62.5-25 MCG/INH AEPB INHALE 1 PUFF INTO THE LUNGS DAILY 60 each 6 01/09/2021 at Unknown time  . cholecalciferol (VITAMIN D) 1000 units tablet Take 1,000 Units by mouth at bedtime.    01/14/2021    Inpatient Medications:  . amiodarone  200 mg Oral Daily  . doxazosin  4 mg Oral Daily  . fluticasone furoate-vilanterol  1 puff Inhalation Daily   And  . umeclidinium bromide  1 puff Inhalation Daily  . heparin  5,000 Units Subcutaneous Q8H  . insulin aspart  0-5 Units Subcutaneous QHS  . insulin aspart  0-9 Units Subcutaneous TID WC  . levothyroxine  88 mcg Oral Q0600  . pantoprazole  40 mg Oral BID  . rosuvastatin  10 mg Oral Daily  . sodium chloride flush  3 mL Intravenous Q12H    Allergies:  Allergies  Allergen Reactions  . Codeine Other (See Comments)    "Spaces out" the patient and she cannot walk well  . Vesicare [Solifenacin] Itching    Social History   Socioeconomic History  . Marital status: Divorced    Spouse name: Not on file  . Number of children: 1  . Years of education: Not on file  . Highest education level: Not on file  Occupational History  . Occupation: Retired  Market researcher for [employer]  Tobacco Use  . Smoking status: Former Smoker    Packs/day: 1.00    Years: 40.00    Pack years: 40.00    Quit date: 09/07/1989    Years since quitting: 31.3  . Smokeless tobacco: Never Used  Vaping Use  . Vaping Use: Never used  Substance and Sexual Activity  . Alcohol use: No    Alcohol/week: 0.0 standard drinks  . Drug use: No  . Sexual activity: Never  Other Topics Concern  . Not on file  Social History Narrative   Her daughter & 3 grandchildren live in the area.     Social Determinants of Health   Financial Resource Strain: Not on file  Food Insecurity: Not on file  Transportation Needs: Not on file  Physical Activity: Not on file  Stress: Not on file  Social Connections: Not on file  Intimate Partner Violence: Not on file     Family History  Problem Relation Age of Onset  . Diabetes Father   . Stroke Father   . Heart disease Father   . Liver cancer Sister   . Heart disease Brother   . Asthma Sister   . Arthritis Sister   . Breast cancer Neg Hx      Review of Systems: All other systems reviewed and are otherwise negative except as noted above.  Physical Exam: Vitals:   01/17/21 0510 01/17/21 0605 01/17/21 0936 01/17/21 1028  BP: (!) 108/55   104/65  Pulse: (!) 106  (!) 112 (!) 112  Resp: (!) _0 Temp: 97.9 F (36.6 C)     TempSrc: Oral     SpO2: 99%  97% 98%  Weight:  98.8 kg    Height:        GEN-  The patient is well appearing, alert and oriented x 3 today.   HEENT: normocephalic, atraumatic; sclera clear, conjunctiva pink; hearing intact; oropharynx clear; neck supple Lungs- Clear to ausculation bilaterally, normal work of breathing.  No wheezes, rales, rhonchi Heart- Regular rate and rhythm, no murmurs, rubs or gallops GI- soft, non-tender, non-distended, bowel sounds present Extremities- no clubbing, cyanosis, or edema; DP/PT/radial pulses 2+ bilaterally MS- no significant deformity or  atrophy Skin- warm and dry, no rash or lesion Psych- euthymic mood, full affect Neuro- strength and sensation are intact  Labs:   Lab Results  Component Value Date   WBC 5.6 01/17/2021   HGB 9.4 (L) 01/17/2021   HCT 29.9 (L) 01/17/2021   MCV 94.0 01/17/2021   PLT 133 (L) 01/17/2021    Recent Labs  Lab 01/16/21 1656 01/17/21 0318  NA  --  137  K  --  4.4  CL  --  96*  CO2  --  33*  BUN  --  73*  CREATININE  --  2.81*  CALCIUM  --  8.9  PROT 6.4*  --   BILITOT 0.6  --   ALKPHOS 50  --   ALT 13  --   AST 14*  --   GLUCOSE  --  143*      Radiology/Studies: DG Chest Portable 1 View  Result Date: 01/20/2021 CLINICAL DATA:  Shortness of breath. EXAM: PORTABLE CHEST 1 VIEW COMPARISON:  Chest x-ray 12/17/2020, CT chest 05/31/2020 FINDINGS: Cardiomegaly. The heart size and mediastinal contours are unchanged. Aortic arch calcifications. Aortic valve replacement. Prominence of the hilar vasculature. Biapical pleural/pulmonary scarring. Streaky airspace opacities at the right lower lobe. No pulmonary edema. Bilateral trace to small volume pleural effusions. No pneumothorax. No acute osseous abnormality. Partially visualized right shoulder arthroplasty. IMPRESSION: 1. Pulmonary edema with bilateral trace to small volume pleural effusion. Superimposed infection/inflammation not excluded. 2. Cardiomegaly. Electronically Signed   By: Iven Finn M.D.   On: 01/09/2021 20:48   ECHOCARDIOGRAM COMPLETE  Result Date: 01/16/2021    ECHOCARDIOGRAM REPORT   Patient Name:   Patricia [name] Date of Exam: 01/16/2021 Medical Rec #:  [id number]      Height:       63.0 in Accession #:    [id number]     Weight:       209.0 lb Date of Birth:  May 03, 1936     BSA:          1.970 m Patient Age:    55 years       BP:           209/63 mmHg Patient Gender: F              HR:           107 bpm. Exam Location:  Inpatient Procedure: 2D Echo Indications:    K35.46 Acute systolic (congestive) heart failure  History:         Patient has prior history of Echocardiogram examinations, most                 recent 11/07/2019. CHF.  Sonographer:    Luisa Hart RDCS Referring Phys: [id number] [place]  1. Left ventricular ejection fraction, by estimation, is 25 to 30%. The left ventricle has severely decreased function. The left ventricle demonstrates regional wall motion abnormalities) Abnormal (paradoxical) septal motion, consistent with left bundle  branch block. There is severe left ventricular hypertrophy. Left ventricular diastolic parameters are indeterminate.  2.  Right ventricular systolic function is moderately reduced. The right ventricular size is normal. There is moderately elevated pulmonary artery systolic pressure. The estimated right ventricular systolic pressure is 82.9 mmHg.  3. Left atrial size was severely dilated.  4. Right atrial size was moderately dilated.  5. The mitral valve is abnormal. Mild to moderate mitral valve regurgitation. Severe mitral annular calcification.  6. There is a 23 mm Edwards Sapien prosthetic, stented (TAVR) valve present in the aortic position. Aortic valve regurgitation is not visualized. Aortic valve mean gradient measures 12.0 mmHg, which is reduced from prior echo 11/07/19, likely due to decreased systolic function. Vmax 2.1 m/s, MG 12 mmHg, EOA 1.1 cm^2, DI 0.44  7. The inferior vena cava is dilated in size with <50% respiratory variability, suggesting right atrial pressure of 15 mmHg. FINDINGS  Left Ventricle: Left ventricular ejection fraction, by estimation, is 25 to 30%. The left ventricle has severely decreased function. The left ventricle demonstrates regional wall motion abnormalities. 3D left ventricular ejection fraction analysis performed but not reported based on interpreter judgement due to suboptimal quality. The left ventricular internal cavity size was normal in size. There is severe left ventricular hypertrophy. Abnormal (paradoxical) septal motion,  consistent with left bundle branch block. Left ventricular diastolic parameters are indeterminate. Right Ventricle: The right ventricular size is normal. Right vetricular wall thickness was not well visualized. Right ventricular systolic function is moderately reduced. There is moderately elevated pulmonary artery systolic pressure. The tricuspid regurgitant velocity is 2.89 m/s, and with an assumed right atrial pressure of 15 mmHg, the estimated right ventricular systolic pressure is 93.7 mmHg. Left Atrium: Left atrial size was severely dilated. Right Atrium: Right atrial size was moderately dilated. Pericardium: There is no evidence of pericardial effusion. Mitral Valve: The mitral valve is abnormal. Severe mitral annular calcification. Mild to moderate mitral valve regurgitation. Tricuspid Valve: The tricuspid valve is normal in structure. Tricuspid valve regurgitation is mild. Aortic Valve: The aortic valve has been repaired/replaced. Aortic valve regurgitation is not visualized. Aortic valve mean gradient measures 12.0 mmHg. Aortic valve peak gradient measures 17.1 mmHg. Aortic valve area, by VTI measures 1.13 cm. There is a  23 mm Edwards Sapien prosthetic, stented (TAVR) valve present in the aortic position. Pulmonic Valve: The pulmonic valve was not well visualized. Pulmonic valve regurgitation is not visualized. Aorta: The aortic root and ascending aorta are structurally normal, with no evidence of dilitation. Venous: The inferior vena cava is dilated in size with less than 50% respiratory variability, suggesting right atrial pressure of 15 mmHg. IAS/Shunts: The interatrial septum was not well visualized.  LEFT VENTRICLE PLAX 2D LVIDd:         3.90 cm LVIDs:         3.40 cm LV PW:         1.80 cm LV IVS:        2.00 cm LVOT diam:     1.80 cm LV SV:         37 LV SV Index:   19 LVOT Area:     2.54 cm  LV Volumes (MOD) LV vol d, MOD A2C: 119.0 ml LV vol d, MOD A4C: 74.5 ml LV vol s, MOD A2C: 74.1 ml LV vol  s, MOD A4C: 67.8 ml LV SV MOD A2C:     44.9 ml LV SV MOD A4C:     74.5 ml LV SV MOD BP:      27.3 ml RIGHT VENTRICLE TAPSE (M-mode): 0.5 cm LEFT ATRIUM  Index        RIGHT ATRIUM           Index LA Vol (A2C):   202.0 ml 102.53 ml/m RA Area:     26.20 cm LA Vol (A4C):   139.0 ml 70.55 ml/m  RA Volume:   87.20 ml  44.26 ml/m LA Biplane Vol: 180.0 ml 91.36 ml/m  AORTIC VALVE                    PULMONIC VALVE AV Area (Vmax):    1.08 cm     PV Vmax:       0.99 m/s AV Area (Vmean):   1.00 cm     PV Vmean:      59.600 cm/s AV Area (VTI):     1.13 cm     PV VTI:        0.172 m AV Vmax:           207.00 cm/s  PV Peak grad:  3.9 mmHg AV Vmean:          156.500 cm/s PV Mean grad:  2.0 mmHg AV VTI:            0.329 m AV Peak Grad:      17.1 mmHg AV Mean Grad:      12.0 mmHg LVOT Vmax:         87.60 cm/s LVOT Vmean:        61.500 cm/s LVOT VTI:          0.146 m LVOT/AV VTI ratio: 0.44  AORTA Ao Asc diam: 2.50 cm MITRAL VALVE                 TRICUSPID VALVE MV Area (PHT): 6.27 cm      TR Peak grad:   33.4 mmHg MV Decel Time: 121 msec      TR Vmax:        289.00 cm/s MR Peak grad:    85.4 mmHg MR Mean grad:    54.0 mmHg   SHUNTS MR Vmax:         462.00 cm/s Systemic VTI:  0.15 m MR Vmean:        350.0 cm/s  Systemic Diam: 1.80 cm MR PISA:         3.08 cm MR PISA Eff ROA: 14 mm MR PISA Radius:  0.70 cm MV E velocity: 181.00 cm/s Oswaldo Milian MD Electronically signed by Oswaldo Milian MD Signature Date/Time: 01/16/2021/4:16:28 PM    Final     EKG: on arrival showed AF at 118 bpm with LBBB (personally reviewed)  EKG 12/17/20 showed NSR at 76 bpm, LBBB 184 ms  TELEMETRY: AF with 100-110s (personally reviewed)  Assessment/Plan: 1. Paroxysmal, now Persistent atrial fibrillation She is not on [place] despite CHA2DS2VASC of at least 6 in setting of chronic anemia and h/o GI bleeds due to AVMs Rates relatively stable currently, 100-110s mostly She has been maintained on po amiodarone chronically.    She appears to tolerate AF very poorly.  Rate control has also been limited by hypotension at times Not candidate for ablation based on poor tolerance of general anasthesia, COPD, and severe atriopathy.  ? Candidacy for LAA occlusion with Watchman, as she has previously tolerated short courses of [place]  2. Acute on chronic systolic CHF,  LHC 93/2355 with single vessel obstructive CAD involving OM2, otherwise non obstructive. Mod/Severe AS (Pre TAVR) EF down to 25-30%.  GDMT limited by CKD IV and hypotension  at times. In setting of wide LBBB, she would likely benefit from CRT-P.   3. CAD H/o single vessel disease as above.  ? Need for repeat cath with drop in EF, though Cr would make difficulty re: contrast load.   4. AKI on CKD IV Baseline Cr appears 1.8 - 2.0 Cr up to 2.81 with diuresis.   5. Chronic hypoxic respiratory failure on O2 Pulmonary has also been consulted to help determine if pulmonary component to her worsening SOB.   6. S/p TAVR Stable by echo 01/16/21.  Dr. Quentin Ore has seen the patient. Discussed CRT-P device and Watchman. Would likely plan on CRT-P first, then healing for 6-8 weeks prior to Watchman consideration. Timing on CRT would depend on clinical course, including the possibility of outpatient consideration. Dr. Quentin Ore will discuss further with Dr. Margaretann Loveless.  For questions or updates, please contact [place] Please consult [email or web address] for contact info under Cardiology/STEMI.  Jacalyn Lefevre, PA-C  01/17/2021 12:03 PM

## 2021-01-17 NOTE — Consult Note (Signed)
NAME:  Patricia [name], MRN:  [id number], DOB:  [birth year], LOS: 1 ADMISSION DATE:  01/02/2021, CONSULTATION DATE:  01/17/21 REFERRING MD:  Starla Link - TRH, CHIEF COMPLAINT:  SOB  History of Present Illness:  85 yo F PMH OHS, CKD IV, Afib, COPD, chronic hypoxic failure on home 2L O2, s/p TAVR, CAD HFrEF who presented to ED 3/22 for SOB. Associated hypoxia with patient noting sats 88% on 2L, bilateral lower extremity pitting edema, and fluctuating elevated HR. BNP 1681, CXR with bilateral pleural effusions and pulmonary edema. Admitted to [facility] for acute heart failure exacerbation. Got 13m lasix in ED, and total of 1432mLasix 3/23 without a robust response. ECHO obtained 3/23 which revealed LVEF 25-30%, LVH, reduced RV systolic function but normal RV size. Severely dilated LA. Moderately dilated RA.    3/24 Cr has bumped and lasix being held. Cardiology suggests PCCM consult to evaluate SOB for pulmonary etiology of SOB.  Pertinent  Medical History  OHS Moderate COPD  Chronic hypoxic respiratory failure Systolic heart failure Pulmonary hypertension  Afib AS s/p TAVR CAD DM2 CKD IV Hypothyroidism  Significant Hospital Events: Including procedures, antibiotic start and stop dates in addition to other pertinent events   . [facility] w pulm edema, pleural effusions. BNP 1600. Got 40 lasix . 3/23 cards consulted. ECHO with worse EF (25% from prior 40%) and new RV dysfunction. got higher dose lasix did not have robust response.  . 3/24 Creatinine bumped, lasix held. pulm consulted for SOB.   Interim History / Subjective:  Hypoxia seems to be improving. SpO2 98% on 3L  Cardiology has ordered a vq scan   Creatinine elevated, in this setting lasix held.   Patient feels "much better" she does not feel short of breath. She is happy that she was able to get out of bed, and is currently in a recliner  D[facility] [name], at bedside [facility]   Blood pressure  104/65, pulse (!) 112, temperature 97.9 F (36.6 C), temperature source Oral, resp. rate 20, height _0  (1.575 m), weight 98.8 kg, SpO2 98 %.    FiO2 (%):  [32 %] 32 %   Intake/Output Summary (Last 24 hours) at 01/17/2021 1211 Last data filed at 01/16/2021 2229 Gross per 24 hour  Intake 477 ml  Output 575 ml  Net -98 ml   Filed Weights   01/16/21 1500 01/16/21 1506 01/17/21 0605  Weight: 102.9 kg 102.9 kg 98.8 kg    Examination: General: chronically ill elderly obese F. Seated in recliner NAD HENT: NCAT 3LNC Lungs: Bilateral crackles. Symmetrical chest expansion no accessory use Cardiovascular: s1s2 cap refill <3 Abdomen: Obese soft  Extremities: BLE 1+ pitting edema No acute joint deformity Neuro: AAOx4 following commands  GU: purewick, clear yellow urine   Labs/imaging that I havepersonally reviewed   BNP 1681  (3/22)  Cr 2.16 to 2.81 (3/23 to 3/24)   CXR 3/22 - pulmonary edema, bilateral pleural effusions ECHO 3/23- LVEF 25-30% LVH. Moderated reduced RV systolic funciton, normal RV side. Severely dilated LA moderately tolerated RA. RVSP 48.4  RUQ [place]/28/21> mild hepatic steatosis. R 1.7 x 1.7 x 1.5 cm renal lesion read as possible cyst but cannot exclude neoplasm)   Resolved Hospital Problem list     Assessment & Plan:   Broadly, I think the patient is experiencing progression (and the overlap) of multiple advanced chronic conditions vs an isolated problem. Given the nature of  these chronic conditions, a palliative care consult would likely be of benefit to establish goals of care.  Regarding acute management:   Acute on chronic hypoxic respiratory failure  -COPD on 2L -OHS  -acute component seems to be related to pulmonary edema // heart failure -really low suspicion for infectious process like PNA. With CKD no really utility for checking a PCT but no leukocytosis or fever.  -doesn't have clinical features of AECOPD  P -CXR 3/24 pending -- follow up  -pulm  hygiene -Supplemental O2 for SpO2 88-92% -think that a vq scan is likely low yield -- doubt PE -Diurese  -will try CPAP but pt has severe anxiety about this, not sure she will tolerate.  -BDs    Acute on chronic heart systolic heart failure with new biventricular dysfunction -elevated BNP -worse LVEF (25% from prior 40%) -new RV dysfunction -- suspect this could from OSA/OHS  P - Needs diuresis.  - cardiology is following   AKI on CKD IV -I think we are essentially seeing cardiorenal syndrome  R Renal lesion (1.7 cm lesion, possible complex cyst) -seen on CT a/p and RUQ [place] in 2021 P -would diurese; Cr bump somewhat expected with high dose lasix -trend I/O   Afib -not on anticoagulation with hx of GIB   -amio  -cardiology is following and plans to consult EP   Best practice (evaluated daily)  Diet:  Oral Pain/Anxiety/Delirium protocol (if indicated): No VAP protocol (if indicated): Not indicated DVT prophylaxis: SCD GI prophylaxis: N/A Glucose control:  SSI Yes Central venous access:  N/A Arterial line:  N/A Foley:  N/A Mobility:  OOB  PT consulted: N/A Last date of multidisciplinary goals of care discussion: per primary Code Status:  full code Disposition: tele   Labs   CBC: Recent Labs  Lab 01/06/2021 1904 01/16/21 0437 01/17/21 0318  WBC 5.8 6.0 5.6  NEUTROABS 5.3  --  4.2  HGB 10.2* 9.4* 9.4*  HCT 34.2* 31.6* 29.9*  MCV 95.3 97.2 94.0  PLT 150 138* 133*    Basic Metabolic Panel: Recent Labs  Lab 01/23/2021 1904 01/12/2021 2104 01/16/21 0437 01/17/21 0318  NA 137  --  137 137  K 5.7* 5.0 4.6 4.4  CL 97*  --  97* 96*  CO2 31  --  31 33*  GLUCOSE 223*  --  190* 143*  BUN 60*  --  63* 73*  CREATININE 2.18*  --  2.16* 2.81*  CALCIUM 9.4  --  9.2 8.9  MG  --   --  1.9 1.9   GFR: Estimated Creatinine Clearance: 17 mL/min (A) (by C-G formula based on SCr of 2.81 mg/dL (H)). Recent Labs  Lab 01/07/2021 1904 01/16/21 0437 01/17/21 0318  WBC 5.8 6.0  5.6    Liver Function Tests: Recent Labs  Lab 01/21/2021 1904 01/16/21 1656  AST 29 14*  ALT 18 13  ALKPHOS 55 50  BILITOT 0.7 0.6  PROT 7.1 6.4*  ALBUMIN 3.8 3.4*   No results for input(s): LIPASE, AMYLASE in the last 168 hours. No results for input(s): AMMONIA in the last 168 hours.  ABG    Component Value Date/Time   PHART 7.366 06/12/2020 1018   PCO2ART 57.9 (H) 06/12/2020 1018   PO2ART 84.9 06/12/2020 1018   HCO3 32.3 (H) 06/12/2020 1018   TCO2 20 (L) 01/20/2018 1513   O2SAT 96.6 06/12/2020 1018     Coagulation Profile: No results for input(s): INR, PROTIME in the last 168 hours.  Cardiac Enzymes: No results for input(s): CKTOTAL, CKMB, CKMBINDEX, TROPONINI in the last 168 hours.  HbA1C: Hgb A1c MFr Bld  Date/Time Value Ref Range Status  01/16/2021 04:37 AM 7.1 (H) 4.8 - 5.6 % Final    Comment:    (NOTE) Pre diabetes:          5.7%-6.4%  Diabetes:              >6.4%  Glycemic control for   <7.0% adults with diabetes   09/17/2020 12:00 AM 7.3 (H) <5.7 % of total Hgb Final    Comment:    For someone without known diabetes, a hemoglobin A1c value of 6.5% or greater indicates that they may have  diabetes and this should be confirmed with a follow-up  test. . For someone with known diabetes, a value <7% indicates  that their diabetes is well controlled and a value  greater than or equal to 7% indicates suboptimal  control. A1c targets should be individualized based on  duration of diabetes, age, comorbid conditions, and  other considerations. . Currently, no consensus exists regarding use of hemoglobin A1c for diagnosis of diabetes for children. .     CBG: Recent Labs  Lab 01/16/21 1110 01/16/21 1558 01/16/21 2137 01/17/21 0536 01/17/21 1142  GLUCAP 86 137* 151* 158* 126*    Review of Systems:   + SOB + DOE + fatigue  + irregular HR  + BLE edema + flank edema   Notable negatives: -chest pain - chest palpitations -dizziness - syncope    Full ROS performed. All others negative   Past Medical History:  She,  has a past medical history of Allergy, Anemia, unspecified, Arthritis, Asthma, Atrial fibrillation status post cardioversion [facility]) (09/2015), Benign neoplasm of colon, Carpal tunnel syndrome, Cellulitis and abscess of finger, unspecified, Cerumen impaction, Cervicalgia, CHF (congestive heart failure) ([place]), Chronic airway obstruction, not elsewhere classified, Chronic anticoagulation (09/2015), Diarrhea, Diffuse cystic mastopathy, Edema, Encounter for long-term (current) use of other medications, GERD (gastroesophageal reflux disease), Heart murmur, Lumbago, Neck pain (05/23/2015), Obesity, unspecified, Other and unspecified hyperlipidemia, Other psoriasis, Pain in joint, lower leg, PONV (postoperative nausea and vomiting), Primary localized osteoarthrosis of right shoulder (12/18/2015), S/P TAVR (transcatheter aortic valve replacement) (10/14/2016), Scoliosis (and kyphoscoliosis), idiopathic, Shortness of breath dyspnea, Type II or unspecified type diabetes mellitus without mention of complication, uncontrolled, Unspecified essential hypertension, Unspecified hemorrhoids without mention of complication, Unspecified hypothyroidism, and Urge incontinence.   Surgical History:   Past Surgical History:  Procedure Laterality Date  . ABDOMINAL HYSTERECTOMY  1987   complete  . BREAST BIOPSY Right   . BREAST EXCISIONAL BIOPSY Left 2011  . BREAST EXCISIONAL BIOPSY Right    x2  . BREAST SURGERY     right breast 3 surgeries [phone]  . CARDIAC CATHETERIZATION N/A 09/02/2016   Procedure: Right/Left Heart Cath and Coronary Angiography;  Surgeon: [name] M Martinique, MD;  Location: [place] CV LAB;  Service: Cardiovascular;  Laterality: N/A;  . CARDIOVERSION N/A 10/19/2015   Procedure: CARDIOVERSION;  Surgeon: Lelon Perla, MD;  Location: [facility] ENDOSCOPY;  Service: Cardiovascular;  Laterality: N/A;  . CARDIOVERSION N/A 11/13/2017    Procedure: CARDIOVERSION;  Surgeon: Jerline Pain, MD;  Location: [facility] ENDOSCOPY;  Service: Cardiovascular;  Laterality: N/A;  . CARDIOVERSION N/A 07/28/2019   Procedure: CARDIOVERSION;  Surgeon: Buford Dresser, MD;  Location: [place];  Service: Cardiovascular;  Laterality: N/A;  . [place]  . COLON SURGERY  [phone].  3 times colon surgieres  . ESOPHAGOGASTRODUODENOSCOPY (EGD) WITH PROPOFOL Left 03/13/2017   Procedure: ESOPHAGOGASTRODUODENOSCOPY (EGD) WITH PROPOFOL;  Surgeon: Ronnette Juniper, MD;  Location: [place];  Service: Gastroenterology;  Laterality: Left;  . ESOPHAGOGASTRODUODENOSCOPY (EGD) WITH PROPOFOL Left 01/22/2018   Procedure: ESOPHAGOGASTRODUODENOSCOPY (EGD) WITH PROPOFOL;  Surgeon: Arta Silence, MD;  Location: [facility] ENDOSCOPY;  Service: Endoscopy;  Laterality: Left;  . EXCISE LE MANDIBULAR LYMPH NODE T     DR C. NEWMAN  . FOOT SURGERY  1989  . GIVENS CAPSULE STUDY Left 01/23/2018   Procedure: GIVENS CAPSULE STUDY;  Surgeon: Wilford Corner, MD;  Location: [facility] ENDOSCOPY;  Service: Endoscopy;  Laterality: Left;  . LEFT SHOULDER ARTHROSCOPY    . 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 NEUROPLASTY / TRANSPOSITION MEDIAN NERVE AT CARPAL TUNNEL BILATERAL  05/2003  . TEE WITHOUT CARDIOVERSION N/A 10/19/2015   Procedure: Transesophageal Echocardiogram (TEE) ;  Surgeon: Lelon Perla, MD;  Location: [facility] ENDOSCOPY;  Service: Cardiovascular;  Laterality: N/A;  . TEE WITHOUT CARDIOVERSION N/A 10/14/2016   Procedure: TRANSESOPHAGEAL ECHOCARDIOGRAM (TEE);  Surgeon: Sherren Mocha, MD;  Location: [place];  Service: Open Heart Surgery;  Laterality: N/A;  . TEE WITHOUT CARDIOVERSION N/A 07/28/2019   Procedure: TRANSESOPHAGEAL ECHOCARDIOGRAM (TEE);  Surgeon: Buford Dresser, MD;  Location: [facility] ENDOSCOPY;  Service: Cardiovascular;  Laterality: N/A;  . TOTAL SHOULDER ARTHROPLASTY Right 12/18/2015   Procedure: TOTAL SHOULDER ARTHROPLASTY;  Surgeon: [name], MD;  Location: [place];  Service: Orthopedics;  Laterality: Right;  . TRANSCATHETER AORTIC VALVE REPLACEMENT, TRANSFEMORAL N/A 10/14/2016   Procedure: TRANSCATHETER AORTIC VALVE REPLACEMENT, TRANSFEMORAL;  Surgeon: Sherren Mocha, MD;  Location: [place];  Service: Open Heart Surgery;  Laterality: N/A;  . TRIGGER FINGER RELEASE Left   . VAGINAL CYST REMOVED  1967     Social History:   reports that she quit smoking about 31 years ago. She has a 40.00 pack-year smoking history. She has never used smokeless tobacco. She reports that she does not drink alcohol and does not use drugs.   Family History:  Her family history includes Arthritis in her sister; Asthma in her sister; Diabetes in her father; Heart disease in her brother and father; Liver cancer in her sister; Stroke in her father. There is no history of Breast cancer.   Allergies Allergies  Allergen Reactions  . Codeine Other (See Comments)    "Spaces out" the patient and she cannot walk well  . Vesicare [Solifenacin] Itching     Home Medications  Prior to Admission medications   Medication Sig Start Date End Date Taking? Authorizing Provider  acetaminophen (TYLENOL) 650 MG CR tablet Take 1,300 mg by mouth every 8 (eight) hours as needed for pain.   Yes [provider]  albuterol (PROVENTIL) (2.5 MG/3ML) 0.083% nebulizer solution USE 1 VIAL IN NEBULIZER EVERY 4 HOURS AND AS NEEDED. Generic: VENTOLIN Patient taking differently: Take 2.5 mg by nebulization every 4 (four) hours as needed for shortness of breath or wheezing. 07/12/20  Yes Brand Males, MD  albuterol (VENTOLIN HFA) 108 (90 Base) MCG/ACT inhaler INHALE 2 PUFFS INTO THE LUNGS EVERY 6 HOURS AS NEEDED FOR WHEEZING OR SHORTNESS OF BREATH Patient taking differently: Inhale 2 puffs into the lungs every 6 (six) hours as needed for shortness of breath or wheezing. 10/10/20  Yes [name], NP  amiodarone (PACERONE) 200 MG tablet Take 1 tablet (200 mg total)  by mouth daily. 06/29/20  Yes Martinique, [name] M, MD  azelastine (ASTELIN) 0.1 %  nasal spray Place 1 spray into both nostrils 2 (two) times daily. Use in each nostril as directed Patient taking differently: Place 1 spray into both nostrils 2 (two) times daily as needed. Use in each nostril as directed 03/02/20  Yes Martyn Ehrich, NP  Blood Glucose Monitoring Suppl [facility] VERIO) w/Device KIT Check blood sugar twice daily DX:E11.9 10/12/19  Yes [name], NP  cetirizine (ZYRTEC) 10 MG tablet Take 10 mg by mouth daily as needed for allergies.    Yes [provider]  colestipol (COLESTID) 1 g tablet Take 1 g by mouth 2 (two) times daily. 06/29/20  Yes [provider]  Dextromethorphan-guaiFENesin (MUCINEX DM) 30-600 MG TB12 Take 2 tablets by mouth as needed (cough).   Yes [provider]  doxazosin (CARDURA) 4 MG tablet TAKE 1 TABLET BY MOUTH EVERY DAY TO HELP CONTROL BLOOD PRESSURE Patient taking differently: Take 4 mg by mouth daily. 09/12/20  Yes [name], NP  ferrous sulfate 325 (65 FE) MG EC tablet Take 325 mg by mouth 2 (two) times daily.   Yes [provider]  furosemide (LASIX) 40 MG tablet TAKE 2 TABLETS BY MOUTH DAILY AND 1 TABLET BY MOUTH AT LUNCH on  Mondays, Wednesdays and Fridays Patient taking differently: Take 40-80 mg by mouth See admin instructions. 2 tablets in the morning  daily. Take 1 tablet at lunch on m,w,f in addition to morning dose 09/17/20  Yes Eubanks, Carlos American, NP  glimepiride (AMARYL) 1 MG tablet TAKE 1 TABLET(1 MG) BY MOUTH DAILY WITH BREAKFAST Patient taking differently: Take 1 mg by mouth daily with breakfast. 11/19/20  Yes [name], NP  glucose blood test strip 1 each by Other route 2 (two) times daily. OneTouch Verio DX E11.8 10/12/19  Yes Eubanks, Carlos American, NP  JANUVIA 50 MG tablet TAKE 1 TABLET(50 MG) BY MOUTH DAILY Patient taking differently: Take 50 mg by mouth daily. 10/10/20  Yes [name],  NP  Lancets [facility] ULTRASOFT) lancets 1 each by Other route 2 (two) times daily. OneTouch Verio DX E11.9 10/12/19  Yes [name], NP  levothyroxine (SYNTHROID) 88 MCG tablet TAKE 1 TABLET BY MOUTH DAILY ON AN EMPTY STOMACH Patient taking differently: Take 88 mcg by mouth daily before breakfast. 08/29/20  Yes Eubanks, Carlos American, NP  OVER THE COUNTER MEDICATION Place 1 drop into both eyes daily. [facility] brand lubricating eye drop   Yes [provider]  OXYGEN Inhale 2 L into the lungs continuous. Pt also uses 4L pulse via POC   Yes [provider]  pantoprazole (PROTONIX) 40 MG tablet TAKE 1 TABLET(40 MG) BY MOUTH TWICE DAILY Patient taking differently: Take 40 mg by mouth 2 (two) times daily. 07/11/20  Yes [name], NP  potassium gluconate 595 (99 K) MG TABS tablet Take 595 mg by mouth 2 (two) times daily.   Yes [provider]  predniSONE (DELTASONE) 10 MG tablet Take 4 tabs po daily x 3 days; then 3 tabs daily x3 days; then 2 tabs daily x3 days; then 1 tab daily x 3 days; then stop 01/14/21  Yes Martyn Ehrich, NP  rosuvastatin (CRESTOR) 10 MG tablet TAKE 1 TABLET(10 MG) BY MOUTH DAILY Patient taking differently: Take 10 mg by mouth daily. 10/31/20  Yes [name], NP  TRELEGY ELLIPTA 200-62.5-25 MCG/INH AEPB INHALE 1 PUFF INTO THE LUNGS DAILY 10/15/20  Yes Martyn Ehrich, NP  cholecalciferol (VITAMIN D) 1000 units tablet Take  1,000 Units by mouth at bedtime.     [provider]      Eliseo Gum MSN, AGACNP-BC [place] [phone] If no answer, [phone] 01/17/2021, 2:26 PM

## 2021-01-17 NOTE — Progress Notes (Signed)
Heart Failure Stewardship Pharmacist Progress Note   PCP: [name], NP PCP-Cardiologist: [name] Martinique, MD    HPI:  85 yo F with PMH of CAD, aortic stenosis, CHF, atrial fibrillation, GIB, HTN, HLD, T2DM, CKD IV, anemia, and obesity. She presented to the ED on 01/16/2021 with shortness of breath, othopnea, and peripheral edema. She was then admitted for acute on chronic systolic CHF. An ECHO was done on 01/16/21 and LVEF was 25-30% (down from 40-45% in Jan 2021).   Current HF Medications: None   Prior to admission HF Medications: Furosemide 80 mg qAM, 40 mg qPM, and 40 mg with lunch on MWF  Pertinent Lab Values: . Serum creatinine 2.16>2.81, BUN 73, Potassium 4.4, Sodium 137, BNP 1681.6, Magnesium 1.9   Vital Signs: . Weight: 217 lbs (admission weight: 226 lbs) . Blood pressure: 100/60s  . Heart rate: 110s  Medication Assistance / Insurance Benefits Check: Does the patient have prescription insurance?  Yes Type of insurance plan: UHC Medicare  Does the patient qualify for medication assistance through manufacturers or grants?   Pending . Eligible grants and/or patient assistance programs: pending . Medication assistance applications in progress: none  . Medication assistance applications approved: none Approved medication assistance renewals will be completed by: pending  Outpatient Pharmacy:  Prior to admission outpatient pharmacy: [facility] Is the patient willing to use [place] at discharge? Yes Is the patient willing to transition their outpatient pharmacy to utilize a [facility] outpatient pharmacy?   Pending    Assessment: 1. Acute on chronic systolic CHF (EF 59-45%), due to ICM. NYHA class III symptoms. - IV lasix dosing per cardiology - Consider starting low dose HF BB once euvolemic and no need for further diuresis - Consider starting Entresto/losartan once SCr improves - Consider starting spironolactone prior to discharge pending SCr and BP trends   - Also taking Januvia PTA - increased risk for HF. Would switch to Farxiga/Jardiance prior to discharge (pending SCr improvement) and stop Januvia.   Plan: 1) Medication changes recommended at this time: - Continue current regimen - Could start low dose BB if no further diuresis is required - GDMT limited by renal dysfunction  2) Patient assistance: - Farxiga/Jardiance copay $47 per month - Entresto copay $47 per month  3)  Education  - To be completed prior to discharge  Kerby Nora, PharmD, BCPS Heart Failure Cytogeneticist Phone [phone]

## 2021-01-17 NOTE — Progress Notes (Signed)
Patient ID: Patricia [name], female   DOB: 30-Jun-1936, 85 y.o.   MRN: [id number]  PROGRESS NOTE    Patricia [name]  [id number]:[id number] DOB: 1936-02-10 DOA: 01/03/2021 PCP: [name], NP   Brief Narrative:  85 y.o. female with medical history significant for chronic systolic CHF (EF improved from 25-30% to 40-45% on last TTE 11/07/2019), chronic respiratory failure with hypoxia on 2 L supplemental O2 via [place] at all times, paroxysmal atrial fibrillation on amiodarone, COPD, aortic stenosis s/p TAVR, CKD stage IV, T2DM, HTN, hypothyroidism, HLD, LBBB, and OHS not on BiPAP or CPAP presented with worsening shortness of breath.  On presentation, creatinine was 2.18, BNP of 1681.6 with high-sensitivity troponin of 35.  COVID-19 test was negative.  Chest x-ray showed pulmonary edema and bilateral pleural effusions with cardiomegaly.  She was given IV Lasix.  Assessment & Plan:   Acute on chronic systolic CHF -Presented with orthopnea, DOE, peripheral edema.  Last EF 40-45% on 11/07/2019.  BNP >1600.  Troponin minimally elevated likely due to demand ischemia. -Strict input and output.  Daily weights.  Fluid restriction.   -Echo shows EF of 25 to 30% with mild to moderate mitral regurgitation. -Follow cardiology recommendations -will hold Lasix dose today till cardiology reevaluation because of rising creatinine  Acute on chronic respiratory failure with hypoxia -Wears 2 L oxygen via nasal cannula at the time at home.  Required 4 L on presentation in the ED.  Currently on 3 L oxygen.  Acute kidney injury on chronic kidney disease stage IV -Probably from cardiorenal syndrome.  Creatinine worsening to 2.81 today.  Diuretic plan as above.  Monitor creatinine.  COPD -Currently stable.  Continue home inhalers  Paroxysmal A. fib -Currently tachycardic.  Continue amiodarone.  Not on anticoagulation due to history of GI bleed  History of aortic stenosis status post TAVR -Cardiology  following.  Diabetes mellitus type II -Home Januvia and Amaryl on hold for now.  Continue CBGs with SSI.  A1c 7.1  Hypertension -Blood pressure on the lower side.  Continue Lasix and doxazosin  Hyperlipidemia Continue statin  Anemia of chronic disease -From renal failure.  Hemoglobin stable.  Monitor  Thrombocytopenia -Questionable cause.  Monitor  Obesity hypoventilation syndrome -Outpatient sleep study was recommended, not completed and not currently on CPAP or BiPAP as an outpatient.  Generalized deconditioning -PT eval  DVT prophylaxis: Subcutaneous heparin Code Status: Full Family Communication: None at bedside Disposition Plan: Status is: Inpatient because: Inpatient level of care appropriate due to severity of illness  Dispo: The patient is from: Home              Anticipated d/c is to: Home              Patient currently is not medically stable to d/c.   Difficult to place patient No   Consultants: Cardiology  Procedures: Echo   Antimicrobials: None   Subjective: Patient seen and examined at bedside.  Denies any current chest pain.  States that she is having a hard time breathing.  No overnight fever or vomiting reported. Objective: Vitals:   01/16/21 1930 01/17/21 0000 01/17/21 0510 01/17/21 0605  BP: 101/67 103/69 (!) 108/55   Pulse: (!) 105 (!) 104 (!) 106   Resp: 20 20 (!) 21   Temp: 98.3 F (36.8 C) 97.8 F (36.6 C) 97.9 F (36.6 C)   TempSrc: Oral Oral Oral   SpO2: 98% 99% 99%   Weight:    98.8 kg  Height:  Intake/Output Summary (Last 24 hours) at 01/17/2021 0711 Last data filed at 01/16/2021 2229 Gross per 24 hour  Intake 477 ml  Output 575 ml  Net -98 ml   Filed Weights   01/16/21 1500 01/16/21 1506 01/17/21 0605  Weight: 102.9 kg 102.9 kg 98.8 kg    Examination:  General exam: No acute distress.  On 3 L oxygen via nasal cannula currently.  Elderly female/chronically ill looking. Respiratory system: Decreased breath sounds  at bases bilaterally with basilar crackles, intermittently tachypneic  cardiovascular system: Tachycardic, S1-S2 heard Gastrointestinal system: Abdomen is mildly distended, soft and nontender.  Bowel sounds are heard  extremities: Bilateral lower extremity edema present; no clubbing Central nervous system: Awake and alert.  No focal neurological deficits.  Moves extremities  skin: No obvious ecchymosis/lesions Psychiatry: Affect is flat    Data Reviewed: I have personally reviewed following labs and imaging studies  CBC: Recent Labs  Lab 01/14/2021 1904 01/16/21 0437 01/17/21 0318  WBC 5.8 6.0 5.6  NEUTROABS 5.3  --  4.2  HGB 10.2* 9.4* 9.4*  HCT 34.2* 31.6* 29.9*  MCV 95.3 97.2 94.0  PLT 150 138* 485*   Basic Metabolic Panel: Recent Labs  Lab 01/10/2021 1904 12/26/2020 2104 01/16/21 0437 01/17/21 0318  NA 137  --  137 137  K 5.7* 5.0 4.6 4.4  CL 97*  --  97* 96*  CO2 31  --  31 33*  GLUCOSE 223*  --  190* 143*  BUN 60*  --  63* 73*  CREATININE 2.18*  --  2.16* 2.81*  CALCIUM 9.4  --  9.2 8.9  MG  --   --  1.9 1.9   GFR: Estimated Creatinine Clearance: 17 mL/min (A) (by C-G formula based on SCr of 2.81 mg/dL (H)). Liver Function Tests: Recent Labs  Lab 01/12/2021 1904 01/16/21 1656  AST 29 14*  ALT 18 13  ALKPHOS 55 50  BILITOT 0.7 0.6  PROT 7.1 6.4*  ALBUMIN 3.8 3.4*   No results for input(s): LIPASE, AMYLASE in the last 168 hours. No results for input(s): AMMONIA in the last 168 hours. Coagulation Profile: No results for input(s): INR, PROTIME in the last 168 hours. Cardiac Enzymes: No results for input(s): CKTOTAL, CKMB, CKMBINDEX, TROPONINI in the last 168 hours. BNP (last 3 results) No results for input(s): PROBNP in the last 8760 hours. HbA1C: Recent Labs    01/16/21 0437  HGBA1C 7.1*   CBG: Recent Labs  Lab 01/16/21 0726 01/16/21 1110 01/16/21 1558 01/16/21 2137 01/17/21 0536  GLUCAP 156* 86 137* 151* 158*   Lipid Profile: No results for  input(s): CHOL, HDL, LDLCALC, TRIG, CHOLHDL, LDLDIRECT in the last 72 hours. Thyroid Function Tests: Recent Labs    01/16/21 1656  TSH 9.138*   Anemia Panel: No results for input(s): VITAMINB12, FOLATE, FERRITIN, TIBC, IRON, RETICCTPCT in the last 72 hours. Sepsis Labs: No results for input(s): PROCALCITON, LATICACIDVEN in the last 168 hours.  Recent Results (from the past 240 hour(s))  Resp Panel by RT-PCR (Flu A&B, Covid) Nasopharyngeal Swab     Status: None   Collection Time: 01/04/2021  9:04 PM   Specimen: Nasopharyngeal Swab; Nasopharyngeal(NP) swabs in vial transport medium  Result Value Ref Range Status   SARS Coronavirus 2 by RT PCR NEGATIVE NEGATIVE Final    Comment: (NOTE) SARS-CoV-2 target nucleic acids are NOT DETECTED.  The SARS-CoV-2 RNA is generally detectable in upper respiratory specimens during the acute phase of infection. The lowest concentration of SARS-CoV-2  viral copies this assay can detect is 138 copies/mL. A negative result does not preclude SARS-Cov-2 infection and should not be used as the sole basis for treatment or other patient management decisions. A negative result may occur with  improper specimen collection/handling, submission of specimen other than nasopharyngeal swab, presence of viral mutation(s) within the areas targeted by this assay, and inadequate number of viral copies(<138 copies/mL). A negative result must be combined with clinical observations, patient history, and epidemiological information. The expected result is Negative.  Fact Sheet for Patients:  EntrepreneurPulse.com.au  Fact Sheet for Healthcare Providers:  IncredibleEmployment.be  This test is no t yet approved or cleared by the [place] FDA and  has been authorized for detection and/or diagnosis of SARS-CoV-2 by FDA under an Emergency Use Authorization (EUA). This EUA will remain  in effect (meaning this test can be used) for the  duration of the COVID-19 declaration under Section 564(b)(1) of the Act, 21 U.S.C.section 360bbb-3(b)(1), unless the authorization is terminated  or revoked sooner.       Influenza A by PCR NEGATIVE NEGATIVE Final   Influenza B by PCR NEGATIVE NEGATIVE Final    Comment: (NOTE) The Xpert Xpress SARS-CoV-2/FLU/RSV plus assay is intended as an aid in the diagnosis of influenza from Nasopharyngeal swab specimens and should not be used as a sole basis for treatment. Nasal washings and aspirates are unacceptable for Xpert Xpress SARS-CoV-2/FLU/RSV testing.  Fact Sheet for Patients: EntrepreneurPulse.com.au  Fact Sheet for Healthcare Providers: IncredibleEmployment.be  This test is not yet approved or cleared by the [place] FDA and has been authorized for detection and/or diagnosis of SARS-CoV-2 by FDA under an Emergency Use Authorization (EUA). This EUA will remain in effect (meaning this test can be used) for the duration of the COVID-19 declaration under Section 564(b)(1) of the Act, 21 U.S.C. section 360bbb-3(b)(1), unless the authorization is terminated or revoked.  Performed at [facility], [place] [address] [place], [place] [address]          Radiology Studies: DG Chest Portable 1 View  Result Date: 01/07/2021 CLINICAL DATA:  Shortness of breath. EXAM: PORTABLE CHEST 1 VIEW COMPARISON:  Chest x-ray 12/17/2020, CT chest 05/31/2020 FINDINGS: Cardiomegaly. The heart size and mediastinal contours are unchanged. Aortic arch calcifications. Aortic valve replacement. Prominence of the hilar vasculature. Biapical pleural/pulmonary scarring. Streaky airspace opacities at the right lower lobe. No pulmonary edema. Bilateral trace to small volume pleural effusions. No pneumothorax. No acute osseous abnormality. Partially visualized right shoulder arthroplasty. IMPRESSION: 1. Pulmonary edema with bilateral trace to small volume pleural  effusion. Superimposed infection/inflammation not excluded. 2. Cardiomegaly. Electronically Signed   By: Iven Finn M.D.   On: 01/04/2021 20:48   ECHOCARDIOGRAM COMPLETE  Result Date: 01/16/2021    ECHOCARDIOGRAM REPORT   Patient Name:   Patricia [name] Date of Exam: 01/16/2021 Medical Rec #:  [id number]      Height:       63.0 in Accession #:    [id number]     Weight:       209.0 lb Date of Birth:  Mar 05, 1936     BSA:          1.970 m Patient Age:    16 years       BP:           209/63 mmHg Patient Gender: F              HR:  107 bpm. Exam Location:  Inpatient Procedure: 2D Echo Indications:    V95.63 Acute systolic (congestive) heart failure  History:        Patient has prior history of Echocardiogram examinations, most                 recent 11/07/2019. CHF.  Sonographer:    Luisa Hart RDCS Referring Phys: [id number] [place]  1. Left ventricular ejection fraction, by estimation, is 25 to 30%. The left ventricle has severely decreased function. The left ventricle demonstrates regional wall motion abnormalities) Abnormal (paradoxical) septal motion, consistent with left bundle  branch block. There is severe left ventricular hypertrophy. Left ventricular diastolic parameters are indeterminate.  2. Right ventricular systolic function is moderately reduced. The right ventricular size is normal. There is moderately elevated pulmonary artery systolic pressure. The estimated right ventricular systolic pressure is 29.5 mmHg.  3. Left atrial size was severely dilated.  4. Right atrial size was moderately dilated.  5. The mitral valve is abnormal. Mild to moderate mitral valve regurgitation. Severe mitral annular calcification.  6. There is a 23 mm Edwards Sapien prosthetic, stented (TAVR) valve present in the aortic position. Aortic valve regurgitation is not visualized. Aortic valve mean gradient measures 12.0 mmHg, which is reduced from prior echo 11/07/19, likely due to decreased systolic  function. Vmax 2.1 m/s, MG 12 mmHg, EOA 1.1 cm^2, DI 0.44  7. The inferior vena cava is dilated in size with <50% respiratory variability, suggesting right atrial pressure of 15 mmHg. FINDINGS  Left Ventricle: Left ventricular ejection fraction, by estimation, is 25 to 30%. The left ventricle has severely decreased function. The left ventricle demonstrates regional wall motion abnormalities. 3D left ventricular ejection fraction analysis performed but not reported based on interpreter judgement due to suboptimal quality. The left ventricular internal cavity size was normal in size. There is severe left ventricular hypertrophy. Abnormal (paradoxical) septal motion, consistent with left bundle branch block. Left ventricular diastolic parameters are indeterminate. Right Ventricle: The right ventricular size is normal. Right vetricular wall thickness was not well visualized. Right ventricular systolic function is moderately reduced. There is moderately elevated pulmonary artery systolic pressure. The tricuspid regurgitant velocity is 2.89 m/s, and with an assumed right atrial pressure of 15 mmHg, the estimated right ventricular systolic pressure is 18.8 mmHg. Left Atrium: Left atrial size was severely dilated. Right Atrium: Right atrial size was moderately dilated. Pericardium: There is no evidence of pericardial effusion. Mitral Valve: The mitral valve is abnormal. Severe mitral annular calcification. Mild to moderate mitral valve regurgitation. Tricuspid Valve: The tricuspid valve is normal in structure. Tricuspid valve regurgitation is mild. Aortic Valve: The aortic valve has been repaired/replaced. Aortic valve regurgitation is not visualized. Aortic valve mean gradient measures 12.0 mmHg. Aortic valve peak gradient measures 17.1 mmHg. Aortic valve area, by VTI measures 1.13 cm. There is a  23 mm Edwards Sapien prosthetic, stented (TAVR) valve present in the aortic position. Pulmonic Valve: The pulmonic valve was  not well visualized. Pulmonic valve regurgitation is not visualized. Aorta: The aortic root and ascending aorta are structurally normal, with no evidence of dilitation. Venous: The inferior vena cava is dilated in size with less than 50% respiratory variability, suggesting right atrial pressure of 15 mmHg. IAS/Shunts: The interatrial septum was not well visualized.  LEFT VENTRICLE PLAX 2D LVIDd:         3.90 cm LVIDs:         3.40 cm LV PW:  1.80 cm LV IVS:        2.00 cm LVOT diam:     1.80 cm LV SV:         37 LV SV Index:   19 LVOT Area:     2.54 cm  LV Volumes (MOD) LV vol d, MOD A2C: 119.0 ml LV vol d, MOD A4C: 74.5 ml LV vol s, MOD A2C: 74.1 ml LV vol s, MOD A4C: 67.8 ml LV SV MOD A2C:     44.9 ml LV SV MOD A4C:     74.5 ml LV SV MOD BP:      27.3 ml RIGHT VENTRICLE TAPSE (M-mode): 0.5 cm LEFT ATRIUM              Index        RIGHT ATRIUM           Index LA Vol (A2C):   202.0 ml 102.53 ml/m RA Area:     26.20 cm LA Vol (A4C):   139.0 ml 70.55 ml/m  RA Volume:   87.20 ml  44.26 ml/m LA Biplane Vol: 180.0 ml 91.36 ml/m  AORTIC VALVE                    PULMONIC VALVE AV Area (Vmax):    1.08 cm     PV Vmax:       0.99 m/s AV Area (Vmean):   1.00 cm     PV Vmean:      59.600 cm/s AV Area (VTI):     1.13 cm     PV VTI:        0.172 m AV Vmax:           207.00 cm/s  PV Peak grad:  3.9 mmHg AV Vmean:          156.500 cm/s PV Mean grad:  2.0 mmHg AV VTI:            0.329 m AV Peak Grad:      17.1 mmHg AV Mean Grad:      12.0 mmHg LVOT Vmax:         87.60 cm/s LVOT Vmean:        61.500 cm/s LVOT VTI:          0.146 m LVOT/AV VTI ratio: 0.44  AORTA Ao Asc diam: 2.50 cm MITRAL VALVE                 TRICUSPID VALVE MV Area (PHT): 6.27 cm      TR Peak grad:   33.4 mmHg MV Decel Time: 121 msec      TR Vmax:        289.00 cm/s MR Peak grad:    85.4 mmHg MR Mean grad:    54.0 mmHg   SHUNTS MR Vmax:         462.00 cm/s Systemic VTI:  0.15 m MR Vmean:        350.0 cm/s  Systemic Diam: 1.80 cm MR PISA:          3.08 cm MR PISA Eff ROA: 14 mm MR PISA Radius:  0.70 cm MV E velocity: 181.00 cm/s Oswaldo Milian MD Electronically signed by Oswaldo Milian MD Signature Date/Time: 01/16/2021/4:16:28 PM    Final         Scheduled Meds: . amiodarone  200 mg Oral Daily  . doxazosin  4 mg Oral Daily  . fluticasone furoate-vilanterol  1 puff Inhalation Daily   And  .  umeclidinium bromide  1 puff Inhalation Daily  . furosemide  40 mg Intravenous BID  . heparin  5,000 Units Subcutaneous Q8H  . insulin aspart  0-5 Units Subcutaneous QHS  . insulin aspart  0-9 Units Subcutaneous TID WC  . levothyroxine  88 mcg Oral Q0600  . pantoprazole  40 mg Oral BID  . rosuvastatin  10 mg Oral Daily  . sodium chloride flush  3 mL Intravenous Q12H   Continuous Infusions:        Aline August, MD [facility] 01/17/2021, 7:11 AM

## 2021-01-17 NOTE — Progress Notes (Addendum)
Progress Note  Patient Name: Patricia [name] Date of Encounter: 01/17/2021  [facility] Cardiologist: [name] Martinique, MD   Subjective   No acute overnight events. She feels like she is breathing a little better than she was yesterday; however, she did not have any significant urine output with high dose Lasix yesterday. No chest pain or palpitations.  Inpatient Medications    Scheduled Meds: . amiodarone  200 mg Oral Daily  . doxazosin  4 mg Oral Daily  . fluticasone furoate-vilanterol  1 puff Inhalation Daily   And  . umeclidinium bromide  1 puff Inhalation Daily  . heparin  5,000 Units Subcutaneous Q8H  . insulin aspart  0-5 Units Subcutaneous QHS  . insulin aspart  0-9 Units Subcutaneous TID WC  . levothyroxine  88 mcg Oral Q0600  . pantoprazole  40 mg Oral BID  . rosuvastatin  10 mg Oral Daily  . sodium chloride flush  3 mL Intravenous Q12H   Continuous Infusions:  PRN Meds: acetaminophen **OR** acetaminophen, albuterol, azelastine, dextromethorphan-guaiFENesin, methocarbamol, ondansetron **OR** ondansetron (ZOFRAN) IV   Vital Signs    Vitals:   01/16/21 1930 01/17/21 0000 01/17/21 0510 01/17/21 0605  BP: 101/67 103/69 (!) 108/55   Pulse: (!) 105 (!) 104 (!) 106   Resp: 20 20 (!) 21   Temp: 98.3 F (36.8 C) 97.8 F (36.6 C) 97.9 F (36.6 C)   TempSrc: Oral Oral Oral   SpO2: 98% 99% 99%   Weight:    98.8 kg  Height:        Intake/Output Summary (Last 24 hours) at 01/17/2021 0836 Last data filed at 01/16/2021 2229 Gross per 24 hour  Intake 477 ml  Output 575 ml  Net -98 ml   Last 3 Weights 01/17/2021 01/16/2021 01/16/2021  Weight (lbs) 217 lb 13 oz 226 lb 13.7 oz 226 lb 13.7 oz  Weight (kg) 98.8 kg 102.9 kg 102.9 kg      Telemetry    Atrial fibrillation with underlying LBBB. Rates in the 100's to 110's. - Personally Reviewed  ECG    No new ECG tracing today. - Personally Reviewed  Physical Exam   GEN: Morbidly obese Caucasian female. No acute  distress.   Neck: No JVD. Cardiac: Tachycardic with irregularly irregular rhythm. no murmurs, rubs, or gallops.  Respiratory: No significant increased work of breathing. Crackles in bilateral bases. Decreased breath sounds in right base. GI: Soft, non-distended, and non-tender. MS: 1+ pitting edema of bilateral lower extremities (right slightly > left). No deformity. Skin: Warm and dry. Neuro:  No focal deficits. Psych: Normal affect. Responds appropriately.  Labs    High Sensitivity Troponin:   Recent Labs  Lab 01/09/2021 1904 12/25/2020 2104  TROPONINIHS 35* 34*      Chemistry Recent Labs  Lab 01/04/2021 1904 01/14/2021 2104 01/16/21 0437 01/16/21 1656 01/17/21 0318  NA 137  --  137  --  137  K 5.7* 5.0 4.6  --  4.4  CL 97*  --  97*  --  96*  CO2 31  --  31  --  33*  GLUCOSE 223*  --  190*  --  143*  BUN 60*  --  63*  --  73*  CREATININE 2.18*  --  2.16*  --  2.81*  CALCIUM 9.4  --  9.2  --  8.9  PROT 7.1  --   --  6.4*  --   ALBUMIN 3.8  --   --  3.4*  --  AST 29  --   --  14*  --   ALT 18  --   --  13  --   ALKPHOS 55  --   --  50  --   BILITOT 0.7  --   --  0.6  --   GFRNONAA 22*  --  22*  --  16*  ANIONGAP 9  --  9  --  8     Hematology Recent Labs  Lab 01/20/2021 1904 01/16/21 0437 01/17/21 0318  WBC 5.8 6.0 5.6  RBC 3.59* 3.25* 3.18*  HGB 10.2* 9.4* 9.4*  HCT 34.2* 31.6* 29.9*  MCV 95.3 97.2 94.0  MCH 28.4 28.9 29.6  MCHC 29.8* 29.7* 31.4  RDW 16.1* 16.1* 16.3*  PLT 150 138* 133*    BNP Recent Labs  Lab 01/05/2021 1904  BNP 1,681.6*     DDimer No results for input(s): DDIMER in the last 168 hours.   Radiology    DG Chest Portable 1 View  Result Date: 01/01/2021 CLINICAL DATA:  Shortness of breath. EXAM: PORTABLE CHEST 1 VIEW COMPARISON:  Chest x-ray 12/17/2020, CT chest 05/31/2020 FINDINGS: Cardiomegaly. The heart size and mediastinal contours are unchanged. Aortic arch calcifications. Aortic valve replacement. Prominence of the hilar  vasculature. Biapical pleural/pulmonary scarring. Streaky airspace opacities at the right lower lobe. No pulmonary edema. Bilateral trace to small volume pleural effusions. No pneumothorax. No acute osseous abnormality. Partially visualized right shoulder arthroplasty. IMPRESSION: 1. Pulmonary edema with bilateral trace to small volume pleural effusion. Superimposed infection/inflammation not excluded. 2. Cardiomegaly. Electronically Signed   By: Iven Finn M.D.   On: 01/16/2021 20:48   ECHOCARDIOGRAM COMPLETE  Result Date: 01/16/2021    ECHOCARDIOGRAM REPORT   Patient Name:   Patricia [name] Date of Exam: 01/16/2021 Medical Rec #:  [id number]      Height:       63.0 in Accession #:    [id number]     Weight:       209.0 lb Date of Birth:  Mar 29, 1936     BSA:          1.970 m Patient Age:    31 years       BP:           209/63 mmHg Patient Gender: F              HR:           107 bpm. Exam Location:  Inpatient Procedure: 2D Echo Indications:    N62.95 Acute systolic (congestive) heart failure  History:        Patient has prior history of Echocardiogram examinations, most                 recent 11/07/2019. CHF.  Sonographer:    Luisa Hart RDCS Referring Phys: [id number] Shell  1. Left ventricular ejection fraction, by estimation, is 25 to 30%. The left ventricle has severely decreased function. The left ventricle demonstrates regional wall motion abnormalities) Abnormal (paradoxical) septal motion, consistent with left bundle  branch block. There is severe left ventricular hypertrophy. Left ventricular diastolic parameters are indeterminate.  2. Right ventricular systolic function is moderately reduced. The right ventricular size is normal. There is moderately elevated pulmonary artery systolic pressure. The estimated right ventricular systolic pressure is 40.1 mmHg.  3. Left atrial size was severely dilated.  4. Right atrial size was moderately dilated.  5. The mitral valve is abnormal. Mild  to moderate mitral valve  regurgitation. Severe mitral annular calcification.  6. There is a 23 mm Edwards Sapien prosthetic, stented (TAVR) valve present in the aortic position. Aortic valve regurgitation is not visualized. Aortic valve mean gradient measures 12.0 mmHg, which is reduced from prior echo 11/07/19, likely due to decreased systolic function. Vmax 2.1 m/s, MG 12 mmHg, EOA 1.1 cm^2, DI 0.44  7. The inferior vena cava is dilated in size with <50% respiratory variability, suggesting right atrial pressure of 15 mmHg. FINDINGS  Left Ventricle: Left ventricular ejection fraction, by estimation, is 25 to 30%. The left ventricle has severely decreased function. The left ventricle demonstrates regional wall motion abnormalities. 3D left ventricular ejection fraction analysis performed but not reported based on interpreter judgement due to suboptimal quality. The left ventricular internal cavity size was normal in size. There is severe left ventricular hypertrophy. Abnormal (paradoxical) septal motion, consistent with left bundle branch block. Left ventricular diastolic parameters are indeterminate. Right Ventricle: The right ventricular size is normal. Right vetricular wall thickness was not well visualized. Right ventricular systolic function is moderately reduced. There is moderately elevated pulmonary artery systolic pressure. The tricuspid regurgitant velocity is 2.89 m/s, and with an assumed right atrial pressure of 15 mmHg, the estimated right ventricular systolic pressure is 01.6 mmHg. Left Atrium: Left atrial size was severely dilated. Right Atrium: Right atrial size was moderately dilated. Pericardium: There is no evidence of pericardial effusion. Mitral Valve: The mitral valve is abnormal. Severe mitral annular calcification. Mild to moderate mitral valve regurgitation. Tricuspid Valve: The tricuspid valve is normal in structure. Tricuspid valve regurgitation is mild. Aortic Valve: The aortic valve has  been repaired/replaced. Aortic valve regurgitation is not visualized. Aortic valve mean gradient measures 12.0 mmHg. Aortic valve peak gradient measures 17.1 mmHg. Aortic valve area, by VTI measures 1.13 cm. There is a  23 mm Edwards Sapien prosthetic, stented (TAVR) valve present in the aortic position. Pulmonic Valve: The pulmonic valve was not well visualized. Pulmonic valve regurgitation is not visualized. Aorta: The aortic root and ascending aorta are structurally normal, with no evidence of dilitation. Venous: The inferior vena cava is dilated in size with less than 50% respiratory variability, suggesting right atrial pressure of 15 mmHg. IAS/Shunts: The interatrial septum was not well visualized.  LEFT VENTRICLE PLAX 2D LVIDd:         3.90 cm LVIDs:         3.40 cm LV PW:         1.80 cm LV IVS:        2.00 cm LVOT diam:     1.80 cm LV SV:         37 LV SV Index:   19 LVOT Area:     2.54 cm  LV Volumes (MOD) LV vol d, MOD A2C: 119.0 ml LV vol d, MOD A4C: 74.5 ml LV vol s, MOD A2C: 74.1 ml LV vol s, MOD A4C: 67.8 ml LV SV MOD A2C:     44.9 ml LV SV MOD A4C:     74.5 ml LV SV MOD BP:      27.3 ml RIGHT VENTRICLE TAPSE (M-mode): 0.5 cm LEFT ATRIUM              Index        RIGHT ATRIUM           Index LA Vol (A2C):   202.0 ml 102.53 ml/m RA Area:     26.20 cm LA Vol (A4C):   139.0 ml 70.55 ml/m  RA Volume:   87.20 ml  44.26 ml/m LA Biplane Vol: 180.0 ml 91.36 ml/m  AORTIC VALVE                    PULMONIC VALVE AV Area (Vmax):    1.08 cm     PV Vmax:       0.99 m/s AV Area (Vmean):   1.00 cm     PV Vmean:      59.600 cm/s AV Area (VTI):     1.13 cm     PV VTI:        0.172 m AV Vmax:           207.00 cm/s  PV Peak grad:  3.9 mmHg AV Vmean:          156.500 cm/s PV Mean grad:  2.0 mmHg AV VTI:            0.329 m AV Peak Grad:      17.1 mmHg AV Mean Grad:      12.0 mmHg LVOT Vmax:         87.60 cm/s LVOT Vmean:        61.500 cm/s LVOT VTI:          0.146 m LVOT/AV VTI ratio: 0.44  AORTA Ao Asc diam: 2.50  cm MITRAL VALVE                 TRICUSPID VALVE MV Area (PHT): 6.27 cm      TR Peak grad:   33.4 mmHg MV Decel Time: 121 msec      TR Vmax:        289.00 cm/s MR Peak grad:    85.4 mmHg MR Mean grad:    54.0 mmHg   SHUNTS MR Vmax:         462.00 cm/s Systemic VTI:  0.15 m MR Vmean:        350.0 cm/s  Systemic Diam: 1.80 cm MR PISA:         3.08 cm MR PISA Eff ROA: 14 mm MR PISA Radius:  0.70 cm MV E velocity: 181.00 cm/s Oswaldo Milian MD Electronically signed by Oswaldo Milian MD Signature Date/Time: 01/16/2021/4:16:28 PM    Final     Cardiac Studies   Right/Left Cardiac Catheterization 27/11/5364:  LV end diastolic pressure is moderately elevated.  There is severe aortic valve stenosis.  There is no mitral valve stenosis.  Prox LAD to Mid LAD lesion, 20 %stenosed.  Ost 3rd Mrg to 3rd Mrg lesion, 75 %stenosed.  Ost 2nd Mrg to 2nd Mrg lesion, 30 %stenosed.  Ost RCA to Dist RCA lesion, 10 %stenosed.  Hemodynamic findings consistent with mild pulmonary hypertension.   1. Single vessel obstructive CAD involving OM3 2. Moderate to severe aortic stenosis. Mean gradient 41 mm Hg, valve index 0.7 3. Mild pulmonary HTN 4. Elevated LV filling pressures. Prominent V waves on PCWP tracings c/w decreased LV compliance.  5. Normal cardiac output.   Plan: recommend consideration for AVR/ single vessel CABG.  _______________  Echocardiogram 01/16/2021: Impressions: 1. Left ventricular ejection fraction, by estimation, is 25 to 30%. The  left ventricle has severely decreased function. The left ventricle  demonstrates regional wall motion abnormalities) Abnormal (paradoxical)  septal motion, consistent with left bundle  branch block. There is severe left ventricular hypertrophy. Left  ventricular diastolic parameters are indeterminate.  2. Right ventricular systolic function is moderately reduced. The right  ventricular size is normal. There is moderately  elevated pulmonary  artery  systolic pressure. The estimated right ventricular systolic pressure is  59.1 mmHg.  3. Left atrial size was severely dilated.  4. Right atrial size was moderately dilated.  5. The mitral valve is abnormal. Mild to moderate mitral valve  regurgitation. Severe mitral annular calcification.  6. There is a 23 mm Edwards Sapien prosthetic, stented (TAVR) valve  present in the aortic position. Aortic valve regurgitation is not  visualized. Aortic valve mean gradient measures 12.0 mmHg, which is  reduced from prior echo 11/07/19, likely due to  decreased systolic function. Vmax 2.1 m/s, MG 12 mmHg, EOA 1.1 cm^2, DI  0.44  7. The inferior vena cava is dilated in size with <50% respiratory  variability, suggesting right atrial pressure of 15 mmHg.   Patient Profile     86 y.o. female with a history of single vessel CAD, aortic stenosis s/p TAVR in 2017, chronic combined CHF, paroxysmal atrial fibrillation with known LBBB not on anticoagulation due to recurrent GI bleed, COPD with chronic respiratory failure on 2 L of O2 at home, hypertension, hyperlipidemia, type 2 diabetes mellitus, CKD stage IV, anemia, and obesity who is being seen for evaluation of CHF at the request of dr. Posey Pronto.  Assessment & Plan    Acute on Chronic Combined CHF - BNP elevated at 1,681.  - Chest x-ray showed cardiomegaly with pulmonary edema and trace to small bilateral pleural effusions. - Echo showed LVEF of 25-30% with paradoxical septal motion consistent with LBBB and severe LVH. RV normal in sizes with moderately reduced systolic function and moderately elevated PASP of 48.4 mmHg. Also showed mild to moderate MR. - Received total of 158m of IV Lasix yesterday with only minimal urinary output and worsening renal function. Will hold off on additional diuresis for now. - No ACE/ARB/ARNI or MRA due to renal function. - Will add Lopressor 250mtwice daily today. If she tolerates this from a BP standpoint, can  transition to Toprol-XL prior to discharge. - Will hold off on Hydralazine/Imdur given soft BP. - Will stop Cardura to give [place] BP room to add GDMT. - Continue to monitor daily weights, strict I/O's, and renal function. - May benefit from right heart catheterization this admission to help assess whether this dyspnea is more from CHF or underlying pulmonary conditions. Also recommend V/Q scan given new RV dysfunction.  Paroxysmal Atrial Fibrillation - Longstanding history of paroxysmal s/p multiple DCCV in the past (last one in 07/2019). Previously on anticoagulation but did not tolerate this due to recurrent GI bleeds requiring transfusions. - Continue home Amiodarone. - Will start low dose beta-blocker as above. - Difficult situation. Uncontrolled atrial fibrillation may be cause of decrease in EF. Unfortunately options are limited. Rate control limited by soft BP. Patient can not be on long-term anticoagulation given history of recurrent GI bleeds. Of note, she was able to tolerate 1 month of anticoagulation following last DCCV in 2020. So this may be an option again. Will consult EP.  [facility] single vessel obstructive CAD involving OM3 and mild non-obstructive disease elsewhere. Treated medically. - No angina symptoms. - Not on aspirin at home (assuming due to history of GI bleeds). - Continue statin.  Aortic Stenosis s/p TAVR in 2017 - Echo this admission showed aortic valve mean gradient of 12.0 mmHg which is reduced form prior Echo in 10/2019 (felt to be due to decreased systolic function).  Hyperlipidemia - LDL 61 in 10/2019. - Continue home  Crestor.  Acute on Chronic CKD Stage IV - Creatinine 2.18 on admission and up to 2.81 today following diuresis. Recent baseline around 1.7 to 2.0. - See recommendations for diuresis above. - Continue to avoid Nephrotoxic agents. - If renal function continues to decline, may need to consult Nephrology.  Otherwise,  per primary team: - COPD with chronic respiratory failure on home O2 - Obesity hypoventilation syndrome - Type 2 diabetes mellitus - Chronic anemia - Thrombocytopenia - Generalized deconditioning  For questions or updates, please contact [facility] Please consult [email or web address] for contact info under        Signed, Darreld Mclean, PA-C  01/17/2021, 8:36 AM    Patient seen and examined with Sande Rives, PA-C.  Agree as above, with the following exceptions and changes as noted below. Gen: NAD, CV: RRR, no murmurs, Lungs: Diminished bilaterally, Abd: soft, Extrem: Warm, well perfused, 1+ edema, Neuro/Psych: alert and oriented x 3, normal mood and affect. All available labs, radiology testing, previous records reviewed.  Patient had no response to Lasix challenge yesterday appropriately dosed for her renal function.  She had worsening of renal function.  On exam she is warm and slightly wet.  Atrial fibrillation is likely playing a large role in her current presentation.  Several challenging factors in the management of atrial fibrillation include hypotension limiting rate control, and inability to anticoagulate due to recurrent GI bleeds with profound anemia.  She may eventually be a candidate for watchman.  For assistance with management of her atrial fibrillation which I suspect is contributing to tachycardia mediated cardiomyopathy, we will request EP consultation today.  I have reviewed her echocardiogram side-by-side.  There is a mild decrease in LV function and visual estimation is challenging in the setting of significant dyssynchrony from LBBB.  I do not think there has been a dramatic change in her ejection fraction.  Recommend pulmonary consultation.  She appears to be chronically retaining CO2, and some of her heart failure presentation with shortness of breath may be secondary to primary pulmonary process.  Pulmonary medicine has seen the patient and has recommended continued  diuresis.  It is unclear if the patient was urinating and not being tracked with I's and O's, I asked the patient and she is not sure if this is being tracked strictly she does not remember.  We will see how things go after repeat diuresis, volume exam is challenging.  Would consider right heart catheterization this admission for better understanding of pulmonary pressure.  Patient will think about this.  This would be extremely helpful in understanding her volume status since exam is challenging.  Elouise Munroe, MD 01/17/21 4:40 PM

## 2021-01-18 ENCOUNTER — Inpatient Hospital Stay (HOSPITAL_COMMUNITY): Payer: Medicare Other

## 2021-01-18 ENCOUNTER — Encounter (HOSPITAL_COMMUNITY): Admission: EM | Disposition: E | Payer: Self-pay | Source: Home / Self Care | Attending: Internal Medicine

## 2021-01-18 DIAGNOSIS — I5043 Acute on chronic combined systolic (congestive) and diastolic (congestive) heart failure: Secondary | ICD-10-CM | POA: Diagnosis not present

## 2021-01-18 DIAGNOSIS — I5031 Acute diastolic (congestive) heart failure: Secondary | ICD-10-CM

## 2021-01-18 DIAGNOSIS — I5030 Unspecified diastolic (congestive) heart failure: Secondary | ICD-10-CM | POA: Diagnosis not present

## 2021-01-18 DIAGNOSIS — N1832 Chronic kidney disease, stage 3b: Secondary | ICD-10-CM | POA: Diagnosis not present

## 2021-01-18 DIAGNOSIS — I4819 Other persistent atrial fibrillation: Secondary | ICD-10-CM | POA: Diagnosis not present

## 2021-01-18 DIAGNOSIS — Z515 Encounter for palliative care: Secondary | ICD-10-CM

## 2021-01-18 DIAGNOSIS — I5023 Acute on chronic systolic (congestive) heart failure: Secondary | ICD-10-CM | POA: Diagnosis not present

## 2021-01-18 DIAGNOSIS — I2510 Atherosclerotic heart disease of native coronary artery without angina pectoris: Secondary | ICD-10-CM | POA: Diagnosis not present

## 2021-01-18 DIAGNOSIS — I480 Paroxysmal atrial fibrillation: Secondary | ICD-10-CM | POA: Diagnosis not present

## 2021-01-18 DIAGNOSIS — J449 Chronic obstructive pulmonary disease, unspecified: Secondary | ICD-10-CM | POA: Diagnosis not present

## 2021-01-18 DIAGNOSIS — E662 Morbid (severe) obesity with alveolar hypoventilation: Secondary | ICD-10-CM | POA: Diagnosis not present

## 2021-01-18 DIAGNOSIS — J9621 Acute and chronic respiratory failure with hypoxia: Secondary | ICD-10-CM | POA: Diagnosis not present

## 2021-01-18 DIAGNOSIS — Z7189 Other specified counseling: Secondary | ICD-10-CM

## 2021-01-18 DIAGNOSIS — N179 Acute kidney failure, unspecified: Secondary | ICD-10-CM | POA: Diagnosis not present

## 2021-01-18 HISTORY — PX: RIGHT HEART CATH: CATH118263

## 2021-01-18 LAB — GLUCOSE, CAPILLARY
Glucose-Capillary: 137 mg/dL — ABNORMAL HIGH (ref 70–99)
Glucose-Capillary: 176 mg/dL — ABNORMAL HIGH (ref 70–99)
Glucose-Capillary: 114 mg/dL — ABNORMAL HIGH (ref 70–99)
Glucose-Capillary: 196 mg/dL — ABNORMAL HIGH (ref 70–99)

## 2021-01-18 LAB — MAGNESIUM: Magnesium: 2 mg/dL (ref 1.7–2.4)

## 2021-01-18 LAB — BASIC METABOLIC PANEL
Anion gap: 11 (ref 5–15)
BUN: 81 mg/dL — ABNORMAL HIGH (ref 8–23)
CO2: 32 mmol/L (ref 22–32)
Calcium: 8.5 mg/dL — ABNORMAL LOW (ref 8.9–10.3)
Chloride: 93 mmol/L — ABNORMAL LOW (ref 98–111)
Creatinine, Ser: 3 mg/dL — ABNORMAL HIGH (ref 0.44–1.00)
GFR, Estimated: 15 mL/min — ABNORMAL LOW (ref >60)
Glucose, Bld: 157 mg/dL — ABNORMAL HIGH (ref 70–99)
Potassium: 4 mmol/L (ref 3.5–5.1)
Sodium: 136 mmol/L (ref 135–145)

## 2021-01-18 LAB — POCT I-STAT EG7
Acid-Base Excess: 7 mmol/L — ABNORMAL HIGH (ref 0.0–2.0)
Acid-Base Excess: 7 mmol/L — ABNORMAL HIGH (ref 0.0–2.0)
Bicarbonate: 34.5 mmol/L — ABNORMAL HIGH (ref 20.0–28.0)
Bicarbonate: 35.3 mmol/L — ABNORMAL HIGH (ref 20.0–28.0)
Calcium, Ion: 1.07 mmol/L — ABNORMAL LOW (ref 1.15–1.40)
Calcium, Ion: 1.09 mmol/L — ABNORMAL LOW (ref 1.15–1.40)
HCT: 29 % — ABNORMAL LOW (ref 36.0–46.0)
HCT: 30 % — ABNORMAL LOW (ref 36.0–46.0)
Hemoglobin: 10.2 g/dL — ABNORMAL LOW (ref 12.0–15.0)
Hemoglobin: 9.9 g/dL — ABNORMAL LOW (ref 12.0–15.0)
O2 Saturation: 49 %
O2 Saturation: 50 %
Potassium: 3.8 mmol/L (ref 3.5–5.1)
Potassium: 3.9 mmol/L (ref 3.5–5.1)
Sodium: 139 mmol/L (ref 135–145)
Sodium: 139 mmol/L (ref 135–145)
TCO2: 37 mmol/L — ABNORMAL HIGH (ref 22–32)
TCO2: 37 mmol/L — ABNORMAL HIGH (ref 22–32)
pCO2, Ven: 69.9 mmHg — ABNORMAL HIGH (ref 44.0–60.0)
pCO2, Ven: 70.5 mmHg (ref 44.0–60.0)
pH, Ven: 7.301 (ref 7.250–7.430)
pH, Ven: 7.308 (ref 7.250–7.430)
pO2, Ven: 30 mmHg — CL (ref 32.0–45.0)
pO2, Ven: 31 mmHg — CL (ref 32.0–45.0)

## 2021-01-18 SURGERY — RIGHT HEART CATH

## 2021-01-18 MED ORDER — MILRINONE LACTATE IN DEXTROSE 20-5 MG/100ML-% IV SOLN
0.1250 ug/kg/min | INTRAVENOUS | Status: DC
  Administered 2021-01-18 – 2021-01-20 (×5): 0.25 ug/kg/min via INTRAVENOUS
  Administered 2021-01-21: 0.125 ug/kg/min via INTRAVENOUS
  Filled 2021-01-18 (×6): qty 100

## 2021-01-18 MED ORDER — FUROSEMIDE 10 MG/ML IJ SOLN
80.0000 mg | Freq: Once | INTRAMUSCULAR | Status: AC
  Administered 2021-01-18: 80 mg via INTRAVENOUS
  Filled 2021-01-18: qty 8

## 2021-01-18 MED ORDER — SODIUM CHLORIDE 0.9 % IV SOLN: INTRAVENOUS | Status: DC

## 2021-01-18 MED ORDER — SODIUM CHLORIDE 0.9% FLUSH
3.0000 mL | Freq: Two times a day (BID) | INTRAVENOUS | Status: DC
  Administered 2021-01-18: 3 mL via INTRAVENOUS

## 2021-01-18 MED ORDER — AMIODARONE HCL IN DEXTROSE 360-4.14 MG/200ML-% IV SOLN
60.0000 mg/h | INTRAVENOUS | Status: AC
  Administered 2021-01-18: 60 mg/h via INTRAVENOUS
  Filled 2021-01-18: qty 200

## 2021-01-18 MED ORDER — HEPARIN (PORCINE) IN NACL 1000-0.9 UT/500ML-% IV SOLN
INTRAVENOUS | Status: DC | PRN
  Administered 2021-01-18: 500 mL

## 2021-01-18 MED ORDER — APIXABAN 2.5 MG PO TABS
2.5000 mg | ORAL_TABLET | Freq: Two times a day (BID) | ORAL | Status: DC
  Administered 2021-01-18 – 2021-01-19 (×3): 2.5 mg via ORAL
  Filled 2021-01-18 (×3): qty 1

## 2021-01-18 MED ORDER — MILRINONE LACTATE IN DEXTROSE 20-5 MG/100ML-% IV SOLN: 0.2500 ug/kg/min | INTRAVENOUS | Status: DC

## 2021-01-18 MED ORDER — LIDOCAINE HCL (PF) 1 % IJ SOLN
INTRAMUSCULAR | Status: AC
  Filled 2021-01-18: qty 30

## 2021-01-18 MED ORDER — HEPARIN (PORCINE) IN NACL 1000-0.9 UT/500ML-% IV SOLN
INTRAVENOUS | Status: AC
  Filled 2021-01-18: qty 500

## 2021-01-18 MED ORDER — FUROSEMIDE 10 MG/ML IJ SOLN
20.0000 mg/h | INTRAVENOUS | Status: DC
  Administered 2021-01-18 – 2021-01-20 (×5): 20 mg/h via INTRAVENOUS
  Filled 2021-01-18 (×8): qty 20

## 2021-01-18 MED ORDER — SODIUM CHLORIDE 0.9% FLUSH: 3.0000 mL | INTRAVENOUS | Status: DC | PRN

## 2021-01-18 MED ORDER — SODIUM CHLORIDE 0.9 % IV SOLN: 250.0000 mL | INTRAVENOUS | Status: DC | PRN

## 2021-01-18 MED ORDER — AMIODARONE HCL IN DEXTROSE 360-4.14 MG/200ML-% IV SOLN
30.0000 mg/h | INTRAVENOUS | Status: DC
  Administered 2021-01-19: 30 mg/h via INTRAVENOUS
  Administered 2021-01-19 – 2021-01-23 (×12): 60 mg/h via INTRAVENOUS
  Administered 2021-01-23: 30 mg/h via INTRAVENOUS
  Administered 2021-01-23: 60 mg/h via INTRAVENOUS
  Administered 2021-01-24: 30 mg/h via INTRAVENOUS
  Filled 2021-01-18 (×17): qty 200

## 2021-01-18 MED ORDER — LIDOCAINE HCL (PF) 1 % IJ SOLN
INTRAMUSCULAR | Status: DC | PRN
  Administered 2021-01-18: 2 mL

## 2021-01-18 SURGICAL SUPPLY — 7 items
CATH EXPO 5F MPA-1 (CATHETERS) ×1 IMPLANT
CATH INFINITI MULTIPACK ANG 4F (CATHETERS) ×1 IMPLANT
CATH SWAN GANZ 7F STRAIGHT (CATHETERS) ×1 IMPLANT
GLIDESHEATH SLENDER 7FR .021G (SHEATH) ×1 IMPLANT
GUIDEWIRE .025 260CM (WIRE) ×1 IMPLANT
TUBING ART PRESS 72  MALE/MALE (TUBING) ×1 IMPLANT
WIRE EMERALD 3MM-J .035X260CM (WIRE) ×1 IMPLANT

## 2021-01-18 NOTE — Plan of Care (Signed)
  Problem: Clinical Measurements: Goal: Respiratory complications will improve Outcome: Progressing

## 2021-01-18 NOTE — Progress Notes (Signed)
PT Cancellation Note  Patient Details Name: Patricia [name] MRN: [id number] DOB: [birth year]   Cancelled Treatment:    Reason Eval/Treat Not Completed: Patient at procedure or test/unavailable.  Just transported to the CATH Lab 5 minutes before arrival.  Likely will not be able to see her before the end of the day especially if there is a period of bedrest post procedure, but will check on pt if time does allow. 01/09/2021  Ginger Carne., PT Acute Rehabilitation Services [phone]  (pager) [phone]  (office)   Patricia [name] 01/17/2021, 2:50 PM

## 2021-01-18 NOTE — Progress Notes (Signed)
Patient ID: Patricia [name], female   DOB: [birth year], 85 y.o.   MRN: [id number]  PROGRESS NOTE    Patricia [name]  WUJ:[id number] DOB: 01-04-1936 DOA: 01/08/2021 PCP: [name], NP   Brief Narrative:  85 y.o. female with medical history significant for chronic systolic CHF (EF improved from 25-30% to 40-45% on last TTE 11/07/2019), chronic respiratory failure with hypoxia on 2 L supplemental O2 via [place] at all times, paroxysmal atrial fibrillation on amiodarone, COPD, aortic stenosis s/p TAVR, CKD stage IV, T2DM, HTN, hypothyroidism, HLD, LBBB, and OHS not on BiPAP or CPAP presented with worsening shortness of breath.  On presentation, creatinine was 2.18, BNP of 1681.6 with high-sensitivity troponin of 35.  COVID-19 test was negative.  Chest x-ray showed pulmonary edema and bilateral pleural effusions with cardiomegaly.  She was given IV Lasix.  Assessment & Plan:   Acute on chronic systolic CHF -Presented with orthopnea, DOE, peripheral edema.  Last EF 40-45% on 11/07/2019.  BNP >1600.  Troponin minimally elevated likely due to demand ischemia. -Strict input and output.  Daily weights.  Fluid restriction.   -Echo shows EF of 25 to 30% with mild to moderate mitral regurgitation. -Cardiology following: Lasix held on 01/17/2021 because of increasing creatinine.  Pulmonary restarted higher dose of IV Lasix yesterday afternoon.  Acute on chronic respiratory failure with hypoxia -Wears 2 L oxygen via nasal cannula at the time at home.  Required 4 L on presentation in the ED.  Currently still on 3 L oxygen via nasal cannula. -VQ scan pending -Pulmonary evaluation appreciated  Acute kidney injury on chronic kidney disease stage IV -Baseline creatinine around 1.82.  Probably from cardiorenal syndrome.  Creatinine worsening to 3 today.  Monitor creatinine.  If renal function does not improve, might need nephrology input as well.  COPD -Currently stable.  Continue home inhalers  Paroxysmal A.  fib -Currently still tachycardic.  Continue amiodarone.  Not on anticoagulation due to history of GI bleed -EP evaluation appreciated: EP may consider CRT-P if patient's condition improved significantly and patient is able to lie flat for extended period of time during this hospitalization.  History of aortic stenosis status post TAVR -Cardiology following.  Diabetes mellitus type II -Home Januvia and Amaryl on hold for now.  Continue CBGs with SSI.  A1c 7.1  Hypertension -Blood pressure on the lower side.  Continue Lasix and doxazosin  Hyperlipidemia Continue statin  Anemia of chronic disease -From renal failure.  Hemoglobin stable.  Monitor  Thrombocytopenia -Questionable cause.  Monitor  Obesity hypoventilation syndrome -Outpatient sleep study was recommended, not completed and not currently on CPAP or BiPAP as an outpatient.  Generalized deconditioning -PT eval -Overall prognosis is guarded to poor: Currently remains full code.  Palliative care evaluation is pending.  DVT prophylaxis: Subcutaneous heparin Code Status: Full Family Communication: None at bedside Disposition Plan: Status is: Inpatient because: Inpatient level of care appropriate due to severity of illness  Dispo: The patient is from: Home              Anticipated d/c is to: Home              Patient currently is not medically stable to d/c.   Difficult to place patient No   Consultants: Cardiology/EP/pulmonary/palliative care  Procedures: Echo   Antimicrobials: None   Subjective: Patient seen and examined at bedside.  Still having difficulty in catching her breath but slightly better compared to yesterday..  No overnight fever, vomiting reported.  She is not making that much urine. Objective: Vitals:   01/17/21 1323 01/17/21 1952 01/08/2021 0030 12/30/2020 0437  BP: (!) 87/56 (!) 103/39 123/66 (!) 99/52  Pulse: (!) 110 (!) 114 (!) 105 (!) 113  Resp: _0 Temp: 98 F (36.7 C) 97.6 F (36.4  C) 97.8 F (36.6 C) 97.7 F (36.5 C)  TempSrc: Oral Oral Oral Oral  SpO2: 99% 98% 98% 99%  Weight:   97.9 kg   Height:        Intake/Output Summary (Last 24 hours) at 01/05/2021 0749 Last data filed at 12/29/2020 0440 Gross per 24 hour  Intake 490 ml  Output 500 ml  Net -10 ml   Filed Weights   01/16/21 1506 01/17/21 0605 12/29/2020 0030  Weight: 102.9 kg 98.8 kg 97.9 kg    Examination:  General exam: Currently on 3 L oxygen via nasal cannula.  No distress.  Elderly female/chronically ill looking lying in bed. Respiratory system: Bilateral decreased breath sounds at bases with bibasilar crackles, no wheezing cardiovascular system: S1-S2 heard, tachycardic Gastrointestinal system: Abdomen is distended slightly, soft and nontender.  Normal bowel sounds heard  extremities: No cyanosis; lower extremity edema present bilaterally Central nervous system: Alert and oriented.  No focal neurological deficits.  Moving extremities  skin: No obvious petechiae/rashes Psychiatry: Flat affect    Data Reviewed: I have personally reviewed following labs and imaging studies  CBC: Recent Labs  Lab 01/05/2021 1904 01/16/21 0437 01/17/21 0318  WBC 5.8 6.0 5.6  NEUTROABS 5.3  --  4.2  HGB 10.2* 9.4* 9.4*  HCT 34.2* 31.6* 29.9*  MCV 95.3 97.2 94.0  PLT 150 138* 546*   Basic Metabolic Panel: Recent Labs  Lab 01/14/2021 1904 01/14/2021 2104 01/16/21 0437 01/17/21 0318 01/06/2021 0403  NA 137  --  137 137 136  K 5.7* 5.0 4.6 4.4 4.0  CL 97*  --  97* 96* 93*  CO2 31  --  31 33* 32  GLUCOSE 223*  --  190* 143* 157*  BUN 60*  --  63* 73* 81*  CREATININE 2.18*  --  2.16* 2.81* 3.00*  CALCIUM 9.4  --  9.2 8.9 8.5*  MG  --   --  1.9 1.9 2.0   GFR: Estimated Creatinine Clearance: 15.8 mL/min (A) (by C-G formula based on SCr of 3 mg/dL (H)). Liver Function Tests: Recent Labs  Lab 01/14/2021 1904 01/16/21 1656  AST 29 14*  ALT 18 13  ALKPHOS 55 50  BILITOT 0.7 0.6  PROT 7.1 6.4*   ALBUMIN 3.8 3.4*   No results for input(s): LIPASE, AMYLASE in the last 168 hours. No results for input(s): AMMONIA in the last 168 hours. Coagulation Profile: No results for input(s): INR, PROTIME in the last 168 hours. Cardiac Enzymes: No results for input(s): CKTOTAL, CKMB, CKMBINDEX, TROPONINI in the last 168 hours. BNP (last 3 results) No results for input(s): PROBNP in the last 8760 hours. HbA1C: Recent Labs    01/16/21 0437  HGBA1C 7.1*   CBG: Recent Labs  Lab 01/16/21 2137 01/17/21 0536 01/17/21 1142 01/17/21 1644 01/23/2021 0740  GLUCAP 151* 158* 126* 143* 137*   Lipid Profile: No results for input(s): CHOL, HDL, LDLCALC, TRIG, CHOLHDL, LDLDIRECT in the last 72 hours. Thyroid Function Tests: Recent Labs    01/16/21 1656  TSH 9.138*   Anemia Panel: No results for input(s): VITAMINB12, FOLATE, FERRITIN, TIBC, IRON, RETICCTPCT in the last 72 hours. Sepsis Labs: No results for input(s):  PROCALCITON, LATICACIDVEN in the last 168 hours.  Recent Results (from the past 240 hour(s))  Resp Panel by RT-PCR (Flu A&B, Covid) Nasopharyngeal Swab     Status: None   Collection Time: 12/29/2020  9:04 PM   Specimen: Nasopharyngeal Swab; Nasopharyngeal(NP) swabs in vial transport medium  Result Value Ref Range Status   SARS Coronavirus 2 by RT PCR NEGATIVE NEGATIVE Final    Comment: (NOTE) SARS-CoV-2 target nucleic acids are NOT DETECTED.  The SARS-CoV-2 RNA is generally detectable in upper respiratory specimens during the acute phase of infection. The lowest concentration of SARS-CoV-2 viral copies this assay can detect is 138 copies/mL. A negative result does not preclude SARS-Cov-2 infection and should not be used as the sole basis for treatment or other patient management decisions. A negative result may occur with  improper specimen collection/handling, submission of specimen other than nasopharyngeal swab, presence of viral mutation(s) within the areas targeted by  this assay, and inadequate number of viral copies(<138 copies/mL). A negative result must be combined with clinical observations, patient history, and epidemiological information. The expected result is Negative.  Fact Sheet for Patients:  EntrepreneurPulse.com.au  Fact Sheet for Healthcare Providers:  IncredibleEmployment.be  This test is no t yet approved or cleared by the [place] FDA and  has been authorized for detection and/or diagnosis of SARS-CoV-2 by FDA under an Emergency Use Authorization (EUA). This EUA will remain  in effect (meaning this test can be used) for the duration of the COVID-19 declaration under Section 564(b)(1) of the Act, 21 U.S.C.section 360bbb-3(b)(1), unless the authorization is terminated  or revoked sooner.       Influenza A by PCR NEGATIVE NEGATIVE Final   Influenza B by PCR NEGATIVE NEGATIVE Final    Comment: (NOTE) The Xpert Xpress SARS-CoV-2/FLU/RSV plus assay is intended as an aid in the diagnosis of influenza from Nasopharyngeal swab specimens and should not be used as a sole basis for treatment. Nasal washings and aspirates are unacceptable for Xpert Xpress SARS-CoV-2/FLU/RSV testing.  Fact Sheet for Patients: EntrepreneurPulse.com.au  Fact Sheet for Healthcare Providers: IncredibleEmployment.be  This test is not yet approved or cleared by the [place] FDA and has been authorized for detection and/or diagnosis of SARS-CoV-2 by FDA under an Emergency Use Authorization (EUA). This EUA will remain in effect (meaning this test can be used) for the duration of the COVID-19 declaration under Section 564(b)(1) of the Act, 21 U.S.C. section 360bbb-3(b)(1), unless the authorization is terminated or revoked.  Performed at [facility], [place] [address], [place], [place] [address]          Radiology Studies: DG CHEST PORT 1 VIEW  Result Date:  01/17/2021 CLINICAL DATA:  Shortness of breath EXAM: PORTABLE CHEST 1 VIEW COMPARISON:  01/08/2021 FINDINGS: Stable cardiomegaly. Atherosclerotic calcification of the aortic knob. Small bilateral pleural effusions, right greater than left. Associated bibasilar opacities. Diffuse interstitial prominence. No pneumothorax. IMPRESSION: Cardiomegaly with small bilateral pleural effusions and mild interstitial edema, suggesting CHF with mild pulmonary edema. No appreciable interval change from prior. Electronically Signed   By: Davina Poke D.O.   On: 01/17/2021 13:14   ECHOCARDIOGRAM COMPLETE  Result Date: 01/16/2021    ECHOCARDIOGRAM REPORT   Patient Name:   Patricia [name] Date of Exam: 01/16/2021 Medical Rec #:  [id number]      Height:       63.0 in Accession #:    [id number]     Weight:       209.0  lb Date of Birth:  [birth year]     BSA:          1.970 m Patient Age:    78 years       BP:           209/63 mmHg Patient Gender: F              HR:           107 bpm. Exam Location:  Inpatient Procedure: 2D Echo Indications:    Z66.06 Acute systolic (congestive) heart failure  History:        Patient has prior history of Echocardiogram examinations, most                 recent 11/07/2019. CHF.  Sonographer:    Luisa Hart RDCS Referring Phys: [id number] [place]  1. Left ventricular ejection fraction, by estimation, is 25 to 30%. The left ventricle has severely decreased function. The left ventricle demonstrates regional wall motion abnormalities) Abnormal (paradoxical) septal motion, consistent with left bundle  branch block. There is severe left ventricular hypertrophy. Left ventricular diastolic parameters are indeterminate.  2. Right ventricular systolic function is moderately reduced. The right ventricular size is normal. There is moderately elevated pulmonary artery systolic pressure. The estimated right ventricular systolic pressure is 93.2 mmHg.  3. Left atrial size was severely dilated.  4.  Right atrial size was moderately dilated.  5. The mitral valve is abnormal. Mild to moderate mitral valve regurgitation. Severe mitral annular calcification.  6. There is a 23 mm Edwards Sapien prosthetic, stented (TAVR) valve present in the aortic position. Aortic valve regurgitation is not visualized. Aortic valve mean gradient measures 12.0 mmHg, which is reduced from prior echo 11/07/19, likely due to decreased systolic function. Vmax 2.1 m/s, MG 12 mmHg, EOA 1.1 cm^2, DI 0.44  7. The inferior vena cava is dilated in size with <50% respiratory variability, suggesting right atrial pressure of 15 mmHg. FINDINGS  Left Ventricle: Left ventricular ejection fraction, by estimation, is 25 to 30%. The left ventricle has severely decreased function. The left ventricle demonstrates regional wall motion abnormalities. 3D left ventricular ejection fraction analysis performed but not reported based on interpreter judgement due to suboptimal quality. The left ventricular internal cavity size was normal in size. There is severe left ventricular hypertrophy. Abnormal (paradoxical) septal motion, consistent with left bundle branch block. Left ventricular diastolic parameters are indeterminate. Right Ventricle: The right ventricular size is normal. Right vetricular wall thickness was not well visualized. Right ventricular systolic function is moderately reduced. There is moderately elevated pulmonary artery systolic pressure. The tricuspid regurgitant velocity is 2.89 m/s, and with an assumed right atrial pressure of 15 mmHg, the estimated right ventricular systolic pressure is 35.5 mmHg. Left Atrium: Left atrial size was severely dilated. Right Atrium: Right atrial size was moderately dilated. Pericardium: There is no evidence of pericardial effusion. Mitral Valve: The mitral valve is abnormal. Severe mitral annular calcification. Mild to moderate mitral valve regurgitation. Tricuspid Valve: The tricuspid valve is normal in  structure. Tricuspid valve regurgitation is mild. Aortic Valve: The aortic valve has been repaired/replaced. Aortic valve regurgitation is not visualized. Aortic valve mean gradient measures 12.0 mmHg. Aortic valve peak gradient measures 17.1 mmHg. Aortic valve area, by VTI measures 1.13 cm. There is a  23 mm Edwards Sapien prosthetic, stented (TAVR) valve present in the aortic position. Pulmonic Valve: The pulmonic valve was not well visualized. Pulmonic valve regurgitation is not visualized. Aorta: The aortic  root and ascending aorta are structurally normal, with no evidence of dilitation. Venous: The inferior vena cava is dilated in size with less than 50% respiratory variability, suggesting right atrial pressure of 15 mmHg. IAS/Shunts: The interatrial septum was not well visualized.  LEFT VENTRICLE PLAX 2D LVIDd:         3.90 cm LVIDs:         3.40 cm LV PW:         1.80 cm LV IVS:        2.00 cm LVOT diam:     1.80 cm LV SV:         37 LV SV Index:   19 LVOT Area:     2.54 cm  LV Volumes (MOD) LV vol d, MOD A2C: 119.0 ml LV vol d, MOD A4C: 74.5 ml LV vol s, MOD A2C: 74.1 ml LV vol s, MOD A4C: 67.8 ml LV SV MOD A2C:     44.9 ml LV SV MOD A4C:     74.5 ml LV SV MOD BP:      27.3 ml RIGHT VENTRICLE TAPSE (M-mode): 0.5 cm LEFT ATRIUM              Index        RIGHT ATRIUM           Index LA Vol (A2C):   202.0 ml 102.53 ml/m RA Area:     26.20 cm LA Vol (A4C):   139.0 ml 70.55 ml/m  RA Volume:   87.20 ml  44.26 ml/m LA Biplane Vol: 180.0 ml 91.36 ml/m  AORTIC VALVE                    PULMONIC VALVE AV Area (Vmax):    1.08 cm     PV Vmax:       0.99 m/s AV Area (Vmean):   1.00 cm     PV Vmean:      59.600 cm/s AV Area (VTI):     1.13 cm     PV VTI:        0.172 m AV Vmax:           207.00 cm/s  PV Peak grad:  3.9 mmHg AV Vmean:          156.500 cm/s PV Mean grad:  2.0 mmHg AV VTI:            0.329 m AV Peak Grad:      17.1 mmHg AV Mean Grad:      12.0 mmHg LVOT Vmax:         87.60 cm/s LVOT Vmean:         61.500 cm/s LVOT VTI:          0.146 m LVOT/AV VTI ratio: 0.44  AORTA Ao Asc diam: 2.50 cm MITRAL VALVE                 TRICUSPID VALVE MV Area (PHT): 6.27 cm      TR Peak grad:   33.4 mmHg MV Decel Time: 121 msec      TR Vmax:        289.00 cm/s MR Peak grad:    85.4 mmHg MR Mean grad:    54.0 mmHg   SHUNTS MR Vmax:         462.00 cm/s Systemic VTI:  0.15 m MR Vmean:        350.0 cm/s  Systemic Diam: 1.80 cm MR PISA:  3.08 cm MR PISA Eff ROA: 14 mm MR PISA Radius:  0.70 cm MV E velocity: 181.00 cm/s Oswaldo Milian MD Electronically signed by Oswaldo Milian MD Signature Date/Time: 01/16/2021/4:16:28 PM    Final         Scheduled Meds: . amiodarone  200 mg Oral Daily  . doxazosin  4 mg Oral Daily  . fluticasone furoate-vilanterol  1 puff Inhalation Daily   And  . umeclidinium bromide  1 puff Inhalation Daily  . heparin  5,000 Units Subcutaneous Q8H  . insulin aspart  0-5 Units Subcutaneous QHS  . insulin aspart  0-9 Units Subcutaneous TID WC  . levothyroxine  88 mcg Oral Q0600  . pantoprazole  40 mg Oral BID  . QUEtiapine  12.5 mg Oral QHS  . rosuvastatin  10 mg Oral Daily  . sodium chloride flush  3 mL Intravenous Q12H   Continuous Infusions:        Aline August, MD [facility] 01/12/2021, 7:49 AM

## 2021-01-18 NOTE — H&P (View-Only) (Signed)
Progress Note  Patient Name: Patricia [name] Date of Encounter: 12/28/2020  [facility] Cardiologist: [name] Martinique, MD   Subjective   No acute overnight events. Patient actually feels like she is breathing better than yesterday but she had minimal urine output with high dose Lasix. No chest pain or palpitations.  Inpatient Medications    Scheduled Meds: . amiodarone  200 mg Oral Daily  . fluticasone furoate-vilanterol  1 puff Inhalation Daily   And  . umeclidinium bromide  1 puff Inhalation Daily  . heparin  5,000 Units Subcutaneous Q8H  . insulin aspart  0-5 Units Subcutaneous QHS  . insulin aspart  0-9 Units Subcutaneous TID WC  . levothyroxine  88 mcg Oral Q0600  . pantoprazole  40 mg Oral BID  . QUEtiapine  12.5 mg Oral QHS  . rosuvastatin  10 mg Oral Daily  . sodium chloride flush  3 mL Intravenous Q12H   Continuous Infusions: . sodium chloride     PRN Meds: acetaminophen **OR** acetaminophen, albuterol, azelastine, dextromethorphan-guaiFENesin, methocarbamol, Muscle Rub, ondansetron **OR** ondansetron (ZOFRAN) IV   Vital Signs    Vitals:   01/14/2021 0437 01/22/2021 0739 12/28/2020 1038 01/05/2021 1241  BP: (!) 99/52 106/69    Pulse: (!) 113 (!) 110    Resp: 20 19    Temp: 97.7 F (36.5 C) 97.8 F (36.6 C)    TempSrc: Oral Oral    SpO2: 99% 100% 97% 98%  Weight:      Height:        Intake/Output Summary (Last 24 hours) at 01/05/2021 1252 Last data filed at 01/17/2021 0440 Gross per 24 hour  Intake 490 ml  Output 500 ml  Net -10 ml   Last 3 Weights 01/16/2021 01/17/2021 01/16/2021  Weight (lbs) 215 lb 13.3 oz 217 lb 13 oz 226 lb 13.7 oz  Weight (kg) 97.9 kg 98.8 kg 102.9 kg      Telemetry    Atrial fibrillation with known LBBB. Rates in the 110's to 120's. - Personally Reviewed  ECG    No new ECG tracing today.- Personally Reviewed  Physical Exam   OMV:EHMCNOBS obese Caucasian female. No acute distress.   Neck:No JVD appreciated with patient  sitting completely upright. Cardiac:Tachycardic with irregularly irregular rhythm. No murmurs, rubs, or gallops.  Respiratory:No significant increased work of breathing. Crackles in bilateral bases. Decreased breath sounds in right base. JG:GEZM, non-distended, and non-tender. MS:1+ pitting edema of bilateral lower extremities. No deformity. Skin: Warm and dry. Neuro:No focal deficits. Psych: Normal affect. Responds appropriately.  Labs    High Sensitivity Troponin:   Recent Labs  Lab 01/11/2021 1904 12/27/2020 2104  TROPONINIHS 35* 34*      Chemistry Recent Labs  Lab 12/27/2020 1904 01/11/2021 2104 01/16/21 0437 01/16/21 1656 01/17/21 0318 12/29/2020 0403  NA 137  --  137  --  137 136  K 5.7*   < > 4.6  --  4.4 4.0  CL 97*  --  97*  --  96* 93*  CO2 31  --  31  --  33* 32  GLUCOSE 223*  --  190*  --  143* 157*  BUN 60*  --  63*  --  73* 81*  CREATININE 2.18*  --  2.16*  --  2.81* 3.00*  CALCIUM 9.4  --  9.2  --  8.9 8.5*  PROT 7.1  --   --  6.4*  --   --   ALBUMIN 3.8  --   --  3.4*  --   --   AST 29  --   --  14*  --   --   ALT 18  --   --  13  --   --   ALKPHOS 55  --   --  50  --   --   BILITOT 0.7  --   --  0.6  --   --   GFRNONAA 22*  --  22*  --  16* 15*  ANIONGAP 9  --  9  --  8 11   < > = values in this interval not displayed.     Hematology Recent Labs  Lab 01/14/2021 1904 01/16/21 0437 01/17/21 0318  WBC 5.8 6.0 5.6  RBC 3.59* 3.25* 3.18*  HGB 10.2* 9.4* 9.4*  HCT 34.2* 31.6* 29.9*  MCV 95.3 97.2 94.0  MCH 28.4 28.9 29.6  MCHC 29.8* 29.7* 31.4  RDW 16.1* 16.1* 16.3*  PLT 150 138* 133*    BNP Recent Labs  Lab 12/28/2020 1904  BNP 1,681.6*     DDimer No results for input(s): DDIMER in the last 168 hours.   Radiology    DG CHEST PORT 1 VIEW  Result Date: 01/17/2021 CLINICAL DATA:  Shortness of breath EXAM: PORTABLE CHEST 1 VIEW COMPARISON:  01/09/2021 FINDINGS: Stable cardiomegaly. Atherosclerotic calcification of the aortic knob. Small  bilateral pleural effusions, right greater than left. Associated bibasilar opacities. Diffuse interstitial prominence. No pneumothorax. IMPRESSION: Cardiomegaly with small bilateral pleural effusions and mild interstitial edema, suggesting CHF with mild pulmonary edema. No appreciable interval change from prior. Electronically Signed   By: Davina Poke D.O.   On: 01/17/2021 13:14   ECHOCARDIOGRAM COMPLETE  Result Date: 01/16/2021    ECHOCARDIOGRAM REPORT   Patient Name:   Patricia [name] Date of Exam: 01/16/2021 Medical Rec #:  [id number]      Height:       63.0 in Accession #:    [id number]     Weight:       209.0 lb Date of Birth:  09-22-1936     BSA:          1.970 m Patient Age:    85 years       BP:           209/63 mmHg Patient Gender: F              HR:           107 bpm. Exam Location:  Inpatient Procedure: 2D Echo Indications:    F29.24 Acute systolic (congestive) heart failure  History:        Patient has prior history of Echocardiogram examinations, most                 recent 11/07/2019. CHF.  Sonographer:    Luisa Hart RDCS Referring Phys: [id number] [place]  1. Left ventricular ejection fraction, by estimation, is 25 to 30%. The left ventricle has severely decreased function. The left ventricle demonstrates regional wall motion abnormalities) Abnormal (paradoxical) septal motion, consistent with left bundle  branch block. There is severe left ventricular hypertrophy. Left ventricular diastolic parameters are indeterminate.  2. Right ventricular systolic function is moderately reduced. The right ventricular size is normal. There is moderately elevated pulmonary artery systolic pressure. The estimated right ventricular systolic pressure is 17.7 mmHg.  3. Left atrial size was severely dilated.  4. Right atrial size was moderately dilated.  5. The mitral valve is abnormal. Mild  to moderate mitral valve regurgitation. Severe mitral annular calcification.  6. There is a 23 mm Edwards  Sapien prosthetic, stented (TAVR) valve present in the aortic position. Aortic valve regurgitation is not visualized. Aortic valve mean gradient measures 12.0 mmHg, which is reduced from prior echo 11/07/19, likely due to decreased systolic function. Vmax 2.1 m/s, MG 12 mmHg, EOA 1.1 cm^2, DI 0.44  7. The inferior vena cava is dilated in size with <50% respiratory variability, suggesting right atrial pressure of 15 mmHg. FINDINGS  Left Ventricle: Left ventricular ejection fraction, by estimation, is 25 to 30%. The left ventricle has severely decreased function. The left ventricle demonstrates regional wall motion abnormalities. 3D left ventricular ejection fraction analysis performed but not reported based on interpreter judgement due to suboptimal quality. The left ventricular internal cavity size was normal in size. There is severe left ventricular hypertrophy. Abnormal (paradoxical) septal motion, consistent with left bundle branch block. Left ventricular diastolic parameters are indeterminate. Right Ventricle: The right ventricular size is normal. Right vetricular wall thickness was not well visualized. Right ventricular systolic function is moderately reduced. There is moderately elevated pulmonary artery systolic pressure. The tricuspid regurgitant velocity is 2.89 m/s, and with an assumed right atrial pressure of 15 mmHg, the estimated right ventricular systolic pressure is 57.0 mmHg. Left Atrium: Left atrial size was severely dilated. Right Atrium: Right atrial size was moderately dilated. Pericardium: There is no evidence of pericardial effusion. Mitral Valve: The mitral valve is abnormal. Severe mitral annular calcification. Mild to moderate mitral valve regurgitation. Tricuspid Valve: The tricuspid valve is normal in structure. Tricuspid valve regurgitation is mild. Aortic Valve: The aortic valve has been repaired/replaced. Aortic valve regurgitation is not visualized. Aortic valve mean gradient measures  12.0 mmHg. Aortic valve peak gradient measures 17.1 mmHg. Aortic valve area, by VTI measures 1.13 cm. There is a  23 mm Edwards Sapien prosthetic, stented (TAVR) valve present in the aortic position. Pulmonic Valve: The pulmonic valve was not well visualized. Pulmonic valve regurgitation is not visualized. Aorta: The aortic root and ascending aorta are structurally normal, with no evidence of dilitation. Venous: The inferior vena cava is dilated in size with less than 50% respiratory variability, suggesting right atrial pressure of 15 mmHg. IAS/Shunts: The interatrial septum was not well visualized.  LEFT VENTRICLE PLAX 2D LVIDd:         3.90 cm LVIDs:         3.40 cm LV PW:         1.80 cm LV IVS:        2.00 cm LVOT diam:     1.80 cm LV SV:         37 LV SV Index:   19 LVOT Area:     2.54 cm  LV Volumes (MOD) LV vol d, MOD A2C: 119.0 ml LV vol d, MOD A4C: 74.5 ml LV vol s, MOD A2C: 74.1 ml LV vol s, MOD A4C: 67.8 ml LV SV MOD A2C:     44.9 ml LV SV MOD A4C:     74.5 ml LV SV MOD BP:      27.3 ml RIGHT VENTRICLE TAPSE (M-mode): 0.5 cm LEFT ATRIUM              Index        RIGHT ATRIUM           Index LA Vol (A2C):   202.0 ml 102.53 ml/m RA Area:     26.20 cm LA Vol (A4C):  139.0 ml 70.55 ml/m  RA Volume:   87.20 ml  44.26 ml/m LA Biplane Vol: 180.0 ml 91.36 ml/m  AORTIC VALVE                    PULMONIC VALVE AV Area (Vmax):    1.08 cm     PV Vmax:       0.99 m/s AV Area (Vmean):   1.00 cm     PV Vmean:      59.600 cm/s AV Area (VTI):     1.13 cm     PV VTI:        0.172 m AV Vmax:           207.00 cm/s  PV Peak grad:  3.9 mmHg AV Vmean:          156.500 cm/s PV Mean grad:  2.0 mmHg AV VTI:            0.329 m AV Peak Grad:      17.1 mmHg AV Mean Grad:      12.0 mmHg LVOT Vmax:         87.60 cm/s LVOT Vmean:        61.500 cm/s LVOT VTI:          0.146 m LVOT/AV VTI ratio: 0.44  AORTA Ao Asc diam: 2.50 cm MITRAL VALVE                 TRICUSPID VALVE MV Area (PHT): 6.27 cm      TR Peak grad:   33.4 mmHg MV  Decel Time: 121 msec      TR Vmax:        289.00 cm/s MR Peak grad:    85.4 mmHg MR Mean grad:    54.0 mmHg   SHUNTS MR Vmax:         462.00 cm/s Systemic VTI:  0.15 m MR Vmean:        350.0 cm/s  Systemic Diam: 1.80 cm MR PISA:         3.08 cm MR PISA Eff ROA: 14 mm MR PISA Radius:  0.70 cm MV E velocity: 181.00 cm/s Oswaldo Milian MD Electronically signed by Oswaldo Milian MD Signature Date/Time: 01/16/2021/4:16:28 PM    Final     Cardiac Studies   Right/Left Cardiac Catheterization 23/02/5731:  LV end diastolic pressure is moderately elevated.  There is severe aortic valve stenosis.  There is no mitral valve stenosis.  Prox LAD to Mid LAD lesion, 20 %stenosed.  Ost 3rd Mrg to 3rd Mrg lesion, 75 %stenosed.  Ost 2nd Mrg to 2nd Mrg lesion, 30 %stenosed.  Ost RCA to Dist RCA lesion, 10 %stenosed.  Hemodynamic findings consistent with mild pulmonary hypertension.  1. Single vessel obstructive CAD involving OM3 2. Moderate to severe aortic stenosis. Mean gradient 41 mm Hg, valve index 0.7 3. Mild pulmonary HTN 4. Elevated LV filling pressures. Prominent V waves on PCWP tracings c/w decreased LV compliance.  5. Normal cardiac output.   Plan: recommend consideration for AVR/ single vessel CABG.  _______________  Echocardiogram 01/16/2021: Impressions: 1. Left ventricular ejection fraction, by estimation, is 25 to 30%. The  left ventricle has severely decreased function. The left ventricle  demonstrates regional wall motion abnormalities) Abnormal (paradoxical)  septal motion, consistent with left bundle  branch block. There is severe left ventricular hypertrophy. Left  ventricular diastolic parameters are indeterminate.  2. Right ventricular systolic function is moderately reduced. The right  ventricular size is  normal. There is moderately elevated pulmonary artery  systolic pressure. The estimated right ventricular systolic pressure is  95.1 mmHg.  3. Left  atrial size was severely dilated.  4. Right atrial size was moderately dilated.  5. The mitral valve is abnormal. Mild to moderate mitral valve  regurgitation. Severe mitral annular calcification.  6. There is a 23 mm Edwards Sapien prosthetic, stented (TAVR) valve  present in the aortic position. Aortic valve regurgitation is not  visualized. Aortic valve mean gradient measures 12.0 mmHg, which is  reduced from prior echo 11/07/19, likely due to  decreased systolic function. Vmax 2.1 m/s, MG 12 mmHg, EOA 1.1 cm^2, DI  0.44  7. The inferior vena cava is dilated in size with <50% respiratory  variability, suggesting right atrial pressure of 15 mmHg.   Patient Profile     85 y.o. female with a history of single vessel CAD, aortic stenosis s/p TAVR in 2017, chronic combined CHF, paroxysmal atrial fibrillation with known LBBB not on anticoagulation due to recurrent GI bleed, COPD with chronic respiratory failure on 2 L of O2 at home, hypertension, hyperlipidemia, type 2 diabetes mellitus, CKD stage IV, anemia, and obesity who is being seen for evaluation of CHF at the request of dr. Posey Pronto.  Assessment & Plan    Acute on Chronic Combined CHF - BNP elevated at 1,681.  - Chest x-ray showed cardiomegaly with pulmonary edema and trace to small bilateral pleural effusions. - Echo showed LVEF of 25-30% with paradoxical septal motion consistent with LBBB and severe LVH. RV normal in sizes with moderately reduced systolic function and moderately elevated PASP of 48.4 mmHg. Also showed mild to moderate MR. - Initially received total of 152m of IV Lasix after admission yesterday with only minimal urinary output and worsening renal function so this was held. Pulmonology was consulted and restarted IV Lasix 1652mevery 8 hours. Only 500cc of urinary output yesterday. Weight 215 lbs today, down 2 lbs from yesterday. Creatinine continues to worsen and is 3.0 today. Lasix has been held again. - Patient is  agreeable to right heart cath today. This will help us[place]oreaith diuresis. May need inotrope if low output. Nephrology has also been consulted. - No ACE/ARB/ARNI or MRA due to renal function. - Initially had planned to start low dose Lopressor but it does not look like this was added. However, BP is still very soft. Will hold off for now until after right heart cath in case patient has low output. - Will hold off on Hydralazine/Imdur given soft BP. - Will stop Cardura to give us[place]oreaore BP room to add GDMT. - Continue to monitor daily weights, strict I/O's, and renal function.  The patient understands that risks include but are not limited to stroke (1 in 1000), death (1 in 1028 kidney failure [usually temporary] (1 in 500), bleeding (1 in 200), allergic reaction [possibly serious] (1 in 200), and agrees to proceed.   Paroxysmal Atrial Fibrillation - Longstanding history of paroxysmal s/p multiple DCCV in the past (last one in 07/2019). Previously on anticoagulation but did not tolerate this due to recurrent GI bleeds requiring transfusions. - Continue home Amiodarone. - Will hold beta-blocker for now given soft BP and possible low output CHF. - Difficult situation. Uncontrolled atrial fibrillation may be cause of decrease in EF. Unfortunately options are limited. Rate control limited by soft BP. Patient can not be on long-term anticoagulation given history of recurrent GI bleeds. Of note, she was able to tolerate 1 month  of anticoagulation following last DCCV in 2020. So this may be an option again. EP was consulted and did recommend another short term course of Eliquis and then repeat TEE/DCCV. May be candidate for CRT in the future. Not felt to be a Watchman's candidate. Will start Eliquis 2.54m twice daily tonight after RHC. If she hemoglobin remains stable on Eliquis, can plan for TEE/DCCV next week.  [facility] single vessel obstructive CAD involving OM3 and mild  non-obstructive disease elsewhere. Treated medically. - No angina symptoms. - Not on aspirin at home (assuming due to history of GI bleeds). - Continue statin.  Aortic Stenosis s/p TAVR in 2017 - Echo this admission showed aortic valve mean gradient of 12.0 mmHg which is reduced form prior Echo in 10/2019 (felt to be due to decreased systolic function).  Hyperlipidemia - LDL 61 in 10/2019. - Continue home Crestor.  Acute on Chronic CKD Stage IV - Creatinine 2.18 on admission and up to 3.0 today following diuresis. Recent baseline around 1.7 to 2.0. - See recommendations for diuresis above. - Continue to avoid Nephrotoxic agents. - Nephrology has been consulted.  Otherwise, per primary team: - COPD with chronic respiratory failure on home O2 - Obesity hypoventilation syndrome - Type 2 diabetes mellitus - Chronic anemia - Thrombocytopenia - Generalized deconditioning  For questions or updates, please contact [facility] Please consult [email or web address] for contact info under        Signed, CDarreld Mclean PA-C  12/30/2020, 12:52 PM    Patient seen and examined with CSande Rives PA-C.  Agree as above, with the following exceptions and changes as noted below.  She is very concerned about her atrial fibrillation.  We discussed that cardioversion may have to await better volume management since it would be unwise if she is grossly volume overloaded to attempt cardioversion.  She discussed CRT and watchman with Dr. LQuentin Orethis morning plan for outpatient review for these.  Given no response to diuresis thus far versus inaccurately tracked I's and O's, we will plan for right heart cath today.  We discussed this in great detail today and the patient is agreeable and understands this is so that we can better understand her volume status and attempt to optimally time cardioversion.  In addition we will have to reinitiate anticoagulation for which she has had significant GI bleeding  though slow per her report in the past. Gen: NAD, CV: iRRR, no murmurs, Lungs: clear, Abd: soft, Extrem: Warm, well perfused, no edema, Neuro/Psych: alert and oriented x 3, normal mood and affect. All available labs, radiology testing, previous records reviewed.   She has agreed to proceeding with right heart catheterization this afternoon.  We have held her lunch and made her n.p.o.  Volume status will be clear after.  There is still the question of possible pulmonary embolism and patient declined VQ scan yesterday.  She will likely have elevated pulmonary artery pressure on right heart cath, with known obstructive sleep apnea that is untreated.  May be difficult to better understand whether there is PE without imaging.  Would encourage her to consider VQ scan if possible.  Unable to get CT PE study because of renal function.  INFORMED CONSENT: I have reviewed the risks, indications, and alternatives to cardiac catheterization, possible angioplasty, and stenting with the patient. Risks include but are not limited to bleeding, infection, vascular injury, stroke, myocardial infection, arrhythmia, kidney injury, radiation-related injury in the case of prolonged fluoroscopy use, emergency cardiac  surgery, and death. The patient understands the risks of serious complication is 1-2 in 8003 with diagnostic cardiac cath and 1-2% or less with angioplasty/stenting.    Elouise Munroe, MD 01/03/2021 2:05 PM

## 2021-01-18 NOTE — Progress Notes (Signed)
Pt stated that she will not wear CPAP.  RN also spoke to pt about CPAP and pt refused.

## 2021-01-18 NOTE — Consult Note (Signed)
Consultation Note Date: 01/05/2021   Patient Name: Patricia [name]  DOB: 06/20/1936  MRN: [id number]  Age / Sex: 85 y.o., female  PCP: Patricia Chandler, NP Referring Physician: Aline August, MD  Reason for Consultation: Establishing goals of care  HPI/Patient Profile: 85 y.o. female  with past medical history of chronic systolic CHF, chronic respiratory failure with hypoxia on 2L oxygen at home, paroxysmal atrial fibrillation, COPD, s/p TAVR, CKD stage IV, type 2 DM, HTN, hypothyroidism, HLD, LBBB, and OHS not on CPAP. She presented to the emergency department on 01/13/2021 with shortness of breath. In the ED, chest x-ray with pulmonary edema and pleural effusions. BNP 1600. Admitted to [facility] for acute on chronic systolic CHF. Cardiology and PCCM consulted.   Echo 3/23 shows worsening EF (25-30% from prior of 40-45% in January 2021).  Clinical Assessment and Goals of Care: I have reviewed medical records including EPIC notes, labs and imaging, and discussed with primary RN. She reports patient has been declining heparin SQ and SCDs.  Patient is status post cardiac cath today. She is currently on lasix, amiodarone, and milrinone infusions. I met at bedside with patient and daughter/Patricia [name] to discuss diagnosis, prognosis, GOC, EOL wishes, disposition, and options. 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   I introduced Palliative Medicine as specialized medical care for people living with serious illness. It focuses on providing relief from the symptoms and stress of a serious illness.   We discussed a brief life review of the patient. 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 She is divorced. Daughter/Patricia [name] is her only child. Patient has 3 grand children and 1 great grandchild.   Patient lives alone in her home. Patricia [name] reports that she fell about 3 weeks ago. She  has ambulated with a walker for the past 2 weeks. Patient admits that her overall functional status has declined over the past few months, mostly due to shortness of breath.   We discussed her current illness and what it means in the larger context of her ongoing co-morbidities.  Natural disease trajectory of heart and respiratory failure was discussed, emphasizing that these are progressive diseases that worsen over time. Provided education that acute pulmonary edema (heart failure) is primary cause of current respiratory failure but her underlying chronic lung disease leaves her with minimal reserve.   I attempted to elicit values and goals of care important to the patient. She states her family and her dogs are what gives her life meaning. Her goal of care is to return home. She would be open to home health and PT services.     The difference between aggressive medical intervention and comfort care was considered in light of the patient's goals of care. The patient is interested in all full scope medical interventions offered. We did discuss code status. Encouraged patient to consider DNR/DNI status understanding evidenced based poor outcomes in similar hospitalized patients, as the cause of the arrest is likely associated with chronic/terminal  disease rather than a reversible acute cardio-pulmonary event.  I explained that DNR/DNI does not change the medical plan and it only comes into effect after a person has arrested (died).  It is a protective measure to keep [place] from harming the patient in their last moments of life. Patient was not agreeable to DNR/DNI with understanding that full code includes CPR, defibrillation, ACLS medications, and intubation. Patient understands prognosis would be poor in the event of a resuscitation event, but is clear in desire to continue full code status. She states, "I want that 10-15% chance" (of survival).  Attempted to discuss patient's wishes in the event she ever  needed intubation - inquired if she would want a time limited trial versus remaining on the ventilator long-term and undergoing tracheostomy. Patient became upset and declines to discuss further. Patricia [name] attempts to encourage her to continue the discussion, emphasizing that as her health care agent she needs to know her wishes. Patient does state that ultimately she would defer to Patricia [name] to make appropriate decisions as needed.    Advanced directives, concepts specific to code status, artifical feeding and hydration, and rehospitalization were considered and discussed. MOST form was introduced. Patient declines to complete form at this time - I provided Patricia [name] with a blank copy of the MOST form for review.    Patient has HCPOA and living will documents on file in [place]; completed in 2017. Patient states she has completed another living will since then; I asked Patricia [name] to bring a copy to the hospital.   Questions and concerns were addressed. Daughter was provided with PMT contact card and encouraged to call with questions or concerns.    Primary decision maker: Patient.  HCPOA is daughter Patricia [name]    SUMMARY OF RECOMMENDATIONS    Full code full scope  Continue current medical care   Patient is interested in all full scope medical interventions offered  Patient appears to have limited insight into her current medical situation  Goal of care is to return home; she would need home health and PT  Advanced directives on file in [place] have been updated; I asked daughter to bring a copy of the most recent documents  PMT will continue to follow  Code Status/Advance Care Planning:  Full code  Symptom Management:   Alprazolam (Xanax) 0.25 mg 3 times daily PRN anxiety  Quetiapine (Seroquel) 12.5 mg daily at bedtime  Additional Recommendations (Limitations, Scope, Preferences):  Full Scope Treatment  Psycho-social/Spiritual:   Created space and opportunity for patient and family to express  thoughts and feelings regarding patient's current medical situation.   Emotional support provided   Prognosis:   Unable to determine  Discharge Planning: To Be Determined      Primary Diagnoses: Present on Admission: . Acute on chronic systolic (congestive) heart failure ([place]) . COPD (chronic obstructive pulmonary disease) ([place]) . Hypothyroidism . Paroxysmal atrial fibrillation ([facility]) . Acute on chronic respiratory failure with hypoxia ([place]) . Hypertension associated with diabetes ([place]) . Hyperlipidemia associated with type 2 diabetes mellitus ([place]) . Obesity hypoventilation syndrome ([place])   I have reviewed the medical record, interviewed the patient and family, and examined the patient. The following aspects are pertinent.  Past Medical History:  Diagnosis Date  . Allergy   . Anemia, unspecified   . Arthritis   . Asthma   . Atrial fibrillation status post cardioversion [facility]) 09/2015   s/p TEE/DCCV>>SR on amio  . Benign neoplasm of colon   . Carpal tunnel syndrome   .  Cellulitis and abscess of finger, unspecified   . Cerumen impaction   . Cervicalgia   . CHF (congestive heart failure) ([place])   . Chronic airway obstruction, not elsewhere classified   . Chronic anticoagulation 09/2015   Eliquis  . Diarrhea   . Diffuse cystic mastopathy   . Edema   . Encounter for long-term (current) use of other medications   . GERD (gastroesophageal reflux disease)   . Heart murmur   . Lumbago   . Neck pain 05/23/2015  . Obesity, unspecified   . Other and unspecified hyperlipidemia   . Other psoriasis   . Pain in joint, lower leg   . PONV (postoperative nausea and vomiting)   . Primary localized osteoarthrosis of right shoulder 12/18/2015  . 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 Scoliosis (and kyphoscoliosis), idiopathic   . Shortness of breath dyspnea     with exertion  . Type II or unspecified type diabetes mellitus without mention of complication, uncontrolled   . Unspecified essential hypertension   . Unspecified hemorrhoids without mention of complication   . Unspecified hypothyroidism   . Urge incontinence     Family History  Problem Relation Age of Onset  . Diabetes Father   . Stroke Father   . Heart disease Father   . Liver cancer Sister   . Heart disease Brother   . Asthma Sister   . Arthritis Sister   . Breast cancer Neg Hx    Scheduled Meds: . apixaban  2.5 mg Oral BID  . fluticasone furoate-vilanterol  1 puff Inhalation Daily   And  . umeclidinium bromide  1 puff Inhalation Daily  . insulin aspart  0-5 Units Subcutaneous QHS  . insulin aspart  0-9 Units Subcutaneous TID WC  . levothyroxine  88 mcg Oral Q0600  . pantoprazole  40 mg Oral BID  . QUEtiapine  12.5 mg Oral QHS  . rosuvastatin  10 mg Oral Daily  . sodium chloride flush  3 mL Intravenous Q12H   Continuous Infusions: . amiodarone 60 mg/hr (01/20/2021 1752)   Followed by  . [START ON 01/19/2021] amiodarone    . furosemide (LASIX) 200 mg in dextrose 5% 100 mL (48m/mL) infusion 20 mg/hr (12/29/2020 1743)  . milrinone 0.25 mcg/kg/min (01/22/2021 1758)   PRN Meds:.acetaminophen **OR** acetaminophen, albuterol, azelastine, dextromethorphan-guaiFENesin, methocarbamol, Muscle Rub, ondansetron **OR** ondansetron (ZOFRAN) IV Medications Prior to Admission:  Prior to Admission medications   Medication Sig Start Date End Date Taking? Authorizing Provider  acetaminophen (TYLENOL) 650 MG CR tablet Take 1,300 mg by mouth every 8 (eight) hours as needed for pain.   Yes [provider]  albuterol (PROVENTIL) (2.5 MG/3ML) 0.083% nebulizer solution USE 1 VIAL IN NEBULIZER EVERY 4 HOURS AND AS NEEDED. Generic: VENTOLIN Patient taking differently: Take 2.5 mg by nebulization every 4 (four) hours as needed for shortness of breath or wheezing. 07/12/20  Yes RBrand Males MD  albuterol (VENTOLIN HFA) 108 (90 Base) MCG/ACT inhaler INHALE 2 PUFFS INTO THE LUNGS EVERY 6 HOURS AS NEEDED FOR WHEEZING OR SHORTNESS OF BREATH Patient taking differently: Inhale 2 puffs into the lungs every 6 (six) hours as needed for shortness of breath or wheezing. 10/10/20  Yes ELauree Chandler NP  amiodarone (PACERONE) 200 MG tablet Take 1 tablet (200 mg total) by mouth daily. 06/29/20  Yes JMartinique [name] M, MD  azelastine (ASTELIN) 0.1 %  nasal spray Place 1 spray into both nostrils 2 (two) times daily. Use in each nostril as directed Patient taking differently: Place 1 spray into both nostrils 2 (two) times daily as needed. Use in each nostril as directed 03/02/20  Yes Martyn Ehrich, NP  Blood Glucose Monitoring Suppl [facility] VERIO) w/Device KIT Check blood sugar twice daily DX:E11.9 10/12/19  Yes Patricia Chandler, NP  cetirizine (ZYRTEC) 10 MG tablet Take 10 mg by mouth daily as needed for allergies.    Yes [provider]  colestipol (COLESTID) 1 g tablet Take 1 g by mouth 2 (two) times daily. 06/29/20  Yes [provider]  Dextromethorphan-guaiFENesin (MUCINEX DM) 30-600 MG TB12 Take 2 tablets by mouth as needed (cough).   Yes [provider]  doxazosin (CARDURA) 4 MG tablet TAKE 1 TABLET BY MOUTH EVERY DAY TO HELP CONTROL BLOOD PRESSURE Patient taking differently: Take 4 mg by mouth daily. 09/12/20  Yes Patricia Chandler, NP  ferrous sulfate 325 (65 FE) MG EC tablet Take 325 mg by mouth 2 (two) times daily.   Yes [provider]  furosemide (LASIX) 40 MG tablet TAKE 2 TABLETS BY MOUTH DAILY AND 1 TABLET BY MOUTH AT LUNCH on  Mondays, Wednesdays and Fridays Patient taking differently: Take 40-80 mg by mouth See admin instructions. 2 tablets in the morning  daily. Take 1 tablet at lunch on m,w,f in addition to morning dose 09/17/20  Yes Eubanks, Carlos American, NP  glimepiride (AMARYL) 1 MG tablet TAKE 1 TABLET(1 MG) BY MOUTH DAILY WITH  BREAKFAST Patient taking differently: Take 1 mg by mouth daily with breakfast. 11/19/20  Yes Patricia Chandler, NP  glucose blood test strip 1 each by Other route 2 (two) times daily. OneTouch Verio DX E11.8 10/12/19  Yes Eubanks, Carlos American, NP  JANUVIA 50 MG tablet TAKE 1 TABLET(50 MG) BY MOUTH DAILY Patient taking differently: Take 50 mg by mouth daily. 10/10/20  Yes Patricia Chandler, NP  Lancets [facility] ULTRASOFT) lancets 1 each by Other route 2 (two) times daily. OneTouch Verio DX E11.9 10/12/19  Yes Patricia Chandler, NP  levothyroxine (SYNTHROID) 88 MCG tablet TAKE 1 TABLET BY MOUTH DAILY ON AN EMPTY STOMACH Patient taking differently: Take 88 mcg by mouth daily before breakfast. 08/29/20  Yes Eubanks, Carlos American, NP  OVER THE COUNTER MEDICATION Place 1 drop into both eyes daily. [facility] brand lubricating eye drop   Yes [provider]  OXYGEN Inhale 2 L into the lungs continuous. Pt also uses 4L pulse via POC   Yes [provider]  pantoprazole (PROTONIX) 40 MG tablet TAKE 1 TABLET(40 MG) BY MOUTH TWICE DAILY Patient taking differently: Take 40 mg by mouth 2 (two) times daily. 07/11/20  Yes Patricia Chandler, NP  potassium gluconate 595 (99 K) MG TABS tablet Take 595 mg by mouth 2 (two) times daily.   Yes [provider]  predniSONE (DELTASONE) 10 MG tablet Take 4 tabs po daily x 3 days; then 3 tabs daily x3 days; then 2 tabs daily x3 days; then 1 tab daily x 3 days; then stop 01/14/21  Yes Martyn Ehrich, NP  rosuvastatin (CRESTOR) 10 MG tablet TAKE 1 TABLET(10 MG) BY MOUTH DAILY Patient taking differently: Take 10 mg by mouth daily. 10/31/20  Yes Patricia Chandler, NP  TRELEGY ELLIPTA 200-62.5-25 MCG/INH AEPB INHALE 1 PUFF INTO THE LUNGS DAILY 10/15/20  Yes Martyn Ehrich, NP  cholecalciferol (VITAMIN D) 1000 units tablet Take  1,000 Units by mouth at bedtime.     [provider]   Allergies  Allergen Reactions  . Codeine Other (See  Comments)    "Spaces out" the patient and she cannot walk well  . Vesicare [Solifenacin] Itching   Review of Systems  Neurological: Positive for weakness.  Psychiatric/Behavioral: Positive for sleep disturbance.    Physical Exam Vitals reviewed.  Constitutional:      General: She is not in acute distress.    Appearance: She is ill-appearing.  Cardiovascular:     Rate and Rhythm: Normal rate.  Pulmonary:     Effort: Pulmonary effort is normal.  Neurological:     Mental Status: She is alert and oriented to person, place, and time.     Motor: Weakness present.     Vital Signs: BP 103/72   Pulse 100   Temp (!) 97.5 F (36.4 C) (Oral)   Resp 20   Ht 5' 2" (1.575 m)   Wt 97.9 kg   SpO2 97%   BMI 39.48 kg/m  Pain Scale: 0-10   Pain Score: 0-No pain   SpO2: SpO2: 97 % O2 Device:SpO2: 97 % O2 Flow Rate: .O2 Flow Rate (L/min): 4 L/min  IO: Intake/output summary:   Intake/Output Summary (Last 24 hours) at 01/05/2021 1903 Last data filed at 01/07/2021 1725 Gross per 24 hour  Intake 730 ml  Output 500 ml  Net 230 ml    LBM: Last BM Date: 01/04/2021 Baseline Weight: Weight: 102.9 kg Most recent weight: Weight: 97.9 kg      Palliative Assessment/Data: PPS 40%     Time In: 1850 Time Out: 1903 Time Total: 73 minutes Greater than 50%  of this time was spent counseling and coordinating care related to the above assessment and plan.  Signed by: Lavena Bullion, NP   Please contact Palliative Medicine Team phone at [phone] for questions and concerns.  For individual provider: See Shea Evans

## 2021-01-18 NOTE — Progress Notes (Signed)
   01/11/2021 1955  Assess: MEWS Score  Temp 97.7 F (36.5 C)  BP (!) 96/53  ECG Heart Rate (!) 120  Resp 19  Level of Consciousness Alert  SpO2 96 %  O2 Device Nasal Cannula  O2 Flow Rate (L/min) 4 L/min  Assess: MEWS Score  MEWS Temp 0  MEWS Systolic 1  MEWS Pulse 2  MEWS RR 0  MEWS LOC 0  MEWS Score 3  MEWS Score Color Yellow  Assess: if the MEWS score is Yellow or Red  Were vital signs taken at a resting state? Yes  Focused Assessment No change from prior assessment  Early Detection of Sepsis Score *See Row Information* Low  MEWS guidelines implemented *See Row Information* No, previously yellow, continue vital signs every 4 hours  Treat  MEWS Interventions Administered scheduled meds/treatments  Pain Scale 0-10  Pain Score 9  Notify: Charge Nurse/RN  Name of Charge Nurse/RN Notified Lyn RN  Date Charge Nurse/RN Notified 12/29/2020  Time Charge Nurse/RN Notified 2000  Document  Patient Outcome  (Stable and remains on unit)  Progress note created (see row info) Yes

## 2021-01-18 NOTE — Progress Notes (Signed)
NAME:  Patricia [name], MRN:  [id number], DOB:  [birth year], LOS: 2 ADMISSION DATE:  01/03/2021, CONSULTATION DATE:  01/17/21 REFERRING MD:  Starla Link - TRH, CHIEF COMPLAINT:  SOB  History of Present Illness:  85 yo F PMH OHS, CKD IV, Afib, COPD, chronic hypoxic failure on home 2L O2, s/p TAVR, CAD HFrEF who presented to ED 3/22 for SOB. Associated hypoxia with patient noting sats 88% on 2L, bilateral lower extremity pitting edema, and fluctuating elevated HR. BNP 1681, CXR with bilateral pleural effusions and pulmonary edema. Admitted to [facility] for acute heart failure exacerbation. Got 83m lasix in ED, and total of 1481mLasix 3/23 without a robust response. ECHO obtained 3/23 which revealed LVEF 25-30%, LVH, reduced RV systolic function but normal RV size. Severely dilated LA. Moderately dilated RA.    3/24 Cr has bumped and lasix being held. Cardiology suggests PCCM consult to evaluate SOB for pulmonary etiology of SOB.  Pertinent  Medical History  OHS Moderate COPD  Chronic hypoxic respiratory failure Systolic heart failure Pulmonary hypertension  Afib AS s/p TAVR CAD DM2 CKD IV Hypothyroidism  Significant Hospital Events: Including procedures, antibiotic start and stop dates in addition to other pertinent events   [facility] w [facility] edema, pleural effusions. BNP 1600. Got 40 lasix . 3/23 cards consulted. ECHO with worse EF (25% from prior 40%) and new RV dysfunction. got higher dose lasix did not have robust response.  . 3/24 Creatinine bumped, lasix held. pulm consulted for SOB.   Interim History / Subjective:  Today breathing is feeling improved since yesterday. Did not have time to try CPAP during the day yesterday and refused overnight. She is willing to try today.  Objective   Blood pressure 106/69, pulse (!) 110, temperature 97.8 F (36.6 C), temperature source Oral, resp. rate 19, height _0  (1.575 m), weight 97.9 kg, SpO2 97 %.         Intake/Output Summary (Last 24 hours) at 01/13/2021 1108 Last data filed at 01/07/2021 0440 Gross per 24 hour  Intake 490 ml  Output 500 ml  Net -10 ml   Filed Weights   01/16/21 1506 01/17/21 0605 12/27/2020 0030  Weight: 102.9 kg 98.8 kg 97.9 kg    Examination: General: chronically ill appearing woman sitting up in the chair HENT: [place]/AT, eyes anicteric Neck: JVD Lungs: [place], reduced basilar breath sounds R>L, no wheezing. Tachypnea, but no accessory muscle use Cardiovascular: S1S2, RRR Abdomen: obese, soft, NT, ND Extremities: Pitting edema below knees bilaterally, no cyanosis or clubbing. Stasis dermatitis both shins. Neuro: alert, answering questions appropriately  Labs/imaging that I havepersonally reviewed   BUN 81 Cr 3.0   Resolved Hospital Problem list     Assessment & Plan:   Acute on chronic hypoxic and hypercapneic respiratory failure due to OHS, likely OSA, COPD on 2L HOT. Likely uncontrolled respiratory disease is complicating heart failure and leading to worsened right sided heart failure. Acute pulmonary edema is the primary cause of her current respiratory failure, but her chronic underlying lung disease leaves her with minimal reserve. -She will try CPAP again- discussed with RT. If she cannot use NIPPV when sleeping I think her cardiopulmonary disease will be very challenging to manage moving forward. -Supplemental O2 to maintain SpO2 88-92%. -think that VQ scan is likely low yield -- doubt PE. Per Cardiology's notes she would be difficult to maintain on chronic AC due to previous bleeding issues. -Diuresis; suspect her renal function will  eventually improve with diuresis. -con't bronchodilators   Acute on chronic heart systolic heart failure with new biventricular dysfunction. A-fib LBBB -Appreciate Cardiology and EP's assistance. She needs diuresis. I agree that procedures for device related therapy would be hard for her to tolerate currently. I suspect  if she required any sedation she would need to be intubated due to her chronic pulmonary disease, especially OHS.  -Cardiology considering TEE; would like her volume status better managed before DCCV if possible. Will defer to cardiology.  AKI on CKD IV- possibly cardiorenal syndrome -con't to monitor -diuresis    Best practice (evaluated daily)  Per primary  Labs   CBC: Recent Labs  Lab 01/09/2021 1904 01/16/21 0437 01/17/21 0318  WBC 5.8 6.0 5.6  NEUTROABS 5.3  --  4.2  HGB 10.2* 9.4* 9.4*  HCT 34.2* 31.6* 29.9*  MCV 95.3 97.2 94.0  PLT 150 138* 133*    Basic Metabolic Panel: Recent Labs  Lab 01/17/2021 1904 01/14/2021 2104 01/16/21 0437 01/17/21 0318 01/10/2021 0403  NA 137  --  137 137 136  K 5.7* 5.0 4.6 4.4 4.0  CL 97*  --  97* 96* 93*  CO2 31  --  31 33* 32  GLUCOSE 223*  --  190* 143* 157*  BUN 60*  --  63* 73* 81*  CREATININE 2.18*  --  2.16* 2.81* 3.00*  CALCIUM 9.4  --  9.2 8.9 8.5*  MG  --   --  1.9 1.9 2.0    Julian Hy, DO 01/08/2021 11:26 AM [facility]  For contact information, see Amion. If no response to pager, please call PCCM consult pager. After hours, 7PM- 7AM, please call Elink.

## 2021-01-18 NOTE — Progress Notes (Signed)
Occupational Therapy Treatment Patient Details Name: Patricia [name] MRN: [id number] DOB: 04/27/1936 Today's Date: 01/06/2021    History of present illness Pt is an 85 y.o. female admitted 12/27/2020 with worsening SOB and BLE edema. CXR showed cardiomegaly with pulmonary edema and trace to small bilateral pleural effusions. Workup for CHF exacerbation, PAF, AKI. PMH includes HTN, DM, COPD, afib (not on anticoag due to GIB), anemia, s/p TAVR (2017).   OT comments  Patient up in recliner, nice progress for stated OT goals.  Patient able to walk in room with [facility], perform stand grooming with [employer], and complete toileting task.  Min A to stand from commode and supervision for hygiene.  Patient states she started feeling weak in the knees on the walk back from the commode to the recliner.  Acute OT continues to be indicated; patient is hoping to return home at max functional level for mobility and ADL.    Follow Up Recommendations  Home health OT    Equipment Recommendations  None recommended by OT    Recommendations for Other Services      Precautions / Restrictions Precautions Precautions: Fall Restrictions Weight Bearing Restrictions: No       Mobility Bed Mobility               General bed mobility comments: up in a recliner    Transfers Overall transfer level: Needs assistance Equipment used: Rolling walker (2 wheeled) Transfers: Sit to/from Stand Sit to Stand: Min guard;Min assist         General transfer comment: min A from toilet    Balance Overall balance assessment: Needs assistance Sitting-balance support: No upper extremity supported;Feet supported Sitting balance-Leahy Scale: Fair     Standing balance support: Bilateral upper extremity supported Standing balance-Leahy Scale: Poor Standing balance comment: using RW for stand balance.                           ADL either performed or assessed with clinical judgement   ADL        Grooming: Wash/dry hands;Wash/dry face;Min Dispensing optician: Min guard;Ambulation;Comfort height toilet   Toileting- Clothing Manipulation and Hygiene: Supervision/safety;Sitting/lateral lean       Functional mobility during ADLs: Min guard;Rolling walker                         Cognition Arousal/Alertness: Awake/alert Behavior During Therapy: WFL for tasks assessed/performed Overall Cognitive Status: Within Functional Limits for tasks assessed                                                            Pertinent Vitals/ Pain       Pain Assessment: No/denies pain                                                          Frequency  Min 2X/week        Progress Toward Goals  OT Goals(current  goals can now be found in the care plan section)  Progress towards OT goals: Progressing toward goals  Acute Rehab OT Goals Patient Stated Goal: Hoping to get stronger, my legs are feeling weak OT Goal Formulation: With patient Time For Goal Achievement: 01/31/21 Potential to Achieve Goals: Good  Plan Discharge plan remains appropriate    Co-evaluation                 AM-PAC OT "6 Clicks" Daily Activity     Outcome Measure   Help from another person eating meals?: None Help from another person taking care of personal grooming?: A Little Help from another person toileting, which includes using toliet, bedpan, or urinal?: A Little Help from another person bathing (including washing, rinsing, drying)?: A Little Help from another person to put on and taking off regular upper body clothing?: None Help from another person to put on and taking off regular lower body clothing?: A Little 6 Click Score: 20    End of Session Equipment Utilized During Treatment: Rolling walker;Oxygen  OT Visit Diagnosis: Unsteadiness on feet (R26.81);Muscle weakness (generalized) (M62.81);History  of falling (Z91.81);Dizziness and giddiness (R42)   Activity Tolerance Patient tolerated treatment well   Patient Left in chair;with call bell/phone within reach   Nurse Communication Mobility status        Time: 1010-1035 OT Time Calculation (min): 25 min  Charges: OT General Charges $OT Visit: 1 Visit OT Treatments $Self Care/Home Management : 23-37 mins  12/31/2020  Rich, OTR/L  Acute Rehabilitation Services  Office:  [phone]    [name] 12/30/2020, 10:41 AM

## 2021-01-18 NOTE — Progress Notes (Addendum)
Progress Note  Patient Name: Patricia [name] Date of Encounter: 12/28/2020  [facility] Cardiologist: [name] Martinique, MD   Subjective   No acute overnight events. Patient actually feels like she is breathing better than yesterday but she had minimal urine output with high dose Lasix. No chest pain or palpitations.  Inpatient Medications    Scheduled Meds: . amiodarone  200 mg Oral Daily  . fluticasone furoate-vilanterol  1 puff Inhalation Daily   And  . umeclidinium bromide  1 puff Inhalation Daily  . heparin  5,000 Units Subcutaneous Q8H  . insulin aspart  0-5 Units Subcutaneous QHS  . insulin aspart  0-9 Units Subcutaneous TID WC  . levothyroxine  88 mcg Oral Q0600  . pantoprazole  40 mg Oral BID  . QUEtiapine  12.5 mg Oral QHS  . rosuvastatin  10 mg Oral Daily  . sodium chloride flush  3 mL Intravenous Q12H   Continuous Infusions: . sodium chloride     PRN Meds: acetaminophen **OR** acetaminophen, albuterol, azelastine, dextromethorphan-guaiFENesin, methocarbamol, Muscle Rub, ondansetron **OR** ondansetron (ZOFRAN) IV   Vital Signs    Vitals:   01/12/2021 0437 01/02/2021 0739 01/02/2021 1038 01/06/2021 1241  BP: (!) 99/52 106/69    Pulse: (!) 113 (!) 110    Resp: 20 19    Temp: 97.7 F (36.5 C) 97.8 F (36.6 C)    TempSrc: Oral Oral    SpO2: 99% 100% 97% 98%  Weight:      Height:        Intake/Output Summary (Last 24 hours) at 01/02/2021 1252 Last data filed at 12/25/2020 0440 Gross per 24 hour  Intake 490 ml  Output 500 ml  Net -10 ml   Last 3 Weights 01/11/2021 01/17/2021 01/16/2021  Weight (lbs) 215 lb 13.3 oz 217 lb 13 oz 226 lb 13.7 oz  Weight (kg) 97.9 kg 98.8 kg 102.9 kg      Telemetry    Atrial fibrillation with known LBBB. Rates in the 110's to 120's. - Personally Reviewed  ECG    No new ECG tracing today.- Personally Reviewed  Physical Exam   ZOX:WRUEAVWU obese Caucasian female. No acute distress.   Neck:No JVD appreciated with patient  sitting completely upright. Cardiac:Tachycardic with irregularly irregular rhythm. No murmurs, rubs, or gallops.  Respiratory:No significant increased work of breathing. Crackles in bilateral bases. Decreased breath sounds in right base. JW:JXBJ, non-distended, and non-tender. MS:1+ pitting edema of bilateral lower extremities. No deformity. Skin: Warm and dry. Neuro:No focal deficits. Psych: Normal affect. Responds appropriately.  Labs    High Sensitivity Troponin:   Recent Labs  Lab 01/22/2021 1904 12/26/2020 2104  TROPONINIHS 35* 34*      Chemistry Recent Labs  Lab 01/14/2021 1904 01/03/2021 2104 01/16/21 0437 01/16/21 1656 01/17/21 0318 01/11/2021 0403  NA 137  --  137  --  137 136  K 5.7*   < > 4.6  --  4.4 4.0  CL 97*  --  97*  --  96* 93*  CO2 31  --  31  --  33* 32  GLUCOSE 223*  --  190*  --  143* 157*  BUN 60*  --  63*  --  73* 81*  CREATININE 2.18*  --  2.16*  --  2.81* 3.00*  CALCIUM 9.4  --  9.2  --  8.9 8.5*  PROT 7.1  --   --  6.4*  --   --   ALBUMIN 3.8  --   --  3.4*  --   --   AST 29  --   --  14*  --   --   ALT 18  --   --  13  --   --   ALKPHOS 55  --   --  50  --   --   BILITOT 0.7  --   --  0.6  --   --   GFRNONAA 22*  --  22*  --  16* 15*  ANIONGAP 9  --  9  --  8 11   < > = values in this interval not displayed.     Hematology Recent Labs  Lab 01/03/2021 1904 01/16/21 0437 01/17/21 0318  WBC 5.8 6.0 5.6  RBC 3.59* 3.25* 3.18*  HGB 10.2* 9.4* 9.4*  HCT 34.2* 31.6* 29.9*  MCV 95.3 97.2 94.0  MCH 28.4 28.9 29.6  MCHC 29.8* 29.7* 31.4  RDW 16.1* 16.1* 16.3*  PLT 150 138* 133*    BNP Recent Labs  Lab 01/03/2021 1904  BNP 1,681.6*     DDimer No results for input(s): DDIMER in the last 168 hours.   Radiology    DG CHEST PORT 1 VIEW  Result Date: 01/17/2021 CLINICAL DATA:  Shortness of breath EXAM: PORTABLE CHEST 1 VIEW COMPARISON:  12/28/2020 FINDINGS: Stable cardiomegaly. Atherosclerotic calcification of the aortic knob. Small  bilateral pleural effusions, right greater than left. Associated bibasilar opacities. Diffuse interstitial prominence. No pneumothorax. IMPRESSION: Cardiomegaly with small bilateral pleural effusions and mild interstitial edema, suggesting CHF with mild pulmonary edema. No appreciable interval change from prior. Electronically Signed   By: Davina Poke D.O.   On: 01/17/2021 13:14   ECHOCARDIOGRAM COMPLETE  Result Date: 01/16/2021    ECHOCARDIOGRAM REPORT   Patient Name:   Patricia [name] Date of Exam: 01/16/2021 Medical Rec #:  [id number]      Height:       63.0 in Accession #:    [id number]     Weight:       209.0 lb Date of Birth:  [birth year]     BSA:          1.970 m Patient Age:    85 years       BP:           209/63 mmHg Patient Gender: F              HR:           107 bpm. Exam Location:  Inpatient Procedure: 2D Echo Indications:    V95.63 Acute systolic (congestive) heart failure  History:        Patient has prior history of Echocardiogram examinations, most                 recent 11/07/2019. CHF.  Sonographer:    Luisa Hart RDCS Referring Phys: [id number] [place]  1. Left ventricular ejection fraction, by estimation, is 25 to 30%. The left ventricle has severely decreased function. The left ventricle demonstrates regional wall motion abnormalities) Abnormal (paradoxical) septal motion, consistent with left bundle  branch block. There is severe left ventricular hypertrophy. Left ventricular diastolic parameters are indeterminate.  2. Right ventricular systolic function is moderately reduced. The right ventricular size is normal. There is moderately elevated pulmonary artery systolic pressure. The estimated right ventricular systolic pressure is 29.5 mmHg.  3. Left atrial size was severely dilated.  4. Right atrial size was moderately dilated.  5. The mitral valve is abnormal. Mild  to moderate mitral valve regurgitation. Severe mitral annular calcification.  6. There is a 23 mm Edwards  Sapien prosthetic, stented (TAVR) valve present in the aortic position. Aortic valve regurgitation is not visualized. Aortic valve mean gradient measures 12.0 mmHg, which is reduced from prior echo 11/07/19, likely due to decreased systolic function. Vmax 2.1 m/s, MG 12 mmHg, EOA 1.1 cm^2, DI 0.44  7. The inferior vena cava is dilated in size with <50% respiratory variability, suggesting right atrial pressure of 15 mmHg. FINDINGS  Left Ventricle: Left ventricular ejection fraction, by estimation, is 25 to 30%. The left ventricle has severely decreased function. The left ventricle demonstrates regional wall motion abnormalities. 3D left ventricular ejection fraction analysis performed but not reported based on interpreter judgement due to suboptimal quality. The left ventricular internal cavity size was normal in size. There is severe left ventricular hypertrophy. Abnormal (paradoxical) septal motion, consistent with left bundle branch block. Left ventricular diastolic parameters are indeterminate. Right Ventricle: The right ventricular size is normal. Right vetricular wall thickness was not well visualized. Right ventricular systolic function is moderately reduced. There is moderately elevated pulmonary artery systolic pressure. The tricuspid regurgitant velocity is 2.89 m/s, and with an assumed right atrial pressure of 15 mmHg, the estimated right ventricular systolic pressure is 63.8 mmHg. Left Atrium: Left atrial size was severely dilated. Right Atrium: Right atrial size was moderately dilated. Pericardium: There is no evidence of pericardial effusion. Mitral Valve: The mitral valve is abnormal. Severe mitral annular calcification. Mild to moderate mitral valve regurgitation. Tricuspid Valve: The tricuspid valve is normal in structure. Tricuspid valve regurgitation is mild. Aortic Valve: The aortic valve has been repaired/replaced. Aortic valve regurgitation is not visualized. Aortic valve mean gradient measures  12.0 mmHg. Aortic valve peak gradient measures 17.1 mmHg. Aortic valve area, by VTI measures 1.13 cm. There is a  23 mm Edwards Sapien prosthetic, stented (TAVR) valve present in the aortic position. Pulmonic Valve: The pulmonic valve was not well visualized. Pulmonic valve regurgitation is not visualized. Aorta: The aortic root and ascending aorta are structurally normal, with no evidence of dilitation. Venous: The inferior vena cava is dilated in size with less than 50% respiratory variability, suggesting right atrial pressure of 15 mmHg. IAS/Shunts: The interatrial septum was not well visualized.  LEFT VENTRICLE PLAX 2D LVIDd:         3.90 cm LVIDs:         3.40 cm LV PW:         1.80 cm LV IVS:        2.00 cm LVOT diam:     1.80 cm LV SV:         37 LV SV Index:   19 LVOT Area:     2.54 cm  LV Volumes (MOD) LV vol d, MOD A2C: 119.0 ml LV vol d, MOD A4C: 74.5 ml LV vol s, MOD A2C: 74.1 ml LV vol s, MOD A4C: 67.8 ml LV SV MOD A2C:     44.9 ml LV SV MOD A4C:     74.5 ml LV SV MOD BP:      27.3 ml RIGHT VENTRICLE TAPSE (M-mode): 0.5 cm LEFT ATRIUM              Index        RIGHT ATRIUM           Index LA Vol (A2C):   202.0 ml 102.53 ml/m RA Area:     26.20 cm LA Vol (A4C):  139.0 ml 70.55 ml/m  RA Volume:   87.20 ml  44.26 ml/m LA Biplane Vol: 180.0 ml 91.36 ml/m  AORTIC VALVE                    PULMONIC VALVE AV Area (Vmax):    1.08 cm     PV Vmax:       0.99 m/s AV Area (Vmean):   1.00 cm     PV Vmean:      59.600 cm/s AV Area (VTI):     1.13 cm     PV VTI:        0.172 m AV Vmax:           207.00 cm/s  PV Peak grad:  3.9 mmHg AV Vmean:          156.500 cm/s PV Mean grad:  2.0 mmHg AV VTI:            0.329 m AV Peak Grad:      17.1 mmHg AV Mean Grad:      12.0 mmHg LVOT Vmax:         87.60 cm/s LVOT Vmean:        61.500 cm/s LVOT VTI:          0.146 m LVOT/AV VTI ratio: 0.44  AORTA Ao Asc diam: 2.50 cm MITRAL VALVE                 TRICUSPID VALVE MV Area (PHT): 6.27 cm      TR Peak grad:   33.4 mmHg MV  Decel Time: 121 msec      TR Vmax:        289.00 cm/s MR Peak grad:    85.4 mmHg MR Mean grad:    54.0 mmHg   SHUNTS MR Vmax:         462.00 cm/s Systemic VTI:  0.15 m MR Vmean:        350.0 cm/s  Systemic Diam: 1.80 cm MR PISA:         3.08 cm MR PISA Eff ROA: 14 mm MR PISA Radius:  0.70 cm MV E velocity: 181.00 cm/s Oswaldo Milian MD Electronically signed by Oswaldo Milian MD Signature Date/Time: 01/16/2021/4:16:28 PM    Final     Cardiac Studies   Right/Left Cardiac Catheterization 79/0/2409:  LV end diastolic pressure is moderately elevated.  There is severe aortic valve stenosis.  There is no mitral valve stenosis.  Prox LAD to Mid LAD lesion, 20 %stenosed.  Ost 3rd Mrg to 3rd Mrg lesion, 75 %stenosed.  Ost 2nd Mrg to 2nd Mrg lesion, 30 %stenosed.  Ost RCA to Dist RCA lesion, 10 %stenosed.  Hemodynamic findings consistent with mild pulmonary hypertension.  1. Single vessel obstructive CAD involving OM3 2. Moderate to severe aortic stenosis. Mean gradient 41 mm Hg, valve index 0.7 3. Mild pulmonary HTN 4. Elevated LV filling pressures. Prominent V waves on PCWP tracings c/w decreased LV compliance.  5. Normal cardiac output.   Plan: recommend consideration for AVR/ single vessel CABG.  _______________  Echocardiogram 01/16/2021: Impressions: 1. Left ventricular ejection fraction, by estimation, is 25 to 30%. The  left ventricle has severely decreased function. The left ventricle  demonstrates regional wall motion abnormalities) Abnormal (paradoxical)  septal motion, consistent with left bundle  branch block. There is severe left ventricular hypertrophy. Left  ventricular diastolic parameters are indeterminate.  2. Right ventricular systolic function is moderately reduced. The right  ventricular size is  normal. There is moderately elevated pulmonary artery  systolic pressure. The estimated right ventricular systolic pressure is  49.2 mmHg.  3. Left  atrial size was severely dilated.  4. Right atrial size was moderately dilated.  5. The mitral valve is abnormal. Mild to moderate mitral valve  regurgitation. Severe mitral annular calcification.  6. There is a 23 mm Edwards Sapien prosthetic, stented (TAVR) valve  present in the aortic position. Aortic valve regurgitation is not  visualized. Aortic valve mean gradient measures 12.0 mmHg, which is  reduced from prior echo 11/07/19, likely due to  decreased systolic function. Vmax 2.1 m/s, MG 12 mmHg, EOA 1.1 cm^2, DI  0.44  7. The inferior vena cava is dilated in size with <50% respiratory  variability, suggesting right atrial pressure of 15 mmHg.   Patient Profile     85 y.o. female with a history of single vessel CAD, aortic stenosis s/p TAVR in 2017, chronic combined CHF, paroxysmal atrial fibrillation with known LBBB not on anticoagulation due to recurrent GI bleed, COPD with chronic respiratory failure on 2 L of O2 at home, hypertension, hyperlipidemia, type 2 diabetes mellitus, CKD stage IV, anemia, and obesity who is being seen for evaluation of CHF at the request of dr. Posey Pronto.  Assessment & Plan    Acute on Chronic Combined CHF - BNP elevated at 1,681.  - Chest x-ray showed cardiomegaly with pulmonary edema and trace to small bilateral pleural effusions. - Echo showed LVEF of 25-30% with paradoxical septal motion consistent with LBBB and severe LVH. RV normal in sizes with moderately reduced systolic function and moderately elevated PASP of 48.4 mmHg. Also showed mild to moderate MR. - Initially received total of 179m of IV Lasix after admission yesterday with only minimal urinary output and worsening renal function so this was held. Pulmonology was consulted and restarted IV Lasix 1610mevery 8 hours. Only 500cc of urinary output yesterday. Weight 215 lbs today, down 2 lbs from yesterday. Creatinine continues to worsen and is 3.0 today. Lasix has been held again. - Patient is  agreeable to right heart cath today. This will help usKoreaith diuresis. May need inotrope if low output. Nephrology has also been consulted. - No ACE/ARB/ARNI or MRA due to renal function. - Initially had planned to start low dose Lopressor but it does not look like this was added. However, BP is still very soft. Will hold off for now until after right heart cath in case patient has low output. - Will hold off on Hydralazine/Imdur given soft BP. - Will stop Cardura to give us[place]oreaore BP room to add GDMT. - Continue to monitor daily weights, strict I/O's, and renal function.  The patient understands that risks include but are not limited to stroke (1 in 1000), death (1 in 1089 kidney failure [usually temporary] (1 in 500), bleeding (1 in 200), allergic reaction [possibly serious] (1 in 200), and agrees to proceed.   Paroxysmal Atrial Fibrillation - Longstanding history of paroxysmal s/p multiple DCCV in the past (last one in 07/2019). Previously on anticoagulation but did not tolerate this due to recurrent GI bleeds requiring transfusions. - Continue home Amiodarone. - Will hold beta-blocker for now given soft BP and possible low output CHF. - Difficult situation. Uncontrolled atrial fibrillation may be cause of decrease in EF. Unfortunately options are limited. Rate control limited by soft BP. Patient can not be on long-term anticoagulation given history of recurrent GI bleeds. Of note, she was able to tolerate 1 month  of anticoagulation following last DCCV in 2020. So this may be an option again. EP was consulted and did recommend another short term course of Eliquis and then repeat TEE/DCCV. May be candidate for CRT in the future. Not felt to be a Watchman's candidate. Will start Eliquis 2.54m twice daily tonight after RHC. If she hemoglobin remains stable on Eliquis, can plan for TEE/DCCV next week.  [facility] single vessel obstructive CAD involving OM3 and mild  non-obstructive disease elsewhere. Treated medically. - No angina symptoms. - Not on aspirin at home (assuming due to history of GI bleeds). - Continue statin.  Aortic Stenosis s/p TAVR in 2017 - Echo this admission showed aortic valve mean gradient of 12.0 mmHg which is reduced form prior Echo in 10/2019 (felt to be due to decreased systolic function).  Hyperlipidemia - LDL 61 in 10/2019. - Continue home Crestor.  Acute on Chronic CKD Stage IV - Creatinine 2.18 on admission and up to 3.0 today following diuresis. Recent baseline around 1.7 to 2.0. - See recommendations for diuresis above. - Continue to avoid Nephrotoxic agents. - Nephrology has been consulted.  Otherwise, per primary team: - COPD with chronic respiratory failure on home O2 - Obesity hypoventilation syndrome - Type 2 diabetes mellitus - Chronic anemia - Thrombocytopenia - Generalized deconditioning  For questions or updates, please contact [facility] Please consult [email or web address] for contact info under        Signed, CDarreld Mclean PA-C  01/08/2021, 12:52 PM    Patient seen and examined with CSande Rives PA-C.  Agree as above, with the following exceptions and changes as noted below.  She is very concerned about her atrial fibrillation.  We discussed that cardioversion may have to await better volume management since it would be unwise if she is grossly volume overloaded to attempt cardioversion.  She discussed CRT and watchman with Dr. LQuentin Orethis morning plan for outpatient review for these.  Given no response to diuresis thus far versus inaccurately tracked I's and O's, we will plan for right heart cath today.  We discussed this in great detail today and the patient is agreeable and understands this is so that we can better understand her volume status and attempt to optimally time cardioversion.  In addition we will have to reinitiate anticoagulation for which she has had significant GI bleeding  though slow per her report in the past. Gen: NAD, CV: iRRR, no murmurs, Lungs: clear, Abd: soft, Extrem: Warm, well perfused, no edema, Neuro/Psych: alert and oriented x 3, normal mood and affect. All available labs, radiology testing, previous records reviewed.   She has agreed to proceeding with right heart catheterization this afternoon.  We have held her lunch and made her n.p.o.  Volume status will be clear after.  There is still the question of possible pulmonary embolism and patient declined VQ scan yesterday.  She will likely have elevated pulmonary artery pressure on right heart cath, with known obstructive sleep apnea that is untreated.  May be difficult to better understand whether there is PE without imaging.  Would encourage her to consider VQ scan if possible.  Unable to get CT PE study because of renal function.  INFORMED CONSENT: I have reviewed the risks, indications, and alternatives to cardiac catheterization, possible angioplasty, and stenting with the patient. Risks include but are not limited to bleeding, infection, vascular injury, stroke, myocardial infection, arrhythmia, kidney injury, radiation-related injury in the case of prolonged fluoroscopy use, emergency cardiac  surgery, and death. The patient understands the risks of serious complication is 1-2 in 0932 with diagnostic cardiac cath and 1-2% or less with angioplasty/stenting.    Elouise Munroe, MD 12/31/2020 2:05 PM

## 2021-01-18 NOTE — Progress Notes (Signed)
Heart Failure Stewardship Pharmacist Progress Note   PCP: [name], NP PCP-Cardiologist: [name] Martinique, MD    HPI:  85 yo F with PMH of CAD, aortic stenosis, CHF, atrial fibrillation, GIB, HTN, HLD, T2DM, CKD IV, anemia, and obesity. She presented to the ED on 01/06/2021 with shortness of breath, othopnea, and peripheral edema. She was then admitted for acute on chronic systolic CHF. An ECHO was done on 01/16/21 and LVEF was 25-30% (down from 40-45% in Jan 2021).   Current HF Medications: Furosemide 160 mg IV x 1 at 0300  Prior to admission HF Medications: Furosemide 80 mg qAM, 40 mg qPM, and 40 mg with lunch on MWF  Pertinent Lab Values: . Serum creatinine 2.16>2.81>3.00, BUN 81, Potassium 4.0, Sodium 136, BNP 1681.6, Magnesium 2.0  Vital Signs: . Weight: 257 lbs (admission weight: 226 lbs) . Blood pressure: 100-120/60s  . Heart rate: 110s  Medication Assistance / Insurance Benefits Check: Does the patient have prescription insurance?  Yes Type of insurance plan: UHC Medicare  Does the patient qualify for medication assistance through manufacturers or grants?   Pending . Eligible grants and/or patient assistance programs: pending . Medication assistance applications in progress: none  . Medication assistance applications approved: none Approved medication assistance renewals will be completed by: pending  Outpatient Pharmacy:  Prior to admission outpatient pharmacy: [facility] Is the patient willing to use [facility] at discharge? Yes Is the patient willing to transition their outpatient pharmacy to utilize a [facility] outpatient pharmacy?   Pending    Assessment: 1. Acute on chronic systolic CHF (EF 22-02%), due to ICM. NYHA class III symptoms. - IV lasix dosing per cardiology - Consider starting low dose HF BB once euvolemic and no need for further diuresis  - Consider starting Entresto/losartan once SCr improves - Consider starting spironolactone prior to  discharge pending SCr and BP trends  - Also taking Januvia PTA - increased risk for HF. Would switch to Farxiga/Jardiance prior to discharge (pending SCr improvement) and stop Januvia.   Plan: 1) Medication changes recommended at this time: - Continue current regimen - Could start low dose BB if no further diuresis is required - GDMT limited by renal dysfunction  2) Patient assistance: - Farxiga/Jardiance copay $47 per month - Entresto copay $47 per month  3)  Education  - To be completed prior to discharge  Kerby Nora, PharmD, BCPS Heart Failure Cytogeneticist Phone [phone]

## 2021-01-18 NOTE — Consult Note (Addendum)
Advanced Heart Failure Team Consult Note   Primary Physician: [name], NP PCP-Cardiologist:  [name] Martinique, MD  Reason for Consultation: Heart Failure   HPI:    Patricia [name] is seen today for evaluation of heart failure  at the request of Dr Ellyn Hack.   Patricia [name] is a 85 year old with a history of paroxysmal atrial fibrillation with known LBBB>>not on anticoagulation due to recurrent GIB, HTN, HLD, AS s/p TAVR 2017, LHC CAD with 75% OM1, chronic systolic and diastolic CHF with LVEF at 40-45%, CKD stage IV, anemia, obesity, chronic respiratory failure on 2L supplemental O2 and DM2 y of AKI, CKD Stage IV, and biventricular heart failure.   Presented to [facility] with increased shortness of breath. CXR c/w pulmonary edema, trace pleural effusions, and cardiomegaly. Started on IV lasix with poor response and worsening renal function. Cardiology consulted and placed her on high dose IV lasix with poor response. Remains short of breath with minimal exertion. Also back in A fib so eliquis was started.   Creatinine on admit 2.2 and today up to 3. Nephrology consulted. Renal US pending.   RHC today with RA pressure 19, cardiac output 4.4, CI 2.1, and PA sat 50%.   ECHO 01/16/21 EF 25-30% RV moderately reduced, LA severely dilated, RA moderately dilated, Prosthetic TAVR , IVC 15   Review of Systems: [y] = yes, [ ] = no   . General: Weight gain [ Y]; Weight loss [ ]; Anorexia [ ]; Fatigue [Y ]; Fever [ ]; Chills [ ]; Weakness [Y ]  . Cardiac: Chest pain/pressure [ ]; Resting SOB [ ]; Exertional SOB [ Y]; Orthopnea [ Y]; Pedal Edema [ ]; Palpitations [ ]; Syncope [ ]; Presyncope [ ]; Paroxysmal nocturnal dyspnea[ ]  . Pulmonary: Cough [ ]; Wheezing[ ]; Hemoptysis[ ]; Sputum [ ]; Snoring [ ]  . GI: Vomiting[ ]; Dysphagia[ ]; Melena[ ]; Hematochezia [ ]; Heartburn[ ]; Abdominal pain [ ]; Constipation [ ]; Diarrhea [ ]; BRBPR [ ]  . GU: Hematuria[ ]; Dysuria [ ]; Nocturia[ ]  . Vascular:  Pain in legs with walking [ ]; Pain in feet with lying flat [ ]; Non-healing sores [ ]; Stroke [ ]; TIA [ ]; Slurred speech [ ];  . Neuro: Headaches[ ]; Vertigo[ ]; Seizures[ ]; Paresthesias[ ];Blurred vision [ ]; Diplopia [ ]; Vision changes [ ]  . Ortho/Skin: Arthritis [ ]; Joint pain [ ]; Muscle pain [ ]; Joint swelling [ ]; Back Pain [ ]; Rash [ ]  . Psych: Depression[ ]; Anxiety[ ]  . Heme: Bleeding problems [ ]; Clotting disorders [ ]; Anemia [ Y]  . Endocrine: Diabetes [Y ]; Thyroid dysfunction[ ]  Home Medications Prior to Admission medications   Medication Sig Start Date End Date Taking? Authorizing Provider  acetaminophen (TYLENOL) 650 MG CR tablet Take 1,300 mg by mouth every 8 (eight) hours as needed for pain.   Yes [provider]  albuterol (PROVENTIL) (2.5 MG/3ML) 0.083% nebulizer solution USE 1 VIAL IN NEBULIZER EVERY 4 HOURS AND AS NEEDED. Generic: VENTOLIN Patient taking differently: Take 2.5 mg by nebulization every 4 (four) hours as needed for shortness of breath or wheezing. 07/12/20  Yes Brand Males, MD  albuterol (VENTOLIN HFA) 108 (90 Base) MCG/ACT inhaler INHALE 2 PUFFS INTO THE LUNGS EVERY 6 HOURS AS NEEDED FOR WHEEZING OR SHORTNESS OF BREATH Patient taking differently: Inhale 2 puffs into the lungs every 6 (six) hours as needed  for shortness of breath or wheezing. 10/10/20  Yes [name], NP  amiodarone (PACERONE) 200 MG tablet Take 1 tablet (200 mg total) by mouth daily. 06/29/20  Yes Martinique, [name] M, MD  azelastine (ASTELIN) 0.1 % nasal spray Place 1 spray into both nostrils 2 (two) times daily. Use in each nostril as directed Patient taking differently: Place 1 spray into both nostrils 2 (two) times daily as needed. Use in each nostril as directed 03/02/20  Yes Martyn Ehrich, NP  Blood Glucose Monitoring Suppl [facility] VERIO) w/Device KIT Check blood sugar twice daily DX:E11.9 10/12/19  Yes [name], NP  cetirizine (ZYRTEC) 10 MG  tablet Take 10 mg by mouth daily as needed for allergies.    Yes [provider]  colestipol (COLESTID) 1 g tablet Take 1 g by mouth 2 (two) times daily. 06/29/20  Yes [provider]  Dextromethorphan-guaiFENesin (MUCINEX DM) 30-600 MG TB12 Take 2 tablets by mouth as needed (cough).   Yes [provider]  doxazosin (CARDURA) 4 MG tablet TAKE 1 TABLET BY MOUTH EVERY DAY TO HELP CONTROL BLOOD PRESSURE Patient taking differently: Take 4 mg by mouth daily. 09/12/20  Yes [name], NP  ferrous sulfate 325 (65 FE) MG EC tablet Take 325 mg by mouth 2 (two) times daily.   Yes [provider]  furosemide (LASIX) 40 MG tablet TAKE 2 TABLETS BY MOUTH DAILY AND 1 TABLET BY MOUTH AT LUNCH on  Mondays, Wednesdays and Fridays Patient taking differently: Take 40-80 mg by mouth See admin instructions. 2 tablets in the morning  daily. Take 1 tablet at lunch on m,w,f in addition to morning dose 09/17/20  Yes Eubanks, Carlos American, NP  glimepiride (AMARYL) 1 MG tablet TAKE 1 TABLET(1 MG) BY MOUTH DAILY WITH BREAKFAST Patient taking differently: Take 1 mg by mouth daily with breakfast. 11/19/20  Yes [name], NP  glucose blood test strip 1 each by Other route 2 (two) times daily. OneTouch Verio DX E11.8 10/12/19  Yes Eubanks, Carlos American, NP  JANUVIA 50 MG tablet TAKE 1 TABLET(50 MG) BY MOUTH DAILY Patient taking differently: Take 50 mg by mouth daily. 10/10/20  Yes [name], NP  Lancets [facility] ULTRASOFT) lancets 1 each by Other route 2 (two) times daily. OneTouch Verio DX E11.9 10/12/19  Yes [name], NP  levothyroxine (SYNTHROID) 88 MCG tablet TAKE 1 TABLET BY MOUTH DAILY ON AN EMPTY STOMACH Patient taking differently: Take 88 mcg by mouth daily before breakfast. 08/29/20  Yes Eubanks, Carlos American, NP  OVER THE COUNTER MEDICATION Place 1 drop into both eyes daily. [facility] brand lubricating eye drop   Yes [provider]  OXYGEN Inhale 2 L  into the lungs continuous. Pt also uses 4L pulse via POC   Yes [provider]  pantoprazole (PROTONIX) 40 MG tablet TAKE 1 TABLET(40 MG) BY MOUTH TWICE DAILY Patient taking differently: Take 40 mg by mouth 2 (two) times daily. 07/11/20  Yes [name], NP  potassium gluconate 595 (99 K) MG TABS tablet Take 595 mg by mouth 2 (two) times daily.   Yes [provider]  predniSONE (DELTASONE) 10 MG tablet Take 4 tabs po daily x 3 days; then 3 tabs daily x3 days; then 2 tabs daily x3 days; then 1 tab daily x 3 days; then stop 01/14/21  Yes Martyn Ehrich, NP  rosuvastatin (CRESTOR) 10 MG tablet TAKE 1 TABLET(10 MG) BY MOUTH DAILY Patient taking differently:  Take 10 mg by mouth daily. 10/31/20  Yes [name], NP  TRELEGY ELLIPTA 200-62.5-25 MCG/INH AEPB INHALE 1 PUFF INTO THE LUNGS DAILY 10/15/20  Yes Martyn Ehrich, NP  cholecalciferol (VITAMIN D) 1000 units tablet Take 1,000 Units by mouth at bedtime.     [provider]    Past Medical History: Past Medical History:  Diagnosis Date  . Allergy   . Anemia, unspecified   . Arthritis   . Asthma   . Atrial fibrillation status post cardioversion [facility]) 09/2015   s/p TEE/DCCV>>SR on amio  . Benign neoplasm of colon   . Carpal tunnel syndrome   . Cellulitis and abscess of finger, unspecified   . Cerumen impaction   . Cervicalgia   . CHF (congestive heart failure) ([place])   . Chronic airway obstruction, not elsewhere classified   . Chronic anticoagulation 09/2015   Eliquis  . Diarrhea   . Diffuse cystic mastopathy   . Edema   . Encounter for long-term (current) use of other medications   . GERD (gastroesophageal reflux disease)   . Heart murmur   . Lumbago   . Neck pain 05/23/2015  . Obesity, unspecified   . Other and unspecified hyperlipidemia   . Other psoriasis   . Pain in joint, lower leg   . PONV (postoperative nausea and vomiting)   . Primary localized osteoarthrosis of right shoulder  12/18/2015  . 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 Scoliosis (and kyphoscoliosis), idiopathic   . Shortness of breath dyspnea    with exertion  . Type II or unspecified type diabetes mellitus without mention of complication, uncontrolled   . Unspecified essential hypertension   . Unspecified hemorrhoids without mention of complication   . Unspecified hypothyroidism   . Urge incontinence     Past Surgical History: Past Surgical History:  Procedure Laterality Date  . ABDOMINAL HYSTERECTOMY  1987   complete  . BREAST BIOPSY Right   . BREAST EXCISIONAL BIOPSY Left 2011  . BREAST EXCISIONAL BIOPSY Right    x2  . BREAST SURGERY     right breast 3 surgeries [phone]  . CARDIAC CATHETERIZATION N/A 09/02/2016   Procedure: Right/Left Heart Cath and Coronary Angiography;  Surgeon: [name] M Martinique, MD;  Location: [place] CV LAB;  Service: Cardiovascular;  Laterality: N/A;  . CARDIOVERSION N/A 10/19/2015   Procedure: CARDIOVERSION;  Surgeon: Lelon Perla, MD;  Location: [facility] ENDOSCOPY;  Service: Cardiovascular;  Laterality: N/A;  . CARDIOVERSION N/A 11/13/2017   Procedure: CARDIOVERSION;  Surgeon: Jerline Pain, MD;  Location: [facility] ENDOSCOPY;  Service: Cardiovascular;  Laterality: N/A;  . CARDIOVERSION N/A 07/28/2019   Procedure: CARDIOVERSION;  Surgeon: Buford Dresser, MD;  Location: [place];  Service: Cardiovascular;  Laterality: N/A;  . [place]  . COLON SURGERY     2004,2007,2013.  3 times colon surgieres  . ESOPHAGOGASTRODUODENOSCOPY (EGD) WITH PROPOFOL Left 03/13/2017   Procedure: ESOPHAGOGASTRODUODENOSCOPY (EGD) WITH PROPOFOL;  Surgeon: Ronnette Juniper, MD;  Location: [place];  Service: Gastroenterology;  Laterality: Left;  . ESOPHAGOGASTRODUODENOSCOPY (EGD) WITH PROPOFOL Left 01/22/2018   Procedure:  ESOPHAGOGASTRODUODENOSCOPY (EGD) WITH PROPOFOL;  Surgeon: Arta Silence, MD;  Location: [facility] ENDOSCOPY;  Service: Endoscopy;  Laterality: Left;  . EXCISE LE MANDIBULAR LYMPH NODE T     DR C. NEWMAN  . FOOT SURGERY  1989  . GIVENS CAPSULE  STUDY Left 01/23/2018   Procedure: GIVENS CAPSULE STUDY;  Surgeon: Wilford Corner, MD;  Location: [facility] ENDOSCOPY;  Service: Endoscopy;  Laterality: Left;  . LEFT SHOULDER ARTHROSCOPY    . 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 NEUROPLASTY / TRANSPOSITION MEDIAN NERVE AT CARPAL TUNNEL BILATERAL  05/2003  . TEE WITHOUT CARDIOVERSION N/A 10/19/2015   Procedure: Transesophageal Echocardiogram (TEE) ;  Surgeon: Lelon Perla, MD;  Location: [facility] ENDOSCOPY;  Service: Cardiovascular;  Laterality: N/A;  . TEE WITHOUT CARDIOVERSION N/A 10/14/2016   Procedure: TRANSESOPHAGEAL ECHOCARDIOGRAM (TEE);  Surgeon: Sherren Mocha, MD;  Location: [place];  Service: Open Heart Surgery;  Laterality: N/A;  . TEE WITHOUT CARDIOVERSION N/A 07/28/2019   Procedure: TRANSESOPHAGEAL ECHOCARDIOGRAM (TEE);  Surgeon: Buford Dresser, MD;  Location: [facility] ENDOSCOPY;  Service: Cardiovascular;  Laterality: N/A;  . TOTAL SHOULDER ARTHROPLASTY Right 12/18/2015   Procedure: TOTAL SHOULDER ARTHROPLASTY;  Surgeon: [name], MD;  Location: [place];  Service: Orthopedics;  Laterality: Right;  . TRANSCATHETER AORTIC VALVE REPLACEMENT, TRANSFEMORAL N/A 10/14/2016   Procedure: TRANSCATHETER AORTIC VALVE REPLACEMENT, TRANSFEMORAL;  Surgeon: Sherren Mocha, MD;  Location: [place];  Service: Open Heart Surgery;  Laterality: N/A;  . TRIGGER FINGER RELEASE Left   . VAGINAL CYST REMOVED  1967    Family History: Family History  Problem Relation Age of Onset  . Diabetes Father   . Stroke Father   . Heart disease Father   . Liver cancer Sister   . Heart disease Brother   . Asthma Sister   . Arthritis Sister   . Breast cancer Neg Hx     Social History: Social History   Socioeconomic History  .  Marital status: Divorced    Spouse name: Not on file  . Number of children: 1  . Years of education: Not on file  . Highest education level: Not on file  Occupational History  . Occupation: Retired Market researcher for [employer]  Tobacco Use  . Smoking status: Former Smoker    Packs/day: 1.00    Years: 40.00    Pack years: 40.00    Quit date: 09/07/1989    Years since quitting: 31.3  . Smokeless tobacco: Never Used  Vaping Use  . Vaping Use: Never used  Substance and Sexual Activity  . Alcohol use: No    Alcohol/week: 0.0 standard drinks  . Drug use: No  . Sexual activity: Never  Other Topics Concern  . Not on file  Social History Narrative   Her daughter & 3 grandchildren live in the area.     Social Determinants of Health   Financial Resource Strain: Not on file  Food Insecurity: Not on file  Transportation Needs: Not on file  Physical Activity: Not on file  Stress: Not on file  Social Connections: Not on file    Allergies:  Allergies  Allergen Reactions  . Codeine Other (See Comments)    "Spaces out" the patient and she cannot walk well  . Vesicare [Solifenacin] Itching    Objective:    Vital Signs:   Temp:  [97.5 F (36.4 C)-98.2 F (36.8 C)] 97.5 F (36.4 C) (03/25 1612) Pulse Rate:  [100-114] 100 (03/25 1612) Resp:  [17-20] 20 (03/25 1612) BP: (99-123)/(39-69) 101/63 (03/25 1612) SpO2:  [97 %-100 %] 97 % (03/25 1612) FiO2 (%):  [32 %] 32 % (03/25 1241) Weight:  [97.9 kg] 97.9 kg (03/25 0030) Last BM Date: 12/31/2020  Weight change: Filed Weights   01/16/21 1506 01/17/21 0605 01/01/2021 0030  Weight: 102.9 kg 98.8 kg 97.9 kg    Intake/Output:   Intake/Output Summary (Last 24 hours) at 01/11/2021 1631 Last data filed at 01/21/2021 0440 Gross per 24 hour  Intake 490 ml  Output 500 ml  Net -10 ml      Physical Exam    General:  Elderly  No resp difficulty HEENT: normal Neck: supple. JVP to jaw  . Carotids 2+ bilat; no bruits. No  lymphadenopathy or thyromegaly appreciated. Cor: PMI nondisplaced. Irregular rate & rhythm. No rubs, gallops or murmurs. Lungs: clear Abdomen: soft, nontender, nondistended. No hepatosplenomegaly. No bruits or masses. Good bowel sounds. Extremities: no cyanosis, clubbing, rash, R and LLE 1-2+  edema Neuro: alert & orientedx3, cranial nerves grossly intact. moves all 4 extremities w/o difficulty. Affect pleasant   Telemetry    A fib 100s   EKG    01/16/21 A Fib with LBBB   Labs   Basic Metabolic Panel: Recent Labs  Lab 12/25/2020 1904 01/16/2021 2104 01/16/21 0437 01/17/21 0318 12/30/2020 0403  NA 137  --  137 137 136  K 5.7* 5.0 4.6 4.4 4.0  CL 97*  --  97* 96* 93*  CO2 31  --  31 33* 32  GLUCOSE 223*  --  190* 143* 157*  BUN 60*  --  63* 73* 81*  CREATININE 2.18*  --  2.16* 2.81* 3.00*  CALCIUM 9.4  --  9.2 8.9 8.5*  MG  --   --  1.9 1.9 2.0    Liver Function Tests: Recent Labs  Lab 01/21/2021 1904 01/16/21 1656  AST 29 14*  ALT 18 13  ALKPHOS 55 50  BILITOT 0.7 0.6  PROT 7.1 6.4*  ALBUMIN 3.8 3.4*   No results for input(s): LIPASE, AMYLASE in the last 168 hours. No results for input(s): AMMONIA in the last 168 hours.  CBC: Recent Labs  Lab 01/14/2021 1904 01/16/21 0437 01/17/21 0318  WBC 5.8 6.0 5.6  NEUTROABS 5.3  --  4.2  HGB 10.2* 9.4* 9.4*  HCT 34.2* 31.6* 29.9*  MCV 95.3 97.2 94.0  PLT 150 138* 133*    Cardiac Enzymes: No results for input(s): CKTOTAL, CKMB, CKMBINDEX, TROPONINI in the last 168 hours.  BNP: BNP (last 3 results) Recent Labs    09/17/20 1455 12/17/20 0022 01/04/2021 1904  BNP 572* 574.3* 1,681.6*    ProBNP (last 3 results) No results for input(s): PROBNP in the last 8760 hours.   CBG: Recent Labs  Lab 01/17/21 0536 01/17/21 1142 01/17/21 1644 01/03/2021 0740 01/23/2021 1149  GLUCAP 158* 126* 143* 137* 176*    Coagulation Studies: No results for input(s): LABPROT, INR in the last 72 hours.   Imaging   CARDIAC  CATHETERIZATION  Result Date: 01/17/2021  Hemodynamic findings consistent with severe pulmonary hypertension. Mean PAP 46-48 mmHg  Moderately reduced cardiac output of index with PA sat of 50%; CO-CI of 4.37-2.21  Patient still remains significant volume overloaded with elevated PA pressures.  Unfortunately her anatomy did not allow [place] to complete the full right heart catheterization with wedge pressure.  If absolutely indicated in the future, would consider left brachial versus IJ access.  However the patient did not seem to be interested in having another procedure. Glenetta Hew, MD     Medications:     Current Medications: . amiodarone  200 mg Oral Daily  . apixaban  2.5 mg Oral BID  . fluticasone furoate-vilanterol  1 puff Inhalation Daily   And  . umeclidinium bromide  1 puff Inhalation Daily  . insulin aspart  0-5 Units Subcutaneous QHS  . insulin aspart  0-9 Units Subcutaneous TID WC  . levothyroxine  88 mcg Oral Q0600  . pantoprazole  40 mg Oral BID  . QUEtiapine  12.5 mg Oral QHS  . rosuvastatin  10 mg Oral Daily  . sodium chloride flush  3 mL Intravenous Q12H     Infusions: . milrinone          Assessment/Plan   1. A/C Biventricular HF  Echo shows EF down to 25-30% from previous 40-45%. Had [place] back 2017 75% OM1  - NICM - LBBB/Tachymediated. - RHC with elevated RA unable to wedge, PA 61/17, CO 4.3, CI 2.2, PA sat 50%.  - Volume overloaded. Give 80 mg IV lasix now and start lasix drip at 20 mg per hour. - Start milrinone 0.25 mcg with low PA sat and to augment diuresis.   -No GDMT with AKI.    2. AKI on CKD Stage 4  -Neprology consulted  -Creatinine rising 2.2-->3  - Renal US.   3. PAF--A fib on admit  In A fib on admit . Maintained on po amiodarone - Start amio drip while on milrinone.  -Previously not on anticoagulants due to GI bleed. She has been put back on elquis 2.5 mg twice a day.  -EP following. Poor candidate for Watchman with co-morbidities.    4. LBBB EP consulted. Per EP not a candidate for ICD /CRTD with co morbidities.   5. COPD   6. H/O TAVR 2017  7. [place] Would benefit from Palliative Care Consult     Length of Stay: 2  Darrick Grinder, NP  12/28/2020, 4:31 PM  Advanced Heart Failure Team Pager [phone] (M-F; 7a - 5p)  Please contact [facility] for night-coverage after hours (4p -7a ) and weekends on [email or web address]   Patient seen with NP, agree with the above note.   See HPI above.  Patient admitted with atrial fibrillation/RVR and CHF, has developed progressive AKI on CKD stage IIIb with poor diuresis.  RHC today showed elevated RV filling pressure and elevated PA pressure, unable to get PCWP.   General: NAD Neck: JVP 16 cm, no thyromegaly or thyroid nodule.  Lungs: Crackles at bases. CV: Nondisplaced PMI.  Heart mildly tachy, irregular S1/S2, no S3/S4, no murmur.  1+ edema to knees.  No carotid bruit.  Normal pedal pulses.  Abdomen: Soft, nontender, no hepatosplenomegaly, no distention.  Skin: Intact without lesions or rashes.  Neurologic: Alert and oriented x 3.  Psych: Normal affect. Extremities: No clubbing or cyanosis.  HEENT: Normal.   Assessment/Plan: 1. Acute on chronic systolic CHF: Cause of cardiomyopathy uncertain.  Patient has history of 1-vessel CAD with 75% OM3 stenosis on 11/17 cath.  Cannot rule out tachy-mediated CMP.  Echo this admission with EF 25-30%, severe LVH, moderately decreased RV systolic function, mild-moderate MR, stable TAVR valve with mean gradient 12 mmHg, IVC dilated. RHC today with elevated RA pressure and PA pressure, unable to get PCWP.  CI 2.2, SBP 90s-100s.  Diuresis has been attempted, currently on high dose Lasix 160 mg IV every 8 hrs.  Poor response so far with creatinine up from baseline around 2 to 3.0 today.  Progressive cardiorenal syndrome.  - Change Lasix => 80 mg IV bolus now then 20 mg/hr.   - Will try her on milrinone 0.25 mcg/kg/min with progressive renal  dysfunction, marginal cardiac index, and elevated PA pressure.  - Will aim for  TEE-guided DCCV in the future to get her back into NSR.  - Has wide LBBB, may be CRT candidate down the road but would need significant stabilization.  2. CAD: LHC in 11/17 with 75% OM3.  Not candidate for coronary angiography at this point with AKI and creatinine up to 3.  No chest pain.  - Continue statin.  3. Atrial fibrillation: History of PAF, admitted here with AF/RVR, HR in 100s-120s.  This likely contributes to CHF exacerbation. Has not been in anticoagulation due to history of recurrent GI bleeding.   - Transition to amiodarone gtt for better rate control.  - Continue apixaban 2.5 mg bid, starting today.   - Hopefully will get to TEE-guided DCCV next week.  Suspect she will be able to tolerate apixaban for at least a month or so post-DCCV.  4. AKI on CKD stage IIIb: Creatinine up to 3 with poor diuresis.  Suspect cardiorenal syndrome.   - Nephrology following.  5. H/o TAVR: Bioprosthetic AoV stable on 3/22 echo.  6. COPD, OHS: On home oxygen at baseline.   Think overall prognosis here is poor.  Agree with palliative care evaluation for goals of care.   [name] 01/14/2021 5:48 PM

## 2021-01-18 NOTE — Progress Notes (Signed)
Electrophysiology Rounding Note  Patient Name: Patricia [name] Date of Encounter: 01/17/2021  Primary Cardiologist: [name] Martinique, MD Electrophysiologist: new to Dr. Quentin Ore   Subjective   Feeling about the same. Breathing is ok at rest. Chronic orthopnea.  No new symptoms  Inpatient Medications    Scheduled Meds: . amiodarone  200 mg Oral Daily  . doxazosin  4 mg Oral Daily  . fluticasone furoate-vilanterol  1 puff Inhalation Daily   And  . umeclidinium bromide  1 puff Inhalation Daily  . heparin  5,000 Units Subcutaneous Q8H  . insulin aspart  0-5 Units Subcutaneous QHS  . insulin aspart  0-9 Units Subcutaneous TID WC  . levothyroxine  88 mcg Oral Q0600  . pantoprazole  40 mg Oral BID  . QUEtiapine  12.5 mg Oral QHS  . rosuvastatin  10 mg Oral Daily  . sodium chloride flush  3 mL Intravenous Q12H   Continuous Infusions:  PRN Meds: acetaminophen **OR** acetaminophen, albuterol, azelastine, dextromethorphan-guaiFENesin, methocarbamol, Muscle Rub, ondansetron **OR** ondansetron (ZOFRAN) IV   Vital Signs    Vitals:   01/17/21 1323 01/17/21 1952 01/04/2021 0030 01/05/2021 0437  BP: (!) 87/56 (!) 103/39 123/66 (!) 99/52  Pulse: (!) 110 (!) 114 (!) 105 (!) 113  Resp: _0 Temp: 98 F (36.7 C) 97.6 F (36.4 C) 97.8 F (36.6 C) 97.7 F (36.5 C)  TempSrc: Oral Oral Oral Oral  SpO2: 99% 98% 98% 99%  Weight:   97.9 kg   Height:        Intake/Output Summary (Last 24 hours) at 01/11/2021 0721 Last data filed at 01/21/2021 0440 Gross per 24 hour  Intake 490 ml  Output 500 ml  Net -10 ml   Filed Weights   01/16/21 1506 01/17/21 0605 01/11/2021 0030  Weight: 102.9 kg 98.8 kg 97.9 kg    Physical Exam    GEN- The patient is well appearing, alert and oriented x 3 today.   Head- normocephalic, atraumatic Eyes-  Sclera clear, conjunctiva pink Ears- hearing intact Oropharynx- clear Neck- supple Lungs- Clear to ausculation bilaterally, normal work of  breathing Heart- Irregularly irregular rate and rhythm, no murmurs, rubs or gallops GI- soft, NT, ND, + BS Extremities- no clubbing or cyanosis. No edema Skin- no rash or lesion Psych- euthymic mood, full affect Neuro- strength and sensation are intact  Labs    CBC Recent Labs    01/05/2021 1904 01/16/21 0437 01/17/21 0318  WBC 5.8 6.0 5.6  NEUTROABS 5.3  --  4.2  HGB 10.2* 9.4* 9.4*  HCT 34.2* 31.6* 29.9*  MCV 95.3 97.2 94.0  PLT 150 138* 944*   Basic Metabolic Panel Recent Labs    01/17/21 0318 12/28/2020 0403  NA 137 136  K 4.4 4.0  CL 96* 93*  CO2 33* 32  GLUCOSE 143* 157*  BUN 73* 81*  CREATININE 2.81* 3.00*  CALCIUM 8.9 8.5*  MG 1.9 2.0   Liver Function Tests Recent Labs    01/10/2021 1904 01/16/21 1656  AST 29 14*  ALT 18 13  ALKPHOS 55 50  BILITOT 0.7 0.6  PROT 7.1 6.4*  ALBUMIN 3.8 3.4*   No results for input(s): LIPASE, AMYLASE in the last 72 hours. Cardiac Enzymes No results for input(s): CKTOTAL, CKMB, CKMBINDEX, TROPONINI in the last 72 hours.   Telemetry    AF 100-110s (personally reviewed)  Radiology    DG CHEST PORT 1 VIEW  Result Date: 01/17/2021 CLINICAL DATA:  Shortness of  breath EXAM: PORTABLE CHEST 1 VIEW COMPARISON:  12/29/2020 FINDINGS: Stable cardiomegaly. Atherosclerotic calcification of the aortic knob. Small bilateral pleural effusions, right greater than left. Associated bibasilar opacities. Diffuse interstitial prominence. No pneumothorax. IMPRESSION: Cardiomegaly with small bilateral pleural effusions and mild interstitial edema, suggesting CHF with mild pulmonary edema. No appreciable interval change from prior. Electronically Signed   By: Davina Poke D.O.   On: 01/17/2021 13:14   ECHOCARDIOGRAM COMPLETE  Result Date: 01/16/2021    ECHOCARDIOGRAM REPORT   Patient Name:   Patricia [name] Date of Exam: 01/16/2021 Medical Rec #:  [id number]      Height:       63.0 in Accession #:    [id number]     Weight:       209.0 lb Date  of Birth:  November 02, 1935     BSA:          1.970 m Patient Age:    59 years       BP:           209/63 mmHg Patient Gender: F              HR:           107 bpm. Exam Location:  Inpatient Procedure: 2D Echo Indications:    B93.90 Acute systolic (congestive) heart failure  History:        Patient has prior history of Echocardiogram examinations, most                 recent 11/07/2019. CHF.  Sonographer:    Luisa Hart RDCS Referring Phys: [id number] [place]  1. Left ventricular ejection fraction, by estimation, is 25 to 30%. The left ventricle has severely decreased function. The left ventricle demonstrates regional wall motion abnormalities) Abnormal (paradoxical) septal motion, consistent with left bundle  branch block. There is severe left ventricular hypertrophy. Left ventricular diastolic parameters are indeterminate.  2. Right ventricular systolic function is moderately reduced. The right ventricular size is normal. There is moderately elevated pulmonary artery systolic pressure. The estimated right ventricular systolic pressure is 00.7 mmHg.  3. Left atrial size was severely dilated.  4. Right atrial size was moderately dilated.  5. The mitral valve is abnormal. Mild to moderate mitral valve regurgitation. Severe mitral annular calcification.  6. There is a 23 mm Edwards Sapien prosthetic, stented (TAVR) valve present in the aortic position. Aortic valve regurgitation is not visualized. Aortic valve mean gradient measures 12.0 mmHg, which is reduced from prior echo 11/07/19, likely due to decreased systolic function. Vmax 2.1 m/s, MG 12 mmHg, EOA 1.1 cm^2, DI 0.44  7. The inferior vena cava is dilated in size with <50% respiratory variability, suggesting right atrial pressure of 15 mmHg. FINDINGS  Left Ventricle: Left ventricular ejection fraction, by estimation, is 25 to 30%. The left ventricle has severely decreased function. The left ventricle demonstrates regional wall motion abnormalities. 3D  left ventricular ejection fraction analysis performed but not reported based on interpreter judgement due to suboptimal quality. The left ventricular internal cavity size was normal in size. There is severe left ventricular hypertrophy. Abnormal (paradoxical) septal motion, consistent with left bundle branch block. Left ventricular diastolic parameters are indeterminate. Right Ventricle: The right ventricular size is normal. Right vetricular wall thickness was not well visualized. Right ventricular systolic function is moderately reduced. There is moderately elevated pulmonary artery systolic pressure. The tricuspid regurgitant velocity is 2.89 m/s, and with an assumed right atrial pressure of 15 mmHg, the  estimated right ventricular systolic pressure is 68.1 mmHg. Left Atrium: Left atrial size was severely dilated. Right Atrium: Right atrial size was moderately dilated. Pericardium: There is no evidence of pericardial effusion. Mitral Valve: The mitral valve is abnormal. Severe mitral annular calcification. Mild to moderate mitral valve regurgitation. Tricuspid Valve: The tricuspid valve is normal in structure. Tricuspid valve regurgitation is mild. Aortic Valve: The aortic valve has been repaired/replaced. Aortic valve regurgitation is not visualized. Aortic valve mean gradient measures 12.0 mmHg. Aortic valve peak gradient measures 17.1 mmHg. Aortic valve area, by VTI measures 1.13 cm. There is a  23 mm Edwards Sapien prosthetic, stented (TAVR) valve present in the aortic position. Pulmonic Valve: The pulmonic valve was not well visualized. Pulmonic valve regurgitation is not visualized. Aorta: The aortic root and ascending aorta are structurally normal, with no evidence of dilitation. Venous: The inferior vena cava is dilated in size with less than 50% respiratory variability, suggesting right atrial pressure of 15 mmHg. IAS/Shunts: The interatrial septum was not well visualized.  LEFT VENTRICLE PLAX 2D LVIDd:          3.90 cm LVIDs:         3.40 cm LV PW:         1.80 cm LV IVS:        2.00 cm LVOT diam:     1.80 cm LV SV:         37 LV SV Index:   19 LVOT Area:     2.54 cm  LV Volumes (MOD) LV vol d, MOD A2C: 119.0 ml LV vol d, MOD A4C: 74.5 ml LV vol s, MOD A2C: 74.1 ml LV vol s, MOD A4C: 67.8 ml LV SV MOD A2C:     44.9 ml LV SV MOD A4C:     74.5 ml LV SV MOD BP:      27.3 ml RIGHT VENTRICLE TAPSE (M-mode): 0.5 cm LEFT ATRIUM              Index        RIGHT ATRIUM           Index LA Vol (A2C):   202.0 ml 102.53 ml/m RA Area:     26.20 cm LA Vol (A4C):   139.0 ml 70.55 ml/m  RA Volume:   87.20 ml  44.26 ml/m LA Biplane Vol: 180.0 ml 91.36 ml/m  AORTIC VALVE                    PULMONIC VALVE AV Area (Vmax):    1.08 cm     PV Vmax:       0.99 m/s AV Area (Vmean):   1.00 cm     PV Vmean:      59.600 cm/s AV Area (VTI):     1.13 cm     PV VTI:        0.172 m AV Vmax:           207.00 cm/s  PV Peak grad:  3.9 mmHg AV Vmean:          156.500 cm/s PV Mean grad:  2.0 mmHg AV VTI:            0.329 m AV Peak Grad:      17.1 mmHg AV Mean Grad:      12.0 mmHg LVOT Vmax:         87.60 cm/s LVOT Vmean:        61.500 cm/s LVOT VTI:  0.146 m LVOT/AV VTI ratio: 0.44  AORTA Ao Asc diam: 2.50 cm MITRAL VALVE                 TRICUSPID VALVE MV Area (PHT): 6.27 cm      TR Peak grad:   33.4 mmHg MV Decel Time: 121 msec      TR Vmax:        289.00 cm/s MR Peak grad:    85.4 mmHg MR Mean grad:    54.0 mmHg   SHUNTS MR Vmax:         462.00 cm/s Systemic VTI:  0.15 m MR Vmean:        350.0 cm/s  Systemic Diam: 1.80 cm MR PISA:         3.08 cm MR PISA Eff ROA: 14 mm MR PISA Radius:  0.70 cm MV E velocity: 181.00 cm/s Oswaldo Milian MD Electronically signed by Oswaldo Milian MD Signature Date/Time: 01/16/2021/4:16:28 PM    Final     Patient Profile     Patricia [name] is a 85 y.o. female with a history of paroxysmal atrial fibrillation with known LBBB >> not on anticoagulation due to recurrent GIB, HTN, HLD, AS s/p  TAVR 2017, chronic combined systolic and diastolic CHF, CKD IV, anemia, obesity, chronic respiratory failure on 02, and DM2 who is being seen today for the evaluation of AF with OAC intolerance and HF at the request of Dr. Margaretann Loveless.  Assessment & Plan    1. Paroxysmal, now Persistent atrial fibrillation She is not on [place] despite CHA2DS2VASC of at least 6 in setting of chronic anemia and h/o GI bleeds due to AVMs Rates relatively stable currently, 100-110s mostly She has been maintained on po amiodarone chronically.   She appears to tolerate AF very poorly.  Would recommend rechallenging with Eliquis and TEE/DCC.  Rate control has also been limited by hypotension at times Not candidate for ablation based on poor tolerance of general anasthesia, COPD, and severe atriopathy. She would also be a very poor candidate for Watchman after further review of her issues (severe chronic COPD and inability to lie flat)  2. Acute on chronic systolic CHF,  LHC 73/5329 with single vessel obstructive CAD involving OM2, otherwise non obstructive. Mod/Severe AS (Pre TAVR) EF down to 25-30%.  GDMT limited by CKD IV and hypotension at times. In setting of wide LBBB, she would likely benefit from CRT-P, but will need to convalesce from her current exacerbation, and again, be able to lie flat for the procedure. This would take place as an outpatient.   3. CAD H/o single vessel disease as above.  Denies s/s of ischemia.  4. AKI on CKD IV Baseline Cr appears 1.8 - 2.0 Cr up to 3.0 this admission. Per primary.   5. Chronic hypoxic respiratory failure on O2 Pulmonary has also been consulted to help determine if pulmonary component to her worsening SOB.  The worry is that her lung function is very poor and will not improve as she is unable to tolerate CPAP/BiPAP.   6. S/p TAVR Stable by echo 01/16/21.  7. Goals of Care As per pulmonary note, pt has renal, pulmonary, and cardiac failure of a significant and  somewhat chronic degree. Would recommend goals of care discussions as well.   As above, she is generally a poor candidate for any EP procedures. CRT-P may be considered if she improves significantly from this admission and is able to lie mostly flat for an extended period  of time.   For questions or updates, please contact [place] Please consult [email or web address] for contact info under Cardiology/STEMI.  Signed, Shirley Friar, PA-C  12/30/2020, 7:21 AM

## 2021-01-18 NOTE — Interval H&P Note (Signed)
History and Physical Interval Note:  01/10/2021 2:51 PM  Patricia [name]  has presented today for surgery, with the diagnosis of heart failure.  The various methods of treatment have been discussed with the patient and family. After consideration of risks, benefits and other options for treatment, the patient has consented to  Procedure(s): RIGHT HEART CATH (N/A) as a surgical intervention.  The patient's history has been reviewed, patient examined, no change in status, stable for surgery.  I have reviewed the patient's chart and labs.  Questions were answered to the patient's satisfaction.     Glenetta Hew

## 2021-01-18 NOTE — Consult Note (Signed)
Nephrology Consult   Requesting provider: Dr. Remi Haggard Service requesting consult: [facility] Reason for consult: AKI    Assessment/Recommendations: Patricia [name] is a/an 85 y.o. female with a past medical history notable for AKI    1. Non-Oliguric/Anuric AKI: Likely multi-factorial, in the setting of decompensated HFrEF with newly further reduced EF placing her at high risk of cardiorenal syndrome, A. Fib with RVR and hypotension. On examination, she does appear quite hypervolemic, suspect patient will need high doses of Lasix for appropriate response. Extent of diuresis can be guided by RHC planned for this afternoon.   -Agree with Lasix 160 mg, frequency of dosing pending results of RHC -Renal ultrasound to rule out obstruction given poor UOP -Consider foley catheter for strict UOP monitoring  -Continue to monitor daily Cr, Dose meds for GFR<15 -Monitor Daily I/Os, Daily weight  -Maintain MAP>65 for optimal renal perfusion.  -Avoid further nephrotoxins including NSAIDS, Morphine. Unless absolutely necessary, avoid CT with contrast and/or MRI with gadolinium.    -Currently no indication for HD. However, not a good candidate long term HD.   2. CKD Stage 4: Decreased GFR dating back to 2016. Lab values over the past 3 years are sporadic, however baseline seems to be between 1.6 - 2.0. Some proteinuria noted as far back as 2014, with most recent UACR being 50 in 2018. There likely is a component of diabetic kidney disease and arterionephrosclerosis.   -Nephrology follow up on discharge -Will start urine studies with UA. Hold off on UPCR and UACR for now -PTH, Calcium, Phosphorus to evaluate for mineral bone disease  3. Acute Decompensated Biventricular Failure:  -Hx dating back to 2020 with TTE during this admission showing further decline.  -Diuresis has been difficult due to AKI and poor UOP.  -GDMT is being held due to hypotension.  -Cardiology following -Ruby planned for  today  4. Acute on Chronic Hypoxic and Hypercapnic Respiratory Failure, OHA, COPD:  -In the setting of acute pulmonary edema with poor pulmonary reserve.  -Home O2 requirement of 2L. -Pulmonology following -Supplemental O2 to maintain saturation > 88%  5. Hypotension, Hx of HTN:  -Possibly in the setting of poor cardiac output  -Hold anti-hypertensives to maintain MAP > 65  6. Type 2 Diabetes Mellitus:  -Long history of T2DM. Review of chart demonstrates relatively well controlled over the past 7 years.  -Home medications include glimepiride, Januvia -SSI per primary   7. A. Fib with RVR:  -Amiodarone 200 mg daily -Eliquis for anti-coagulation  8. Anemia of Renal Disease, Iron Deficiency Anemia:  -Iron Panel -Transfuse for Hgb<7 g/dL -No role for ESA in setting of AKI  9. Goals of Care:  -Acute on chronic multi-organ failure.  -Palliative consultation pending.   Dr. Jose Persia Internal Medicine PGY-2   01/17/2021, 11:46 AM __________________________________________________________________________________  History of Present Illness: Patricia [name] is a/an 85 y.o. female with a past medical history of CKD stage IV, HFrEF 2/2 ischemic cardiomyopathy, aortic stenosis s/p TAVR, chronic hypoxic and hypercapnic respiratory failure on home oxygen, COPD, OHS, type 2 diabetes, hypertension, who presents to [facility] with SOB, orthopnea and peripheral edema.  Ms. Patricia [name] states that for several days prior to presentation, she started developing worsening shortness of breath with exertion and at rest; endorsed orthopnea.  She had to increase her home oxygen from 2 L to 4 L.  She also noted worsening lower extremity edema although denies any edema in her stomach or arms.  She endorses feeling palpitations for the  last day or so.  She denies any chest pain.  She endorses good urine output without any dysuria, hematuria.  Per chart review, patient has a history of chronic kidney disease dating  back to at least 2016.  Lab work is sporadic over the years and primarily available during hospitalizations.  Most recent baseline between 1.6-2.0 with intermittent elevations as high as 2.2.  She has a history of proteinuria on urinalysis dating back to 2013.  She had a urine microalbumin creatinine ratio in 2014 that was 140 with improvement to 50 2018.  No other urine studies available.  She has never seen a nephrologist.  She denies any family history of kidney disease.  Denies personal history of nephrolithiasis.  Medications:  Current Facility-Administered Medications  Medication Dose Route Frequency Provider Last Rate Last Admin  . acetaminophen (TYLENOL) tablet 650 mg  650 mg Oral Q6H PRN Lenore Cordia, MD   650 mg at 01/17/21 2056   Or  . acetaminophen (TYLENOL) suppository 650 mg  650 mg Rectal Q6H PRN Zada Finders R, MD      . albuterol (PROVENTIL) (2.5 MG/3ML) 0.083% nebulizer solution 2.5 mg  2.5 mg Nebulization Q2H PRN Alekh, Kshitiz, MD      . amiodarone (PACERONE) tablet 200 mg  200 mg Oral Daily Zada Finders R, MD   200 mg at 01/22/2021 0939  . azelastine (ASTELIN) 0.1 % nasal spray 1 spray  1 spray Each Nare BID PRN Aline August, MD      . dextromethorphan-guaiFENesin (MUCINEX DM) 30-600 MG per 12 hr tablet 2 tablet  2 tablet Oral BID PRN Aline August, MD   2 tablet at 01/17/21 0443  . doxazosin (CARDURA) tablet 4 mg  4 mg Oral Daily Lenore Cordia, MD   4 mg at 01/08/2021 1324  . fluticasone furoate-vilanterol (BREO ELLIPTA) 200-25 MCG/INH 1 puff  1 puff Inhalation Daily Alekh, Kshitiz, MD       And  . umeclidinium bromide (INCRUSE ELLIPTA) 62.5 MCG/INH 1 puff  1 puff Inhalation Daily Starla Link, Kshitiz, MD   1 puff at 01/17/21 0936  . heparin injection 5,000 Units  5,000 Units Subcutaneous Q8H Lenore Cordia, MD   5,000 Units at 01/16/21 0123  . insulin aspart (novoLOG) injection 0-5 Units  0-5 Units Subcutaneous QHS Patel, Vishal R, MD      . insulin aspart (novoLOG)  injection 0-9 Units  0-9 Units Subcutaneous TID WC Lenore Cordia, MD   1 Units at 01/17/21 1752  . levothyroxine (SYNTHROID) tablet 88 mcg  88 mcg Oral Q0600 Lenore Cordia, MD   88 mcg at 01/22/2021 0656  . methocarbamol (ROBAXIN) tablet 500 mg  500 mg Oral Q6H PRN Aline August, MD   500 mg at 01/16/21 0920  . Muscle Rub CREA   Topical PRN Opyd, Ilene Qua, MD      . ondansetron (ZOFRAN) tablet 4 mg  4 mg Oral Q6H PRN Lenore Cordia, MD       Or  . ondansetron (ZOFRAN) injection 4 mg  4 mg Intravenous Q6H PRN Zada Finders R, MD      . pantoprazole (PROTONIX) EC tablet 40 mg  40 mg Oral BID Lenore Cordia, MD   40 mg at 01/17/2021 0939  . QUEtiapine (SEROQUEL) tablet 12.5 mg  12.5 mg Oral QHS Noemi Chapel P, DO   12.5 mg at 01/17/21 2056  . rosuvastatin (CRESTOR) tablet 10 mg  10 mg Oral Daily Patel, Vishal R,  MD   10 mg at 01/09/2021 0939  . sodium chloride flush (NS) 0.9 % injection 3 mL  3 mL Intravenous Q12H Zada Finders R, MD   3 mL at 01/17/21 2057    ALLERGIES Codeine and Vesicare [solifenacin]  MEDICAL HISTORY Past Medical History:  Diagnosis Date  . Allergy   . Anemia, unspecified   . Arthritis   . Asthma   . Atrial fibrillation status post cardioversion [facility]) 09/2015   s/p TEE/DCCV>>SR on amio  . Benign neoplasm of colon   . Carpal tunnel syndrome   . Cellulitis and abscess of finger, unspecified   . Cerumen impaction   . Cervicalgia   . CHF (congestive heart failure) ([place])   . Chronic airway obstruction, not elsewhere classified   . Chronic anticoagulation 09/2015   Eliquis  . Diarrhea   . Diffuse cystic mastopathy   . Edema   . Encounter for long-term (current) use of other medications   . GERD (gastroesophageal reflux disease)   . Heart murmur   . Lumbago   . Neck pain 05/23/2015  . Obesity, unspecified   . Other and unspecified hyperlipidemia   . Other psoriasis   . Pain in joint, lower leg   . PONV (postoperative nausea and vomiting)   . Primary localized  osteoarthrosis of right shoulder 12/18/2015  . 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 Scoliosis (and kyphoscoliosis), idiopathic   . Shortness of breath dyspnea    with exertion  . Type II or unspecified type diabetes mellitus without mention of complication, uncontrolled   . Unspecified essential hypertension   . Unspecified hemorrhoids without mention of complication   . Unspecified hypothyroidism   . Urge incontinence     SOCIAL HISTORY Social History   Socioeconomic History  . Marital status: Divorced    Spouse name: Not on file  . Number of children: 1  . Years of education: Not on file  . Highest education level: Not on file  Occupational History  . Occupation: Retired Market researcher for [employer]  Tobacco Use  . Smoking status: Former Smoker    Packs/day: 1.00    Years: 40.00    Pack years: 40.00    Quit date: 09/07/1989    Years since quitting: 31.3  . Smokeless tobacco: Never Used  Vaping Use  . Vaping Use: Never used  Substance and Sexual Activity  . Alcohol use: No    Alcohol/week: 0.0 standard drinks  . Drug use: No  . Sexual activity: Never  Other Topics Concern  . Not on file  Social History Narrative   Her daughter & 3 grandchildren live in the area.     Social Determinants of Health   Financial Resource Strain: Not on file  Food Insecurity: Not on file  Transportation Needs: Not on file  Physical Activity: Not on file  Stress: Not on file  Social Connections: Not on file  Intimate Partner Violence: Not on file    FAMILY HISTORY Family History  Problem Relation Age of Onset  . Diabetes Father   . Stroke Father   . Heart disease Father   . Liver cancer Sister   . Heart disease Brother   . Asthma Sister   . Arthritis Sister   . Breast cancer Neg Hx     No family history of kidney disease  Review of  Systems: 12 systems  reviewed Otherwise as per HPI, all other systems reviewed and negative  Physical Exam: Vitals:   12/25/2020 0739 12/27/2020 1038  BP: 106/69   Pulse: (!) 110   Resp: 19   Temp: 97.8 F (36.6 C)   SpO2: 100% 97%   No intake/output data recorded.  Intake/Output Summary (Last 24 hours) at 12/25/2020 1145 Last data filed at 01/20/2021 0440 Gross per 24 hour  Intake 490 ml  Output 500 ml  Net -10 ml   General: Obese, chronically ill-appearing pleasant female, no acute distress HEENT: anicteric sclera, oropharynx clear without lesions CV: Irregularly irregular, tachycardic, no murmurs, no gallops, no rubs, +1 pitting edema bilateral lower extremities Lungs: Decreased breath sounds in the bases bilaterally with rales extending to the upper lung fields bilaterally, no respiratory distress Abd: soft, non-tender, non-distended Skin: no visible lesions or rashes Psych: alert, engaged, appropriate mood and affect Musculoskeletal: no obvious deformities Neuro: normal speech, no gross focal deficits   Test Results Reviewed Lab Results  Component Value Date   NA 136 01/22/2021   K 4.0 01/20/2021   CL 93 (L) 01/10/2021   CO2 32 01/07/2021   BUN 81 (H) 01/05/2021   CREATININE 3.00 (H) 12/29/2020   GFR 30.22 (L) 05/23/2020   CALCIUM 8.5 (L) 12/31/2020   ALBUMIN 3.4 (L) 01/16/2021   I have reviewed all relevant outside healthcare records related to the patient's kidney injury.

## 2021-01-19 DIAGNOSIS — N1832 Chronic kidney disease, stage 3b: Secondary | ICD-10-CM | POA: Diagnosis not present

## 2021-01-19 DIAGNOSIS — I5023 Acute on chronic systolic (congestive) heart failure: Secondary | ICD-10-CM | POA: Diagnosis not present

## 2021-01-19 DIAGNOSIS — N179 Acute kidney failure, unspecified: Secondary | ICD-10-CM | POA: Diagnosis not present

## 2021-01-19 DIAGNOSIS — J9621 Acute and chronic respiratory failure with hypoxia: Secondary | ICD-10-CM | POA: Diagnosis not present

## 2021-01-19 DIAGNOSIS — I480 Paroxysmal atrial fibrillation: Secondary | ICD-10-CM | POA: Diagnosis not present

## 2021-01-19 LAB — GLUCOSE, CAPILLARY
Glucose-Capillary: 177 mg/dL — ABNORMAL HIGH (ref 70–99)
Glucose-Capillary: 139 mg/dL — ABNORMAL HIGH (ref 70–99)
Glucose-Capillary: 204 mg/dL — ABNORMAL HIGH (ref 70–99)
Glucose-Capillary: 130 mg/dL — ABNORMAL HIGH (ref 70–99)

## 2021-01-19 LAB — CBC
HCT: 31.8 % — ABNORMAL LOW (ref 36.0–46.0)
Hemoglobin: 9.5 g/dL — ABNORMAL LOW (ref 12.0–15.0)
MCH: 28.4 pg (ref 26.0–34.0)
MCHC: 29.9 g/dL — ABNORMAL LOW (ref 30.0–36.0)
MCV: 94.9 fL (ref 80.0–100.0)
Platelets: 126 10*3/uL — ABNORMAL LOW (ref 150–400)
RBC: 3.35 MIL/uL — ABNORMAL LOW (ref 3.87–5.11)
RDW: 15.8 % — ABNORMAL HIGH (ref 11.5–15.5)
WBC: 5.5 10*3/uL (ref 4.0–10.5)
nRBC: 0 % (ref 0.0–0.2)

## 2021-01-19 LAB — RENAL FUNCTION PANEL
Albumin: 3.3 g/dL — ABNORMAL LOW (ref 3.5–5.0)
Anion gap: 9 (ref 5–15)
BUN: 88 mg/dL — ABNORMAL HIGH (ref 8–23)
CO2: 33 mmol/L — ABNORMAL HIGH (ref 22–32)
Calcium: 8.5 mg/dL — ABNORMAL LOW (ref 8.9–10.3)
Chloride: 94 mmol/L — ABNORMAL LOW (ref 98–111)
Creatinine, Ser: 3.14 mg/dL — ABNORMAL HIGH (ref 0.44–1.00)
GFR, Estimated: 14 mL/min — ABNORMAL LOW (ref >60)
Glucose, Bld: 183 mg/dL — ABNORMAL HIGH (ref 70–99)
Phosphorus: 6.6 mg/dL — ABNORMAL HIGH (ref 2.5–4.6)
Potassium: 3.8 mmol/L (ref 3.5–5.1)
Sodium: 136 mmol/L (ref 135–145)

## 2021-01-19 LAB — IRON AND TIBC
Iron: 34 ug/dL (ref 28–170)
Saturation Ratios: 12 % (ref 10.4–31.8)
TIBC: 281 ug/dL (ref 250–450)
UIBC: 247 ug/dL

## 2021-01-19 LAB — FERRITIN: Ferritin: 397 ng/mL — ABNORMAL HIGH (ref 11–307)

## 2021-01-19 LAB — MAGNESIUM: Magnesium: 1.9 mg/dL (ref 1.7–2.4)

## 2021-01-19 MED ORDER — MIDODRINE HCL 5 MG PO TABS
5.0000 mg | ORAL_TABLET | Freq: Three times a day (TID) | ORAL | Status: DC
  Administered 2021-01-19 – 2021-01-20 (×3): 5 mg via ORAL
  Filled 2021-01-19 (×4): qty 1

## 2021-01-19 MED ORDER — METOLAZONE 5 MG PO TABS
5.0000 mg | ORAL_TABLET | Freq: Two times a day (BID) | ORAL | Status: DC
  Administered 2021-01-19 – 2021-01-21 (×5): 5 mg via ORAL
  Filled 2021-01-19 (×5): qty 1

## 2021-01-19 MED ORDER — SODIUM CHLORIDE 0.9% FLUSH: 3.0000 mL | INTRAVENOUS | Status: DC | PRN

## 2021-01-19 MED ORDER — SODIUM CHLORIDE 0.9% FLUSH
3.0000 mL | Freq: Two times a day (BID) | INTRAVENOUS | Status: DC
  Administered 2021-01-19 – 2021-01-26 (×8): 3 mL via INTRAVENOUS

## 2021-01-19 MED ORDER — ONDANSETRON HCL 4 MG/2ML IJ SOLN
4.0000 mg | Freq: Four times a day (QID) | INTRAMUSCULAR | Status: DC | PRN
Start: 2021-01-19 — End: 2021-01-19

## 2021-01-19 MED ORDER — ALPRAZOLAM 0.25 MG PO TABS
0.2500 mg | ORAL_TABLET | Freq: Three times a day (TID) | ORAL | Status: DC | PRN
  Administered 2021-01-19 – 2021-01-22 (×5): 0.25 mg via ORAL
  Filled 2021-01-19 (×6): qty 1

## 2021-01-19 MED ORDER — LABETALOL HCL 5 MG/ML IV SOLN: 10.0000 mg | INTRAVENOUS | Status: AC | PRN

## 2021-01-19 MED ORDER — SODIUM CHLORIDE 0.9 % IV SOLN: 250.0000 mL | INTRAVENOUS | Status: DC | PRN

## 2021-01-19 MED ORDER — HYDRALAZINE HCL 20 MG/ML IJ SOLN: 10.0000 mg | INTRAMUSCULAR | Status: AC | PRN

## 2021-01-19 NOTE — Progress Notes (Signed)
[facility] Progress Note    Assessment/ Plan:   AKI on CKD 4 -Baseline creatinine seems to range around 1.6-2 with underlying proteinuria.  Suspect is a component of diabetic kidney disease and hypertensive arterionephrosclerosis -AKI likely related to cardiorenal syndrome -On milrinone and Lasix drip -May require CRRT soon if there is no adequate response in regards to her volume and urine output.  I am concerned about her long-term candidacy for hemodialysis given her multiple comorbidities especially with her HFrEF secondary to ischemic cardiomyopathy and her chronic respiratory failure -Avoid nephrotoxic medications including NSAIDs and iodinated intravenous contrast exposure unless the latter is absolutely indicated.  Preferred narcotic agents for pain control are hydromorphone, fentanyl, and methadone. Morphine should not be used. Avoid Baclofen and avoid oral sodium phosphate and magnesium citrate based laxatives / bowel preps. Continue strict Input and Output monitoring. Will monitor the patient closely with you and intervene or adjust therapy as indicated by changes in clinical status/labs   Acute decompensated biventricular failure -Cardio on board, status post RHC 01/19/2021.  On Lasix and milrinone drips -Palliative on board  Afib w/ RVR -on amio gtt  Acute on chronic hypoxic and hypercapnic respiratory failure, history of OHS, COPD -Also with acute pulmonary edema -Pulmonology on board  Hypotension, history of hypertension -Likely secondary to poor cardiac output  DM2 with hyperglycemia -Management per primary service  Anemia of chronic disease and chronic IDA -monitor hgb for now (stable), transfuse for hemoglobin less than 7  Subjective:   No acute events, on milrinone and lasix gtt 55m/hr, uop ~0.7L   Objective:   BP (!) 146/89 (BP Location: Right Arm)   Pulse (!) 103   Temp (!) 97.3 F (36.3 C) (Oral)   Resp 16   Ht _0  (1.575 m)   Wt  98.8 kg   SpO2 100%   BMI 39.84 kg/m   Intake/Output Summary (Last 24 hours) at 01/19/2021 1316 Last data filed at 01/19/2021 1252 Gross per 24 hour  Intake 1616.59 ml  Output 700 ml  Net 916.59 ml   Weight change: 0.9 kg  Physical Exam: Gen:nad, sitting up in chair CVS: Tachycardic, irregularly irregular Resp: CTA BL Abd: Obese, soft, nontender Ext: Plus pitting edema bilateral lower extremities Neuro: Speech and coherent, moves all extremities spontaneously  Imaging: CARDIAC CATHETERIZATION  Result Date: 12/28/2020  Hemodynamic findings consistent with severe pulmonary hypertension. Mean PAP 46-48 mmHg  Moderately reduced cardiac output of index with PA sat of 50%; CO-CI of 4.37-2.21  Patient still remains significant volume overloaded with elevated PA pressures.  Unfortunately her anatomy did not allow [place] complete the full right heart catheterization with wedge pressure.  If absolutely indicated in the future, would consider left brachial versus IJ access.  However the patient did not seem to be interested in having another procedure. DGlenetta Hew MD  [place]  Result Date: 01/03/2021 CLINICAL DATA:  Acute kidney injury. EXAM: RENAL / URINARY TRACT ULTRASOUND COMPLETE COMPARISON:  Abdominal ultrasound 10/23/2020, abdominal CT 04/17/2020 FINDINGS: Right Kidney: Renal measurements: 12.2 x 5.7 x 6.1 cm = volume: 220 mL. No hydronephrosis. Mild renal parenchymal thinning and increased echogenicity. Trace perinephric fluid about the lower pole. Hypoechoic lesion with low level internal echoes in the mid kidney measures 1.7 x 1.5 x 1.5 cm. This is unchanged in size from prior exam. Left Kidney: Renal measurements: 11.4 x 6.2 x 5.6 cm = volume: 206 mL. No hydronephrosis. Mild renal parenchymal thinning and increased parenchymal echogenicity. No focal  lesion or evidence of stone. Bladder: Appears normal for degree of bladder distention. Other: None. IMPRESSION: 1. No obstructive  uropathy. 2. Mild thinning of the renal parenchyma with increased echogenicity consistent with chronic medical renal disease. 3. Trace right perinephric fluid about the inferior pole. 4. Hypoechoic lesion in the right kidney is unchanged in size from prior exam measuring 1.7 cm, likely a complex/hemorrhagic cyst. Electronically Signed   By: Keith Rake M.D.   On: 12/31/2020 23:27    Labs: BMET Recent Labs  Lab 01/22/2021 1904 12/27/2020 2104 01/16/21 0437 01/17/21 0318 01/12/2021 0403 12/28/2020 1543 01/19/21 0223  NA 137  --  137 137 136 139  139 136  K 5.7* 5.0 4.6 4.4 4.0 3.8  3.9 3.8  CL 97*  --  97* 96* 93*  --  94*  CO2 31  --  31 33* 32  --  33*  GLUCOSE 223*  --  190* 143* 157*  --  183*  BUN 60*  --  63* 73* 81*  --  88*  CREATININE 2.18*  --  2.16* 2.81* 3.00*  --  3.14*  CALCIUM 9.4  --  9.2 8.9 8.5*  --  8.5*  PHOS  --   --   --   --   --   --  6.6*   CBC Recent Labs  Lab 12/29/2020 1904 01/16/21 0437 01/17/21 0318 01/04/2021 1543 01/19/21 0928  WBC 5.8 6.0 5.6  --  5.5  NEUTROABS 5.3  --  4.2  --   --   HGB 10.2* 9.4* 9.4* 9.9*  10.2* 9.5*  HCT 34.2* 31.6* 29.9* 29.0*  30.0* 31.8*  MCV 95.3 97.2 94.0  --  94.9  PLT 150 138* 133*  --  126*    Medications:    . apixaban  2.5 mg Oral BID  . fluticasone furoate-vilanterol  1 puff Inhalation Daily   And  . umeclidinium bromide  1 puff Inhalation Daily  . insulin aspart  0-5 Units Subcutaneous QHS  . insulin aspart  0-9 Units Subcutaneous TID WC  . levothyroxine  88 mcg Oral Q0600  . metolazone  5 mg Oral BID  . midodrine  5 mg Oral TID WC  . pantoprazole  40 mg Oral BID  . QUEtiapine  12.5 mg Oral QHS  . rosuvastatin  10 mg Oral Daily  . sodium chloride flush  3 mL Intravenous Q12H  . sodium chloride flush  3 mL Intravenous Q12H      Gean Quint, MD [facility] 01/19/2021, 1:16 PM

## 2021-01-19 NOTE — Progress Notes (Signed)
PT Cancellation Note  Patient Details Name: Patricia [name] MRN: [id number] DOB: [birth year]   Cancelled Treatment:    Reason Eval/Treat Not Completed: Fatigue/lethargy limiting ability to participate;Patient declined, no reason specified. Pt declines PT session at this time, reporting she does not feel up to it today. PT attempts to encourage and provide education on the importance of mobility however pt continues to refuse. Pt also refuses PT offer to return at a later time on this date. PT will follow up as time allows.   Zenaida Niece 01/19/2021, 2:48 PM

## 2021-01-19 NOTE — Progress Notes (Signed)
Patient ID: Patricia [name], female   DOB: [birth year], 85 y.o.   MRN: [id number]     Advanced Heart Failure Rounding Note  PCP-Cardiologist: [name] Martinique, MD   Subjective:    UOP 700 cc recorded.  Creatinine 3 => 3.14.  SBP 90s generally, HR 110s-120s atrial fibrillation.   Not short of breath at rest.    Objective:   Weight Range: 98.8 kg Body mass index is 39.84 kg/m.   Vital Signs:   Temp:  [97.5 F (36.4 C)-98.2 F (36.8 C)] 97.5 F (36.4 C) (03/26 0743) Pulse Rate:  [100-281] 124 (03/26 0743) Resp:  [10-21] 20 (03/26 0743) BP: (96-131)/(53-111) 98/83 (03/26 0743) SpO2:  [95 %-99 %] 97 % (03/26 0842) FiO2 (%):  [32 %-36 %] 36 % (03/26 0842) Weight:  [98.8 kg] 98.8 kg (03/26 0245) Last BM Date: 01/08/2021  Weight change: Filed Weights   01/17/21 0605 01/17/2021 0030 01/19/21 0245  Weight: 98.8 kg 97.9 kg 98.8 kg    Intake/Output:   Intake/Output Summary (Last 24 hours) at 01/19/2021 0900 Last data filed at 01/19/2021 0835 Gross per 24 hour  Intake 1619.59 ml  Output 700 ml  Net 919.59 ml      Physical Exam    General:  Well appearing. No resp difficulty HEENT: Normal Neck: Supple. JVP 16 cm. Carotids 2+ bilat; no bruits. No lymphadenopathy or thyromegaly appreciated. Cor: PMI nondisplaced. Tachy, irregular rate & rhythm. No rubs, gallops or murmurs. Lungs: Clear Abdomen: Soft, nontender, nondistended. No hepatosplenomegaly. No bruits or masses. Good bowel sounds. Extremities: No cyanosis, clubbing, rash. 1+ ankle edema.  Neuro: Alert & orientedx3, cranial nerves grossly intact. moves all 4 extremities w/o difficulty. Affect pleasant   Telemetry   AF 110s-120s (personally reviewed)  Labs    CBC Recent Labs    01/17/21 0318 01/12/2021 1543  WBC 5.6  --   NEUTROABS 4.2  --   HGB 9.4* 9.9*  10.2*  HCT 29.9* 29.0*  30.0*  MCV 94.0  --   PLT 133*  --    Basic Metabolic Panel Recent Labs    01/04/2021 0403 12/29/2020 1543 01/19/21 0223  NA 136 139   139 136  K 4.0 3.8  3.9 3.8  CL 93*  --  94*  CO2 32  --  33*  GLUCOSE 157*  --  183*  BUN 81*  --  88*  CREATININE 3.00*  --  3.14*  CALCIUM 8.5*  --  8.5*  MG 2.0  --  1.9  PHOS  --   --  6.6*   Liver Function Tests Recent Labs    01/16/21 1656 01/19/21 0223  AST 14*  --   ALT 13  --   ALKPHOS 50  --   BILITOT 0.6  --   PROT 6.4*  --   ALBUMIN 3.4* 3.3*   No results for input(s): LIPASE, AMYLASE in the last 72 hours. Cardiac Enzymes No results for input(s): CKTOTAL, CKMB, CKMBINDEX, TROPONINI in the last 72 hours.  BNP: BNP (last 3 results) Recent Labs    09/17/20 1455 12/17/20 0022 01/10/2021 1904  BNP 572* 574.3* 1,681.6*    ProBNP (last 3 results) No results for input(s): PROBNP in the last 8760 hours.   D-Dimer No results for input(s): DDIMER in the last 72 hours. Hemoglobin A1C No results for input(s): HGBA1C in the last 72 hours. Fasting Lipid Panel No results for input(s): CHOL, HDL, LDLCALC, TRIG, CHOLHDL, LDLDIRECT in the last 72 hours. Thyroid Function  Tests Recent Labs    01/16/21 1656  TSH 9.138*    Other results:   Imaging    CARDIAC CATHETERIZATION  Result Date: 01/19/2021  Hemodynamic findings consistent with severe pulmonary hypertension. Mean PAP 46-48 mmHg  Moderately reduced cardiac output of index with PA sat of 50%; CO-CI of 4.37-2.21  Patient still remains significant volume overloaded with elevated PA pressures.  Unfortunately her anatomy did not allow [place] to complete the full right heart catheterization with wedge pressure.  If absolutely indicated in the future, would consider left brachial versus IJ access.  However the patient did not seem to be interested in having another procedure. Glenetta Hew, MD  US RENAL  Result Date: 01/05/2021 CLINICAL DATA:  Acute kidney injury. EXAM: RENAL / URINARY TRACT ULTRASOUND COMPLETE COMPARISON:  Abdominal ultrasound 10/23/2020, abdominal CT 04/17/2020 FINDINGS: Right Kidney: Renal  measurements: 12.2 x 5.7 x 6.1 cm = volume: 220 mL. No hydronephrosis. Mild renal parenchymal thinning and increased echogenicity. Trace perinephric fluid about the lower pole. Hypoechoic lesion with low level internal echoes in the mid kidney measures 1.7 x 1.5 x 1.5 cm. This is unchanged in size from prior exam. Left Kidney: Renal measurements: 11.4 x 6.2 x 5.6 cm = volume: 206 mL. No hydronephrosis. Mild renal parenchymal thinning and increased parenchymal echogenicity. No focal lesion or evidence of stone. Bladder: Appears normal for degree of bladder distention. Other: None. IMPRESSION: 1. No obstructive uropathy. 2. Mild thinning of the renal parenchyma with increased echogenicity consistent with chronic medical renal disease. 3. Trace right perinephric fluid about the inferior pole. 4. Hypoechoic lesion in the right kidney is unchanged in size from prior exam measuring 1.7 cm, likely a complex/hemorrhagic cyst. Electronically Signed   By: Keith Rake M.D.   On: 01/13/2021 23:27      Medications:     Scheduled Medications: . apixaban  2.5 mg Oral BID  . fluticasone furoate-vilanterol  1 puff Inhalation Daily   And  . umeclidinium bromide  1 puff Inhalation Daily  . insulin aspart  0-5 Units Subcutaneous QHS  . insulin aspart  0-9 Units Subcutaneous TID WC  . levothyroxine  88 mcg Oral Q0600  . metolazone  5 mg Oral BID  . midodrine  5 mg Oral TID WC  . pantoprazole  40 mg Oral BID  . QUEtiapine  12.5 mg Oral QHS  . rosuvastatin  10 mg Oral Daily  . sodium chloride flush  3 mL Intravenous Q12H  . sodium chloride flush  3 mL Intravenous Q12H     Infusions: . sodium chloride    . amiodarone 30 mg/hr (01/19/21 0443)  . furosemide (LASIX) 200 mg in dextrose 5% 100 mL (41m/mL) infusion 20 mg/hr (01/19/21 0524)  . milrinone 0.25 mcg/kg/min (01/19/21 0423)     PRN Medications:  sodium chloride, acetaminophen **OR** acetaminophen, albuterol, azelastine,  dextromethorphan-guaiFENesin, hydrALAZINE, labetalol, methocarbamol, Muscle Rub, ondansetron **OR** ondansetron (ZOFRAN) IV, sodium chloride flush  Assessment/Plan   1. Acute on chronic systolic CHF: Cause of cardiomyopathy uncertain.  Patient has history of 1-vessel CAD with 75% OM3 stenosis on 11/17 cath.  Cannot rule out tachy-mediated CMP.  Echo this admission with EF 25-30%, severe LVH, moderately decreased RV systolic function, mild-moderate MR, stable TAVR valve with mean gradient 12 mmHg, IVC dilated. RHC today with elevated RA pressure and PA pressure, unable to get PCWP.  CI 2.2, SBP 90s-100s.  Diuresis has been attempted but progressing poorly on Lasix gtt 20 mg/hr.  Creatinine marginally higher  at 3.14 but only 700 cc UOP yesterday. Progressive cardiorenal syndrome.  - Continue Lasix 20 mg/hr and add metolazone 5 mg bid.  - If she does not respond, will need to consider CVVH though she would be poor candidate for long-term HD.  - Continue milrinone 0.25 mcg/kg/min with progressive renal dysfunction, marginal cardiac index, and elevated PA pressure.  - Will aim for TEE-guided DCCV in the future to get her back into NSR.  - Has wide LBBB, may be CRT candidate down the road but would need significant stabilization.  2. CAD: LHC in 11/17 with 75% OM3.  Not candidate for coronary angiography at this point with AKI and creatinine up to 3.1. No chest pain.  - Continue statin.  3. Atrial fibrillation: History of PAF, admitted here with AF/RVR, HR in 100s-120s.  This likely contributes to CHF exacerbation. Has not been on anticoagulation due to history of recurrent GI bleeding.  HR remains elevated today on amiodarone gtt.  - Increase amiodarone to 60 mg/hr.  - Continue apixaban 2.5 mg bid.   - Hopefully will get to TEE-guided DCCV next week.  Suspect she will be able to tolerate apixaban for at least a month or so post-DCCV.  4. AKI on CKD stage IIIb: Creatinine up to 3.14 with poor diuresis.   Suspect cardiorenal syndrome.   - Nephrology following, if she does not respond to augmented diuretics will have to consider CVVH (?tomorrow) but would be poor long-term HD candidate.   5. H/o TAVR: Bioprosthetic AoV stable on 3/22 echo.  6. COPD, OHS: On home oxygen at baseline.   Overall think she has poor prognosis.  Will involve the palliative care service.  Combination of heart failure and renal failure is going to be difficult to improve.   Length of Stay: 3  [name], MD  01/19/2021, 9:00 AM  Advanced Heart Failure Team Pager [phone] (M-F; 7a - 5p)  Please contact [facility] for night-coverage after hours (5p -7a ) and weekends on [email or web address]

## 2021-01-19 NOTE — Progress Notes (Signed)
   NAME:  Patricia [name], MRN:  [id number], DOB:  June 05, 1936, LOS: 3 ADMISSION DATE:  01/12/2021, CONSULTATION DATE:  01/17/21 REFERRING MD:  Starla Link - [facility], CHIEF COMPLAINT:  SOB  History of Present Illness:  85 yo F PMH OHS, CKD IV, Afib, COPD, chronic hypoxic failure on home 2L O2, s/p TAVR, CAD HFrEF who presented to ED 3/22 for SOB. Associated hypoxia with patient noting sats 88% on 2L, bilateral lower extremity pitting edema, and fluctuating elevated HR. BNP 1681, CXR with bilateral pleural effusions and pulmonary edema. Admitted to [facility] for acute heart failure exacerbation. Got 42m lasix in ED, and total of 1439mLasix 3/23 without a robust response. ECHO obtained 3/23 which revealed LVEF 25-30%, LVH, reduced RV systolic function but normal RV size. Severely dilated LA. Moderately dilated RA.    3/24 Cr has bumped and lasix being held. Cardiology suggests PCCM consult to evaluate SOB for pulmonary etiology of SOB.  Pertinent  Medical History  OHS Moderate COPD  Chronic hypoxic respiratory failure Systolic heart failure Pulmonary hypertension  Afib AS s/p TAVR CAD DM2 CKD IV Hypothyroidism  Significant Hospital Events: Including procedures, antibiotic start and stop dates in addition to other pertinent events   . [facility] w pulm edema, pleural effusions. BNP 1600. Got 40 lasix . 3/23 cards consulted. ECHO with worse EF (25% from prior 40%) and new RV dysfunction. got higher dose lasix did not have robust response.  . 3/24 Creatinine bumped, lasix held. pulm consulted for SOB.   Interim History / Subjective:  No events. Sluggish diuresis. Remains in Afib RVR. Overall breathing has improved from admission. Seen with Dr. McAundra Dubin Objective   Blood pressure 98/83, pulse (!) 124, temperature (!) 97.5 F (36.4 C), temperature source Oral, resp. rate 20, height _0  (1.575 m), weight 98.8 kg, SpO2 97 %.    FiO2 (%):  [32 %-36 %] 36 %   Intake/Output  Summary (Last 24 hours) at 01/19/2021 1006 Last data filed at 01/19/2021 086144ross per 24 hour  Intake 1379.59 ml  Output 700 ml  Net 679.59 ml   Filed Weights   01/17/21 0605 01/02/2021 0030 01/19/21 0245  Weight: 98.8 kg 97.9 kg 98.8 kg    Examination: Constitutional: no acute distress laying in bed  Eyes: eomi, pupils equal Ears, nose, mouth, and throat: JVD, MMM Cardiovascular: irregular ext warm Respiratory: diminished due to body habitus, normal rate Gastrointestinal: soft, hypoactive BS Skin: No rashes, normal turgor Neurologic: moves all 4 ext to command Psychiatric: fair insight  BUN/Cr creeping up No CBC  Labs/imaging that I havepersonally reviewed   BUN 81 Cr 3.0  RHC CI 2.2 by Fick No wedge Elevated RV filling pressures and moderate pHTN but cannot determine how [facility]  Resolved Hospital Problem list     Assessment & Plan:  Groups 2/3 pulmonary hypertension. Decompensated biventricular heart failure AKI on CKD question cardiorenal Afib/RVR with LBBB on amio Chronic hypoxemia and OHS Volume overloaded state of heart  - Further diuresis and inotropes per CHF team - We can place iHD catheter PRN; patient does not seem to want long term HD - Encourage PAP if she will tolerate - Increase activity as tolerated, IS - PCCM available PRN  DaErskine EmeryD PCCM

## 2021-01-19 NOTE — Progress Notes (Signed)
Patient ID: Patricia [name], female   DOB: 05-11-1936, 85 y.o.   MRN: [id number]  PROGRESS NOTE    Patricia [name]  QJF:[id number] DOB: Jul 11, 1936 DOA: 01/05/2021 PCP: Patricia Chandler, NP   Brief Narrative:  85 y.o. female with medical history significant for chronic systolic CHF (EF improved from 25-30% to 40-45% on last TTE 11/07/2019), chronic respiratory failure with hypoxia on 2 L supplemental O2 via [place] at all times, paroxysmal atrial fibrillation on amiodarone, COPD, aortic stenosis s/p TAVR, CKD stage IV, T2DM, HTN, hypothyroidism, HLD, LBBB, and OHS not on BiPAP or CPAP presented with worsening shortness of breath.  On presentation, creatinine was 2.18, BNP of 1681.6 with high-sensitivity troponin of 35.  COVID-19 test was negative.  Chest x-ray showed pulmonary edema and bilateral pleural effusions with cardiomegaly.  She was given IV Lasix.  Assessment & Plan:   Acute on chronic systolic CHF -Presented with orthopnea, DOE, peripheral edema.  Last EF 40-45% on 11/07/2019.  BNP >1600.  Troponin minimally elevated likely due to demand ischemia. -Strict input and output.  Daily weights.  Fluid restriction.   -Echo shows EF of 25 to 30% with mild to moderate mitral regurgitation. -Cardiology following: Patient was treated with IV Lasix with poor response and worsening renal function.  She underwent right heart catheterization on 01/14/2021 which showed severe pulmonary hypertension with significant volume overload with elevated PA pressures. -Subsequently heart failure team has been consulted and patient is currently on Lasix and milrinone drips.  Acute on chronic respiratory failure with hypoxia -Wears 2 L oxygen via nasal cannula at the time at home.  Required 4 L on presentation in the ED.  Currently on 4 L oxygen via nasal cannula. -VQ scan was refused by patient -Pulmonary evaluation appreciated  Acute kidney injury on chronic kidney disease stage IV -Baseline creatinine around 1.8-2.   Probably from cardiorenal syndrome.  Creatinine worsening to 3.14 today.  Monitor creatinine.  -Renal ultrasound was negative for hydronephrosis -Nephrology following.  COPD -Currently stable.  Continue home inhalers  Paroxysmal A. fib -Currently still tachycardic.  Was not on anticoagulation as an outpatient due to history of GI bleed -EP evaluation appreciated: EP may consider CRT-P if patient's condition improved significantly and patient is able to lie flat for extended period of time during this hospitalization. -Patient has been started on amiodarone drip along with oral Eliquis by heart failure team on 12/30/2020.  History of aortic stenosis status post TAVR -Cardiology following.  Diabetes mellitus type II -Home Januvia and Amaryl on hold for now.  Continue CBGs with SSI.  A1c 7.1  Hypertension -Blood pressure on the lower side.  Continue Lasix and doxazosin  Hyperlipidemia Continue statin  Anemia of chronic disease -From renal failure.  Hemoglobin stable.  Monitor  Thrombocytopenia -Questionable cause.  Monitor intermittently  Obesity hypoventilation syndrome -Outpatient sleep study was recommended, not completed and not currently on CPAP or BiPAP as an outpatient.  Generalized deconditioning -PT eval -Overall prognosis is guarded to poor: Currently remains full code.  Palliative care evaluation is pending.  DVT prophylaxis: Eliquis Code Status: Full Family Communication: None at bedside Disposition Plan: Status is: Inpatient because: Inpatient level of care appropriate due to severity of illness  Dispo: The patient is from: Home              Anticipated d/c is to: Home              Patient currently is not medically stable to d/c.  Difficult to place patient No   Consultants: Cardiology/EP/pulmonary/palliative care/heart failure team/nephrology  Procedures: Echo  Right heart cath on 01/12/2021  Antimicrobials: None   Subjective: Patient seen and  examined at bedside.  Still feels short of breath with minimal exertion.  No chest pain, palpitations, nausea or vomiting reported.   Objective: Vitals:   01/19/21 0017 01/19/21 0245 01/19/21 0457 01/19/21 0743  BP: 106/76  100/64 98/83  Pulse:    (!) 124  Resp: _0 Temp: 97.8 F (36.6 C)  97.7 F (36.5 C) (!) 97.5 F (36.4 C)  TempSrc: Oral  Oral Oral  SpO2: 96%  98% 98%  Weight:  98.8 kg    Height:        Intake/Output Summary (Last 24 hours) at 01/19/2021 0746 Last data filed at 01/19/2021 0501 Gross per 24 hour  Intake 1259.59 ml  Output 700 ml  Net 559.59 ml   Filed Weights   01/17/21 0605 12/26/2020 0030 01/19/21 0245  Weight: 98.8 kg 97.9 kg 98.8 kg    Examination:  General exam: No acute distress.  Currently on 4 L oxygen via nasal cannula.  Elderly female/chronically ill looking lying in bed. Respiratory system: Decreased breath sounds at bases bilaterally with basilar crackles cardiovascular system: Tachycardic, S1-S2 heard Gastrointestinal system: Abdomen is mildly distended, soft and nontender.  Bowel sounds are heard  extremities: Mild lower extremity edema present bilaterally; no clubbing  Central nervous system: Awake and alert.  No focal neurological deficits.  Moves extremities  skin: No obvious ecchymosis/lesions Psychiatry: Affect is flat   Data Reviewed: I have personally reviewed following labs and imaging studies  CBC: Recent Labs  Lab 01/10/2021 1904 01/16/21 0437 01/17/21 0318 12/31/2020 1543  WBC 5.8 6.0 5.6  --   NEUTROABS 5.3  --  4.2  --   HGB 10.2* 9.4* 9.4* 9.9*  10.2*  HCT 34.2* 31.6* 29.9* 29.0*  30.0*  MCV 95.3 97.2 94.0  --   PLT 150 138* 133*  --    Basic Metabolic Panel: Recent Labs  Lab 01/19/2021 1904 01/14/2021 2104 01/16/21 0437 01/17/21 0318 01/17/2021 0403 12/25/2020 1543 01/19/21 0223  NA 137  --  137 137 136 139  139 136  K 5.7*   < > 4.6 4.4 4.0 3.8  3.9 3.8  CL 97*  --  97* 96* 93*  --  94*  CO2 31  --  31  33* 32  --  33*  GLUCOSE 223*  --  190* 143* 157*  --  183*  BUN 60*  --  63* 73* 81*  --  88*  CREATININE 2.18*  --  2.16* 2.81* 3.00*  --  3.14*  CALCIUM 9.4  --  9.2 8.9 8.5*  --  8.5*  MG  --   --  1.9 1.9 2.0  --  1.9  PHOS  --   --   --   --   --   --  6.6*   < > = values in this interval not displayed.   GFR: Estimated Creatinine Clearance: 15.2 mL/min (A) (by C-G formula based on SCr of 3.14 mg/dL (H)). Liver Function Tests: Recent Labs  Lab 01/14/2021 1904 01/16/21 1656 01/19/21 0223  AST 29 14*  --   ALT 18 13  --   ALKPHOS 55 50  --   BILITOT 0.7 0.6  --   PROT 7.1 6.4*  --   ALBUMIN 3.8 3.4* 3.3*   No results for  input(s): LIPASE, AMYLASE in the last 168 hours. No results for input(s): AMMONIA in the last 168 hours. Coagulation Profile: No results for input(s): INR, PROTIME in the last 168 hours. Cardiac Enzymes: No results for input(s): CKTOTAL, CKMB, CKMBINDEX, TROPONINI in the last 168 hours. BNP (last 3 results) No results for input(s): PROBNP in the last 8760 hours. HbA1C: No results for input(s): HGBA1C in the last 72 hours. CBG: Recent Labs  Lab 01/19/2021 0740 01/05/2021 1149 01/20/2021 1634 01/21/2021 2107 01/19/21 0605  GLUCAP 137* 176* 114* 196* 177*   Lipid Profile: No results for input(s): CHOL, HDL, LDLCALC, TRIG, CHOLHDL, LDLDIRECT in the last 72 hours. Thyroid Function Tests: Recent Labs    01/16/21 1656  TSH 9.138*   Anemia Panel: Recent Labs    01/19/21 0223  FERRITIN 397*  TIBC 281  IRON 34   Sepsis Labs: No results for input(s): PROCALCITON, LATICACIDVEN in the last 168 hours.  Recent Results (from the past 240 hour(s))  Resp Panel by RT-PCR (Flu A&B, Covid) Nasopharyngeal Swab     Status: None   Collection Time: 01/13/2021  9:04 PM   Specimen: Nasopharyngeal Swab; Nasopharyngeal(NP) swabs in vial transport medium  Result Value Ref Range Status   SARS Coronavirus 2 by RT PCR NEGATIVE NEGATIVE Final    Comment: (NOTE) SARS-CoV-2  target nucleic acids are NOT DETECTED.  The SARS-CoV-2 RNA is generally detectable in upper respiratory specimens during the acute phase of infection. The lowest concentration of SARS-CoV-2 viral copies this assay can detect is 138 copies/mL. A negative result does not preclude SARS-Cov-2 infection and should not be used as the sole basis for treatment or other patient management decisions. A negative result may occur with  improper specimen collection/handling, submission of specimen other than nasopharyngeal swab, presence of viral mutation(s) within the areas targeted by this assay, and inadequate number of viral copies(<138 copies/mL). A negative result must be combined with clinical observations, patient history, and epidemiological information. The expected result is Negative.  Fact Sheet for Patients:  EntrepreneurPulse.com.au  Fact Sheet for Healthcare Providers:  IncredibleEmployment.be  This test is no t yet approved or cleared by the [place] FDA and  has been authorized for detection and/or diagnosis of SARS-CoV-2 by FDA under an Emergency Use Authorization (EUA). This EUA will remain  in effect (meaning this test can be used) for the duration of the COVID-19 declaration under Section 564(b)(1) of the Act, 21 U.S.C.section 360bbb-3(b)(1), unless the authorization is terminated  or revoked sooner.       Influenza A by PCR NEGATIVE NEGATIVE Final   Influenza B by PCR NEGATIVE NEGATIVE Final    Comment: (NOTE) The Xpert Xpress SARS-CoV-2/FLU/RSV plus assay is intended as an aid in the diagnosis of influenza from Nasopharyngeal swab specimens and should not be used as a sole basis for treatment. Nasal washings and aspirates are unacceptable for Xpert Xpress SARS-CoV-2/FLU/RSV testing.  Fact Sheet for Patients: EntrepreneurPulse.com.au  Fact Sheet for Healthcare  Providers: IncredibleEmployment.be  This test is not yet approved or cleared by the [place] FDA and has been authorized for detection and/or diagnosis of SARS-CoV-2 by FDA under an Emergency Use Authorization (EUA). This EUA will remain in effect (meaning this test can be used) for the duration of the COVID-19 declaration under Section 564(b)(1) of the Act, 21 U.S.C. section 360bbb-3(b)(1), unless the authorization is terminated or revoked.  Performed at [facility], [place] [address] [place], [place] [address]  Radiology Studies: CARDIAC CATHETERIZATION  Result Date: 01/10/2021  Hemodynamic findings consistent with severe pulmonary hypertension. Mean PAP 46-48 mmHg  Moderately reduced cardiac output of index with PA sat of 50%; CO-CI of 4.37-2.21  Patient still remains significant volume overloaded with elevated PA pressures.  Unfortunately her anatomy did not allow [place] to complete the full right heart catheterization with wedge pressure.  If absolutely indicated in the future, would consider left brachial versus IJ access.  However the patient did not seem to be interested in having another procedure. Glenetta Hew, MD  US RENAL  Result Date: 01/13/2021 CLINICAL DATA:  Acute kidney injury. EXAM: RENAL / URINARY TRACT ULTRASOUND COMPLETE COMPARISON:  Abdominal ultrasound 10/23/2020, abdominal CT 04/17/2020 FINDINGS: Right Kidney: Renal measurements: 12.2 x 5.7 x 6.1 cm = volume: 220 mL. No hydronephrosis. Mild renal parenchymal thinning and increased echogenicity. Trace perinephric fluid about the lower pole. Hypoechoic lesion with low level internal echoes in the mid kidney measures 1.7 x 1.5 x 1.5 cm. This is unchanged in size from prior exam. Left Kidney: Renal measurements: 11.4 x 6.2 x 5.6 cm = volume: 206 mL. No hydronephrosis. Mild renal parenchymal thinning and increased parenchymal echogenicity. No focal lesion or evidence of stone. Bladder:  Appears normal for degree of bladder distention. Other: None. IMPRESSION: 1. No obstructive uropathy. 2. Mild thinning of the renal parenchyma with increased echogenicity consistent with chronic medical renal disease. 3. Trace right perinephric fluid about the inferior pole. 4. Hypoechoic lesion in the right kidney is unchanged in size from prior exam measuring 1.7 cm, likely a complex/hemorrhagic cyst. Electronically Signed   By: Keith Rake M.D.   On: 01/21/2021 23:27   DG CHEST PORT 1 VIEW  Result Date: 01/17/2021 CLINICAL DATA:  Shortness of breath EXAM: PORTABLE CHEST 1 VIEW COMPARISON:  01/10/2021 FINDINGS: Stable cardiomegaly. Atherosclerotic calcification of the aortic knob. Small bilateral pleural effusions, right greater than left. Associated bibasilar opacities. Diffuse interstitial prominence. No pneumothorax. IMPRESSION: Cardiomegaly with small bilateral pleural effusions and mild interstitial edema, suggesting CHF with mild pulmonary edema. No appreciable interval change from prior. Electronically Signed   By: Davina Poke D.O.   On: 01/17/2021 13:14        Scheduled Meds: . apixaban  2.5 mg Oral BID  . fluticasone furoate-vilanterol  1 puff Inhalation Daily   And  . umeclidinium bromide  1 puff Inhalation Daily  . insulin aspart  0-5 Units Subcutaneous QHS  . insulin aspart  0-9 Units Subcutaneous TID WC  . levothyroxine  88 mcg Oral Q0600  . pantoprazole  40 mg Oral BID  . QUEtiapine  12.5 mg Oral QHS  . rosuvastatin  10 mg Oral Daily  . sodium chloride flush  3 mL Intravenous Q12H   Continuous Infusions: . amiodarone 30 mg/hr (01/19/21 0443)  . furosemide (LASIX) 200 mg in dextrose 5% 100 mL (61m/mL) infusion 20 mg/hr (01/19/21 0524)  . milrinone 0.25 mcg/kg/min (01/19/21 0423)          KAline August MD [facility] 01/19/2021, 7:46 AM

## 2021-01-19 NOTE — Progress Notes (Signed)
Patient refused CPAP for the night. Attempted with the lowest settings and two different types of masks and patient she was to claustrophobic. Patient wearing oxygen set at 3lpm with Sp02=96%.

## 2021-01-20 ENCOUNTER — Inpatient Hospital Stay (HOSPITAL_COMMUNITY): Payer: Medicare Other

## 2021-01-20 DIAGNOSIS — N1832 Chronic kidney disease, stage 3b: Secondary | ICD-10-CM | POA: Diagnosis not present

## 2021-01-20 DIAGNOSIS — I5023 Acute on chronic systolic (congestive) heart failure: Secondary | ICD-10-CM | POA: Diagnosis not present

## 2021-01-20 DIAGNOSIS — J449 Chronic obstructive pulmonary disease, unspecified: Secondary | ICD-10-CM | POA: Diagnosis not present

## 2021-01-20 DIAGNOSIS — N179 Acute kidney failure, unspecified: Secondary | ICD-10-CM

## 2021-01-20 DIAGNOSIS — J9621 Acute and chronic respiratory failure with hypoxia: Secondary | ICD-10-CM | POA: Diagnosis not present

## 2021-01-20 DIAGNOSIS — I509 Heart failure, unspecified: Secondary | ICD-10-CM

## 2021-01-20 LAB — CBC
HCT: 28.2 % — ABNORMAL LOW (ref 36.0–46.0)
Hemoglobin: 9 g/dL — ABNORMAL LOW (ref 12.0–15.0)
MCH: 29.3 pg (ref 26.0–34.0)
MCHC: 31.9 g/dL (ref 30.0–36.0)
MCV: 91.9 fL (ref 80.0–100.0)
Platelets: 126 10*3/uL — ABNORMAL LOW (ref 150–400)
RBC: 3.07 MIL/uL — ABNORMAL LOW (ref 3.87–5.11)
RDW: 15.9 % — ABNORMAL HIGH (ref 11.5–15.5)
WBC: 6.5 10*3/uL (ref 4.0–10.5)
nRBC: 0 % (ref 0.0–0.2)

## 2021-01-20 LAB — BASIC METABOLIC PANEL
Anion gap: 14 (ref 5–15)
BUN: 98 mg/dL — ABNORMAL HIGH (ref 8–23)
CO2: 27 mmol/L (ref 22–32)
Calcium: 8.2 mg/dL — ABNORMAL LOW (ref 8.9–10.3)
Chloride: 92 mmol/L — ABNORMAL LOW (ref 98–111)
Creatinine, Ser: 3.54 mg/dL — ABNORMAL HIGH (ref 0.44–1.00)
GFR, Estimated: 12 mL/min — ABNORMAL LOW (ref >60)
Glucose, Bld: 203 mg/dL — ABNORMAL HIGH (ref 70–99)
Potassium: 3.5 mmol/L (ref 3.5–5.1)
Sodium: 133 mmol/L — ABNORMAL LOW (ref 135–145)

## 2021-01-20 LAB — GLUCOSE, CAPILLARY
Glucose-Capillary: 225 mg/dL — ABNORMAL HIGH (ref 70–99)
Glucose-Capillary: 168 mg/dL — ABNORMAL HIGH (ref 70–99)
Glucose-Capillary: 172 mg/dL — ABNORMAL HIGH (ref 70–99)
Glucose-Capillary: 206 mg/dL — ABNORMAL HIGH (ref 70–99)

## 2021-01-20 LAB — RENAL FUNCTION PANEL
Albumin: 3.3 g/dL — ABNORMAL LOW (ref 3.5–5.0)
Anion gap: 8 (ref 5–15)
BUN: 77 mg/dL — ABNORMAL HIGH (ref 8–23)
CO2: 32 mmol/L (ref 22–32)
Calcium: 7.9 mg/dL — ABNORMAL LOW (ref 8.9–10.3)
Chloride: 92 mmol/L — ABNORMAL LOW (ref 98–111)
Creatinine, Ser: 2.74 mg/dL — ABNORMAL HIGH (ref 0.44–1.00)
GFR, Estimated: 17 mL/min — ABNORMAL LOW (ref >60)
Glucose, Bld: 176 mg/dL — ABNORMAL HIGH (ref 70–99)
Phosphorus: 4.2 mg/dL (ref 2.5–4.6)
Potassium: 3.2 mmol/L — ABNORMAL LOW (ref 3.5–5.1)
Sodium: 132 mmol/L — ABNORMAL LOW (ref 135–145)

## 2021-01-20 LAB — MAGNESIUM: Magnesium: 1.9 mg/dL (ref 1.7–2.4)

## 2021-01-20 LAB — URINALYSIS, COMPLETE (UACMP) WITH MICROSCOPIC
Bilirubin Urine: NEGATIVE
Glucose, UA: NEGATIVE mg/dL
Ketones, ur: NEGATIVE mg/dL
Nitrite: NEGATIVE
Protein, ur: NEGATIVE mg/dL
Specific Gravity, Urine: 1.01 (ref 1.005–1.030)
WBC, UA: >50 WBC/hpf — ABNORMAL HIGH (ref 0–5)
pH: 5 (ref 5.0–8.0)

## 2021-01-20 LAB — COOXEMETRY PANEL
Carboxyhemoglobin: 1.1 % (ref 0.5–1.5)
Methemoglobin: 1.1 % (ref 0.0–1.5)
O2 Saturation: 63.8 %
Total hemoglobin: 8.8 g/dL — ABNORMAL LOW (ref 12.0–16.0)

## 2021-01-20 LAB — PARATHYROID HORMONE, INTACT (NO CA): PTH: 132 pg/mL — ABNORMAL HIGH (ref 15–65)

## 2021-01-20 MED ORDER — CHLORHEXIDINE GLUCONATE CLOTH 2 % EX PADS
6.0000 | MEDICATED_PAD | Freq: Every day | CUTANEOUS | Status: DC
  Administered 2021-01-20 – 2021-01-25 (×6): 6 via TOPICAL

## 2021-01-20 MED ORDER — NOREPINEPHRINE 4 MG/250ML-% IV SOLN
0.0000 ug/min | INTRAVENOUS | Status: DC
  Administered 2021-01-20: 2 ug/min via INTRAVENOUS
  Administered 2021-01-21: 6 ug/min via INTRAVENOUS
  Administered 2021-01-21: 7 ug/min via INTRAVENOUS
  Filled 2021-01-20 (×3): qty 250

## 2021-01-20 MED ORDER — HEPARIN (PORCINE) 25000 UT/250ML-% IV SOLN
1300.0000 [IU]/h | INTRAVENOUS | Status: DC
  Administered 2021-01-20: 1000 [IU]/h via INTRAVENOUS
  Administered 2021-01-21: 1250 [IU]/h via INTRAVENOUS
  Administered 2021-01-22 – 2021-01-25 (×4): 1300 [IU]/h via INTRAVENOUS
  Filled 2021-01-20 (×6): qty 250

## 2021-01-20 MED ORDER — PRISMASOL BGK 4/2.5 32-4-2.5 MEQ/L EC SOLN
Status: DC
  Filled 2021-01-20 (×4): qty 5000

## 2021-01-20 MED ORDER — PRISMASOL BGK 4/2.5 32-4-2.5 MEQ/L REPLACEMENT SOLN
Status: DC
  Filled 2021-01-20 (×2): qty 5000

## 2021-01-20 MED ORDER — MIDODRINE HCL 5 MG PO TABS
10.0000 mg | ORAL_TABLET | Freq: Three times a day (TID) | ORAL | Status: DC
  Administered 2021-01-20 – 2021-01-22 (×6): 10 mg via ORAL
  Filled 2021-01-20 (×6): qty 2

## 2021-01-20 MED ORDER — APIXABAN 2.5 MG PO TABS: 2.5000 mg | ORAL_TABLET | Freq: Two times a day (BID) | ORAL | Status: DC

## 2021-01-20 MED ORDER — HEPARIN SODIUM (PORCINE) 1000 UNIT/ML DIALYSIS
1000.0000 [IU] | INTRAMUSCULAR | Status: DC | PRN
  Administered 2021-01-24 – 2021-01-25 (×2): 2800 [IU] via INTRAVENOUS_CENTRAL
  Filled 2021-01-20: qty 5
  Filled 2021-01-20: qty 6
  Filled 2021-01-20: qty 3
  Filled 2021-01-20 (×2): qty 6

## 2021-01-20 MED ORDER — FENTANYL CITRATE (PF) 100 MCG/2ML IJ SOLN
12.5000 ug | INTRAMUSCULAR | Status: DC | PRN
  Administered 2021-01-20 – 2021-01-25 (×6): 12.5 ug via INTRAVENOUS
  Filled 2021-01-20 (×7): qty 2

## 2021-01-20 MED ORDER — PRISMASOL BGK 4/2.5 32-4-2.5 MEQ/L REPLACEMENT SOLN
Status: DC
  Filled 2021-01-20: qty 5000

## 2021-01-20 MED ORDER — METHOCARBAMOL 500 MG PO TABS
750.0000 mg | ORAL_TABLET | Freq: Four times a day (QID) | ORAL | Status: DC | PRN
  Administered 2021-01-20 – 2021-01-23 (×4): 750 mg via ORAL
  Filled 2021-01-20 (×4): qty 2

## 2021-01-20 NOTE — Progress Notes (Signed)
Patient ID: Patricia [name], female   DOB: 02-Aug-1936, 85 y.o.   MRN: [id number] P    Advanced Heart Failure Rounding Note  PCP-Cardiologist: [name] Martinique, MD   Subjective:    UOP 1500 cc but I/Os positive and weight unchanged on Lasix gtt 20 mg/hr with metolazone 5 mg bid.  Creatinine 3 => 3.14 => 3.54.  SBP 90s-100s generally, HR 110s-120s atrial fibrillation.   Uncomfortable, dyspneic last night.     Objective:   Weight Range: 98.5 kg Body mass index is 39.72 kg/m.   Vital Signs:   Temp:  [97.3 F (36.3 C)-98.4 F (36.9 C)] 97.8 F (36.6 C) (03/27 0400) Pulse Rate:  [99-103] 99 (03/26 1559) Resp:  [16-27] 18 (03/27 0656) BP: (77-146)/(35-96) 91/61 (03/27 0645) SpO2:  [93 %-100 %] 100 % (03/27 0645) FiO2 (%):  [36 %] 36 % (03/26 0842) Weight:  [98.5 kg] 98.5 kg (03/27 0000) Last BM Date: 01/05/2021  Weight change: Filed Weights   01/06/2021 0030 01/19/21 0245 01/20/21 0000  Weight: 97.9 kg 98.8 kg 98.5 kg    Intake/Output:   Intake/Output Summary (Last 24 hours) at 01/20/2021 0743 Last data filed at 01/20/2021 0535 Gross per 24 hour  Intake 2278.39 ml  Output 1500 ml  Net 778.39 ml      Physical Exam    General: NAD Neck: JVP 16 cm, no thyromegaly or thyroid nodule.  Lungs: Clear to auscultation bilaterally with normal respiratory effort. CV: Nondisplaced PMI.  Heart mildly tachy, irregular S1/S2, no S3/S4, no murmur.  1+ edema to knees.  Abdomen: Soft, nontender, no hepatosplenomegaly, no distention.  Skin: Intact without lesions or rashes.  Neurologic: Alert and oriented x 3.  Psych: Normal affect. Extremities: No clubbing or cyanosis.  HEENT: Normal.    Telemetry   AF 110s-120s (personally reviewed)  Labs    CBC Recent Labs    01/19/21 0928 01/20/21 0414  WBC 5.5 6.5  HGB 9.5* 9.0*  HCT 31.8* 28.2*  MCV 94.9 91.9  PLT 126* 712*   Basic Metabolic Panel Recent Labs    01/19/21 0223 01/20/21 0414  NA 136 133*  K 3.8 3.5  CL 94* 92*   CO2 33* 27  GLUCOSE 183* 203*  BUN 88* 98*  CREATININE 3.14* 3.54*  CALCIUM 8.5* 8.2*  MG 1.9 1.9  PHOS 6.6*  --    Liver Function Tests Recent Labs    01/19/21 0223  ALBUMIN 3.3*   No results for input(s): LIPASE, AMYLASE in the last 72 hours. Cardiac Enzymes No results for input(s): CKTOTAL, CKMB, CKMBINDEX, TROPONINI in the last 72 hours.  BNP: BNP (last 3 results) Recent Labs    09/17/20 1455 12/17/20 0022 01/03/2021 1904  BNP 572* 574.3* 1,681.6*    ProBNP (last 3 results) No results for input(s): PROBNP in the last 8760 hours.   D-Dimer No results for input(s): DDIMER in the last 72 hours. Hemoglobin A1C No results for input(s): HGBA1C in the last 72 hours. Fasting Lipid Panel No results for input(s): CHOL, HDL, LDLCALC, TRIG, CHOLHDL, LDLDIRECT in the last 72 hours. Thyroid Function Tests No results for input(s): TSH, T4TOTAL, T3FREE, THYROIDAB in the last 72 hours.  Invalid input(s): FREET3  Other results:   Imaging    No results found.   Medications:     Scheduled Medications:  apixaban  2.5 mg Oral BID   fluticasone furoate-vilanterol  1 puff Inhalation Daily   And   umeclidinium bromide  1 puff Inhalation Daily  insulin aspart  0-5 Units Subcutaneous QHS   insulin aspart  0-9 Units Subcutaneous TID WC   levothyroxine  88 mcg Oral Q0600   metolazone  5 mg Oral BID   midodrine  10 mg Oral TID WC   pantoprazole  40 mg Oral BID   QUEtiapine  12.5 mg Oral QHS   rosuvastatin  10 mg Oral Daily   sodium chloride flush  3 mL Intravenous Q12H   sodium chloride flush  3 mL Intravenous Q12H    Infusions:  sodium chloride     amiodarone 60 mg/hr (01/20/21 0436)   furosemide (LASIX) 200 mg in dextrose 5% 100 mL (7m/mL) infusion 20 mg/hr (01/20/21 0459)   milrinone 0.25 mcg/kg/min (01/20/21 0459)    PRN Medications: sodium chloride, acetaminophen **OR** acetaminophen, albuterol, ALPRAZolam, azelastine,  dextromethorphan-guaiFENesin, methocarbamol, Muscle Rub, ondansetron **OR** ondansetron (ZOFRAN) IV, sodium chloride flush  Assessment/Plan   1. Acute on chronic systolic CHF: Cause of cardiomyopathy uncertain.  Patient has history of 1-vessel CAD with 75% OM3 stenosis on 11/17 cath.  Cannot rule out tachy-mediated CMP.  Echo this admission with EF 25-30%, severe LVH, moderately decreased RV systolic function, mild-moderate MR, stable TAVR valve with mean gradient 12 mmHg, IVC dilated. RHC today with elevated RA pressure and PA pressure, unable to get PCWP.  CI 2.2, SBP 90s-100s.  Diuresis has been attempted but progressing poorly on Lasix gtt 20 mg/hr + metolazone 5 bid.  Creatinine higher today at 3.5 but only 1500 cc UOP yesterday with unchanged weight. Progressive cardiorenal syndrome.  - Continue Lasix 20 mg/hr and metolazone 5 mg bid for now.  - Discussed a trial of CVVH with her to see if renal function would stabilize with fluid removal, think she would be a poor candidate for long-term HD.  - Continue milrinone for now, but with worsening renal function and soft BP, would plan to transition over to low dose norepinephrine once she has central access.  - Will aim for TEE-guided DCCV in the future to get her back into NSR but think she needs some fluid off first to hold NSR long-term.  - Has wide LBBB, may be CRT candidate down the road but would need significant stabilization.  2. CAD: LHC in 11/17 with 75% OM3.  Not candidate for coronary angiography at this point with AKI and creatinine up to 3.1. No chest pain.  - Continue statin.  3. Atrial fibrillation: History of PAF, admitted here with AF/RVR, HR in 110s generally.  This likely contributes to CHF exacerbation. Has not been on anticoagulation due to history of recurrent GI bleeding.  HR remains elevated today on amiodarone gtt.  - Continue amiodarone 60 mg/hr.  - Continue apixaban 2.5 mg bid.   - Hopefully will get to TEE-guided DCCV  next week once CHF is better managed.  Suspect she will be able to tolerate apixaban for at least a month or so post-DCCV.  4. AKI on CKD stage IIIb: Creatinine up to 3.58 with poor diuresis.  Suspect cardiorenal syndrome.  Poor response to high dose diuretics.  I think she would be a poor candidate for long-term HD with advanced HF, but reasonable to try short-term CVVH to see if she can stabilize with volume removal acutely.  - Nephrology to see today, will await their input on CVVH initiation.  In the [place]  CCM is following and can place HD catheter.  Hold am apixaban for this.  5. H/o  TAVR: Bioprosthetic AoV stable on 3/22 echo.  6. COPD, OHS: On home oxygen at baseline.   Overall think she has poor prognosis. I told her today that I am very concerned that her renal function will not recover to the point where she can manage her volume status without long-term HD, and I do not think she would tolerate long-term HD. I mentioned hospice as an option if she fails to recover after short-term CVVH. Waiting for palliative care evaluation.     Length of Stay: [place], MD  01/20/2021, 7:43 AM  Advanced Heart Failure Team Pager [phone] (M-F; 7a - 5p)  Please contact [facility] for night-coverage after hours (5p -7a ) and weekends on [email or web address]

## 2021-01-20 NOTE — Plan of Care (Signed)
  Problem: Education: Goal: Knowledge of General Education information will improve Description: Including pain rating scale, medication(s)/side effects and non-pharmacologic comfort measures Outcome: Progressing   Problem: Clinical Measurements: Goal: Respiratory complications will improve Outcome: Progressing   Problem: Clinical Measurements: Goal: Cardiovascular complication will be avoided Outcome: Progressing

## 2021-01-20 NOTE — Progress Notes (Signed)
Pt refuse cpap stating machine is too uncomfortable and she does not want to try cpap again. RT to monitor as needed.

## 2021-01-20 NOTE — Procedures (Signed)
Central Venous Catheter Insertion Procedure Note  JENNY LAI  [id number]  12/10/1935  Date:01/20/21  Time:2:00 PM   Provider Performing:Austine Wiedeman   Procedure: Insertion of Non-tunneled Central Venous Catheter(36556)with US guidance (46803)    Indication(s) Hemodialysis  Consent Risks of the procedure as well as the alternatives and risks of each were explained to the patient and/or caregiver.  Consent for the procedure was obtained and is signed in the bedside chart  Anesthesia Topical only with 1% lidocaine   Timeout Verified patient identification, verified procedure, site/side was marked, verified correct patient position, special equipment/implants available, medications/allergies/relevant history reviewed, required imaging and test results available.  Sterile Technique Maximal sterile technique including full sterile barrier drape, hand hygiene, sterile gown, sterile gloves, mask, hair covering, sterile ultrasound probe cover (if used).  Procedure Description Area of catheter insertion was cleaned with chlorhexidine and draped in sterile fashion.   With real-time ultrasound guidance a HD catheter was placed into the left internal jugular vein.  Nonpulsatile blood flow and easy flushing noted in all ports.  The catheter was sutured in place and sterile dressing applied.  Complications/Tolerance None; patient tolerated the procedure well. Chest X-ray is ordered to verify placement for internal jugular or subclavian cannulation.  Chest x-ray is not ordered for femoral cannulation.  EBL Minimal  Specimen(s) None

## 2021-01-20 NOTE — Progress Notes (Signed)
Patient ID: Patricia [name], female   DOB: Jan 01, 1936, 85 y.o.   MRN: [id number]  PROGRESS NOTE    Patricia [name]  WGN:[id number] DOB: 08/19/1936 DOA: 01/02/2021 PCP: [name], NP   Brief Narrative:  85 y.o. female with medical history significant for chronic systolic CHF (EF improved from 25-30% to 40-45% on last TTE 11/07/2019), chronic respiratory failure with hypoxia on 2 L supplemental O2 via [place] at all times, paroxysmal atrial fibrillation on amiodarone, COPD, aortic stenosis s/p TAVR, CKD stage IV, T2DM, HTN, hypothyroidism, HLD, LBBB, and OHS not on BiPAP or CPAP presented with worsening shortness of breath.  On presentation, creatinine was 2.18, BNP of 1681.6 with high-sensitivity troponin of 35.  COVID-19 test was negative.  Chest x-ray showed pulmonary edema and bilateral pleural effusions with cardiomegaly.  She was given IV Lasix.  Assessment & Plan:   Acute on chronic systolic CHF -Presented with orthopnea, DOE, peripheral edema.  Last EF 40-45% on 11/07/2019.  BNP >1600.  Troponin minimally elevated likely due to demand ischemia. -Strict input and output.  Daily weights.  Fluid restriction.   -Echo shows EF of 25 to 30% with mild to moderate mitral regurgitation. -Cardiology following: Patient was treated with IV Lasix with poor response and worsening renal function.  She underwent right heart catheterization on 01/17/2021 which showed severe pulmonary hypertension with significant volume overload with elevated PA pressures. -Subsequently heart failure team has been consulted and patient is currently on Lasix and milrinone drips.  Acute on chronic respiratory failure with hypoxia -Wears 2 L oxygen via nasal cannula at the time at home.  Required 4 L on presentation in the ED.  Currently still on 4 L oxygen via nasal cannula. -VQ scan was refused by patient -Pulmonary evaluation appreciated  Acute kidney injury on chronic kidney disease stage IV -Baseline creatinine around  1.8-2.  Probably from cardiorenal syndrome.  Creatinine worsening to 3.54 today.  Monitor creatinine.  -Renal ultrasound was negative for hydronephrosis -Nephrology following.  COPD -Currently stable.  Continue home inhalers  Paroxysmal A. fib -Currently still tachycardic.  Was not on anticoagulation as an outpatient due to history of GI bleed -EP evaluation appreciated: EP may consider CRT-P if patient's condition improved significantly and patient is able to lie flat for extended period of time during this hospitalization. -Patient has been started on amiodarone drip along with oral Eliquis by heart failure team on 01/03/2021.  History of aortic stenosis status post TAVR -Cardiology following.  Diabetes mellitus type II -Home Januvia and Amaryl on hold for now.  Continue CBGs with SSI.  A1c 7.1  Hypertension -Blood pressure on the lower side.  Continue Lasix and doxazosin  Hyperlipidemia Continue statin  Anemia of chronic disease -From renal failure.  Hemoglobin stable.  Monitor  Thrombocytopenia -Questionable cause.  Monitor intermittently  Hyponatremia -Mild.  Monitor  Obesity hypoventilation syndrome -Outpatient sleep study was recommended, not completed and not currently on CPAP or BiPAP as an outpatient.  Generalized deconditioning -PT eval -Overall prognosis is guarded to poor: Currently remains full code.  Palliative care evaluation appreciated.  DVT prophylaxis: Eliquis Code Status: Full Family Communication: None at bedside Disposition Plan: Status is: Inpatient because: Inpatient level of care appropriate due to severity of illness  Dispo: The patient is from: Home              Anticipated d/c is to: Home              Patient currently is not  medically stable to d/c.   Difficult to place patient No   Consultants: Cardiology/EP/pulmonary/palliative care/heart failure team/nephrology  Procedures: Echo  Right heart cath on 01/12/2021  Antimicrobials:  None   Subjective: Patient seen and examined at bedside.  Denies current chest pain.  No overnight fever, vomiting, abdominal pain reported.  Still short of breath with minimal exertion.   Objective: Vitals:   01/20/21 0620 01/20/21 0645 01/20/21 0656 01/20/21 0743  BP:  91/61  99/67  Pulse:    (!) 112  Resp: 18 (!) _0 Temp:    (!) 97.4 F (36.3 C)  TempSrc:    Oral  SpO2: 100% 100%  97%  Weight:      Height:        Intake/Output Summary (Last 24 hours) at 01/20/2021 0815 Last data filed at 01/20/2021 0535 Gross per 24 hour  Intake 2278.39 ml  Output 1500 ml  Net 778.39 ml   Filed Weights   12/26/2020 0030 01/19/21 0245 01/20/21 0000  Weight: 97.9 kg 98.8 kg 98.5 kg    Examination:  General exam: Still on 4 L oxygen via nasal cannula.  No distress.  Elderly female/chronically ill looking lying in bed. Respiratory system: Bilateral decreased breath sounds at bases with bibasilar crackles cardiovascular system: S1-S2 heard, intermittently tachycardic  gastrointestinal system: Abdomen is slightly distended, soft and nontender.  Normal bowel sounds heard  extremities: No cyanosis; trace lower extremity edema present Central nervous system: Alert and oriented.  No focal neurological deficits.  Moving extremities  skin: No obvious petechiae/rashes Psychiatry: Flat affect   Data Reviewed: I have personally reviewed following labs and imaging studies  CBC: Recent Labs  Lab 12/27/2020 1904 01/16/21 0437 01/17/21 0318 01/12/2021 1543 01/19/21 0928 01/20/21 0414  WBC 5.8 6.0 5.6  --  5.5 6.5  NEUTROABS 5.3  --  4.2  --   --   --   HGB 10.2* 9.4* 9.4* 9.9*  10.2* 9.5* 9.0*  HCT 34.2* 31.6* 29.9* 29.0*  30.0* 31.8* 28.2*  MCV 95.3 97.2 94.0  --  94.9 91.9  PLT 150 138* 133*  --  126* 264*   Basic Metabolic Panel: Recent Labs  Lab 01/16/21 0437 01/17/21 0318 01/07/2021 0403 01/09/2021 1543 01/19/21 0223 01/20/21 0414  NA 137 137 136 139  139 136 133*  K 4.6 4.4  4.0 3.8  3.9 3.8 3.5  CL 97* 96* 93*  --  94* 92*  CO2 31 33* 32  --  33* 27  GLUCOSE 190* 143* 157*  --  183* 203*  BUN 63* 73* 81*  --  88* 98*  CREATININE 2.16* 2.81* 3.00*  --  3.14* 3.54*  CALCIUM 9.2 8.9 8.5*  --  8.5* 8.2*  MG 1.9 1.9 2.0  --  1.9 1.9  PHOS  --   --   --   --  6.6*  --    GFR: Estimated Creatinine Clearance: 13.4 mL/min (A) (by C-G formula based on SCr of 3.54 mg/dL (H)). Liver Function Tests: Recent Labs  Lab 01/14/2021 1904 01/16/21 1656 01/19/21 0223  AST 29 14*  --   ALT 18 13  --   ALKPHOS 55 50  --   BILITOT 0.7 0.6  --   PROT 7.1 6.4*  --   ALBUMIN 3.8 3.4* 3.3*   No results for input(s): LIPASE, AMYLASE in the last 168 hours. No results for input(s): AMMONIA in the last 168 hours. Coagulation Profile: No results for input(s): INR,  PROTIME in the last 168 hours. Cardiac Enzymes: No results for input(s): CKTOTAL, CKMB, CKMBINDEX, TROPONINI in the last 168 hours. BNP (last 3 results) No results for input(s): PROBNP in the last 8760 hours. HbA1C: No results for input(s): HGBA1C in the last 72 hours. CBG: Recent Labs  Lab 01/19/21 0605 01/19/21 1111 01/19/21 1605 01/19/21 2110 01/20/21 0627  GLUCAP 177* 139* 204* 130* 225*   Lipid Profile: No results for input(s): CHOL, HDL, LDLCALC, TRIG, CHOLHDL, LDLDIRECT in the last 72 hours. Thyroid Function Tests: No results for input(s): TSH, T4TOTAL, FREET4, T3FREE, THYROIDAB in the last 72 hours. Anemia Panel: Recent Labs    01/19/21 0223  FERRITIN 397*  TIBC 281  IRON 34   Sepsis Labs: No results for input(s): PROCALCITON, LATICACIDVEN in the last 168 hours.  Recent Results (from the past 240 hour(s))  Resp Panel by RT-PCR (Flu A&B, Covid) Nasopharyngeal Swab     Status: None   Collection Time: 01/05/2021  9:04 PM   Specimen: Nasopharyngeal Swab; Nasopharyngeal(NP) swabs in vial transport medium  Result Value Ref Range Status   SARS Coronavirus 2 by RT PCR NEGATIVE NEGATIVE Final     Comment: (NOTE) SARS-CoV-2 target nucleic acids are NOT DETECTED.  The SARS-CoV-2 RNA is generally detectable in upper respiratory specimens during the acute phase of infection. The lowest concentration of SARS-CoV-2 viral copies this assay can detect is 138 copies/mL. A negative result does not preclude SARS-Cov-2 infection and should not be used as the sole basis for treatment or other patient management decisions. A negative result may occur with  improper specimen collection/handling, submission of specimen other than nasopharyngeal swab, presence of viral mutation(s) within the areas targeted by this assay, and inadequate number of viral copies(<138 copies/mL). A negative result must be combined with clinical observations, patient history, and epidemiological information. The expected result is Negative.  Fact Sheet for Patients:  EntrepreneurPulse.com.au  Fact Sheet for Healthcare Providers:  IncredibleEmployment.be  This test is no t yet approved or cleared by the [place] FDA and  has been authorized for detection and/or diagnosis of SARS-CoV-2 by FDA under an Emergency Use Authorization (EUA). This EUA will remain  in effect (meaning this test can be used) for the duration of the COVID-19 declaration under Section 564(b)(1) of the Act, 21 U.S.C.section 360bbb-3(b)(1), unless the authorization is terminated  or revoked sooner.       Influenza A by PCR NEGATIVE NEGATIVE Final   Influenza B by PCR NEGATIVE NEGATIVE Final    Comment: (NOTE) The Xpert Xpress SARS-CoV-2/FLU/RSV plus assay is intended as an aid in the diagnosis of influenza from Nasopharyngeal swab specimens and should not be used as a sole basis for treatment. Nasal washings and aspirates are unacceptable for Xpert Xpress SARS-CoV-2/FLU/RSV testing.  Fact Sheet for Patients: EntrepreneurPulse.com.au  Fact Sheet for Healthcare  Providers: IncredibleEmployment.be  This test is not yet approved or cleared by the [place] FDA and has been authorized for detection and/or diagnosis of SARS-CoV-2 by FDA under an Emergency Use Authorization (EUA). This EUA will remain in effect (meaning this test can be used) for the duration of the COVID-19 declaration under Section 564(b)(1) of the Act, 21 U.S.C. section 360bbb-3(b)(1), unless the authorization is terminated or revoked.  Performed at [facility], [place] [address] [place], [place] [address]          Radiology Studies: CARDIAC CATHETERIZATION  Result Date: 01/14/2021  Hemodynamic findings consistent with severe pulmonary hypertension. Mean PAP 46-48 mmHg  Moderately reduced cardiac output of index with PA sat of 50%; CO-CI of 4.37-2.21  Patient still remains significant volume overloaded with elevated PA pressures.  Unfortunately her anatomy did not allow [place] to complete the full right heart catheterization with wedge pressure.  If absolutely indicated in the future, would consider left brachial versus IJ access.  However the patient did not seem to be interested in having another procedure. Glenetta Hew, MD  US RENAL  Result Date: 12/25/2020 CLINICAL DATA:  Acute kidney injury. EXAM: RENAL / URINARY TRACT ULTRASOUND COMPLETE COMPARISON:  Abdominal ultrasound 10/23/2020, abdominal CT 04/17/2020 FINDINGS: Right Kidney: Renal measurements: 12.2 x 5.7 x 6.1 cm = volume: 220 mL. No hydronephrosis. Mild renal parenchymal thinning and increased echogenicity. Trace perinephric fluid about the lower pole. Hypoechoic lesion with low level internal echoes in the mid kidney measures 1.7 x 1.5 x 1.5 cm. This is unchanged in size from prior exam. Left Kidney: Renal measurements: 11.4 x 6.2 x 5.6 cm = volume: 206 mL. No hydronephrosis. Mild renal parenchymal thinning and increased parenchymal echogenicity. No focal lesion or evidence of stone. Bladder:  Appears normal for degree of bladder distention. Other: None. IMPRESSION: 1. No obstructive uropathy. 2. Mild thinning of the renal parenchyma with increased echogenicity consistent with chronic medical renal disease. 3. Trace right perinephric fluid about the inferior pole. 4. Hypoechoic lesion in the right kidney is unchanged in size from prior exam measuring 1.7 cm, likely a complex/hemorrhagic cyst. Electronically Signed   By: Keith Rake M.D.   On: 12/29/2020 23:27        Scheduled Meds: . apixaban  2.5 mg Oral BID  . fluticasone furoate-vilanterol  1 puff Inhalation Daily   And  . umeclidinium bromide  1 puff Inhalation Daily  . insulin aspart  0-5 Units Subcutaneous QHS  . insulin aspart  0-9 Units Subcutaneous TID WC  . levothyroxine  88 mcg Oral Q0600  . metolazone  5 mg Oral BID  . midodrine  10 mg Oral TID WC  . pantoprazole  40 mg Oral BID  . QUEtiapine  12.5 mg Oral QHS  . rosuvastatin  10 mg Oral Daily  . sodium chloride flush  3 mL Intravenous Q12H  . sodium chloride flush  3 mL Intravenous Q12H   Continuous Infusions: . sodium chloride    . amiodarone 60 mg/hr (01/20/21 0436)  . furosemide (LASIX) 200 mg in dextrose 5% 100 mL (76m/mL) infusion 20 mg/hr (01/20/21 0459)  . milrinone 0.25 mcg/kg/min (01/20/21 0459)          KAline August MD [facility] 01/20/2021, 8:15 AM

## 2021-01-20 NOTE — Progress Notes (Signed)
BP running soft this morning 77/51, RN took BP manually and got 110/60. Patient doesn't feel any different and no s/s of low BP, will continue to monitor.

## 2021-01-20 NOTE — Progress Notes (Signed)
Patricia [name] Progress Note    Assessment/ Plan:   AKI on CKD 4 -Baseline creatinine seems to range around 1.6-2 with underlying proteinuria.  Suspect is a component of diabetic kidney disease and hypertensive arterionephrosclerosis -AKI likely related to cardiorenal syndrome -On milrinone and Lasix drip however worsening renal function, BP's on softer side, plan is to transfer to [place] and try for CVVHDF (may need pressor support for this). Appreciate assistance from CCM for temp cath placement. -Discussed renal replacement therapy at length with daughter and patient today at the bedside and they agree to do a trial of this. Discussed with them the potential risks of worsening hypotension, cardiac arrhythmias, dialyzer reactions, dialysis dysequilibrium syndrome, access infection -I am concerned about her long-term candidacy for hemodialysis given her multiple comorbidities especially with her HFrEF secondary to ischemic cardiomyopathy and her chronic respiratory failure. On going discussion with palliative care given overall poor prognosis but first will try for CRRT -Avoid nephrotoxic medications including NSAIDs and iodinated intravenous contrast exposure unless the latter is absolutely indicated.  Preferred narcotic agents for pain control are hydromorphone, fentanyl, and methadone. Morphine should not be used. Avoid Baclofen and avoid oral sodium phosphate and magnesium citrate based laxatives / bowel preps. Continue strict Input and Output monitoring. Will monitor the patient closely with you and intervene or adjust therapy as indicated by changes in clinical status/labs   Acute decompensated biventricular failure -Cardio on board, status post RHC 01/19/2021.  On Lasix and milrinone drips. CVVHD today for UF -Palliative on board  Afib w/ RVR -on amio gtt -tee guided dccv once chf has improved  Acute on chronic hypoxic and hypercapnic respiratory failure, history of OHS,  COPD -Also with acute pulmonary edema -Pulmonology on board  Hypotension, history of hypertension -Likely secondary to poor cardiac output  DM2 with hyperglycemia -Management per primary service  Anemia of chronic disease and chronic IDA - transfuse for hemoglobin less than 7, per primary  Subjective:   No acute events, still significantly volume up. There was an issue transferring her to [place], awaiting for my input per staff? Nonetheless, I discussed CRRT trial with patient and daughter and they both agree with giving this a try which is reasonable. In regards to long-term HD, we did discuss the implications of this and the impact it would have on the quality of life given her other co-morbidities. She wishes to just deal with CRRT and does not want to think about long term plan as of right now. When I asked her about her breathing and how she thinks it is, she keeps replying by saying "I don't know." Understandably, she is emotional and overwhelmed.   Objective:   BP (!) 108/59   Pulse (!) 112   Temp (!) 97.4 F (36.3 C) (Oral)   Resp (!) 24   Ht _0  (1.575 m)   Wt 98.5 kg   SpO2 95%   BMI 39.72 kg/m   Intake/Output Summary (Last 24 hours) at 01/20/2021 0956 Last data filed at 01/20/2021 4383 Gross per 24 hour  Intake 2158.39 ml  Output 1500 ml  Net 658.39 ml   Weight change: -0.3 kg  Physical Exam: Gen:nad, sitting up in bed CVS: Tachycardic, irregularly irregular Resp: slightly diminished air entry bibasilar posteriorly Abd: Obese, soft, nontender Ext: 2+ pitting edema bilateral lower extremities Neuro: Speech and coherent, moves all extremities spontaneously  Imaging: CARDIAC CATHETERIZATION  Result Date: 01/21/2021  Hemodynamic findings consistent with severe pulmonary hypertension. Mean PAP 46-48 mmHg  Moderately reduced cardiac output of index with PA sat of 50%; CO-CI of 4.37-2.21  Patient still remains significant volume overloaded with elevated PA  pressures.  Unfortunately her anatomy did not allow [place] to complete the full right heart catheterization with wedge pressure.  If absolutely indicated in the future, would consider left brachial versus IJ access.  However the patient did not seem to be interested in having another procedure. Glenetta Hew, MD  US RENAL  Result Date: 01/06/2021 CLINICAL DATA:  Acute kidney injury. EXAM: RENAL / URINARY TRACT ULTRASOUND COMPLETE COMPARISON:  Abdominal ultrasound 10/23/2020, abdominal CT 04/17/2020 FINDINGS: Right Kidney: Renal measurements: 12.2 x 5.7 x 6.1 cm = volume: 220 mL. No hydronephrosis. Mild renal parenchymal thinning and increased echogenicity. Trace perinephric fluid about the lower pole. Hypoechoic lesion with low level internal echoes in the mid kidney measures 1.7 x 1.5 x 1.5 cm. This is unchanged in size from prior exam. Left Kidney: Renal measurements: 11.4 x 6.2 x 5.6 cm = volume: 206 mL. No hydronephrosis. Mild renal parenchymal thinning and increased parenchymal echogenicity. No focal lesion or evidence of stone. Bladder: Appears normal for degree of bladder distention. Other: None. IMPRESSION: 1. No obstructive uropathy. 2. Mild thinning of the renal parenchyma with increased echogenicity consistent with chronic medical renal disease. 3. Trace right perinephric fluid about the inferior pole. 4. Hypoechoic lesion in the right kidney is unchanged in size from prior exam measuring 1.7 cm, likely a complex/hemorrhagic cyst. Electronically Signed   By: Keith Rake M.D.   On: 01/13/2021 23:27    Labs: BMET Recent Labs  Lab 01/23/2021 1904 01/03/2021 2104 01/16/21 0437 01/17/21 0318 01/01/2021 0403 01/12/2021 1543 01/19/21 0223 01/20/21 0414  NA 137  --  137 137 136 139  139 136 133*  K 5.7* 5.0 4.6 4.4 4.0 3.8  3.9 3.8 3.5  CL 97*  --  97* 96* 93*  --  94* 92*  CO2 31  --  31 33* 32  --  33* 27  GLUCOSE 223*  --  190* 143* 157*  --  183* 203*  BUN 60*  --  63* 73* 81*  --  88* 98*   CREATININE 2.18*  --  2.16* 2.81* 3.00*  --  3.14* 3.54*  CALCIUM 9.4  --  9.2 8.9 8.5*  --  8.5* 8.2*  PHOS  --   --   --   --   --   --  6.6*  --    CBC Recent Labs  Lab 01/17/2021 1904 01/16/21 0437 01/17/21 0318 01/03/2021 1543 01/19/21 0928 01/20/21 0414  WBC 5.8 6.0 5.6  --  5.5 6.5  NEUTROABS 5.3  --  4.2  --   --   --   HGB 10.2* 9.4* 9.4* 9.9*  10.2* 9.5* 9.0*  HCT 34.2* 31.6* 29.9* 29.0*  30.0* 31.8* 28.2*  MCV 95.3 97.2 94.0  --  94.9 91.9  PLT 150 138* 133*  --  126* 126*    Medications:    . apixaban  2.5 mg Oral BID  . fluticasone furoate-vilanterol  1 puff Inhalation Daily   And  . umeclidinium bromide  1 puff Inhalation Daily  . insulin aspart  0-5 Units Subcutaneous QHS  . insulin aspart  0-9 Units Subcutaneous TID WC  . levothyroxine  88 mcg Oral Q0600  . metolazone  5 mg Oral BID  . midodrine  10 mg Oral TID WC  . pantoprazole  40 mg Oral BID  .  QUEtiapine  12.5 mg Oral QHS  . rosuvastatin  10 mg Oral Daily  . sodium chloride flush  3 mL Intravenous Q12H  . sodium chloride flush  3 mL Intravenous Q12H      Gean Quint, MD [name] 01/20/2021, 9:56 AM

## 2021-01-20 NOTE — Progress Notes (Signed)
ANTICOAGULATION CONSULT NOTE - Initial Consult  Pharmacy Consult for heparin  Indication: atrial fibrillation  Allergies  Allergen Reactions  . Codeine Other (See Comments)    "Spaces out" the patient and she cannot walk well  . Vesicare [Solifenacin] Itching    Patient Measurements: Height: 5' 2" (157.5 cm) Weight: 98.5 kg (217 lb 2.5 oz) IBW/kg (Calculated) : 50.1 HEPARIN DW (KG): 74.7   Vital Signs: Temp: 97.7 F (36.5 C) (03/27 1615) Temp Source: Oral (03/27 1615) BP: 121/91 (03/27 1830) Pulse Rate: 122 (03/27 1830)  Labs: Recent Labs    01/12/2021 1543 01/19/21 0223 01/19/21 0928 01/20/21 0414 01/20/21 1638  HGB 9.9*  10.2*  --  9.5* 9.0*  --   HCT 29.0*  30.0*  --  31.8* 28.2*  --   PLT  --   --  126* 126*  --   CREATININE  --  3.14*  --  3.54* 2.74*    Estimated Creatinine Clearance: 17.4 mL/min (A) (by C-G formula based on SCr of 2.74 mg/dL (H)).   Medical History: Past Medical History:  Diagnosis Date  . Allergy   . Anemia, unspecified   . Arthritis   . Asthma   . Atrial fibrillation status post cardioversion [facility]) 09/2015   s/p TEE/DCCV>>SR on amio  . Benign neoplasm of colon   . Carpal tunnel syndrome   . Cellulitis and abscess of finger, unspecified   . Cerumen impaction   . Cervicalgia   . CHF (congestive heart failure) ([place])   . Chronic airway obstruction, not elsewhere classified   . Chronic anticoagulation 09/2015   Eliquis  . Diarrhea   . Diffuse cystic mastopathy   . Edema   . Encounter for long-term (current) use of other medications   . GERD (gastroesophageal reflux disease)   . Heart murmur   . Lumbago   . Neck pain 05/23/2015  . Obesity, unspecified   . Other and unspecified hyperlipidemia   . Other psoriasis   . Pain in joint, lower leg   . PONV (postoperative nausea and vomiting)   . Primary localized osteoarthrosis of right shoulder 12/18/2015  . 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 Scoliosis (and kyphoscoliosis), idiopathic   . Shortness of breath dyspnea    with exertion  . Type II or unspecified type diabetes mellitus without mention of complication, uncontrolled   . Unspecified essential hypertension   . Unspecified hemorrhoids without mention of complication   . Unspecified hypothyroidism   . Urge incontinence     Medications:  Medications Prior to Admission  Medication Sig Dispense Refill Last Dose  . acetaminophen (TYLENOL) 650 MG CR tablet Take 1,300 mg by mouth every 8 (eight) hours as needed for pain.   Past Month at Unknown time  . albuterol (PROVENTIL) (2.5 MG/3ML) 0.083% nebulizer solution USE 1 VIAL IN NEBULIZER EVERY 4 HOURS AND AS NEEDED. Generic: VENTOLIN (Patient taking differently: Take 2.5 mg by nebulization every 4 (four) hours as needed for shortness of breath or wheezing.) 150 mL 3 Past Month at Unknown time  . albuterol (VENTOLIN HFA) 108 (90 Base) MCG/ACT inhaler INHALE 2 PUFFS INTO THE LUNGS EVERY 6 HOURS AS NEEDED FOR WHEEZING OR SHORTNESS OF BREATH (Patient taking differently: Inhale 2 puffs into the lungs every 6 (six) hours as needed for shortness of breath or wheezing.) 6.7 g 3 Past Month at Unknown time  .  amiodarone (PACERONE) 200 MG tablet Take 1 tablet (200 mg total) by mouth daily. 30 tablet 11 01/17/2021 at Unknown time  . azelastine (ASTELIN) 0.1 % nasal spray Place 1 spray into both nostrils 2 (two) times daily. Use in each nostril as directed (Patient taking differently: Place 1 spray into both nostrils 2 (two) times daily as needed. Use in each nostril as directed) 30 mL 1 Past Month at Unknown time  . Blood Glucose Monitoring Suppl (ONETOUCH VERIO) w/Device KIT Check blood sugar twice daily DX:E11.9 1 kit 0   . cetirizine (ZYRTEC) 10 MG tablet Take 10 mg by mouth daily as needed for allergies.    unk  . colestipol (COLESTID) 1 g tablet  Take 1 g by mouth 2 (two) times daily.   01/10/2021 at Unknown time  . Dextromethorphan-guaiFENesin (MUCINEX DM) 30-600 MG TB12 Take 2 tablets by mouth as needed (cough).   Past Month at Unknown time  . doxazosin (CARDURA) 4 MG tablet TAKE 1 TABLET BY MOUTH EVERY DAY TO HELP CONTROL BLOOD PRESSURE (Patient taking differently: Take 4 mg by mouth daily.) 90 tablet 1 12/28/2020 at Unknown time  . ferrous sulfate 325 (65 FE) MG EC tablet Take 325 mg by mouth 2 (two) times daily.   01/02/2021 at Unknown time  . furosemide (LASIX) 40 MG tablet TAKE 2 TABLETS BY MOUTH DAILY AND 1 TABLET BY MOUTH AT LUNCH on  Mondays, Wednesdays and Fridays (Patient taking differently: Take 40-80 mg by mouth See admin instructions. 2 tablets in the morning  daily. Take 1 tablet at lunch on m,w,f in addition to morning dose) 270 tablet 1 12/31/2020 at Unknown time  . glimepiride (AMARYL) 1 MG tablet TAKE 1 TABLET(1 MG) BY MOUTH DAILY WITH BREAKFAST (Patient taking differently: Take 1 mg by mouth daily with breakfast.) 90 tablet 1 01/14/2021 at Unknown time  . glucose blood test strip 1 each by Other route 2 (two) times daily. OneTouch Verio DX E11.8 200 each 11   . JANUVIA 50 MG tablet TAKE 1 TABLET(50 MG) BY MOUTH DAILY (Patient taking differently: Take 50 mg by mouth daily.) 30 tablet 6 01/20/2021 at Unknown time  . Lancets (ONETOUCH ULTRASOFT) lancets 1 each by Other route 2 (two) times daily. OneTouch Verio DX E11.9 200 each 11   . levothyroxine (SYNTHROID) 88 MCG tablet TAKE 1 TABLET BY MOUTH DAILY ON AN EMPTY STOMACH (Patient taking differently: Take 88 mcg by mouth daily before breakfast.) 90 tablet 1 01/12/2021 at Unknown time  . OVER THE COUNTER MEDICATION Place 1 drop into both eyes daily. [facility] brand lubricating eye drop   01/03/2021 at Unknown time  . OXYGEN Inhale 2 L into the lungs continuous. Pt also uses 4L pulse via POC     . pantoprazole (PROTONIX) 40 MG tablet TAKE 1 TABLET(40 MG) BY MOUTH TWICE DAILY (Patient  taking differently: Take 40 mg by mouth 2 (two) times daily.) 180 tablet 1 01/21/2021 at Unknown time  . potassium gluconate 595 (99 K) MG TABS tablet Take 595 mg by mouth 2 (two) times daily.   01/01/2021 at Unknown time  . predniSONE (DELTASONE) 10 MG tablet Take 4 tabs po daily x 3 days; then 3 tabs daily x3 days; then 2 tabs daily x3 days; then 1 tab daily x 3 days; then stop 30 tablet 0 12/27/2020 at Unknown time  . rosuvastatin (CRESTOR) 10 MG tablet TAKE 1 TABLET(10 MG) BY MOUTH DAILY (Patient taking differently: Take 10 mg by mouth daily.) 90 tablet  0 01/14/2021 at Unknown time  . TRELEGY ELLIPTA 200-62.5-25 MCG/INH AEPB INHALE 1 PUFF INTO THE LUNGS DAILY 60 each 6 12/30/2020 at Unknown time  . cholecalciferol (VITAMIN D) 1000 units tablet Take 1,000 Units by mouth at bedtime.    01/14/2021   Scheduled:  . Chlorhexidine Gluconate Cloth  6 each Topical Daily  . fluticasone furoate-vilanterol  1 puff Inhalation Daily   And  . umeclidinium bromide  1 puff Inhalation Daily  . insulin aspart  0-5 Units Subcutaneous QHS  . insulin aspart  0-9 Units Subcutaneous TID WC  . levothyroxine  88 mcg Oral Q0600  . metolazone  5 mg Oral BID  . midodrine  10 mg Oral TID WC  . pantoprazole  40 mg Oral BID  . QUEtiapine  12.5 mg Oral QHS  . rosuvastatin  10 mg Oral Daily  . sodium chloride flush  3 mL Intravenous Q12H  . sodium chloride flush  3 mL Intravenous Q12H    Assessment: 85 yo female with HF and AKI noted on CRRT. She was on apixaban (last dose 3/26 at ~ 6pm) and pharmacy to start heparin  -hg= 8, plt= 126 -SCr= 2.74  Goal of Therapy:  Heparin level 0.3-0.7 units/ml aPTT 66-102 seconds Monitor platelets by anticoagulation protocol: Yes   Plan:  -No heparin bolus -Start heparin at 1000 units/hr -heparin level and aPTT in 8 hrs -Daily heparin level, aPTT and CBC   Hildred Laser, PharmD Clinical Pharmacist **Pharmacist phone directory can now be found on [email or web address] ([email or web address]).  Listed  under [place].

## 2021-01-20 NOTE — Progress Notes (Signed)
PT Cancellation Note  Patient Details Name: Patricia [name] MRN: [id number] DOB: 05-Aug-1936   Cancelled Treatment:    Reason Eval/Treat Not Completed: Medical issues which prohibited therapy. Pt transferred to ICU, initiating CRRT. PT will follow up tomorrow to assess for appropriateness for mobility.   Zenaida Niece 01/20/2021, 2:52 PM

## 2021-01-20 NOTE — Progress Notes (Signed)
NAME:  Patricia [name], MRN:  [id number], DOB:  18-Jun-1936, LOS: 4 ADMISSION DATE:  01/23/2021, CONSULTATION DATE:  01/17/21 REFERRING MD:  Starla Link - [facility], CHIEF COMPLAINT:  SOB  History of Present Illness:  85 yo F PMH OHS, CKD IV, Afib, COPD, chronic hypoxic failure on home 2L O2, s/p TAVR, CAD HFrEF who presented to ED 3/22 for SOB. Associated hypoxia with patient noting sats 88% on 2L, bilateral lower extremity pitting edema, and fluctuating elevated HR. BNP 1681, CXR with bilateral pleural effusions and pulmonary edema. Admitted to [facility] for acute heart failure exacerbation. Got 8m lasix in ED, and total of 1442mLasix 3/23 without a robust response. ECHO obtained 3/23 which revealed LVEF 25-30%, LVH, reduced RV systolic function but normal RV size. Severely dilated LA. Moderately dilated RA.    3/24 Cr has bumped and lasix being held. Cardiology suggests PCCM consult to evaluate SOB for pulmonary etiology of SOB. 3/26: Transfer to ICU for initiation of CRRT Pertinent  Medical History  OHS Moderate COPD  Chronic hypoxic respiratory failure Systolic heart failure Pulmonary hypertension  Afib AS s/p TAVR CAD DM2 CKD IV Hypothyroidism  Significant Hospital Events: Including procedures, antibiotic start and stop dates in addition to other pertinent events   . [facility] w pulm edema, pleural effusions. BNP 1600. Got 40 lasix . 3/23 cards consulted. ECHO with worse EF (25% from prior 40%) and new RV dysfunction. got higher dose lasix did not have robust response.  . 3/24 Creatinine bumped, lasix held. pulm consulted for SOB.  . 3/26 transferred to ICU for CRRT, HD catheter placed  Interim History / Subjective:  Patient continued to have uptrending serum creatinine, she looks visibly short of breath, heart failure team suggested to transfer her to ICU for CRRT that will help remove volume  Objective   Blood pressure (!) 108/59, pulse (!) 112, temperature (!) 97.4  F (36.3 C), temperature source Oral, resp. rate (!) 24, height _0  (1.575 m), weight 98.5 kg, SpO2 95 %.        Intake/Output Summary (Last 24 hours) at 01/20/2021 1413 Last data filed at 01/20/2021 0849 Gross per 24 hour  Intake 1921.39 ml  Output 1500 ml  Net 421.39 ml   Filed Weights   12/25/2020 0030 01/19/21 0245 01/20/21 0000  Weight: 97.9 kg 98.8 kg 98.5 kg    Examination: Constitutional: Elderly Caucasian female, sitting on the bed looks short of breath Eyes: eomi, pupils equal Ears, nose, mouth, and throat: Positive JVD, MMM Cardiovascular: irregular ext warm Respiratory: diminished due to body habitus, normal rate, no wheezes Gastrointestinal: soft, hypoactive BS Skin: No rashes, normal turgor Neurologic: moves all 4 ext to command  BUN/Cr creeping up  Labs/imaging that I havepersonally reviewed   BUN 88 Cr 3.54   Resolved Hospital Problem list     Assessment & Plan:  Groups 2/3 pulmonary hypertension. Acute on chronic biventricular heart failure AKI on CKD4 question cardiorenal Afib/RVR with LBBB on amio Chronic hypoxemia and OHS Volume overloaded state of heart Anemia of critical illness Diabetes type 2 with hyperglycemia Acute hypoxic respiratory failure due to volume overload/pulmonary edema  Patient remained in positive fluid balance despite aggressive diuresis with Lasix infusion Further diuresis and inotropes per CHF team Currently on Lasix 20 mg/h, metolazone and milrinone Patient serum creatinine is uptrending, HD catheter was placed per nephrology Patient will be started on CRRT Monitor electrolytes and supplement Patient's heart rate is well controlled Continue  apixaban for stroke prophylaxis Monitor H&H currently stable, threshold for transfusion if less than 7 Continue oxygen via nasal cannula to maintain O2 sat 88-92% On sliding scale insulin with a goal fingerstick 140-180    Total critical care time: 35 minutes  Performed by:  [place] care time was exclusive of separately billable procedures and treating other patients.   Critical care was necessary to treat or prevent imminent or life-threatening deterioration.   Critical care was time spent personally by me on the following activities: development of treatment plan with patient and/or surrogate as well as nursing, discussions with consultants, evaluation of patient's response to treatment, examination of patient, obtaining history from patient or surrogate, ordering and performing treatments and interventions, ordering and review of laboratory studies, ordering and review of radiographic studies, pulse oximetry and re-evaluation of patient's condition.   Jacky Kindle MD [facility] See Amion for pager If no response to pager, please call [phone] until 7pm After 7pm, Please call E-link [phone]

## 2021-01-21 ENCOUNTER — Encounter (HOSPITAL_COMMUNITY): Payer: Self-pay | Admitting: Cardiology

## 2021-01-21 DIAGNOSIS — I5023 Acute on chronic systolic (congestive) heart failure: Secondary | ICD-10-CM | POA: Diagnosis not present

## 2021-01-21 DIAGNOSIS — N179 Acute kidney failure, unspecified: Secondary | ICD-10-CM | POA: Diagnosis not present

## 2021-01-21 DIAGNOSIS — L89159 Pressure ulcer of sacral region, unspecified stage: Secondary | ICD-10-CM

## 2021-01-21 DIAGNOSIS — N1832 Chronic kidney disease, stage 3b: Secondary | ICD-10-CM | POA: Diagnosis not present

## 2021-01-21 DIAGNOSIS — I2510 Atherosclerotic heart disease of native coronary artery without angina pectoris: Secondary | ICD-10-CM | POA: Diagnosis not present

## 2021-01-21 LAB — CBC
HCT: 30 % — ABNORMAL LOW (ref 36.0–46.0)
Hemoglobin: 9.3 g/dL — ABNORMAL LOW (ref 12.0–15.0)
MCH: 29.1 pg (ref 26.0–34.0)
MCHC: 31 g/dL (ref 30.0–36.0)
MCV: 93.8 fL (ref 80.0–100.0)
Platelets: 144 10*3/uL — ABNORMAL LOW (ref 150–400)
RBC: 3.2 MIL/uL — ABNORMAL LOW (ref 3.87–5.11)
RDW: 15.9 % — ABNORMAL HIGH (ref 11.5–15.5)
WBC: 6.4 10*3/uL (ref 4.0–10.5)
nRBC: 0 % (ref 0.0–0.2)

## 2021-01-21 LAB — RENAL FUNCTION PANEL
Albumin: 3.2 g/dL — ABNORMAL LOW (ref 3.5–5.0)
Albumin: 3.3 g/dL — ABNORMAL LOW (ref 3.5–5.0)
Anion gap: 9 (ref 5–15)
Anion gap: 6 (ref 5–15)
BUN: 45 mg/dL — ABNORMAL HIGH (ref 8–23)
BUN: 29 mg/dL — ABNORMAL HIGH (ref 8–23)
CO2: 29 mmol/L (ref 22–32)
CO2: 29 mmol/L (ref 22–32)
Calcium: 8.1 mg/dL — ABNORMAL LOW (ref 8.9–10.3)
Calcium: 8.1 mg/dL — ABNORMAL LOW (ref 8.9–10.3)
Chloride: 96 mmol/L — ABNORMAL LOW (ref 98–111)
Chloride: 95 mmol/L — ABNORMAL LOW (ref 98–111)
Creatinine, Ser: 2.03 mg/dL — ABNORMAL HIGH (ref 0.44–1.00)
Creatinine, Ser: 1.63 mg/dL — ABNORMAL HIGH (ref 0.44–1.00)
GFR, Estimated: 24 mL/min — ABNORMAL LOW (ref >60)
GFR, Estimated: 31 mL/min — ABNORMAL LOW (ref >60)
Glucose, Bld: 172 mg/dL — ABNORMAL HIGH (ref 70–99)
Glucose, Bld: 223 mg/dL — ABNORMAL HIGH (ref 70–99)
Phosphorus: 2.9 mg/dL (ref 2.5–4.6)
Phosphorus: 2.3 mg/dL — ABNORMAL LOW (ref 2.5–4.6)
Potassium: 3.6 mmol/L (ref 3.5–5.1)
Potassium: 4 mmol/L (ref 3.5–5.1)
Sodium: 134 mmol/L — ABNORMAL LOW (ref 135–145)
Sodium: 130 mmol/L — ABNORMAL LOW (ref 135–145)

## 2021-01-21 LAB — HEPARIN LEVEL (UNFRACTIONATED): Heparin Unfractionated: 1 IU/mL — ABNORMAL HIGH (ref 0.30–0.70)

## 2021-01-21 LAB — MRSA PCR SCREENING: MRSA by PCR: NEGATIVE

## 2021-01-21 LAB — APTT
aPTT: 46 seconds — ABNORMAL HIGH (ref 24–36)
aPTT: 53 seconds — ABNORMAL HIGH (ref 24–36)

## 2021-01-21 LAB — GLUCOSE, CAPILLARY
Glucose-Capillary: 173 mg/dL — ABNORMAL HIGH (ref 70–99)
Glucose-Capillary: 209 mg/dL — ABNORMAL HIGH (ref 70–99)
Glucose-Capillary: 219 mg/dL — ABNORMAL HIGH (ref 70–99)
Glucose-Capillary: 219 mg/dL — ABNORMAL HIGH (ref 70–99)

## 2021-01-21 LAB — COOXEMETRY PANEL
Carboxyhemoglobin: 1.2 % (ref 0.5–1.5)
Methemoglobin: 0.9 % (ref 0.0–1.5)
O2 Saturation: 71.8 %
Total hemoglobin: 9.2 g/dL — ABNORMAL LOW (ref 12.0–16.0)

## 2021-01-21 LAB — MAGNESIUM: Magnesium: 2.1 mg/dL (ref 1.7–2.4)

## 2021-01-21 MED ORDER — NOREPINEPHRINE 16 MG/250ML-% IV SOLN
0.0000 ug/min | INTRAVENOUS | Status: DC
  Administered 2021-01-22: 10 ug/min via INTRAVENOUS
  Administered 2021-01-22: 8 ug/min via INTRAVENOUS
  Administered 2021-01-23: 4 ug/min via INTRAVENOUS
  Filled 2021-01-21 (×4): qty 250

## 2021-01-21 MED ORDER — SODIUM CHLORIDE 0.9% FLUSH
10.0000 mL | Freq: Two times a day (BID) | INTRAVENOUS | Status: DC
  Administered 2021-01-23 – 2021-01-25 (×2): 10 mL

## 2021-01-21 MED ORDER — SODIUM CHLORIDE 0.9% FLUSH: 10.0000 mL | INTRAVENOUS | Status: DC | PRN

## 2021-01-21 MED ORDER — TRAMADOL HCL 50 MG PO TABS
50.0000 mg | ORAL_TABLET | Freq: Four times a day (QID) | ORAL | Status: DC | PRN
  Administered 2021-01-21 – 2021-01-23 (×5): 50 mg via ORAL
  Filled 2021-01-21 (×5): qty 1

## 2021-01-21 NOTE — Progress Notes (Signed)
Patient ID: Patricia [name], female   DOB: [birth year], 85 y.o.   MRN: [id number] P    Advanced Heart Failure Rounding Note  PCP-Cardiologist: [name] Martinique, MD   Subjective:    CVVH started yesterday, weight down 4 lbs.  Pulling UF 50-100 cc/hr.  Co-ox 72% with CVP 15-16.   Not short of breath.  Uncomfortable sitting in chair.    Objective:   Weight Range: 97 kg Body mass index is 39.11 kg/m.   Vital Signs:   Temp:  [96.9 F (36.1 C)-98 F (36.7 C)] 97.3 F (36.3 C) (03/28 0827) Pulse Rate:  [75-139] 105 (03/28 0835) Resp:  [16-28] 27 (03/28 0835) BP: (76-122)/(35-91) 120/73 (03/28 0800) SpO2:  [90 %-100 %] 99 % (03/28 0835) Weight:  [97 kg] 97 kg (03/28 0530) Last BM Date: 01/14/2021  Weight change: Filed Weights   01/19/21 0245 01/20/21 0000 01/21/21 0530  Weight: 98.8 kg 98.5 kg 97 kg    Intake/Output:   Intake/Output Summary (Last 24 hours) at 01/21/2021 0907 Last data filed at 01/21/2021 0800 Gross per 24 hour  Intake 1959.49 ml  Output 2836 ml  Net -876.51 ml      Physical Exam    General: NAD Neck: JVP 14 cm, no thyromegaly or thyroid nodule.  Lungs: Clear to auscultation bilaterally with normal respiratory effort. CV: Nondisplaced PMI.  Heart mildly tachy, irregular S1/S2, no S3/S4, no murmur.  1+ ankle edema.  Abdomen: Soft, nontender, no hepatosplenomegaly, no distention.  Skin: Intact without lesions or rashes.  Neurologic: Alert and oriented x 3.  Psych: Normal affect. Extremities: No clubbing or cyanosis.  HEENT: Normal.    Telemetry   AF 100s (personally reviewed)  Labs    CBC Recent Labs    01/20/21 0414 01/21/21 0357  WBC 6.5 6.4  HGB 9.0* 9.3*  HCT 28.2* 30.0*  MCV 91.9 93.8  PLT 126* 517*   Basic Metabolic Panel Recent Labs    01/20/21 0414 01/20/21 1638 01/21/21 0357  NA 133* 132* 134*  K 3.5 3.2* 3.6  CL 92* 92* 96*  CO2 27 32 29  GLUCOSE 203* 176* 172*  BUN 98* 77* 45*  CREATININE 3.54* 2.74* 2.03*  CALCIUM  8.2* 7.9* 8.1*  MG 1.9  --  2.1  PHOS  --  4.2 2.9   Liver Function Tests Recent Labs    01/20/21 1638 01/21/21 0357  ALBUMIN 3.3* 3.2*   No results for input(s): LIPASE, AMYLASE in the last 72 hours. Cardiac Enzymes No results for input(s): CKTOTAL, CKMB, CKMBINDEX, TROPONINI in the last 72 hours.  BNP: BNP (last 3 results) Recent Labs    09/17/20 1455 12/17/20 0022 01/09/2021 1904  BNP 572* 574.3* 1,681.6*    ProBNP (last 3 results) No results for input(s): PROBNP in the last 8760 hours.   D-Dimer No results for input(s): DDIMER in the last 72 hours. Hemoglobin A1C No results for input(s): HGBA1C in the last 72 hours. Fasting Lipid Panel No results for input(s): CHOL, HDL, LDLCALC, TRIG, CHOLHDL, LDLDIRECT in the last 72 hours. Thyroid Function Tests No results for input(s): TSH, T4TOTAL, T3FREE, THYROIDAB in the last 72 hours.  Invalid input(s): FREET3  Other results:   Imaging    DG CHEST PORT 1 VIEW  Result Date: 01/20/2021 CLINICAL DATA:  Central line placement EXAM: PORTABLE CHEST 1 VIEW COMPARISON:  01/17/2021 FINDINGS: A LEFT IJ central venous catheter is noted with tips overlying the mid-LOWER SVC. Cardiomegaly and aortic valve replacement noted. Pulmonary vascular congestion  with interstitial edema and RIGHT pleural effusion again identified. There may be a trace LEFT pleural effusion present. There is no evidence of pneumothorax. IMPRESSION: LEFT IJ central venous catheter with tips overlying the mid-LOWER SVC. No other significant change. No pneumothorax. Electronically Signed   By: Margarette Canada M.D.   On: 01/20/2021 14:13     Medications:     Scheduled Medications: . Chlorhexidine Gluconate Cloth  6 each Topical Daily  . fluticasone furoate-vilanterol  1 puff Inhalation Daily   And  . umeclidinium bromide  1 puff Inhalation Daily  . insulin aspart  0-5 Units Subcutaneous QHS  . insulin aspart  0-9 Units Subcutaneous TID WC  . levothyroxine  88  mcg Oral Q0600  . metolazone  5 mg Oral BID  . midodrine  10 mg Oral TID WC  . pantoprazole  40 mg Oral BID  . QUEtiapine  12.5 mg Oral QHS  . rosuvastatin  10 mg Oral Daily  . sodium chloride flush  10-40 mL Intracatheter Q12H  . sodium chloride flush  3 mL Intravenous Q12H  . sodium chloride flush  3 mL Intravenous Q12H    Infusions: .  prismasol BGK 4/2.5 500 mL/hr at 01/21/21 0016  .  prismasol BGK 4/2.5 300 mL/hr at 01/21/21 0730  . sodium chloride    . amiodarone 60 mg/hr (01/21/21 0800)  . heparin 1,150 Units/hr (01/21/21 0800)  . milrinone 0.25 mcg/kg/min (01/21/21 0800)  . norepinephrine (LEVOPHED) Adult infusion 7 mcg/min (01/21/21 0800)  . prismasol BGK 4/2.5 1,500 mL/hr at 01/21/21 0715    PRN Medications: sodium chloride, acetaminophen **OR** acetaminophen, albuterol, ALPRAZolam, azelastine, dextromethorphan-guaiFENesin, fentaNYL (SUBLIMAZE) injection, heparin, methocarbamol, Muscle Rub, ondansetron **OR** ondansetron (ZOFRAN) IV, sodium chloride flush, sodium chloride flush  Assessment/Plan   1. Acute on chronic systolic CHF: Cause of cardiomyopathy uncertain.  Patient has history of 1-vessel CAD with 75% OM3 stenosis on 11/17 cath.  Cannot rule out tachy-mediated CMP.  Echo this admission with EF 25-30%, severe LVH, moderately decreased RV systolic function, mild-moderate MR, stable TAVR valve with mean gradient 12 mmHg, IVC dilated. RHC with elevated RA pressure and PA pressure, unable to get PCWP, CI 2.2.   Diuresis has been attempted but progressed poorly on Lasix gtt 20 mg/hr + metolazone 5 bid with rising creatinine.  CVVH started 3/27.  Currently, UF 50-100 cc/hr net.  CVP 15-16 today with co-ox 72%.  Weight down.  - Continue CVVH today, UF gentle net negative 50-100 cc/hr is reasonable.  When volume optimized, will try again with Lasix.  Would likely not be a good long-term HD candidate.   - Decrease milrinone to 0.125, can continue NE to maintain MAP with CVVH.  -  Will aim for TEE-guided DCCV in the future to get her back into NSR but think she needs some fluid off first to hold NSR long-term.  - Has wide LBBB, may be CRT candidate down the road but would need significant stabilization.  2. CAD: LHC in 11/17 with 75% OM3.  Not candidate for coronary angiography at this point with AKI. No chest pain.  - Continue statin.  3. Atrial fibrillation: History of PAF, admitted here with AF/RVR, HR in 110s generally.  This likely contributes to CHF exacerbation. Has not been on anticoagulation due to history of recurrent GI bleeding.  HR remains elevated today on amiodarone gtt but better in 100s.  - Continue amiodarone 60 mg/hr.  - Now on heparin gtt.   - Hopefully will get to  TEE-guided DCCV once CHF is better managed.  Suspect she will be able to tolerate apixaban for at least a month or so post-DCCV.  4. AKI on CKD stage IIIb: Creatinine up to 3.58 with poor diuresis.  Suspect cardiorenal syndrome.  Poor response to high dose diuretics.  I think she would be a poor candidate for long-term HD with advanced HF, but reasonable to try short-term CVVH to see if she can stabilize with volume removal acutely.  - Nephrology following, CVVH as above. 5. H/o TAVR: Bioprosthetic AoV stable on 3/22 echo.  6. COPD, OHS: On home oxygen at baseline.   Palliative care following.  Long-term HD would be difficult.  CRITICAL CARE Performed by: [name]  Total critical care time: 35 minutes  Critical care time was exclusive of separately billable procedures and treating other patients.  Critical care was necessary to treat or prevent imminent or life-threatening deterioration.  Critical care was time spent personally by me on the following activities: development of treatment plan with patient and/or surrogate as well as nursing, discussions with consultants, evaluation of patient's response to treatment, examination of patient, obtaining history from patient or surrogate,  ordering and performing treatments and interventions, ordering and review of laboratory studies, ordering and review of radiographic studies, pulse oximetry and re-evaluation of patient's condition.     Length of Stay: [place], MD  01/21/2021, 9:07 AM  Advanced Heart Failure Team Pager [phone] (M-F; 7a - 5p)  Please contact [facility] for night-coverage after hours (5p -7a ) and weekends on [email or web address]

## 2021-01-21 NOTE — Progress Notes (Signed)
NAME:  Patricia [name], MRN:  [id number], DOB:  08-31-1936, LOS: 5 ADMISSION DATE:  01/03/2021, CONSULTATION DATE:  01/21/21 REFERRING MD:  Starla Link - TRH, CHIEF COMPLAINT:  SOB  HPI/course in hospital  85 yo F with a relevant PMH of OSH c/b COPD and hypoxia (baseline home O2 2L) with pulmonary HTN by RHC (3/25), CKD IV, Afib, CAD w/ HFref (3/23 EF: 25-30% vs recent baseline 40-45%), s/p TAVR (09/2016), T2DM, and hx of chronic GI bleed.   On admission, patient described increased SOB on 2L O2 at home, bilateral lower extremity pitting edema, and fluctuating/elevated HR. Describes process as "long time in making". BNP 1681, CXR with bilateral pleural effusions and pulmonary edema. Admitted to [facility] for acute heart failure exacerbation. Troponin trend at that time not c/w ACS. Got 13m lasix in ED, and total of 1435mLasix 3/23, lasix w/ metolazone, without a robust response. ECHO obtained 3/23 which revealed LVEF 25-30%, LVH, reduced RV systolic function but normal RV size. Severely dilated LA. Moderately dilated RA.     3/24 Cr has bumped and lasix being held. Cardiology suggests PCCM consult to evaluate SOB for pulmonary etiology of SOB. 3/26: Transfer to ICU for initiation of CRRT [facility] continue [facility] for kidney recover and euvolemia    Past Medical History   Past Medical History:  Diagnosis Date  . Allergy   . Anemia, unspecified   . Arthritis   . Asthma   . [facility]   s/p TEE/DCCV>>SR on amio  . Benign neoplasm of colon   . Carpal tunnel syndrome   . Cellulitis and abscess of finger, unspecified   . Cerumen impaction   . Cervicalgia   . [place]ive [place] failure) [place]  . Chronic airway obstruction, not elsewhere classified   . Chronic anticoagulation 09/2015   Eliquis  . Diarrhea   . Diffuse cystic mastopathy   . Edema   . Encounter for long-term (current) use of other medications   . GERD  (gastroesophageal reflux disease)   . Heart murmur   . Lumbago   . Neck pain 05/23/2015  . Obesity, unspecified   . Other and unspecified hyperlipidemia   . Other psoriasis   . Pain in joint, lower leg   . PONV (postoperative nausea and vomiting)   . Primary localized osteoarthrosis of right shoulder 12/18/2015  . 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 Scoliosis (and kyphoscoliosis), idiopathic   . Shortness of breath dyspnea    with exertion  . Type II or unspecified type diabetes mellitus without mention of complication, uncontrolled   . Unspecified essential hypertension   . Unspecified hemorrhoids without mention of complication   . Unspecified hypothyroidism   . Urge incontinence      Past Surgical History:  Procedure Laterality Date  . ABDOMINAL HYSTERECTOMY  1987   complete  . BREAST BIOPSY Right   . BREAST EXCISIONAL BIOPSY Left 2011  . BREAST EXCISIONAL BIOPSY Right    x2  . BREAST SURGERY     right breast 3 surgeries [phone]. CARDIAC CATHETERIZATION N/A 09/02/2016   Procedure: Right/Left Heart Cath and Coronary Angiography;  Surgeon: [name] M JoMartiniqueMD;  [place] [facility];  Service: Cardiovascular;  Laterality: N/A;  . CARDIOVERSION N/A 10/19/2015   Procedure: CARDIOVERSION;  Surgeon: BrLelon PerlaMD;  Location: [place];  Service: Cardiovascular;  Laterality: N/A;  . CARDIOVERSION N/A 11/13/2017   Procedure: CARDIOVERSION;  Surgeon: Jerline Pain, MD;  Location: [facility] ENDOSCOPY;  Service: Cardiovascular;  Laterality: N/A;  . CARDIOVERSION N/A 07/28/2019   Procedure: CARDIOVERSION;  Surgeon: Buford Dresser, MD;  Location: [place];  Service: Cardiovascular;  Laterality: N/A;  . [place]  . COLON SURGERY     2004,2007,2013.  3 times colon surgieres  . ESOPHAGOGASTRODUODENOSCOPY  (EGD) WITH PROPOFOL Left 03/13/2017   Procedure: ESOPHAGOGASTRODUODENOSCOPY (EGD) WITH PROPOFOL;  Surgeon: Ronnette Juniper, MD;  Location: [place];  Service: Gastroenterology;  Laterality: Left;  . ESOPHAGOGASTRODUODENOSCOPY (EGD) WITH PROPOFOL Left 01/22/2018   Procedure: ESOPHAGOGASTRODUODENOSCOPY (EGD) WITH PROPOFOL;  Surgeon: Arta Silence, MD;  Location: [facility] ENDOSCOPY;  Service: Endoscopy;  Laterality: Left;  . EXCISE LE MANDIBULAR LYMPH NODE T     DR C. NEWMAN  . FOOT SURGERY  1989  . GIVENS CAPSULE STUDY Left 01/23/2018   Procedure: GIVENS CAPSULE STUDY;  Surgeon: Wilford Corner, MD;  Location: [facility] ENDOSCOPY;  Service: Endoscopy;  Laterality: Left;  . LEFT SHOULDER ARTHROSCOPY    . 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 NEUROPLASTY / TRANSPOSITION MEDIAN NERVE AT CARPAL TUNNEL BILATERAL  05/2003  . RIGHT HEART CATH N/A 01/08/2021   Procedure: RIGHT HEART CATH;  Surgeon: Leonie Man, MD;  Location: [place] [facility] LAB;  Service: Cardiovascular;  Laterality: N/A;  . TEE WITHOUT CARDIOVERSION N/A 10/19/2015   Procedure: Transesophageal Echocardiogram (TEE) ;  Surgeon: Lelon Perla, MD;  Location[facility];  Service: Cardiovascular;  Laterality: N/A;  . TEE WITHOUT CARDIOVERSION N/A 10/14/2016   Procedure: TRANSESOPHAGEAL ECHOCARDIOGRAM (TEE);  Surgeon: Sherren Mocha, MD;  Location: [place];  Service: Open Heart Surgery;  Laterality: N/A;  . TEE WITHOUT CARDIOVERSION N/A 07/28/2019   Procedure: TRANSESOPHAGEAL ECHOCARDIOGRAM (TEE);  Surgeon: Buford Dresser, MD;  Location: [facility] ENDOSCOPY;  Service: Cardiovascular;  Laterality: N/A;  . TOTAL SHOULDER ARTHROPLASTY Right 12/18/2015   Procedure: TOTAL SHOULDER ARTHROPLASTY;  Surgeon: [name], MD;  Location: [place];  Service: Orthopedics;  Laterality: Right;  . TRANSCATHETER AORTIC VALVE REPLACEMENT, TRANSFEMORAL N/A 10/14/2016   Procedure: TRANSCATHETER AORTIC VALVE REPLACEMENT, TRANSFEMORAL;  Surgeon: Sherren Mocha, MD;   Location: [place];  Service: Open Heart Surgery;  Laterality: N/A;  . TRIGGER FINGER RELEASE Left   . VAGINAL CYST REMOVED  1967     Review of Systems:   ROS  Social History   reports that she quit smoking about 31 years ago. She has a 40.00 pack-year smoking history. She has never used smokeless tobacco. She reports that she does not drink alcohol and does not use drugs.   Family History   Her family history includes Arthritis in her sister; Asthma in her sister; Diabetes in her father; Heart disease in her brother and father; Liver cancer in her sister; Stroke in her father. There is no history of Breast cancer.   Allergies Allergies  Allergen Reactions  . Codeine Other (See Comments)    "Spaces out" the patient and she cannot walk well  . Vesicare [Solifenacin] Itching     Home Medications  Prior to Admission medications   Medication Sig Start Date End Date Taking? Authorizing Provider  acetaminophen (TYLENOL) 650 MG CR tablet Take 1,300 mg by mouth every 8 (eight) hours as needed for pain.   Yes [provider]  albuterol (PROVENTIL) (2.5 MG/3ML) 0.083% nebulizer solution USE 1 VIAL IN NEBULIZER EVERY  4 HOURS AND AS NEEDED. Generic: VENTOLIN Patient taking differently: Take 2.5 mg by nebulization every 4 (four) hours as needed for shortness of breath or wheezing. 07/12/20  Yes Brand Males, MD  albuterol (VENTOLIN HFA) 108 (90 Base) MCG/ACT inhaler INHALE 2 PUFFS INTO THE LUNGS EVERY 6 HOURS AS NEEDED FOR WHEEZING OR SHORTNESS OF BREATH Patient taking differently: Inhale 2 puffs into the lungs every 6 (six) hours as needed for shortness of breath or wheezing. 10/10/20  Yes [name], NP  amiodarone (PACERONE) 200 MG tablet Take 1 tablet (200 mg total) by mouth daily. 06/29/20  Yes Martinique, [name] M, MD  azelastine (ASTELIN) 0.1 % nasal spray Place 1 spray into both nostrils 2 (two) times daily. Use in each nostril as directed Patient taking differently: Place 1  spray into both nostrils 2 (two) times daily as needed. Use in each nostril as directed 03/02/20  Yes Martyn Ehrich, NP  Blood Glucose Monitoring Suppl [facility] VERIO) w/Device KIT Check blood sugar twice daily DX:E11.9 10/12/19  Yes [name], NP  cetirizine (ZYRTEC) 10 MG tablet Take 10 mg by mouth daily as needed for allergies.    Yes [provider]  colestipol (COLESTID) 1 g tablet Take 1 g by mouth 2 (two) times daily. 06/29/20  Yes [provider]  Dextromethorphan-guaiFENesin (MUCINEX DM) 30-600 MG TB12 Take 2 tablets by mouth as needed (cough).   Yes [provider]  doxazosin (CARDURA) 4 MG tablet TAKE 1 TABLET BY MOUTH EVERY DAY TO HELP CONTROL BLOOD PRESSURE Patient taking differently: Take 4 mg by mouth daily. 09/12/20  Yes [name], NP  ferrous sulfate 325 (65 FE) MG EC tablet Take 325 mg by mouth 2 (two) times daily.   Yes [provider]  furosemide (LASIX) 40 MG tablet TAKE 2 TABLETS BY MOUTH DAILY AND 1 TABLET BY MOUTH AT LUNCH on  Mondays, Wednesdays and Fridays Patient taking differently: Take 40-80 mg by mouth See admin instructions. 2 tablets in the morning  daily. Take 1 tablet at lunch on m,w,f in addition to morning dose 09/17/20  Yes Eubanks, Carlos American, NP  glimepiride (AMARYL) 1 MG tablet TAKE 1 TABLET(1 MG) BY MOUTH DAILY WITH BREAKFAST Patient taking differently: Take 1 mg by mouth daily with breakfast. 11/19/20  Yes [name], NP  glucose blood test strip 1 each by Other route 2 (two) times daily. OneTouch Verio DX E11.8 10/12/19  Yes Eubanks, Carlos American, NP  JANUVIA 50 MG tablet TAKE 1 TABLET(50 MG) BY MOUTH DAILY Patient taking differently: Take 50 mg by mouth daily. 10/10/20  Yes [name], NP  Lancets [facility] ULTRASOFT) lancets 1 each by Other route 2 (two) times daily. OneTouch Verio DX E11.9 10/12/19  Yes [name], NP  levothyroxine (SYNTHROID) 88 MCG tablet TAKE 1 TABLET BY MOUTH  DAILY ON AN EMPTY STOMACH Patient taking differently: Take 88 mcg by mouth daily before breakfast. 08/29/20  Yes Eubanks, Carlos American, NP  OVER THE COUNTER MEDICATION Place 1 drop into both eyes daily. [facility] brand lubricating eye drop   Yes [provider]  OXYGEN Inhale 2 L into the lungs continuous. Pt also uses 4L pulse via POC   Yes [provider]  pantoprazole (PROTONIX) 40 MG tablet TAKE 1 TABLET(40 MG) BY MOUTH TWICE DAILY Patient taking differently: Take 40 mg by mouth 2 (two) times daily. 07/11/20  Yes [name], NP  potassium gluconate 595 (99 K) MG TABS  tablet Take 595 mg by mouth 2 (two) times daily.   Yes [provider]  predniSONE (DELTASONE) 10 MG tablet Take 4 tabs po daily x 3 days; then 3 tabs daily x3 days; then 2 tabs daily x3 days; then 1 tab daily x 3 days; then stop 01/14/21  Yes Martyn Ehrich, NP  rosuvastatin (CRESTOR) 10 MG tablet TAKE 1 TABLET(10 MG) BY MOUTH DAILY Patient taking differently: Take 10 mg by mouth daily. 10/31/20  Yes [name], NP  TRELEGY ELLIPTA 200-62.5-25 MCG/INH AEPB INHALE 1 PUFF INTO THE LUNGS DAILY 10/15/20  Yes Martyn Ehrich, NP  cholecalciferol (VITAMIN D) 1000 units tablet Take 1,000 Units by mouth at bedtime.     [provider]     Interim history/subjective:  Patient reports improved SOB since admission. Describes some pain with lines and tape but does not otherwise have complaints. Patient tolerating repositioning, sitting upright in chair. Describes feeling very cold. Patient reports decreased appetite but per nurse at bedside, has consumed 25-50% of meals. No BM since 3/25 but patient passing flatus. Patient has urge but unable to stool this morning.  Nurse at bedside describes no new concerns.   Objective   Blood pressure 120/73, pulse (!) 105, temperature (!) 97.3 F (36.3 C), temperature source Oral, resp. rate (!) 27, height _0  (1.575 m), weight 97 kg, SpO2 99 %. CVP:   [13 mmHg-20 mmHg] 20 mmHg      Intake/Output Summary (Last 24 hours) at 01/21/2021 0926 Last data filed at 01/21/2021 0800 Gross per 24 hour  Intake 1959.49 ml  Output 2836 ml  Net -876.51 ml   Filed Weights   01/19/21 0245 01/20/21 0000 01/21/21 0530  Weight: 98.8 kg 98.5 kg 97 kg    Examination: Gen: NAD but appears uncomfortable and sick, sitting upright in chair at approximately 30 degrees, conversing with some difficulties 2/2 SOB, [place] in place, some evidence of deconditioning and weakness  HEENT: EOMI, MMM, anicteric sclera  CV: irregular S1/S2, no m/r/g, JVD present approximately to 10-12 cm, +1 peripheral edema with elastic skin Pulm: CTAB in upper fields, moving air appropriately  GI: NTND, soft Neuro: Moves extremities at least against gravity - not formally assessed  Skin: Central line in place w/out erythema, drainage, or induration, minimal blood on bandage, R cubital IV access in place w/out e/d/i    Ancillary tests (personally reviewed)  CBC: Recent Labs  Lab 01/14/2021 1904 01/16/21 0437 01/17/21 0318 01/07/2021 1543 01/19/21 0928 01/20/21 0414 01/21/21 0357  WBC 5.8 6.0 5.6  --  5.5 6.5 6.4  NEUTROABS 5.3  --  4.2  --   --   --   --   HGB 10.2* 9.4* 9.4* 9.9*  10.2* 9.5* 9.0* 9.3*  HCT 34.2* 31.6* 29.9* 29.0*  30.0* 31.8* 28.2* 30.0*  MCV 95.3 97.2 94.0  --  94.9 91.9 93.8  PLT 150 138* 133*  --  126* 126* 144*    Basic Metabolic Panel: Recent Labs  Lab 01/17/21 0318 01/21/2021 0403 01/21/2021 1543 01/19/21 0223 01/20/21 0414 01/20/21 1638 01/21/21 0357  NA 137 136 139  139 136 133* 132* 134*  K 4.4 4.0 3.8  3.9 3.8 3.5 3.2* 3.6  CL 96* 93*  --  94* 92* 92* 96*  CO2 33* 32  --  33* 27 32 29  GLUCOSE 143* 157*  --  183* 203* 176* 172*  BUN 73* 81*  --  88* 98* 77* 45*  CREATININE 2.81*  3.00*  --  3.14* 3.54* 2.74* 2.03*  CALCIUM 8.9 8.5*  --  8.5* 8.2* 7.9* 8.1*  MG 1.9 2.0  --  1.9 1.9  --  2.1  PHOS  --   --   --  6.6*  --  4.2 2.9    GFR: Estimated Creatinine Clearance: 23.2 mL/min (A) (by C-G formula based on SCr of 2.03 mg/dL (H)). Recent Labs  Lab 01/17/21 0318 01/19/21 0928 01/20/21 0414 01/21/21 0357  WBC 5.6 5.5 6.5 6.4    Liver Function Tests: Recent Labs  Lab 12/30/2020 1904 01/16/21 1656 01/19/21 0223 01/20/21 1638 01/21/21 0357  AST 29 14*  --   --   --   ALT 18 13  --   --   --   ALKPHOS 55 50  --   --   --   BILITOT 0.7 0.6  --   --   --   PROT 7.1 6.4*  --   --   --   ALBUMIN 3.8 3.4* 3.3* 3.3* 3.2*   No results for input(s): LIPASE, AMYLASE in the last 168 hours. No results for input(s): AMMONIA in the last 168 hours.  ABG    Component Value Date/Time   PHART 7.366 06/12/2020 1018   PCO2ART 57.9 (H) 06/12/2020 1018   PO2ART 84.9 06/12/2020 1018   HCO3 35.3 (H) 01/03/2021 1543   HCO3 34.5 (H) 01/07/2021 1543   TCO2 37 (H) 01/12/2021 1543   TCO2 37 (H) 12/29/2020 1543   O2SAT 71.8 01/21/2021 0357     Coagulation Profile: No results for input(s): INR, PROTIME in the last 168 hours.  Cardiac Enzymes: No results for input(s): CKTOTAL, CKMB, CKMBINDEX, TROPONINI in the last 168 hours.  HbA1C: Hgb A1c MFr Bld  Date/Time Value Ref Range Status  01/16/2021 04:37 AM 7.1 (H) 4.8 - 5.6 % Final    Comment:    (NOTE) Pre diabetes:          5.7%-6.4%  Diabetes:              >6.4%  Glycemic control for   <7.0% adults with diabetes   09/17/2020 12:00 AM 7.3 (H) <5.7 % of total Hgb Final    Comment:    For someone without known diabetes, a hemoglobin A1c value of 6.5% or greater indicates that they may have  diabetes and this should be confirmed with a follow-up  test. . For someone with known diabetes, a value <7% indicates  that their diabetes is well controlled and a value  greater than or equal to 7% indicates suboptimal  control. A1c targets should be individualized based on  duration of diabetes, age, comorbid conditions, and  other considerations. . Currently, no  consensus exists regarding use of hemoglobin A1c for diagnosis of diabetes for children. .     CBG: Recent Labs  Lab 01/20/21 0627 01/20/21 1400 01/20/21 1633 01/20/21 2211 01/21/21 0651  GLUCAP 225* 168* 172* 206* 173*     Assessment & Plan:  77 yoF in guarded condition that remains moderately ill with a relevant PMH of OSH c/b COPD and hypoxia (baseline home O2 2L) with pulmonary HTN by RHC (3/25), CKD IV, Afib, CAD w/ HFref (3/23 EF: 25-30% vs recent baseline 40-45%), s/p TAVR (09/2016), T2DM, and hx of chronic GI bleed.   AKI on CKD IV Anuric AKI likely multifactorial etiology with components of prerenal and cardiorenal syndrome given decompensated HFrEF c/b Afib w/ RVR as patient was hypervolemic but had evidence  of hypoperfusion (metabolic acidosis, intravascular depleted vs poor CO). CXR from yesterday still indicates pulmonary edema and volume overload is supported by continued JVD but body weight is decreasing. - Continue CRRT w/ CVVHD 300 mL/hr   - Goal to reach a euvolemic state   - Goal to remove CRRT if evidence of improved kidney function and transition back to Lasix - Maintain MAP >70 for optimal renal perfusion - Continue to trend CR, BUN, lactate; consider svO2 PRN   - Most recent svO2 w/in goal range  -Avoid further nephrotoxins including NSAIDS, Morphine. Unless absolutely necessary, avoid CT with contrast and/or MRI with gadolinium.    -Currently no indication for HD. However, not a good candidate long term HD.  - Nephrology following, appreciate recommendations   Acute Excaberation on Chronic HFrEF, s/p TAVR (2017) CHF excerebration likely multifactorial with considerations towards worsening Afib w/ RVR given patient's report of tachycardia at home as well of elements of more severe mitral regurgitation than can be appreciated on echo in decompensated CHF state.  - Wean NE as tolerated  - Continue w/ Milrinone at lower dosage, wean second as tolerated -  Continue midodrine  - Maintain MAP > 70  - Repeat BNP for trend  - Consider CXR tomorrow once patient closer to suspected dry weight  - Prior to d/c, consider optimization of T2DM medications with the DPP4-inhibitor Celesta Gentile) being substituted for a SGLT-2 inhibitor  - Cardiology following, appreciate recommendations   OSH, COPD, Acute on Chronic Hypercapnic Respiratory Failure  Baseline O2 requirement at home 2L. C/f potential VTE disease, but most likely cause of pulmonary HTN 2/2 CHF (class II) given echo demonstrated moderate MR and diastolic/systolic dysfunctoin, although RHC was not able to confirm via PCWP.  - Titrate O2 sats 88-92%  - Patient refused CPAP, CTM  - Outstanding NM pulmonary perfusion & ventilation scan prior to d/c - Continue Breo Ellipta and Incruse Ellipta  - Pulmonology following, appreciate recommendations  Afib w/ RVR -Continue Amioradione gtt - Per cards, suggest TEE guided DCCV once CHF has improved - Determine long-term anticoagulation goal due to d/c of anticoagulation 2/2 chronic GI bleed   Normocytic Anemia   Likely 2/2 chronic GI bleed, AICD, and CKD. Iron studies c/w AICD given elevated ferritin and low-normal TIBC (Fe wnl).  - Trend Hb  - Consider transfusion if <8 given need for optimal perfusion   Hx of GI Bleed - Occult chronic GI bleed with unidentified source by capsule (2019)  - Trend Hb  - CTM for evidence of change or acute hemorrhage while heparinized   Constipation   - No BM since 3/25, passing flatus  - Miralax PRN   C/f Sacral Decubitus Ulcer  - CTM rotations  - Wound NT consulted   Goals of Care  - Patient has expressed goal of seeing birth of great grandchild in October   - Palliative following, appreciate recommendations   Chronic Conditions T2DM - Continue SSI while in ICU  - Continue home rosuvastatin   Hypothyroidism - Continue home Levo     Daily Goals Checklist  Pain/Anxiety/Delirium protocol (if indicated):  Tramadol added for PRN pain  VAP protocol (if indicated): N/A Respiratory support goals: discussed Blood pressure target: MAP > 70, discussed above DVT prophylaxis: maintain heparin dose Nutritional status and feeding goals: appetite discussed with patient  GI prophylaxis: continue pantoprazole Fluid status goals: continue CRRT  Urinary catheter: Assessment of intravascular volume Central lines: in place, no signs of infection Glucose control: continue SSI  Mobility/therapy needs: continue as tolerated Antibiotic de-escalation: N/A Home medication reconciliation: To be optimized Daily labs: continue CBC, CMP Code Status: Full Family Communication: Discussed with family at bedside Disposition: TBD  Critical care time: 71 minutes      Domenick Bookbinder, MS4    Kipp Brood, MD [facility] ICU Physician [place]  Pager: [phone] Or Epic Secure Chat After hours: [phone].  01/21/2021, 9:26 AM     .

## 2021-01-21 NOTE — Progress Notes (Signed)
Physical Therapy Treatment Patient Details Name: Patricia [name] MRN: [id number] DOB: 02-Jun-1936 Today's Date: 01/21/2021    History of Present Illness 85 y.o. female admitted 01/01/2021 with worsening SOB and BLE edema. Pt with pulmonary edema and trace to small bilateral pleural effusions. CHF exacerbation with biventricular failure EF 25-30% and AKI with transition to ICU 3/27 with CRRT started. PMHx: HTN, DM, COPD, afib (not on anticoag due to GIB), anemia, s/p TAVR (2017), HLD    PT Comments    Pt eager to get off BSC on arrival and able to pivot with RW with tendency to keep eyes closed and max cues to open eyes and attend to task. Pt refused further stepping but agreeable to seated HEP and educated for need to maximize mobility if she is determined to return home. Encouraged OOB daily with nursing staff.  Pt on 4L throughout with SpO2 93-97% HR 104-122    Follow Up Recommendations  Home health PT;Supervision for mobility/OOB;CIR (pending progression)     Equipment Recommendations  None recommended by PT    Recommendations for Other Services       Precautions / Restrictions Precautions Precautions: Fall Precaution Comments: CRRT Restrictions Weight Bearing Restrictions: No    Mobility  Bed Mobility               General bed mobility comments: on BSC on arrival    Transfers Overall transfer level: Needs assistance   Transfers: Sit to/from Stand;Stand Pivot Transfers Sit to Stand: Mod assist Stand pivot transfers: Min assist       General transfer comment: mod assist to rise from recliner with knee blocked, RW present, cues for hand placement and assist for anterior translation and rise. Pivot BSC to recliner with use of RW with +2 for lines/safety. Pt denied further standing mobility  Ambulation/Gait             General Gait Details: pt denied attempting within CRRT line   Stairs             Wheelchair Mobility    Modified Rankin (Stroke  Patients Only)       Balance Overall balance assessment: Needs assistance   Sitting balance-Leahy Scale: Fair     Standing balance support: Bilateral upper extremity supported Standing balance-Leahy Scale: Poor Standing balance comment: bil UE support on RW in standing                            Cognition Arousal/Alertness: Awake/alert Behavior During Therapy: Flat affect Overall Cognitive Status: Within Functional Limits for tasks assessed                                        Exercises General Exercises - Lower Extremity Long Arc Quad: AROM;Both;15 reps;Seated Hip ABduction/ADduction: AAROM;Both;Seated;15 reps Hip Flexion/Marching: AROM;Both;15 reps;Seated Toe Raises: AROM;Both;Seated;15 reps Heel Raises: AROM;Both;Seated;15 reps    General Comments        Pertinent Vitals/Pain Pain Assessment: No/denies pain    Home Living                      Prior Function            PT Goals (current goals can now be found in the care plan section) Progress towards PT goals: Progressing toward goals    Frequency    Min 3X/week  PT Plan Discharge plan needs to be updated    Co-evaluation              AM-PAC PT "6 Clicks" Mobility   Outcome Measure  Help needed turning from your back to your side while in a flat bed without using bedrails?: A Little Help needed moving from lying on your back to sitting on the side of a flat bed without using bedrails?: A Little Help needed moving to and from a bed to a chair (including a wheelchair)?: A Lot Help needed standing up from a chair using your arms (e.g., wheelchair or bedside chair)?: A Lot Help needed to walk in hospital room?: A Lot Help needed climbing 3-5 steps with a railing? : Total 6 Click Score: 13    End of Session Equipment Utilized During Treatment: Oxygen Activity Tolerance: Patient tolerated treatment well Patient left: in chair;with call bell/phone  within reach;with nursing/sitter in room (no chair alarm as nursing had set up the chair and RN in room end of session and aware) Nurse Communication: Mobility status PT Visit Diagnosis: Unsteadiness on feet (R26.81);Muscle weakness (generalized) (M62.81);History of falling (Z91.81)     Time: 1505-6979 PT Time Calculation (min) (ACUTE ONLY): 17 min  Charges:  $Therapeutic Activity: 8-22 mins                     Damien Cisar P, PT Acute Rehabilitation Services Pager: [phone] Office: [phone]    Sandy Salaam Yusef Lamp 01/21/2021, 9:49 AM

## 2021-01-21 NOTE — Progress Notes (Signed)
[place] for heparin  Indication: atrial fibrillation  Allergies  Allergen Reactions  . Codeine Other (See Comments)    "Spaces out" the patient and she cannot walk well  . Vesicare [Solifenacin] Itching    Patient Measurements: Height: _0  (157.5 cm) Weight: 98.5 kg (217 lb 2.5 oz) IBW/kg (Calculated) : 50.1 HEPARIN DW (KG): 74.7   Vital Signs: Temp: 98 F (36.7 C) (03/28 0400) Temp Source: Oral (03/28 0400) BP: 90/58 (03/28 0430) Pulse Rate: 112 (03/28 0430)  Labs: Recent Labs    01/19/21 0223 01/19/21 0928 01/20/21 0414 01/20/21 1638 01/21/21 0357  HGB  --  9.5* 9.0*  --  9.3*  HCT  --  31.8* 28.2*  --  30.0*  PLT  --  126* 126*  --  144*  APTT  --   --   --   --  46*  HEPARINUNFRC  --   --   --   --  1.00*  CREATININE 3.14*  --  3.54* 2.74*  --     Estimated Creatinine Clearance: 17.4 mL/min (A) (by C-G formula based on SCr of 2.74 mg/dL (H)).   Medical History: Past Medical History:  Diagnosis Date  . Allergy   . Anemia, unspecified   . Arthritis   . Asthma   . Atrial fibrillation status post cardioversion [facility]) 09/2015   s/p TEE/DCCV>>SR on amio  . Benign neoplasm of colon   . Carpal tunnel syndrome   . Cellulitis and abscess of finger, unspecified   . Cerumen impaction   . Cervicalgia   . CHF (congestive heart failure) ([place])   . Chronic airway obstruction, not elsewhere classified   . Chronic anticoagulation 09/2015   Eliquis  . Diarrhea   . Diffuse cystic mastopathy   . Edema   . Encounter for long-term (current) use of other medications   . GERD (gastroesophageal reflux disease)   . Heart murmur   . Lumbago   . Neck pain 05/23/2015  . Obesity, unspecified   . Other and unspecified hyperlipidemia   . Other psoriasis   . Pain in joint, lower leg   . PONV (postoperative nausea and vomiting)   . Primary localized osteoarthrosis of right shoulder 12/18/2015  . S/P TAVR (transcatheter aortic valve  replacement) 10/14/2016   23 mm Edwards Sapien 3 transcatheter heart valve placed via percutaneous left transfemoral approach  . Scoliosis (and kyphoscoliosis), idiopathic   . Shortness of breath dyspnea    with exertion  . Type II or unspecified type diabetes mellitus without mention of complication, uncontrolled   . Unspecified essential hypertension   . Unspecified hemorrhoids without mention of complication   . Unspecified hypothyroidism   . Urge incontinence     Medications:  Medications Prior to Admission  Medication Sig Dispense Refill Last Dose  . acetaminophen (TYLENOL) 650 MG CR tablet Take 1,300 mg by mouth every 8 (eight) hours as needed for pain.   Past Month at Unknown time  . albuterol (PROVENTIL) (2.5 MG/3ML) 0.083% nebulizer solution USE 1 VIAL IN NEBULIZER EVERY 4 HOURS AND AS NEEDED. Generic: VENTOLIN (Patient taking differently: Take 2.5 mg by nebulization every 4 (four) hours as needed for shortness of breath or wheezing.) 150 mL 3 Past Month at Unknown time  . albuterol (VENTOLIN HFA) 108 (90 Base) MCG/ACT inhaler INHALE 2 PUFFS INTO THE LUNGS EVERY 6 HOURS AS NEEDED FOR WHEEZING OR SHORTNESS OF BREATH (Patient taking differently: Inhale 2 puffs into the  lungs every 6 (six) hours as needed for shortness of breath or wheezing.) 6.7 g 3 Past Month at Unknown time  . amiodarone (PACERONE) 200 MG tablet Take 1 tablet (200 mg total) by mouth daily. 30 tablet 11 01/04/2021 at Unknown time  . azelastine (ASTELIN) 0.1 % nasal spray Place 1 spray into both nostrils 2 (two) times daily. Use in each nostril as directed (Patient taking differently: Place 1 spray into both nostrils 2 (two) times daily as needed. Use in each nostril as directed) 30 mL 1 Past Month at Unknown time  . Blood Glucose Monitoring Suppl (ONETOUCH VERIO) w/Device KIT Check blood sugar twice daily DX:E11.9 1 kit 0   . cetirizine (ZYRTEC) 10 MG tablet Take 10 mg by mouth daily as needed for allergies.    unk  .  colestipol (COLESTID) 1 g tablet Take 1 g by mouth 2 (two) times daily.   01/22/2021 at Unknown time  . Dextromethorphan-guaiFENesin (MUCINEX DM) 30-600 MG TB12 Take 2 tablets by mouth as needed (cough).   Past Month at Unknown time  . doxazosin (CARDURA) 4 MG tablet TAKE 1 TABLET BY MOUTH EVERY DAY TO HELP CONTROL BLOOD PRESSURE (Patient taking differently: Take 4 mg by mouth daily.) 90 tablet 1 01/11/2021 at Unknown time  . ferrous sulfate 325 (65 FE) MG EC tablet Take 325 mg by mouth 2 (two) times daily.   12/28/2020 at Unknown time  . furosemide (LASIX) 40 MG tablet TAKE 2 TABLETS BY MOUTH DAILY AND 1 TABLET BY MOUTH AT LUNCH on  Mondays, Wednesdays and Fridays (Patient taking differently: Take 40-80 mg by mouth See admin instructions. 2 tablets in the morning  daily. Take 1 tablet at lunch on m,w,f in addition to morning dose) 270 tablet 1 12/28/2020 at Unknown time  . glimepiride (AMARYL) 1 MG tablet TAKE 1 TABLET(1 MG) BY MOUTH DAILY WITH BREAKFAST (Patient taking differently: Take 1 mg by mouth daily with breakfast.) 90 tablet 1 01/17/2021 at Unknown time  . glucose blood test strip 1 each by Other route 2 (two) times daily. OneTouch Verio DX E11.8 200 each 11   . JANUVIA 50 MG tablet TAKE 1 TABLET(50 MG) BY MOUTH DAILY (Patient taking differently: Take 50 mg by mouth daily.) 30 tablet 6 12/30/2020 at Unknown time  . Lancets (ONETOUCH ULTRASOFT) lancets 1 each by Other route 2 (two) times daily. OneTouch Verio DX E11.9 200 each 11   . levothyroxine (SYNTHROID) 88 MCG tablet TAKE 1 TABLET BY MOUTH DAILY ON AN EMPTY STOMACH (Patient taking differently: Take 88 mcg by mouth daily before breakfast.) 90 tablet 1 01/23/2021 at Unknown time  . OVER THE COUNTER MEDICATION Place 1 drop into both eyes daily. [facility] brand lubricating eye drop   01/05/2021 at Unknown time  . OXYGEN Inhale 2 L into the lungs continuous. Pt also uses 4L pulse via POC     . pantoprazole (PROTONIX) 40 MG tablet TAKE 1 TABLET(40 MG)  BY MOUTH TWICE DAILY (Patient taking differently: Take 40 mg by mouth 2 (two) times daily.) 180 tablet 1 01/02/2021 at Unknown time  . potassium gluconate 595 (99 K) MG TABS tablet Take 595 mg by mouth 2 (two) times daily.   12/27/2020 at Unknown time  . predniSONE (DELTASONE) 10 MG tablet Take 4 tabs po daily x 3 days; then 3 tabs daily x3 days; then 2 tabs daily x3 days; then 1 tab daily x 3 days; then stop 30 tablet 0 01/04/2021 at Unknown time  .  rosuvastatin (CRESTOR) 10 MG tablet TAKE 1 TABLET(10 MG) BY MOUTH DAILY (Patient taking differently: Take 10 mg by mouth daily.) 90 tablet 0 12/26/2020 at Unknown time  . TRELEGY ELLIPTA 200-62.5-25 MCG/INH AEPB INHALE 1 PUFF INTO THE LUNGS DAILY 60 each 6 01/03/2021 at Unknown time  . cholecalciferol (VITAMIN D) 1000 units tablet Take 1,000 Units by mouth at bedtime.    01/14/2021   Scheduled:  . Chlorhexidine Gluconate Cloth  6 each Topical Daily  . fluticasone furoate-vilanterol  1 puff Inhalation Daily   And  . umeclidinium bromide  1 puff Inhalation Daily  . insulin aspart  0-5 Units Subcutaneous QHS  . insulin aspart  0-9 Units Subcutaneous TID WC  . levothyroxine  88 mcg Oral Q0600  . metolazone  5 mg Oral BID  . midodrine  10 mg Oral TID WC  . pantoprazole  40 mg Oral BID  . QUEtiapine  12.5 mg Oral QHS  . rosuvastatin  10 mg Oral Daily  . sodium chloride flush  10-40 mL Intracatheter Q12H  . sodium chloride flush  3 mL Intravenous Q12H  . sodium chloride flush  3 mL Intravenous Q12H    Assessment: 85 yo female with HF and AKI noted on CRRT. She was on apixaban (last dose 3/26 at ~ 6pm) and pharmacy to start heparin  -hg= 8, plt= 126 -SCr= 2.74  3/28 AM update:  APTT low No issues per RN  Goal of Therapy:  Heparin level 0.3-0.7 units/ml aPTT 66-102 seconds Monitor platelets by anticoagulation protocol: Yes   Plan:  Inc heparin to 1150 units/hr 1300 aPTT   Narda Bonds, PharmD, BCPS Clinical Pharmacist Phone:  [phone]

## 2021-01-21 NOTE — Progress Notes (Signed)
[place] for heparin  Indication: atrial fibrillation  Allergies  Allergen Reactions  . Codeine Other (See Comments)    "Spaces out" the patient and she cannot walk well  . Vesicare [Solifenacin] Itching    Patient Measurements: Height: _0  (157.5 cm) Weight: 97 kg (213 lb 13.5 oz) IBW/kg (Calculated) : 50.1 HEPARIN DW (KG): 74.7   Vital Signs: Temp: 97.3 F (36.3 C) (03/28 0827) Temp Source: Oral (03/28 0827) BP: 59/48 (03/28 1400) Pulse Rate: 111 (03/28 1400)  Labs: Recent Labs    01/19/21 0928 01/20/21 0414 01/20/21 1638 01/21/21 0357 01/21/21 1300  HGB 9.5* 9.0*  --  9.3*  --   HCT 31.8* 28.2*  --  30.0*  --   PLT 126* 126*  --  144*  --   APTT  --   --   --  46* 53*  HEPARINUNFRC  --   --   --  1.00*  --   CREATININE  --  3.54* 2.74* 2.03*  --     Estimated Creatinine Clearance: 23.2 mL/min (A) (by C-G formula based on SCr of 2.03 mg/dL (H)).   Medical History: Past Medical History:  Diagnosis Date  . Allergy   . Anemia, unspecified   . Arthritis   . Asthma   . Atrial fibrillation status post cardioversion [facility]) 09/2015   s/p TEE/DCCV>>SR on amio  . Benign neoplasm of colon   . Carpal tunnel syndrome   . Cellulitis and abscess of finger, unspecified   . Cerumen impaction   . Cervicalgia   . CHF (congestive heart failure) ([place])   . Chronic airway obstruction, not elsewhere classified   . Chronic anticoagulation 09/2015   Eliquis  . Diarrhea   . Diffuse cystic mastopathy   . Edema   . Encounter for long-term (current) use of other medications   . GERD (gastroesophageal reflux disease)   . Heart murmur   . Lumbago   . Neck pain 05/23/2015  . Obesity, unspecified   . Other and unspecified hyperlipidemia   . Other psoriasis   . Pain in joint, lower leg   . PONV (postoperative nausea and vomiting)   . Primary localized osteoarthrosis of right shoulder 12/18/2015  . S/P TAVR (transcatheter aortic valve  replacement) 10/14/2016   23 mm Edwards Sapien 3 transcatheter heart valve placed via percutaneous left transfemoral approach  . Scoliosis (and kyphoscoliosis), idiopathic   . Shortness of breath dyspnea    with exertion  . Type II or unspecified type diabetes mellitus without mention of complication, uncontrolled   . Unspecified essential hypertension   . Unspecified hemorrhoids without mention of complication   . Unspecified hypothyroidism   . Urge incontinence     Medications:  Medications Prior to Admission  Medication Sig Dispense Refill Last Dose  . acetaminophen (TYLENOL) 650 MG CR tablet Take 1,300 mg by mouth every 8 (eight) hours as needed for pain.   Past Month at Unknown time  . albuterol (PROVENTIL) (2.5 MG/3ML) 0.083% nebulizer solution USE 1 VIAL IN NEBULIZER EVERY 4 HOURS AND AS NEEDED. Generic: VENTOLIN (Patient taking differently: Take 2.5 mg by nebulization every 4 (four) hours as needed for shortness of breath or wheezing.) 150 mL 3 Past Month at Unknown time  . albuterol (VENTOLIN HFA) 108 (90 Base) MCG/ACT inhaler INHALE 2 PUFFS INTO THE LUNGS EVERY 6 HOURS AS NEEDED FOR WHEEZING OR SHORTNESS OF BREATH (Patient taking differently: Inhale 2 puffs into the lungs every  6 (six) hours as needed for shortness of breath or wheezing.) 6.7 g 3 Past Month at Unknown time  . amiodarone (PACERONE) 200 MG tablet Take 1 tablet (200 mg total) by mouth daily. 30 tablet 11 01/17/2021 at Unknown time  . azelastine (ASTELIN) 0.1 % nasal spray Place 1 spray into both nostrils 2 (two) times daily. Use in each nostril as directed (Patient taking differently: Place 1 spray into both nostrils 2 (two) times daily as needed. Use in each nostril as directed) 30 mL 1 Past Month at Unknown time  . Blood Glucose Monitoring Suppl (ONETOUCH VERIO) w/Device KIT Check blood sugar twice daily DX:E11.9 1 kit 0   . cetirizine (ZYRTEC) 10 MG tablet Take 10 mg by mouth daily as needed for allergies.    unk  .  colestipol (COLESTID) 1 g tablet Take 1 g by mouth 2 (two) times daily.   12/25/2020 at Unknown time  . Dextromethorphan-guaiFENesin (MUCINEX DM) 30-600 MG TB12 Take 2 tablets by mouth as needed (cough).   Past Month at Unknown time  . doxazosin (CARDURA) 4 MG tablet TAKE 1 TABLET BY MOUTH EVERY DAY TO HELP CONTROL BLOOD PRESSURE (Patient taking differently: Take 4 mg by mouth daily.) 90 tablet 1 01/11/2021 at Unknown time  . ferrous sulfate 325 (65 FE) MG EC tablet Take 325 mg by mouth 2 (two) times daily.   01/06/2021 at Unknown time  . furosemide (LASIX) 40 MG tablet TAKE 2 TABLETS BY MOUTH DAILY AND 1 TABLET BY MOUTH AT LUNCH on  Mondays, Wednesdays and Fridays (Patient taking differently: Take 40-80 mg by mouth See admin instructions. 2 tablets in the morning  daily. Take 1 tablet at lunch on m,w,f in addition to morning dose) 270 tablet 1 01/14/2021 at Unknown time  . glimepiride (AMARYL) 1 MG tablet TAKE 1 TABLET(1 MG) BY MOUTH DAILY WITH BREAKFAST (Patient taking differently: Take 1 mg by mouth daily with breakfast.) 90 tablet 1 01/13/2021 at Unknown time  . glucose blood test strip 1 each by Other route 2 (two) times daily. OneTouch Verio DX E11.8 200 each 11   . JANUVIA 50 MG tablet TAKE 1 TABLET(50 MG) BY MOUTH DAILY (Patient taking differently: Take 50 mg by mouth daily.) 30 tablet 6 12/28/2020 at Unknown time  . Lancets (ONETOUCH ULTRASOFT) lancets 1 each by Other route 2 (two) times daily. OneTouch Verio DX E11.9 200 each 11   . levothyroxine (SYNTHROID) 88 MCG tablet TAKE 1 TABLET BY MOUTH DAILY ON AN EMPTY STOMACH (Patient taking differently: Take 88 mcg by mouth daily before breakfast.) 90 tablet 1 12/28/2020 at Unknown time  . OVER THE COUNTER MEDICATION Place 1 drop into both eyes daily. [facility] brand lubricating eye drop   12/28/2020 at Unknown time  . OXYGEN Inhale 2 L into the lungs continuous. Pt also uses 4L pulse via POC     . pantoprazole (PROTONIX) 40 MG tablet TAKE 1 TABLET(40 MG)  BY MOUTH TWICE DAILY (Patient taking differently: Take 40 mg by mouth 2 (two) times daily.) 180 tablet 1 01/16/2021 at Unknown time  . potassium gluconate 595 (99 K) MG TABS tablet Take 595 mg by mouth 2 (two) times daily.   01/10/2021 at Unknown time  . predniSONE (DELTASONE) 10 MG tablet Take 4 tabs po daily x 3 days; then 3 tabs daily x3 days; then 2 tabs daily x3 days; then 1 tab daily x 3 days; then stop 30 tablet 0 01/21/2021 at Unknown time  . rosuvastatin (CRESTOR)  10 MG tablet TAKE 1 TABLET(10 MG) BY MOUTH DAILY (Patient taking differently: Take 10 mg by mouth daily.) 90 tablet 0 01/16/2021 at Unknown time  . TRELEGY ELLIPTA 200-62.5-25 MCG/INH AEPB INHALE 1 PUFF INTO THE LUNGS DAILY 60 each 6 01/09/2021 at Unknown time  . cholecalciferol (VITAMIN D) 1000 units tablet Take 1,000 Units by mouth at bedtime.    01/14/2021   Scheduled:  . Chlorhexidine Gluconate Cloth  6 each Topical Daily  . fluticasone furoate-vilanterol  1 puff Inhalation Daily   And  . umeclidinium bromide  1 puff Inhalation Daily  . insulin aspart  0-5 Units Subcutaneous QHS  . insulin aspart  0-9 Units Subcutaneous TID WC  . levothyroxine  88 mcg Oral Q0600  . midodrine  10 mg Oral TID WC  . pantoprazole  40 mg Oral BID  . QUEtiapine  12.5 mg Oral QHS  . rosuvastatin  10 mg Oral Daily  . sodium chloride flush  10-40 mL Intracatheter Q12H  . sodium chloride flush  3 mL Intravenous Q12H  . sodium chloride flush  3 mL Intravenous Q12H    Assessment: 85 yo female with HF and AKI noted on CRRT. She was on apixaban (last dose 3/26 at ~ 6pm) and pharmacy to start heparin   8-hr follow up aPTT low at 53. No issues with heparin gtt noted. CBC low, stable.   Goal of Therapy:  Heparin level 0.3-0.7 units/ml aPTT 66-102 seconds Monitor platelets by anticoagulation protocol: Yes   Plan:  Increase heparin to 1,250 units/hr Follow up with ~8hr aPTT  Monitor daily CBC, s/sx bleeding   Mercy Riding, PharmD PGY1 Acute  Care Pharmacy Resident Please refer to [facility] for unit-specific pharmacist

## 2021-01-21 NOTE — Progress Notes (Signed)
[facility] Progress Note    Assessment/ Plan:   AKI on CKD 4 -Baseline creatinine seems to range around 1.6-2 with underlying proteinuria.  Suspect is a component of diabetic kidney disease and hypertensive arterionephrosclerosis -AKI likely related to cardiorenal syndrome -Initiated CRRT 3/27 via temp cath in setting of worsening renal function and volume overload refractory to max medical therapies; running ok after addition of heparin -Off lasix gtt given lack of response, UFR net neg 165m/hr which I think is appropriate at this time -Ongoing disucssion re: feasibility and utility of long term RRT in settting of numerous comorbidities; palliative care is aiding in discussion, appreciated.  At baseline patient is fairly independent of IADLs - lives alone, can drive, orders own groceries; walker in home, cane out of home -Continue to avoid nephrotoxins and dose meds for CRRT  Acute decompensated biventricular failure -Cardio on board, status post [place]/26/2022.  On NE (started after CRRT) and milrinone drips. CVVHD today for UF -Palliative on board  Afib w/ RVR -on amio gtt -tee guided dccv once chf has improved  Acute on chronic hypoxic and hypercapnic respiratory failure, history of OHS, COPD -Also with acute pulmonary edema -Pulmonology on board  Hypotension, history of hypertension -Likely secondary to poor cardiac output - on milrinone and NE  DM2 with hyperglycemia -Management per primary service  Anemia of chronic disease and chronic IDA - transfuse for hemoglobin less than 7, per primary  Subjective:   Initiated CRRT yesterday - I/Os 1.9 / 2.7 net neg 8524mHeparin added and now no filter clotting Pt states feels about same; c/o poor appetite but per RN did eat entire lunch yesterday and some breakfast this AM   Objective:   BP 120/73   Pulse (!) 114   Temp (!) 97.3 F (36.3 C) (Oral)   Resp (!) 27   Ht _0  (1.575 m)   Wt 97 kg   SpO2 98%    BMI 39.11 kg/m   Intake/Output Summary (Last 24 hours) at 01/21/2021 0834 Last data filed at 01/21/2021 0800 Gross per 24 hour  Intake 2199.49 ml  Output 2836 ml  Net -636.51 ml   Weight change: -1.5 kg  Physical Exam: Gen:nad, sitting in chair on CRRT  CVS:  irregularly irregular Resp: slightly diminished air entry bibasilar posteriorly Abd: Obese, soft, nontender Ext: 2+ pitting edema bilateral lower extremities Neuro: Speech and coherent, moves all extremities spontaneously  Imaging: DG CHEST PORT 1 VIEW  Result Date: 01/20/2021 CLINICAL DATA:  Central line placement EXAM: PORTABLE CHEST 1 VIEW COMPARISON:  01/17/2021 FINDINGS: A LEFT IJ central venous catheter is noted with tips overlying the mid-LOWER SVC. Cardiomegaly and aortic valve replacement noted. Pulmonary vascular congestion with interstitial edema and RIGHT pleural effusion again identified. There may be a trace LEFT pleural effusion present. There is no evidence of pneumothorax. IMPRESSION: LEFT IJ central venous catheter with tips overlying the mid-LOWER SVC. No other significant change. No pneumothorax. Electronically Signed   By: JeMargarette Canada.D.   On: 01/20/2021 14:13    Labs: BMET Recent Labs  Lab 01/16/21 0437 01/17/21 0318 12/31/2020 0403 12/25/2020 1543 01/19/21 0223 01/20/21 0414 01/20/21 1638 01/21/21 0357  NA 137 137 136 139  139 136 133* 132* 134*  K 4.6 4.4 4.0 3.8  3.9 3.8 3.5 3.2* 3.6  CL 97* 96* 93*  --  94* 92* 92* 96*  CO2 31 33* 32  --  33* 27 32 29  GLUCOSE 190* 143* 157*  --  183* 203* 176* 172*  BUN 63* 73* 81*  --  88* 98* 77* 45*  CREATININE 2.16* 2.81* 3.00*  --  3.14* 3.54* 2.74* 2.03*  CALCIUM 9.2 8.9 8.5*  --  8.5* 8.2* 7.9* 8.1*  PHOS  --   --   --   --  6.6*  --  4.2 2.9   CBC Recent Labs  Lab 01/13/2021 1904 01/16/21 0437 01/17/21 0318 01/16/2021 1543 01/19/21 0928 01/20/21 0414 01/21/21 0357  WBC 5.8   < > 5.6  --  5.5 6.5 6.4  NEUTROABS 5.3  --  4.2  --   --   --    --   HGB 10.2*   < > 9.4* 9.9*  10.2* 9.5* 9.0* 9.3*  HCT 34.2*   < > 29.9* 29.0*  30.0* 31.8* 28.2* 30.0*  MCV 95.3   < > 94.0  --  94.9 91.9 93.8  PLT 150   < > 133*  --  126* 126* 144*   < > = values in this interval not displayed.    Medications:    . Chlorhexidine Gluconate Cloth  6 each Topical Daily  . fluticasone furoate-vilanterol  1 puff Inhalation Daily   And  . umeclidinium bromide  1 puff Inhalation Daily  . insulin aspart  0-5 Units Subcutaneous QHS  . insulin aspart  0-9 Units Subcutaneous TID WC  . levothyroxine  88 mcg Oral Q0600  . metolazone  5 mg Oral BID  . midodrine  10 mg Oral TID WC  . pantoprazole  40 mg Oral BID  . QUEtiapine  12.5 mg Oral QHS  . rosuvastatin  10 mg Oral Daily  . sodium chloride flush  10-40 mL Intracatheter Q12H  . sodium chloride flush  3 mL Intravenous Q12H  . sodium chloride flush  3 mL Intravenous Q12H      Jannifer Hick MD [facility] Pager [phone]

## 2021-01-21 NOTE — Plan of Care (Signed)

## 2021-01-21 NOTE — Evaluation (Signed)
OT Cancellation Note  Patient Details Name: Patricia [name] MRN: [id number] DOB: 1936-05-02   Cancelled Treatment:    Reason Eval/Treat Not Completed: Other (comment) (Pt declined stating she was too tired from sitting in recliner ealier today.)  [place], OTR/L Acute Rehab Pager: [phone] Office: [phone] 01/21/2021, 2:39 PM

## 2021-01-22 ENCOUNTER — Inpatient Hospital Stay (HOSPITAL_COMMUNITY): Payer: Medicare Other

## 2021-01-22 DIAGNOSIS — I480 Paroxysmal atrial fibrillation: Secondary | ICD-10-CM | POA: Diagnosis not present

## 2021-01-22 DIAGNOSIS — I2510 Atherosclerotic heart disease of native coronary artery without angina pectoris: Secondary | ICD-10-CM | POA: Diagnosis not present

## 2021-01-22 DIAGNOSIS — I5023 Acute on chronic systolic (congestive) heart failure: Secondary | ICD-10-CM | POA: Diagnosis not present

## 2021-01-22 DIAGNOSIS — J9621 Acute and chronic respiratory failure with hypoxia: Secondary | ICD-10-CM | POA: Diagnosis not present

## 2021-01-22 DIAGNOSIS — N1832 Chronic kidney disease, stage 3b: Secondary | ICD-10-CM | POA: Diagnosis not present

## 2021-01-22 DIAGNOSIS — I509 Heart failure, unspecified: Secondary | ICD-10-CM | POA: Diagnosis not present

## 2021-01-22 DIAGNOSIS — N179 Acute kidney failure, unspecified: Secondary | ICD-10-CM | POA: Diagnosis not present

## 2021-01-22 LAB — CBC
HCT: 31.9 % — ABNORMAL LOW (ref 36.0–46.0)
Hemoglobin: 9.8 g/dL — ABNORMAL LOW (ref 12.0–15.0)
MCH: 29.3 pg (ref 26.0–34.0)
MCHC: 30.7 g/dL (ref 30.0–36.0)
MCV: 95.2 fL (ref 80.0–100.0)
Platelets: 162 10*3/uL (ref 150–400)
RBC: 3.35 MIL/uL — ABNORMAL LOW (ref 3.87–5.11)
RDW: 16.8 % — ABNORMAL HIGH (ref 11.5–15.5)
WBC: 8.1 10*3/uL (ref 4.0–10.5)
nRBC: 0.5 % — ABNORMAL HIGH (ref 0.0–0.2)

## 2021-01-22 LAB — RENAL FUNCTION PANEL
Albumin: 3.4 g/dL — ABNORMAL LOW (ref 3.5–5.0)
Albumin: 3.4 g/dL — ABNORMAL LOW (ref 3.5–5.0)
Anion gap: 5 (ref 5–15)
Anion gap: 7 (ref 5–15)
BUN: 19 mg/dL (ref 8–23)
BUN: 15 mg/dL (ref 8–23)
CO2: 29 mmol/L (ref 22–32)
CO2: 28 mmol/L (ref 22–32)
Calcium: 8.3 mg/dL — ABNORMAL LOW (ref 8.9–10.3)
Calcium: 8.3 mg/dL — ABNORMAL LOW (ref 8.9–10.3)
Chloride: 100 mmol/L (ref 98–111)
Chloride: 97 mmol/L — ABNORMAL LOW (ref 98–111)
Creatinine, Ser: 1.61 mg/dL — ABNORMAL HIGH (ref 0.44–1.00)
Creatinine, Ser: 1.54 mg/dL — ABNORMAL HIGH (ref 0.44–1.00)
GFR, Estimated: 32 mL/min — ABNORMAL LOW (ref >60)
GFR, Estimated: 34 mL/min — ABNORMAL LOW (ref >60)
Glucose, Bld: 205 mg/dL — ABNORMAL HIGH (ref 70–99)
Glucose, Bld: 216 mg/dL — ABNORMAL HIGH (ref 70–99)
Phosphorus: 2.6 mg/dL (ref 2.5–4.6)
Phosphorus: 2.6 mg/dL (ref 2.5–4.6)
Potassium: 4.4 mmol/L (ref 3.5–5.1)
Potassium: 4.6 mmol/L (ref 3.5–5.1)
Sodium: 134 mmol/L — ABNORMAL LOW (ref 135–145)
Sodium: 132 mmol/L — ABNORMAL LOW (ref 135–145)

## 2021-01-22 LAB — HEPARIN LEVEL (UNFRACTIONATED)
Heparin Unfractionated: 0.71 IU/mL — ABNORMAL HIGH (ref 0.30–0.70)
Heparin Unfractionated: 0.73 IU/mL — ABNORMAL HIGH (ref 0.30–0.70)

## 2021-01-22 LAB — GLUCOSE, CAPILLARY
Glucose-Capillary: 183 mg/dL — ABNORMAL HIGH (ref 70–99)
Glucose-Capillary: 199 mg/dL — ABNORMAL HIGH (ref 70–99)
Glucose-Capillary: 210 mg/dL — ABNORMAL HIGH (ref 70–99)

## 2021-01-22 LAB — COOXEMETRY PANEL
Carboxyhemoglobin: 1.2 % (ref 0.5–1.5)
Methemoglobin: 1 % (ref 0.0–1.5)
O2 Saturation: 71 %
Total hemoglobin: 10.6 g/dL — ABNORMAL LOW (ref 12.0–16.0)

## 2021-01-22 LAB — BRAIN NATRIURETIC PEPTIDE: B Natriuretic Peptide: 1143.9 pg/mL — ABNORMAL HIGH (ref 0.0–100.0)

## 2021-01-22 LAB — MAGNESIUM: Magnesium: 2.4 mg/dL (ref 1.7–2.4)

## 2021-01-22 LAB — APTT
aPTT: 61 seconds — ABNORMAL HIGH (ref 24–36)
aPTT: 67 seconds — ABNORMAL HIGH (ref 24–36)

## 2021-01-22 MED ORDER — MIDODRINE HCL 5 MG PO TABS
15.0000 mg | ORAL_TABLET | Freq: Three times a day (TID) | ORAL | Status: DC
  Administered 2021-01-22 – 2021-01-25 (×9): 15 mg via ORAL
  Filled 2021-01-22 (×8): qty 3

## 2021-01-22 MED ORDER — POLYETHYLENE GLYCOL 3350 17 G PO PACK
17.0000 g | PACK | Freq: Every day | ORAL | Status: DC | PRN
  Administered 2021-01-22: 17 g via ORAL
  Filled 2021-01-22: qty 1

## 2021-01-22 MED ORDER — DOCUSATE SODIUM 50 MG PO CAPS
50.0000 mg | ORAL_CAPSULE | Freq: Every day | ORAL | Status: DC | PRN
  Administered 2021-01-22 – 2021-01-25 (×3): 50 mg via ORAL
  Filled 2021-01-22 (×5): qty 1

## 2021-01-22 NOTE — TOC Initial Note (Signed)
Transition of Care (TOC) - Initial/Assessment Note  Heart Failure   Patient Details  Name: Patricia [name] MRN: [id number] Date of Birth: 11/04/1935  Transition of [facility]) CM/SW Contact:    [place], [place] Phone Number: 01/22/2021, 2:08 PM  Clinical Narrative:     CSW made several attempts today to talk with Ms. Kienitz about PT's home health recommendation however patient refused to speak with CSW. CSW attempted to meet patient at bedside twice this morning with no luck. CSW attempted to call patient over the phone and was unable to get through or leave a message. Ms. Calma had visitors the 3rd time CSW came by the room and patient refused to speak with social worker and CSW will try again once patients visitors leave.   TOC will continue to follow for d/c needs.                    Patient Goals and CMS Choice        Expected Discharge Plan and Services                                                Prior Living Arrangements/Services                       Activities of Daily Living      Permission Sought/Granted                  Emotional Assessment              Admission diagnosis:  CHF exacerbation ([place]) [I50.9] Acute on chronic respiratory failure with hypoxia (HCC) [J96.21] Acute on chronic systolic (congestive) heart failure (HCC) [I50.23] Acute on chronic congestive heart failure, unspecified heart failure type [facility]) [I50.9] Patient Active Problem List   Diagnosis Date Noted  . Pressure ulcer of coccygeal region 01/21/2021  . Heart failure with preserved ejection fraction ([place])   . CHF exacerbation ([place]) 01/16/2021  . Acute on chronic systolic (congestive) heart failure ([place]) 01/02/2021  . Hyperlipidemia associated with type 2 diabetes mellitus ([place]) 12/29/2020  . Abnormal CT of the chest 06/17/2020  . Chronic respiratory failure with hypoxia ([place]) 03/02/2020  . Rhinitis 03/02/2020  . Pressure injury of skin  07/26/2019  . Dyspnea 07/26/2019  . Acute on chronic diastolic (congestive) heart failure ([place]) 07/25/2019  . Acute diastolic CHF (congestive heart failure) ([place]) 07/25/2019  . COPD with acute exacerbation ([place]) 03/17/2019  . GI bleed 01/20/2018  . UGIB (upper gastrointestinal bleed) 03/11/2017  . AKI (acute kidney injury) ([place]) 03/11/2017  . Lung nodule 01/26/2017  . Macrocytosis 01/20/2017  . Hypoxemia 10/24/2016  . Asthma 10/24/2016  . Hypersomnia 10/24/2016  . Obesity hypoventilation syndrome ([place]) 10/24/2016  . 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 Acute on chronic respiratory failure with hypoxia ([place])   . Aortic valve stenosis 09/11/2016  . Acute blood loss anemia 02/06/2016  . Acute on chronic diastolic heart failure ([place]) 02/06/2016  . Paroxysmal atrial fibrillation ([place]) 02/05/2016  . Primary localized osteoarthrosis of right shoulder 12/18/2015  . Osteoarthritis of right shoulder 12/18/2015  . Post-nasal drainage 11/27/2015  . Anemia 09/26/2015  . Neck pain 05/23/2015  . Knee pain, bilateral 07/20/2014  . Lumbago 11/02/2013  . Trigger finger, acquired 11/02/2013  . Hyperlipidemia 11/02/2013  . Urinary incontinence 11/02/2013  .  Rash and nonspecific skin eruption 11/02/2013  . COPD (chronic obstructive pulmonary disease) ([place])   . Hypothyroidism   . Hypertension associated with diabetes ([place]) 03/02/2013  . DM type 2 (diabetes mellitus, type 2) ([place]) 03/02/2013   PCP:  [name], NP Pharmacy:   [facility] [place], [place] [address] [place] [address] Phone: [phone] Fax: [phone]  [facility] [place], [place] [address] [place] [address] Phone: [phone] Fax: [phone]     Social Determinants of Health (SDOH) Interventions    Readmission Risk  Interventions No flowsheet data found.  Kayo Zion, MSW, [place] Heart Failure Social Worker

## 2021-01-22 NOTE — Progress Notes (Signed)
Inpatient Diabetes Program Recommendations  AACE/ADA: New Consensus Statement on Inpatient Glycemic Control   Target Ranges:  Prepandial:   less than 140 mg/dL      Peak postprandial:   less than 180 mg/dL (1-2 hours)      Critically ill patients:  140 - 180 mg/dL  Results for HEDI, BARKAN (MRN [id number]) as of 01/22/2021 10:03  Ref. Range 01/21/2021 06:51 01/21/2021 11:47 01/21/2021 16:44 01/21/2021 21:05 01/22/2021 06:54  Glucose-Capillary Latest Ref Range: 70 - 99 mg/dL 173 (H) 209 (H) 219 (H) 219 (H) 183 (H)    Review of Glycemic Control  Diabetes history: DM2 Outpatient Diabetes medications: Amaryl 1 mg QAM, Januvia 50 mg daily Current orders for Inpatient glycemic control: Novolog 0-9 units TID with meals, Novolog 0-5 units QHS  Inpatient Diabetes Program Recommendations:    Insulin: Please consider ordering Novolog 4 units TID with meals for meal coverage if patient eats at least 50% of meals.  Thanks, Barnie Alderman, RN, MSN, CDE Diabetes Coordinator Inpatient Diabetes Program [phone] (Team Pager from 8am to 5pm)

## 2021-01-22 NOTE — Progress Notes (Signed)
[place] for heparin  Indication: atrial fibrillation  Allergies  Allergen Reactions  . Codeine Other (See Comments)    "Spaces out" the patient and she cannot walk well  . Vesicare [Solifenacin] Itching    Patient Measurements: Height: _0  (157.5 cm) Weight: 97 kg (213 lb 13.5 oz) IBW/kg (Calculated) : 50.1 HEPARIN DW (KG): 74.7   Vital Signs: Temp: 97.6 F (36.4 C) (03/29 0030) Temp Source: Oral (03/29 0030) BP: 113/47 (03/29 0030) Pulse Rate: 125 (03/29 0030)  Labs: Recent Labs    01/19/21 0928 01/20/21 0414 01/20/21 1638 01/21/21 0357 01/21/21 1300 01/21/21 1644 01/21/21 2343  HGB 9.5* 9.0*  --  9.3*  --   --   --   HCT 31.8* 28.2*  --  30.0*  --   --   --   PLT 126* 126*  --  144*  --   --   --   APTT  --   --   --  46* 53*  --  67*  HEPARINUNFRC  --   --   --  1.00*  --   --  0.73*  CREATININE  --  3.54* 2.74* 2.03*  --  1.63*  --     Estimated Creatinine Clearance: 28.9 mL/min (A) (by C-G formula based on SCr of 1.63 mg/dL (H)).   Medical History: Past Medical History:  Diagnosis Date  . Allergy   . Anemia, unspecified   . Arthritis   . Asthma   . Atrial fibrillation status post cardioversion [facility]) 09/2015   s/p TEE/DCCV>>SR on amio  . Benign neoplasm of colon   . Carpal tunnel syndrome   . Cellulitis and abscess of finger, unspecified   . Cerumen impaction   . Cervicalgia   . CHF (congestive heart failure) ([place])   . Chronic airway obstruction, not elsewhere classified   . Chronic anticoagulation 09/2015   Eliquis  . Diarrhea   . Diffuse cystic mastopathy   . Edema   . Encounter for long-term (current) use of other medications   . GERD (gastroesophageal reflux disease)   . Heart murmur   . Lumbago   . Neck pain 05/23/2015  . Obesity, unspecified   . Other and unspecified hyperlipidemia   . Other psoriasis   . Pain in joint, lower leg   . PONV (postoperative nausea and vomiting)   . Primary  localized osteoarthrosis of right shoulder 12/18/2015  . 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 Scoliosis (and kyphoscoliosis), idiopathic   . Shortness of breath dyspnea    with exertion  . Type II or unspecified type diabetes mellitus without mention of complication, uncontrolled   . Unspecified essential hypertension   . Unspecified hemorrhoids without mention of complication   . Unspecified hypothyroidism   . Urge incontinence    Scheduled:  . Chlorhexidine Gluconate Cloth  6 each Topical Daily  . fluticasone furoate-vilanterol  1 puff Inhalation Daily   And  . umeclidinium bromide  1 puff Inhalation Daily  . insulin aspart  0-5 Units Subcutaneous QHS  . insulin aspart  0-9 Units Subcutaneous TID WC  . levothyroxine  88 mcg Oral Q0600  . midodrine  10 mg Oral TID WC  . pantoprazole  40 mg Oral BID  . QUEtiapine  12.5 mg Oral QHS  . rosuvastatin  10 mg Oral Daily  . sodium  chloride flush  10-40 mL Intracatheter Q12H  . sodium chloride flush  3 mL Intravenous Q12H  . sodium chloride flush  3 mL Intravenous Q12H    Assessment: 85 yo female with HF and AKI noted on CRRT. She was on apixaban (last dose 3/26 at ~ 6pm) and pharmacy to start heparin   8-hr follow up aPTT low at 53. No issues with heparin gtt noted. CBC low, stable.   3/29 AM update:  APTT therapeutic after rate increase  Goal of Therapy:  Heparin level 0.3-0.7 units/ml aPTT 66-102 seconds Monitor platelets by anticoagulation protocol: Yes   Plan:  Cont heparin at 1250 units/hr Confirmatory aPTT with AM labs   Narda Bonds, PharmD, [place] Pharmacist Phone: [phone]

## 2021-01-22 NOTE — Progress Notes (Signed)
[place] for heparin  Indication: atrial fibrillation  Allergies  Allergen Reactions  . Codeine Other (See Comments)    "Spaces out" the patient and she cannot walk well  . Vesicare [Solifenacin] Itching    Patient Measurements: Height: _0  (157.5 cm) Weight: 95.7 kg (210 lb 15.7 oz) IBW/kg (Calculated) : 50.1 HEPARIN DW (KG): 74.7   Vital Signs: Temp: 97.8 F (36.6 C) (03/29 0430) Temp Source: Oral (03/29 0430) BP: 102/52 (03/29 0530) Pulse Rate: 94 (03/29 0530)  Labs: Recent Labs    01/20/21 0414 01/20/21 1638 01/21/21 0357 01/21/21 1300 01/21/21 1644 01/21/21 2343 01/22/21 0448 01/22/21 0449  HGB 9.0*  --  9.3*  --   --   --  9.8*  --   HCT 28.2*  --  30.0*  --   --   --  31.9*  --   PLT 126*  --  144*  --   --   --  162  --   APTT  --    < > 46* 53*  --  67* 61*  --   HEPARINUNFRC  --   --  1.00*  --   --  0.73*  --  0.71*  CREATININE 3.54*   < > 2.03*  --  1.63*  --  1.61*  --    < > = values in this interval not displayed.    Estimated Creatinine Clearance: 29 mL/min (A) (by C-G formula based on SCr of 1.61 mg/dL (H)).   Medical History: Past Medical History:  Diagnosis Date  . Allergy   . Anemia, unspecified   . Arthritis   . Asthma   . Atrial fibrillation status post cardioversion [facility]) 09/2015   s/p TEE/DCCV>>SR on amio  . Benign neoplasm of colon   . Carpal tunnel syndrome   . Cellulitis and abscess of finger, unspecified   . Cerumen impaction   . Cervicalgia   . CHF (congestive heart failure) ([place])   . Chronic airway obstruction, not elsewhere classified   . Chronic anticoagulation 09/2015   Eliquis  . Diarrhea   . Diffuse cystic mastopathy   . Edema   . Encounter for long-term (current) use of other medications   . GERD (gastroesophageal reflux disease)   . Heart murmur   . Lumbago   . Neck pain 05/23/2015  . Obesity, unspecified   . Other and unspecified hyperlipidemia   . Other psoriasis    . Pain in joint, lower leg   . PONV (postoperative nausea and vomiting)   . Primary localized osteoarthrosis of right shoulder 12/18/2015  . 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 Scoliosis (and kyphoscoliosis), idiopathic   . Shortness of breath dyspnea    with exertion  . Type II or unspecified type diabetes mellitus without mention of complication, uncontrolled   . Unspecified essential hypertension   . Unspecified hemorrhoids without mention of complication   . Unspecified hypothyroidism   . Urge incontinence     Medications:  Medications Prior to Admission  Medication Sig Dispense Refill Last Dose  . acetaminophen (TYLENOL) 650 MG CR tablet Take 1,300 mg by mouth every 8 (eight) hours as needed for pain.   Past Month at Unknown time  . albuterol (PROVENTIL) (2.5 MG/3ML) 0.083% nebulizer solution USE 1 VIAL IN NEBULIZER EVERY 4 HOURS AND AS NEEDED. Generic: VENTOLIN (Patient taking  differently: Take 2.5 mg by nebulization every 4 (four) hours as needed for shortness of breath or wheezing.) 150 mL 3 Past Month at Unknown time  . albuterol (VENTOLIN HFA) 108 (90 Base) MCG/ACT inhaler INHALE 2 PUFFS INTO THE LUNGS EVERY 6 HOURS AS NEEDED FOR WHEEZING OR SHORTNESS OF BREATH (Patient taking differently: Inhale 2 puffs into the lungs every 6 (six) hours as needed for shortness of breath or wheezing.) 6.7 g 3 Past Month at Unknown time  . amiodarone (PACERONE) 200 MG tablet Take 1 tablet (200 mg total) by mouth daily. 30 tablet 11 01/17/2021 at Unknown time  . azelastine (ASTELIN) 0.1 % nasal spray Place 1 spray into both nostrils 2 (two) times daily. Use in each nostril as directed (Patient taking differently: Place 1 spray into both nostrils 2 (two) times daily as needed. Use in each nostril as directed) 30 mL 1 Past Month at Unknown time  . Blood Glucose Monitoring  Suppl (ONETOUCH VERIO) w/Device KIT Check blood sugar twice daily DX:E11.9 1 kit 0   . cetirizine (ZYRTEC) 10 MG tablet Take 10 mg by mouth daily as needed for allergies.    unk  . colestipol (COLESTID) 1 g tablet Take 1 g by mouth 2 (two) times daily.   12/31/2020 at Unknown time  . Dextromethorphan-guaiFENesin (MUCINEX DM) 30-600 MG TB12 Take 2 tablets by mouth as needed (cough).   Past Month at Unknown time  . doxazosin (CARDURA) 4 MG tablet TAKE 1 TABLET BY MOUTH EVERY DAY TO HELP CONTROL BLOOD PRESSURE (Patient taking differently: Take 4 mg by mouth daily.) 90 tablet 1 01/03/2021 at Unknown time  . ferrous sulfate 325 (65 FE) MG EC tablet Take 325 mg by mouth 2 (two) times daily.   01/17/2021 at Unknown time  . furosemide (LASIX) 40 MG tablet TAKE 2 TABLETS BY MOUTH DAILY AND 1 TABLET BY MOUTH AT LUNCH on  Mondays, Wednesdays and Fridays (Patient taking differently: Take 40-80 mg by mouth See admin instructions. 2 tablets in the morning  daily. Take 1 tablet at lunch on m,w,f in addition to morning dose) 270 tablet 1 01/13/2021 at Unknown time  . glimepiride (AMARYL) 1 MG tablet TAKE 1 TABLET(1 MG) BY MOUTH DAILY WITH BREAKFAST (Patient taking differently: Take 1 mg by mouth daily with breakfast.) 90 tablet 1 01/23/2021 at Unknown time  . glucose blood test strip 1 each by Other route 2 (two) times daily. OneTouch Verio DX E11.8 200 each 11   . JANUVIA 50 MG tablet TAKE 1 TABLET(50 MG) BY MOUTH DAILY (Patient taking differently: Take 50 mg by mouth daily.) 30 tablet 6 01/07/2021 at Unknown time  . Lancets (ONETOUCH ULTRASOFT) lancets 1 each by Other route 2 (two) times daily. OneTouch Verio DX E11.9 200 each 11   . levothyroxine (SYNTHROID) 88 MCG tablet TAKE 1 TABLET BY MOUTH DAILY ON AN EMPTY STOMACH (Patient taking differently: Take 88 mcg by mouth daily before breakfast.) 90 tablet 1 12/31/2020 at Unknown time  . OVER THE COUNTER MEDICATION Place 1 drop into both eyes daily. [facility] brand lubricating  eye drop   01/10/2021 at Unknown time  . OXYGEN Inhale 2 L into the lungs continuous. Pt also uses 4L pulse via POC     . pantoprazole (PROTONIX) 40 MG tablet TAKE 1 TABLET(40 MG) BY MOUTH TWICE DAILY (Patient taking differently: Take 40 mg by mouth 2 (two) times daily.) 180 tablet 1 12/25/2020 at Unknown time  . potassium gluconate 595 (99 K) MG TABS  tablet Take 595 mg by mouth 2 (two) times daily.   01/06/2021 at Unknown time  . predniSONE (DELTASONE) 10 MG tablet Take 4 tabs po daily x 3 days; then 3 tabs daily x3 days; then 2 tabs daily x3 days; then 1 tab daily x 3 days; then stop 30 tablet 0 01/23/2021 at Unknown time  . rosuvastatin (CRESTOR) 10 MG tablet TAKE 1 TABLET(10 MG) BY MOUTH DAILY (Patient taking differently: Take 10 mg by mouth daily.) 90 tablet 0 12/25/2020 at Unknown time  . TRELEGY ELLIPTA 200-62.5-25 MCG/INH AEPB INHALE 1 PUFF INTO THE LUNGS DAILY 60 each 6 01/14/2021 at Unknown time  . cholecalciferol (VITAMIN D) 1000 units tablet Take 1,000 Units by mouth at bedtime.    01/14/2021   Scheduled:  . Chlorhexidine Gluconate Cloth  6 each Topical Daily  . fluticasone furoate-vilanterol  1 puff Inhalation Daily   And  . umeclidinium bromide  1 puff Inhalation Daily  . insulin aspart  0-5 Units Subcutaneous QHS  . insulin aspart  0-9 Units Subcutaneous TID WC  . levothyroxine  88 mcg Oral Q0600  . midodrine  10 mg Oral TID WC  . pantoprazole  40 mg Oral BID  . QUEtiapine  12.5 mg Oral QHS  . rosuvastatin  10 mg Oral Daily  . sodium chloride flush  10-40 mL Intracatheter Q12H  . sodium chloride flush  3 mL Intravenous Q12H  . sodium chloride flush  3 mL Intravenous Q12H    Assessment: 85 yo female with HF and AKI noted on CRRT. She was on apixaban (last dose 3/26 at ~ 6pm) and pharmacy to start heparin   Follow up aPTT slightly low at 61. No issues with heparin gtt noted. CBC low, stable. Will increase rate slightly given history of GIB.  Goal of Therapy:  Heparin level  0.3-0.7 units/ml aPTT 66-102 seconds Monitor platelets by anticoagulation protocol: Yes   Plan:  Increase heparin to 1,300 units/hr Follow daily aPTT/HL, CBC, s/sx bleeding  [place], PharmD PGY1 Acute Care Pharmacy Resident Please refer to [facility] for unit-specific pharmacist

## 2021-01-22 NOTE — Progress Notes (Addendum)
Patient ID: Patricia [name], female   DOB: [birth year], 85 y.o.   MRN: [id number] P    Advanced Heart Failure Rounding Note  PCP-Cardiologist: [name] Martinique, MD   Subjective:    CVVH 3/27, weight down another 3lbs.  Pulling UF 75cc/hr.  Co-ox 71%    Remains on amio 30 mg, milrinone 0. 125, norepi 10 mcg.   Denies SOB.    Objective:   Weight Range: 95.7 kg Body mass index is 38.59 kg/m.   Vital Signs:   Temp:  [97.3 F (36.3 C)-98 F (36.7 C)] 98 F (36.7 C) (03/29 0655) Pulse Rate:  [80-131] 117 (03/29 0700) Resp:  [16-31] 19 (03/29 0700) BP: (42-168)/(28-125) 96/74 (03/29 0700) SpO2:  [84 %-100 %] 97 % (03/29 0700) Weight:  [95.7 kg] 95.7 kg (03/29 0500) Last BM Date: 12/30/2020  Weight change: Filed Weights   01/20/21 0000 01/21/21 0530 01/22/21 0500  Weight: 98.5 kg 97 kg 95.7 kg    Intake/Output:   Intake/Output Summary (Last 24 hours) at 01/22/2021 0731 Last data filed at 01/22/2021 0700 Gross per 24 hour  Intake 2392.42 ml  Output 3878 ml  Net -1485.58 ml      Physical Exam   CVP 14 General:  Elderly  No resp difficulty HEENT: normal Neck: supple. JVP 11-12 . Carotids 2+ bilat; no bruits. No lymphadenopathy or thryomegaly appreciated. Cor: PMI nondisplaced. Irregular rate & rhythm. No rubs, gallops or murmurs. Lungs: clear Abdomen: soft, nontender, nondistended. No hepatosplenomegaly. No bruits or masses. Good bowel sounds. Extremities: no cyanosis, clubbing, rash, edema Neuro: alert & orientedx3, cranial nerves grossly intact. moves all 4 extremities w/o difficulty. Affect flat    Telemetry    A fib 100-120s   Labs    CBC Recent Labs    01/21/21 0357 01/22/21 0448  WBC 6.4 8.1  HGB 9.3* 9.8*  HCT 30.0* 31.9*  MCV 93.8 95.2  PLT 144* 704   Basic Metabolic Panel Recent Labs    01/21/21 0357 01/21/21 1644 01/22/21 0448  NA 134* 130* 134*  K 3.6 4.0 4.4  CL 96* 95* 100  CO2 _0 GLUCOSE 172* 223* 205*  BUN 45* 29* 19   CREATININE 2.03* 1.63* 1.61*  CALCIUM 8.1* 8.1* 8.3*  MG 2.1  --  2.4  PHOS 2.9 2.3* 2.6   Liver Function Tests Recent Labs    01/21/21 1644 01/22/21 0448  ALBUMIN 3.3* 3.4*   No results for input(s): LIPASE, AMYLASE in the last 72 hours. Cardiac Enzymes No results for input(s): CKTOTAL, CKMB, CKMBINDEX, TROPONINI in the last 72 hours.  BNP: BNP (last 3 results) Recent Labs    09/17/20 1455 12/17/20 0022 12/26/2020 1904  BNP 572* 574.3* 1,681.6*    ProBNP (last 3 results) No results for input(s): PROBNP in the last 8760 hours.   D-Dimer No results for input(s): DDIMER in the last 72 hours. Hemoglobin A1C No results for input(s): HGBA1C in the last 72 hours. Fasting Lipid Panel No results for input(s): CHOL, HDL, LDLCALC, TRIG, CHOLHDL, LDLDIRECT in the last 72 hours. Thyroid Function Tests No results for input(s): TSH, T4TOTAL, T3FREE, THYROIDAB in the last 72 hours.  Invalid input(s): FREET3  Other results:   Imaging    No results found.   Medications:     Scheduled Medications: . Chlorhexidine Gluconate Cloth  6 each Topical Daily  . fluticasone furoate-vilanterol  1 puff Inhalation Daily   And  . umeclidinium bromide  1 puff Inhalation Daily  .  insulin aspart  0-5 Units Subcutaneous QHS  . insulin aspart  0-9 Units Subcutaneous TID WC  . levothyroxine  88 mcg Oral Q0600  . midodrine  10 mg Oral TID WC  . pantoprazole  40 mg Oral BID  . QUEtiapine  12.5 mg Oral QHS  . rosuvastatin  10 mg Oral Daily  . sodium chloride flush  10-40 mL Intracatheter Q12H  . sodium chloride flush  3 mL Intravenous Q12H  . sodium chloride flush  3 mL Intravenous Q12H    Infusions: .  prismasol BGK 4/2.5 500 mL/hr at 01/22/21 0020  .  prismasol BGK 4/2.5 300 mL/hr at 01/21/21 0730  . sodium chloride    . amiodarone 60 mg/hr (01/22/21 0700)  . heparin 1,250 Units/hr (01/22/21 0700)  . milrinone 0.125 mcg/kg/min (01/22/21 0700)  . norepinephrine (LEVOPHED) Adult  infusion 10 mcg/min (01/22/21 0700)  . prismasol BGK 4/2.5 1,500 mL/hr at 01/22/21 0629    PRN Medications: sodium chloride, acetaminophen **OR** acetaminophen, albuterol, ALPRAZolam, azelastine, dextromethorphan-guaiFENesin, fentaNYL (SUBLIMAZE) injection, heparin, methocarbamol, Muscle Rub, ondansetron **OR** ondansetron (ZOFRAN) IV, sodium chloride flush, sodium chloride flush, traMADol  Assessment/Plan   1. Acute on chronic systolic CHF: Cause of cardiomyopathy uncertain.  Patient has history of 1-vessel CAD with 75% OM3 stenosis on 11/17 cath.  Cannot rule out tachy-mediated CMP.  Echo this admission with EF 25-30%, severe LVH, moderately decreased RV systolic function, mild-moderate MR, stable TAVR valve with mean gradient 12 mmHg, IVC dilated. RHC with elevated RA pressure and PA pressure, unable to get PCWP, CI 2.2.   Diuresis has been attempted but progressed poorly on Lasix gtt 20 mg/hr + metolazone 5 bid with rising creatinine.  CVVH started 3/27.  Currently, UF 75 cc/hr net.  CVP 14 with stable CO-OX   Weight down another 3 pounds.  - Dont think milrinone is adding much. Stop milrinone. .  - Continue NE to maintain MAP with CVVH.  - Will aim for TEE-guided DCCV in the future to get her back into NSR but think she needs some fluid off first to hold NSR long-term.  - Has wide LBBB, may be CRT candidate down the road but would need significant stabilization.  2. CAD: LHC in 11/17 with 75% OM3.  Not candidate for coronary angiography at this point with AKI. No chest pain.  - Continue statin.  3. Atrial fibrillation: History of PAF, admitted here with AF/RVR, HR in 110s generally.  This likely contributes to CHF exacerbation. Has not been on anticoagulation due to history of recurrent GI bleeding.  HR remains elevated today on amiodarone gtt but better in 100s.  - Continue amiodarone 60 mg/hr.  - Now on heparin gtt.   - Hopefully will get to TEE-guided DCCV once CHF is better managed.   Suspect she will be able to tolerate apixaban for at least a month or so post-DCCV.  4. AKI on CKD stage IIIb: Creatinine up to 3.58 with poor diuresis.  Suspect cardiorenal syndrome.  Poor response to high dose diuretics.  I think she would be a poor candidate for long-term HD with advanced HF, but reasonable to try short-term CVVH to see if she can stabilize with volume removal acutely.  - Nephrology following, CVVH as above. 5. H/o TAVR: Bioprosthetic AoV stable on 3/22 echo.  6. COPD, OHS: On home oxygen at baseline.  7. Deconditioning -PT/OT following.   Palliative care following.  Long-term HD would be difficult.    Length of Stay: 6  [name]  Clegg, NP  01/22/2021, 7:31 AM  Advanced Heart Failure Team Pager [phone] (M-F; 7a - 5p)  Please contact [facility] for night-coverage after hours (5p -7a ) and weekends on [email or web address]  Patient seen with NP, agree with the above note.   She was short of breath overnight, feels better now. Weight down 3 lbs, CVP still elevated at 14.  Co-ox 71% on milrinone + NE 10.   General: NAD Neck: JVP difficult but appears 12+, no thyromegaly or thyroid nodule.  Lungs: Clear to auscultation bilaterally with normal respiratory effort. CV: Nondisplaced PMI.  Heart mildly tachy, irregular S1/S2, no S3/S4, no murmur.  1+ ankle edema.   Abdomen: Soft, nontender, no hepatosplenomegaly, no distention.  Skin: Intact without lesions or rashes.  Neurologic: Alert and oriented x 3.  Psych: Normal affect. Extremities: No clubbing or cyanosis.  HEENT: Normal.   Continue CVVH today with gentle UF 75-100 cc/hr net negative as CVP still up.  Can stop milrinone.  Continue midodrine + NE to maintain MAP with CVVH.  Increase midodrine to 15 tid.   Continue heparin gtt with amiodarone gtt for now.  Once she has more volume off (hopefully tomorrow), will plan TEE-guided DCCV.  Hgb ok at 9.8, no overt bleeding.   Long-term, iHD would be difficult for her due to  advanced HF.  Still making some urine, will need to see if she can manage with oral diuretics once volume off with CVVH.   CRITICAL CARE Performed by: [name]  Total critical care time: 35 minutes  Critical care time was exclusive of separately billable procedures and treating other patients.  Critical care was necessary to treat or prevent imminent or life-threatening deterioration.  Critical care was time spent personally by me on the following activities: development of treatment plan with patient and/or surrogate as well as nursing, discussions with consultants, evaluation of patient's response to treatment, examination of patient, obtaining history from patient or surrogate, ordering and performing treatments and interventions, ordering and review of laboratory studies, ordering and review of radiographic studies, pulse oximetry and re-evaluation of patient's condition.  [name] 01/22/2021 8:18 AM

## 2021-01-22 NOTE — Progress Notes (Signed)
Daily Progress Note   Patient Name: Patricia [name]       Date: 01/22/2021 DOB: May 19, 1936  Age: 85 y.o. MRN#: [id number] Attending Physician: Kipp Brood, MD Primary Care Physician: [name], NP Admit Date: 01/23/2021  Reason for Consultation/Follow-up: Establishing goals of care  Subjective: Chart reviewed. Patient transferred to [facility] and CRRT started 3/27. She remains on amiodarone and heparin infusions. Milrinone discontinued.   Spoke with primary RN - norepinephrine is currently at 12 mcg/hr. Patient has been sleeping most of the day. Oral intake is minimal. She has been complaining of nausea today.  At bedside, patient continues to report nausea. Also asking to be pulled up in the bed - I assist RN with repositioning.   I called [name] and left a message requesting a return call.   Length of Stay: 6  Current Medications: Scheduled Meds:  . Chlorhexidine Gluconate Cloth  6 each Topical Daily  . fluticasone furoate-vilanterol  1 puff Inhalation Daily   And  . umeclidinium bromide  1 puff Inhalation Daily  . insulin aspart  0-5 Units Subcutaneous QHS  . insulin aspart  0-9 Units Subcutaneous TID WC  . levothyroxine  88 mcg Oral Q0600  . midodrine  15 mg Oral TID WC  . pantoprazole  40 mg Oral BID  . QUEtiapine  12.5 mg Oral QHS  . rosuvastatin  10 mg Oral Daily  . sodium chloride flush  10-40 mL Intracatheter Q12H  . sodium chloride flush  3 mL Intravenous Q12H  . sodium chloride flush  3 mL Intravenous Q12H    Continuous Infusions: .  prismasol BGK 4/2.5 500 mL/hr at 01/22/21 1640  .  prismasol BGK 4/2.5 300 mL/hr at 01/22/21 1757  . sodium chloride    . amiodarone 60 mg/hr (01/22/21 1800)  . heparin 1,300 Units/hr (01/22/21 1800)  . norepinephrine  (LEVOPHED) Adult infusion 12 mcg/min (01/22/21 1800)  . prismasol BGK 4/2.5 1,500 mL/hr at 01/22/21 1530    PRN Meds: sodium chloride, acetaminophen **OR** acetaminophen, albuterol, ALPRAZolam, azelastine, dextromethorphan-guaiFENesin, docusate sodium, fentaNYL (SUBLIMAZE) injection, heparin, methocarbamol, Muscle Rub, ondansetron **OR** ondansetron (ZOFRAN) IV, polyethylene glycol, sodium chloride flush, sodium chloride flush, traMADol  Physical Exam Constitutional:      Appearance: She is obese. She is ill-appearing.  Cardiovascular:     Rate and Rhythm:  Normal rate.     Comments: A fib Pulmonary:     Effort: Pulmonary effort is normal.  Neurological:     Mental Status: She is alert and oriented to person, place, and time.     Motor: Weakness present.             Vital Signs: BP (!) 100/47   Pulse (!) 110   Temp 98.5 F (36.9 C) (Axillary)   Resp (!) 24   Ht _0  (1.575 m)   Wt 95.7 kg   SpO2 100%   BMI 38.59 kg/m  SpO2: SpO2: 100 % O2 Device: O2 Device: Nasal Cannula O2 Flow Rate: O2 Flow Rate (L/min): 4 L/min  Intake/output summary:   Intake/Output Summary (Last 24 hours) at 01/22/2021 1805 Last data filed at 01/22/2021 1800 Gross per 24 hour  Intake 1814.84 ml  Output 3598 ml  Net -1783.16 ml   LBM: Last BM Date: 01/05/2021 Baseline Weight: Weight: 102.9 kg Most recent weight: Weight: 95.7 kg       Palliative Assessment/Data: PPS 20-30%       Palliative Care Assessment & Plan   HPI/Patient Profile: 85 y.o. female  with past medical history of chronic systolic CHF, chronic respiratory failure with hypoxia on 2L oxygen at home, paroxysmal atrial fibrillation, COPD, s/p TAVR, CKD stage IV, type 2 DM, HTN, hypothyroidism, HLD, LBBB, and OHS not on CPAP. She presented to the emergency department on 01/19/2021 with shortness of breath. In the ED, chest x-ray with pulmonary edema and pleural effusions. BNP 1600. Admitted to [facility] for acute on chronic systolic CHF.  Cardiology and PCCM consulted.  Echo 3/23 shows worsening EF (25-30% from prior of 40-45% in January 2021).  Assessment: - acute on chronic systolic CHF - CAD, not a candidate for coronary angiography due to AKI - atrial fibrillation - AKI on CKD stage IIIb, suspected cardiorenal syndrome - history of TAVR - COPD, OHS  Recommendations/Plan:  Full code full scope  Continue current medical treatment  Patient remains high risk for continued decompensation secondary to her multiple comorbidities.  Continued education and conversation with patient and daughter regarding the seriousness of her current medical situation and overall poor prognosis  PMT will continue to follow  Goals of Care and Additional Recommendations:  Limitations on Scope of Treatment: Full Scope Treatment  Code Status: Full  Prognosis:   Unable to determine  Discharge Planning:  To Be Determined  Care plan was discussed with bedside RN  Thank you for allowing the Palliative Medicine Team to assist in the care of this patient.   Total Time 15 minutes Prolonged Time Billed  no       Greater than 50%  of this time was spent counseling and coordinating care related to the above assessment and plan.  Lavena Bullion, NP  Please contact Palliative Medicine Team phone at [phone] for questions and concerns.

## 2021-01-22 NOTE — Progress Notes (Signed)
[facility] Progress Note    Assessment/ Plan:   AKI on CKD 4 -Baseline creatinine seems to range around 1.6-2 with underlying proteinuria.  Suspect is a component of diabetic kidney disease and hypertensive arterionephrosclerosis -AKI likely related to cardiorenal syndrome -Initiated CRRT 3/27 via temp cath in setting of worsening renal function and volume overload refractory to max medical therapies; running ok after addition of heparin -Off lasix gtt given lack of response, UFR net neg 173m/hr which I think is appropriate at this time -Ongoing disucssion re: feasibility and utility of long term RRT in settting of numerous comorbidities; palliative care is aiding in discussion, appreciated.  For now we are hopeful that off loading volume and cardioversion may restore renal perfusion and improve function to liberate from RRT -Continue to avoid nephrotoxins and dose meds for CRRT  Acute decompensated biventricular failure -Cardio on board, status post RHC 01/19/2021.  On NE (started after CRRT), d/c'd milrinone today. CVVHD cont for UF -Palliative on board  Afib w/ RVR -on amio gtt -tee guided dccv once volume has improved - perhaps tomorrow  Acute on chronic hypoxic and hypercapnic respiratory failure, history of OHS, COPD -Also with acute pulmonary edema -Pulmonology has consulted  Hypotension, history of hypertension -Likely secondary to poor cardiac output - on milrinone and NE  DM2 with hyperglycemia -Management per primary service  Anemia of chronic disease and chronic IDA - transfuse for hemoglobin less than 7, per primary  Subjective:   CRRT continues - I/Os 2.3 / 3.4, net neg 1.2L, UOP 1086mFeels poorly NOS today.  Wishes to continue current scope of care. Cardiology d/c milrinone     Objective:   BP 114/60   Pulse (!) 119   Temp 98 F (36.7 C) (Oral)   Resp 18   Ht 5' 2" (1.575 m)   Wt 95.7 kg   SpO2 100%   BMI 38.59 kg/m    Intake/Output Summary (Last 24 hours) at 01/22/2021 1029 Last data filed at 01/22/2021 0900 Gross per 24 hour  Intake 2215.8 ml  Output 3650 ml  Net -1434.2 ml   Weight change: -1.3 kg  Physical Exam: Gen:nad, sitting in bedon CRRT  CVS:  irregularly irregular Resp: slightly diminished air entry bibasilar posteriorly Abd: Obese, soft, nontender Ext: 1+ pitting edema bilateral lower extremities Neuro: Speech and coherent, moves all extremities spontaneously  Imaging: DG CHEST PORT 1 VIEW  Result Date: 01/20/2021 CLINICAL DATA:  Central line placement EXAM: PORTABLE CHEST 1 VIEW COMPARISON:  01/17/2021 FINDINGS: A LEFT IJ central venous catheter is noted with tips overlying the mid-LOWER SVC. Cardiomegaly and aortic valve replacement noted. Pulmonary vascular congestion with interstitial edema and RIGHT pleural effusion again identified. There may be a trace LEFT pleural effusion present. There is no evidence of pneumothorax. IMPRESSION: LEFT IJ central venous catheter with tips overlying the mid-LOWER SVC. No other significant change. No pneumothorax. Electronically Signed   By: JeMargarette Canada.D.   On: 01/20/2021 14:13    Labs: BMET Recent Labs  Lab 01/08/2021 0403 01/20/2021 1543 01/19/21 0223 01/20/21 0414 01/20/21 1638 01/21/21 0357 01/21/21 1644 01/22/21 0448  NA 136 139  139 136 133* 132* 134* 130* 134*  K 4.0 3.8  3.9 3.8 3.5 3.2* 3.6 4.0 4.4  CL 93*  --  94* 92* 92* 96* 95* 100  CO2 32  --  33* 27 32 _0 GLUCOSE 157*  --  183* 203* 176* 172* 223* 205*  BUN 81*  --  88* 98* 77* 45* 29* 19  CREATININE 3.00*  --  3.14* 3.54* 2.74* 2.03* 1.63* 1.61*  CALCIUM 8.5*  --  8.5* 8.2* 7.9* 8.1* 8.1* 8.3*  PHOS  --   --  6.6*  --  4.2 2.9 2.3* 2.6   CBC Recent Labs  Lab 01/08/2021 1904 01/16/21 0437 01/17/21 0318 01/03/2021 1543 01/19/21 0928 01/20/21 0414 01/21/21 0357 01/22/21 0448  WBC 5.8   < > 5.6  --  5.5 6.5 6.4 8.1  NEUTROABS 5.3  --  4.2  --   --   --   --    --   HGB 10.2*   < > 9.4*   < > 9.5* 9.0* 9.3* 9.8*  HCT 34.2*   < > 29.9*   < > 31.8* 28.2* 30.0* 31.9*  MCV 95.3   < > 94.0  --  94.9 91.9 93.8 95.2  PLT 150   < > 133*  --  126* 126* 144* 162   < > = values in this interval not displayed.    Medications:    . Chlorhexidine Gluconate Cloth  6 each Topical Daily  . fluticasone furoate-vilanterol  1 puff Inhalation Daily   And  . umeclidinium bromide  1 puff Inhalation Daily  . insulin aspart  0-5 Units Subcutaneous QHS  . insulin aspart  0-9 Units Subcutaneous TID WC  . levothyroxine  88 mcg Oral Q0600  . midodrine  15 mg Oral TID WC  . pantoprazole  40 mg Oral BID  . QUEtiapine  12.5 mg Oral QHS  . rosuvastatin  10 mg Oral Daily  . sodium chloride flush  10-40 mL Intracatheter Q12H  . sodium chloride flush  3 mL Intravenous Q12H  . sodium chloride flush  3 mL Intravenous Q12H      Jannifer Hick MD [facility] Pager [phone]

## 2021-01-22 NOTE — Progress Notes (Signed)
NAME:  Patricia [name], MRN:  [id number], DOB:  02-22-1936, LOS: 6 ADMISSION DATE:  01/05/2021, CONSULTATION DATE:  01/22/21 REFERRING MD:  Starla Link - TRH, CHIEF COMPLAINT:  SOB  HPI/course in hospital  85 yo F with a relevant PMH of OSH c/b COPD and hypoxia (baseline home O2 2L) with pulmonary HTN by RHC (3/25), CKD IV, Afib, CAD w/ HFref (3/23 EF: 25-30% vs recent baseline 40-45%), s/p TAVR (09/2016), T2DM, and hx of chronic GI bleed.   On admission, patient described increased SOB on 2L O2 at home, bilateral lower extremity pitting edema, and fluctuating/elevated HR. Describes process as "long time in making". BNP 1681, CXR with bilateral pleural effusions and pulmonary edema. Admitted to [facility] for acute heart failure exacerbation. Troponin trend at that time not c/w ACS. Got 80m lasix in ED, and total of 1411mLasix 3/23, lasix w/ metolazone, without a robust response. ECHO obtained 3/23 which revealed LVEF 25-30%, LVH, reduced RV systolic function but normal RV size. Severely dilated LA. Moderately dilated RA.     3/24 Cr has bumped and lasix being held. Cardiology suggests PCCM consult to evaluate SOB for pulmonary etiology of SOB. 3/26: Transfer to ICU for initiation of CRRT 3/27: CRRT w/ CVHHD 3/28: continue CVVHD for kidney recover and euvolemia  3/29: weight 98.5 >> 95.7 kg, increased inotropic support required ON     Past Medical History   Past Medical History:  Diagnosis Date  . Allergy   . Anemia, unspecified   . Arthritis   . Asthma   . [facility]   s/p TEE/DCCV>>SR on amio  . Benign neoplasm of colon   . Carpal tunnel syndrome   . Cellulitis and abscess of finger, unspecified   . Cerumen impaction   . Cervicalgia   . CHF (congestive heart failure) ([place]  . Chronic airway obstruction, not elsewhere classified   . Chronic anticoagulation 09/2015   Eliquis  . Diarrhea   . Diffuse cystic mastopathy   . Edema   . Encounter  for long-term (current) use of other medications   . GERD (gastroesophageal reflux disease)   . Heart murmur   . Lumbago   . Neck pain 05/23/2015  . Obesity, unspecified   . Other and unspecified hyperlipidemia   . Other psoriasis   . Pain in joint, lower leg   . PONV (postoperative nausea and vomiting)   . Primary localized osteoarthrosis of right shoulder 12/18/2015  . 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 Scoliosis (and kyphoscoliosis), idiopathic   . Shortness of breath dyspnea    with exertion  . Type II or unspecified type diabetes mellitus without mention of complication, uncontrolled   . Unspecified essential hypertension   . Unspecified hemorrhoids without mention of complication   . Unspecified hypothyroidism   . Urge incontinence      Past Surgical History:  Procedure Laterality Date  . ABDOMINAL HYSTERECTOMY  1987   complete  . BREAST BIOPSY Right   . BREAST EXCISIONAL BIOPSY Left 2011  . BREAST EXCISIONAL BIOPSY Right    x2  . BREAST SURGERY     right breast 3 surgeries [id number]. CARDIAC CATHETERIZATION N/A 09/02/2016   Procedure: Right/Left Heart Cath and Coronary Angiography;  Surgeon: [name] M JoMartiniqueMD;  [place];  Service: Cardiovascular;  Laterality: N/A;  . CARDIOVERSION  N/A 10/19/2015   Procedure: CARDIOVERSION;  Surgeon: Lelon Perla, MD;  Location: [facility] ENDOSCOPY;  Service: Cardiovascular;  Laterality: N/A;  . CARDIOVERSION N/A 11/13/2017   Procedure: CARDIOVERSION;  Surgeon: Jerline Pain, MD;  Location: [facility] ENDOSCOPY;  Service: Cardiovascular;  Laterality: N/A;  . CARDIOVERSION N/A 07/28/2019   Procedure: CARDIOVERSION;  Surgeon: Buford Dresser, MD;  Location: [place];  Service: Cardiovascular;  Laterality: N/A;  . [place]  . COLON SURGERY     2004,2007,2013.   3 times colon surgieres  . ESOPHAGOGASTRODUODENOSCOPY (EGD) WITH PROPOFOL Left 03/13/2017   Procedure: ESOPHAGOGASTRODUODENOSCOPY (EGD) WITH PROPOFOL;  Surgeon: Ronnette Juniper, MD;  Location: [place];  Service: Gastroenterology;  Laterality: Left;  . ESOPHAGOGASTRODUODENOSCOPY (EGD) WITH PROPOFOL Left 01/22/2018   Procedure: ESOPHAGOGASTRODUODENOSCOPY (EGD) WITH PROPOFOL;  Surgeon: Arta Silence, MD;  Location: [facility] ENDOSCOPY;  Service: Endoscopy;  Laterality: Left;  . EXCISE LE MANDIBULAR LYMPH NODE T     DR C. NEWMAN  . FOOT SURGERY  1989  . GIVENS CAPSULE STUDY Left 01/23/2018   Procedure: GIVENS CAPSULE STUDY;  Surgeon: Wilford Corner, MD;  Location: [facility] ENDOSCOPY;  Service: Endoscopy;  Laterality: Left;  . LEFT SHOULDER ARTHROSCOPY    . 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 NEUROPLASTY / TRANSPOSITION MEDIAN NERVE AT CARPAL TUNNEL BILATERAL  05/2003  . RIGHT HEART CATH N/A 01/13/2021   Procedure: RIGHT HEART CATH;  Surgeon: Leonie Man, MD;  Location: [place] CV LAB;  Service: Cardiovascular;  Laterality: N/A;  . TEE WITHOUT CARDIOVERSION N/A 10/19/2015   Procedure: Transesophageal Echocardiogram (TEE) ;  Surgeon: Lelon Perla, MD;  Location: [facility] ENDOSCOPY;  Service: Cardiovascular;  Laterality: N/A;  . TEE WITHOUT CARDIOVERSION N/A 10/14/2016   Procedure: TRANSESOPHAGEAL ECHOCARDIOGRAM (TEE);  Surgeon: Sherren Mocha, MD;  Location: [place];  Service: Open Heart Surgery;  Laterality: N/A;  . TEE WITHOUT CARDIOVERSION N/A 07/28/2019   Procedure: TRANSESOPHAGEAL ECHOCARDIOGRAM (TEE);  Surgeon: Buford Dresser, MD;  Location: [facility] ENDOSCOPY;  Service: Cardiovascular;  Laterality: N/A;  . TOTAL SHOULDER ARTHROPLASTY Right 12/18/2015   Procedure: TOTAL SHOULDER ARTHROPLASTY;  Surgeon: [name], MD;  Location: [place];  Service: Orthopedics;  Laterality: Right;  . TRANSCATHETER AORTIC VALVE REPLACEMENT, TRANSFEMORAL N/A 10/14/2016   Procedure: TRANSCATHETER AORTIC VALVE  REPLACEMENT, TRANSFEMORAL;  Surgeon: Sherren Mocha, MD;  Location: [place];  Service: Open Heart Surgery;  Laterality: N/A;  . TRIGGER FINGER RELEASE Left   . VAGINAL CYST REMOVED  1967     Review of Systems:   ROS  Social History   reports that she quit smoking about 31 years ago. She has a 40.00 pack-year smoking history. She has never used smokeless tobacco. She reports that she does not drink alcohol and does not use drugs.   Family History   Her family history includes Arthritis in her sister; Asthma in her sister; Diabetes in her father; Heart disease in her brother and father; Liver cancer in her sister; Stroke in her father. There is no history of Breast cancer.   Allergies Allergies  Allergen Reactions  . Codeine Other (See Comments)    "Spaces out" the patient and she cannot walk well  . Vesicare [Solifenacin] Itching     Home Medications  Prior to Admission medications   Medication Sig Start Date End Date Taking? Authorizing Provider  acetaminophen (TYLENOL) 650 MG CR tablet Take 1,300 mg by mouth every 8 (eight) hours as needed for pain.   Yes [provider]  albuterol (PROVENTIL) (2.5 MG/3ML) 0.083% nebulizer solution USE 1 VIAL IN NEBULIZER EVERY 4 HOURS AND AS NEEDED. Generic: VENTOLIN Patient taking differently: Take 2.5 mg by nebulization every 4 (four) hours as needed for shortness of breath or wheezing. 07/12/20  Yes Brand Males, MD  albuterol (VENTOLIN HFA) 108 (90 Base) MCG/ACT inhaler INHALE 2 PUFFS INTO THE LUNGS EVERY 6 HOURS AS NEEDED FOR WHEEZING OR SHORTNESS OF BREATH Patient taking differently: Inhale 2 puffs into the lungs every 6 (six) hours as needed for shortness of breath or wheezing. 10/10/20  Yes [name], NP  amiodarone (PACERONE) 200 MG tablet Take 1 tablet (200 mg total) by mouth daily. 06/29/20  Yes Martinique, [name] M, MD  azelastine (ASTELIN) 0.1 % nasal spray Place 1 spray into both nostrils 2 (two) times daily. Use in each  nostril as directed Patient taking differently: Place 1 spray into both nostrils 2 (two) times daily as needed. Use in each nostril as directed 03/02/20  Yes Martyn Ehrich, NP  Blood Glucose Monitoring Suppl [facility] VERIO) w/Device KIT Check blood sugar twice daily DX:E11.9 10/12/19  Yes [name], NP  cetirizine (ZYRTEC) 10 MG tablet Take 10 mg by mouth daily as needed for allergies.    Yes [provider]  colestipol (COLESTID) 1 g tablet Take 1 g by mouth 2 (two) times daily. 06/29/20  Yes [provider]  Dextromethorphan-guaiFENesin (MUCINEX DM) 30-600 MG TB12 Take 2 tablets by mouth as needed (cough).   Yes [provider]  doxazosin (CARDURA) 4 MG tablet TAKE 1 TABLET BY MOUTH EVERY DAY TO HELP CONTROL BLOOD PRESSURE Patient taking differently: Take 4 mg by mouth daily. 09/12/20  Yes [name], NP  ferrous sulfate 325 (65 FE) MG EC tablet Take 325 mg by mouth 2 (two) times daily.   Yes [provider]  furosemide (LASIX) 40 MG tablet TAKE 2 TABLETS BY MOUTH DAILY AND 1 TABLET BY MOUTH AT LUNCH on  Mondays, Wednesdays and Fridays Patient taking differently: Take 40-80 mg by mouth See admin instructions. 2 tablets in the morning  daily. Take 1 tablet at lunch on m,w,f in addition to morning dose 09/17/20  Yes Eubanks, Carlos American, NP  glimepiride (AMARYL) 1 MG tablet TAKE 1 TABLET(1 MG) BY MOUTH DAILY WITH BREAKFAST Patient taking differently: Take 1 mg by mouth daily with breakfast. 11/19/20  Yes [name], NP  glucose blood test strip 1 each by Other route 2 (two) times daily. OneTouch Verio DX E11.8 10/12/19  Yes Eubanks, Carlos American, NP  JANUVIA 50 MG tablet TAKE 1 TABLET(50 MG) BY MOUTH DAILY Patient taking differently: Take 50 mg by mouth daily. 10/10/20  Yes [name], NP  Lancets [facility] ULTRASOFT) lancets 1 each by Other route 2 (two) times daily. OneTouch Verio DX E11.9 10/12/19  Yes [name], NP   levothyroxine (SYNTHROID) 88 MCG tablet TAKE 1 TABLET BY MOUTH DAILY ON AN EMPTY STOMACH Patient taking differently: Take 88 mcg by mouth daily before breakfast. 08/29/20  Yes Eubanks, Carlos American, NP  OVER THE COUNTER MEDICATION Place 1 drop into both eyes daily. [facility] brand lubricating eye drop   Yes [provider]  OXYGEN Inhale 2 L into the lungs continuous. Pt also uses 4L pulse via POC   Yes [provider]  pantoprazole (PROTONIX) 40 MG tablet TAKE 1 TABLET(40 MG) BY MOUTH TWICE DAILY Patient taking differently: Take 40 mg by mouth 2 (two) times daily. 07/11/20  Yes [name], NP  potassium gluconate 595 (99 K) MG TABS tablet Take 595 mg by mouth 2 (two) times daily.   Yes [provider]  predniSONE (DELTASONE) 10 MG tablet Take 4 tabs po daily x 3 days; then 3 tabs daily x3 days; then 2 tabs daily x3 days; then 1 tab daily x 3 days; then stop 01/14/21  Yes Martyn Ehrich, NP  rosuvastatin (CRESTOR) 10 MG tablet TAKE 1 TABLET(10 MG) BY MOUTH DAILY Patient taking differently: Take 10 mg by mouth daily. 10/31/20  Yes [name], NP  TRELEGY ELLIPTA 200-62.5-25 MCG/INH AEPB INHALE 1 PUFF INTO THE LUNGS DAILY 10/15/20  Yes Martyn Ehrich, NP  cholecalciferol (VITAMIN D) 1000 units tablet Take 1,000 Units by mouth at bedtime.     [provider]     Interim history/subjective:  Patient tired this morning. Yesterday evening reported significantly improved SOB and denied pain. This morning describes "back pain" referring to her "tailbone" where there is c/f sacral decubitus ulcer. Breakfast essentially not eaten this morning. No BM since 3/25 but patient passing flatus. Patient agreed to try bowel regimen. Nurse at bedside describes no new concerns except increased inotropic support.   Objective   Blood pressure 96/74, pulse (!) 117, temperature 98 F (36.7 C), temperature source Oral, resp. rate 19, height _0  (1.575 m), weight 95.7  kg, SpO2 97 %. CVP:  [14 mmHg-21 mmHg] 14 mmHg      Intake/Output Summary (Last 24 hours) at 01/22/2021 0803 Last data filed at 01/22/2021 0700 Gross per 24 hour  Intake 2077.95 ml  Output 3686 ml  Net -1608.05 ml   Filed Weights   01/20/21 0000 01/21/21 0530 01/22/21 0500  Weight: 98.5 kg 97 kg 95.7 kg    Examination: Gen: NAD, tired this morning, conversing readily, [place] in place, some evidence of deconditioning and weakness  HEENT: EOMI, MMM, anicteric sclera  CV: irregular S1/S2, no m/r/g, JVD trace (approx 8 cm), trace peripheral edema with elastic skin, cap refill <2sec  Pulm: CTAB in upper fields, moving air appropriately  GI: NTND, soft, bowel sounds active  Neuro: Moves extremities at least against gravity - not formally assessed  Skin: Central line in place w/out e/d/i    Ancillary tests (personally reviewed)  CBC: Recent Labs  Lab 12/28/2020 1904 01/16/21 0437 01/17/21 0318 12/31/2020 1543 01/19/21 0928 01/20/21 0414 01/21/21 0357 01/22/21 0448  WBC 5.8   < > 5.6  --  5.5 6.5 6.4 8.1  NEUTROABS 5.3  --  4.2  --   --   --   --   --   HGB 10.2*   < > 9.4* 9.9*  10.2* 9.5* 9.0* 9.3* 9.8*  HCT 34.2*   < > 29.9* 29.0*  30.0* 31.8* 28.2* 30.0* 31.9*  MCV 95.3   < > 94.0  --  94.9 91.9 93.8 95.2  PLT 150   < > 133*  --  126* 126* 144* 162   < > = values in this interval not displayed.    Basic Metabolic Panel: Recent Labs  Lab 01/21/2021 0403 12/28/2020 1543 01/19/21 0223 01/20/21 0414 01/20/21 1638 01/21/21 0357 01/21/21 1644 01/22/21 0448  NA 136   < > 136 133* 132* 134* 130* 134*  K 4.0   < > 3.8 3.5 3.2* 3.6 4.0 4.4  CL 93*  --  94* 92* 92* 96* 95* 100  CO2 32  --  33* 27 32 29 29 29  GLUCOSE 157*  --  183* 203* 176* 172* 223* 205*  BUN 81*  --  88* 98* 77* 45* 29* 19  CREATININE 3.00*  --  3.14* 3.54* 2.74* 2.03* 1.63* 1.61*  CALCIUM 8.5*  --  8.5* 8.2* 7.9* 8.1* 8.1* 8.3*  MG 2.0  --  1.9 1.9  --  2.1  --  2.4  PHOS  --   --  6.6*  --  4.2 2.9 2.3*  2.6   < > = values in this interval not displayed.   GFR: Estimated Creatinine Clearance: 29 mL/min (A) (by C-G formula based on SCr of 1.61 mg/dL (H)). Recent Labs  Lab 01/19/21 0928 01/20/21 0414 01/21/21 0357 01/22/21 0448  WBC 5.5 6.5 6.4 8.1    Liver Function Tests: Recent Labs  Lab 01/21/2021 1904 01/16/21 1656 01/19/21 0223 01/20/21 1638 01/21/21 0357 01/21/21 1644 01/22/21 0448  AST 29 14*  --   --   --   --   --   ALT 18 13  --   --   --   --   --   ALKPHOS 55 50  --   --   --   --   --   BILITOT 0.7 0.6  --   --   --   --   --   PROT 7.1 6.4*  --   --   --   --   --   ALBUMIN 3.8 3.4* 3.3* 3.3* 3.2* 3.3* 3.4*   No results for input(s): LIPASE, AMYLASE in the last 168 hours. No results for input(s): AMMONIA in the last 168 hours.  ABG    Component Value Date/Time   PHART 7.366 06/12/2020 1018   PCO2ART 57.9 (H) 06/12/2020 1018   PO2ART 84.9 06/12/2020 1018   HCO3 35.3 (H) 01/07/2021 1543   HCO3 34.5 (H) 01/07/2021 1543   TCO2 37 (H) 01/02/2021 1543   TCO2 37 (H) 01/01/2021 1543   O2SAT 71.0 01/22/2021 0449     Coagulation Profile: No results for input(s): INR, PROTIME in the last 168 hours.  Cardiac Enzymes: No results for input(s): CKTOTAL, CKMB, CKMBINDEX, TROPONINI in the last 168 hours.  HbA1C: Hgb A1c MFr Bld  Date/Time Value Ref Range Status  01/16/2021 04:37 AM 7.1 (H) 4.8 - 5.6 % Final    Comment:    (NOTE) Pre diabetes:          5.7%-6.4%  Diabetes:              >6.4%  Glycemic control for   <7.0% adults with diabetes   09/17/2020 12:00 AM 7.3 (H) <5.7 % of total Hgb Final    Comment:    For someone without known diabetes, a hemoglobin A1c value of 6.5% or greater indicates that they may have  diabetes and this should be confirmed with a follow-up  test. . For someone with known diabetes, a value <7% indicates  that their diabetes is well controlled and a value  greater than or equal to 7% indicates suboptimal  control. A1c  targets should be individualized based on  duration of diabetes, age, comorbid conditions, and  other considerations. . Currently, no consensus exists regarding use of hemoglobin A1c for diagnosis of diabetes for children. .     CBG: Recent Labs  Lab 01/21/21 0651 01/21/21 1147 01/21/21 1644 01/21/21 2105 01/22/21 0654  GLUCAP 173* 209* 219* 219* 183*     Assessment & Plan:  52 yoF in guarded condition that remains  moderately ill with a relevant PMH of OSH c/b COPD and hypoxia (baseline home O2 2L) with pulmonary HTN by RHC (3/25), CKD IV, Afib, CAD w/ HFref (3/23 EF: 25-30% vs recent baseline 40-45%), s/p TAVR (09/2016), T2DM, and hx of chronic GI bleed. Yesterday evening around 1700, patient appeared much improved. Tired this morning but appears to have less WOB. CVP remains elevated. HDS on significant inotropic support, worsened from yesterday.   AKI on CKD IV Anuric AKI likely multifactorial etiology with components of prerenal and cardiorenal syndrome given decompensated HFrEF c/b Afib w/ RVR and microvascular disease (T2DM). - Continue CRRT w/ CVVHD 300 mL/hr   - Goal to increase fluid removal today - can increase NE to 12 mcg/min to support diuresis   - CVP remains elevated - goal closer to 10 as recent dry weight CVP not known    - Discontinue milrinone given svO2 remains w/in goal, tachycardia and low systolic blood pressures  - Maintain MAP >70 for optimal renal perfusion - Continue to trend renal function; consider svO2 PRN      - Most recent Coox remained w/in goal range  - Avoid further nephrotoxins including NSAIDS, Morphine. Unless absolutely necessary, avoid CT with contrast and/or MRI with gadolinium.    - Currently no indication for HD. However, not a good candidate long term iHD.  - Nephrology following, appreciate recommendations   Acute Excaberation on Chronic HFrEF, s/p TAVR (2017) CHF excerebration likely multifactorial with considerations towards  worsening Afib w/ RVR and features of MR.  - Wean NE as tolerated but prioritize fluid removal by CRRT     - Wean overnight unsuccessful - pt required increased NE support  - Increased midodrine for BP support  -  As above, d/c Milrinone  - As above, CVP remains elevated  - CXR with fewer interstitial infiltrates, particularly on right, and improved visualization of costophrenic angles than prior (3/27) although significant peribronchial cuffing remains     - Correlates to decreased JVD      - Correlates to patient report of decreased WOB - Repeat BNP for trend  - Prior to d/c, consider optimization of T2DM medications with the DPP4-inhibitor Celesta Gentile) being substituted for a SGLT-2 inhibitor  - Cardiology following, appreciate recommendations   OSH, COPD, Acute on Chronic Hypercapnic Respiratory Failure  Baseline O2 requirement at home 2L. C/f potential VTE disease, but most likely cause of pulmonary HTN 2/2 CHF (class II) given echo demonstrated moderate MR and diastolic/systolic dysfunctoin, although RHC was not able to confirm via PCWP.  - Titrate O2 sats 88-92% on O2  - Patient refused CPAP, CTM  - Outstanding NM pulmonary perfusion & ventilation scan prior to d/c - Continue Breo Ellipta and Incruse Ellipta  - Pulmonology following, appreciate recommendations  Afib w/ RVR -Continue Amioradione gtt - Per cards, suggest TEE guided DCCV once CHF has improved - Determine long-term anticoagulation goal due to d/c of anticoagulation 2/2 chronic GI bleed   Normocytic Anemia   Likely 2/2 chronic GI bleed, AICD, and CKD. Iron studies c/w AICD given elevated ferritin and low-normal TIBC (Fe wnl).  - Trend Hb  - Consider transfusion if <8 given need for optimal perfusion   Hx of GI Bleed - Occult chronic GI bleed with unidentified source by capsule (2019)  - Trend Hb  - CTM for evidence of change or acute hemorrhage while heparinized   Constipation   - No BM since 3/25, passing flatus   - Miralax PRN   C/f  Sacral Decubitus Ulcer  - CTM rotations  - Wound NT consulted   Goals of Care  - Patient has expressed goal of seeing birth of great grandchild in October   - Palliative following, appreciate recommendations   Chronic Conditions T2DM - Continue SSI while in ICU  - Continue home rosuvastatin   Hypothyroidism - Continue home Levo     Daily Goals Checklist  Pain/Anxiety/Delirium protocol (if indicated): Tramadol added for PRN pain  VAP protocol (if indicated): N/A Respiratory support goals: discussed Blood pressure target: MAP > 70, discussed above DVT prophylaxis: maintain heparin dose Nutritional status and feeding goals: appetite discussed with patient  GI prophylaxis: continue pantoprazole Fluid status goals: continue CRRT  Urinary catheter: Assessment of intravascular volume Central lines: in place, no signs of infection Glucose control: continue SSI Mobility/therapy needs: continue as tolerated Antibiotic de-escalation: N/A Home medication reconciliation: To be optimized Daily labs: continue CBC, CMP Code Status: Full Family Communication: Discussed with family at bedside Disposition: TBD  Critical care time: 32 minutes      Domenick Bookbinder, MS4    Kipp Brood, MD [facility] ICU Physician [place]  Pager: [phone] Or Epic Secure Chat After hours: [phone].  01/22/2021, 8:03 AM     .

## 2021-01-23 ENCOUNTER — Inpatient Hospital Stay (HOSPITAL_COMMUNITY): Payer: Medicare Other

## 2021-01-23 ENCOUNTER — Encounter (HOSPITAL_COMMUNITY): Payer: Self-pay | Admitting: Internal Medicine

## 2021-01-23 ENCOUNTER — Ambulatory Visit: Payer: Medicare Other | Admitting: Cardiology

## 2021-01-23 DIAGNOSIS — N1832 Chronic kidney disease, stage 3b: Secondary | ICD-10-CM | POA: Diagnosis not present

## 2021-01-23 DIAGNOSIS — I5023 Acute on chronic systolic (congestive) heart failure: Secondary | ICD-10-CM | POA: Diagnosis not present

## 2021-01-23 DIAGNOSIS — N179 Acute kidney failure, unspecified: Secondary | ICD-10-CM | POA: Diagnosis not present

## 2021-01-23 DIAGNOSIS — I2510 Atherosclerotic heart disease of native coronary artery without angina pectoris: Secondary | ICD-10-CM | POA: Diagnosis not present

## 2021-01-23 LAB — RENAL FUNCTION PANEL
Albumin: 3.4 g/dL — ABNORMAL LOW (ref 3.5–5.0)
Albumin: 3.4 g/dL — ABNORMAL LOW (ref 3.5–5.0)
Anion gap: 7 (ref 5–15)
Anion gap: 8 (ref 5–15)
BUN: 12 mg/dL (ref 8–23)
BUN: 13 mg/dL (ref 8–23)
CO2: 27 mmol/L (ref 22–32)
CO2: 27 mmol/L (ref 22–32)
Calcium: 8.6 mg/dL — ABNORMAL LOW (ref 8.9–10.3)
Calcium: 8.5 mg/dL — ABNORMAL LOW (ref 8.9–10.3)
Chloride: 99 mmol/L (ref 98–111)
Chloride: 100 mmol/L (ref 98–111)
Creatinine, Ser: 1.51 mg/dL — ABNORMAL HIGH (ref 0.44–1.00)
Creatinine, Ser: 1.51 mg/dL — ABNORMAL HIGH (ref 0.44–1.00)
GFR, Estimated: 34 mL/min — ABNORMAL LOW (ref >60)
GFR, Estimated: 34 mL/min — ABNORMAL LOW (ref >60)
Glucose, Bld: 177 mg/dL — ABNORMAL HIGH (ref 70–99)
Glucose, Bld: 182 mg/dL — ABNORMAL HIGH (ref 70–99)
Phosphorus: 2.6 mg/dL (ref 2.5–4.6)
Phosphorus: 2.7 mg/dL (ref 2.5–4.6)
Potassium: 4.6 mmol/L (ref 3.5–5.1)
Potassium: 4.7 mmol/L (ref 3.5–5.1)
Sodium: 133 mmol/L — ABNORMAL LOW (ref 135–145)
Sodium: 135 mmol/L (ref 135–145)

## 2021-01-23 LAB — GLUCOSE, CAPILLARY
Glucose-Capillary: 176 mg/dL — ABNORMAL HIGH (ref 70–99)
Glucose-Capillary: 159 mg/dL — ABNORMAL HIGH (ref 70–99)
Glucose-Capillary: 191 mg/dL — ABNORMAL HIGH (ref 70–99)
Glucose-Capillary: 180 mg/dL — ABNORMAL HIGH (ref 70–99)
Glucose-Capillary: 166 mg/dL — ABNORMAL HIGH (ref 70–99)

## 2021-01-23 LAB — CBC
HCT: 34.3 % — ABNORMAL LOW (ref 36.0–46.0)
Hemoglobin: 10.1 g/dL — ABNORMAL LOW (ref 12.0–15.0)
MCH: 28.7 pg (ref 26.0–34.0)
MCHC: 29.4 g/dL — ABNORMAL LOW (ref 30.0–36.0)
MCV: 97.4 fL (ref 80.0–100.0)
Platelets: 157 10*3/uL (ref 150–400)
RBC: 3.52 MIL/uL — ABNORMAL LOW (ref 3.87–5.11)
RDW: 16.9 % — ABNORMAL HIGH (ref 11.5–15.5)
WBC: 8 10*3/uL (ref 4.0–10.5)
nRBC: 1.5 % — ABNORMAL HIGH (ref 0.0–0.2)

## 2021-01-23 LAB — HEPARIN LEVEL (UNFRACTIONATED): Heparin Unfractionated: 0.56 IU/mL (ref 0.30–0.70)

## 2021-01-23 LAB — COOXEMETRY PANEL
Carboxyhemoglobin: 1.2 % (ref 0.5–1.5)
Methemoglobin: 0.8 % (ref 0.0–1.5)
O2 Saturation: 71.9 %
Total hemoglobin: 11 g/dL — ABNORMAL LOW (ref 12.0–16.0)

## 2021-01-23 LAB — APTT: aPTT: 65 seconds — ABNORMAL HIGH (ref 24–36)

## 2021-01-23 LAB — MAGNESIUM: Magnesium: 2.4 mg/dL (ref 1.7–2.4)

## 2021-01-23 MED ORDER — SODIUM CHLORIDE 0.9 % IV SOLN: INTRAVENOUS | Status: DC

## 2021-01-23 MED ORDER — SENNA 8.6 MG PO TABS
1.0000 | ORAL_TABLET | Freq: Once | ORAL | Status: AC
  Administered 2021-01-23: 8.6 mg via ORAL
  Filled 2021-01-23: qty 1

## 2021-01-23 MED ORDER — POLYETHYLENE GLYCOL 3350 17 G PO PACK
17.0000 g | PACK | Freq: Once | ORAL | Status: AC
  Administered 2021-01-23: 17 g via ORAL
  Filled 2021-01-23: qty 1

## 2021-01-23 MED ORDER — B COMPLEX-C PO TABS
1.0000 | ORAL_TABLET | Freq: Every day | ORAL | Status: DC
  Administered 2021-01-23 – 2021-01-25 (×3): 1 via ORAL
  Filled 2021-01-23 (×3): qty 1

## 2021-01-23 MED ORDER — GLYCERIN (LAXATIVE) 2 G RE SUPP
1.0000 | RECTAL | Status: DC | PRN
  Administered 2021-01-23: 1 via RECTAL
  Filled 2021-01-23 (×3): qty 1

## 2021-01-23 MED ORDER — ENSURE ENLIVE PO LIQD
237.0000 mL | Freq: Three times a day (TID) | ORAL | Status: DC
  Administered 2021-01-23 – 2021-01-25 (×4): 237 mL via ORAL

## 2021-01-23 NOTE — H&P (View-Only) (Signed)
Patient ID: Patricia [name], female   DOB: Apr 26, 1936, 85 y.o.   MRN: [id number] P    Advanced Heart Failure Rounding Note  PCP-Cardiologist: [name] Martinique, MD   Subjective:    CVVH 3/27, weight down another 3lbs.  Pulling UF 75cc/hr.  Co-ox 71%    Remains on amio 60 mg + Norepi 14 mcg. CO-OX 72%.   Complaining of nausea. Having a hard time eating.     Objective:   Weight Range: 93.8 kg Body mass index is 37.82 kg/m.   Vital Signs:   Temp:  [97.5 F (36.4 C)-98.5 F (36.9 C)] 97.6 F (36.4 C) (03/30 0000) Pulse Rate:  [65-123] 100 (03/30 0730) Resp:  [13-33] 15 (03/30 0730) BP: (65-142)/(34-113) 112/59 (03/30 0730) SpO2:  [79 %-100 %] 99 % (03/30 0730) Weight:  [93.8 kg] 93.8 kg (03/30 0500) Last BM Date: 01/11/2021  Weight change: Filed Weights   01/21/21 0530 01/22/21 0500 01/23/21 0500  Weight: 97 kg 95.7 kg 93.8 kg    Intake/Output:   Intake/Output Summary (Last 24 hours) at 01/23/2021 0756 Last data filed at 01/23/2021 0700 Gross per 24 hour  Intake 1731.14 ml  Output 3665 ml  Net -1933.86 ml      Physical Exam   General:  Appears tired.  No resp difficulty HEENT: normal Neck: supple. JVP 11-12. Carotids 2+ bilat; no bruits. No lymphadenopathy or thryomegaly appreciated. Cor: PMI nondisplaced. Irregular rate & rhythm. No rubs, gallops or murmurs. Lungs: clear Abdomen: soft, nontender, nondistended. No hepatosplenomegaly. No bruits or masses. Good bowel sounds. Extremities: no cyanosis, clubbing, rash, edema Neuro: alert & orientedx3, cranial nerves grossly intact. moves all 4 extremities w/o difficulty. Affect flat.    Telemetry    A fib 100-110s   Labs    CBC Recent Labs    01/22/21 0448 01/23/21 0425  WBC 8.1 8.0  HGB 9.8* 10.1*  HCT 31.9* 34.3*  MCV 95.2 97.4  PLT 162 573   Basic Metabolic Panel Recent Labs    01/22/21 0448 01/22/21 1600 01/23/21 0425  NA 134* 132* 133*  K 4.4 4.6 4.6  CL 100 97* 99  CO2 _0 GLUCOSE  205* 216* 177*  BUN _1 CREATININE 1.61* 1.54* 1.51*  CALCIUM 8.3* 8.3* 8.6*  MG 2.4  --  2.4  PHOS 2.6 2.6 2.6   Liver Function Tests Recent Labs    01/22/21 1600 01/23/21 0425  ALBUMIN 3.4* 3.4*   No results for input(s): LIPASE, AMYLASE in the last 72 hours. Cardiac Enzymes No results for input(s): CKTOTAL, CKMB, CKMBINDEX, TROPONINI in the last 72 hours.  BNP: BNP (last 3 results) Recent Labs    12/17/20 0022 01/04/2021 1904 01/22/21 1106  BNP 574.3* 1,681.6* 1,143.9*    ProBNP (last 3 results) No results for input(s): PROBNP in the last 8760 hours.   D-Dimer No results for input(s): DDIMER in the last 72 hours. Hemoglobin A1C No results for input(s): HGBA1C in the last 72 hours. Fasting Lipid Panel No results for input(s): CHOL, HDL, LDLCALC, TRIG, CHOLHDL, LDLDIRECT in the last 72 hours. Thyroid Function Tests No results for input(s): TSH, T4TOTAL, T3FREE, THYROIDAB in the last 72 hours.  Invalid input(s): FREET3  Other results:   Imaging    DG Chest 1 View  Result Date: 01/23/2021 CLINICAL DATA:  Follow-up pulmonary edema EXAM: CHEST  1 VIEW COMPARISON:  01/22/2021 FINDINGS: Cardiac shadow remains enlarged. Changes of prior TAVR are seen. Dialysis catheter is again noted from  the left jugular approach. Small bilateral pleural effusions are noted right greater than left stable from the prior exam. Persistent vascular congestion and mild edema are noted and stable. No bony abnormality is seen. IMPRESSION: Stable appearance of the chest when compared with the prior exam. Electronically Signed   By: Inez Catalina M.D.   On: 01/23/2021 04:02   DG CHEST PORT 1 VIEW  Result Date: 01/22/2021 CLINICAL DATA:  Shortness of breath EXAM: PORTABLE CHEST 1 VIEW COMPARISON:  January 20, 2021 FINDINGS: Central catheter tip is at the junction of the left innominate vein and superior vena cava. No pneumothorax. There are small pleural effusions bilaterally. There is  cardiomegaly with pulmonary venous hypertension. There is slight interstitial edema. There is atelectatic change in the right base with suspected associated consolidation in the right base. Patient is status post aortic valve replacement. There is aortic atherosclerosis. No adenopathy. There is a total shoulder replacement on the right. IMPRESSION: Central catheter as described without pneumothorax. There is cardiomegaly with pulmonary vascular congestion. Pleural effusions bilaterally with mild interstitial edema. Suspect a degree of underlying congestive heart failure. Opacity right base may represent atelectasis with a degree of superimposed developing pneumonia. Aortic Atherosclerosis (ICD10-I70.0). Electronically Signed   By: Lowella Grip III M.D.   On: 01/22/2021 12:09     Medications:     Scheduled Medications: . Chlorhexidine Gluconate Cloth  6 each Topical Daily  . fluticasone furoate-vilanterol  1 puff Inhalation Daily   And  . umeclidinium bromide  1 puff Inhalation Daily  . insulin aspart  0-5 Units Subcutaneous QHS  . insulin aspart  0-9 Units Subcutaneous TID WC  . levothyroxine  88 mcg Oral Q0600  . midodrine  15 mg Oral TID WC  . pantoprazole  40 mg Oral BID  . QUEtiapine  12.5 mg Oral QHS  . rosuvastatin  10 mg Oral Daily  . sodium chloride flush  10-40 mL Intracatheter Q12H  . sodium chloride flush  3 mL Intravenous Q12H  . sodium chloride flush  3 mL Intravenous Q12H    Infusions: .  prismasol BGK 4/2.5 500 mL/hr at 01/22/21 1640  .  prismasol BGK 4/2.5 300 mL/hr at 01/22/21 1757  . sodium chloride    . amiodarone 60 mg/hr (01/23/21 0700)  . heparin 1,300 Units/hr (01/23/21 0700)  . norepinephrine (LEVOPHED) Adult infusion 14 mcg/min (01/23/21 0700)  . prismasol BGK 4/2.5 1,500 mL/hr at 01/23/21 0533    PRN Medications: sodium chloride, acetaminophen **OR** acetaminophen, albuterol, ALPRAZolam, azelastine, dextromethorphan-guaiFENesin, docusate sodium,  fentaNYL (SUBLIMAZE) injection, heparin, methocarbamol, Muscle Rub, ondansetron **OR** ondansetron (ZOFRAN) IV, polyethylene glycol, sodium chloride flush, sodium chloride flush, traMADol  Assessment/Plan   1. Acute on chronic systolic CHF: Cause of cardiomyopathy uncertain.  Patient has history of 1-vessel CAD with 75% OM3 stenosis on 11/17 cath.  Cannot rule out tachy-mediated CMP.  Echo this admission with EF 25-30%, severe LVH, moderately decreased RV systolic function, mild-moderate MR, stable TAVR valve with mean gradient 12 mmHg, IVC dilated. RHC with elevated RA pressure and PA pressure, unable to get PCWP, CI 2.2.   Diuresis has been attempted but progressed poorly on Lasix gtt 20 mg/hr + metolazone 5 bid with rising creatinine.  CVVH started 3/27.  Currently, UF 75 cc/hr net.CO-OX 72%.  - Continue NE to maintain MAP with CVVH.  - Will aim for TEE-guided DCCV tomorrow.  - Has wide LBBB, may be CRT candidate down the road but would need significant stabilization.  2. CAD:  LHC in 11/17 with 75% OM3.  Not candidate for coronary angiography at this point with AKI. No chest pain.   - Continue statin.  3. Atrial fibrillation: History of PAF, admitted here with AF/RVR, HR in 110s generally.  This likely contributes to CHF exacerbation. Has not been on anticoagulation due to history of recurrent GI bleeding.  - Continue amiodarone 60 mg/hr.  - Now on heparin gtt.   -Set up TEE/DC-CV tomorrow.   Suspect she will be able to tolerate apixaban for at least a month or so post-DCCV.  4. AKI on CKD stage IIIb: Creatinine up to 3.58 with poor diuresis.  Suspect cardiorenal syndrome.  Poor response to high dose diuretics.  Suspect she is a  poor candidate for long-term HD with advanced HF, but reasonable to try short-term CVVH to see if she can stabilize with volume removal acutely.  - Nephrology following, CVVH as above. 5. H/o TAVR: Bioprosthetic AoV stable on 3/22 echo.  6. COPD, OHS: On home oxygen at  baseline.  7. Deconditioning -PT/OT following.   TEE/DC-CV tomorrow. Can hold CVVHD for procedure.   Palliative care following.  Long-term HD would be difficult.   Length of Stay: 7  Patricia Clegg, NP  01/23/2021, 7:56 AM  Advanced Heart Failure Team Pager [phone] (M-F; 7a - 5p)  Please contact [facility] for night-coverage after hours (5p -7a ) and weekends on [email or web address]  Patient seen with NP, agree with the above note.   Patient has been struggling with CVVH.  Currently, UF about 100 cc/hr net.  She has had significant nausea, feels "bad."  Currently off milrinone and on NE 14 + amiodarone 60 mg/hr with HR in 90s.     CVP 13 this morning. Weight down another 3 lbs.   General: Drowsy but awakens.  Neck: JVP 12 cm, no thyromegaly or thyroid nodule.  Lungs: Clear to auscultation bilaterally with normal respiratory effort. CV: Nondisplaced PMI.  Heart irregular S1/S2, no S3/S4, no murmur. 1+ ankle edema.  Abdomen: Soft, nontender, no hepatosplenomegaly, no distention.  Skin: Intact without lesions or rashes.  Neurologic: Alert and oriented x 3.  Psych: Normal affect. Extremities: No clubbing or cyanosis.  HEENT: Normal.   Continue CVVH today with gentle UF 75-100 cc/hr net negative as CVP still up.  Continue midodrine 15 mg tid + NE to maintain MAP with CVVH.  Titrate down NE as able.   Continue heparin gtt with amiodarone gtt for now, decrease amiodarone to 30 mg/hr.  Once she has more volume off (tomorrow), will plan TEE-guided DCCV.  Hgb ok at 10.1, no overt bleeding.   She is having difficulty with CVVH, requiring NE to maintain MAP.  If she is not able to recover and come off RRT with fluid removal and DCCV, I think that she would have a very hard time with iHD due to advanced HF.  Palliative care has seen, will need ongoing discussions.   CRITICAL CARE Performed by: [name]  Total critical care time: 35 minutes  Critical care time was exclusive of  separately billable procedures and treating other patients.  Critical care was necessary to treat or prevent imminent or life-threatening deterioration.  Critical care was time spent personally by me on the following activities: development of treatment plan with patient and/or surrogate as well as nursing, discussions with consultants, evaluation of patient's response to treatment, examination of patient, obtaining history from patient or surrogate, ordering and performing treatments and interventions, ordering and review of laboratory  studies, ordering and review of radiographic studies, pulse oximetry and re-evaluation of patient's condition.  [name] 01/23/2021 8:31 AM

## 2021-01-23 NOTE — Progress Notes (Addendum)
Patient ID: Patricia [name], female   DOB: 10/09/1936, 85 y.o.   MRN: [id number] P    Advanced Heart Failure Rounding Note  PCP-Cardiologist: [name] Jordan, MD   Subjective:    CVVH 3/27, weight down another 3lbs.  Pulling UF 75cc/hr.  Co-ox 71%    Remains on amio 60 mg + Norepi 14 mcg. CO-OX 72%.   Complaining of nausea. Having a hard time eating.     Objective:   Weight Range: 93.8 kg Body mass index is 37.82 kg/m.   Vital Signs:   Temp:  [97.5 F (36.4 C)-98.5 F (36.9 C)] 97.6 F (36.4 C) (03/30 0000) Pulse Rate:  [65-123] 100 (03/30 0730) Resp:  [13-33] 15 (03/30 0730) BP: (65-142)/(34-113) 112/59 (03/30 0730) SpO2:  [79 %-100 %] 99 % (03/30 0730) Weight:  [93.8 kg] 93.8 kg (03/30 0500) Last BM Date: 01/06/2021  Weight change: Filed Weights   01/21/21 0530 01/22/21 0500 01/23/21 0500  Weight: 97 kg 95.7 kg 93.8 kg    Intake/Output:   Intake/Output Summary (Last 24 hours) at 01/23/2021 0756 Last data filed at 01/23/2021 0700 Gross per 24 hour  Intake 1731.14 ml  Output 3665 ml  Net -1933.86 ml      Physical Exam   General:  Appears tired.  No resp difficulty HEENT: normal Neck: supple. JVP 11-12. Carotids 2+ bilat; no bruits. No lymphadenopathy or thryomegaly appreciated. Cor: PMI nondisplaced. Irregular rate & rhythm. No rubs, gallops or murmurs. Lungs: clear Abdomen: soft, nontender, nondistended. No hepatosplenomegaly. No bruits or masses. Good bowel sounds. Extremities: no cyanosis, clubbing, rash, edema Neuro: alert & orientedx3, cranial nerves grossly intact. moves all 4 extremities w/o difficulty. Affect flat.    Telemetry    A fib 100-110s   Labs    CBC Recent Labs    01/22/21 0448 01/23/21 0425  WBC 8.1 8.0  HGB 9.8* 10.1*  HCT 31.9* 34.3*  MCV 95.2 97.4  PLT 162 157   Basic Metabolic Panel Recent Labs    01/22/21 0448 01/22/21 1600 01/23/21 0425  NA 134* 132* 133*  K 4.4 4.6 4.6  CL 100 97* 99  CO2 29 28 27  GLUCOSE  205* 216* 177*  BUN 19 15 12  CREATININE 1.61* 1.54* 1.51*  CALCIUM 8.3* 8.3* 8.6*  MG 2.4  --  2.4  PHOS 2.6 2.6 2.6   Liver Function Tests Recent Labs    01/22/21 1600 01/23/21 0425  ALBUMIN 3.4* 3.4*   No results for input(s): LIPASE, AMYLASE in the last 72 hours. Cardiac Enzymes No results for input(s): CKTOTAL, CKMB, CKMBINDEX, TROPONINI in the last 72 hours.  BNP: BNP (last 3 results) Recent Labs    12/17/20 0022 01/19/2021 1904 01/22/21 1106  BNP 574.3* 1,681.6* 1,143.9*    ProBNP (last 3 results) No results for input(s): PROBNP in the last 8760 hours.   D-Dimer No results for input(s): DDIMER in the last 72 hours. Hemoglobin A1C No results for input(s): HGBA1C in the last 72 hours. Fasting Lipid Panel No results for input(s): CHOL, HDL, LDLCALC, TRIG, CHOLHDL, LDLDIRECT in the last 72 hours. Thyroid Function Tests No results for input(s): TSH, T4TOTAL, T3FREE, THYROIDAB in the last 72 hours.  Invalid input(s): FREET3  Other results:   Imaging    DG Chest 1 View  Result Date: 01/23/2021 CLINICAL DATA:  Follow-up pulmonary edema EXAM: CHEST  1 VIEW COMPARISON:  01/22/2021 FINDINGS: Cardiac shadow remains enlarged. Changes of prior TAVR are seen. Dialysis catheter is again noted from   the left jugular approach. Small bilateral pleural effusions are noted right greater than left stable from the prior exam. Persistent vascular congestion and mild edema are noted and stable. No bony abnormality is seen. IMPRESSION: Stable appearance of the chest when compared with the prior exam. Electronically Signed   By: Mark  Lukens M.D.   On: 01/23/2021 04:02   DG CHEST PORT 1 VIEW  Result Date: 01/22/2021 CLINICAL DATA:  Shortness of breath EXAM: PORTABLE CHEST 1 VIEW COMPARISON:  January 20, 2021 FINDINGS: Central catheter tip is at the junction of the left innominate vein and superior vena cava. No pneumothorax. There are small pleural effusions bilaterally. There is  cardiomegaly with pulmonary venous hypertension. There is slight interstitial edema. There is atelectatic change in the right base with suspected associated consolidation in the right base. Patient is status post aortic valve replacement. There is aortic atherosclerosis. No adenopathy. There is a total shoulder replacement on the right. IMPRESSION: Central catheter as described without pneumothorax. There is cardiomegaly with pulmonary vascular congestion. Pleural effusions bilaterally with mild interstitial edema. Suspect a degree of underlying congestive heart failure. Opacity right base may represent atelectasis with a degree of superimposed developing pneumonia. Aortic Atherosclerosis (ICD10-I70.0). Electronically Signed   By: William  Woodruff III M.D.   On: 01/22/2021 12:09     Medications:     Scheduled Medications: . Chlorhexidine Gluconate Cloth  6 each Topical Daily  . fluticasone furoate-vilanterol  1 puff Inhalation Daily   And  . umeclidinium bromide  1 puff Inhalation Daily  . insulin aspart  0-5 Units Subcutaneous QHS  . insulin aspart  0-9 Units Subcutaneous TID WC  . levothyroxine  88 mcg Oral Q0600  . midodrine  15 mg Oral TID WC  . pantoprazole  40 mg Oral BID  . QUEtiapine  12.5 mg Oral QHS  . rosuvastatin  10 mg Oral Daily  . sodium chloride flush  10-40 mL Intracatheter Q12H  . sodium chloride flush  3 mL Intravenous Q12H  . sodium chloride flush  3 mL Intravenous Q12H    Infusions: .  prismasol BGK 4/2.5 500 mL/hr at 01/22/21 1640  .  prismasol BGK 4/2.5 300 mL/hr at 01/22/21 1757  . sodium chloride    . amiodarone 60 mg/hr (01/23/21 0700)  . heparin 1,300 Units/hr (01/23/21 0700)  . norepinephrine (LEVOPHED) Adult infusion 14 mcg/min (01/23/21 0700)  . prismasol BGK 4/2.5 1,500 mL/hr at 01/23/21 0533    PRN Medications: sodium chloride, acetaminophen **OR** acetaminophen, albuterol, ALPRAZolam, azelastine, dextromethorphan-guaiFENesin, docusate sodium,  fentaNYL (SUBLIMAZE) injection, heparin, methocarbamol, Muscle Rub, ondansetron **OR** ondansetron (ZOFRAN) IV, polyethylene glycol, sodium chloride flush, sodium chloride flush, traMADol  Assessment/Plan   1. Acute on chronic systolic CHF: Cause of cardiomyopathy uncertain.  Patient has history of 1-vessel CAD with 75% OM3 stenosis on 11/17 cath.  Cannot rule out tachy-mediated CMP.  Echo this admission with EF 25-30%, severe LVH, moderately decreased RV systolic function, mild-moderate MR, stable TAVR valve with mean gradient 12 mmHg, IVC dilated. RHC with elevated RA pressure and PA pressure, unable to get PCWP, CI 2.2.   Diuresis has been attempted but progressed poorly on Lasix gtt 20 mg/hr + metolazone 5 bid with rising creatinine.  CVVH started 3/27.  Currently, UF 75 cc/hr net.CO-OX 72%.  - Continue NE to maintain MAP with CVVH.  - Will aim for TEE-guided DCCV tomorrow.  - Has wide LBBB, may be CRT candidate down the road but would need significant stabilization.  2. CAD:   LHC in 11/17 with 75% OM3.  Not candidate for coronary angiography at this point with AKI. No chest pain.   - Continue statin.  3. Atrial fibrillation: History of PAF, admitted here with AF/RVR, HR in 110s generally.  This likely contributes to CHF exacerbation. Has not been on anticoagulation due to history of recurrent GI bleeding.  - Continue amiodarone 60 mg/hr.  - Now on heparin gtt.   -Set up TEE/DC-CV tomorrow.   Suspect she will be able to tolerate apixaban for at least a month or so post-DCCV.  4. AKI on CKD stage IIIb: Creatinine up to 3.58 with poor diuresis.  Suspect cardiorenal syndrome.  Poor response to high dose diuretics.  Suspect she is a  poor candidate for long-term HD with advanced HF, but reasonable to try short-term CVVH to see if she can stabilize with volume removal acutely.  - Nephrology following, CVVH as above. 5. H/o TAVR: Bioprosthetic AoV stable on 3/22 echo.  6. COPD, OHS: On home oxygen at  baseline.  7. Deconditioning -PT/OT following.   TEE/DC-CV tomorrow. Can hold CVVHD for procedure.   Palliative care following.  Long-term HD would be difficult.   Length of Stay: 7  [name] Clegg, NP  01/23/2021, 7:56 AM  Advanced Heart Failure Team Pager [phone] (M-F; 7a - 5p)  Please contact [facility] for night-coverage after hours (5p -7a ) and weekends on [email or web address]  Patient seen with NP, agree with the above note.   Patient has been struggling with CVVH.  Currently, UF about 100 cc/hr net.  She has had significant nausea, feels "bad."  Currently off milrinone and on NE 14 + amiodarone 60 mg/hr with HR in 90s.     CVP 13 this morning. Weight down another 3 lbs.   General: Drowsy but awakens.  Neck: JVP 12 cm, no thyromegaly or thyroid nodule.  Lungs: Clear to auscultation bilaterally with normal respiratory effort. CV: Nondisplaced PMI.  Heart irregular S1/S2, no S3/S4, no murmur. 1+ ankle edema.  Abdomen: Soft, nontender, no hepatosplenomegaly, no distention.  Skin: Intact without lesions or rashes.  Neurologic: Alert and oriented x 3.  Psych: Normal affect. Extremities: No clubbing or cyanosis.  HEENT: Normal.   Continue CVVH today with gentle UF 75-100 cc/hr net negative as CVP still up.  Continue midodrine 15 mg tid + NE to maintain MAP with CVVH.  Titrate down NE as able.   Continue heparin gtt with amiodarone gtt for now, decrease amiodarone to 30 mg/hr.  Once she has more volume off (tomorrow), will plan TEE-guided DCCV.  Hgb ok at 10.1, no overt bleeding.   She is having difficulty with CVVH, requiring NE to maintain MAP.  If she is not able to recover and come off RRT with fluid removal and DCCV, I think that she would have a very hard time with iHD due to advanced HF.  Palliative care has seen, will need ongoing discussions.   CRITICAL CARE Performed by: Zyquan Crotty  Total critical care time: 35 minutes  Critical care time was exclusive of  separately billable procedures and treating other patients.  Critical care was necessary to treat or prevent imminent or life-threatening deterioration.  Critical care was time spent personally by me on the following activities: development of treatment plan with patient and/or surrogate as well as nursing, discussions with consultants, evaluation of patient's response to treatment, examination of patient, obtaining history from patient or surrogate, ordering and performing treatments and interventions, ordering and review of laboratory   studies, ordering and review of radiographic studies, pulse oximetry and re-evaluation of patient's condition.  [name] 01/23/2021 8:31 AM

## 2021-01-23 NOTE — Progress Notes (Signed)
Initial Nutrition Assessment  DOCUMENTATION CODES:   Not applicable  INTERVENTION:   Liberalize diet to REGULAR   Ensure Enlive po TID, each supplement provides 350 kcal and 20 grams of protein  B complex with vitamin C to account for losses with CRRT  No BM x 5 days, recommend schedule bowel regimen  NUTRITION DIAGNOSIS:   Increased nutrient needs related to acute illness as evidenced by estimated needs.  GOAL:   Patient will meet greater than or equal to 90% of their needs  MONITOR:   PO intake,Supplement acceptance,Weight trends,Labs,I & O's,Skin  REASON FOR ASSESSMENT:   Rounds    ASSESSMENT:   Patient with PMH significant for COPD, CHF, CKD IV, GERD, s/p TAVR, DM, and chronic GI bleed. Presents this admission with AKI on CKD IV.   3/26- start CRRT  Continues on levophed. CRRT kept even. DCCV planned for tomorrow. No BM in 5 days, Abdomen xray suggests enteritis or early ileus. Had small vomiting episode upon RD assessment. Patient reports nausea is constant. May benefit from schedule zofran vs PRN. Intake decreased over the last 3 days. RD to provide supplements to maximize kcal and protein. Recommend Cortrak if intake does not progress given elevated needs (if within [place]).   Patient had great appetite PTA. Eat three meals daily. Denies weight loss from UBW of 230 lb.   Admission weight: 102.9 kg    Current weight: 93.8 kg   UOP: anuric CRRT: 3665 ml x 24 hrs   Drips: levophed Medications: SS novolog Labs: Na 133 (L) CBG 159-210   NUTRITION - FOCUSED PHYSICAL EXAM:  Flowsheet Row Most Recent Value  Orbital Region No depletion  Upper Arm Region No depletion  Thoracic and Lumbar Region Unable to assess  Buccal Region No depletion  Temple Region Mild depletion  Clavicle Bone Region No depletion  Clavicle and Acromion Bone Region No depletion  Scapular Bone Region Unable to assess  Dorsal Hand No depletion  Patellar Region No depletion  Anterior  Thigh Region No depletion  Posterior Calf Region No depletion  Edema (RD Assessment) Mild  Hair Reviewed  Eyes Unable to assess  Mouth Unable to assess  Skin Reviewed  Nails Reviewed       Diet Order:   Diet Order            Diet regular Room service appropriate? Yes; Fluid consistency: Thin  Diet effective now                 EDUCATION NEEDS:   Not appropriate for education at this time  Skin:  Skin Assessment: Skin Integrity Issues: Skin Integrity Issues:: Stage I Stage I: coccyx, buttocks  Last BM:  3/25  Height:   Ht Readings from Last 1 Encounters:  01/16/21 5' 2" (1.575 m)    Weight:   Wt Readings from Last 1 Encounters:  01/23/21 93.8 kg    BMI:  Body mass index is 37.82 kg/m.  Estimated Nutritional Needs:   Kcal:  1800-2000 kcal  Protein:  95-115 grams  Fluid:  >/= 1.8 L/day  Mariana Single RD, LDN Clinical Nutrition Pager listed in [place]

## 2021-01-23 NOTE — Progress Notes (Signed)
Inpatient Diabetes Program Recommendations  AACE/ADA: New Consensus Statement on Inpatient Glycemic Control   Target Ranges:  Prepandial:   less than 140 mg/dL      Peak postprandial:   less than 180 mg/dL (1-2 hours)      Critically ill patients:  140 - 180 mg/dL   Results for Patricia [name], Patricia [name] (MRN [id number]) as of 01/23/2021 10:53  Ref. Range 01/22/2021 06:54 01/22/2021 11:18 01/22/2021 15:32 01/22/2021 21:49 01/23/2021 06:58  Glucose-Capillary Latest Ref Range: 70 - 99 mg/dL 183 (H) 199 (H) 210 (H) 176 (H) 159 (H)   Review of Glycemic Control  Diabetes history: DM2 Outpatient Diabetes medications: Amaryl 1 mg QAM, Januvia 50 mg daily Current orders for Inpatient glycemic control: Novolog 0-9 units TID with meals, Novolog 0-5 units QHS  Inpatient Diabetes Program Recommendations:    Insulin: Noted patient not eating well.  Please consider increasing Novolog correction to 0-15 units TID with meals.  Once patient is eating well, may want to consider ordering Novolog 3 units TID with meals for meal coverage if patient eats at least 50% of meals.  Thanks, Barnie Alderman, RN, MSN, CDE Diabetes Coordinator Inpatient Diabetes Program [phone] (Team Pager from 8am to 5pm)

## 2021-01-23 NOTE — Progress Notes (Signed)
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   On admission, patient described increased SOB on 2L O2 at home, bilateral lower extremity pitting edema, and fluctuating/elevated HR. Describes process as "long time in making". BNP 1681, CXR with bilateral pleural effusions and pulmonary edema. Admitted to [facility] for acute heart failure exacerbation. Troponin trend at that time not c/w ACS. Got 5m lasix in ED, and total of 1449mLasix 3/23, lasix w/ metolazone, without a robust response. ECHO obtained 3/23 which revealed LVEF 25-30%, LVH, reduced RV systolic function but normal RV size. Severely dilated LA. Moderately dilated RA.     3/24 Cr has bumped and lasix being held. Cardiology suggests PCCM consult to evaluate SOB for pulmonary etiology of SOB. 3/26: Transfer to ICU for initiation of CRRT 3/27: CRRT w/ CVHHD 3/28: continue CVVHD for kidney recover and euvolemia  3/29: weight 98.5 >> 95.7 kg, increased inotropic support required ON   3/30: weight 98.5 >> 93.8 kg, appears euvolemic, increased inotropic support required ON     Past Medical History   Past Medical History:  Diagnosis Date  . Allergy   . Anemia, unspecified   . Arthritis   . Asthma   . [facility]   s/p TEE/DCCV>>SR on amio  . Benign neoplasm of colon   . Carpal tunnel syndrome   . Cellulitis and abscess of finger, unspecified   . Cerumen impaction   . Cervicalgia   . CHF (congestive heart failure) ([place]  . Chronic airway obstruction, not elsewhere classified   . Chronic  anticoagulation 09/2015   Eliquis  . Diarrhea   . Diffuse cystic mastopathy   . Edema   . Encounter for long-term (current) use of other medications   . GERD (gastroesophageal reflux disease)   . Heart murmur   . Lumbago   . Neck pain 05/23/2015  . Obesity, unspecified   . Other and unspecified hyperlipidemia   . Other psoriasis   . Pain in joint, lower leg   . PONV (postoperative nausea and vomiting)   . Primary localized osteoarthrosis of right shoulder 12/18/2015  . 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 Scoliosis (and kyphoscoliosis), idiopathic   . Shortness of breath dyspnea    with exertion  . Type II or unspecified type diabetes mellitus without mention of complication, uncontrolled   . Unspecified essential hypertension   . Unspecified hemorrhoids without mention of complication   . Unspecified hypothyroidism   . Urge incontinence      Past Surgical History:  Procedure Laterality Date  . ABDOMINAL HYSTERECTOMY  1987   complete  . BREAST BIOPSY Right   . BREAST EXCISIONAL BIOPSY Left 2011  . BREAST EXCISIONAL BIOPSY Right    x2  . BREAST SURGERY     right breast 3 surgeries [phone]. CARDIAC CATHETERIZATION N/A 09/02/2016   Procedure: Right/Left Heart Cath and Coronary Angiography;  Surgeon: [name] M JoMartiniqueMD;  Location: [place] CV LAB;  Service: Cardiovascular;  Laterality: N/A;  . CARDIOVERSION N/A 10/19/2015   Procedure: CARDIOVERSION;  Surgeon: Lelon Perla, MD;  Location: [facility] ENDOSCOPY;  Service: Cardiovascular;  Laterality: N/A;  . CARDIOVERSION N/A 11/13/2017   Procedure: CARDIOVERSION;  Surgeon: Jerline Pain, MD;  Location: [facility] ENDOSCOPY;  Service: Cardiovascular;  Laterality: N/A;  . CARDIOVERSION N/A 07/28/2019   Procedure: CARDIOVERSION;  Surgeon: Buford Dresser, MD;  Location: [place];  Service:  Cardiovascular;  Laterality: N/A;  . [place]  . COLON SURGERY     2004,2007,2013.  3 times colon surgieres  . ESOPHAGOGASTRODUODENOSCOPY (EGD) WITH PROPOFOL Left 03/13/2017   Procedure: ESOPHAGOGASTRODUODENOSCOPY (EGD) WITH PROPOFOL;  Surgeon: Ronnette Juniper, MD;  Location: [place];  Service: Gastroenterology;  Laterality: Left;  . ESOPHAGOGASTRODUODENOSCOPY (EGD) WITH PROPOFOL Left 01/22/2018   Procedure: ESOPHAGOGASTRODUODENOSCOPY (EGD) WITH PROPOFOL;  Surgeon: Arta Silence, MD;  Location: [facility] ENDOSCOPY;  Service: Endoscopy;  Laterality: Left;  . EXCISE LE MANDIBULAR LYMPH NODE T     DR C. NEWMAN  . FOOT SURGERY  1989  . GIVENS CAPSULE STUDY Left 01/23/2018   Procedure: GIVENS CAPSULE STUDY;  Surgeon: Wilford Corner, MD;  Location: [facility] ENDOSCOPY;  Service: Endoscopy;  Laterality: Left;  . LEFT SHOULDER ARTHROSCOPY    . 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 NEUROPLASTY / TRANSPOSITION MEDIAN NERVE AT CARPAL TUNNEL BILATERAL  05/2003  . RIGHT HEART CATH N/A 01/17/2021   Procedure: RIGHT HEART CATH;  Surgeon: Leonie Man, MD;  Location: [place] CV LAB;  Service: Cardiovascular;  Laterality: N/A;  . TEE WITHOUT CARDIOVERSION N/A 10/19/2015   Procedure: Transesophageal Echocardiogram (TEE) ;  Surgeon: Lelon Perla, MD;  Location: [facility] ENDOSCOPY;  Service: Cardiovascular;  Laterality: N/A;  . TEE WITHOUT CARDIOVERSION N/A 10/14/2016   Procedure: TRANSESOPHAGEAL ECHOCARDIOGRAM (TEE);  Surgeon: Sherren Mocha, MD;  Location: [place];  Service: Open Heart Surgery;  Laterality: N/A;  . TEE WITHOUT CARDIOVERSION N/A 07/28/2019   Procedure: TRANSESOPHAGEAL ECHOCARDIOGRAM (TEE);  Surgeon: Buford Dresser, MD;  Location: [facility] ENDOSCOPY;  Service: Cardiovascular;  Laterality: N/A;  . TOTAL SHOULDER ARTHROPLASTY Right 12/18/2015   Procedure: TOTAL SHOULDER ARTHROPLASTY;  Surgeon: [name], MD;  Location: [place];  Service: Orthopedics;  Laterality: Right;  .  TRANSCATHETER AORTIC VALVE REPLACEMENT, TRANSFEMORAL N/A 10/14/2016   Procedure: TRANSCATHETER AORTIC VALVE REPLACEMENT, TRANSFEMORAL;  Surgeon: Sherren Mocha, MD;  Location: [place];  Service: Open Heart Surgery;  Laterality: N/A;  . TRIGGER FINGER RELEASE Left   . VAGINAL CYST REMOVED  1967     Review of Systems:   ROS  Social History   reports that she quit smoking about 31 years ago. She has a 40.00 pack-year smoking history. She has never used smokeless tobacco. She reports that she does not drink alcohol and does not use drugs.   Family History   Her family history includes Arthritis in her sister; Asthma in her sister; Diabetes in her father; Heart disease in her brother and father; Liver cancer in her sister; Stroke in her father. There is no history of Breast cancer.   Allergies Allergies  Allergen Reactions  . Codeine Other (See Comments)    "Spaces out" the patient and she cannot walk well  . Vesicare [Solifenacin] Itching     Home Medications  Prior to Admission medications   Medication Sig Start Date End Date Taking? Authorizing Provider  acetaminophen (TYLENOL) 650 MG CR tablet Take 1,300 mg by mouth every  8 (eight) hours as needed for pain.   Yes [provider]  albuterol (PROVENTIL) (2.5 MG/3ML) 0.083% nebulizer solution USE 1 VIAL IN NEBULIZER EVERY 4 HOURS AND AS NEEDED. Generic: VENTOLIN Patient taking differently: Take 2.5 mg by nebulization every 4 (four) hours as needed for shortness of breath or wheezing. 07/12/20  Yes Brand Males, MD  albuterol (VENTOLIN HFA) 108 (90 Base) MCG/ACT inhaler INHALE 2 PUFFS INTO THE LUNGS EVERY 6 HOURS AS NEEDED FOR WHEEZING OR SHORTNESS OF BREATH Patient taking differently: Inhale 2 puffs into the lungs every 6 (six) hours as needed for shortness of breath or wheezing. 10/10/20  Yes [name], NP  amiodarone (PACERONE) 200 MG tablet Take 1 tablet (200 mg total) by mouth daily. 06/29/20  Yes Martinique, [name] M, MD   azelastine (ASTELIN) 0.1 % nasal spray Place 1 spray into both nostrils 2 (two) times daily. Use in each nostril as directed Patient taking differently: Place 1 spray into both nostrils 2 (two) times daily as needed. Use in each nostril as directed 03/02/20  Yes Martyn Ehrich, NP  Blood Glucose Monitoring Suppl [facility] VERIO) w/Device KIT Check blood sugar twice daily DX:E11.9 10/12/19  Yes [name], NP  cetirizine (ZYRTEC) 10 MG tablet Take 10 mg by mouth daily as needed for allergies.    Yes [provider]  colestipol (COLESTID) 1 g tablet Take 1 g by mouth 2 (two) times daily. 06/29/20  Yes [provider]  Dextromethorphan-guaiFENesin (MUCINEX DM) 30-600 MG TB12 Take 2 tablets by mouth as needed (cough).   Yes [provider]  doxazosin (CARDURA) 4 MG tablet TAKE 1 TABLET BY MOUTH EVERY DAY TO HELP CONTROL BLOOD PRESSURE Patient taking differently: Take 4 mg by mouth daily. 09/12/20  Yes [name], NP  ferrous sulfate 325 (65 FE) MG EC tablet Take 325 mg by mouth 2 (two) times daily.   Yes [provider]  furosemide (LASIX) 40 MG tablet TAKE 2 TABLETS BY MOUTH DAILY AND 1 TABLET BY MOUTH AT LUNCH on  Mondays, Wednesdays and Fridays Patient taking differently: Take 40-80 mg by mouth See admin instructions. 2 tablets in the morning  daily. Take 1 tablet at lunch on m,w,f in addition to morning dose 09/17/20  Yes Eubanks, Carlos American, NP  glimepiride (AMARYL) 1 MG tablet TAKE 1 TABLET(1 MG) BY MOUTH DAILY WITH BREAKFAST Patient taking differently: Take 1 mg by mouth daily with breakfast. 11/19/20  Yes [name], NP  glucose blood test strip 1 each by Other route 2 (two) times daily. OneTouch Verio DX E11.8 10/12/19  Yes Eubanks, Carlos American, NP  JANUVIA 50 MG tablet TAKE 1 TABLET(50 MG) BY MOUTH DAILY Patient taking differently: Take 50 mg by mouth daily. 10/10/20  Yes [name], NP  Lancets [facility] ULTRASOFT) lancets 1  each by Other route 2 (two) times daily. OneTouch Verio DX E11.9 10/12/19  Yes [name], NP  levothyroxine (SYNTHROID) 88 MCG tablet TAKE 1 TABLET BY MOUTH DAILY ON AN EMPTY STOMACH Patient taking differently: Take 88 mcg by mouth daily before breakfast. 08/29/20  Yes Eubanks, Carlos American, NP  OVER THE COUNTER MEDICATION Place 1 drop into both eyes daily. [facility] brand lubricating eye drop   Yes [provider]  OXYGEN Inhale 2 L into the lungs continuous. Pt also uses 4L pulse via POC   Yes [provider]  pantoprazole (PROTONIX) 40 MG tablet TAKE 1 TABLET(40 MG) BY MOUTH TWICE DAILY  Patient taking differently: Take 40 mg by mouth 2 (two) times daily. 07/11/20  Yes [name], NP  potassium gluconate 595 (99 K) MG TABS tablet Take 595 mg by mouth 2 (two) times daily.   Yes [provider]  predniSONE (DELTASONE) 10 MG tablet Take 4 tabs po daily x 3 days; then 3 tabs daily x3 days; then 2 tabs daily x3 days; then 1 tab daily x 3 days; then stop 01/14/21  Yes Martyn Ehrich, NP  rosuvastatin (CRESTOR) 10 MG tablet TAKE 1 TABLET(10 MG) BY MOUTH DAILY Patient taking differently: Take 10 mg by mouth daily. 10/31/20  Yes [name], NP  TRELEGY ELLIPTA 200-62.5-25 MCG/INH AEPB INHALE 1 PUFF INTO THE LUNGS DAILY 10/15/20  Yes Martyn Ehrich, NP  cholecalciferol (VITAMIN D) 1000 units tablet Take 1,000 Units by mouth at bedtime.     [provider]     Interim history/subjective:  Patient more awake this morning. Describes feeling sore all over but states that "tailbone" pain is improved. Describes some abdominal tenderness. Has had a decreased appeptite, particularly yesterday. States that she has passed flatus but no BM since 3/25. Patient tried bowel regimen yesterday without relief. Nurse at bedside describes some concerns for SBO vs ileus as well as need for increased inotropic support.   Objective   Blood pressure 111/69, pulse 98,  temperature 98.1 F (36.7 C), temperature source Oral, resp. rate (!) 22, height _0  (1.575 m), weight 93.8 kg, SpO2 100 %. CVP:  [11 mmHg-16 mmHg] 16 mmHg      Intake/Output Summary (Last 24 hours) at 01/23/2021 0820 Last data filed at 01/23/2021 0800 Gross per 24 hour  Intake 1501.83 ml  Output 3648 ml  Net -2146.17 ml   Filed Weights   01/21/21 0530 01/22/21 0500 01/23/21 0500  Weight: 97 kg 95.7 kg 93.8 kg    Examination: Gen: NAD, more awake this morning, conversing readily, [place] in place, evidence of deconditioning and weakness  HEENT: EOMI, MMM, anicteric sclera  CV: irregular S1/S2, no m/r/g, JVD trace / hard to appreciate (approx 8 cm), no peripheral edema, cap refill <2sec, extremities cool  Pulm: CTAB in upper fields, moving air appropriately  GI: soft, non-distended, mildly tender throughout, bowel sounds active  Neuro: Moves extremities at least against gravity - not formally assessed  Skin: LIJ HD cath in place w/out e/d/i    Ancillary tests (personally reviewed)  CBC: Recent Labs  Lab 01/17/21 0318 01/19/2021 1543 01/19/21 0928 01/20/21 0414 01/21/21 0357 01/22/21 0448 01/23/21 0425  WBC 5.6  --  5.5 6.5 6.4 8.1 8.0  NEUTROABS 4.2  --   --   --   --   --   --   HGB 9.4*   < > 9.5* 9.0* 9.3* 9.8* 10.1*  HCT 29.9*   < > 31.8* 28.2* 30.0* 31.9* 34.3*  MCV 94.0  --  94.9 91.9 93.8 95.2 97.4  PLT 133*  --  126* 126* 144* 162 157   < > = values in this interval not displayed.    Basic Metabolic Panel: Recent Labs  Lab 01/19/21 0223 01/20/21 0414 01/20/21 1638 01/21/21 0357 01/21/21 1644 01/22/21 0448 01/22/21 1600 01/23/21 0425  NA 136 133*   < > 134* 130* 134* 132* 133*  K 3.8 3.5   < > 3.6 4.0 4.4 4.6 4.6  CL 94* 92*   < > 96* 95* 100 97* 99  CO2 33* 27   < >  _0 GLUCOSE 183* 203*   < > 172* 223* 205* 216* 177*  BUN 88* 98*   < > 45* 29* _1 CREATININE 3.14* 3.54*   < > 2.03* 1.63* 1.61* 1.54* 1.51*  CALCIUM 8.5* 8.2*   < >  8.1* 8.1* 8.3* 8.3* 8.6*  MG 1.9 1.9  --  2.1  --  2.4  --  2.4  PHOS 6.6*  --    < > 2.9 2.3* 2.6 2.6 2.6   < > = values in this interval not displayed.   GFR: Estimated Creatinine Clearance: 30.7 mL/min (A) (by C-G formula based on SCr of 1.51 mg/dL (H)). Recent Labs  Lab 01/20/21 0414 01/21/21 0357 01/22/21 0448 01/23/21 0425  WBC 6.5 6.4 8.1 8.0    Liver Function Tests: Recent Labs  Lab 01/16/21 1656 01/19/21 0223 01/21/21 0357 01/21/21 1644 01/22/21 0448 01/22/21 1600 01/23/21 0425  AST 14*  --   --   --   --   --   --   ALT 13  --   --   --   --   --   --   ALKPHOS 50  --   --   --   --   --   --   BILITOT 0.6  --   --   --   --   --   --   PROT 6.4*  --   --   --   --   --   --   ALBUMIN 3.4*   < > 3.2* 3.3* 3.4* 3.4* 3.4*   < > = values in this interval not displayed.   No results for input(s): LIPASE, AMYLASE in the last 168 hours. No results for input(s): AMMONIA in the last 168 hours.  ABG    Component Value Date/Time   PHART 7.366 06/12/2020 1018   PCO2ART 57.9 (H) 06/12/2020 1018   PO2ART 84.9 06/12/2020 1018   HCO3 35.3 (H) 12/27/2020 1543   HCO3 34.5 (H) 01/19/2021 1543   TCO2 37 (H) 01/06/2021 1543   TCO2 37 (H) 01/23/2021 1543   O2SAT 71.9 01/23/2021 0425     Coagulation Profile: No results for input(s): INR, PROTIME in the last 168 hours.  Cardiac Enzymes: No results for input(s): CKTOTAL, CKMB, CKMBINDEX, TROPONINI in the last 168 hours.  HbA1C: Hgb A1c MFr Bld  Date/Time Value Ref Range Status  01/16/2021 04:37 AM 7.1 (H) 4.8 - 5.6 % Final    Comment:    (NOTE) Pre diabetes:          5.7%-6.4%  Diabetes:              >6.4%  Glycemic control for   <7.0% adults with diabetes   09/17/2020 12:00 AM 7.3 (H) <5.7 % of total Hgb Final    Comment:    For someone without known diabetes, a hemoglobin A1c value of 6.5% or greater indicates that they may have  diabetes and this should be confirmed with a follow-up  test. . For  someone with known diabetes, a value <7% indicates  that their diabetes is well controlled and a value  greater than or equal to 7% indicates suboptimal  control. A1c targets should be individualized based on  duration of diabetes, age, comorbid conditions, and  other considerations. . Currently, no consensus exists regarding use of hemoglobin A1c for diagnosis of diabetes for children. .     CBG: Recent Labs  Lab 01/22/21 0654 01/22/21 1118 01/22/21 1532 01/22/21 2149 01/23/21 0658  GLUCAP 183* 199* 210* 176* 159*     Assessment & Plan:  71 yoF remains in guarded and moderately ill condition with a relevant PMH of OSH c/b COPD and hypoxia (baseline home O2 2L) with pulmonary HTN by RHC (3/25), CKD IV, Afib, CAD w/ HFref (3/23 EF: 25-30% vs recent baseline 40-45%), s/p TAVR (09/2016), T2DM, and hx of chronic GI bleed. CVP remains elevated but patient appears to be euvolemic and near dry weight based on previous clinical documentation. HDS on significant inotropic support, continues to worsen from yesterday. Needs to have BM today.   AKI on CKD IV Anuric AKI likely multifactorial etiology with components of prerenal and cardiorenal syndrome given decompensated HFrEF c/b Afib w/ RVR and microvascular disease (T2DM). Expect kidney function to improve with normal sinus rhythm following cardioconversion.  -  CRRT w/ CVVHD net goal of near zero (~25 mL/hr)    - CVP remains elevated, prior documentation notes indicate previously normal but may be elevated at new baseline   - Maintain MAP >70 for optimal renal perfusion - Continue to trend renal function; consider svO2 PRN      - Most recent Coox remained w/in goal range  - Avoid further nephrotoxins including NSAIDS, Morphine. Unless absolutely necessary, avoid CT with contrast and/or MRI with gadolinium.    - Currently no indication for HD. However, not a good candidate long term iHD.  - Nephrology following, appreciate recommendations    Acute Excaberation on Chronic HFrEF, s/p TAVR (2017) CHF excerebration likely multifactorial with considerations towards worsening Afib w/ RVR and features of MR. Requiring increased NE support with persistent tachycardia in setting of Afib w/ RVR on amio drip. Will forgo CRRT fluid removal to net zero to determine if we can wean NE.  - As above, modify CRRT settings and wean NE as tolerated  - As above, CVP remains elevated  - BNP trend indicates improvement  - Prior to d/c, consider optimization of T2DM medications with the DPP4-inhibitor Celesta Gentile) being substituted for a SGLT-2 inhibitor  - Cardiology following, appreciate recommendations  - Repleted K and Mg as indicated   Constipation   Given lack of BM since 3/25 with mild, diffuse tenderness with flatus and active bowel sounds, patient may be experiencing an early partial SBO. Will CTM and place a suppository this afternoon if BM not produced.  - Miralax PRN  - Glycerin suppository PRN as appropriate this afternoon - KUB if indicated for acute change or lack of response to suppository   Afib w/ RVR -Continue Amioradione gtt - Per cards, TEE guided DCCV planned tomorrow - Determine long-term anticoagulation goal due to d/c of anticoagulation 2/2 chronic GI bleed   OSH, COPD, Acute on Chronic Hypercapnic Respiratory Failure  Baseline O2 requirement at home 2L. Continue to require 4-5L while inpatient potentially related to posturing versus worsening CHF.  - Titrate O2 sats 88-92% on O2  - Patient refused CPAP, CTM  - Outstanding NM pulmonary perfusion & ventilation scan prior to d/c given c/f for CTE  - Continue Breo Ellipta and Incruse Ellipta  - Pulmonology following, appreciate recommendations  Normocytic Anemia   Likely 2/2 chronic GI bleed, AICD, and CKD. Iron studies c/w AICD given elevated ferritin and low-normal TIBC (Fe wnl).  - Trend Hb  - Consider transfusion if <8 given need for optimal perfusion   Hx of GI  Bleed - Occult chronic GI bleed with unidentified source  by capsule (2019)  - Trend Hb  - CTM for evidence of change or acute hemorrhage while heparinized   C/f Sacral Decubitus Ulcer  - CTM rotations  - Wound NT consulted   Goals of Care  - Patient has expressed goal of seeing birth of great grandchild in October   - Palliative following, appreciate recommendations   Chronic Conditions T2DM - Continue SSI while in ICU  - Continue home rosuvastatin   Hypothyroidism - Continue home Levo     Daily Goals Checklist  Pain/Anxiety/Delirium protocol (if indicated): Tramadol added for PRN pain  VAP protocol (if indicated): N/A Respiratory support goals: discussed Blood pressure target: MAP > 70, discussed above DVT prophylaxis: maintain heparin dose Nutritional status and feeding goals: appetite discussed with patient  GI prophylaxis: continue pantoprazole Fluid status goals: continue CRRT  Urinary catheter: Assessment of intravascular volume Central lines: in place, no signs of infection Glucose control: continue SSI Mobility/therapy needs: continue as tolerated Antibiotic de-escalation: N/A Home medication reconciliation: To be optimized Daily labs: continue CBC, CMP Code Status: Full Family Communication: Discussed with family at bedside Disposition: TBD  Critical care time: 56 minutes      Domenick Bookbinder, MS4    Kipp Brood, MD [facility] ICU Physician [place]  Pager: [phone] Or Epic Secure Chat After hours: [phone].  01/23/2021, 8:20 AM     .

## 2021-01-23 NOTE — Progress Notes (Addendum)
[place] for heparin  Indication: atrial fibrillation  Allergies  Allergen Reactions  . Codeine Other (See Comments)    "Spaces out" the patient and she cannot walk well  . Vesicare [Solifenacin] Itching    Patient Measurements: Height: _0  (157.5 cm) Weight: 95.7 kg (210 lb 15.7 oz) IBW/kg (Calculated) : 50.1 HEPARIN DW (KG): 74.7   Vital Signs: Temp: 97.6 F (36.4 C) (03/30 0000) Temp Source: Axillary (03/30 0000) BP: 90/52 (03/30 0500) Pulse Rate: 100 (03/30 0500)  Labs: Recent Labs    01/21/21 0357 01/21/21 1300 01/21/21 2343 01/22/21 0448 01/22/21 0449 01/22/21 1600 01/23/21 0425  HGB 9.3*  --   --  9.8*  --   --  10.1*  HCT 30.0*  --   --  31.9*  --   --  34.3*  PLT 144*  --   --  162  --   --  157  APTT 46*   < > 67* 61*  --   --  65*  HEPARINUNFRC 1.00*  --  0.73*  --  0.71*  --  0.56  CREATININE 2.03*   < >  --  1.61*  --  1.54* 1.51*   < > = values in this interval not displayed.    Estimated Creatinine Clearance: 31 mL/min (A) (by C-G formula based on SCr of 1.51 mg/dL (H)).   Medical History: Past Medical History:  Diagnosis Date  . Allergy   . Anemia, unspecified   . Arthritis   . Asthma   . Atrial fibrillation status post cardioversion [facility]) 09/2015   s/p TEE/DCCV>>SR on amio  . Benign neoplasm of colon   . Carpal tunnel syndrome   . Cellulitis and abscess of finger, unspecified   . Cerumen impaction   . Cervicalgia   . CHF (congestive heart failure) ([place])   . Chronic airway obstruction, not elsewhere classified   . Chronic anticoagulation 09/2015   Eliquis  . Diarrhea   . Diffuse cystic mastopathy   . Edema   . Encounter for long-term (current) use of other medications   . GERD (gastroesophageal reflux disease)   . Heart murmur   . Lumbago   . Neck pain 05/23/2015  . Obesity, unspecified   . Other and unspecified hyperlipidemia   . Other psoriasis   . Pain in joint, lower leg   . PONV  (postoperative nausea and vomiting)   . Primary localized osteoarthrosis of right shoulder 12/18/2015  . 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 Scoliosis (and kyphoscoliosis), idiopathic   . Shortness of breath dyspnea    with exertion  . Type II or unspecified type diabetes mellitus without mention of complication, uncontrolled   . Unspecified essential hypertension   . Unspecified hemorrhoids without mention of complication   . Unspecified hypothyroidism   . Urge incontinence     Medications:  Medications Prior to Admission  Medication Sig Dispense Refill Last Dose  . acetaminophen (TYLENOL) 650 MG CR tablet Take 1,300 mg by mouth every 8 (eight) hours as needed for pain.   Past Month at Unknown time  . albuterol (PROVENTIL) (2.5 MG/3ML) 0.083% nebulizer solution USE 1 VIAL IN NEBULIZER EVERY 4 HOURS AND AS NEEDED. Generic: VENTOLIN (Patient taking differently: Take 2.5 mg by nebulization every 4 (four) hours as needed for shortness of breath or wheezing.) 150 mL 3 Past  Month at Unknown time  . albuterol (VENTOLIN HFA) 108 (90 Base) MCG/ACT inhaler INHALE 2 PUFFS INTO THE LUNGS EVERY 6 HOURS AS NEEDED FOR WHEEZING OR SHORTNESS OF BREATH (Patient taking differently: Inhale 2 puffs into the lungs every 6 (six) hours as needed for shortness of breath or wheezing.) 6.7 g 3 Past Month at Unknown time  . amiodarone (PACERONE) 200 MG tablet Take 1 tablet (200 mg total) by mouth daily. 30 tablet 11 01/07/2021 at Unknown time  . azelastine (ASTELIN) 0.1 % nasal spray Place 1 spray into both nostrils 2 (two) times daily. Use in each nostril as directed (Patient taking differently: Place 1 spray into both nostrils 2 (two) times daily as needed. Use in each nostril as directed) 30 mL 1 Past Month at Unknown time  . Blood Glucose Monitoring Suppl (ONETOUCH VERIO) w/Device KIT Check  blood sugar twice daily DX:E11.9 1 kit 0   . cetirizine (ZYRTEC) 10 MG tablet Take 10 mg by mouth daily as needed for allergies.    unk  . colestipol (COLESTID) 1 g tablet Take 1 g by mouth 2 (two) times daily.   12/27/2020 at Unknown time  . Dextromethorphan-guaiFENesin (MUCINEX DM) 30-600 MG TB12 Take 2 tablets by mouth as needed (cough).   Past Month at Unknown time  . doxazosin (CARDURA) 4 MG tablet TAKE 1 TABLET BY MOUTH EVERY DAY TO HELP CONTROL BLOOD PRESSURE (Patient taking differently: Take 4 mg by mouth daily.) 90 tablet 1 01/22/2021 at Unknown time  . ferrous sulfate 325 (65 FE) MG EC tablet Take 325 mg by mouth 2 (two) times daily.   01/23/2021 at Unknown time  . furosemide (LASIX) 40 MG tablet TAKE 2 TABLETS BY MOUTH DAILY AND 1 TABLET BY MOUTH AT LUNCH on  Mondays, Wednesdays and Fridays (Patient taking differently: Take 40-80 mg by mouth See admin instructions. 2 tablets in the morning  daily. Take 1 tablet at lunch on m,w,f in addition to morning dose) 270 tablet 1 01/21/2021 at Unknown time  . glimepiride (AMARYL) 1 MG tablet TAKE 1 TABLET(1 MG) BY MOUTH DAILY WITH BREAKFAST (Patient taking differently: Take 1 mg by mouth daily with breakfast.) 90 tablet 1 01/14/2021 at Unknown time  . glucose blood test strip 1 each by Other route 2 (two) times daily. OneTouch Verio DX E11.8 200 each 11   . JANUVIA 50 MG tablet TAKE 1 TABLET(50 MG) BY MOUTH DAILY (Patient taking differently: Take 50 mg by mouth daily.) 30 tablet 6 01/02/2021 at Unknown time  . Lancets (ONETOUCH ULTRASOFT) lancets 1 each by Other route 2 (two) times daily. OneTouch Verio DX E11.9 200 each 11   . levothyroxine (SYNTHROID) 88 MCG tablet TAKE 1 TABLET BY MOUTH DAILY ON AN EMPTY STOMACH (Patient taking differently: Take 88 mcg by mouth daily before breakfast.) 90 tablet 1 01/06/2021 at Unknown time  . OVER THE COUNTER MEDICATION Place 1 drop into both eyes daily. [facility] brand lubricating eye drop   12/30/2020 at Unknown time  .  OXYGEN Inhale 2 L into the lungs continuous. Pt also uses 4L pulse via POC     . pantoprazole (PROTONIX) 40 MG tablet TAKE 1 TABLET(40 MG) BY MOUTH TWICE DAILY (Patient taking differently: Take 40 mg by mouth 2 (two) times daily.) 180 tablet 1 12/26/2020 at Unknown time  . potassium gluconate 595 (99 K) MG TABS tablet Take 595 mg by mouth 2 (two) times daily.   01/14/2021 at Unknown time  . predniSONE (DELTASONE) 10 MG  tablet Take 4 tabs po daily x 3 days; then 3 tabs daily x3 days; then 2 tabs daily x3 days; then 1 tab daily x 3 days; then stop 30 tablet 0 12/30/2020 at Unknown time  . rosuvastatin (CRESTOR) 10 MG tablet TAKE 1 TABLET(10 MG) BY MOUTH DAILY (Patient taking differently: Take 10 mg by mouth daily.) 90 tablet 0 01/09/2021 at Unknown time  . TRELEGY ELLIPTA 200-62.5-25 MCG/INH AEPB INHALE 1 PUFF INTO THE LUNGS DAILY 60 each 6 01/06/2021 at Unknown time  . cholecalciferol (VITAMIN D) 1000 units tablet Take 1,000 Units by mouth at bedtime.    01/14/2021   Scheduled:  . Chlorhexidine Gluconate Cloth  6 each Topical Daily  . fluticasone furoate-vilanterol  1 puff Inhalation Daily   And  . umeclidinium bromide  1 puff Inhalation Daily  . insulin aspart  0-5 Units Subcutaneous QHS  . insulin aspart  0-9 Units Subcutaneous TID WC  . levothyroxine  88 mcg Oral Q0600  . midodrine  15 mg Oral TID WC  . pantoprazole  40 mg Oral BID  . QUEtiapine  12.5 mg Oral QHS  . rosuvastatin  10 mg Oral Daily  . sodium chloride flush  10-40 mL Intracatheter Q12H  . sodium chloride flush  3 mL Intravenous Q12H  . sodium chloride flush  3 mL Intravenous Q12H    Assessment: 85 yo female with HF and AKI noted on CRRT. She was on apixaban (last dose 3/26 at ~ 6pm) and pharmacy to start heparin   Follow up aPTT 65, HL therapeutic at 0.56, closer to correlating now. No issues with heparin gtt noted. CBC remains stable. Will transition to monitoring HLs only now.   Goal of Therapy:  Heparin level 0.3-0.7  units/ml Monitor platelets by anticoagulation protocol: Yes   Plan:  Continue IV heparin at 1,300 units/hr Follow daily HL, CBC, s/sx bleeding  Mercy Riding, PharmD PGY1 Acute Care Pharmacy Resident Please refer to [facility] for unit-specific pharmacist

## 2021-01-23 NOTE — Progress Notes (Signed)
Occupational Therapy Treatment Patient Details Name: Patricia [name] MRN: [id number] DOB: 1936/02/03 Today's Date: 01/23/2021    History of present illness 85 y.o. female admitted 01/16/2021 with worsening SOB and BLE edema. Pt with pulmonary edema and trace to small bilateral pleural effusions. CHF exacerbation with biventricular failure EF 25-30% and AKI with transition to ICU 3/27 with CRRT started. PMHx: HTN, DM, COPD, afib (not on anticoag due to GIB), anemia, s/p TAVR (2017), HLD   OT comments  Pt seen in conjunction with PT. OOB and EOB activity deferred due to pt fatigue.  She was moved into chair position and worked on pulling forward with bedrails with min A +1-2.  She fatigued quickly.  RN present.    Follow Up Recommendations  SNF    Equipment Recommendations  None recommended by OT    Recommendations for Other Services      Precautions / Restrictions Precautions Precautions: Fall Precaution Comments: CRRT       Mobility Bed Mobility Overal bed mobility: Needs Assistance Bed Mobility: Supine to Sit     Supine to sit: Min assist;HOB elevated Sit to supine: Min assist   General bed mobility comments: Use of the bed to attain a upright sitting posture for assessing sitting tolerance.  pt pulled forward away from the raised mattress with min of 1 to 2 persons. Pt kept attempting to grab each rail, but unable to maintain grip consistently L >R UE    Transfers                 General transfer comment: unable to attempt this date due to pt fatigue    Balance Overall balance assessment: Needs assistance Sitting-balance support: No upper extremity supported;Feet supported Sitting balance-Leahy Scale: Poor Sitting balance - Comments: pt needed assist to stay forward away from the raised [facility] and tended to fall posteriorly                                   ADL either performed or assessed with clinical judgement   ADL Overall ADL's : Needs  assistance/impaired     Grooming: Wash/dry hands;Wash/dry face;Moderate assistance;Bed level                                       Vision       Perception     Praxis      Cognition Arousal/Alertness: Awake/alert Behavior During Therapy: Flat affect Overall Cognitive Status: Impaired/Different from baseline Area of Impairment: Attention;Awareness                   Current Attention Level: Sustained           General Comments: mildly confused, difficulty attending        Exercises     Shoulder Instructions       General Comments EOB and OOB activity deferred due to pt fatigue.  She was moved into chair position in the bed.  She was able to pull self into unsupported sitting with min A +1-2 people x 3.    Pertinent Vitals/ Pain       Pain Assessment: Faces Faces Pain Scale: Hurts a little bit Pain Location: buttock sore Pain Descriptors / Indicators: Sore Pain Intervention(s): Monitored during session  Home Living  Prior Functioning/Environment              Frequency  Min 2X/week        Progress Toward Goals  OT Goals(current goals can now be found in the care plan section)  Progress towards OT goals: Not progressing toward goals - comment (pt fatigued)  Acute Rehab OT Goals Patient Stated Goal: Hoping to get stronger, my legs are feeling weak  Plan Frequency needs to be updated    Co-evaluation    PT/OT/SLP Co-Evaluation/Treatment: Yes Reason for Co-Treatment: Complexity of the patient's impairments (multi-system involvement);For patient/therapist safety   OT goals addressed during session: Strengthening/ROM      AM-PAC OT "6 Clicks" Daily Activity     Outcome Measure   Help from another person eating meals?: A Lot Help from another person taking care of personal grooming?: A Lot Help from another person toileting, which includes using toliet, bedpan,  or urinal?: A Lot Help from another person bathing (including washing, rinsing, drying)?: Total Help from another person to put on and taking off regular upper body clothing?: A Lot Help from another person to put on and taking off regular lower body clothing?: Total 6 Click Score: 10    End of Session Equipment Utilized During Treatment: Oxygen  OT Visit Diagnosis: Unsteadiness on feet (R26.81);Muscle weakness (generalized) (M62.81);History of falling (Z91.81);Dizziness and giddiness (R42)   Activity Tolerance Patient limited by fatigue;Patient limited by lethargy   Patient Left in bed;with call bell/phone within reach;with nursing/sitter in room   Nurse Communication Mobility status        Time: 4445-8483 OT Time Calculation (min): 23 min  Charges: OT General Charges $OT Visit: 1 Visit OT Treatments $Therapeutic Activity: 8-22 mins  Nilsa Nutting., OTR/L Acute Rehabilitation Services Pager [phone] Office [phone]    Lucille Passy M 01/23/2021, 5:56 PM

## 2021-01-23 NOTE — Plan of Care (Signed)

## 2021-01-23 NOTE — Progress Notes (Signed)
[facility] Progress Note    Assessment/ Plan:   AKI on CKD 4 -Baseline creatinine seems to range around 1.6-2 with underlying proteinuria.  Suspect is a component of diabetic kidney disease and hypertensive arterionephrosclerosis -AKI likely related to cardiorenal syndrome -Initiated CRRT 3/27 via temp cath in setting of worsening renal function and volume overload refractory to max medical therapies; running ok after addition of heparin -Off lasix gtt given lack of response, UFR net neg 112m/hr which I think is appropriate at this time -Ongoing disucssion re: feasibility and utility of long term RRT in settting of numerous comorbidities; palliative care is aiding in discussion, appreciated.  For now we are hopeful that off loading volume and cardioversion may restore renal perfusion and improve function to liberate from RRT or at least vasopressor support.  Unfortunately she's now anuric.  -Continue to avoid nephrotoxins and dose meds for CRRT  Acute decompensated biventricular failure -Cardio consulting, status post RHC 01/19/2021.  On NE (started after CRRT), off milrinone as of 3/29. CVVHD cont for UF -Palliative consulting  Afib w/ RVR -on amio gtt -tee guided dccv once volume has improved - cardiology thought perhaps as early as 3/30  Acute on chronic hypoxic and hypercapnic respiratory failure, history of OHS, COPD - improving with ultrafiltration  Hypotension, history of hypertension -Likely secondary to poor cardiac output - on  NE  DM2 with hyperglycemia -Management per primary service  Anemia of chronic disease and chronic IDA - transfuse for hemoglobin less than 7, per primary  Subjective:   CRRT continues - I/Os 1.6/3.3, net neg 1.7L, UOP 037mOngoing nausea Wishes to continue current scope of care.     Objective:   BP (!) 90/52   Pulse 100   Temp 97.6 F (36.4 C) (Axillary)   Resp 16   Ht _0  (1.575 m)   Wt 93.8 kg   SpO2 99%   BMI  37.82 kg/m   Intake/Output Summary (Last 24 hours) at 01/23/2021 [id number] data filed at 01/23/2021 0500 Gross per 24 hour  Intake 1668.38 ml  Output 3580 ml  Net -1911.62 ml   Weight change: -1.9 kg  Physical Exam: Gen:nad, sitting in bed on CRRT  CVS:  irregularly irregular Resp: normal WOB at rest  Abd: Obese, soft, nontender Ext: 1+ pitting edema bilateral lower extremities - improved c/w yesterday Neuro: Speech and coherent, moves all extremities spontaneously  Imaging: DG Chest 1 View  Result Date: 01/23/2021 CLINICAL DATA:  Follow-up pulmonary edema EXAM: CHEST  1 VIEW COMPARISON:  01/22/2021 FINDINGS: Cardiac shadow remains enlarged. Changes of prior TAVR are seen. Dialysis catheter is again noted from the left jugular approach. Small bilateral pleural effusions are noted right greater than left stable from the prior exam. Persistent vascular congestion and mild edema are noted and stable. No bony abnormality is seen. IMPRESSION: Stable appearance of the chest when compared with the prior exam. Electronically Signed   By: MaInez Catalina.D.   On: 01/23/2021 04:02   DG CHEST PORT 1 VIEW  Result Date: 01/22/2021 CLINICAL DATA:  Shortness of breath EXAM: PORTABLE CHEST 1 VIEW COMPARISON:  January 20, 2021 FINDINGS: Central catheter tip is at the junction of the left innominate vein and superior vena cava. No pneumothorax. There are small pleural effusions bilaterally. There is cardiomegaly with pulmonary venous hypertension. There is slight interstitial edema. There is atelectatic change in the right base with suspected associated consolidation in the right base. Patient is status post aortic valve  replacement. There is aortic atherosclerosis. No adenopathy. There is a total shoulder replacement on the right. IMPRESSION: Central catheter as described without pneumothorax. There is cardiomegaly with pulmonary vascular congestion. Pleural effusions bilaterally with mild interstitial edema.  Suspect a degree of underlying congestive heart failure. Opacity right base may represent atelectasis with a degree of superimposed developing pneumonia. Aortic Atherosclerosis (ICD10-I70.0). Electronically Signed   By: Lowella Grip III M.D.   On: 01/22/2021 12:09    Labs: BMET Recent Labs  Lab 01/19/21 0223 01/20/21 0414 01/20/21 1638 01/21/21 0357 01/21/21 1644 01/22/21 0448 01/22/21 1600 01/23/21 0425  NA 136 133* 132* 134* 130* 134* 132* 133*  K 3.8 3.5 3.2* 3.6 4.0 4.4 4.6 4.6  CL 94* 92* 92* 96* 95* 100 97* 99  CO2 33* 27 32 _0 GLUCOSE 183* 203* 176* 172* 223* 205* 216* 177*  BUN 88* 98* 77* 45* 29* _1 CREATININE 3.14* 3.54* 2.74* 2.03* 1.63* 1.61* 1.54* 1.51*  CALCIUM 8.5* 8.2* 7.9* 8.1* 8.1* 8.3* 8.3* 8.6*  PHOS 6.6*  --  4.2 2.9 2.3* 2.6 2.6 2.6   CBC Recent Labs  Lab 01/17/21 0318 01/17/2021 1543 01/20/21 0414 01/21/21 0357 01/22/21 0448 01/23/21 0425  WBC 5.6   < > 6.5 6.4 8.1 8.0  NEUTROABS 4.2  --   --   --   --   --   HGB 9.4*   < > 9.0* 9.3* 9.8* 10.1*  HCT 29.9*   < > 28.2* 30.0* 31.9* 34.3*  MCV 94.0   < > 91.9 93.8 95.2 97.4  PLT 133*   < > 126* 144* 162 157   < > = values in this interval not displayed.    Medications:    . Chlorhexidine Gluconate Cloth  6 each Topical Daily  . fluticasone furoate-vilanterol  1 puff Inhalation Daily   And  . umeclidinium bromide  1 puff Inhalation Daily  . insulin aspart  0-5 Units Subcutaneous QHS  . insulin aspart  0-9 Units Subcutaneous TID WC  . levothyroxine  88 mcg Oral Q0600  . midodrine  15 mg Oral TID WC  . pantoprazole  40 mg Oral BID  . QUEtiapine  12.5 mg Oral QHS  . rosuvastatin  10 mg Oral Daily  . sodium chloride flush  10-40 mL Intracatheter Q12H  . sodium chloride flush  3 mL Intravenous Q12H  . sodium chloride flush  3 mL Intravenous Q12H      Jannifer Hick MD [facility] Pager [phone]

## 2021-01-23 NOTE — Anesthesia Preprocedure Evaluation (Addendum)
Anesthesia Evaluation  Patient identified by MRN, date of birth, ID band Patient awake    Reviewed: Allergy & Precautions, NPO status , Patient's Chart, lab work & pertinent test results, reviewed documented beta blocker date and time   History of Anesthesia Complications (+) PONV and history of anesthetic complications  Airway Mallampati: II  TM Distance: >3 FB Neck ROM: Full    Dental  (+) Partial Lower, Partial Upper, Dental Advisory Given   Pulmonary shortness of breath, with exertion, at rest and lying, asthma , COPD,  COPD inhaler, former smoker,    Pulmonary exam normal breath sounds clear to auscultation       Cardiovascular Exercise Tolerance: Poor hypertension, Pt. on medications +CHF  + dysrhythmias Atrial Fibrillation + Valvular Problems/Murmurs AS  Rhythm:Irregular Rate:Normal  S/P TAVR 10/14/16  Echo 01/16/21 1. Left ventricular ejection fraction, by estimation, is 25 to 30%. The left ventricle has severely decreased function. The left ventricle demonstrates regional wall motion abnormalities) Abnormal (paradoxical) septal motion, consistent with left bundle branch block. There is severe left ventricular hypertrophy. Left ventricular diastolic parameters are indeterminate.  2. Right ventricular systolic function is moderately reduced. The right ventricular size is normal. There is moderately elevated pulmonary artery systolic pressure. The estimated right ventricular systolic pressure is  91.5 mmHg.  3. Left atrial size was severely dilated.  4. Right atrial size was moderately dilated.  5. The mitral valve is abnormal. Mild to moderate mitral valve regurgitation. Severe mitral annular calcification.  6. There is a 23 mm Edwards Sapien prosthetic, stented (TAVR) valve present in the aortic position. Aortic valve regurgitation is not visualized. Aortic valve mean gradient measures 12.0 mmHg, which is reduced from  prior echo 11/07/19, likely due to decreased systolic function. Vmax 2.1 m/s, MG 12 mmHg, EOA 1.1 cm^2, DI 0.44  7. The inferior vena cava is dilated in size with <50% respiratory variability, suggesting right atrial pressure of 15 mmHg.   EKG 01/16/21 Atrial fibrillation with RVR, LBBB pattern, LVH   Neuro/Psych  Neuromuscular disease negative psych ROS   GI/Hepatic Neg liver ROS, GERD  Medicated and Controlled,  Endo/Other  diabetes, Poorly Controlled, Type 2, Oral Hypoglycemic AgentsHypothyroidism Obesity Hyperlipidemia  Renal/GU Renal InsufficiencyRenal diseaseOn CRRT Bladder dysfunction  Urinary incontinence    Musculoskeletal  (+) Arthritis , Osteoarthritis,    Abdominal (+) + obese,   Peds  Hematology  (+) anemia , ELiquis therapy- last dose    Anesthesia Other Findings   Reproductive/Obstetrics                            Anesthesia Physical Anesthesia Plan  ASA: IV  Anesthesia Plan: General   Post-op Pain Management:    Induction: Intravenous  PONV Risk Score and Plan: 4 or greater and Treatment may vary due to age or medical condition and Propofol infusion  Airway Management Planned: Natural Airway and Nasal Cannula  Additional Equipment:   Intra-op Plan:   Post-operative Plan:   Informed Consent: I have reviewed the patients History and Physical, chart, labs and discussed the procedure including the risks, benefits and alternatives for the proposed anesthesia with the patient or authorized representative who has indicated his/her understanding and acceptance.     Dental advisory given  Plan Discussed with: CRNA and Anesthesiologist  Anesthesia Plan Comments:        Anesthesia Quick Evaluation

## 2021-01-23 NOTE — Progress Notes (Signed)
Physical Therapy Treatment Patient Details Name: Patricia [name] MRN: [id number] DOB: 09-Jul-1936 Today's Date: 01/23/2021    History of Present Illness 85 y.o. female admitted 01/17/2021 with worsening SOB and BLE edema. Pt with pulmonary edema and trace to small bilateral pleural effusions. CHF exacerbation with biventricular failure EF 25-30% and AKI with transition to ICU 3/27 with CRRT started. PMHx: HTN, DM, COPD, afib (not on anticoag due to GIB), anemia, s/p TAVR (2017), HLD    PT Comments    Pt not feeling well and not tolerating mobility/therapy well today.  Emphasis assessing tolerance for sitting upright in bed in lieu of transitioning to EOB and working on balance and exercise.  Pt fatigued quickly in bed egress position.   Follow Up Recommendations  Other (comment) (TBD, as pt's status moderates)     Equipment Recommendations  None recommended by PT    Recommendations for Other Services       Precautions / Restrictions Precautions Precautions: Fall Precaution Comments: CRRT    Mobility  Bed Mobility Overal bed mobility: Needs Assistance Bed Mobility: Supine to Sit     Supine to sit: Min assist;HOB elevated Sit to supine: Min assist   General bed mobility comments: Use of the bed to attain a upright sitting posture for assessing sitting tolerance.  pt pulled forward away from the raised mattress with min of 1 to 2 persons. Pt kept attempting to grab each rail, but unable to maintain grip consistently L >R UE    Transfers                 General transfer comment: Based on pt's mentation, phsical appearance and reaction to sitting upright in the bed, transfers were deferred today in lieu of further upright sitting tolerance and BP's  Ambulation/Gait                 Stairs             Wheelchair Mobility    Modified Rankin (Stroke Patients Only)       Balance Overall balance assessment: Needs assistance   Sitting balance-Leahy  Scale: Poor Sitting balance - Comments: pt needed assist to stay forward away from the raised HOB and tended to fall posteriorly                                    Cognition Arousal/Alertness: Awake/alert Behavior During Therapy: Flat affect Overall Cognitive Status: Impaired/Different from baseline (NT formally) Area of Impairment: Attention;Awareness                   Current Attention Level: Sustained           General Comments: mildly confused, low attention level      Exercises      General Comments General comments (skin integrity, edema, etc.): CRRT did not hinder therapy today.  Pt not feeling well, dry heaved/Burped x1 after transition to sitting from Gillis ~30*  BP squewed high due to shaky/tremulous UE's  150-160/110's  (130's)      Pertinent Vitals/Pain Pain Assessment: Faces Faces Pain Scale: Hurts a little bit Pain Location: buttock sore Pain Descriptors / Indicators: Sore Pain Intervention(s): Monitored during session;Repositioned    Home Living                      Prior Function  PT Goals (current goals can now be found in the care plan section) Acute Rehab PT Goals Patient Stated Goal: Hoping to get stronger, my legs are feeling weak PT Goal Formulation: With patient Time For Goal Achievement: 01/30/21 Potential to Achieve Goals: Fair Progress towards PT goals: Not progressing toward goals - comment (low arousal/confusion/low mobility tolerance today)    Frequency    Min 3X/week      PT Plan Current plan remains appropriate    Co-evaluation PT/OT/SLP Co-Evaluation/Treatment: Yes Reason for Co-Treatment: Complexity of the patient's impairments (multi-system involvement);Other (comment) (On CRRT)          AM-PAC PT "6 Clicks" Mobility   Outcome Measure  Help needed turning from your back to your side while in a flat bed without using bedrails?: A Little Help needed moving from lying on your  back to sitting on the side of a flat bed without using bedrails?: A Lot Help needed moving to and from a bed to a chair (including a wheelchair)?: A Lot Help needed standing up from a chair using your arms (e.g., wheelchair or bedside chair)?: A Lot Help needed to walk in hospital room?: A Lot Help needed climbing 3-5 steps with a railing? : Total 6 Click Score: 12    End of Session Equipment Utilized During Treatment: Oxygen Activity Tolerance: Patient limited by fatigue;Patient limited by lethargy Patient left: in bed;with call bell/phone within reach Nurse Communication: Mobility status PT Visit Diagnosis: Other abnormalities of gait and mobility (R26.89);Muscle weakness (generalized) (M62.81)     Time: 7615-1834 PT Time Calculation (min) (ACUTE ONLY): 23 min  Charges:  $Therapeutic Activity: 8-22 mins                     01/23/2021  Ginger Carne., PT Acute Rehabilitation Services [phone]  (pager) [phone]  (office)   Patricia [name] 01/23/2021, 5:12 PM

## 2021-01-24 ENCOUNTER — Encounter (HOSPITAL_COMMUNITY): Admission: EM | Disposition: E | Payer: Self-pay | Source: Home / Self Care | Attending: Internal Medicine

## 2021-01-24 ENCOUNTER — Inpatient Hospital Stay (HOSPITAL_COMMUNITY): Payer: Medicare Other | Admitting: Anesthesiology

## 2021-01-24 ENCOUNTER — Inpatient Hospital Stay (HOSPITAL_COMMUNITY): Payer: Medicare Other

## 2021-01-24 ENCOUNTER — Encounter (HOSPITAL_COMMUNITY): Payer: Self-pay | Admitting: Internal Medicine

## 2021-01-24 DIAGNOSIS — J9621 Acute and chronic respiratory failure with hypoxia: Secondary | ICD-10-CM | POA: Diagnosis not present

## 2021-01-24 DIAGNOSIS — N179 Acute kidney failure, unspecified: Secondary | ICD-10-CM | POA: Diagnosis not present

## 2021-01-24 DIAGNOSIS — Q211 Atrial septal defect: Secondary | ICD-10-CM

## 2021-01-24 DIAGNOSIS — J449 Chronic obstructive pulmonary disease, unspecified: Secondary | ICD-10-CM | POA: Diagnosis not present

## 2021-01-24 DIAGNOSIS — I4891 Unspecified atrial fibrillation: Secondary | ICD-10-CM

## 2021-01-24 DIAGNOSIS — Z789 Other specified health status: Secondary | ICD-10-CM

## 2021-01-24 DIAGNOSIS — I5023 Acute on chronic systolic (congestive) heart failure: Secondary | ICD-10-CM | POA: Diagnosis not present

## 2021-01-24 DIAGNOSIS — I509 Heart failure, unspecified: Secondary | ICD-10-CM | POA: Diagnosis not present

## 2021-01-24 DIAGNOSIS — I361 Nonrheumatic tricuspid (valve) insufficiency: Secondary | ICD-10-CM

## 2021-01-24 DIAGNOSIS — I340 Nonrheumatic mitral (valve) insufficiency: Secondary | ICD-10-CM

## 2021-01-24 HISTORY — PX: TEE WITHOUT CARDIOVERSION: SHX5443

## 2021-01-24 HISTORY — PX: CARDIOVERSION: SHX1299

## 2021-01-24 LAB — CBC
HCT: 33.9 % — ABNORMAL LOW (ref 36.0–46.0)
Hemoglobin: 10.2 g/dL — ABNORMAL LOW (ref 12.0–15.0)
MCH: 29.5 pg (ref 26.0–34.0)
MCHC: 30.1 g/dL (ref 30.0–36.0)
MCV: 98 fL (ref 80.0–100.0)
Platelets: 140 10*3/uL — ABNORMAL LOW (ref 150–400)
RBC: 3.46 MIL/uL — ABNORMAL LOW (ref 3.87–5.11)
RDW: 17.1 % — ABNORMAL HIGH (ref 11.5–15.5)
WBC: 7.1 10*3/uL (ref 4.0–10.5)
nRBC: 4.2 % — ABNORMAL HIGH (ref 0.0–0.2)

## 2021-01-24 LAB — RENAL FUNCTION PANEL
Albumin: 3.3 g/dL — ABNORMAL LOW (ref 3.5–5.0)
Albumin: 3.5 g/dL (ref 3.5–5.0)
Anion gap: 6 (ref 5–15)
Anion gap: 4 — ABNORMAL LOW (ref 5–15)
BUN: 14 mg/dL (ref 8–23)
BUN: 16 mg/dL (ref 8–23)
CO2: 26 mmol/L (ref 22–32)
CO2: 28 mmol/L (ref 22–32)
Calcium: 8.4 mg/dL — ABNORMAL LOW (ref 8.9–10.3)
Calcium: 8.5 mg/dL — ABNORMAL LOW (ref 8.9–10.3)
Chloride: 101 mmol/L (ref 98–111)
Chloride: 100 mmol/L (ref 98–111)
Creatinine, Ser: 1.38 mg/dL — ABNORMAL HIGH (ref 0.44–1.00)
Creatinine, Ser: 1.53 mg/dL — ABNORMAL HIGH (ref 0.44–1.00)
GFR, Estimated: 38 mL/min — ABNORMAL LOW (ref >60)
GFR, Estimated: 34 mL/min — ABNORMAL LOW (ref >60)
Glucose, Bld: 162 mg/dL — ABNORMAL HIGH (ref 70–99)
Glucose, Bld: 171 mg/dL — ABNORMAL HIGH (ref 70–99)
Phosphorus: 2.3 mg/dL — ABNORMAL LOW (ref 2.5–4.6)
Phosphorus: 1.9 mg/dL — ABNORMAL LOW (ref 2.5–4.6)
Potassium: 4.7 mmol/L (ref 3.5–5.1)
Potassium: 4.8 mmol/L (ref 3.5–5.1)
Sodium: 133 mmol/L — ABNORMAL LOW (ref 135–145)
Sodium: 132 mmol/L — ABNORMAL LOW (ref 135–145)

## 2021-01-24 LAB — GLUCOSE, CAPILLARY
Glucose-Capillary: 155 mg/dL — ABNORMAL HIGH (ref 70–99)
Glucose-Capillary: 155 mg/dL — ABNORMAL HIGH (ref 70–99)
Glucose-Capillary: 188 mg/dL — ABNORMAL HIGH (ref 70–99)
Glucose-Capillary: 137 mg/dL — ABNORMAL HIGH (ref 70–99)

## 2021-01-24 LAB — COOXEMETRY PANEL
Carboxyhemoglobin: 1 % (ref 0.5–1.5)
Methemoglobin: 0.9 % (ref 0.0–1.5)
O2 Saturation: 73.5 %
Total hemoglobin: 10.1 g/dL — ABNORMAL LOW (ref 12.0–16.0)

## 2021-01-24 LAB — APTT: aPTT: 75 seconds — ABNORMAL HIGH (ref 24–36)

## 2021-01-24 LAB — PROTIME-INR
INR: 1.3 — ABNORMAL HIGH (ref 0.8–1.2)
Prothrombin Time: 16.1 seconds — ABNORMAL HIGH (ref 11.4–15.2)

## 2021-01-24 LAB — HEPARIN LEVEL (UNFRACTIONATED): Heparin Unfractionated: 0.56 IU/mL (ref 0.30–0.70)

## 2021-01-24 LAB — PHOSPHORUS: Phosphorus: 1.8 mg/dL — ABNORMAL LOW (ref 2.5–4.6)

## 2021-01-24 LAB — MAGNESIUM: Magnesium: 2.4 mg/dL (ref 1.7–2.4)

## 2021-01-24 SURGERY — ECHOCARDIOGRAM, TRANSESOPHAGEAL
Anesthesia: General

## 2021-01-24 MED ORDER — PROPOFOL 10 MG/ML IV BOLUS
INTRAVENOUS | Status: DC | PRN
  Administered 2021-01-24: 15 mg via INTRAVENOUS

## 2021-01-24 MED ORDER — BUTAMBEN-TETRACAINE-BENZOCAINE 2-2-14 % EX AERO
INHALATION_SPRAY | CUTANEOUS | Status: DC | PRN
  Administered 2021-01-24: 2 via TOPICAL

## 2021-01-24 MED ORDER — PROPOFOL 500 MG/50ML IV EMUL
INTRAVENOUS | Status: DC | PRN
  Administered 2021-01-24: 100 ug/kg/min via INTRAVENOUS

## 2021-01-24 MED ORDER — SODIUM PHOSPHATES 45 MMOLE/15ML IV SOLN
20.0000 mmol | Freq: Once | INTRAVENOUS | Status: AC
  Administered 2021-01-24: 20 mmol via INTRAVENOUS
  Filled 2021-01-24: qty 6.67

## 2021-01-24 MED ORDER — LIDOCAINE 2% (20 MG/ML) 5 ML SYRINGE
INTRAMUSCULAR | Status: DC | PRN
  Administered 2021-01-24: 60 mg via INTRAVENOUS

## 2021-01-24 MED ORDER — AMIODARONE HCL 200 MG PO TABS
200.0000 mg | ORAL_TABLET | Freq: Every day | ORAL | Status: DC
  Administered 2021-01-24 – 2021-01-25 (×2): 200 mg via ORAL
  Filled 2021-01-24 (×2): qty 1

## 2021-01-24 MED ORDER — FENTANYL CITRATE (PF) 100 MCG/2ML IJ SOLN
INTRAMUSCULAR | Status: DC | PRN
  Administered 2021-01-24: 25 ug via INTRAVENOUS

## 2021-01-24 NOTE — Progress Notes (Signed)
Patient ID: Patricia [name], female   DOB: 03-02-1936, 85 y.o.   MRN: [id number] P    Advanced Heart Failure Rounding Note  PCP-Cardiologist: [name] Martinique, MD   Subjective:    CVVH started 3/27, I/Os negative again.  CVVH stopped this morning for TEE-DCCV in endoscopy.   Remains on amio 30 mg/hr + Norepi 1-2 mcg. CO-OX 74%. CVP recorded around 16 earlier today by nurse.   Not nauseated this morning.   TEE-DCCV today.  TEE showed EF 25-30%, diffuse hypokinesis with septal-lateral dyssynchrony, moderate RV dysfunction, moderate TR, moderate MR, bioprosthetic aortic valve s/p TAVR with mean gradient 15 mmHg and mild peri-valvular leakage.    Objective:   Weight Range: 94.2 kg Body mass index is 37.98 kg/m.   Vital Signs:   Temp:  [97.1 F (36.2 C)-97.8 F (36.6 C)] 97.7 F (36.5 C) (03/31 0832) Pulse Rate:  [51-155] 51 (03/31 0832) Resp:  [13-24] 14 (03/31 0832) BP: (89-148)/(43-115) 142/45 (03/31 0832) SpO2:  [90 %-100 %] 100 % (03/31 0832) Weight:  [94.2 kg] 94.2 kg (03/31 0500) Last BM Date: 01/01/2021  Weight change: Filed Weights   01/22/21 0500 01/23/21 0500 12/31/2020 0500  Weight: 95.7 kg 93.8 kg 94.2 kg    Intake/Output:   Intake/Output Summary (Last 24 hours) at 01/13/2021 0836 Last data filed at 01/23/2021 0600 Gross per 24 hour  Intake 1224.55 ml  Output 3351 ml  Net -2126.45 ml      Physical Exam   General: NAD Neck: Thick, JVP 12+ cm, no thyromegaly or thyroid nodule.  Lungs: Clear to auscultation bilaterally with normal respiratory effort. CV: Nondisplaced PMI.  Heart irregular S1/S2, no S3/S4, no murmur.  1+ ankle edema.   Abdomen: Soft, nontender, no hepatosplenomegaly, no distention.  Skin: Intact without lesions or rashes.  Neurologic: Alert and oriented x 3.  Psych: Normal affect. Extremities: No clubbing or cyanosis.  HEENT: Normal.    Telemetry    A fib 100-110s => NSR 50s with DCCV (personally reviewed)  Labs    CBC Recent Labs     01/23/21 0425 01/03/2021 0338  WBC 8.0 7.1  HGB 10.1* 10.2*  HCT 34.3* 33.9*  MCV 97.4 98.0  PLT 157 916*   Basic Metabolic Panel Recent Labs    01/23/21 0425 01/23/21 1600 01/09/2021 0338  NA 133* 135 133*  K 4.6 4.7 4.7  CL 99 100 101  CO2 _0 GLUCOSE 177* 182* 162*  BUN _1 CREATININE 1.51* 1.51* 1.38*  CALCIUM 8.6* 8.5* 8.4*  MG 2.4  --  2.4  PHOS 2.6 2.7 2.3*   Liver Function Tests Recent Labs    01/23/21 1600 01/23/2021 0338  ALBUMIN 3.4* 3.3*   No results for input(s): LIPASE, AMYLASE in the last 72 hours. Cardiac Enzymes No results for input(s): CKTOTAL, CKMB, CKMBINDEX, TROPONINI in the last 72 hours.  BNP: BNP (last 3 results) Recent Labs    12/17/20 0022 12/25/2020 1904 01/22/21 1106  BNP 574.3* 1,681.6* 1,143.9*    ProBNP (last 3 results) No results for input(s): PROBNP in the last 8760 hours.   D-Dimer No results for input(s): DDIMER in the last 72 hours. Hemoglobin A1C No results for input(s): HGBA1C in the last 72 hours. Fasting Lipid Panel No results for input(s): CHOL, HDL, LDLCALC, TRIG, CHOLHDL, LDLDIRECT in the last 72 hours. Thyroid Function Tests No results for input(s): TSH, T4TOTAL, T3FREE, THYROIDAB in the last 72 hours.  Invalid input(s): FREET3  Other results:  Imaging    DG Abd 1 View  Result Date: 01/23/2021 CLINICAL DATA:  Constipation EXAM: ABDOMEN - 1 VIEW COMPARISON:  CT abdomen and pelvis April 17, 2020. FINDINGS: There is moderate stool in the colon. There is mild dilatation in the proximal transverse colon region. No appreciable small bowel dilatation. No air-fluid levels. No free air or portal venous air. There are surgical clips in the gallbladder fossa region. There is aortic atherosclerotic calcification. IMPRESSION: Mild dilatation in the proximal transverse colon without air-fluid levels. No other bowel dilatation. Question a degree of enteritis or early ileus. Bowel obstruction not felt to be likely.  No free air. Clips in right upper quadrant. Aortic Atherosclerosis (ICD10-I70.0). Electronically Signed   By: Lowella Grip III M.D.   On: 01/23/2021 14:24     Medications:     Scheduled Medications: . [MAR Hold] B-complex with vitamin C  1 tablet Oral Daily  . [MAR Hold] Chlorhexidine Gluconate Cloth  6 each Topical Daily  . [MAR Hold] feeding supplement  237 mL Oral TID BM  . [MAR Hold] fluticasone furoate-vilanterol  1 puff Inhalation Daily   And  . [MAR Hold] umeclidinium bromide  1 puff Inhalation Daily  . [MAR Hold] insulin aspart  0-5 Units Subcutaneous QHS  . [MAR Hold] insulin aspart  0-9 Units Subcutaneous TID WC  . [MAR Hold] levothyroxine  88 mcg Oral Q0600  . [MAR Hold] midodrine  15 mg Oral TID WC  . [MAR Hold] pantoprazole  40 mg Oral BID  . [MAR Hold] QUEtiapine  12.5 mg Oral QHS  . [MAR Hold] rosuvastatin  10 mg Oral Daily  . [MAR Hold] sodium chloride flush  10-40 mL Intracatheter Q12H  . [MAR Hold] sodium chloride flush  3 mL Intravenous Q12H  . [MAR Hold] sodium chloride flush  3 mL Intravenous Q12H    Infusions: .  prismasol BGK 4/2.5 500 mL/hr at 01/23/21 1305  .  prismasol BGK 4/2.5 300 mL/hr at 01/06/2021 0445  . [MAR Hold] sodium chloride    . sodium chloride 20 mL/hr at 12/31/2020 0600  . amiodarone 30 mg/hr (01/11/2021 0600)  . heparin 1,300 Units/hr (12/30/2020 0600)  . [MAR Hold] norepinephrine (LEVOPHED) Adult infusion Stopped (01/01/2021 0818)  . prismasol BGK 4/2.5 1,500 mL/hr at 01/23/21 1700    PRN Medications: [MAR Hold] sodium chloride, [MAR Hold] acetaminophen **OR** [MAR Hold] acetaminophen, [MAR Hold] albuterol, [MAR Hold] ALPRAZolam, [MAR Hold] azelastine, [MAR Hold] dextromethorphan-guaiFENesin, [MAR Hold] docusate sodium, [MAR Hold] fentaNYL (SUBLIMAZE) injection, [MAR Hold] Glycerin (Adult), [MAR Hold] heparin, [MAR Hold] Muscle Rub, [MAR Hold] ondansetron **OR** [MAR Hold] ondansetron (ZOFRAN) IV, [MAR Hold] polyethylene glycol, [MAR Hold]  sodium chloride flush, [MAR Hold] sodium chloride flush, [MAR Hold] traMADol  Assessment/Plan   1. Acute on chronic systolic CHF: Cause of cardiomyopathy uncertain.  Patient has history of 1-vessel CAD with 75% OM3 stenosis on 11/17 cath.  Cannot rule out tachy-mediated CMP.  Echo this admission with EF 25-30%, severe LVH, moderately decreased RV systolic function, mild-moderate MR, stable TAVR valve with mean gradient 12 mmHg, IVC dilated. RHC with elevated RA pressure and PA pressure, unable to get PCWP, CI 2.2.  TEE 3/31 with EF 25-30%, diffuse hypokinesis with septal-lateral dyssynchrony, moderate RV dysfunction, moderate TR, moderate MR, bioprosthetic aortic valve s/p TAVR with mean gradient 15 mmHg and mild peri-valvular leakage. Diuresis attempted but progressed poorly on Lasix gtt 20 mg/hr + metolazone 5 bid with rising creatinine.  CVVH started 3/27.  I/Os negative over the  last day but CVVH has been held for DCCV in endoscopy.  CVP still appears high and IVC dilated on echo.  Co-ox 74%.  - Wean NE off as able, only on low dose now.  - Think she is still volume overloaded, I think we should continue CVVH for 1 more day.  - Has wide LBBB, may be CRT candidate down the road but would need significant stabilization.  2. CAD: LHC in 11/17 with 75% OM3.  Not candidate for coronary angiography at this point with AKI. No chest pain.   - Continue statin.  3. Atrial fibrillation: History of PAF, admitted here with AF/RVR, HR in 110s generally.  This likely contributes to CHF exacerbation. Has not been on anticoagulation due to history of recurrent GI bleeding. Now back in NSR with DCCV on 3/31.  - Continue amiodarone 30 mg/hr.  - Now on heparin gtt.  Suspect she will be able to tolerate apixaban for at least a month or so post-DCCV, no overt bleeding this admission.  4. AKI on CKD stage IIIb: Creatinine up to 3.58 with poor diuresis.  Suspect cardiorenal syndrome.  Poor response to high dose diuretics.   Suspect she is a  poor candidate for long-term HD with advanced HF, but reasonable to try short-term CVVH to see if she can stabilize with volume removal acutely.  - Nephrology following, CVVH as above. 5. H/o TAVR: Bioprosthetic AoV stable on 3/31 TEE with mild PVL and mean gradient 15 mmHg.  6. COPD, OHS: On home oxygen at baseline.  7. Deconditioning: PT/OT following.   She is having difficulty with CVVH, currently on low dose NE to maintain MAP.  If she is not able to recover and come off RRT with fluid removal and DCCV, I think that she would have a very hard time with iHD due to advanced HF.  Palliative care has seen, will need ongoing discussions => would not want iHD per her daughter.   CRITICAL CARE Performed by: [name]  Total critical care time: 40 minutes  Critical care time was exclusive of separately billable procedures and treating other patients.  Critical care was necessary to treat or prevent imminent or life-threatening deterioration.  Critical care was time spent personally by me on the following activities: development of treatment plan with patient and/or surrogate as well as nursing, discussions with consultants, evaluation of patient's response to treatment, examination of patient, obtaining history from patient or surrogate, ordering and performing treatments and interventions, ordering and review of laboratory studies, ordering and review of radiographic studies, pulse oximetry and re-evaluation of patient's condition.   Length of Stay: 8  [name], MD  12/28/2020, 8:36 AM  Advanced Heart Failure Team Pager [phone] (M-F; 7a - 5p)  Please contact [facility] for night-coverage after hours (5p -7a ) and weekends on [email or web address]

## 2021-01-24 NOTE — Plan of Care (Signed)

## 2021-01-24 NOTE — Anesthesia Postprocedure Evaluation (Signed)
Anesthesia Post Note  Patient: OLUWASEYI TULL  Procedure(s) Performed: TRANSESOPHAGEAL ECHOCARDIOGRAM (TEE) (N/A ) CARDIOVERSION (N/A )     Patient location during evaluation: PACU Anesthesia Type: General Level of consciousness: awake and alert and oriented Pain management: pain level controlled Vital Signs Assessment: post-procedure vital signs reviewed and stable Respiratory status: spontaneous breathing, nonlabored ventilation, respiratory function stable and patient connected to nasal cannula oxygen Cardiovascular status: blood pressure returned to baseline and stable Postop Assessment: no apparent nausea or vomiting Anesthetic complications: no   No complications documented.  Last Vitals:  Vitals:   12/30/2020 0837 01/08/2021 0840  BP: (!) 141/46 (!) 141/46  Pulse: (!) 51 (!) 51  Resp: 16 15  Temp:    SpO2: 100% 100%    Last Pain:  Vitals:   01/12/2021 0832  TempSrc: Temporal  PainSc:                  Cristoval Teall A.

## 2021-01-24 NOTE — Interval H&P Note (Signed)
History and Physical Interval Note:  01/12/2021 8:02 AM  Patricia [name]  has presented today for surgery, with the diagnosis of AFIB.  The various methods of treatment have been discussed with the patient and family. After consideration of risks, benefits and other options for treatment, the patient has consented to  Procedure(s): TRANSESOPHAGEAL ECHOCARDIOGRAM (TEE) (N/A) CARDIOVERSION (N/A) as a surgical intervention.  The patient's history has been reviewed, patient examined, no change in status, stable for surgery.  I have reviewed the patient's chart and labs.  Questions were answered to the patient's satisfaction.     Khole Arterburn [employer]

## 2021-01-24 NOTE — Progress Notes (Signed)
eLink Physician-Brief Progress Note Patient Name: Patricia [name] DOB: 1936-02-07 MRN: [id number]   Date of Service  12/27/2020  HPI/Events of Note  Hypophosphatemia - PO4--- = 1.8 and K+ = 4.8. Patient on CRRT.  eICU Interventions  Pharmacy to enter order for Sodium Phosphate replacement.     Intervention Category Major Interventions: Electrolyte abnormality - evaluation and management  Jonita Hirota [place] 12/29/2020, 10:50 PM

## 2021-01-24 NOTE — CV Procedure (Signed)
Procedure: TEE  Sedation: Per anesthesiology  Indication: Atrial fibrillation  Findings: Please see echo section for full report.  Normal left ventricular size with mild LV dilation.  EF 25-30% with diffuse hypokinesis and septal-lateral dyssynchrony.  Mildly dilated RV with moderately decreased systolic function.  Moderate left atrial enlargement, no LA appendage thrombus.  Moderate right atrial enlargement.  There was a small PFO by color doppler.  Moderate tricuspid regurgitation, peak RV-RA gradient 39 mmHg.  Moderate mitral regurgitation with heavily calcified mitral annulus.  Mild mitral stenosis with mean gradient 4 mmHg.  There was a bioprosthetic aortic valve s/p TAVR.  There was mild peri-valvular leakage.  Mean gradient across the aortic valve 15 mmHg.  Normal caliber thoracic aorta with mild plaque.  IVC remains dilated.   Impression: No LA appendage thrombus, ok for DCCV.  Patricia [name] 01/23/2021 8:24 AM

## 2021-01-24 NOTE — Progress Notes (Signed)
[place] for heparin  Indication: atrial fibrillation  Allergies  Allergen Reactions  . Codeine Other (See Comments)    "Spaces out" the patient and she cannot walk well  . Vesicare [Solifenacin] Itching    Patient Measurements: Height: _0  (157.5 cm) Weight: 94.2 kg (207 lb 10.8 oz) IBW/kg (Calculated) : 50.1 HEPARIN DW (KG): 74.7   Vital Signs: Temp: 97.7 F (36.5 C) (03/31 0832) Temp Source: Temporal (03/31 0832) BP: 113/102 (03/31 1045) Pulse Rate: 51 (03/31 1045)  Labs: Recent Labs    01/22/21 0448 01/22/21 0449 01/22/21 1600 01/23/21 0425 01/23/21 1600 12/29/2020 0338  HGB 9.8*  --   --  10.1*  --  10.2*  HCT 31.9*  --   --  34.3*  --  33.9*  PLT 162  --   --  157  --  140*  APTT 61*  --   --  65*  --  75*  LABPROT  --   --   --   --   --  16.1*  INR  --   --   --   --   --  1.3*  HEPARINUNFRC  --  0.71*  --  0.56  --  0.56  CREATININE 1.61*  --    < > 1.51* 1.51* 1.38*   < > = values in this interval not displayed.    Estimated Creatinine Clearance: 33.6 mL/min (A) (by C-G formula based on SCr of 1.38 mg/dL (H)).   Medical History: Past Medical History:  Diagnosis Date  . Allergy   . Anemia, unspecified   . Arthritis   . Asthma   . Atrial fibrillation status post cardioversion [facility]) 09/2015   s/p TEE/DCCV>>SR on amio  . Benign neoplasm of colon   . Carpal tunnel syndrome   . Cellulitis and abscess of finger, unspecified   . Cerumen impaction   . Cervicalgia   . CHF (congestive heart failure) ([place])   . Chronic airway obstruction, not elsewhere classified   . Chronic anticoagulation 09/2015   Eliquis  . Diarrhea   . Diffuse cystic mastopathy   . Edema   . Encounter for long-term (current) use of other medications   . GERD (gastroesophageal reflux disease)   . Heart murmur   . Lumbago   . Neck pain 05/23/2015  . Obesity, unspecified   . Other and unspecified hyperlipidemia   . Other psoriasis   .  Pain in joint, lower leg   . PONV (postoperative nausea and vomiting)   . Primary localized osteoarthrosis of right shoulder 12/18/2015  . 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 Scoliosis (and kyphoscoliosis), idiopathic   . Shortness of breath dyspnea    with exertion  . Type II or unspecified type diabetes mellitus without mention of complication, uncontrolled   . Unspecified essential hypertension   . Unspecified hemorrhoids without mention of complication   . Unspecified hypothyroidism   . Urge incontinence     Medications:  Medications Prior to Admission  Medication Sig Dispense Refill Last Dose  . acetaminophen (TYLENOL) 650 MG CR tablet Take 1,300 mg by mouth every 8 (eight) hours as needed for pain.   Past Month at Unknown time  . albuterol (PROVENTIL) (2.5 MG/3ML) 0.083% nebulizer solution USE 1 VIAL IN NEBULIZER EVERY 4 HOURS AND AS NEEDED. Generic: VENTOLIN (Patient taking differently: Take 2.5  mg by nebulization every 4 (four) hours as needed for shortness of breath or wheezing.) 150 mL 3 Past Month at Unknown time  . albuterol (VENTOLIN HFA) 108 (90 Base) MCG/ACT inhaler INHALE 2 PUFFS INTO THE LUNGS EVERY 6 HOURS AS NEEDED FOR WHEEZING OR SHORTNESS OF BREATH (Patient taking differently: Inhale 2 puffs into the lungs every 6 (six) hours as needed for shortness of breath or wheezing.) 6.7 g 3 Past Month at Unknown time  . amiodarone (PACERONE) 200 MG tablet Take 1 tablet (200 mg total) by mouth daily. 30 tablet 11 01/06/2021 at Unknown time  . azelastine (ASTELIN) 0.1 % nasal spray Place 1 spray into both nostrils 2 (two) times daily. Use in each nostril as directed (Patient taking differently: Place 1 spray into both nostrils 2 (two) times daily as needed. Use in each nostril as directed) 30 mL 1 Past Month at Unknown time  . Blood Glucose Monitoring Suppl  (ONETOUCH VERIO) w/Device KIT Check blood sugar twice daily DX:E11.9 1 kit 0   . cetirizine (ZYRTEC) 10 MG tablet Take 10 mg by mouth daily as needed for allergies.    unk  . colestipol (COLESTID) 1 g tablet Take 1 g by mouth 2 (two) times daily.   12/28/2020 at Unknown time  . Dextromethorphan-guaiFENesin (MUCINEX DM) 30-600 MG TB12 Take 2 tablets by mouth as needed (cough).   Past Month at Unknown time  . doxazosin (CARDURA) 4 MG tablet TAKE 1 TABLET BY MOUTH EVERY DAY TO HELP CONTROL BLOOD PRESSURE (Patient taking differently: Take 4 mg by mouth daily.) 90 tablet 1 01/08/2021 at Unknown time  . ferrous sulfate 325 (65 FE) MG EC tablet Take 325 mg by mouth 2 (two) times daily.   12/25/2020 at Unknown time  . furosemide (LASIX) 40 MG tablet TAKE 2 TABLETS BY MOUTH DAILY AND 1 TABLET BY MOUTH AT LUNCH on  Mondays, Wednesdays and Fridays (Patient taking differently: Take 40-80 mg by mouth See admin instructions. 2 tablets in the morning  daily. Take 1 tablet at lunch on m,w,f in addition to morning dose) 270 tablet 1 01/17/2021 at Unknown time  . glimepiride (AMARYL) 1 MG tablet TAKE 1 TABLET(1 MG) BY MOUTH DAILY WITH BREAKFAST (Patient taking differently: Take 1 mg by mouth daily with breakfast.) 90 tablet 1 01/06/2021 at Unknown time  . glucose blood test strip 1 each by Other route 2 (two) times daily. OneTouch Verio DX E11.8 200 each 11   . JANUVIA 50 MG tablet TAKE 1 TABLET(50 MG) BY MOUTH DAILY (Patient taking differently: Take 50 mg by mouth daily.) 30 tablet 6 12/25/2020 at Unknown time  . Lancets (ONETOUCH ULTRASOFT) lancets 1 each by Other route 2 (two) times daily. OneTouch Verio DX E11.9 200 each 11   . levothyroxine (SYNTHROID) 88 MCG tablet TAKE 1 TABLET BY MOUTH DAILY ON AN EMPTY STOMACH (Patient taking differently: Take 88 mcg by mouth daily before breakfast.) 90 tablet 1 12/30/2020 at Unknown time  . OVER THE COUNTER MEDICATION Place 1 drop into both eyes daily. [facility] brand lubricating eye  drop   01/22/2021 at Unknown time  . OXYGEN Inhale 2 L into the lungs continuous. Pt also uses 4L pulse via POC     . pantoprazole (PROTONIX) 40 MG tablet TAKE 1 TABLET(40 MG) BY MOUTH TWICE DAILY (Patient taking differently: Take 40 mg by mouth 2 (two) times daily.) 180 tablet 1 01/03/2021 at Unknown time  . potassium gluconate 595 (99 K) MG TABS tablet Take 595  mg by mouth 2 (two) times daily.   01/01/2021 at Unknown time  . predniSONE (DELTASONE) 10 MG tablet Take 4 tabs po daily x 3 days; then 3 tabs daily x3 days; then 2 tabs daily x3 days; then 1 tab daily x 3 days; then stop 30 tablet 0 01/16/2021 at Unknown time  . rosuvastatin (CRESTOR) 10 MG tablet TAKE 1 TABLET(10 MG) BY MOUTH DAILY (Patient taking differently: Take 10 mg by mouth daily.) 90 tablet 0 01/04/2021 at Unknown time  . TRELEGY ELLIPTA 200-62.5-25 MCG/INH AEPB INHALE 1 PUFF INTO THE LUNGS DAILY 60 each 6 01/09/2021 at Unknown time  . cholecalciferol (VITAMIN D) 1000 units tablet Take 1,000 Units by mouth at bedtime.    01/14/2021   Scheduled:  . amiodarone  200 mg Oral Daily  . B-complex with vitamin C  1 tablet Oral Daily  . Chlorhexidine Gluconate Cloth  6 each Topical Daily  . feeding supplement  237 mL Oral TID BM  . fluticasone furoate-vilanterol  1 puff Inhalation Daily   And  . umeclidinium bromide  1 puff Inhalation Daily  . insulin aspart  0-5 Units Subcutaneous QHS  . insulin aspart  0-9 Units Subcutaneous TID WC  . levothyroxine  88 mcg Oral Q0600  . midodrine  15 mg Oral TID WC  . pantoprazole  40 mg Oral BID  . QUEtiapine  12.5 mg Oral QHS  . rosuvastatin  10 mg Oral Daily  . sodium chloride flush  10-40 mL Intracatheter Q12H  . sodium chloride flush  3 mL Intravenous Q12H  . sodium chloride flush  3 mL Intravenous Q12H    Assessment: 85 yo female with HF and AKI noted on CRRT. She was on apixaban (last dose 3/26 at ~ 6pm) and pharmacy was consulted to start heparin.   HL therapeutic today at 0.56. No issues  with heparin gtt noted. CBC remains stable.  Goal of Therapy:  Heparin level 0.3-0.7 units/ml Monitor platelets by anticoagulation protocol: Yes   Plan:  Continue IV heparin at 1,300 units/hr Follow daily HL, CBC, s/sx bleeding  Mercy Riding, PharmD PGY1 Acute Care Pharmacy Resident Please refer to [facility] for unit-specific pharmacist

## 2021-01-24 NOTE — Transfer of Care (Signed)
Immediate Anesthesia Transfer of Care Note  Patient: Patricia [name]  Procedure(s) Performed: TRANSESOPHAGEAL ECHOCARDIOGRAM (TEE) (N/A ) CARDIOVERSION (N/A )  Patient Location: Endoscopy Unit  Anesthesia Type:General  Level of Consciousness: drowsy  Airway & Oxygen Therapy: Patient Spontanous Breathing and Patient connected to face mask oxygen  Post-op Assessment: Report given to RN and Post -op Vital signs reviewed and stable  Post vital signs: Reviewed and stable  Last Vitals:  Vitals Value Taken Time  BP    Temp    Pulse    Resp    SpO2      Last Pain:  Vitals:   01/19/2021 0736  TempSrc: Temporal  PainSc: 0-No pain      Patients Stated Pain Goal: 0 (47/42/59 5638)  Complications: No complications documented.

## 2021-01-24 NOTE — Progress Notes (Signed)
[facility] Progress Note    Assessment/ Plan:   AKI on CKD 4 -Baseline creatinine seems to range around 1.6-2 with underlying proteinuria.  Suspect is a component of diabetic kidney disease and hypertensive arterionephrosclerosis -AKI likely related to cardiorenal syndrome -Initiated CRRT 3/27 via temp cath in setting of worsening renal function and volume overload refractory to max medical therapies; running ok after addition of heparin -Off lasix gtt given lack of response, UFR net neg 180m/hr which I think is appropriate at this time -Ongoing disucssion re: feasibility and utility of long term RRT in settting of numerous comorbidities; palliative care is aiding in discussion, appreciated.  For now we are hopeful that off loading volume and cardioversion may restore renal perfusion and improve function to liberate from RRT or at least vasopressor support.  Unfortunately she's now anuric.  I discussed these issues with daughter bedside today.  She seems understanding of poor prognosis and has asked to speak with palliative care again.  We tentatively plan to hold CRRT tomorrow.  -Continue to avoid nephrotoxins and dose meds for CRRT  Acute decompensated biventricular failure -Cardio consulting, status post RHC 01/19/2021.  On NE (started after CRRT), off milrinone as of 3/29. CVVHD cont for UF -Palliative consulting  Afib w/ RVR -on amio gtt -s/p CV now in SB, off NE  Acute on chronic hypoxic and hypercapnic respiratory failure, history of OHS, COPD - improving with ultrafiltration  Hypotension, history of hypertension -Likely secondary to poor cardiac output - on  NE  DM2 with hyperglycemia -Management per primary service  Anemia of chronic disease and chronic IDA - transfuse for hemoglobin less than 7, per primary  Subjective:   CRRT continues - I/Os 1.5/3.4, net neg 2L, UOP 059mCardioverted this AM  Off NE now and in SB.  Daughter bedside.     Objective:    BP (!) 129/49   Pulse (!) 47   Temp 97.7 F (36.5 C) (Axillary)   Resp 13   Ht _0  (1.575 m)   Wt 94.2 kg   SpO2 99%   BMI 37.98 kg/m   Intake/Output Summary (Last 24 hours) at 12/31/2020 1159 Last data filed at 01/11/2021 1100 Gross per 24 hour  Intake 1447.6 ml  Output 3178 ml  Net -1730.4 ml   Weight change: 0.4 kg  Physical Exam: Gen:nad, sitting in bed on CRRT  CVS: reg, brady Resp: normal WOB at rest  Abd: Obese, soft, nontender Ext: trace pitting edema bilateral lower extremities - improved c/w yesterday Neuro: Speech and coherent, moves all extremities spontaneously  Imaging: DG Chest 1 View  Result Date: 01/23/2021 CLINICAL DATA:  Follow-up pulmonary edema EXAM: CHEST  1 VIEW COMPARISON:  01/22/2021 FINDINGS: Cardiac shadow remains enlarged. Changes of prior TAVR are seen. Dialysis catheter is again noted from the left jugular approach. Small bilateral pleural effusions are noted right greater than left stable from the prior exam. Persistent vascular congestion and mild edema are noted and stable. No bony abnormality is seen. IMPRESSION: Stable appearance of the chest when compared with the prior exam. Electronically Signed   By: MaInez Catalina.D.   On: 01/23/2021 04:02   DG Abd 1 View  Result Date: 01/23/2021 CLINICAL DATA:  Constipation EXAM: ABDOMEN - 1 VIEW COMPARISON:  CT abdomen and pelvis April 17, 2020. FINDINGS: There is moderate stool in the colon. There is mild dilatation in the proximal transverse colon region. No appreciable small bowel dilatation. No air-fluid levels. No free air  or portal venous air. There are surgical clips in the gallbladder fossa region. There is aortic atherosclerotic calcification. IMPRESSION: Mild dilatation in the proximal transverse colon without air-fluid levels. No other bowel dilatation. Question a degree of enteritis or early ileus. Bowel obstruction not felt to be likely. No free air. Clips in right upper quadrant. Aortic  Atherosclerosis (ICD10-I70.0). Electronically Signed   By: Lowella Grip III M.D.   On: 01/23/2021 14:24    Labs: BMET Recent Labs  Lab 01/21/21 0357 01/21/21 1644 01/22/21 0448 01/22/21 1600 01/23/21 0425 01/23/21 1600 01/05/2021 0338  NA 134* 130* 134* 132* 133* 135 133*  K 3.6 4.0 4.4 4.6 4.6 4.7 4.7  CL 96* 95* 100 97* 99 100 101  CO2 _0 GLUCOSE 172* 223* 205* 216* 177* 182* 162*  BUN 45* 29* _1 CREATININE 2.03* 1.63* 1.61* 1.54* 1.51* 1.51* 1.38*  CALCIUM 8.1* 8.1* 8.3* 8.3* 8.6* 8.5* 8.4*  PHOS 2.9 2.3* 2.6 2.6 2.6 2.7 2.3*   CBC Recent Labs  Lab 01/21/21 0357 01/22/21 0448 01/23/21 0425 01/11/2021 0338  WBC 6.4 8.1 8.0 7.1  HGB 9.3* 9.8* 10.1* 10.2*  HCT 30.0* 31.9* 34.3* 33.9*  MCV 93.8 95.2 97.4 98.0  PLT 144* 162 157 140*    Medications:    . amiodarone  200 mg Oral Daily  . B-complex with vitamin C  1 tablet Oral Daily  . Chlorhexidine Gluconate Cloth  6 each Topical Daily  . feeding supplement  237 mL Oral TID BM  . fluticasone furoate-vilanterol  1 puff Inhalation Daily   And  . umeclidinium bromide  1 puff Inhalation Daily  . insulin aspart  0-5 Units Subcutaneous QHS  . insulin aspart  0-9 Units Subcutaneous TID WC  . levothyroxine  88 mcg Oral Q0600  . midodrine  15 mg Oral TID WC  . pantoprazole  40 mg Oral BID  . QUEtiapine  12.5 mg Oral QHS  . rosuvastatin  10 mg Oral Daily  . sodium chloride flush  10-40 mL Intracatheter Q12H  . sodium chloride flush  3 mL Intravenous Q12H  . sodium chloride flush  3 mL Intravenous Q12H      Jannifer Hick MD [facility] Pager [phone]

## 2021-01-24 NOTE — Progress Notes (Signed)
NAME:  Patricia [name], MRN:  [id number], DOB:  10/15/1936, LOS: 8 ADMISSION DATE:  01/08/2021, CONSULTATION DATE:  12/26/2020 REFERRING MD:  Starla Link - TRH, CHIEF COMPLAINT:  SOB  HPI/course in hospital  85 yo F with a relevant PMH of OSH c/b COPD and hypoxia (baseline home O2 2L) with pulmonary HTN by RHC (3/25), CKD IV, Afib, CAD w/ HFref (3/23 EF: 25-30% vs recent baseline 40-45%), s/p TAVR (09/2016), T2DM, and hx of chronic GI bleed.   On admission, patient described increased SOB on 2L O2 at home, bilateral lower extremity pitting edema, and fluctuating/elevated HR. Describes process as "long time in making". BNP 1681, CXR with bilateral pleural effusions and pulmonary edema. Admitted to [facility] for acute heart failure exacerbation. Troponin trend at that time not c/w ACS. Got 32m lasix in ED, and total of 1482mLasix 3/23, lasix w/ metolazone, without a robust response. ECHO obtained 3/23 which revealed LVEF 25-30%, LVH, reduced RV systolic function but normal RV size. Severely dilated LA. Moderately dilated RA.     3/24 Cr has bumped and lasix being held. Cardiology suggests PCCM consult to evaluate SOB for pulmonary etiology of SOB. 3/26: Transfer to ICU for initiation of CRRT 3/27: CRRT w/ CVHHD 3/28: continue CVVHD for kidney recover and euvolemia  3/29: weight 98.5 >> 95.7 kg, increased inotropic support required ON   3/30: weight 98.5 >> 93.8 kg, appears euvolemic, increased inotropic support required ON   3/31: TEE DCCV successful with sinus rhythm. Bradycardiac. Off NE support.   Past Medical History   Past Medical History:  Diagnosis Date  . Allergy   . Anemia, unspecified   . Arthritis   . Asthma   . [facility]   s/p TEE/DCCV>>SR on amio  . Benign neoplasm of colon   . Carpal tunnel syndrome   . Cellulitis and abscess of finger, unspecified   . Cerumen impaction   . Cervicalgia   . CHF (congestive heart failure) ([place]  .  Chronic airway obstruction, not elsewhere classified   . Chronic anticoagulation 09/2015   Eliquis  . Diarrhea   . Diffuse cystic mastopathy   . Edema   . Encounter for long-term (current) use of other medications   . GERD (gastroesophageal reflux disease)   . Heart murmur   . Lumbago   . Neck pain 05/23/2015  . Obesity, unspecified   . Other and unspecified hyperlipidemia   . Other psoriasis   . Pain in joint, lower leg   . PONV (postoperative nausea and vomiting)   . Primary localized osteoarthrosis of right shoulder 12/18/2015  . 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 Scoliosis (and kyphoscoliosis), idiopathic   . Shortness of breath dyspnea    with exertion  . Type II or unspecified type diabetes mellitus without mention of complication, uncontrolled   . Unspecified essential hypertension   . Unspecified hemorrhoids without mention of complication   . Unspecified hypothyroidism   . Urge incontinence      Past Surgical History:  Procedure Laterality Date  . ABDOMINAL HYSTERECTOMY  1987   complete  . BREAST BIOPSY Right   . BREAST EXCISIONAL BIOPSY Left 2011  . BREAST EXCISIONAL BIOPSY Right    x2  . BREAST SURGERY     right breast 3 surgeries [phone]. CARDIAC CATHETERIZATION N/A 09/02/2016   Procedure: Right/Left  Heart Cath and Coronary Angiography;  Surgeon: [name] M Martinique, MD;  Location: [place] CV LAB;  Service: Cardiovascular;  Laterality: N/A;  . CARDIOVERSION N/A 10/19/2015   Procedure: CARDIOVERSION;  Surgeon: Lelon Perla, MD;  Location: [facility] ENDOSCOPY;  Service: Cardiovascular;  Laterality: N/A;  . CARDIOVERSION N/A 11/13/2017   Procedure: CARDIOVERSION;  Surgeon: Jerline Pain, MD;  Location: [facility] ENDOSCOPY;  Service: Cardiovascular;  Laterality: N/A;  . CARDIOVERSION N/A 07/28/2019   Procedure: CARDIOVERSION;  Surgeon:  Buford Dresser, MD;  Location: [place];  Service: Cardiovascular;  Laterality: N/A;  . [place]  . COLON SURGERY     2004,2007,2013.  3 times colon surgieres  . ESOPHAGOGASTRODUODENOSCOPY (EGD) WITH PROPOFOL Left 03/13/2017   Procedure: ESOPHAGOGASTRODUODENOSCOPY (EGD) WITH PROPOFOL;  Surgeon: Ronnette Juniper, MD;  Location: [place];  Service: Gastroenterology;  Laterality: Left;  . ESOPHAGOGASTRODUODENOSCOPY (EGD) WITH PROPOFOL Left 01/22/2018   Procedure: ESOPHAGOGASTRODUODENOSCOPY (EGD) WITH PROPOFOL;  Surgeon: Arta Silence, MD;  Location: [facility] ENDOSCOPY;  Service: Endoscopy;  Laterality: Left;  . EXCISE LE MANDIBULAR LYMPH NODE T     DR C. NEWMAN  . FOOT SURGERY  1989  . GIVENS CAPSULE STUDY Left 01/23/2018   Procedure: GIVENS CAPSULE STUDY;  Surgeon: Wilford Corner, MD;  Location: [facility] ENDOSCOPY;  Service: Endoscopy;  Laterality: Left;  . LEFT SHOULDER ARTHROSCOPY    . 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 NEUROPLASTY / TRANSPOSITION MEDIAN NERVE AT CARPAL TUNNEL BILATERAL  05/2003  . RIGHT HEART CATH N/A 12/29/2020   Procedure: RIGHT HEART CATH;  Surgeon: Leonie Man, MD;  Location: [place] CV LAB;  Service: Cardiovascular;  Laterality: N/A;  . TEE WITHOUT CARDIOVERSION N/A 10/19/2015   Procedure: Transesophageal Echocardiogram (TEE) ;  Surgeon: Lelon Perla, MD;  Location: [facility] ENDOSCOPY;  Service: Cardiovascular;  Laterality: N/A;  . TEE WITHOUT CARDIOVERSION N/A 10/14/2016   Procedure: TRANSESOPHAGEAL ECHOCARDIOGRAM (TEE);  Surgeon: Sherren Mocha, MD;  Location: [place];  Service: Open Heart Surgery;  Laterality: N/A;  . TEE WITHOUT CARDIOVERSION N/A 07/28/2019   Procedure: TRANSESOPHAGEAL ECHOCARDIOGRAM (TEE);  Surgeon: Buford Dresser, MD;  Location: [facility] ENDOSCOPY;  Service: Cardiovascular;  Laterality: N/A;  . TOTAL SHOULDER ARTHROPLASTY Right 12/18/2015   Procedure: TOTAL SHOULDER ARTHROPLASTY;  Surgeon: [name], MD;   Location: [place];  Service: Orthopedics;  Laterality: Right;  . TRANSCATHETER AORTIC VALVE REPLACEMENT, TRANSFEMORAL N/A 10/14/2016   Procedure: TRANSCATHETER AORTIC VALVE REPLACEMENT, TRANSFEMORAL;  Surgeon: Sherren Mocha, MD;  Location: [place];  Service: Open Heart Surgery;  Laterality: N/A;  . TRIGGER FINGER RELEASE Left   . VAGINAL CYST REMOVED  1967     Review of Systems:   ROS  Social History   reports that she quit smoking about 31 years ago. She has a 40.00 pack-year smoking history. She has never used smokeless tobacco. She reports that she does not drink alcohol and does not use drugs.   Family History   Her family history includes Arthritis in her sister; Asthma in her sister; Diabetes in her father; Heart disease in her brother and father; Liver cancer in her sister; Stroke in her father. There is no history of Breast cancer.   Allergies Allergies  Allergen Reactions  . Codeine Other (See Comments)    "Spaces out" the patient and she cannot walk well  . Vesicare [Solifenacin] Itching     Home Medications  Prior to Admission medications   Medication Sig Start Date End Date Taking? Authorizing Provider  acetaminophen (TYLENOL) 650 MG CR tablet Take 1,300 mg by mouth every 8 (eight) hours as needed for pain.   Yes [provider]  albuterol (PROVENTIL) (2.5 MG/3ML) 0.083% nebulizer solution USE 1 VIAL IN NEBULIZER EVERY 4 HOURS AND AS NEEDED. Generic: VENTOLIN Patient taking differently: Take 2.5 mg by nebulization every 4 (four) hours as needed for shortness of breath or wheezing. 07/12/20  Yes Brand Males, MD  albuterol (VENTOLIN HFA) 108 (90 Base) MCG/ACT inhaler INHALE 2 PUFFS INTO THE LUNGS EVERY 6 HOURS AS NEEDED FOR WHEEZING OR SHORTNESS OF BREATH Patient taking differently: Inhale 2 puffs into the lungs every 6 (six) hours as needed for shortness of breath or wheezing. 10/10/20  Yes [name], NP  amiodarone (PACERONE) 200 MG tablet Take 1 tablet  (200 mg total) by mouth daily. 06/29/20  Yes Martinique, [name] M, MD  azelastine (ASTELIN) 0.1 % nasal spray Place 1 spray into both nostrils 2 (two) times daily. Use in each nostril as directed Patient taking differently: Place 1 spray into both nostrils 2 (two) times daily as needed. Use in each nostril as directed 03/02/20  Yes Martyn Ehrich, NP  Blood Glucose Monitoring Suppl [facility] VERIO) w/Device KIT Check blood sugar twice daily DX:E11.9 10/12/19  Yes [name], NP  cetirizine (ZYRTEC) 10 MG tablet Take 10 mg by mouth daily as needed for allergies.    Yes [provider]  colestipol (COLESTID) 1 g tablet Take 1 g by mouth 2 (two) times daily. 06/29/20  Yes [provider]  Dextromethorphan-guaiFENesin (MUCINEX DM) 30-600 MG TB12 Take 2 tablets by mouth as needed (cough).   Yes [provider]  doxazosin (CARDURA) 4 MG tablet TAKE 1 TABLET BY MOUTH EVERY DAY TO HELP CONTROL BLOOD PRESSURE Patient taking differently: Take 4 mg by mouth daily. 09/12/20  Yes [name], NP  ferrous sulfate 325 (65 FE) MG EC tablet Take 325 mg by mouth 2 (two) times daily.   Yes [provider]  furosemide (LASIX) 40 MG tablet TAKE 2 TABLETS BY MOUTH DAILY AND 1 TABLET BY MOUTH AT LUNCH on  Mondays, Wednesdays and Fridays Patient taking differently: Take 40-80 mg by mouth See admin instructions. 2 tablets in the morning  daily. Take 1 tablet at lunch on m,w,f in addition to morning dose 09/17/20  Yes Eubanks, Carlos American, NP  glimepiride (AMARYL) 1 MG tablet TAKE 1 TABLET(1 MG) BY MOUTH DAILY WITH BREAKFAST Patient taking differently: Take 1 mg by mouth daily with breakfast. 11/19/20  Yes [name], NP  glucose blood test strip 1 each by Other route 2 (two) times daily. OneTouch Verio DX E11.8 10/12/19  Yes Eubanks, Carlos American, NP  JANUVIA 50 MG tablet TAKE 1 TABLET(50 MG) BY MOUTH DAILY Patient taking differently: Take 50 mg by mouth daily. 10/10/20  Yes  [name], NP  Lancets [facility] ULTRASOFT) lancets 1 each by Other route 2 (two) times daily. OneTouch Verio DX E11.9 10/12/19  Yes [name], NP  levothyroxine (SYNTHROID) 88 MCG tablet TAKE 1 TABLET BY MOUTH DAILY ON AN EMPTY STOMACH Patient taking differently: Take 88 mcg by mouth daily before breakfast. 08/29/20  Yes Eubanks, Carlos American, NP  OVER THE COUNTER MEDICATION Place 1 drop into both eyes daily. [facility] brand lubricating eye drop   Yes [provider]  OXYGEN Inhale 2 L into the lungs continuous. Pt also uses 4L pulse via POC   Yes [provider]  pantoprazole (  PROTONIX) 40 MG tablet TAKE 1 TABLET(40 MG) BY MOUTH TWICE DAILY Patient taking differently: Take 40 mg by mouth 2 (two) times daily. 07/11/20  Yes [name], NP  potassium gluconate 595 (99 K) MG TABS tablet Take 595 mg by mouth 2 (two) times daily.   Yes [provider]  predniSONE (DELTASONE) 10 MG tablet Take 4 tabs po daily x 3 days; then 3 tabs daily x3 days; then 2 tabs daily x3 days; then 1 tab daily x 3 days; then stop 01/14/21  Yes Martyn Ehrich, NP  rosuvastatin (CRESTOR) 10 MG tablet TAKE 1 TABLET(10 MG) BY MOUTH DAILY Patient taking differently: Take 10 mg by mouth daily. 10/31/20  Yes [name], NP  TRELEGY ELLIPTA 200-62.5-25 MCG/INH AEPB INHALE 1 PUFF INTO THE LUNGS DAILY 10/15/20  Yes Martyn Ehrich, NP  cholecalciferol (VITAMIN D) 1000 units tablet Take 1,000 Units by mouth at bedtime.     [provider]     Interim history/subjective:  Patient very tired this morning following cardioconversion. Daughter at bedside reports that patient had suppository last night but still has not had a BM (since 3/25). Nurse at bedside with no new concerns.   Objective   Blood pressure (!) 129/49, pulse (!) 47, temperature 97.7 F (36.5 C), temperature source Temporal, resp. rate 13, height _0  (1.575 m), weight 94.2 kg, SpO2 99 %. CVP:  [15  mmHg-19 mmHg] 16 mmHg      Intake/Output Summary (Last 24 hours) at 01/01/2021 1122 Last data filed at 01/14/2021 1100 Gross per 24 hour  Intake 1447.6 ml  Output 3019 ml  Net -1571.4 ml   Filed Weights   01/22/21 0500 01/23/21 0500 01/02/2021 0500  Weight: 95.7 kg 93.8 kg 94.2 kg    Examination: Gen: NAD, drowsy, Ogle in place, evidence of deconditioning and weakness  HEENT: EOMI, MMM, anicteric sclera   CV: bradycardiac S1/S2, no m/r/g, JVD trace / hard to appreciate (approx 8 cm) Pulm: CTAB in upper fields, moving air appropriately  GI: soft, NTND   Neuro: Moves extremities at least against gravity - not formally assessed  Skin: LIJ HD cath in place w/out e/d/i    Ancillary tests (personally reviewed)  CBC: Recent Labs  Lab 01/20/21 0414 01/21/21 0357 01/22/21 0448 01/23/21 0425 01/19/2021 0338  WBC 6.5 6.4 8.1 8.0 7.1  HGB 9.0* 9.3* 9.8* 10.1* 10.2*  HCT 28.2* 30.0* 31.9* 34.3* 33.9*  MCV 91.9 93.8 95.2 97.4 98.0  PLT 126* 144* 162 157 140*    Basic Metabolic Panel: Recent Labs  Lab 01/20/21 0414 01/20/21 1638 01/21/21 0357 01/21/21 1644 01/22/21 0448 01/22/21 1600 01/23/21 0425 01/23/21 1600 01/08/2021 0338  NA 133*   < > 134*   < > 134* 132* 133* 135 133*  K 3.5   < > 3.6   < > 4.4 4.6 4.6 4.7 4.7  CL 92*   < > 96*   < > 100 97* 99 100 101  CO2 27   < > 29   < > _1 GLUCOSE 203*   < > 172*   < > 205* 216* 177* 182* 162*  BUN 98*   < > 45*   < > _2 CREATININE 3.54*   < > 2.03*   < > 1.61* 1.54* 1.51* 1.51* 1.38*  CALCIUM 8.2*   < > 8.1*   < > 8.3* 8.3* 8.6* 8.5* 8.4*  MG 1.9  --  2.1  --  2.4  --  2.4  --  2.4  PHOS  --    < > 2.9   < > 2.6 2.6 2.6 2.7 2.3*   < > = values in this interval not displayed.   GFR: Estimated Creatinine Clearance: 33.6 mL/min (A) (by C-G formula based on SCr of 1.38 mg/dL (H)). Recent Labs  Lab 01/21/21 0357 01/22/21 0448 01/23/21 0425 01/16/2021 0338  WBC 6.4 8.1 8.0 7.1    Liver Function  Tests: Recent Labs  Lab 01/22/21 0448 01/22/21 1600 01/23/21 0425 01/23/21 1600 12/31/2020 0338  ALBUMIN 3.4* 3.4* 3.4* 3.4* 3.3*   No results for input(s): LIPASE, AMYLASE in the last 168 hours. No results for input(s): AMMONIA in the last 168 hours.  ABG    Component Value Date/Time   PHART 7.366 06/12/2020 1018   PCO2ART 57.9 (H) 06/12/2020 1018   PO2ART 84.9 06/12/2020 1018   HCO3 35.3 (H) 01/02/2021 1543   HCO3 34.5 (H) 12/25/2020 1543   TCO2 37 (H) 12/30/2020 1543   TCO2 37 (H) 01/04/2021 1543   O2SAT 73.5 01/11/2021 0338     Coagulation Profile: Recent Labs  Lab 01/02/2021 0338  INR 1.3*    Cardiac Enzymes: No results for input(s): CKTOTAL, CKMB, CKMBINDEX, TROPONINI in the last 168 hours.  HbA1C: Hgb A1c MFr Bld  Date/Time Value Ref Range Status  01/16/2021 04:37 AM 7.1 (H) 4.8 - 5.6 % Final    Comment:    (NOTE) Pre diabetes:          5.7%-6.4%  Diabetes:              >6.4%  Glycemic control for   <7.0% adults with diabetes   09/17/2020 12:00 AM 7.3 (H) <5.7 % of total Hgb Final    Comment:    For someone without known diabetes, a hemoglobin A1c value of 6.5% or greater indicates that they may have  diabetes and this should be confirmed with a follow-up  test. . For someone with known diabetes, a value <7% indicates  that their diabetes is well controlled and a value  greater than or equal to 7% indicates suboptimal  control. A1c targets should be individualized based on  duration of diabetes, age, comorbid conditions, and  other considerations. . Currently, no consensus exists regarding use of hemoglobin A1c for diagnosis of diabetes for children. .     CBG: Recent Labs  Lab 01/23/21 0658 01/23/21 1131 01/23/21 1616 01/23/21 2126 12/30/2020 0631  GLUCAP 159* 191* 180* 166* 155*     Assessment & Plan:  3 yoF remains in guarded and moderately ill condition with a relevant PMH of OSH c/b COPD and hypoxia (baseline home O2 2L) with  pulmonary HTN by RHC (3/25), CKD IV, Afib, CAD w/ HFref (3/23 EF: 25-30% vs recent baseline 40-45%), s/p TAVR (09/2016), T2DM, and hx of chronic GI bleed. CVP remains elevated with hope that NSR will help kidney perfusion and diuresis. Will remain on CRRT today with goal of Lasix and determination if we can wean dialysis tomorrow. Bradycardiac but HDS following cardioconversion. Still has not had BM.   AKI on CKD IV Anuric AKI likely multifactorial etiology with components of prerenal and cardiorenal syndrome given decompensated HFrEF c/b Afib w/ RVR and microvascular disease (T2DM).  - Continue CRRT w/ CVVHD       - Goal to wean tomorrow  - CVP remains elevated - Maintain MAP >70 for optimal renal perfusion - Continue to trend  renal function; consider svO2 PRN      - Most recent Coox remains w/in goal range  - Avoid further nephrotoxins including NSAIDS, Morphine. Unless absolutely necessary, avoid CT with contrast and/or MRI with gadolinium.    - Currently no indication for HD. However, not a good candidate long term iHD.  - Nephrology following, appreciate recommendations   Constipation   Given lack of BM since 3/25 with mild, diffuse tenderness with flatus and active bowel sounds, patient may be experiencing an early partial SBO. No BM following suppository last night. Will CTM. May improve with NSR and d/c of amio. - Miralax PRN  - Glycerin suppository PRN   - KUB revealed diffuse gas pattern and moderate stool burden  Afib w/ RVR - s/p TEE guided DCCV planned with NSR but bradycardiac  - Discontinue Amioradione gtt - Determine long-term anticoagulation goal due to d/c of anticoagulation 2/2 chronic GI bleed   Acute Excaberation on Chronic HFrEF, s/p TAVR (2017) CHF excerebration likely multifactorial with considerations towards worsening Afib w/ RVR and features of MR. NE weaned overnight.   - As above, continue CRRT  - As above, CVP remains elevated  - BNP trend indicates  improvement  - Prior to d/c, consider optimization of T2DM medications with the DPP4-inhibitor Celesta Gentile) being substituted for a SGLT-2 inhibitor  - Cardiology following, appreciate recommendations  - Repleted K and Mg as indicated   OSH, COPD, Acute on Chronic Hypercapnic Respiratory Failure  Baseline O2 requirement at home 2L. Continue to require 4-5L while inpatient potentially related to posturing versus worsening CHF.  - Titrate O2 sats 88-92% on O2  - Patient refused CPAP, CTM  - Outstanding NM pulmonary perfusion & ventilation scan prior to d/c given c/f for CTE  - Continue Breo Ellipta and Incruse Ellipta  - Pulmonology following, appreciate recommendations  Normocytic Anemia   Likely 2/2 chronic GI bleed, AICD, and CKD. Iron studies c/w AICD given elevated ferritin and low-normal TIBC (Fe wnl).  - Trend Hb  - Consider transfusion if <8 given need for optimal perfusion   Hx of GI Bleed - Occult chronic GI bleed with unidentified source by capsule (2019)  - Trend Hb  - CTM for evidence of change or acute hemorrhage while heparinized   C/f Sacral Decubitus Ulcer  - CTM rotations  - Wound NT consulted   Goals of Care  - Patient has expressed goal of seeing birth of great grandchild in October   - Palliative following, appreciate recommendations   Chronic Conditions T2DM - Continue SSI while in ICU  - Continue home rosuvastatin   Hypothyroidism - Continue home Levo     Daily Goals Checklist  Pain/Anxiety/Delirium protocol (if indicated): Tramadol added for PRN pain  VAP protocol (if indicated): N/A Respiratory support goals: discussed Blood pressure target: MAP > 70, discussed above DVT prophylaxis: maintain heparin dose Nutritional status and feeding goals: appetite discussed with patient  GI prophylaxis: continue pantoprazole Fluid status goals: continue CRRT  Urinary catheter: Assessment of intravascular volume Central lines: in place, no signs of  infection Glucose control: continue SSI Mobility/therapy needs: continue as tolerated Antibiotic de-escalation: N/A Home medication reconciliation: To be optimized Daily labs: continue CBC, CMP Code Status: Full Family Communication: Discussed with family at bedside Disposition: TBD  Critical care time: 60 minutes      Domenick Bookbinder, MS4     .

## 2021-01-24 NOTE — Progress Notes (Signed)
  Echocardiogram Echocardiogram Transesophageal has been performed.  Patricia [name] 01/10/2021, 8:37 AM

## 2021-01-24 NOTE — Progress Notes (Addendum)
Daily Progress Note   Patient Name: Patricia [name]       Date: 01/23/2021 DOB: 1936-09-17  Age: 85 y.o. MRN#: [id number] Attending Physician: Kipp Brood, MD Primary Care Physician: [name], NP Admit Date: 01/10/2021  Reason for Consultation/Follow-up: Establishing goals of care  Subjective: Chart review performed. Received report from primary RN - no acute concerns. RN states patient is off levophed. Had cardioversion this morning - patient currently sinus brady. RN reports patient is lethargic today, likely from sedation from cardioversion this morning. RN states patient's PO intake remains minimal.  Went to visit patient at bedside - daughter/Patricia [name] present. Patient is lying in bed asleep - Patricia [name] requests I do not wake patient, as she has been very uncomfortable today and finally has gone to sleep. No signs or non-verbal gestures of pain or discomfort noted. No respiratory distress, increased work of breathing, or secretions noted.   Patricia [name] and I met in conference room - we discussed interval history since last PMT visit. Patricia [name] clearly understands patient's current medical situation. She understands CRRT is not a long term treatment option. Patricia [name] is in favor to stop CRRT tomorrow and "see what happens" because patient is "miserable" as she is now. We discussed goals once CRRT has been stopped, including recommendation to not restart, since it will not change patient's overall long term outcome. Patricia [name] agrees restarting CRRT would not be her goal if patient declines. We talked about transition to comfort measures in house and what that would entail inclusive of medications to control pain, dyspnea, agitation, nausea, itching, and hiccups. We discussed stopping all unnecessary measures such as blood  draws, needle sticks, oxygen, antibiotics, CBGs/insulin, cardiac monitoring, and frequent vital signs. Hospice philosophy and services reviewed in detail as well as prognostication. Patricia [name] is in favor of transition to full comfort measures and transfer to [facility] if patient declines. We discussed that as of now, patient does have decision making capacity; however, likely soon, decisions will fall to Patricia [name]. At this time, Patricia [name] explains patient has stated she would want all life prolonging interventions -  Patricia [name] does not agree that aggressive interventions would be appropriate if patient declines - provided validation. Patricia [name] requests PMT return to speak with patient again tomorrow to discuss goals and options. Reviewed poor PO intake in context of patient's current  acute medical situation - if patient's PO intake remains poor, despite her kidney/heart function, patient will continue to decline - Patricia [name] expresses understanding. Patricia [name] states the patient has expressed she is "miserable." We reviewed quality of life and it's importance in patient's current situation.   Therapeutic listening provided as Patricia [name] reflects on patient's two dogs and her grandson, which bring patient's life meaning.  Patricia [name] provided copy of most current HCPOA and Living Will documents.  Patricia [name] states she feels better after speaking with PMT today and is grateful for visit.   All questions and concerns addressed. Encouraged to call with questions and/or concerns. PMT card provided.   Length of Stay: 8  Current Medications: Scheduled Meds:  . amiodarone  200 mg Oral Daily  . B-complex with vitamin C  1 tablet Oral Daily  . Chlorhexidine Gluconate Cloth  6 each Topical Daily  . feeding supplement  237 mL Oral TID BM  . fluticasone furoate-vilanterol  1 puff Inhalation Daily   And  . umeclidinium bromide  1 puff Inhalation Daily  . insulin aspart  0-5 Units Subcutaneous QHS  . insulin aspart  0-9 Units Subcutaneous TID WC  . levothyroxine  88  mcg Oral Q0600  . midodrine  15 mg Oral TID WC  . pantoprazole  40 mg Oral BID  . QUEtiapine  12.5 mg Oral QHS  . rosuvastatin  10 mg Oral Daily  . sodium chloride flush  10-40 mL Intracatheter Q12H  . sodium chloride flush  3 mL Intravenous Q12H  . sodium chloride flush  3 mL Intravenous Q12H    Continuous Infusions: .  prismasol BGK 4/2.5 500 mL/hr at 01/23/21 1305  .  prismasol BGK 4/2.5 300 mL/hr at 12/26/2020 0445  . sodium chloride 10 mL/hr at 01/13/2021 1400  . heparin 1,300 Units/hr (01/04/2021 1400)  . norepinephrine (LEVOPHED) Adult infusion Stopped (12/26/2020 0818)  . prismasol BGK 4/2.5 1,500 mL/hr at 01/07/2021 1300    PRN Meds: sodium chloride, acetaminophen **OR** acetaminophen, albuterol, ALPRAZolam, azelastine, dextromethorphan-guaiFENesin, docusate sodium, fentaNYL (SUBLIMAZE) injection, Glycerin (Adult), heparin, Muscle Rub, ondansetron **OR** ondansetron (ZOFRAN) IV, polyethylene glycol, sodium chloride flush, sodium chloride flush, traMADol  Physical Exam Vitals and nursing note reviewed.  Constitutional:      General: She is not in acute distress.    Appearance: She is ill-appearing.  Pulmonary:     Effort: No respiratory distress.  Skin:    General: Skin is warm and dry.  Neurological:     Mental Status: She is lethargic.     Motor: Weakness present.             Vital Signs: BP 130/66   Pulse (!) 39   Temp 97.7 F (36.5 C) (Axillary)   Resp 20   Ht _0  (1.575 m)   Wt 94.2 kg   SpO2 96%   BMI 37.98 kg/m  SpO2: SpO2: 96 % O2 Device: O2 Device: Nasal Cannula O2 Flow Rate: O2 Flow Rate (L/min): 4 L/min  Intake/output summary:   Intake/Output Summary (Last 24 hours) at 01/02/2021 1549 Last data filed at 01/08/2021 1400 Gross per 24 hour  Intake 1260.03 ml  Output 3140 ml  Net -1879.97 ml   LBM: Last BM Date: 12/25/2020 Baseline Weight: Weight: 102.9 kg Most recent weight: Weight: 94.2 kg       Palliative Assessment/Data: PPS  20%      Patient Active Problem List   Diagnosis Date Noted  . Pressure ulcer of coccygeal region 01/21/2021  .  Heart failure with preserved ejection fraction ([place])   . CHF exacerbation ([place]) 01/16/2021  . Acute on chronic systolic (congestive) heart failure ([place]) 01/22/2021  . Hyperlipidemia associated with type 2 diabetes mellitus ([place]) 12/29/2020  . Abnormal CT of the chest 06/17/2020  . Chronic respiratory failure with hypoxia ([place]) 03/02/2020  . Rhinitis 03/02/2020  . Pressure injury of skin 07/26/2019  . Dyspnea 07/26/2019  . Acute on chronic diastolic (congestive) heart failure ([place]) 07/25/2019  . Acute diastolic CHF (congestive heart failure) ([place]) 07/25/2019  . COPD with acute exacerbation ([place]) 03/17/2019  . GI bleed 01/20/2018  . UGIB (upper gastrointestinal bleed) 03/11/2017  . AKI (acute kidney injury) ([place]) 03/11/2017  . Lung nodule 01/26/2017  . Macrocytosis 01/20/2017  . Hypoxemia 10/24/2016  . Asthma 10/24/2016  . Hypersomnia 10/24/2016  . Obesity hypoventilation syndrome ([place]) 10/24/2016  . 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 Acute on chronic respiratory failure with hypoxia ([place])   . Aortic valve stenosis 09/11/2016  . Acute blood loss anemia 02/06/2016  . Acute on chronic diastolic heart failure ([place]) 02/06/2016  . Paroxysmal atrial fibrillation ([place]) 02/05/2016  . Primary localized osteoarthrosis of right shoulder 12/18/2015  . Osteoarthritis of right shoulder 12/18/2015  . Post-nasal drainage 11/27/2015  . Anemia 09/26/2015  . Neck pain 05/23/2015  . Knee pain, bilateral 07/20/2014  . Lumbago 11/02/2013  . Trigger finger, acquired 11/02/2013  . Hyperlipidemia 11/02/2013  . Urinary incontinence 11/02/2013  . Rash and nonspecific skin eruption 11/02/2013  . COPD (chronic obstructive pulmonary disease) ([place])   . Hypothyroidism   . Hypertension associated with diabetes ([place]) 03/02/2013  . DM type 2 (diabetes mellitus,  type 2) ([place]) 03/02/2013    Palliative Care Assessment & Plan   Patient Profile: 85 y.o.femalewith past medical history of chronic systolic CHF, chronic respiratory failure with hypoxia on 2L oxygen at home, paroxysmal atrial fibrillation, COPD, s/p TAVR, CKD stage IV, type 2 DM, HTN, hypothyroidism, HLD, LBBB, and OHS not on CPAP. She presented to the emergency departmenton03/29/2022with shortness of breath.In the ED, chest x-ray with pulmonary edema and pleural effusions. BNP 1600. Admitted to [facility] for acute on chronic systolic CHF. Cardiology and PCCM consulted.  Echo 3/23 shows worsening EF (25-30% from prior of 40-45% in January 2021).  Assessment: AKI on CKD stage IV Acute decompensation biventricular failure Atrial fibrillation with RVR Acute on chronic hypoxic respiratory failure History of COPD Cardiorenal syndrom  Recommendations/Plan:  Continue current medical treatment  Continue full code status as previously documented  PMT will visit with patient at 11am tomorrow 4/1 for continued GOC: will discuss plan for stopping CRRT and goals if she declines  Daughter/Patricia [name] agrees with stopping CRRT tomorrow 4/1. If patient declines, she agrees with initiating full comfort measures and transfer to [facility]  Due to minimal PO intake, despite cardio/renal function, it is anticipated patient will continue to decline and would be residential hospice appropriate   Copy obtained of most recent HCPOA and Living Will document - will be scanned into Vynca/ACP tab.  PMT will continue to follow and support holistically   Goals of Care and Additional Recommendations:  Limitations on Scope of Treatment: Full Scope Treatment  Code Status:    Code Status Orders  (From admission, onward)         Start     Ordered   12/27/2020 2322  Full code  Continuous        01/03/2021 2323        Code  Status History    Date Active Date Inactive Code Status Order ID Comments User Context    07/25/2019 2142 07/29/2019 1818 Full Code [id number]  Rise Patience, MD ED   01/20/2018 2057 01/26/2018 1701 Full Code [id number]  Karmen Bongo, MD ED   11/12/2017 1818 11/13/2017 2036 Full Code [id number]  Tommye Standard, Lorelee Cover., PA-C Inpatient   03/11/2017 2141 03/13/2017 1838 Full Code [id number]  Etta Quill, DO ED   10/14/2016 1017 10/16/2016 1632 Full Code [id number]  Rexene Alberts, MD Inpatient   09/11/2016 2249 09/14/2016 1836 Full Code [id number]  Karmen Bongo, MD Inpatient   09/02/2016 1152 09/02/2016 2109 Full Code [id number]  [place], [name] M, MD Inpatient   10/19/2015 0211 10/21/2015 1915 Full Code [id number]  Alfonso Ramus, MD Inpatient   Advance Care Planning Activity       Prognosis:  Poor in the setting of advanced age, cardiorenal syndrome, and multiple comorbidities  Discharge Planning:  To Be Determined  Care plan was discussed with primary RN, patient's daughter/Patricia [name]  Thank you for allowing the Palliative Medicine Team to assist in the care of this patient.   Total Time 67 minutes Prolonged Time Billed  yes       Greater than 50%  of this time was spent counseling and coordinating care related to the above assessment and plan.  Lin Landsman, NP  Please contact Palliative Medicine Team phone at [phone] for questions and concerns.

## 2021-01-24 NOTE — Progress Notes (Signed)
eLink Physician-Brief Progress Note Patient Name: Patricia [name] DOB: [birth year] MRN: [id number]   Date of Service  01/05/2021  HPI/Events of Note  Notified that Phosphorus = 1.9 this AM and not replaced. Creatinine = 1.53.   eICU Interventions  Plan: 1. Repeat Phosphorus level now. Replace as indicated.      Intervention Category Major Interventions: Electrolyte abnormality - evaluation and management  Lysle Dingwall 01/01/2021, 7:54 PM

## 2021-01-24 NOTE — Anesthesia Procedure Notes (Addendum)
Procedure Name: General with mask airway Date/Time: 01/02/2021 8:13 AM Performed by: Imagene Riches, CRNA Pre-anesthesia Checklist: Patient identified, Emergency Drugs available, Suction available, Patient being monitored and Timeout performed Patient Re-evaluated:Patient Re-evaluated prior to induction Oxygen Delivery Method: Simple face mask

## 2021-01-24 NOTE — Procedures (Signed)
Electrical Cardioversion Procedure Note Patricia [name] [id number] November 30, 1935  Procedure: Electrical Cardioversion Indications:  Atrial Fibrillation  Procedure Details Consent: Risks of procedure as well as the alternatives and risks of each were explained to the (patient/caregiver).  Consent for procedure obtained. Time Out: Verified patient identification, verified procedure, site/side was marked, verified correct patient position, special equipment/implants available, medications/allergies/relevent history reviewed, required imaging and test results available.  Performed  Patient placed on cardiac monitor, pulse oximetry, supplemental oxygen as necessary.  Sedation given: Propofol per anesthesiology Pacer pads placed anterior and posterior chest.  Cardioverted 1 time(s).  Cardioverted at [place].  Evaluation Findings: Post procedure EKG shows: NSR Complications: None Patient did tolerate procedure well.   [name] 01/07/2021, 8:24 AM

## 2021-01-25 ENCOUNTER — Encounter (HOSPITAL_COMMUNITY): Payer: Self-pay | Admitting: Cardiology

## 2021-01-25 DIAGNOSIS — J9621 Acute and chronic respiratory failure with hypoxia: Secondary | ICD-10-CM | POA: Diagnosis not present

## 2021-01-25 DIAGNOSIS — N186 End stage renal disease: Secondary | ICD-10-CM

## 2021-01-25 DIAGNOSIS — J449 Chronic obstructive pulmonary disease, unspecified: Secondary | ICD-10-CM | POA: Diagnosis not present

## 2021-01-25 DIAGNOSIS — I5043 Acute on chronic combined systolic (congestive) and diastolic (congestive) heart failure: Secondary | ICD-10-CM | POA: Diagnosis not present

## 2021-01-25 DIAGNOSIS — I5023 Acute on chronic systolic (congestive) heart failure: Secondary | ICD-10-CM | POA: Diagnosis not present

## 2021-01-25 DIAGNOSIS — Z66 Do not resuscitate: Secondary | ICD-10-CM

## 2021-01-25 DIAGNOSIS — G9341 Metabolic encephalopathy: Secondary | ICD-10-CM | POA: Diagnosis not present

## 2021-01-25 DIAGNOSIS — N179 Acute kidney failure, unspecified: Secondary | ICD-10-CM | POA: Diagnosis not present

## 2021-01-25 LAB — RENAL FUNCTION PANEL
Albumin: 3.4 g/dL — ABNORMAL LOW (ref 3.5–5.0)
Anion gap: 4 — ABNORMAL LOW (ref 5–15)
BUN: 16 mg/dL (ref 8–23)
CO2: 27 mmol/L (ref 22–32)
Calcium: 8.1 mg/dL — ABNORMAL LOW (ref 8.9–10.3)
Chloride: 103 mmol/L (ref 98–111)
Creatinine, Ser: 1.49 mg/dL — ABNORMAL HIGH (ref 0.44–1.00)
GFR, Estimated: 35 mL/min — ABNORMAL LOW (ref >60)
Glucose, Bld: 178 mg/dL — ABNORMAL HIGH (ref 70–99)
Phosphorus: 2.9 mg/dL (ref 2.5–4.6)
Potassium: 4.4 mmol/L (ref 3.5–5.1)
Sodium: 134 mmol/L — ABNORMAL LOW (ref 135–145)

## 2021-01-25 LAB — CBC
HCT: 33.1 % — ABNORMAL LOW (ref 36.0–46.0)
Hemoglobin: 9.7 g/dL — ABNORMAL LOW (ref 12.0–15.0)
MCH: 28.3 pg (ref 26.0–34.0)
MCHC: 29.3 g/dL — ABNORMAL LOW (ref 30.0–36.0)
MCV: 96.5 fL (ref 80.0–100.0)
Platelets: 128 10*3/uL — ABNORMAL LOW (ref 150–400)
RBC: 3.43 MIL/uL — ABNORMAL LOW (ref 3.87–5.11)
RDW: 17.3 % — ABNORMAL HIGH (ref 11.5–15.5)
WBC: 7.2 10*3/uL (ref 4.0–10.5)
nRBC: 7.2 % — ABNORMAL HIGH (ref 0.0–0.2)

## 2021-01-25 LAB — HEPARIN LEVEL (UNFRACTIONATED): Heparin Unfractionated: 0.57 IU/mL (ref 0.30–0.70)

## 2021-01-25 LAB — GLUCOSE, CAPILLARY
Glucose-Capillary: 157 mg/dL — ABNORMAL HIGH (ref 70–99)
Glucose-Capillary: 151 mg/dL — ABNORMAL HIGH (ref 70–99)

## 2021-01-25 LAB — COOXEMETRY PANEL
Carboxyhemoglobin: 1 % (ref 0.5–1.5)
Methemoglobin: 1.1 % (ref 0.0–1.5)
O2 Saturation: 62.9 %
Total hemoglobin: 10 g/dL — ABNORMAL LOW (ref 12.0–16.0)

## 2021-01-25 LAB — MAGNESIUM: Magnesium: 2.4 mg/dL (ref 1.7–2.4)

## 2021-01-25 MED ORDER — LORAZEPAM 2 MG/ML IJ SOLN
1.0000 mg | INTRAMUSCULAR | Status: DC | PRN
  Administered 2021-01-26: 1 mg via INTRAVENOUS
  Filled 2021-01-25: qty 1

## 2021-01-25 MED ORDER — GLYCOPYRROLATE 0.2 MG/ML IJ SOLN: 0.2000 mg | INTRAMUSCULAR | Status: DC | PRN

## 2021-01-25 MED ORDER — LORAZEPAM 1 MG PO TABS: 1.0000 mg | ORAL_TABLET | ORAL | Status: DC | PRN

## 2021-01-25 MED ORDER — LORAZEPAM 2 MG/ML PO CONC: 1.0000 mg | ORAL | Status: DC | PRN

## 2021-01-25 MED ORDER — FENTANYL CITRATE (PF) 100 MCG/2ML IJ SOLN
12.5000 ug | INTRAMUSCULAR | Status: DC | PRN
  Administered 2021-01-25: 12.5 ug via INTRAVENOUS
  Administered 2021-01-25 (×2): 25 ug via INTRAVENOUS
  Administered 2021-01-25: 12.5 ug via INTRAVENOUS
  Administered 2021-01-26 (×2): 25 ug via INTRAVENOUS
  Filled 2021-01-25 (×5): qty 2

## 2021-01-25 MED ORDER — HALOPERIDOL LACTATE 5 MG/ML IJ SOLN
2.0000 mg | Freq: Four times a day (QID) | INTRAMUSCULAR | Status: DC | PRN
  Administered 2021-01-25 (×2): 2 mg via INTRAVENOUS
  Filled 2021-01-25 (×2): qty 1

## 2021-01-25 MED ORDER — HALOPERIDOL 1 MG PO TABS
2.0000 mg | ORAL_TABLET | Freq: Four times a day (QID) | ORAL | Status: DC | PRN
  Filled 2021-01-25: qty 2

## 2021-01-25 MED ORDER — GLYCOPYRROLATE 1 MG PO TABS
1.0000 mg | ORAL_TABLET | ORAL | Status: DC | PRN
  Filled 2021-01-25: qty 1

## 2021-01-25 MED ORDER — POLYVINYL ALCOHOL 1.4 % OP SOLN
1.0000 [drp] | Freq: Four times a day (QID) | OPHTHALMIC | Status: DC | PRN
  Filled 2021-01-25: qty 15

## 2021-01-25 MED ORDER — BIOTENE DRY MOUTH MT LIQD: 15.0000 mL | Freq: Two times a day (BID) | OROMUCOSAL | Status: DC

## 2021-01-25 MED ORDER — HALOPERIDOL LACTATE 2 MG/ML PO CONC
2.0000 mg | Freq: Four times a day (QID) | ORAL | Status: DC | PRN
  Filled 2021-01-25: qty 1

## 2021-01-25 MED ORDER — ENSURE ENLIVE PO LIQD: 237.0000 mL | ORAL | Status: DC | PRN

## 2021-01-25 NOTE — Plan of Care (Signed)
  Problem: Clinical Measurements: Goal: Will remain free from infection Outcome: Progressing   Problem: Coping: Goal: Level of anxiety will decrease Outcome: Progressing   Problem: Safety: Goal: Ability to remain free from injury will improve Outcome: Progressing   Problem: Education: Goal: Knowledge of General Education information will improve Description: Including pain rating scale, medication(s)/side effects and non-pharmacologic comfort measures Outcome: Not Progressing   Problem: Health Behavior/Discharge Planning: Goal: Ability to manage health-related needs will improve Outcome: Not Progressing   Problem: Clinical Measurements: Goal: Ability to maintain clinical measurements within normal limits will improve Outcome: Not Progressing Goal: Diagnostic test results will improve Outcome: Not Progressing Goal: Respiratory complications will improve Outcome: Not Progressing Goal: Cardiovascular complication will be avoided Outcome: Not Progressing   Problem: Activity: Goal: Risk for activity intolerance will decrease Outcome: Not Progressing   Problem: Nutrition: Goal: Adequate nutrition will be maintained Outcome: Not Progressing   Problem: Elimination: Goal: Will not experience complications related to bowel motility Outcome: Not Progressing Goal: Will not experience complications related to urinary retention Outcome: Not Progressing   Problem: Pain Managment: Goal: General experience of comfort will improve Outcome: Not Progressing   Problem: Skin Integrity: Goal: Risk for impaired skin integrity will decrease Outcome: Not Progressing   Problem: Education: Goal: Ability to demonstrate management of disease process will improve Outcome: Not Progressing Goal: Ability to verbalize understanding of medication therapies will improve Outcome: Not Progressing   Problem: Activity: Goal: Capacity to carry out activities will improve Outcome: Not Progressing    Problem: Cardiac: Goal: Ability to achieve and maintain adequate cardiopulmonary perfusion will improve Outcome: Not Progressing

## 2021-01-25 NOTE — Progress Notes (Addendum)
Manufacturing engineer [facility]) Hospital Liaison note.    Addendum: Per ACC MD pt eligible for [facility].  Received request from [place] for family interest in [facility]. Chart and pt information under review by [facility] physician.  Hospice eligibility pending at this time.  [place] is unable to offer a room today. Hospital Liaison will follow up tomorrow or sooner if a room becomes available. Please do not hesitate to call with questions.    Thank you for the opportunity to participate in this patient's care.  Domenic Moras, BSN, RN [facility] Liaison (listed on [place] under Hospice/[facility])    [phone] (24h on call)

## 2021-01-25 NOTE — Progress Notes (Signed)
Attempted to call nurse report to Room [phone]. Receiving nurse is currently off of the unit with another patient. Awaiting nurse availability for report and transfer. Patient resting comfortably. Family at bedside.

## 2021-01-25 NOTE — Progress Notes (Addendum)
[place] for heparin  Indication: atrial fibrillation  Allergies  Allergen Reactions  . Codeine Other (See Comments)    "Spaces out" the patient and she cannot walk well  . Vesicare [Solifenacin] Itching    Patient Measurements: Height: _0  (157.5 cm) Weight: 94.8 kg (208 lb 15.9 oz) IBW/kg (Calculated) : 50.1 HEPARIN DW (KG): 74.7   Vital Signs: Temp: 97.7 F (36.5 C) (04/01 0700) Temp Source: Axillary (04/01 0000) BP: 116/39 (04/01 0900) Pulse Rate: 46 (04/01 0900)  Labs: Recent Labs    01/23/21 0425 01/23/21 1600 01/21/2021 0338 12/30/2020 1713 01/25/21 0503 01/25/21 0804  HGB 10.1*  --  10.2*  --   --  9.7*  HCT 34.3*  --  33.9*  --   --  33.1*  PLT 157  --  140*  --   --  128*  APTT 65*  --  75*  --   --   --   LABPROT  --   --  16.1*  --   --   --   INR  --   --  1.3*  --   --   --   HEPARINUNFRC 0.56  --  0.56  --  0.57  --   CREATININE 1.51*   < > 1.38* 1.53* 1.49*  --    < > = values in this interval not displayed.    Estimated Creatinine Clearance: 31.2 mL/min (A) (by C-G formula based on SCr of 1.49 mg/dL (H)).   Medical History:   Medications:  .  prismasol BGK 4/2.5 500 mL/hr at 01/25/21 0547  .  prismasol BGK 4/2.5 300 mL/hr at 01/25/21 0517  . sodium chloride Stopped (01/25/21 0922)  . heparin 1,300 Units/hr (01/25/21 0900)  . norepinephrine (LEVOPHED) Adult infusion Stopped (01/19/2021 0818)  . prismasol BGK 4/2.5 1,500 mL/hr at 01/25/21 6203     Assessment: 85 yo female with HF and AKI noted on CRRT. She was on apixaban (last dose 3/26 at ~ 6pm) and pharmacy was consulted to start heparin.   Heparin level therapeutic today at 0.57. No issues with heparin gtt noted. CBC remains stable.  Goal of Therapy:  Heparin level 0.3-0.7 units/ml Monitor platelets by anticoagulation protocol: Yes   Plan:  Continue IV heparin at 1,300 units/hr Follow daily heparin level, CBC, s/sx bleeding  [place] Crane, Roylene Reason, [facility] Clinical Pharmacist  01/25/2021 9:39 AM   [facility] pharmacy phone numbers are listed on [email or web address]

## 2021-01-25 NOTE — Progress Notes (Addendum)
Patient ID: Patricia [name], female   DOB: 1936-03-10, 85 y.o.   MRN: [id number] P    Advanced Heart Failure Rounding Note  PCP-Cardiologist: [name] Martinique, MD   Subjective:    CVVH started 3/27, I/Os negative again. Remains anuric. CVP 21  S/p DCCV yesterday. In sinus brady, upper 40s-low 50s. Off amio gtt, on PO. Remains on IV heparin.   Now off NE w/ stable BP. Co-ox 63%.   Drowsy this morning but arousable. Received pain meds overnight for pain.  Palliative care meeting planned today.   TEE 3/31 showed EF 25-30%, diffuse hypokinesis with septal-lateral dyssynchrony, moderate RV dysfunction, moderate TR, moderate MR, bioprosthetic aortic valve s/p TAVR with mean gradient 15 mmHg and mild peri-valvular leakage.    Objective:   Weight Range: 94.8 kg Body mass index is 38.23 kg/m.   Vital Signs:   Temp:  [97.7 F (36.5 C)-98.1 F (36.7 C)] 98.1 F (36.7 C) (04/01 0000) Pulse Rate:  [39-101] 49 (04/01 0600) Resp:  [10-28] 14 (04/01 0600) BP: (84-163)/(40-117) 135/44 (04/01 0600) SpO2:  [89 %-100 %] 94 % (04/01 0600) Weight:  [94.8 kg] 94.8 kg (04/01 0500) Last BM Date: 01/11/2021  Weight change: Filed Weights   01/23/21 0500 12/31/2020 0500 01/25/21 0500  Weight: 93.8 kg 94.2 kg 94.8 kg    Intake/Output:   Intake/Output Summary (Last 24 hours) at 01/25/2021 0707 Last data filed at 01/25/2021 0700 Gross per 24 hour  Intake 1191.86 ml  Output 2395 ml  Net -1203.14 ml      Physical Exam   CVP 21 General: elderly moderately obese WF NAD Neck: Thick, JVP elevated to jaw, no thyromegaly or thyroid nodule.  Lungs: Clear to auscultation bilaterally with normal respiratory effort. CV: Nondisplaced PMI.  Regular rhythm, slow rate, no murmur.  1+ ankle edema L>R.   Abdomen: Soft, nontender, no hepatosplenomegaly, no distention.  Skin: Intact without lesions or rashes.   Neurologic: Drowsy after pain meds but arousable. Moves all 4 extremities w/o difficulty  Psych: Normal  affect. Extremities: No clubbing or cyanosis.  HEENT: Normal.    Telemetry   Sinus brady, upper 40s-low 50s 9personally reviewed)  Labs    CBC Recent Labs    01/23/21 0425 01/19/2021 0338  WBC 8.0 7.1  HGB 10.1* 10.2*  HCT 34.3* 33.9*  MCV 97.4 98.0  PLT 157 032*   Basic Metabolic Panel Recent Labs    12/30/2020 0338 01/16/2021 1713 01/13/2021 2030 01/25/21 0503  NA 133* 132*  --  134*  K 4.7 4.8  --  4.4  CL 101 100  --  103  CO2 26 28  --  27  GLUCOSE 162* 171*  --  178*  BUN 14 16  --  16  CREATININE 1.38* 1.53*  --  1.49*  CALCIUM 8.4* 8.5*  --  8.1*  MG 2.4  --   --  2.4  PHOS 2.3* 1.9* 1.8* 2.9   Liver Function Tests Recent Labs    12/29/2020 1713 01/25/21 0503  ALBUMIN 3.5 3.4*   No results for input(s): LIPASE, AMYLASE in the last 72 hours. Cardiac Enzymes No results for input(s): CKTOTAL, CKMB, CKMBINDEX, TROPONINI in the last 72 hours.  BNP: BNP (last 3 results) Recent Labs    12/17/20 0022 12/28/2020 1904 01/22/21 1106  BNP 574.3* 1,681.6* 1,143.9*    ProBNP (last 3 results) No results for input(s): PROBNP in the last 8760 hours.   D-Dimer No results for input(s): DDIMER in the last 72  hours. Hemoglobin A1C No results for input(s): HGBA1C in the last 72 hours. Fasting Lipid Panel No results for input(s): CHOL, HDL, LDLCALC, TRIG, CHOLHDL, LDLDIRECT in the last 72 hours. Thyroid Function Tests No results for input(s): TSH, T4TOTAL, T3FREE, THYROIDAB in the last 72 hours.  Invalid input(s): FREET3  Other results:   Imaging    No results found.   Medications:     Scheduled Medications: . amiodarone  200 mg Oral Daily  . B-complex with vitamin C  1 tablet Oral Daily  . Chlorhexidine Gluconate Cloth  6 each Topical Daily  . feeding supplement  237 mL Oral TID BM  . fluticasone furoate-vilanterol  1 puff Inhalation Daily   And  . umeclidinium bromide  1 puff Inhalation Daily  . insulin aspart  0-5 Units Subcutaneous QHS  .  insulin aspart  0-9 Units Subcutaneous TID WC  . levothyroxine  88 mcg Oral Q0600  . midodrine  15 mg Oral TID WC  . pantoprazole  40 mg Oral BID  . QUEtiapine  12.5 mg Oral QHS  . rosuvastatin  10 mg Oral Daily  . sodium chloride flush  10-40 mL Intracatheter Q12H  . sodium chloride flush  3 mL Intravenous Q12H  . sodium chloride flush  3 mL Intravenous Q12H    Infusions: .  prismasol BGK 4/2.5 500 mL/hr at 01/25/21 0547  .  prismasol BGK 4/2.5 300 mL/hr at 01/25/21 0517  . sodium chloride 10 mL/hr at 01/25/21 0700  . heparin 1,300 Units/hr (01/25/21 0700)  . norepinephrine (LEVOPHED) Adult infusion Stopped (01/19/2021 0818)  . prismasol BGK 4/2.5 1,500 mL/hr at 01/25/21 0353    PRN Medications: sodium chloride, acetaminophen **OR** acetaminophen, albuterol, ALPRAZolam, azelastine, dextromethorphan-guaiFENesin, docusate sodium, fentaNYL (SUBLIMAZE) injection, Glycerin (Adult), heparin, Muscle Rub, ondansetron **OR** ondansetron (ZOFRAN) IV, polyethylene glycol, sodium chloride flush, sodium chloride flush, traMADol  Assessment/Plan   1. Acute on chronic systolic CHF: Cause of cardiomyopathy uncertain.  Patient has history of 1-vessel CAD with 75% OM3 stenosis on 11/17 cath.  Cannot rule out tachy-mediated CMP.  Echo this admission with EF 25-30%, severe LVH, moderately decreased RV systolic function, mild-moderate MR, stable TAVR valve with mean gradient 12 mmHg, IVC dilated. RHC with elevated RA pressure and PA pressure, unable to get PCWP, CI 2.2.  TEE 3/31 with EF 25-30%, diffuse hypokinesis with septal-lateral dyssynchrony, moderate RV dysfunction, moderate TR, moderate MR, bioprosthetic aortic valve s/p TAVR with mean gradient 15 mmHg and mild peri-valvular leakage. Diuresis attempted but progressed poorly on Lasix gtt 20 mg/hr + metolazone 5 bid with rising creatinine.  CVVH started 3/27. Remains anuric.  I/Os negative over the last day remains volume overloaded. CVP 20. Now off NE.  Co-ox 63%   - Has wide LBBB, may be CRT candidate down the road but would need significant stabilization.  2. CAD: LHC in 11/17 with 75% OM3.  Not candidate for coronary angiography at this point with AKI. No chest pain.   - Continue statin.  3. Atrial fibrillation: History of PAF, admitted here with AF/RVR, HR in 110s generally.  This likely contributes to CHF exacerbation. Has not been on anticoagulation due to history of recurrent GI bleeding. Now back in NSR with DCCV on 3/31.  - off amio gtt, on PO 200 mg daily, bradycardic upper 40s-50s, monitor  - Now on heparin gtt.  Suspect she will be able to tolerate apixaban for at least a month or so post-DCCV, no overt bleeding this admission.  4. AKI  on CKD stage IIIb: Creatinine up to 3.58 with poor diuresis.  Suspect cardiorenal syndrome.  Poor response to high dose diuretics.  Suspect she is a  poor candidate for long-term HD with advanced HF, but reasonable to try short-term CVVH to see if she can stabilize with volume removal acutely.  - Nephrology following, CVVH as above. 5. H/o TAVR: Bioprosthetic AoV stable on 3/31 TEE with mild PVL and mean gradient 15 mmHg.  6. COPD, OHS: On home oxygen at baseline.  7. Deconditioning: PT/OT following. 8. GOC: overall prognosis is poor. Would be poor iHD candidate w/ advanced HF.  - palliative care meeting planned today w/ family    Length of Stay: [address] PA-C  01/25/2021, 7:07 AM  Advanced Heart Failure Team Pager [phone] (M-F; 7a - 5p)  Please contact [facility] for night-coverage after hours (5p -7a ) and weekends on [email or web address]  Patient seen with PA, agree with the above note.   Patient looks uncomfortable, will answer questions.  She remains in NSR s/p DCCV yesterday.  I/Os mildly negative with CVVH but CVVH has not been functional overnight.  CVP now > 20.  She is not making urine.   Patient is on po amiodarone now and off norepinephrine.  Co-ox 63%.   General:  Uncomfortable-appearing Neck: JVP 14+ cm, no thyromegaly or thyroid nodule.  Lungs: Crackles at bases.  CV: Nondisplaced PMI.  Heart regular S1/S2, no S3/S4, no murmur.  Trace ankle edema.  Abdomen: Soft, nontender, no hepatosplenomegaly, no distention.  Skin: Intact without lesions or rashes.  Neurologic: Alert and oriented x 3.  Psych: Normal affect. Extremities: No clubbing or cyanosis.  HEENT: Normal.   She remains in NSR on amiodarone po, currently on heparin gtt.  Slight drop in plts yesterday, need to check today.   CVVH malfunctioning this morning, unable to pull.  CVP still > 20.  She is not making any urine.  I suspect that her renal function is not going to recover, and I think that she is going to tolerate long-term HD poorly.  - Need to get CVVH functioning, continue to pull net UF 100-150 cc/hr for now.  Tolerating off NE.  - Palliative care to meet with family later today, transition to comfort measures/hospice would be appropriate at this point.  If this decision is made, can stop CVVH.   CRITICAL CARE Performed by: [name]  Total critical care time: 35 minutes  Critical care time was exclusive of separately billable procedures and treating other patients.  Critical care was necessary to treat or prevent imminent or life-threatening deterioration.  Critical care was time spent personally by me on the following activities: development of treatment plan with patient and/or surrogate as well as nursing, discussions with consultants, evaluation of patient's response to treatment, examination of patient, obtaining history from patient or surrogate, ordering and performing treatments and interventions, ordering and review of laboratory studies, ordering and review of radiographic studies, pulse oximetry and re-evaluation of patient's condition.  [name] 01/25/2021 7:57 AM

## 2021-01-25 NOTE — Progress Notes (Signed)
NAME:  Patricia [name], MRN:  [id number], DOB:  Feb 23, 1936, LOS: 9 ADMISSION DATE:  01/14/2021, CONSULTATION DATE:  01/25/21 REFERRING MD:  Starla Link - TRH, CHIEF COMPLAINT:  SOB  HPI/course in hospital  85 yo F with a relevant PMH of OSH c/b COPD and hypoxia (baseline home O2 2L) with pulmonary HTN by RHC (3/25), CKD IV, Afib, CAD w/ HFref (3/23 EF: 25-30% vs recent baseline 40-45%), s/p TAVR (09/2016), T2DM, and hx of chronic GI bleed.   On admission, patient described increased SOB on 2L O2 at home, bilateral lower extremity pitting edema, and fluctuating/elevated HR. Describes process as "long time in making". BNP 1681, CXR with bilateral pleural effusions and pulmonary edema. Admitted to [facility] for acute heart failure exacerbation. Troponin trend at that time not c/w ACS. Got 59m lasix in ED, and total of 1475mLasix 3/23, lasix w/ metolazone, without a robust response. ECHO obtained 3/23 which revealed LVEF 25-30%, LVH, reduced RV systolic function but normal RV size. Severely dilated LA. Moderately dilated RA.    Following TEE DCCV (3/31) patient has bradycardiac sinus rhythm but remains anuric. Discussions with family and palliative care are ongoing to establish goals of care given ESRD and poor candidacy for HD.    3/24 Cr has bumped and lasix being held. Cardiology suggests PCCM consult to evaluate SOB for pulmonary etiology of SOB. 3/26: Transfer to ICU for initiation of CRRT 3/27: CRRT w/ CVHHD 3/28: continue CVVHD for kidney recover and euvolemia  3/29: weight 98.5 >> 95.7 kg, increased inotropic support required ON   3/30: weight 98.5 >> 93.8 kg, appears euvolemic, increased inotropic support required ON   3/31: TEE DCCV successful with sinus rhythm. Bradycardiac. Off NE support.  4/1: CRRT discontinuation trial   Past Medical History   Past Medical History:  Diagnosis Date  . Allergy   . Anemia, unspecified   . Arthritis   . Asthma   . [facility]   s/p TEE/DCCV>>SR on amio  . Benign neoplasm of colon   . Carpal tunnel syndrome   . Cellulitis and abscess of finger, unspecified   . Cerumen impaction   . Cervicalgia   . CHF (congestive heart failure) ([facility]  . Chronic airway obstruction, not elsewhere classified   . Chronic anticoagulation 09/2015   Eliquis  . Diarrhea   . Diffuse cystic mastopathy   . Edema   . Encounter for long-term (current) use of other medications   . GERD (gastroesophageal reflux disease)   . Heart murmur   . Lumbago   . Neck pain 05/23/2015  . Obesity, unspecified   . Other and unspecified hyperlipidemia   . Other psoriasis   . Pain in joint, lower leg   . PONV (postoperative nausea and vomiting)   . Primary localized osteoarthrosis of right shoulder 12/18/2015  . 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 Scoliosis (and kyphoscoliosis), idiopathic   . Shortness of breath dyspnea    with exertion  . Type II or unspecified type diabetes mellitus without mention of complication, uncontrolled   . Unspecified essential hypertension   . Unspecified hemorrhoids without mention of complication   . Unspecified hypothyroidism   . Urge incontinence      Past Surgical History:  Procedure Laterality Date  . ABDOMINAL HYSTERECTOMY  1987   complete  . BREAST BIOPSY Right   .  BREAST EXCISIONAL BIOPSY Left 2011  . BREAST EXCISIONAL BIOPSY Right    x2  . BREAST SURGERY     right breast 3 surgeries [phone]  . CARDIAC CATHETERIZATION N/A 09/02/2016   Procedure: Right/Left Heart Cath and Coronary Angiography;  Surgeon: [name] M Martinique, MD;  Location: [place] CV LAB;  Service: Cardiovascular;  Laterality: N/A;  . CARDIOVERSION N/A 10/19/2015   Procedure: CARDIOVERSION;  Surgeon: Lelon Perla, MD;  Location: [facility] ENDOSCOPY;  Service: Cardiovascular;   Laterality: N/A;  . CARDIOVERSION N/A 11/13/2017   Procedure: CARDIOVERSION;  Surgeon: Jerline Pain, MD;  Location: [facility] ENDOSCOPY;  Service: Cardiovascular;  Laterality: N/A;  . CARDIOVERSION N/A 07/28/2019   Procedure: CARDIOVERSION;  Surgeon: Buford Dresser, MD;  Location: [facility] ENDOSCOPY;  Service: Cardiovascular;  Laterality: N/A;  . CARDIOVERSION N/A 01/08/2021   Procedure: CARDIOVERSION;  Surgeon: Larey Dresser, MD;  Location: [facility] ENDOSCOPY;  Service: Cardiovascular;  Laterality: N/A;  . [place]  . COLON SURGERY     2004,2007,2013.  3 times colon surgieres  . ESOPHAGOGASTRODUODENOSCOPY (EGD) WITH PROPOFOL Left 03/13/2017   Procedure: ESOPHAGOGASTRODUODENOSCOPY (EGD) WITH PROPOFOL;  Surgeon: Ronnette Juniper, MD;  Location: [place];  Service: Gastroenterology;  Laterality: Left;  . ESOPHAGOGASTRODUODENOSCOPY (EGD) WITH PROPOFOL Left 01/22/2018   Procedure: ESOPHAGOGASTRODUODENOSCOPY (EGD) WITH PROPOFOL;  Surgeon: Arta Silence, MD;  Location: [facility] ENDOSCOPY;  Service: Endoscopy;  Laterality: Left;  . EXCISE LE MANDIBULAR LYMPH NODE T     DR C. NEWMAN  . FOOT SURGERY  1989  . GIVENS CAPSULE STUDY Left 01/23/2018   Procedure: GIVENS CAPSULE STUDY;  Surgeon: Wilford Corner, MD;  Location: [facility] ENDOSCOPY;  Service: Endoscopy;  Laterality: Left;  . LEFT SHOULDER ARTHROSCOPY    . 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 NEUROPLASTY / TRANSPOSITION MEDIAN NERVE AT CARPAL TUNNEL BILATERAL  05/2003  . RIGHT HEART CATH N/A 12/27/2020   Procedure: RIGHT HEART CATH;  Surgeon: Leonie Man, MD;  Location: [place] CV LAB;  Service: Cardiovascular;  Laterality: N/A;  . TEE WITHOUT CARDIOVERSION N/A 10/19/2015   Procedure: Transesophageal Echocardiogram (TEE) ;  Surgeon: Lelon Perla, MD;  Location: [facility] ENDOSCOPY;  Service: Cardiovascular;  Laterality: N/A;  . TEE WITHOUT CARDIOVERSION N/A 10/14/2016   Procedure: TRANSESOPHAGEAL ECHOCARDIOGRAM (TEE);  Surgeon: Sherren Mocha, MD;  Location: [place];  Service: Open Heart Surgery;  Laterality: N/A;  . TEE WITHOUT CARDIOVERSION N/A 07/28/2019   Procedure: TRANSESOPHAGEAL ECHOCARDIOGRAM (TEE);  Surgeon: Buford Dresser, MD;  Location: [facility] ENDOSCOPY;  Service: Cardiovascular;  Laterality: N/A;  . TEE WITHOUT CARDIOVERSION N/A 12/26/2020   Procedure: TRANSESOPHAGEAL ECHOCARDIOGRAM (TEE);  Surgeon: Larey Dresser, MD;  Location: [facility] ENDOSCOPY;  Service: Cardiovascular;  Laterality: N/A;  . TOTAL SHOULDER ARTHROPLASTY Right 12/18/2015   Procedure: TOTAL SHOULDER ARTHROPLASTY;  Surgeon: [name], MD;  Location: [place];  Service: Orthopedics;  Laterality: Right;  . TRANSCATHETER AORTIC VALVE REPLACEMENT, TRANSFEMORAL N/A 10/14/2016   Procedure: TRANSCATHETER AORTIC VALVE REPLACEMENT, TRANSFEMORAL;  Surgeon: Sherren Mocha, MD;  Location: [place];  Service: Open Heart Surgery;  Laterality: N/A;  . TRIGGER FINGER RELEASE Left   . VAGINAL CYST REMOVED  1967     Review of Systems:   ROS  Social History   reports that she quit smoking about 31 years ago. She has a 40.00 pack-year smoking history. She has never used smokeless tobacco. She reports that she does not drink alcohol and does not use drugs.   Family History  Her family history includes Arthritis in her sister; Asthma in her sister; Diabetes in her father; Heart disease in her brother and father; Liver cancer in her sister; Stroke in her father. There is no history of Breast cancer.   Allergies Allergies  Allergen Reactions  . Codeine Other (See Comments)    "Spaces out" the patient and she cannot walk well  . Vesicare [Solifenacin] Itching     Home Medications  Prior to Admission medications   Medication Sig Start Date End Date Taking? Authorizing Provider  acetaminophen (TYLENOL) 650 MG CR tablet Take 1,300 mg by mouth every 8 (eight) hours as needed for pain.   Yes [provider]  albuterol (PROVENTIL) (2.5 MG/3ML) 0.083% nebulizer  solution USE 1 VIAL IN NEBULIZER EVERY 4 HOURS AND AS NEEDED. Generic: VENTOLIN Patient taking differently: Take 2.5 mg by nebulization every 4 (four) hours as needed for shortness of breath or wheezing. 07/12/20  Yes Brand Males, MD  albuterol (VENTOLIN HFA) 108 (90 Base) MCG/ACT inhaler INHALE 2 PUFFS INTO THE LUNGS EVERY 6 HOURS AS NEEDED FOR WHEEZING OR SHORTNESS OF BREATH Patient taking differently: Inhale 2 puffs into the lungs every 6 (six) hours as needed for shortness of breath or wheezing. 10/10/20  Yes [name], NP  amiodarone (PACERONE) 200 MG tablet Take 1 tablet (200 mg total) by mouth daily. 06/29/20  Yes Martinique, [name] M, MD  azelastine (ASTELIN) 0.1 % nasal spray Place 1 spray into both nostrils 2 (two) times daily. Use in each nostril as directed Patient taking differently: Place 1 spray into both nostrils 2 (two) times daily as needed. Use in each nostril as directed 03/02/20  Yes Martyn Ehrich, NP  Blood Glucose Monitoring Suppl [facility] VERIO) w/Device KIT Check blood sugar twice daily DX:E11.9 10/12/19  Yes [name], NP  cetirizine (ZYRTEC) 10 MG tablet Take 10 mg by mouth daily as needed for allergies.    Yes [provider]  colestipol (COLESTID) 1 g tablet Take 1 g by mouth 2 (two) times daily. 06/29/20  Yes [provider]  Dextromethorphan-guaiFENesin (MUCINEX DM) 30-600 MG TB12 Take 2 tablets by mouth as needed (cough).   Yes [provider]  doxazosin (CARDURA) 4 MG tablet TAKE 1 TABLET BY MOUTH EVERY DAY TO HELP CONTROL BLOOD PRESSURE Patient taking differently: Take 4 mg by mouth daily. 09/12/20  Yes [name], NP  ferrous sulfate 325 (65 FE) MG EC tablet Take 325 mg by mouth 2 (two) times daily.   Yes [provider]  furosemide (LASIX) 40 MG tablet TAKE 2 TABLETS BY MOUTH DAILY AND 1 TABLET BY MOUTH AT LUNCH on  Mondays, Wednesdays and Fridays Patient taking differently: Take 40-80 mg by mouth See  admin instructions. 2 tablets in the morning  daily. Take 1 tablet at lunch on m,w,f in addition to morning dose 09/17/20  Yes Eubanks, Carlos American, NP  glimepiride (AMARYL) 1 MG tablet TAKE 1 TABLET(1 MG) BY MOUTH DAILY WITH BREAKFAST Patient taking differently: Take 1 mg by mouth daily with breakfast. 11/19/20  Yes [name], NP  glucose blood test strip 1 each by Other route 2 (two) times daily. OneTouch Verio DX E11.8 10/12/19  Yes Eubanks, Carlos American, NP  JANUVIA 50 MG tablet TAKE 1 TABLET(50 MG) BY MOUTH DAILY Patient taking differently: Take 50 mg by mouth daily. 10/10/20  Yes [name], NP  Lancets [facility] ULTRASOFT) lancets 1 each by Other route 2 (two) times daily.  OneTouch Verio DX E11.9 10/12/19  Yes [name], NP  levothyroxine (SYNTHROID) 88 MCG tablet TAKE 1 TABLET BY MOUTH DAILY ON AN EMPTY STOMACH Patient taking differently: Take 88 mcg by mouth daily before breakfast. 08/29/20  Yes Eubanks, Carlos American, NP  OVER THE COUNTER MEDICATION Place 1 drop into both eyes daily. [facility] brand lubricating eye drop   Yes [provider]  OXYGEN Inhale 2 L into the lungs continuous. Pt also uses 4L pulse via POC   Yes [provider]  pantoprazole (PROTONIX) 40 MG tablet TAKE 1 TABLET(40 MG) BY MOUTH TWICE DAILY Patient taking differently: Take 40 mg by mouth 2 (two) times daily. 07/11/20  Yes [name], NP  potassium gluconate 595 (99 K) MG TABS tablet Take 595 mg by mouth 2 (two) times daily.   Yes [provider]  predniSONE (DELTASONE) 10 MG tablet Take 4 tabs po daily x 3 days; then 3 tabs daily x3 days; then 2 tabs daily x3 days; then 1 tab daily x 3 days; then stop 01/14/21  Yes Martyn Ehrich, NP  rosuvastatin (CRESTOR) 10 MG tablet TAKE 1 TABLET(10 MG) BY MOUTH DAILY Patient taking differently: Take 10 mg by mouth daily. 10/31/20  Yes [name], NP  TRELEGY ELLIPTA 200-62.5-25 MCG/INH AEPB INHALE 1 PUFF INTO THE LUNGS  DAILY 10/15/20  Yes Martyn Ehrich, NP  cholecalciferol (VITAMIN D) 1000 units tablet Take 1,000 Units by mouth at bedtime.     [provider]     Interim history/subjective:  Patient very somnolent and drowsy this morning. Unable to thoroughly engage with team. Issues overnight with CRRT clotting and patient received fentanyl for pain. Remains extremely frail. Discussion with palliative care yesterday with daughter. Planned discussion with patient today at 1100. Will continue to establish [place]. Nurse at bedside with no new concerns.   Objective   Blood pressure (!) 116/39, pulse (!) 46, temperature 97.7 F (36.5 C), resp. rate 15, height _0  (1.575 m), weight 94.8 kg, SpO2 100 %. CVP:  [20 mmHg-23 mmHg] 22 mmHg      Intake/Output Summary (Last 24 hours) at 01/25/2021 0945 Last data filed at 01/25/2021 0931 Gross per 24 hour  Intake 1281.44 ml  Output 2395 ml  Net -1113.56 ml   Filed Weights   01/23/21 0500 01/12/2021 0500 01/25/21 0500  Weight: 93.8 kg 94.2 kg 94.8 kg    Examination: Gen: NAD, drowsy, [place] in place, evidence of deconditioning and weakness, covered in several blankets  CV: bradycardiac S1/S2, no m/r/g, JVD trace / hard to appreciate (approx 8 cm) Pulm: CTAB in upper fields, moving air appropriately  GI: soft, NTND   Neuro: Moves extremities at least against gravity - not formally assessed  Skin: LIJ HD cath in place w/out e/d/i    Ancillary tests (personally reviewed)  CBC: Recent Labs  Lab 01/21/21 0357 01/22/21 0448 01/23/21 0425 12/31/2020 0338 01/25/21 0804  WBC 6.4 8.1 8.0 7.1 7.2  HGB 9.3* 9.8* 10.1* 10.2* 9.7*  HCT 30.0* 31.9* 34.3* 33.9* 33.1*  MCV 93.8 95.2 97.4 98.0 96.5  PLT 144* 162 157 140* 128*    Basic Metabolic Panel: Recent Labs  Lab 01/21/21 0357 01/21/21 1644 01/22/21 0448 01/22/21 1600 01/23/21 0425 01/23/21 1600 01/20/2021 0338 12/27/2020 1713 01/14/2021 2030 01/25/21 0503  NA 134*   < > 134*   < > 133* 135 133* 132*   --  134*  K 3.6   < > 4.4   < >  4.6 4.7 4.7 4.8  --  4.4  CL 96*   < > 100   < > 99 100 101 100  --  103  CO2 29   < > 29   < > _0 --  27  GLUCOSE 172*   < > 205*   < > 177* 182* 162* 171*  --  178*  BUN 45*   < > 19   < > _1 --  16  CREATININE 2.03*   < > 1.61*   < > 1.51* 1.51* 1.38* 1.53*  --  1.49*  CALCIUM 8.1*   < > 8.3*   < > 8.6* 8.5* 8.4* 8.5*  --  8.1*  MG 2.1  --  2.4  --  2.4  --  2.4  --   --  2.4  PHOS 2.9   < > 2.6   < > 2.6 2.7 2.3* 1.9* 1.8* 2.9   < > = values in this interval not displayed.   GFR: Estimated Creatinine Clearance: 31.2 mL/min (A) (by C-G formula based on SCr of 1.49 mg/dL (H)). Recent Labs  Lab 01/22/21 0448 01/23/21 0425 01/07/2021 0338 01/25/21 0804  WBC 8.1 8.0 7.1 7.2    Liver Function Tests: Recent Labs  Lab 01/23/21 0425 01/23/21 1600 01/09/2021 0338 01/05/2021 1713 01/25/21 0503  ALBUMIN 3.4* 3.4* 3.3* 3.5 3.4*   No results for input(s): LIPASE, AMYLASE in the last 168 hours. No results for input(s): AMMONIA in the last 168 hours.  ABG    Component Value Date/Time   PHART 7.366 06/12/2020 1018   PCO2ART 57.9 (H) 06/12/2020 1018   PO2ART 84.9 06/12/2020 1018   HCO3 35.3 (H) 12/31/2020 1543   HCO3 34.5 (H) 01/17/2021 1543   TCO2 37 (H) 01/20/2021 1543   TCO2 37 (H) 01/04/2021 1543   O2SAT 62.9 01/25/2021 0503     Coagulation Profile: Recent Labs  Lab 01/11/2021 0338  INR 1.3*    Cardiac Enzymes: No results for input(s): CKTOTAL, CKMB, CKMBINDEX, TROPONINI in the last 168 hours.  HbA1C: Hgb A1c MFr Bld  Date/Time Value Ref Range Status  01/16/2021 04:37 AM 7.1 (H) 4.8 - 5.6 % Final    Comment:    (NOTE) Pre diabetes:          5.7%-6.4%  Diabetes:              >6.4%  Glycemic control for   <7.0% adults with diabetes   09/17/2020 12:00 AM 7.3 (H) <5.7 % of total Hgb Final    Comment:    For someone without known diabetes, a hemoglobin A1c value of 6.5% or greater indicates that they may have   diabetes and this should be confirmed with a follow-up  test. . For someone with known diabetes, a value <7% indicates  that their diabetes is well controlled and a value  greater than or equal to 7% indicates suboptimal  control. A1c targets should be individualized based on  duration of diabetes, age, comorbid conditions, and  other considerations. . Currently, no consensus exists regarding use of hemoglobin A1c for diagnosis of diabetes for children. .     CBG: Recent Labs  Lab 01/12/2021 0631 01/10/2021 1124 01/23/2021 1551 01/01/2021 2129 01/25/21 0640  GLUCAP 155* 155* 188* 137* 157*     Assessment & Plan:  74 yoF remains in frail condition with a relevant PMH of OSH c/b COPD and hypoxia (baseline home O2 2L)  with pulmonary HTN by RHC (3/25), CKD IV, Afib, CAD w/ HFref (3/23 EF: 25-30% vs recent baseline 40-45%), s/p TAVR (09/2016), T2DM, and hx of chronic GI bleed. CVP remains elevated. Trial of CRRT discontinuation today. Multiple interacting end stage conditions with little clinical maneuverability. Will attempt to establish GOC with patient and family today with palliative.    AKI on CKD IV Anuric AKI likely multifactorial etiology with components of prerenal and cardiorenal syndrome given decompensated HFrEF c/b Afib w/ RVR and microvascular disease (T2DM).  - Trial of CRRT w/ CVVHD discontinuation to determine kidney reserve    - CVP remains elevated - Maintain MAP >70 for optimal renal perfusion - Continue to trend renal function; consider svO2 PRN      - Most recent Coox remains w/in goal range, although decreased from previous  - Avoid further nephrotoxins including NSAIDS, Morphine. Unless absolutely necessary, avoid CT with contrast and/or MRI with gadolinium.   - Currently no indication for HD. However, not a good candidate long term iHD.  - Nephrology following, appreciate recommendations   Constipation   No BM recorded since 3/25 despite suppository on 3/30.  Little PO intake. Will CTM for early partial SBO.    - Miralax PRN  - Glycerin suppository PRN   - KUB 3/30 revealed diffuse gas pattern and moderate stool burden  Afib w/ RVR - s/p TEE guided DCCV - now with SR but bradycardiac  -  Amioradione gtt discontinued as of yesterday  Acute Excaberation on Chronic HFrEF, s/p TAVR (2017) CHF excerebration likely multifactorial with considerations towards worsening Afib w/ RVR and features of MR. NE weaned overnight of 3/30.   - As above, trial of discontinuation of CRRT  - As above, CVP remains elevated  - Determine management following GOC - Heart Failure following, appreciate recommendations  - Repleted K and Mg as indicated   OSH, COPD, Acute on Chronic Hypercapnic Respiratory Failure  Baseline O2 requirement at home 2L. Continue to require 4-5L while inpatient potentially related to posturing versus worsening CHF.  - Titrate O2 sats 88-92% on O2  - Patient refused CPAP, CTM  - Continue Breo Ellipta and Incruse Ellipta  - Pulmonology following, appreciate recommendations  C/f Sacral Decubitus Ulcer  - CTM / rotations   Chronic Conditions T2DM - Continue SSI while in ICU  - Continue home rosuvastatin   Hypothyroidism - Continue home Levo   Normocytic Anemia   Likely 2/2 chronic GI bleed, AICD, and CKD. Iron studies c/w AICD given elevated ferritin and low-normal TIBC (Fe wnl).  - Consider transfusion if <8 given need for optimal perfusion   Hx of GI Bleed - Occult chronic GI bleed with unidentified source by capsule (2019)  - CTM for evidence of change or acute hemorrhage while heparinized    Daily Goals Checklist  Pain/Anxiety/Delirium protocol (if indicated): Fentanyl & Tramadol added for PRN pain  VAP protocol (if indicated): N/A Respiratory support goals: discussed Blood pressure target: MAP > 70, discussed above DVT prophylaxis: maintain heparin dose Nutritional status and feeding goals: appetite discussed with  patient  GI prophylaxis: continue pantoprazole Fluid status goals: continue CRRT  Urinary catheter: Assessment of intravascular volume Central lines: in place, no signs of infection Glucose control: continue SSI Mobility/therapy needs: continue as tolerated Antibiotic de-escalation: N/A Home medication reconciliation: To be optimized Daily labs: continue CBC, CMP Code Status: Full Family Communication: Discussed with family at bedside Disposition: TBD  Critical care time: 45 minutes      Merrilee Seashore  Scarlette Ar, MS4     .

## 2021-01-25 NOTE — Progress Notes (Signed)
PT Cancellation Note  Patient Details Name: KC SEDLAK MRN: [id number] DOB: [birth year]   Cancelled Treatment:    Reason Eval/Treat Not Completed: Other (comment) (family in palliative meeting at present and RN reports pt just transferred from [facility] to bed with significant +2 assist and reports to hold therapy at this time and allow pt to be comfortable, awaiting decisions from meeting.)   Rupa Lagan B Letzy Gullickson 01/25/2021, 11:45 AM  Bayard Males, [place] Pager: [phone] Office: [phone]

## 2021-01-25 NOTE — Progress Notes (Signed)
[facility] Progress Note    Assessment/ Plan:   AKI on CKD 4 -Baseline creatinine seems to range around 1.6-2 with underlying proteinuria.  Suspect is a component of diabetic kidney disease and hypertensive arterionephrosclerosis -AKI likely related to cardiorenal syndrome.   -Initiated CRRT 3/27 via temp cath in setting of worsening renal function and volume overload refractory to max medical therapies (now off lasix due to lack of efficacy).  CRRT was running ok after addition of heparin until last night.  Sound like after she was moved up in bed catheter issues developed leading to circuit clotting x 2.  She has a palliative meeting with family at 11am so we will leave her off CRRT for the next few hours awaiting that discussion.  I don't want to cause another blood loss while we troubleshoot.     -Ongoing disucssion re: feasibility and utility of long term RRT in settting of numerous comorbidities; palliative care is aiding in discussion, appreciated.  We had been hopeful that off loading volume and cardioversion may restore renal perfusion and improve function to liberate from RRT or at least vasopressor support.  Unfortunately she's now anuric.  I discussed these issues with daughter bedside yesterday .  She seems understanding of poor prognosis.   -If we do resume CRRT will check CXR for catheter position prior  Acute decompensated biventricular failure -Cardio consulting, status post RHC 01/19/2021.  On NE (started after CRRT), off milrinone as of 3/29. CVVHD cont for UF -Palliative consulting  Afib w/ RVR -on amio gtt -s/p CV now in SB, off NE  Acute on chronic hypoxic and hypercapnic respiratory failure, history of OHS, COPD - improved with ultrafiltration  Hypotension, history of hypertension -Likely secondary to poor cardiac output , BPs are now low normal  DM2 with hyperglycemia -Management per primary service  Anemia of chronic disease and chronic IDA -  transfuse for hemoglobin less than 7, per primary  Subjective:   Issues with filter clotting x 2 overnight - seems like it was a positional catheter issue.  Blood loss with each episode.  Requiring pain medication for diffuse pain per RN.      Objective:   BP (!) 118/40   Pulse (!) 44   Temp 97.7 F (36.5 C)   Resp 14   Ht 5' 2" (1.575 m)   Wt 94.8 kg   SpO2 99%   BMI 38.23 kg/m   Intake/Output Summary (Last 24 hours) at 01/25/2021 1042 Last data filed at 01/25/2021 1000 Gross per 24 hour  Intake 1106.77 ml  Output 2329 ml  Net -1222.23 ml   Weight change: 0.6 kg  Physical Exam: [id number] comfortably after pain medication  CVS: reg, brady Resp: normal WOB at rest  Abd: Obese, soft, nontender Ext: trace pitting edema bilateral lower extremities Neuro: Sleeping  Imaging: DG Abd 1 View  Result Date: 01/23/2021 CLINICAL DATA:  Constipation EXAM: ABDOMEN - 1 VIEW COMPARISON:  CT abdomen and pelvis April 17, 2020. FINDINGS: There is moderate stool in the colon. There is mild dilatation in the proximal transverse colon region. No appreciable small bowel dilatation. No air-fluid levels. No free air or portal venous air. There are surgical clips in the gallbladder fossa region. There is aortic atherosclerotic calcification. IMPRESSION: Mild dilatation in the proximal transverse colon without air-fluid levels. No other bowel dilatation. Question a degree of enteritis or early ileus. Bowel obstruction not felt to be likely. No free air. Clips in right upper quadrant. Aortic Atherosclerosis (  ICD10-I70.0). Electronically Signed   By: Lowella Grip III M.D.   On: 01/23/2021 14:24    Labs: BMET Recent Labs  Lab 01/22/21 0448 01/22/21 1600 01/23/21 0425 01/23/21 1600 01/22/2021 0338 01/11/2021 1713 01/17/2021 2030 01/25/21 0503  NA 134* 132* 133* 135 133* 132*  --  134*  K 4.4 4.6 4.6 4.7 4.7 4.8  --  4.4  CL 100 97* 99 100 101 100  --  103  CO2 _0 --  27  GLUCOSE  205* 216* 177* 182* 162* 171*  --  178*  BUN _1 --  16  CREATININE 1.61* 1.54* 1.51* 1.51* 1.38* 1.53*  --  1.49*  CALCIUM 8.3* 8.3* 8.6* 8.5* 8.4* 8.5*  --  8.1*  PHOS 2.6 2.6 2.6 2.7 2.3* 1.9* 1.8* 2.9   CBC Recent Labs  Lab 01/22/21 0448 01/23/21 0425 12/25/2020 0338 01/25/21 0804  WBC 8.1 8.0 7.1 7.2  HGB 9.8* 10.1* 10.2* 9.7*  HCT 31.9* 34.3* 33.9* 33.1*  MCV 95.2 97.4 98.0 96.5  PLT 162 157 140* 128*    Medications:    . amiodarone  200 mg Oral Daily  . B-complex with vitamin C  1 tablet Oral Daily  . Chlorhexidine Gluconate Cloth  6 each Topical Daily  . feeding supplement  237 mL Oral TID BM  . fluticasone furoate-vilanterol  1 puff Inhalation Daily   And  . umeclidinium bromide  1 puff Inhalation Daily  . insulin aspart  0-5 Units Subcutaneous QHS  . insulin aspart  0-9 Units Subcutaneous TID WC  . levothyroxine  88 mcg Oral Q0600  . midodrine  15 mg Oral TID WC  . pantoprazole  40 mg Oral BID  . QUEtiapine  12.5 mg Oral QHS  . rosuvastatin  10 mg Oral Daily  . sodium chloride flush  10-40 mL Intracatheter Q12H  . sodium chloride flush  3 mL Intravenous Q12H  . sodium chloride flush  3 mL Intravenous Q12H      Jannifer Hick MD [facility] Pager [phone]

## 2021-01-25 NOTE — Progress Notes (Addendum)
Daily Progress Note   Patient Name: Patricia [name]       Date: 01/25/2021 DOB: 03-Apr-1936  Age: 85 y.o. MRN#: [id number] Attending Physician: Spero Geralds, MD Primary Care Physician: [name], NP Admit Date: 12/28/2020  Reason for Consultation/Follow-up: Establishing goals of care  Subjective: Chart review performed. Received report from primary RN - no acute concerns. RN helped patient up to [facility] and reports patient is extremely weak. Patient is now off CRRT due to clotting issues - waiting for PMT meeting this morning for [place] before restarting. RN reports patient is not engaged much in conversation, not eating/drinking, lethargic.    Went to visit patient at bedside - daughter/Patricia [name] and her husband/Patricia [name] were present. I spoke with them briefly in the hallway and went to see patient alone, per daughter preference. Patient was lying in bed with her eyes closed - she does minimally arouse to voice/gentle touch but does not open her eyes. She is oriented to self/place but not time/situation. No signs or non-verbal gestures of pain or discomfort noted. No respiratory distress, increased work of breathing, or secretions noted. Patient states her back hurts and is having some increased shortness of breath. Ms. Montesano reports not having an appetite. I ask how she feels and she responds "not good." Patient is slow to answer questions.  I attempted to discuss complex medical decisions/plans today and allowed time and space for patient to answer after each question. I attempted to discussed her failing kidneys, CRRT, if she would want to be placed back on CRRT or be kept comfortable. Patient does not respond to most complex medical questions, and when she did she stated "I don't know." All she can tell  me is her back hurts today. At this time, I do not believe patient is able to make complex medical decisions for herself.   Dr. Shearon Stalls entered room and agreed with assessment.  We discussed this with patient's daughter/Patricia [name] and SIL/Patricia [name]. Patricia [name] understand we now look to her to make medical decisions for patient. We discussed DNR/DNI in context of patient's current medical situation - Patricia [name] is agreeable for DNR/DNI. I spoke with Patricia [name] and Patricia [name] in conference room to discuss next steps. We talked again about transition to comfort measures in house and what that would entail inclusive of medications to control pain,  dyspnea, agitation, nausea, itching, and hiccups. We discussed stopping all unnecessary measures such as blood draws, needle sticks, oxygen, antibiotics, CBGs/insulin, cardiac monitoring, and frequent vital signs. Patricia [name] is agreeable to full comfort measures today and requests transfer to [facility]. We discussed natural trajectory and expectations for renal failure and EOL.   Educated on current [facility] EOL visitation policy - family expressed understanding.   Therapeutic listening and emotional support provided to family.  All questions and concerns addressed. Encouraged to call with questions and/or concerns. PMT card provided.   Length of Stay: 9  Current Medications: Scheduled Meds:  . amiodarone  200 mg Oral Daily  . antiseptic oral rinse  15 mL Topical BID  . fluticasone furoate-vilanterol  1 puff Inhalation Daily   And  . umeclidinium bromide  1 puff Inhalation Daily  . QUEtiapine  12.5 mg Oral QHS  . sodium chloride flush  10-40 mL Intracatheter Q12H  . sodium chloride flush  3 mL Intravenous Q12H  . sodium chloride flush  3 mL Intravenous Q12H    Continuous Infusions: . sodium chloride Stopped (01/25/21 0922)    PRN Meds: sodium chloride, acetaminophen **OR** acetaminophen, albuterol, azelastine, dextromethorphan-guaiFENesin, docusate sodium, feeding supplement, fentaNYL  (SUBLIMAZE) injection, Glycerin (Adult), glycopyrrolate **OR** glycopyrrolate **OR** glycopyrrolate, haloperidol **OR** haloperidol **OR** haloperidol lactate, LORazepam **OR** LORazepam **OR** LORazepam, Muscle Rub, ondansetron **OR** ondansetron (ZOFRAN) IV, polyethylene glycol, polyvinyl alcohol, sodium chloride flush, sodium chloride flush, traMADol  Physical Exam Vitals and nursing note reviewed.  Constitutional:      General: She is not in acute distress.    Appearance: She is ill-appearing.  Pulmonary:     Effort: No respiratory distress.  Skin:    General: Skin is warm and dry.  Neurological:     Mental Status: She is lethargic and confused.     Motor: Weakness present.  Psychiatric:        Behavior: Behavior is slowed. Behavior is cooperative.        Cognition and Memory: Cognition is impaired. Memory is impaired.             Vital Signs: BP (!) 134/44 (BP Location: Right Wrist)   Pulse (!) 47   Temp 98 F (36.7 C)   Resp 17   Ht 5' 2" (1.575 m)   Wt 94.8 kg   SpO2 91%   BMI 38.23 kg/m  SpO2: SpO2: 91 % O2 Device: O2 Device: Nasal Cannula O2 Flow Rate: O2 Flow Rate (L/min): 3 L/min  Intake/output summary:   Intake/Output Summary (Last 24 hours) at 01/25/2021 1250 Last data filed at 01/25/2021 1200 Gross per 24 hour  Intake 882.05 ml  Output 2074 ml  Net -1191.95 ml   LBM: Last BM Date: 02/18/21 Baseline Weight: Weight: 102.9 kg Most recent weight: Weight: 94.8 kg       Palliative Assessment/Data: PPS 20%      Patient Active Problem List   Diagnosis Date Noted  . Pressure ulcer of coccygeal region 01/21/2021  . Heart failure with preserved ejection fraction ([place])   . CHF exacerbation ([place]) 01/16/2021  . Acute on chronic systolic (congestive) heart failure ([place]) 12/31/2020  . Hyperlipidemia associated with type 2 diabetes mellitus ([place]) 01/06/2021  . Abnormal CT of the chest 06/17/2020  . Chronic respiratory failure with hypoxia ([place]) 03/02/2020  .  Rhinitis 03/02/2020  . Pressure injury of skin 07/26/2019  . Dyspnea 07/26/2019  . Acute on chronic diastolic (congestive) heart failure ([place]) 07/25/2019  . Acute diastolic CHF (  congestive heart failure) ([place]) 07/25/2019  . COPD with acute exacerbation ([place]) 03/17/2019  . GI bleed 01/20/2018  . UGIB (upper gastrointestinal bleed) 03/11/2017  . AKI (acute kidney injury) ([place]) 03/11/2017  . Lung nodule 01/26/2017  . Macrocytosis 01/20/2017  . Hypoxemia 10/24/2016  . Asthma 10/24/2016  . Hypersomnia 10/24/2016  . Obesity hypoventilation syndrome ([place]) 10/24/2016  . 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 Acute on chronic respiratory failure with hypoxia ([place])   . Aortic valve stenosis 09/11/2016  . Acute blood loss anemia 02/06/2016  . Acute on chronic diastolic heart failure ([place]) 02/06/2016  . Paroxysmal atrial fibrillation ([place]) 02/05/2016  . Primary localized osteoarthrosis of right shoulder 12/18/2015  . Osteoarthritis of right shoulder 12/18/2015  . Post-nasal drainage 11/27/2015  . Anemia 09/26/2015  . Neck pain 05/23/2015  . Knee pain, bilateral 07/20/2014  . Lumbago 11/02/2013  . Trigger finger, acquired 11/02/2013  . Hyperlipidemia 11/02/2013  . Urinary incontinence 11/02/2013  . Rash and nonspecific skin eruption 11/02/2013  . COPD (chronic obstructive pulmonary disease) ([place])   . Hypothyroidism   . Hypertension associated with diabetes ([place]) 03/02/2013  . DM type 2 (diabetes mellitus, type 2) ([place]) 03/02/2013    Palliative Care Assessment & Plan   Patient Profile: 85 y.o.femalewith past medical history of chronic systolic CHF, chronic respiratory failure with hypoxia on 2L oxygen at home, paroxysmal atrial fibrillation, COPD, s/p TAVR, CKD stage IV, type 2 DM, HTN, hypothyroidism, HLD, LBBB, and OHS not on CPAP. She presented to the emergency departmenton3/22/2022with shortness of breath.In the ED, chest x-ray with pulmonary edema and  pleural effusions. BNP 1600. Admitted to [facility] for acute on chronic systolic CHF. Cardiology and PCCM consulted.  Echo 3/23 shows worsening EF (25-30% from prior of 40-45% in January 2021).  Assessment: AKI on CKD stage IV Acute decompensation biventricular failure Atrial fibrillation with RVR Acute on chronic hypoxic respiratory failure History of COPD Cardiorenal syndrom Terminal care  Recommendations/Plan: Initiated full comfort measures - prognosis likely days Initiated DNR/DNI - durable DNR form completed and placed in shadow chart; copy made and will be scanned into Vynca/ACP tab Transfer to [facility] when bed available - TOC and [place] liaison notified of family's request for BP; TOC consult placed Added orders for EOL symptom management and to reflect full comfort measures, as well as discontinued orders that were not focused on comfort Unrestricted visitation orders were placed per current [place] EOL visitation policy  Allow patient's two dogs to visit per [facility] animal visitation policy - nursing to facilitate  Nursing to provide frequent assessments and administer PRN medications as clinically necessary to ensure EOL comfort PMT will continue to follow and support holistically  Goals of Care and Additional Recommendations:  Limitations on Scope of Treatment: Full Comfort Care  Code Status:    Code Status Orders  (From admission, onward)         Start     Ordered   01/25/21 1234  Do not attempt resuscitation (DNR)  Continuous       Question Answer Comment  In the event of cardiac or respiratory ARREST Do not call a "code blue"   In the event of cardiac or respiratory ARREST Do not perform Intubation, CPR, defibrillation or ACLS   In the event of cardiac or respiratory ARREST Use medication by any route, position, wound care, and other measures to relive pain and suffering. May use oxygen, suction and manual treatment of airway obstruction as needed for  comfort.      01/25/21 1246        Code Status History    Date Active Date Inactive Code Status Order ID Comments User Context   01/25/2021 0172 01/25/2021 1246 DNR [id number]  Lin Landsman, NP Inpatient   01/23/2021 2323 01/25/2021 1145 Full Code [id number]  Lenore Cordia, MD ED   07/25/2019 2142 07/29/2019 1818 Full Code [id number]  Rise Patience, MD ED   01/20/2018 2057 01/26/2018 1701 Full Code [id number]  Karmen Bongo, MD ED   11/12/2017 1818 11/13/2017 2036 Full Code [id number]  Tommye Standard, Lorelee Cover., PA-C Inpatient   03/11/2017 2141 03/13/2017 1838 Full Code [id number]  Etta Quill, DO ED   10/14/2016 1017 10/16/2016 1632 Full Code [id number]  Rexene Alberts, MD Inpatient   09/11/2016 2249 09/14/2016 1836 Full Code [id number]  Karmen Bongo, MD Inpatient   09/02/2016 1152 09/02/2016 2109 Full Code [id number]  [place], [name] M, MD Inpatient   10/19/2015 0211 10/21/2015 1915 Full Code [id number]  Alfonso Ramus, MD Inpatient   Advance Care Planning Activity       Prognosis:   Hours - Days  Discharge Planning:  Hospice facility  Care plan was discussed with primary RN, patient, patient's family, Dr. Shearon Stalls, [facility] liaison   Thank you for allowing the Palliative Medicine Team to assist in the care of this patient.   Total Time 70 minutes Prolonged Time Billed  yes       Greater than 50%  of this time was spent counseling and coordinating care related to the above assessment and plan.  Lin Landsman, NP  Please contact Palliative Medicine Team phone at [phone] for questions and concerns.

## 2021-01-25 DEATH — deceased

## 2021-01-26 DIAGNOSIS — Z515 Encounter for palliative care: Secondary | ICD-10-CM | POA: Diagnosis not present

## 2021-01-26 DIAGNOSIS — I5023 Acute on chronic systolic (congestive) heart failure: Secondary | ICD-10-CM | POA: Diagnosis not present

## 2021-01-26 DIAGNOSIS — I480 Paroxysmal atrial fibrillation: Secondary | ICD-10-CM | POA: Diagnosis not present

## 2021-01-26 DIAGNOSIS — I509 Heart failure, unspecified: Secondary | ICD-10-CM | POA: Diagnosis not present

## 2021-01-26 DIAGNOSIS — Z7189 Other specified counseling: Secondary | ICD-10-CM | POA: Diagnosis not present

## 2021-01-26 DIAGNOSIS — J9621 Acute and chronic respiratory failure with hypoxia: Secondary | ICD-10-CM | POA: Diagnosis not present

## 2021-01-26 DIAGNOSIS — N179 Acute kidney failure, unspecified: Secondary | ICD-10-CM | POA: Diagnosis not present

## 2021-01-26 MED ORDER — LORAZEPAM 2 MG/ML IJ SOLN: 2.0000 mg | INTRAMUSCULAR | Status: DC | PRN

## 2021-01-26 MED ORDER — MIDAZOLAM BOLUS VIA INFUSION (WITHDRAWAL LIFE SUSTAINING TX)
2.0000 mg | INTRAVENOUS | Status: DC | PRN
  Filled 2021-01-26: qty 2

## 2021-01-26 MED ORDER — MIDAZOLAM 50MG/50ML (1MG/ML) PREMIX INFUSION
0.0000 mg/h | INTRAVENOUS | Status: DC
  Administered 2021-01-26: 1 mg/h via INTRAVENOUS
  Filled 2021-01-26: qty 50

## 2021-01-26 MED ORDER — FENTANYL BOLUS VIA INFUSION
100.0000 ug | INTRAVENOUS | Status: DC | PRN
  Administered 2021-01-26: 100 ug via INTRAVENOUS
  Filled 2021-01-26: qty 100

## 2021-01-26 MED ORDER — MORPHINE BOLUS VIA INFUSION
5.0000 mg | INTRAVENOUS | Status: DC | PRN
  Filled 2021-01-26: qty 5

## 2021-01-26 MED ORDER — MORPHINE SULFATE (PF) 2 MG/ML IV SOLN: 2.0000 mg | INTRAVENOUS | Status: DC | PRN

## 2021-01-26 MED ORDER — MIDAZOLAM HCL 2 MG/2ML IJ SOLN
1.0000 mg | INTRAMUSCULAR | Status: DC | PRN
Start: 2021-01-26 — End: 2021-01-26

## 2021-01-26 MED ORDER — MORPHINE 100MG IN NS 100ML (1MG/ML) PREMIX INFUSION
0.0000 mg/h | INTRAVENOUS | Status: DC
  Administered 2021-01-26: 5 mg/h via INTRAVENOUS
  Filled 2021-01-26: qty 100

## 2021-01-26 MED ORDER — FENTANYL 2500MCG IN NS 250ML (10MCG/ML) PREMIX INFUSION
0.0000 ug/h | INTRAVENOUS | Status: DC
  Administered 2021-01-26: 50 ug/h via INTRAVENOUS
  Filled 2021-01-26: qty 250

## 2021-01-26 MED ORDER — FENTANYL CITRATE (PF) 100 MCG/2ML IJ SOLN
50.0000 ug | INTRAMUSCULAR | Status: DC | PRN
Start: 2021-01-26 — End: 2021-01-26

## 2021-01-26 NOTE — Progress Notes (Signed)
Daily Progress Note   Patient Name: Patricia [name]       Date: 01/26/2021 DOB: 1936-07-04  Age: 85 y.o. MRN#: [id number] Attending Physician: Spero Geralds, MD Primary Care Physician: [name], NP Admit Date: 01/04/2021  Reason for Consultation/Follow-up: symptom check, end of life care  Subjective: Patient is on morphine infusion and 5 mg/hr and midazolam infusion at 1 mg/hr.   Patient appears comfortable. No non-verbal signs of pain or discomfort noted. Respirations are even and unlabored. No excessive respiratory secretions noted. Several family members present at bedside. [name] reports patient has intermittently been responsive to family members today.  Education and counseling provided on expectations at EOL. Emotional support provided.   Discussed that prognosis is likely days and she appeared stable for transport at the current time. Recommended leaving her on the list for [facility] for now, but provided reassurance this could be cancelled at any time if her condition changed and it was felt she was too unstable for transfer.    Length of Stay: 10  Current Medications: Scheduled Meds:  . sodium chloride flush  10-40 mL Intracatheter Q12H    Continuous Infusions: . midazolam 1 mg/hr (01/26/21 0903)  . morphine 5 mg/hr (01/26/21 1229)    PRN Meds: acetaminophen **OR** acetaminophen, albuterol, docusate sodium, glycopyrrolate **OR** glycopyrrolate **OR** glycopyrrolate, haloperidol **OR** haloperidol **OR** haloperidol lactate, LORazepam **OR** LORazepam **OR** LORazepam, LORazepam, morphine injection, morphine, Muscle Rub, ondansetron **OR** ondansetron (ZOFRAN) IV, polyvinyl alcohol, sodium chloride flush       Vital Signs: BP (!) 136/43 (BP Location:  Right Wrist)   Pulse (!) 45   Temp 99.2 F (37.3 C) (Oral)   Resp 15   Ht _0  (1.575 m)   Wt 94.8 kg   SpO2 100%   BMI 38.23 kg/m  SpO2: SpO2: 100 % O2 Device: O2 Device: Nasal Cannula O2 Flow Rate: O2 Flow Rate (L/min): 3 L/min  Intake/output summary:   Intake/Output Summary (Last 24 hours) at 01/26/2021 1546 Last data filed at 01/26/2021 1300 Gross per 24 hour  Intake 3 ml  Output 0 ml  Net 3 ml   LBM: Last BM Date: 02/18/21 Baseline Weight: Weight: 102.9 kg Most recent weight: Weight: 94.8 kg       Palliative Assessment/Data: PPS 10%      Palliative  Care Assessment & Plan   Patient Profile: 85 y.o.femalewith past medical history of chronic systolic CHF, chronic respiratory failure with hypoxia on 2L oxygen at home, paroxysmal atrial fibrillation, COPD, s/p TAVR, CKD stage IV, type 2 DM, HTN, hypothyroidism, HLD, LBBB, and OHS not on CPAP. She presented to the emergency departmenton3/22/2022with shortness of breath.In the ED, chest x-ray with pulmonary edema and pleural effusions. BNP 1600. Admitted to [facility] for acute on chronic systolic CHF. Cardiology and PCCM consulted.  Echo 3/23 shows worsening EF (25-30% from prior of 40-45% in January 2021).  Assessment: AKI on CKD stage IV Acute decompensation biventricular failure Atrial fibrillation with RVR Acute on chronic hypoxic respiratory failure History of COPD Cardiorenal syndrome Terminal care  Recommendations/Plan:  DNR/DNI as previously documented   Continue full comfort measures  Continue morphine infusion - appreciate PCCM starting this  Continue midazolam (VERSED) infusion  PMT will continue to follow  Goals of Care and Additional Recommendations:  Limitations on Scope of Treatment: Full Comfort Care  Code Status: DNR/DNI  Prognosis:  days  Discharge Planning:  Anticipated hospital death versus residential hospice    Thank you for allowing the Palliative Medicine Team to assist  in the care of this patient.   Total Time 15 minutes Prolonged Time Billed  no       Greater than 50%  of this time was spent counseling and coordinating care related to the above assessment and plan.  Lavena Bullion, NP  Please contact Palliative Medicine Team phone at [phone] for questions and concerns.

## 2021-01-26 NOTE — Progress Notes (Signed)
A bag of Fentanyl approximately 276m wasted in [employer] witnessed by [employer]

## 2021-01-26 NOTE — Progress Notes (Signed)
Patient ID: Patricia [name], female   DOB: 1936/07/25, 85 y.o.   MRN: [id number]  Patient has been transitioned to full comfort measures and will transfer to [facility]  Cardiology will sign off, call with questions.   [name] 01/26/2021

## 2021-01-26 NOTE — Progress Notes (Addendum)
NAME:  Patricia [name], MRN:  [id number], DOB:  04/17/1936, LOS: 73 ADMISSION DATE:  01/05/2021, CONSULTATION DATE:  01/26/21 REFERRING MD:  Starla Link - TRH, CHIEF COMPLAINT:  SOB  HPI/course in hospital  85 yo F with a relevant PMH of OSH c/b COPD and hypoxia (baseline home O2 2L) with pulmonary HTN by [facility] (3/25), CKD IV, Afib, CAD w/ HFref (3/23 EF: 25-30% vs recent baseline 40-45%), s/p TAVR (09/2016), T2DM, and hx of chronic GI bleed.   On admission, patient described increased SOB on 2L O2 at home, bilateral lower extremity pitting edema, and fluctuating/elevated HR. Describes process as "long time in making". BNP 1681, CXR with bilateral pleural effusions and pulmonary edema. Admitted to [facility] for acute heart failure exacerbation. Troponin trend at that time not c/w ACS. Got 74m lasix in ED, and total of 1469mLasix 3/23, lasix w/ metolazone, without a robust response. ECHO obtained 3/23 which revealed LVEF 25-30%, LVH, reduced RV systolic function but normal RV size. Severely dilated LA. Moderately dilated RA.    Following TEE DCCV (3/31) patient has bradycardiac sinus rhythm but remains anuric. Discussions with family and palliative care are ongoing to establish goals of care given ESRD and poor candidacy for HD.    3/24 Cr has bumped and lasix being held. Cardiology suggests PCCM consult to evaluate SOB for pulmonary etiology of SOB. 3/26: Transfer to ICU for initiation of CRRT 3/27: CRRT w/ CVHHD 3/28: continue CVVHD for kidney recover and euvolemia  3/29: weight 98.5 >> 95.7 kg, increased inotropic support required ON   3/30: weight 98.5 >> 93.8 kg, appears euvolemic, increased inotropic support required ON   3/31: TEE DCCV successful with sinus rhythm. Bradycardiac. Off NE support.  4/1: CRRT discontinuation trial   Past Medical History   Past Medical History:  Diagnosis Date  . Allergy   . Anemia, unspecified   . Arthritis   . Asthma   . At[facility]   s/p TEE/DCCV>>SR on amio  . Benign neoplasm of colon   . Carpal tunnel syndrome   . Cellulitis and abscess of finger, unspecified   . Cerumen impaction   . Cervicalgia   . CHF (congestive heart failure) [place]  . Chronic airway obstruction, not elsewhere classified   . Chronic anticoagulation 09/2015   Eliquis  . Diarrhea   . Diffuse cystic mastopathy   . Edema   . Encounter for long-term (current) use of other medications   . GERD (gastroesophageal reflux disease)   . Heart murmur   . Lumbago   . Neck pain 05/23/2015  . Obesity, unspecified   . Other and unspecified hyperlipidemia   . Other psoriasis   . Pain in joint, lower leg   . PONV (postoperative nausea and vomiting)   . Primary localized osteoarthrosis of right shoulder 12/18/2015  . 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 Scoliosis (and kyphoscoliosis), idiopathic   . Shortness of breath dyspnea    with exertion  . Type II or unspecified type diabetes mellitus without mention of complication, uncontrolled   . Unspecified essential hypertension   . Unspecified hemorrhoids without mention of complication   . Unspecified hypothyroidism   . Urge incontinence      Past Surgical History:  Procedure Laterality Date  . ABDOMINAL HYSTERECTOMY  1987   complete  . BREAST BIOPSY Right   .  BREAST EXCISIONAL BIOPSY Left 2011  . BREAST EXCISIONAL BIOPSY Right    x2  . BREAST SURGERY     right breast 3 surgeries [phone]  . CARDIAC CATHETERIZATION N/A 09/02/2016   Procedure: Right/Left Heart Cath and Coronary Angiography;  Surgeon: [name] M Martinique, MD;  Location: [place] CV LAB;  Service: Cardiovascular;  Laterality: N/A;  . CARDIOVERSION N/A 10/19/2015   Procedure: CARDIOVERSION;  Surgeon: Lelon Perla, MD;  Location: [facility] ENDOSCOPY;  Service: Cardiovascular;   Laterality: N/A;  . CARDIOVERSION N/A 11/13/2017   Procedure: CARDIOVERSION;  Surgeon: Jerline Pain, MD;  Location: [facility] ENDOSCOPY;  Service: Cardiovascular;  Laterality: N/A;  . CARDIOVERSION N/A 07/28/2019   Procedure: CARDIOVERSION;  Surgeon: Buford Dresser, MD;  Location: [facility] ENDOSCOPY;  Service: Cardiovascular;  Laterality: N/A;  . CARDIOVERSION N/A 01/14/2021   Procedure: CARDIOVERSION;  Surgeon: Larey Dresser, MD;  Location: [facility] ENDOSCOPY;  Service: Cardiovascular;  Laterality: N/A;  . [place]  . COLON SURGERY     2004,2007,2013.  3 times colon surgieres  . ESOPHAGOGASTRODUODENOSCOPY (EGD) WITH PROPOFOL Left 03/13/2017   Procedure: ESOPHAGOGASTRODUODENOSCOPY (EGD) WITH PROPOFOL;  Surgeon: Ronnette Juniper, MD;  Location: [place];  Service: Gastroenterology;  Laterality: Left;  . ESOPHAGOGASTRODUODENOSCOPY (EGD) WITH PROPOFOL Left 01/22/2018   Procedure: ESOPHAGOGASTRODUODENOSCOPY (EGD) WITH PROPOFOL;  Surgeon: Arta Silence, MD;  Location: [facility] ENDOSCOPY;  Service: Endoscopy;  Laterality: Left;  . EXCISE LE MANDIBULAR LYMPH NODE T     DR C. NEWMAN  . FOOT SURGERY  1989  . GIVENS CAPSULE STUDY Left 01/23/2018   Procedure: GIVENS CAPSULE STUDY;  Surgeon: Wilford Corner, MD;  Location: [facility] ENDOSCOPY;  Service: Endoscopy;  Laterality: Left;  . LEFT SHOULDER ARTHROSCOPY    . 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 NEUROPLASTY / TRANSPOSITION MEDIAN NERVE AT CARPAL TUNNEL BILATERAL  05/2003  . RIGHT HEART CATH N/A 01/16/2021   Procedure: RIGHT HEART CATH;  Surgeon: Leonie Man, MD;  Location: [place] CV LAB;  Service: Cardiovascular;  Laterality: N/A;  . TEE WITHOUT CARDIOVERSION N/A 10/19/2015   Procedure: Transesophageal Echocardiogram (TEE) ;  Surgeon: Lelon Perla, MD;  Location: [facility] ENDOSCOPY;  Service: Cardiovascular;  Laterality: N/A;  . TEE WITHOUT CARDIOVERSION N/A 10/14/2016   Procedure: TRANSESOPHAGEAL ECHOCARDIOGRAM (TEE);  Surgeon: Sherren Mocha, MD;  Location: [place];  Service: Open Heart Surgery;  Laterality: N/A;  . TEE WITHOUT CARDIOVERSION N/A 07/28/2019   Procedure: TRANSESOPHAGEAL ECHOCARDIOGRAM (TEE);  Surgeon: Buford Dresser, MD;  Location: [facility] ENDOSCOPY;  Service: Cardiovascular;  Laterality: N/A;  . TEE WITHOUT CARDIOVERSION N/A 01/23/2021   Procedure: TRANSESOPHAGEAL ECHOCARDIOGRAM (TEE);  Surgeon: Larey Dresser, MD;  Location: [facility] ENDOSCOPY;  Service: Cardiovascular;  Laterality: N/A;  . TOTAL SHOULDER ARTHROPLASTY Right 12/18/2015   Procedure: TOTAL SHOULDER ARTHROPLASTY;  Surgeon: [name], MD;  Location: [place];  Service: Orthopedics;  Laterality: Right;  . TRANSCATHETER AORTIC VALVE REPLACEMENT, TRANSFEMORAL N/A 10/14/2016   Procedure: TRANSCATHETER AORTIC VALVE REPLACEMENT, TRANSFEMORAL;  Surgeon: Sherren Mocha, MD;  Location: [place];  Service: Open Heart Surgery;  Laterality: N/A;  . TRIGGER FINGER RELEASE Left   . VAGINAL CYST REMOVED  1967     Interim history/subjective:  Overnight was experiencing pain. Has been more somnolent since about 0300  Periods of apnea overnight and ongoing accessory muscle use   Family was able to gather at bedside yesterday evening and shared a meaningful visit with the patient. This morning I spoke with the daughter about 4  beloved dogs and the meaningful people in [place] life.    Objective   Blood pressure (!) 136/43, pulse (!) 45, temperature 99.2 F (37.3 C), temperature source Oral, resp. rate 15, height _0  (1.575 m), weight 94.8 kg, SpO2 100 %. CVP:  [22 mmHg] 22 mmHg      Intake/Output Summary (Last 24 hours) at 01/26/2021 0728 Last data filed at 01/26/2021 0520 Gross per 24 hour  Intake 237.05 ml  Output 0 ml  Net 237.05 ml   Filed Weights   01/23/21 0500 01/13/2021 0500 01/25/21 0500  Weight: 93.8 kg 94.2 kg 94.8 kg    Examination: Gen: Elderly F in bed, frail CV: bradycardic. Thready peripheral pulses Pulm: Increased respiratory effort with  accessory muscle recruitment, some periods of apnea  GI: soft round  Neuro: somnolent, doesn't open eyes to stimulation. Does intermittently groan and grimace spontaneously and winces when she is touched  Skin: pale, clean, dry    Ancillary tests (personally reviewed)  CBC: Recent Labs  Lab 01/21/21 0357 01/22/21 0448 01/23/21 0425 01/23/2021 0338 01/25/21 0804  WBC 6.4 8.1 8.0 7.1 7.2  HGB 9.3* 9.8* 10.1* 10.2* 9.7*  HCT 30.0* 31.9* 34.3* 33.9* 33.1*  MCV 93.8 95.2 97.4 98.0 96.5  PLT 144* 162 157 140* 128*    Basic Metabolic Panel: Recent Labs  Lab 01/21/21 0357 01/21/21 1644 01/22/21 0448 01/22/21 1600 01/23/21 0425 01/23/21 1600 01/06/2021 0338 01/11/2021 1713 01/19/2021 2030 01/25/21 0503  NA 134*   < > 134*   < > 133* 135 133* 132*  --  134*  K 3.6   < > 4.4   < > 4.6 4.7 4.7 4.8  --  4.4  CL 96*   < > 100   < > 99 100 101 100  --  103  CO2 29   < > 29   < > _1 --  27  GLUCOSE 172*   < > 205*   < > 177* 182* 162* 171*  --  178*  BUN 45*   < > 19   < > _2 --  16  CREATININE 2.03*   < > 1.61*   < > 1.51* 1.51* 1.38* 1.53*  --  1.49*  CALCIUM 8.1*   < > 8.3*   < > 8.6* 8.5* 8.4* 8.5*  --  8.1*  MG 2.1  --  2.4  --  2.4  --  2.4  --   --  2.4  PHOS 2.9   < > 2.6   < > 2.6 2.7 2.3* 1.9* 1.8* 2.9   < > = values in this interval not displayed.   GFR: Estimated Creatinine Clearance: 31.2 mL/min (A) (by C-G formula based on SCr of 1.49 mg/dL (H)). Recent Labs  Lab 01/22/21 0448 01/23/21 0425 01/05/2021 0338 01/25/21 0804  WBC 8.1 8.0 7.1 7.2    Liver Function Tests: Recent Labs  Lab 01/23/21 0425 01/23/21 1600 01/08/2021 0338 12/27/2020 1713 01/25/21 0503  ALBUMIN 3.4* 3.4* 3.3* 3.5 3.4*   No results for input(s): LIPASE, AMYLASE in the last 168 hours. No results for input(s): AMMONIA in the last 168 hours.  ABG    Component Value Date/Time   PHART 7.366 06/12/2020 1018   PCO2ART 57.9 (H) 06/12/2020 1018   PO2ART 84.9 06/12/2020 1018    HCO3 35.3 (H) 01/06/2021 1543   HCO3 34.5 (H) 12/29/2020 1543   TCO2 37 (H) 01/04/2021 1543   TCO2  37 (H) 01/17/2021 1543   O2SAT 62.9 01/25/2021 0503     Coagulation Profile: Recent Labs  Lab 01/22/2021 0338  INR 1.3*    Cardiac Enzymes: No results for input(s): CKTOTAL, CKMB, CKMBINDEX, TROPONINI in the last 168 hours.  HbA1C: Hgb A1c MFr Bld  Date/Time Value Ref Range Status  01/16/2021 04:37 AM 7.1 (H) 4.8 - 5.6 % Final    Comment:    (NOTE) Pre diabetes:          5.7%-6.4%  Diabetes:              >6.4%  Glycemic control for   <7.0% adults with diabetes   09/17/2020 12:00 AM 7.3 (H) <5.7 % of total Hgb Final    Comment:    For someone without known diabetes, a hemoglobin A1c value of 6.5% or greater indicates that they may have  diabetes and this should be confirmed with a follow-up  test. . For someone with known diabetes, a value <7% indicates  that their diabetes is well controlled and a value  greater than or equal to 7% indicates suboptimal  control. A1c targets should be individualized based on  duration of diabetes, age, comorbid conditions, and  other considerations. . Currently, no consensus exists regarding use of hemoglobin A1c for diagnosis of diabetes for children. .     CBG: Recent Labs  Lab 01/13/2021 1124 12/31/2020 1551 12/25/2020 2129 01/25/21 0640 01/25/21 1126  GLUCAP 155* 188* 137* 157* 151*     Assessment & Plan:    Acute on chronic multisystem organ failures: AKI on CKD IV  Acute on chronic CHF, HFrEF, AS s/p TAVR, Afib s/p DCCV, pulmonary hypertension  COPD, chronic hypoxic failure, OSA unable to tolerate NPPV due to severe claustrophobia P -compassionately transitioned to comfort care -dc [place] -Resp sx appear under-treated at this time with significant accessory muscle use which has been ongoing since late 4/1 per family. On exam I am also concerned that she is still experiencing pain.  -Adding Morphine gtt and Ativan (added  fentanyl infusion and versed infusion, changing to morphine / ativan per primary physician request) Titrate for goal of respiratory sx alleviation, anxiolysis and analgesia.  -the family would prefer NOT to transfer out of hospital for hospice but is under the impression they are required to -- I have paged PCM to clarify. I think it is likely reasonable to remain in hospital for EOL if that is more consistent with family's wishes. At the least would like to see how the patient does with more optimized symptom management while in hospital.   Other chronic conditions:  Anxiety DM2 Hypothyroidism Hx GIB, unable to tolerate long term anticoag for Afib  -comfort measures only   Daily Goals Checklist  Pain/Anxiety: fentanyl gtt and versed gtt, titrate for optimized analgesia, anxiolysis and alleviation of respiratory symptoms. Code Status: DNR  Lines/tubes/drains: continue present l/t/d for EOL care  Family Communication: Daughter at bedside  Disposition: Hospice-- in hospital end of life vs. Discharge to hospice facility   CCT: n/a    Eliseo Gum MSN, AGACNP-BC [place] for pager details 01/26/2021, 8:44 AM      .

## 2021-01-26 NOTE — Plan of Care (Signed)
  Problem: Education: Goal: Knowledge of General Education information will improve Description: Including pain rating scale, medication(s)/side effects and non-pharmacologic comfort measures Outcome: Progressing   Problem: Health Behavior/Discharge Planning: Goal: Ability to manage health-related needs will improve Outcome: Progressing   Problem: Clinical Measurements: Goal: Ability to maintain clinical measurements within normal limits will improve Outcome: Progressing Goal: Respiratory complications will improve Outcome: Progressing   Problem: Pain Managment: Goal: General experience of comfort will improve Outcome: Progressing   Problem: Safety: Goal: Ability to remain free from injury will improve Outcome: Progressing   Problem: Skin Integrity: Goal: Risk for impaired skin integrity will decrease Outcome: Progressing

## 2021-01-28 DIAGNOSIS — J449 Chronic obstructive pulmonary disease, unspecified: Secondary | ICD-10-CM | POA: Diagnosis not present

## 2021-01-28 LAB — PATHOLOGIST SMEAR REVIEW

## 2021-02-19 ENCOUNTER — Ambulatory Visit: Payer: Medicare Other | Admitting: Cardiology

## 2021-02-19 LAB — ECHO TEE
AV Mean grad: 15 mmHg
AV Peak grad: 26.5 mmHg
Ao pk vel: 2.58 m/s

## 2021-02-24 NOTE — Progress Notes (Signed)
Wasted approximately 64m of mophine and 248mof versed in stericycle with ImDian QueenRN

## 2021-02-24 NOTE — Death Summary Note (Signed)
DEATH SUMMARY   Patient Details  Name: Patricia [name] MRN: [id number] DOB: May 10, 1936  Admission/Discharge Information   Admit Date:  01/21/21  Date of Death: Date of Death: 02-02-2021  Time of Death: Time of Death: 12/26/2022  Length of Stay: 2022-12-13  Referring Physician: [name], NP   Reason(s) for Hospitalization  Shortness of breath  Diagnoses  Preliminary cause of death: Congestive Heart Failure, Chronic Kidney Disease Secondary Diagnoses (including complications and co-morbidities):  Principal Problem:   Acute on chronic systolic (congestive) heart failure (HCC) Active Problems:   Hypertension associated with diabetes ([place])   DM type 2 (diabetes mellitus, type 2) (HCC)   Hypothyroidism   COPD (chronic obstructive pulmonary disease) (HCC)   Paroxysmal atrial fibrillation (HCC)   Acute on chronic respiratory failure with hypoxia (HCC)   S/P TAVR (transcatheter aortic valve replacement)   Obesity hypoventilation syndrome (HCC)   AKI (acute kidney injury) ([place])   Hyperlipidemia associated with type 2 diabetes mellitus (HCC)   CHF exacerbation (HCC)   Heart failure with preserved ejection fraction (HCC)   Pressure ulcer of coccygeal region DNR Goals of care counseling Uremic encephalopathy Untreated Obstructive Sleep Apnea Chronic respiratory failure   Brief Hospital Course (including significant findings, care, treatment, and services provided and events leading to death)  Patricia [name] was an 85 yo F with a relevant PMH of OSH c/b COPD and hypoxia (baseline home O2 2L) with pulmonary HTN by RHC (3/25), CKD IV, Afib, CAD w/ HFref (3/23 EF: 25-30% vs recent baseline 40-45%), s/p TAVR (09/2016), T2DM, and hx of chronic GI bleed.   On admission, patient described increased SOB on 2L O2 at home, bilateral lower extremity pitting edema, and fluctuating/elevated HR. Describes process as "long time in making". BNP 1681, CXR with bilateral pleural effusions and pulmonary  edema. Admitted to [facility] for acute heart failure exacerbation. Troponin trend at that time not c/w ACS. Got 65m lasix in ED, and total of 1423mLasix 3/23, lasix w/ metolazone, without a robust response. ECHO obtained 3/23 which revealed LVEF 25-30%, LVH, reduced RV systolic function but normal RV size. Severely dilated LA. Moderately dilated RA.   Hospital course as follows 3/24 Cr has bumped and lasix being held. Cardiology suggests PCCM consult to evaluate SOB for pulmonary etiology of SOB. 3/26: Transfer to ICU for initiation of CRRT 3/27: CRRT w/ CVHHD 3/28: continue CVVHD for kidney recover and euvolemia  3/29: weight 98.5 >> 95.7 kg, increased inotropic support required ON   3/30: weight 98.5 >> 93.8 kg, appears euvolemic, increased inotropic support required ON   3/31: TEE DCCV successful with sinus rhythm. Bradycardiac. Off vasopressor support.  4/1:  CRRT clotted multiple times. Patient refusing bipap and other care. Not wanting to eat. Palliative care consulted. Discussed with patient's daughter [name] at bedside. Unable to tolerate HD due to hypotension.  4/2 patient somnolent, lethargic, unable to participate in complex goals of care conversation. Family elected to transition to comfort measures. Transitioned to palliative care floor, inpatient hospice. Expired with family at bedside within hours.   NiLenice LlamasMD Pulmonary and [place] Pertinent Labs and Studies  Significant Diagnostic Studies DG Chest 1 View  Result Date: 01/23/2021 CLINICAL DATA:  Follow-up pulmonary edema EXAM: CHEST  1 VIEW COMPARISON:  01/22/2021 FINDINGS: Cardiac shadow remains enlarged. Changes of prior TAVR are seen. Dialysis catheter is again noted from the left jugular approach. Small bilateral pleural effusions are noted right greater than left  stable from the prior exam. Persistent vascular congestion and mild edema are noted and stable. No bony abnormality is seen.  IMPRESSION: Stable appearance of the chest when compared with the prior exam. Electronically Signed   By: Inez Catalina M.D.   On: 01/23/2021 04:02   DG Abd 1 View  Result Date: 01/23/2021 CLINICAL DATA:  Constipation EXAM: ABDOMEN - 1 VIEW COMPARISON:  CT abdomen and pelvis April 17, 2020. FINDINGS: There is moderate stool in the colon. There is mild dilatation in the proximal transverse colon region. No appreciable small bowel dilatation. No air-fluid levels. No free air or portal venous air. There are surgical clips in the gallbladder fossa region. There is aortic atherosclerotic calcification. IMPRESSION: Mild dilatation in the proximal transverse colon without air-fluid levels. No other bowel dilatation. Question a degree of enteritis or early ileus. Bowel obstruction not felt to be likely. No free air. Clips in right upper quadrant. Aortic Atherosclerosis (ICD10-I70.0). Electronically Signed   By: Lowella Grip III M.D.   On: 01/23/2021 14:24   CARDIAC CATHETERIZATION  Result Date: 01/13/2021  Hemodynamic findings consistent with severe pulmonary hypertension. Mean PAP 46-48 mmHg  Moderately reduced cardiac output of index with PA sat of 50%; CO-CI of 4.37-2.21  Patient still remains significant volume overloaded with elevated PA pressures.  Unfortunately her anatomy did not allow [place] to complete the full right heart catheterization with wedge pressure.  If absolutely indicated in the future, would consider left brachial versus IJ access.  However the patient did not seem to be interested in having another procedure. Glenetta Hew, MD  US RENAL  Result Date: 01/19/2021 CLINICAL DATA:  Acute kidney injury. EXAM: RENAL / URINARY TRACT ULTRASOUND COMPLETE COMPARISON:  Abdominal ultrasound 10/23/2020, abdominal CT 04/17/2020 FINDINGS: Right Kidney: Renal measurements: 12.2 x 5.7 x 6.1 cm = volume: 220 mL. No hydronephrosis. Mild renal parenchymal thinning and increased echogenicity. Trace  perinephric fluid about the lower pole. Hypoechoic lesion with low level internal echoes in the mid kidney measures 1.7 x 1.5 x 1.5 cm. This is unchanged in size from prior exam. Left Kidney: Renal measurements: 11.4 x 6.2 x 5.6 cm = volume: 206 mL. No hydronephrosis. Mild renal parenchymal thinning and increased parenchymal echogenicity. No focal lesion or evidence of stone. Bladder: Appears normal for degree of bladder distention. Other: None. IMPRESSION: 1. No obstructive uropathy. 2. Mild thinning of the renal parenchyma with increased echogenicity consistent with chronic medical renal disease. 3. Trace right perinephric fluid about the inferior pole. 4. Hypoechoic lesion in the right kidney is unchanged in size from prior exam measuring 1.7 cm, likely a complex/hemorrhagic cyst. Electronically Signed   By: Keith Rake M.D.   On: 12/28/2020 23:27   DG CHEST PORT 1 VIEW  Result Date: 01/22/2021 CLINICAL DATA:  Shortness of breath EXAM: PORTABLE CHEST 1 VIEW COMPARISON:  January 20, 2021 FINDINGS: Central catheter tip is at the junction of the left innominate vein and superior vena cava. No pneumothorax. There are small pleural effusions bilaterally. There is cardiomegaly with pulmonary venous hypertension. There is slight interstitial edema. There is atelectatic change in the right base with suspected associated consolidation in the right base. Patient is status post aortic valve replacement. There is aortic atherosclerosis. No adenopathy. There is a total shoulder replacement on the right. IMPRESSION: Central catheter as described without pneumothorax. There is cardiomegaly with pulmonary vascular congestion. Pleural effusions bilaterally with mild interstitial edema. Suspect a degree of underlying congestive heart failure. Opacity right base may  represent atelectasis with a degree of superimposed developing pneumonia. Aortic Atherosclerosis (ICD10-I70.0). Electronically Signed   By: Lowella Grip III  M.D.   On: 01/22/2021 12:09   DG CHEST PORT 1 VIEW  Result Date: 01/20/2021 CLINICAL DATA:  Central line placement EXAM: PORTABLE CHEST 1 VIEW COMPARISON:  01/17/2021 FINDINGS: A LEFT IJ central venous catheter is noted with tips overlying the mid-LOWER SVC. Cardiomegaly and aortic valve replacement noted. Pulmonary vascular congestion with interstitial edema and RIGHT pleural effusion again identified. There may be a trace LEFT pleural effusion present. There is no evidence of pneumothorax. IMPRESSION: LEFT IJ central venous catheter with tips overlying the mid-LOWER SVC. No other significant change. No pneumothorax. Electronically Signed   By: Margarette Canada M.D.   On: 01/20/2021 14:13   DG CHEST PORT 1 VIEW  Result Date: 01/17/2021 CLINICAL DATA:  Shortness of breath EXAM: PORTABLE CHEST 1 VIEW COMPARISON:  12/31/2020 FINDINGS: Stable cardiomegaly. Atherosclerotic calcification of the aortic knob. Small bilateral pleural effusions, right greater than left. Associated bibasilar opacities. Diffuse interstitial prominence. No pneumothorax. IMPRESSION: Cardiomegaly with small bilateral pleural effusions and mild interstitial edema, suggesting CHF with mild pulmonary edema. No appreciable interval change from prior. Electronically Signed   By: Davina Poke D.O.   On: 01/17/2021 13:14   DG Chest Portable 1 View  Result Date: 01/22/2021 CLINICAL DATA:  Shortness of breath. EXAM: PORTABLE CHEST 1 VIEW COMPARISON:  Chest x-ray 12/17/2020, CT chest 05/31/2020 FINDINGS: Cardiomegaly. The heart size and mediastinal contours are unchanged. Aortic arch calcifications. Aortic valve replacement. Prominence of the hilar vasculature. Biapical pleural/pulmonary scarring. Streaky airspace opacities at the right lower lobe. No pulmonary edema. Bilateral trace to small volume pleural effusions. No pneumothorax. No acute osseous abnormality. Partially visualized right shoulder arthroplasty. IMPRESSION: 1. Pulmonary edema  with bilateral trace to small volume pleural effusion. Superimposed infection/inflammation not excluded. 2. Cardiomegaly. Electronically Signed   By: Iven Finn M.D.   On: 01/16/2021 20:48   ECHOCARDIOGRAM COMPLETE  Result Date: 01/16/2021    ECHOCARDIOGRAM REPORT   Patient Name:   MARYON KEMNITZ Date of Exam: 01/16/2021 Medical Rec #:  [id number]      Height:       63.0 in Accession #:    [id number]     Weight:       209.0 lb Date of Birth:  [birth year]     BSA:          1.970 m Patient Age:    62 years       BP:           209/63 mmHg Patient Gender: F              HR:           107 bpm. Exam Location:  Inpatient Procedure: 2D Echo Indications:    O75.64 Acute systolic (congestive) heart failure  History:        Patient has prior history of Echocardiogram examinations, most                 recent 11/07/2019. CHF.  Sonographer:    Luisa Hart RDCS Referring Phys: [id number] [place]  1. Left ventricular ejection fraction, by estimation, is 25 to 30%. The left ventricle has severely decreased function. The left ventricle demonstrates regional wall motion abnormalities) Abnormal (paradoxical) septal motion, consistent with left bundle  branch block. There is severe left ventricular hypertrophy. Left ventricular diastolic parameters are indeterminate.  2. Right ventricular systolic function  is moderately reduced. The right ventricular size is normal. There is moderately elevated pulmonary artery systolic pressure. The estimated right ventricular systolic pressure is 80.1 mmHg.  3. Left atrial size was severely dilated.  4. Right atrial size was moderately dilated.  5. The mitral valve is abnormal. Mild to moderate mitral valve regurgitation. Severe mitral annular calcification.  6. There is a 23 mm Edwards Sapien prosthetic, stented (TAVR) valve present in the aortic position. Aortic valve regurgitation is not visualized. Aortic valve mean gradient measures 12.0 mmHg, which is reduced from prior  echo 11/07/19, likely due to decreased systolic function. Vmax 2.1 m/s, MG 12 mmHg, EOA 1.1 cm^2, DI 0.44  7. The inferior vena cava is dilated in size with <50% respiratory variability, suggesting right atrial pressure of 15 mmHg. FINDINGS  Left Ventricle: Left ventricular ejection fraction, by estimation, is 25 to 30%. The left ventricle has severely decreased function. The left ventricle demonstrates regional wall motion abnormalities. 3D left ventricular ejection fraction analysis performed but not reported based on interpreter judgement due to suboptimal quality. The left ventricular internal cavity size was normal in size. There is severe left ventricular hypertrophy. Abnormal (paradoxical) septal motion, consistent with left bundle branch block. Left ventricular diastolic parameters are indeterminate. Right Ventricle: The right ventricular size is normal. Right vetricular wall thickness was not well visualized. Right ventricular systolic function is moderately reduced. There is moderately elevated pulmonary artery systolic pressure. The tricuspid regurgitant velocity is 2.89 m/s, and with an assumed right atrial pressure of 15 mmHg, the estimated right ventricular systolic pressure is 08.1 mmHg. Left Atrium: Left atrial size was severely dilated. Right Atrium: Right atrial size was moderately dilated. Pericardium: There is no evidence of pericardial effusion. Mitral Valve: The mitral valve is abnormal. Severe mitral annular calcification. Mild to moderate mitral valve regurgitation. Tricuspid Valve: The tricuspid valve is normal in structure. Tricuspid valve regurgitation is mild. Aortic Valve: The aortic valve has been repaired/replaced. Aortic valve regurgitation is not visualized. Aortic valve mean gradient measures 12.0 mmHg. Aortic valve peak gradient measures 17.1 mmHg. Aortic valve area, by VTI measures 1.13 cm. There is a  23 mm Edwards Sapien prosthetic, stented (TAVR) valve present in the aortic  position. Pulmonic Valve: The pulmonic valve was not well visualized. Pulmonic valve regurgitation is not visualized. Aorta: The aortic root and ascending aorta are structurally normal, with no evidence of dilitation. Venous: The inferior vena cava is dilated in size with less than 50% respiratory variability, suggesting right atrial pressure of 15 mmHg. IAS/Shunts: The interatrial septum was not well visualized.  LEFT VENTRICLE PLAX 2D LVIDd:         3.90 cm LVIDs:         3.40 cm LV PW:         1.80 cm LV IVS:        2.00 cm LVOT diam:     1.80 cm LV SV:         37 LV SV Index:   19 LVOT Area:     2.54 cm  LV Volumes (MOD) LV vol d, MOD A2C: 119.0 ml LV vol d, MOD A4C: 74.5 ml LV vol s, MOD A2C: 74.1 ml LV vol s, MOD A4C: 67.8 ml LV SV MOD A2C:     44.9 ml LV SV MOD A4C:     74.5 ml LV SV MOD BP:      27.3 ml RIGHT VENTRICLE TAPSE (M-mode): 0.5 cm LEFT ATRIUM  Index        RIGHT ATRIUM           Index LA Vol (A2C):   202.0 ml 102.53 ml/m RA Area:     26.20 cm LA Vol (A4C):   139.0 ml 70.55 ml/m  RA Volume:   87.20 ml  44.26 ml/m LA Biplane Vol: 180.0 ml 91.36 ml/m  AORTIC VALVE                    PULMONIC VALVE AV Area (Vmax):    1.08 cm     PV Vmax:       0.99 m/s AV Area (Vmean):   1.00 cm     PV Vmean:      59.600 cm/s AV Area (VTI):     1.13 cm     PV VTI:        0.172 m AV Vmax:           207.00 cm/s  PV Peak grad:  3.9 mmHg AV Vmean:          156.500 cm/s PV Mean grad:  2.0 mmHg AV VTI:            0.329 m AV Peak Grad:      17.1 mmHg AV Mean Grad:      12.0 mmHg LVOT Vmax:         87.60 cm/s LVOT Vmean:        61.500 cm/s LVOT VTI:          0.146 m LVOT/AV VTI ratio: 0.44  AORTA Ao Asc diam: 2.50 cm MITRAL VALVE                 TRICUSPID VALVE MV Area (PHT): 6.27 cm      TR Peak grad:   33.4 mmHg MV Decel Time: 121 msec      TR Vmax:        289.00 cm/s MR Peak grad:    85.4 mmHg MR Mean grad:    54.0 mmHg   SHUNTS MR Vmax:         462.00 cm/s Systemic VTI:  0.15 m MR Vmean:        350.0  cm/s  Systemic Diam: 1.80 cm MR PISA:         3.08 cm MR PISA Eff ROA: 14 mm MR PISA Radius:  0.70 cm MV E velocity: 181.00 cm/s Oswaldo Milian MD Electronically signed by Oswaldo Milian MD Signature Date/Time: 01/16/2021/4:16:28 PM    Final     Microbiology Recent Results (from the past 240 hour(s))  MRSA PCR Screening     Status: None   Collection Time: 01/21/21  3:57 AM   Specimen: Nasopharyngeal  Result Value Ref Range Status   MRSA by PCR NEGATIVE NEGATIVE Final    Comment:        The GeneXpert MRSA Assay (FDA approved for NASAL specimens only), is one component of a comprehensive MRSA colonization surveillance program. It is not intended to diagnose MRSA infection nor to guide or monitor treatment for MRSA infections. Performed at [facility], [place] [address], [place], [place] [address]     Lab Basic Metabolic Panel: Recent Labs  Lab 01/21/21 0357 01/21/21 1644 01/22/21 0448 01/22/21 1600 01/23/21 0425 01/23/21 1600 01/11/2021 0338 12/29/2020 1713 01/07/2021 2030 01/25/21 0503  NA 134*   < > 134*   < > 133* 135 133* 132*  --  134*  K 3.6   < >  4.4   < > 4.6 4.7 4.7 4.8  --  4.4  CL 96*   < > 100   < > 99 100 101 100  --  103  CO2 29   < > 29   < > _0 --  27  GLUCOSE 172*   < > 205*   < > 177* 182* 162* 171*  --  178*  BUN 45*   < > 19   < > _1 --  16  CREATININE 2.03*   < > 1.61*   < > 1.51* 1.51* 1.38* 1.53*  --  1.49*  CALCIUM 8.1*   < > 8.3*   < > 8.6* 8.5* 8.4* 8.5*  --  8.1*  MG 2.1  --  2.4  --  2.4  --  2.4  --   --  2.4  PHOS 2.9   < > 2.6   < > 2.6 2.7 2.3* 1.9* 1.8* 2.9   < > = values in this interval not displayed.   Liver Function Tests: Recent Labs  Lab 01/23/21 0425 01/23/21 1600 01/02/2021 0338 01/06/2021 1713 01/25/21 0503  ALBUMIN 3.4* 3.4* 3.3* 3.5 3.4*   No results for input(s): LIPASE, AMYLASE in the last 168 hours. No results for input(s): AMMONIA in the last 168 hours. CBC: Recent Labs  Lab  01/21/21 0357 01/22/21 0448 01/23/21 0425 01/17/2021 0338 01/25/21 0804  WBC 6.4 8.1 8.0 7.1 7.2  HGB 9.3* 9.8* 10.1* 10.2* 9.7*  HCT 30.0* 31.9* 34.3* 33.9* 33.1*  MCV 93.8 95.2 97.4 98.0 96.5  PLT 144* 162 157 140* 128*   Cardiac Enzymes: No results for input(s): CKTOTAL, CKMB, CKMBINDEX, TROPONINI in the last 168 hours. Sepsis Labs: Recent Labs  Lab 01/22/21 0448 01/23/21 0425 01/22/2021 0338 01/25/21 0804  WBC 8.1 8.0 7.1 7.2

## 2021-02-24 DEATH — deceased
